# Patient Record
Sex: Female | Born: 1965 | Race: Black or African American | Hispanic: No | Marital: Married | Smoking: Former smoker
Health system: Southern US, Community
[De-identification: ages and names within clinical notes are randomized; demographics above are authoritative.]

## PROBLEM LIST (undated history)

## (undated) DIAGNOSIS — J45909 Unspecified asthma, uncomplicated: Secondary | ICD-10-CM

## (undated) DIAGNOSIS — E119 Type 2 diabetes mellitus without complications: Secondary | ICD-10-CM

## (undated) DIAGNOSIS — G4730 Sleep apnea, unspecified: Secondary | ICD-10-CM

## (undated) DIAGNOSIS — I10 Essential (primary) hypertension: Secondary | ICD-10-CM

## (undated) DIAGNOSIS — I509 Heart failure, unspecified: Secondary | ICD-10-CM

## (undated) DIAGNOSIS — R05 Cough: Secondary | ICD-10-CM

## (undated) DIAGNOSIS — Z9889 Other specified postprocedural states: Secondary | ICD-10-CM

## (undated) DIAGNOSIS — Z720 Tobacco use: Secondary | ICD-10-CM

## (undated) DIAGNOSIS — K219 Gastro-esophageal reflux disease without esophagitis: Secondary | ICD-10-CM

## (undated) DIAGNOSIS — F1410 Cocaine abuse, uncomplicated: Secondary | ICD-10-CM

## (undated) DIAGNOSIS — J189 Pneumonia, unspecified organism: Secondary | ICD-10-CM

## (undated) DIAGNOSIS — R058 Other specified cough: Secondary | ICD-10-CM

## (undated) DIAGNOSIS — Z9989 Dependence on other enabling machines and devices: Secondary | ICD-10-CM

## (undated) DIAGNOSIS — J101 Influenza due to other identified influenza virus with other respiratory manifestations: Secondary | ICD-10-CM

## (undated) DIAGNOSIS — R0600 Dyspnea, unspecified: Secondary | ICD-10-CM

## (undated) DIAGNOSIS — M1990 Unspecified osteoarthritis, unspecified site: Secondary | ICD-10-CM

## (undated) DIAGNOSIS — G7281 Critical illness myopathy: Secondary | ICD-10-CM

## (undated) DIAGNOSIS — G4733 Obstructive sleep apnea (adult) (pediatric): Secondary | ICD-10-CM

## (undated) HISTORY — DX: Essential (primary) hypertension: I10

## (undated) HISTORY — DX: Influenza due to other identified influenza virus with other respiratory manifestations: J10.1

## (undated) HISTORY — PX: LACERATION REPAIR: SHX5168

## (undated) HISTORY — DX: Critical illness myopathy: G72.81

---
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
## 2004-10-24 HISTORY — PX: TUBAL LIGATION: SHX77

## 2011-03-26 ENCOUNTER — Emergency Department (HOSPITAL_BASED_OUTPATIENT_CLINIC_OR_DEPARTMENT_OTHER)
Admission: EM | Admit: 2011-03-26 | Discharge: 2011-03-26 | Disposition: A | Discharge status: Home / Self Care | Payer: Self-pay | Attending: Emergency Medicine | Admitting: Emergency Medicine

## 2011-03-26 DIAGNOSIS — I8390 Asymptomatic varicose veins of unspecified lower extremity: Secondary | ICD-10-CM | POA: Insufficient documentation

## 2011-03-26 DIAGNOSIS — F17200 Nicotine dependence, unspecified, uncomplicated: Secondary | ICD-10-CM | POA: Insufficient documentation

## 2011-12-26 ENCOUNTER — Encounter (HOSPITAL_BASED_OUTPATIENT_CLINIC_OR_DEPARTMENT_OTHER): Payer: Self-pay | Admitting: *Deleted

## 2011-12-26 ENCOUNTER — Emergency Department (INDEPENDENT_AMBULATORY_CARE_PROVIDER_SITE_OTHER): Payer: Self-pay

## 2011-12-26 ENCOUNTER — Emergency Department (HOSPITAL_BASED_OUTPATIENT_CLINIC_OR_DEPARTMENT_OTHER)
Admission: EM | Admit: 2011-12-26 | Discharge: 2011-12-26 | Disposition: A | Discharge status: Home / Self Care | Payer: Self-pay | Attending: Emergency Medicine | Admitting: Emergency Medicine

## 2011-12-26 DIAGNOSIS — M25469 Effusion, unspecified knee: Secondary | ICD-10-CM | POA: Insufficient documentation

## 2011-12-26 DIAGNOSIS — F17200 Nicotine dependence, unspecified, uncomplicated: Secondary | ICD-10-CM | POA: Insufficient documentation

## 2011-12-26 DIAGNOSIS — M7989 Other specified soft tissue disorders: Secondary | ICD-10-CM

## 2011-12-26 DIAGNOSIS — M25569 Pain in unspecified knee: Secondary | ICD-10-CM

## 2011-12-26 DIAGNOSIS — M1710 Unilateral primary osteoarthritis, unspecified knee: Secondary | ICD-10-CM

## 2011-12-26 DIAGNOSIS — M1990 Unspecified osteoarthritis, unspecified site: Secondary | ICD-10-CM | POA: Insufficient documentation

## 2011-12-26 DIAGNOSIS — Z8739 Personal history of other diseases of the musculoskeletal system and connective tissue: Secondary | ICD-10-CM | POA: Insufficient documentation

## 2011-12-26 HISTORY — DX: Unspecified osteoarthritis, unspecified site: M19.90

## 2011-12-26 MED ORDER — ONDANSETRON 8 MG PO TBDP
8.0000 mg | ORAL_TABLET | Freq: Once | ORAL | Status: AC
  Administered 2011-12-26: 8 mg via ORAL
  Filled 2011-12-26: qty 1

## 2011-12-26 MED ORDER — HYDROCODONE-ACETAMINOPHEN 5-325 MG PO TABS
1.0000 | ORAL_TABLET | Freq: Once | ORAL | Status: AC
  Administered 2011-12-26: 1 via ORAL
  Filled 2011-12-26: qty 1

## 2011-12-26 MED ORDER — KETOROLAC TROMETHAMINE 60 MG/2ML IM SOLN
60.0000 mg | Freq: Once | INTRAMUSCULAR | Status: AC
  Administered 2011-12-26: 60 mg via INTRAMUSCULAR
  Filled 2011-12-26: qty 2

## 2011-12-26 MED ORDER — MELOXICAM 7.5 MG PO TABS: 7.5000 mg | ORAL_TABLET | Freq: Every day | ORAL | Status: DC

## 2011-12-26 MED ORDER — OXYCODONE-ACETAMINOPHEN 5-325 MG PO TABS: 1.0000 | ORAL_TABLET | Freq: Four times a day (QID) | ORAL | Status: AC | PRN

## 2011-12-26 NOTE — ED Notes (Signed)
XL Knee sleeve was too tight for patient comfort; patient was placed in ACE wrap.  Per patient request XL knee sleeve given to the patient to use at home later if needed.

## 2011-12-26 NOTE — ED Provider Notes (Signed)
History     CSN: [id number]  Arrival date & time 12/26/11  4098   First MD Initiated Contact with Patient 12/26/11 [phone]      Chief Complaint  Patient presents with  . Knee Pain    (Consider location/radiation/quality/duration/timing/severity/associated sxs/prior treatment) Patient is a 46 y.o. female presenting with knee pain. The history is provided by the patient.  Knee Pain This is a recurrent problem. Episode onset: 4 days ago. The problem occurs constantly (Pain is now an 8/10 sharp in nature radiates up the leg. It is worse in the medial part of the knee. Denies trauma). The problem has been gradually worsening. Associated symptoms comments: States she has had knee pain in the past requiring injections and it always hurts her some discomfort much worse the last 4 days with swelling. The symptoms are aggravated by walking, standing and bending. The symptoms are relieved by ice, relaxation and acetaminophen. She has tried acetaminophen for the symptoms. The treatment provided mild relief.    Past Medical History  Diagnosis Date  . Arthritis     History reviewed. No pertinent past surgical history.  History reviewed. No pertinent family history.  History  Substance Use Topics  . Smoking status: Current Everyday Smoker  . Smokeless tobacco: Never Used  . Alcohol Use: No    OB History    Grav Para Term Preterm Abortions TAB SAB Ect Mult Living                  Review of Systems  All other systems reviewed and are negative.    Allergies  Review of patient's allergies indicates no known allergies.  Home Medications   Current Outpatient Rx  Name Route Sig Dispense Refill  . ONE-DAILY MULTI VITAMINS PO TABS Oral Take 1 tablet by mouth daily.      BP 136/81  Pulse 98  Temp(Src) 97.7 F (36.5 C) (Oral)  Resp 20  SpO2 100%  LMP 12/13/2011  Physical Exam  Constitutional: She is oriented to person, place, and time. She appears well-developed and  well-nourished. No distress.  HENT:  Head: Normocephalic and atraumatic.  Eyes: EOM are normal. Pupils are equal, round, and reactive to light.  Musculoskeletal:       Right knee: She exhibits effusion. She exhibits normal range of motion, no erythema, no LCL laxity and no MCL laxity. tenderness found. Medial joint line tenderness noted.       Legs: Neurological: She is alert and oriented to person, place, and time. She has normal strength. No sensory deficit. Coordination normal.    ED Course  Procedures (including critical care time)  Labs Reviewed - No data to display Dg Knee Complete 4 Views Right  12/26/2011  *RADIOLOGY REPORT*  Clinical Data: Right knee pain and swelling.  Decreased range of motion.  RIGHT KNEE - COMPLETE 4+ VIEW  Comparison: None.  Findings: Mild lateral subluxation of the tibia with respect to the distal femur.  Prominent tricompartment osteophytosis.  The lateral views are somewhat rotated, limiting evaluation for joint effusion. There may be a small joint effusion.  Medial compartment narrowing.  IMPRESSION:  1.  No fracture. 2.  Possible small joint effusion.  Lateral views are suboptimal, as above. 3.  Moderate osteoarthritis.  Original Report Authenticated By: Reyes Ivan, M.D.     1. Osteoarthritis   2. Knee effusion       MDM   Patient with knee pain consistent with arthritis. There is a small effusion exam  medially. Patient in the past has had injections and therapy for her knee and was told at that time she did not require a replacement however she states she has increased weight and her pain is worsened particularly over the last 4 days without injury. There is no sign of septic joint exam or cellulitis. Plain film shows moderate osteoarthritis with small joint effusion which I feel is most likely the cause for her pain. Will refer her to orthopedics.       Gwyneth Sprout, MD 12/26/11 1409

## 2011-12-26 NOTE — Discharge Instructions (Signed)
Degenerative Arthritis You have osteoarthritis. This is the wear and tear arthritis that comes with aging. It is also called degenerative arthritis. This is common in people past middle age. It is caused by stress on the joints. The large weight bearing joints of the lower extremities are most often affected. The knees, hips, back, neck, and hands can become painful, swollen, and stiff. This is the most common type of arthritis. It comes on with age, carrying too much weight, or from an injury. Treatment includes resting the sore joint until the pain and swelling improve. Crutches or a walker may be needed for severe flares. Only take over-the-counter or prescription medicines for pain, discomfort, or fever as directed by your caregiver. Local heat therapy may improve motion. Cortisone shots into the joint are sometimes used to reduce pain and swelling during flares. Osteoarthritis is usually not crippling and progresses slowly. There are things you can do to decrease pain:  Avoid high impact activities.   Exercise regularly.   Low impact exercises such as walking, biking and swimming help to keep the muscles strong and keep normal joint function.   Stretching helps to keep your range of motion.   Lose weight if you are overweight. This reduces joint stress.  In severe cases when you have pain at rest or increasing disability, joint surgery may be helpful. See your caregiver for follow-up treatment as recommended.  SEEK IMMEDIATE MEDICAL CARE IF:   You have severe joint pain.   Marked swelling and redness in your joint develops.   You develop a high fever.  Document Released: 10/10/2005 Document Revised: 09/29/2011 Document Reviewed: 03/12/2007 [facility] Patient Information 2012 [place], [place].Knee Effusion The medical term for having fluid in your knee is effusion. This is often due to an internal derangement of the knee. This means something is wrong inside the knee. Some of the causes of  fluid in the knee may be torn cartilage, a torn ligament, or bleeding into the joint from an injury. Your knee is likely more difficult to bend and move. This is often because there is increased pain and pressure in the joint. The time it takes for recovery from a knee effusion depends on different factors, including:   Type of injury.   Your age.   Physical and medical conditions.   Rehabilitation Strategies.  How long you will be away from your normal activities will depend on what kind of knee problem you have and how much damage is present. Your knee has two types of cartilage. Articular cartilage covers the bone ends and lets your knee bend and move smoothly. Two menisci, thick pads of cartilage that form a rim inside the joint, help absorb shock and stabilize your knee. Ligaments bind the bones together and support your knee joint. Muscles move the joint, help support your knee, and take stress off the joint itself. CAUSES  Often an effusion in the knee is caused by an injury to one of the menisci. This is often a tear in the cartilage. Recovery after a meniscus injury depends on how much meniscus is damaged and whether you have damaged other knee tissue. Small tears may heal on their own with conservative treatment. Conservative means rest, limited weight bearing activity and muscle strengthening exercises. Your recovery may take up to 6 weeks.  TREATMENT  Larger tears may require surgery. Meniscus injuries may be treated during arthroscopy. Arthroscopy is a procedure in which your surgeon uses a small telescope like instrument to look in your  knee. Your caregiver can make a more accurate diagnosis (learning what is wrong) by performing an arthroscopic procedure. If your injury is on the inner margin of the meniscus, your surgeon may trim the meniscus back to a smooth rim. In other cases your surgeon will try to repair a damaged meniscus with stitches (sutures). This may make rehabilitation take  longer, but may provide better long term result by helping your knee keep its shock absorption capabilities. Ligaments which are completely torn usually require surgery for repair. HOME CARE INSTRUCTIONS  Use crutches as instructed.   If a brace is applied, use as directed.   Once you are home, an ice pack applied to your swollen knee may help with discomfort and help decrease swelling.   Keep your knee raised (elevated) when you are not up and around or on crutches.   Only take over-the-counter or prescription medicines for pain, discomfort, or fever as directed by your caregiver.   Your caregivers will help with instructions for rehabilitation of your knee. This often includes strengthening exercises.   You may resume a normal diet and activities as directed.  SEEK MEDICAL CARE IF:   There is increased swelling in your knee.   You notice redness, swelling, or increasing pain in your knee.   An unexplained oral temperature above 102 F (38.9 C) develops.  SEEK IMMEDIATE MEDICAL CARE IF:   You develop a rash.   You have difficulty breathing.   You have any allergic reactions from medications you may have been given.   There is severe pain with any motion of the knee.  MAKE SURE YOU:   Understand these instructions.   Will watch your condition.   Will get help right away if you are not doing well or get worse.  Document Released: 12/31/2003 Document Revised: 09/29/2011 Document Reviewed: 03/05/2008 [facility] Patient Information 2012 [place], [place].

## 2011-12-26 NOTE — ED Notes (Signed)
Pt reports having arthritis in knees but has been having trouble with her right knee for 4 days and it feels different than the arthritis unaware if she has injured it reports sharp shooting pains mainly down outside of knee painful to bend or put weight on

## 2012-05-31 ENCOUNTER — Emergency Department (HOSPITAL_BASED_OUTPATIENT_CLINIC_OR_DEPARTMENT_OTHER)
Admission: EM | Admit: 2012-05-31 | Discharge: 2012-05-31 | Disposition: A | Discharge status: Home / Self Care | Payer: Self-pay | Attending: Emergency Medicine | Admitting: Emergency Medicine

## 2012-05-31 ENCOUNTER — Encounter (HOSPITAL_BASED_OUTPATIENT_CLINIC_OR_DEPARTMENT_OTHER): Payer: Self-pay

## 2012-05-31 ENCOUNTER — Emergency Department (HOSPITAL_BASED_OUTPATIENT_CLINIC_OR_DEPARTMENT_OTHER): Payer: Self-pay

## 2012-05-31 DIAGNOSIS — M129 Arthropathy, unspecified: Secondary | ICD-10-CM | POA: Insufficient documentation

## 2012-05-31 DIAGNOSIS — W010XXA Fall on same level from slipping, tripping and stumbling without subsequent striking against object, initial encounter: Secondary | ICD-10-CM | POA: Insufficient documentation

## 2012-05-31 DIAGNOSIS — S8001XA Contusion of right knee, initial encounter: Secondary | ICD-10-CM

## 2012-05-31 DIAGNOSIS — S8000XA Contusion of unspecified knee, initial encounter: Secondary | ICD-10-CM | POA: Insufficient documentation

## 2012-05-31 DIAGNOSIS — F17200 Nicotine dependence, unspecified, uncomplicated: Secondary | ICD-10-CM | POA: Insufficient documentation

## 2012-05-31 MED ORDER — NAPROXEN 500 MG PO TABS: 500.0000 mg | ORAL_TABLET | Freq: Two times a day (BID) | ORAL | Status: DC

## 2012-05-31 MED ORDER — KETOROLAC TROMETHAMINE 60 MG/2ML IM SOLN
60.0000 mg | Freq: Once | INTRAMUSCULAR | Status: AC
  Administered 2012-05-31: 60 mg via INTRAMUSCULAR
  Filled 2012-05-31: qty 2

## 2012-05-31 MED ORDER — TRAMADOL HCL 50 MG PO TABS: 50.0000 mg | ORAL_TABLET | Freq: Four times a day (QID) | ORAL | Status: AC | PRN

## 2012-05-31 NOTE — ED Notes (Signed)
Pt to room via wheelchair. Pt wants to remain in WC. Pt given ice and ginger ale per pt request.

## 2012-05-31 NOTE — ED Provider Notes (Addendum)
History     CSN: [id number]  Arrival date & time 05/31/12  2100   First MD Initiated Contact with Patient 05/31/12 2139      Chief Complaint  Patient presents with  . Knee Injury    (Consider location/radiation/quality/duration/timing/severity/associated sxs/prior treatment) HPI Comments: The patient presents with her family members several hours after a fall when she tripped and fell onto her right knee. This was acute in onset, persistent pain, associated with mild swelling, worse with ambulation. She denies head injury neck pain, back pain.  The history is provided by the patient.    Past Medical History  Diagnosis Date  . Arthritis     History reviewed. No pertinent past surgical history.  No family history on file.  History  Substance Use Topics  . Smoking status: Current Everyday Smoker  . Smokeless tobacco: Never Used  . Alcohol Use: No    OB History    Grav Para Term Preterm Abortions TAB SAB Ect Mult Living                  Review of Systems  HENT: Negative for neck pain.   Gastrointestinal: Negative for nausea and vomiting.  Musculoskeletal: Positive for joint swelling (Right knee). Negative for back pain.  Neurological: Negative for weakness and numbness.    Allergies  Review of patient's allergies indicates no known allergies.  Home Medications   Current Outpatient Rx  Name Route Sig Dispense Refill  . NAPROXEN 500 MG PO TABS Oral Take 1 tablet (500 mg total) by mouth 2 (two) times daily with a meal. 30 tablet 0  . TRAMADOL HCL 50 MG PO TABS Oral Take 1 tablet (50 mg total) by mouth every 6 (six) hours as needed for pain. 15 tablet 0    BP 149/86  Pulse 112  Temp 97.1 F (36.2 C) (Oral)  Resp 20  Ht 5\' 5"  (1.651 m)  Wt 273 lb (123.832 kg)  BMI 45.43 kg/m2  SpO2 98%  LMP 05/30/2012  Physical Exam  Nursing note and vitals reviewed. Constitutional: She appears well-developed and well-nourished. No distress.  HENT:  Head: Normocephalic  and atraumatic.  Eyes: Conjunctivae are normal. No scleral icterus.  Cardiovascular: Normal rate, regular rhythm and intact distal pulses.   Pulmonary/Chest: Effort normal and breath sounds normal.  Musculoskeletal: She exhibits tenderness ( Tenderness over the right patella, pain with range of motion, mild effusion, normal range of motion of the ankle and hip on the right. No other injured joints ). She exhibits no edema.  Neurological: She is alert.       Normal sensation of the right lower extremity to light touch  Skin: Skin is warm and dry. No rash noted. She is not diaphoretic.    ED Course  Procedures (including critical care time)  Labs Reviewed - No data to display Dg Knee Complete 4 Views Right  05/31/2012  *RADIOLOGY REPORT*  Clinical Data: Anterior right knee pain after fall.  RIGHT KNEE - COMPLETE 4+ VIEW  Comparison: 12/26/2011  Findings: Tricompartmental degenerative changes in the right knee, most prominent in the medial compartment.  There is tricompartmental narrowing and sclerosis with prominent osteophytic change.  There is mild associated lateral subluxation of the tibia with respect to the femur, likely due to degenerative change.  No evidence of acute fracture or subluxation.  Suggestion of a small effusion.  Bone cortex and trabecular architecture appear intact. No focal bone lesion or bone destruction.  Vascular calcifications. Stable appearance  since previous study.  IMPRESSION: Prominent tricompartmental degenerative changes in the right knee with possible small effusion.  No acute fractures demonstrated.  Original Report Authenticated By: Marlon Pel, M.D.     1. Contusion of right knee       MDM  X-rays reviewed, mild effusion, no fracture, tricompartmental degenerative change present, patient informed of these results, intramuscular Toradol, home with Naprosyn and Ultram.        Vida Roller, MD 05/31/12 1610  Vida Roller, MD 05/31/12  [phone]

## 2012-05-31 NOTE — ED Notes (Signed)
MD at bedside. 

## 2012-05-31 NOTE — ED Notes (Signed)
Tripped/fell approx 2 hours PTA-pain to right knee

## 2012-06-29 ENCOUNTER — Emergency Department (HOSPITAL_BASED_OUTPATIENT_CLINIC_OR_DEPARTMENT_OTHER): Payer: No Typology Code available for payment source

## 2012-06-29 ENCOUNTER — Emergency Department (HOSPITAL_BASED_OUTPATIENT_CLINIC_OR_DEPARTMENT_OTHER)
Admission: EM | Admit: 2012-06-29 | Discharge: 2012-06-30 | Disposition: A | Discharge status: Home / Self Care | Payer: No Typology Code available for payment source | Attending: Emergency Medicine | Admitting: Emergency Medicine

## 2012-06-29 ENCOUNTER — Encounter (HOSPITAL_BASED_OUTPATIENT_CLINIC_OR_DEPARTMENT_OTHER): Payer: Self-pay | Admitting: *Deleted

## 2012-06-29 DIAGNOSIS — M129 Arthropathy, unspecified: Secondary | ICD-10-CM | POA: Insufficient documentation

## 2012-06-29 DIAGNOSIS — F17200 Nicotine dependence, unspecified, uncomplicated: Secondary | ICD-10-CM | POA: Insufficient documentation

## 2012-06-29 DIAGNOSIS — M542 Cervicalgia: Secondary | ICD-10-CM | POA: Insufficient documentation

## 2012-06-29 DIAGNOSIS — M549 Dorsalgia, unspecified: Secondary | ICD-10-CM | POA: Insufficient documentation

## 2012-06-29 DIAGNOSIS — M25569 Pain in unspecified knee: Secondary | ICD-10-CM | POA: Insufficient documentation

## 2012-06-29 MED ORDER — KETOROLAC TROMETHAMINE 60 MG/2ML IM SOLN
60.0000 mg | Freq: Once | INTRAMUSCULAR | Status: AC
  Administered 2012-06-29: 60 mg via INTRAMUSCULAR
  Filled 2012-06-29: qty 2

## 2012-06-29 MED ORDER — OXYCODONE-ACETAMINOPHEN 5-325 MG PO TABS
2.0000 | ORAL_TABLET | Freq: Once | ORAL | Status: DC
  Filled 2012-06-29: qty 2

## 2012-06-29 MED ORDER — KETOROLAC TROMETHAMINE 30 MG/ML IJ SOLN: 30.0000 mg | Freq: Once | INTRAMUSCULAR | Status: DC

## 2012-06-29 NOTE — ED Notes (Signed)
Pt c/o fall from standing on tile floor injuring right knee and lower back

## 2012-06-29 NOTE — ED Notes (Signed)
Pt here for right knee pain and low back pain s/p fall. Pt has been seen here before for injury to same knee. Pt denies any head injury. Denies LOC.

## 2012-06-29 NOTE — ED Provider Notes (Signed)
History     CSN: [id number]  Arrival date & time 06/29/12  4098   First MD Initiated Contact with Patient 06/29/12 2239      Chief Complaint  Patient presents with  . Fall    (Consider location/radiation/quality/duration/timing/severity/associated sxs/prior treatment) The history is provided by the patient and medical records.   Kathleen Rodriguez is a 46 y.o. female presents to the emergency department complaining of pain after fall.  The onset of the symptoms was  abrupt starting 4 hours ago.  The patient has associated pain in her right knee, pain in her lower back, pain in her neck.  The symptoms have been  constant, gradually worsened.  Movement and palpation makes the symptoms worse and nothing makes symptoms better.  The patient denies fever, chills, hitting her head, loss of consciousness, nausea, vomiting, diarrhea.  Patient states she slipped on the floor and her flip-flops and caught herself before falling, however she twisted her right knee and has pain in her neck and back. She states his a history of arthritis but this is not the same as her arthritis pain. She has been to the emergency department the same complaints in the past.  She describes the pain as sharp nonradiating.  She denies numbness and tingling of her feet or toes.   Past Medical History  Diagnosis Date  . Arthritis     History reviewed. No pertinent past surgical history.  History reviewed. No pertinent family history.  History  Substance Use Topics  . Smoking status: Current Everyday Smoker  . Smokeless tobacco: Never Used  . Alcohol Use: No    OB History    Grav Para Term Preterm Abortions TAB SAB Ect Mult Living                  Review of Systems  Constitutional: Negative for fever and fatigue.  HENT: Negative for neck pain and neck stiffness.   Respiratory: Negative for chest tightness and shortness of breath.   Cardiovascular: Negative for chest pain.  Gastrointestinal: Negative for nausea,  vomiting, abdominal pain and diarrhea.  Genitourinary: Negative for dysuria, urgency, frequency and hematuria.  Musculoskeletal: Positive for back pain, joint swelling (Right knee) and gait problem (secondary to pain).  Skin: Negative for rash.  Neurological: Negative for weakness, light-headedness, numbness and headaches.    Allergies  Other and Tomato  Home Medications  No current outpatient prescriptions on file.  BP 132/72  Pulse 95  Temp 98.3 F (36.8 C) (Oral)  Resp 16  Ht 5\' 6"  (1.676 m)  Wt 25 lb (11.34 kg)  BMI 4.04 kg/m2  SpO2 100%  LMP 06/24/2012  Physical Exam  Nursing note and vitals reviewed. Constitutional: She appears well-developed and well-nourished. No distress.  HENT:  Head: Normocephalic and atraumatic.  Mouth/Throat: Oropharynx is clear and moist. No oropharyngeal exudate.  Eyes: Conjunctivae and EOM are normal. Pupils are equal, round, and reactive to light. No scleral icterus.  Neck: Normal range of motion. Neck supple.       Full range of motion with pain  Cardiovascular: Normal rate, regular rhythm, normal heart sounds and intact distal pulses.  Exam reveals no gallop and no friction rub.   No murmur heard.      Capillary refill less than 3 seconds  Pulmonary/Chest: Effort normal and breath sounds normal. No respiratory distress. She has no wheezes.  Abdominal: Soft. Bowel sounds are normal. She exhibits no mass. There is no tenderness. There is no rebound and no  guarding.  Musculoskeletal: Normal range of motion. She exhibits no edema.       Decreased range of motion secondary to pain in the L-spine area  Neurological: She is alert.       Speech is clear and goal oriented, follows commands Major Cranial nerves without deficit, no facial droop Normal strength in upper and lower extremities bilaterally including dorsiflexion and plantar flexion, strong and equal grip strength Sensation normal to light and sharp touch Moves extremities without  ataxia, coordination intact Normal gait with pain   Skin: Skin is warm and dry. She is not diaphoretic.  Psychiatric: She has a normal mood and affect.    ED Course  Procedures (including critical care time)  Labs Reviewed - No data to display Dg Cervical Spine Complete  06/29/2012  *RADIOLOGY REPORT*  Clinical Data: Fall.  Neck injury and pain.  CERVICAL SPINE - COMPLETE 4+ VIEW  Comparison: None.  Findings: No evidence of acute fracture, subluxation, or prevertebral soft tissue swelling.  Moderate degenerative disc disease is seen at levels of C4-5 and C5- 6.  Mild cervical kyphosis also noted.  No other significant bone abnormality identified.  IMPRESSION:  1.  No acute findings. 2.  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   Original Report Authenticated By: Danae Orleans, M.D.    Dg Lumbar Spine Complete  06/29/2012  *RADIOLOGY REPORT*  Clinical Data: Fall.  Back injury and pain.  LUMBAR SPINE - COMPLETE 4+ VIEW  Comparison:  None.  Findings:  There is no evidence of lumbar spine fracture. Alignment is normal.  Intervertebral disc spaces are maintained.  IMPRESSION: Negative.   Original Report Authenticated By: Danae Orleans, M.D.    Dg Knee Complete 4 Views Right  06/29/2012  *RADIOLOGY REPORT*  Clinical Data: Fall.  Knee injury and pain.  RIGHT KNEE - COMPLETE 4+ VIEW  Comparison: 05/31/2012  Findings: No evidence of fracture or dislocation.  No evidence of knee joint effusion.  Moderate tricompartmental osteoarthritis shows no significant change in appearance.  No other bone lesions identified.  IMPRESSION:  1.  No acute findings. 2.  Moderate tricompartmental osteoarthritis.   Original Report Authenticated By: Danae Orleans, M.D.      1. Back pain   2. Neck pain   3. Knee pain       MDM  [name] presents with pain after a slip and fall. Neurovascularly intact.  Patient with back pain.  No neurological deficits and normal neuro exam.   Patient can walk but states is painful.  No loss of bowel or bladder control.  No concern for cauda equina.  No fever, night sweats, weight loss, h/o cancer, IVDU.  X-rays without evidence of fracture or dislocation.  Moderate tricompartmental osteoarthritis of the R knee.  RICE protocol and pain medicine indicated and discussed with patient.   1. Medications: Naprosyn, external 2. Treatment: Rest, ice, stretching 3. Follow Up: Primary care physician; emergency department if loss of bowel or bladder function or inability to walk.         Dahlia Client Olyvia Gopal, PA-C 06/30/12 1432

## 2012-06-30 MED ORDER — CYCLOBENZAPRINE HCL 10 MG PO TABS: 10.0000 mg | ORAL_TABLET | Freq: Two times a day (BID) | ORAL | Status: AC | PRN

## 2012-06-30 MED ORDER — NAPROXEN 500 MG PO TABS: 500.0000 mg | ORAL_TABLET | Freq: Two times a day (BID) | ORAL | Status: DC

## 2012-06-30 NOTE — ED Provider Notes (Signed)
Medical screening examination/treatment/procedure(s) were performed by non-physician practitioner and as supervising physician I was immediately available for consultation/collaboration.   Hanley Seamen, MD 06/30/12 [phone]

## 2012-10-26 ENCOUNTER — Encounter (HOSPITAL_BASED_OUTPATIENT_CLINIC_OR_DEPARTMENT_OTHER): Payer: Self-pay | Admitting: *Deleted

## 2012-10-26 ENCOUNTER — Emergency Department (HOSPITAL_BASED_OUTPATIENT_CLINIC_OR_DEPARTMENT_OTHER)
Admission: EM | Admit: 2012-10-26 | Discharge: 2012-10-26 | Disposition: A | Discharge status: Home / Self Care | Payer: Managed Care, Other (non HMO) | Attending: Emergency Medicine | Admitting: Emergency Medicine

## 2012-10-26 DIAGNOSIS — Z8739 Personal history of other diseases of the musculoskeletal system and connective tissue: Secondary | ICD-10-CM | POA: Insufficient documentation

## 2012-10-26 DIAGNOSIS — J45901 Unspecified asthma with (acute) exacerbation: Secondary | ICD-10-CM | POA: Insufficient documentation

## 2012-10-26 DIAGNOSIS — Z87891 Personal history of nicotine dependence: Secondary | ICD-10-CM | POA: Insufficient documentation

## 2012-10-26 DIAGNOSIS — R059 Cough, unspecified: Secondary | ICD-10-CM | POA: Insufficient documentation

## 2012-10-26 DIAGNOSIS — R05 Cough: Secondary | ICD-10-CM | POA: Insufficient documentation

## 2012-10-26 DIAGNOSIS — J069 Acute upper respiratory infection, unspecified: Secondary | ICD-10-CM | POA: Insufficient documentation

## 2012-10-26 DIAGNOSIS — Z79899 Other long term (current) drug therapy: Secondary | ICD-10-CM | POA: Insufficient documentation

## 2012-10-26 HISTORY — DX: Unspecified asthma, uncomplicated: J45.909

## 2012-10-26 MED ORDER — ALBUTEROL SULFATE (5 MG/ML) 0.5% IN NEBU: 2.5000 mg | INHALATION_SOLUTION | Freq: Once | RESPIRATORY_TRACT | Status: DC

## 2012-10-26 MED ORDER — ALBUTEROL SULFATE (5 MG/ML) 0.5% IN NEBU
INHALATION_SOLUTION | RESPIRATORY_TRACT | Status: AC
  Filled 2012-10-26: qty 1

## 2012-10-26 MED ORDER — ACETAMINOPHEN 325 MG PO TABS
ORAL_TABLET | ORAL | Status: AC
  Administered 2012-10-26: 975 mg via ORAL
  Filled 2012-10-26: qty 3

## 2012-10-26 MED ORDER — PREDNISONE 10 MG PO TABS
ORAL_TABLET | ORAL | Status: AC
  Administered 2012-10-26: 60 mg
  Filled 2012-10-26: qty 1

## 2012-10-26 MED ORDER — PREDNISONE 50 MG PO TABS: 60.0000 mg | ORAL_TABLET | Freq: Once | ORAL | Status: DC

## 2012-10-26 MED ORDER — ALBUTEROL SULFATE (5 MG/ML) 0.5% IN NEBU
5.0000 mg | INHALATION_SOLUTION | Freq: Once | RESPIRATORY_TRACT | Status: AC
  Administered 2012-10-26: 5 mg via RESPIRATORY_TRACT

## 2012-10-26 MED ORDER — ALBUTEROL SULFATE HFA 108 (90 BASE) MCG/ACT IN AERS
2.0000 | INHALATION_SPRAY | RESPIRATORY_TRACT | Status: DC | PRN
  Administered 2012-10-26: 2 via RESPIRATORY_TRACT

## 2012-10-26 MED ORDER — AEROCHAMBER PLUS FLO-VU MEDIUM MISC
1.0000 | Freq: Once | Status: DC
  Filled 2012-10-26: qty 1

## 2012-10-26 MED ORDER — ALBUTEROL SULFATE HFA 108 (90 BASE) MCG/ACT IN AERS
INHALATION_SPRAY | RESPIRATORY_TRACT | Status: AC
  Administered 2012-10-26: 2 via RESPIRATORY_TRACT
  Filled 2012-10-26: qty 6.7

## 2012-10-26 MED ORDER — ACETAMINOPHEN 325 MG PO TABS
975.0000 mg | ORAL_TABLET | Freq: Once | ORAL | Status: AC
  Administered 2012-10-26: 975 mg via ORAL

## 2012-10-26 MED ORDER — PREDNISONE 10 MG PO TABS: 40.0000 mg | ORAL_TABLET | Freq: Every day | ORAL | Status: DC

## 2012-10-26 MED ORDER — PREDNISONE 50 MG PO TABS
ORAL_TABLET | ORAL | Status: AC
  Filled 2012-10-26: qty 1

## 2012-10-26 NOTE — ED Provider Notes (Signed)
History     CSN: [id number]  Arrival date & time 10/26/12  1444   First MD Initiated Contact with Patient 10/26/12 1458      Chief Complaint  Patient presents with  . Shortness of Breath    (Consider location/radiation/quality/duration/timing/severity/associated sxs/prior treatment) HPI Complains of shortness of breath typical of asthma onset 12 hours ago also developed nonproductive cough twice 3 hours ago, no fever admits to "my face hurts when I cough" no fever no other complaints treated with ibuprofen and Tylenol, without relief. No other associated symptoms. Patient states she lost her inhaler Past Medical History  Diagnosis Date  . Arthritis   . Asthma     History reviewed. No pertinent past surgical history.  No family history on file.  History  Substance Use Topics  . Smoking status: Former Games developer  . Smokeless tobacco: Never Used  . Alcohol Use: No   quit smoking 3 weeks ago no alcohol no drugs  OB History    Grav Para Term Preterm Abortions TAB SAB Ect Mult Living                  Review of Systems  Constitutional: Negative.   HENT: Negative.        Facial pain  Respiratory: Positive for cough, shortness of breath and wheezing.   Cardiovascular: Negative.   Gastrointestinal: Negative.   Musculoskeletal: Negative.   Skin: Negative.   Neurological: Negative.   Hematological: Negative.   Psychiatric/Behavioral: Negative.   All other systems reviewed and are negative.    Allergies  Other and Tomato  Home Medications   Current Outpatient Rx  Name  Route  Sig  Dispense  Refill  . NAPROXEN 500 MG PO TABS   Oral   Take 1 tablet (500 mg total) by mouth 2 (two) times daily with a meal.   30 tablet   0     BP 129/91  Pulse 116  Temp 98.2 F (36.8 C) (Oral)  Resp 26  Wt 260 lb (117.935 kg)  SpO2 98%  Physical Exam  Nursing note and vitals reviewed. Constitutional: She appears well-developed and well-nourished. No distress.  HENT:  Head:  Normocephalic and atraumatic.  Right Ear: External ear normal.  Left Ear: External ear normal.  Mouth/Throat: Oropharynx is clear and moist. No oropharyngeal exudate.       Bilateral tympanic membranes normal oropharynx normal  Eyes: Conjunctivae normal are normal. Pupils are equal, round, and reactive to light.  Neck: Neck supple. No tracheal deviation present. No thyromegaly present.  Cardiovascular: Normal rate and regular rhythm.   No murmur heard. Pulmonary/Chest: She has wheezes.       Mildly prolonged expiratory phase, occasional cough, expiratory wheezes, speaks in paragraphs  Abdominal: Soft. Bowel sounds are normal. She exhibits no distension. There is no tenderness.       obese  Musculoskeletal: Normal range of motion. She exhibits no edema and no tenderness.  Neurological: She is alert. Coordination normal.  Skin: Skin is warm and dry. No rash noted.  Psychiatric: She has a normal mood and affect.    ED Course  Procedures (including critical care time)  Labs Reviewed - No data to display No results found.   No diagnosis found.  3:25 PM breathing improved of treatment with albuterol nebulizer. Not at baseline Continues to complain of anterior pleuritic chest pain with cough.  On exam speaks in paragraphs mild and expiratory wheezes. Tylenol ordered for pain second nebulized treatment ordered, prednisone ordered  415 p.m. breathing feels normal patient feels improved and ready for home after treatment with a second albuterol nebulizer treatment, Tylenol and prednisone  MDM  Mild tachycardia on discharge felt secondary to adrenergic effect of albuterol Suspect URI as for asthma exacerbation Plan Tylenol for aches. Albuterol inhaler with spacer to go, prescription prednisone AvoidNSAIDs while on prednisone Diagnosis #1 URI #2 asthma exacerbation        Doug Sou, MD 10/26/12 4132

## 2012-10-26 NOTE — ED Notes (Signed)
Woke at 3am with difficulty breathing. Hx of asthma. Lost her inhaler.

## 2012-12-16 ENCOUNTER — Emergency Department (HOSPITAL_BASED_OUTPATIENT_CLINIC_OR_DEPARTMENT_OTHER)
Admission: EM | Admit: 2012-12-16 | Discharge: 2012-12-16 | Disposition: A | Discharge status: Home / Self Care | Payer: Managed Care, Other (non HMO) | Attending: Emergency Medicine | Admitting: Emergency Medicine

## 2012-12-16 ENCOUNTER — Emergency Department (HOSPITAL_BASED_OUTPATIENT_CLINIC_OR_DEPARTMENT_OTHER): Payer: Managed Care, Other (non HMO)

## 2012-12-16 ENCOUNTER — Encounter (HOSPITAL_BASED_OUTPATIENT_CLINIC_OR_DEPARTMENT_OTHER): Payer: Self-pay

## 2012-12-16 DIAGNOSIS — Z79899 Other long term (current) drug therapy: Secondary | ICD-10-CM | POA: Insufficient documentation

## 2012-12-16 DIAGNOSIS — Z8739 Personal history of other diseases of the musculoskeletal system and connective tissue: Secondary | ICD-10-CM | POA: Insufficient documentation

## 2012-12-16 DIAGNOSIS — J45901 Unspecified asthma with (acute) exacerbation: Secondary | ICD-10-CM

## 2012-12-16 MED ORDER — ALBUTEROL SULFATE (5 MG/ML) 0.5% IN NEBU
5.0000 mg | INHALATION_SOLUTION | Freq: Once | RESPIRATORY_TRACT | Status: AC
  Administered 2012-12-16: 5 mg via RESPIRATORY_TRACT
  Filled 2012-12-16: qty 1

## 2012-12-16 MED ORDER — PREDNISONE 10 MG PO TABS: 20.0000 mg | ORAL_TABLET | Freq: Two times a day (BID) | ORAL | Status: DC

## 2012-12-16 MED ORDER — MAGNESIUM SULFATE 50 % IJ SOLN
2.0000 g | Freq: Once | INTRAMUSCULAR | Status: AC
  Administered 2012-12-16: 2 g via INTRAVENOUS
  Filled 2012-12-16: qty 4

## 2012-12-16 MED ORDER — ALBUTEROL SULFATE (5 MG/ML) 0.5% IN NEBU: 5.0000 mg | INHALATION_SOLUTION | Freq: Once | RESPIRATORY_TRACT | Status: DC

## 2012-12-16 MED ORDER — SODIUM CHLORIDE 0.9 % IV SOLN
Freq: Once | INTRAVENOUS | Status: AC
  Administered 2012-12-16: 15:00:00 via INTRAVENOUS

## 2012-12-16 NOTE — ED Notes (Signed)
Per Medic with [facility], pt has been sob since yesterday.  Pt states that she has been using albuterol at home, typically needs prednisone to get over episodes like this.  Has been given 10 albuterol via neb, 0.5mg  atrovent via neb, and 125 solumedrol pta by medic. 20g PIV in L AC noted upon arrival.  Pt arrived with neb in place, pt with expiratory wheezing throughout all fields, states she feels better now than when ems first arrived.

## 2012-12-16 NOTE — ED Provider Notes (Signed)
History     CSN: [id number]  Arrival date & time 12/16/12  1343   First MD Initiated Contact with Patient 12/16/12 1351      Chief Complaint  Patient presents with  . Shortness of Breath    (Consider location/radiation/quality/duration/timing/severity/associated sxs/prior treatment) HPI Comments: Patient with history of RAD.  By ems for evaluation of wheezing, shortness of breath that started yesterday and has worsened.  Not much relief with home treatments.  Was given nebs and solumedrol by ems en route.  She says that she normally gets steroids when she gets this bad.  Patient is a 47 y.o. female presenting with shortness of breath. The history is provided by the patient.  Shortness of Breath Severity:  Moderate Onset quality:  Sudden Duration:  24 hours Timing:  Constant Progression:  Worsening Chronicity:  Recurrent Context: activity and strong odors   Relieved by:  Nothing Worsened by:  Nothing tried Ineffective treatments:  Inhaler Associated symptoms: wheezing   Associated symptoms: no chest pain, no cough and no fever     Past Medical History  Diagnosis Date  . Arthritis   . Asthma     History reviewed. No pertinent past surgical history.  History reviewed. No pertinent family history.  History  Substance Use Topics  . Smoking status: Former Games developer  . Smokeless tobacco: Never Used  . Alcohol Use: No    OB History   Grav Para Term Preterm Abortions TAB SAB Ect Mult Living                  Review of Systems  Constitutional: Negative for fever.  Respiratory: Positive for shortness of breath and wheezing. Negative for cough.   Cardiovascular: Negative for chest pain.  All other systems reviewed and are negative.    Allergies  Other and Tomato  Home Medications   Current Outpatient Rx  Name  Route  Sig  Dispense  Refill  . albuterol (PROVENTIL HFA;VENTOLIN HFA) 108 (90 BASE) MCG/ACT inhaler   Inhalation   Inhale 2 puffs into the lungs every  6 (six) hours as needed for wheezing.         . predniSONE (DELTASONE) 10 MG tablet   Oral   Take 4 tablets (40 mg total) by mouth daily.   16 tablet   0   . albuterol (PROVENTIL) (5 MG/ML) 0.5% nebulizer solution   Nebulization   Take 10 mg by nebulization once.         Marland Kitchen ipratropium (ATROVENT) 0.02 % nebulizer solution   Nebulization   Take 500 mcg by nebulization once.         . methylPREDNISolone sodium succinate (SOLU-MEDROL) 125 mg/2 mL injection   Intravenous   Inject 125 mg into the vein once.         . naproxen (NAPROSYN) 500 MG tablet   Oral   Take 1 tablet (500 mg total) by mouth 2 (two) times daily with a meal.   30 tablet   0     BP 149/80  Pulse 110  Resp 26  SpO2 96%  LMP 11/15/2012  Physical Exam  Nursing note and vitals reviewed. Constitutional: She is oriented to person, place, and time. She appears well-developed and well-nourished. No distress.  HENT:  Head: Normocephalic and atraumatic.  Neck: Normal range of motion. Neck supple.  Cardiovascular: Normal rate and regular rhythm.  Exam reveals no gallop and no friction rub.   No murmur heard. Pulmonary/Chest:  There is mild respiratory  distress.  Breath sounds are equal with bilateral expiratory wheezes present.    Abdominal: Soft. Bowel sounds are normal. She exhibits no distension. There is no tenderness.  Musculoskeletal: Normal range of motion.  Neurological: She is alert and oriented to person, place, and time.  Skin: Skin is warm and dry. She is not diaphoretic.    ED Course  Procedures (including critical care time)  Labs Reviewed - No data to display No results found.   No diagnosis found.    MDM  The patient presents with an exacerbation of her asthma.  She is feeling better, sounding better, and saturating in the upper 90's after nebs, steroids, and mag.  The xray does not show an infiltrate.  Will treat with prednisone, continued home nebs.        Geoffery Lyons, MD 12/16/12 [phone]

## 2013-01-22 DIAGNOSIS — G7281 Critical illness myopathy: Secondary | ICD-10-CM

## 2013-01-22 DIAGNOSIS — J101 Influenza due to other identified influenza virus with other respiratory manifestations: Secondary | ICD-10-CM

## 2013-01-22 HISTORY — DX: Critical illness myopathy: G72.81

## 2013-01-22 HISTORY — DX: Influenza due to other identified influenza virus with other respiratory manifestations: J10.1

## 2013-01-23 ENCOUNTER — Emergency Department (HOSPITAL_BASED_OUTPATIENT_CLINIC_OR_DEPARTMENT_OTHER)
Admission: EM | Admit: 2013-01-23 | Discharge: 2013-01-23 | Disposition: A | Discharge status: Home / Self Care | Payer: 59 | Attending: Emergency Medicine | Admitting: Emergency Medicine

## 2013-01-23 ENCOUNTER — Encounter (HOSPITAL_BASED_OUTPATIENT_CLINIC_OR_DEPARTMENT_OTHER): Payer: Self-pay | Admitting: *Deleted

## 2013-01-23 DIAGNOSIS — Z87891 Personal history of nicotine dependence: Secondary | ICD-10-CM | POA: Insufficient documentation

## 2013-01-23 DIAGNOSIS — K0889 Other specified disorders of teeth and supporting structures: Secondary | ICD-10-CM

## 2013-01-23 DIAGNOSIS — Z8739 Personal history of other diseases of the musculoskeletal system and connective tissue: Secondary | ICD-10-CM | POA: Insufficient documentation

## 2013-01-23 DIAGNOSIS — K089 Disorder of teeth and supporting structures, unspecified: Secondary | ICD-10-CM | POA: Insufficient documentation

## 2013-01-23 DIAGNOSIS — J45909 Unspecified asthma, uncomplicated: Secondary | ICD-10-CM | POA: Insufficient documentation

## 2013-01-23 DIAGNOSIS — Z79899 Other long term (current) drug therapy: Secondary | ICD-10-CM | POA: Insufficient documentation

## 2013-01-23 MED ORDER — CLINDAMYCIN HCL 150 MG PO CAPS: 300.0000 mg | ORAL_CAPSULE | Freq: Four times a day (QID) | ORAL | Status: DC

## 2013-01-23 MED ORDER — BUPIVACAINE-EPINEPHRINE PF 0.5-1:200000 % IJ SOLN
10.0000 mL | Freq: Once | INTRAMUSCULAR | Status: AC
  Administered 2013-01-23: 50 mg
  Filled 2013-01-23: qty 10

## 2013-01-23 MED ORDER — BUPIVACAINE-EPINEPHRINE (PF) 0.5% -1:200000 IJ SOLN
INTRAMUSCULAR | Status: AC
  Filled 2013-01-23: qty 1.8

## 2013-01-23 MED ORDER — HYDROCODONE-ACETAMINOPHEN 5-325 MG PO TABS: 2.0000 | ORAL_TABLET | ORAL | Status: DC | PRN

## 2013-01-23 NOTE — ED Provider Notes (Signed)
Medical screening examination/treatment/procedure(s) were performed by non-physician practitioner and as supervising physician I was immediately available for consultation/collaboration.    Nelia Shi, MD 01/23/13 1426

## 2013-01-23 NOTE — ED Provider Notes (Signed)
History     CSN: [id number]  Arrival date & time 01/23/13  1302   First MD Initiated Contact with Patient 01/23/13 1335      Chief Complaint  Patient presents with  . Dental Pain    (Consider location/radiation/quality/duration/timing/severity/associated sxs/prior treatment) Patient is a 47 y.o. female presenting with tooth pain. The history is provided by the patient.  Dental PainThe primary symptoms include mouth pain and dental injury. The symptoms began more than 1 week ago. The symptoms are worsening. The symptoms occur constantly.  Additional symptoms include: gum swelling and gum tenderness.   Pt complains of a tooth that is loose and hanging.   Pt reports pain when tooth moves.  Unable to see dentist until next Monday Past Medical History  Diagnosis Date  . Arthritis   . Asthma     Past Surgical History  Procedure Laterality Date  . Tubal ligation    . Cesarean section      History reviewed. No pertinent family history.  History  Substance Use Topics  . Smoking status: Former Games developer  . Smokeless tobacco: Never Used  . Alcohol Use: No    OB History   Grav Para Term Preterm Abortions TAB SAB Ect Mult Living                  Review of Systems  All other systems reviewed and are negative.    Allergies  Other and Tomato  Home Medications   Current Outpatient Rx  Name  Route  Sig  Dispense  Refill  . albuterol (PROVENTIL HFA;VENTOLIN HFA) 108 (90 BASE) MCG/ACT inhaler   Inhalation   Inhale 2 puffs into the lungs every 6 (six) hours as needed for wheezing.         Marland Kitchen albuterol (PROVENTIL) (5 MG/ML) 0.5% nebulizer solution   Nebulization   Take 10 mg by nebulization once.         Marland Kitchen ipratropium (ATROVENT) 0.02 % nebulizer solution   Nebulization   Take 500 mcg by nebulization once.         . methylPREDNISolone sodium succinate (SOLU-MEDROL) 125 mg/2 mL injection   Intravenous   Inject 125 mg into the vein once.         . naproxen  (NAPROSYN) 500 MG tablet   Oral   Take 1 tablet (500 mg total) by mouth 2 (two) times daily with a meal.   30 tablet   0   . predniSONE (DELTASONE) 10 MG tablet   Oral   Take 4 tablets (40 mg total) by mouth daily.   16 tablet   0   . predniSONE (DELTASONE) 10 MG tablet   Oral   Take 2 tablets (20 mg total) by mouth 2 (two) times daily.   20 tablet   0     BP 143/80  Pulse 83  Temp(Src) 98.3 F (36.8 C)  Ht 5\' 7"  (1.702 m)  Wt 270 lb (122.471 kg)  BMI 42.28 kg/m2  SpO2 100%  LMP 01/23/2013  Physical Exam  Nursing note and vitals reviewed. Constitutional: She appears well-nourished.  HENT:  Head: Normocephalic.  Eyes: Pupils are equal, round, and reactive to light.  Neck: Thyromegaly present.  Musculoskeletal: Normal range of motion.  Neurological: She is alert.  Skin: Skin is warm.    ED Course  Procedures (including critical care time)  Labs Reviewed - No data to display No results found.   No diagnosis found.    MDM  Dental  block   Tooth pulled with minimal traction and gauze,  Tooth intact,    Pt advised follow up with dentist,  Clindamycin and hydrocodone         Elson Areas, PA-C 01/23/13 1425

## 2013-01-23 NOTE — ED Notes (Signed)
Pt c/o dental pain x 1 week 

## 2013-01-23 NOTE — ED Notes (Signed)
Recent dental abscess, now c/o tooth "on lower front is loose and just hanging" x 2 days.  No fever.  Also c/o pain and unable to eat due to pain.  Has taken Tylenol and IBU without relief.

## 2013-02-05 ENCOUNTER — Emergency Department (HOSPITAL_BASED_OUTPATIENT_CLINIC_OR_DEPARTMENT_OTHER): Payer: Managed Care, Other (non HMO)

## 2013-02-05 ENCOUNTER — Encounter (HOSPITAL_BASED_OUTPATIENT_CLINIC_OR_DEPARTMENT_OTHER): Payer: Self-pay | Admitting: *Deleted

## 2013-02-05 ENCOUNTER — Inpatient Hospital Stay (HOSPITAL_BASED_OUTPATIENT_CLINIC_OR_DEPARTMENT_OTHER)
Admission: EM | Admit: 2013-02-05 | Discharge: 2013-02-21 | DRG: 207 | Disposition: A | Discharge status: Home / Self Care | Payer: Managed Care, Other (non HMO) | Attending: Pulmonary Disease | Admitting: Pulmonary Disease

## 2013-02-05 DIAGNOSIS — D649 Anemia, unspecified: Secondary | ICD-10-CM | POA: Diagnosis present

## 2013-02-05 DIAGNOSIS — E872 Acidosis, unspecified: Secondary | ICD-10-CM | POA: Diagnosis present

## 2013-02-05 DIAGNOSIS — J9600 Acute respiratory failure, unspecified whether with hypoxia or hypercapnia: Principal | ICD-10-CM

## 2013-02-05 DIAGNOSIS — J45902 Unspecified asthma with status asthmaticus: Secondary | ICD-10-CM

## 2013-02-05 DIAGNOSIS — J45901 Unspecified asthma with (acute) exacerbation: Secondary | ICD-10-CM

## 2013-02-05 DIAGNOSIS — Z6841 Body Mass Index (BMI) 40.0 and over, adult: Secondary | ICD-10-CM

## 2013-02-05 DIAGNOSIS — E876 Hypokalemia: Secondary | ICD-10-CM | POA: Diagnosis present

## 2013-02-05 DIAGNOSIS — E870 Hyperosmolality and hypernatremia: Secondary | ICD-10-CM | POA: Diagnosis present

## 2013-02-05 DIAGNOSIS — R5381 Other malaise: Secondary | ICD-10-CM

## 2013-02-05 DIAGNOSIS — M129 Arthropathy, unspecified: Secondary | ICD-10-CM | POA: Diagnosis present

## 2013-02-05 DIAGNOSIS — J101 Influenza due to other identified influenza virus with other respiratory manifestations: Secondary | ICD-10-CM

## 2013-02-05 DIAGNOSIS — E440 Moderate protein-calorie malnutrition: Secondary | ICD-10-CM

## 2013-02-05 DIAGNOSIS — N189 Chronic kidney disease, unspecified: Secondary | ICD-10-CM | POA: Diagnosis present

## 2013-02-05 DIAGNOSIS — R030 Elevated blood-pressure reading, without diagnosis of hypertension: Secondary | ICD-10-CM | POA: Diagnosis present

## 2013-02-05 DIAGNOSIS — Z87891 Personal history of nicotine dependence: Secondary | ICD-10-CM

## 2013-02-05 DIAGNOSIS — IMO0002 Reserved for concepts with insufficient information to code with codable children: Secondary | ICD-10-CM | POA: Diagnosis present

## 2013-02-05 DIAGNOSIS — E46 Unspecified protein-calorie malnutrition: Secondary | ICD-10-CM | POA: Diagnosis present

## 2013-02-05 DIAGNOSIS — M6282 Rhabdomyolysis: Secondary | ICD-10-CM | POA: Diagnosis present

## 2013-02-05 DIAGNOSIS — Z79899 Other long term (current) drug therapy: Secondary | ICD-10-CM

## 2013-02-05 DIAGNOSIS — J45998 Other asthma: Secondary | ICD-10-CM | POA: Diagnosis present

## 2013-02-05 DIAGNOSIS — R748 Abnormal levels of other serum enzymes: Secondary | ICD-10-CM

## 2013-02-05 DIAGNOSIS — N179 Acute kidney failure, unspecified: Secondary | ICD-10-CM

## 2013-02-05 DIAGNOSIS — E8779 Other fluid overload: Secondary | ICD-10-CM | POA: Diagnosis present

## 2013-02-05 DIAGNOSIS — J111 Influenza due to unidentified influenza virus with other respiratory manifestations: Secondary | ICD-10-CM | POA: Diagnosis present

## 2013-02-05 DIAGNOSIS — N9989 Other postprocedural complications and disorders of genitourinary system: Secondary | ICD-10-CM | POA: Diagnosis present

## 2013-02-05 DIAGNOSIS — N170 Acute kidney failure with tubular necrosis: Secondary | ICD-10-CM | POA: Diagnosis present

## 2013-02-05 DIAGNOSIS — T50995A Adverse effect of other drugs, medicaments and biological substances, initial encounter: Secondary | ICD-10-CM | POA: Diagnosis present

## 2013-02-05 LAB — CBC WITH DIFFERENTIAL/PLATELET
Basophils Absolute: 0 10*3/uL (ref 0.0–0.1)
Basophils Relative: 0 % (ref 0–1)
Eosinophils Relative: 0 % (ref 0–5)
HCT: 38.9 % (ref 36.0–46.0)
MCHC: 34.2 g/dL (ref 30.0–36.0)
MCV: 83.5 fL (ref 78.0–100.0)
Monocytes Absolute: 1.4 10*3/uL — ABNORMAL HIGH (ref 0.1–1.0)
RDW: 15.7 % — ABNORMAL HIGH (ref 11.5–15.5)

## 2013-02-05 LAB — BASIC METABOLIC PANEL
Calcium: 8.7 mg/dL (ref 8.4–10.5)
Creatinine, Ser: 0.8 mg/dL (ref 0.50–1.10)
GFR calc Af Amer: >90 mL/min (ref >90)

## 2013-02-05 LAB — D-DIMER, QUANTITATIVE: D-Dimer, Quant: <0.27 ug/mL-FEU (ref 0.00–0.48)

## 2013-02-05 MED ORDER — SODIUM CHLORIDE 0.9 % IV BOLUS (SEPSIS)
1000.0000 mL | Freq: Once | INTRAVENOUS | Status: AC
  Administered 2013-02-05: 1000 mL via INTRAVENOUS

## 2013-02-05 MED ORDER — ALBUTEROL SULFATE (5 MG/ML) 0.5% IN NEBU
5.0000 mg | INHALATION_SOLUTION | Freq: Once | RESPIRATORY_TRACT | Status: AC
  Administered 2013-02-05: 5 mg via RESPIRATORY_TRACT

## 2013-02-05 MED ORDER — METHYLPREDNISOLONE SODIUM SUCC 125 MG IJ SOLR
60.0000 mg | INTRAMUSCULAR | Status: DC
  Administered 2013-02-06 (×6): 60 mg via INTRAVENOUS
  Filled 2013-02-05: qty 0.96
  Filled 2013-02-05: qty 2
  Filled 2013-02-05 (×8): qty 0.96

## 2013-02-05 MED ORDER — ENOXAPARIN SODIUM 40 MG/0.4ML ~~LOC~~ SOLN: 40.0000 mg | SUBCUTANEOUS | Status: DC

## 2013-02-05 MED ORDER — BIOTENE DRY MOUTH MT LIQD
15.0000 mL | Freq: Two times a day (BID) | OROMUCOSAL | Status: DC
  Administered 2013-02-05 – 2013-02-07 (×4): 15 mL via OROMUCOSAL

## 2013-02-05 MED ORDER — ALBUTEROL (5 MG/ML) CONTINUOUS INHALATION SOLN
15.0000 mg/h | INHALATION_SOLUTION | RESPIRATORY_TRACT | Status: AC
  Administered 2013-02-05: 15 mg/h via RESPIRATORY_TRACT
  Filled 2013-02-05: qty 20

## 2013-02-05 MED ORDER — PNEUMOCOCCAL VAC POLYVALENT 25 MCG/0.5ML IJ INJ: 0.5000 mL | INJECTION | INTRAMUSCULAR | Status: DC

## 2013-02-05 MED ORDER — IOHEXOL 350 MG/ML SOLN
100.0000 mL | Freq: Once | INTRAVENOUS | Status: AC | PRN
  Administered 2013-02-05: 100 mL via INTRAVENOUS

## 2013-02-05 MED ORDER — ONDANSETRON HCL 4 MG/2ML IJ SOLN
4.0000 mg | Freq: Once | INTRAMUSCULAR | Status: AC
  Administered 2013-02-05: 4 mg via INTRAVENOUS
  Filled 2013-02-05: qty 2

## 2013-02-05 MED ORDER — ALBUTEROL SULFATE (5 MG/ML) 0.5% IN NEBU
2.5000 mg | INHALATION_SOLUTION | Freq: Four times a day (QID) | RESPIRATORY_TRACT | Status: DC
  Administered 2013-02-06 (×2): 2.5 mg via RESPIRATORY_TRACT
  Filled 2013-02-05 (×2): qty 0.5

## 2013-02-05 MED ORDER — SODIUM CHLORIDE 0.9 % IJ SOLN
3.0000 mL | Freq: Two times a day (BID) | INTRAMUSCULAR | Status: DC
  Administered 2013-02-06 – 2013-02-07 (×2): 3 mL via INTRAVENOUS
  Administered 2013-02-08: 10 mL via INTRAVENOUS
  Administered 2013-02-09 – 2013-02-10 (×3): 3 mL via INTRAVENOUS

## 2013-02-05 MED ORDER — SODIUM CHLORIDE 0.9 % IV SOLN
INTRAVENOUS | Status: DC
  Administered 2013-02-06 – 2013-02-07 (×3): via INTRAVENOUS

## 2013-02-05 MED ORDER — ONDANSETRON HCL 4 MG PO TABS
4.0000 mg | ORAL_TABLET | Freq: Four times a day (QID) | ORAL | Status: DC | PRN
  Administered 2013-02-06: 4 mg via ORAL
  Filled 2013-02-05: qty 1

## 2013-02-05 MED ORDER — HYDROCODONE-ACETAMINOPHEN 5-325 MG PO TABS
2.0000 | ORAL_TABLET | ORAL | Status: DC | PRN
  Administered 2013-02-05: 2 via ORAL
  Administered 2013-02-07: 1 via ORAL
  Filled 2013-02-05: qty 2
  Filled 2013-02-05: qty 1

## 2013-02-05 MED ORDER — ONDANSETRON HCL 4 MG/2ML IJ SOLN
4.0000 mg | Freq: Four times a day (QID) | INTRAMUSCULAR | Status: DC | PRN
  Administered 2013-02-07: 4 mg via INTRAVENOUS
  Filled 2013-02-05: qty 2

## 2013-02-05 MED ORDER — ALBUTEROL SULFATE (5 MG/ML) 0.5% IN NEBU
2.5000 mg | INHALATION_SOLUTION | RESPIRATORY_TRACT | Status: DC | PRN
  Administered 2013-02-06: 2.5 mg via RESPIRATORY_TRACT

## 2013-02-05 MED ORDER — DOCUSATE SODIUM 100 MG PO CAPS
100.0000 mg | ORAL_CAPSULE | Freq: Two times a day (BID) | ORAL | Status: DC
  Administered 2013-02-06 – 2013-02-07 (×3): 100 mg via ORAL
  Filled 2013-02-05 (×5): qty 1

## 2013-02-05 MED ORDER — DEXTROSE 5 % IV SOLN
500.0000 mg | INTRAVENOUS | Status: DC
  Administered 2013-02-06 – 2013-02-07 (×2): 500 mg via INTRAVENOUS
  Filled 2013-02-05 (×3): qty 500

## 2013-02-05 MED ORDER — IPRATROPIUM BROMIDE 0.02 % IN SOLN
0.5000 mg | Freq: Once | RESPIRATORY_TRACT | Status: AC
  Administered 2013-02-05: 0.5 mg via RESPIRATORY_TRACT

## 2013-02-05 MED ORDER — METHYLPREDNISOLONE SODIUM SUCC 125 MG IJ SOLR
125.0000 mg | Freq: Once | INTRAMUSCULAR | Status: AC
  Administered 2013-02-05: 125 mg via INTRAVENOUS
  Filled 2013-02-05: qty 2

## 2013-02-05 MED ORDER — SODIUM CHLORIDE 0.9 % IV SOLN
INTRAVENOUS | Status: AC
  Administered 2013-02-05: via INTRAVENOUS

## 2013-02-05 MED ORDER — IPRATROPIUM BROMIDE 0.02 % IN SOLN
RESPIRATORY_TRACT | Status: AC
  Filled 2013-02-05: qty 2.5

## 2013-02-05 MED ORDER — ALBUTEROL SULFATE (5 MG/ML) 0.5% IN NEBU
INHALATION_SOLUTION | RESPIRATORY_TRACT | Status: AC
  Filled 2013-02-05: qty 1

## 2013-02-05 MED ORDER — CLINDAMYCIN HCL 300 MG PO CAPS
300.0000 mg | ORAL_CAPSULE | Freq: Four times a day (QID) | ORAL | Status: DC
  Administered 2013-02-06 (×2): 300 mg via ORAL
  Filled 2013-02-05 (×6): qty 1

## 2013-02-05 NOTE — ED Notes (Signed)
Resp Assessment done at 17:30

## 2013-02-05 NOTE — ED Notes (Signed)
Pt. Reports feeling bad for approx. 2 days.  Pt able to talk and is receiving nebulizer treatment at present time.  Pt. Reports multiple symptoms.  Pt. Reports cough with nausea and chest and back pain.  Pt. Has wheezing bilat.  No vomiting.  Pt. Has had EKG and is on cardiac monitor with noted ST.

## 2013-02-05 NOTE — ED Provider Notes (Signed)
History     CSN: [id number]  Arrival date & time 02/05/13  1723   First MD Initiated Contact with Patient 02/05/13 1730      Chief Complaint  Patient presents with  . Asthma    (Consider location/radiation/quality/duration/timing/severity/associated sxs/prior treatment) HPI Comments: Patient with a history of asthma presents with a two-day history of worsening cough and chest congestion with associated wheezing and shortness of breath. She states she's been using albuterol inhaler at home without improvement. She currently does not have a nebulizer machine at home. She's not on any other medications per her asthma. She states that she feels congested in her chest and is having cough productive of yellow sputum. Her wheezing and chest tightness is worsened throughout today. She has some left-sided sharp pain that it's radiating to her back and is worse with coughing and inspiration. She denies any leg swelling or calf tenderness. She denies any other risk factors for PTE. She denies a history of heart problems in the past. She denies he fevers or chills. She's had some nausea but no vomiting  Patient is a 47 y.o. female presenting with asthma.  Asthma Associated symptoms include chest pain and shortness of breath. Pertinent negatives include no abdominal pain and no headaches.    Past Medical History  Diagnosis Date  . Arthritis   . Asthma     Past Surgical History  Procedure Laterality Date  . Tubal ligation    . Cesarean section      History reviewed. No pertinent family history.  History  Substance Use Topics  . Smoking status: Former Games developer  . Smokeless tobacco: Never Used  . Alcohol Use: No    OB History   Grav Para Term Preterm Abortions TAB SAB Ect Mult Living                  Review of Systems  Constitutional: Positive for fatigue. Negative for fever, chills and diaphoresis.  HENT: Positive for congestion, rhinorrhea and sneezing.   Eyes: Negative.    Respiratory: Positive for cough, shortness of breath and wheezing. Negative for chest tightness.   Cardiovascular: Positive for chest pain. Negative for leg swelling.  Gastrointestinal: Positive for nausea. Negative for vomiting, abdominal pain, diarrhea and blood in stool.  Genitourinary: Negative for frequency, hematuria, flank pain and difficulty urinating.  Musculoskeletal: Negative for back pain and arthralgias.  Skin: Negative for rash.  Neurological: Negative for dizziness, speech difficulty, weakness, numbness and headaches.    Allergies  Other and Tomato  Home Medications   Current Outpatient Rx  Name  Route  Sig  Dispense  Refill  . albuterol (PROVENTIL HFA;VENTOLIN HFA) 108 (90 BASE) MCG/ACT inhaler   Inhalation   Inhale 2 puffs into the lungs every 6 (six) hours as needed for wheezing.         Marland Kitchen albuterol (PROVENTIL) (5 MG/ML) 0.5% nebulizer solution   Nebulization   Take 10 mg by nebulization once.         . clindamycin (CLEOCIN) 150 MG capsule   Oral   Take 2 capsules (300 mg total) by mouth every 6 (six) hours.   28 capsule   0   . HYDROcodone-acetaminophen (NORCO/VICODIN) 5-325 MG per tablet   Oral   Take 2 tablets by mouth every 4 (four) hours as needed for pain.   10 tablet   0   . ipratropium (ATROVENT) 0.02 % nebulizer solution   Nebulization   Take 500 mcg by nebulization once.         Marland Kitchen  methylPREDNISolone sodium succinate (SOLU-MEDROL) 125 mg/2 mL injection   Intravenous   Inject 125 mg into the vein once.         . naproxen (NAPROSYN) 500 MG tablet   Oral   Take 1 tablet (500 mg total) by mouth 2 (two) times daily with a meal.   30 tablet   0   . predniSONE (DELTASONE) 10 MG tablet   Oral   Take 4 tablets (40 mg total) by mouth daily.   16 tablet   0   . predniSONE (DELTASONE) 10 MG tablet   Oral   Take 2 tablets (20 mg total) by mouth 2 (two) times daily.   20 tablet   0     BP 135/69  Pulse 128  Temp(Src) 98.6 F (37  C) (Oral)  Resp 18  SpO2 94%  LMP 01/23/2013  Physical Exam  Constitutional: She is oriented to person, place, and time. She appears well-developed and well-nourished. She appears distressed.  Patient tachypnea and increased work of breathing. She's talking in short sentences.  HENT:  Head: Normocephalic and atraumatic.  Eyes: Pupils are equal, round, and reactive to light.  Neck: Normal range of motion. Neck supple.  Cardiovascular: Normal rate, regular rhythm and normal heart sounds.   Pulmonary/Chest: She is in respiratory distress. She has wheezes. She has no rales. She exhibits tenderness (Left-sided chest tenderness is reproducible on palpation).  Abdominal: Soft. Bowel sounds are normal. There is no tenderness. There is no rebound and no guarding.  Musculoskeletal: Normal range of motion. She exhibits no edema.  No calf tenderness  Lymphadenopathy:    She has no cervical adenopathy.  Neurological: She is alert and oriented to person, place, and time.  Skin: Skin is warm and dry. No rash noted.  Psychiatric: She has a normal mood and affect.    ED Course  Procedures (including critical care time)  Results for orders placed during the hospital encounter of 02/05/13  CBC WITH DIFFERENTIAL      Result Value Range   WBC 5.7  4.0 - 10.5 K/uL   RBC 4.66  3.87 - 5.11 MIL/uL   Hemoglobin 13.3  12.0 - 15.0 g/dL   HCT 16.1  09.6 - 04.5 %   MCV 83.5  78.0 - 100.0 fL   MCH 28.5  26.0 - 34.0 pg   MCHC 34.2  30.0 - 36.0 g/dL   RDW 40.9 (*) 81.1 - 91.4 %   Platelets 248  150 - 400 K/uL   Neutrophils Relative 66  43 - 77 %   Neutro Abs 3.7  1.7 - 7.7 K/uL   Lymphocytes Relative 9 (*) 12 - 46 %   Lymphs Abs 0.5 (*) 0.7 - 4.0 K/uL   Monocytes Relative 25 (*) 3 - 12 %   Monocytes Absolute 1.4 (*) 0.1 - 1.0 K/uL   Eosinophils Relative 0  0 - 5 %   Eosinophils Absolute 0.0  0.0 - 0.7 K/uL   Basophils Relative 0  0 - 1 %   Basophils Absolute 0.0  0.0 - 0.1 K/uL  BASIC METABOLIC  PANEL      Result Value Range   Sodium 134 (*) 135 - 145 mEq/L   Potassium 3.7  3.5 - 5.1 mEq/L   Chloride 100  96 - 112 mEq/L   CO2 22  19 - 32 mEq/L   Glucose, Bld 103 (*) 70 - 99 mg/dL   BUN 11  6 - 23 mg/dL  Creatinine, Ser 0.80  0.50 - 1.10 mg/dL   Calcium 8.7  8.4 - 16.1 mg/dL   GFR calc non Af Amer 86 (*) >90 mL/min   GFR calc Af Amer >90  >90 mL/min  D-DIMER, QUANTITATIVE      Result Value Range   D-Dimer, Quant <0.27  0.00 - 0.48 ug/mL-FEU  TROPONIN I      Result Value Range   Troponin I <0.30  <0.30 ng/mL   Dg Chest 2 View  02/05/2013  *RADIOLOGY REPORT*  Clinical Data: Asthma, cough, shortness of breath  CHEST - 2 VIEW  Comparison: 12/16/2012  Findings: Cardiomediastinal silhouette is stable.  No acute infiltrate or pleural effusion.  No pulmonary edema.  Stable central mild bronchitic changes.  IMPRESSION: No active disease.  No significant change.   Original Report Authenticated By: Natasha Mead, M.D.    Ct Angio Chest Pe W/cm &/or Wo Cm  02/05/2013  *RADIOLOGY REPORT*  Clinical Data: 47 year old female with chest pain shortness of breath hypoxia.  Left side pain.  CT ANGIOGRAPHY CHEST  Technique:  Multidetector CT imaging of the chest using the standard protocol during bolus administration of intravenous contrast. Multiplanar reconstructed images including MIPs were obtained and reviewed to evaluate the vascular anatomy.  Contrast: OMNIPAQUE IOHEXOL 350 MG/ML SOLN  Comparison: chest radiograph from the same day and earlier.  Findings: Adequate contrast bolus timing in the pulmonary arterial tree.  No focal filling defect identified in the pulmonary arterial tree to suggest the presence of acute pulmonary embolism.  No pleural or pericardial effusion.  Negative visualized upper abdominal viscera.  Negative visualized aorta.  Coarsely calcified left lower pole thyroid nodule measuring 8 mm, significance doubtful.  No mediastinal lymphadenopathy.  Major airways are patent.  The  lungs are clear.  No acute osseous abnormality identified.  IMPRESSION: No evidence of acute pulmonary embolus.  Negative chest CTA.   Original Report Authenticated By: Erskine Speed, M.D.        Date: 02/05/2013  Rate: 119  Rhythm: sinus tachycardia  QRS Axis: normal  Intervals: normal  ST/T Wave abnormalities: normal  Conduction Disutrbances:none  Narrative Interpretation:   Old EKG Reviewed: none available   1. Asthma exacerbation       MDM  Patient was given Solu-Medrol a DuoNeb treatment and continuous nebs for an hour. She's doing better and she feels like her breathing is improved however her oxygen saturations are still deviated 89% on room air. She was placed on a nasal cannula we will continue to observe for a short period time and if she's not improving will go ahead and admit her for observation. Given her left-sided pleuritic pain with associated hypoxia we'll go ahead and do a CT chest rule out PE  I discussed the patient with Dr. Houston Siren who has accepted patient for transfer to [facility]. There is no evidence of pulmonary embolus.  CRITICAL CARE Performed by: Madysen Faircloth   Total critical care time: 60  Critical care time was exclusive of separately billable procedures and treating other patients.  Critical care was necessary to treat or prevent imminent or life-threatening deterioration.  Critical care was time spent personally by me on the following activities: development of treatment plan with patient and/or surrogate as well as nursing, discussions with consultants, evaluation of patient's response to treatment, examination of patient, obtaining history from patient or surrogate, ordering and performing treatments and interventions, ordering and review of laboratory studies, ordering and review of radiographic studies,  pulse oximetry and re-evaluation of patient's condition.      Rolan Bucco, MD 02/05/13 2111

## 2013-02-05 NOTE — ED Notes (Signed)
Asthma onset today states has had cough and chest congestion for 2 days states son has the flu

## 2013-02-05 NOTE — ED Notes (Signed)
Encouraged Pt. To stay in her bed due to shortness of breath that she could fall getting  Up and down.

## 2013-02-05 NOTE — ED Notes (Signed)
MD at bedside. 

## 2013-02-05 NOTE — Progress Notes (Signed)
PENDING ACCEPTANCE TRANFER NOTE:  Call received from:  Dr Fredderick Phenix of [facility].  REASON FOR REQUESTING TRANSFER:  Asthma exacerbation needing continual tx. HPI:   47 yo with asthma presents to [facility] with asthma exacerbation, tx with nebs and IV solumedrol.  Stable and better but still a little hypoxia with sat 88% and having wheezing.     PLAN:  According to telephone report, this patient was accepted for transfer to telemetry,   Under [facility] team: [id number],  I have requested an order be written to call Flow Manager at [phone] upon patient arrival to the floor for final physician assignment who will do the admission and give admitting orders.  SIGNEDHouston Siren, MD [facility]  02/05/2013, 9:09 PM

## 2013-02-05 NOTE — Progress Notes (Signed)
Placed patient on 2 liter nasal cannula to maintain SPO2 > 88%.  Current SPO2 on cannula is 94%

## 2013-02-05 NOTE — ED Notes (Signed)
Patient transported to CT 

## 2013-02-06 LAB — INFLUENZA PANEL BY PCR (TYPE A & B)
H1N1 flu by pcr: NOT DETECTED
Influenza A By PCR: NEGATIVE
Influenza B By PCR: POSITIVE — AB

## 2013-02-06 MED ORDER — ACETAMINOPHEN 325 MG PO TABS
650.0000 mg | ORAL_TABLET | Freq: Four times a day (QID) | ORAL | Status: DC | PRN
  Administered 2013-02-06: 650 mg via ORAL
  Filled 2013-02-06: qty 2

## 2013-02-06 MED ORDER — LEVALBUTEROL HCL 0.63 MG/3ML IN NEBU
0.6300 mg | INHALATION_SOLUTION | RESPIRATORY_TRACT | Status: DC | PRN
  Administered 2013-02-07 (×2): 0.63 mg via RESPIRATORY_TRACT
  Filled 2013-02-06: qty 3

## 2013-02-06 MED ORDER — LEVALBUTEROL HCL 0.63 MG/3ML IN NEBU
0.6300 mg | INHALATION_SOLUTION | Freq: Four times a day (QID) | RESPIRATORY_TRACT | Status: DC
  Administered 2013-02-06 – 2013-02-07 (×2): 0.63 mg via RESPIRATORY_TRACT
  Filled 2013-02-06 (×10): qty 3

## 2013-02-06 MED ORDER — PANTOPRAZOLE SODIUM 40 MG PO TBEC
40.0000 mg | DELAYED_RELEASE_TABLET | Freq: Every day | ORAL | Status: DC
  Administered 2013-02-06 – 2013-02-07 (×2): 40 mg via ORAL
  Filled 2013-02-06 (×3): qty 1

## 2013-02-06 MED ORDER — MAGNESIUM SULFATE 40 MG/ML IJ SOLN
2.0000 g | Freq: Once | INTRAMUSCULAR | Status: AC
  Administered 2013-02-06: 2 g via INTRAVENOUS
  Filled 2013-02-06 (×2): qty 50

## 2013-02-06 MED ORDER — METHYLPREDNISOLONE SODIUM SUCC 125 MG IJ SOLR: 125.0000 mg | Freq: Four times a day (QID) | INTRAMUSCULAR | Status: DC

## 2013-02-06 MED ORDER — METHYLPREDNISOLONE SODIUM SUCC 125 MG IJ SOLR
125.0000 mg | Freq: Four times a day (QID) | INTRAMUSCULAR | Status: DC
  Administered 2013-02-07 (×2): 125 mg via INTRAVENOUS
  Filled 2013-02-06 (×7): qty 2

## 2013-02-06 MED ORDER — ALBUTEROL SULFATE (5 MG/ML) 0.5% IN NEBU
2.5000 mg | INHALATION_SOLUTION | RESPIRATORY_TRACT | Status: DC
  Administered 2013-02-06 (×3): 2.5 mg via RESPIRATORY_TRACT
  Filled 2013-02-06: qty 0.5
  Filled 2013-02-06: qty 1
  Filled 2013-02-06: qty 0.5

## 2013-02-06 MED ORDER — OSELTAMIVIR PHOSPHATE 75 MG PO CAPS
75.0000 mg | ORAL_CAPSULE | Freq: Two times a day (BID) | ORAL | Status: DC
  Administered 2013-02-06 – 2013-02-07 (×4): 75 mg via ORAL
  Filled 2013-02-06 (×8): qty 1

## 2013-02-06 NOTE — Progress Notes (Signed)
   CARE MANAGEMENT NOTE 02/06/2013  Patient:  Kathleen Rodriguez, Kathleen Rodriguez   Account Number:  [id number]  Date Initiated:  02/06/2013  Documentation initiated by:  [facility]  Subjective/Objective Assessment:   shortness of breath,     Action/Plan:   lives with husband   Anticipated DC Date:  02/08/2013   Anticipated DC Plan:  HOME/SELF CARE      DC Planning Services  CM consult  Medication Assistance      Choice offered to / List presented to:             Status of service:  In process, will continue to follow Medicare Important Message given?   (If response is "NO", the following Medicare IM given date fields will be blank) Date Medicare IM given:   Date Additional Medicare IM given:    Discharge Disposition:    Per UR Regulation:    If discussed at Long Length of Stay Meetings, dates discussed:    Comments:  02/06/2013 1045 NCM spoke to pt and states she has insurance with [employer]. Gave permisison to call husband for info. Info sent for benefits check. If pt needs assistance with meds, will use MATCH program and arrange appt with [facility] for follow up post dc.  Kathleen Donning RN CCM Case Mgmt phone [phone]  Raynie Steinhaus, husband # [phone]

## 2013-02-06 NOTE — Progress Notes (Addendum)
Utilization review complete. Trentyn Boisclair RN CCM Case Mgmt phone [phone] 

## 2013-02-06 NOTE — Progress Notes (Signed)
RT removed patient from bipap and placed patient on venturi mask 40%. Patient sat is 97%. Patient tolerating venturi mask well at this time. RT will continue monitor.

## 2013-02-06 NOTE — Progress Notes (Signed)
eLink Physician-Brief Progress Note Patient Name: Kathleen Rodriguez DOB: August 18, 1966 MRN: [id number]  Date of Service  02/06/2013   HPI/Events of Note  Best Practice  eICU Interventions  SCDs ordered while in bed.  May be d/ced when fully ambulatory   Intervention Category Intermediate Interventions: Best-practice therapies (e.g. DVT, beta blocker, etc.)  DETERDING,ELIZABETH 02/06/2013, 11:49 PM

## 2013-02-06 NOTE — Progress Notes (Signed)
Pt became more SOB with increased work of breathing. RT was at bedside & RR & on call NP were called. Both arrived to the floor to assess pt and new orders were placed. Pt was placed on bipap & was transferred to 3301. Husband was notified.

## 2013-02-06 NOTE — Progress Notes (Signed)
Subjective: Kathleen Rodriguez was admitted to [facility] yesterday with asthma exacerbation. This NP was called by RN and rapid response to come to 5500 2/2 pt having increased SOB and WOB. Lungs tight with wheezing. Kathleen Rodriguez said her SOB "comes and goes" and last episode like this one was around 1500 hrs today. She was able to breathe slower and breathing improved. She says this episode started about 30 mins ago when she just became very SOB and tight with some air hunger. Resp therapy at bedside and pt was placed on Bipap and transferred to 3300 with hopes of removing bipap shortly. But, pt still warrants closer watching for the night. Solumedrol 60mg  IV extra dose given and pt's dose increased to 125mg . To have xopenex tx's now as Albuterol made her tachy. She is already feeling better on the Bipap as we arrive in 3300.   Objective: Vital signs in last 24 hours: Temp:  [97.8 F (36.6 C)-98.3 F (36.8 C)] 98.1 F (36.7 C) (04/16 1445) Pulse Rate:  [91-109] 109 (04/16 1445) Resp:  [20] 20 (04/16 1445) BP: (122-136)/(79-82) 123/82 mmHg (04/16 1445) SpO2:  [89 %-96 %] 89 % (04/16 1445) FiO2 (%):  [32 %-36 %] 36 % (04/16 0301) Weight:  [120.6 kg (265 lb 14 oz)] 120.6 kg (265 lb 14 oz) (04/15 2301) Weight change:  Last BM Date: 02/05/13  Intake/Output from previous day: 04/15 0701 - 04/16 0700 In: 806.3 [I.V.:806.3] Out: -  Intake/Output this shift:    Gen: obese AAF in acute resp distress with increased WOB Resp: as above and tight with wheezing throughout.  Card: S1S2, slightly tachy, SR Neuro: alert and oriented. Responds appropriately.   Lab Results:  Recent Labs  02/05/13 1740  WBC 5.7  HGB 13.3  HCT 38.9  PLT 248   BMET  Recent Labs  02/05/13 1740  NA 134*  K 3.7  CL 100  CO2 22  GLUCOSE 103*  BUN 11  CREATININE 0.80  CALCIUM 8.7    Studies/Results: Dg Chest 2 View  02/05/2013  *RADIOLOGY REPORT*  Clinical Data: Asthma, cough, shortness of breath  CHEST - 2 VIEW   Comparison: 12/16/2012  Findings: Cardiomediastinal silhouette is stable.  No acute infiltrate or pleural effusion.  No pulmonary edema.  Stable central mild bronchitic changes.  IMPRESSION: No active disease.  No significant change.   Original Report Authenticated By: Natasha Mead, M.D.    Ct Angio Chest Pe W/cm &/or Wo Cm  02/05/2013  *RADIOLOGY REPORT*  Clinical Data: 47 year old female with chest pain shortness of breath hypoxia.  Left side pain.  CT ANGIOGRAPHY CHEST  Technique:  Multidetector CT imaging of the chest using the standard protocol during bolus administration of intravenous contrast. Multiplanar reconstructed images including MIPs were obtained and reviewed to evaluate the vascular anatomy.  Contrast: OMNIPAQUE IOHEXOL 350 MG/ML SOLN  Comparison: chest radiograph from the same day and earlier.  Findings: Adequate contrast bolus timing in the pulmonary arterial tree.  No focal filling defect identified in the pulmonary arterial tree to suggest the presence of acute pulmonary embolism.  No pleural or pericardial effusion.  Negative visualized upper abdominal viscera.  Negative visualized aorta.  Coarsely calcified left lower pole thyroid nodule measuring 8 mm, significance doubtful.  No mediastinal lymphadenopathy.  Major airways are patent.  The lungs are clear.  No acute osseous abnormality identified.  IMPRESSION: No evidence of acute pulmonary embolus.  Negative chest CTA.   Original Report Authenticated By: Erskine Speed, M.D.  Medications: reviewed  Assessment/Plan: 1. Asthma exacerbation with acute resp distress. Solumedrol increased. Xopenex treatments substituted for Albuterol and increased in frequency. Will wean off Bipap as soon as possible. 2. Flu precautions, cont antibiotics.  Leave in SDU overnight for closer monitoring.  CT chest neg today. Will cont to follow.   LOS: 1 day   Jimmye Norman 02/06/2013, 9:37 PM

## 2013-02-06 NOTE — Progress Notes (Signed)
Addendum  Patient seen and examined, chart and data base reviewed.  I agree with the above assessment and plan.  For full details please see Mrs. Algis Downs PA note.  Severe asthma exacerbation, questionable influenza, patient reported that she had a son with flu at home.   Clint Lipps, MD [facility] Pager: [phone] 02/06/2013, 12:46 PM

## 2013-02-06 NOTE — H&P (Signed)
[facility] History and Physical  Kathleen Rodriguez BJY:[id number] DOB: 1966-10-15    PCP:   No primary provider on file.   Chief Complaint: Asthma exacerbation.  HPI: Kathleen Rodriguez is an 47 y.o. female with history of asthma, arthritis, obesity, presents to meds and the [facility] complaining of shortness of breath. She has significant wheezing at that time and was in mild-to-moderate respiratory distress. She admitted to having brown sputum productive cough as well. She was given nebulizer treatments along with intravenous steroid, and subsequently improved. Unfortunately she still have an wheezing and dyspnea on exertion. She also had a negative CT pulmonary angiogram along with a chest x-ray showed no acute cardiopulmonary pathology. Her serology was also unremarkable as well. Hospitalist was asked to accept patient in transfer for further treatment of asthmatic bronchitis with acute exacerbation.  Rewiew of Systems:  Constitutional: Negative for malaise, fever and chills. No significant weight loss or weight gain Eyes: Negative for eye pain, redness and discharge, diplopia, visual changes, or flashes of light. ENMT: Negative for ear pain, hoarseness, nasal congestion, sinus pressure and sore throat. No headaches; tinnitus, drooling, or problem swallowing. Cardiovascular: Negative for chest pain, palpitations, diaphoresis,and peripheral edema. ; No orthopnea, PND Respiratory: Negative for  hemoptysis,  and stridor. No pleuritic chestpain. Gastrointestinal: Negative for nausea, vomiting, diarrhea, constipation, abdominal pain, melena, blood in stool, hematemesis, jaundice and rectal bleeding.    Genitourinary: Negative for frequency, dysuria, incontinence,flank pain and hematuria; Musculoskeletal: Negative for back pain and neck pain. Negative for swelling and trauma.;  Skin: . Negative for pruritus, rash, abrasions, bruising and skin lesion.; ulcerations Neuro: Negative for headache,  lightheadedness and neck stiffness. Negative for weakness, altered level of consciousness , altered mental status, extremity weakness, burning feet, involuntary movement, seizure and syncope.  Psych: negative for anxiety, depression, insomnia, tearfulness, panic attacks, hallucinations, paranoia, suicidal or homicidal ideation    Past Medical History  Diagnosis Date  . Arthritis   . Asthma     Past Surgical History  Procedure Laterality Date  . Tubal ligation    . Cesarean section      Medications:  HOME MEDS: Prior to Admission medications   Medication Sig Start Date End Date Taking? Authorizing Provider  albuterol (PROVENTIL HFA;VENTOLIN HFA) 108 (90 BASE) MCG/ACT inhaler Inhale 2 puffs into the lungs every 6 (six) hours as needed for wheezing.    Historical Provider, MD  albuterol (PROVENTIL) (5 MG/ML) 0.5% nebulizer solution Take 10 mg by nebulization once.    Historical Provider, MD  clindamycin (CLEOCIN) 150 MG capsule Take 2 capsules (300 mg total) by mouth every 6 (six) hours. 01/23/13   Elson Areas, PA-C  HYDROcodone-acetaminophen (NORCO/VICODIN) 5-325 MG per tablet Take 2 tablets by mouth every 4 (four) hours as needed for pain. 01/23/13   Elson Areas, PA-C  ipratropium (ATROVENT) 0.02 % nebulizer solution Take 500 mcg by nebulization once.    Historical Provider, MD  methylPREDNISolone sodium succinate (SOLU-MEDROL) 125 mg/2 mL injection Inject 125 mg into the vein once.    Historical Provider, MD  naproxen (NAPROSYN) 500 MG tablet Take 1 tablet (500 mg total) by mouth 2 (two) times daily with a meal. 06/30/12 06/30/13  Hannah Muthersbaugh, PA-C  predniSONE (DELTASONE) 10 MG tablet Take 4 tablets (40 mg total) by mouth daily. 10/27/12   Doug Sou, MD  predniSONE (DELTASONE) 10 MG tablet Take 2 tablets (20 mg total) by mouth 2 (two) times daily. 12/16/12   Geoffery Lyons, MD  Allergies:  Allergies  Allergen Reactions  . Other Hives and Itching    Ketchup  . Tomato  Hives and Itching    Social History:   reports that she has quit smoking. She has never used smokeless tobacco. She reports that she does not drink alcohol or use illicit drugs.  Family History: History reviewed. No pertinent family history.   Physical Exam: Filed Vitals:   02/05/13 2301 02/05/13 2324 02/05/13 2329 02/06/13 0301  BP:   136/79   Pulse:   105   Temp:   98.3 F (36.8 C)   TempSrc:   Oral   Resp:   20   Height: 5\' 6"  (1.676 m)     Weight: 120.6 kg (265 lb 14 oz)     SpO2:  92% 91% 93%   Blood pressure 136/79, pulse 105, temperature 98.3 F (36.8 C), temperature source Oral, resp. rate 20, height 5\' 6"  (1.676 m), weight 120.6 kg (265 lb 14 oz), last menstrual period 01/23/2013, SpO2 93.00%.  GEN:  Pleasant  patient lying in the stretcher in no acute distress; cooperative with exam. Able to complete sentences PSYCH:  alert and oriented x4; does not appear anxious or depressed; affect is appropriate. HEENT: Mucous membranes pink and anicteric; PERRLA; EOM intact; no cervical lymphadenopathy nor thyromegaly or carotid bruit; no JVD; There were no stridor. Neck is very supple. Breasts:: Not examined CHEST WALL: No tenderness CHEST: Normal respiration, she has bilateral inspiratory and expiratory wheezes HEART: Regular rate and rhythm.  There are no murmur, rub, or gallops.   BACK: No kyphosis or scoliosis; no CVA tenderness ABDOMEN: soft and non-tender; no masses, no organomegaly, normal abdominal bowel sounds; no pannus; no intertriginous candida. There is no rebound and no distention. Rectal Exam: Not done EXTREMITIES: No bone or joint deformity; age-appropriate arthropathy of the hands and knees; no edema; no ulcerations.  There is no calf tenderness. Genitalia: not examined PULSES: 2+ and symmetric SKIN: Normal hydration no rash or ulceration CNS: Cranial nerves 2-12 grossly intact no focal lateralizing neurologic deficit.  Speech is fluent; uvula elevated with  phonation, facial symmetry and tongue midline. DTR are normal bilaterally, cerebella exam is intact, barbinski is negative and strengths are equaled bilaterally.  No sensory loss.   Labs on Admission:  Basic Metabolic Panel:  Recent Labs Lab 02/05/13 1740  NA 134*  K 3.7  CL 100  CO2 22  GLUCOSE 103*  BUN 11  CREATININE 0.80  CALCIUM 8.7   Liver Function Tests: No results found for this basename: AST, ALT, ALKPHOS, BILITOT, PROT, ALBUMIN,  in the last 168 hours No results found for this basename: LIPASE, AMYLASE,  in the last 168 hours No results found for this basename: AMMONIA,  in the last 168 hours CBC:  Recent Labs Lab 02/05/13 1740  WBC 5.7  NEUTROABS 3.7  HGB 13.3  HCT 38.9  MCV 83.5  PLT 248   Cardiac Enzymes:  Recent Labs Lab 02/05/13 1740  TROPONINI <0.30    CBG: No results found for this basename: GLUCAP,  in the last 168 hours   Radiological Exams on Admission: Dg Chest 2 View  02/05/2013  *RADIOLOGY REPORT*  Clinical Data: Asthma, cough, shortness of breath  CHEST - 2 VIEW  Comparison: 12/16/2012  Findings: Cardiomediastinal silhouette is stable.  No acute infiltrate or pleural effusion.  No pulmonary edema.  Stable central mild bronchitic changes.  IMPRESSION: No active disease.  No significant change.   Original Report Authenticated By:  Natasha Mead, M.D.    Ct Angio Chest Pe W/cm &/or Wo Cm  02/05/2013  *RADIOLOGY REPORT*  Clinical Data: 47 year old female with chest pain shortness of breath hypoxia.  Left side pain.  CT ANGIOGRAPHY CHEST  Technique:  Multidetector CT imaging of the chest using the standard protocol during bolus administration of intravenous contrast. Multiplanar reconstructed images including MIPs were obtained and reviewed to evaluate the vascular anatomy.  Contrast: OMNIPAQUE IOHEXOL 350 MG/ML SOLN  Comparison: chest radiograph from the same day and earlier.  Findings: Adequate contrast bolus timing in the pulmonary arterial  tree.  No focal filling defect identified in the pulmonary arterial tree to suggest the presence of acute pulmonary embolism.  No pleural or pericardial effusion.  Negative visualized upper abdominal viscera.  Negative visualized aorta.  Coarsely calcified left lower pole thyroid nodule measuring 8 mm, significance doubtful.  No mediastinal lymphadenopathy.  Major airways are patent.  The lungs are clear.  No acute osseous abnormality identified.  IMPRESSION: No evidence of acute pulmonary embolus.  Negative chest CTA.   Original Report Authenticated By: Erskine Speed, M.D.     Assessment/Plan Present on Admission:  . Severe obesity (BMI >= 40) Asthma exacerbation  Acute bronchitis.  PLAN:  She has done significantly better. We'll admit her for acute bronchitis with asthma exacerbation. I will continue her intravenous Solu-Medrol, frequent nebulizer treatments, supplemental O2, and will start on IV Zithromax. I congratulated her for quitting tobacco use. She is stable, full code, and will be admitted to [facility]. Thank you for allowing me to participate in the care of this nice patient  Other plans as per orders.  Code Status: Full code   Meliton Samad, MD. [facility] Pager [phone] 7pm to 7am.  02/06/2013, 4:54 AM

## 2013-02-06 NOTE — Progress Notes (Signed)
Pt. Extremely dypsneic after getting up to use the bathroom. BBS very diminshed. Back to albuterol nebs given and now bilateral wheezes can be heard. Will assess treatment to q4

## 2013-02-06 NOTE — Plan of Care (Signed)
Problem: Phase I Progression Outcomes Goal: Dyspnea controlled at rest Outcome: Not Met (add Reason) Pt continues sob with exertion but better after sitting and relaxed for a few minutes

## 2013-02-06 NOTE — Progress Notes (Signed)
[facility] PROGRESS NOTE  Olena Willy ZOX:[id number] DOB: 05-29-1966 DOA: 02/05/2013 PCP: No primary provider on file.  Assessment/Plan:  Severe Asthma exacerbation Patient with frequent asthma exacerbations Etiology uncertain:  ? Influenza due to sick contacts at home vs acute bronchitis  Check influenza PCR IV Steroids, Q4 Nebs, IV magnesium, PPI.  On Azithromycin.  Will d/c cleocin. (afebrile) Patient will require PCP and Pulmonary follow up after discharge Will discharge on increase maintenance regimen.  Severe obesity Contributing to decreased pulmonary status. ?obstructive sleep apnea Nutrition consultation  Tobacco abuse Reports she has quit smoking   DVT Prophylaxis:  lovenox  Code Status: full Family Communication:  Disposition Plan: to home when able.    Consultants:  [facility] pulmonary.  Will consult if no improvement in 24 hours.  Will make outpatient follow up with [facility].  Procedures:    Antibiotics:  Cleocin and Azithromycin at admission.  Narrowed to Azithromycin today.  HPI/Subjective: This is my 3rd episode this year.  Only on albuterol at home.  No pulmonology follow up currently  Objective: Filed Vitals:   02/05/13 2329 02/06/13 0301 02/06/13 0500 02/06/13 0920  BP: 136/79  122/79   Pulse: 105  91   Temp: 98.3 F (36.8 C)  97.8 F (36.6 C)   TempSrc: Oral  Oral   Resp: 20  20   Height:      Weight:      SpO2: 91% 93% 91% 92%    Intake/Output Summary (Last 24 hours) at 02/06/13 1035 Last data filed at 02/06/13 0842  Gross per 24 hour  Intake 1046.25 ml  Output    150 ml  Net 896.25 ml   Filed Weights   02/05/13 2301  Weight: 120.6 kg (265 lb 14 oz)    Exam:   General:  Wd, Wn, AA female, A&O, Sitting up working to breathe, taking short breaths.  Cardiovascular: tachycardic, no murmurs, rubs or gallops, no lower extremity edema  Respiratory: Significantly decreased breath sounds with minimal wheeze  (Tight).  Increased work of breathing on 3.5 liters n/c  Abdomen: obese, Soft, non-tender, non-distended, + bowel sounds, no masses  Musculoskeletal: Able to move all 4 extremities, 5/5 strength in each  Data Reviewed: Basic Metabolic Panel:  Recent Labs Lab 02/05/13 1740  NA 134*  K 3.7  CL 100  CO2 22  GLUCOSE 103*  BUN 11  CREATININE 0.80  CALCIUM 8.7   CBC:  Recent Labs Lab 02/05/13 1740  WBC 5.7  NEUTROABS 3.7  HGB 13.3  HCT 38.9  MCV 83.5  PLT 248   Cardiac Enzymes:  Recent Labs Lab 02/05/13 1740  TROPONINI <0.30     Studies: Dg Chest 2 View  02/05/2013  *RADIOLOGY REPORT*  Clinical Data: Asthma, cough, shortness of breath  CHEST - 2 VIEW  Comparison: 12/16/2012  Findings: Cardiomediastinal silhouette is stable.  No acute infiltrate or pleural effusion.  No pulmonary edema.  Stable central mild bronchitic changes.  IMPRESSION: No active disease.  No significant change.   Original Report Authenticated By: Natasha Mead, M.D.    Ct Angio Chest Pe W/cm &/or Wo Cm  02/05/2013  *RADIOLOGY REPORT*  Clinical Data: 47 year old female with chest pain shortness of breath hypoxia.  Left side pain.  CT ANGIOGRAPHY CHEST  Technique:  Multidetector CT imaging of the chest using the standard protocol during bolus administration of intravenous contrast. Multiplanar reconstructed images including MIPs were obtained and reviewed to evaluate the vascular anatomy.  Contrast: OMNIPAQUE IOHEXOL  350 MG/ML SOLN  Comparison: chest radiograph from the same day and earlier.  Findings: Adequate contrast bolus timing in the pulmonary arterial tree.  No focal filling defect identified in the pulmonary arterial tree to suggest the presence of acute pulmonary embolism.  No pleural or pericardial effusion.  Negative visualized upper abdominal viscera.  Negative visualized aorta.  Coarsely calcified left lower pole thyroid nodule measuring 8 mm, significance doubtful.  No mediastinal  lymphadenopathy.  Major airways are patent.  The lungs are clear.  No acute osseous abnormality identified.  IMPRESSION: No evidence of acute pulmonary embolus.  Negative chest CTA.   Original Report Authenticated By: Erskine Speed, M.D.     Scheduled Meds: . sodium chloride   Intravenous STAT  . albuterol  2.5 mg Nebulization Q4H  . antiseptic oral rinse  15 mL Mouth Rinse BID  . azithromycin  500 mg Intravenous Q24H  . clindamycin  300 mg Oral Q6H  . docusate sodium  100 mg Oral BID  . methylPREDNISolone (SOLU-MEDROL) injection  60 mg Intravenous Q4H  . oseltamivir  75 mg Oral BID  . sodium chloride  3 mL Intravenous Q12H   Continuous Infusions: . sodium chloride 100 mL/hr at 02/06/13 0755    Active Problems:   Asthma exacerbation   Severe obesity (BMI >= 40)    Conley Canal [facility] Pager [phone]. If 7PM-7AM, please contact night-coverage at [email or web address], password [facility] 02/06/2013, 10:35 AM  LOS: 1 day

## 2013-02-06 NOTE — Progress Notes (Addendum)
02/06/2013 1700 Call to husband to let him know his insurance expired on 01/21/2013. They will provided [employer] with necessary paperwork and he going to follow up with insurance on 02/08/2013.  Isidoro Donning RN CCM Case Mgmt phone [phone]

## 2013-02-06 NOTE — Plan of Care (Signed)
Problem: Undesirable Food Choices (NB-1.7) Goal: Nutrition education Formal process to instruct or train a patient/client in a skill or to impart knowledge to help patients/clients voluntarily manage or modify food choices and eating behavior to maintain or improve health. Outcome: Completed/Met Date Met:  02/06/13 Nutrition Education Note:  RD consulted for nutrition education regarding weight loss.  Body mass index is 42.93 kg/(m^2). Pt meets criteria for Obesity class 3, extreme based on current BMI.  RD provided "Weight Loss Tips" handout from the Academy of [employer] Dietetics. Emphasized the importance of serving sizes and provided examples of correct portions of common foods. Discussed importance of controlled and consistent intake throughout the day. Provided examples of ways to balance meals/snacks and encouraged intake of high-fiber, whole grain complex carbohydrates. Emphasized the importance of hydration with calorie-free beverages and limiting sugar-sweetened beverages. Encouraged pt to discuss physical activity options with physician. Teach back method used.  Expect poor compliance.  Current diet order is carb mod medium, patient is consuming approximately 100% of meals at this time. Labs and medications reviewed. No further nutrition interventions warranted at this time. RD contact information provided. If additional nutrition issues arise, please re-consult RD.  Clarene Duke RD, LDN Pager [phone] After Hours pager [phone]

## 2013-02-06 NOTE — Plan of Care (Signed)
Problem: Phase II Progression Outcomes Goal: O2 sats > equal to 90% on RA or at baseline Outcome: Completed/Met Date Met:  02/06/13 Decreased 02 to 2lpm

## 2013-02-07 ENCOUNTER — Encounter (HOSPITAL_COMMUNITY): Payer: Self-pay | Admitting: Anesthesiology

## 2013-02-07 ENCOUNTER — Inpatient Hospital Stay (HOSPITAL_COMMUNITY): Payer: Managed Care, Other (non HMO) | Admitting: Anesthesiology

## 2013-02-07 ENCOUNTER — Inpatient Hospital Stay (HOSPITAL_COMMUNITY): Payer: Managed Care, Other (non HMO)

## 2013-02-07 DIAGNOSIS — J101 Influenza due to other identified influenza virus with other respiratory manifestations: Secondary | ICD-10-CM | POA: Diagnosis present

## 2013-02-07 DIAGNOSIS — J9600 Acute respiratory failure, unspecified whether with hypoxia or hypercapnia: Principal | ICD-10-CM | POA: Diagnosis present

## 2013-02-07 DIAGNOSIS — J45902 Unspecified asthma with status asthmaticus: Secondary | ICD-10-CM

## 2013-02-07 DIAGNOSIS — J111 Influenza due to unidentified influenza virus with other respiratory manifestations: Secondary | ICD-10-CM

## 2013-02-07 LAB — CBC WITH DIFFERENTIAL/PLATELET
Basophils Relative: 0 % (ref 0–1)
Eosinophils Absolute: 0 10*3/uL (ref 0.0–0.7)
Eosinophils Relative: 0 % (ref 0–5)
HCT: 39.8 % (ref 36.0–46.0)
Hemoglobin: 13.1 g/dL (ref 12.0–15.0)
MCV: 85.8 fL (ref 78.0–100.0)
Neutrophils Relative %: 86 % — ABNORMAL HIGH (ref 43–77)
Platelets: 221 10*3/uL (ref 150–400)
RBC: 4.64 MIL/uL (ref 3.87–5.11)
WBC: 14.5 10*3/uL — ABNORMAL HIGH (ref 4.0–10.5)

## 2013-02-07 LAB — POCT I-STAT 3, ART BLOOD GAS (G3+)
Bicarbonate: 27 mEq/L — ABNORMAL HIGH (ref 20.0–24.0)
Bicarbonate: 27.5 mEq/L — ABNORMAL HIGH (ref 20.0–24.0)
O2 Saturation: 100 %
O2 Saturation: 99 %
Patient temperature: 98.7
TCO2: 29 mmol/L (ref 0–100)
TCO2: 30 mmol/L (ref 0–100)
pCO2 arterial: 99.7 mmHg (ref 35.0–45.0)
pCO2 arterial: 76.6 mmHg (ref 35.0–45.0)
pCO2 arterial: 81.3 mmHg (ref 35.0–45.0)
pCO2 arterial: 66.8 mmHg (ref 35.0–45.0)
pH, Arterial: 7.087 — CL (ref 7.350–7.450)
pH, Arterial: 7.156 — CL (ref 7.350–7.450)
pH, Arterial: 7.137 — CL (ref 7.350–7.450)
pH, Arterial: 7.175 — CL (ref 7.350–7.450)
pO2, Arterial: 143 mmHg — ABNORMAL HIGH (ref 80.0–100.0)
pO2, Arterial: 146 mmHg — ABNORMAL HIGH (ref 80.0–100.0)

## 2013-02-07 LAB — GLUCOSE, CAPILLARY
Glucose-Capillary: 138 mg/dL — ABNORMAL HIGH (ref 70–99)
Glucose-Capillary: 146 mg/dL — ABNORMAL HIGH (ref 70–99)

## 2013-02-07 LAB — URINE MICROSCOPIC-ADD ON

## 2013-02-07 LAB — COMPREHENSIVE METABOLIC PANEL
ALT: 27 U/L (ref 0–35)
Albumin: 3.3 g/dL — ABNORMAL LOW (ref 3.5–5.2)
Alkaline Phosphatase: 49 U/L (ref 39–117)
BUN: 14 mg/dL (ref 6–23)
Calcium: 8.4 mg/dL (ref 8.4–10.5)
Potassium: 4.8 mEq/L (ref 3.5–5.1)
Sodium: 139 mEq/L (ref 135–145)
Total Protein: 7.5 g/dL (ref 6.0–8.3)

## 2013-02-07 LAB — URINALYSIS, ROUTINE W REFLEX MICROSCOPIC
Ketones, ur: 15 mg/dL — AB
Nitrite: NEGATIVE
Protein, ur: >300 mg/dL — AB
Urobilinogen, UA: 0.2 mg/dL (ref 0.0–1.0)

## 2013-02-07 MED ORDER — SUCCINYLCHOLINE CHLORIDE 20 MG/ML IJ SOLN
INTRAMUSCULAR | Status: DC | PRN
  Administered 2013-02-07: 100 mg via INTRAVENOUS

## 2013-02-07 MED ORDER — ALBUTEROL (5 MG/ML) CONTINUOUS INHALATION SOLN
10.0000 mg/h | INHALATION_SOLUTION | RESPIRATORY_TRACT | Status: DC
  Administered 2013-02-07: 10 mg/h via RESPIRATORY_TRACT
  Filled 2013-02-07 (×2): qty 20

## 2013-02-07 MED ORDER — FENTANYL CITRATE 0.05 MG/ML IJ SOLN
INTRAMUSCULAR | Status: AC
  Administered 2013-02-07: 100 ug via INTRAVENOUS
  Filled 2013-02-07: qty 2

## 2013-02-07 MED ORDER — ALBUTEROL (5 MG/ML) CONTINUOUS INHALATION SOLN
2.5000 mg/h | INHALATION_SOLUTION | RESPIRATORY_TRACT | Status: DC
  Filled 2013-02-07: qty 20

## 2013-02-07 MED ORDER — POTASSIUM CHLORIDE IN NACL 20-0.45 MEQ/L-% IV SOLN
INTRAVENOUS | Status: DC
  Administered 2013-02-07 – 2013-02-08 (×2): via INTRAVENOUS
  Filled 2013-02-07 (×4): qty 1000

## 2013-02-07 MED ORDER — METHYLPREDNISOLONE SODIUM SUCC 125 MG IJ SOLR
80.0000 mg | Freq: Four times a day (QID) | INTRAMUSCULAR | Status: DC
  Administered 2013-02-07 – 2013-02-08 (×4): 80 mg via INTRAVENOUS
  Filled 2013-02-07: qty 2
  Filled 2013-02-07: qty 1.28
  Filled 2013-02-07: qty 2
  Filled 2013-02-07 (×5): qty 1.28
  Filled 2013-02-07: qty 2
  Filled 2013-02-07 (×2): qty 1.28

## 2013-02-07 MED ORDER — INSULIN ASPART 100 UNIT/ML ~~LOC~~ SOLN
0.0000 [IU] | SUBCUTANEOUS | Status: DC
  Administered 2013-02-08 (×4): 2 [IU] via SUBCUTANEOUS
  Administered 2013-02-08: 3 [IU] via SUBCUTANEOUS
  Administered 2013-02-09: 2 [IU] via SUBCUTANEOUS
  Administered 2013-02-09 (×2): 3 [IU] via SUBCUTANEOUS
  Administered 2013-02-09 – 2013-02-14 (×16): 2 [IU] via SUBCUTANEOUS
  Administered 2013-02-14: 13:00:00 via SUBCUTANEOUS
  Administered 2013-02-15: 2 [IU] via SUBCUTANEOUS
  Administered 2013-02-15: 3 [IU] via SUBCUTANEOUS
  Administered 2013-02-15 – 2013-02-17 (×5): 2 [IU] via SUBCUTANEOUS

## 2013-02-07 MED ORDER — LEVALBUTEROL HCL 0.63 MG/3ML IN NEBU: 0.6300 mg | INHALATION_SOLUTION | RESPIRATORY_TRACT | Status: DC

## 2013-02-07 MED ORDER — MIDAZOLAM HCL 2 MG/2ML IJ SOLN: 2.0000 mg | INTRAMUSCULAR | Status: DC | PRN

## 2013-02-07 MED ORDER — CHLORHEXIDINE GLUCONATE 0.12 % MT SOLN
15.0000 mL | Freq: Two times a day (BID) | OROMUCOSAL | Status: DC
  Administered 2013-02-07 – 2013-02-18 (×22): 15 mL via OROMUCOSAL
  Filled 2013-02-07 (×18): qty 15

## 2013-02-07 MED ORDER — ENOXAPARIN SODIUM 40 MG/0.4ML ~~LOC~~ SOLN
40.0000 mg | SUBCUTANEOUS | Status: DC
  Administered 2013-02-07: 40 mg via SUBCUTANEOUS
  Filled 2013-02-07 (×2): qty 0.4

## 2013-02-07 MED ORDER — MAGNESIUM SULFATE 40 MG/ML IJ SOLN
2.0000 g | Freq: Once | INTRAMUSCULAR | Status: AC
  Administered 2013-02-07: 2 g via INTRAVENOUS
  Filled 2013-02-07: qty 50

## 2013-02-07 MED ORDER — METHYLPREDNISOLONE SODIUM SUCC 125 MG IJ SOLR
125.0000 mg | INTRAMUSCULAR | Status: AC
  Administered 2013-02-07: 125 mg via INTRAVENOUS
  Filled 2013-02-07: qty 2

## 2013-02-07 MED ORDER — FENTANYL BOLUS VIA INFUSION
25.0000 ug | Freq: Four times a day (QID) | INTRAVENOUS | Status: DC | PRN
  Administered 2013-02-07: 75 ug via INTRAVENOUS
  Filled 2013-02-07: qty 100

## 2013-02-07 MED ORDER — MIDAZOLAM BOLUS VIA INFUSION
1.0000 mg | INTRAVENOUS | Status: DC | PRN
  Administered 2013-02-07: 2 mg via INTRAVENOUS
  Filled 2013-02-07: qty 2

## 2013-02-07 MED ORDER — ALBUMIN HUMAN 5 % IV SOLN
INTRAVENOUS | Status: AC
  Filled 2013-02-07: qty 250

## 2013-02-07 MED ORDER — PROPOFOL 10 MG/ML IV EMUL
5.0000 ug/kg/min | INTRAVENOUS | Status: DC
  Administered 2013-02-07: 15 ug/kg/min via INTRAVENOUS
  Administered 2013-02-07: 50 ug/kg/min via INTRAVENOUS
  Administered 2013-02-07: 60 ug/kg/min via INTRAVENOUS
  Administered 2013-02-08: 50 ug/kg/min via INTRAVENOUS
  Administered 2013-02-08 (×2): 70 ug/kg/min via INTRAVENOUS
  Administered 2013-02-08: 60 ug/kg/min via INTRAVENOUS
  Administered 2013-02-08 (×3): 50 ug/kg/min via INTRAVENOUS
  Administered 2013-02-08: 60 ug/kg/min via INTRAVENOUS
  Administered 2013-02-08 – 2013-02-09 (×2): 50 ug/kg/min via INTRAVENOUS
  Administered 2013-02-09: 59.978 ug/kg/min via INTRAVENOUS
  Administered 2013-02-09: 60 ug/kg/min via INTRAVENOUS
  Administered 2013-02-09 (×2): 50 ug/kg/min via INTRAVENOUS
  Administered 2013-02-09: 60 ug/kg/min via INTRAVENOUS
  Administered 2013-02-09 – 2013-02-10 (×2): 50 ug/kg/min via INTRAVENOUS
  Administered 2013-02-10 (×2): 60 ug/kg/min via INTRAVENOUS
  Administered 2013-02-10: 50 ug/kg/min via INTRAVENOUS
  Administered 2013-02-10: 59.978 ug/kg/min via INTRAVENOUS
  Filled 2013-02-07 (×29): qty 100

## 2013-02-07 MED ORDER — MIDAZOLAM HCL 2 MG/2ML IJ SOLN
INTRAMUSCULAR | Status: AC
  Administered 2013-02-07: 2 mg via INTRAVENOUS
  Filled 2013-02-07: qty 4

## 2013-02-07 MED ORDER — PROPOFOL 10 MG/ML IV BOLUS
INTRAVENOUS | Status: DC | PRN
  Administered 2013-02-07: 170 mg via INTRAVENOUS

## 2013-02-07 MED ORDER — ALBUTEROL (5 MG/ML) CONTINUOUS INHALATION SOLN
25.0000 mg | INHALATION_SOLUTION | RESPIRATORY_TRACT | Status: AC
  Administered 2013-02-07: 25 mg via RESPIRATORY_TRACT
  Filled 2013-02-07: qty 20

## 2013-02-07 MED ORDER — MIDAZOLAM HCL 2 MG/2ML IJ SOLN: 2.0000 mg | Freq: Once | INTRAMUSCULAR | Status: DC

## 2013-02-07 MED ORDER — ALBUTEROL SULFATE (5 MG/ML) 0.5% IN NEBU
2.5000 mg | INHALATION_SOLUTION | Freq: Once | RESPIRATORY_TRACT | Status: AC
  Administered 2013-02-07: 2.5 mg via RESPIRATORY_TRACT

## 2013-02-07 MED ORDER — IPRATROPIUM BROMIDE 0.02 % IN SOLN
0.5000 mg | RESPIRATORY_TRACT | Status: DC
  Administered 2013-02-07: 0.5 mg via RESPIRATORY_TRACT
  Filled 2013-02-07: qty 2.5

## 2013-02-07 MED ORDER — LORAZEPAM 2 MG/ML IJ SOLN
INTRAMUSCULAR | Status: AC
  Filled 2013-02-07: qty 1

## 2013-02-07 MED ORDER — VECURONIUM BROMIDE 10 MG IV SOLR
10.0000 mg | Freq: Once | INTRAVENOUS | Status: AC
  Filled 2013-02-07: qty 10

## 2013-02-07 MED ORDER — VECURONIUM BROMIDE 10 MG IV SOLR
INTRAVENOUS | Status: AC
  Administered 2013-02-07: 10 mg via INTRAVENOUS
  Filled 2013-02-07: qty 10

## 2013-02-07 MED ORDER — BIOTENE DRY MOUTH MT LIQD
15.0000 mL | Freq: Four times a day (QID) | OROMUCOSAL | Status: DC
  Administered 2013-02-08 – 2013-02-18 (×41): 15 mL via OROMUCOSAL

## 2013-02-07 MED ORDER — FENTANYL CITRATE 0.05 MG/ML IJ SOLN
50.0000 ug | INTRAMUSCULAR | Status: DC | PRN
  Administered 2013-02-07 (×2): 100 ug via INTRAVENOUS
  Filled 2013-02-07 (×2): qty 2

## 2013-02-07 MED ORDER — SODIUM CHLORIDE 0.9 % IV SOLN
25.0000 ug/h | INTRAVENOUS | Status: DC
  Administered 2013-02-07: 100 ug/h via INTRAVENOUS
  Filled 2013-02-07: qty 50

## 2013-02-07 MED ORDER — ALBUTEROL SULFATE (5 MG/ML) 0.5% IN NEBU
10.0000 mg | INHALATION_SOLUTION | RESPIRATORY_TRACT | Status: DC
  Filled 2013-02-07: qty 2

## 2013-02-07 MED ORDER — FAMOTIDINE IN NACL 20-0.9 MG/50ML-% IV SOLN
20.0000 mg | INTRAVENOUS | Status: DC
  Administered 2013-02-07 – 2013-02-10 (×4): 20 mg via INTRAVENOUS
  Filled 2013-02-07 (×6): qty 50

## 2013-02-07 MED ORDER — SODIUM CHLORIDE 0.9 % IJ SOLN
INTRAMUSCULAR | Status: AC
  Filled 2013-02-07: qty 30

## 2013-02-07 MED ORDER — ALBUTEROL (5 MG/ML) CONTINUOUS INHALATION SOLN
20.0000 mg/h | INHALATION_SOLUTION | RESPIRATORY_TRACT | Status: AC
  Administered 2013-02-07: 20 mg/h via RESPIRATORY_TRACT
  Filled 2013-02-07: qty 20

## 2013-02-07 MED ORDER — MIDAZOLAM HCL 5 MG/ML IJ SOLN
1.0000 mg/h | INTRAMUSCULAR | Status: DC
  Administered 2013-02-07: 2 mg/h via INTRAVENOUS
  Filled 2013-02-07: qty 10

## 2013-02-07 NOTE — Anesthesia Preprocedure Evaluation (Signed)
Anesthesia Evaluation Anesthesia Physical Anesthesia Plan  ASA: IV and emergent  Anesthesia Plan:    Post-op Pain Management:    Induction:   Airway Management Planned:   Additional Equipment:   Intra-op Plan:   Post-operative Plan:   Informed Consent:   Plan Discussed with:   Anesthesia Plan Comments:         Anesthesia Quick Evaluation  

## 2013-02-07 NOTE — Progress Notes (Signed)
Called into patients room with complaints of increased SOB at 1320, tried to speak to patient about slowing down her breathing.She was unable to do so, increased patient to a NRB mask from a veni mask, sats were 94%. BP 162/82, patient continued to have a increase in anxiety, called and spoke with Dr. Arthor Captain, his PA arrived at bedside, patient continued to have increase in SOB, patient stated she wanted a tube to help her breathe, PA paged PCCM and anesthesia, 1 mg of IV ativan was given to help with anxiety, ordered per PA. Anesthesia intubated at bedside, report called to Thayer Ohm, RN on 2100, patient transported to 2100 with RN, RRT, and resp. Attempted to call pt husband, both numbers from chart were not in use, was able to get ahold of patients sister [name], updated her on patients condition. Patients sister stated she would come up shortly to get patients purse, that she didn't want me to send it to security.

## 2013-02-07 NOTE — Progress Notes (Signed)
Remains intubated and asynchronous with the vent.   Vecuronium ordered for synchrony; might need a drip.

## 2013-02-07 NOTE — Progress Notes (Signed)
[facility] PROGRESS NOTE  Kathleen Rodriguez JXB:[id number] DOB: June 10, 1966 DOA: 02/05/2013 PCP: No primary provider on file.  Assessment/Plan:  Severe Asthma exacerbation Patient with frequent asthma exacerbations This episode set off by H1N1B influenza. Placed on BiPap and transferred to step down overnight Currently on second 4 hour continuous neb.  Still working to breath IV Steroids, PPI.  D/C'd antibiotics.  On Tamiflu Patient will require PCP and Pulmonary follow up after discharge Will discharge on increase maintenance regimen. (Frequent ER visits)  Influenza H1N1B positive.  Did not have a flu vaccine this year. On Tamiflu  Severe obesity Contributing to decreased pulmonary status. ?obstructive sleep apnea Nutrition consultation  Tobacco abuse Reports she has quit smoking   DVT Prophylaxis:  lovenox  Code Status: full Family Communication:  Disposition Plan: remain in step down today.    Consultants:  [facility] s[facility] pulmonary.  Will make outpatient follow up with [facility].  Procedures:    Antibiotics:  Cleocin and Azithromycin at admission. Cleocin d/c'd 4/16.  Azith d/c'd 4/17  HPI/Subjective: No complaints.    Objective: Filed Vitals:   02/07/13 0248 02/07/13 0335 02/07/13 0421 02/07/13 0637  BP:  92/74 92/74   Pulse:  101 101 113  Temp:   98.3 F (36.8 C)   TempSrc:   Oral   Resp:  12 17 13   Height:      Weight:      SpO2: 98% 96% 99% 99%    Intake/Output Summary (Last 24 hours) at 02/07/13 0744 Last data filed at 02/07/13 0400  Gross per 24 hour  Intake   1680 ml  Output    525 ml  Net   1155 ml   Filed Weights   02/05/13 2301  Weight: 120.6 kg (265 lb 14 oz)    Exam:   General:  Wd, Wn, obese female, A&O, on continuous neb, working to breathe, but speaking in sentences.  Cardiovascular: tachycardic, no murmurs, rubs or gallops, no lower extremity edema  Respiratory: still with decreased breath sounds with minimal  wheeze (Tight).  Short breaths, Increased work of breathing   Abdomen: obese, Soft, non-tender, non-distended, + bowel sounds, no masses  Musculoskeletal: Able to move all 4 extremities, 5/5 strength in each  Data Reviewed: Basic Metabolic Panel:  Recent Labs Lab 02/05/13 1740  NA 134*  K 3.7  CL 100  CO2 22  GLUCOSE 103*  BUN 11  CREATININE 0.80  CALCIUM 8.7   CBC:  Recent Labs Lab 02/05/13 1740  WBC 5.7  NEUTROABS 3.7  HGB 13.3  HCT 38.9  MCV 83.5  PLT 248   Cardiac Enzymes:  Recent Labs Lab 02/05/13 1740  TROPONINI <0.30     Studies: Dg Chest 2 View  02/05/2013  *RADIOLOGY REPORT*  Clinical Data: Asthma, cough, shortness of breath  CHEST - 2 VIEW  Comparison: 12/16/2012  Findings: Cardiomediastinal silhouette is stable.  No acute infiltrate or pleural effusion.  No pulmonary edema.  Stable central mild bronchitic changes.  IMPRESSION: No active disease.  No significant change.   Original Report Authenticated By: Natasha Mead, M.D.    Ct Angio Chest Pe W/cm &/or Wo Cm  02/05/2013  *RADIOLOGY REPORT*  Clinical Data: 47 year old female with chest pain shortness of breath hypoxia.  Left side pain.  CT ANGIOGRAPHY CHEST  Technique:  Multidetector CT imaging of the chest using the standard protocol during bolus administration of intravenous contrast. Multiplanar reconstructed images including MIPs were obtained and reviewed to evaluate the vascular anatomy.  Contrast: OMNIPAQUE IOHEXOL 350 MG/ML SOLN  Comparison: chest radiograph from the same day and earlier.  Findings: Adequate contrast bolus timing in the pulmonary arterial tree.  No focal filling defect identified in the pulmonary arterial tree to suggest the presence of acute pulmonary embolism.  No pleural or pericardial effusion.  Negative visualized upper abdominal viscera.  Negative visualized aorta.  Coarsely calcified left lower pole thyroid nodule measuring 8 mm, significance doubtful.  No mediastinal  lymphadenopathy.  Major airways are patent.  The lungs are clear.  No acute osseous abnormality identified.  IMPRESSION: No evidence of acute pulmonary embolus.  Negative chest CTA.   Original Report Authenticated By: Erskine Speed, M.D.     Scheduled Meds: . antiseptic oral rinse  15 mL Mouth Rinse BID  . docusate sodium  100 mg Oral BID  . levalbuterol  0.63 mg Nebulization Q6H  . methylPREDNISolone (SOLU-MEDROL) injection  125 mg Intravenous Q6H  . oseltamivir  75 mg Oral BID  . pantoprazole  40 mg Oral Daily  . sodium chloride  3 mL Intravenous Q12H   Continuous Infusions: . sodium chloride 100 mL/hr at 02/07/13 0031    Active Problems:   Asthma exacerbation   Severe obesity (BMI >= 40)    Conley Canal [facility] Pager [phone]. If 7PM-7AM, please contact night-coverage at [email or web address], password [facility] 02/07/2013, 7:44 AM  LOS: 2 days

## 2013-02-07 NOTE — Progress Notes (Signed)
Patient is still tolerating continuous areosol treatment well at this time. Wheezes are mild, sat 99%, HR 115, RR 21. RT will continue to monitor.

## 2013-02-07 NOTE — Progress Notes (Signed)
RT placed patient on bipap per work of breathing. Patient is tolerating bipap well at this time. RT will continue to monitor.

## 2013-02-07 NOTE — Progress Notes (Signed)
Patient was place on continuous four hour albuterol treatment per patient tight wheezing breath sounds. Patient is tolerating treatment well at 0400 patient sat is 99%, HR 106, RR 20. Patient says she is feeling better but wheeze is still present.

## 2013-02-07 NOTE — Progress Notes (Signed)
Called by step down RN for assistance.  Found patient in respiratory distress, extremely anxious "I'm going to die", "I need that tube down my throat".  Patient feels something in her throat.   Pulse 140 BP 154/101 Oxygen sat 96% on non-rebreather mask Ativan 1 mg given. Critical care paged for transfer to 2100 ICU Anesthesia paged.  They intubated.  Patient fighting the tube.  Still very tachycardic.  Algis Downs, PA-C [facility] Pager: [phone]

## 2013-02-07 NOTE — Progress Notes (Signed)
Addendum  Patient seen and examined, chart and data base reviewed.  I agree with the above assessment and plan.  For full details please see Mrs. Algis Downs PA note.  Patient has a stepdown for respiratory distress last night.  Patient continues to have labored breathing and anxiety.  She has relatively clear lungs but very stridorous sound, I am worried about laryngeal edema.   Clint Lipps, MD [facility] Pager: [phone] 02/07/2013, 2:10 PM

## 2013-02-07 NOTE — Progress Notes (Signed)
Noted, very labored breathing, likely she will not be able to sustain this for long time. Critical care called, patient will be intubated and mechanically ventilated.  Clint Lipps Pager: [phone] 02/07/2013, 2:13 PM

## 2013-02-07 NOTE — Progress Notes (Signed)
Called at 1341 to assist with patient needing intubation.  On arrival patient alert - skin warm and diaphoretic - speaking broken sentences - severe resp distress with accessory muscle use - on NRB mask  - RR 48 - PA Algis Downs present = patient begging to have breathing tube - can't breathe any more per PA -  ST 141 BP 178/87  O2 sat 95%.  Anesthesia called PTA = they arrived - patient intubated per CRNA with anesthesiologist present - tol well.  BP 116/65 ST 121  - transported to 2104 - during transport patient waking and asynchronous with ventilator - 2 mg Versed IV and 100 mcg Fentanyl IV given per order Algis Downs PA.  Patient resting,.  Handoff to Dana, RN on 2100.

## 2013-02-07 NOTE — Progress Notes (Addendum)
Pt weaned from BIPAP shortly after arrival to SDU. Placed on 40% venturi mask. Solumedrol increased. RT requested continuous neb which was ordered. Will cont to follow.  KJKG, NP Update: NP to beside again. RT requested 4 hour continuous neb. Pt says she feels better, then moves around-gets up to bedside commode and SOB starts again. Will place Foley 2/2 resp distress. Bedrest for now. Cont to monitor.  KJKG, NP

## 2013-02-07 NOTE — Consult Note (Signed)
PULMONARY  / CRITICAL CARE MEDICINE  Name: Kathleen Rodriguez MRN: [id number] DOB: [birth year]    ADMISSION DATE:  02/05/2013 CONSULTATION DATE:  02/07/2013  REFERRING MD :  [facility] PRIMARY SERVICE: [facility]  CHIEF COMPLAINT:  SOB  BRIEF PATIENT DESCRIPTION: 47 yo BF with hx of asthma, arthritis, and obesity admitted on 4/15 with SOB in significant respiratory distress. Patient had a negative CT pulmonary angiogram on 4/15. 4/16 patient came back positive with Influenza B and started on Tamiflu on 4/17. Patient intubated 4/17 in acute respiratory distress.  SIGNIFICANT EVENTS / STUDIES:  4/15-Chest CT negative 4/16-Influenza B positive 4/17-Started on Tamiflu  LINES / TUBES: 4/17- ETT >>  CULTURES: 4/17 trach aspirate >> 4/17-BC >> 4/17 UC >>  ANTIBIOTICS: 4/15-Azithromycin>>>4/17 4/15 Cleocin>>>4/16 Oseltamivir 4/17 >>   HISTORY OF PRESENT ILLNESS:  47 yo BF with hx of asthma, arthritis, and obesity admitted on 4/15 with SOB in significant respiratory distress. Patient admitted to having a brown sputum productive cough. Patient had a negative CT pulmonary angiogram and CXR on 4/15. Patient came back positive with Influenza B and started on Tamiflu on 4/17. Patient intubated 4/17 in acute respiratory distress.  PAST MEDICAL HISTORY :  Past Medical History  Diagnosis Date  . Arthritis   . Asthma    Past Surgical History  Procedure Laterality Date  . Tubal ligation    . Cesarean section     Prior to Admission medications   Medication Sig Start Date End Date Taking? Authorizing Provider  albuterol (PROVENTIL HFA;VENTOLIN HFA) 108 (90 BASE) MCG/ACT inhaler Inhale 2 puffs into the lungs every 6 (six) hours as needed for wheezing.   Yes Historical Provider, MD  predniSONE (DELTASONE) 10 MG tablet Take 10 mg by mouth daily. Patient took for three days.   Yes Historical Provider, MD   Allergies  Allergen Reactions  . Other Hives and Itching    Ketchup  . Tomato Hives and Itching     FAMILY HISTORY:  History reviewed. No pertinent family history. SOCIAL HISTORY:  reports that she has quit smoking. She has never used smokeless tobacco. She reports that she does not drink alcohol or use illicit drugs.  REVIEW OF SYSTEMS:  Unable to obtain as patient is ventilated and sedated.   SUBJECTIVE:   VITAL SIGNS: Temp:  [96.6 F (35.9 C)-98.7 F (37.1 C)] 96.6 F (35.9 C) (04/17 1140) Pulse Rate:  [90-124] 124 (04/17 0807) Resp:  [12-24] 24 (04/17 0807) BP: (92-164)/(63-101) 154/101 mmHg (04/17 1140) SpO2:  [88 %-100 %] 88 % (04/17 0807) FiO2 (%):  [40 %-50 %] 40 % (04/17 0212) HEMODYNAMICS:   VENTILATOR SETTINGS: Vent Mode:  [-] BIPAP FiO2 (%):  [40 %-50 %] 40 % INTAKE / OUTPUT: Intake/Output     04/16 0701 - 04/17 0700 04/17 0701 - 04/18 0700   P.O. 480 480   I.V. (mL/kg) 700 (5.8)    IV Piggyback 500    Total Intake(mL/kg) 1680 (13.9) 480 (4)   Urine (mL/kg/hr) 525 (0.2) 500 (0.6)   Total Output 525 500   Net +1155 -20        Urine Occurrence 3 x    Stool Occurrence  1 x     PHYSICAL EXAMINATION: General:  Obese Black female intubated and sedated Neuro:  Sedated on vent HEENT: PERRL. ETT. No JVD. Cardiovascular:  Tachycardic, Regular rhythm. NO R/G/M Lungs:  Diffuse prolonged expiratory wheezing Abdomen:  Normal bowel sounds.  Musculoskeletal:  No obvious deformities.  Skin:  Warm to the  touch. No rashes  LABS:  Recent Labs Lab 02/05/13 1740  HGB 13.3  WBC 5.7  PLT 248  NA 134*  K 3.7  CL 100  CO2 22  GLUCOSE 103*  BUN 11  CREATININE 0.80  CALCIUM 8.7  TROPONINI <0.30   No results found for this basename: GLUCAP,  in the last 168 hours  CXR: NACPD  ASSESSMENT / PLAN:  PULMONARY A: Acute Respiratory Failure Status asthmaticus Ventilator dyssynchrony P:   Vent settings established Daily WUA/SBT if indicated Systemic steroids Nebulized BDs   CARDIOVASCULAR A: Sinus tachycardia  P:  Monitor Treat underlying causes  - pain, discomfort, etc   RENAL A:  No acute findings P:   CMP Pending   GASTROINTESTINAL A:  No acute findings P:  H2 blocker for stress ulcers NPO; possibly start TF 4/18   HEMATOLOGIC A:  No acute findings P:  CBC pending DVT prophylaxis with Lovenox   INFECTIOUS A:  Influenza B P:   Cont Tamiflu for 5 days Waiting on BC, sputum culture, and UC Monitor fever curve   ENDOCRINE A:  No Acute Findings  P:  SSI on systemic steroids   NEUROLOGIC A:  Sedated P:   Cont sedation with fentanyl and propofyl to RASS -3 to -4   TODAY'S SUMMARY:    I have personally obtained a history, examined the patient, evaluated laboratory and imaging results, formulated the assessment and plan and placed orders. CRITICAL CARE: The patient is critically ill with multiple organ systems failure and requires high complexity decision making for assessment and support, frequent evaluation and titration of therapies, application of advanced monitoring technologies and extensive interpretation of multiple databases. Critical Care Time devoted to patient care services described in this note is 35 minutes.   Merwyn Katos, MD Pulmonary and Critical Care Medicine [facility] Pager: [phone]  02/07/2013, 2:19 PM

## 2013-02-08 ENCOUNTER — Inpatient Hospital Stay (HOSPITAL_COMMUNITY): Payer: Managed Care, Other (non HMO)

## 2013-02-08 DIAGNOSIS — J45901 Unspecified asthma with (acute) exacerbation: Secondary | ICD-10-CM

## 2013-02-08 LAB — POCT I-STAT 3, ART BLOOD GAS (G3+)
Acid-base deficit: 3 mmol/L — ABNORMAL HIGH (ref 0.0–2.0)
Acid-base deficit: 5 mmol/L — ABNORMAL HIGH (ref 0.0–2.0)
Acid-base deficit: 6 mmol/L — ABNORMAL HIGH (ref 0.0–2.0)
Acid-base deficit: 4 mmol/L — ABNORMAL HIGH (ref 0.0–2.0)
Bicarbonate: 26.6 mEq/L — ABNORMAL HIGH (ref 20.0–24.0)
Patient temperature: 98
Patient temperature: 98
TCO2: 29 mmol/L (ref 0–100)
pH, Arterial: 7.195 — CL (ref 7.350–7.450)
pH, Arterial: 7.108 — CL (ref 7.350–7.450)
pO2, Arterial: 170 mmHg — ABNORMAL HIGH (ref 80.0–100.0)
pO2, Arterial: 84 mmHg (ref 80.0–100.0)

## 2013-02-08 LAB — GLUCOSE, CAPILLARY
Glucose-Capillary: 126 mg/dL — ABNORMAL HIGH (ref 70–99)
Glucose-Capillary: 134 mg/dL — ABNORMAL HIGH (ref 70–99)
Glucose-Capillary: 134 mg/dL — ABNORMAL HIGH (ref 70–99)

## 2013-02-08 LAB — BASIC METABOLIC PANEL
BUN: 21 mg/dL (ref 6–23)
BUN: 26 mg/dL — ABNORMAL HIGH (ref 6–23)
BUN: 30 mg/dL — ABNORMAL HIGH (ref 6–23)
Calcium: 7.8 mg/dL — ABNORMAL LOW (ref 8.4–10.5)
Calcium: 7.6 mg/dL — ABNORMAL LOW (ref 8.4–10.5)
Chloride: 102 mEq/L (ref 96–112)
Creatinine, Ser: 2.77 mg/dL — ABNORMAL HIGH (ref 0.50–1.10)
GFR calc Af Amer: 41 mL/min — ABNORMAL LOW (ref >90)
GFR calc Af Amer: 25 mL/min — ABNORMAL LOW (ref >90)
GFR calc Af Amer: 22 mL/min — ABNORMAL LOW (ref >90)
GFR calc non Af Amer: 35 mL/min — ABNORMAL LOW (ref >90)
GFR calc non Af Amer: 21 mL/min — ABNORMAL LOW (ref >90)
GFR calc non Af Amer: 19 mL/min — ABNORMAL LOW (ref >90)
Glucose, Bld: 163 mg/dL — ABNORMAL HIGH (ref 70–99)
Glucose, Bld: 111 mg/dL — ABNORMAL HIGH (ref 70–99)
Potassium: 3.8 mEq/L (ref 3.5–5.1)
Sodium: 138 mEq/L (ref 135–145)

## 2013-02-08 LAB — CBC
HCT: 37.9 % (ref 36.0–46.0)
Hemoglobin: 12.4 g/dL (ref 12.0–15.0)
MCHC: 32.7 g/dL (ref 30.0–36.0)
RBC: 4.35 MIL/uL (ref 3.87–5.11)

## 2013-02-08 LAB — URINE CULTURE

## 2013-02-08 LAB — HIV ANTIBODY (ROUTINE TESTING W REFLEX): HIV: NONREACTIVE

## 2013-02-08 MED ORDER — SODIUM CHLORIDE 0.45 % IV SOLN
INTRAVENOUS | Status: DC
  Administered 2013-02-08 – 2013-02-09 (×4): via INTRAVENOUS

## 2013-02-08 MED ORDER — SODIUM CHLORIDE 0.9 % IV SOLN
3.0000 ug/kg/min | INTRAVENOUS | Status: DC
  Administered 2013-02-08: 2 ug/kg/min via INTRAVENOUS
  Administered 2013-02-08: 3 ug/kg/min via INTRAVENOUS
  Administered 2013-02-09: 1.5 ug/kg/min via INTRAVENOUS
  Administered 2013-02-10: 1.505 ug/kg/min via INTRAVENOUS
  Filled 2013-02-08 (×7): qty 20

## 2013-02-08 MED ORDER — IPRATROPIUM BROMIDE HFA 17 MCG/ACT IN AERS
4.0000 | INHALATION_SPRAY | RESPIRATORY_TRACT | Status: DC
  Administered 2013-02-08: 4 via RESPIRATORY_TRACT
  Filled 2013-02-08: qty 12.9

## 2013-02-08 MED ORDER — DEXTROSE 5 % IV SOLN
1.0000 g | INTRAVENOUS | Status: DC
  Administered 2013-02-08 – 2013-02-09 (×2): 1 g via INTRAVENOUS
  Filled 2013-02-08 (×3): qty 10

## 2013-02-08 MED ORDER — METHYLPREDNISOLONE SODIUM SUCC 125 MG IJ SOLR
125.0000 mg | Freq: Four times a day (QID) | INTRAMUSCULAR | Status: DC
  Administered 2013-02-08 – 2013-02-09 (×4): 125 mg via INTRAVENOUS
  Filled 2013-02-08 (×8): qty 2

## 2013-02-08 MED ORDER — VANCOMYCIN HCL 10 G IV SOLR
2500.0000 mg | Freq: Once | INTRAVENOUS | Status: AC
  Administered 2013-02-08: 2500 mg via INTRAVENOUS
  Filled 2013-02-08: qty 2500

## 2013-02-08 MED ORDER — ALBUTEROL SULFATE (5 MG/ML) 0.5% IN NEBU: 2.5000 mg | INHALATION_SOLUTION | RESPIRATORY_TRACT | Status: DC

## 2013-02-08 MED ORDER — LEVOFLOXACIN IN D5W 750 MG/150ML IV SOLN
750.0000 mg | INTRAVENOUS | Status: DC
  Administered 2013-02-08 – 2013-02-12 (×3): 750 mg via INTRAVENOUS
  Filled 2013-02-08 (×3): qty 150

## 2013-02-08 MED ORDER — ARTIFICIAL TEARS OP OINT
1.0000 "application " | TOPICAL_OINTMENT | Freq: Three times a day (TID) | OPHTHALMIC | Status: DC
  Administered 2013-02-08 – 2013-02-11 (×10): 1 via OPHTHALMIC
  Filled 2013-02-08: qty 3.5

## 2013-02-08 MED ORDER — OSELTAMIVIR PHOSPHATE 6 MG/ML PO SUSR
75.0000 mg | Freq: Two times a day (BID) | ORAL | Status: DC
  Administered 2013-02-08 (×2): 75 mg via ORAL
  Filled 2013-02-08 (×4): qty 12.5

## 2013-02-08 MED ORDER — ALBUTEROL SULFATE HFA 108 (90 BASE) MCG/ACT IN AERS
8.0000 | INHALATION_SPRAY | RESPIRATORY_TRACT | Status: DC
  Administered 2013-02-08: 8 via RESPIRATORY_TRACT
  Filled 2013-02-08: qty 6.7

## 2013-02-08 MED ORDER — CISATRACURIUM BOLUS VIA INFUSION
2.5000 mg | Freq: Once | INTRAVENOUS | Status: AC
  Administered 2013-02-08: 2.5 mg via INTRAVENOUS
  Filled 2013-02-08: qty 3

## 2013-02-08 MED ORDER — PANTOPRAZOLE SODIUM 40 MG PO PACK
40.0000 mg | PACK | Freq: Every day | ORAL | Status: DC
  Administered 2013-02-08 – 2013-02-17 (×10): 40 mg
  Filled 2013-02-08 (×10): qty 20

## 2013-02-08 MED ORDER — OSMOLITE 1.2 CAL PO LIQD
1000.0000 mL | ORAL | Status: DC
  Filled 2013-02-08 (×3): qty 1000

## 2013-02-08 MED ORDER — ALBUTEROL (5 MG/ML) CONTINUOUS INHALATION SOLN
10.0000 mg/h | INHALATION_SOLUTION | RESPIRATORY_TRACT | Status: DC
  Administered 2013-02-08: 10 mg/h via RESPIRATORY_TRACT
  Filled 2013-02-08: qty 20

## 2013-02-08 MED ORDER — PRO-STAT SUGAR FREE PO LIQD
30.0000 mL | Freq: Four times a day (QID) | ORAL | Status: DC
  Administered 2013-02-08 – 2013-02-12 (×17): 30 mL
  Filled 2013-02-08 (×19): qty 30

## 2013-02-08 MED ORDER — HEPARIN SODIUM (PORCINE) 5000 UNIT/ML IJ SOLN
5000.0000 [IU] | Freq: Three times a day (TID) | INTRAMUSCULAR | Status: DC
  Administered 2013-02-08 – 2013-02-21 (×37): 5000 [IU] via SUBCUTANEOUS
  Filled 2013-02-08 (×43): qty 1

## 2013-02-08 MED ORDER — OSMOLITE 1.2 CAL PO LIQD
1000.0000 mL | ORAL | Status: DC
  Administered 2013-02-08 – 2013-02-12 (×2): 1000 mL
  Filled 2013-02-08 (×4): qty 1000

## 2013-02-08 MED ORDER — SODIUM CHLORIDE 0.9 % IV BOLUS (SEPSIS)
1000.0000 mL | Freq: Once | INTRAVENOUS | Status: AC
  Administered 2013-02-08: 1000 mL via INTRAVENOUS

## 2013-02-08 MED ORDER — SODIUM CHLORIDE 0.9 % IV SOLN
25.0000 ug/h | INTRAVENOUS | Status: DC
  Administered 2013-02-08: 100 ug/h via INTRAVENOUS
  Administered 2013-02-08 – 2013-02-09 (×2): 200 ug/h via INTRAVENOUS
  Administered 2013-02-10 – 2013-02-11 (×5): 300 ug/h via INTRAVENOUS
  Administered 2013-02-11 – 2013-02-13 (×5): 350 ug/h via INTRAVENOUS
  Filled 2013-02-08 (×15): qty 50

## 2013-02-08 MED ORDER — ALBUTEROL SULFATE (5 MG/ML) 0.5% IN NEBU
2.5000 mg | INHALATION_SOLUTION | RESPIRATORY_TRACT | Status: DC
  Administered 2013-02-08 – 2013-02-10 (×22): 2.5 mg via RESPIRATORY_TRACT
  Filled 2013-02-08 (×21): qty 0.5

## 2013-02-08 MED ORDER — ALBUTEROL SULFATE HFA 108 (90 BASE) MCG/ACT IN AERS: 6.0000 | INHALATION_SPRAY | RESPIRATORY_TRACT | Status: DC | PRN

## 2013-02-08 NOTE — Procedures (Signed)
Central Venous Catheter Insertion Procedure Note Kathleen Rodriguez [id number] November 22, 1965  Procedure: Insertion of Central Venous Catheter Indications: Assessment of intravascular volume, Drug and/or fluid administration and Frequent blood sampling  Procedure Details Consent: Risks of procedure as well as the alternatives and risks of each were explained to the (patient/caregiver).  Consent for procedure obtained. Time Out: Verified patient identification, verified procedure, site/side was marked, verified correct patient position, special equipment/implants available, medications/allergies/relevent history reviewed, required imaging and test results available.  Performed  Maximum sterile technique was used including antiseptics, cap, gloves, gown, hand hygiene, mask and sheet. Skin prep: Chlorhexidine; local anesthetic administered A antimicrobial bonded/coated triple lumen catheter was placed in the right internal jugular vein using the Seldinger technique.  Ultrasound was used to verify the patency of the vein and for real time needle guidance.  Evaluation Blood flow good Complications: No apparent complications Patient did tolerate procedure well. Chest X-ray ordered to verify placement.  CXR: pending.  Signed:  Dow Adolph, MD PGY-1 Internal Medicine Teaching Service Pager: [phone] 02/08/2013, 11:37 AM   ' US guidance Tolerated well perfromed with resident  Kathleen Rodriguez. Kathleen Alias, MD, FACP Pgr: [phone] [facility]

## 2013-02-08 NOTE — Progress Notes (Signed)
ANTIBIOTIC CONSULT NOTE - INITIAL  Pharmacy Consult for Vancomycin, Levofloxicin, Tamiflu Indication: pneumonia, Flu positive  Allergies  Allergen Reactions  . Other Hives and Itching    Ketchup  . Tomato Hives and Itching    Patient Measurements: Height: 5\' 4"  (162.6 cm) Weight: 275 lb 12.7 oz (125.1 kg) IBW/kg (Calculated) : 54.7 Adjusted Body Weight: 75kkg  Vital Signs: Temp: 98.3 F (36.8 C) (04/18 1217) Temp src: Oral (04/18 1217) BP: 123/53 mmHg (04/18 1115) Pulse Rate: 106 (04/18 1130) Intake/Output from previous day: 04/17 0701 - 04/18 0700 In: 3968.7 [P.O.:480; I.V.:3388.7; IV Piggyback:100] Out: 950 [Urine:950] Intake/Output from this shift: Total I/O In: 891.1 [I.V.:691.1; IV Piggyback:200] Out: 10 [Urine:10]  Labs:  Recent Labs  02/05/13 1740 02/07/13 1527 02/08/13 0415  WBC 5.7 14.5* 13.6*  HGB 13.3 13.1 12.4  PLT 248 221 231  CREATININE 0.80 0.92 1.68*   Estimated Creatinine Clearance: 54.2 ml/min (by C-G formula based on Cr of 1.68). No results found for this basename: VANCOTROUGH, VANCOPEAK, VANCORANDOM, GENTTROUGH, GENTPEAK, GENTRANDOM, TOBRATROUGH, TOBRAPEAK, TOBRARND, AMIKACINPEAK, AMIKACINTROU, AMIKACIN,  in the last 72 hours   Microbiology: Recent Results (from the past 720 hour(s))  URINE CULTURE     Status: None   Collection Time    02/07/13  2:21 PM      Result Value Range Status   Specimen Description URINE, CATHETERIZED   Final   Special Requests NONE   Final   Culture  Setup Time 02/07/2013 15:27   Final   Colony Count NO GROWTH   Final   Culture NO GROWTH   Final   Report Status 02/08/2013 FINAL   Final  CULTURE, BLOOD (ROUTINE X 2)     Status: None   Collection Time    02/07/13  3:16 PM      Result Value Range Status   Specimen Description BLOOD HAND LEFT   Final   Special Requests BOTTLES DRAWN AEROBIC AND ANAEROBIC 10CC   Final   Culture  Setup Time 02/07/2013 22:14   Final   Culture     Final   Value:        BLOOD  CULTURE RECEIVED NO GROWTH TO DATE CULTURE WILL BE HELD FOR 5 DAYS BEFORE ISSUING A FINAL NEGATIVE REPORT   Report Status PENDING   Incomplete  CULTURE, BLOOD (ROUTINE X 2)     Status: None   Collection Time    02/07/13  3:26 PM      Result Value Range Status   Specimen Description BLOOD HAND RIGHT   Final   Special Requests BOTTLES DRAWN AEROBIC AND ANAEROBIC 10CC   Final   Culture  Setup Time 02/07/2013 22:14   Final   Culture     Final   Value:        BLOOD CULTURE RECEIVED NO GROWTH TO DATE CULTURE WILL BE HELD FOR 5 DAYS BEFORE ISSUING A FINAL NEGATIVE REPORT   Report Status PENDING   Incomplete    Medical History: Past Medical History  Diagnosis Date  . Arthritis   . Asthma     Medications:  Prescriptions prior to admission  Medication Sig Dispense Refill  . albuterol (PROVENTIL HFA;VENTOLIN HFA) 108 (90 BASE) MCG/ACT inhaler Inhale 2 puffs into the lungs every 6 (six) hours as needed for wheezing.      . predniSONE (DELTASONE) 10 MG tablet Take 10 mg by mouth daily. Patient took for three days.       Assessment: 47 yo F with asthma  and SOB, intubated 4/17 and noted to be influenza positive.  Pharmacy consulted to dose vanc, levofloxacin and adjust tamiflu  Events: minimal UOP presently  ID: Flu+, CAP Antibiotics: 4/17 Tamiflu 4/18 Levofloxacin 4/18 Vancomycin  Goal of Therapy:  Vancomycin trough level 15-20 mcg/ml & renal adjustment of antibiotics  Plan:  1. Levofloxacin 750 mg IV q48h 2. Vancomycin 2500 mg IV x1 then anticipate 1750 mg IV q48h, evaluate renal function 4/19 before ordering 3. Anticipate tamiflu dose reduction will be needed to q24h but given body size will continue q12h for now 4. Follow up SCr, UOP, cultures, clinical course and adjust as clinically indicated.  Thank you for allowing pharmacy to be a part of this patients care team.  Lovenia Kim Pharm.D., BCPS Clinical Pharmacist 02/08/2013 1:01 PM Pager: [phone] Phone: [phone]

## 2013-02-08 NOTE — Consult Note (Addendum)
PULMONARY  / CRITICAL CARE MEDICINE  Name: Kathleen Rodriguez MRN: [id number] DOB: Sep 30, 1966    ADMISSION DATE:  02/05/2013 CONSULTATION DATE:  02/07/2013  REFERRING MD :  [facility] PRIMARY SERVICE: [facility]  CHIEF COMPLAINT:  SOB  BRIEF PATIENT DESCRIPTION: 47 yo BF with hx of asthma, arthritis, and obesity admitted on 4/15 with SOB in significant respiratory distress. Patient had a negative CT pulmonary angiogram on 4/15. 4/16 patient came back positive with Influenza B and started on Tamiflu on 4/17. Patient intubated 4/17 in acute respiratory distress.  SIGNIFICANT EVENTS / STUDIES:  4/15-Chest CT negative 4/16-Influenza B positive 4/17-Started on Tamiflu 4/18- autopeep, bronchospasm  LINES / TUBES: 4/17- ETT >>>  CULTURES: 4/17 trach aspirate >> 4/17-BC >> 4/17 UC >>  ANTIBIOTICS: 4/15-Azithromycin>>>4/17 4/15 Cleocin>>>4/16 Oseltamivir 4/17 >>   SUBJECTIVE: autopeep, tight  VITAL SIGNS: Temp:  [96.6 F (35.9 C)-98 F (36.7 C)] 98 F (36.7 C) (04/18 0416) Pulse Rate:  [50-128] 106 (04/18 0600) Resp:  [11-24] 14 (04/18 0600) BP: (88-164)/(32-115) 116/50 mmHg (04/18 0600) SpO2:  [88 %-100 %] 96 % (04/18 0600) FiO2 (%):  [40 %-100 %] 40 % (04/18 0650) Weight:  [124.7 kg (274 lb 14.6 oz)-125.1 kg (275 lb 12.7 oz)] 125.1 kg (275 lb 12.7 oz) (04/18 0500) HEMODYNAMICS:   VENTILATOR SETTINGS: Vent Mode:  [-] PRVC FiO2 (%):  [40 %-100 %] 40 % Set Rate:  [14 bmp-16 bmp] 14 bmp Vt Set:  [440 mL-550 mL] 550 mL PEEP:  [5 cmH20] 5 cmH20 Plateau Pressure:  [26 cmH20-36 cmH20] 36 cmH20 INTAKE / OUTPUT: Intake/Output     04/17 0701 - 04/18 0700 04/18 0701 - 04/19 0700   P.O. 480    I.V. (mL/kg) 3015.8 (24.1)    IV Piggyback 100    Total Intake(mL/kg) 3595.8 (28.7)    Urine (mL/kg/hr) 950 (0.3)    Total Output 950     Net +2645.8          Stool Occurrence 1 x      PHYSICAL EXAMINATION: General:  Obese Black female intubated and sedated Neuro:  Sedated on vent HEENT:  PERRL. ETT. No JVD. Cardiovascular:  s1 s 2 Tachycardic mild , Regular rhythm Lungs:  Diffuse prolonged expiratory wheezing, poor air entry Abdomen:  Normal bowel sounds.  Musculoskeletal:  No obvious deformities.  Skin:  Warm to the touch. No rashes  LABS:  Recent Labs Lab 02/05/13 1740  02/07/13 1527  02/07/13 2044 02/08/13 0104 02/08/13 0415 02/08/13 0631  HGB 13.3  --  13.1  --   --   --  12.4  --   WBC 5.7  --  14.5*  --   --   --  13.6*  --   PLT 248  --  221  --   --   --  231  --   NA 134*  --  139  --   --   --  138  --   K 3.7  --  4.8  --   --   --  3.8  --   CL 100  --  106  --   --   --  102  --   CO2 22  --  24  --   --   --  25  --   GLUCOSE 103*  --  132*  --   --   --  163*  --   BUN 11  --  14  --   --   --  21  --   CREATININE 0.80  --  0.92  --   --   --  1.68*  --   CALCIUM 8.7  --  8.4  --   --   --  8.4  --   AST  --   --  28  --   --   --   --   --   ALT  --   --  27  --   --   --   --   --   ALKPHOS  --   --  49  --   --   --   --   --   BILITOT  --   --  0.1*  --   --   --   --   --   PROT  --   --  7.5  --   --   --   --   --   ALBUMIN  --   --  3.3*  --   --   --   --   --   TROPONINI <0.30  --   --   --   --   --   --   --   PHART  --   < >  --   < > 7.175* 7.195*  --  7.108*  PCO2ART  --   < >  --   < > 66.8* 68.4*  --  85.5*  PO2ART  --   < >  --   < > 146.0* 170.0*  --  87.0  < > = values in this interval not displayed.  Recent Labs Lab 02/07/13 1532 02/07/13 1934 02/07/13 2350 02/08/13 0415  GLUCAP 138* 146* 173* 149*    CXR: hazy LLL, ett wnl, no air in mediastinum  ASSESSMENT / PLAN:  PULMONARY A: Acute Respiratory Failure Status asthmaticus Ventilator dyssynchrony Permissive hypercapnia P:   Vent settings established Systemic steroids, continue and nicrease x 24 hrs Nebulized BDs, continuous started for 1 hr- 2 hr Follow pcx rin am  allow ph noted, would prefer to 7.2 Rate 14, remain, TV 550 may in fact need slight  reduction if plat rise further, reluctant with current ph autopeep off neb actuallys minimal now, no role peep treatment paralsysis start, avoid long term with steroids Assess lactic acid  CARDIOVASCULAR A: Sinus tachycardia  P:  Monitor Maintain deep rass Consider cvp  RENAL A: Acute renal failure P:   Renal US cvp assessment Volume Dc k in bag  GASTROINTESTINAL A:  No acute findings P:  H2 blocker for stress ulcers NPO; possibly start now lft again in am   HEMATOLOGIC A:  DVt prevention P:  CBC pending DVT prophylaxis with Lovenox-dc with double crt, add sub q hep  INFECTIOUS A:  Influenza B, jhazzy LL? SUp infection? P:   Cont Tamiflu for 5 days- 10 days, dose down with renals Waiting on BC, sputum culture, and UC Monitor fever curve Add ABX, vanc, levofloxacin, ceftraixone Assess hiv  ENDOCRINE A:  No Acute Findings  P:  SSI on systemic steroids  NEUROLOGIC A:  Sedated, severe vent dyschrony, acidosis P:   Cont sedation with fentanyl and propofyl to RASS -3 to -4 Add paralysis  TODAY'S SUMMARY:    I have personally obtained a history, examined the patient, evaluated laboratory and imaging results, formulated the assessment and plan and placed orders. CRITICAL CARE: The patient is critically ill with multiple organ systems failure and  requires high complexity decision making for assessment and support, frequent evaluation and titration of therapies, application of advanced monitoring technologies and extensive interpretation of multiple databases. Critical Care Time devoted to patient care services described in this note is 45 minutes.   Mcarthur Rossetti. Tyson Alias, MD, FACP Pgr: [phone] [facility]

## 2013-02-08 NOTE — Procedures (Signed)
Arterial Catheter Insertion Procedure Note Kirah Stice [id number] 1966/03/02  Procedure: Insertion of Arterial Catheter  Indications: Blood pressure monitoring and Frequent blood sampling  Procedure Details Consent: Risks of procedure as well as the alternatives and risks of each were explained to the (patient/caregiver).  Consent for procedure obtained. Time Out: Verified patient identification, verified procedure, site/side was marked, verified correct patient position, special equipment/implants available, medications/allergies/relevent history reviewed, required imaging and test results available.  Performed  Maximum sterile technique was used including antiseptics. Skin prep: Chlorhexidine; local anesthetic administered 20 gauge catheter was inserted into left radial artery using the Seldinger technique.  Evaluation Blood flow good; BP tracing good. Complications: No apparent complications.   Newt Lukes 02/08/2013

## 2013-02-08 NOTE — Progress Notes (Signed)
INITIAL NUTRITION ASSESSMENT  DOCUMENTATION CODES Per approved criteria  -Morbid Obesity   INTERVENTION: 1. Initiate Osmolite 1.2 @ 10 ml/hr via tube. 30 ml Prostat qid.  At goal rate, tube feeding regimen will provide 688 kcal, 73 grams of protein, and 182 ml of H2O.   Enteral nutrition to provide 60-70% of estimated calorie needs (22-25 kcals/kg ideal body weight) and 100% of estimated protein needs, based on ASPEN guidelines for permissive underfeeding in critically ill obese individuals.  Last bm: none recorded  NUTRITION DIAGNOSIS: Inadequate oral intake related to respiratory failure as evidenced by NPO status.   Goal: Pt to meet >/= 90% of their estimated nutrition needs  Monitor:  Weight, initiation of tube feeding, vent settings  Reason for Assessment: Consult  47 y.o. female  Admitting Dx: Acute respiratory failure  ASSESSMENT: Pt in acute respiratory failure and is currently intubated. Spoke with nurse regarding current status. Reported that propofol will remain in place and that tube feeding will be initiated. Ordered rate of Osmolite 1.2 in adult enteral protocol was 40 ml/hr. This, with current propofol regimen, would provide 2108 kcal which is above her estimated need of 1300-1600 kcal based on the ASPEN guidelines for permissive underfeeding in critically ill obese individuals. Rate was ordered by dietitian to be reduced to 10 ml per hour with 30 ml prostat qid to better meet needs.  Height: Ht Readings from Last 1 Encounters:  02/07/13 5\' 4"  (1.626 m)    Weight: Wt Readings from Last 1 Encounters:  02/08/13 275 lb 12.7 oz (125.1 kg)    Ideal Body Weight: 63.8 kg  % Ideal Body Weight: 196%  Wt Readings from Last 10 Encounters:  02/08/13 275 lb 12.7 oz (125.1 kg)  01/23/13 270 lb (122.471 kg)  10/26/12 260 lb (117.935 kg)  06/29/12 25 lb (11.34 kg)  05/31/12 273 lb (123.832 kg)    Usual Body Weight: unknown  % Usual Body Weight: unknown  BMI:   Body mass index is 47.32 kg/(m^2).  Estimated Nutritional Needs: Kcal: 2277 Protein: 160 g Fluid: >2.2 L  Skin: WNL  Diet Order: NPO  EDUCATION NEEDS: -Education not appropriate at this time   Intake/Output Summary (Last 24 hours) at 02/08/13 1317 Last data filed at 02/08/13 1100  Gross per 24 hour  Intake 4329.83 ml  Output    460 ml  Net 3869.83 ml    Last BM: none recorded   Labs:   Recent Labs Lab 02/05/13 1740 02/07/13 1527 02/08/13 0415  NA 134* 139 138  K 3.7 4.8 3.8  CL 100 106 102  CO2 22 24 25   BUN 11 14 21   CREATININE 0.80 0.92 1.68*  CALCIUM 8.7 8.4 8.4  GLUCOSE 103* 132* 163*    CBG (last 3)   Recent Labs  02/08/13 0415 02/08/13 0748 02/08/13 1217  GLUCAP 149* 126* 134*    Scheduled Meds: . albuterol  2.5 mg Nebulization Q2H  . antiseptic oral rinse  15 mL Mouth Rinse QID  . artificial tears  1 application Both Eyes Q8H  . cefTRIAXone (ROCEPHIN)  IV  1 g Intravenous Q24H  . chlorhexidine  15 mL Mouth Rinse BID  . cisatracurium  2.5 mg Intravenous Once  . famotidine (PEPCID) IV  20 mg Intravenous Q24H  . feeding supplement  30 mL Per Tube QID  . heparin subcutaneous  5,000 Units Subcutaneous Q8H  . insulin aspart  0-15 Units Subcutaneous Q4H  . levofloxacin (LEVAQUIN) IV  750 mg Intravenous Q48H  .  methylPREDNISolone (SOLU-MEDROL) injection  125 mg Intravenous Q6H  . oseltamivir  75 mg Oral BID  . pantoprazole sodium  40 mg Per Tube Daily  . sodium chloride  3 mL Intravenous Q12H  . vancomycin  2,500 mg Intravenous Once    Continuous Infusions: . sodium chloride 150 mL/hr at 02/08/13 0855  . cisatracurium (NIMBEX) infusion    . feeding supplement (OSMOLITE 1.2 CAL)    . fentaNYL infusion INTRAVENOUS 200 mcg/hr (02/08/13 0400)  . propofol 50 mcg/kg/min (02/08/13 1103)    Past Medical History  Diagnosis Date  . Arthritis   . Asthma     Past Surgical History  Procedure Laterality Date  . Tubal ligation    . Cesarean  section      Ebbie Latus RD, LDN

## 2013-02-09 ENCOUNTER — Inpatient Hospital Stay (HOSPITAL_COMMUNITY): Payer: Managed Care, Other (non HMO)

## 2013-02-09 LAB — POCT I-STAT 3, ART BLOOD GAS (G3+)
Acid-base deficit: 8 mmol/L — ABNORMAL HIGH (ref 0.0–2.0)
Acid-base deficit: 9 mmol/L — ABNORMAL HIGH (ref 0.0–2.0)
Bicarbonate: 21 mEq/L (ref 20.0–24.0)
Bicarbonate: 20.6 mEq/L (ref 20.0–24.0)
O2 Saturation: 93 %
O2 Saturation: 95 %
Patient temperature: 95
Patient temperature: 98.6
TCO2: 23 mmol/L (ref 0–100)
pCO2 arterial: 68 mmHg (ref 35.0–45.0)
pCO2 arterial: 67.7 mmHg (ref 35.0–45.0)
pCO2 arterial: 59 mmHg (ref 35.0–45.0)
pH, Arterial: 7.119 — CL (ref 7.350–7.450)
pH, Arterial: 7.1 — CL (ref 7.350–7.450)
pO2, Arterial: 93 mmHg (ref 80.0–100.0)
pO2, Arterial: 86 mmHg (ref 80.0–100.0)

## 2013-02-09 LAB — BASIC METABOLIC PANEL
CO2: 21 mEq/L (ref 19–32)
CO2: 20 mEq/L (ref 19–32)
Chloride: 102 mEq/L (ref 96–112)
Creatinine, Ser: 3.36 mg/dL — ABNORMAL HIGH (ref 0.50–1.10)
Glucose, Bld: 133 mg/dL — ABNORMAL HIGH (ref 70–99)
Potassium: 4.7 mEq/L (ref 3.5–5.1)
Sodium: 132 mEq/L — ABNORMAL LOW (ref 135–145)
Sodium: 133 mEq/L — ABNORMAL LOW (ref 135–145)

## 2013-02-09 LAB — GLUCOSE, CAPILLARY
Glucose-Capillary: 110 mg/dL — ABNORMAL HIGH (ref 70–99)
Glucose-Capillary: 129 mg/dL — ABNORMAL HIGH (ref 70–99)
Glucose-Capillary: 140 mg/dL — ABNORMAL HIGH (ref 70–99)

## 2013-02-09 LAB — PROTIME-INR: INR: 1.14 (ref 0.00–1.49)

## 2013-02-09 LAB — CBC WITH DIFFERENTIAL/PLATELET
Eosinophils Relative: 0 % (ref 0–5)
Hemoglobin: 10.6 g/dL — ABNORMAL LOW (ref 12.0–15.0)
Lymphocytes Relative: 7 % — ABNORMAL LOW (ref 12–46)
Lymphs Abs: 0.6 10*3/uL — ABNORMAL LOW (ref 0.7–4.0)
MCV: 86.9 fL (ref 78.0–100.0)
Monocytes Relative: 8 % (ref 3–12)
Platelets: 178 10*3/uL (ref 150–400)
RBC: 3.82 MIL/uL — ABNORMAL LOW (ref 3.87–5.11)
WBC: 9.2 10*3/uL (ref 4.0–10.5)

## 2013-02-09 LAB — COMPREHENSIVE METABOLIC PANEL
Albumin: 2.7 g/dL — ABNORMAL LOW (ref 3.5–5.2)
BUN: 33 mg/dL — ABNORMAL HIGH (ref 6–23)
Calcium: 7.5 mg/dL — ABNORMAL LOW (ref 8.4–10.5)
Chloride: 102 mEq/L (ref 96–112)
Creatinine, Ser: 2.96 mg/dL — ABNORMAL HIGH (ref 0.50–1.10)
Total Bilirubin: 0.1 mg/dL — ABNORMAL LOW (ref 0.3–1.2)
Total Protein: 6 g/dL (ref 6.0–8.3)

## 2013-02-09 MED ORDER — METHYLPREDNISOLONE SODIUM SUCC 125 MG IJ SOLR
80.0000 mg | Freq: Four times a day (QID) | INTRAMUSCULAR | Status: DC
  Administered 2013-02-09: 80 mg via INTRAVENOUS
  Administered 2013-02-09: 17:00:00 via INTRAVENOUS
  Administered 2013-02-10 (×2): 80 mg via INTRAVENOUS
  Filled 2013-02-09 (×8): qty 1.28

## 2013-02-09 MED ORDER — OSELTAMIVIR PHOSPHATE 6 MG/ML PO SUSR
75.0000 mg | Freq: Two times a day (BID) | ORAL | Status: DC
  Administered 2013-02-09: 75 mg
  Filled 2013-02-09 (×3): qty 12.5

## 2013-02-09 MED ORDER — OSELTAMIVIR PHOSPHATE 6 MG/ML PO SUSR
75.0000 mg | Freq: Every day | ORAL | Status: DC
  Administered 2013-02-09: 75 mg
  Filled 2013-02-09: qty 12.5

## 2013-02-09 MED ORDER — VANCOMYCIN HCL 10 G IV SOLR
1750.0000 mg | INTRAVENOUS | Status: DC
  Administered 2013-02-09: 1750 mg via INTRAVENOUS
  Filled 2013-02-09 (×2): qty 1750

## 2013-02-09 NOTE — Progress Notes (Signed)
eLink Physician-Brief Progress Note Patient Name: Kathleen Rodriguez DOB: [birth year] MRN: [id number]  Date of Service  02/09/2013   HPI/Events of Note   Respiratory acidosis   eICU Interventions  Increased respiratory rate to 22, with no change in peak pressures, continued complete emptying with return of flow and volume curves to baseline.    Intervention Category Major Interventions: Respiratory failure - evaluation and management  Annie Roseboom J 02/09/2013, 5:07 PM

## 2013-02-09 NOTE — Consult Note (Signed)
PULMONARY  / CRITICAL CARE MEDICINE  Name: Kathleen Rodriguez MRN: [id number] DOB: 1966-03-30    ADMISSION DATE:  02/05/2013 CONSULTATION DATE:  02/07/2013  REFERRING MD :  [facility] PRIMARY SERVICE: L[facility]M  CHIEF COMPLAINT:  SOB  BRIEF PATIENT DESCRIPTION: 47 yo BF with hx of asthma, arthritis, and obesity admitted on 4/15 with SOB in significant respiratory distress. Patient had a negative CT pulmonary angiogram on 4/15. 4/16 patient came back positive with Influenza B and started on Tamiflu on 4/17. Patient intubated 4/17 in acute respiratory distress.  SIGNIFICANT EVENTS / STUDIES:  4/15-Chest CT negative 4/16-Influenza B positive 4/17-Started on Tamiflu 4/18- autopeep, bronchospasm, paralyzed 4/18 - Renal US- no hydro 4/19- urine output improved after volume, maintaining permissive hypercapnia  LINES / TUBES: 4/17- ETT >>> 4.18 rt ij >>>  CULTURES: 4/17 trach aspirate >> 4/17-BC >> 4/17 UC >>  ANTIBIOTICS: 4/15-Azithromycin>>>4/17 4/15 Cleocin>>>4/16 Oseltamivir 4/17 >>   SUBJECTIVE: autopeep no, ph at goal, urine increased after volume   VITAL SIGNS: Temp:  [94 F (34.4 C)-98.9 F (37.2 C)] 94 F (34.4 C) (04/19 0400) Pulse Rate:  [78-108] 84 (04/19 0725) Resp:  [13-18] 18 (04/19 0725) BP: (103-134)/(47-70) 116/55 mmHg (04/19 0725) SpO2:  [96 %-100 %] 99 % (04/19 0725) Arterial Line BP: (105-149)/(50-76) 117/57 mmHg (04/19 0600) FiO2 (%):  [40 %] 40 % (04/19 0725) Weight:  [130.2 kg (287 lb 0.6 oz)] 130.2 kg (287 lb 0.6 oz) (04/19 0400) HEMODYNAMICS: CVP:  [16 mmHg-17 mmHg] 16 mmHg VENTILATOR SETTINGS: Vent Mode:  [-] PRVC FiO2 (%):  [40 %] 40 % Set Rate:  [14 bmp-18 bmp] 18 bmp Vt Set:  [550 mL] 550 mL PEEP:  [5 cmH20] 5 cmH20 Plateau Pressure:  [26 cmH20-35 cmH20] 26 cmH20 INTAKE / OUTPUT: Intake/Output     04/18 0701 - 04/19 0700 04/19 0701 - 04/20 0700   P.O.     I.V. (mL/kg) 5146.6 (39.5)    NG/GT 430    IV Piggyback 1200    Total Intake(mL/kg) 6776.6  (52)    Urine (mL/kg/hr) 735 (0.2)    Total Output 735     Net +6041.6            PHYSICAL EXAMINATION: General:  Obese Black female intubated and sedated, paralyzed Neuro:  Sedated rass -5, paralyzed HEENT: increased JVD. Cardiovascular:  s1 s 2 Tachycardic resolved  No r/g, no creptius chest Lungs:  Diffuse prolonged expiratory wheezing improved, distant Abdomen:  Normal bowel sounds, no r/g Musculoskeletal:  No obvious deformities.  Skin:  Warm to the touch. No rashes  LABS:  Recent Labs Lab 02/05/13 1740  02/07/13 1527  02/08/13 0415 02/08/13 0631 02/08/13 0842 02/08/13 1130 02/08/13 1351 02/08/13 1500 02/08/13 2218 02/09/13 0431 02/09/13 0435  HGB 13.3  --  13.1  --  12.4  --   --   --   --   --   --   --  10.6*  WBC 5.7  --  14.5*  --  13.6*  --   --   --   --   --   --   --  9.2  PLT 248  --  221  --  231  --   --   --   --   --   --   --  178  NA 134*  --  139  --  138  --   --   --   --  136 135  --  134*  K 3.7  --  4.8  --  3.8  --   --   --   --  3.9 3.9  --  3.8  CL 100  --  106  --  102  --   --   --   --  103 103  --  102  CO2 22  --  24  --  25  --   --   --   --  23 22  --  21  GLUCOSE 103*  --  132*  --  163*  --   --   --   --  111* 145*  --  173*  BUN 11  --  14  --  21  --   --   --   --  26* 30*  --  33*  CREATININE 0.80  --  0.92  --  1.68*  --   --   --   --  2.55* 2.77*  --  2.96*  CALCIUM 8.7  --  8.4  --  8.4  --   --   --   --  7.8* 7.6*  --  7.5*  AST  --   --  28  --   --   --   --   --   --   --   --   --  28  ALT  --   --  27  --   --   --   --   --   --   --   --   --  25  ALKPHOS  --   --  49  --   --   --   --   --   --   --   --   --  35*  BILITOT  --   --  0.1*  --   --   --   --   --   --   --   --   --  0.1*  PROT  --   --  7.5  --   --   --   --   --   --   --   --   --  6.0  ALBUMIN  --   --  3.3*  --   --   --   --   --   --   --   --   --  2.7*  LATICACIDVEN  --   --   --   --   --   --  1.4  --   --   --   --   --   --    TROPONINI <0.30  --   --   --   --   --   --   --   --   --   --   --   --   PHART  --   < >  --   < >  --  7.108*  --  7.097* 7.192*  --   --  7.182*  --   PCO2ART  --   < >  --   < >  --  85.5*  --  85.0* 66.3*  --   --  56.8*  --   PO2ART  --   < >  --   < >  --  87.0  --  84.0 130.0*  --   --  95.0  --   < > =  values in this interval not displayed.  Recent Labs Lab 02/08/13 1217 02/08/13 1508 02/08/13 1931 02/08/13 2349 02/09/13 0423  GLUCAP 134* 97 134* 131* 163*    CXR: no defined infiltrates, ett wnl, line wnl, no air med  ASSESSMENT / PLAN:  PULMONARY A: Acute Respiratory Failure Status asthmaticus Ventilator dyssynchrony Permissive hypercapnia Increased edema 4/19 P:   Ph noted, plat 29-30, will need Tv reduction,abg to follow, no autopeep noted Systemic steroids, continue Nebulized BDs, no continuous needed Follow pcx rin am  slow volume  CARDIOVASCULAR A: Sinus tachycardia, cvp 15 P:  Slow volume, some edema as well on pcxr Aline required  RENAL A: Acute renal failure P:   [place] reviewed cvp assessment 15, slow volume Chem i am   GASTROINTESTINAL A:  Tolerated TF P:  H2 blocker TF lft wnl  HEMATOLOGIC A:  DVt prevention P:  Renal failure, no lov sub q hep Cbc in am  coags now  INFECTIOUS A:  Influenza B,SUp infection? P:   Cont Tamiflu for 5 days- 10 days, dose adjust renals to qday Monitor fever curve Remain vanc, levofloxacin, ceftraixone Assess hiv- neg  ENDOCRINE A:  No Acute Findings  P:  SSI on systemic steroids  NEUROLOGIC A:  Sedated, severe vent dyschrony, acidosis P:   Cont sedation with fentanyl and propofyl to RASS -4 -5 Maintain paralysis  TODAY'S SUMMARY: maintain paralytic, slight reduction TV needed as plat up, continue aggressive BDer,s abg to follow, renal crt plat   I have personally obtained a history, examined the patient, evaluated laboratory and imaging results, formulated the assessment and plan and  placed orders. CRITICAL CARE: The patient is critically ill with multiple organ systems failure and requires high complexity decision making for assessment and support, frequent evaluation and titration of therapies, application of advanced monitoring technologies and extensive interpretation of multiple databases. Critical Care Time devoted to patient care services described in this note is 40 minutes.   Mcarthur Rossetti. Tyson Alias, MD, FACP Pgr: [phone] [facility]

## 2013-02-09 NOTE — Progress Notes (Signed)
ANTIBIOTIC CONSULT NOTE - FOLLOW UP  Pharmacy Consult for Vancomycin, Levofloxacin, Tamiflu Indication: rule out pneumonia, influenza positive  Allergies  Allergen Reactions  . Other Hives and Itching    Ketchup  . Tomato Hives and Itching    Patient Measurements: Height: 5\' 4"  (162.6 cm) Weight: 287 lb 0.6 oz (130.2 kg) IBW/kg (Calculated) : 54.7  Vital Signs: Temp: 97.8 Rodriguez (36.6 C) (04/19 0756) Temp src: Oral (04/19 0756) BP: 137/57 mmHg (04/19 1000) Pulse Rate: 104 (04/19 1000) Intake/Output from previous day: 04/18 0701 - 04/19 0700 In: 7044.1 [I.V.:5384.1; NG/GT:460; IV Piggyback:1200] Out: 735 [Urine:735] Intake/Output from this shift: Total I/O In: 510 [I.V.:400; NG/GT:60; IV Piggyback:50] Out: 220 [Urine:220]  Labs:  Recent Labs  02/07/13 1527 02/08/13 0415 02/08/13 1500 02/08/13 2218 02/09/13 0435  WBC 14.5* 13.6*  --   --  9.2  HGB 13.1 12.4  --   --  10.6*  PLT 221 231  --   --  178  CREATININE 0.92 1.Kathleen* 2.55* 2.77* 2.96*   Estimated Creatinine Clearance: 31.5 ml/min (by C-G formula based on Cr of 2.96). No results found for this basename: VANCOTROUGH, Leodis Binet, VANCORANDOM, GENTTROUGH, GENTPEAK, GENTRANDOM, TOBRATROUGH, TOBRAPEAK, TOBRARND, AMIKACINPEAK, AMIKACINTROU, AMIKACIN,  in the last 72 hours   Microbiology: Recent Results (from the past 720 hour(s))  URINE CULTURE     Status: None   Collection Time    02/07/13  2:21 PM      Result Value Range Status   Specimen Description URINE, CATHETERIZED   Final   Special Requests NONE   Final   Culture  Setup Time 02/07/2013 15:27   Final   Colony Count NO GROWTH   Final   Culture NO GROWTH   Final   Report Status 02/08/2013 FINAL   Final  CULTURE, BLOOD (ROUTINE X 2)     Status: None   Collection Time    02/07/13  3:16 PM      Result Value Range Status   Specimen Description BLOOD HAND LEFT   Final   Special Requests BOTTLES DRAWN AEROBIC AND ANAEROBIC 10CC   Final   Culture  Setup Time  02/07/2013 22:14   Final   Culture     Final   Value:        BLOOD CULTURE RECEIVED NO GROWTH TO DATE CULTURE WILL BE HELD FOR 5 DAYS BEFORE ISSUING A FINAL NEGATIVE REPORT   Report Status PENDING   Incomplete  CULTURE, BLOOD (ROUTINE X 2)     Status: None   Collection Time    02/07/13  3:26 PM      Result Value Range Status   Specimen Description BLOOD HAND RIGHT   Final   Special Requests BOTTLES DRAWN AEROBIC AND ANAEROBIC 10CC   Final   Culture  Setup Time 02/07/2013 22:14   Final   Culture     Final   Value:        BLOOD CULTURE RECEIVED NO GROWTH TO DATE CULTURE WILL BE HELD FOR 5 DAYS BEFORE ISSUING A FINAL NEGATIVE REPORT   Report Status PENDING   Incomplete    Anti-infectives   Start     Dose/Rate Route Frequency Ordered Stop   02/09/13 1000  oseltamivir (TAMIFLU) 6 MG/ML suspension 75 mg     75 mg Per Tube Daily 02/09/13 0742     02/08/13 1000  oseltamivir (TAMIFLU) 6 MG/ML suspension 75 mg  Status:  Discontinued     75 mg Oral 2 times daily 02/08/13  1610 02/09/13 0742   02/08/13 0900  vancomycin (VANCOCIN) 2,500 mg in sodium chloride 0.9 % 500 mL IVPB     2,500 mg 250 mL/hr over 120 Minutes Intravenous  Once 02/08/13 0829 02/08/13 1507   02/08/13 0845  cefTRIAXone (ROCEPHIN) 1 g in dextrose 5 % 50 mL IVPB     1 g 100 mL/hr over 30 Minutes Intravenous Every 24 hours 02/08/13 0813     02/08/13 0830  levofloxacin (LEVAQUIN) IVPB 750 mg     750 mg 100 mL/hr over 90 Minutes Intravenous Every 48 hours 02/08/13 0829     02/06/13 1100  oseltamivir (TAMIFLU) capsule 75 mg  Status:  Discontinued     75 mg Oral 2 times daily 02/06/13 1033 02/08/13 0904   02/06/13 0000  clindamycin (CLEOCIN) capsule 300 mg  Status:  Discontinued     300 mg Oral 4 times per day 02/05/13 2323 02/06/13 1057   02/06/13 0000  azithromycin (ZITHROMAX) 500 mg in dextrose 5 % 250 mL IVPB  Status:  Discontinued     500 mg 250 mL/hr over 60 Minutes Intravenous Every 24 hours 02/05/13 2323 02/07/13 0545      Kathleen Rodriguez with asthma and SOB, intubated 4/17 and noted to be influenza positive. Pharmacy consulted to dose vanc, levofloxacin and adjust tamiflu  Events: UOP improved,   ID: Flu+, empiric CAP superinfection Antibiotics: 4/17 Tamiflu 4/18 Levofloxacin 4/18 Vancomycin 4/17 Ceftriaxone  Goal of Therapy:  Vancomycin trough level 15-20 mcg/ml & renal adjustment of antibiotics  Plan:  1. Levofloxacin 750 mg IV q48h 2. Vancomycin 1750 mg IV q24h, evaluate renal function closely 3. Tamiflu dose reduction at CrCl < 30 ml/min,  given body size CrCl just above threshold with improving UOP will continue q12h for now 4. Follow up SCr, UOP, cultures, clinical course and adjust as clinically indicated.  Gastrointestinal / Nutrition:  TF, famotidine  Neurology/MSK: Sedtated on vent: propofol, fent,cisaricurium, RASS -5 Plan: 1) Follow up Triglycerides with AML  Nephrology/Urology/Electrolytes: UOP improving, SCr remains elevated, CrCl 31 ml/min, follow for dose adjustments  Pulmonary:  Asthma: MP 80 IV q6h,  40% Fio2  PTA Medication Issues: Home Meds Not Ordered: Prednisone (on MP)  Best Practices: DVT Prophylaxis: SQ Heparin, IV Pepcid, Eye ointment, CHG   Thank you for allowing pharmacy to be a part of this patients care team.  Lovenia Kim Pharm.D., BCPS Clinical Pharmacist 02/09/2013 11:20 AM Pager: [phone] Phone: [phone]

## 2013-02-10 ENCOUNTER — Inpatient Hospital Stay (HOSPITAL_COMMUNITY): Payer: Managed Care, Other (non HMO)

## 2013-02-10 LAB — POCT I-STAT 3, ART BLOOD GAS (G3+)
Acid-base deficit: 9 mmol/L — ABNORMAL HIGH (ref 0.0–2.0)
Acid-base deficit: 7 mmol/L — ABNORMAL HIGH (ref 0.0–2.0)
Bicarbonate: 22.5 mEq/L (ref 20.0–24.0)
Bicarbonate: 23.5 mEq/L (ref 20.0–24.0)
O2 Saturation: 91 %
O2 Saturation: 93 %
Patient temperature: 98.7
Patient temperature: 98.2
Patient temperature: 98.8
pCO2 arterial: 58.7 mmHg (ref 35.0–45.0)
pH, Arterial: 7.202 — ABNORMAL LOW (ref 7.350–7.450)
pH, Arterial: 7.206 — ABNORMAL LOW (ref 7.350–7.450)
pO2, Arterial: 226 mmHg — ABNORMAL HIGH (ref 80.0–100.0)

## 2013-02-10 LAB — COMPREHENSIVE METABOLIC PANEL
AST: 29 U/L (ref 0–37)
Albumin: 2.7 g/dL — ABNORMAL LOW (ref 3.5–5.2)
BUN: 50 mg/dL — ABNORMAL HIGH (ref 6–23)
Calcium: 7.5 mg/dL — ABNORMAL LOW (ref 8.4–10.5)
Chloride: 102 mEq/L (ref 96–112)
Creatinine, Ser: 4.01 mg/dL — ABNORMAL HIGH (ref 0.50–1.10)
Total Protein: 5.9 g/dL — ABNORMAL LOW (ref 6.0–8.3)

## 2013-02-10 LAB — CBC WITH DIFFERENTIAL/PLATELET
Eosinophils Absolute: 0 10*3/uL (ref 0.0–0.7)
Eosinophils Relative: 0 % (ref 0–5)
Hemoglobin: 10.4 g/dL — ABNORMAL LOW (ref 12.0–15.0)
Lymphocytes Relative: 7 % — ABNORMAL LOW (ref 12–46)
Lymphs Abs: 0.7 10*3/uL (ref 0.7–4.0)
MCH: 28 pg (ref 26.0–34.0)
MCV: 85.5 fL (ref 78.0–100.0)
Monocytes Relative: 13 % — ABNORMAL HIGH (ref 3–12)
RBC: 3.72 MIL/uL — ABNORMAL LOW (ref 3.87–5.11)
WBC: 9.8 10*3/uL (ref 4.0–10.5)

## 2013-02-10 LAB — BASIC METABOLIC PANEL
Calcium: 7 mg/dL — ABNORMAL LOW (ref 8.4–10.5)
Chloride: 95 mEq/L — ABNORMAL LOW (ref 96–112)
GFR calc Af Amer: 14 mL/min — ABNORMAL LOW (ref >90)
GFR calc Af Amer: 13 mL/min — ABNORMAL LOW (ref >90)
GFR calc non Af Amer: 12 mL/min — ABNORMAL LOW (ref >90)
GFR calc non Af Amer: 11 mL/min — ABNORMAL LOW (ref >90)
Potassium: 5.1 mEq/L (ref 3.5–5.1)
Potassium: 5.2 mEq/L — ABNORMAL HIGH (ref 3.5–5.1)
Sodium: 132 mEq/L — ABNORMAL LOW (ref 135–145)
Sodium: 129 mEq/L — ABNORMAL LOW (ref 135–145)

## 2013-02-10 LAB — TRIGLYCERIDES: Triglycerides: 158 mg/dL — ABNORMAL HIGH (ref <150)

## 2013-02-10 LAB — CK TOTAL AND CKMB (NOT AT ARMC): Relative Index: 2.1 (ref 0.0–2.5)

## 2013-02-10 LAB — GLUCOSE, CAPILLARY
Glucose-Capillary: 120 mg/dL — ABNORMAL HIGH (ref 70–99)
Glucose-Capillary: 150 mg/dL — ABNORMAL HIGH (ref 70–99)

## 2013-02-10 MED ORDER — METHYLPREDNISOLONE SODIUM SUCC 125 MG IJ SOLR
60.0000 mg | Freq: Four times a day (QID) | INTRAMUSCULAR | Status: DC
  Administered 2013-02-10 – 2013-02-12 (×8): 60 mg via INTRAVENOUS
  Filled 2013-02-10 (×12): qty 0.96

## 2013-02-10 MED ORDER — FUROSEMIDE 10 MG/ML IJ SOLN
40.0000 mg | Freq: Two times a day (BID) | INTRAMUSCULAR | Status: DC
  Administered 2013-02-10 – 2013-02-11 (×3): 40 mg via INTRAVENOUS
  Filled 2013-02-10 (×3): qty 4

## 2013-02-10 MED ORDER — SODIUM BICARBONATE 8.4 % IV SOLN
INTRAVENOUS | Status: DC
  Administered 2013-02-10 – 2013-02-11 (×4): via INTRAVENOUS
  Filled 2013-02-10 (×6): qty 150

## 2013-02-10 MED ORDER — OSELTAMIVIR PHOSPHATE 6 MG/ML PO SUSR
75.0000 mg | Freq: Every day | ORAL | Status: DC
  Administered 2013-02-10 – 2013-02-12 (×3): 75 mg
  Filled 2013-02-10 (×4): qty 12.5

## 2013-02-10 MED ORDER — SODIUM CHLORIDE 0.9 % IV SOLN
1.0000 mg/h | INTRAVENOUS | Status: DC
  Administered 2013-02-10: 3 mg/h via INTRAVENOUS
  Administered 2013-02-11 – 2013-02-13 (×4): 4 mg/h via INTRAVENOUS
  Filled 2013-02-10 (×7): qty 10

## 2013-02-10 MED ORDER — SODIUM CHLORIDE 0.9 % IV BOLUS (SEPSIS)
1000.0000 mL | Freq: Once | INTRAVENOUS | Status: AC
  Administered 2013-02-10: 1000 mL via INTRAVENOUS

## 2013-02-10 MED ORDER — OSELTAMIVIR PHOSPHATE 6 MG/ML PO SUSR: 75.0000 mg | Freq: Every day | ORAL | Status: DC

## 2013-02-10 MED ORDER — ALBUTEROL SULFATE (5 MG/ML) 0.5% IN NEBU
2.5000 mg | INHALATION_SOLUTION | RESPIRATORY_TRACT | Status: DC
  Administered 2013-02-10 – 2013-02-11 (×6): 2.5 mg via RESPIRATORY_TRACT
  Filled 2013-02-10 (×7): qty 0.5

## 2013-02-10 NOTE — Progress Notes (Signed)
PULMONARY  / CRITICAL CARE MEDICINE  Name: Kathleen Rodriguez MRN: [id number] DOB: 05-22-1966    ADMISSION DATE:  02/05/2013 CONSULTATION DATE:  02/07/2013  REFERRING MD :  [facility] PRIMARY SERVICE: [facility]  CHIEF COMPLAINT:  SOB  BRIEF PATIENT DESCRIPTION: 47 yo BF with hx of asthma, arthritis, and obesity admitted on 4/15 with SOB in significant respiratory distress. Patient had a negative CT pulmonary angiogram on 4/15. 4/16 patient came back positive with Influenza B and started on Tamiflu on 4/17. Patient intubated 4/17 in acute respiratory distress.  SIGNIFICANT EVENTS / STUDIES:  4/15-Chest CT negative 4/16-Influenza B positive 4/17-Started on Tamiflu 4/18- autopeep, bronchospasm, paralyzed 4/18 - Renal US- no hydro 4/19- urine output improved after volume, maintaining permissive hypercapnia  LINES / TUBES: 4/17- ETT >>> 4.18 rt ij >>>  CULTURES: 4/17 trach aspirate >> 4/17-BC >> 4/17 UC >> 4/16 Influenza B pos H1N1 , Influenza A - neg  ANTIBIOTICS: 4/15-Azithromycin>>>4/17 4/15 Cleocin>>>4/16 Oseltamivir 4/17 >>  4/18 ceftriaxone>>> 4/18 levofloxacin>>> 4/18 vanc>>>  SUBJECTIVE: rate elevated  VITAL SIGNS: Temp:  [97.8 F (36.6 C)-98.7 F (37.1 C)] 98.4 F (36.9 C) (04/20 0403) Pulse Rate:  [84-109] 97 (04/20 0600) Resp:  [18-30] 30 (04/20 0600) BP: (106-147)/(43-66) 129/47 mmHg (04/20 0600) SpO2:  [92 %-100 %] 100 % (04/20 0619) Arterial Line BP: (102-133)/(50-67) 119/61 mmHg (04/20 0600) FiO2 (%):  [30 %-40 %] 30 % (04/20 0619) Weight:  [137.2 kg (302 lb 7.5 oz)] 137.2 kg (302 lb 7.5 oz) (04/20 0500) HEMODYNAMICS:   VENTILATOR SETTINGS: Vent Mode:  [-] PRVC FiO2 (%):  [30 %-40 %] 30 % Set Rate:  [18 bmp-28 bmp] 28 bmp Vt Set:  [500 mL] 500 mL PEEP:  [5 cmH20] 5 cmH20 Plateau Pressure:  [25 cmH20-29 cmH20] 29 cmH20 INTAKE / OUTPUT: Intake/Output     04/19 0701 - 04/20 0700 04/20 0701 - 04/21 0700   I.V. (mL/kg) 3078.9 (22.4)    NG/GT 690    IV  Piggyback 600    Total Intake(mL/kg) 4368.9 (31.8)    Urine (mL/kg/hr) 985 (0.3)    Total Output 985     Net +3383.9            PHYSICAL EXAMINATION: General:  Obese Black female intubated and sedated, paralyzed Neuro:  Sedated rass -5, paralyzed HEENT: increased JVD Cardiovascular:  s1 s 2 RRR no m, no crepitus Lungs:  Distant diffuse Abdomen:  Normal bowel sounds, no r/g Musculoskeletal:  No obvious deformities.  Skin:  Warm to the touch. No rashes  LABS:  Recent Labs Lab 02/05/13 1740  02/07/13 1527  02/08/13 0415 02/08/13 0631 02/08/13 0842  02/09/13 0431 02/09/13 0435 02/09/13 0800  02/09/13 1725 02/09/13 1804 02/09/13 2059 02/09/13 2232 02/10/13 0251 02/10/13 0450  HGB 13.3  --  13.1  --  12.4  --   --   --   --  10.6*  --   --   --   --   --   --   --  10.4*  WBC 5.7  --  14.5*  --  13.6*  --   --   --   --  9.2  --   --   --   --   --   --   --  9.8  PLT 248  --  221  --  231  --   --   --   --  178  --   --   --   --   --   --   --  179  NA 134*  --  139  --  138  --   --   < >  --  134*  --   --  132*  --   --  133*  --  134*  K 3.7  --  4.8  --  3.8  --   --   < >  --  3.8  --   --  4.5  --   --  4.7  --  4.8  CL 100  --  106  --  102  --   --   < >  --  102  --   --  102  --   --  102  --  102  CO2 22  --  24  --  25  --   --   < >  --  21  --   --  21  --   --  20  --  20  GLUCOSE 103*  --  132*  --  163*  --   --   < >  --  173*  --   --  143*  --   --  133*  --  125*  BUN 11  --  14  --  21  --   --   < >  --  33*  --   --  37*  --   --  45*  --  50*  CREATININE 0.80  --  0.92  --  1.68*  --   --   < >  --  2.96*  --   --  3.36*  --   --  3.76*  --  4.01*  CALCIUM 8.7  --  8.4  --  8.4  --   --   < >  --  7.5*  --   --  7.2*  --   --  7.4*  --  7.5*  AST  --   --  28  --   --   --   --   --   --  28  --   --   --   --   --   --   --  29  ALT  --   --  27  --   --   --   --   --   --  25  --   --   --   --   --   --   --  25  ALKPHOS  --   --  49  --    --   --   --   --   --  35*  --   --   --   --   --   --   --  34*  BILITOT  --   --  0.1*  --   --   --   --   --   --  0.1*  --   --   --   --   --   --   --  0.1*  PROT  --   --  7.5  --   --   --   --   --   --  6.0  --   --   --   --   --   --   --  5.9*  ALBUMIN  --   --  3.3*  --   --   --   --   --   --  2.7*  --   --   --   --   --   --   --  2.7*  APTT  --   --   --   --   --   --   --   --   --   --  33  --   --   --   --   --   --   --   INR  --   --   --   --   --   --   --   --   --   --  1.14  --   --   --   --   --   --   --   LATICACIDVEN  --   --   --   --   --   --  1.4  --   --   --   --   --   --   --   --   --   --   --   TROPONINI <0.30  --   --   --   --   --   --   --   --   --   --   --   --   --   --   --   --   --   PHART  --   < >  --   < >  --  7.108*  --   < > 7.182*  --   --   < >  --  7.100* 7.151*  --  7.202*  --   PCO2ART  --   < >  --   < >  --  85.5*  --   < > 56.8*  --   --   < >  --  67.7* 59.0*  --  51.9*  --   PO2ART  --   < >  --   < >  --  87.0  --   < > 95.0  --   --   < >  --  105.0* 86.0  --  84.0  --   < > = values in this interval not displayed.  Recent Labs Lab 02/09/13 1141 02/09/13 1507 02/09/13 1923 02/09/13 2341 02/10/13 0347  GLUCAP 110* 129* 140* 130* 120*    CXR: cant eval LLL, no defined infiltrates, air left heart border?  ASSESSMENT / PLAN:  PULMONARY A: Acute Respiratory Failure Status asthmaticus / Influenza B pos Ventilator dyssynchrony Permissive hypercapnia Increased edema 4/19 CT chest 4/15 neg PE P:   Not coming back to baseline on flow  / time, would rather have ph 7.1-7.2 then risk autopeep / traunma Re reduce rate 22, maintain TV 500 for now, plat acceptable Start bicarb drip with similar ARDS management and ARF / risk hyperk as well abg to follow Solumedral, slight reduction BDErs from q2h to q4h Lasix today  CARDIOVASCULAR A: Overload component P:  kvo other than bicarb Lasix to neg 500 cc  balance Aline required  RENAL A: Acute renal failure P:   Add lasix Bicarb , see resp Chem in am  And PM  GASTROINTESTINAL A:  Tolerated TF P:  H2 blocker TF lft wnl  HEMATOLOGIC A:  DVt prevention P:  sub q hep  INFECTIOUS A:  Influenza B,SUper infection? P:   Cont Tamiflu for 5 days- 10 days, dose adjust per pharm down with crt  rise Dc vanc, ceftriaxone Maintain levo  ENDOCRINE A:  No Acute Findings  P:  SSI on systemic steroids, reduce  NEUROLOGIC A:  Sedated, severe vent dyschrony, acidosis P:   Cont sedation with fentanyl and propofyl to RASS -4 -5 Maintain paralysis, reduce steroids with risk combo myopathy  TODAY'S SUMMARY: maintain paralytic, autopeep, reduce rate, lasix, bicarb   I have personally obtained a history, examined the patient, evaluated laboratory and imaging results, formulated the assessment and plan and placed orders. CRITICAL CARE: The patient is critically ill with multiple organ systems failure and requires high complexity decision making for assessment and support, frequent evaluation and titration of therapies, application of advanced monitoring technologies and extensive interpretation of multiple databases. Critical Care Time devoted to patient care services described in this note is 40 minutes.   Mcarthur Rossetti. Tyson Alias, MD, FACP Pgr: [phone] [facility]

## 2013-02-10 NOTE — Progress Notes (Signed)
eLink Physician-Brief Progress Note Patient Name: Tressie Ragin DOB: [birth year] MRN: [id number]  Date of Service  02/10/2013   HPI/Events of Note   CK 728 with CK-MB 15.1. Patient on propofol since 02/07/13 at  Highest dose of . Also, acidotic. Also, renal failure since 02/08/13  Having mild rhabdo cVP N/A    eICU Interventions  D/w resident Isla Pence  - dc diprivan - cycle troponin  - recheck ck 02/11/13 - start sedation protocol with fentanyl/versed; use BIS monitor goal < 50 - 1L fluid liter bolus and recheck CVP    Intervention Category Major Interventions: Acid-Base disturbance - evaluation and management Intermediate Interventions: Medication change / dose adjustment  Tameyah Koch 02/10/2013, 9:44 PM

## 2013-02-10 NOTE — Progress Notes (Signed)
ICU resident brief note:   Event of note:  CK elevated to 728 in setting of propofol infusion   Plan: Discontinued propofol,            Started Versed drip           Continue Fentanyl drip    Discussed with Dr. Marchelle Gearing

## 2013-02-10 NOTE — Progress Notes (Signed)
ANTIBIOTIC CONSULT NOTE - FOLLOW UP  Pharmacy Consult for Levofloxacin, Tamiflu Indication: rule out pneumonia, influenza positive  Allergies  Allergen Reactions  . Other Hives and Itching    Ketchup  . Tomato Hives and Itching    Patient Measurements: Height: 5\' 4"  (162.6 cm) Weight: 302 lb 7.5 oz (137.2 kg) IBW/kg (Calculated) : 54.7  Vital Signs: Temp: 98.2 F (36.8 C) (04/20 0742) Temp src: Oral (04/20 0742) BP: 139/64 mmHg (04/20 0729) Pulse Rate: 94 (04/20 0729) Intake/Output from previous day: 04/19 0701 - 04/20 0700 In: 4368.9 [I.V.:3078.9; NG/GT:690; IV Piggyback:600] Out: 985 [Urine:985] Intake/Output from this shift:    Labs:  Recent Labs  02/08/13 0415  02/09/13 0435 02/09/13 1725 02/09/13 2232 02/10/13 0450  WBC 13.6*  --  9.2  --   --  9.8  HGB 12.4  --  10.6*  --   --  10.4*  PLT 231  --  178  --   --  179  CREATININE 1.68*  < > 2.96* 3.36* 3.76* 4.01*  < > = values in this interval not displayed. Estimated Creatinine Clearance: 24 ml/min (by C-G formula based on Cr of 4.01). No results found for this basename: VANCOTROUGH, Leodis Binet, VANCORANDOM, GENTTROUGH, GENTPEAK, GENTRANDOM, TOBRATROUGH, TOBRAPEAK, TOBRARND, AMIKACINPEAK, AMIKACINTROU, AMIKACIN,  in the last 72 hours   Microbiology: Recent Results (from the past 720 hour(s))  URINE CULTURE     Status: None   Collection Time    02/07/13  2:21 PM      Result Value Range Status   Specimen Description URINE, CATHETERIZED   Final   Special Requests NONE   Final   Culture  Setup Time 02/07/2013 15:27   Final   Colony Count NO GROWTH   Final   Culture NO GROWTH   Final   Report Status 02/08/2013 FINAL   Final  CULTURE, BLOOD (ROUTINE X 2)     Status: None   Collection Time    02/07/13  3:16 PM      Result Value Range Status   Specimen Description BLOOD HAND LEFT   Final   Special Requests BOTTLES DRAWN AEROBIC AND ANAEROBIC 10CC   Final   Culture  Setup Time 02/07/2013 22:14   Final    Culture     Final   Value:        BLOOD CULTURE RECEIVED NO GROWTH TO DATE CULTURE WILL BE HELD FOR 5 DAYS BEFORE ISSUING A FINAL NEGATIVE REPORT   Report Status PENDING   Incomplete  CULTURE, BLOOD (ROUTINE X 2)     Status: None   Collection Time    02/07/13  3:26 PM      Result Value Range Status   Specimen Description BLOOD HAND RIGHT   Final   Special Requests BOTTLES DRAWN AEROBIC AND ANAEROBIC 10CC   Final   Culture  Setup Time 02/07/2013 22:14   Final   Culture     Final   Value:        BLOOD CULTURE RECEIVED NO GROWTH TO DATE CULTURE WILL BE HELD FOR 5 DAYS BEFORE ISSUING A FINAL NEGATIVE REPORT   Report Status PENDING   Incomplete    Anti-infectives   Start     Dose/Rate Route Frequency Ordered Stop   02/11/13 1000  oseltamivir (TAMIFLU) 6 MG/ML suspension 75 mg  Status:  Discontinued     75 mg Per Tube Daily 02/10/13 1012 02/10/13 1015   02/10/13 1030  oseltamivir (TAMIFLU) 6 MG/ML suspension 75 mg  75 mg Per Tube Daily 02/10/13 1015     02/09/13 2200  oseltamivir (TAMIFLU) 6 MG/ML suspension 75 mg  Status:  Discontinued     75 mg Per Tube 2 times daily 02/09/13 1127 02/10/13 1012   02/09/13 1300  vancomycin (VANCOCIN) 1,750 mg in sodium chloride 0.9 % 500 mL IVPB  Status:  Discontinued     1,750 mg 250 mL/hr over 120 Minutes Intravenous Every 24 hours 02/09/13 1122 02/10/13 0733   02/09/13 1000  oseltamivir (TAMIFLU) 6 MG/ML suspension 75 mg  Status:  Discontinued     75 mg Per Tube Daily 02/09/13 0742 02/09/13 1127   02/08/13 1000  oseltamivir (TAMIFLU) 6 MG/ML suspension 75 mg  Status:  Discontinued     75 mg Oral 2 times daily 02/08/13 0905 02/09/13 0742   02/08/13 0900  vancomycin (VANCOCIN) 2,500 mg in sodium chloride 0.9 % 500 mL IVPB     2,500 mg 250 mL/hr over 120 Minutes Intravenous  Once 02/08/13 0829 02/08/13 1507   02/08/13 0845  cefTRIAXone (ROCEPHIN) 1 g in dextrose 5 % 50 mL IVPB  Status:  Discontinued     1 g 100 mL/hr over 30 Minutes Intravenous Every  24 hours 02/08/13 0813 02/10/13 0733   02/08/13 0830  levofloxacin (LEVAQUIN) IVPB 750 mg     750 mg 100 mL/hr over 90 Minutes Intravenous Every 48 hours 02/08/13 0829     02/06/13 1100  oseltamivir (TAMIFLU) capsule 75 mg  Status:  Discontinued     75 mg Oral 2 times daily 02/06/13 1033 02/08/13 0904   02/06/13 0000  clindamycin (CLEOCIN) capsule 300 mg  Status:  Discontinued     300 mg Oral 4 times per day 02/05/13 2323 02/06/13 1057   02/06/13 0000  azithromycin (ZITHROMAX) 500 mg in dextrose 5 % 250 mL IVPB  Status:  Discontinued     500 mg 250 mL/hr over 60 Minutes Intravenous Every 24 hours 02/05/13 2323 02/07/13 0545     47 yo F with asthma and SOB, intubated 4/17 and noted to be influenza positive. Pharmacy consulted to dose vanc, levofloxacin and adjust tamiflu  Events: UOP improved,   Anticoagulation:  VTE Px, SQ heparin  ID: Flu+, empiric CAP superinfection, afeb WBC 9.8 Antibiotics: 4/17 Tamiflu 4/18 Levofloxacin 4/18 Vancomycin >> 4/20 4/17 Ceftriaxone >> 4/20  Goal of Therapy:  Vancomycin trough level 15-20 mcg/ml & renal adjustment of antibiotics  Plan:  1. Levofloxacin 750 mg IV q48h 2. Decrease Tamiflu to q24h as SCr trend increases 4. Follow up SCr, UOP, cultures, clinical course and adjust as clinically indicated.  Gastrointestinal / Nutrition:  TF, protonix  Neurology/MSK: Sedtated on vent: propofol, fent,cisaricurium, RASS -5, Triglycerides 158  Nephrology/Urology/Electrolytes:  SCr remains elevated, CrCl 24 ml/min, 4636/985 (0.3 ml/kg/hr), starting bicarb ggt, giving lasix,   Pulmonary:  Asthma: MP 60 IV q6h (taperd today due to prolonged paralysis),  40% Fio2,  PTA Medication Issues: Home Meds Not Ordered: Prednisone (on MP)  Best Practices: DVT Prophylaxis: SQ Heparin, protonix, Eye ointment, CHG   Thank you for allowing pharmacy to be a part of this patients care team.  Lovenia Kim Pharm.D., BCPS Clinical Pharmacist 02/10/2013 10:45  AM Pager: [phone] Phone: [phone]

## 2013-02-10 NOTE — Progress Notes (Signed)
eLink Physician-Brief Progress Note Patient Name: Kathleen Rodriguez DOB: Feb 17, 1966 MRN: [id number]  Date of Service  02/10/2013   HPI/Events of Note   Recent Labs Lab 02/09/13 1804 02/09/13 2059 02/10/13 0251 02/10/13 0907 02/10/13 1746  PHART 7.100* 7.151* 7.202* 7.169* 7.170*  PCO2ART 67.7* 59.0* 51.9* 53.7* 61.7*  PO2ART 105.0* 86.0 84.0 76.0* 84.0  HCO3 21.0 20.6 20.4 19.6* 22.5  TCO2 23 22 22 21 24   O2SAT 95.0 93.0 93.0 91.0 93.0    Recent Labs Lab 02/09/13 0435 02/09/13 1725 02/09/13 2232 02/10/13 0450 02/10/13 1500  NA 134* 132* 133* 134* 132*  K 3.8 4.5 4.7 4.8 5.1  CL 102 102 102 102 100  CO2 21 21 20 20 20   GLUCOSE 173* 143* 133* 125* 132*  BUN 33* 37* 45* 50* 60*  CREATININE 2.96* 3.36* 3.76* 4.01* 4.08*  CALCIUM 7.5* 7.2* 7.4* 7.5* 7.0*   I/O last 3 completed shifts: In: 7770.8 [I.V.:6110.8; NG/GT:1060; IV Piggyback:600] Out: 1700 [Urine:1700]    eICU Interventions   patient is having persistent respiratory acidosis this acute setting of acute exacerbation asthma on the ventilator and acute renal failure that is steadily worsening. Her pH is some better with the bicarbonate infusion. She is making some urine   Plan  - Increase bicarbonate infusion rate from from 75 mL per hour to 125 mL per hour   - Check total CK in the setting of propofol infusion    Intervention Category Major Interventions: Acid-Base disturbance - evaluation and management  Charidy Cappelletti 02/10/2013, 5:49 PM

## 2013-02-11 ENCOUNTER — Inpatient Hospital Stay (HOSPITAL_COMMUNITY): Payer: Managed Care, Other (non HMO)

## 2013-02-11 LAB — POCT I-STAT 3, ART BLOOD GAS (G3+)
Acid-Base Excess: 1 mmol/L (ref 0.0–2.0)
Acid-base deficit: 1 mmol/L (ref 0.0–2.0)
O2 Saturation: 93 %
O2 Saturation: 99 %
Patient temperature: 97.3
Patient temperature: 98
Patient temperature: 98.7
TCO2: 32 mmol/L (ref 0–100)
TCO2: 22 mmol/L (ref 0–100)
pCO2 arterial: 71.1 mmHg (ref 35.0–45.0)
pCO2 arterial: 65.9 mmHg (ref 35.0–45.0)
pO2, Arterial: 66 mmHg — ABNORMAL LOW (ref 80.0–100.0)

## 2013-02-11 LAB — CBC WITH DIFFERENTIAL/PLATELET
Basophils Relative: 0 % (ref 0–1)
Eosinophils Absolute: 0 10*3/uL (ref 0.0–0.7)
Hemoglobin: 10.8 g/dL — ABNORMAL LOW (ref 12.0–15.0)
MCH: 28 pg (ref 26.0–34.0)
MCHC: 33.4 g/dL (ref 30.0–36.0)
Monocytes Absolute: 1.3 10*3/uL — ABNORMAL HIGH (ref 0.1–1.0)
Neutrophils Relative %: 82 % — ABNORMAL HIGH (ref 43–77)
Platelets: 145 10*3/uL — ABNORMAL LOW (ref 150–400)

## 2013-02-11 LAB — BASIC METABOLIC PANEL
BUN: 67 mg/dL — ABNORMAL HIGH (ref 6–23)
BUN: 73 mg/dL — ABNORMAL HIGH (ref 6–23)
BUN: 82 mg/dL — ABNORMAL HIGH (ref 6–23)
CO2: 30 mEq/L (ref 19–32)
Calcium: 7.1 mg/dL — ABNORMAL LOW (ref 8.4–10.5)
Chloride: 98 mEq/L (ref 96–112)
Creatinine, Ser: 4.16 mg/dL — ABNORMAL HIGH (ref 0.50–1.10)
GFR calc non Af Amer: 12 mL/min — ABNORMAL LOW (ref >90)
Glucose, Bld: 133 mg/dL — ABNORMAL HIGH (ref 70–99)
Glucose, Bld: 124 mg/dL — ABNORMAL HIGH (ref 70–99)
Glucose, Bld: 115 mg/dL — ABNORMAL HIGH (ref 70–99)
Potassium: 5.2 mEq/L — ABNORMAL HIGH (ref 3.5–5.1)
Potassium: 4.9 mEq/L (ref 3.5–5.1)
Sodium: 138 mEq/L (ref 135–145)

## 2013-02-11 LAB — LIPASE, BLOOD: Lipase: 24 U/L (ref 11–59)

## 2013-02-11 LAB — URINALYSIS, ROUTINE W REFLEX MICROSCOPIC
Bilirubin Urine: NEGATIVE
Glucose, UA: NEGATIVE mg/dL
Ketones, ur: NEGATIVE mg/dL
Specific Gravity, Urine: 1.014 (ref 1.005–1.030)
pH: 5 (ref 5.0–8.0)

## 2013-02-11 LAB — GLUCOSE, CAPILLARY
Glucose-Capillary: 126 mg/dL — ABNORMAL HIGH (ref 70–99)
Glucose-Capillary: 114 mg/dL — ABNORMAL HIGH (ref 70–99)
Glucose-Capillary: 122 mg/dL — ABNORMAL HIGH (ref 70–99)
Glucose-Capillary: 102 mg/dL — ABNORMAL HIGH (ref 70–99)

## 2013-02-11 LAB — C4 COMPLEMENT: Complement C4, Body Fluid: 47 mg/dL — ABNORMAL HIGH (ref 10–40)

## 2013-02-11 LAB — URINE MICROSCOPIC-ADD ON

## 2013-02-11 LAB — TROPONIN I: Troponin I: <0.3 ng/mL (ref <0.30)

## 2013-02-11 LAB — CLOSTRIDIUM DIFFICILE BY PCR: Toxigenic C. Difficile by PCR: NEGATIVE

## 2013-02-11 MED ORDER — LEVALBUTEROL HCL 1.25 MG/0.5ML IN NEBU
1.2500 mg | INHALATION_SOLUTION | RESPIRATORY_TRACT | Status: DC
  Administered 2013-02-11 – 2013-02-16 (×30): 1.25 mg via RESPIRATORY_TRACT
  Filled 2013-02-11 (×36): qty 0.5

## 2013-02-11 MED ORDER — FUROSEMIDE 10 MG/ML IJ SOLN: 60.0000 mg | Freq: Two times a day (BID) | INTRAMUSCULAR | Status: DC

## 2013-02-11 MED ORDER — MAGNESIUM SULFATE IN D5W 10-5 MG/ML-% IV SOLN
1.0000 g | Freq: Once | INTRAVENOUS | Status: AC
  Administered 2013-02-11: 1 g via INTRAVENOUS
  Filled 2013-02-11: qty 100

## 2013-02-11 MED ORDER — IPRATROPIUM BROMIDE 0.02 % IN SOLN
0.5000 mg | RESPIRATORY_TRACT | Status: DC
  Administered 2013-02-11: 0.5 mg via RESPIRATORY_TRACT
  Filled 2013-02-11: qty 2.5

## 2013-02-11 MED ORDER — VECURONIUM BROMIDE 10 MG IV SOLR: 7.0000 mg | INTRAVENOUS | Status: DC | PRN

## 2013-02-11 MED ORDER — IPRATROPIUM BROMIDE 0.02 % IN SOLN
0.5000 mg | Freq: Four times a day (QID) | RESPIRATORY_TRACT | Status: DC
  Administered 2013-02-11 – 2013-02-16 (×19): 0.5 mg via RESPIRATORY_TRACT
  Filled 2013-02-11 (×19): qty 2.5

## 2013-02-11 NOTE — Progress Notes (Signed)
eLink Physician-Brief Progress Note Patient Name: Kathleen Rodriguez DOB: 1966/08/24 MRN: [id number]  Date of Service  02/11/2013   HPI/Events of Note   Severe vent dyschrony   eICU Interventions  Abg, vec   Intervention Category Major Interventions: Hypoxemia - evaluation and management  Nelda Bucks. 02/11/2013, 8:07 PM

## 2013-02-11 NOTE — Progress Notes (Addendum)
PULMONARY  / CRITICAL CARE MEDICINE  Name: Kathleen Rodriguez MRN: [id number] DOB: 08-Oct-1966    ADMISSION DATE:  02/05/2013 CONSULTATION DATE:  02/07/2013  REFERRING MD :  [facility] PRIMARY SERVICE: [facility]  CHIEF COMPLAINT:  SOB  BRIEF PATIENT DESCRIPTION: 47 yo BF with hx of asthma, arthritis, and obesity admitted on 4/15 with SOB in significant respiratory distress. Patient had a negative CT pulmonary angiogram on 4/15. 4/16 patient came back positive with Influenza B and started on Tamiflu on 4/17. Patient intubated 4/17 in acute respiratory distress.  SIGNIFICANT EVENTS / STUDIES:  4/15-Chest CT negative 4/16-Influenza B positive 4/17-Tamiflu 4/18- autopeep, bronchospasm 4/18 - Renal US- no hydro 4/19- urine output improved after volume, maintaining permissive hypercapnia  LINES / TUBES: 4/17- ETT >>> 4/18 rt ij >>>  CULTURES: 4/17 trach aspirate >> 4/17-BC >> pending 4/17 UC >> Negative 4/16 Influenza B pos H1N1 , Influenza A - neg  ANTIBIOTICS: 4/15-Azithromycin>>>4/17 4/15 Cleocin>>>4/16 Oseltamivir 4/17 >> stop date 4/23 4/18 ceftriaxone>>> 4/20 4/18 levofloxacin>>> 4/18 vanc>>>4/20  SUBJECTIVE: rate elevated.  Patient remains critically ill.  VITAL SIGNS: Temp:  [97 F (36.1 C)-98.8 F (37.1 C)] 97 F (36.1 C) (04/21 0842) Pulse Rate:  [80-102] 80 (04/21 0800) Resp:  [17-22] 22 (04/21 0800) BP: (108-176)/(46-93) 153/73 mmHg (04/21 0800) SpO2:  [95 %-100 %] 97 % (04/21 0800) Arterial Line BP: (112-188)/(52-92) 169/83 mmHg (04/21 0800) FiO2 (%):  [30 %] 30 % (04/21 0945) Weight:  [307 lb 1.6 oz (139.3 kg)] 307 lb 1.6 oz (139.3 kg) (04/21 0431) HEMODYNAMICS: CVP:  [21 mmHg] 21 mmHg VENTILATOR SETTINGS: Vent Mode:  [-] PRVC FiO2 (%):  [30 %] 30 % Set Rate:  [14 bmp-22 bmp] 14 bmp Vt Set:  [500 mL-550 mL] 550 mL PEEP:  [5 cmH20] 5 cmH20 Plateau Pressure:  [23 cmH20-30 cmH20] 25 cmH20 INTAKE / OUTPUT: Intake/Output     04/20 0701 - 04/21 0700 04/21 0701 -  04/22 0700   I.V. (mL/kg) 4099.6 (29.4)    NG/GT 700    IV Piggyback 200    Total Intake(mL/kg) 4999.6 (35.9)    Urine (mL/kg/hr) 2635 (0.8)    Total Output 2635     Net +2364.6          Stool Occurrence 3 x      PHYSICAL EXAMINATION: General:  Obese Black female intubated and sedated, paralyzed, Husband at bedside. Neuro:  Sedated rass -5, paralyzed HEENT: increased JVD Cardiovascular:  s1 s 2 RRR no m, no crepitus Lungs:  Distant diffuse Abdomen:  Normal bowel sounds, no r/g Musculoskeletal:  No obvious deformities.  Skin:  Warm to the touch. No rashes  LABS:  Recent Labs Lab 02/05/13 1740  02/07/13 1527  02/08/13 0631 02/08/13 0842  02/09/13 0431 02/09/13 0435 02/09/13 0800  02/10/13 0450  02/10/13 1500 02/10/13 1746 02/10/13 1935 02/10/13 2146 02/11/13 0400 02/11/13 0456  HGB 13.3  --  13.1  < >  --   --   --   --  10.6*  --   --  10.4*  --   --   --   --   --  10.8*  --   WBC 5.7  --  14.5*  < >  --   --   --   --  9.2  --   --  9.8  --   --   --   --   --  10.5  --   PLT 248  --  221  < >  --   --   --   --  178  --   --  179  --   --   --   --   --  145*  --   NA 134*  --  139  < >  --   --   < >  --  134*  --   < > 134*  --  132*  --   --  129* 136  --   K 3.7  --  4.8  < >  --   --   < >  --  3.8  --   < > 4.8  --  5.1  --   --  5.2* 4.9  --   CL 100  --  106  < >  --   --   < >  --  102  --   < > 102  --  100  --   --  95* 98  --   CO2 22  --  24  < >  --   --   < >  --  21  --   < > 20  --  20  --   --  23 26  --   GLUCOSE 103*  --  132*  < >  --   --   < >  --  173*  --   < > 125*  --  132*  --   --  150* 133*  --   BUN 11  --  14  < >  --   --   < >  --  33*  --   < > 50*  --  60*  --   --  66* 67*  --   CREATININE 0.80  --  0.92  < >  --   --   < >  --  2.96*  --   < > 4.01*  --  4.08*  --   --  4.29* 4.22*  --   CALCIUM 8.7  --  8.4  < >  --   --   < >  --  7.5*  --   < > 7.5*  --  7.0*  --   --  7.2* 7.1*  --   AST  --   --  28  --   --   --   --    --  28  --   --  29  --   --   --   --   --   --   --   ALT  --   --  27  --   --   --   --   --  25  --   --  25  --   --   --   --   --   --   --   ALKPHOS  --   --  49  --   --   --   --   --  35*  --   --  34*  --   --   --   --   --   --   --   BILITOT  --   --  0.1*  --   --   --   --   --  0.1*  --   --  0.1*  --   --   --   --   --   --   --  PROT  --   --  7.5  --   --   --   --   --  6.0  --   --  5.9*  --   --   --   --   --   --   --   ALBUMIN  --   --  3.3*  --   --   --   --   --  2.7*  --   --  2.7*  --   --   --   --   --   --   --   APTT  --   --   --   --   --   --   --   --   --  33  --   --   --   --   --   --   --   --   --   INR  --   --   --   --   --   --   --   --   --  1.14  --   --   --   --   --   --   --   --   --   LATICACIDVEN  --   --   --   --   --  1.4  --   --   --   --   --   --   --   --   --   --   --   --   --   TROPONINI <0.30  --   --   --   --   --   --   --   --   --   --   --   --   --   --   --  <0.30 <0.30  --   PHART  --   < >  --   < > 7.108*  --   < > 7.182*  --   --   < >  --   < >  --  7.170* 7.206*  --   --  7.245*  PCO2ART  --   < >  --   < > 85.5*  --   < > 56.8*  --   --   < >  --   < >  --  61.7* 58.7*  --   --  62.2*  PO2ART  --   < >  --   < > 87.0  --   < > 95.0  --   --   < >  --   < >  --  84.0 226.0*  --   --  81.0  < > = values in this interval not displayed.  Recent Labs Lab 02/10/13 1506 02/10/13 1926 02/10/13 2349 02/11/13 0343 02/11/13 0801  GLUCAP 117* 150* 131* 126* 130*    CXR: cant eval LLL, no defined infiltrates, air left heart border?  ASSESSMENT / PLAN: Principal Problem:   Acute respiratory failure Active Problems:   Asthma exacerbation   Severe obesity (BMI >= 40)   Influenza B   Status asthmaticus   PULMONARY A: Acute Respiratory Failure Status asthmaticus / Influenza B pos Ventilator dyssynchrony Permissive hypercapnia Increased edema 4/19 CT chest 4/15 neg PE P:   Not coming back to baseline  on flow  / time, would rather have ph 7.1-7.2 then risk autopeep /  trauma Reduce rate to 14, and PEEP of 5, TV 550  Continue bicarb drip  Solumedral, slight reduction BDErs  q4h Magnesium 1g  Lasix 60 mg IV q12hr   CARDIOVASCULAR A: Overload component P:  kvo other than bicarb Cont Lasix to neg 500 cc balance, increase dose A-line required  RENAL A: Acute renal failure. Creatinine 4.22  P:   Continue with lasix Bicarb , see resp Renal consult and thanks  GASTROINTESTINAL A:  Tolerated TF P:  H2 blocker TF, liver function  HEMATOLOGIC A:  DVt prevention P:  sub q hep  INFECTIOUS A:  Influenza B,SUper infection? P:   Cont Tamiflu for 7 days, dose adjust per pharm for renal clearance  4/20 >>Off  Vanc and ceftriaxone after  Maintain Levaquin   ENDOCRINE A:  No Acute Findings  P:  SSI on systemic steroids, reduce  NEUROLOGIC A:  Sedated, severe vent dyschrony, acidosis P:   Cont sedation with fentanyl and versed to RASS -3 goal Dc Nimbex  TODAY'S SUMMARY: 47 yo AAF with status asthmaticus d/t influenza B, ARF . Plan 4/21: dc Nimbex, autopeep, reduce rate, lasix, bicarb, renal consult   Signed:  Dow Adolph, MD PGY-1 Internal Medicine Teaching Service Pager: [phone] 02/11/2013, 12:04 PM   CC 40 min   Dorcas Carrow Beeper  [phone]  Cell  [phone]  If no response or cell goes to voicemail, call beeper [phone]

## 2013-02-11 NOTE — Progress Notes (Signed)
Clinical Social Worker received phone call from Licensed conveyancer indicating pt's spouse requesting to speak with CSW.  When CSW arrived to unit, no family at bedside.  CSW attempted to phone spouse at both numbers listed.  CSW left voicemail message on cell phone; house phone remained busy after several attempts.  CSW to continue to follow and assist as needed.    Angelia Mould, MSW, [place] [phone]

## 2013-02-11 NOTE — Consult Note (Signed)
Reason for Consult:AKI  Referring Physician: Dr. Asa Lente is an 47 y.o. female.  HPI: 47 yr old female with hx asthma and arthritis presented to [facility] with worsening cough, SOB and wheezing on 1/15. Initally responded to tx and then got worse.  Had contrasted CT.  Initial Cr .82.  Transferred here and was initially managed on floor, and SD, but worsened on 4/18 and entub. Cr has steadily risen since on admit and today is 4.02.  No known hx renal disease, HTN, or DM.  Was on Naproxen prior to admit.  Intial UA on 4/17 >300mg % proteinuria and TNTC RBC.  No hemotysis and no difficulty with O2 but has CO2 retention.  Has document Influ on admit. And is on Tamiflu.  And AB. Review of systems not obtained due to patient factors.  Patient entub and paralyzed  Past Medical History  Diagnosis Date  . Arthritis   . Asthma     Past Surgical History  Procedure Laterality Date  . Tubal ligation    . Cesarean section      History reviewed. No pertinent family history.  Social History:  reports that she has quit smoking. She has never used smokeless tobacco. She reports that she does not drink alcohol or use illicit drugs.  Allergies:  Allergies  Allergen Reactions  . Other Hives and Itching    Ketchup  . Tomato Hives and Itching    Medications:  I have reviewed the patient's current medications. Prior to Admission:  Prescriptions prior to admission  Medication Sig Dispense Refill  . albuterol (PROVENTIL HFA;VENTOLIN HFA) 108 (90 BASE) MCG/ACT inhaler Inhale 2 puffs into the lungs every 6 (six) hours as needed for wheezing.      . predniSONE (DELTASONE) 10 MG tablet Take 10 mg by mouth daily. Patient took for three days.        Results for orders placed during the hospital encounter of 02/05/13 (from the past 48 hour(s))  GLUCOSE, CAPILLARY     Status: Abnormal   Collection Time    02/09/13  3:07 PM      Result Value Range   Glucose-Capillary 129 (*) 70 - 99 mg/dL  BASIC  METABOLIC PANEL     Status: Abnormal   Collection Time    02/09/13  5:25 PM      Result Value Range   Sodium 132 (*) 135 - 145 mEq/L   Potassium 4.5  3.5 - 5.1 mEq/L   Chloride 102  96 - 112 mEq/L   CO2 21  19 - 32 mEq/L   Glucose, Bld 143 (*) 70 - 99 mg/dL   BUN 37 (*) 6 - 23 mg/dL   Creatinine, Ser 0.45 (*) 0.50 - 1.10 mg/dL   Calcium 7.2 (*) 8.4 - 10.5 mg/dL   GFR calc non Af Amer 15 (*) >90 mL/min   GFR calc Af Amer 18 (*) >90 mL/min   Comment:            The eGFR has been calculated     using the CKD EPI equation.     This calculation has not been     validated in all clinical     situations.     eGFR's persistently     <90 mL/min signify     possible Chronic Kidney Disease.  POCT I-STAT 3, BLOOD GAS (G3+)     Status: Abnormal   Collection Time    02/09/13  6:04 PM  Result Value Range   pH, Arterial 7.100 (*) 7.350 - 7.450   pCO2 arterial 67.7 (*) 35.0 - 45.0 mmHg   pO2, Arterial 105.0 (*) 80.0 - 100.0 mmHg   Bicarbonate 21.0  20.0 - 24.0 mEq/L   TCO2 23  0 - 100 mmol/L   O2 Saturation 95.0     Acid-base deficit 9.0 (*) 0.0 - 2.0 mmol/L   Patient temperature 98.6 F     Collection site ARTERIAL LINE     Drawn by Operator     Sample type ARTERIAL    GLUCOSE, CAPILLARY     Status: Abnormal   Collection Time    02/09/13  7:23 PM      Result Value Range   Glucose-Capillary 140 (*) 70 - 99 mg/dL   Comment 1 Notify RN    POCT I-STAT 3, BLOOD GAS (G3+)     Status: Abnormal   Collection Time    02/09/13  8:59 PM      Result Value Range   pH, Arterial 7.151 (*) 7.350 - 7.450   pCO2 arterial 59.0 (*) 35.0 - 45.0 mmHg   pO2, Arterial 86.0  80.0 - 100.0 mmHg   Bicarbonate 20.6  20.0 - 24.0 mEq/L   TCO2 22  0 - 100 mmol/L   O2 Saturation 93.0     Acid-base deficit 9.0 (*) 0.0 - 2.0 mmol/L   Patient temperature 98.4 F     Collection site ARTERIAL LINE     Drawn by RT     Sample type ARTERIAL     Comment NOTIFIED PHYSICIAN    BASIC METABOLIC PANEL     Status:  Abnormal   Collection Time    02/09/13 10:32 PM      Result Value Range   Sodium 133 (*) 135 - 145 mEq/L   Potassium 4.7  3.5 - 5.1 mEq/L   Chloride 102  96 - 112 mEq/L   CO2 20  19 - 32 mEq/L   Glucose, Bld 133 (*) 70 - 99 mg/dL   BUN 45 (*) 6 - 23 mg/dL   Creatinine, Ser 1.61 (*) 0.50 - 1.10 mg/dL   Calcium 7.4 (*) 8.4 - 10.5 mg/dL   GFR calc non Af Amer 13 (*) >90 mL/min   GFR calc Af Amer 15 (*) >90 mL/min   Comment:            The eGFR has been calculated     using the CKD EPI equation.     This calculation has not been     validated in all clinical     situations.     eGFR's persistently     <90 mL/min signify     possible Chronic Kidney Disease.  GLUCOSE, CAPILLARY     Status: Abnormal   Collection Time    02/09/13 11:41 PM      Result Value Range   Glucose-Capillary 130 (*) 70 - 99 mg/dL   Comment 1 Notify RN    POCT I-STAT 3, BLOOD GAS (G3+)     Status: Abnormal   Collection Time    02/10/13 12:03 AM      Result Value Range   pH, Arterial 7.173 (*) 7.350 - 7.450   pCO2 arterial 56.4 (*) 35.0 - 45.0 mmHg   pO2, Arterial 186.0 (*) 80.0 - 100.0 mmHg   Bicarbonate 20.7  20.0 - 24.0 mEq/L   TCO2 22  0 - 100 mmol/L   O2 Saturation 99.0  Acid-base deficit 8.0 (*) 0.0 - 2.0 mmol/L   Patient temperature 98.7 F     Collection site ARTERIAL LINE     Drawn by RT     Sample type ARTERIAL     Comment NOTIFIED PHYSICIAN    POCT I-STAT 3, BLOOD GAS (G3+)     Status: Abnormal   Collection Time    02/10/13  2:51 AM      Result Value Range   pH, Arterial 7.202 (*) 7.350 - 7.450   pCO2 arterial 51.9 (*) 35.0 - 45.0 mmHg   pO2, Arterial 84.0  80.0 - 100.0 mmHg   Bicarbonate 20.4  20.0 - 24.0 mEq/L   TCO2 22  0 - 100 mmol/L   O2 Saturation 93.0     Acid-base deficit 8.0 (*) 0.0 - 2.0 mmol/L   Patient temperature 98.7 F     Collection site ARTERIAL LINE     Drawn by RT     Sample type ARTERIAL    GLUCOSE, CAPILLARY     Status: Abnormal   Collection Time    02/10/13   3:47 AM      Result Value Range   Glucose-Capillary 120 (*) 70 - 99 mg/dL  COMPREHENSIVE METABOLIC PANEL     Status: Abnormal   Collection Time    02/10/13  4:50 AM      Result Value Range   Sodium 134 (*) 135 - 145 mEq/L   Potassium 4.8  3.5 - 5.1 mEq/L   Chloride 102  96 - 112 mEq/L   CO2 20  19 - 32 mEq/L   Glucose, Bld 125 (*) 70 - 99 mg/dL   BUN 50 (*) 6 - 23 mg/dL   Creatinine, Ser 1.61 (*) 0.50 - 1.10 mg/dL   Calcium 7.5 (*) 8.4 - 10.5 mg/dL   Total Protein 5.9 (*) 6.0 - 8.3 g/dL   Albumin 2.7 (*) 3.5 - 5.2 g/dL   AST 29  0 - 37 U/L   ALT 25  0 - 35 U/L   Alkaline Phosphatase 34 (*) 39 - 117 U/L   Total Bilirubin 0.1 (*) 0.3 - 1.2 mg/dL   GFR calc non Af Amer 12 (*) >90 mL/min   GFR calc Af Amer 14 (*) >90 mL/min   Comment:            The eGFR has been calculated     using the CKD EPI equation.     This calculation has not been     validated in all clinical     situations.     eGFR's persistently     <90 mL/min signify     possible Chronic Kidney Disease.  CBC WITH DIFFERENTIAL     Status: Abnormal   Collection Time    02/10/13  4:50 AM      Result Value Range   WBC 9.8  4.0 - 10.5 K/uL   RBC 3.72 (*) 3.87 - 5.11 MIL/uL   Hemoglobin 10.4 (*) 12.0 - 15.0 g/dL   HCT 09.6 (*) 04.5 - 40.9 %   MCV 85.5  78.0 - 100.0 fL   MCH 28.0  26.0 - 34.0 pg   MCHC 32.7  30.0 - 36.0 g/dL   RDW 81.1 (*) 91.4 - 78.2 %   Platelets 179  150 - 400 K/uL   Neutrophils Relative 80 (*) 43 - 77 %   Neutro Abs 7.8 (*) 1.7 - 7.7 K/uL   Lymphocytes Relative 7 (*) 12 - 46 %  Lymphs Abs 0.7  0.7 - 4.0 K/uL   Monocytes Relative 13 (*) 3 - 12 %   Monocytes Absolute 1.3 (*) 0.1 - 1.0 K/uL   Eosinophils Relative 0  0 - 5 %   Eosinophils Absolute 0.0  0.0 - 0.7 K/uL   Basophils Relative 0  0 - 1 %   Basophils Absolute 0.0  0.0 - 0.1 K/uL  TRIGLYCERIDES     Status: Abnormal   Collection Time    02/10/13  4:50 AM      Result Value Range   Triglycerides 158 (*) <150 mg/dL  GLUCOSE,  CAPILLARY     Status: Abnormal   Collection Time    02/10/13  7:10 AM      Result Value Range   Glucose-Capillary 125 (*) 70 - 99 mg/dL  POCT I-STAT 3, BLOOD GAS (G3+)     Status: Abnormal   Collection Time    02/10/13  9:07 AM      Result Value Range   pH, Arterial 7.169 (*) 7.350 - 7.450   pCO2 arterial 53.7 (*) 35.0 - 45.0 mmHg   pO2, Arterial 76.0 (*) 80.0 - 100.0 mmHg   Bicarbonate 19.6 (*) 20.0 - 24.0 mEq/L   TCO2 21  0 - 100 mmol/L   O2 Saturation 91.0     Acid-base deficit 9.0 (*) 0.0 - 2.0 mmol/L   Patient temperature 98.2 F     Collection site RADIAL, ALLEN'S TEST ACCEPTABLE     Sample type ARTERIAL    GLUCOSE, CAPILLARY     Status: Abnormal   Collection Time    02/10/13 11:18 AM      Result Value Range   Glucose-Capillary 128 (*) 70 - 99 mg/dL  BASIC METABOLIC PANEL     Status: Abnormal   Collection Time    02/10/13  3:00 PM      Result Value Range   Sodium 132 (*) 135 - 145 mEq/L   Potassium 5.1  3.5 - 5.1 mEq/L   Chloride 100  96 - 112 mEq/L   CO2 20  19 - 32 mEq/L   Glucose, Bld 132 (*) 70 - 99 mg/dL   BUN 60 (*) 6 - 23 mg/dL   Creatinine, Ser 4.09 (*) 0.50 - 1.10 mg/dL   Calcium 7.0 (*) 8.4 - 10.5 mg/dL   GFR calc non Af Amer 12 (*) >90 mL/min   GFR calc Af Amer 14 (*) >90 mL/min   Comment:            The eGFR has been calculated     using the CKD EPI equation.     This calculation has not been     validated in all clinical     situations.     eGFR's persistently     <90 mL/min signify     possible Chronic Kidney Disease.  CK TOTAL AND CKMB     Status: Abnormal   Collection Time    02/10/13  3:00 PM      Result Value Range   Total CK 728 (*) 7 - 177 U/L   CK, MB 15.1 (*) 0.3 - 4.0 ng/mL   Comment: CRITICAL RESULT CALLED TO, READ BACK BY AND VERIFIED WITH:     CITE,T RN 02/10/13 1825 WOOTEN,K   Relative Index 2.1  0.0 - 2.5  GLUCOSE, CAPILLARY     Status: Abnormal   Collection Time    02/10/13  3:06 PM      Result Value  Range    Glucose-Capillary 117 (*) 70 - 99 mg/dL  POCT I-STAT 3, BLOOD GAS (G3+)     Status: Abnormal   Collection Time    02/10/13  5:46 PM      Result Value Range   pH, Arterial 7.170 (*) 7.350 - 7.450   pCO2 arterial 61.7 (*) 35.0 - 45.0 mmHg   pO2, Arterial 84.0  80.0 - 100.0 mmHg   Bicarbonate 22.5  20.0 - 24.0 mEq/L   TCO2 24  0 - 100 mmol/L   O2 Saturation 93.0     Acid-base deficit 7.0 (*) 0.0 - 2.0 mmol/L   Patient temperature 98.8 F     Collection site RADIAL, ALLEN'S TEST ACCEPTABLE     Drawn by Operator     Sample type ARTERIAL     Comment NOTIFIED PHYSICIAN    GLUCOSE, CAPILLARY     Status: Abnormal   Collection Time    02/10/13  7:26 PM      Result Value Range   Glucose-Capillary 150 (*) 70 - 99 mg/dL   Comment 1 Notify RN    POCT I-STAT 3, BLOOD GAS (G3+)     Status: Abnormal   Collection Time    02/10/13  7:35 PM      Result Value Range   pH, Arterial 7.206 (*) 7.350 - 7.450   pCO2 arterial 58.7 (*) 35.0 - 45.0 mmHg   pO2, Arterial 226.0 (*) 80.0 - 100.0 mmHg   Bicarbonate 23.5  20.0 - 24.0 mEq/L   TCO2 25  0 - 100 mmol/L   O2 Saturation 100.0     Acid-base deficit 5.0 (*) 0.0 - 2.0 mmol/L   Patient temperature 97.5 F     Collection site ARTERIAL LINE     Drawn by RT     Sample type ARTERIAL     Comment NOTIFIED PHYSICIAN    BASIC METABOLIC PANEL     Status: Abnormal   Collection Time    02/10/13  9:46 PM      Result Value Range   Sodium 129 (*) 135 - 145 mEq/L   Potassium 5.2 (*) 3.5 - 5.1 mEq/L   Chloride 95 (*) 96 - 112 mEq/L   CO2 23  19 - 32 mEq/L   Glucose, Bld 150 (*) 70 - 99 mg/dL   BUN 66 (*) 6 - 23 mg/dL   Creatinine, Ser 1.61 (*) 0.50 - 1.10 mg/dL   Calcium 7.2 (*) 8.4 - 10.5 mg/dL   GFR calc non Af Amer 11 (*) >90 mL/min   GFR calc Af Amer 13 (*) >90 mL/min   Comment:            The eGFR has been calculated     using the CKD EPI equation.     This calculation has not been     validated in all clinical     situations.     eGFR's  persistently     <90 mL/min signify     possible Chronic Kidney Disease.  TROPONIN I     Status: None   Collection Time    02/10/13  9:46 PM      Result Value Range   Troponin I <0.30  <0.30 ng/mL   Comment:            Due to the release kinetics of cTnI,     a negative result within the first hours     of the onset of symptoms does not rule  out     myocardial infarction with certainty.     If myocardial infarction is still suspected,     repeat the test at appropriate intervals.  GLUCOSE, CAPILLARY     Status: Abnormal   Collection Time    02/10/13 11:49 PM      Result Value Range   Glucose-Capillary 131 (*) 70 - 99 mg/dL   Comment 1 Notify RN    CLOSTRIDIUM DIFFICILE BY PCR     Status: None   Collection Time    02/11/13  3:00 AM      Result Value Range   C difficile by pcr NEGATIVE  NEGATIVE  GLUCOSE, CAPILLARY     Status: Abnormal   Collection Time    02/11/13  3:43 AM      Result Value Range   Glucose-Capillary 126 (*) 70 - 99 mg/dL   Comment 1 Notify RN    BASIC METABOLIC PANEL     Status: Abnormal   Collection Time    02/11/13  4:00 AM      Result Value Range   Sodium 136  135 - 145 mEq/L   Potassium 4.9  3.5 - 5.1 mEq/L   Chloride 98  96 - 112 mEq/L   CO2 26  19 - 32 mEq/L   Glucose, Bld 133 (*) 70 - 99 mg/dL   BUN 67 (*) 6 - 23 mg/dL   Creatinine, Ser 1.61 (*) 0.50 - 1.10 mg/dL   Calcium 7.1 (*) 8.4 - 10.5 mg/dL   GFR calc non Af Amer 12 (*) >90 mL/min   GFR calc Af Amer 13 (*) >90 mL/min   Comment:            The eGFR has been calculated     using the CKD EPI equation.     This calculation has not been     validated in all clinical     situations.     eGFR's persistently     <90 mL/min signify     possible Chronic Kidney Disease.  CBC WITH DIFFERENTIAL     Status: Abnormal   Collection Time    02/11/13  4:00 AM      Result Value Range   WBC 10.5  4.0 - 10.5 K/uL   RBC 3.86 (*) 3.87 - 5.11 MIL/uL   Hemoglobin 10.8 (*) 12.0 - 15.0 g/dL   HCT 09.6  (*) 04.5 - 46.0 %   MCV 83.7  78.0 - 100.0 fL   MCH 28.0  26.0 - 34.0 pg   MCHC 33.4  30.0 - 36.0 g/dL   RDW 40.9 (*) 81.1 - 91.4 %   Platelets 145 (*) 150 - 400 K/uL   Neutrophils Relative 82 (*) 43 - 77 %   Lymphocytes Relative 6 (*) 12 - 46 %   Monocytes Relative 12  3 - 12 %   Eosinophils Relative 0  0 - 5 %   Basophils Relative 0  0 - 1 %   Neutro Abs 8.6 (*) 1.7 - 7.7 K/uL   Lymphs Abs 0.6 (*) 0.7 - 4.0 K/uL   Monocytes Absolute 1.3 (*) 0.1 - 1.0 K/uL   Eosinophils Absolute 0.0  0.0 - 0.7 K/uL   Basophils Absolute 0.0  0.0 - 0.1 K/uL   RBC Morphology POLYCHROMASIA PRESENT     WBC Morphology MILD LEFT SHIFT (1-5% METAS, OCC MYELO, OCC BANDS)    TROPONIN I     Status: None   Collection Time  02/11/13  4:00 AM      Result Value Range   Troponin I <0.30  <0.30 ng/mL   Comment:            Due to the release kinetics of cTnI,     a negative result within the first hours     of the onset of symptoms does not rule out     myocardial infarction with certainty.     If myocardial infarction is still suspected,     repeat the test at appropriate intervals.  CK TOTAL AND CKMB     Status: Abnormal   Collection Time    02/11/13  4:00 AM      Result Value Range   Total CK 730 (*) 7 - 177 U/L   CK, MB 13.7 (*) 0.3 - 4.0 ng/mL   Comment: CRITICAL VALUE NOTED.  VALUE IS CONSISTENT WITH PREVIOUSLY REPORTED AND CALLED VALUE.   Relative Index 1.9  0.0 - 2.5  LIPASE, BLOOD     Status: None   Collection Time    02/11/13  4:00 AM      Result Value Range   Lipase 24  11 - 59 U/L  POCT I-STAT 3, BLOOD GAS (G3+)     Status: Abnormal   Collection Time    02/11/13  4:56 AM      Result Value Range   pH, Arterial 7.245 (*) 7.350 - 7.450   pCO2 arterial 62.2 (*) 35.0 - 45.0 mmHg   pO2, Arterial 81.0  80.0 - 100.0 mmHg   Bicarbonate 27.2 (*) 20.0 - 24.0 mEq/L   TCO2 29  0 - 100 mmol/L   O2 Saturation 94.0     Acid-base deficit 1.0  0.0 - 2.0 mmol/L   Patient temperature 97.3 F      Collection site ARTERIAL LINE     Drawn by RT     Sample type ARTERIAL     Comment NOTIFIED PHYSICIAN    GLUCOSE, CAPILLARY     Status: Abnormal   Collection Time    02/11/13  8:01 AM      Result Value Range   Glucose-Capillary 130 (*) 70 - 99 mg/dL  POCT I-STAT 3, BLOOD GAS (G3+)     Status: Abnormal   Collection Time    02/11/13 10:55 AM      Result Value Range   pH, Arterial 7.235 (*) 7.350 - 7.450   pCO2 arterial 71.1 (*) 35.0 - 45.0 mmHg   pO2, Arterial 81.0  80.0 - 100.0 mmHg   Bicarbonate 30.3 (*) 20.0 - 24.0 mEq/L   TCO2 32  0 - 100 mmol/L   O2 Saturation 93.0     Acid-Base Excess 1.0  0.0 - 2.0 mmol/L   Patient temperature 98.0 F     Collection site RADIAL, ALLEN'S TEST ACCEPTABLE     Sample type ARTERIAL    GLUCOSE, CAPILLARY     Status: Abnormal   Collection Time    02/11/13 12:23 PM      Result Value Range   Glucose-Capillary 148 (*) 70 - 99 mg/dL    Dg Chest Port 1 View  02/11/2013  *RADIOLOGY REPORT*  Clinical Data: Shortness of breath  PORTABLE CHEST - 1 VIEW  Comparison: 02/10/2013  Findings: The cardiac shadow is stable.  The lungs are clear bilaterally.  An endotracheal tube, nasogastric catheter and central venous line are again seen and stable.  No focal infiltrate is seen.  No effusion is noted.  No bony abnormality is seen.  IMPRESSION: Tubes and lines as described.  No acute abnormality is noted.   Original Report Authenticated By: Alcide Clever, M.D.    Dg Chest Port 1 View  02/10/2013  *RADIOLOGY REPORT*  Clinical Data: Assess for mediastinal air.  PORTABLE CHEST - 1 VIEW  Comparison: 02/10/2013.  Findings: The support apparatus is stable.  The cardiac silhouette, mediastinal and hilar contours are unchanged.  The lungs are clear. No pleural effusion.  IMPRESSION:  1.  Stable support apparatus. 2.  No significant cardiopulmonary findings.   Original Report Authenticated By: Rudie Meyer, M.D.    Dg Chest Port 1 View  02/10/2013  *RADIOLOGY REPORT*  Clinical  Data: Evaluate endotracheal tube  PORTABLE CHEST - 1 VIEW  Comparison: Prior chest x-ray 02/09/2013  Findings: The endotracheal tube is in good position 5.4 cm above the carina.  The right IJ central venous catheter is unchanged with the tip in the distal SVC.  A nasogastric tube is incompletely imaged, the tip lies below the diaphragm presumably within the stomach.  No significant interval change in the appearance of the chest with pulmonary vascular congestion and mild interstitial edema.  Cardiomegaly is also unchanged.  No pneumothorax or new pulmonary opacity.  IMPRESSION:  1.  No significant interval change in the appearance of mild pulmonary edema and cardiomegaly.  2.  Stable and satisfactory support apparatus as above.   Original Report Authenticated By: Malachy Moan, M.D.     @ROS @ Blood pressure 187/89, pulse 84, temperature 97 F (36.1 C), temperature source Oral, resp. rate 14, height 5\' 4"  (1.626 m), weight 139.3 kg (307 lb 1.6 oz), last menstrual period 01/23/2013, SpO2 95.00%. @PHYSEXAMBYAGE2 @ Physical Examination: General appearance - sedated on vent, nonresponsive Mental status - uncooperative, as above Eyes - fundi ok Mouth - mucous membranes moist, pharynx normal without lesions and large tongue Neck - adenopathy noted PCL Lymphatics - posterior cervical nodes Chest - wheezing noted bilat, decreased air entry noted bilat Heart - normal rate, regular rhythm, normal S1, S2, no murmurs, rubs, clicks or gallops, S1 and S2 normal, systolic murmur Gr2/6 at apex Abdomen - soft, nontender, nondistended, no masses or organomegaly Obese, pos bs,liver down 6 cm Neurological - paralyzed Musculoskeletal - no joint tenderness, deformity or swelling Extremities - pedal edema 1+ + Skin - normal coloration and turgor, no rashes, no suspicious skin lesions noted  Assessment/Plan: 1 AKI secondary to contrast, ?proteinuria as risk.  R/o postinfectious, pulm/renal but doubt with no  hemoptysis, no prob with O2 and CXR.  Nonoliguric and only resp acidosis. Vol xs and HTN so limit vol. 2 Asthma per CCM 3 Hypertension: limit vol 4. Obesity 5. Arthrtis P limit IVF, HC03, allow to diurese. Recheck urine with proteinuria, check serologies.  Destin Kittler L 02/11/2013, 1:01 PM

## 2013-02-12 ENCOUNTER — Inpatient Hospital Stay (HOSPITAL_COMMUNITY): Payer: Managed Care, Other (non HMO)

## 2013-02-12 LAB — CBC
HCT: 32.9 % — ABNORMAL LOW (ref 36.0–46.0)
MCH: 27.7 pg (ref 26.0–34.0)
MCHC: 33.1 g/dL (ref 30.0–36.0)
MCV: 83.7 fL (ref 78.0–100.0)
RDW: 16.4 % — ABNORMAL HIGH (ref 11.5–15.5)

## 2013-02-12 LAB — GLOMERULAR BASEMENT MEMBRANE ANTIBODIES: GBM Ab: <1 AU/mL (ref <20)

## 2013-02-12 LAB — GLUCOSE, CAPILLARY
Glucose-Capillary: 107 mg/dL — ABNORMAL HIGH (ref 70–99)
Glucose-Capillary: 109 mg/dL — ABNORMAL HIGH (ref 70–99)
Glucose-Capillary: 119 mg/dL — ABNORMAL HIGH (ref 70–99)

## 2013-02-12 LAB — POCT I-STAT 3, ART BLOOD GAS (G3+)
Acid-Base Excess: 4 mmol/L — ABNORMAL HIGH (ref 0.0–2.0)
Bicarbonate: 33.1 mEq/L — ABNORMAL HIGH (ref 20.0–24.0)
Patient temperature: 98.6

## 2013-02-12 LAB — COMPREHENSIVE METABOLIC PANEL
Albumin: 2.9 g/dL — ABNORMAL LOW (ref 3.5–5.2)
BUN: 90 mg/dL — ABNORMAL HIGH (ref 6–23)
Calcium: 7.4 mg/dL — ABNORMAL LOW (ref 8.4–10.5)
Creatinine, Ser: 4.1 mg/dL — ABNORMAL HIGH (ref 0.50–1.10)
GFR calc Af Amer: 14 mL/min — ABNORMAL LOW (ref >90)
Glucose, Bld: 122 mg/dL — ABNORMAL HIGH (ref 70–99)
Total Protein: 6.7 g/dL (ref 6.0–8.3)

## 2013-02-12 LAB — URINE CULTURE

## 2013-02-12 LAB — ANA: Anti Nuclear Antibody(ANA): NEGATIVE

## 2013-02-12 LAB — PHOSPHORUS: Phosphorus: 9.1 mg/dL — ABNORMAL HIGH (ref 2.3–4.6)

## 2013-02-12 MED ORDER — NEPRO/CARBSTEADY PO LIQD
1000.0000 mL | ORAL | Status: DC
  Administered 2013-02-12 – 2013-02-14 (×3): 1000 mL
  Filled 2013-02-12 (×6): qty 1000

## 2013-02-12 MED ORDER — FUROSEMIDE 10 MG/ML IJ SOLN
40.0000 mg | Freq: Four times a day (QID) | INTRAMUSCULAR | Status: AC
  Administered 2013-02-12 (×2): 40 mg via INTRAVENOUS
  Filled 2013-02-12 (×2): qty 4

## 2013-02-12 MED ORDER — METHYLPREDNISOLONE SODIUM SUCC 40 MG IJ SOLR
40.0000 mg | Freq: Two times a day (BID) | INTRAMUSCULAR | Status: DC
  Administered 2013-02-12 – 2013-02-13 (×2): 40 mg via INTRAVENOUS
  Filled 2013-02-12 (×4): qty 1

## 2013-02-12 MED ORDER — PRO-STAT SUGAR FREE PO LIQD
30.0000 mL | Freq: Every day | ORAL | Status: DC
  Administered 2013-02-12 – 2013-02-17 (×24): 30 mL
  Filled 2013-02-12 (×28): qty 30

## 2013-02-12 NOTE — Progress Notes (Signed)
Event of note:   ABG reviewed. PH 7.237, CO2 77.9, pO2 99, bicarb 33  Pt is asthmatic, on permissive hypercapnia   Discussed with RT and with Dr. Oretha Milch, MD PGY1

## 2013-02-12 NOTE — Progress Notes (Addendum)
PULMONARY  / CRITICAL CARE MEDICINE  Name: Kathleen Rodriguez MRN: [id number] DOB: 22-Mar-1966    ADMISSION DATE:  02/05/2013 CONSULTATION DATE:  02/07/2013  REFERRING MD :  [facility] PRIMARY SERVICE: [facility]  CHIEF COMPLAINT:  SOB  BRIEF PATIENT DESCRIPTION: 47 yo BF with hx of asthma, arthritis, and obesity admitted on 4/15 with SOB in significant respiratory distress. Patient had a negative CT pulmonary angiogram on 4/15. 4/16 patient came back positive with Influenza B and started on Tamiflu on 4/17. Patient intubated 4/17 in acute respiratory distress.  SIGNIFICANT EVENTS / STUDIES:  4/15-Chest CT negative 4/16-Influenza B positive 4/17-Tamiflu 4/18- autopeep, bronchospasm 4/18 - Renal US- no hydro 4/19- urine output improved after volume, maintaining permissive hypercapnia  LINES / TUBES: 4/17- ETT >>> 4/18 rt ij >>>  CULTURES: 4/17 trach aspirate >> 4/17-BC >> pending 4/17 UC >> Negative 4/16 Influenza B pos H1N1 , Influenza A - neg  ANTIBIOTICS: 4/15-Azithromycin>>>4/17 4/15 Cleocin>>>4/16 Oseltamivir 4/17 >> stop date 4/23 4/18 ceftriaxone>>> 4/20 4/18 levofloxacin>>> 4/18 vanc>>>4/20  SUBJECTIVE:  Patient remains critically ill. Not able to lighten sedation  VITAL SIGNS: Temp:  [97 F (36.1 C)-99.2 F (37.3 C)] 98.7 F (37.1 C) (04/22 0342) Pulse Rate:  [77-111] 97 (04/22 0751) Resp:  [7-22] 15 (04/22 0751) BP: (127-187)/(65-103) 147/78 mmHg (04/22 0751) SpO2:  [91 %-100 %] 97 % (04/22 0700) Arterial Line BP: (149-205)/(76-97) 149/77 mmHg (04/22 0700) FiO2 (%):  [30 %-40 %] 40 % (04/22 0700) Weight:  [306 lb 10.6 oz (139.1 kg)] 306 lb 10.6 oz (139.1 kg) (04/22 0455) HEMODYNAMICS: CVP:  [25 mmHg] 25 mmHg VENTILATOR SETTINGS: Vent Mode:  [-] PRVC FiO2 (%):  [30 %-40 %] 40 % Set Rate:  [14 bmp] 14 bmp Vt Set:  [550 mL] 550 mL PEEP:  [5 cmH20] 5 cmH20 Plateau Pressure:  [18 cmH20-26 cmH20] 18 cmH20 Note less airtrapping 4/22 Vee 0-4  INTAKE /  OUTPUT: Intake/Output     04/21 0701 - 04/22 0700 04/22 0701 - 04/23 0700   I.V. (mL/kg) 1983.6 (14.3)    NG/GT 950    IV Piggyback 100    Total Intake(mL/kg) 3033.6 (21.8)    Urine (mL/kg/hr) 3035 (0.9)    Total Output 3035     Net -1.4            PHYSICAL EXAMINATION: General:  Obese Black female intubated and sedated, paralyzed. Neuro:  Sedated rass -3,  HEENT: increased JVD Cardiovascular:  s1 s 2 RRR no m, no crepitus Lungs:  Distant diffuse Abdomen:  Normal bowel sounds, no r/g Musculoskeletal:  No obvious deformities.  Skin:  Warm to the touch. No rashes  LABS:  Recent Labs Lab 02/08/13 0631 02/08/13 0842  02/09/13 0431 02/09/13 0435 02/09/13 0800  02/10/13 0450  02/10/13 2146 02/11/13 0400  02/11/13 1055 02/11/13 1346 02/11/13 1500 02/11/13 1958 02/11/13 2300 02/12/13 0500 02/12/13 0508  HGB  --   --   --   --  10.6*  --   --  10.4*  --   --  10.8*  --   --   --   --   --   --  10.9*  --   WBC  --   --   --   --  9.2  --   --  9.8  --   --  10.5  --   --   --   --   --   --  14.3*  --   PLT  --   --   --   --  178  --   --  179  --   --  145*  --   --   --   --   --   --  164  --   NA  --   --   < >  --  134*  --   < > 134*  < > 129* 136  --   --   --  138  --  138 139  --   K  --   --   < >  --  3.8  --   < > 4.8  < > 5.2* 4.9  --   --   --  5.2*  --  4.9 5.3*  --   CL  --   --   < >  --  102  --   < > 102  < > 95* 98  --   --   --  97  --  98 98  --   CO2  --   --   < >  --  21  --   < > 20  < > 23 26  --   --   --  30  --  30 32  --   GLUCOSE  --   --   < >  --  173*  --   < > 125*  < > 150* 133*  --   --   --  124*  --  115* 122*  --   BUN  --   --   < >  --  33*  --   < > 50*  < > 66* 67*  --   --   --  73*  --  82* 90*  --   CREATININE  --   --   < >  --  2.96*  --   < > 4.01*  < > 4.29* 4.22*  --   --   --  4.06*  --  4.16* 4.10*  --   CALCIUM  --   --   < >  --  7.5*  --   < > 7.5*  < > 7.2* 7.1*  --   --   --  7.4*  --  7.4* 7.4*  --   PHOS  --    --   --   --   --   --   --   --   --   --   --   --   --   --   --   --   --  9.1*  --   AST  --   --   --   --  28  --   --  29  --   --   --   --   --   --   --   --   --  72*  --   ALT  --   --   --   --  25  --   --  25  --   --   --   --   --   --   --   --   --  63*  --   ALKPHOS  --   --   --   --  35*  --   --  34*  --   --   --   --   --   --   --   --   --  38*  --   BILITOT  --   --   --   --  0.1*  --   --  0.1*  --   --   --   --   --   --   --   --   --  0.2*  --   PROT  --   --   --   --  6.0  --   --  5.9*  --   --   --   --   --   --   --   --   --  6.7  --   ALBUMIN  --   --   --   --  2.7*  --   --  2.7*  --   --   --   --   --   --   --   --   --  2.9*  --   APTT  --   --   --   --   --  33  --   --   --   --   --   --   --   --   --   --   --   --   --   INR  --   --   --   --   --  1.14  --   --   --   --   --   --   --   --   --   --   --   --   --   LATICACIDVEN  --  1.4  --   --   --   --   --   --   --   --   --   --   --   --   --   --   --   --   --   TROPONINI  --   --   --   --   --   --   --   --   --  <0.30 <0.30  --   --  <0.30  --   --   --   --   --   PHART 7.108*  --   < > 7.182*  --   --   < >  --   < >  --   --   < > 7.235*  --   --  7.222*  --   --  7.237*  PCO2ART 85.5*  --   < > 56.8*  --   --   < >  --   < >  --   --   < > 71.1*  --   --  65.9*  --   --  77.9*  PO2ART 87.0  --   < > 95.0  --   --   < >  --   < >  --   --   < > 81.0  --   --  66.0*  --   --  99.0  < > = values in this interval not displayed.  Recent Labs Lab 02/11/13 1223 02/11/13 1510 02/11/13 1922 02/11/13 2306 02/12/13 0341  GLUCAP 148* 114* 122* 102* 110*    CXR: cant eval LLL, no defined infiltrates, air left heart border?  ASSESSMENT / PLAN: Principal Problem:   Acute respiratory failure Active Problems:   Asthma exacerbation   Severe obesity (  BMI >= 40)   Influenza B   Status asthmaticus   PULMONARY A: Acute Respiratory Failure Status asthmaticus / Influenza B  pos Ventilator dyssynchrony Permissive hypercapnia Increased edema 4/19 CT chest 4/15 neg PE Discontinued bicarb 4/21 P:   ABGs looking better today with PH 7.237 Solumedral,reduce dose BDErs  q4h Adjust back up rate to 12   CARDIOVASCULAR A: Overload component P:  kvo Limit IV fluid    RENAL A: Acute renal failure. Creatinine stable at around 4.10 Volume excess, renal says ARF d/t contrast induced ATN P:   Give more lasix Renal following  Ordered  ANA, C3 /C4, Mpo/pr-3, GBM antibodies  GASTROINTESTINAL A:  Tolerated TF P:  H2 blocker TF, liver function  HEMATOLOGIC A:  DVt prevention P:  sub q hep  INFECTIOUS A:  Influenza B,SUper infection? P:   Cont Tamiflu for 7 days, dose adjust per pharm for renal clearance  4/20 >>Off  Vanc and ceftriaxone after  Maintain Levaquin   ENDOCRINE A:  No Acute Findings  P:  SSI on systemic steroids  NEUROLOGIC A:  Sedated, severe vent dyschrony, acidosis P:   Cont sedation with fentanyl and versed to RASS -3 goal   TODAY'S SUMMARY: 47 yo AAF with status asthmaticus d/t influenza B, ARF . Plan to reduce sedation, adjust vent further    Signed:  Jessee Avers, MD PGY-1 Internal Medicine Teaching Service Pager: [phone]   [facility] [id number]  Mariel [place]  [phone]  Cell  [phone]  If no response or cell goes to voicemail, call beeper [phone]  02/12/2013, 7:54 AM

## 2013-02-12 NOTE — Progress Notes (Signed)
Subjective: Interval History: none.  Objective: Vital signs in last 24 hours: Temp:  [97 F (36.1 C)-99.2 F (37.3 C)] 98.7 F (37.1 C) (04/22 0342) Pulse Rate:  [77-111] 98 (04/22 0700) Resp:  [7-22] 8 (04/22 0700) BP: (127-187)/(65-103) 151/81 mmHg (04/22 0600) SpO2:  [91 %-100 %] 97 % (04/22 0700) Arterial Line BP: (149-205)/(76-97) 149/77 mmHg (04/22 0700) FiO2 (%):  [30 %-40 %] 40 % (04/22 0700) Weight:  [139.1 kg (306 lb 10.6 oz)] 139.1 kg (306 lb 10.6 oz) (04/22 0455) Weight change: -0.2 kg (-7.1 oz)  Intake/Output from previous day: 04/21 0701 - 04/22 0700 In: 3033.6 [I.V.:1983.6; NG/GT:950; IV Piggyback:100] Out: 3035 [Urine:3035] Intake/Output this shift:    General appearance: nonresponsive Resp: diminished breath sounds bilaterally and rhonchi bilaterally Cardio: S1, S2 normal GI: obese,liver down 5 cm Extremities: edema 2+  Lab Results:  Recent Labs  02/11/13 0400 02/12/13 0500  WBC 10.5 14.3*  HGB 10.8* 10.9*  HCT 32.3* 32.9*  PLT 145* 164   BMET:  Recent Labs  02/11/13 2300 02/12/13 0500  NA 138 139  K 4.9 5.3*  CL 98 98  CO2 30 32  GLUCOSE 115* 122*  BUN 82* 90*  CREATININE 4.16* 4.10*  CALCIUM 7.4* 7.4*   No results found for this basename: PTH,  in the last 72 hours Iron Studies: No results found for this basename: IRON, TIBC, TRANSFERRIN, FERRITIN,  in the last 72 hours  Studies/Results: Dg Chest Port 1 View  02/12/2013  *RADIOLOGY REPORT*  Clinical Data: Ventilator  PORTABLE CHEST - 1 VIEW  Comparison: Chest radiograph 02/11/2013  Findings: Endotracheal tube, NG tube, and left central venous line in good position.  Normal cardiac silhouette.  Mild interstitial edema pattern.  No overt pulmonary edema.  No pleural fluid.  No pneumothorax.  IMPRESSION:  1.  Support apparatus in good position. 2.  Mild interstitial edema.   Original Report Authenticated By: Genevive Bi, M.D.    Dg Chest Port 1 View  02/11/2013  *RADIOLOGY REPORT*   Clinical Data: Shortness of breath  PORTABLE CHEST - 1 VIEW  Comparison: 02/10/2013  Findings: The cardiac shadow is stable.  The lungs are clear bilaterally.  An endotracheal tube, nasogastric catheter and central venous line are again seen and stable.  No focal infiltrate is seen.  No effusion is noted.  No bony abnormality is seen.  IMPRESSION: Tubes and lines as described.  No acute abnormality is noted.   Original Report Authenticated By: Alcide Clever, M.D.    Dg Chest Port 1 View  02/10/2013  *RADIOLOGY REPORT*  Clinical Data: Assess for mediastinal air.  PORTABLE CHEST - 1 VIEW  Comparison: 02/10/2013.  Findings: The support apparatus is stable.  The cardiac silhouette, mediastinal and hilar contours are unchanged.  The lungs are clear. No pleural effusion.  IMPRESSION:  1.  Stable support apparatus. 2.  No significant cardiopulmonary findings.   Original Report Authenticated By: Rudie Meyer, M.D.     I have reviewed the patient's current medications.  Assessment/Plan: 1 AKI vol xs, Cr plateau.  Most likely contrast.  Nonoliguric.  Alk so stop HC03.  Serologies P .  Still hematuria,most likely foley but follow and check serologies 2 Asthma per CCM 3 Obesity 4 Nutrition Tf 5  P Stop bicarb, f/u serolgies, limit fluid.    LOS: 7 days   Mark Hassey L 02/12/2013,7:45 AM

## 2013-02-12 NOTE — Progress Notes (Addendum)
Clinical Social Worker received correct phone numbers from [employer].  These numbers did not correlate to EPIC numbers.  CSW contact pt's spouse and confirmed correct numbers.  CSW updated contact information.  MIDAS documentation currently down, full assessment to follow.  CSW spoke with pt's spouse.  CSW introduced self, explained role, and provided support.  CSW provided active listening.  Spouse shared that he currently has no needs identified but shared that he and his wife have had difficulty with mold in their apartment.  Spouse reports he has reported this to his apartment complex.  CSW provided contact information to [employer] and encouraged spouse to contact [facility] for additional assistance with resolving the mold issue.  Spouse stated understanding. CSW updated RN with spouse's report of mold in the apartment.    Angelia Mould, MSW, [place] [phone]

## 2013-02-12 NOTE — Progress Notes (Signed)
NUTRITION FOLLOW UP  Intervention:    Given fluid overload and elevated phosphorus and potassium, will change TF formula to Nepro at 20 ml/h with Prostat 30 ml 5 times daily to provide 1364 kcals (25 kcals/kg ideal weight), 114 gm protein (95% minimum estimated needs), and 349 ml free water daily.  Nutrition Dx:   Inadequate oral intake related to respiratory failure as evidenced by NPO status. Ongoing.  Goal:   Enteral nutrition to provide 60-70% of estimated calorie needs (22-25 kcals/kg ideal body weight) and 100% of estimated protein needs, based on ASPEN guidelines for permissive underfeeding in critically ill obese individuals.  Monitor:   TF tolerance/adequacy, weight trend, labs, vent status.  Assessment:   Patient remains intubated. No longer receiving propofol for sedation. Tolerating TF well. Noted hyperphosphatemia and hyperkalemia. Nephrology following for AKI with volume excess. Plans to limit fluid.  Patient is currently intubated on ventilator support.  MV: 6.4 Temp:Temp (24hrs), Avg:98.4 F (36.9 C), Min:97.6 F (36.4 C), Max:99.2 F (37.3 C)   Height: Ht Readings from Last 1 Encounters:  02/07/13 5\' 4"  (1.626 m)    Weight Status:   Wt Readings from Last 1 Encounters:  02/12/13 306 lb 10.6 oz (139.1 kg)  02/08/13  275 lb 12.7 oz (125.1 kg)   Weight up with positive fluid status and 2+ edema in extremities.  Re-estimated needs:  Kcal: 2015  Protein: 120-130 gm Fluid: per MD   Skin: no problems noted  Diet Order: NPO  TF Order: Osmolite 1.2 at 40 ml/h via OG tube with Prostat 30 ml QID providing 1552 kcals, 113 gm protein, 787 ml free water daily.    Intake/Output Summary (Last 24 hours) at 02/12/13 1416 Last data filed at 02/12/13 1200  Gross per 24 hour  Intake   2718 ml  Output   2135 ml  Net    583 ml    Last BM: 4/21   Labs:   Recent Labs Lab 02/11/13 1500 02/11/13 2300 02/12/13 0500  NA 138 138 139  K 5.2* 4.9 5.3*  CL 97 98  98  CO2 30 30 32  BUN 73* 82* 90*  CREATININE 4.06* 4.16* 4.10*  CALCIUM 7.4* 7.4* 7.4*  PHOS  --   --  9.1*  GLUCOSE 124* 115* 122*    CBG (last 3)   Recent Labs  02/12/13 0341 02/12/13 0800 02/12/13 1138  GLUCAP 110* 120* 112*    Scheduled Meds: . antiseptic oral rinse  15 mL Mouth Rinse QID  . chlorhexidine  15 mL Mouth Rinse BID  . feeding supplement  30 mL Per Tube QID  . furosemide  40 mg Intravenous Q6H  . heparin subcutaneous  5,000 Units Subcutaneous Q8H  . insulin aspart  0-15 Units Subcutaneous Q4H  . ipratropium  0.5 mg Nebulization QID  . levalbuterol  1.25 mg Nebulization Q4H  . levofloxacin (LEVAQUIN) IV  750 mg Intravenous Q48H  . methylPREDNISolone (SOLU-MEDROL) injection  40 mg Intravenous Q12H  . oseltamivir  75 mg Per Tube Daily  . pantoprazole sodium  40 mg Per Tube Daily    Continuous Infusions: . feeding supplement (OSMOLITE 1.2 CAL) 1,000 mL (02/12/13 0023)  . fentaNYL infusion INTRAVENOUS 350 mcg/hr (02/12/13 0600)  . midazolam (VERSED) infusion 4 mg/hr (02/12/13 0150)    Joaquin Courts, RD, LDN, CNSC Pager [phone] After Hours Pager [phone]

## 2013-02-12 NOTE — Progress Notes (Signed)
CRITICAL VALUE ALERT  Critical value received:  Co2 on abg 77.9  Date of notification:  4/22  Time of notification:  0508  Critical value read back:yes(on istat)  Nurse who received alert:  Crist Fat RN  MD notified (1st page):  Dr. Kennerly([facility] resident)  Time of first page:  0515  MD notified (2nd page):NA  Time of second page:NA  Responding MD:  Garald Braver  Time MD responded:  [phone]

## 2013-02-12 NOTE — Progress Notes (Signed)
Clinical Social Worker left voicemail message with pt's spouse on number listed for mobile.  Home phone number remains busy.    Angelia Mould, MSW, [place] [phone]

## 2013-02-12 NOTE — Progress Notes (Signed)
Clinical Social work [employer] tried to contact pt's spouse through cell phone and home phone. CSW intern left message on spouses cell phone. The number for the house phone was busy. CSW to follow up as needed.  Morton Stall BSW intern

## 2013-02-12 NOTE — Progress Notes (Signed)
CRITICAL VALUE ALERT  Critical value received:  co2 on abg 65.9  Date of notification:  4/21  Time of notification:  1958  Critical value read back:yes(on istat)  Nurse who received alert:  Crist Fat RN  MD notified (1st page):  Dr. Tyson Alias in [place]  Time of first page:  2000  MD notified (2nd page):NA  Time of second page:NA  Responding MD:  feinstein  Time MD responded:  2000

## 2013-02-13 ENCOUNTER — Inpatient Hospital Stay (HOSPITAL_COMMUNITY): Payer: Managed Care, Other (non HMO)

## 2013-02-13 DIAGNOSIS — E46 Unspecified protein-calorie malnutrition: Secondary | ICD-10-CM | POA: Diagnosis present

## 2013-02-13 DIAGNOSIS — E440 Moderate protein-calorie malnutrition: Secondary | ICD-10-CM

## 2013-02-13 DIAGNOSIS — R748 Abnormal levels of other serum enzymes: Secondary | ICD-10-CM | POA: Diagnosis not present

## 2013-02-13 LAB — CBC
Hemoglobin: 9.8 g/dL — ABNORMAL LOW (ref 12.0–15.0)
MCH: 27.3 pg (ref 26.0–34.0)
RBC: 3.59 MIL/uL — ABNORMAL LOW (ref 3.87–5.11)
WBC: 9 10*3/uL (ref 4.0–10.5)

## 2013-02-13 LAB — MAGNESIUM: Magnesium: 3.3 mg/dL — ABNORMAL HIGH (ref 1.5–2.5)

## 2013-02-13 LAB — CULTURE, BLOOD (ROUTINE X 2): Culture: NO GROWTH

## 2013-02-13 LAB — POCT I-STAT 3, ART BLOOD GAS (G3+)
Acid-Base Excess: 7 mmol/L — ABNORMAL HIGH (ref 0.0–2.0)
Bicarbonate: 36.4 mEq/L — ABNORMAL HIGH (ref 20.0–24.0)
pH, Arterial: 7.29 — ABNORMAL LOW (ref 7.350–7.450)
pO2, Arterial: 134 mmHg — ABNORMAL HIGH (ref 80.0–100.0)

## 2013-02-13 LAB — RENAL FUNCTION PANEL
Albumin: 2.7 g/dL — ABNORMAL LOW (ref 3.5–5.2)
Chloride: 103 mEq/L (ref 96–112)
GFR calc Af Amer: 14 mL/min — ABNORMAL LOW (ref >90)
GFR calc non Af Amer: 12 mL/min — ABNORMAL LOW (ref >90)
Potassium: 4.9 mEq/L (ref 3.5–5.1)
Sodium: 143 mEq/L (ref 135–145)

## 2013-02-13 LAB — GLUCOSE, CAPILLARY
Glucose-Capillary: 110 mg/dL — ABNORMAL HIGH (ref 70–99)
Glucose-Capillary: 130 mg/dL — ABNORMAL HIGH (ref 70–99)

## 2013-02-13 LAB — IRON AND TIBC: Iron: 153 ug/dL — ABNORMAL HIGH (ref 42–135)

## 2013-02-13 MED ORDER — PREDNISONE 5 MG/ML PO CONC
40.0000 mg | Freq: Every day | ORAL | Status: DC
  Administered 2013-02-13 – 2013-02-16 (×4): 40 mg
  Filled 2013-02-13 (×5): qty 8

## 2013-02-13 MED ORDER — FENTANYL 50 MCG/HR TD PT72
75.0000 ug | MEDICATED_PATCH | TRANSDERMAL | Status: DC
  Administered 2013-02-13: 75 ug via TRANSDERMAL
  Filled 2013-02-13: qty 1

## 2013-02-13 MED ORDER — DEXTROSE 5 % IV SOLN
160.0000 mg | Freq: Four times a day (QID) | INTRAVENOUS | Status: AC
  Administered 2013-02-13 (×2): 160 mg via INTRAVENOUS
  Filled 2013-02-13 (×2): qty 16

## 2013-02-13 MED ORDER — CLONAZEPAM 0.1 MG/ML ORAL SUSPENSION
1.0000 mg | Freq: Three times a day (TID) | ORAL | Status: DC
  Filled 2013-02-13 (×3): qty 10

## 2013-02-13 MED ORDER — FENTANYL CITRATE 0.05 MG/ML IJ SOLN
25.0000 ug | INTRAMUSCULAR | Status: DC | PRN
  Administered 2013-02-15: 100 ug via INTRAVENOUS
  Filled 2013-02-13: qty 4
  Filled 2013-02-13: qty 2

## 2013-02-13 MED ORDER — HYDRALAZINE HCL 20 MG/ML IJ SOLN
20.0000 mg | Freq: Four times a day (QID) | INTRAMUSCULAR | Status: DC
  Administered 2013-02-13 – 2013-02-14 (×4): 20 mg via INTRAVENOUS
  Filled 2013-02-13 (×7): qty 1

## 2013-02-13 MED ORDER — CLONIDINE ORAL SUSPENSION 10 MCG/ML: 0.2000 mg | Freq: Three times a day (TID) | ORAL | Status: DC

## 2013-02-13 MED ORDER — CLONIDINE HCL 0.2 MG PO TABS
0.2000 mg | ORAL_TABLET | Freq: Three times a day (TID) | ORAL | Status: DC
  Administered 2013-02-13 (×2): 0.2 mg via ORAL
  Filled 2013-02-13 (×3): qty 1

## 2013-02-13 MED ORDER — HYDRALAZINE HCL 20 MG/ML IJ SOLN
5.0000 mg | INTRAMUSCULAR | Status: DC | PRN
  Filled 2013-02-13: qty 1

## 2013-02-13 MED ORDER — CLONAZEPAM 1 MG PO TABS
1.0000 mg | ORAL_TABLET | Freq: Three times a day (TID) | ORAL | Status: DC
  Administered 2013-02-13 – 2013-02-15 (×7): 1 mg
  Filled 2013-02-13 (×7): qty 1

## 2013-02-13 MED ORDER — CLONIDINE HCL 0.3 MG PO TABS
0.3000 mg | ORAL_TABLET | Freq: Three times a day (TID) | ORAL | Status: DC
  Administered 2013-02-13 – 2013-02-15 (×7): 0.3 mg via ORAL
  Filled 2013-02-13 (×11): qty 1

## 2013-02-13 MED ORDER — FENTANYL CITRATE 0.05 MG/ML IJ SOLN
200.0000 ug | Freq: Once | INTRAMUSCULAR | Status: AC
  Administered 2013-02-13: 200 ug via INTRAVENOUS
  Filled 2013-02-13: qty 4

## 2013-02-13 MED ORDER — HYDRALAZINE HCL 20 MG/ML IJ SOLN
INTRAMUSCULAR | Status: AC
  Administered 2013-02-13: 20 mg
  Filled 2013-02-13: qty 1

## 2013-02-13 NOTE — Progress Notes (Addendum)
PULMONARY  / CRITICAL CARE MEDICINE  Name: Kathleen Rodriguez MRN: [id number] DOB: Mar 22, 1966    ADMISSION DATE:  02/05/2013 CONSULTATION DATE:  02/07/2013  REFERRING MD :  [facility] PRIMARY SERVICE: [facility]  CHIEF COMPLAINT:  SOB  BRIEF PATIENT DESCRIPTION: 47 yo BF with hx of asthma, arthritis, and obesity admitted on 4/15 with SOB in significant respiratory distress. Patient had a negative CT pulmonary angiogram on 4/15. 4/16 patient came back positive with Influenza B and started on Tamiflu on 4/17. Patient intubated 4/17 in acute respiratory distress.  SIGNIFICANT EVENTS / STUDIES:  4/15-Chest CT negative 4/16-Influenza B positive 4/18 - Renal US- no hydro  LINES / TUBES: 4/17- ETT >>> 4/18 rt ij >>>  CULTURES: 4/17 trach aspirate >>NEG 4/17-BC >> No growth  4/17 UC >> Negative 4/16 Influenza B pos H1N1 , Influenza A - neg  ANTIBIOTICS: 4/15-Azithromycin>>>4/17 4/15 Cleocin>>>4/16 Tamiflu 4/17 >>  4/23 4/18 ceftriaxone>>> 4/20 4/18 levofloxacin>>>4/23 4/18 vanc>>>4/20  SUBJECTIVE:  Patient remains critically ill. No overnight events.  VITAL SIGNS: Temp:  [97.8 F (36.6 C)-98.6 F (37 C)] 97.8 F (36.6 C) (04/23 0400) Pulse Rate:  [86-105] 93 (04/23 0600) Resp:  [9-18] 11 (04/23 0600) BP: (136-156)/(62-89) 145/76 mmHg (04/23 0600) SpO2:  [97 %-100 %] 98 % (04/23 0600) Arterial Line BP: (141-162)/(68-83) 158/80 mmHg (04/23 0600) FiO2 (%):  [40 %] 40 % (04/23 0326) Weight:  [306 lb 10.6 oz (139.1 kg)] 306 lb 10.6 oz (139.1 kg) (04/23 0400) HEMODYNAMICS: CVP:  [23 mmHg-89 mmHg] 23 mmHg VENTILATOR SETTINGS: Vent Mode:  [-] PRVC FiO2 (%):  [40 %] 40 % Set Rate:  [12 bmp] 12 bmp Vt Set:  [550 mL] 550 mL PEEP:  [5 cmH20] 5 cmH20 Pressure Support:  [5 cmH20] 5 cmH20 Plateau Pressure:  [6 cmH20-24 cmH20] 6 cmH20 Note less airtrapping 4/23 Vee 0-4  INTAKE / OUTPUT: Intake/Output     04/22 0701 - 04/23 0700 04/23 0701 - 04/24 0700   I.V. (mL/kg) 1386 (10)    NG/GT  1915    IV Piggyback 150    Total Intake(mL/kg) 3451 (24.8)    Urine (mL/kg/hr) 2990 (0.9)    Total Output 2990     Net +461          Stool Occurrence 1 x      PHYSICAL EXAMINATION: General:  Obese Black female intubated and sedated, paralyzed. Neuro:  Sedated rass -3,  HEENT: increased JVD Cardiovascular:  s1 s 2 RRR no m, no crepitus, ++edema Lungs:  Distant diffuse Abdomen:  Abdomen feels tense but not distended. Normal bowel sounds, no r/g Musculoskeletal:  No obvious deformities.  Skin:  Warm to the touch. No rashes  LABS:  Recent Labs Lab 02/08/13 0631 02/08/13 0842  02/09/13 0431 02/09/13 0435 02/09/13 0800  02/10/13 0450  02/10/13 2146 02/11/13 0400  02/11/13 1055 02/11/13 1346  02/11/13 1958 02/11/13 2300 02/12/13 0500 02/12/13 0508 02/13/13 0425  HGB  --   --   --   --  10.6*  --   --  10.4*  --   --  10.8*  --   --   --   --   --   --  10.9*  --  9.8*  WBC  --   --   --   --  9.2  --   --  9.8  --   --  10.5  --   --   --   --   --   --  14.3*  --  9.0  PLT  --   --   --   --  178  --   --  179  --   --  145*  --   --   --   --   --   --  164  --  153  NA  --   --   < >  --  134*  --   < > 134*  < > 129* 136  --   --   --   < >  --  138 139  --  143  K  --   --   < >  --  3.8  --   < > 4.8  < > 5.2* 4.9  --   --   --   < >  --  4.9 5.3*  --  4.9  CL  --   --   < >  --  102  --   < > 102  < > 95* 98  --   --   --   < >  --  98 98  --  103  CO2  --   --   < >  --  21  --   < > 20  < > 23 26  --   --   --   < >  --  30 32  --  33*  GLUCOSE  --   --   < >  --  173*  --   < > 125*  < > 150* 133*  --   --   --   < >  --  115* 122*  --  121*  BUN  --   --   < >  --  33*  --   < > 50*  < > 66* 67*  --   --   --   < >  --  82* 90*  --  105*  CREATININE  --   --   < >  --  2.96*  --   < > 4.01*  < > 4.29* 4.22*  --   --   --   < >  --  4.16* 4.10*  --  4.09*  CALCIUM  --   --   < >  --  7.5*  --   < > 7.5*  < > 7.2* 7.1*  --   --   --   < >  --  7.4* 7.4*  --  7.9*   MG  --   --   --   --   --   --   --   --   --   --   --   --   --   --   --   --   --   --   --  3.3*  PHOS  --   --   --   --   --   --   --   --   --   --   --   --   --   --   --   --   --  9.1*  --  8.6*  AST  --   --   --   --  28  --   --  29  --   --   --   --   --   --   --   --   --  72*  --   --   ALT  --   --   --   --  25  --   --  25  --   --   --   --   --   --   --   --   --  63*  --   --   ALKPHOS  --   --   --   --  35*  --   --  34*  --   --   --   --   --   --   --   --   --  38*  --   --   BILITOT  --   --   --   --  0.1*  --   --  0.1*  --   --   --   --   --   --   --   --   --  0.2*  --   --   PROT  --   --   --   --  6.0  --   --  5.9*  --   --   --   --   --   --   --   --   --  6.7  --   --   ALBUMIN  --   --   --   --  2.7*  --   --  2.7*  --   --   --   --   --   --   --   --   --  2.9*  --  2.7*  APTT  --   --   --   --   --  33  --   --   --   --   --   --   --   --   --   --   --   --   --   --   INR  --   --   --   --   --  1.14  --   --   --   --   --   --   --   --   --   --   --   --   --   --   LATICACIDVEN  --  1.4  --   --   --   --   --   --   --   --   --   --   --   --   --   --   --   --   --   --   TROPONINI  --   --   --   --   --   --   --   --   --  <0.30 <0.30  --   --  <0.30  --   --   --   --   --   --   PHART 7.108*  --   < > 7.182*  --   --   < >  --   < >  --   --   < > 7.235*  --   --  7.222*  --   --  7.237*  --   PCO2ART 85.5*  --   < > 56.8*  --   --   < >  --   < >  --   --   < >  71.1*  --   --  65.9*  --   --  77.9*  --   PO2ART 87.0  --   < > 95.0  --   --   < >  --   < >  --   --   < > 81.0  --   --  66.0*  --   --  99.0  --   < > = values in this interval not displayed.  Recent Labs Lab 02/12/13 1534 02/12/13 2002 02/12/13 2327 02/13/13 0402 02/13/13 0711  GLUCAP 107* 109* 119* 117* 110*    CXR: mild interstitial edema    ASSESSMENT / PLAN: Principal Problem:   Acute respiratory failure Active Problems:   Asthma  exacerbation   Severe obesity (BMI >= 40)   Influenza B   Status asthmaticus   PULMONARY A: Acute Respiratory Failure Status asthmaticus / Influenza B pos Ventilator dyssynchrony, improved  Permissive hypercapnia Increased edema 4/19 CT chest 4/15 neg PE Discontinued bicarb 4/21 P:   Taper steroidsBDErs  q4h Rate at 12   CARDIOVASCULAR A: Overload component. Appear clinically euvolemic. Hemodynamically stable. HTNive P:  kvo IV lasix 160 mg  q12hr x 2 as per renal Start clonidene 0.2 tid and hydralazine IV   RENAL A: Acute renal failure. Creatinine stable at around 4.0. Other electrolytes stable.  Volume excess, renal says ARF d/t contrast induced ATN P:   IV lasix 160 mg  q12hr x 2 as per renal Renal following and appreciated , may need CRRT/HD ANA, C3 /C4, Mpo/pr-3, GBM antibodies >>> negative  GASTROINTESTINAL A:  Tolerated TF, Mild elevation in liver enzymes. Moderate protein malnutrition. P:  H2 blocker Cont TF  HEMATOLOGIC A:  DVt prevention P:  sub q hep  INFECTIOUS A:  Influenza B,SUper infection? P:   Tamiflu - stop date 4/23 4/20 >>Off  Vanc and ceftriaxone after  D/c levaquin 4/23  ENDOCRINE A:  No Acute Findings  P:  SSI on systemic steroids  NEUROLOGIC A:  Sedated, improved vent synchrony P:   Try to modify RASS to 0 to -1 with use of fent patch and klonopin per tube and get off drips.   TODAY'S SUMMARY: 47 yo AAF with status asthmaticus d/t influenza B, ARF . Plan to reduce sedation, adjust vent further.  Try to rx HTN , cont diuresis, may need CRRT or HD  CC 54min  Signed:  Jessee Avers, MD PGY-1 Internal Medicine Teaching Service Pager: [phone]  Mariel Sleet Beeper  [phone]  Cell  [phone]  If no response or cell goes to voicemail, call beeper [phone]

## 2013-02-13 NOTE — Progress Notes (Signed)
Subjective: Interval History: none.  Objective: Vital signs in last 24 hours: Temp:  [97.8 F (36.6 C)-98.6 F (37 C)] 97.8 F (36.6 C) (04/23 0400) Pulse Rate:  [86-105] 93 (04/23 0600) Resp:  [9-18] 11 (04/23 0600) BP: (136-156)/(62-89) 145/76 mmHg (04/23 0600) SpO2:  [97 %-100 %] 98 % (04/23 0600) Arterial Line BP: (141-162)/(68-83) 158/80 mmHg (04/23 0600) FiO2 (%):  [40 %] 40 % (04/23 0326) Weight:  [139.1 kg (306 lb 10.6 oz)] 139.1 kg (306 lb 10.6 oz) (04/23 0400) Weight change: 0 kg (0 lb)  Intake/Output from previous day: 04/22 0701 - 04/23 0700 In: 3451 [I.V.:1386; NG/GT:1915; IV Piggyback:150] Out: 2990 [Urine:2990] Intake/Output this shift:    General appearance: sedated on vent Resp: diminished breath sounds bilaterally and rhonchi bilaterally Cardio: S1, S2 normal and systolic murmur: holosystolic 2/6, blowing at lower left sternal border GI: obese, pos bs, liver down 5 cm Extremities: edema 3+  Lab Results:  Recent Labs  02/12/13 0500 02/13/13 0425  WBC 14.3* 9.0  HGB 10.9* 9.8*  HCT 32.9* 30.2*  PLT 164 153   BMET:  Recent Labs  02/12/13 0500 02/13/13 0425  NA 139 143  K 5.3* 4.9  CL 98 103  CO2 32 33*  GLUCOSE 122* 121*  BUN 90* 105*  CREATININE 4.10* 4.09*  CALCIUM 7.4* 7.9*   No results found for this basename: PTH,  in the last 72 hours Iron Studies: No results found for this basename: IRON, TIBC, TRANSFERRIN, FERRITIN,  in the last 72 hours  Studies/Results: Dg Chest Port 1 View  02/12/2013  *RADIOLOGY REPORT*  Clinical Data: Ventilator  PORTABLE CHEST - 1 VIEW  Comparison: Chest radiograph 02/11/2013  Findings: Endotracheal tube, NG tube, and left central venous line in good position.  Normal cardiac silhouette.  Mild interstitial edema pattern.  No overt pulmonary edema.  No pleural fluid.  No pneumothorax.  IMPRESSION:  1.  Support apparatus in good position. 2.  Mild interstitial edema.   Original Report Authenticated By: Genevive Bi, M.D.     I have reviewed the patient's current medications.  Assessment/Plan: 1 AKI contrast.  Vol xs, K& acid/base ok.  Will give diruretics, caution with HCO3.  ^^CVP 2 Asthma with resp failure per CCM 3 Obesity 4 Nutrition TF 5 Anemia  P Lasix, follow bicarb     LOS: 8 days   Josian Lanese L 02/13/2013,7:31 AM

## 2013-02-13 NOTE — Progress Notes (Signed)
ANTIBIOTIC CONSULT NOTE - FOLLOW UP  Pharmacy Consult for Levofloxacin, Tamiflu Indication: rule out pneumonia, influenza positive  Allergies  Allergen Reactions  . Other Hives and Itching    Ketchup  . Tomato Hives and Itching    Patient Measurements: Height: 5\' 4"  (162.6 cm) Weight: 306 lb 10.6 oz (139.1 kg) IBW/kg (Calculated) : 54.7  Vital Signs: Temp: 97.7 F (36.5 C) (04/23 0800) Temp src: Oral (04/23 0800) BP: 139/73 mmHg (04/23 0818) Pulse Rate: 103 (04/23 0818) Intake/Output from previous day: 04/22 0701 - 04/23 0700 In: 3451 [I.V.:1386; NG/GT:1915; IV Piggyback:150] Out: 2990 [Urine:2990] Intake/Output from this shift:    Labs:  Recent Labs  02/11/13 0400  02/11/13 2300 02/12/13 0500 02/13/13 0425  WBC 10.5  --   --  14.3* 9.0  HGB 10.8*  --   --  10.9* 9.8*  PLT 145*  --   --  164 153  CREATININE 4.22*  < > 4.16* 4.10* 4.09*  < > = values in this interval not displayed. Estimated Creatinine Clearance: 23.8 ml/min (by C-G formula based on Cr of 4.09). No results found for this basename: Rolm Gala, VANCORANDOM, GENTTROUGH, GENTPEAK, GENTRANDOM, TOBRATROUGH, TOBRAPEAK, TOBRARND, AMIKACINPEAK, AMIKACINTROU, AMIKACIN,  in the last 72 hours   Assessment 47 yo F with asthma and SOB, intubated 4/17 and noted to be influenza positive. Pharmacy consulted to dose vanc, levofloxacin and adjust tamiflu (influenza+).WBC= 9.0, afeb and SCr= 4.09 with CrCl ~25.  4/17 Tamiflu 4/18 Levofloxacin 4/18 Vancomycin >> 4/20 4/17 Ceftriaxone >> 4/20  4/21 urine- neg 4/21 c. Diff.- neg 4/17 blood x2- neg  Plan -Noted plans to stop Tamiflu today -No levaquin changed needed -Consider defining a stop date for levaquin  Thank you, Harland German, Pharm D 02/13/2013 8:31 AM

## 2013-02-14 ENCOUNTER — Inpatient Hospital Stay (HOSPITAL_COMMUNITY): Payer: Managed Care, Other (non HMO)

## 2013-02-14 DIAGNOSIS — I517 Cardiomegaly: Secondary | ICD-10-CM

## 2013-02-14 LAB — COMPREHENSIVE METABOLIC PANEL
ALT: 58 U/L — ABNORMAL HIGH (ref 0–35)
AST: 52 U/L — ABNORMAL HIGH (ref 0–37)
Calcium: 8.7 mg/dL (ref 8.4–10.5)
Sodium: 148 mEq/L — ABNORMAL HIGH (ref 135–145)
Total Protein: 6.2 g/dL (ref 6.0–8.3)

## 2013-02-14 LAB — PHOSPHORUS: Phosphorus: 5.2 mg/dL — ABNORMAL HIGH (ref 2.3–4.6)

## 2013-02-14 LAB — CBC
MCHC: 33.6 g/dL (ref 30.0–36.0)
Platelets: 146 10*3/uL — ABNORMAL LOW (ref 150–400)
RDW: 15.6 % — ABNORMAL HIGH (ref 11.5–15.5)

## 2013-02-14 LAB — GLUCOSE, CAPILLARY
Glucose-Capillary: 120 mg/dL — ABNORMAL HIGH (ref 70–99)
Glucose-Capillary: 104 mg/dL — ABNORMAL HIGH (ref 70–99)
Glucose-Capillary: 123 mg/dL — ABNORMAL HIGH (ref 70–99)

## 2013-02-14 MED ORDER — MIDAZOLAM HCL 2 MG/2ML IJ SOLN
2.0000 mg | Freq: Once | INTRAMUSCULAR | Status: AC
  Administered 2013-02-14: 4 mg via INTRAVENOUS
  Filled 2013-02-14: qty 4

## 2013-02-14 MED ORDER — BISOPROLOL FUMARATE 5 MG PO TABS: 5.0000 mg | ORAL_TABLET | Freq: Every day | ORAL | Status: DC

## 2013-02-14 MED ORDER — POTASSIUM CHLORIDE 20 MEQ/15ML (10%) PO LIQD
ORAL | Status: AC
  Filled 2013-02-14: qty 30

## 2013-02-14 MED ORDER — FREE WATER
200.0000 mL | Freq: Four times a day (QID) | Status: DC
  Administered 2013-02-14 – 2013-02-15 (×4): 200 mL

## 2013-02-14 MED ORDER — DEXTROSE 5 % IV SOLN
160.0000 mg | Freq: Four times a day (QID) | INTRAVENOUS | Status: AC
  Administered 2013-02-14 (×2): 160 mg via INTRAVENOUS
  Filled 2013-02-14 (×2): qty 16

## 2013-02-14 MED ORDER — POTASSIUM CHLORIDE 20 MEQ/15ML (10%) PO LIQD
40.0000 meq | Freq: Every day | ORAL | Status: AC
  Administered 2013-02-14: 40 meq
  Filled 2013-02-14: qty 30

## 2013-02-14 MED ORDER — SODIUM CHLORIDE 0.9 % IV SOLN
INTRAVENOUS | Status: DC
  Administered 2013-02-14: 1000 mL via INTRAVENOUS
  Administered 2013-02-15: 18:00:00 via INTRAVENOUS

## 2013-02-14 MED ORDER — DILTIAZEM 12 MG/ML ORAL SUSPENSION
60.0000 mg | Freq: Four times a day (QID) | ORAL | Status: DC
  Administered 2013-02-14 – 2013-02-17 (×13): 60 mg
  Filled 2013-02-14 (×16): qty 6

## 2013-02-14 NOTE — Progress Notes (Signed)
Echocardiogram 2D Echocardiogram has been performed.  Kathleen Rodriguez 02/14/2013, 3:33 PM

## 2013-02-14 NOTE — Progress Notes (Signed)
Elink consulted about ventilator autopeep issue, with peep baseline as high as 15.  Patient placed on CPAP/PS.  HME also changed.  Peep returned to 5cm.  Will monitor for reoccurrences.

## 2013-02-14 NOTE — Progress Notes (Signed)
Subjective: Interval History: none.  Objective: Vital signs in last 24 hours: Temp:  [97.6 F (36.4 C)-99.5 F (37.5 C)] 99.5 F (37.5 C) (04/24 0000) Pulse Rate:  [85-146] 129 (04/24 0747) Resp:  [12-35] 19 (04/24 0747) BP: (123-199)/(64-139) 178/91 mmHg (04/24 0747) SpO2:  [97 %-100 %] 100 % (04/24 0747) Arterial Line BP: (131-196)/(65-102) 160/79 mmHg (04/24 0600) FiO2 (%):  [40 %] 40 % (04/24 0747) Weight:  [133.7 kg (294 lb 12.1 oz)] 133.7 kg (294 lb 12.1 oz) (04/24 0436) Weight change: -5.4 kg (-11 lb 14.5 oz)  Intake/Output from previous day: 04/23 0701 - 04/24 0700 In: 1714 [I.V.:192; NG/GT:1390; IV Piggyback:132] Out: 6370 [Urine:5870; Stool:500] Intake/Output this shift:    General appearance: on vent, sedated Resp: diminished breath sounds bilaterally Cardio: S1, S2 normal and systolic murmur: holosystolic 2/6, blowing at lower left sternal border GI: abnormal findings:  obese and live down 6 cm Extremities: edema 3+  Lab Results:  Recent Labs  02/13/13 0425 02/14/13 0420  WBC 9.0 10.6*  HGB 9.8* 10.1*  HCT 30.2* 30.1*  PLT 153 146*   BMET:  Recent Labs  02/13/13 0425 02/14/13 0420  NA 143 148*  K 4.9 3.5  CL 103 103  CO2 33* 38*  GLUCOSE 121* 121*  BUN 105* 116*  CREATININE 4.09* 3.69*  CALCIUM 7.9* 8.7   No results found for this basename: PTH,  in the last 72 hours Iron Studies:  Recent Labs  02/13/13 0755  IRON 153*  TIBC 295    Studies/Results: Dg Chest Port 1 View  02/13/2013  *RADIOLOGY REPORT*  Clinical Data: Short of breath.  Ventilated patient.  PORTABLE CHEST - 1 VIEW  Comparison: 02/12/2013.  Findings: The patient is rotated to the left.  Endotracheal tube, enteric tube and right IJ central line appears similar to prior. Monitoring leads remain present.  Lung volumes are low with basilar atelectasis.  Pulmonary vascular congestion and mild interstitial edema appears similar.  IMPRESSION:  1.  Stable support apparatus. 2.   Increasing basilar atelectasis with persistent interstitial pulmonary edema.   Original Report Authenticated By: Andreas Newport, M.D.     I have reviewed the patient's current medications.  Assessment/Plan: 1 AKI diuresing, vol better, will cont to diurese today, but caution with bicarb.  Give Kcl. Cr mildly better 2 Anemia Fe ok, follow 3 Obesity 4 Htn on meds and will come down with lower vol 5 Asthma/VDRF per CCM 6 ^ LFTs P Lasix, kcl, control bp,     LOS: 9 days   Kathleen Rodriguez L 02/14/2013,7:49 AM

## 2013-02-14 NOTE — Progress Notes (Deleted)
CRITICAL VALUE ALERT  Critical value received:  Hgb 6.9  Date of notification:  02/14/2013  Time of notification:  0650  Critical value read back:yes  Nurse who received alert:  Marta Antu  MD notified (1st page):  Dr HO  Time of first page:  0650  MD notified (2nd page):  Time of second page:  Responding MD:  Dr Anselm Jungling  Time MD responded:  [phone]

## 2013-02-14 NOTE — Progress Notes (Addendum)
PULMONARY  / CRITICAL CARE MEDICINE  Name: Kathleen Rodriguez MRN: [id number] DOB: 1966-03-27    ADMISSION DATE:  02/05/2013 CONSULTATION DATE:  02/07/2013  REFERRING MD :  [facility] PRIMARY SERVICE: [facility]  CHIEF COMPLAINT:  SOB  BRIEF PATIENT DESCRIPTION: 47 yo BF with hx of asthma, arthritis, and obesity admitted on 4/15 with SOB in significant respiratory distress. Patient had a negative CT pulmonary angiogram on 4/15. 4/16 patient came back positive with Influenza B and started on Tamiflu on 4/17. Patient intubated 4/17 in acute respiratory distress.  SIGNIFICANT EVENTS / STUDIES:  4/15-Chest CT negative 4/16-Influenza B positive 4/18 - Renal US- no hydro  LINES / TUBES: 4/17- ETT >>> 4/18 rt ij >>>  CULTURES: 4/17 trach aspirate >>NEG 4/17-BC >> No growth  4/17 UC >> Negative 4/16 Influenza B pos H1N1 , Influenza A - neg  ANTIBIOTICS: 4/15-Azithromycin>>>4/17 4/15 Cleocin>>>4/16 Tamiflu 4/17 >>  4/23 4/18 ceftriaxone>>> 4/20 4/18 levofloxacin>>>4/23 4/18 vanc>>>4/20  SUBJECTIVE:  Patient remains critically ill. No overnight events.  VITAL SIGNS: Temp:  [97.6 F (36.4 C)-99.5 F (37.5 C)] 99.5 F (37.5 C) (04/24 0000) Pulse Rate:  [87-146] 129 (04/24 0747) Resp:  [12-20] 19 (04/24 0747) BP: (123-199)/(64-139) 178/91 mmHg (04/24 0747) SpO2:  [97 %-100 %] 100 % (04/24 0747) Arterial Line BP: (131-196)/(65-102) 180/91 mmHg (04/24 0747) FiO2 (%):  [40 %] 40 % (04/24 0747) Weight:  [294 lb 12.1 oz (133.7 kg)] 294 lb 12.1 oz (133.7 kg) (04/24 0436) HEMODYNAMICS: CVP:  [16 mmHg-79 mmHg] 16 mmHg VENTILATOR SETTINGS: Vent Mode:  [-] PRVC FiO2 (%):  [40 %] 40 % Set Rate:  [12 bmp] 12 bmp Vt Set:  [550 mL] 550 mL PEEP:  [5 cmH20] 5 cmH20 Pressure Support:  [14 cmH20] 14 cmH20 Plateau Pressure:  [19 cmH20-22 cmH20] 22 cmH20 Note less airtrapping 4/23 Vee 0-4  INTAKE / OUTPUT: Intake/Output     04/23 0701 - 04/24 0700 04/24 0701 - 04/25 0700   I.V. (mL/kg) 192 (1.4) 10  (0.1)   NG/GT 1430 110   IV Piggyback 132    Total Intake(mL/kg) 1754 (13.1) 120 (0.9)   Urine (mL/kg/hr) 5870 (1.8) 200 (0.5)   Stool 500 (0.2)    Total Output 6370 200   Net -4616 -80          PHYSICAL EXAMINATION: General:  Obese Black female intubated and sedated, paralyzed. Neuro:  Sedated rass -3,  HEENT: increased JVD Cardiovascular:  s1 s 2 RRR no m, no crepitus, ++edema Lungs:  Distant diffuse Abdomen:  Abdomen feels tense but not distended. Normal bowel sounds, no r/g Musculoskeletal:  No obvious deformities. A line still in and appears swollen. Skin:  Warm to the touch. No rashes  LABS:  Recent Labs Lab 02/08/13 0631 02/08/13 0842  02/09/13 0431  02/09/13 0800  02/10/13 0450  02/10/13 2146 02/11/13 0400  02/11/13 1346  02/11/13 1958  02/12/13 0500 02/12/13 0508 02/13/13 0425 02/13/13 0823 02/14/13 0420  HGB  --   --   --   --   < >  --   --  10.4*  --   --  10.8*  --   --   --   --   --  10.9*  --  9.8*  --  10.1*  WBC  --   --   --   --   < >  --   --  9.8  --   --  10.5  --   --   --   --   --  14.3*  --  9.0  --  10.6*  PLT  --   --   --   --   < >  --   --  179  --   --  145*  --   --   --   --   --  164  --  153  --  146*  NA  --   --   < >  --   < >  --   < > 134*  < > 129* 136  --   --   < >  --   < > 139  --  143  --  148*  K  --   --   < >  --   < >  --   < > 4.8  < > 5.2* 4.9  --   --   < >  --   < > 5.3*  --  4.9  --  3.5  CL  --   --   < >  --   < >  --   < > 102  < > 95* 98  --   --   < >  --   < > 98  --  103  --  103  CO2  --   --   < >  --   < >  --   < > 20  < > 23 26  --   --   < >  --   < > 32  --  33*  --  38*  GLUCOSE  --   --   < >  --   < >  --   < > 125*  < > 150* 133*  --   --   < >  --   < > 122*  --  121*  --  121*  BUN  --   --   < >  --   < >  --   < > 50*  < > 66* 67*  --   --   < >  --   < > 90*  --  105*  --  116*  CREATININE  --   --   < >  --   < >  --   < > 4.01*  < > 4.29* 4.22*  --   --   < >  --   < > 4.10*  --  4.09*   --  3.69*  CALCIUM  --   --   < >  --   < >  --   < > 7.5*  < > 7.2* 7.1*  --   --   < >  --   < > 7.4*  --  7.9*  --  8.7  MG  --   --   --   --   --   --   --   --   --   --   --   --   --   --   --   --   --   --  3.3*  --   --   PHOS  --   --   --   --   --   --   --   --   --   --   --   --   --   --   --   --  9.1*  --  8.6*  --  5.2*  AST  --   --   --   --   < >  --   --  29  --   --   --   --   --   --   --   --  72*  --   --   --  52*  ALT  --   --   --   --   < >  --   --  25  --   --   --   --   --   --   --   --  63*  --   --   --  58*  ALKPHOS  --   --   --   --   < >  --   --  34*  --   --   --   --   --   --   --   --  38*  --   --   --  32*  BILITOT  --   --   --   --   < >  --   --  0.1*  --   --   --   --   --   --   --   --  0.2*  --   --   --  0.2*  PROT  --   --   --   --   < >  --   --  5.9*  --   --   --   --   --   --   --   --  6.7  --   --   --  6.2  ALBUMIN  --   --   --   --   < >  --   --  2.7*  --   --   --   --   --   --   --   --  2.9*  --  2.7*  --  2.7*  APTT  --   --   --   --   --  33  --   --   --   --   --   --   --   --   --   --   --   --   --   --   --   INR  --   --   --   --   --  1.14  --   --   --   --   --   --   --   --   --   --   --   --   --   --   --   LATICACIDVEN  --  1.4  --   --   --   --   --   --   --   --   --   --   --   --   --   --   --   --   --   --   --   TROPONINI  --   --   --   --   --   --   --   --   --  <0.30 <0.30  --  <0.30  --   --   --   --   --   --   --   --  PHART 7.108*  --   < > 7.182*  --   --   < >  --   < >  --   --   < >  --   --  7.222*  --   --  7.237*  --  7.290*  --   PCO2ART 85.5*  --   < > 56.8*  --   --   < >  --   < >  --   --   < >  --   --  65.9*  --   --  77.9*  --  75.2*  --   PO2ART 87.0  --   < > 95.0  --   --   < >  --   < >  --   --   < >  --   --  66.0*  --   --  99.0  --  134.0*  --   < > = values in this interval not displayed.  Recent Labs Lab 02/13/13 1507 02/13/13 2049 02/14/13 0017  02/14/13 0432 02/14/13 0754  GLUCAP 130* 130* 118* 120* 104*    CXR: 4/24  mild interstitial edema vs atypical. Repeat pcxr stable.   ASSESSMENT / PLAN: Principal Problem:   Acute respiratory failure Active Problems:   Asthma exacerbation   Severe obesity (BMI >= 40)   Influenza B   Status asthmaticus   Moderate protein-energy malnutrition   Elevated liver enzymes   PULMONARY A: Acute Respiratory Failure Status asthmaticus / Influenza B pos Ventilator dyssynchrony, improved  Permissive hypercapnia Increased edema 4/19 CT chest 4/15 neg PE Discontinued bicarb 4/21 P:   Taper steroidsBDErs  q4h Rate at 44 Consulted ENT for a trach - anticipate difficult due morbid obesity   CARDIOVASCULAR A: Overload component. Hemodynamically stable. Net/in + 17L. Hypertension  P:  kvo IV lasix 160 mg  q12hr x 2 as per renal Po Clonidine increased  0.3 mg tid Hydralazine IV 5-25 mg q4hr  Start Cardizem po 60 mg q6hr   Echocardiogram for EF   RENAL A: Acute renal failure. Creatinine stable at around 4.0>>3.69. Other electrolytes stable.  Volume excess, renal says ARF d/t contrast induced ATN. Mild hypernatremia. P:   IV lasix 160 mg  q12hr x 2 as per renal Renal following and appreciated , may need CRRT/HD ANA, C3 /C4, Mpo/pr-3, GBM antibodies >>> negative Start free water per tube 200 ml q6hrs. Will continue to aim for negative balance  GASTROINTESTINAL A:  Tolerated TF, Mild elevation in liver enzymes. Moderate protein malnutrition. Diarrhea C.diff - negative. P:  H2 blocker Cont TF  HEMATOLOGIC A:  DVt prevention P:  sub q hep  INFECTIOUS A:  Influenza B,SUper infection?  Off antibiotics 4/23.  Off Tamiflu 4/23 P:   Monitor for fever and leukocytosis Trend CBC  ENDOCRINE A:  No Acute Findings  P:  SSI on systemic steroids  NEUROLOGIC A:  Sedated, improved vent synchrony. Off IV sedation drips 4/23.  P:   Try to modify RASS to 0 to -1 with use of fent  patch and klonopin per tube and get off drips.   TODAY'S SUMMARY: 47 yo AAF with status asthmaticus d/t influenza B, ARF . Plan to reduce sedation, adjust vent further.  Try to rx HTN , cont diuresis, may need CRRT or HD. Consulted ENT for Trach.  CC 73min    Signed:  Jessee Avers, MD PGY-1 Internal Medicine Teaching Service Pager: [phone]  Asencion Noble  Beeper  [phone]  Cell  [phone]  If no response or cell goes to voicemail, call beeper [phone]  02/14/2013 10:11 AM

## 2013-02-15 ENCOUNTER — Inpatient Hospital Stay (HOSPITAL_COMMUNITY): Payer: Managed Care, Other (non HMO)

## 2013-02-15 LAB — CBC
HCT: 32.2 % — ABNORMAL LOW (ref 36.0–46.0)
MCV: 83.6 fL (ref 78.0–100.0)
Platelets: 153 10*3/uL (ref 150–400)
RBC: 3.85 MIL/uL — ABNORMAL LOW (ref 3.87–5.11)
WBC: 10.2 10*3/uL (ref 4.0–10.5)

## 2013-02-15 LAB — GLUCOSE, CAPILLARY
Glucose-Capillary: 108 mg/dL — ABNORMAL HIGH (ref 70–99)
Glucose-Capillary: 117 mg/dL — ABNORMAL HIGH (ref 70–99)
Glucose-Capillary: 148 mg/dL — ABNORMAL HIGH (ref 70–99)

## 2013-02-15 LAB — RENAL FUNCTION PANEL
CO2: 43 mEq/L (ref 19–32)
Chloride: 105 mEq/L (ref 96–112)
Creatinine, Ser: 3.33 mg/dL — ABNORMAL HIGH (ref 0.50–1.10)
GFR calc non Af Amer: 15 mL/min — ABNORMAL LOW (ref >90)

## 2013-02-15 MED ORDER — POTASSIUM CHLORIDE 20 MEQ/15ML (10%) PO LIQD
ORAL | Status: AC
  Administered 2013-02-15: 40 meq via ORAL
  Filled 2013-02-15: qty 30

## 2013-02-15 MED ORDER — NEPRO/CARBSTEADY PO LIQD
1000.0000 mL | ORAL | Status: DC
  Filled 2013-02-15 (×2): qty 1000

## 2013-02-15 MED ORDER — FREE WATER
250.0000 mL | Status: DC
  Administered 2013-02-15 – 2013-02-16 (×6): 250 mL

## 2013-02-15 MED ORDER — FENTANYL CITRATE 0.05 MG/ML IJ SOLN: 100.0000 ug | INTRAMUSCULAR | Status: DC | PRN

## 2013-02-15 MED ORDER — CLONAZEPAM 1 MG PO TABS: 1.0000 mg | ORAL_TABLET | Freq: Three times a day (TID) | ORAL | Status: DC | PRN

## 2013-02-15 MED ORDER — NEPRO/CARBSTEADY PO LIQD
1000.0000 mL | ORAL | Status: DC
  Administered 2013-02-15 – 2013-02-16 (×2): 1000 mL
  Filled 2013-02-15 (×4): qty 1000

## 2013-02-15 MED ORDER — POTASSIUM CHLORIDE 20 MEQ/15ML (10%) PO LIQD
40.0000 meq | ORAL | Status: AC
  Administered 2013-02-15 (×2): 40 meq via ORAL
  Filled 2013-02-15 (×2): qty 30

## 2013-02-15 NOTE — Progress Notes (Addendum)
PULMONARY  / CRITICAL CARE MEDICINE  Name: Kathleen Rodriguez MRN: [id number] DOB: December 22, 1965    ADMISSION DATE:  02/05/2013 CONSULTATION DATE:  02/07/2013  REFERRING MD :  [facility] PRIMARY SERVICE: [facility]  CHIEF COMPLAINT:  SOB  BRIEF PATIENT DESCRIPTION: 47 yo BF with hx of asthma, arthritis, and obesity admitted on 4/15 with SOB in significant respiratory distress. Patient had a negative CT pulmonary angiogram on 4/15. 4/16 patient came back positive with Influenza B and started on Tamiflu on 4/17. Patient intubated 4/17 in acute respiratory distress.  SIGNIFICANT EVENTS / STUDIES:  4/15-Chest CT negative 4/16-Influenza B positive 4/18 - Renal US- no hydro 4/24 - Echo EF 65-70%, mild LVH, grade 1 DD  LINES / TUBES: 4/17- ETT >>> 4/18 rt ij >>> 4/18 A-line >>4/24  CULTURES: 4/17 trach aspirate >>NEG 4/17-BC >> No growth  4/17 UC >> Negative 4/16 Influenza B pos H1N1 , Influenza A - neg  ANTIBIOTICS: 4/15-Azithromycin>>>4/17 4/15 Cleocin>>>4/16 Tamiflu 4/17 >>  4/23 4/18 ceftriaxone>>> 4/20 4/18 levofloxacin>>>4/23 4/18 vanc>>>4/20  SUBJECTIVE:  Patient remains critically ill. No overnight events. She is able to open her eyes but doses not obey commands.   VITAL SIGNS: Temp:  [99.5 F (37.5 C)] 99.5 F (37.5 C) (04/24 1600) Pulse Rate:  [78-136] 97 (04/25 0743) Resp:  [11-22] 16 (04/25 0743) BP: (109-181)/(53-106) 135/64 mmHg (04/25 0743) SpO2:  [98 %-100 %] 99 % (04/25 0600) Arterial Line BP: (168)/(82) 168/82 mmHg (04/24 0900) FiO2 (%):  [40 %] 40 % (04/25 0743) HEMODYNAMICS: CVP:  [16 mmHg] 16 mmHg VENTILATOR SETTINGS: Vent Mode:  [-] CPAP;PSV FiO2 (%):  [40 %] 40 % Set Rate:  [12 bmp] 12 bmp Vt Set:  [500 mL-550 mL] 500 mL PEEP:  [5 cmH20] 5 cmH20 Pressure Support:  [5 cmH20-14 cmH20] 5 cmH20 Plateau Pressure:  [15 cmH20-21 cmH20] 15 cmH20 Note less airtrapping 4/23 Vee 0-4  INTAKE / OUTPUT: Intake/Output     04/24 0701 - 04/25 0700 04/25 0701 - 04/26 0700    I.V. (mL/kg) 230 (1.7)    NG/GT 1660    IV Piggyback     Total Intake(mL/kg) 1890 (14.1)    Urine (mL/kg/hr) 4825 (1.5)    Stool 150 (0)    Total Output 4975     Net -3085            PHYSICAL EXAMINATION: General:  Obese Black female intubated and lightly sedated, paralyzed. Neuro:  Sedated rass -1,  HEENT: increased JVD Cardiovascular:  s1 s 2 RRR no m, no crepitus, ++edema Lungs:  Distant diffuse Abdomen:  Abdomen feels tense but not distended. Normal bowel sounds, no r/g Musculoskeletal:  No obvious deformities.  Skin:  Warm to the touch. No rashes  LABS:  Recent Labs Lab 02/08/13 0842  02/09/13 0431  02/09/13 0800  02/10/13 0450  02/10/13 2146 02/11/13 0400  02/11/13 1346  02/11/13 1958  02/12/13 0500 02/12/13 0508 02/13/13 0425 02/13/13 0823 02/14/13 0420 02/15/13 0500  HGB  --   --   --   < >  --   --  10.4*  --   --  10.8*  --   --   --   --   --  10.9*  --  9.8*  --  10.1* 10.7*  WBC  --   --   --   < >  --   --  9.8  --   --  10.5  --   --   --   --   --  14.3*  --  9.0  --  10.6* 10.2  PLT  --   --   --   < >  --   --  179  --   --  145*  --   --   --   --   --  164  --  153  --  146* 153  NA  --   < >  --   < >  --   < > 134*  < > 129* 136  --   --   < >  --   < > 139  --  143  --  148* 153*  K  --   < >  --   < >  --   < > 4.8  < > 5.2* 4.9  --   --   < >  --   < > 5.3*  --  4.9  --  3.5 3.3*  CL  --   < >  --   < >  --   < > 102  < > 95* 98  --   --   < >  --   < > 98  --  103  --  103 105  CO2  --   < >  --   < >  --   < > 20  < > 23 26  --   --   < >  --   < > 32  --  33*  --  38* 43*  GLUCOSE  --   < >  --   < >  --   < > 125*  < > 150* 133*  --   --   < >  --   < > 122*  --  121*  --  121* 116*  BUN  --   < >  --   < >  --   < > 50*  < > 66* 67*  --   --   < >  --   < > 90*  --  105*  --  116* 120*  CREATININE  --   < >  --   < >  --   < > 4.01*  < > 4.29* 4.22*  --   --   < >  --   < > 4.10*  --  4.09*  --  3.69* 3.33*  CALCIUM  --   < >  --   < >   --   < > 7.5*  < > 7.2* 7.1*  --   --   < >  --   < > 7.4*  --  7.9*  --  8.7 9.5  MG  --   --   --   --   --   --   --   --   --   --   --   --   --   --   --   --   --  3.3*  --   --   --   PHOS  --   --   --   --   --   --   --   --   --   --   --   --   --   --   < > 9.1*  --  8.6*  --  5.2* 4.5  AST  --   --   --   < >  --   --  29  --   --   --   --   --   --   --   --  72*  --   --   --  52*  --   ALT  --   --   --   < >  --   --  25  --   --   --   --   --   --   --   --  63*  --   --   --  58*  --   ALKPHOS  --   --   --   < >  --   --  34*  --   --   --   --   --   --   --   --  38*  --   --   --  32*  --   BILITOT  --   --   --   < >  --   --  0.1*  --   --   --   --   --   --   --   --  0.2*  --   --   --  0.2*  --   PROT  --   --   --   < >  --   --  5.9*  --   --   --   --   --   --   --   --  6.7  --   --   --  6.2  --   ALBUMIN  --   --   --   < >  --   --  2.7*  --   --   --   --   --   --   --   --  2.9*  --  2.7*  --  2.7* 2.7*  APTT  --   --   --   --  33  --   --   --   --   --   --   --   --   --   --   --   --   --   --   --   --   INR  --   --   --   --  1.14  --   --   --   --   --   --   --   --   --   --   --   --   --   --   --   --   LATICACIDVEN 1.4  --   --   --   --   --   --   --   --   --   --   --   --   --   --   --   --   --   --   --   --   TROPONINI  --   --   --   --   --   --   --   --  <0.30 <0.30  --  <0.30  --   --   --   --   --   --   --   --   --   PHART  --   < > 7.182*  --   --   < >  --   < >  --   --   < >  --   --  7.222*  --   --  7.237*  --  7.290*  --   --   PCO2ART  --   < > 56.8*  --   --   < >  --   < >  --   --   < >  --   --  65.9*  --   --  77.9*  --  75.2*  --   --   PO2ART  --   < > 95.0  --   --   < >  --   < >  --   --   < >  --   --  66.0*  --   --  99.0  --  134.0*  --   --   < > = values in this interval not displayed.  Recent Labs Lab 02/14/13 1256 02/14/13 1558 02/14/13 2012 02/15/13 0031 02/15/13 0350  GLUCAP 175* 130*  123* 117* 108*    CXR: 4/25  Improved  mild interstitial edema , ETT out too far, advanced 2CM    ASSESSMENT / PLAN: Principal Problem:   Acute respiratory failure Active Problems:   Asthma exacerbation   Severe obesity (BMI >= 40)   Influenza B   Status asthmaticus   Moderate protein-energy malnutrition   Elevated liver enzymes   PULMONARY A: Acute Respiratory Failure Status asthmaticus / Influenza B pos Ventilator dyssynchrony, improved  Permissive hypercapnia Increased edema 4/19 CT chest 4/15 neg PE Discontinued bicarb 4/21 P:   Taper steroids BDErs  q4h PSV wean as tol Trach for Monday as per ENT   CARDIOVASCULAR A: Overload component. Hemodynamically stable. Peak Net/in + 17L. Hypertension getting controlled.  2D Echo 4/24 EF 65-70%, no WMA, mild LVH. Grade 1D Dysfunction.  P:  kvo Hold lasix as per renal Scheduled  per tube Clonidine increased  0.3 mg tid Scheduled per tube Cardizem po 60 mg q6hr   Prn Hydralazine IV 5-25 mg q4hr for SBP >175 - she has not needed.   RENAL A: Acute renal failure. Creatinine stable at around 3.69 >>3.33.  Volume excess, renal says ARF d/t contrast induced ATN. ANA, C3 /C4, Mpo/pr-3, GBM antibodies >>> negative Hypernatremia 148>>153. Hypokalemia K 3.5>>3.3  UOP 4.79L (still +10L) P:   Hold IV lasix 160 mg  q12hr Renal following and appreciated , may need CRRT/HD On free water per tube 200 ml q6hrs.>>increase to 250q6h Will continue to aim for negative balance Replete potassium  GASTROINTESTINAL A:  Tolerated TF, Mild elevation in liver enzymes. Moderate protein malnutrition. Diarrhea C.diff - negative. P:  H2 blocker Cont TF  HEMATOLOGIC A:  DVt prevention P:  sub q hep  INFECTIOUS A:  Influenza B,SUper infection?  Off antibiotics 4/23.  Off Tamiflu 4/23 P:   Monitor for fever and leukocytosis Trend CBC  ENDOCRINE A:  No Acute Findings  P:  SSI on systemic steroids  NEUROLOGIC A:  Sedated, improved  vent synchrony. Off IV sedation drips 4/23.  P:   Try to modify RASS to 0 to -1 with  klonopin per tube and get off drips. D/c fent patch   TODAY'S SUMMARY: 47 yo AAF with status asthmaticus d/t influenza B, ARF . HTN.  , cont diuresis, Renal function better, diuresed well.  Consulted ENT for Trach for 4/28.  CC    Signed:  Dow Adolph, MD PGY-1 Internal Medicine Teaching Service Pager: [phone] 02/15/2013 8:29 AM   Dorcas Carrow Beeper  [phone]  Cell  [phone]  If no response or cell goes to voicemail, call beeper [phone]

## 2013-02-15 NOTE — Consult Note (Signed)
Reason for Consult:respiratory failure Referring Physician: CCM  Kathleen Rodriguez is an 47 y.o. female.  HPI: 47 year old female admitted 4/15 with respiratory failure due to asthma exacerbation related to influenza and was ultimately intubated 4/17.  She has also had acute renal failure.  She has been unable to wean from the ventilator and tracheostomy is requested.   Past Medical History  Diagnosis Date  . Arthritis   . Asthma     Past Surgical History  Procedure Laterality Date  . Tubal ligation    . Cesarean section      History reviewed. No pertinent family history.  Social History:  reports that she has quit smoking. She has never used smokeless tobacco. She reports that she does not drink alcohol or use illicit drugs.  Allergies:  Allergies  Allergen Reactions  . Other Hives and Itching    Ketchup  . Tomato Hives and Itching    Medications: I have reviewed the patient's current medications.  Results for orders placed during the hospital encounter of 02/05/13 (from the past 48 hour(s))  GLUCOSE, CAPILLARY     Status: Abnormal   Collection Time    02/13/13  3:07 PM      Result Value Range   Glucose-Capillary 130 (*) 70 - 99 mg/dL   Comment 1 Notify RN    GLUCOSE, CAPILLARY     Status: Abnormal   Collection Time    02/13/13  8:49 PM      Result Value Range   Glucose-Capillary 130 (*) 70 - 99 mg/dL  GLUCOSE, CAPILLARY     Status: Abnormal   Collection Time    02/14/13 12:17 AM      Result Value Range   Glucose-Capillary 118 (*) 70 - 99 mg/dL  CBC     Status: Abnormal   Collection Time    02/14/13  4:20 AM      Result Value Range   WBC 10.6 (*) 4.0 - 10.5 K/uL   RBC 3.67 (*) 3.87 - 5.11 MIL/uL   Hemoglobin 10.1 (*) 12.0 - 15.0 g/dL   HCT 40.9 (*) 81.1 - 91.4 %   MCV 82.0  78.0 - 100.0 fL   MCH 27.5  26.0 - 34.0 pg   MCHC 33.6  30.0 - 36.0 g/dL   RDW 78.2 (*) 95.6 - 21.3 %   Platelets 146 (*) 150 - 400 K/uL  PHOSPHORUS     Status: Abnormal   Collection Time     02/14/13  4:20 AM      Result Value Range   Phosphorus 5.2 (*) 2.3 - 4.6 mg/dL  COMPREHENSIVE METABOLIC PANEL     Status: Abnormal   Collection Time    02/14/13  4:20 AM      Result Value Range   Sodium 148 (*) 135 - 145 mEq/L   Potassium 3.5  3.5 - 5.1 mEq/L   Comment: DELTA CHECK NOTED   Chloride 103  96 - 112 mEq/L   CO2 38 (*) 19 - 32 mEq/L   Glucose, Bld 121 (*) 70 - 99 mg/dL   BUN 086 (*) 6 - 23 mg/dL   Creatinine, Ser 5.78 (*) 0.50 - 1.10 mg/dL   Calcium 8.7  8.4 - 46.9 mg/dL   Total Protein 6.2  6.0 - 8.3 g/dL   Albumin 2.7 (*) 3.5 - 5.2 g/dL   AST 52 (*) 0 - 37 U/L   ALT 58 (*) 0 - 35 U/L   Alkaline Phosphatase 32 (*)  39 - 117 U/L   Total Bilirubin 0.2 (*) 0.3 - 1.2 mg/dL   GFR calc non Af Amer 14 (*) >90 mL/min   GFR calc Af Amer 16 (*) >90 mL/min   Comment:            The eGFR has been calculated     using the CKD EPI equation.     This calculation has not been     validated in all clinical     situations.     eGFR's persistently     <90 mL/min signify     possible Chronic Kidney Disease.  GLUCOSE, CAPILLARY     Status: Abnormal   Collection Time    02/14/13  4:32 AM      Result Value Range   Glucose-Capillary 120 (*) 70 - 99 mg/dL  GLUCOSE, CAPILLARY     Status: Abnormal   Collection Time    02/14/13  7:54 AM      Result Value Range   Glucose-Capillary 104 (*) 70 - 99 mg/dL  GLUCOSE, CAPILLARY     Status: Abnormal   Collection Time    02/14/13 12:56 PM      Result Value Range   Glucose-Capillary 175 (*) 70 - 99 mg/dL  GLUCOSE, CAPILLARY     Status: Abnormal   Collection Time    02/14/13  3:58 PM      Result Value Range   Glucose-Capillary 130 (*) 70 - 99 mg/dL  GLUCOSE, CAPILLARY     Status: Abnormal   Collection Time    02/14/13  8:12 PM      Result Value Range   Glucose-Capillary 123 (*) 70 - 99 mg/dL  GLUCOSE, CAPILLARY     Status: Abnormal   Collection Time    02/15/13 12:31 AM      Result Value Range   Glucose-Capillary 117 (*) 70 -  99 mg/dL  GLUCOSE, CAPILLARY     Status: Abnormal   Collection Time    02/15/13  3:50 AM      Result Value Range   Glucose-Capillary 108 (*) 70 - 99 mg/dL  CBC     Status: Abnormal   Collection Time    02/15/13  5:00 AM      Result Value Range   WBC 10.2  4.0 - 10.5 K/uL   RBC 3.85 (*) 3.87 - 5.11 MIL/uL   Hemoglobin 10.7 (*) 12.0 - 15.0 g/dL   HCT 16.1 (*) 09.6 - 04.5 %   MCV 83.6  78.0 - 100.0 fL   MCH 27.8  26.0 - 34.0 pg   MCHC 33.2  30.0 - 36.0 g/dL   RDW 40.9 (*) 81.1 - 91.4 %   Platelets 153  150 - 400 K/uL  RENAL FUNCTION PANEL     Status: Abnormal   Collection Time    02/15/13  5:00 AM      Result Value Range   Sodium 153 (*) 135 - 145 mEq/L   Potassium 3.3 (*) 3.5 - 5.1 mEq/L   Chloride 105  96 - 112 mEq/L   CO2 43 (*) 19 - 32 mEq/L   Comment: CRITICAL RESULT CALLED TO, READ BACK BY AND VERIFIED WITH:     K MERCER,RN [id number] WILDERK   Glucose, Bld 116 (*) 70 - 99 mg/dL   BUN 213 (*) 6 - 23 mg/dL   Creatinine, Ser 0.86 (*) 0.50 - 1.10 mg/dL   Calcium 9.5  8.4 - 57.8 mg/dL   Phosphorus  4.5  2.3 - 4.6 mg/dL   Albumin 2.7 (*) 3.5 - 5.2 g/dL   GFR calc non Af Amer 15 (*) >90 mL/min   GFR calc Af Amer 18 (*) >90 mL/min   Comment:            The eGFR has been calculated     using the CKD EPI equation.     This calculation has not been     validated in all clinical     situations.     eGFR's persistently     <90 mL/min signify     possible Chronic Kidney Disease.  GLUCOSE, CAPILLARY     Status: Abnormal   Collection Time    02/15/13  8:14 AM      Result Value Range   Glucose-Capillary 114 (*) 70 - 99 mg/dL   Comment 1 Notify RN    GLUCOSE, CAPILLARY     Status: Abnormal   Collection Time    02/15/13 11:42 AM      Result Value Range   Glucose-Capillary 148 (*) 70 - 99 mg/dL    Dg Chest Port 1 View  02/15/2013  *RADIOLOGY REPORT*  Clinical Data: Intubated.  PORTABLE CHEST - 1 VIEW  Comparison: 02/14/2013  Findings: NG tube has pulled back and the tip is  now in the upper thoracic esophagus.  Endotracheal tube and right central line are unchanged.  Borderline heart size.  No confluent airspace opacities or effusions.  Mild pulmonary venous congestion changes, stable.  IMPRESSION: NG tube has been retracted and the tip is in the upper thoracic esophagus.  Borderline heart size, mild vascular congestion.   Original Report Authenticated By: Charlett Nose, M.D.    Dg Chest Port 1 View  02/14/2013  *RADIOLOGY REPORT*  Clinical Data: Evaluate endotracheal tube positioning  PORTABLE CHEST - 1 VIEW  Comparison: 02/13/2013; 02/12/2013; 02/11/2013  Findings:  Grossly unchanged cardiac silhouette and mediastinal contours. Stable positioning of support apparatus.  No pneumothorax. Pulmonary vasculature remains indistinct.  Improved aeration of the bilateral lung bases.  No definite pleural effusion.  Unchanged bones.  IMPRESSION: 1.  Stable positioning of support apparatus.  No pneumothorax. 2.  Persistent findings most suggestive of pulmonary venous congestion/mild pulmonary edema, though note, atypical infection may have a similar appearance   Original Report Authenticated By: Tacey Ruiz, MD     Review of Systems  Unable to perform ROS: intubated   Blood pressure 126/61, pulse 75, temperature 97.5 F (36.4 C), temperature source Oral, resp. rate 15, height 5\' 4"  (1.626 m), weight 133.7 kg (294 lb 12.1 oz), last menstrual period 01/23/2013, SpO2 98.00%. Physical Exam  Constitutional: She appears well-developed and well-nourished. No distress.  Obese.  Intubated, sedated.  HENT:  Head: Normocephalic and atraumatic.  Right Ear: External ear normal.  Left Ear: External ear normal.  Nose: Nose normal.  Mouth/Throat: Oropharynx is clear and moist.  Eyes: Conjunctivae are normal. Pupils are equal, round, and reactive to light.  Neck: Normal range of motion. Neck supple.  Cardiovascular: Normal rate.   Respiratory: Effort normal.  Mechanically ventilated.  GI:   Did not examine.  Genitourinary:  Did not examine.  Neurological:  Sedated. Intubated.  Skin: Skin is warm.  Psychiatric:  Intubated, sedated.    Assessment/Plan: Respiratory failure Scheduled for Monday for tracheostomy in OR.  Kathleen Rodriguez 02/15/2013, 12:54 PM

## 2013-02-15 NOTE — Progress Notes (Signed)
Clinical Social Worker received phone call from [employer] indicating pt's spouse had call unit requesting to speak with this CSW regarding an "emergency".  CSW phoned spouse; spouse inquiring about assistance with day care.  CSW provided resource information to spouse to assist with this.  CSW to continue to follow and assist as needed.    Angelia Mould, MSW, [place] [phone]

## 2013-02-15 NOTE — Progress Notes (Signed)
Subjective: Interval History: none.  Objective: Vital signs in last 24 hours: Temp:  [99.5 F (37.5 C)] 99.5 F (37.5 C) (04/24 1600) Pulse Rate:  [78-136] 97 (04/25 0743) Resp:  [11-22] 16 (04/25 0743) BP: (109-181)/(53-106) 135/64 mmHg (04/25 0743) SpO2:  [98 %-100 %] 99 % (04/25 0600) Arterial Line BP: (168-172)/(82-87) 168/82 mmHg (04/24 0900) FiO2 (%):  [40 %] 40 % (04/25 0743) Weight change:   Intake/Output from previous day: 04/24 0701 - 04/25 0700 In: 1890 [I.V.:230; NG/GT:1660] Out: 4975 [Urine:4825; Stool:150] Intake/Output this shift:    General appearance: moderately obese and entub but awake Resp: diminished breath sounds bilaterally and rhonchi bilaterally Cardio: regular rate and rhythm GI: obese, pos bs, soft Extremities: edema 2+  Lab Results:  Recent Labs  02/14/13 0420 02/15/13 0500  WBC 10.6* 10.2  HGB 10.1* 10.7*  HCT 30.1* 32.2*  PLT 146* 153   BMET:  Recent Labs  02/14/13 0420 02/15/13 0500  NA 148* 153*  K 3.5 3.3*  CL 103 105  CO2 38* 43*  GLUCOSE 121* 116*  BUN 116* 120*  CREATININE 3.69* 3.33*  CALCIUM 8.7 9.5   No results found for this basename: PTH,  in the last 72 hours Iron Studies:  Recent Labs  02/13/13 0755  IRON 153*  TIBC 295    Studies/Results: Dg Chest Port 1 View  02/14/2013  *RADIOLOGY REPORT*  Clinical Data: Evaluate endotracheal tube positioning  PORTABLE CHEST - 1 VIEW  Comparison: 02/13/2013; 02/12/2013; 02/11/2013  Findings:  Grossly unchanged cardiac silhouette and mediastinal contours. Stable positioning of support apparatus.  No pneumothorax. Pulmonary vasculature remains indistinct.  Improved aeration of the bilateral lung bases.  No definite pleural effusion.  Unchanged bones.  IMPRESSION: 1.  Stable positioning of support apparatus.  No pneumothorax. 2.  Persistent findings most suggestive of pulmonary venous congestion/mild pulmonary edema, though note, atypical infection may have a similar  appearance   Original Report Authenticated By: Tacey Ruiz, MD     I have reviewed the patient's current medications.  Assessment/Plan: 1 AKI improving Cr.  Diuresing. ^ Sna with diruesis, getting free water.  K, low and alkalemic.  Give KCl.Contrast induce ATN 2 Asthma per CCM, weaning 3 Obesity 4 Anemia 5 Nutrition on TF P KCl, free water, hold Lasix    LOS: 10 days   Aideliz Garmany L 02/15/2013,7:52 AM

## 2013-02-15 NOTE — Progress Notes (Addendum)
Clinical Social Worker met with pt's spouse at bedside.  CSW provided emotional support.  CSW reviewed potential need for Vent SNF.  CSW reviewed Vent SNF options.  Spouse stated understanding.  CSW reviewed [place] Application; spouse states pt does not have any bank accounts.  CSW to initiate Vent SNF search.  Spouse states [employer] is scheduled to come to his apartment next week to provide assistance.     Angelia Mould, MSW, [place] [phone]

## 2013-02-16 ENCOUNTER — Inpatient Hospital Stay (HOSPITAL_COMMUNITY): Payer: Managed Care, Other (non HMO)

## 2013-02-16 DIAGNOSIS — N179 Acute kidney failure, unspecified: Secondary | ICD-10-CM | POA: Diagnosis not present

## 2013-02-16 LAB — GLUCOSE, CAPILLARY: Glucose-Capillary: 107 mg/dL — ABNORMAL HIGH (ref 70–99)

## 2013-02-16 LAB — RENAL FUNCTION PANEL
BUN: 109 mg/dL — ABNORMAL HIGH (ref 6–23)
Calcium: 9.5 mg/dL (ref 8.4–10.5)
Glucose, Bld: 107 mg/dL — ABNORMAL HIGH (ref 70–99)
Phosphorus: 3.4 mg/dL (ref 2.3–4.6)

## 2013-02-16 LAB — CBC
HCT: 31.8 % — ABNORMAL LOW (ref 36.0–46.0)
Hemoglobin: 10.4 g/dL — ABNORMAL LOW (ref 12.0–15.0)
MCH: 27.7 pg (ref 26.0–34.0)
MCHC: 32.7 g/dL (ref 30.0–36.0)
RDW: 15.9 % — ABNORMAL HIGH (ref 11.5–15.5)

## 2013-02-16 MED ORDER — DEXTROSE 5 % IV SOLN
INTRAVENOUS | Status: DC
  Administered 2013-02-16 – 2013-02-19 (×4): via INTRAVENOUS

## 2013-02-16 MED ORDER — LEVALBUTEROL HCL 1.25 MG/0.5ML IN NEBU
0.6300 mg | INHALATION_SOLUTION | Freq: Four times a day (QID) | RESPIRATORY_TRACT | Status: DC
  Filled 2013-02-16 (×4): qty 0.26

## 2013-02-16 MED ORDER — PREDNISONE 5 MG/ML PO CONC
30.0000 mg | Freq: Every day | ORAL | Status: DC
  Filled 2013-02-16 (×2): qty 6

## 2013-02-16 MED ORDER — FREE WATER
300.0000 mL | Status: DC
  Administered 2013-02-16 – 2013-02-17 (×7): 300 mL

## 2013-02-16 MED ORDER — IPRATROPIUM BROMIDE 0.02 % IN SOLN
0.5000 mg | Freq: Four times a day (QID) | RESPIRATORY_TRACT | Status: DC
  Administered 2013-02-16 – 2013-02-20 (×13): 0.5 mg via RESPIRATORY_TRACT
  Filled 2013-02-16 (×15): qty 2.5

## 2013-02-16 MED ORDER — CLONIDINE HCL 0.1 MG PO TABS
0.1000 mg | ORAL_TABLET | Freq: Three times a day (TID) | ORAL | Status: DC
  Administered 2013-02-16 – 2013-02-18 (×7): 0.1 mg via ORAL
  Filled 2013-02-16 (×9): qty 1

## 2013-02-16 MED ORDER — LEVALBUTEROL HCL 1.25 MG/0.5ML IN NEBU
0.6300 mg | INHALATION_SOLUTION | Freq: Four times a day (QID) | RESPIRATORY_TRACT | Status: DC
  Administered 2013-02-16 – 2013-02-17 (×4): 0.63 mg via RESPIRATORY_TRACT
  Filled 2013-02-16 (×9): qty 0.26

## 2013-02-16 NOTE — Progress Notes (Signed)
Subjective: Interval History: none.  Objective: Vital signs in last 24 hours: Temp:  [97.5 F (36.4 C)-98.4 F (36.9 C)] 98.4 F (36.9 C) (04/26 0734) Pulse Rate:  [66-108] 78 (04/26 0600) Resp:  [10-26] 11 (04/26 0600) BP: (117-143)/(58-75) 127/67 mmHg (04/26 0600) SpO2:  [97 %-100 %] 100 % (04/26 0600) FiO2 (%):  [40 %] 40 % (04/26 0502) Weight change:   Intake/Output from previous day: 04/25 0701 - 04/26 0700 In: 3120 [I.V.:230; NG/GT:2890] Out: 3625 [Urine:3175; Stool:450] Intake/Output this shift:    General appearance: sedated, sleepy but arousable Resp: diminished breath sounds bilaterally and rhonchi bilaterally Cardio: regular rate and rhythm and systolic murmur: holosystolic 2/6, blowing at lower left sternal border GI: obese, pos bs,,liver down 5 cm Extremities: edema 2+ edema  Lab Results:  Recent Labs  02/15/13 0500 02/16/13 0500  WBC 10.2 9.0  HGB 10.7* 10.4*  HCT 32.2* 31.8*  PLT 153 128*   BMET:  Recent Labs  02/15/13 0500 02/16/13 0500  NA 153* 155*  K 3.3* 4.1  CL 105 109  CO2 43* 43*  GLUCOSE 116* 107*  BUN 120* 109*  CREATININE 3.33* 2.58*  CALCIUM 9.5 9.5   No results found for this basename: PTH,  in the last 72 hours Iron Studies:  Recent Labs  02/13/13 0755  IRON 153*  TIBC 295    Studies/Results: Dg Chest Port 1 View  02/16/2013  *RADIOLOGY REPORT*  Clinical Data: Ventilator patient  PORTABLE CHEST - 1 VIEW  Comparison: 02/15/2013; 02/14/2013  Findings: Grossly unchanged cardiac silhouette and mediastinal contours.  Stable positioning of support apparatus.  No pneumothorax.  Mild cephalization of flow without frank evidence of edema.  Grossly unchanged perihilar heterogeneous opacities favored to represent atelectasis.  No new focal airspace opacity.  No pleural effusion or pneumothorax.  Unchanged bones.  IMPRESSION: 1.  Stable positioning of support apparatus.  No pneumothorax. 2.  Grossly unchanged findings of mild pulmonary  venous congestion versus atypical infection.   Original Report Authenticated By: Tacey Ruiz, MD    Dg Chest Port 1 View  02/15/2013  *RADIOLOGY REPORT*  Clinical Data: Intubated.  PORTABLE CHEST - 1 VIEW  Comparison: 02/14/2013  Findings: NG tube has pulled back and the tip is now in the upper thoracic esophagus.  Endotracheal tube and right central line are unchanged.  Borderline heart size.  No confluent airspace opacities or effusions.  Mild pulmonary venous congestion changes, stable.  IMPRESSION: NG tube has been retracted and the tip is in the upper thoracic esophagus.  Borderline heart size, mild vascular congestion.   Original Report Authenticated By: Charlett Nose, M.D.     I have reviewed the patient's current medications.  Assessment/Plan: 1 AKI vol xs, but ^ SNa, will use D5.  Losing free water with D and use of Lasix recently.  Getting per tube also.  GFR improving. 2 Obesity 3 Asthma/VDRF per CCM 4 Nutrition TF 5 ^ LFTs F/u  On Mon   P D5, still try for neg balance    LOS: 11 days   Shamanda Len L 02/16/2013,7:35 AM

## 2013-02-16 NOTE — Progress Notes (Addendum)
PULMONARY  / CRITICAL CARE MEDICINE  Name: Kathleen Rodriguez MRN: [id number] DOB: 1966-05-30    ADMISSION DATE:  02/05/2013 CONSULTATION DATE:  02/07/2013  REFERRING MD :  [facility] PRIMARY SERVICE: [facility]  CHIEF COMPLAINT:  SOB  BRIEF PATIENT DESCRIPTION: 47 yo BF with hx of asthma, arthritis, and obesity admitted on 4/15 with SOB in significant respiratory distress. Patient had a negative CT pulmonary angiogram on 4/15. 4/16 patient came back positive with Influenza B and started on Tamiflu on 4/17. Patient intubated 4/17 in acute respiratory distress.  SIGNIFICANT EVENTS / STUDIES:  4/15-Chest CT negative 4/16-Influenza B positive 4/18 - Renal US- no hydro 4/24 - Echo EF 65-70%, mild LVH, grade 1 DD  LINES / TUBES: 4/17- ETT >>> 4/18 rt ij >>> 4/18 A-line >>4/24  CULTURES: 4/17 trach aspirate >>NEG 4/17-BC >> No growth  4/17 UC >> Negative 4/16 Influenza B pos H1N1 , Influenza A - neg  ANTIBIOTICS: 4/15-Azithromycin>>>4/17 4/15 Cleocin>>>4/16 Tamiflu 4/17 >>  4/23 4/18 ceftriaxone>>> 4/20 4/18 levofloxacin>>>4/23 4/18 vanc>>>4/20  SUBJECTIVE:  More alert, weaning well  VITAL SIGNS: Temp:  [97.7 F (36.5 C)-98.4 F (36.9 C)] 98.4 F (36.9 C) (04/26 0734) Pulse Rate:  [66-108] 88 (04/26 0800) Resp:  [10-26] 14 (04/26 0800) BP: (117-143)/(58-75) 134/66 mmHg (04/26 0800) SpO2:  [97 %-100 %] 97 % (04/26 0800) FiO2 (%):  [40 %] 40 % (04/26 0800) HEMODYNAMICS: CVP:  [12 mmHg-15 mmHg] 12 mmHg VENTILATOR SETTINGS: Vent Mode:  [-] PSV FiO2 (%):  [40 %] 40 % Set Rate:  [12 bmp] 12 bmp Vt Set:  [500 mL] 500 mL PEEP:  [5 cmH20] 5 cmH20 Pressure Support:  [5 cmH20] 5 cmH20 Plateau Pressure:  [14 cmH20-25 cmH20] 21 cmH20  INTAKE / OUTPUT: Intake/Output     04/25 0701 - 04/26 0700 04/26 0701 - 04/27 0700   I.V. (mL/kg) 230 (1.7)    NG/GT 2890    Total Intake(mL/kg) 3120 (23.3)    Urine (mL/kg/hr) 3175 (1)    Stool 450 (0.1)    Total Output 3625     Net -505            PHYSICAL EXAMINATION: General:  Obese Black female intubated and lightly sedated,  Neuro:  Sedated rass -1,  HEENT: increased JVD Cardiovascular:  s1 s 2 RRR no m, no crepitus, 1+edema Lungs:  Distant diffuse Abdomen:  Abdomen feels tense but not distended. Normal bowel sounds, no r/g Musculoskeletal:  No obvious deformities.  Skin:  Warm to the touch. No rashes  LABS:  Recent Labs Lab 02/10/13 0450  02/10/13 2146 02/11/13 0400  02/11/13 1346  02/11/13 1958  02/12/13 0500 02/12/13 0508 02/13/13 0425 02/13/13 0823 02/14/13 0420 02/15/13 0500 02/16/13 0500  HGB 10.4*  --   --  10.8*  --   --   --   --   --  10.9*  --  9.8*  --  10.1* 10.7* 10.4*  WBC 9.8  --   --  10.5  --   --   --   --   --  14.3*  --  9.0  --  10.6* 10.2 9.0  PLT 179  --   --  145*  --   --   --   --   --  164  --  153  --  146* 153 128*  NA 134*  < > 129* 136  --   --   < >  --   < > 139  --  143  --  148* 153* 155*  K 4.8  < > 5.2* 4.9  --   --   < >  --   < > 5.3*  --  4.9  --  3.5 3.3* 4.1  CL 102  < > 95* 98  --   --   < >  --   < > 98  --  103  --  103 105 109  CO2 20  < > 23 26  --   --   < >  --   < > 32  --  33*  --  38* 43* 43*  GLUCOSE 125*  < > 150* 133*  --   --   < >  --   < > 122*  --  121*  --  121* 116* 107*  BUN 50*  < > 66* 67*  --   --   < >  --   < > 90*  --  105*  --  116* 120* 109*  CREATININE 4.01*  < > 4.29* 4.22*  --   --   < >  --   < > 4.10*  --  4.09*  --  3.69* 3.33* 2.58*  CALCIUM 7.5*  < > 7.2* 7.1*  --   --   < >  --   < > 7.4*  --  7.9*  --  8.7 9.5 9.5  MG  --   --   --   --   --   --   --   --   --   --   --  3.3*  --   --   --   --   PHOS  --   --   --   --   --   --   --   --   < > 9.1*  --  8.6*  --  5.2* 4.5 3.4  AST 29  --   --   --   --   --   --   --   --  72*  --   --   --  52*  --   --   ALT 25  --   --   --   --   --   --   --   --  63*  --   --   --  58*  --   --   ALKPHOS 34*  --   --   --   --   --   --   --   --  38*  --   --   --  32*  --   --   BILITOT  0.1*  --   --   --   --   --   --   --   --  0.2*  --   --   --  0.2*  --   --   PROT 5.9*  --   --   --   --   --   --   --   --  6.7  --   --   --  6.2  --   --   ALBUMIN 2.7*  --   --   --   --   --   --   --   --  2.9*  --  2.7*  --  2.7* 2.7* 2.6*  TROPONINI  --   --  <  0.30 <0.30  --  <0.30  --   --   --   --   --   --   --   --   --   --   PHART  --   < >  --   --   < >  --   --  7.222*  --   --  7.237*  --  7.290*  --   --   --   PCO2ART  --   < >  --   --   < >  --   --  65.9*  --   --  77.9*  --  75.2*  --   --   --   PO2ART  --   < >  --   --   < >  --   --  66.0*  --   --  99.0  --  134.0*  --   --   --   < > = values in this interval not displayed.  Recent Labs Lab 02/15/13 1551 02/15/13 1940 02/15/13 2357 02/16/13 0406 02/16/13 0710  GLUCAP 159* 128* 110* 107* 107*    CXR: 4/26  Improved  mild interstitial edema ,   ASSESSMENT / PLAN: Principal Problem:   Acute respiratory failure Active Problems:   Asthma exacerbation   Severe obesity (BMI >= 40)   Influenza B   Status asthmaticus   Moderate protein-energy malnutrition   Elevated liver enzymes   Acute renal failure   PULMONARY A: Acute Respiratory Failure Status asthmaticus / Influenza B pos Ventilator dyssynchrony, resolved   P:   Taper steroids BDErs  q6h Trach for Monday as per ENT   CARDIOVASCULAR A: Overload component. Hemodynamically stable.  Hypertension getting controlled.  2D Echo 4/24 EF 65-70%, no WMA, mild LVH. Grade 1D Dysfunction.  P:  kvo Hold lasix as per renal Scheduled  per tube Clonidine increased  0.3 mg tid Scheduled per tube Cardizem po 60 mg q6hr     RENAL A: Acute renal failure. Creatinine stable at around 3.69 >>3.33.  Volume excess, renal says ARF d/t contrast induced ATN. ANA, C3 /C4, Mpo/pr-3, GBM antibodies >>> negative Hypernatremia 148>>153>155. Hypokalemia K 3.5>>3.3  P:   Increase free water Hold lasix K supp  GASTROINTESTINAL A:  Tolerated TF, Mild  elevation in liver enzymes. Moderate protein malnutrition. Diarrhea C.diff - negative. P:  H2 blocker Cont TF  HEMATOLOGIC A:  DVt prevention P:  sub q hep  INFECTIOUS A:  Influenza B,SUper infection?  Off antibiotics 4/23.  Off Tamiflu 4/23 P:   Monitor for fever and leukocytosis Trend CBC OFF ABX  ENDOCRINE A:  No Acute Findings  P:  SSI on systemic steroids  NEUROLOGIC A:  Sedated, improved vent synchrony. Off IV sedation drips 4/23.  P:    off drips.    TODAY'S SUMMARY: 47 yo AAF with status asthmaticus d/t influenza B, ARF . HTN.  , cont diuresis, Renal function better, diuresed well.  Consulted ENT for Trach for 4/28.  CC 2min      02/16/2013 8:51 AM   Mariel Sleet Beeper  [phone]  Cell  [phone]  If no response or cell goes to voicemail, call beeper [phone]

## 2013-02-17 ENCOUNTER — Inpatient Hospital Stay (HOSPITAL_COMMUNITY): Payer: Managed Care, Other (non HMO)

## 2013-02-17 DIAGNOSIS — N179 Acute kidney failure, unspecified: Secondary | ICD-10-CM

## 2013-02-17 LAB — RENAL FUNCTION PANEL
Albumin: 2.6 g/dL — ABNORMAL LOW (ref 3.5–5.2)
BUN: 89 mg/dL — ABNORMAL HIGH (ref 6–23)
Chloride: 111 mEq/L (ref 96–112)
Creatinine, Ser: 2.07 mg/dL — ABNORMAL HIGH (ref 0.50–1.10)

## 2013-02-17 LAB — GLUCOSE, CAPILLARY
Glucose-Capillary: 98 mg/dL (ref 70–99)
Glucose-Capillary: 105 mg/dL — ABNORMAL HIGH (ref 70–99)
Glucose-Capillary: 124 mg/dL — ABNORMAL HIGH (ref 70–99)
Glucose-Capillary: 106 mg/dL — ABNORMAL HIGH (ref 70–99)

## 2013-02-17 LAB — CBC
HCT: 31 % — ABNORMAL LOW (ref 36.0–46.0)
MCH: 27.9 pg (ref 26.0–34.0)
MCHC: 32.9 g/dL (ref 30.0–36.0)
MCV: 84.7 fL (ref 78.0–100.0)
RDW: 16.1 % — ABNORMAL HIGH (ref 11.5–15.5)
WBC: 9.7 10*3/uL (ref 4.0–10.5)

## 2013-02-17 LAB — BASIC METABOLIC PANEL
BUN: 80 mg/dL — ABNORMAL HIGH (ref 6–23)
CO2: 41 mEq/L (ref 19–32)
Calcium: 9.3 mg/dL (ref 8.4–10.5)
Creatinine, Ser: 1.95 mg/dL — ABNORMAL HIGH (ref 0.50–1.10)
GFR calc non Af Amer: 29 mL/min — ABNORMAL LOW (ref >90)
Glucose, Bld: 120 mg/dL — ABNORMAL HIGH (ref 70–99)
Sodium: 154 mEq/L — ABNORMAL HIGH (ref 135–145)

## 2013-02-17 MED ORDER — DILTIAZEM HCL 60 MG PO TABS
60.0000 mg | ORAL_TABLET | Freq: Four times a day (QID) | ORAL | Status: DC
  Administered 2013-02-17: 60 mg via ORAL
  Filled 2013-02-17 (×5): qty 1

## 2013-02-17 MED ORDER — PANTOPRAZOLE SODIUM 40 MG PO PACK
40.0000 mg | PACK | Freq: Every day | ORAL | Status: DC
  Filled 2013-02-17: qty 20

## 2013-02-17 MED ORDER — POTASSIUM CHLORIDE CRYS ER 20 MEQ PO TBCR
40.0000 meq | EXTENDED_RELEASE_TABLET | Freq: Two times a day (BID) | ORAL | Status: AC
  Administered 2013-02-17: 40 meq via ORAL
  Filled 2013-02-17: qty 2

## 2013-02-17 MED ORDER — PREDNISONE 5 MG/ML PO CONC
10.0000 mg | Freq: Every day | ORAL | Status: DC
  Filled 2013-02-17 (×2): qty 2

## 2013-02-17 MED ORDER — DILTIAZEM 12 MG/ML ORAL SUSPENSION
60.0000 mg | Freq: Four times a day (QID) | ORAL | Status: DC
  Administered 2013-02-17: 60 mg via ORAL
  Filled 2013-02-17 (×3): qty 6

## 2013-02-17 MED ORDER — FENTANYL CITRATE 0.05 MG/ML IJ SOLN: 50.0000 ug | INTRAMUSCULAR | Status: DC | PRN

## 2013-02-17 MED ORDER — PREDNISONE 5 MG/ML PO CONC
10.0000 mg | Freq: Every day | ORAL | Status: DC
  Filled 2013-02-17: qty 2

## 2013-02-17 MED ORDER — FAMOTIDINE 40 MG/5ML PO SUSR
40.0000 mg | Freq: Every day | ORAL | Status: DC
  Filled 2013-02-17: qty 5

## 2013-02-17 MED ORDER — CLONAZEPAM 0.5 MG PO TABS
0.5000 mg | ORAL_TABLET | Freq: Two times a day (BID) | ORAL | Status: DC
  Administered 2013-02-17: 0.5 mg via ORAL
  Filled 2013-02-17: qty 1

## 2013-02-17 MED ORDER — POTASSIUM CHLORIDE 20 MEQ/15ML (10%) PO LIQD
40.0000 meq | Freq: Two times a day (BID) | ORAL | Status: DC
  Administered 2013-02-17: 40 meq via ORAL
  Filled 2013-02-17 (×2): qty 30

## 2013-02-17 MED ORDER — CLONAZEPAM 1 MG PO TABS
1.0000 mg | ORAL_TABLET | Freq: Two times a day (BID) | ORAL | Status: DC
  Administered 2013-02-17: 1 mg
  Filled 2013-02-17: qty 1

## 2013-02-17 MED ORDER — LEVALBUTEROL HCL 0.63 MG/3ML IN NEBU
0.6300 mg | INHALATION_SOLUTION | Freq: Four times a day (QID) | RESPIRATORY_TRACT | Status: DC
  Administered 2013-02-17 – 2013-02-20 (×9): 0.63 mg via RESPIRATORY_TRACT
  Filled 2013-02-17 (×15): qty 3

## 2013-02-17 NOTE — Procedures (Signed)
Extubation Procedure Note  Patient Details:   Name: Kathleen Rodriguez DOB: Mar 13, 1966 MRN: [id number]   Airway Documentation:  Patient weaned successfully from vent. Cuff leak was good. Patient extubated and placed on [place] with O2 running at 4lpm..   Evaluation  O2 sats: stable throughout Complications: No apparent complications Patient did tolerate procedure well. Bilateral Breath Sounds: Clear;Diminished Suctioning: Airway Yes  Clearance Coots 02/17/2013, 12:49 PM

## 2013-02-17 NOTE — Progress Notes (Signed)
Subjective: Interval History: none.  Objective: Vital signs in last 24 hours: Temp:  [98.2 F (36.8 C)-98.4 F (36.9 C)] 98.2 F (36.8 C) (04/27 0753) Pulse Rate:  [69-94] 70 (04/27 0500) Resp:  [12-24] 13 (04/27 0500) BP: (108-140)/(58-82) 108/59 mmHg (04/27 0500) SpO2:  [95 %-100 %] 99 % (04/27 0500) FiO2 (%):  [40 %] 40 % (04/27 0350) Weight:  [128.8 kg (283 lb 15.2 oz)] 128.8 kg (283 lb 15.2 oz) (04/27 0500) Weight change:   Intake/Output from previous day: 04/26 0701 - 04/27 0700 In: 3941.7 [I.V.:756.7; NG/GT:3185] Out: 3160 [Urine:2960; Stool:200] Intake/Output this shift:    General appearance: cooperative, moderately obese and slowed mentation Resp: diminished breath sounds bilaterally and rhonchi bilaterally Cardio: S1, S2 normal and systolic murmur: holosystolic 2/6, blowing at lower left sternal border GI: obese,pos bs, soft, liver down 5 cm Extremities: edema 2+  Lab Results:  Recent Labs  02/16/13 0500 02/17/13 0500  WBC 9.0 9.7  HGB 10.4* 10.2*  HCT 31.8* 31.0*  PLT 128* 131*   BMET:  Recent Labs  02/16/13 0500 02/17/13 0500  NA 155* 156*  K 4.1 3.4*  CL 109 111  CO2 43* 42*  GLUCOSE 107* 112*  BUN 109* 89*  CREATININE 2.58* 2.07*  CALCIUM 9.5 9.8   No results found for this basename: PTH,  in the last 72 hours Iron Studies: No results found for this basename: IRON, TIBC, TRANSFERRIN, FERRITIN,  in the last 72 hours  Studies/Results: Dg Chest Port 1 View  02/16/2013  *RADIOLOGY REPORT*  Clinical Data: Ventilator patient  PORTABLE CHEST - 1 VIEW  Comparison: 02/15/2013; 02/14/2013  Findings: Grossly unchanged cardiac silhouette and mediastinal contours.  Stable positioning of support apparatus.  No pneumothorax.  Mild cephalization of flow without frank evidence of edema.  Grossly unchanged perihilar heterogeneous opacities favored to represent atelectasis.  No new focal airspace opacity.  No pleural effusion or pneumothorax.  Unchanged bones.   IMPRESSION: 1.  Stable positioning of support apparatus.  No pneumothorax. 2.  Grossly unchanged findings of mild pulmonary venous congestion versus atypical infection.   Original Report Authenticated By: Tacey Ruiz, MD     I have reviewed the patient's current medications.  Assessment/Plan: 1 AKI improving, should cont to improve.  Vol xs but with ^ SNa,hold of diuretics. 2 ^ SNa cont free water 3 anemia stabel 4 Obesity 5 VDRF per CCM 6 HTN better, lower meds. P Free water , lower bp meds.    LOS: 12 days   Jaleia Hanke L 02/17/2013,8:07 AM

## 2013-02-17 NOTE — Progress Notes (Signed)
CO2-41 called into [facility]

## 2013-02-17 NOTE — Progress Notes (Signed)
PULMONARY  / CRITICAL CARE MEDICINE  Name: Nysa Sarin MRN: [id number] DOB: 31-Jul-1966    ADMISSION DATE:  02/05/2013 CONSULTATION DATE:  02/07/2013  REFERRING MD :  [facility] PRIMARY SERVICE: LCCM  CHIEF COMPLAINT:  SOB  BRIEF PATIENT DESCRIPTION: 47 yo BF with hx of asthma, arthritis, and obesity admitted on 4/15 with SOB in significant respiratory distress. Patient had a negative CT pulmonary angiogram on 4/15. 4/16 patient came back positive with Influenza B and started on Tamiflu on 4/17. Patient intubated 4/17 in acute respiratory distress.  SIGNIFICANT EVENTS / STUDIES:  4/15-Chest CT negative 4/16-Influenza B positive 4/18 - Renal US- no hydro 4/24 - Echo EF 65-70%, mild LVH, grade 1 DD  LINES / TUBES: 4/17- ETT >>>4/27 4/18 rt ij >>> 4/18 A-line >>4/24  CULTURES: 4/17 trach aspirate >>NEG 4/17-BC >> No growth  4/17 UC >> Negative 4/16 Influenza B pos H1N1 , Influenza A - neg  ANTIBIOTICS: 4/15-Azithromycin>>>4/17 4/15 Cleocin>>>4/16 Tamiflu 4/17 >>  4/23 4/18 ceftriaxone>>> 4/20 4/18 levofloxacin>>>4/23 4/18 vanc>>>4/20  SUBJECTIVE:  More alert, weaning well. Follows commands. She complaining of epigastric pain.   VITAL SIGNS: Temp:  [98.2 F (36.8 C)-98.4 F (36.9 C)] 98.2 F (36.8 C) (04/27 0753) Pulse Rate:  [69-94] 71 (04/27 0800) Resp:  [12-24] 14 (04/27 0800) BP: (108-140)/(58-82) 123/64 mmHg (04/27 0800) SpO2:  [95 %-100 %] 98 % (04/27 0800) FiO2 (%):  [40 %] 40 % (04/27 0350) Weight:  [283 lb 15.2 oz (128.8 kg)] 283 lb 15.2 oz (128.8 kg) (04/27 0500) HEMODYNAMICS: CVP:  [13 mmHg-14 mmHg] 14 mmHg VENTILATOR SETTINGS: Vent Mode:  [-] PRVC FiO2 (%):  [40 %] 40 % Set Rate:  [12 bmp] 12 bmp Vt Set:  [500 mL] 500 mL PEEP:  [5 cmH20] 5 cmH20 Pressure Support:  [8 cmH20] 8 cmH20 Plateau Pressure:  [17 cmH20-19 cmH20] 17 cmH20  INTAKE / OUTPUT: Intake/Output     04/26 0701 - 04/27 0700 04/27 0701 - 04/28 0700   I.V. (mL/kg) 836.7 (6.5) 80 (0.6)    NG/GT 3205 40   Total Intake(mL/kg) 4041.7 (31.4) 120 (0.9)   Urine (mL/kg/hr) 2960 (1) 325 (1.1)   Stool 200 (0.1)    Total Output 3160 325   Net +881.7 -205          PHYSICAL EXAMINATION: General:  Obese Black female intubated, fully alert  Neuro:  Sedated rass = 0,  HEENT: increased JVD Cardiovascular:  s1 s 2 RRR no m, no crepitus, 1+edema Lungs:  Distant diffuse Abdomen:  Epigastric tenderness. No palpable masses. Normal bowel sounds, no r/g Musculoskeletal:  No obvious deformities.  Skin:  Warm to the touch. No rashes  LABS:  Recent Labs Lab 02/10/13 2146  02/11/13 0400  02/11/13 1346  02/11/13 1958  02/12/13 0500 02/12/13 0508 02/13/13 0425 02/13/13 0823 02/14/13 0420 02/15/13 0500 02/16/13 0500 02/17/13 0500  HGB  --   < > 10.8*  --   --   --   --   --  10.9*  --  9.8*  --  10.1* 10.7* 10.4* 10.2*  WBC  --   < > 10.5  --   --   --   --   --  14.3*  --  9.0  --  10.6* 10.2 9.0 9.7  PLT  --   < > 145*  --   --   --   --   --  164  --  153  --  146* 153 128* 131*  NA  129*  --  136  --   --   < >  --   < > 139  --  143  --  148* 153* 155* 156*  K 5.2*  --  4.9  --   --   < >  --   < > 5.3*  --  4.9  --  3.5 3.3* 4.1 3.4*  CL 95*  --  98  --   --   < >  --   < > 98  --  103  --  103 105 109 111  CO2 23  --  26  --   --   < >  --   < > 32  --  33*  --  38* 43* 43* 42*  GLUCOSE 150*  --  133*  --   --   < >  --   < > 122*  --  121*  --  121* 116* 107* 112*  BUN 66*  --  67*  --   --   < >  --   < > 90*  --  105*  --  116* 120* 109* 89*  CREATININE 4.29*  --  4.22*  --   --   < >  --   < > 4.10*  --  4.09*  --  3.69* 3.33* 2.58* 2.07*  CALCIUM 7.2*  --  7.1*  --   --   < >  --   < > 7.4*  --  7.9*  --  8.7 9.5 9.5 9.8  MG  --   --   --   --   --   --   --   --   --   --  3.3*  --   --   --   --   --   PHOS  --   --   --   --   --   --   --   < > 9.1*  --  8.6*  --  5.2* 4.5 3.4 3.2  AST  --   --   --   --   --   --   --   --  72*  --   --   --  52*  --   --   --    ALT  --   --   --   --   --   --   --   --  63*  --   --   --  58*  --   --   --   ALKPHOS  --   --   --   --   --   --   --   --  38*  --   --   --  32*  --   --   --   BILITOT  --   --   --   --   --   --   --   --  0.2*  --   --   --  0.2*  --   --   --   PROT  --   --   --   --   --   --   --   --  6.7  --   --   --  6.2  --   --   --   ALBUMIN  --   --   --   --   --   --   --   < >  2.9*  --  2.7*  --  2.7* 2.7* 2.6* 2.6*  TROPONINI <0.30  --  <0.30  --  <0.30  --   --   --   --   --   --   --   --   --   --   --   PHART  --   --   --   < >  --   --  7.222*  --   --  7.237*  --  7.290*  --   --   --   --   PCO2ART  --   --   --   < >  --   --  65.9*  --   --  77.9*  --  75.2*  --   --   --   --   PO2ART  --   --   --   < >  --   --  66.0*  --   --  99.0  --  134.0*  --   --   --   --   < > = values in this interval not displayed.  Recent Labs Lab 02/16/13 1535 02/16/13 1925 02/16/13 2336 02/17/13 0336 02/17/13 0721  GLUCAP 138* 123* 109* 98 105*    CXR: 4/27  Improved  mild interstitial edema ,   ASSESSMENT / PLAN: Principal Problem:   Acute respiratory failure Active Problems:   Asthma exacerbation   Severe obesity (BMI >= 40)   Influenza B   Status asthmaticus   Moderate protein-energy malnutrition   Elevated liver enzymes   Acute renal failure   PULMONARY A: Acute Respiratory Failure Status asthmaticus / Influenza B pos Ventilator dyssynchrony, resolved   P:   Taper steroids - may be related to the epigastric pain  BDErs  q6h Extubation trial 4/27, if flies then cancel trach  CARDIOVASCULAR A: Overload component. Hemodynamically stable.  Hypertension getting controlled.  2D Echo 4/24 EF 65-70%, no WMA, mild LVH. Grade 1D Dysfunction.  P:  kvo Hold lasix as per renal Scheduled  per tube Clonidine increased  0.3 mg tid Scheduled per tube Cardizem po 60 mg q6hr     RENAL A: Acute renal failure. Creatinine stable at around 3.69 >>3.33.  Volume excess,  renal says ARF d/t contrast induced ATN. ANA, C3 /C4, Mpo/pr-3, GBM antibodies >>> negative Hypernatremia 155>>156 Hypokalemia K 3.3>> 4.1>>3.4 P:   Increase D5W to @75  cc/hr, bmet q12hrs Replete K per tube  Hold lasix  GASTROINTESTINAL A:  Tolerated TF, Mild elevation in liver enzymes. Moderate protein malnutrition. Diarrhea C.diff - negative. Epigastric pain P:  On PPI Cont TF Taper steroids and wean off over the next few days  Add Pepcid   HEMATOLOGIC A:  DVt prevention P:  sub q hep  INFECTIOUS A:  Influenza B,Super infection?  Off antibiotics 4/23.  Off Tamiflu 4/23 P:   Monitor for fever and leukocytosis Trend CBC OFF ABX  ENDOCRINE A:  No Acute Findings  P:  SSI on systemic steroids  NEUROLOGIC A:  Sedated, improved vent synchrony. Off IV sedation drips 4/23.  P:   Off drips On Prn clonazepam reduce dose Prn fentanyl  Prn   TODAY'S SUMMARY: 47 yo AAF with status asthmaticus d/t influenza B, ARF . HTN.  cont diuresis, Renal function better, diuresed well.  Pt markedly better 4/27, plan extubation trial before committing to [facility].    Signed:  Jessee Avers, MD PGY-1 Internal Medicine Teaching Service Pager:  [phone] 02/17/2013, 9:16 AM   Mariel Sleet Beeper  [phone]  Cell  [phone]  If no response or cell goes to voicemail, call beeper [phone]

## 2013-02-18 ENCOUNTER — Inpatient Hospital Stay (HOSPITAL_COMMUNITY): Payer: Managed Care, Other (non HMO)

## 2013-02-18 ENCOUNTER — Encounter (HOSPITAL_COMMUNITY)
Admission: EM | Disposition: A | Discharge status: Home / Self Care | Payer: Self-pay | Source: Home / Self Care | Attending: Pulmonary Disease

## 2013-02-18 DIAGNOSIS — R5381 Other malaise: Secondary | ICD-10-CM

## 2013-02-18 DIAGNOSIS — J9600 Acute respiratory failure, unspecified whether with hypoxia or hypercapnia: Secondary | ICD-10-CM

## 2013-02-18 LAB — CBC
MCH: 27.8 pg (ref 26.0–34.0)
MCHC: 32.9 g/dL (ref 30.0–36.0)
Platelets: 122 10*3/uL — ABNORMAL LOW (ref 150–400)
RBC: 3.71 MIL/uL — ABNORMAL LOW (ref 3.87–5.11)

## 2013-02-18 LAB — URINE MICROSCOPIC-ADD ON

## 2013-02-18 LAB — URINALYSIS, ROUTINE W REFLEX MICROSCOPIC
Glucose, UA: NEGATIVE mg/dL
Specific Gravity, Urine: 1.013 (ref 1.005–1.030)
Urobilinogen, UA: 0.2 mg/dL (ref 0.0–1.0)
pH: 8 (ref 5.0–8.0)

## 2013-02-18 LAB — BASIC METABOLIC PANEL
Calcium: 8.9 mg/dL (ref 8.4–10.5)
Creatinine, Ser: 1.82 mg/dL — ABNORMAL HIGH (ref 0.50–1.10)
GFR calc Af Amer: 37 mL/min — ABNORMAL LOW (ref >90)

## 2013-02-18 LAB — PHOSPHORUS: Phosphorus: 4 mg/dL (ref 2.3–4.6)

## 2013-02-18 SURGERY — CREATION, TRACHEOSTOMY
Anesthesia: General

## 2013-02-18 MED ORDER — PREDNISONE 10 MG PO TABS
10.0000 mg | ORAL_TABLET | Freq: Every day | ORAL | Status: DC
  Administered 2013-02-18 – 2013-02-20 (×3): 10 mg via ORAL
  Filled 2013-02-18 (×4): qty 1

## 2013-02-18 MED ORDER — BOOST / RESOURCE BREEZE PO LIQD
1.0000 | Freq: Three times a day (TID) | ORAL | Status: DC
  Administered 2013-02-18 – 2013-02-21 (×5): 1 via ORAL

## 2013-02-18 MED ORDER — FAMOTIDINE 40 MG PO TABS
40.0000 mg | ORAL_TABLET | Freq: Every day | ORAL | Status: DC
  Administered 2013-02-18: 40 mg via ORAL
  Filled 2013-02-18: qty 1

## 2013-02-18 MED ORDER — POTASSIUM CHLORIDE CRYS ER 20 MEQ PO TBCR
40.0000 meq | EXTENDED_RELEASE_TABLET | Freq: Every day | ORAL | Status: AC
  Administered 2013-02-18: 40 meq via ORAL
  Filled 2013-02-18: qty 2

## 2013-02-18 MED ORDER — PANTOPRAZOLE SODIUM 40 MG PO TBEC
40.0000 mg | DELAYED_RELEASE_TABLET | Freq: Every day | ORAL | Status: DC
  Administered 2013-02-18 – 2013-02-21 (×4): 40 mg via ORAL
  Filled 2013-02-18 (×4): qty 1

## 2013-02-18 MED ORDER — BUDESONIDE 0.5 MG/2ML IN SUSP
0.5000 mg | Freq: Two times a day (BID) | RESPIRATORY_TRACT | Status: DC
  Administered 2013-02-18 – 2013-02-20 (×4): 0.5 mg via RESPIRATORY_TRACT
  Filled 2013-02-18 (×7): qty 2

## 2013-02-18 MED ORDER — DILTIAZEM HCL ER COATED BEADS 240 MG PO CP24
240.0000 mg | ORAL_CAPSULE | Freq: Every day | ORAL | Status: DC
  Administered 2013-02-18 – 2013-02-21 (×4): 240 mg via ORAL
  Filled 2013-02-18 (×4): qty 1

## 2013-02-18 MED ORDER — BIOTENE DRY MOUTH MT LIQD
15.0000 mL | Freq: Two times a day (BID) | OROMUCOSAL | Status: DC
  Administered 2013-02-18 – 2013-02-21 (×6): 15 mL via OROMUCOSAL

## 2013-02-18 NOTE — Progress Notes (Signed)
NUTRITION FOLLOW UP  Intervention:   Resource Breeze po TID, each supplement provides 250 kcal and 9 grams of protein.  Nutrition Dx:   Inadequate oral intake now related to poor appetite as evidenced by 50% meal completion. Ongoing.  New Goal:   Intake to meet >90% of estimated nutrition needs, unmet.  Monitor:   PO intake, weight trend, labs.  Assessment:   Patient was extubated 4/27. Diet advanced to regular. Phosphorus is now WNL, potassium is slightly low. AKI improving; UOP increased. Per RN, patient is eating okay, consuming ~50% of meals.   Height: Ht Readings from Last 1 Encounters:  02/07/13 5\' 4"  (1.626 m)    Weight Status:   Wt Readings from Last 1 Encounters:  02/18/13 285 lb 7.9 oz (129.5 kg)  02/12/13  306 lb 10.6 oz (139.1 kg)  02/08/13  275 lb 12.7 oz (125.1 kg)   Weight is trending back down.  Body mass index is 48.98 kg/(m^2). Class 3, extreme obesity  Re-estimated needs:  Kcal: 1950-2150 kcals  Protein: 120-130 gm Fluid: per MD   Skin: no problems noted  Diet Order: General    Intake/Output Summary (Last 24 hours) at 02/18/13 1545 Last data filed at 02/18/13 1300  Gross per 24 hour  Intake 2021.67 ml  Output   2910 ml  Net -888.33 ml    Last BM: 4/28   Labs:   Recent Labs Lab 02/12/13 0500 02/13/13 0425  02/16/13 0500 02/17/13 0500 02/17/13 1756 02/18/13 0400  NA 139 143  < > 155* 156* 154* 150*  K 5.3* 4.9  < > 4.1 3.4* 3.2* 3.7  CL 98 103  < > 109 111 110 107  CO2 32 33*  < > 43* 42* 41* 38*  BUN 90* 105*  < > 109* 89* 80* 67*  CREATININE 4.10* 4.09*  < > 2.58* 2.07* 1.95* 1.82*  CALCIUM 7.4* 7.9*  < > 9.5 9.8 9.3 8.9  MG  --  3.3*  --   --  2.3  --   --   PHOS 9.1* 8.6*  < > 3.4 3.2  --  4.0  GLUCOSE 122* 121*  < > 107* 112* 120* 116*  < > = values in this interval not displayed.  CBG (last 3)   Recent Labs  02/18/13 0346 02/18/13 0747 02/18/13 1219  GLUCAP 109* 96 106*    Scheduled Meds: . antiseptic oral  rinse  15 mL Mouth Rinse BID  . budesonide (PULMICORT) nebulizer solution  0.5 mg Nebulization BID  . diltiazem  240 mg Oral Daily  . heparin subcutaneous  5,000 Units Subcutaneous Q8H  . ipratropium  0.5 mg Nebulization Q6H  . levalbuterol  0.63 mg Nebulization Q6H  . pantoprazole  40 mg Oral Daily  . predniSONE  10 mg Oral Q breakfast    Continuous Infusions: . dextrose 50 mL/hr at 02/18/13 0840    Joaquin Courts, RD, LDN, CNSC Pager [phone] After Hours Pager [phone]

## 2013-02-18 NOTE — Progress Notes (Signed)
PULMONARY  / CRITICAL CARE MEDICINE  Name: Kathleen Rodriguez MRN: [id number] DOB: May 03, 1966    ADMISSION DATE:  02/05/2013 CONSULTATION DATE:  02/07/2013  REFERRING MD :  [facility] PRIMARY SERVICE: LCCM  CHIEF COMPLAINT:  SOB  BRIEF PATIENT DESCRIPTION: 47 yo BF with hx of asthma, arthritis, and obesity admitted on 4/15 with SOB in significant respiratory distress. Patient had a negative CT pulmonary angiogram on 4/15. 4/16 patient came back positive with Influenza B and started on Tamiflu on 4/17. Patient intubated 4/17 in acute respiratory distress.  SIGNIFICANT EVENTS / STUDIES:  4/15-Chest CT negative 4/16-Influenza B positive 4/18 - Renal US- no hydro 4/24 - Echo EF 65-70%, mild LVH, grade 1 DD  LINES / TUBES: 4/17- ETT >>>4/27 4/18 rt ij >>>4/28 4/18 A-line >>4/24  CULTURES: 4/17 trach aspirate >>NEG 4/17-BC >> No growth  4/17 UC >> Negative 4/16 Influenza B pos H1N1 , Influenza A - neg  ANTIBIOTICS: 4/15-Azithromycin>>>4/17 4/15 Cleocin>>>4/16 Tamiflu 4/17 >>  4/23 4/18 ceftriaxone>>> 4/20 4/18 levofloxacin>>>4/23 4/18 vanc>>>4/20  SUBJECTIVE:  Extubated. Feels better. No complaints. Asks about when she will go home.   VITAL SIGNS: Temp:  [98 F (36.7 C)-98.2 F (36.8 C)] 98.2 F (36.8 C) (04/28 0748) Pulse Rate:  [74-95] 91 (04/28 0739) Resp:  [15-24] 20 (04/28 0739) BP: (117-146)/(55-84) 127/65 mmHg (04/28 0739) SpO2:  [96 %-100 %] 96 % (04/28 0739) FiO2 (%):  [40 %] 40 % (04/27 1207) Weight:  [285 lb 7.9 oz (129.5 kg)] 285 lb 7.9 oz (129.5 kg) (04/28 0500) HEMODYNAMICS: CVP:  [0 mmHg] 0 mmHg VENTILATOR SETTINGS: Vent Mode:  [-] PSV FiO2 (%):  [40 %] 40 % PEEP:  [5 cmH20] 5 cmH20 Pressure Support:  [5 cmH20] 5 cmH20  INTAKE / OUTPUT: Intake/Output     04/27 0701 - 04/28 0700 04/28 0701 - 04/29 0700   P.O. 480    I.V. (mL/kg) 1730 (13.4)    NG/GT 120    Total Intake(mL/kg) 2330 (18)    Urine (mL/kg/hr) 3145 (1)    Stool 150 (0)    Total Output  3295     Net -965            PHYSICAL EXAMINATION: General:  Obese Black female, fully alert and interactive. Seated in bed. Neuro:  Sedated rass = 0,  HEENT: increased JVD Cardiovascular:  s1 s 2 RRR no m, no crepitus, 1+edema Lungs:  Distant diffuse Abdomen:   No epigastric tenderness. No palpable masses. Normal bowel sounds, no r/g Musculoskeletal:  No obvious deformities.  Skin:  Warm to the touch. No rashes  LABS:  Recent Labs Lab 02/11/13 1346  02/11/13 1958  02/12/13 0500 02/12/13 0508 02/13/13 0425 02/13/13 0823 02/14/13 0420 02/15/13 0500 02/16/13 0500 02/17/13 0500 02/17/13 1756 02/18/13 0400  HGB  --   --   --   < > 10.9*  --  9.8*  --  10.1* 10.7* 10.4* 10.2*  --  10.3*  WBC  --   --   --   < > 14.3*  --  9.0  --  10.6* 10.2 9.0 9.7  --  8.2  PLT  --   --   --   < > 164  --  153  --  146* 153 128* 131*  --  122*  NA  --   < >  --   < > 139  --  143  --  148* 153* 155* 156* 154* 150*  K  --   < >  --   < >  5.3*  --  4.9  --  3.5 3.3* 4.1 3.4* 3.2* 3.7  CL  --   < >  --   < > 98  --  103  --  103 105 109 111 110 107  CO2  --   < >  --   < > 32  --  33*  --  38* 43* 43* 42* 41* 38*  GLUCOSE  --   < >  --   < > 122*  --  121*  --  121* 116* 107* 112* 120* 116*  BUN  --   < >  --   < > 90*  --  105*  --  116* 120* 109* 89* 80* 67*  CREATININE  --   < >  --   < > 4.10*  --  4.09*  --  3.69* 3.33* 2.58* 2.07* 1.95* 1.82*  CALCIUM  --   < >  --   < > 7.4*  --  7.9*  --  8.7 9.5 9.5 9.8 9.3 8.9  MG  --   --   --   --   --   --  3.3*  --   --   --   --  2.3  --   --   PHOS  --   --   --   < > 9.1*  --  8.6*  --  5.2* 4.5 3.4 3.2  --  4.0  AST  --   --   --   --  72*  --   --   --  52*  --   --   --   --   --   ALT  --   --   --   --  63*  --   --   --  58*  --   --   --   --   --   ALKPHOS  --   --   --   --  38*  --   --   --  32*  --   --   --   --   --   BILITOT  --   --   --   --  0.2*  --   --   --  0.2*  --   --   --   --   --   PROT  --   --   --   --  6.7  --    --   --  6.2  --   --   --   --   --   ALBUMIN  --   --   --   < > 2.9*  --  2.7*  --  2.7* 2.7* 2.6* 2.6*  --   --   TROPONINI <0.30  --   --   --   --   --   --   --   --   --   --   --   --   --   PHART  --   --  7.222*  --   --  7.237*  --  7.290*  --   --   --   --   --   --   PCO2ART  --   --  65.9*  --   --  77.9*  --  75.2*  --   --   --   --   --   --  PO2ART  --   --  66.0*  --   --  99.0  --  134.0*  --   --   --   --   --   --   < > = values in this interval not displayed.  Recent Labs Lab 02/17/13 1102 02/17/13 1514 02/17/13 1918 02/17/13 2344 02/18/13 0346  GLUCAP 124* 109* 106* 108* 109*    CXR: 4/27  Improved  mild interstitial edema ,   ASSESSMENT / PLAN: Principal Problem:   Acute respiratory failure Active Problems:   Asthma exacerbation   Severe obesity (BMI >= 40)   Influenza B   Status asthmaticus   Moderate protein-energy malnutrition   Elevated liver enzymes   Acute renal failure   PULMONARY A: Acute Respiratory Failure Status asthmaticus / Influenza B pos Ventilator dyssynchrony, resolved  Extubated 4/27  P:   Taper steroids - may be related to the epigastric pain  BDErs  q6h Hold trach   CARDIOVASCULAR A: Overload component. Hemodynamically stable.  Hypertension getting controlled.  2D Echo 4/24 EF 65-70%, no WMA, mild LVH. Grade 1D Dysfunction. Holding lasix.  P:  kvo still +18L >>>+9.6L net  Scheduled  per tube Clonidine increased  0.3 mg tid Scheduled per tube Cardizem po 60 mg q6hr    RENAL A: Acute renal failure. Creatinine improving 1.95 >>1.82 Volume excess, renal says ARF d/t contrast induced ATN. ANA, C3 /C4, Mpo/pr-3, GBM antibodies >>> negative Hypernatremia 156>>154>>150 Hypokalemia 3.4>>3.7 P:   Continue with reduced D5W to @50  cc/hr, BMET tomorrow  Replete K  Holding lasix > diuresing well on her own D/c foley  GASTROINTESTINAL A:  Tolerated TF, Mild elevation in liver enzymes. Moderate protein malnutrition.  Diarrhea C.diff - negative. Epigastric pain P:  On PPI Cont TF Taper steroids and wean off over the next few days  D/c Pepcid  D/c rectal tube   HEMATOLOGIC A:  DVt prevention P:  sub q hep  INFECTIOUS A:  Influenza B,Super infection?  Off antibiotics 4/23.  Off Tamiflu 4/23 P:   Monitor for fever and leukocytosis Trend CBC OFF ABX  ENDOCRINE A:  No Acute Findings  P:  SSI on systemic steroids Change to tid ac qhs cbg monitoring   NEUROLOGIC A:  Sedated, improved vent synchrony. Off IV sedation drips 4/23.  P:   Off drips On Prn clonazepam reduce dose Off sedation Prn fentanyl  Transfer to Step Down Consult PT Out of bed to chair  Consult CIR    TODAY'S SUMMARY: 47 yo AAF with status asthmaticus d/t influenza B, ARF. HTN.  Continues to diuerize without lasix, renal function better.  Extubated 4/27. Hold trach. Transfer to Step down. Consult inpatient rehab.   Signed:  Jessee Avers, MD PGY-1 Internal Medicine Teaching Service Pager: [phone] 02/18/2013, 8:07 AM    I have interviewed and examined the patient and reviewed the database. I have formulated the assessment and plan as reflected in the note above with amendments made by me.   Transfer to SDU To remian on PCCM service Push PT CIR consult pending D/C Foley, rectal tube  Merton Border, MD;  PCCM service; [place] [phone]

## 2013-02-18 NOTE — Progress Notes (Signed)
Right Peripheral 22 gauge NSL started per order . Site prepped with chlorhexidine, good flash, patient tolerated procedure well. Documentation in patient record completed.

## 2013-02-18 NOTE — Progress Notes (Signed)
UR Completed.  Geofrey Silliman Jane [phone] 02/18/2013  

## 2013-02-18 NOTE — Evaluation (Signed)
Occupational Therapy Evaluation Patient Details Name: Kathleen Rodriguez MRN: [id number] DOB: 05-09-1966 Today's Date: 02/18/2013 Time: 5784-6962 OT Time Calculation (min): 29 min  OT Assessment / Plan / Recommendation Clinical Impression   This 47 y.o. Female with h/o asthma, arthritis and obesity admitted 4/15 with SOB and resp distress.  Pt positive for influenza B and started on Tamiflu.  Pt intubated 02/07/13 and extubated 02/17/13.  Pt. Presents to OT with generalized weakness and deconditioning.  She will benefit from continued OT to maximize safety and independence with BADLs to facilitate transfer to next venue of care.  Recommend CIR    OT Assessment  Patient needs continued OT Services    Follow Up Recommendations  CIR;Supervision/Assistance - 24 hour    Barriers to Discharge Other (comment) (uncertain) Pt states she will have 24 hour assist, but unable to state who will provide this  Equipment Recommendations  None recommended by OT    Recommendations for Other Services Rehab consult  Frequency  Min 2X/week    Precautions / Restrictions Precautions Precautions: Fall Precaution Comments: deconditioned VDRF x 10 days Restrictions Weight Bearing Restrictions: No   Pertinent Vitals/Pain Pt trialed on RA, but sats 87-89%; Sats on 2L EOB 87-88%.  O2 bumped to 3L and sats improved to 93%.  Pt with c/o dizziness EOB with BP 115/59    ADL  Eating/Feeding: +1 Total assistance Where Assessed - Eating/Feeding: Bed level Grooming: +1 Total assistance Where Assessed - Grooming: Supine, head of bed up Upper Body Bathing: +1 Total assistance Where Assessed - Upper Body Bathing: Supine, head of bed up;Supine, head of bed flat;Rolling right and/or left Lower Body Bathing: +2 Total assistance Lower Body Bathing: Patient Percentage: 0% Where Assessed - Lower Body Bathing: Supine, head of bed up;Supine, head of bed flat;Rolling right and/or left Upper Body Dressing: +1 Total  assistance Where Assessed - Upper Body Dressing: Supine, head of bed up Lower Body Dressing: +2 Total assistance Lower Body Dressing: Patient Percentage: 0% Where Assessed - Lower Body Dressing: Supine, head of bed up;Supine, head of bed flat;Rolling right and/or left Toilet Transfer: +1 Total assistance (unable`) Toileting - Clothing Manipulation and Hygiene: +2 Total assistance Toileting - Clothing Manipulation and Hygiene: Patient Percentage: 0% Where Assessed - Toileting Clothing Manipulation and Hygiene: Supine, head of bed flat;Rolling right and/or left Transfers/Ambulation Related to ADLs: Unable.  Pt only able to move to EOB ADL Comments: Pt unable to perform ADLs due to bil. UE weakness    OT Diagnosis: Generalized weakness  OT Problem List: Decreased strength;Decreased range of motion;Decreased activity tolerance;Impaired balance (sitting and/or standing);Decreased cognition;Decreased knowledge of use of DME or AE;Cardiopulmonary status limiting activity;Obesity;Impaired UE functional use OT Treatment Interventions: Self-care/ADL training;Therapeutic exercise;DME and/or AE instruction;Therapeutic activities;Patient/family education;Balance training   OT Goals Acute Rehab OT Goals OT Goal Formulation: With patient Time For Goal Achievement: 03/04/13 Potential to Achieve Goals: Good ADL Goals Pt Will Perform Grooming: with min assist;Sitting, chair;Supported ADL Goal: Grooming - Progress: Goal set today Pt Will Perform Upper Body Bathing: with mod assist;Sitting, chair ADL Goal: Upper Body Bathing - Progress: Goal set today Pt Will Transfer to Toilet: with 2+ total assist;3-in-1;Other (comment) (pt ~50%) ADL Goal: Toilet Transfer - Progress: Goal set today Additional ADL Goal #1: Pt will demonstrates bil. UE strength at least 3+/5 throughout as needed for ADLs ADL Goal: Additional Goal #1 - Progress: Goal set today Additional ADL Goal #2: Pt will participate in 25 mins  therapeutic activities with 2 rest breaks ADL Goal:  Additional Goal #2 - Progress: Goal set today  Visit Information  Last OT Received On: 02/18/13 Assistance Needed: +2    Subjective Data  Subjective: "I feel dizzy" Patient Stated Goal:  To go home   Prior Functioning     Home Living Lives With: Family Available Help at Discharge: Family Type of Home: Apartment Home Access: Level entry Home Layout: One level Bathroom Shower/Tub: Engineer, manufacturing systems: Standard Home Adaptive Equipment: Straight cane Prior Function Level of Independence: Independent with assistive device(s) (used straight cane) Driving: No Communication Communication: No difficulties (speaks softly) Dominant Hand: Right         Vision/Perception Vision - History Baseline Vision: No visual deficits Patient Visual Report: No change from baseline   Cognition  Cognition Arousal/Alertness: Awake/alert Behavior During Therapy: Flat affect Overall Cognitive Status: Impaired/Different from baseline Area of Impairment:  (Pt slow to process information)    Extremity/Trunk Assessment Right Upper Extremity Assessment RUE ROM/Strength/Tone: Deficits RUE ROM/Strength/Tone Deficits: 2/5 RUE Coordination: Deficits RUE Coordination Deficits: due to weakness Left Upper Extremity Assessment LUE ROM/Strength/Tone: Deficits LUE ROM/Strength/Tone Deficits: 2/5 LUE Coordination: Deficits LUE Coordination Deficits: due to weakness Trunk Assessment Trunk Assessment: Other exceptions Trunk Exceptions: Leans to Lt., likely due to uneven bed; however, pt unable to self correct      Mobility Bed Mobility Bed Mobility: Supine to Sit;Sitting - Scoot to Edge of Bed;Sit to Supine Supine to Sit: 1: +2 Total assist;HOB elevated Supine to Sit: Patient Percentage: 30% Sitting - Scoot to Edge of Bed: 1: +2 Total assist Sitting - Scoot to Edge of Bed: Patient Percentage: 0% Sit to Supine: 1: +2 Total assist Sit  to Supine: Patient Percentage: 0% Details for Bed Mobility Assistance: Pt requires assist for all aspects.  Was able to assist with lifting shoulders with HOB elevated     Exercise     Balance Balance Balance Assessed: Yes Static Sitting Balance Static Sitting - Balance Support: Bilateral upper extremity supported;Feet supported Static Sitting - Level of Assistance: 3: Mod assist Static Sitting - Comment/# of Minutes: leaning to Left.  Unable to self correct   End of Session OT - End of Session Activity Tolerance: Patient limited by fatigue Patient left: in bed;with call bell/phone within reach Nurse Communication: Need for lift equipment;Mobility status  GO     Jacole Capley M 02/18/2013, 11:13 AM

## 2013-02-18 NOTE — Evaluation (Signed)
Clinical/Bedside Swallow Evaluation Patient Details  Name: Kathleen Rodriguez MRN: [id number] Date of Birth: October 23, 1966  Today's Date: 02/18/2013 Time: 4098-1191 SLP Time Calculation (min): 11 min  Past Medical History:  Past Medical History  Diagnosis Date  . Arthritis   . Asthma    Past Surgical History:  Past Surgical History  Procedure Laterality Date  . Tubal ligation    . Cesarean section     HPI:  47 yo BF with hx of asthma, arthritis, and obesity admitted on 4/15 with SOB in significant respiratory distress. Patient had a negative CT pulmonary angiogram on 4/15. 4/16 patient came back positive with Influenza B and started on Tamiflu on 4/17. Patient intubated 4/17 in acute respiratory distress.   Assessment / Plan / Recommendation Clinical Impression  Pt demonstrated evidence of a mild, likely acute reversible dysphagia resulting from 10 day intubation. Pt with dleayed cough in 25% of trials, exclusively with large consecutive straw sips. With min verbal cues to take single small sips, pt appeared functional. Reviewed rationale and strategy with pt, family and RN. Given overall tolerance of diet up to this point, no further skilled treatment required. SLP will sign off. Please reorder if concerns arise.     Aspiration Risk  Mild    Diet Recommendation Regular;Thin liquid   Liquid Administration via: Cup;Straw Medication Administration: Whole meds with liquid Supervision: Patient able to self feed;Intermittent supervision to cue for compensatory strategies Compensations: Slow rate;Small sips/bites Postural Changes and/or Swallow Maneuvers: Seated upright 90 degrees    Other  Recommendations Oral Care Recommendations: Oral care BID   Follow Up Recommendations  None    Frequency and Duration        Pertinent Vitals/Pain NA    SLP Swallow Goals     Swallow Study Prior Functional Status       General HPI: 47 yo BF with hx of asthma, arthritis, and obesity admitted  on 4/15 with SOB in significant respiratory distress. Patient had a negative CT pulmonary angiogram on 4/15. 4/16 patient came back positive with Influenza B and started on Tamiflu on 4/17. Patient intubated 4/17 in acute respiratory distress. Diet Prior to this Study: Regular;Thin liquids Temperature Spikes Noted: No Respiratory Status: Supplemental O2 delivered via (comment) History of Recent Intubation: Yes Length of Intubations (days): 10 days Date extubated: 02/17/13 Behavior/Cognition: Alert;Cooperative;Pleasant mood Oral Cavity - Dentition: Missing dentition Self-Feeding Abilities: Able to feed self Patient Positioning: Upright in bed Baseline Vocal Quality: Hoarse;Low vocal intensity Volitional Cough: Strong Volitional Swallow: Able to elicit    Oral/Motor/Sensory Function Overall Oral Motor/Sensory Function: Appears within functional limits for tasks assessed   Ice Chips     Thin Liquid Thin Liquid: Impaired Presentation: Straw;Self Fed Pharyngeal  Phase Impairments: Cough - Delayed    Nectar Thick Nectar Thick Liquid: Not tested   Honey Thick Honey Thick Liquid: Not tested   Puree Puree: Not tested   Solid   GO    Solid: Within functional limits      [facility], [place] CCC-SLP [phone]  Claudine Mouton 02/18/2013,9:12 AM

## 2013-02-18 NOTE — Consult Note (Signed)
Physical Medicine and Rehabilitation Consult  Reason for Consult: Deconditioning Referring Physician:  Dr. Sung Amabile.   HPI: Kathleen Rodriguez is a 47 y.o. female with hx of asthma, arthritis, and obesity admitted on 4/15 with SOB in significant respiratory distress due to status asthmatics due to H. Flu.  Patient had a negative CT pulmonary angiogram. Patient intubated 4/17 due to acute respiratory distress. Treated with tamiflu and  IV steroids for asthma exacerbation. Renal consulted for input on acute renal failure and felt AKI multifactorial due to contrast nephropathy, NSAIDS and ischemic ATN.  Renal ultrasound without hydronephrosis. She tolerated extubation yesterday and therapy evaluations done today. Swallow evaluation done and patient started on regular diet. She is diffusely weak and deconditioned. PT, OT, MD recommending CIR.   Review of Systems  Eyes: Negative for blurred vision and double vision.  Respiratory: Positive for cough and shortness of breath.   Gastrointestinal: Negative for nausea, vomiting and abdominal pain.  Genitourinary: Negative for dysuria.  Musculoskeletal: Negative for myalgias, back pain and joint pain.  Neurological: Positive for weakness. Negative for headaches.    Past Medical History  Diagnosis Date  . Arthritis   . Asthma    Past Surgical History  Procedure Laterality Date  . Tubal ligation    . Cesarean section     History reviewed. No pertinent family history.  Social History:  Married. Husband works/has two children 60 and 52 years old.  Smokes 1/2 PPD--plans on quitting. She has never used smokeless tobacco. She reports that she does not drink alcohol or use illicit drugs.   Allergies  Allergen Reactions  . Other Hives and Itching    Ketchup  . Tomato Hives and Itching   Medications Prior to Admission  Medication Sig Dispense Refill  . albuterol (PROVENTIL HFA;VENTOLIN HFA) 108 (90 BASE) MCG/ACT inhaler Inhale 2 puffs into the lungs every  6 (six) hours as needed for wheezing.      . predniSONE (DELTASONE) 10 MG tablet Take 10 mg by mouth daily. Patient took for three days.        Home: Home Living Lives With: Family Available Help at Discharge: Family Type of Home: Apartment Home Access: Level entry Home Layout: One level Bathroom Shower/Tub: Engineer, manufacturing systems: Standard Home Adaptive Equipment: Straight cane  Functional History: Prior Function Driving: No Functional Status:  Mobility: Bed Mobility Bed Mobility: Supine to Sit;Sitting - Scoot to [place] of Bed;Sit to Supine Supine to Sit: 1: +2 Total assist;HOB elevated Supine to Sit: Patient Percentage: 30% Sitting - Scoot to Edge of Bed: 1: +2 Total assist Sitting - Scoot to Edge of Bed: Patient Percentage: 0% Sit to Supine: 1: +2 Total assist Sit to Supine: Patient Percentage: 0%        ADL: ADL Eating/Feeding: +1 Total assistance Where Assessed - Eating/Feeding: Bed level Grooming: +1 Total assistance Where Assessed - Grooming: Supine, head of bed up Upper Body Bathing: +1 Total assistance Where Assessed - Upper Body Bathing: Supine, head of bed up;Supine, head of bed flat;Rolling right and/or left Lower Body Bathing: +2 Total assistance Where Assessed - Lower Body Bathing: Supine, head of bed up;Supine, head of bed flat;Rolling right and/or left Upper Body Dressing: +1 Total assistance Where Assessed - Upper Body Dressing: Supine, head of bed up Lower Body Dressing: +2 Total assistance Where Assessed - Lower Body Dressing: Supine, head of bed up;Supine, head of bed flat;Rolling right and/or left Toilet Transfer: +1 Total assistance (unable`) Transfers/Ambulation Related to ADLs: Unable.  Pt  only able to move to EOB ADL Comments: Pt unable to perform ADLs due to bil. UE weakness  Cognition: Cognition Overall Cognitive Status: Impaired/Different from baseline Arousal/Alertness: Awake/alert Orientation Level: Oriented to person;Oriented  to place;Oriented to time;Oriented to situation Cognition Arousal/Alertness: Awake/alert Behavior During Therapy: Flat affect Overall Cognitive Status: Impaired/Different from baseline Area of Impairment: Problem solving  Blood pressure 129/77, pulse 95, temperature 97.8 F (36.6 C), temperature source Oral, resp. rate 20, height 5\' 4"  (1.626 m), weight 129.5 kg (285 lb 7.9 oz), last menstrual period 01/23/2013, SpO2 90.00%. Physical Exam  Nursing note and vitals reviewed. Constitutional: She is oriented to person, place, and time. She appears well-developed and well-nourished.  Morbidly obese female  HENT:  Head: Normocephalic and atraumatic.  Right Ear: External ear normal.  Left Ear: External ear normal.  Eyes: Conjunctivae and EOM are normal. Pupils are equal, round, and reactive to light.  Neck: Normal range of motion.  Cardiovascular: Normal rate and regular rhythm.   Pulmonary/Chest: Effort normal. She has rhonchi.  Audible wheezes--upper airway.   Abdominal: Bowel sounds are normal. She exhibits distension. There is no tenderness.  Neurological: She is alert and oriented to person, place, and time.  Delayed responses. Follows basic commands without difficulty. BUE-proximal> distal weakness. UE 1+ to 2- at deltoids and 2 -3 distally LE 2-HF to 3/5 at ankles. No sensory findings. Voice week.  Skin: Skin is warm and dry.  Psychiatric:  Flat affect    Results for orders placed during the hospital encounter of 02/05/13 (from the past 24 hour(s))  GLUCOSE, CAPILLARY     Status: Abnormal   Collection Time    02/17/13  3:14 PM      Result Value Range   Glucose-Capillary 109 (*) 70 - 99 mg/dL  BASIC METABOLIC PANEL     Status: Abnormal   Collection Time    02/17/13  5:56 PM      Result Value Range   Sodium 154 (*) 135 - 145 mEq/L   Potassium 3.2 (*) 3.5 - 5.1 mEq/L   Chloride 110  96 - 112 mEq/L   CO2 41 (*) 19 - 32 mEq/L   Glucose, Bld 120 (*) 70 - 99 mg/dL   BUN 80 (*) 6  - 23 mg/dL   Creatinine, Ser 1.61 (*) 0.50 - 1.10 mg/dL   Calcium 9.3  8.4 - 09.6 mg/dL   GFR calc non Af Amer 29 (*) >90 mL/min   GFR calc Af Amer 34 (*) >90 mL/min  GLUCOSE, CAPILLARY     Status: Abnormal   Collection Time    02/17/13  7:18 PM      Result Value Range   Glucose-Capillary 106 (*) 70 - 99 mg/dL  GLUCOSE, CAPILLARY     Status: Abnormal   Collection Time    02/17/13 11:44 PM      Result Value Range   Glucose-Capillary 108 (*) 70 - 99 mg/dL  GLUCOSE, CAPILLARY     Status: Abnormal   Collection Time    02/18/13  3:46 AM      Result Value Range   Glucose-Capillary 109 (*) 70 - 99 mg/dL  CBC     Status: Abnormal   Collection Time    02/18/13  4:00 AM      Result Value Range   WBC 8.2  4.0 - 10.5 K/uL   RBC 3.71 (*) 3.87 - 5.11 MIL/uL   Hemoglobin 10.3 (*) 12.0 - 15.0 g/dL   HCT 04.5 (*) 40.9 -  46.0 %   MCV 84.4  78.0 - 100.0 fL   MCH 27.8  26.0 - 34.0 pg   MCHC 32.9  30.0 - 36.0 g/dL   RDW 40.9 (*) 81.1 - 91.4 %   Platelets 122 (*) 150 - 400 K/uL  PHOSPHORUS     Status: None   Collection Time    02/18/13  4:00 AM      Result Value Range   Phosphorus 4.0  2.3 - 4.6 mg/dL  BASIC METABOLIC PANEL     Status: Abnormal   Collection Time    02/18/13  4:00 AM      Result Value Range   Sodium 150 (*) 135 - 145 mEq/L   Potassium 3.7  3.5 - 5.1 mEq/L   Chloride 107  96 - 112 mEq/L   CO2 38 (*) 19 - 32 mEq/L   Glucose, Bld 116 (*) 70 - 99 mg/dL   BUN 67 (*) 6 - 23 mg/dL   Creatinine, Ser 7.82 (*) 0.50 - 1.10 mg/dL   Calcium 8.9  8.4 - 95.6 mg/dL   GFR calc non Af Amer 32 (*) >90 mL/min   GFR calc Af Amer 37 (*) >90 mL/min  GLUCOSE, CAPILLARY     Status: None   Collection Time    02/18/13  7:47 AM      Result Value Range   Glucose-Capillary 96  70 - 99 mg/dL  GLUCOSE, CAPILLARY     Status: Abnormal   Collection Time    02/18/13 12:19 PM      Result Value Range   Glucose-Capillary 106 (*) 70 - 99 mg/dL   Dg Chest Port 1 View  02/17/2013  *RADIOLOGY REPORT*   Clinical Data: Ventilator dependent respiratory failure and acute renal failure.  PORTABLE CHEST - 1 VIEW  Comparison: 02/16/2013 and prior chest radiographs  Findings: The cardiomediastinal silhouette is unremarkable. An endotracheal tube with tip 5 cm above the carina, right IJ central venous catheter with tip overlying the mid SVC, and NG tube entering the stomach with tip off the field of view again identified. Mild interstitial prominence is unchanged. There is no evidence of pleural effusion or pneumothorax. Little interval change identified from the prior study.  IMPRESSION: Unchanged chest radiograph.   Original Report Authenticated By: Harmon Pier, M.D.     Assessment/Plan: Diagnosis: severe deconditioning, critical illness/steroid myopathy 1. Does the need for close, 24 hr/day medical supervision in concert with the patient's rehab needs make it unreasonable for this patient to be served in a less intensive setting? Yes 2. Co-Morbidities requiring supervision/potential complications: morbid obesity, asthma 3. Due to bladder management, bowel management, safety, skin/wound care, disease management, medication administration, pain management and patient education, does the patient require 24 hr/day rehab nursing? Yes 4. Does the patient require coordinated care of a physician, rehab nurse, PT (1-2 hrs/day, 5 days/week) and OT (1-2 hrs/day, 5 days/week) to address physical and functional deficits in the context of the above medical diagnosis(es)? Yes Addressing deficits in the following areas: balance, endurance, locomotion, strength, transferring, bowel/bladder control, bathing, dressing, feeding, grooming, toileting and psychosocial support 5. Can the patient actively participate in an intensive therapy program of at least 3 hrs of therapy per day at least 5 days per week? Yes 6. The potential for patient to make measurable gains while on inpatient rehab is good 7. Anticipated functional outcomes  upon discharge from inpatient rehab are supervision to minimal assist with PT, supervision to minimal assist with OT, n/a  with SLP. 8. Estimated rehab length of stay to reach the above functional goals is: 2-3 weeks 9. Does the patient have adequate social supports to accommodate these discharge functional goals? Potentially 10. Anticipated D/C setting: Home 11. Anticipated post D/C treatments: HH therapy 12. Overall Rehab/Functional Prognosis: excellent  RECOMMENDATIONS: This patient's condition is appropriate for continued rehabilitative care in the following setting: CIR Patient has agreed to participate in recommended program. Yes Note that insurance prior authorization may be required for reimbursement for recommended care.  Comment:Need to follow up on social supports. Will need assistance upon discharge.  Ranelle Oyster, MD, [place] [facility]     02/18/2013

## 2013-02-18 NOTE — Care Management Note (Signed)
    Page 1 of 2   02/18/2013     2:15:48 PM   CARE MANAGEMENT NOTE 02/18/2013  Patient:  ALAYNA, MABE   Account Number:  [id number]  Date Initiated:  02/06/2013  Documentation initiated by:  [facility]  Subjective/Objective Assessment:   shortness of breath,     Action/Plan:   lives with husband   Anticipated DC Date:  02/08/2013   Anticipated DC Plan:  HOME/SELF CARE      DC Planning Services  CM consult  Medication Assistance      Choice offered to / List presented to:             Status of service:  In process, will continue to follow Medicare Important Message given?   (If response is "NO", the following Medicare IM given date fields will be blank) Date Medicare IM given:   Date Additional Medicare IM given:    Discharge Disposition:    Per UR Regulation:  Reviewed for med. necessity/level of care/duration of stay  If discussed at Long Length of Stay Meetings, dates discussed:    Comments:  Contact:  Sybrina Laning, husband # [phone]  02-18-13 9:55am Avie Arenas, RNBSN [phone] Now extubated - renals improving. -- CIR???  PT saw - recommended CIR - orders written for inhouse rehab.  Talked with patient who is concerned about discharge medications. Prior to coming in had nebullizer, but states she got these through the ED.  has not had to go to pharmacy to pick up. CM will continue to follow for discharge medicaion needs, at this time not clear what she will need and hopefully she will go to CIR prior to discharge.  02-08-13 10:30 am Johny Shears - [phone] Deteriorated last pm - requiring intubation.  02/06/2013 1700 Call to husband to let him know his insurance expired on 01/21/2013. They are provided [employer] with necessary paperwork and he going with them 02/08/2013. Isidoro Donning RN CCM Case Mgmt phone [phone]  02/06/2013 1045 NCM spoke to pt and states she has insurance with [employer]. Gave permisison to call husband for info. Info sent for benefits  check. If pt needs assistance with meds, will use MATCH program and arrange appt with [facility] for follow up post dc.  Isidoro Donning RN CCM Case Mgmt phone [phone]  Mychelle Kendra, husband # [phone]

## 2013-02-18 NOTE — Progress Notes (Signed)
Rehab Admissions Coordinator Note:  Patient was screened by Meryl Dare for appropriateness for an Inpatient Acute Rehab Consult.  Consult has been ordered. Meryl Dare 02/18/2013, 1:07 PM  I can be reached at [phone].

## 2013-02-18 NOTE — Evaluation (Signed)
Physical Therapy Evaluation Patient Details Name: Kathleen Rodriguez MRN: [id number] DOB: 06/05/1966 Today's Date: 02/18/2013 Time: 1030-1055 PT Time Calculation (min): 25 min  PT Assessment / Plan / Recommendation Clinical Impression  Pt adm with flu and VDRF.  Needs skilled PT to maximize I and safety so pt can eventually return home.  Pt with global weakness and poor activity tolerance currently.  Expect pt will make steady progress.  Feel pt can benefit from CIR stay prior to return home.    PT Assessment  Patient needs continued PT services    Follow Up Recommendations  CIR    Does the patient have the potential to tolerate intense rehabilitation      Barriers to Discharge        Equipment Recommendations  Other (comment) (TBA)    Recommendations for Other Services Rehab consult   Frequency Min 3X/week    Precautions / Restrictions Precautions Precautions: Fall Precaution Comments: deconditioned VDRF x 10 days Restrictions Weight Bearing Restrictions: No   Pertinent Vitals/Pain See flow sheet      Mobility  Bed Mobility Bed Mobility: Supine to Sit;Sitting - Scoot to Edge of Bed;Sit to Supine Supine to Sit: 1: +2 Total assist;HOB elevated Supine to Sit: Patient Percentage: 30% Sitting - Scoot to Edge of Bed: 1: +2 Total assist Sitting - Scoot to Edge of Bed: Patient Percentage: 0% Sit to Supine: 1: +2 Total assist Sit to Supine: Patient Percentage: 0% Details for Bed Mobility Assistance: Pt requires assist for all aspects.  Was able to assist with lifting shoulders with [facility] elevated    Exercises     PT Diagnosis: Difficulty walking;Generalized weakness  PT Problem List: Decreased strength;Decreased activity tolerance;Decreased balance;Decreased mobility;Decreased cognition;Decreased knowledge of use of DME;Obesity PT Treatment Interventions: DME instruction;Gait training;Patient/family education;Functional mobility training;Therapeutic activities;Therapeutic  exercise;Balance training   PT Goals Acute Rehab PT Goals PT Goal Formulation: With patient Time For Goal Achievement: 02/25/13 Potential to Achieve Goals: Good Pt will Roll Supine to Right Side: with mod assist PT Goal: Rolling Supine to Right Side - Progress: Goal set today Pt will Roll Supine to Left Side: with mod assist PT Goal: Rolling Supine to Left Side - Progress: Goal set today Pt will go Supine/Side to Sit: with mod assist PT Goal: Supine/Side to Sit - Progress: Goal set today Pt will Sit at Edge of Bed: with supervision;3-5 min PT Goal: Sit at [place] Of Bed - Progress: Goal set today Pt will go Sit to Supine/Side: with mod assist PT Goal: Sit to Supine/Side - Progress: Goal set today Pt will go Sit to Stand: with +2 total assist (pt = 60%) PT Goal: Sit to Stand - Progress: Goal set today Pt will go Stand to Sit: with +2 total assist (pt = 60%) PT Goal: Stand to Sit - Progress: Goal set today Pt will Ambulate: 1 - 15 feet;with +2 total assist;with least restrictive assistive device (Pt=60%) PT Goal: Ambulate - Progress: Goal set today  Visit Information  Last PT Received On: 02/18/13 Assistance Needed: +2    Subjective Data  Subjective: Pt stated she felt a little dizzy sitting EOB. Patient Stated Goal: Go home   Prior Functioning  Home Living Lives With: Family Available Help at Discharge: Family Type of Home: Apartment Home Access: Level entry Home Layout: One level Bathroom Shower/Tub: Engineer, manufacturing systems: Standard Home Adaptive Equipment: Straight cane Prior Function Level of Independence: Independent with assistive device(s) (used straight cane) Driving: No Communication Communication: No difficulties (speaks softly)  Dominant Hand: Right    Cognition  Cognition Arousal/Alertness: Awake/alert Behavior During Therapy: Flat affect Overall Cognitive Status: Impaired/Different from baseline Area of Impairment: Problem solving     Extremity/Trunk Assessment Right Upper Extremity Assessment RUE ROM/Strength/Tone: Deficits RUE ROM/Strength/Tone Deficits: 2/5 RUE Coordination: Deficits RUE Coordination Deficits: due to weakness Left Upper Extremity Assessment LUE ROM/Strength/Tone: Deficits LUE ROM/Strength/Tone Deficits: 2/5 LUE Coordination: Deficits LUE Coordination Deficits: due to weakness Right Lower Extremity Assessment RLE ROM/Strength/Tone: Deficits RLE ROM/Strength/Tone Deficits: grossly 2/5 Left Lower Extremity Assessment LLE ROM/Strength/Tone: Deficits LLE ROM/Strength/Tone Deficits: grossly 2/5 Trunk Assessment Trunk Assessment: Other exceptions Trunk Exceptions: Leans to Lt., likely due to uneven bed; however, pt unable to self correct    Balance Balance Balance Assessed: Yes Static Sitting Balance Static Sitting - Balance Support: Bilateral upper extremity supported;Feet supported Static Sitting - Level of Assistance: 3: Mod assist Static Sitting - Comment/# of Minutes: leaning to Left.  Unable to self correct  End of Session PT - End of Session Equipment Utilized During Treatment: Oxygen Activity Tolerance: Patient limited by fatigue Patient left: in bed;with call bell/phone within reach Nurse Communication: Mobility status;Need for lift equipment  GP     Abhimanyu Cruces 02/18/2013, 12:10 PM  [facility] PT [phone]

## 2013-02-18 NOTE — Progress Notes (Signed)
Patient ID: Kathleen Rodriguez, female   DOB: Jun 25, 1966, 47 y.o.   MRN: [id number] S:pt now extubated and doing well. O:BP 127/65  Pulse 91  Temp(Src) 98.2 F (36.8 C) (Oral)  Resp 20  Ht 5\' 4"  (1.626 m)  Wt 129.5 kg (285 lb 7.9 oz)  BMI 48.98 kg/m2  SpO2 96%  LMP 01/23/2013  Intake/Output Summary (Last 24 hours) at 02/18/13 0842 Last data filed at 02/18/13 0700  Gross per 24 hour  Intake   2270 ml  Output   2970 ml  Net   -700 ml   Intake/Output: I/O last 3 completed shifts: In: 3830 [P.O.:480; I.V.:2130; NG/GT:1220] Out: 4905 [Urine:4755; Stool:150]  Intake/Output this shift:    Weight change: 0.7 kg (1 lb 8.7 oz) Gen:WD WN obese AAF in NAD CVS:no rub Resp:decreased BS at bases VHQ:IONGE, nontender XBM:WUXLKGM edema   Recent Labs Lab 02/12/13 0500 02/13/13 0425 02/14/13 0420 02/15/13 0500 02/16/13 0500 02/17/13 0500 02/17/13 1756 02/18/13 0400  NA 139 143 148* 153* 155* 156* 154* 150*  K 5.3* 4.9 3.5 3.3* 4.1 3.4* 3.2* 3.7  CL 98 103 103 105 109 111 110 107  CO2 32 33* 38* 43* 43* 42* 41* 38*  GLUCOSE 122* 121* 121* 116* 107* 112* 120* 116*  BUN 90* 105* 116* 120* 109* 89* 80* 67*  CREATININE 4.10* 4.09* 3.69* 3.33* 2.58* 2.07* 1.95* 1.82*  ALBUMIN 2.9* 2.7* 2.7* 2.7* 2.6* 2.6*  --   --   CALCIUM 7.4* 7.9* 8.7 9.5 9.5 9.8 9.3 8.9  PHOS 9.1* 8.6* 5.2* 4.5 3.4 3.2  --  4.0  AST 72*  --  52*  --   --   --   --   --   ALT 63*  --  58*  --   --   --   --   --    Liver Function Tests:  Recent Labs Lab 02/12/13 0500  02/14/13 0420 02/15/13 0500 02/16/13 0500 02/17/13 0500  AST 72*  --  52*  --   --   --   ALT 63*  --  58*  --   --   --   ALKPHOS 38*  --  32*  --   --   --   BILITOT 0.2*  --  0.2*  --   --   --   PROT 6.7  --  6.2  --   --   --   ALBUMIN 2.9*  < > 2.7* 2.7* 2.6* 2.6*  < > = values in this interval not displayed. No results found for this basename: LIPASE, AMYLASE,  in the last 168 hours No results found for this basename: AMMONIA,  in the  last 168 hours CBC:  Recent Labs Lab 02/14/13 0420 02/15/13 0500 02/16/13 0500 02/17/13 0500 02/18/13 0400  WBC 10.6* 10.2 9.0 9.7 8.2  HGB 10.1* 10.7* 10.4* 10.2* 10.3*  HCT 30.1* 32.2* 31.8* 31.0* 31.3*  MCV 82.0 83.6 84.6 84.7 84.4  PLT 146* 153 128* 131* 122*   Cardiac Enzymes:  Recent Labs Lab 02/11/13 1346  TROPONINI <0.30   CBG:  Recent Labs Lab 02/17/13 1514 02/17/13 1918 02/17/13 2344 02/18/13 0346 02/18/13 0747  GLUCAP 109* 106* 108* 109* 96    Iron Studies: No results found for this basename: IRON, TIBC, TRANSFERRIN, FERRITIN,  in the last 72 hours Studies/Results: Dg Chest Port 1 View  02/17/2013  *RADIOLOGY REPORT*  Clinical Data: Ventilator dependent respiratory failure and acute renal failure.  PORTABLE CHEST - 1  VIEW  Comparison: 02/16/2013 and prior chest radiographs  Findings: The cardiomediastinal silhouette is unremarkable. An endotracheal tube with tip 5 cm above the carina, right IJ central venous catheter with tip overlying the mid SVC, and NG tube entering the stomach with tip off the field of view again identified. Mild interstitial prominence is unchanged. There is no evidence of pleural effusion or pneumothorax. Little interval change identified from the prior study.  IMPRESSION: Unchanged chest radiograph.   Original Report Authenticated By: Harmon Pier, M.D.    . antiseptic oral rinse  15 mL Mouth Rinse QID  . chlorhexidine  15 mL Mouth Rinse BID  . cloNIDine  0.1 mg Oral TID  . diltiazem  240 mg Oral Daily  . famotidine  40 mg Oral Daily  . heparin subcutaneous  5,000 Units Subcutaneous Q8H  . insulin aspart  0-15 Units Subcutaneous Q4H  . ipratropium  0.5 mg Nebulization Q6H  . levalbuterol  0.63 mg Nebulization Q6H  . pantoprazole  40 mg Oral Daily  . potassium chloride  40 mEq Oral Daily  . predniSONE  10 mg Oral Q breakfast    BMET    Component Value Date/Time   NA 150* 02/18/2013 0400   K 3.7 02/18/2013 0400   CL 107 02/18/2013  0400   CO2 38* 02/18/2013 0400   GLUCOSE 116* 02/18/2013 0400   BUN 67* 02/18/2013 0400   CREATININE 1.82* 02/18/2013 0400   CALCIUM 8.9 02/18/2013 0400   GFRNONAA 32* 02/18/2013 0400   GFRAA 37* 02/18/2013 0400   CBC    Component Value Date/Time   WBC 8.2 02/18/2013 0400   RBC 3.71* 02/18/2013 0400   HGB 10.3* 02/18/2013 0400   HCT 31.3* 02/18/2013 0400   PLT 122* 02/18/2013 0400   MCV 84.4 02/18/2013 0400   MCH 27.8 02/18/2013 0400   MCHC 32.9 02/18/2013 0400   RDW 16.2* 02/18/2013 0400   LYMPHSABS 0.6* 02/11/2013 0400   MONOABS 1.3* 02/11/2013 0400   EOSABS 0.0 02/11/2013 0400   BASOSABS 0.0 02/11/2013 0400     Assessment/Plan:  1. AKI- non-oliguric, now improving.  Multifactorial- CIN, NSAIDs, ischemic ATN.  Marked increase in UOP likely in diuretic phase of recovery 2. Hypernatremia- improving with free water.  Now that she is extubated, she is able to drink on her own and should cont to improve 3. Status asthmaticus- per pulm CCM 4. DM- per primary service 5. Dispo- will sign off as we have nothing further to add.  Primary team can cont with current management. Please call with any questions/concerns.   Jazsmin Couse A

## 2013-02-19 LAB — GLUCOSE, CAPILLARY
Glucose-Capillary: 84 mg/dL (ref 70–99)
Glucose-Capillary: 97 mg/dL (ref 70–99)
Glucose-Capillary: 135 mg/dL — ABNORMAL HIGH (ref 70–99)

## 2013-02-19 LAB — BASIC METABOLIC PANEL
CO2: 33 mEq/L — ABNORMAL HIGH (ref 19–32)
Calcium: 9 mg/dL (ref 8.4–10.5)
Creatinine, Ser: 1.73 mg/dL — ABNORMAL HIGH (ref 0.50–1.10)

## 2013-02-19 MED ORDER — ACETAMINOPHEN 325 MG PO TABS: 325.0000 mg | ORAL_TABLET | Freq: Three times a day (TID) | ORAL | Status: DC | PRN

## 2013-02-19 MED ORDER — ACETAMINOPHEN 80 MG PO CHEW: 320.0000 mg | CHEWABLE_TABLET | Freq: Three times a day (TID) | ORAL | Status: DC | PRN

## 2013-02-19 MED ORDER — ACETAMINOPHEN 325 MG PO TABS
650.0000 mg | ORAL_TABLET | Freq: Four times a day (QID) | ORAL | Status: DC | PRN
  Administered 2013-02-19: 650 mg via ORAL
  Filled 2013-02-19: qty 2

## 2013-02-19 NOTE — Discharge Summary (Signed)
Physician Discharge Summary     Patient ID: Kathleen Rodriguez MRN: [id number] DOB/AGE: June 13, 1966 47 y.o.  Admit date: 02/05/2013 Discharge date: 02/19/2013  Admission Diagnoses:    Diagnosis  . Asthma exacerbation  . Severe obesity (BMI >= 40)  . Acute respiratory failure  . Influenza B  . Status asthmaticus  . Moderate protein-energy malnutrition  . Elevated liver enzymes  . Acute renal failure  . Physical deconditioning and morbid obesity    Discharge Diagnoses:    Principal Problem:   Acute respiratory failure Active Problems:   Asthma exacerbation   Severe obesity (BMI >= 40)   Influenza B   Status asthmaticus   Moderate protein-energy malnutrition   Elevated liver enzymes   Acute renal failure   Physical deconditioning   Morbid obesity   Significant Hospital tests/ studies/ interventions and procedures   SIGNIFICANT EVENTS / STUDIES:  4/15-Chest CT negative  4/16-Influenza B positive  4/18 - Renal US- no hydro  4/24 - Echo EF 65-70%, mild LVH, grade 1 DD   LINES / TUBES:  4/17- ETT >>>4/27  4/18 rt ij >>>4/28  4/18 A-line >>4/24   CULTURES:  4/17 trach aspirate >>NEG  4/17-BC >> No growth  4/17 UC >> Negative  4/16 Influenza B pos  H1N1 , Influenza A - neg 4/15-Azithromycin>>>4/17  4/15 Cleocin>>>4/16  Tamiflu 4/17 >> 4/23  4/18 ceftriaxone>>> 4/20  4/18 levofloxacin>>>4/23  4/18 vanc>>>4/20     Brief patient description: 47 yo BF with hx of asthma, arthritis, and obesity admitted on 4/15 with SOB in significant respiratory distress. Patient had a negative CT pulmonary angiogram on 4/15. 4/16 patient came back positive with Influenza B and started on Tamiflu on 4/17. Patient intubated 4/17 in acute respiratory distress.    Hospital Course:   PULMONARY: Admitted in acute Respiratory Failure secondary to status asthmaticus and  Influenza B positive. She was intubated for 10 days. Extubated 4/27. Post extubation, she remained stable and she  will be transferred to inpatient rehab. The prednisolone can be tapered down over the next few days. She will continue with the bronchodilators.  CARDIOVASCULAR  She was found to have a component of fluid overload with +17L net in/out. She had elevated blood pressure. 2D Echo 4/24 EF 65-70%, no WMA, mild LVH. Grade 1D Dysfunction. She was briefly treated with lasix with good response. She is now down to 7.7L net and off lasix. She will continue with scheduled po Clonidine 0.3 mg tid and Cardizem po 60 mg q6hr. Her BP is well controlled currently.   RENAL  She developed Acute renal failure with peaking to 4.29. Possible etiology include ATN from contrast exposure. Renal was consulted. Her creatinine has markedly improved to 1.73 by the time of discharge from the ICU. Volume excess is improving too from +17L to 7.7L. Renal output is good.foley catheter removed.  She had Hypernatremia 156>>154>>150>>146 and hypokalemia 3.4>>3.7>>3.3. She will continue to need potassium repletion and will be on a IV D5W to @50  cc/hr. Close monitoring of BMET is needed.   GASTROINTESTINAL  She had Mild elevation in liver enzymes. Moderate protein malnutrition. Diarrhea with C.diff - negative and epigastric pain. She is now oral diet but she continues to have some diarrhea which is likely causing potassium loss.  She will continue with protonix.    HEMATOLOGIC  No active issues. She is on heparin for DVt prevention   INFECTIOUS  Influenza B pcr positive on admission to ICU. She was treated with a 7 days  course of Tamiflu. Super infection with bacterial less likely. Regardless she was treated with antibiotics for 5 days. She remained afebrile and no leukocytosis. Continue to monitor.   NEUROLOGIC: No active issus currently. Off sedation and fully alert at discharge from ICU.  Discharge Exam: BP 145/84  Pulse 93  Temp(Src) 97.7 F (36.5 C) (Oral)  Resp 23  Ht 5\' 4"  (1.626 m)  Wt 285 lb 7.9 oz (129.5 kg)  BMI 48.98  kg/m2  SpO2 96%  LMP 01/23/2013  PHYSICAL EXAMINATION:  General: Obese Black female, fully alert and interactive. Seated in bed.  Neuro: Sedated rass = 0,  HEENT: increased JVD  Cardiovascular: s1 s 2 RRR no m, no crepitus, 1+edema  Lungs: Distant breath sounds.  Abdomen: No epigastric tenderness. No palpable masses. Normal bowel sounds, no r/g  Musculoskeletal: No obvious deformities.  Skin: Warm to the touch. No rashes   Labs at discharge Lab Results  Component Value Date   CREATININE 1.73* 02/19/2013   BUN 44* 02/19/2013   NA 146* 02/19/2013   K 3.3* 02/19/2013   CL 105 02/19/2013   CO2 33* 02/19/2013   Lab Results  Component Value Date   WBC 8.2 02/18/2013   HGB 10.3* 02/18/2013   HCT 31.3* 02/18/2013   MCV 84.4 02/18/2013   PLT 122* 02/18/2013   Lab Results  Component Value Date   ALT 58* 02/14/2013   AST 52* 02/14/2013   ALKPHOS 32* 02/14/2013   BILITOT 0.2* 02/14/2013   Lab Results  Component Value Date   INR 1.14 02/09/2013    Disposition: Comprehensive Inpatient rehab    Medication List    ASK your doctor about these medications       albuterol 108 (90 BASE) MCG/ACT inhaler  Commonly known as:  PROVENTIL HFA;VENTOLIN HFA  Inhale 2 puffs into the lungs every 6 (six) hours as needed for wheezing.     predniSONE 10 MG tablet  Commonly known as:  DELTASONE  Take 10 mg by mouth daily. Patient took for three days.        Discharged Condition: good  Physician Statement:   The Patient was personally examined, the discharge assessment and plan has been personally reviewed and I agree with ACNP Babcock's assessment and plan. > 30 minutes of time have been dedicated to discharge assessment, planning and discharge instructions.    Signed:  Dow Adolph, MD PGY-1 Internal Medicine Teaching Service Pager: [phone] 02/19/2013, 12:29 PM   She is ready to go to IP rehab when bed available. Transfer to med-surg awaiting Rehab   Billy Fischer, MD ; [facility] [phone].  After 5:30 PM or weekends, call [phone]

## 2013-02-19 NOTE — Progress Notes (Signed)
Physical Therapy Treatment Patient Details Name: Kathleen Rodriguez MRN: [id number] DOB: 09-Jul-1966 Today's Date: 02/19/2013 Time: 7846-9629 PT Time Calculation (min): 21 min  PT Assessment / Plan / Recommendation Comments on Treatment Session  pt rpesents with VDRF and PNA.  pt very weak and deconditioned, but demos improvement from session yesterday.      Follow Up Recommendations  CIR     Does the patient have the potential to tolerate intense rehabilitation     Barriers to Discharge        Equipment Recommendations   (TBD)    Recommendations for Other Services Rehab consult  Frequency Min 3X/week   Plan Discharge plan remains appropriate;Frequency remains appropriate    Precautions / Restrictions Precautions Precautions: Fall Precaution Comments: deconditioned VDRF x 10 days Restrictions Weight Bearing Restrictions: No   Pertinent Vitals/Pain Does not indicate pain.      Mobility  Bed Mobility Bed Mobility: Supine to Sit;Sitting - Scoot to Edge of Bed Supine to Sit: 1: +2 Total assist Supine to Sit: Patient Percentage: 50% Sitting - Scoot to Edge of Bed: 1: +2 Total assist Sitting - Scoot to Edge of Bed: Patient Percentage: 20% Details for Bed Mobility Assistance: pt requires cues for safe technique and encouragement.   Transfers Transfers: Sit to Stand;Stand to Sit;Stand Pivot Transfers Sit to Stand: 1: +2 Total assist;With upper extremity assist;From bed Sit to Stand: Patient Percentage: 40% Stand to Sit: 1: +2 Total assist;With upper extremity assist;To bed;To chair/3-in-1 Stand to Sit: Patient Percentage: 20% Stand Pivot Transfers: 1: +2 Total assist Stand Pivot Transfers: Patient Percentage: 40% Details for Transfer Assistance: Utilized pad under pt's hips for lifting A and directing movement of SPT.  cues for encouragement and safe technique.  pt stood once for ~10sec and then needed to sit 2/2 LE fatigue.  On 2nd stand completed SPT.    Ambulation/Gait Ambulation/Gait Assistance: Not tested (comment) Stairs: No Wheelchair Mobility Wheelchair Mobility: No    Exercises     PT Diagnosis:    PT Problem List:   PT Treatment Interventions:     PT Goals Acute Rehab PT Goals Time For Goal Achievement: 02/25/13 Potential to Achieve Goals: Good PT Goal: Supine/Side to Sit - Progress: Progressing toward goal PT Goal: Sit at Edge Of Bed - Progress: Progressing toward goal PT Goal: Sit to Stand - Progress: Progressing toward goal PT Goal: Stand to Sit - Progress: Progressing toward goal  Visit Information  Last PT Received On: 02/19/13 Assistance Needed: +2    Subjective Data  Subjective: "Let's do this."   Cognition  Cognition Arousal/Alertness: Awake/alert Behavior During Therapy: WFL for tasks assessed/performed Overall Cognitive Status: Impaired/Different from baseline Area of Impairment: Problem solving Problem Solving: Slow processing    Balance  Balance Balance Assessed: No  End of Session PT - End of Session Equipment Utilized During Treatment: Oxygen;Gait belt Activity Tolerance: Patient limited by fatigue Patient left: in chair;with call bell/phone within reach;with family/visitor present Nurse Communication: Mobility status;Need for lift equipment   GP     Sunny Schlein, [place] [phone] 02/19/2013, 12:12 PM

## 2013-02-19 NOTE — Progress Notes (Signed)
Pt having frequent urinary urgency however is having minimal output. Pt bladder scanned. >900cc of urine bladder. Foley was placed 800cc of urine immediately returned. Will continue to monitor pts urine output and evaluate pts need for foley.

## 2013-02-19 NOTE — Progress Notes (Signed)
Clinical Social Worker staffed case with [employer].  CSW met with pt at bedside.  CSW introduced self, explained role, and provided support.  CSW reviewed disposition options.  Pt agreeable to SNF as back up.  CSW to continue to follow and assist as needed.    Angelia Mould, MSW, [facility] [phone]

## 2013-02-19 NOTE — Progress Notes (Addendum)
Pt possible future CIR admit-no bed available today. Discussed w/ pt's CM, Sarah.  Lutricia Horsfall, CIR Admissions will follow.  [phone]

## 2013-02-19 NOTE — Progress Notes (Signed)
Report given to [place], on 6700 unit. Patient alert and oriented x3. MAE with moderate weakness noted. Tolerating care well. Will continue to monitor

## 2013-02-19 NOTE — Progress Notes (Addendum)
PULMONARY  / CRITICAL CARE MEDICINE  Name: Kathleen Rodriguez MRN: [id number] DOB: 12-09-1965    ADMISSION DATE:  02/05/2013 CONSULTATION DATE:  02/07/2013  REFERRING MD :  [facility] PRIMARY SERVICE: LCCM  CHIEF COMPLAINT:  SOB  BRIEF PATIENT DESCRIPTION: 47 yo BF with hx of asthma, arthritis, and obesity admitted on 4/15 with SOB in significant respiratory distress. Patient had a negative CT pulmonary angiogram on 4/15. 4/16 patient came back positive with Influenza B and started on Tamiflu on 4/17. Patient intubated 4/17 in acute respiratory distress.  SIGNIFICANT EVENTS / STUDIES:  4/15-Chest CT negative 4/16-Influenza B positive 4/18 - Renal US- no hydro 4/24 - Echo EF 65-70%, mild LVH, grade 1 DD  LINES / TUBES: 4/17- ETT >>>4/27 4/18 rt ij >>>4/28 4/18 A-line >>4/24  CULTURES: 4/17 trach aspirate >>NEG 4/17-BC >> No growth  4/17 UC >> Negative 4/16 Influenza B pos H1N1 , Influenza A - neg  ANTIBIOTICS: 4/15-Azithromycin>>>4/17 4/15 Cleocin>>>4/16 Tamiflu 4/17 >>  4/23 4/18 ceftriaxone>>> 4/20 4/18 levofloxacin>>>4/23 4/18 vanc>>>4/20  SUBJECTIVE:  Reports pain in her tummy.  Urine retention with 900cc on bladder scan.  Recatheterized    VITAL SIGNS: Temp:  [97.8 F (36.6 C)-98.5 F (36.9 C)] 98.3 F (36.8 C) (04/29 0803) Pulse Rate:  [82-96] 96 (04/29 0900) Resp:  [18-24] 24 (04/29 0900) BP: (110-151)/(60-89) 132/60 mmHg (04/29 0900) SpO2:  [89 %-99 %] 93 % (04/29 0900) HEMODYNAMICS:   VENTILATOR SETTINGS:    INTAKE / OUTPUT: Intake/Output     04/28 0701 - 04/29 0700 04/29 0701 - 04/30 0700   P.O.     I.V. (mL/kg) 1091.7 (8.4) 100 (0.8)   NG/GT     Total Intake(mL/kg) 1091.7 (8.4) 100 (0.8)   Urine (mL/kg/hr) 2940 (0.9) 165 (0.5)   Stool     Total Output 2940 165   Net -1848.3 -65        Urine Occurrence 4 x      PHYSICAL EXAMINATION: General:  Obese Black female, fully alert and interactive. Seated in bed. Neuro:  Sedated rass = 0,  HEENT:  increased JVD Cardiovascular:  s1 s 2 RRR no m, no crepitus, 1+edema Lungs:  Distant breath sounds.  Abdomen:   No epigastric tenderness. No palpable masses. Normal bowel sounds, no r/g Musculoskeletal:  No obvious deformities.  Skin:  Warm to the touch. No rashes  LABS:  Recent Labs Lab 02/13/13 0425 02/13/13 0823 02/14/13 0420 02/15/13 0500 02/16/13 0500 02/17/13 0500 02/17/13 1756 02/18/13 0400 02/19/13 0805  HGB 9.8*  --  10.1* 10.7* 10.4* 10.2*  --  10.3*  --   WBC 9.0  --  10.6* 10.2 9.0 9.7  --  8.2  --   PLT 153  --  146* 153 128* 131*  --  122*  --   NA 143  --  148* 153* 155* 156* 154* 150* 146*  K 4.9  --  3.5 3.3* 4.1 3.4* 3.2* 3.7 3.3*  CL 103  --  103 105 109 111 110 107 105  CO2 33*  --  38* 43* 43* 42* 41* 38* 33*  GLUCOSE 121*  --  121* 116* 107* 112* 120* 116* 94  BUN 105*  --  116* 120* 109* 89* 80* 67* 44*  CREATININE 4.09*  --  3.69* 3.33* 2.58* 2.07* 1.95* 1.82* 1.73*  CALCIUM 7.9*  --  8.7 9.5 9.5 9.8 9.3 8.9 9.0  MG 3.3*  --   --   --   --  2.3  --   --   --  PHOS 8.6*  --  5.2* 4.5 3.4 3.2  --  4.0  --   AST  --   --  52*  --   --   --   --   --   --   ALT  --   --  58*  --   --   --   --   --   --   ALKPHOS  --   --  32*  --   --   --   --   --   --   BILITOT  --   --  0.2*  --   --   --   --   --   --   PROT  --   --  6.2  --   --   --   --   --   --   ALBUMIN 2.7*  --  2.7* 2.7* 2.6* 2.6*  --   --   --   PHART  --  7.290*  --   --   --   --   --   --   --   PCO2ART  --  75.2*  --   --   --   --   --   --   --   PO2ART  --  134.0*  --   --   --   --   --   --   --     Recent Labs Lab 02/18/13 0747 02/18/13 1219 02/18/13 1917 02/19/13 0346 02/19/13 0801  GLUCAP 96 106* 130* 84 97    CXR: 4/27  Improved mild interstitial edema.   ASSESSMENT / PLAN: Principal Problem:   Acute respiratory failure Active Problems:   Asthma exacerbation   Severe obesity (BMI >= 40)   Influenza B   Status asthmaticus   Moderate protein-energy  malnutrition   Elevated liver enzymes   Acute renal failure   Physical deconditioning   PULMONARY A: Acute Respiratory Failure Status asthmaticus / Influenza B pos Ventilator dyssynchrony, resolved  Extubated 4/27  P:   Taper steroids - may be related to the epigastric pain  BDErs  q6h Hold trach   CARDIOVASCULAR A: Overload component. Hemodynamically stable.  Hypertension getting controlled.  2D Echo 4/24 EF 65-70%, no WMA, mild LVH. Grade 1D Dysfunction. Holding lasix.  P:  kvo still +18L >>>+7.7L net  Scheduled  per tube Clonidine increased  0.3 mg tid Scheduled per tube Cardizem po 60 mg q6hr    RENAL A: Acute renal failure. Creatinine improving 1.95 >>1.82>>1.73 Volume excess, renal says ARF d/t contrast induced ATN. ANA, C3 /C4, Mpo/pr-3, GBM antibodies >>> negative Hypernatremia 156>>154>>150 Hypokalemia 3.4>>3.7>>3.3 P:   D/c D5W to @50  cc/hr, BMET tomorrow  Replete K  Holding lasix > diuresing well on her own D/c foley  GASTROINTESTINAL A:  Tolerated TF, Mild elevation in liver enzymes. Moderate protein malnutrition. Diarrhea C.diff - negative. Epigastric pain P:  On PPI Cont TF Taper steroids and wean off over the next few days    HEMATOLOGIC A:  DVt prevention P:  sub q hep D/c scd's  INFECTIOUS A:  Influenza B,Super infection?  Off antibiotics 4/23.  Off Tamiflu 4/23 P:   Monitor for fever and leukocytosis Trend CBC OFF ABX  ENDOCRINE A:  No Acute Findings  P:  SSI on systemic steroids Change to tid ac qhs cbg monitoring   NEUROLOGIC A:  Sedated, improved vent synchrony. Off IV sedation drips 4/23.  P:   Off drips On Prn clonazepam reduce dose Off sedation Prn fentanyl  Transfer to med-surg  Continue with PT Out of bed to chair and provide bedside commode Appreciate  CIR help   TODAY'S SUMMARY: 47 yo AAF with status asthmaticus d/t influenza B, ARF. HTN.  Continues to diuerize without lasix, renal function better.  Extubated  4/27.   Transfer to med-surg bed.  To remain on PCCM service.  Continue with PT CIR consulted and okay with inpatient rehab soon  D/C Foley and try bedside commode. May do in and out catheterization   Signed:  Dow Adolph, MD PGY-1 Internal Medicine Teaching Service Pager: [phone] 02/19/2013, 9:40 AM   I have interviewed and examined the patient and reviewed the database. I have formulated the assessment and plan as reflected in the note above with amendments made by me.  Billy Fischer, MD;  PCCM service; Mobile [phone]

## 2013-02-20 LAB — BASIC METABOLIC PANEL
BUN: 29 mg/dL — ABNORMAL HIGH (ref 6–23)
Chloride: 108 mEq/L (ref 96–112)
GFR calc Af Amer: 40 mL/min — ABNORMAL LOW (ref >90)
GFR calc non Af Amer: 35 mL/min — ABNORMAL LOW (ref >90)
Potassium: 2.9 mEq/L — ABNORMAL LOW (ref 3.5–5.1)

## 2013-02-20 LAB — GLUCOSE, CAPILLARY: Glucose-Capillary: 91 mg/dL (ref 70–99)

## 2013-02-20 MED ORDER — FLUTICASONE PROPIONATE 50 MCG/ACT NA SUSP
2.0000 | Freq: Every day | NASAL | Status: DC
  Administered 2013-02-20 – 2013-02-21 (×2): 2 via NASAL
  Filled 2013-02-20: qty 16

## 2013-02-20 MED ORDER — SODIUM CHLORIDE 0.9 % IV SOLN: INTRAVENOUS | Status: DC | PRN

## 2013-02-20 MED ORDER — POTASSIUM CHLORIDE CRYS ER 20 MEQ PO TBCR
40.0000 meq | EXTENDED_RELEASE_TABLET | Freq: Four times a day (QID) | ORAL | Status: AC
  Administered 2013-02-20 (×2): 40 meq via ORAL
  Filled 2013-02-20 (×2): qty 2

## 2013-02-20 MED ORDER — BUDESONIDE-FORMOTEROL FUMARATE 160-4.5 MCG/ACT IN AERO
2.0000 | INHALATION_SPRAY | Freq: Two times a day (BID) | RESPIRATORY_TRACT | Status: DC
  Administered 2013-02-20 – 2013-02-21 (×2): 2 via RESPIRATORY_TRACT
  Filled 2013-02-20: qty 6

## 2013-02-20 MED ORDER — ALUM & MAG HYDROXIDE-SIMETH 200-200-20 MG/5ML PO SUSP
15.0000 mL | Freq: Four times a day (QID) | ORAL | Status: DC | PRN
  Administered 2013-02-20: 15 mL via ORAL
  Filled 2013-02-20 (×2): qty 30

## 2013-02-20 MED ORDER — SALINE SPRAY 0.65 % NA SOLN
1.0000 | Freq: Two times a day (BID) | NASAL | Status: DC
  Administered 2013-02-20 – 2013-02-21 (×3): 1 via NASAL
  Filled 2013-02-20: qty 44

## 2013-02-20 MED ORDER — PREDNISONE 5 MG PO TABS
5.0000 mg | ORAL_TABLET | Freq: Every day | ORAL | Status: DC
  Administered 2013-02-21: 5 mg via ORAL
  Filled 2013-02-20 (×2): qty 1

## 2013-02-20 MED ORDER — ALBUTEROL SULFATE (5 MG/ML) 0.5% IN NEBU: 2.5000 mg | INHALATION_SOLUTION | RESPIRATORY_TRACT | Status: DC | PRN

## 2013-02-20 NOTE — Progress Notes (Signed)
Continue to follow patient to assess for appropriateness for inpatient rehab. Patient will need to demonstrate ability to tolerate intensive therapy before we can consider for CIR. CIR bed not available today. Continue to recommend working on SNF as a backup plan for pt's rehabilitation. For questions, call [phone].

## 2013-02-20 NOTE — Progress Notes (Signed)
I have reviewed and agree with this note.  Cathy Marygrace Sandoval, OTR/L [phone] 02/20/2013   

## 2013-02-20 NOTE — Progress Notes (Signed)
Physical Therapy Treatment Patient Details Name: Kathleen Rodriguez MRN: [id number] DOB: 20-Mar-1966 Today's Date: 02/20/2013 Time: 8657-8469 PT Time Calculation (min): 26 min  PT Assessment / Plan / Recommendation Comments on Treatment Session  Pt much improved from last PT session.      Follow Up Recommendations  CIR     Does the patient have the potential to tolerate intense rehabilitation     Barriers to Discharge        Equipment Recommendations  Other (comment)    Recommendations for Other Services Rehab consult  Frequency Min 3X/week   Plan Discharge plan remains appropriate;Frequency remains appropriate    Precautions / Restrictions Precautions Precautions: Fall Precaution Comments: deconditioned VDRF x 10 days Restrictions Weight Bearing Restrictions: No   Pertinent Vitals/Pain No pain.      Mobility  Bed Mobility Bed Mobility: Sit to Supine;Supine to Sit;Rolling Left;Rolling Right Rolling Right: 4: Min assist Rolling Left: 4: Min assist;With rail Left Sidelying to Sit: Not tested (comment) Supine to Sit: 5: Supervision;HOB elevated Sitting - Scoot to Edge of Bed: 5: Supervision Sit to Supine: 5: Supervision Details for Bed Mobility Assistance: Pt able to manage bilateral LE to transition supine<--> sit.  VCs for rolling technique.   Transfers Transfers: Sit to Stand;Stand to Sit Sit to Stand: 4: Min guard;From bed;With upper extremity assist Sit to Stand: Patient Percentage: 50% Stand to Sit: 4: Min guard Stand to Sit: Patient Percentage: 50% Stand Pivot Transfers: Not tested (comment) Details for Transfer Assistance: VCs for hand placement and safe technique Ambulation/Gait Ambulation/Gait Assistance: 4: Min guard Ambulation Distance (Feet): 100 Feet Assistive device: Rolling walker Ambulation/Gait Assistance Details: Cues for proper distancing from  RW, VCs to increase bilateral step length Gait Pattern: Decreased stride length Stairs: No Wheelchair  Mobility Wheelchair Mobility: No    Exercises General Exercises - Upper Extremity Shoulder Flexion: AROM;10 reps;Seated;Both Shoulder Extension: AROM;10 reps;Both;Seated Shoulder ABduction: AROM;10 reps;Seated;Both Shoulder ADduction: AROM;Both;10 reps;Seated Shoulder Horizontal ABduction: AROM;10 reps;Seated;Both Shoulder Horizontal ADduction: AROM;10 reps;Seated;Both Elbow Flexion: AROM;10 reps;Seated;Both Elbow Extension: AROM;10 reps;Both;Seated Wrist Flexion: AROM;10 reps;Seated;Both Wrist Extension: AROM;10 reps;Seated;Both Other Exercises Other Exercises: Supination/Pronation 10 reps Bil UE   PT Diagnosis:    PT Problem List:   PT Treatment Interventions:     PT Goals Acute Rehab PT Goals PT Goal Formulation: With patient Time For Goal Achievement: 02/25/13 Potential to Achieve Goals: Good Pt will Roll Supine to Right Side: with mod assist PT Goal: Rolling Supine to Right Side - Progress: Met Pt will Roll Supine to Left Side: with mod assist PT Goal: Rolling Supine to Left Side - Progress: Met Pt will go Supine/Side to Sit: with mod assist PT Goal: Supine/Side to Sit - Progress: Progressing toward goal Pt will Sit at Edge of Bed: with supervision;3-5 min PT Goal: Sit at [place] Of Bed - Progress: Progressing toward goal Pt will go Sit to Supine/Side: with modified independence PT Goal: Sit to Supine/Side - Progress: Updated due to goal met Pt will go Sit to Stand: with modified independence PT Goal: Sit to Stand - Progress: Updated due to goal met Pt will go Stand to Sit: with modified independence PT Goal: Stand to Sit - Progress: Updated due to goals met Pt will Ambulate: >150 feet;with modified independence;with least restrictive assistive device PT Goal: Ambulate - Progress: Updated due to goal met  Visit Information  Last PT Received On: 02/20/13 Assistance Needed: +2    Subjective Data  Subjective: I am ready to walk Patient Stated Goal:  Go home    Cognition  Cognition Arousal/Alertness: Awake/alert Behavior During Therapy: WFL for tasks assessed/performed Overall Cognitive Status: Within Functional Limits for tasks assessed    Balance  Balance Balance Assessed: No  End of Session PT - End of Session Equipment Utilized During Treatment: Gait belt Activity Tolerance: Patient tolerated treatment well Patient left: in bed;with call bell/phone within reach;with family/visitor present Nurse Communication: Mobility status;Need for lift equipment   GP     Kathleen Rodriguez 02/20/2013, 5:08 PM Joni Colegrove L. Ariz Terrones DPT [phone]

## 2013-02-20 NOTE — Progress Notes (Signed)
K+ 2.9 this am. MD notified.

## 2013-02-20 NOTE — Progress Notes (Signed)
PULMONARY  / CRITICAL CARE MEDICINE  Name: Kathleen Rodriguez MRN: [id number] DOB: 01/09/1966    ADMISSION DATE:  02/05/2013 CONSULTATION DATE:  02/07/2013  REFERRING MD :  [facility] PRIMARY SERVICE: [facility]  CHIEF COMPLAINT:  SOB  BRIEF PATIENT DESCRIPTION:  47 yo female former smoker admitted with progressive dyspnea leading to respiratory failure from influenza and acute asthma exacerbation.  STUDIES:  4/15-Chest CT negative 16-Feb-2023 - Renal US- no hydro 4/24 - Echo EF 65-70%, mild LVH, grade 1 DD  LINES / TUBES: 4/17- ETT >>>4/27 02/16/2023 rt ij >>>4/28 February 16, 2023 A-line >>4/24  CULTURES: 4/17 trach aspirate >>NEG 4/17-BC >> No growth  4/17 UC >> Negative 4/16 Influenza B pos H1N1 , Influenza A - neg  ANTIBIOTICS: 4/15-Azithromycin>>>4/17 4/15 Cleocin>>>4/16 Tamiflu 4/17 >>  4/23 02-16-23 ceftriaxone>>> 4/20 02/16/2023 levofloxacin>>>4/23 2023-02-16 vanc>>>4/20  SUBJECTIVE:  C/o sinus congestion, and upset stomach.  Breathing better.  VITAL SIGNS: Temp:  [97.7 F (36.5 C)-99.3 F (37.4 C)] 99.3 F (37.4 C) (04/30 0548) Pulse Rate:  [81-103] 103 (04/30 0548) Resp:  [18-23] 18 (04/30 0548) BP: (134-172)/(76-95) 172/76 mmHg (04/30 0548) SpO2:  [90 %-96 %] 90 % (04/30 0548) Weight:  [330 lb 9.6 oz (149.959 kg)] 330 lb 9.6 oz (149.959 kg) (04/29 2115) 2 liters [place] INTAKE / OUTPUT: Intake/Output     04/29 0701 - 04/30 0700 04/30 0701 - 05/01 0700   P.O. 900    I.V. (mL/kg) 550 (3.7)    Total Intake(mL/kg) 1450 (9.7)    Urine (mL/kg/hr) 1820 (0.5)    Total Output 1820     Net -370          Urine Occurrence 1 x      PHYSICAL EXAMINATION: General: No distress Neuro:  Alert, follows commands HEENT:  No sinus tenderness Cardiovascular: regular Lungs: Decreased BS, no wheeze Abdomen: Soft, non tender Musculoskeletal: no edema Skin: No rashes  LABS:  Recent Labs Lab 02/14/13 0420 02/15/13 0500 02/16/13 0500 02/17/13 0500  02/18/13 0400 02/19/13 0805 02/20/13 0725  HGB 10.1* 10.7*  10.4* 10.2*  --  10.3*  --   --   WBC 10.6* 10.2 9.0 9.7  --  8.2  --   --   PLT 146* 153 128* 131*  --  122*  --   --   NA 148* 153* 155* 156*  < > 150* 146* 147*  K 3.5 3.3* 4.1 3.4*  < > 3.7 3.3* 2.9*  CL 103 105 109 111  < > 107 105 108  CO2 38* 43* 43* 42*  < > 38* 33* 31  GLUCOSE 121* 116* 107* 112*  < > 116* 94 92  BUN 116* 120* 109* 89*  < > 67* 44* 29*  CREATININE 3.69* 3.33* 2.58* 2.07*  < > 1.82* 1.73* 1.70*  CALCIUM 8.7 9.5 9.5 9.8  < > 8.9 9.0 8.7  MG  --   --   --  2.3  --   --   --   --   PHOS 5.2* 4.5 3.4 3.2  --  4.0  --   --   AST 52*  --   --   --   --   --   --   --   ALT 58*  --   --   --   --   --   --   --   ALKPHOS 32*  --   --   --   --   --   --   --  BILITOT 0.2*  --   --   --   --   --   --   --   PROT 6.2  --   --   --   --   --   --   --   ALBUMIN 2.7* 2.7* 2.6* 2.6*  --   --   --   --   < > = values in this interval not displayed.  Recent Labs Lab 02/18/13 1917 02/19/13 0346 02/19/13 0801 02/19/13 1138 02/20/13 0801  GLUCAP 130* 84 97 135* 91    Imaging: No results found.   ASSESSMENT / PLAN:  Acute respiratory failure with acute asthma exacerbation 2nd to Influenza B. P: -Wean off prednisone as tolerated -change to inhalers >> start symbicort 4/30 -prn albuterol -will need to assess for home oxygen -will need outpt pulmonary follow up  Allergic rhinitis. P: -add saline nasal spray and flonase 4/30   Elevated blood pressure >> no prior hx of HTN. P: -continue cardizem  Deconditioning related to critical illness with use of steroids/paralytics while on ventilator. P: -PT recommending CIR  Epigastric discomfort >> likely from prednisone P: -continue protonix -maalox prn  CKD. Hypokalemia. P: -f/u and replace electrolytes as needed  -monitor renal fx, urine outpt -goal even fluid balance  DVT prevention. P: -continue SQ heparin  Resolved problems >> acute renal failure, steroid induced hyperglycemia  [name],  MD [facility]monary/Critical Care 02/20/2013, 9:18 AM Pager:  [phone] After 3pm call: [phone]

## 2013-02-20 NOTE — Progress Notes (Signed)
Occupational Therapy Treatment Patient Details Name: Kathleen Rodriguez MRN: [id number] DOB: [birth year] Today's Date: 02/20/2013 Time: 3086-5784 OT Time Calculation (min): 33 min  OT Assessment / Plan / Recommendation Comments on Treatment Session Pt is progressing and was able to toelerate more this session.  Pt was able to complete a sit>stand 3x from bed and recliner.  Pt completed 10 reps of Bil UE exercises.  Pt needed 1 rest break and then proceeded to complete the rest of the exercises    Follow Up Recommendations  CIR;Supervision/Assistance - 24 hour       Equipment Recommendations  None recommended by OT    Recommendations for Other Services Rehab consult  Frequency Min 2X/week   Plan Discharge plan remains appropriate    Precautions / Restrictions Precautions Precautions: Fall Restrictions Weight Bearing Restrictions: No       ADL  Grooming: Performed;Other (comment);Maximal assistance (took down hair and combed it) Where Assessed - Grooming: Unsupported sitting Toilet Transfer: Simulated;+2 Total assistance Toilet Transfer: Patient Percentage: 50% Toilet Transfer Method: Sit to Barista:  (bed>recliner) Equipment Used: Gait belt Transfers/Ambulation Related to ADLs: Pt was able to transfer from the EOB>recliner total A +2 (pt=50%) ADL Comments: Completed hair care during session of taking down braids and combing hair.  Pt was unable to take down hair.  Pt was able to comb front of hair but needed A combing the back of her hair. Pt needed S for Bil UE exercises all joints while seated in recliner     OT Goals ADL Goals ADL Goal: Grooming - Progress: Progressing toward goals ADL Goal: Toilet Transfer - Progress: Progressing toward goals ADL Goal: Additional Goal #1 - Progress: Progressing toward goals ADL Goal: Additional Goal #2 - Progress: Met  Visit Information  Last OT Received On: 02/20/13 Assistance Needed: +2    Subjective Data   Subjective: When I sit on the EOB for a long time I begin to feel dizzy(when asked about getting on the EOB)      Cognition  Cognition Arousal/Alertness: Awake/alert Behavior During Therapy: WFL for tasks assessed/performed Overall Cognitive Status: Within Functional Limits for tasks assessed    Mobility  Bed Mobility Bed Mobility: Left Sidelying to Sit;Rolling Left;Sitting - Scoot to Edge of Bed Rolling Left: 4: Min assist;With rail Left Sidelying to Sit: 4: Min assist;HOB flat;With rails Sitting - Scoot to Edge of Bed: 4: Min guard Details for Bed Mobility Assistance: Pt required cues for sequencing bed mobility steps Transfers Transfers: Sit to Stand;Stand to Sit Sit to Stand: 1: +2 Total assist;With upper extremity assist;From bed;From chair/3-in-1 Sit to Stand: Patient Percentage: 50% Stand to Sit: 1: +2 Total assist;With upper extremity assist;To chair/3-in-1;With armrests Stand to Sit: Patient Percentage: 50% Details for Transfer Assistance: Pt raised from bed by holding onto upperarm of OTR/L and OTAS and pushing up from armrest on recliner.  Pt was able to stand for 10sec 3rd attempt.Pt complanied of her leg feeling like metal    Exercises  General Exercises - Upper Extremity Shoulder Flexion: AROM;10 reps;Seated;Both Shoulder Extension: AROM;10 reps;Both;Seated Shoulder ABduction: AROM;10 reps;Seated;Both Shoulder ADduction: AROM;Both;10 reps;Seated Shoulder Horizontal ABduction: AROM;10 reps;Seated;Both Shoulder Horizontal ADduction: AROM;10 reps;Seated;Both Elbow Flexion: AROM;10 reps;Seated;Both Elbow Extension: AROM;10 reps;Both;Seated Wrist Flexion: AROM;10 reps;Seated;Both Wrist Extension: AROM;10 reps;Seated;Both Other Exercises Other Exercises: Supination/Pronation 10 reps Bil UE   Balance Balance Balance Assessed: No   End of Session OT - End of Session Equipment Utilized During Treatment: Gait belt Activity Tolerance: Patient tolerated treatment  well Patient left: in chair;with call bell/phone within reach Nurse Communication: Mobility status (NT: +2 stand turn)       Kathleen Rodriguez, Kathleen Rodriguez 02/20/2013, 4:34 PM

## 2013-02-21 LAB — BASIC METABOLIC PANEL
BUN: 19 mg/dL (ref 6–23)
Chloride: 109 mEq/L (ref 96–112)
Creatinine, Ser: 1.65 mg/dL — ABNORMAL HIGH (ref 0.50–1.10)
GFR calc Af Amer: 42 mL/min — ABNORMAL LOW (ref >90)
GFR calc non Af Amer: 36 mL/min — ABNORMAL LOW (ref >90)
Potassium: 3.1 mEq/L — ABNORMAL LOW (ref 3.5–5.1)

## 2013-02-21 LAB — MAGNESIUM: Magnesium: 1.7 mg/dL (ref 1.5–2.5)

## 2013-02-21 LAB — CBC
Hemoglobin: 10.9 g/dL — ABNORMAL LOW (ref 12.0–15.0)
MCH: 27.4 pg (ref 26.0–34.0)
MCHC: 33.5 g/dL (ref 30.0–36.0)
Platelets: 139 10*3/uL — ABNORMAL LOW (ref 150–400)

## 2013-02-21 MED ORDER — PREDNISONE 5 MG PO TABS: ORAL_TABLET | ORAL | Status: DC

## 2013-02-21 MED ORDER — BUDESONIDE-FORMOTEROL FUMARATE 160-4.5 MCG/ACT IN AERO: 2.0000 | INHALATION_SPRAY | Freq: Two times a day (BID) | RESPIRATORY_TRACT | Status: DC

## 2013-02-21 MED ORDER — FLUTICASONE PROPIONATE 50 MCG/ACT NA SUSP: 2.0000 | Freq: Every day | NASAL | Status: DC

## 2013-02-21 MED ORDER — DILTIAZEM HCL ER COATED BEADS 240 MG PO CP24: 240.0000 mg | ORAL_CAPSULE | Freq: Every day | ORAL | Status: DC

## 2013-02-21 NOTE — Progress Notes (Signed)
02/21/2013 12:06 PM This is a late entry note from this morning.  I was called from [id number] by the NT to come to the room.  When I entered the room, the patient was sitting on her bottom on the floor facing her recliner and next to her bed.  The NT stated that he was walking in the hallway when the patient called out to him.  When he entered the room he found her exactly as previously stated.  The patient stated that the wanted to practice standing up from the chair and didn't realize she was so weak and just slid down onto the ground and landed on her bottom.  Pt denies hitting her head or having any pain whatsoever.  The patient stated that she did not feel as if she could stand with assistance to get back into bed, so we had to use the [place] lift to get her out of the floor and into the bed, which she seemed to tolerate well.  The post fall algorithm was initiated.  A safety huddle was conducted at the bedside.  Pt had red socks on at the time of the fall but no yellow arm band.  A yellow arm band was applied.  Pt's fall risk at the time of the fall was last documented as a 7, fall risk is now elevated from moderate to high.  The falls prevention safety plan was reviewed with the patient, as well as her fall risk and necessary interventions, to which she verbalized understanding and signed the form. I reiterated to the patient the importance of calling before trying to get OOB to which she verbalized understanding.  Bed alarm turned on.  The patient's RN notified Dr. Craige Cotta of the patient's fall shortly therafter, to which no orders were received.  Will continue to monitor the patient. Eunice Blase

## 2013-02-21 NOTE — Progress Notes (Signed)
Met with patient at bedside to discuss plans for her rehabilitation. Pt stated that she would like to go home with [facility]. Noted pt ambulated 100' with min guard yesterday. Pt has insurance coverage starting today but it is not likely insurance would approve CIR at her present level of mobility. Have discussed above with pt's CSW. Will sign off now. Call for questions: [phone]

## 2013-02-21 NOTE — Discharge Summary (Signed)
Physician Discharge Summary  Patient ID: Kathleen Rodriguez MRN: [id number] DOB/AGE: 48-18-1967 48 y.o.  Admit date: 02/05/2013 Discharge date: 02/21/2013    Discharge Diagnoses:  Principal Problem:   Acute respiratory failure Active Problems:   Asthma exacerbation   Severe obesity (BMI >= 40)   Influenza B   Status asthmaticus   Moderate protein-energy malnutrition   Elevated liver enzymes   Acute renal failure   Physical deconditioning   Morbid obesity    Brief Summary: Kathleen Rodriguez is a 47 y.o. y/o female with a PMH of asthma, CKD, obesity admitted 4/16 with acute respiratory failure r/t flu and acute asthma exacerbation.  Required intubation 4/17- 4/27.  Course c/b acute renal failure, steroid induced hyperglycemia, HTN and deconditioning.    STUDIES:  4/15-Chest CT negative  4/18 - Renal US- no hydro  4/24 - Echo EF 65-70%, mild LVH, grade 1 DD   LINES / TUBES:  4/17- ETT >>>4/27  4/18 rt ij >>>4/28  4/18 A-line >>4/24   CULTURES:  4/17 trach aspirate >>NEG  4/17-BC >> No growth  4/17 UC >> Negative  4/16 Influenza B>>> POS H1N1 , Influenza A - neg   ANTIBIOTICS:  4/15-Azithromycin>>>4/17  4/15 Cleocin>>>4/16  Tamiflu 4/17 >> 4/23  4/18 ceftriaxone>>> 4/20  4/18 levofloxacin>>>4/23  4/18 vanc>>>4/20                                                                    Hospital Summary by Discharge Diagnosis  Acute respiratory failure with acute asthma exacerbation 2nd to Influenza B. Intubated 4/17-4/27.  Treated with high dose steroids, abx, tamiflu, BD's.  Did require high levels of sedation and paralytics while on vent to maintain oxygenation and vent synchrony. Had prolonged vent wean but did not require trach. Now doing well from resp standpoint, on RA.  Wean prednisone to off slowly.  Pt started on symbicort.  No need for home O2.  Will cont to f/u as outpt.   Elevated blood pressure >> no prior hx of HTN. Tolerating cardizem PO with improved BP.  2D echo as  above. Was significantly volume overloaded at one time this admit but improved with diuresis. Will f/u as outpt.  She will need a PCP but is concerned about finding one right now r/t switching to her husband's insurance.   Deconditioning related to critical illness with use of steroids/paralytics while on ventilator. Required significant amounts of sedation/ paralytics for vent synchrony, now significant deconditioning.  Previously independent. Followed closely by PT/OT.  They have recommended CIR but no beds are available.  Pt cont to improve daily with PT and wants to go home.  Will arrange max home support with home health PT/nursing.   Acute on chronic renal failure - likely contrast induced with underlying CKD.  Followed closely by renal.  Now likely near baseline Scr.   Influenza B  Abx as above   Filed Vitals:   02/21/13 0935 02/21/13 1005 02/21/13 1105 02/21/13 1205  BP: 140/85 156/80 147/91 154/84  Pulse: 97 92 104 87  Temp: 98.1 F (36.7 C) 98.2 F (36.8 C) 98 F (36.7 C) 98.2 F (36.8 C)  TempSrc: Oral Oral Oral Oral  Resp: 18 18 18 18   Height:      Weight:  SpO2: 95% 97% 97% 93%     Discharge Labs  BMET  Recent Labs Lab 02/15/13 0500 02/16/13 0500 02/17/13 0500 02/17/13 1756 02/18/13 0400 02/19/13 0805 02/20/13 0725 02/21/13 0850  NA 153* 155* 156* 154* 150* 146* 147* 146*  K 3.3* 4.1 3.4* 3.2* 3.7 3.3* 2.9* 3.1*  CL 105 109 111 110 107 105 108 109  CO2 43* 43* 42* 41* 38* 33* 31 29  GLUCOSE 116* 107* 112* 120* 116* 94 92 134*  BUN 120* 109* 89* 80* 67* 44* 29* 19  CREATININE 3.33* 2.58* 2.07* 1.95* 1.82* 1.73* 1.70* 1.65*  CALCIUM 9.5 9.5 9.8 9.3 8.9 9.0 8.7 8.8  MG  --   --  2.3  --   --   --   --  1.7  PHOS 4.5 3.4 3.2  --  4.0  --   --   --      CBC   Recent Labs Lab 02/17/13 0500 02/18/13 0400 02/21/13 0850  HGB 10.2* 10.3* 10.9*  HCT 31.0* 31.3* 32.5*  WBC 9.7 8.2 9.1  PLT 131* 122* 139*   Anti-Coagulation No results found  for this basename: INR,  in the last 168 hours        Follow-up Information   Follow up with SOOD,VINEET, MD On 03/20/2013. (8:45am )    Contact information:   [address] [place] [address] [phone]          Medication List    TAKE these medications       albuterol 108 (90 BASE) MCG/ACT inhaler  Commonly known as:  PROVENTIL HFA;VENTOLIN HFA  Inhale 2 puffs into the lungs every 6 (six) hours as needed for wheezing.     budesonide-formoterol 160-4.5 MCG/ACT inhaler  Commonly known as:  SYMBICORT  Inhale 2 puffs into the lungs 2 (two) times daily.     diltiazem 240 MG 24 hr capsule  Commonly known as:  CARDIZEM CD  Take 1 capsule (240 mg total) by mouth daily.     fluticasone 50 MCG/ACT nasal spray  Commonly known as:  FLONASE  Place 2 sprays into the nose daily.     predniSONE 5 MG tablet  Commonly known as:  DELTASONE  5mg  po daily x 5 days then stop          Disposition: Home with home health  Discharged Condition: Kathleen Rodriguez has met maximum benefit of inpatient care and is medically stable and cleared for discharge.  Patient is pending follow up as above.      Time spent on disposition:  Greater than 35 minutes.   SignedDanford Bad, NP 02/21/2013  12:53 PM Pager: [phone] or [phone]  *Care during the described time interval was provided by me and/or other providers on the critical care team. I have reviewed this patient's available data, including medical history, events of note, physical examination and test results as part of my evaluation.   Levy Pupa, MD, PhD 02/21/2013, 1:14 PM [facility] [phone] or if no answer [phone]

## 2013-02-21 NOTE — Progress Notes (Signed)
In to speak with pt. About home health services.  Pt. Is interested in having home health services.  Contacted [facility] for [facility] RN, HH PT, and HH aide.  Noted DME orders, TC to [place], with [facility], to give referral for Rolling walker, and 3-N-1.  Pt. To dc home today with spouse.  Tera Mater, RN, BSN NCM [phone]

## 2013-02-25 ENCOUNTER — Telehealth: Payer: Self-pay | Admitting: Pulmonary Disease

## 2013-02-25 DIAGNOSIS — J45901 Unspecified asthma with (acute) exacerbation: Secondary | ICD-10-CM

## 2013-02-25 MED ORDER — ALBUTEROL SULFATE (2.5 MG/3ML) 0.083% IN NEBU: 2.5000 mg | INHALATION_SOLUTION | Freq: Four times a day (QID) | RESPIRATORY_TRACT | Status: DC | PRN

## 2013-02-25 NOTE — Telephone Encounter (Signed)
Spoke to pt. States that she already received her PNA vaccine while in the hospital. She is aware that we do not have any flu shots available. An order will be placed with [facility] for a nebulizer machine and albuterol as well.

## 2013-02-25 NOTE — Telephone Encounter (Signed)
Pt called back again. She says she needs a nebulizor "immediately" due to her asthma exacerbation- needs order faxed to [facility] "asap" and needs to know about the flu shot as well. Pt thought she would have heard from nurse by now- she is very anxious. [phone]. Kathleen Rodriguez

## 2013-02-25 NOTE — Telephone Encounter (Signed)
Pt can come in to have pneumonia and flu shot (if available) prior to HFU visit.  Please send order to [facility] to arrange for home nebulizer.  Please also order albuterol 2.5 mg nebulized q6h prn, dispense 120 vials with 2 refills.

## 2013-02-25 NOTE — Telephone Encounter (Signed)
I spoke with pt and advised her I see no documentation in her chart for flu/PMA vaccine. Also she is requesting an order/RX for a neb machine. I see no rx's for neb medications on her list. Pt states she does not want to wait until 03/20/13 to have this discussed with VS at her HFU. Please advise VS thanks

## 2013-02-26 ENCOUNTER — Telehealth: Payer: Self-pay | Admitting: Pulmonary Disease

## 2013-02-26 NOTE — Telephone Encounter (Signed)
Pt has not been seen here in our office. VS saw the pt in the hospital. Has a pending OV on 03/20/13 at 9am with VS.  VS - please advise if you would like to give this pt a temporary handicap placard before her appointment. Thanks.

## 2013-02-26 NOTE — Telephone Encounter (Signed)
Pt called back again re: same. Kathleen W Perdue  °

## 2013-02-26 NOTE — Telephone Encounter (Signed)
Form filled out and will call pt tomorrow

## 2013-02-26 NOTE — Telephone Encounter (Signed)
Called and spoke with patient and advised her as to the status of her nebulizer. Advised patient that I had contacted our liaison and she now has the order and will process this ASAP. Patient is aware that if she hasn't heard from [facility] to contact us back. Rhonda J Cobb

## 2013-02-26 NOTE — Telephone Encounter (Signed)
Order was placed under the "other order" tab. Apparently this process is not flowing to our liaison. I have called Melissa with [facility] and also sent her a staff message to please try to arrange this ASAP as order was placed on 02/25/13. Rhonda J Cobb

## 2013-02-26 NOTE — Telephone Encounter (Signed)
Okay to give temporary handicap placard.  Please emphasize to her that this will likely only be for short-term, and will need to re-assess at next visit.

## 2013-02-27 ENCOUNTER — Telehealth: Payer: Self-pay | Admitting: Pulmonary Disease

## 2013-02-27 MED ORDER — ALBUTEROL SULFATE (2.5 MG/3ML) 0.083% IN NEBU: 2.5000 mg | INHALATION_SOLUTION | Freq: Four times a day (QID) | RESPIRATORY_TRACT | Status: DC | PRN

## 2013-02-27 NOTE — Telephone Encounter (Signed)
Pt aware form placed upfront for pick up. Nothing further was needed

## 2013-02-27 NOTE — Telephone Encounter (Signed)
I spoke with pt and is aware have sent RX to pharmacy/ nothing further was needed

## 2013-03-04 ENCOUNTER — Telehealth: Payer: Self-pay | Admitting: Pulmonary Disease

## 2013-03-14 ENCOUNTER — Telehealth: Payer: Self-pay | Admitting: Pulmonary Disease

## 2013-03-14 NOTE — Telephone Encounter (Signed)
Okay to send order. 

## 2013-03-14 NOTE — Telephone Encounter (Signed)
Called and spoke with susan from [facility]  And she is aware that OK per VS for the order for the social worker.  [name] is aware and nothing further is needed.

## 2013-03-14 NOTE — Telephone Encounter (Signed)
[name] with [facility] is requesting an order for a social worker to help the pt with her disability paperwork. Pt has a HFU set on 03-20-13. Please advise. Carron Curie, CMA

## 2013-03-20 ENCOUNTER — Encounter: Payer: Self-pay | Admitting: Pulmonary Disease

## 2013-03-20 ENCOUNTER — Ambulatory Visit (INDEPENDENT_AMBULATORY_CARE_PROVIDER_SITE_OTHER): Payer: 59 | Admitting: Pulmonary Disease

## 2013-03-20 VITALS — BP 152/90 | HR 84 | Temp 98.1°F | Ht 65.5 in | Wt 257.0 lb

## 2013-03-20 DIAGNOSIS — R0683 Snoring: Secondary | ICD-10-CM

## 2013-03-20 DIAGNOSIS — R0609 Other forms of dyspnea: Secondary | ICD-10-CM

## 2013-03-20 DIAGNOSIS — G4733 Obstructive sleep apnea (adult) (pediatric): Secondary | ICD-10-CM | POA: Insufficient documentation

## 2013-03-20 DIAGNOSIS — R0989 Other specified symptoms and signs involving the circulatory and respiratory systems: Secondary | ICD-10-CM

## 2013-03-20 DIAGNOSIS — R5381 Other malaise: Secondary | ICD-10-CM

## 2013-03-20 MED ORDER — BUDESONIDE-FORMOTEROL FUMARATE 160-4.5 MCG/ACT IN AERO: 2.0000 | INHALATION_SPRAY | Freq: Two times a day (BID) | RESPIRATORY_TRACT | Status: DC

## 2013-03-20 MED ORDER — ALBUTEROL SULFATE HFA 108 (90 BASE) MCG/ACT IN AERS: 2.0000 | INHALATION_SPRAY | Freq: Four times a day (QID) | RESPIRATORY_TRACT | Status: DC | PRN

## 2013-03-20 NOTE — Assessment & Plan Note (Signed)
Reviewed how her weight is contributing to her health problems.

## 2013-03-20 NOTE — Patient Instructions (Signed)
Symbicort 2 puff twice per day, and rinse mouth after using Proair two puffs as needed for cough, wheeze, or chest congestion Will schedule breathing test (PFT) Will schedule sleep study Will get lab results from primary care office Follow up in 8 weeks

## 2013-03-20 NOTE — Assessment & Plan Note (Signed)
She reports snoring, sleep disruption, and daytime sleepiness.  She has history of hypertension.  She was noted to have hypercapnia during recent hospital admission.  I am concerned she could have sleep disordered breathing.  To further assess will arrange for in lab sleep study.

## 2013-03-20 NOTE — Assessment & Plan Note (Signed)
Reviewed the proper use of her inhalers.  Will arrange for pulmonary function testing to further assess.

## 2013-03-20 NOTE — Progress Notes (Signed)
Chief Complaint  Patient presents with  . Hospitalization Follow-up    Breathing is unchanged. Reports DOE, dry cough. Denies chest pain, chest tightness or wheezing.    History of Present Illness: Kathleen Rodriguez is a 47 y.o. female former smoker with asthma, snoring, and recent admission for influenza pneumonia.  She still feels weak, and gets winded with activity.  She still gets chest tightness and wheeze.  She is using albuterol and symbicort once daily in the morning.  She did not know she could use these more often.  She denies fever, chills, chest pain, or skin rashes.  She was told by her PCP that some of her blood tests could go along with diagnosis of lupus.  She is scheduled for follow up with PCP.  She remains on cardizem for her blood pressure, and is due for cardiology follow up.  She has trouble with her sleep.  She snores, and stops breathing while asleep.  She is tired all day, and can fall asleep easily when sitting quiet.   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  Kathleen Rodriguez  has past surgical history that includes Tubal ligation and Cesarean section.  Prior to Admission medications   Medication Sig Start Date End Date Taking? Authorizing Provider  albuterol (PROVENTIL) (2.5 MG/3ML) 0.083% nebulizer solution Take 3 mLs (2.5 mg total) by nebulization every 6 (six) hours as needed for wheezing. DX 493.92 02/27/13  Yes Chesley Mires, MD  budesonide-formoterol (SYMBICORT) 160-4.5 MCG/ACT inhaler Inhale 2 puffs into the lungs 2 (two) times daily. 02/21/13  Yes Marijean Heath, NP  diltiazem (CARDIZEM CD) 240 MG 24 hr capsule Take 1 capsule (240 mg total) by mouth daily. 02/21/13  Yes Marijean Heath, NP  fluticasone (FLONASE) 50 MCG/ACT nasal spray Place 2 sprays into the nose daily. 02/21/13  Yes Marijean Heath, NP  albuterol (PROVENTIL HFA;VENTOLIN HFA) 108 (90 BASE) MCG/ACT inhaler Inhale 2 puffs into the lungs every 6 (six) hours as needed for wheezing.    Historical Provider, MD    Allergies  Allergen Reactions  . Other Hives and Itching    Ketchup  . Tomato Hives and Itching     Physical Exam:  General - No distress ENT - No sinus tenderness, MP 3, no oral exudate, no LAN Cardiac - s1s2 regular, no murmur Chest - No wheeze/rales/dullness Back - No focal tenderness Abd - Soft, non-tender Ext - No edema Neuro - Normal strength Skin - No rashes Psych - normal mood, and behavior   Assessment/Plan:  Chesley Mires, MD [place] [facility]/Critical [facility]/Sleep Pager:  [phone]

## 2013-03-21 ENCOUNTER — Telehealth: Payer: Self-pay | Admitting: Pulmonary Disease

## 2013-03-21 NOTE — Telephone Encounter (Signed)
Unfortunately no samples available at this time  Spoke with pt and notified her of this and she states nothing further needed Will pick up proair from her pharm tomorrow

## 2013-03-22 ENCOUNTER — Telehealth: Payer: Self-pay | Admitting: Pulmonary Disease

## 2013-03-22 ENCOUNTER — Emergency Department (HOSPITAL_BASED_OUTPATIENT_CLINIC_OR_DEPARTMENT_OTHER)
Admission: EM | Admit: 2013-03-22 | Discharge: 2013-03-22 | Disposition: A | Discharge status: Home / Self Care | Payer: Managed Care, Other (non HMO) | Attending: Emergency Medicine | Admitting: Emergency Medicine

## 2013-03-22 ENCOUNTER — Encounter (HOSPITAL_BASED_OUTPATIENT_CLINIC_OR_DEPARTMENT_OTHER): Payer: Self-pay | Admitting: *Deleted

## 2013-03-22 ENCOUNTER — Emergency Department (HOSPITAL_BASED_OUTPATIENT_CLINIC_OR_DEPARTMENT_OTHER): Payer: Managed Care, Other (non HMO)

## 2013-03-22 DIAGNOSIS — R0683 Snoring: Secondary | ICD-10-CM

## 2013-03-22 DIAGNOSIS — Z8669 Personal history of other diseases of the nervous system and sense organs: Secondary | ICD-10-CM | POA: Insufficient documentation

## 2013-03-22 DIAGNOSIS — I10 Essential (primary) hypertension: Secondary | ICD-10-CM | POA: Insufficient documentation

## 2013-03-22 DIAGNOSIS — IMO0002 Reserved for concepts with insufficient information to code with codable children: Secondary | ICD-10-CM | POA: Insufficient documentation

## 2013-03-22 DIAGNOSIS — Z79899 Other long term (current) drug therapy: Secondary | ICD-10-CM | POA: Insufficient documentation

## 2013-03-22 DIAGNOSIS — Z8739 Personal history of other diseases of the musculoskeletal system and connective tissue: Secondary | ICD-10-CM | POA: Insufficient documentation

## 2013-03-22 DIAGNOSIS — J4541 Moderate persistent asthma with (acute) exacerbation: Secondary | ICD-10-CM

## 2013-03-22 DIAGNOSIS — J9600 Acute respiratory failure, unspecified whether with hypoxia or hypercapnia: Secondary | ICD-10-CM | POA: Insufficient documentation

## 2013-03-22 DIAGNOSIS — R51 Headache: Secondary | ICD-10-CM | POA: Insufficient documentation

## 2013-03-22 DIAGNOSIS — F411 Generalized anxiety disorder: Secondary | ICD-10-CM | POA: Insufficient documentation

## 2013-03-22 DIAGNOSIS — R0789 Other chest pain: Secondary | ICD-10-CM | POA: Insufficient documentation

## 2013-03-22 DIAGNOSIS — J45901 Unspecified asthma with (acute) exacerbation: Secondary | ICD-10-CM | POA: Insufficient documentation

## 2013-03-22 DIAGNOSIS — Z87891 Personal history of nicotine dependence: Secondary | ICD-10-CM | POA: Insufficient documentation

## 2013-03-22 LAB — CBC WITH DIFFERENTIAL/PLATELET
Basophils Absolute: 0 10*3/uL (ref 0.0–0.1)
Basophils Relative: 1 % (ref 0–1)
Eosinophils Absolute: 0.3 10*3/uL (ref 0.0–0.7)
Eosinophils Relative: 4 % (ref 0–5)
HCT: 37.6 % (ref 36.0–46.0)
MCHC: 34.3 g/dL (ref 30.0–36.0)
MCV: 82.8 fL (ref 78.0–100.0)
Monocytes Absolute: 0.6 10*3/uL (ref 0.1–1.0)
Neutro Abs: 3.5 10*3/uL (ref 1.7–7.7)
RDW: 15.8 % — ABNORMAL HIGH (ref 11.5–15.5)

## 2013-03-22 LAB — COMPREHENSIVE METABOLIC PANEL
AST: 17 U/L (ref 0–37)
Albumin: 3.3 g/dL — ABNORMAL LOW (ref 3.5–5.2)
Calcium: 9.2 mg/dL (ref 8.4–10.5)
Creatinine, Ser: 0.9 mg/dL (ref 0.50–1.10)

## 2013-03-22 LAB — PRO B NATRIURETIC PEPTIDE: Pro B Natriuretic peptide (BNP): 32.5 pg/mL (ref 0–125)

## 2013-03-22 MED ORDER — MORPHINE SULFATE 4 MG/ML IJ SOLN
4.0000 mg | Freq: Once | INTRAMUSCULAR | Status: AC
  Administered 2013-03-22: 4 mg via INTRAVENOUS
  Filled 2013-03-22: qty 1

## 2013-03-22 MED ORDER — IPRATROPIUM BROMIDE 0.02 % IN SOLN
0.5000 mg | RESPIRATORY_TRACT | Status: DC
  Administered 2013-03-22 (×2): 0.5 mg via RESPIRATORY_TRACT
  Filled 2013-03-22 (×2): qty 2.5

## 2013-03-22 MED ORDER — ALBUTEROL SULFATE (5 MG/ML) 0.5% IN NEBU: 2.5000 mg | INHALATION_SOLUTION | RESPIRATORY_TRACT | Status: DC

## 2013-03-22 MED ORDER — METHYLPREDNISOLONE SODIUM SUCC 125 MG IJ SOLR
125.0000 mg | Freq: Once | INTRAMUSCULAR | Status: AC
  Administered 2013-03-22: 125 mg via INTRAVENOUS
  Filled 2013-03-22: qty 2

## 2013-03-22 MED ORDER — ALBUTEROL SULFATE (5 MG/ML) 0.5% IN NEBU
2.5000 mg | INHALATION_SOLUTION | RESPIRATORY_TRACT | Status: DC
  Administered 2013-03-22 (×2): 2.5 mg via RESPIRATORY_TRACT
  Filled 2013-03-22 (×2): qty 0.5

## 2013-03-22 MED ORDER — ONDANSETRON HCL 4 MG/2ML IJ SOLN
INTRAMUSCULAR | Status: AC
  Administered 2013-03-22: 18:00:00
  Filled 2013-03-22: qty 2

## 2013-03-22 MED ORDER — LORAZEPAM 1 MG PO TABS: 1.0000 mg | ORAL_TABLET | Freq: Three times a day (TID) | ORAL | Status: DC | PRN

## 2013-03-22 MED ORDER — IPRATROPIUM BROMIDE 0.02 % IN SOLN: 0.5000 mg | RESPIRATORY_TRACT | Status: DC

## 2013-03-22 MED ORDER — SODIUM CHLORIDE 0.9 % IV SOLN
Freq: Once | INTRAVENOUS | Status: AC
  Administered 2013-03-22: 18:00:00 via INTRAVENOUS

## 2013-03-22 MED ORDER — PREDNISONE 10 MG PO TABS: 20.0000 mg | ORAL_TABLET | Freq: Two times a day (BID) | ORAL | Status: DC

## 2013-03-22 NOTE — ED Provider Notes (Signed)
History     CSN: [id number]  Arrival date & time 03/22/13  1654   First MD Initiated Contact with Patient 03/22/13 1715      Chief Complaint  Patient presents with  . Shortness of Breath  . Chest Pain    (Consider location/radiation/quality/duration/timing/severity/associated sxs/prior treatment) HPI Comments: Patient with history of asthma, with recent admission for exacerbation that led to respiratory failure requiring intubation.  She was hospitalized for three weeks at [facility] and was discharged 3 1/2 weeks ago.  She started this morning with wheezing, difficulty breathing.  She has a headache, feels tight in the chest.  Has given self two nebs at home but these have not helped.  She denies fevers or chills.  No productive cough.    Patient is a 47 y.o. female presenting with shortness of breath. The history is provided by the patient.  Shortness of Breath Severity:  Moderate Onset quality:  Sudden Duration:  8 hours Timing:  Constant Progression:  Worsening Chronicity:  Recurrent Context: activity and pollens   Relieved by:  Nothing Worsened by:  Nothing tried Ineffective treatments:  None tried   Past Medical History  Diagnosis Date  . Arthritis   . Asthma   . Hypertension   . Influenza B April 2014    Complicated by multi-organ failure  . Critical illness myopathy April 2014    Past Surgical History  Procedure Laterality Date  . Tubal ligation    . Cesarean section      History reviewed. No pertinent family history.  History  Substance Use Topics  . Smoking status: Former Smoker -- 0.25 packs/day for 20 years    Types: Cigarettes    Quit date: 01/31/2013  . Smokeless tobacco: Never Used  . Alcohol Use: No    OB History   Grav Para Term Preterm Abortions TAB SAB Ect Mult Living                  Review of Systems  Respiratory: Positive for shortness of breath.   All other systems reviewed and are negative.    Allergies  Other and  Tomato  Home Medications   Current Outpatient Rx  Name  Route  Sig  Dispense  Refill  . lisinopril (PRINIVIL,ZESTRIL) 10 MG tablet   Oral   Take 10 mg by mouth daily.         Marland Kitchen albuterol (PROAIR HFA) 108 (90 BASE) MCG/ACT inhaler   Inhalation   Inhale 2 puffs into the lungs every 6 (six) hours as needed for wheezing.   1 Inhaler   3   . albuterol (PROVENTIL) (2.5 MG/3ML) 0.083% nebulizer solution   Nebulization   Take 3 mLs (2.5 mg total) by nebulization every 6 (six) hours as needed for wheezing. DX 493.92   120 mL   2   . budesonide-formoterol (SYMBICORT) 160-4.5 MCG/ACT inhaler   Inhalation   Inhale 2 puffs into the lungs 2 (two) times daily.   1 Inhaler   0   . diltiazem (CARDIZEM CD) 240 MG 24 hr capsule   Oral   Take 1 capsule (240 mg total) by mouth daily.   30 capsule   0   . fluticasone (FLONASE) 50 MCG/ACT nasal spray   Nasal   Place 2 sprays into the nose daily.   16 g   0     BP 148/91  Pulse 91  Temp(Src) 98.9 F (37.2 C) (Oral)  Resp 20  SpO2 96%  LMP  03/21/2013  Physical Exam  Nursing note and vitals reviewed. Constitutional: She is oriented to person, place, and time. She appears well-developed and well-nourished.  She appears quite anxious and is hyperventilating.  HENT:  Head: Normocephalic and atraumatic.  Mouth/Throat: Oropharynx is clear and moist.  Neck: Normal range of motion. Neck supple.  Cardiovascular: Normal rate and regular rhythm.   No murmur heard. Pulmonary/Chest: She has no rales.  There are bilateral wheezes present.  She is quite anxious and is hyperventilating, with sats of 96%.  Abdominal: Soft. Bowel sounds are normal. She exhibits no distension. There is no tenderness.  Musculoskeletal: Normal range of motion. She exhibits no edema.  Lymphadenopathy:    She has no cervical adenopathy.  Neurological: She is alert and oriented to person, place, and time.  Skin: Skin is warm and dry. She is not diaphoretic.     ED Course  Procedures (including critical care time)  Labs Reviewed  CBC WITH DIFFERENTIAL  COMPREHENSIVE METABOLIC PANEL  TROPONIN I  PRO B NATRIURETIC PEPTIDE   No results found.   No diagnosis found.   Date: 03/22/2013  Rate: 92  Rhythm: normal sinus rhythm  QRS Axis: normal  Intervals: normal  ST/T Wave abnormalities: normal  Conduction Disutrbances:none  Narrative Interpretation:   Old EKG Reviewed: unchanged    MDM  The patient presents here with shortness of breath and a history of asthma requiring intubation.  She was wheezing upon presentation but has made marked improvement with nebs and steroids in the ED.  I have cautiously observed her in the ED and she is adamant about wanting to go home.  She is not hypoxic, is talking on the phone without difficulty, and is in no distress.  I am concerned about her history and discussed admission with her.  She does not want to stay and assures me she will return if she worsens.  She will be prescribed prednisone and has been instructed to continue her nebs every four hours at home.        Geoffery Lyons, MD 03/22/13 [phone]

## 2013-03-22 NOTE — ED Notes (Signed)
Pt to triage in w/c, reports sob and chest pain x this am.

## 2013-03-22 NOTE — Telephone Encounter (Signed)
[id number]  

## 2013-03-25 NOTE — Telephone Encounter (Signed)
ATC patient, no answer [id number] 

## 2013-03-26 MED ORDER — ALBUTEROL SULFATE HFA 108 (90 BASE) MCG/ACT IN AERS: 2.0000 | INHALATION_SPRAY | Freq: Four times a day (QID) | RESPIRATORY_TRACT | Status: DC | PRN

## 2013-03-26 NOTE — Telephone Encounter (Signed)
Called, spoke with pt.  Pt reports this msg has already been taken care of.  States on May 30 she went to the [facility] ED because her "asthma was acting up."  She was given prednisone.  Pt states her breathing is about the same.  States she has SOB when she is up walking around and some chest tightness.  Feels the chest tightness has improved some. Wheezing has now resolved.  Reports she did have a cough with pale yellow mucus which has also resolved per pt.  States she is using albuterol neb tid with relief.  Is out of albuterol hfa and cannot afford this right now -- requesting sample.  I have placed 1 sample at the front for pt.  She is aware.  I offered OV with either VS or TP to f/u on being in the ED.  Pt states she believes she is fine for now.  She would like to hold off on scheduling at this time.  Advised to call office back if her symptoms do not improve or worsen or seek emergency care if needed.  She verbalized understanding and voiced no further questions or concerns at this time.  Will route to VS as FYI.  ** Next pending OV with VS is scheduled on May 15, 2013.

## 2013-03-28 ENCOUNTER — Telehealth: Payer: Self-pay | Admitting: Pulmonary Disease

## 2013-03-28 NOTE — Telephone Encounter (Signed)
Per VS, defer to PCP Spoke with Arline Asp at [facility] and notified her of this She verbalized understanding Nothing further needed

## 2013-04-03 ENCOUNTER — Telehealth: Payer: Self-pay | Admitting: Pulmonary Disease

## 2013-04-03 NOTE — Telephone Encounter (Signed)
I spoke with pt and she was just calling to confirm her sleep study appt. She needed nothing further

## 2013-04-15 ENCOUNTER — Telehealth: Payer: Self-pay | Admitting: Pulmonary Disease

## 2013-04-15 NOTE — Telephone Encounter (Signed)
Received a refill request from [facility] for Lorazepam 1mg . This was last filled on 03/23/2013.  Lorazepam 1mg  1 TID PRN #[id number]  Please advise. Pt is asking for a refill. Thanks.

## 2013-04-16 NOTE — Telephone Encounter (Signed)
Needs to be addressed by her PCP.

## 2013-04-16 NOTE — Telephone Encounter (Signed)
Pharmacy is aware that they need to contact her PCP for refills on this medication.

## 2013-04-19 ENCOUNTER — Ambulatory Visit (HOSPITAL_BASED_OUTPATIENT_CLINIC_OR_DEPARTMENT_OTHER): Payer: Managed Care, Other (non HMO) | Attending: Pulmonary Disease

## 2013-04-19 VITALS — Ht 66.0 in | Wt 265.0 lb

## 2013-04-19 DIAGNOSIS — R0683 Snoring: Secondary | ICD-10-CM

## 2013-04-19 DIAGNOSIS — G4733 Obstructive sleep apnea (adult) (pediatric): Secondary | ICD-10-CM | POA: Insufficient documentation

## 2013-04-30 ENCOUNTER — Telehealth: Payer: Self-pay | Admitting: Pulmonary Disease

## 2013-04-30 DIAGNOSIS — R0683 Snoring: Secondary | ICD-10-CM

## 2013-04-30 MED ORDER — BUDESONIDE-FORMOTEROL FUMARATE 160-4.5 MCG/ACT IN AERO: 2.0000 | INHALATION_SPRAY | Freq: Two times a day (BID) | RESPIRATORY_TRACT | Status: DC

## 2013-04-30 NOTE — Telephone Encounter (Signed)
I spoke with pt and was giving 2 samples of symbicort. We did not have any albuterol. She did not need RX sent. Nothing further needed

## 2013-05-01 ENCOUNTER — Telehealth: Payer: Self-pay | Admitting: Pulmonary Disease

## 2013-05-01 DIAGNOSIS — G4733 Obstructive sleep apnea (adult) (pediatric): Secondary | ICD-10-CM

## 2013-05-01 NOTE — Procedures (Signed)
NAME:  Kathleen Rodriguez, Kathleen Rodriguez NO.:  [id number]  MEDICAL RECORD NO.:  [id number]          PATIENT TYPE:  OUT  LOCATION:  SLEEP CENTER                 FACILITY:  [facility]  PHYSICIAN:  [name], MD        DATE OF BIRTH:  [birth year]  DATE OF STUDY:  04/19/2013                           NOCTURNAL POLYSOMNOGRAM  REFERRING PHYSICIAN:  [name], MD  INDICATION FOR STUDY:  Ms. Silbernagel is a 47 year old female who has a history of snoring, sleep disruption, and daytime sleepiness.  She is referred to the sleep lab for further evaluation of hypersomnia with obstructive sleep apnea.  Height is 5 feet 6 inches, weight is 255 pounds.  BMI is 23, neck size is 15 inches.  EPWORTH SLEEPINESS SCORE:  15.  MEDICATIONS:  Medications reviewed in her chart.  SLEEP ARCHITECTURE:  Total recording time was 401 minutes.  Total sleep time was 197 minutes.  Sleep efficiency was 49%.  Sleep latency was 81 minutes. REM latency was 114 minutes.  The study was notable for a lack of slow-wave sleep and reduction in the percentage of REM sleep.  She slept in both the supine and nonsupine positions.  RESPIRATORY DATA:  The average respiratory rate was 16.  Loud snoring was noted by the technician.  The overall apnea/hypopnea index was 16.4. There was 1 central apneic event.  The remainder of the events were obstructive in nature.  OXYGEN DATA:  The baseline oxygenation was 92%.  The oxygen saturation nadir was 84%.  The patient spent a total of 1.2 minutes with an oxygen saturation below 88%.  The study was conducted without the use of supplemental oxygen.  CARDIAC DATA:  The average heart rate was 66 and the rhythm strip showed normal sinus rhythm.  MOVEMENT-PARASOMNIA:  The patient had 2 restroom trips and the periodic limb movement index was 0.  IMPRESSIONS-RECOMMENDATIONS:  This study shows evidence for moderate obstructive sleep apnea with an apnea/hypopnea index of 16.4 and  oxygen saturation nadir of 84%.  In addition to weight reduction, additional therapeutic options could include CPAP therapy, oral appliance, or surgical interventions.     [name], MD Diplomat, American Board of Sleep Medicine    VS/MEDQ  D:  05/01/2013 09:42:31  T:  05/01/2013 10:40:29  Job:  [id number]

## 2013-05-01 NOTE — Telephone Encounter (Signed)
Pt is aware of sleep study results. Confirmed her appointment on 05/15/13. Nothing further was needed.

## 2013-05-01 NOTE — Telephone Encounter (Signed)
PSG 04/19/13 >> AHI 16.4, SpO2 low 84%.  Will have my nurse inform pt that sleep study shows she has sleep apnea.  Will discuss in more detail at her ROV on 05/15/13.

## 2013-05-10 ENCOUNTER — Telehealth: Payer: Self-pay | Admitting: Pulmonary Disease

## 2013-05-10 NOTE — Telephone Encounter (Signed)
Pt is aware that we do not have any samples at this time. Offered to send a rx for this but she declined because she doesn't have the money for it. Nothing further was needed.

## 2013-05-15 ENCOUNTER — Ambulatory Visit (INDEPENDENT_AMBULATORY_CARE_PROVIDER_SITE_OTHER): Payer: Managed Care, Other (non HMO) | Admitting: Pulmonary Disease

## 2013-05-15 ENCOUNTER — Encounter: Payer: Self-pay | Admitting: Pulmonary Disease

## 2013-05-15 ENCOUNTER — Telehealth: Payer: Self-pay | Admitting: Pulmonary Disease

## 2013-05-15 VITALS — BP 118/70 | HR 90 | Temp 98.0°F | Ht 65.0 in | Wt 265.0 lb

## 2013-05-15 DIAGNOSIS — R0683 Snoring: Secondary | ICD-10-CM

## 2013-05-15 DIAGNOSIS — J45909 Unspecified asthma, uncomplicated: Secondary | ICD-10-CM

## 2013-05-15 DIAGNOSIS — R5381 Other malaise: Secondary | ICD-10-CM

## 2013-05-15 DIAGNOSIS — J4540 Moderate persistent asthma, uncomplicated: Secondary | ICD-10-CM

## 2013-05-15 DIAGNOSIS — R0609 Other forms of dyspnea: Secondary | ICD-10-CM

## 2013-05-15 DIAGNOSIS — G4733 Obstructive sleep apnea (adult) (pediatric): Secondary | ICD-10-CM

## 2013-05-15 LAB — PULMONARY FUNCTION TEST

## 2013-05-15 MED ORDER — ALBUTEROL SULFATE HFA 108 (90 BASE) MCG/ACT IN AERS: 2.0000 | INHALATION_SPRAY | Freq: Four times a day (QID) | RESPIRATORY_TRACT | Status: DC | PRN

## 2013-05-15 NOTE — Assessment & Plan Note (Signed)
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  Explained that this can take several months before determining full extent of recovery.

## 2013-05-15 NOTE — Patient Instructions (Signed)
Will arrange for CPAP set up  Follow up in 2 months after CPAP set up 

## 2013-05-15 NOTE — Progress Notes (Signed)
PFT done today. 

## 2013-05-15 NOTE — Assessment & Plan Note (Signed)
Stable on current inhaler regimen. 

## 2013-05-15 NOTE — Progress Notes (Signed)
Chief Complaint  Patient presents with  . Follow-up    PFT done today    History of Present Illness: Kathleen Rodriguez is a 47 y.o. female former smoker with asthma, OSA, and recent admission for influenza pneumonia.  She is here to review her sleep study >> showed moderate OSA, and PFT >> borderline obstruction.  She was in ER since last visit for her asthma.  Her symptoms have been doing better.  She is not having cough, or sputum.  She gets occasional wheeze, and will use albuterol.  She uses symbicort bid.  She is no longer on prednisone.  She denies sinus congestion.  She still gets fatigued easily.    TESTS: 02/05/13 CT chest >> no PE, no acute findings 02/11/13 Labs >> ANA negative, Anti-GBM Ab < 1, ANCA negative 02/14/13 Echo >> EF 65-70%, mild LVH, grade 1 diastolic dysfx PSG 0/34/91 >> AHI 16.4, SpO2 low 84%. 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  Kathleen Rodriguez  has past surgical history that includes Tubal ligation and Cesarean section.  Prior to Admission medications   Medication Sig Start Date End Date Taking? Authorizing Provider  albuterol (PROVENTIL) (2.5 MG/3ML) 0.083% nebulizer solution Take 3 mLs (2.5 mg total) by nebulization every 6 (six) hours as needed for wheezing. DX 493.92 02/27/13  Yes Chesley Mires, MD  budesonide-formoterol (SYMBICORT) 160-4.5 MCG/ACT inhaler Inhale 2 puffs into the lungs 2 (two) times daily. 02/21/13  Yes Marijean Heath, NP  diltiazem (CARDIZEM CD) 240 MG 24 hr capsule Take 1 capsule (240 mg total) by mouth daily. 02/21/13  Yes Marijean Heath, NP  fluticasone (FLONASE) 50 MCG/ACT nasal spray Place 2 sprays into the nose daily. 02/21/13  Yes Marijean Heath, NP  albuterol (PROVENTIL HFA;VENTOLIN HFA) 108 (90 BASE) MCG/ACT inhaler Inhale 2 puffs into the lungs  every 6 (six) hours as needed for wheezing.    Historical Provider, MD    Allergies  Allergen Reactions  . Other Hives and Itching    Ketchup  . Tomato Hives and Itching     Physical Exam:  General - No distress ENT - No sinus tenderness, MP 3, no oral exudate, no LAN Cardiac - s1s2 regular, no murmur Chest - No wheeze/rales/dullness Back - No focal tenderness Abd - Soft, non-tender Ext - No edema Neuro - Normal strength Skin - No rashes Psych - normal mood, and behavior   Assessment/Plan:  Chesley Mires, MD [place] [facility]/Critical Care/Sleep Pager:  [phone]

## 2013-05-15 NOTE — Assessment & Plan Note (Signed)
She has moderate OSA.  I have reviewed the recent sleep study results with the patient.  We discussed how sleep apnea can affect various health problems including risks for hypertension, cardiovascular disease, and diabetes.  We also discussed how sleep disruption can increase risks for accident, such as while driving.  Weight loss as a means of improving sleep apnea was also reviewed.  Additional treatment options discussed were CPAP therapy, oral appliance, and surgical intervention.  Will arrange for auto CPAP set up.

## 2013-05-15 NOTE — Telephone Encounter (Signed)
Spoke to pt. She states that she needs refill on albuterol inhaler and she is needing a spacer. Advised her that we do not have any samples of albuterol at this time. A spacer will be left up front for her to pick up.

## 2013-05-21 ENCOUNTER — Emergency Department (HOSPITAL_COMMUNITY): Payer: Managed Care, Other (non HMO)

## 2013-05-21 ENCOUNTER — Emergency Department (HOSPITAL_COMMUNITY)
Admission: EM | Admit: 2013-05-21 | Discharge: 2013-05-21 | Disposition: A | Discharge status: Home / Self Care | Payer: Managed Care, Other (non HMO) | Attending: Emergency Medicine | Admitting: Emergency Medicine

## 2013-05-21 ENCOUNTER — Encounter (HOSPITAL_COMMUNITY): Payer: Self-pay

## 2013-05-21 DIAGNOSIS — M8538 Osteitis condensans, other site: Secondary | ICD-10-CM

## 2013-05-21 DIAGNOSIS — IMO0002 Reserved for concepts with insufficient information to code with codable children: Secondary | ICD-10-CM | POA: Insufficient documentation

## 2013-05-21 DIAGNOSIS — I10 Essential (primary) hypertension: Secondary | ICD-10-CM | POA: Insufficient documentation

## 2013-05-21 DIAGNOSIS — Z8669 Personal history of other diseases of the nervous system and sense organs: Secondary | ICD-10-CM | POA: Insufficient documentation

## 2013-05-21 DIAGNOSIS — Z8709 Personal history of other diseases of the respiratory system: Secondary | ICD-10-CM | POA: Insufficient documentation

## 2013-05-21 DIAGNOSIS — Z79899 Other long term (current) drug therapy: Secondary | ICD-10-CM | POA: Insufficient documentation

## 2013-05-21 DIAGNOSIS — J45909 Unspecified asthma, uncomplicated: Secondary | ICD-10-CM | POA: Insufficient documentation

## 2013-05-21 DIAGNOSIS — M545 Low back pain: Secondary | ICD-10-CM

## 2013-05-21 DIAGNOSIS — Z8739 Personal history of other diseases of the musculoskeletal system and connective tissue: Secondary | ICD-10-CM | POA: Insufficient documentation

## 2013-05-21 DIAGNOSIS — M8530 Osteitis condensans, unspecified site: Secondary | ICD-10-CM | POA: Insufficient documentation

## 2013-05-21 DIAGNOSIS — Z87891 Personal history of nicotine dependence: Secondary | ICD-10-CM | POA: Insufficient documentation

## 2013-05-21 MED ORDER — DIAZEPAM 5 MG PO TABS
5.0000 mg | ORAL_TABLET | Freq: Once | ORAL | Status: AC
  Administered 2013-05-21: 5 mg via ORAL
  Filled 2013-05-21: qty 1

## 2013-05-21 MED ORDER — HYDROCODONE-ACETAMINOPHEN 5-325 MG PO TABS: 1.0000 | ORAL_TABLET | ORAL | Status: DC | PRN

## 2013-05-21 NOTE — ED Provider Notes (Signed)
CSN: [id number]     Arrival date & time 05/21/13  0830 History     First MD Initiated Contact with Patient 05/21/13 [phone]     Chief Complaint  Patient presents with  . Back Pain   (Consider location/radiation/quality/duration/timing/severity/associated sxs/prior Treatment) HPI Comments: Patient reports low back pain x 2 days, states she woke up with pain in her bilateral lower back.  Pain is sharp, occasionally radiates into bilateral posterior legs to level of the knee.  Worse with movement and palpation.  Has taken tylenol without improvement.  Did have diarrhea the day before the pain began, but this has completely resolved. Denies fevers, chills, body aches, loss of control of bowel or bladder, weakness or numbness of her legs, abdominal pain, N/V/D, urinary symptoms, abnormal vaginal bleeding or discharge.  LMP a few days ago, normal and on time.  Denies heavy lifting, falls, or any known injury. No hx back pain.    Patient is a 47 y.o. female presenting with back pain. The history is provided by the patient.  Back Pain Associated symptoms: no abdominal pain, no chest pain, no dysuria, no fever, no numbness and no weakness     Past Medical History  Diagnosis Date  . Arthritis   . Asthma   . Hypertension   . Influenza B April 2014    Complicated by multi-organ failure  . 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 OSA (obstructive sleep apnea) 03/20/2013   Past Surgical History  Procedure Laterality Date  . Tubal ligation    . Cesarean section     No family history on file. History  Substance Use Topics  . Smoking status: Former Smoker -- 0.25 packs/day for 20 years    Types: Cigarettes    Quit date: 01/31/2013  . Smokeless tobacco: Never Used  . Alcohol Use: No   OB History   Grav Para Term Preterm Abortions TAB SAB Ect Mult Living                 Review of Systems  Constitutional: Negative for fever.  HENT: Negative for neck pain.   Respiratory: Negative for cough  and shortness of breath.   Cardiovascular: Negative for chest pain.  Gastrointestinal: Negative for nausea, vomiting, abdominal pain and diarrhea.  Genitourinary: Negative for dysuria, urgency, frequency, vaginal bleeding and vaginal discharge.  Musculoskeletal: Positive for back pain.  Neurological: Negative for weakness and numbness.    Allergies  Other and Tomato  Home Medications   Current Outpatient Rx  Name  Route  Sig  Dispense  Refill  . albuterol (PROAIR HFA) 108 (90 BASE) MCG/ACT inhaler   Inhalation   Inhale 2 puffs into the lungs every 6 (six) hours as needed for wheezing.   1 Inhaler   5   . albuterol (PROVENTIL) (2.5 MG/3ML) 0.083% nebulizer solution   Nebulization   Take 3 mLs (2.5 mg total) by nebulization every 6 (six) hours as needed for wheezing. DX 493.92   120 mL   2   . budesonide-formoterol (SYMBICORT) 160-4.5 MCG/ACT inhaler   Inhalation   Inhale 2 puffs into the lungs 2 (two) times daily.   2 Inhaler   0   . diltiazem (CARDIZEM CD) 240 MG 24 hr capsule   Oral   Take 1 capsule (240 mg total) by mouth daily.   30 capsule   0   . ferrous fumarate (HEMOCYTE - 106 MG FE) 325 (106 FE) MG TABS   Oral   Take  1 tablet by mouth 2 (two) times daily.         . fluticasone (FLONASE) 50 MCG/ACT nasal spray   Nasal   Place 2 sprays into the nose daily.   16 g   0   . lisinopril (PRINIVIL,ZESTRIL) 10 MG tablet   Oral   Take 10 mg by mouth daily.         . Vitamin D, Ergocalciferol, (DRISDOL) 50000 UNITS CAPS   Oral   Take 50,000 Units by mouth every 7 (seven) days.          BP 131/78  Pulse 87  Temp(Src) 98 F (36.7 C) (Oral)  Resp 20  SpO2 100%  LMP 05/17/2013 Physical Exam  Nursing note and vitals reviewed. Constitutional: She appears well-developed and well-nourished. No distress.  HENT:  Head: Normocephalic and atraumatic.  Neck: Neck supple.  Pulmonary/Chest: Effort normal.  Abdominal: Soft. She exhibits no distension. There  is no tenderness. There is no rebound and no guarding.  obese  Musculoskeletal: Normal range of motion. She exhibits no edema.       Back:  Spine without crepitus or stepoffs.  Gait is normal.  Lower extremities:  Strength 5/5, sensation intact, distal pulses intact.     Neurological: She is alert.  Skin: She is not diaphoretic.    ED Course   Procedures (including critical care time)  Labs Reviewed - No data to display Dg Lumbar Spine Complete  05/21/2013   *RADIOLOGY REPORT*  Clinical Data: Pain  LUMBAR SPINE - COMPLETE 4+ VIEW  Comparison: None.  Findings: Frontal, lateral, spot lumbosacral lateral, and bilateral oblique views were obtained.  There are five non-rib bearing lumbar type U bodies.  There is no fracture or spondylolisthesis.  Disc spaces appear intact.  There is no appreciable facet arthropathy. There is mild sclerosis along the inferior aspects of both sacroiliac joints.  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  There is no appreciable lumbar arthropathy.  No fracture or spondylolisthesis.   Original Report Authenticated By: Bretta Bang, M.D.   Discussed xray results, treatment, and follow up with Dr Denton Lank.    1. Osteitis condensans ilii   2. Low back pain     MDM  Pt with low back pain with occasional radiation.  Neurovascularly intact. No red flags for back pain.  As pt is nearly 49 and has never had back pain previously, have ordered lumbar film that shows osteitis condensans ilii.  Pt given valium in ED, norco for home.  PCP follow up.  Discussed all results with patient.  Pt given return precautions.  Pt verbalizes understanding and agrees with plan.      Trixie Dredge, PA-C 05/21/13 1303

## 2013-05-21 NOTE — ED Notes (Signed)
Pt c/o lower back pain x2, no injury noted, no radiation noted, pt ambulatory

## 2013-05-23 NOTE — ED Provider Notes (Signed)
Medical screening examination/treatment/procedure(s) were performed by non-physician practitioner and as supervising physician I was immediately available for consultation/collaboration.   Suzi Roots, MD 05/23/13 [phone]

## 2013-05-28 ENCOUNTER — Telehealth: Payer: Self-pay | Admitting: Pulmonary Disease

## 2013-05-28 NOTE — Telephone Encounter (Signed)
I spoke with the pt spouse and he states the pt is not doing what she needs to do and she is going to end up back in the hospital. He states she is still smoking, not following her diet and not taking good care of herself. I advised I am not sure what we can do but make an appt for the pt. He states he is not sure this will do any good. I advised him to contact the pt pcp to see if maybe they could set-up some education or home health for the pt. He states he will do this. Carron Curie, CMA

## 2013-05-29 ENCOUNTER — Telehealth: Payer: Self-pay | Admitting: Pulmonary Disease

## 2013-05-29 NOTE — Telephone Encounter (Signed)
Noted  

## 2013-05-29 NOTE — Telephone Encounter (Signed)
Spoke with patient via home # Patient states she was going to have to pay co pay and at this time she does not have the money for this Will forward to Dr. Craige Cotta so that he is aware and will also copy msg to staff msg for [facility]

## 2013-06-10 ENCOUNTER — Telehealth: Payer: Self-pay | Admitting: Pulmonary Disease

## 2013-06-10 NOTE — Telephone Encounter (Signed)
LMTCB x1 for pt.  

## 2013-06-11 NOTE — Telephone Encounter (Signed)
lmtcb x2 

## 2013-06-12 NOTE — Telephone Encounter (Signed)
Called, spoke with pt - Pt states she wanted to know if order had been sent to [facility] to start cpap yet, but since leaving msg, she has spoken with [facility] and is scheduled to get set up with cpap on Monday.  Pt states nothing further is needed at this time.

## 2013-07-01 ENCOUNTER — Telehealth: Payer: Self-pay | Admitting: Pulmonary Disease

## 2013-07-01 NOTE — Telephone Encounter (Signed)
I spoke with pt. She stated disability was requesting her sleep study results and her breathing test results. This is the only thing they did ont receive. I pulled fax cover form her chart where they did request records and faxed this information over to them. Nothing further needed

## 2013-07-03 ENCOUNTER — Telehealth: Payer: Self-pay | Admitting: Pulmonary Disease

## 2013-07-03 MED ORDER — AEROCHAMBER Z-STAT PLUS MISC: Status: DC

## 2013-07-03 MED ORDER — ALBUTEROL SULFATE HFA 108 (90 BASE) MCG/ACT IN AERS: 2.0000 | INHALATION_SPRAY | Freq: Four times a day (QID) | RESPIRATORY_TRACT | Status: DC | PRN

## 2013-07-03 NOTE — Telephone Encounter (Signed)
Pt was giving aerochamber. Nothing further needed

## 2013-07-03 NOTE — Telephone Encounter (Signed)
I spoke with pt and is aware 1 sample of ventolin left upfront for pick up. Nothing further needed

## 2013-07-16 ENCOUNTER — Ambulatory Visit (INDEPENDENT_AMBULATORY_CARE_PROVIDER_SITE_OTHER): Payer: Managed Care, Other (non HMO) | Admitting: Pulmonary Disease

## 2013-07-16 ENCOUNTER — Encounter: Payer: Self-pay | Admitting: Pulmonary Disease

## 2013-07-16 VITALS — BP 112/80 | HR 78 | Temp 97.8°F | Ht 66.0 in | Wt 269.0 lb

## 2013-07-16 DIAGNOSIS — J4540 Moderate persistent asthma, uncomplicated: Secondary | ICD-10-CM

## 2013-07-16 DIAGNOSIS — R5381 Other malaise: Secondary | ICD-10-CM

## 2013-07-16 DIAGNOSIS — J45909 Unspecified asthma, uncomplicated: Secondary | ICD-10-CM

## 2013-07-16 DIAGNOSIS — R0683 Snoring: Secondary | ICD-10-CM

## 2013-07-16 DIAGNOSIS — R0609 Other forms of dyspnea: Secondary | ICD-10-CM

## 2013-07-16 DIAGNOSIS — G4733 Obstructive sleep apnea (adult) (pediatric): Secondary | ICD-10-CM

## 2013-07-16 DIAGNOSIS — J45901 Unspecified asthma with (acute) exacerbation: Secondary | ICD-10-CM

## 2013-07-16 MED ORDER — PREDNISONE 10 MG PO TABS: ORAL_TABLET | ORAL | Status: DC

## 2013-07-16 MED ORDER — MOMETASONE FUROATE 50 MCG/ACT NA SUSP: 2.0000 | Freq: Every day | NASAL | Status: DC

## 2013-07-16 MED ORDER — BUDESONIDE-FORMOTEROL FUMARATE 160-4.5 MCG/ACT IN AERO: 2.0000 | INHALATION_SPRAY | Freq: Two times a day (BID) | RESPIRATORY_TRACT | Status: DC

## 2013-07-16 MED ORDER — ALBUTEROL SULFATE (2.5 MG/3ML) 0.083% IN NEBU: 2.5000 mg | INHALATION_SOLUTION | Freq: Four times a day (QID) | RESPIRATORY_TRACT | Status: DC | PRN

## 2013-07-16 NOTE — Patient Instructions (Signed)
Prednisone 10 mg pill >> 4 pills daily for 2 days, 3 pills daily for 2 days, 2 pills daily for 2 days, 1 pill daily for 2 days Call if not feeling better Follow up in 3 months

## 2013-07-16 NOTE — Assessment & Plan Note (Signed)
She hopefully will get her CPAP machine soon.

## 2013-07-16 NOTE — Assessment & Plan Note (Signed)
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  Refilled her handicap parking form.

## 2013-07-16 NOTE — Assessment & Plan Note (Signed)
Will give course of prednisone.  I don't think she needs antibiotics or chest xray at present.

## 2013-07-16 NOTE — Assessment & Plan Note (Signed)
Advised her to use symbicort two puffs twice per day.  Refilled her albuterol nebulizer medication.

## 2013-07-16 NOTE — Progress Notes (Signed)
Chief Complaint  Patient presents with  . Sleep Apnea    Currently using CPAP, using it on occasion. Denies problems with the machine, mask or pressure.    History of Present Illness: Kathleen Rodriguez is a 47 y.o. female former smoker with asthma, OSA, and recent admission for influenza pneumonia.  She has not received her CPAP machine yet.  She needed to save money for co-pay before getting machine.  She should get this in next week or two.  Her son had a cold, and she thinks she got this.  She has cough with clear sputum for the past several days.  She is getting wheeze and scratchy throat.  She does not have sinus congestion, or fever.  She has been using symbicort daily and and albuterol several times per day >> these don't help as much as before.  TESTS: 02/05/13 CT chest >> no PE, no acute findings 02/11/13 Labs >> ANA negative, Anti-GBM Ab < 1, ANCA negative 02/14/13 Echo >> EF 65-70%, mild LVH, grade 1 diastolic dysfx PSG 12/30/63 >> AHI 16.4, SpO2 low 84%. 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  Kathleen Rodriguez  has past surgical history that includes Tubal ligation and Cesarean section.  Prior to Admission medications   Medication Sig Start Date End Date Taking? Authorizing Provider  albuterol (PROVENTIL) (2.5 MG/3ML) 0.083% nebulizer solution Take 3 mLs (2.5 mg total) by nebulization every 6 (six) hours as needed for wheezing. DX 493.92 02/27/13  Yes Chesley Mires, MD  budesonide-formoterol (SYMBICORT) 160-4.5 MCG/ACT inhaler Inhale 2 puffs into the lungs 2 (two) times daily. 02/21/13  Yes Marijean Heath, NP  diltiazem (CARDIZEM CD) 240 MG 24 hr capsule Take 1 capsule (240 mg total) by mouth daily. 02/21/13  Yes Marijean Heath, NP  fluticasone (FLONASE) 50  MCG/ACT nasal spray Place 2 sprays into the nose daily. 02/21/13  Yes Marijean Heath, NP  albuterol (PROVENTIL HFA;VENTOLIN HFA) 108 (90 BASE) MCG/ACT inhaler Inhale 2 puffs into the lungs every 6 (six) hours as needed for wheezing.    Historical Provider, MD    Allergies  Allergen Reactions  . Other Hives and Itching    Ketchup  . Tomato Hives and Itching     Physical Exam:  General - No distress ENT - No sinus tenderness, MP 3, no oral exudate, no LAN Cardiac - s1s2 regular, no murmur Chest - b/l expiratory wheezing Back - No focal tenderness Abd - Soft, non-tender Ext - No edema Neuro - Normal strength Skin - No rashes Psych - normal mood, and behavior   Assessment/Plan:  Chesley Mires, MD [facility]/Critical [facility]/Sleep Pager:  [phone]

## 2013-07-19 ENCOUNTER — Telehealth: Payer: Self-pay | Admitting: Pulmonary Disease

## 2013-07-19 NOTE — Telephone Encounter (Signed)
Called, spoke with pt.  There is no documentation that pt received this year's flu vaccine in her epic chart.  Pt is requesting to come in for this next week.  We have scheduled her for a flu vaccination on Friday, Oct 3 at 9 am.  She verbalized understanding and voiced no further questions or concerns at this time.

## 2013-07-26 ENCOUNTER — Ambulatory Visit: Payer: Managed Care, Other (non HMO)

## 2013-08-02 ENCOUNTER — Ambulatory Visit: Payer: Managed Care, Other (non HMO)

## 2013-08-12 ENCOUNTER — Telehealth: Payer: Self-pay | Admitting: Pulmonary Disease

## 2013-08-12 NOTE — Telephone Encounter (Signed)
I called and spoke with pt. She reports she can not remember when she last had a tetnus vaccine. She reports that she is aware she has never had a TDAP (since this is what we offer). She stated she needs this in order to start a new jon. She is wanting to know if she can get the TDAP here. Please advise VS thanks

## 2013-08-12 NOTE — Telephone Encounter (Signed)
I spoke with pt. She stated she will call back to schedule this. Nothing further needed

## 2013-08-12 NOTE — Telephone Encounter (Signed)
Okay to give shot

## 2013-08-13 ENCOUNTER — Encounter: Payer: Self-pay | Admitting: *Deleted

## 2013-08-13 ENCOUNTER — Ambulatory Visit (INDEPENDENT_AMBULATORY_CARE_PROVIDER_SITE_OTHER): Payer: Managed Care, Other (non HMO)

## 2013-08-13 DIAGNOSIS — Z23 Encounter for immunization: Secondary | ICD-10-CM

## 2013-09-03 ENCOUNTER — Telehealth: Payer: Self-pay | Admitting: Pulmonary Disease

## 2013-09-03 ENCOUNTER — Ambulatory Visit: Payer: Managed Care, Other (non HMO) | Admitting: Internal Medicine

## 2013-09-03 MED ORDER — PREDNISONE (PAK) 5 MG PO TABS: ORAL_TABLET | ORAL | Status: DC

## 2013-09-03 NOTE — Telephone Encounter (Signed)
I spoke with pt. She c/o increase SOB (pt sounded very SOB on the phone). She reports she will try to have someone to bring her in for OV today at 3:15 with MW. Nothing further needed

## 2013-09-03 NOTE — Telephone Encounter (Signed)
Spoke with patient; aware of Rx sent to pharmacy on file. Pt aware that she needs OV soon with VS per last OV. Pt stated she would call back to schedule as she was on another phone call at the time I called her.

## 2013-09-03 NOTE — Telephone Encounter (Signed)
Call Documentation    Tommie Sams, CMA at 09/03/2013 8:56 AM    Status: Signed        I spoke with pt. She c/o increase SOB (pt sounded very SOB on the phone). She reports she will try to have someone to bring her in for OV today at 3:15 with MW. Nothing further needed   Pt was to see MW today; left without being seen; VS patient seen last 07-16-13 and to schedule 3 month ROV (not scheduled as of today). Pt is requesting to have prednisone Rx called to pharmacy to help with SOB. SN as doc of day please advise. (VS is considered off on doc schedule). Thanks.

## 2013-09-03 NOTE — Telephone Encounter (Signed)
Per SN---  Prednisone dosepak  5 mg  6 day pack to take as directed.  Schedule a follow up with Dr. Craige Cotta.

## 2013-09-05 ENCOUNTER — Telehealth: Payer: Self-pay | Admitting: Pulmonary Disease

## 2013-09-05 NOTE — Telephone Encounter (Signed)
Called, spoke with pt.  Pt reports she was calling earlier to schedule a f/u with Dr. Craige Cotta.  Pt reports she has already scheduled this with the ladies at the front.  I did confirm pt has a pending OV with VS on Oct 28, 2013.  Pt reports nothing further needed at this time.

## 2013-09-21 ENCOUNTER — Emergency Department (HOSPITAL_BASED_OUTPATIENT_CLINIC_OR_DEPARTMENT_OTHER)
Admission: EM | Admit: 2013-09-21 | Discharge: 2013-09-21 | Disposition: A | Discharge status: Home / Self Care | Payer: Managed Care, Other (non HMO) | Attending: Emergency Medicine | Admitting: Emergency Medicine

## 2013-09-21 ENCOUNTER — Encounter (HOSPITAL_BASED_OUTPATIENT_CLINIC_OR_DEPARTMENT_OTHER): Payer: Self-pay | Admitting: Emergency Medicine

## 2013-09-21 DIAGNOSIS — Z79899 Other long term (current) drug therapy: Secondary | ICD-10-CM | POA: Insufficient documentation

## 2013-09-21 DIAGNOSIS — J45901 Unspecified asthma with (acute) exacerbation: Secondary | ICD-10-CM | POA: Insufficient documentation

## 2013-09-21 DIAGNOSIS — J45909 Unspecified asthma, uncomplicated: Secondary | ICD-10-CM

## 2013-09-21 DIAGNOSIS — I10 Essential (primary) hypertension: Secondary | ICD-10-CM | POA: Insufficient documentation

## 2013-09-21 DIAGNOSIS — Z8669 Personal history of other diseases of the nervous system and sense organs: Secondary | ICD-10-CM | POA: Insufficient documentation

## 2013-09-21 DIAGNOSIS — Z87891 Personal history of nicotine dependence: Secondary | ICD-10-CM | POA: Insufficient documentation

## 2013-09-21 DIAGNOSIS — IMO0002 Reserved for concepts with insufficient information to code with codable children: Secondary | ICD-10-CM | POA: Insufficient documentation

## 2013-09-21 DIAGNOSIS — M129 Arthropathy, unspecified: Secondary | ICD-10-CM | POA: Insufficient documentation

## 2013-09-21 MED ORDER — ALBUTEROL (5 MG/ML) CONTINUOUS INHALATION SOLN
INHALATION_SOLUTION | RESPIRATORY_TRACT | Status: AC
  Filled 2013-09-21: qty 20

## 2013-09-21 MED ORDER — ALBUTEROL SULFATE (5 MG/ML) 0.5% IN NEBU
10.0000 mg | INHALATION_SOLUTION | Freq: Once | RESPIRATORY_TRACT | Status: AC
  Administered 2013-09-21: 10 mg via RESPIRATORY_TRACT

## 2013-09-21 MED ORDER — IPRATROPIUM BROMIDE 0.02 % IN SOLN
0.5000 mg | Freq: Once | RESPIRATORY_TRACT | Status: AC
  Administered 2013-09-21: 0.5 mg via RESPIRATORY_TRACT
  Filled 2013-09-21: qty 2.5

## 2013-09-21 MED ORDER — ALBUTEROL SULFATE (5 MG/ML) 0.5% IN NEBU: 5.0000 mg | INHALATION_SOLUTION | Freq: Once | RESPIRATORY_TRACT | Status: DC

## 2013-09-21 MED ORDER — PREDNISONE 20 MG PO TABS: 60.0000 mg | ORAL_TABLET | Freq: Every day | ORAL | Status: DC

## 2013-09-21 MED ORDER — PREDNISONE 50 MG PO TABS
60.0000 mg | ORAL_TABLET | Freq: Once | ORAL | Status: AC
  Administered 2013-09-21: 60 mg via ORAL
  Filled 2013-09-21 (×2): qty 1

## 2013-09-21 NOTE — ED Provider Notes (Signed)
CSN: [id number]     Arrival date & time 09/21/13  4098 History   First MD Initiated Contact with Patient 09/21/13 0753     Chief Complaint  Patient presents with  . Shortness of Breath    HPI Patient presents with cough and shortness of breath for approximately one week.  Has history of asthma/COPD.  Recently tapered off prednisone.  Has been using her albuterol in Symbicort at home with no relief.  Patient currently actively wheezing. Past Medical History  Diagnosis Date  . Arthritis   . Asthma   . Hypertension   . Influenza B April 2014    Complicated by multi-organ failure  . 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 OSA (obstructive sleep apnea) 03/20/2013   Past Surgical History  Procedure Laterality Date  . Tubal ligation    . Cesarean section     No family history on file. History  Substance Use Topics  . Smoking status: Former Smoker -- 0.25 packs/day for 20 years    Types: Cigarettes    Quit date: 01/31/2013  . Smokeless tobacco: Never Used  . Alcohol Use: No   OB History   Grav Para Term Preterm Abortions TAB SAB Ect Mult Living                 Review of Systems All other systems reviewed and are ne Allergies  Other and Tomato  Home Medications   Current Outpatient Rx  Name  Route  Sig  Dispense  Refill  . albuterol (PROVENTIL) (2.5 MG/3ML) 0.083% nebulizer solution   Nebulization   Take 3 mLs (2.5 mg total) by nebulization every 6 (six) hours as needed for wheezing. DX 493.92   120 mL   2   . albuterol (VENTOLIN HFA) 108 (90 BASE) MCG/ACT inhaler   Inhalation   Inhale 2 puffs into the lungs every 6 (six) hours as needed for wheezing.   1 Inhaler   0   . budesonide-formoterol (SYMBICORT) 160-4.5 MCG/ACT inhaler   Inhalation   Inhale 2 puffs into the lungs 2 (two) times daily.   2 Inhaler   0   . diltiazem (CARDIZEM CD) 240 MG 24 hr capsule   Oral   Take 1 capsule (240 mg total) by mouth daily.   30 capsule   0   . ferrous fumarate  (HEMOCYTE - 106 MG FE) 325 (106 FE) MG TABS   Oral   Take 1 tablet by mouth 2 (two) times daily.         . fluticasone (FLONASE) 50 MCG/ACT nasal spray   Nasal   Place 2 sprays into the nose daily.   16 g   0   . lisinopril (PRINIVIL,ZESTRIL) 10 MG tablet   Oral   Take 10 mg by mouth daily.         . mometasone (NASONEX) 50 MCG/ACT nasal spray   Nasal   Place 2 sprays into the nose daily.   17 g   11   . predniSONE (DELTASONE) 20 MG tablet   Oral   Take 3 tablets (60 mg total) by mouth daily with breakfast.   15 tablet   0   . Spacer/Aero-Holding Chambers (AEROCHAMBER Z-STAT PLUS) inhaler      Use as instructed   1 each   0   . Vitamin D, Ergocalciferol, (DRISDOL) 50000 UNITS CAPS   Oral   Take 50,000 Units by mouth every 7 (seven) days.  BP 138/64  Pulse 85  Temp(Src) 98.4 F (36.9 C) (Oral)  Resp 20  Ht 5\' 6"  (1.676 m)  Wt 267 lb (121.11 kg)  BMI 43.12 kg/m2  SpO2 98%  LMP 08/26/2013 Physical Exam  Nursing note and vitals reviewed. Constitutional: She is oriented to person, place, and time. She appears well-developed and well-nourished. No distress.  HENT:  Head: Normocephalic and atraumatic.  Eyes: Pupils are equal, round, and reactive to light.  Neck: Normal range of motion.  Cardiovascular: Normal rate and intact distal pulses.   Pulmonary/Chest: Accessory muscle usage present. Tachypnea noted. She is in respiratory distress. She has wheezes.  Abdominal: Normal appearance. She exhibits no distension.  Musculoskeletal: Normal range of motion.  Neurological: She is alert and oriented to person, place, and time. No cranial nerve deficit.  Skin: Skin is warm and dry. No rash noted.  Psychiatric: She has a normal mood and affect. Her behavior is normal.    ED Course  Procedures (including critical care time)  Medications  ipratropium (ATROVENT) nebulizer solution 0.5 mg (0.5 mg Nebulization Given 09/21/13 0800)  predniSONE (DELTASONE)  tablet 60 mg (60 mg Oral Given 09/21/13 0758)  albuterol (PROVENTIL) (5 MG/ML) 0.5% nebulizer solution 10 mg (10 mg Nebulization Given 09/21/13 0800)    Labs Review Labs Reviewed - No data to display Imaging Review No results found.    MDM   1. Acute asthma      After treatment in the ED the patient feels back to baseline and wants to go home.  Nelia Shi, MD 09/21/13 [phone]

## 2013-09-21 NOTE — ED Notes (Signed)
Sob cough for over one week.  Was on prednisone, but symptoms returned three days ago.  Pt actively wheezing, audible congestion, using accessory muscles.

## 2013-09-23 ENCOUNTER — Encounter: Payer: Self-pay | Admitting: Pulmonary Disease

## 2013-09-23 ENCOUNTER — Telehealth: Payer: Self-pay | Admitting: Pulmonary Disease

## 2013-09-23 ENCOUNTER — Ambulatory Visit (INDEPENDENT_AMBULATORY_CARE_PROVIDER_SITE_OTHER): Payer: Managed Care, Other (non HMO) | Admitting: Pulmonary Disease

## 2013-09-23 VITALS — BP 124/88 | HR 94 | Ht 65.0 in | Wt 268.0 lb

## 2013-09-23 DIAGNOSIS — J45909 Unspecified asthma, uncomplicated: Secondary | ICD-10-CM

## 2013-09-23 DIAGNOSIS — R058 Other specified cough: Secondary | ICD-10-CM

## 2013-09-23 DIAGNOSIS — J45998 Other asthma: Secondary | ICD-10-CM

## 2013-09-23 DIAGNOSIS — R05 Cough: Secondary | ICD-10-CM

## 2013-09-23 DIAGNOSIS — G4733 Obstructive sleep apnea (adult) (pediatric): Secondary | ICD-10-CM

## 2013-09-23 DIAGNOSIS — I10 Essential (primary) hypertension: Secondary | ICD-10-CM

## 2013-09-23 MED ORDER — LOSARTAN POTASSIUM 100 MG PO TABS: 100.0000 mg | ORAL_TABLET | Freq: Every day | ORAL | Status: DC

## 2013-09-23 NOTE — Telephone Encounter (Signed)
I called and spoke with pt. She was seen in the ED yesterday and was told to f/u with VS this week. Pt is needing to be seen for asthma. Pt was offered appt for today but was not able to make it due to not having transportation. I offered pt an appt with different provider this week but states she wants to see VS bc that is her doctor. Pt is requesting to be worked in. Please advise Dr. Craige Cotta thanks

## 2013-09-23 NOTE — Telephone Encounter (Signed)
I spoke with the pt and offered appt today at 4pm, VS had an opening. The pt states she needs to contact her tide to see if they can bring her today at 4. I have scheduled her in that slot and advised her to call back and ask for me to let me know if this appt will work for her. Will await call back. Carron Curie, CMA

## 2013-09-23 NOTE — Progress Notes (Signed)
Chief Complaint  Patient presents with  . Asthma Follow Up    Breathing has gotten worse. Had 2 astham attacks since being seen last. Reports SOB, wheezing, coughing.    History of Present Illness: Kathleen Rodriguez is a 47 y.o. female former smoker with asthma, OSA, and admission for influenza pneumonia in April 2014.  She was in ER again 2 days ago for breathing trouble.  She was treated with continuous nebulizer and given prednisone.  She still has cough, sore throat, and feels short of breath.  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  She does not feel like symbicort is working as well as before.  She is worried that she might need to apply for disability.  She has lots of stress at work and home, and feels like stress makes her breathing worse.  She is not getting much cough, sputum, or wheeze >> when she does she feels this comes from her throat and upper chest.  She has not received CPAP machine yet >> she will get paycheck this month, and then get CPAP.  TESTS: 02/05/13 CT chest >> no PE, no acute findings 02/11/13 Labs >> ANA negative, Anti-GBM Ab < 1, ANCA negative 02/14/13 Echo >> EF 65-70%, mild LVH, grade 1 diastolic dysfx PSG 3/78/58 >> AHI 16.4, SpO2 low 84%. 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  Kathleen Rodriguez  has past surgical history that includes Tubal ligation and Cesarean section.  Prior to Admission medications   Medication Sig Start Date End Date Taking? Authorizing Provider  albuterol (PROVENTIL) (2.5 MG/3ML) 0.083% nebulizer solution Take 3 mLs (2.5 mg total) by nebulization every 6 (six) hours as needed for wheezing. DX 493.92 02/27/13  Yes Chesley Mires, MD  budesonide-formoterol  (SYMBICORT) 160-4.5 MCG/ACT inhaler Inhale 2 puffs into the lungs 2 (two) times daily. 02/21/13  Yes Marijean Heath, NP  diltiazem (CARDIZEM CD) 240 MG 24 hr capsule Take 1 capsule (240 mg total) by mouth daily. 02/21/13  Yes Marijean Heath, NP  fluticasone (FLONASE) 50 MCG/ACT nasal spray Place 2 sprays into the nose daily. 02/21/13  Yes Marijean Heath, NP  albuterol (PROVENTIL HFA;VENTOLIN HFA) 108 (90 BASE) MCG/ACT inhaler Inhale 2 puffs into the lungs every 6 (six) hours as needed for wheezing.    Historical Provider, MD    Allergies  Allergen Reactions  . Other Hives and Itching    Ketchup  . Tomato Hives and Itching     Physical Exam:  General - No distress ENT - No sinus tenderness, MP 3, no oral exudate, no LAN Cardiac - s1s2 regular, no murmur Chest - no wheeze/rales Back - No focal tenderness Abd - Soft, non-tender Ext - No edema Neuro - Normal strength Skin - No rashes Psych - normal mood, and behavior   Assessment/Plan:  Chesley Mires, MD [place]/Critical [facility]/Sleep Pager:  [phone]

## 2013-09-23 NOTE — Telephone Encounter (Signed)
Patient calling back.  She was able to find transportation and will be here today at 4:00.

## 2013-09-23 NOTE — Telephone Encounter (Signed)
Okay to double book visit with me. 

## 2013-09-23 NOTE — Patient Instructions (Signed)
Stop lisinopril Start losartan 100 mg daily Follow up in 2 weeks with Tammy Parrett with [facility]

## 2013-09-24 DIAGNOSIS — R058 Other specified cough: Secondary | ICD-10-CM | POA: Insufficient documentation

## 2013-09-24 DIAGNOSIS — R05 Cough: Secondary | ICD-10-CM | POA: Insufficient documentation

## 2013-09-24 DIAGNOSIS — I10 Essential (primary) hypertension: Secondary | ICD-10-CM | POA: Insufficient documentation

## 2013-09-24 NOTE — Assessment & Plan Note (Signed)
She reports she will be able to get her CPAP machine later this month.

## 2013-09-24 NOTE — Assessment & Plan Note (Addendum)
She may have upper airway instability related to post nasal drip, ACE inhibitor use and untreated sleep apnea.  She is to continue flonase.  Will change from lisinopril to losartan.

## 2013-09-24 NOTE — Assessment & Plan Note (Signed)
Explained how ACE inhibitors can cause upper airway irritation, and this can mimic some of the symptoms of asthma.  Will change her from lisinopril to losartan >> have given her script for 30 day and 90 day supply >> she will check with her pharmacy to see which amount is cheaper to purchase.  Will have her follow up with nurse practitioner in 2 weeks.  Explained to her that it can take several weeks off ACE inhibitor before determining whether this will be beneficial.

## 2013-09-24 NOTE — Assessment & Plan Note (Signed)
Difficult to determine how much of her current symptoms are related to asthma >> she has not respond as would be expected to asthma regimen.  Advised her to complete course of prednisone from ER and continue symbicort and prn albuterol for now.

## 2013-10-07 ENCOUNTER — Ambulatory Visit: Payer: Managed Care, Other (non HMO) | Admitting: Adult Health

## 2013-10-14 ENCOUNTER — Ambulatory Visit: Payer: Managed Care, Other (non HMO) | Admitting: Adult Health

## 2013-10-28 ENCOUNTER — Ambulatory Visit: Payer: Managed Care, Other (non HMO) | Admitting: Pulmonary Disease

## 2013-11-03 ENCOUNTER — Encounter (HOSPITAL_BASED_OUTPATIENT_CLINIC_OR_DEPARTMENT_OTHER): Payer: Self-pay | Admitting: Emergency Medicine

## 2013-11-03 ENCOUNTER — Ambulatory Visit (HOSPITAL_COMMUNITY): Payer: Managed Care, Other (non HMO)

## 2013-11-03 ENCOUNTER — Emergency Department (HOSPITAL_BASED_OUTPATIENT_CLINIC_OR_DEPARTMENT_OTHER): Payer: Managed Care, Other (non HMO)

## 2013-11-03 ENCOUNTER — Ambulatory Visit (HOSPITAL_COMMUNITY)
Admit: 2013-11-03 | Discharge: 2013-11-03 | Disposition: A | Discharge status: Home / Self Care | Payer: Managed Care, Other (non HMO) | Attending: Emergency Medicine | Admitting: Emergency Medicine

## 2013-11-03 ENCOUNTER — Emergency Department (HOSPITAL_BASED_OUTPATIENT_CLINIC_OR_DEPARTMENT_OTHER)
Admission: EM | Admit: 2013-11-03 | Discharge: 2013-11-03 | Disposition: A | Discharge status: Home / Self Care | Payer: Managed Care, Other (non HMO) | Attending: Emergency Medicine | Admitting: Emergency Medicine

## 2013-11-03 DIAGNOSIS — Z79899 Other long term (current) drug therapy: Secondary | ICD-10-CM | POA: Insufficient documentation

## 2013-11-03 DIAGNOSIS — M25562 Pain in left knee: Secondary | ICD-10-CM

## 2013-11-03 DIAGNOSIS — M79609 Pain in unspecified limb: Secondary | ICD-10-CM | POA: Insufficient documentation

## 2013-11-03 DIAGNOSIS — Z87891 Personal history of nicotine dependence: Secondary | ICD-10-CM | POA: Insufficient documentation

## 2013-11-03 DIAGNOSIS — M79605 Pain in left leg: Secondary | ICD-10-CM

## 2013-11-03 DIAGNOSIS — M7989 Other specified soft tissue disorders: Secondary | ICD-10-CM

## 2013-11-03 DIAGNOSIS — J45901 Unspecified asthma with (acute) exacerbation: Secondary | ICD-10-CM | POA: Insufficient documentation

## 2013-11-03 DIAGNOSIS — M129 Arthropathy, unspecified: Secondary | ICD-10-CM | POA: Insufficient documentation

## 2013-11-03 DIAGNOSIS — Z8669 Personal history of other diseases of the nervous system and sense organs: Secondary | ICD-10-CM | POA: Insufficient documentation

## 2013-11-03 DIAGNOSIS — I10 Essential (primary) hypertension: Secondary | ICD-10-CM | POA: Insufficient documentation

## 2013-11-03 DIAGNOSIS — IMO0002 Reserved for concepts with insufficient information to code with codable children: Secondary | ICD-10-CM | POA: Insufficient documentation

## 2013-11-03 LAB — BASIC METABOLIC PANEL
BUN: 11 mg/dL (ref 6–23)
CALCIUM: 8.7 mg/dL (ref 8.4–10.5)
CO2: 27 mEq/L (ref 19–32)
CREATININE: 0.9 mg/dL (ref 0.50–1.10)
Chloride: 105 mEq/L (ref 96–112)
GFR, EST AFRICAN AMERICAN: 86 mL/min — AB (ref >90)
GFR, EST NON AFRICAN AMERICAN: 74 mL/min — AB (ref >90)
Glucose, Bld: 79 mg/dL (ref 70–99)
Potassium: 4 mEq/L (ref 3.7–5.3)
Sodium: 141 mEq/L (ref 137–147)

## 2013-11-03 LAB — CBC WITH DIFFERENTIAL/PLATELET
BASOS PCT: 1 % (ref 0–1)
Basophils Absolute: 0.1 10*3/uL (ref 0.0–0.1)
EOS ABS: 0.3 10*3/uL (ref 0.0–0.7)
EOS PCT: 5 % (ref 0–5)
HCT: 37.8 % (ref 36.0–46.0)
HEMOGLOBIN: 12.4 g/dL (ref 12.0–15.0)
Lymphocytes Relative: 51 % — ABNORMAL HIGH (ref 12–46)
Lymphs Abs: 2.9 10*3/uL (ref 0.7–4.0)
MCH: 28.2 pg (ref 26.0–34.0)
MCHC: 32.8 g/dL (ref 30.0–36.0)
MCV: 86.1 fL (ref 78.0–100.0)
MONO ABS: 0.6 10*3/uL (ref 0.1–1.0)
MONOS PCT: 10 % (ref 3–12)
Neutro Abs: 2 10*3/uL (ref 1.7–7.7)
Neutrophils Relative %: 34 % — ABNORMAL LOW (ref 43–77)
Platelets: 293 10*3/uL (ref 150–400)
RBC: 4.39 MIL/uL (ref 3.87–5.11)
RDW: 14.9 % (ref 11.5–15.5)
WBC: 5.8 10*3/uL (ref 4.0–10.5)

## 2013-11-03 LAB — D-DIMER, QUANTITATIVE: D-Dimer, Quant: 0.33 ug/mL-FEU (ref 0.00–0.48)

## 2013-11-03 MED ORDER — HYDROCODONE-ACETAMINOPHEN 5-325 MG PO TABS: 1.0000 | ORAL_TABLET | Freq: Four times a day (QID) | ORAL | Status: DC | PRN

## 2013-11-03 MED ORDER — PREDNISONE 20 MG PO TABS: ORAL_TABLET | ORAL | Status: DC

## 2013-11-03 MED ORDER — PREDNISONE 50 MG PO TABS
60.0000 mg | ORAL_TABLET | Freq: Once | ORAL | Status: AC
  Administered 2013-11-03: 12:00:00 60 mg via ORAL
  Filled 2013-11-03 (×2): qty 1

## 2013-11-03 MED ORDER — ALBUTEROL SULFATE (2.5 MG/3ML) 0.083% IN NEBU
2.5000 mg | INHALATION_SOLUTION | Freq: Once | RESPIRATORY_TRACT | Status: AC
  Administered 2013-11-03: 2.5 mg via RESPIRATORY_TRACT
  Filled 2013-11-03: qty 3

## 2013-11-03 MED ORDER — ENOXAPARIN SODIUM 120 MG/0.8ML ~~LOC~~ SOLN
1.0000 mg/kg | Freq: Once | SUBCUTANEOUS | Status: AC
  Administered 2013-11-03: 120 mg via SUBCUTANEOUS
  Filled 2013-11-03: qty 0.8

## 2013-11-03 MED ORDER — HYDROCODONE-ACETAMINOPHEN 5-325 MG PO TABS
1.0000 | ORAL_TABLET | Freq: Once | ORAL | Status: AC
Start: 2013-11-03 — End: 2013-11-03
  Administered 2013-11-03: 1 via ORAL
  Filled 2013-11-03: qty 1

## 2013-11-03 NOTE — Progress Notes (Signed)
VASCULAR LAB PRELIMINARY  PRELIMINARY  PRELIMINARY  PRELIMINARY  Left lower extremity venous Doppler completed.    Preliminary report:  There is no DVT or SVT noted in the left lower extremity.  Orien Mayhall, RVT 11/03/2013, 2:24 PM

## 2013-11-03 NOTE — ED Notes (Signed)
Patient c/o L lower leg pain for the last three days, has been taking tylenol and elevating leg but no relief. Also c/o chest congestion and states her inhaler is not as effective in opening her airway, productive cough

## 2013-11-03 NOTE — ED Provider Notes (Signed)
CSN: [id number]     Arrival date & time 11/03/13  0907 History   First MD Initiated Contact with Patient 11/03/13 0932     Chief Complaint  Patient presents with  . Ankle Pain   (Consider location/radiation/quality/duration/timing/severity/associated sxs/prior Treatment) Patient is a 48 y.o. female presenting with ankle pain and shortness of breath. The history is provided by the patient. No language interpreter was used.  Ankle Pain Location:  Ankle, foot and leg Injury: no   Leg location:  L lower leg Ankle location:  L ankle Pain details:    Quality:  Aching   Radiates to: up leg.   Severity:  Moderate   Onset quality:  Gradual   Duration:  2 weeks   Timing:  Constant   Progression:  Worsening Chronicity:  New Dislocation: no   Foreign body present:  No foreign bodies Tetanus status:  Unknown Prior injury to area:  No Relieved by:  Rest and elevation Worsened by:  Bearing weight Ineffective treatments:  None tried Associated symptoms: swelling   Associated symptoms: no back pain, no decreased ROM, no fatigue, no fever, no muscle weakness, no neck pain, no numbness, no stiffness and no tingling   Risk factors: obesity   Risk factors: no frequent fractures   Shortness of Breath Severity:  Moderate Onset quality:  Gradual Duration:  3 days Timing:  Intermittent Progression:  Waxing and waning Chronicity:  Recurrent Relieved by:  Rest Worsened by:  Exertion Ineffective treatments:  None tried Associated symptoms: cough and sputum production   Associated symptoms: no abdominal pain, no chest pain, no diaphoresis, no fever, no headaches, no neck pain, no sore throat and no vomiting   Cough:    Cough characteristics:  Productive   Severity:  Moderate   Duration:  3 days   Timing:  Intermittent   Progression:  Waxing and waning   Chronicity:  New Risk factors: obesity   Risk factors: no family hx of DVT, no hx of cancer, no hx of PE/DVT, no oral contraceptive use, no  prolonged immobilization and no tobacco use     Past Medical History  Diagnosis Date  . Arthritis   . Asthma   . Hypertension   . Influenza B April 2014    Complicated by multi-organ failure  . 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 OSA (obstructive sleep apnea) 03/20/2013   Past Surgical History  Procedure Laterality Date  . Tubal ligation    . Cesarean section     No family history on file. History  Substance Use Topics  . Smoking status: Former Smoker -- 0.25 packs/day for 20 years    Types: Cigarettes    Quit date: 01/31/2013  . Smokeless tobacco: Never Used  . Alcohol Use: No   OB History   Grav Para Term Preterm Abortions TAB SAB Ect Mult Living                 Review of Systems  Constitutional: Negative for fever, chills, diaphoresis, activity change, appetite change and fatigue.  HENT: Negative for congestion, facial swelling, rhinorrhea and sore throat.   Eyes: Negative for photophobia and discharge.  Respiratory: Positive for cough, sputum production and shortness of breath. Negative for chest tightness.   Cardiovascular: Negative for chest pain, palpitations and leg swelling.  Gastrointestinal: Negative for nausea, vomiting, abdominal pain and diarrhea.  Endocrine: Negative for polydipsia and polyuria.  Genitourinary: Negative for dysuria, frequency, difficulty urinating and pelvic pain.  Musculoskeletal: Negative for arthralgias,  back pain, neck pain, neck stiffness and stiffness.  Skin: Negative for color change and wound.  Allergic/Immunologic: Negative for immunocompromised state.  Neurological: Negative for facial asymmetry, weakness, numbness and headaches.  Hematological: Does not bruise/bleed easily.  Psychiatric/Behavioral: Negative for confusion and agitation.    Allergies  Other and Tomato  Home Medications   Current Outpatient Rx  Name  Route  Sig  Dispense  Refill  . albuterol (PROVENTIL) (2.5 MG/3ML) 0.083% nebulizer solution    Nebulization   Take 3 mLs (2.5 mg total) by nebulization every 6 (six) hours as needed for wheezing. DX 493.92   120 mL   2   . albuterol (VENTOLIN HFA) 108 (90 BASE) MCG/ACT inhaler   Inhalation   Inhale 2 puffs into the lungs every 6 (six) hours as needed for wheezing.   1 Inhaler   0   . budesonide-formoterol (SYMBICORT) 160-4.5 MCG/ACT inhaler   Inhalation   Inhale 2 puffs into the lungs 2 (two) times daily.   2 Inhaler   0   . diltiazem (CARDIZEM CD) 240 MG 24 hr capsule   Oral   Take 1 capsule (240 mg total) by mouth daily.   30 capsule   0   . ferrous fumarate (HEMOCYTE - 106 MG FE) 325 (106 FE) MG TABS   Oral   Take 1 tablet by mouth 2 (two) times daily. Takes every other other day         . fluticasone (FLONASE) 50 MCG/ACT nasal spray   Nasal   Place 2 sprays into the nose daily.   16 g   0   . HYDROcodone-acetaminophen (NORCO) 5-325 MG per tablet   Oral   Take 1 tablet by mouth every 6 (six) hours as needed.   10 tablet   0   . losartan (COZAAR) 100 MG tablet   Oral   Take 1 tablet (100 mg total) by mouth daily.   30 tablet   11   . losartan (COZAAR) 100 MG tablet   Oral   Take 1 tablet (100 mg total) by mouth daily.   90 tablet   3   . mometasone (NASONEX) 50 MCG/ACT nasal spray   Nasal   Place 2 sprays into the nose daily.   17 g   11   . predniSONE (DELTASONE) 20 MG tablet   Oral   Take 3 tablets (60 mg total) by mouth daily with breakfast.   15 tablet   0   . predniSONE (DELTASONE) 20 MG tablet      3 tabs po day one, then 2 po daily x 4 days   11 tablet   0   . Spacer/Aero-Holding Chambers (AEROCHAMBER Z-STAT PLUS) inhaler      Use as instructed   1 each   0   . Vitamin D, Ergocalciferol, (DRISDOL) 50000 UNITS CAPS   Oral   Take 50,000 Units by mouth every 7 (seven) days.          BP 135/80  Pulse 76  Temp(Src) 97.4 F (36.3 C) (Oral)  Resp 20  Ht 5\' 6"  (1.676 m)  Wt 267 lb (121.11 kg)  BMI 43.12 kg/m2  SpO2  99%  LMP 10/31/2013 Physical Exam  Constitutional: She is oriented to person, place, and time. She appears well-developed and well-nourished. No distress.  HENT:  Head: Normocephalic and atraumatic.  Mouth/Throat: No oropharyngeal exudate.  Eyes: Pupils are equal, round, and reactive to light.  Neck: Normal range of  motion. Neck supple.  Cardiovascular: Normal rate, regular rhythm and normal heart sounds.  Exam reveals no gallop and no friction rub.   No murmur heard. Pulmonary/Chest: Effort normal and breath sounds normal. No respiratory distress. She has no wheezes. She has no rales.  Abdominal: Soft. Bowel sounds are normal. She exhibits no distension and no mass. There is no tenderness. There is no rebound and no guarding.  Musculoskeletal: Normal range of motion. She exhibits edema (BLLE pitting edema, L>R.  TTP over L ankle and TA muscle). She exhibits no tenderness.  Neurological: She is alert and oriented to person, place, and time.  Skin: Skin is warm and dry.  Psychiatric: She has a normal mood and affect.    ED Course  Procedures (including critical care time) Labs Review Labs Reviewed  CBC WITH DIFFERENTIAL - Abnormal; Notable for the following:    Neutrophils Relative % 34 (*)    Lymphocytes Relative 51 (*)    All other components within normal limits  BASIC METABOLIC PANEL - Abnormal; Notable for the following:    GFR calc non Af Amer 74 (*)    GFR calc Af Amer 86 (*)    All other components within normal limits  D-DIMER, QUANTITATIVE   Imaging Review Dg Chest 2 View  11/03/2013   CLINICAL DATA:  Cough, congestion, productive cough  EXAM: CHEST  2 VIEW  COMPARISON:  03/22/2013  FINDINGS: Cardiomediastinal silhouette is stable. No acute infiltrate or pleural effusion. No pulmonary edema. Central mild bronchitic changes. Bony thorax is unremarkable.  IMPRESSION: No acute infiltrate or pulmonary edema. Central mild bronchitic changes.   Electronically Signed   By: Natasha MeadLiviu   Pop M.D.   On: 11/03/2013 10:28   Dg Ankle Complete Left  11/03/2013   CLINICAL DATA:  Ankle pain  EXAM: LEFT ANKLE COMPLETE - 3+ VIEW  COMPARISON:  None.  FINDINGS: Three views of left ankle submitted. No acute fracture or subluxation. Mild soft tissue swelling adjacent to medial malleolus. Ankle mortise is preserved. Tiny plantar spur of calcaneus.  IMPRESSION: No acute fracture or subluxation. Mild soft tissue swelling adjacent to medial malleolus.   Electronically Signed   By: Natasha MeadLiviu  Pop M.D.   On: 11/03/2013 10:27    EKG Interpretation   None       MDM   1. Asthma exacerbation   2. Left leg pain    Pt is a 48 y.o. female with Pmhx as above who presents with about 2 weeks of LLE pain, worse for past 2 days, with swelling of L ankle & lower leg w/o clear hx of injury.  She also reports 2-3 days of increased asthma symptoms, with SOB, wheezing, productive cough.  NO fever, +chills.  No n/v, d/a.  No CP, no hemoptysis.  NO hx of PE/DVT, no tob or estrogen use, no immobilization. On PE, VSS, pt in NAD. She has scattered wheezing throughout.  +BLLE pitting edema, L>R with +ttp over lower leg. XR ankle w/o bony injury. CXR w/ mild bronchitis changes. CBC, BMP, d-dimer unremarkable.  I doubt PE given nml HR, O2 sat, nml d-dimer, but cannot be sure pt does not have DVT w/o US. [facility] lab.          Shanna CiscoMegan E Docherty, MD 11/04/13 [phone]

## 2013-11-03 NOTE — Discharge Instructions (Signed)
Asthma, Adult Asthma is a condition of the lungs in which the airways tighten and narrow. Asthma can make it hard to breathe. Asthma cannot be cured, but medicine and lifestyle changes can help control it. Asthma may be started (triggered) by:  Animal skin flakes (dander).  Dust.  Cockroaches.  Pollen.  Mold.  Smoke.  Cleaning products.  Hair sprays or aerosol sprays.  Paint fumes or strong smells.  Cold air, weather changes, and winds.  Crying or laughing hard.  Stress.  Certain medicines or drugs.  Foods, such as dried fruit, potato chips, and sparkling grape juice.  Infections or conditions (colds, flu).  Exercise.  Certain medical conditions or diseases.  Exercise or tiring activities. HOME CARE   Take medicine as told by your doctor.  Use a peak flow meter as told by your doctor. A peak flow meter is a tool that measures how well the lungs are working.  Record and keep track of the peak flow meter's readings.  Understand and use the asthma action plan. An asthma action plan is a written plan for taking care of your asthma and treating your attacks.  To help prevent asthma attacks:  Do not smoke. Stay away from secondhand smoke.  Change your heating and air conditioning filter often.  Limit your use of fireplaces and wood stoves.  Get rid of pests (such as roaches and mice) and their droppings.  Throw away plants if you see mold on them.  Clean your floors. Dust regularly. Use cleaning products that do not smell.  Have someone vacuum when you are not home. Use a vacuum cleaner with a HEPA filter if possible.  Replace carpet with wood, tile, or vinyl flooring. Carpet can trap animal skin flakes and dust.  Use allergy-proof pillows, mattress covers, and box spring covers.  Wash bed sheets and blankets every week in hot water and dry them in a dryer.  Use blankets that are made of polyester or cotton.  Clean bathrooms and kitchens with bleach.  If possible, have someone repaint the walls in these rooms with mold-resistant paint. Keep out of the rooms that are being cleaned and painted.  Wash hands often. GET HELP IF:  You have make a whistling sound when breaking (wheeze), have shortness of breath, or have a cough even if taking medicine to prevent attacks.  The colored mucus you cough up (sputum) is thicker than usual.  The colored mucus you cough up changes from clear or white to yellow, green, gray, or bloody.  You have problems from the medicine you are taking such as:  A rash.  Itching.  Swelling.  Trouble breathing.  You need reliever medicines more than 2 3 times a week.  Your peak flow measurement is still at 50 79% of your personal best after following the action plan for 1 hour. GET HELP RIGHT AWAY IF:   You seem to be worse and are not responding to medicine during an asthma attack.  You are short of breath even at rest.  You get short of breath when doing very little activity.  You have trouble eating, drinking, or talking.  You have chest pain.  You have a fast heartbeat.  Your lips or fingernails start to turn blue.  You are lightheaded, dizzy, or faint.  Your peak flow is less than 50% of your personal best.  You have a fever or lasting symptoms for more than 2 3 days.  You have a fever and your symptoms suddenly  get worse. MAKE SURE YOU:   Understand these instructions.  Will watch your condition.  Will get help right away if you are not doing well or get worse. Document Released: 03/28/2008 Document Revised: 07/31/2013 Document Reviewed: 05/09/2013 [facility] Patient Information 2014 [place], [place]. Deep Vein Thrombosis A deep vein thrombosis (DVT) is a blood clot that develops in the deep, larger veins of the leg, arm, or pelvis. These are more dangerous than clots that might form in veins near the surface of the body. A DVT can lead to complications if the clot breaks off and  travels in the bloodstream to the lungs.  A DVT can damage the valves in your leg veins, so that instead of flowing upward, the blood pools in the lower leg. This is called post-thrombotic syndrome, and it can result in pain, swelling, discoloration, and sores on the leg. CAUSES Usually, several things contribute to blood clots forming. Contributing factors include:  The flow of blood slows down.  The inside of the vein is damaged in some way.  You have a condition that makes blood clot more easily. RISK FACTORS Some people are more likely than others to develop blood clots. Risk factors include:   Older age, especially over 49 years of age.  Having a family history of blood clots or if you have already had a blot clot.  Having major or lengthy surgery. This is especially true for surgery on the hip, knee, or belly (abdomen). Hip surgery is particularly high risk.  Breaking a hip or leg.  Sitting or lying still for a long time. This includes long-distance travel, paralysis, or recovery from an illness or surgery.  Having cancer or cancer treatment.  Having a long, thin tube (catheter) placed inside a vein during a medical procedure.  Being overweight (obese).  Pregnancy and childbirth.  Hormone changes make the blood clot more easily during pregnancy.  The fetus puts pressure on the veins of the pelvis.  There is a risk of injury to veins during delivery or a caesarean. The risk is highest just after childbirth.  Medicines with the female hormone estrogen. This includes birth control pills and hormone replacement therapy.  Smoking.  Other circulation or heart problems.  SIGNS AND SYMPTOMS When a clot forms, it can either partially or totally block the blood flow in that vein. Symptoms of a DVT can include:  Swelling of the leg or arm, especially if one side is much worse.  Warmth and redness of the leg or arm, especially if one side is much worse.  Pain in an arm or  leg. If the clot is in the leg, symptoms may be more noticeable or worse when standing or walking. The symptoms of a DVT that has traveled to the lungs (pulmonary embolism, PE) usually start suddenly and include:  Shortness of breath.  Coughing.  Coughing up blood or blood-tinged phlegm.  Chest pain. The chest pain is often worse with deep breaths.  Rapid heartbeat. Anyone with these symptoms should get emergency medical treatment right away. Call your local emergency services (911 in the [place] if you have these symptoms. DIAGNOSIS If a DVT is suspected, your health care provider will take a full medical history and perform a physical exam. Tests that also may be required include:  Blood tests, including studies of the clotting properties of the blood.  Ultrasonography to see if you have clots in your legs or lungs.  X-rays to show the flow of blood when dye is  injected into the veins (venography).  Studies of your lungs if you have any chest symptoms. PREVENTION  Exercise the legs regularly. Take a brisk 30-minute walk every day.  Maintain a weight that is appropriate for your height.  Avoid sitting or lying in bed for long periods of time without moving your legs.  Women, particularly those over the age of 35 years, should consider the risks and benefits of taking estrogen medicines, including birth control pills.  Do not smoke, especially if you take estrogen medicines.  Long-distance travel can increase your risk of DVT. You should exercise your legs by walking or pumping the muscles every hour.  In-hospital prevention:  Many of the risk factors above relate to situations that exist with hospitalization, either for illness, injury, or elective surgery.  Your health care provider will assess you for the need for venous thromboembolism prophylaxis when you are admitted to the hospital. If you are having surgery, your surgeon will assess you the day of or day after  surgery.  Prevention may include medical and nonmedical measures. TREATMENT Once identified, a DVT can be treated. It can also be prevented in some circumstances. Once you have had a DVT, you may be at increased risk for a DVT in the future. The most common treatment for DVT is blood thinning (anticoagulant) medicine, which reduces the blood's tendency to clot. Anticoagulants can stop new blood clots from forming and stop old ones from growing. They cannot dissolve existing clots. Your body does this by itself over time. Anticoagulants can be given by mouth, by IV access, or by injection. Your health care provider will determine the best program for you. Other medicines or treatments that may be used are:  Heparin or related medicines (low molecular weight heparin) are usually the first treatment for a blood clot. They act quickly. However, they cannot be taken orally.  Heparin can cause a fall in a component of blood that stops bleeding and forms blood clots (platelets). You will be monitored with blood tests to be sure this does not occur.  Warfarin is an anticoagulant that can be swallowed. It takes a few days to start working, so usually heparin or related medicines are used in combination. Once warfarin is working, heparin is usually stopped.  Less commonly, clot dissolving drugs (thrombolytics) are used to dissolve a DVT. They carry a high risk of bleeding, so they are used mainly in severe cases, where your life or a limb is threatened.  Very rarely, a blood clot in the leg needs to be removed surgically.  If you are unable to take anticoagulants, your health care provider may arrange for you to have a filter placed in a main vein in your abdomen. This filter prevents clots from traveling to your lungs. HOME CARE INSTRUCTIONS  Take all medicines prescribed by your health care provider. Only take over-the-counter or prescription medicines for pain, fever, or discomfort as directed by your  health care provider.  Warfarin. Most people will continue taking warfarin after hospital discharge. Your health care provider will advise you on the length of treatment (usually 3 6 months, sometimes lifelong).  Too much and too little warfarin are both dangerous. Too much warfarin increases the risk of bleeding. Too little warfarin continues to allow the risk for blood clots. While taking warfarin, you will need to have regular blood tests to measure your blood clotting time. These blood tests usually include both the prothrombin time (PT) and international normalized ratio (INR) tests. The  PT and INR results allow your health care provider to adjust your dose of warfarin. The dose can change for many reasons. It is critically important that you take warfarin exactly as prescribed, and that you have your PT and INR levels drawn exactly as directed.  Many foods, especially foods high in vitamin K, can interfere with warfarin and affect the PT and INR results. Foods high in vitamin K include spinach, kale, broccoli, cabbage, collard and turnip greens, brussel sprouts, peas, cauliflower, seaweed, and parsley as well as beef and pork liver, green tea, and soybean oil. You should eat a consistent amount of foods high in vitamin K. Avoid major changes in your diet, or notify your health care provider before changing your diet. Arrange a visit with a dietitian to answer your questions.  Many medicines can interfere with warfarin and affect the PT and INR results. You must tell your health care provider about any and all medicines you take. This includes all vitamins and supplements. Be especially cautious with aspirin and anti-inflammatory medicines. Ask your health care provider before taking these. Do not take or discontinue any prescribed or over-the-counter medicine except on the advice of your health care provider or pharmacist.  Warfarin can have side effects, primarily excessive bruising or bleeding. You  will need to hold pressure over cuts for longer than usual. Your health care provider or pharmacist will discuss other potential side effects.  Alcohol can change the body's ability to handle warfarin. It is best to avoid alcoholic drinks or consume only very small amounts while taking warfarin. Notify your health care provider if you change your alcohol intake.  Notify your dentist or other health care providers before procedures.  Activity. Ask your health care provider how soon you can go back to normal activities. It is important to stay active to prevent blood clots. If you are on anticoagulant medicine, avoid contact sports.  Exercise. It is very important to exercise. This is especially important while traveling, sitting, or standing for long periods of time. Exercise your legs by walking or by pumping the muscles frequently. Take frequent walks.  Compression stockings. These are tight elastic stockings that apply pressure to the lower legs. This pressure can help keep the blood in the legs from clotting. You may need to wear compression stockings at home to help prevent a DVT.  Do not smoke. If you smoke, quit. Ask your health care provider for help with quitting smoking.  Learn as much as you can about DVT. Knowing more about the condition should help you keep it from coming back.  Wear a medical alert bracelet or carry a medical alert card. SEEK MEDICAL CARE IF:  You notice a rapid heartbeat.  You feel weaker or more tired than usual.  You feel faint.  You notice increased bruising.  You feel your symptoms are not getting better in the time expected.  You believe you are having side effects of medicine. SEEK IMMEDIATE MEDICAL CARE IF:  You have chest pain.  You have trouble breathing.  You have new or increased swelling or pain in one leg.  You cough up blood.  You notice blood in vomit, in a bowel movement, or in urine. MAKE SURE YOU:  Understand these  instructions.  Will watch your condition.  Will get help right away if you are not doing well or get worse. Document Released: 10/10/2005 Document Revised: 07/31/2013 Document Reviewed: 06/17/2013 [facility] Patient Information 2014 [place], [place].

## 2013-11-04 ENCOUNTER — Other Ambulatory Visit (HOSPITAL_COMMUNITY): Payer: Self-pay | Admitting: Emergency Medicine

## 2013-11-04 DIAGNOSIS — M7989 Other specified soft tissue disorders: Secondary | ICD-10-CM

## 2013-11-04 DIAGNOSIS — M25562 Pain in left knee: Secondary | ICD-10-CM

## 2013-12-20 ENCOUNTER — Telehealth: Payer: Self-pay | Admitting: Pulmonary Disease

## 2013-12-20 ENCOUNTER — Ambulatory Visit: Payer: Managed Care, Other (non HMO) | Admitting: Adult Health

## 2013-12-20 NOTE — Telephone Encounter (Signed)
Spoke with the pt  She is requesting pred taper Having increased SOB  I advised will require ov Pt overdue for rov here and has already been to ED once since then with asthma flare Pt was scheduled to see TP at 4:30 pm today

## 2013-12-23 ENCOUNTER — Ambulatory Visit: Payer: Managed Care, Other (non HMO) | Admitting: Adult Health

## 2013-12-24 ENCOUNTER — Other Ambulatory Visit: Payer: Self-pay | Admitting: Pulmonary Disease

## 2013-12-24 MED ORDER — LOSARTAN POTASSIUM 100 MG PO TABS: 100.0000 mg | ORAL_TABLET | Freq: Every day | ORAL | Status: DC

## 2013-12-26 ENCOUNTER — Other Ambulatory Visit: Payer: Self-pay | Admitting: *Deleted

## 2014-01-06 ENCOUNTER — Telehealth: Payer: Self-pay | Admitting: Pulmonary Disease

## 2014-01-06 MED ORDER — LOSARTAN POTASSIUM 100 MG PO TABS: 100.0000 mg | ORAL_TABLET | Freq: Every day | ORAL | Status: DC

## 2014-01-06 NOTE — Telephone Encounter (Signed)
Pt is needing a 30 day supply of Losartan sent to her pharmacy. On 12/24/13 I sent in a 90 day supply due to fax from [facility] stating that the pt was wanting 90 days. Per the pt, this is going to cost to much money. 30 day supply has been sent in. Nothing further is needed.

## 2014-03-02 ENCOUNTER — Emergency Department (HOSPITAL_BASED_OUTPATIENT_CLINIC_OR_DEPARTMENT_OTHER): Payer: Managed Care, Other (non HMO)

## 2014-03-02 ENCOUNTER — Emergency Department (HOSPITAL_BASED_OUTPATIENT_CLINIC_OR_DEPARTMENT_OTHER)
Admission: EM | Admit: 2014-03-02 | Discharge: 2014-03-02 | Disposition: A | Discharge status: Home / Self Care | Payer: Managed Care, Other (non HMO) | Attending: Emergency Medicine | Admitting: Emergency Medicine

## 2014-03-02 ENCOUNTER — Encounter (HOSPITAL_BASED_OUTPATIENT_CLINIC_OR_DEPARTMENT_OTHER): Payer: Self-pay | Admitting: Emergency Medicine

## 2014-03-02 DIAGNOSIS — J45901 Unspecified asthma with (acute) exacerbation: Secondary | ICD-10-CM | POA: Insufficient documentation

## 2014-03-02 DIAGNOSIS — Z8669 Personal history of other diseases of the nervous system and sense organs: Secondary | ICD-10-CM | POA: Insufficient documentation

## 2014-03-02 DIAGNOSIS — I10 Essential (primary) hypertension: Secondary | ICD-10-CM | POA: Insufficient documentation

## 2014-03-02 DIAGNOSIS — R091 Pleurisy: Secondary | ICD-10-CM | POA: Insufficient documentation

## 2014-03-02 DIAGNOSIS — Z87891 Personal history of nicotine dependence: Secondary | ICD-10-CM | POA: Insufficient documentation

## 2014-03-02 DIAGNOSIS — Z79899 Other long term (current) drug therapy: Secondary | ICD-10-CM | POA: Insufficient documentation

## 2014-03-02 DIAGNOSIS — IMO0002 Reserved for concepts with insufficient information to code with codable children: Secondary | ICD-10-CM | POA: Insufficient documentation

## 2014-03-02 DIAGNOSIS — Z8739 Personal history of other diseases of the musculoskeletal system and connective tissue: Secondary | ICD-10-CM | POA: Insufficient documentation

## 2014-03-02 LAB — BASIC METABOLIC PANEL
BUN: 15 mg/dL (ref 6–23)
CHLORIDE: 107 meq/L (ref 96–112)
CO2: 24 mEq/L (ref 19–32)
Calcium: 9.1 mg/dL (ref 8.4–10.5)
Creatinine, Ser: 1.1 mg/dL (ref 0.50–1.10)
GFR calc Af Amer: 68 mL/min — ABNORMAL LOW (ref >90)
GFR calc non Af Amer: 58 mL/min — ABNORMAL LOW (ref >90)
Glucose, Bld: 89 mg/dL (ref 70–99)
Potassium: 3.7 mEq/L (ref 3.7–5.3)
Sodium: 144 mEq/L (ref 137–147)

## 2014-03-02 LAB — TROPONIN I

## 2014-03-02 LAB — CBC WITH DIFFERENTIAL/PLATELET
BASOS ABS: 0 10*3/uL (ref 0.0–0.1)
Basophils Relative: 1 % (ref 0–1)
Eosinophils Absolute: 0.4 10*3/uL (ref 0.0–0.7)
Eosinophils Relative: 7 % — ABNORMAL HIGH (ref 0–5)
HCT: 39.6 % (ref 36.0–46.0)
Hemoglobin: 13.4 g/dL (ref 12.0–15.0)
LYMPHS ABS: 2.3 10*3/uL (ref 0.7–4.0)
Lymphocytes Relative: 39 % (ref 12–46)
MCH: 28.9 pg (ref 26.0–34.0)
MCHC: 33.8 g/dL (ref 30.0–36.0)
MCV: 85.5 fL (ref 78.0–100.0)
MONO ABS: 0.8 10*3/uL (ref 0.1–1.0)
MONOS PCT: 13 % — AB (ref 3–12)
NEUTROS ABS: 2.4 10*3/uL (ref 1.7–7.7)
Neutrophils Relative %: 40 % — ABNORMAL LOW (ref 43–77)
Platelets: 296 10*3/uL (ref 150–400)
RBC: 4.63 MIL/uL (ref 3.87–5.11)
RDW: 16.3 % — ABNORMAL HIGH (ref 11.5–15.5)
WBC: 6 10*3/uL (ref 4.0–10.5)

## 2014-03-02 LAB — PRO B NATRIURETIC PEPTIDE: Pro B Natriuretic peptide (BNP): 32.5 pg/mL (ref 0–125)

## 2014-03-02 MED ORDER — METHYLPREDNISOLONE SODIUM SUCC 125 MG IJ SOLR
125.0000 mg | Freq: Once | INTRAMUSCULAR | Status: AC
  Administered 2014-03-02: 125 mg via INTRAVENOUS
  Filled 2014-03-02: qty 2

## 2014-03-02 MED ORDER — ALBUTEROL SULFATE (2.5 MG/3ML) 0.083% IN NEBU
5.0000 mg | INHALATION_SOLUTION | Freq: Once | RESPIRATORY_TRACT | Status: AC
  Administered 2014-03-02: 5 mg via RESPIRATORY_TRACT

## 2014-03-02 MED ORDER — PREDNISONE 20 MG PO TABS: 60.0000 mg | ORAL_TABLET | Freq: Every day | ORAL | Status: DC

## 2014-03-02 MED ORDER — ALBUTEROL SULFATE (2.5 MG/3ML) 0.083% IN NEBU
INHALATION_SOLUTION | RESPIRATORY_TRACT | Status: AC
  Filled 2014-03-02: qty 6

## 2014-03-02 MED ORDER — ALBUTEROL (5 MG/ML) CONTINUOUS INHALATION SOLN
10.0000 mg/h | INHALATION_SOLUTION | RESPIRATORY_TRACT | Status: DC
  Administered 2014-03-02: 10 mg/h via RESPIRATORY_TRACT
  Filled 2014-03-02: qty 20

## 2014-03-02 NOTE — ED Notes (Signed)
Sob since last night, using accessory muscles.  Wheezing throughout.  Pt having difficulty catching breath.

## 2014-03-02 NOTE — ED Notes (Addendum)
Pt reports breathing has improved since arrival and receiving resp tx but remains dyspneic and audible wheezing noted

## 2014-03-02 NOTE — ED Provider Notes (Signed)
CSN: [id number]     Arrival date & time 03/02/14  1703 History  This chart was scribed for Adorian Gwynne B. Bernette MayersSheldon, MD by Nicholos Johnsenise Iheanachor, ED scribe. This patient was seen in room MH02/MH02 and the patient's care was started at 5:23 PM.    Chief Complaint  Patient presents with  . Shortness of Breath  . Pleurisy   The history is provided by the patient. No language interpreter was used.   HPI Comments: Kathleen Rodriguez is a 48 y.o. female who presents to the Emergency Department complaining of SOB w/ associated wheezing; onset last night. States SOB went away once she laid down. SOB worse when standing and moving. Reports chest pain that began a few hours ago. Says most likely a result of persistent coughing that also began a few hours ago. Reports a similar incident occurring in 2014 in which pt suffered respiratory failure requiring hospitalization and intubation. Pt was discharged 3.5 weeks later.  Past Medical History  Diagnosis Date  . Arthritis   . Asthma   . Hypertension   . Influenza B April 2014    Complicated by multi-organ failure  . 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 OSA (obstructive sleep apnea) 03/20/2013   Past Surgical History  Procedure Laterality Date  . Tubal ligation    . Cesarean section     No family history on file. History  Substance Use Topics  . Smoking status: Former Smoker -- 0.25 packs/day for 20 years    Types: Cigarettes    Quit date: 01/31/2013  . Smokeless tobacco: Never Used  . Alcohol Use: No   OB History   Grav Para Term Preterm Abortions TAB SAB Ect Mult Living                 Review of Systems  Respiratory: Positive for cough, shortness of breath and wheezing.    A complete 10 system review of systems was obtained and all systems are negative except as noted in the HPI and PMH.  Allergies  Other and Tomato  Home Medications   Prior to Admission medications   Medication Sig Start Date End Date Taking? Authorizing Provider   albuterol (PROVENTIL) (2.5 MG/3ML) 0.083% nebulizer solution Take 3 mLs (2.5 mg total) by nebulization every 6 (six) hours as needed for wheezing. DX 493.92 07/16/13   Coralyn HellingVineet Sood, MD  albuterol (VENTOLIN HFA) 108 (90 BASE) MCG/ACT inhaler Inhale 2 puffs into the lungs every 6 (six) hours as needed for wheezing. 07/03/13   Coralyn HellingVineet Sood, MD  budesonide-formoterol (SYMBICORT) 160-4.5 MCG/ACT inhaler Inhale 2 puffs into the lungs 2 (two) times daily. 07/16/13   Coralyn HellingVineet Sood, MD  diltiazem (CARDIZEM CD) 240 MG 24 hr capsule Take 1 capsule (240 mg total) by mouth daily. 02/21/13   Bernadene PersonKathryn A Whiteheart, NP  ferrous fumarate (HEMOCYTE - 106 MG FE) 325 (106 FE) MG TABS Take 1 tablet by mouth 2 (two) times daily. Takes every other other day    Historical Provider, MD  fluticasone (FLONASE) 50 MCG/ACT nasal spray Place 2 sprays into the nose daily. 02/21/13   Bernadene PersonKathryn A Whiteheart, NP  HYDROcodone-acetaminophen (NORCO) 5-325 MG per tablet Take 1 tablet by mouth every 6 (six) hours as needed. 11/03/13   Shanna CiscoMegan E Docherty, MD  losartan (COZAAR) 100 MG tablet Take 1 tablet (100 mg total) by mouth daily. 01/06/14   Coralyn HellingVineet Sood, MD  mometasone (NASONEX) 50 MCG/ACT nasal spray Place 2 sprays into the nose daily. 07/16/13   Coralyn HellingVineet Sood,  MD  predniSONE (DELTASONE) 20 MG tablet Take 3 tablets (60 mg total) by mouth daily with breakfast. 09/21/13   Nelia Shiobert L Beaton, MD  predniSONE (DELTASONE) 20 MG tablet 3 tabs po day one, then 2 po daily x 4 days 11/04/13   Shanna CiscoMegan E Docherty, MD  Spacer/Aero-Holding Chambers (AEROCHAMBER Z-STAT PLUS) inhaler Use as instructed 07/03/13   Coralyn HellingVineet Sood, MD  Vitamin D, Ergocalciferol, (DRISDOL) 50000 UNITS CAPS Take 50,000 Units by mouth every 7 (seven) days.    Historical Provider, MD   Triage Vitals: BP 146/88  Pulse 102  Temp(Src) 98.5 F (36.9 C) (Oral)  Resp 32  SpO2 98%  LMP 02/07/2014 Physical Exam  Nursing note and vitals reviewed. Constitutional: She is oriented to person, place, and time.  She appears well-developed and well-nourished.  HENT:  Head: Normocephalic and atraumatic.  Eyes: EOM are normal. Pupils are equal, round, and reactive to light.  Neck: Normal range of motion. Neck supple.  Cardiovascular: Normal rate, normal heart sounds and intact distal pulses.   Pulmonary/Chest: Effort normal. She has wheezes.  Decreased air movement.  Abdominal: Bowel sounds are normal. She exhibits no distension. There is no tenderness.  Musculoskeletal: Normal range of motion. She exhibits no edema and no tenderness.  Neurological: She is alert and oriented to person, place, and time. She has normal strength. No cranial nerve deficit or sensory deficit.  Skin: Skin is warm and dry. No rash noted.  Psychiatric: She has a normal mood and affect.   ED Course  Procedures (including critical care time) DIAGNOSTIC STUDIES: Oxygen Saturation is 98% on room air, normal by my interpretation.    COORDINATION OF CARE: At 5:27 PM: Discussed treatment plan with patient which includes a breathing treatment and steroids. Patient agrees.   Labs Review Results for orders placed during the hospital encounter of 03/02/14  CBC WITH DIFFERENTIAL      Result Value Ref Range   WBC 6.0  4.0 - 10.5 K/uL   RBC 4.63  3.87 - 5.11 MIL/uL   Hemoglobin 13.4  12.0 - 15.0 g/dL   HCT 16.139.6  09.636.0 - 04.546.0 %   MCV 85.5  78.0 - 100.0 fL   MCH 28.9  26.0 - 34.0 pg   MCHC 33.8  30.0 - 36.0 g/dL   RDW 40.916.3 (*) 81.111.5 - 91.415.5 %   Platelets 296  150 - 400 K/uL   Neutrophils Relative % 40 (*) 43 - 77 %   Neutro Abs 2.4  1.7 - 7.7 K/uL   Lymphocytes Relative 39  12 - 46 %   Lymphs Abs 2.3  0.7 - 4.0 K/uL   Monocytes Relative 13 (*) 3 - 12 %   Monocytes Absolute 0.8  0.1 - 1.0 K/uL   Eosinophils Relative 7 (*) 0 - 5 %   Eosinophils Absolute 0.4  0.0 - 0.7 K/uL   Basophils Relative 1  0 - 1 %   Basophils Absolute 0.0  0.0 - 0.1 K/uL  BASIC METABOLIC PANEL      Result Value Ref Range   Sodium 144  137 - 147 mEq/L    Potassium 3.7  3.7 - 5.3 mEq/L   Chloride 107  96 - 112 mEq/L   CO2 24  19 - 32 mEq/L   Glucose, Bld 89  70 - 99 mg/dL   BUN 15  6 - 23 mg/dL   Creatinine, Ser 7.821.10  0.50 - 1.10 mg/dL   Calcium 9.1  8.4 - 95.610.5 mg/dL  GFR calc non Af Amer 58 (*) >90 mL/min   GFR calc Af Amer 68 (*) >90 mL/min  TROPONIN I      Result Value Ref Range   Troponin I <0.30  <0.30 ng/mL  PRO B NATRIURETIC PEPTIDE      Result Value Ref Range   Pro B Natriuretic peptide (BNP) 32.5  0 - 125 pg/mL   Imaging Review Dg Chest 2 View  03/02/2014   CLINICAL DATA:  Shortness of breath. Wheezing. Current history of asthma and hypertension.  EXAM: CHEST  2 VIEW  COMPARISON:  DG CHEST 2 VIEW dated 11/03/2013; DG CHEST 2 VIEW dated 03/22/2013; DG CHEST 1V PORT dated 02/17/2013; DG CHEST 1V PORT dated 02/16/2013  FINDINGS: Cardiomediastinal silhouette unremarkable and unchanged. Mild central peribronchial thickening, similar to priors. Lungs otherwise clear. No localized airspace consolidation. No pleural effusions. No pneumothorax. Normal pulmonary vascularity. Dystrophic calcification in the coracoclavicular ligament on the right, with degenerative changes in the right [facility] joint and likely remote AC separation, unchanged.  IMPRESSION: Stable mild changes of chronic asthma and/or bronchitis. No acute cardiopulmonary disease.   Electronically Signed   By: Hulan Saas M.D.   On: 03/02/2014 18:59     EKG Interpretation   Date/Time:  Sunday Mar 02 2014 17:24:30 EDT Ventricular Rate:  92 PR Interval:  118 QRS Duration: 82 QT Interval:  362 QTC Calculation: 447 R Axis:   68 Text Interpretation:  Normal sinus rhythm Normal ECG No significant change  since last tracing Confirmed by Mareena Cavan  MD, Leonette Most [phone]) on 03/02/2014  5:30:52 PM      MDM   Final diagnoses:  Asthma exacerbation   Labs and imaging reviewed and unremarkable. Pt feeling much better, wheezing improved. Sitting up in no distress and asking to go  home. Will give steroid burst. Advised Pulm followup for recheck and return for worsening.   I personally performed the services described in this documentation, which was scribed in my presence. The recorded information has been reviewed and is accurate.        Tilia Faso B. Bernette Mayers, MD 03/02/14 [id number]

## 2014-03-02 NOTE — Discharge Instructions (Signed)
Asthma, Adult Asthma is a recurring condition in which the airways tighten and narrow. Asthma can make it difficult to breathe. It can cause coughing, wheezing, and shortness of breath. Asthma episodes (also called asthma attacks) range from minor to life-threatening. Asthma cannot be cured, but medicines and lifestyle changes can help control it. CAUSES Asthma is believed to be caused by inherited (genetic) and environmental factors, but its exact cause is unknown. Asthma may be triggered by allergens, lung infections, or irritants in the air. Asthma triggers are different for each person. Common triggers include:   Animal dander.  Dust mites.  Cockroaches.  Pollen from trees or grass.  Mold.  Smoke.  Air pollutants such as dust, household cleaners, hair sprays, aerosol sprays, paint fumes, strong chemicals, or strong odors.  Cold air, weather changes, and winds (which increase molds and pollens in the air).  Strong emotional expressions such as crying or laughing hard.  Stress.  Certain medicines (such as aspirin) or types of drugs (such as beta-blockers).  Sulfites in foods and drinks. Foods and drinks that may contain sulfites include dried fruit, potato chips, and sparkling grape juice.  Infections or inflammatory conditions such as the flu, a cold, or an inflammation of the nasal membranes (rhinitis).  Gastroesophageal reflux disease (GERD).  Exercise or strenuous activity. SYMPTOMS Symptoms may occur immediately after asthma is triggered or many hours later. Symptoms include:  Wheezing.  Excessive nighttime or early morning coughing.  Frequent or severe coughing with a common cold.  Chest tightness.  Shortness of breath. DIAGNOSIS  The diagnosis of asthma is made by a review of your medical history and a physical exam. Tests may also be performed. These may include:  Lung function studies. These tests show how much air you breath in and out.  Allergy  tests.  Imaging tests such as X-rays. TREATMENT  Asthma cannot be cured, but it can usually be controlled. Treatment involves identifying and avoiding your asthma triggers. It also involves medicines. There are 2 classes of medicine used for asthma treatment:   Controller medicines. These prevent asthma symptoms from occurring. They are usually taken every day.  Reliever or rescue medicines. These quickly relieve asthma symptoms. They are used as needed and provide short-term relief. Your health care provider will help you create an asthma action plan. An asthma action plan is a written plan for managing and treating your asthma attacks. It includes a list of your asthma triggers and how they may be avoided. It also includes information on when medicines should be taken and when their dosage should be changed. An action plan may also involve the use of a device called a peak flow meter. A peak flow meter measures how well the lungs are working. It helps you monitor your condition. HOME CARE INSTRUCTIONS   Take medicine as directed by your health care provider. Speak with your health care provider if you have questions about how or when to take the medicines.  Use a peak flow meter as directed by your health care provider. Record and keep track of readings.  Understand and use the action plan to help minimize or stop an asthma attack without needing to seek medical care.  Control your home environment in the following ways to help prevent asthma attacks:  Do not smoke. Avoid being exposed to secondhand smoke.  Change your heating and air conditioning filter regularly.  Limit your use of fireplaces and wood stoves.  Get rid of pests (such as roaches and   mice) and their droppings.  Throw away plants if you see mold on them.  Clean your floors and dust regularly. Use unscented cleaning products.  Try to have someone else vacuum for you regularly. Stay out of rooms while they are being  vacuumed and for a short while afterward. If you vacuum, use a dust mask from a hardware store, a double-layered or microfilter vacuum cleaner bag, or a vacuum cleaner with a HEPA filter.  Replace carpet with wood, tile, or vinyl flooring. Carpet can trap dander and dust.  Use allergy-proof pillows, mattress covers, and box spring covers.  Wash bed sheets and blankets every week in hot water and dry them in a dryer.  Use blankets that are made of polyester or cotton.  Clean bathrooms and kitchens with bleach. If possible, have someone repaint the walls in these rooms with mold-resistant paint. Keep out of the rooms that are being cleaned and painted.  Wash hands frequently. SEEK MEDICAL CARE IF:   You have wheezing, shortness of breath, or a cough even if taking medicine to prevent attacks.  The colored mucus you cough up (sputum) is thicker than usual.  Your sputum changes from clear or white to yellow, green, gray, or bloody.  You have any problems that may be related to the medicines you are taking (such as a rash, itching, swelling, or trouble breathing).  You are using a reliever medicine more than 2 3 times per week.  Your peak flow is still at 50 79% of you personal best after following your action plan for 1 hour. SEEK IMMEDIATE MEDICAL CARE IF:   You seem to be getting worse and are unresponsive to treatment during an asthma attack.  You are short of breath even at rest.  You get short of breath when doing very little physical activity.  You have difficulty eating, drinking, or talking due to asthma symptoms.  You develop chest pain.  You develop a fast heartbeat.  You have a bluish color to your lips or fingernails.  You are lightheaded, dizzy, or faint.  Your peak flow is less than 50% of your personal best.  You have a fever or persistent symptoms for more than 2 3 days.  You have a fever and symptoms suddenly get worse. MAKE SURE YOU:   Understand these  instructions.  Will watch your condition.  Will get help right away if you are not doing well or get worse. Document Released: 10/10/2005 Document Revised: 06/12/2013 Document Reviewed: 05/09/2013 [employer] Patient Information 2014 [employer].  

## 2014-03-22 ENCOUNTER — Encounter (HOSPITAL_BASED_OUTPATIENT_CLINIC_OR_DEPARTMENT_OTHER): Payer: Self-pay | Admitting: Emergency Medicine

## 2014-03-22 ENCOUNTER — Emergency Department (HOSPITAL_BASED_OUTPATIENT_CLINIC_OR_DEPARTMENT_OTHER)
Admission: EM | Admit: 2014-03-22 | Discharge: 2014-03-22 | Disposition: A | Discharge status: Home / Self Care | Payer: Managed Care, Other (non HMO) | Attending: Emergency Medicine | Admitting: Emergency Medicine

## 2014-03-22 DIAGNOSIS — R3915 Urgency of urination: Secondary | ICD-10-CM | POA: Insufficient documentation

## 2014-03-22 DIAGNOSIS — Z87891 Personal history of nicotine dependence: Secondary | ICD-10-CM | POA: Insufficient documentation

## 2014-03-22 DIAGNOSIS — Z79899 Other long term (current) drug therapy: Secondary | ICD-10-CM | POA: Insufficient documentation

## 2014-03-22 DIAGNOSIS — M129 Arthropathy, unspecified: Secondary | ICD-10-CM | POA: Insufficient documentation

## 2014-03-22 DIAGNOSIS — Z3202 Encounter for pregnancy test, result negative: Secondary | ICD-10-CM | POA: Insufficient documentation

## 2014-03-22 DIAGNOSIS — R350 Frequency of micturition: Secondary | ICD-10-CM | POA: Insufficient documentation

## 2014-03-22 DIAGNOSIS — J45909 Unspecified asthma, uncomplicated: Secondary | ICD-10-CM | POA: Insufficient documentation

## 2014-03-22 DIAGNOSIS — I10 Essential (primary) hypertension: Secondary | ICD-10-CM | POA: Insufficient documentation

## 2014-03-22 DIAGNOSIS — IMO0002 Reserved for concepts with insufficient information to code with codable children: Secondary | ICD-10-CM | POA: Insufficient documentation

## 2014-03-22 DIAGNOSIS — Z8669 Personal history of other diseases of the nervous system and sense organs: Secondary | ICD-10-CM | POA: Insufficient documentation

## 2014-03-22 DIAGNOSIS — Z792 Long term (current) use of antibiotics: Secondary | ICD-10-CM | POA: Insufficient documentation

## 2014-03-22 LAB — URINALYSIS, ROUTINE W REFLEX MICROSCOPIC
Glucose, UA: NEGATIVE mg/dL
Hgb urine dipstick: NEGATIVE
Ketones, ur: 15 mg/dL — AB
Leukocytes, UA: NEGATIVE
Nitrite: NEGATIVE
Protein, ur: NEGATIVE mg/dL
SPECIFIC GRAVITY, URINE: 1.035 — AB (ref 1.005–1.030)
UROBILINOGEN UA: 1 mg/dL (ref 0.0–1.0)
pH: 5.5 (ref 5.0–8.0)

## 2014-03-22 LAB — WET PREP, GENITAL
Clue Cells Wet Prep HPF POC: NONE SEEN
Trich, Wet Prep: NONE SEEN
Yeast Wet Prep HPF POC: NONE SEEN

## 2014-03-22 LAB — PREGNANCY, URINE: PREG TEST UR: NEGATIVE

## 2014-03-22 NOTE — ED Notes (Signed)
Patient c/o increased urination frequency, constant urge to urinate for the past four days,

## 2014-03-22 NOTE — Discharge Instructions (Signed)
Urinary Frequency °The number of times a normal person urinates depends upon how much liquid they take in and how much liquid they are losing. If the temperature is hot and there is high humidity then the person will sweat more and usually breathe a little more frequently. These factors decrease the amount of frequency of urination that would be considered normal. °The amount you drink is easily determined, but the amount of fluid lost is sometimes more difficult to calculate.  °Fluid is lost in two ways: °· Sensible fluid loss is usually measured by the amount of urine that you get rid of. Losses of fluid can also occur with diarrhea. °· Insensible fluid loss is more difficult to measure. It is caused by evaporation. Insensible loss of fluid occurs through breathing and sweating. It usually ranges from a little less than a quart to a little more than a quart of fluid a day. °In normal temperatures and activity levels the average person may urinate 4 to 7 times in a 24-hour period. Needing to urinate more often than that could indicate a problem. If one urinates 4 to 7 times in 24 hours and has large volumes each time, that could indicate a different problem from one who urinates 4 to 7 times a day and has small volumes. The time of urinating is also an important. Most urinating should be done during the waking hours. Getting up at night to urinate frequently can indicate some problems. °CAUSES  °The bladder is the organ in your lower abdomen that holds urine. Like a balloon, it swells some as it fills up. Your nerves sense this and tell you it is time to head for the bathroom. There are a number of reasons that you might feel the need to urinate more often than usual. They include: °· Urinary tract infection. This is usually associated with other signs such as burning when you urinate. °· In men, problems with the prostate (a walnut-size gland that is located near the tube that carries urine out of your body).  There are two reasons why the prostate can cause an increased frequency of urination: °· An enlarged prostate that does not let the bladder empty well. If the bladder only half empties when you urinate then it only has half the capacity to fill before you have to urinate again. °· The nerves in the bladder become more hypersensitive with an increased size of the prostate even if the bladder empties completely. °· Pregnancy. °· Obesity. Excess weight is more likely to cause a problem for women more than for men. °· Bladder stones or other bladder problems. °· Caffeine. °· Alcohol. °· Medications. For example, drugs that help the body get rid of extra fluid (diuretics) increase urine production. Some other medicines must be taken with lots of fluids. °· Muscle or nerve weakness. This might be the result of a spinal cord injury, a stroke, multiple sclerosis or Parkinson's disease. °· Long-standing diabetes can decrease the sensation of the bladder. This loss of sensation makes it harder to sense the bladder needs to be emptied. Over a period of years the bladder is stretched out by constant overfilling. This weakens the bladder muscles so that the bladder does not empty well and has less capacity to fill with new urine. °· Interstitial cystitis (also called painful bladder syndrome). This condition develops because the tissues that line the insider of the bladder are inflamed (inflammation is the body's way of reacting to injury or infection). It causes pain   and frequent urination. It occurs in women more often than in men. °DIAGNOSIS  °· To decide what might be causing your urinary frequency, your healthcare provider will probably: °· Ask about symptoms you have noticed. °· Ask about your overall health. This will include questions about any medications you are taking. °· Do a physical examination. °· Order some tests. These might include: °· A blood test to check for diabetes or other health issues that could be  contributing to the problem. °· Urine testing. This could measure the flow of urine and the pressure on the bladder. °· A test of your neurological system (the brain, spinal cord and nerves). This is the system that senses the need to urinate. °· A bladder test to check whether it is emptying completely when you urinate. °· Cytoscopy. This test uses a thin tube with a tiny camera on it. It offers a look inside your urethra and bladder to see if there are problems. °· Imaging tests. You might be given a contrast dye and then asked to urinate. X-rays are taken to see how your bladder is working. °TREATMENT  °It is important for you to be evaluated to determine if the amount or frequency that you have is unusual or abnormal. If it is found to be abnormal the cause should be determined and this can usually be found out easily. Depending upon the cause treatment could include medication, stimulation of the nerves, or surgery. °There are not too many things that you can do as an individual to change your urinary frequency. It is important that you balance the amount of fluid intake needed to compensate for your activity and the temperature. Medical problems will be diagnosed and taken care of by your physician. There is no particular bladder training such as Kegel's exercises that you can do to help urinary frequency. This is an exercise this is usually done for people who have leaking of urine when they laugh cough or sneeze. °HOME CARE INSTRUCTIONS  °· Take any medications your healthcare provider prescribed or suggested. Follow the directions carefully. °· Practice any lifestyle changes that are recommended. These might include: °· Drinking less fluid or drinking at different times of the day. If you need to urinate often during the night, for example, you may need to stop drinking fluids early in the evening. °· Cutting down on caffeine or alcohol. They both can make you need to urinate more often than normal.  Caffeine is found in coffee, tea and sodas. °· Losing weight, if that is recommended. °· Keep a journal or a log. You might be asked to record how much you drink and when and when you feel the need to urinate. This will also help evaluate how well the treatment provided by your physician is working. °SEEK MEDICAL CARE IF:  °· Your need to urinate often gets worse. °· You feel increased pain or irritation when you urinate. °· You notice blood in your urine. °· You have questions about any medications that your healthcare provider recommended. °· You notice blood, pus or swelling at the site of any test or treatment procedure. °· You develop a fever of more than 100.5° F (38.1° C). °SEEK IMMEDIATE MEDICAL CARE IF:  °You develop a fever of more than 102.0° F (38.9° C). °Document Released: 08/06/2009 Document Revised: 01/02/2012 Document Reviewed: 08/06/2009 °[employer]® Patient Information ©2014 [employer]. ° °

## 2014-03-22 NOTE — ED Provider Notes (Signed)
CSN: [id number]     Arrival date & time 03/22/14  0801 History   First MD Initiated Contact with Patient 03/22/14 [phone]     Chief Complaint  Patient presents with  . Urinary Frequency     (Consider location/radiation/quality/duration/timing/severity/associated sxs/prior Treatment) Patient is a 48 y.o. female presenting with frequency. The history is provided by the patient.  Urinary Frequency This is a new problem. Episode onset: 4 days. The problem occurs constantly. The problem has been gradually worsening. Associated symptoms comments: Suprapubic pressure.  Urinary frequency/urgency.  Vaginal odor without discharge.  No fever, vomiting or back pain. Nothing aggravates the symptoms. Nothing relieves the symptoms. She has tried nothing for the symptoms. The treatment provided no relief.    Past Medical History  Diagnosis Date  . Arthritis   . Asthma   . Hypertension   . Influenza B April 2014    Complicated by multi-organ failure  . 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 OSA (obstructive sleep apnea) 03/20/2013   Past Surgical History  Procedure Laterality Date  . Tubal ligation    . Cesarean section     No family history on file. History  Substance Use Topics  . Smoking status: Former Smoker -- 0.25 packs/day for 20 years    Types: Cigarettes    Quit date: 01/31/2013  . Smokeless tobacco: Never Used  . Alcohol Use: No   OB History   Grav Para Term Preterm Abortions TAB SAB Ect Mult Living                 Review of Systems  Genitourinary: Positive for frequency.  All other systems reviewed and are negative.     Allergies  Other and Tomato  Home Medications   Prior to Admission medications   Medication Sig Start Date End Date Taking? Authorizing Provider  albuterol (PROVENTIL) (2.5 MG/3ML) 0.083% nebulizer solution Take 3 mLs (2.5 mg total) by nebulization every 6 (six) hours as needed for wheezing. DX 493.92 07/16/13   Coralyn HellingVineet Sood, MD  albuterol (VENTOLIN  HFA) 108 (90 BASE) MCG/ACT inhaler Inhale 2 puffs into the lungs every 6 (six) hours as needed for wheezing. 07/03/13   Coralyn HellingVineet Sood, MD  budesonide-formoterol (SYMBICORT) 160-4.5 MCG/ACT inhaler Inhale 2 puffs into the lungs 2 (two) times daily. 07/16/13   Coralyn HellingVineet Sood, MD  diltiazem (CARDIZEM CD) 240 MG 24 hr capsule Take 1 capsule (240 mg total) by mouth daily. 02/21/13   Bernadene PersonKathryn A Whiteheart, NP  ferrous fumarate (HEMOCYTE - 106 MG FE) 325 (106 FE) MG TABS Take 1 tablet by mouth 2 (two) times daily. Takes every other other day    Historical Provider, MD  fluticasone (FLONASE) 50 MCG/ACT nasal spray Place 2 sprays into the nose daily. 02/21/13   Bernadene PersonKathryn A Whiteheart, NP  HYDROcodone-acetaminophen (NORCO) 5-325 MG per tablet Take 1 tablet by mouth every 6 (six) hours as needed. 11/03/13   Shanna CiscoMegan E Docherty, MD  losartan (COZAAR) 100 MG tablet Take 1 tablet (100 mg total) by mouth daily. 01/06/14   Coralyn HellingVineet Sood, MD  mometasone (NASONEX) 50 MCG/ACT nasal spray Place 2 sprays into the nose daily. 07/16/13   Coralyn HellingVineet Sood, MD  predniSONE (DELTASONE) 20 MG tablet Take 3 tablets (60 mg total) by mouth daily with breakfast. 09/21/13   Nelia Shiobert L Beaton, MD  predniSONE (DELTASONE) 20 MG tablet Take 3 tablets (60 mg total) by mouth daily. 03/02/14   Charles B. Bernette MayersSheldon, MD  Spacer/Aero-Holding Chambers (AEROCHAMBER Z-STAT PLUS) inhaler Use as  instructed 07/03/13   [name], MD  Vitamin D, Ergocalciferol, (DRISDOL) 50000 UNITS CAPS Take 50,000 Units by mouth every 7 (seven) days.    Historical Provider, MD   BP 131/84  Pulse 106  Temp(Src) 97.7 F (36.5 C) (Oral)  Resp 20  Ht 5\' 6"  (1.676 m)  Wt 265 lb (120.203 kg)  BMI 42.79 kg/m2  SpO2 97%  LMP 02/07/2014 Physical Exam  Nursing note and vitals reviewed. Constitutional: She is oriented to person, place, and time. She appears well-developed and well-nourished. No distress.  HENT:  Head: Normocephalic and atraumatic.  Eyes: EOM are normal. Pupils are equal, round,  and reactive to light.  Cardiovascular: Normal rate.   Pulmonary/Chest: Effort normal.  Genitourinary: Vagina normal and uterus normal. Cervix exhibits no motion tenderness, no discharge and no friability. Right adnexum displays no mass, no tenderness and no fullness. Left adnexum displays no mass, no tenderness and no fullness. No vaginal discharge found.  Neurological: She is alert and oriented to person, place, and time.  Skin: Skin is warm and dry. No rash noted. No erythema.  Psychiatric: She has a normal mood and affect. Her behavior is normal.    ED Course  Procedures (including critical care time) Labs Review Labs Reviewed  WET PREP, GENITAL - Abnormal; Notable for the following:    WBC, Wet Prep HPF POC FEW (*)    All other components within normal limits  URINALYSIS, ROUTINE W REFLEX MICROSCOPIC - Abnormal; Notable for the following:    Color, Urine AMBER (*)    APPearance CLOUDY (*)    Specific Gravity, Urine 1.035 (*)    Bilirubin Urine SMALL (*)    Ketones, ur 15 (*)    All other components within normal limits  GC/CHLAMYDIA PROBE AMP  URINE CULTURE  PREGNANCY, URINE    Imaging Review No results found.   EKG Interpretation None      MDM   Final diagnoses:  Urinary frequency    Patient here with urinary frequency and urgency for the last 4 days. She denies any dysuria or vaginal discharge but feels that the last few days she has had a smell in her vaginal area. She complains of some mild suprapubic pressure but no significant pain. She otherwise is well-appearing and afebrile. Urine without significant findings so pelvic will be performed for further evaluation. Pelvic without concerning findings.  Wet prep and Gc/chlamydia pending.  Pt is low risk for STD with only 1 sexual partner her husband.    9:07 AM Wet prep without concerning findings.  Findings discussed with pt and will have her see her GYN next week if symptoms worsen.    Gwyneth Sprout,  MD 03/22/14 [phone]

## 2014-03-23 LAB — URINE CULTURE

## 2014-03-24 LAB — GC/CHLAMYDIA PROBE AMP
CT PROBE, AMP APTIMA: NEGATIVE
GC Probe RNA: NEGATIVE

## 2014-04-01 ENCOUNTER — Telehealth: Payer: Self-pay | Admitting: Pulmonary Disease

## 2014-04-01 ENCOUNTER — Encounter: Payer: Self-pay | Admitting: Emergency Medicine

## 2014-04-01 ENCOUNTER — Ambulatory Visit (INDEPENDENT_AMBULATORY_CARE_PROVIDER_SITE_OTHER): Payer: Managed Care, Other (non HMO) | Admitting: Emergency Medicine

## 2014-04-01 VITALS — BP 140/78 | HR 104 | Ht 65.5 in | Wt 270.0 lb

## 2014-04-01 DIAGNOSIS — R058 Other specified cough: Secondary | ICD-10-CM

## 2014-04-01 DIAGNOSIS — R079 Chest pain, unspecified: Secondary | ICD-10-CM

## 2014-04-01 DIAGNOSIS — R05 Cough: Secondary | ICD-10-CM

## 2014-04-01 DIAGNOSIS — R059 Cough, unspecified: Secondary | ICD-10-CM

## 2014-04-01 DIAGNOSIS — G4733 Obstructive sleep apnea (adult) (pediatric): Secondary | ICD-10-CM

## 2014-04-01 MED ORDER — OXYCODONE HCL 5 MG PO CAPS: 5.0000 mg | ORAL_CAPSULE | ORAL | Status: DC | PRN

## 2014-04-01 NOTE — Assessment & Plan Note (Signed)
CPAP every night. °

## 2014-04-01 NOTE — Assessment & Plan Note (Addendum)
Suspect this is drug seeking behavior. I will give her a small number of oxycodone, explained to her that the pred will likely relieve her chest discomfort. We will see if she asks for more narcotics.

## 2014-04-01 NOTE — Progress Notes (Signed)
Chief Complaint  Patient presents with  . Acute Visit    VS pt--c/o:  increased sob, wheezing and cough with thick mucus 2-3 weeks.  Finished pred taper with no relief    History of Present Illness: Kathleen Rodriguez is a 48 y.o. female former smoker with asthma, OSA, and admission for influenza pneumonia in April 2014.  She was in ER again 2 days ago for breathing trouble.  She was treated with continuous nebulizer and given prednisone.  She still has cough, sore throat, and feels short of breath.  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  She does not feel like symbicort is working as well as before.  She is worried that she might need to apply for disability.  She has lots of stress at work and home, and feels like stress makes her breathing worse.  She is not getting much cough, sputum, or wheeze >> when she does she feels this comes from her throat and upper chest.  She has not received CPAP machine yet >> she will get paycheck this month, and then get CPAP.  Acute OV 04/01/14 -- acute visit for pt followed by Dr Halford Chessman for OSA, asthma. She went to the ED 5/10 and 5/30 for cough and dyspnea, was treated with pred 60 x 5 days.  She says that she may have been briefly better but has continued to cough and have wheeze + SOB. She is coughing white-yellow phlegm. She is taking symbicort unreliably, bothers her throat. She is taking nasonex. She uses albuterol nebs prn, hasn't needed every day. She is off lisinopril. She has gone back to cigarettes "off and on".  She never mentioned CP to me, but at the end of the visit she asked me for hydrocodone.   TESTS: 02/05/13 CT chest >> no PE, no acute findings 02/11/13 Labs >> ANA negative, Anti-GBM Ab < 1, ANCA negative 02/14/13 Echo >> EF 65-70%, mild LVH, grade 1 diastolic dysfx PSG 9/62/95 >> AHI 16.4, SpO2 low 84%. PFT 05/15/13 >> FEV1 1.90 (76%), FEV1% 72, TLC 4.23 (81%), DLCO 89%, no  BD  Kathleen Rodriguez  has a past medical history of Arthritis; Asthma; Hypertension; Influenza B (April 2014); Critical illness myopathy (April 2014); and OSA (obstructive sleep apnea) (03/20/2013).  Kathleen Rodriguez  has past surgical history that includes Tubal ligation and Cesarean section.  Prior to Admission medications   Medication Sig Start Date End Date Taking? Authorizing Provider  albuterol (PROVENTIL) (2.5 MG/3ML) 0.083% nebulizer solution Take 3 mLs (2.5 mg total) by nebulization every 6 (six) hours as needed for wheezing. DX 493.92 02/27/13  Yes Chesley Mires, MD  budesonide-formoterol (SYMBICORT) 160-4.5 MCG/ACT inhaler Inhale 2 puffs into the lungs 2 (two) times daily. 02/21/13  Yes Marijean Heath, NP  diltiazem (CARDIZEM CD) 240 MG 24 hr capsule Take 1 capsule (240 mg total) by mouth daily. 02/21/13  Yes Marijean Heath, NP  fluticasone (FLONASE) 50 MCG/ACT nasal spray Place 2 sprays into the nose daily. 02/21/13  Yes Marijean Heath, NP  albuterol (PROVENTIL HFA;VENTOLIN HFA) 108 (90 BASE) MCG/ACT inhaler Inhale 2 puffs into the lungs every 6 (six) hours as needed for wheezing.    Historical Provider, MD    Allergies  Allergen Reactions  . Other Hives and Itching    Ketchup  . Tomato Hives and Itching     Physical Exam: Filed Vitals:   04/01/14 1112  BP: 140/78  Pulse: 104  Height: 5' 5.5" (1.664 m)  Weight: 270 lb (122.471 kg)  SpO2: 95%   Gen: Obese, in no distress,  normal affect  ENT: No lesions,  mouth clear,  oropharynx clear, no postnasal drip  Neck: No JVD, no TMG, no carotid bruits  Lungs: No use of accessory muscles, low pitched exp wheezes  Cardiovascular: RRR, heart sounds normal, no murmur or gallops, no peripheral edema  Musculoskeletal: No deformities, no cyanosis or clubbing  Neuro: alert, non focal, a bit tangential  Skin: Warm, no lesions or rashes    Assessment/Plan: OSA (obstructive sleep apnea) CPAP every night  Chest  pain Suspect this is drug seeking behavior. I will give her a small number of oxycodone, explained to her that the pred will likely relieve her chest discomfort. We will see if she asks for more narcotics.    Upper airway cough syndrome Sustained irritation, only responds temporarily to pred. Need tighter control of her GERD, allergies, OSA. I explained all of this to her.  - pred taper - allergy regimen, restart symbicort reliably     Baltazar Apo, MD, PhD 04/01/2014, 12:10 PM [facility] [phone] or if no answer [phone]

## 2014-04-01 NOTE — Assessment & Plan Note (Signed)
Sustained irritation, only responds temporarily to pred. Need tighter control of her GERD, allergies, OSA. I explained all of this to her.  - pred taper - allergy regimen, restart symbicort reliably

## 2014-04-01 NOTE — Telephone Encounter (Signed)
Called spoke w/ pt. She c/o asthma flare. Pt is scheduled to come in and see RB this Am at 11 AM. Nothing further needed

## 2014-04-01 NOTE — Patient Instructions (Addendum)
Take prednisone as directed Take hydrocodone as needed for your chest pain.  You need to stop smoking Restart you symbicort twice a day. Rinse your mouth and throat out after using Use your albuterol as needed for shortness of breath Go back to wearing your CPAP every night Follow with Dr Craige Cotta next available.   Caroline Sauger

## 2014-04-02 ENCOUNTER — Telehealth: Payer: Self-pay | Admitting: Emergency Medicine

## 2014-04-02 MED ORDER — PREDNISONE 10 MG PO TABS: ORAL_TABLET | ORAL | Status: DC

## 2014-04-02 MED ORDER — ALBUTEROL SULFATE (2.5 MG/3ML) 0.083% IN NEBU: 2.5000 mg | INHALATION_SOLUTION | Freq: Four times a day (QID) | RESPIRATORY_TRACT | Status: DC | PRN

## 2014-04-02 NOTE — Telephone Encounter (Signed)
Go ahead and refill albuterol pred taper  > Take 40mg  daily for 3 days, then 30mg  daily for 3 days, then 20mg  daily for 3 days, then 10mg  daily for 3 days, then stop Im not changing the pain med. Leave it as is.

## 2014-04-02 NOTE — Telephone Encounter (Signed)
Per OV 04/01/14; Patient Instructions      Take prednisone as directed Take hydrocodone as needed for your chest pain.   You need to stop smoking Restart you symbicort twice a day. Rinse your mouth and throat out after using Use your albuterol as needed for shortness of breath Go back to wearing your CPAP every night Follow with Dr Craige Cotta next available.   Called spoke with pt. The prednisone was never called in. Also she needs refill on albuterol nebulizer. She was giving RX for Oxy IR 5 mg. She reports this is not what she wanted. She wanted hydrocodone. Please advise RB thanks  Allergies  Allergen Reactions  . Other Hives and Itching    Ketchup  . Tomato Hives and Itching

## 2014-04-02 NOTE — Telephone Encounter (Signed)
Spoke with pt.  Informed her of below recs per RB and apologized for pred rx not being sent before now.   She is aware the prednisone taper along with the albuterol neb have been send to [facility] [address] and is aware RB would like to leave pain med as is. She verbalized understanding of instructions and voiced no further questions or concerns at this time.

## 2014-04-06 ENCOUNTER — Emergency Department (HOSPITAL_COMMUNITY): Payer: Managed Care, Other (non HMO)

## 2014-04-06 ENCOUNTER — Encounter (HOSPITAL_COMMUNITY): Payer: Self-pay | Admitting: Emergency Medicine

## 2014-04-06 ENCOUNTER — Inpatient Hospital Stay (HOSPITAL_COMMUNITY)
Admission: EM | Admit: 2014-04-06 | Discharge: 2014-04-09 | DRG: 190 | Disposition: A | Discharge status: Home / Self Care | Payer: Managed Care, Other (non HMO) | Attending: Internal Medicine | Admitting: Internal Medicine

## 2014-04-06 DIAGNOSIS — Z91199 Patient's noncompliance with other medical treatment and regimen due to unspecified reason: Secondary | ICD-10-CM

## 2014-04-06 DIAGNOSIS — R609 Edema, unspecified: Secondary | ICD-10-CM | POA: Diagnosis present

## 2014-04-06 DIAGNOSIS — J189 Pneumonia, unspecified organism: Secondary | ICD-10-CM | POA: Diagnosis present

## 2014-04-06 DIAGNOSIS — K219 Gastro-esophageal reflux disease without esophagitis: Secondary | ICD-10-CM | POA: Diagnosis present

## 2014-04-06 DIAGNOSIS — J45902 Unspecified asthma with status asthmaticus: Secondary | ICD-10-CM | POA: Diagnosis present

## 2014-04-06 DIAGNOSIS — B3731 Acute candidiasis of vulva and vagina: Secondary | ICD-10-CM | POA: Diagnosis present

## 2014-04-06 DIAGNOSIS — Z6841 Body Mass Index (BMI) 40.0 and over, adult: Secondary | ICD-10-CM

## 2014-04-06 DIAGNOSIS — J45901 Unspecified asthma with (acute) exacerbation: Secondary | ICD-10-CM

## 2014-04-06 DIAGNOSIS — B373 Candidiasis of vulva and vagina: Secondary | ICD-10-CM | POA: Diagnosis present

## 2014-04-06 DIAGNOSIS — M129 Arthropathy, unspecified: Secondary | ICD-10-CM | POA: Diagnosis present

## 2014-04-06 DIAGNOSIS — Z8249 Family history of ischemic heart disease and other diseases of the circulatory system: Secondary | ICD-10-CM

## 2014-04-06 DIAGNOSIS — A419 Sepsis, unspecified organism: Secondary | ICD-10-CM

## 2014-04-06 DIAGNOSIS — J9600 Acute respiratory failure, unspecified whether with hypoxia or hypercapnia: Secondary | ICD-10-CM | POA: Diagnosis present

## 2014-04-06 DIAGNOSIS — J441 Chronic obstructive pulmonary disease with (acute) exacerbation: Principal | ICD-10-CM | POA: Diagnosis present

## 2014-04-06 DIAGNOSIS — I517 Cardiomegaly: Secondary | ICD-10-CM

## 2014-04-06 DIAGNOSIS — G4733 Obstructive sleep apnea (adult) (pediatric): Secondary | ICD-10-CM | POA: Diagnosis present

## 2014-04-06 DIAGNOSIS — Z87891 Personal history of nicotine dependence: Secondary | ICD-10-CM

## 2014-04-06 DIAGNOSIS — I10 Essential (primary) hypertension: Secondary | ICD-10-CM | POA: Diagnosis present

## 2014-04-06 DIAGNOSIS — Z9119 Patient's noncompliance with other medical treatment and regimen: Secondary | ICD-10-CM

## 2014-04-06 DIAGNOSIS — R079 Chest pain, unspecified: Secondary | ICD-10-CM

## 2014-04-06 LAB — MRSA PCR SCREENING: MRSA by PCR: NEGATIVE

## 2014-04-06 LAB — DIFFERENTIAL
BASOS ABS: 0 10*3/uL (ref 0.0–0.1)
Basophils Relative: 0 % (ref 0–1)
Eosinophils Absolute: 0.1 10*3/uL (ref 0.0–0.7)
Eosinophils Relative: 1 % (ref 0–5)
LYMPHS ABS: 4.2 10*3/uL — AB (ref 0.7–4.0)
Lymphocytes Relative: 26 % (ref 12–46)
Monocytes Absolute: 1.6 10*3/uL — ABNORMAL HIGH (ref 0.1–1.0)
Monocytes Relative: 10 % (ref 3–12)
NEUTROS ABS: 10.2 10*3/uL — AB (ref 1.7–7.7)
Neutrophils Relative %: 63 % (ref 43–77)

## 2014-04-06 LAB — COMPREHENSIVE METABOLIC PANEL
ALBUMIN: 3.3 g/dL — AB (ref 3.5–5.2)
ALK PHOS: 63 U/L (ref 39–117)
ALT: 11 U/L (ref 0–35)
AST: 16 U/L (ref 0–37)
BUN: 17 mg/dL (ref 6–23)
CHLORIDE: 102 meq/L (ref 96–112)
CO2: 21 mEq/L (ref 19–32)
Calcium: 8.7 mg/dL (ref 8.4–10.5)
Creatinine, Ser: 0.85 mg/dL (ref 0.50–1.10)
GFR calc Af Amer: >90 mL/min (ref >90)
GFR calc non Af Amer: 80 mL/min — ABNORMAL LOW (ref >90)
Glucose, Bld: 110 mg/dL — ABNORMAL HIGH (ref 70–99)
Potassium: 3.8 mEq/L (ref 3.7–5.3)
Sodium: 139 mEq/L (ref 137–147)
Total Bilirubin: <0.2 mg/dL — ABNORMAL LOW (ref 0.3–1.2)
Total Protein: 7.2 g/dL (ref 6.0–8.3)

## 2014-04-06 LAB — BLOOD GAS, VENOUS
ACID-BASE DEFICIT: 0.5 mmol/L (ref 0.0–2.0)
BICARBONATE: 23.5 meq/L (ref 20.0–24.0)
FIO2: 0.28 %
O2 SAT: 83.7 %
PO2 VEN: 48 mmHg — AB (ref 30.0–45.0)
Patient temperature: 98.6
TCO2: 21.2 mmol/L (ref 0–100)
pCO2, Ven: 38.4 mmHg — ABNORMAL LOW (ref 45.0–50.0)
pH, Ven: 7.404 — ABNORMAL HIGH (ref 7.250–7.300)

## 2014-04-06 LAB — BASIC METABOLIC PANEL
BUN: 17 mg/dL (ref 6–23)
CALCIUM: 8.5 mg/dL (ref 8.4–10.5)
CO2: 23 meq/L (ref 19–32)
Chloride: 105 mEq/L (ref 96–112)
Creatinine, Ser: 0.8 mg/dL (ref 0.50–1.10)
GFR calc Af Amer: >90 mL/min (ref >90)
GFR calc non Af Amer: 86 mL/min — ABNORMAL LOW (ref >90)
GLUCOSE: 116 mg/dL — AB (ref 70–99)
Potassium: 3.7 mEq/L (ref 3.7–5.3)
Sodium: 140 mEq/L (ref 137–147)

## 2014-04-06 LAB — CBC
HCT: 37.6 % (ref 36.0–46.0)
Hemoglobin: 12.2 g/dL (ref 12.0–15.0)
MCH: 27.5 pg (ref 26.0–34.0)
MCHC: 32.4 g/dL (ref 30.0–36.0)
MCV: 84.9 fL (ref 78.0–100.0)
Platelets: 331 10*3/uL (ref 150–400)
RBC: 4.43 MIL/uL (ref 3.87–5.11)
RDW: 17.1 % — ABNORMAL HIGH (ref 11.5–15.5)
WBC: 15 10*3/uL — ABNORMAL HIGH (ref 4.0–10.5)

## 2014-04-06 LAB — EXPECTORATED SPUTUM ASSESSMENT W GRAM STAIN, RFLX TO RESP C: Special Requests: NORMAL

## 2014-04-06 LAB — PROCALCITONIN

## 2014-04-06 LAB — EXPECTORATED SPUTUM ASSESSMENT W REFEX TO RESP CULTURE

## 2014-04-06 LAB — STREP PNEUMONIAE URINARY ANTIGEN: STREP PNEUMO URINARY ANTIGEN: NEGATIVE

## 2014-04-06 LAB — I-STAT TROPONIN, ED: Troponin i, poc: 0 ng/mL (ref 0.00–0.08)

## 2014-04-06 MED ORDER — SODIUM CHLORIDE 0.9 % IV BOLUS (SEPSIS)
1000.0000 mL | Freq: Once | INTRAVENOUS | Status: AC
  Administered 2014-04-06: 1000 mL via INTRAVENOUS

## 2014-04-06 MED ORDER — IPRATROPIUM BROMIDE 0.02 % IN SOLN
0.5000 mg | Freq: Once | RESPIRATORY_TRACT | Status: AC
  Administered 2014-04-06: 0.5 mg via RESPIRATORY_TRACT
  Filled 2014-04-06: qty 2.5

## 2014-04-06 MED ORDER — SODIUM CHLORIDE 0.9 % IV SOLN: 250.0000 mL | INTRAVENOUS | Status: DC | PRN

## 2014-04-06 MED ORDER — DEXTROSE 5 % IV SOLN
500.0000 mg | Freq: Once | INTRAVENOUS | Status: DC
  Filled 2014-04-06: qty 500

## 2014-04-06 MED ORDER — DEXTROSE 5 % IV SOLN
1.0000 g | Freq: Once | INTRAVENOUS | Status: AC
  Administered 2014-04-06: 1 g via INTRAVENOUS
  Filled 2014-04-06: qty 10

## 2014-04-06 MED ORDER — MAGNESIUM SULFATE 50 % IJ SOLN
2.0000 g | Freq: Once | INTRAMUSCULAR | Status: DC
  Filled 2014-04-06: qty 4

## 2014-04-06 MED ORDER — DILTIAZEM HCL ER COATED BEADS 240 MG PO CP24
240.0000 mg | ORAL_CAPSULE | Freq: Every day | ORAL | Status: DC
  Administered 2014-04-06 – 2014-04-09 (×4): 240 mg via ORAL
  Filled 2014-04-06 (×4): qty 2
  Filled 2014-04-06: qty 1

## 2014-04-06 MED ORDER — ENOXAPARIN SODIUM 40 MG/0.4ML ~~LOC~~ SOLN
40.0000 mg | SUBCUTANEOUS | Status: DC
  Administered 2014-04-06: 40 mg via SUBCUTANEOUS
  Filled 2014-04-06: qty 0.4

## 2014-04-06 MED ORDER — MAGNESIUM SULFATE 40 MG/ML IJ SOLN
2.0000 g | Freq: Once | INTRAMUSCULAR | Status: AC
  Administered 2014-04-06: 2 g via INTRAVENOUS
  Filled 2014-04-06: qty 50

## 2014-04-06 MED ORDER — VANCOMYCIN HCL 10 G IV SOLR
2500.0000 mg | Freq: Once | INTRAVENOUS | Status: AC
  Administered 2014-04-06: 2500 mg via INTRAVENOUS
  Filled 2014-04-06: qty 2500

## 2014-04-06 MED ORDER — ENOXAPARIN SODIUM 60 MG/0.6ML ~~LOC~~ SOLN
60.0000 mg | SUBCUTANEOUS | Status: DC
  Administered 2014-04-07 – 2014-04-09 (×3): 60 mg via SUBCUTANEOUS
  Filled 2014-04-06 (×3): qty 0.6

## 2014-04-06 MED ORDER — METHYLPREDNISOLONE SODIUM SUCC 40 MG IJ SOLR
40.0000 mg | Freq: Two times a day (BID) | INTRAMUSCULAR | Status: DC
  Administered 2014-04-06 – 2014-04-07 (×3): 40 mg via INTRAVENOUS
  Filled 2014-04-06 (×3): qty 1

## 2014-04-06 MED ORDER — ALBUTEROL SULFATE (2.5 MG/3ML) 0.083% IN NEBU
5.0000 mg | INHALATION_SOLUTION | Freq: Once | RESPIRATORY_TRACT | Status: AC
  Administered 2014-04-06: 5 mg via RESPIRATORY_TRACT
  Filled 2014-04-06: qty 6

## 2014-04-06 MED ORDER — ALBUTEROL SULFATE (2.5 MG/3ML) 0.083% IN NEBU
5.0000 mg | INHALATION_SOLUTION | RESPIRATORY_TRACT | Status: DC | PRN
  Administered 2014-04-06: 5 mg via RESPIRATORY_TRACT
  Filled 2014-04-06: qty 6

## 2014-04-06 MED ORDER — PANTOPRAZOLE SODIUM 40 MG IV SOLR
40.0000 mg | Freq: Every day | INTRAVENOUS | Status: DC
  Administered 2014-04-06: 40 mg via INTRAVENOUS
  Filled 2014-04-06: qty 40

## 2014-04-06 MED ORDER — PREDNISONE 20 MG PO TABS
40.0000 mg | ORAL_TABLET | Freq: Every day | ORAL | Status: DC
  Administered 2014-04-06: 40 mg via ORAL
  Filled 2014-04-06: qty 2

## 2014-04-06 MED ORDER — DEXTROSE 5 % IV SOLN
500.0000 mg | INTRAVENOUS | Status: DC
  Administered 2014-04-06 – 2014-04-08 (×3): 500 mg via INTRAVENOUS
  Filled 2014-04-06 (×3): qty 500

## 2014-04-06 MED ORDER — LISINOPRIL 10 MG PO TABS: 10.0000 mg | ORAL_TABLET | Freq: Every day | ORAL | Status: DC

## 2014-04-06 MED ORDER — FUROSEMIDE 10 MG/ML IJ SOLN
40.0000 mg | Freq: Two times a day (BID) | INTRAMUSCULAR | Status: AC
  Administered 2014-04-06 – 2014-04-07 (×4): 40 mg via INTRAVENOUS
  Filled 2014-04-06 (×4): qty 4

## 2014-04-06 MED ORDER — BUDESONIDE-FORMOTEROL FUMARATE 160-4.5 MCG/ACT IN AERO
2.0000 | INHALATION_SPRAY | Freq: Two times a day (BID) | RESPIRATORY_TRACT | Status: DC
  Administered 2014-04-06: 2 via RESPIRATORY_TRACT
  Filled 2014-04-06 (×2): qty 6

## 2014-04-06 MED ORDER — IPRATROPIUM BROMIDE 0.02 % IN SOLN
0.5000 mg | Freq: Four times a day (QID) | RESPIRATORY_TRACT | Status: DC
  Administered 2014-04-06 (×4): 0.5 mg via RESPIRATORY_TRACT
  Filled 2014-04-06 (×5): qty 2.5

## 2014-04-06 MED ORDER — MAGNESIUM SULFATE 50 % IJ SOLN: 2.0000 g | Freq: Once | INTRAMUSCULAR | Status: DC

## 2014-04-06 MED ORDER — GUAIFENESIN ER 600 MG PO TB12
600.0000 mg | ORAL_TABLET | Freq: Two times a day (BID) | ORAL | Status: DC
  Administered 2014-04-06 – 2014-04-09 (×7): 600 mg via ORAL
  Filled 2014-04-06 (×9): qty 1

## 2014-04-06 MED ORDER — SODIUM CHLORIDE 0.9 % IJ SOLN
3.0000 mL | Freq: Two times a day (BID) | INTRAMUSCULAR | Status: DC
  Administered 2014-04-06 – 2014-04-07 (×2): 3 mL via INTRAVENOUS

## 2014-04-06 MED ORDER — FLUTICASONE PROPIONATE 50 MCG/ACT NA SUSP
2.0000 | Freq: Every day | NASAL | Status: DC
  Administered 2014-04-06 – 2014-04-09 (×4): 2 via NASAL
  Filled 2014-04-06: qty 16

## 2014-04-06 MED ORDER — DEXTROSE 5 % IV SOLN
1.0000 g | INTRAVENOUS | Status: DC
  Administered 2014-04-07 – 2014-04-08 (×2): 1 g via INTRAVENOUS
  Filled 2014-04-06 (×2): qty 10

## 2014-04-06 MED ORDER — VANCOMYCIN HCL 10 G IV SOLR
1250.0000 mg | Freq: Two times a day (BID) | INTRAVENOUS | Status: DC
  Administered 2014-04-06 – 2014-04-07 (×2): 1250 mg via INTRAVENOUS
  Filled 2014-04-06 (×2): qty 1250

## 2014-04-06 MED ORDER — DEXTROMETHORPHAN POLISTIREX 30 MG/5ML PO LQCR
30.0000 mg | Freq: Two times a day (BID) | ORAL | Status: DC
  Administered 2014-04-06 – 2014-04-09 (×7): 30 mg via ORAL
  Filled 2014-04-06 (×8): qty 5

## 2014-04-06 MED ORDER — SODIUM CHLORIDE 0.9 % IJ SOLN: 3.0000 mL | INTRAMUSCULAR | Status: DC | PRN

## 2014-04-06 MED ORDER — ALBUTEROL SULFATE (2.5 MG/3ML) 0.083% IN NEBU
2.5000 mg | INHALATION_SOLUTION | RESPIRATORY_TRACT | Status: DC | PRN
  Administered 2014-04-06 (×3): 2.5 mg via RESPIRATORY_TRACT
  Filled 2014-04-06 (×3): qty 3

## 2014-04-06 NOTE — ED Notes (Signed)
Neb tx completed, BC x 2 drawn, antbx and fluid started.

## 2014-04-06 NOTE — Progress Notes (Signed)
ANTIBIOTIC CONSULT NOTE - INITIAL  Pharmacy Consult for Vancomycin Indication: CAP (intubated in April due to PNA)  Allergies  Allergen Reactions  . Other Hives and Itching    Ketchup  . Tomato Hives and Itching    Patient Measurements: Height: 5\' 5"  (165.1 cm) Weight: 270 lb 8.1 oz (122.7 kg) IBW/kg (Calculated) : 57   Vital Signs: Temp: 98.1 F (36.7 C) (06/14 0530) Temp src: Oral (06/14 0530) BP: 167/73 mmHg (06/14 0600) Pulse Rate: 107 (06/14 0600) Intake/Output from previous day:   Intake/Output from this shift:    Labs:  Recent Labs  04/06/14 0157  WBC 15.0*  HGB 12.2  PLT 331  CREATININE 0.80   Estimated Creatinine Clearance: 113.1 ml/min (by C-G formula based on Cr of 0.8). No results found for this basename: VANCOTROUGH, Leodis BinetVANCOPEAK, VANCORANDOM, GENTTROUGH, GENTPEAK, GENTRANDOM, TOBRATROUGH, TOBRAPEAK, TOBRARND, AMIKACINPEAK, AMIKACINTROU, AMIKACIN,  in the last 72 hours   Microbiology: Recent Results (from the past 720 hour(s))  URINE CULTURE     Status: None   Collection Time    03/22/14  8:05 AM      Result Value Ref Range Status   Specimen Description URINE, CLEAN CATCH   Final   Special Requests NONE   Final   Culture  Setup Time     Final   Value: 03/22/2014 23:03     Performed at [facility]   Colony Count     Final   Value: 30,000 COLONIES/ML     Performed at [facility]   Culture     Final   Value: Multiple bacterial morphotypes present, none predominant. Suggest appropriate recollection if clinically indicated.     Performed at [facility]   Report Status 03/23/2014 FINAL   Final  WET PREP, GENITAL     Status: Abnormal   Collection Time    03/22/14  8:49 AM      Result Value Ref Range Status   Yeast Wet Prep HPF POC NONE SEEN  NONE SEEN Final   Trich, Wet Prep NONE SEEN  NONE SEEN Final   Clue Cells Wet Prep HPF POC NONE SEEN  NONE SEEN Final   WBC, Wet Prep HPF POC FEW (*) NONE SEEN Final  GC/CHLAMYDIA  PROBE AMP     Status: None   Collection Time    03/22/14  8:49 AM      Result Value Ref Range Status   CT Probe RNA NEGATIVE  NEGATIVE Final   GC Probe RNA NEGATIVE  NEGATIVE Final   Comment: (NOTE)                                                                                               **Normal Reference Range: Negative**          Assay performed using the Gen-Probe APTIMA COMBO2 (R) Assay.     Acceptable specimen types for this assay include APTIMA Swabs (Unisex,     endocervical, urethral, or vaginal), first void urine, and ThinPrep     liquid based cytology samples.     Performed at [facility]  Medical History: Past Medical History  Diagnosis Date  . Arthritis   . Asthma   . Hypertension   . Influenza B April 2014    Complicated by multi-organ failure  . 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 OSA (obstructive sleep apnea) 03/20/2013    Medications:  Scheduled:  . azithromycin (ZITHROMAX) 500 MG IVPB  500 mg Intravenous Once  . azithromycin  500 mg Intravenous Q24H  . budesonide-formoterol  2 puff Inhalation BID  . [START ON 04/07/2014] cefTRIAXone (ROCEPHIN)  IV  1 g Intravenous Q24H  . diltiazem  240 mg Oral Daily  . enoxaparin (LOVENOX) injection  40 mg Subcutaneous Q24H  . fluticasone  2 spray Each Nare Daily  . guaiFENesin  600 mg Oral BID  . ipratropium  0.5 mg Nebulization QID  . lisinopril  10 mg Oral Daily  . pantoprazole (PROTONIX) IV  40 mg Intravenous QHS  . predniSONE  40 mg Oral Q breakfast  . sodium chloride  3 mL Intravenous Q12H  . vancomycin  2,500 mg Intravenous Once   Infusions:   Assessment: 48 yo with hx of arthritis, asthma, influenza B, critical illness myopathy and OSA c/o SOB.  MD ordering Vancomycin in addition to Roc/Zmax for CAP.  Pt was intubated in April with PNA.   Goal of Therapy:  Vancomycin trough level 15-20 mcg/ml  Plan:   Vancomycin 2500mg  x1 then 1250mg  IV q12h  (CrCl~98)  F/u SCr/levels/cultures as  needeed  Lorenza EvangelistGreen, Orlie Cundari R 04/06/2014,6:19 AM

## 2014-04-06 NOTE — Progress Notes (Signed)
  Echocardiogram 2D Echocardiogram has been performed.  Estelle GrumblesMyers, Brooke Payes J 04/06/2014, 9:42 AM

## 2014-04-06 NOTE — ED Provider Notes (Signed)
CSN: [id number]     Arrival date & time 04/06/14  0117 History   First MD Initiated Contact with Patient 04/06/14 [phone]     Chief Complaint  Patient presents with  . Asthma     (Consider location/radiation/quality/duration/timing/severity/associated sxs/prior Treatment) HPI This patient is a 48 yo woman with a history of COPD/asthma who presents with 1 week of wheezing and productive cough with increasingly severe SOB. NO fever. She has been using her Albuterol MDI and nebs without adequate relief. She feels SOB with ambulation but, sometimes at rest, as well. Her po intake has been wnl.   Of note the patient was last hospitalized in April 2014 at which time she required intubation for respiratory failure.   Past Medical History  Diagnosis Date  . Arthritis   . Asthma   . Hypertension   . Influenza B April 2014    Complicated by multi-organ failure  . 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 OSA (obstructive sleep apnea) 03/20/2013   Past Surgical History  Procedure Laterality Date  . Tubal ligation    . Cesarean section     No family history on file. History  Substance Use Topics  . Smoking status: Former Smoker -- 0.25 packs/day for 20 years    Types: Cigarettes    Quit date: 01/31/2013  . Smokeless tobacco: Never Used  . Alcohol Use: No   OB History   Grav Para Term Preterm Abortions TAB SAB Ect Mult Living                 Review of Systems Ten point review of symptoms performed and is negative with the exception of symptoms noted above.     Allergies  Other and Tomato  Home Medications   Prior to Admission medications   Medication Sig Start Date End Date Taking? Authorizing Provider  albuterol (PROVENTIL) (2.5 MG/3ML) 0.083% nebulizer solution Take 3 mLs (2.5 mg total) by nebulization every 6 (six) hours as needed for wheezing. DX 493.90 04/02/14   Leslye Peerobert S Byrum, MD  albuterol (VENTOLIN HFA) 108 (90 BASE) MCG/ACT inhaler Inhale 2 puffs into the lungs every 6  (six) hours as needed for wheezing. 07/03/13   Coralyn HellingVineet Sood, MD  budesonide-formoterol (SYMBICORT) 160-4.5 MCG/ACT inhaler Inhale 2 puffs into the lungs 2 (two) times daily. 07/16/13   Coralyn HellingVineet Sood, MD  diltiazem (CARDIZEM CD) 240 MG 24 hr capsule Take 1 capsule (240 mg total) by mouth daily. 02/21/13   Bernadene PersonKathryn A Whiteheart, NP  ferrous fumarate (HEMOCYTE - 106 MG FE) 325 (106 FE) MG TABS Take 1 tablet by mouth 2 (two) times daily. Takes every other other day    Historical Provider, MD  fluticasone (FLONASE) 50 MCG/ACT nasal spray Place 2 sprays into the nose daily. 02/21/13   Bernadene PersonKathryn A Whiteheart, NP  lisinopril (PRINIVIL,ZESTRIL) 10 MG tablet Take 10 mg by mouth daily.    Historical Provider, MD  mometasone (NASONEX) 50 MCG/ACT nasal spray Place 2 sprays into the nose daily. 07/16/13   Coralyn HellingVineet Sood, MD  oxycodone (OXY-IR) 5 MG capsule Take 1 capsule (5 mg total) by mouth every 4 (four) hours as needed. 04/01/14   Leslye Peerobert S Byrum, MD  predniSONE (DELTASONE) 10 MG tablet Take 40mg  daily for 3 days, then 30mg  daily for 3 days, then 20mg  daily for 3 days, then 10mg  daily for 3 days, then stop 04/02/14   Leslye Peerobert S Byrum, MD  Spacer/Aero-Holding Chambers (AEROCHAMBER Z-STAT PLUS) inhaler Use as instructed 07/03/13   Vineet  Sood, MD  Vitamin D, Ergocalciferol, (DRISDOL) 50000 UNITS CAPS Take 50,000 Units by mouth every 7 (seven) days.    Historical Provider, MD   BP 153/74  Pulse 110  Temp(Src) 98.2 F (36.8 C) (Oral)  Resp 17  SpO2 93%  LMP 03/06/2014 Physical Exam Gen: well developed and well nourished appearing Head: NCAT Eyes: PERL, EOMI Nose: no epistaixis or rhinorrhea Mouth/throat: mucosa is moist and pink Neck: supple, no stridor Lungs: RR 28 to 32/min, BS diminished throughout, scattered high pitched wheezing.  CV: rapid and regular, pulse 108 bpm, no murmur, extremities appear well perfused.  Abd: obese, soft, notender, nondistended Back: no ttp, no cva ttp Skin: warm and dry Ext: normal to  inspection, no dependent edema Neuro: CN ii-xii grossly intact, no focal deficits Psyche; normal affect,  calm and cooperative.   ED Course  Procedures (including critical care time) Labs Review  Results for orders placed during the hospital encounter of 04/06/14 (from the past 24 hour(s))  BLOOD GAS, VENOUS     Status: Abnormal   Collection Time    04/06/14  1:52 AM      Result Value Ref Range   FIO2 0.28     Delivery systems NASAL CANNULA     pH, Ven 7.404 (*) 7.250 - 7.300   pCO2, Ven 38.4 (*) 45.0 - 50.0 mmHg   pO2, Ven 48.0 (*) 30.0 - 45.0 mmHg   Bicarbonate 23.5  20.0 - 24.0 mEq/L   TCO2 21.2  0 - 100 mmol/L   Acid-base deficit 0.5  0.0 - 2.0 mmol/L   O2 Saturation 83.7     Patient temperature 98.6     Collection site VEIN     Drawn by COLLECTED BY LABORATORY     Sample type VENOUS    CBC     Status: Abnormal   Collection Time    04/06/14  1:57 AM      Result Value Ref Range   WBC 15.0 (*) 4.0 - 10.5 K/uL   RBC 4.43  3.87 - 5.11 MIL/uL   Hemoglobin 12.2  12.0 - 15.0 g/dL   HCT 47.8  29.5 - 62.1 %   MCV 84.9  78.0 - 100.0 fL   MCH 27.5  26.0 - 34.0 pg   MCHC 32.4  30.0 - 36.0 g/dL   RDW 30.8 (*) 65.7 - 84.6 %   Platelets 331  150 - 400 K/uL  BASIC METABOLIC PANEL     Status: Abnormal   Collection Time    04/06/14  1:57 AM      Result Value Ref Range   Sodium 140  137 - 147 mEq/L   Potassium 3.7  3.7 - 5.3 mEq/L   Chloride 105  96 - 112 mEq/L   CO2 23  19 - 32 mEq/L   Glucose, Bld 116 (*) 70 - 99 mg/dL   BUN 17  6 - 23 mg/dL   Creatinine, Ser 9.62  0.50 - 1.10 mg/dL   Calcium 8.5  8.4 - 95.2 mg/dL   GFR calc non Af Amer 86 (*) >90 mL/min   GFR calc Af Amer >90  >90 mL/min  I-STAT TROPOININ, ED     Status: None   Collection Time    04/06/14  2:05 AM      Result Value Ref Range   Troponin i, poc 0.00  0.00 - 0.08 ng/mL   Comment 3             Imaging  Review Dg Chest Port 1 View  04/06/2014   CLINICAL DATA:  Chest pain, shortness of breath and cough.  History of smoking and asthma.  EXAM: PORTABLE CHEST - 1 VIEW  COMPARISON:  Chest radiograph performed 03/02/2014  FINDINGS: The lungs are well-aerated. Left basilar airspace opacity raises concern for mild pneumonia. There is no evidence of focal pleural effusion or pneumothorax.  The cardiomediastinal silhouette is within normal limits. No acute osseous abnormalities are seen.  IMPRESSION: Left basilar airspace opacity raises concern for mild pneumonia.   Electronically Signed   By: Roanna RaiderJeffery  Chang M.D.   On: 04/06/2014 02:01   EKG: sinus tach, no acute ischemic changes, normal intervals, normal axis, normal qrs complex  CRITICAL CARE Performed by: Brandt LoosenManly, Shyteria Lewis   Total critical care time: 2159m  Critical care time was exclusive of separately billable procedures and treating other patients.  Critical care was necessary to treat or prevent imminent or life-threatening deterioration.  Critical care was time spent personally by me on the following activities: development of treatment plan with patient and/or surrogate as well as nursing, discussions with consultants, evaluation of patient's response to treatment, examination of patient, obtaining history from patient or surrogate, ordering and performing treatments and interventions, ordering and review of laboratory studies, ordering and review of radiographic studies, pulse oximetry and re-evaluation of patient's condition.    MDM   DDX: status asthmaticus, bronchitis, pneumonia, ACS, pulmonary edema.   CXR shows left basilar infiltrate. We are treating empirically with cef/azithro for CAP. Improvement without resolution after first round of Albuterol 5mg  SVN.We will repeat. The patient has had prednisone. She will need to be admitted.     Brandt LoosenJulie Briget Shaheed, MD 04/06/14 [phone]

## 2014-04-06 NOTE — Consult Note (Signed)
Name: Kathleen AbideLouise Somarriba MRN: [id number] DOB: 06/19/1966    ADMISSION DATE:  04/06/2014 CONSULTATION DATE:  04/06/14  REFERRING MD :  [facility] PRIMARY SERVICE:  [facility]  CHIEF COMPLAINT:  SOB, h/o Asthma  BRIEF PATIENT DESCRIPTION: 48 yr old h/o Asthma, noncompliant OSA, admitted SOB  SIGNIFICANT EVENTS / STUDIES:  6/14- admitted for sob  LINES / TUBES:  CULTURES: 6/14 sputum>>> 6/14 BC>>>  ANTIBIOTICS: 6/14 azithro>>> 6/14 ceftriaxone>>> 6/14 vanc>>>  HISTORY OF PRESENT ILLNESS: Kathleen Rodriguez is a 48 y.o. female has a past medical history of Arthritis; Asthma; Hypertension; Influenza B (April 2014); Critical illness myopathy (April 2014); and OSA (obstructive sleep apnea) (03/20/2013).  Presented with Cough and wheezing for 5 days. She had sore throat and wheezing and was started on prednisone. She continued to have worsening wheezing and presneted to ER she was found to have left basilal infiltrate and persistent wheezing. Reports in April she had to intubated due to PNA, flu pos. Has mild distress in SDU. Called to asst in care.  PAST MEDICAL HISTORY :  Past Medical History  Diagnosis Date  . Arthritis   . Asthma   . Hypertension   . Influenza B April 2014    Complicated by multi-organ failure  . 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 OSA (obstructive sleep apnea) 03/20/2013   Past Surgical History  Procedure Laterality Date  . Tubal ligation    . Cesarean section     Prior to Admission medications   Medication Sig Start Date End Date Taking? Authorizing Provider  albuterol (PROVENTIL) (2.5 MG/3ML) 0.083% nebulizer solution Take 3 mLs (2.5 mg total) by nebulization every 6 (six) hours as needed for wheezing. DX 493.90 04/02/14  Yes Leslye Peerobert S Byrum, MD  albuterol (VENTOLIN HFA) 108 (90 BASE) MCG/ACT inhaler Inhale 2 puffs into the lungs every 6 (six) hours as needed for wheezing. 07/03/13  Yes Coralyn HellingVineet Sood, MD  budesonide-formoterol (SYMBICORT) 160-4.5 MCG/ACT inhaler Inhale 2  puffs into the lungs 2 (two) times daily. 07/16/13  Yes Coralyn HellingVineet Sood, MD  diltiazem (CARDIZEM CD) 240 MG 24 hr capsule Take 1 capsule (240 mg total) by mouth daily. 02/21/13  Yes Bernadene PersonKathryn A Whiteheart, NP  ferrous fumarate (HEMOCYTE - 106 MG FE) 325 (106 FE) MG TABS Take 1 tablet by mouth every other day.    Yes Historical Provider, MD  fluticasone (FLONASE) 50 MCG/ACT nasal spray Place 2 sprays into the nose daily. 02/21/13  Yes Bernadene PersonKathryn A Whiteheart, NP  lisinopril (PRINIVIL,ZESTRIL) 10 MG tablet Take 10 mg by mouth daily.   Yes Historical Provider, MD  predniSONE (DELTASONE) 10 MG tablet Take 40mg  daily for 3 days, then 30mg  daily for 3 days, then 20mg  daily for 3 days, then 10mg  daily for 3 days, then stop 04/02/14  Yes Leslye Peerobert S Byrum, MD  Vitamin D, Ergocalciferol, (DRISDOL) 50000 UNITS CAPS Take 50,000 Units by mouth every 7 (seven) days.   Yes Historical Provider, MD   Allergies  Allergen Reactions  . Other Hives and Itching    Ketchup  . Tomato Hives and Itching    FAMILY HISTORY:  Family History  Problem Relation Age of Onset  . Hypertension Mother   . HIV/AIDS Father    SOCIAL HISTORY:  reports that she quit smoking about 14 months ago. Her smoking use included Cigarettes. She has a 5 pack-year smoking history. She has never used smokeless tobacco. She reports that she does not drink alcohol or use illicit drugs.  REVIEW OF SYSTEMS:  ROS reviewed 12 point systems No abdo pain, no hemopytsis, no burning uination, no n/v, no headaches  SUBJECTIVE:   VITAL SIGNS: Temp:  [98 F (36.7 C)-98.2 F (36.8 C)] 98.1 F (36.7 C) (06/14 0800) Pulse Rate:  [101-117] 107 (06/14 0600) Resp:  [17-20] 18 (06/14 0600) BP: (122-167)/(63-77) 167/73 mmHg (06/14 0600) SpO2:  [92 %-99 %] 98 % (06/14 0600) Weight:  [122.7 kg (270 lb 8.1 oz)] 122.7 kg (270 lb 8.1 oz) (06/14 0530)  PHYSICAL EXAMINATION: General:  Mild distress to none Neuro:  Nonfocal, alert, o  x4 HEENT:  obese Neck:  Slight  coarseness to expiration Cardiovascular:  s1 s2 RRR Lungs:  Coarse, ronchi, wheezing mild expiration Abdomen:  Soft, Bs wnl, obese Musculoskeletal:  Edema 1 plus Skin:  No rash   Recent Labs Lab 04/06/14 0157  NA 140  K 3.7  CL 105  CO2 23  BUN 17  CREATININE 0.80  GLUCOSE 116*    Recent Labs Lab 04/06/14 0157  HGB 12.2  HCT 37.6  WBC 15.0*  PLT 331   Dg Chest Port 1 View  04/06/2014   CLINICAL DATA:  Chest pain, shortness of breath and cough. History of smoking and asthma.  EXAM: PORTABLE CHEST - 1 VIEW  COMPARISON:  Chest radiograph performed 03/02/2014  FINDINGS: The lungs are well-aerated. Left basilar airspace opacity raises concern for mild pneumonia. There is no evidence of focal pleural effusion or pneumothorax.  The cardiomediastinal silhouette is within normal limits. No acute osseous abnormalities are seen.  IMPRESSION: Left basilar airspace opacity raises concern for mild pneumonia.   Electronically Signed   By: Roanna RaiderJeffery  Chang M.D.   On: 04/06/2014 02:01    ASSESSMENT / PLAN:  Astma Exacerbation R.o PNA- LLL R/o failure component (r/o new diastolic dz, r/o mild pa HTN) OSA noncompliance  -lasix to neg balance, chem in am , kvo -dc ACEI, mild coarseness to neck sounds -echo for pa pressures, daistolic dz? -will need cpap while here and to home -continued cap coverage but follow pct and pcxr, may be able to limit abx course, NOT impressed with LLL as of now -dc pred, add IV solumedrol -BDers -assess sputum -tele, pulse ox  Mcarthur Rossettianiel J. Tyson AliasFeinstein, MD, FACP Pgr: [phone] [facility]  Pulmonary and Critical Care Medicine [facility] Pager: [phone]  04/06/2014, 8:28 AM

## 2014-04-06 NOTE — ED Notes (Addendum)
Pt transported via EMS from home c/o asthma exacerbation. Increasing SHOB x 1 week seen by PCP Monday, given prednisone, oxycodone. Pt did Albuterol tx PT EMS arrival, Duoneb and albuterol and Solumedrol 125mg  IV  given by EMS. Per EMS pt desats with movement into 80's. A & O.

## 2014-04-06 NOTE — Progress Notes (Signed)
48 y/o female admitted early this AM with cough with yellow sputum and wheezing. Intubated in April for PNA. Appreciate Pulm eval.   A/p   1. Acute resp failure- due to PNA vs acute broncitis and exacerbation of Asthma- Pulm ruling out diastolic CHF and diuresing her- Prednisone increased to Solumedrol 2. Non-complainat OSA- CPAP ordered  3. HTN- cont home meds 4. Morbid obesity.

## 2014-04-06 NOTE — ED Notes (Signed)
Pt c/o generalized CP with cough x 1 week, pt was seen by PCP with no relief. Pt states she was intubated 1 year ago with PNA and multi organ failure. Pt appears very tired at this time

## 2014-04-06 NOTE — H&P (Signed)
PCP: Christena FlakeVaradarajan, [facility]  Chief Complaint:  Shortness of breath  HPI: Kathleen Rodriguez is a 48 y.o. female   has a past medical history of Arthritis; Asthma; Hypertension; Influenza B (April 2014); Critical illness myopathy (April 2014); and OSA (obstructive sleep apnea) (03/20/2013).   Presented with  Cough and wheezing for 5 days. She went to Pulmonology with complins of sore throat and wheezing and was started on prednisone taper. She continued to have worsening wheezing and presneted to ER she was found to have left basilal infiltrate and persistent wheezing. Reports in April she had to intubated due to PNA.   Hospitalist was called for admission for CAP and asthma exacerbation  Review of Systems:    Pertinent positives include:   chills, ,Sore throat, chest pain form coughing, shortness of breath at rest.   dyspnea on exertion, productive cough,  Constitutional:  No weight loss, night sweats, Fevers,fatigue, weight loss  HEENT:  No headaches, Difficulty swallowing,Tooth/dental problems  No sneezing, itching, ear ache, nasal congestion, post nasal drip,  Cardio-vascular:  No  Orthopnea, PND, anasarca, dizziness, palpitations.no Bilateral lower extremity swelling  GI:  No heartburn, indigestion, abdominal pain, nausea, vomiting, diarrhea, change in bowel habits, loss of appetite, melena, blood in stool, hematemesis Resp:  no No excess mucus, no  No non-productive cough, No coughing up of blood.No change in color of mucus.No wheezing. Skin:  no rash or lesions. No jaundice GU:  no dysuria, change in color of urine, no urgency or frequency. No straining to urinate.  No flank pain.  Musculoskeletal:  No joint pain or no joint swelling. No decreased range of motion. No back pain.  Psych:  No change in mood or affect. No depression or anxiety. No memory loss.  Neuro: no localizing neurological complaints, no tingling, no  weakness, no double vision, no gait abnormality, no slurred speech, no confusion  Otherwise ROS are negative except for above, 10 systems were reviewed  Past Medical History: Past Medical History  Diagnosis Date  . Arthritis   . Asthma   . Hypertension   . Influenza B April 2014    Complicated by multi-organ failure  . 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 OSA (obstructive sleep apnea) 03/20/2013   Past Surgical History  Procedure Laterality Date  . Tubal ligation    . Cesarean section       Medications: Prior to Admission medications   Medication Sig Start Date End Date Taking? Authorizing Provider  albuterol (PROVENTIL) (2.5 MG/3ML) 0.083% nebulizer solution Take 3 mLs (2.5 mg total) by nebulization every 6 (six) hours as needed for wheezing. DX 493.90 04/02/14  Yes Leslye Peerobert S Byrum, MD  albuterol (VENTOLIN HFA) 108 (90 BASE) MCG/ACT inhaler Inhale 2 puffs into the lungs every 6 (six) hours as needed for wheezing. 07/03/13  Yes Coralyn HellingVineet Sood, MD  budesonide-formoterol (SYMBICORT) 160-4.5 MCG/ACT inhaler Inhale 2 puffs into the lungs 2 (two) times daily. 07/16/13  Yes Coralyn HellingVineet Sood, MD  diltiazem (CARDIZEM CD) 240 MG 24 hr capsule Take 1 capsule (240 mg total) by mouth daily. 02/21/13  Yes Bernadene PersonKathryn A Whiteheart, NP  ferrous fumarate (HEMOCYTE - 106 MG FE) 325 (106 FE) MG TABS Take 1 tablet by mouth every other day.    Yes Historical Provider, MD  fluticasone (FLONASE) 50 MCG/ACT nasal spray Place 2 sprays into the nose daily. 02/21/13  Yes Bernadene PersonKathryn A Whiteheart, NP  lisinopril (PRINIVIL,ZESTRIL) 10 MG tablet Take 10  mg by mouth daily.   Yes Historical Provider, MD  predniSONE (DELTASONE) 10 MG tablet Take 40mg  daily for 3 days, then 30mg  daily for 3 days, then 20mg  daily for 3 days, then 10mg  daily for 3 days, then stop 04/02/14  Yes Leslye Peer, MD  Vitamin D, Ergocalciferol, (DRISDOL) 50000 UNITS CAPS Take 50,000 Units by mouth every 7 (seven) days.   Yes Historical Provider, MD     Allergies:   Allergies  Allergen Reactions  . Other Hives and Itching    Ketchup  . Tomato Hives and Itching    Social History:  Ambulatory   independently   Lives at home   With family     reports that she quit smoking about 14 months ago. Her smoking use included Cigarettes. She has a 5 pack-year smoking history. She has never used smokeless tobacco. She reports that she does not drink alcohol or use illicit drugs.    Family History: family history includes HIV/AIDS in her father; Hypertension in her mother.    Physical Exam: Patient Vitals for the past 24 hrs:  BP Temp Temp src Pulse Resp SpO2  04/06/14 0317 122/63 mmHg - - - - -  04/06/14 0316 - 98 F (36.7 C) Oral 117 18 92 %  04/06/14 0309 - - - - - 97 %  04/06/14 0257 - - - - - 93 %  04/06/14 0203 - - - - - 98 %  04/06/14 0128 153/74 mmHg 98.2 F (36.8 C) Oral 110 17 99 %  04/06/14 0118 - - - - - 94 %    1. General:  in No Acute distress 2. Psychological: Alert and   Oriented 3. Head/ENT:   Moist  Mucous Membranes                          Head Non traumatic, neck supple                          Normal Dentition 4. SKIN: normal   Skin turgor,  Skin clean Dry and intact no rash 5. Heart: Regular rate and rhythm no Murmur, Rub or gallop 6. Lungs: extensive  wheezes some crackles  diminished air movement 7. Abdomen: Soft, non-tender, Non distended 8. Lower extremities: no clubbing, cyanosis, or edema 9. Neurologically Grossly intact, moving all 4 extremities equally 10. MSK: Normal range of motion  body mass index is unknown because there is no weight on file.   Labs on Admission:   Recent Labs  04/06/14 0157  NA 140  K 3.7  CL 105  CO2 23  GLUCOSE 116*  BUN 17  CREATININE 0.80  CALCIUM 8.5   No results found for this basename: AST, ALT, ALKPHOS, BILITOT, PROT, ALBUMIN,  in the last 72 hours No results found for this basename: LIPASE, AMYLASE,  in the last 72 hours  Recent Labs   04/06/14 0157  WBC 15.0*  HGB 12.2  HCT 37.6  MCV 84.9  PLT 331   No results found for this basename: CKTOTAL, CKMB, CKMBINDEX, TROPONINI,  in the last 72 hours No results found for this basename: TSH, T4TOTAL, FREET3, T3FREE, THYROIDAB,  in the last 72 hours No results found for this basename: VITAMINB12, FOLATE, FERRITIN, TIBC, IRON, RETICCTPCT,  in the last 72 hours No results found for this basename: HGBA1C    The CrCl is unknown because both a height and weight (  above a minimum accepted value) are required for this calculation. ABG    Component Value Date/Time   PHART 7.290* 02/13/2013 0823   HCO3 23.5 04/06/2014 0152   TCO2 21.2 04/06/2014 0152   ACIDBASEDEF 0.5 04/06/2014 0152   O2SAT 83.7 04/06/2014 0152     Lab Results  Component Value Date   DDIMER 0.33 11/03/2013     Other results:  I have pearsonaly reviewed this: ECG REPORT  Rate: 102  Rhythm: ST ST&T Change: no ischemia   BNP (last 3 results)  Recent Labs  03/02/14 1726  PROBNP 32.5    There were no vitals filed for this visit.   Cultures:    Component Value Date/Time   SDES URINE, CLEAN CATCH 03/22/2014 0805   SPECREQUEST NONE 03/22/2014 0805   CULT  Value: Multiple bacterial morphotypes present, none predominant. Suggest appropriate recollection if clinically indicated. [facility] 03/22/2014 0805   REPTSTATUS 03/23/2014 FINAL 03/22/2014 0805   Radiological Exams on Admission: Dg Chest Port 1 View  04/06/2014   CLINICAL DATA:  Chest pain, shortness of breath and cough. History of smoking and asthma.  EXAM: PORTABLE CHEST - 1 VIEW  COMPARISON:  Chest radiograph performed 03/02/2014  FINDINGS: The lungs are well-aerated. Left basilar airspace opacity raises concern for mild pneumonia. There is no evidence of focal pleural effusion or pneumothorax.  The cardiomediastinal silhouette is within normal limits. No acute osseous abnormalities are seen.  IMPRESSION: Left basilar airspace  opacity raises concern for mild pneumonia.   Electronically Signed   By: Roanna RaiderJeffery  Chang M.D.   On: 04/06/2014 02:01    Chart has been reviewed  Assessment/Plan  48 yo F with hx of asthma requiring intubation in the recent past presents with asthma exacerbation and CAP   Present on Admission:  . Asthma exacerbation - admit to step down, prednisone 40 mg a day, Mucinex, albuterol and and Atrovent nebulizers, PCCM consult  . CAP (community acquired pneumonia) - ceftriaxone, azithromycin and vancomycin; oxygen therapy admit to step down obtain blood cultures  . Hypertension continue home medications  . Chest pain musculoskeletal in origin secondary to cough   Prophylaxis:  Lovenox, Protonix  CODE STATUS:  FULL CODE    Other plan as per orders.  I have spent a total of 65 min on this admission extra time was taken to discuss care with pulmonology  Jocelyn Nold 04/06/2014, 3:52 AM  [facility]  Pager [phone]   If 7AM-7PM, please contact the day team taking care of the patient  [email or web address]  Password [email or web address]

## 2014-04-06 NOTE — ED Notes (Signed)
Bed: [id number] Expected date:  Expected time:  Means of arrival:  Comments: EMS 70F asthma

## 2014-04-07 ENCOUNTER — Inpatient Hospital Stay (HOSPITAL_COMMUNITY): Payer: Managed Care, Other (non HMO)

## 2014-04-07 DIAGNOSIS — Z9119 Patient's noncompliance with other medical treatment and regimen: Secondary | ICD-10-CM

## 2014-04-07 DIAGNOSIS — Z91199 Patient's noncompliance with other medical treatment and regimen due to unspecified reason: Secondary | ICD-10-CM

## 2014-04-07 DIAGNOSIS — G4733 Obstructive sleep apnea (adult) (pediatric): Secondary | ICD-10-CM

## 2014-04-07 LAB — COMPREHENSIVE METABOLIC PANEL
ALBUMIN: 3 g/dL — AB (ref 3.5–5.2)
ALK PHOS: 62 U/L (ref 39–117)
ALT: 12 U/L (ref 0–35)
AST: 9 U/L (ref 0–37)
BUN: 18 mg/dL (ref 6–23)
CO2: 24 mEq/L (ref 19–32)
Calcium: 9.2 mg/dL (ref 8.4–10.5)
Chloride: 103 mEq/L (ref 96–112)
Creatinine, Ser: 1.05 mg/dL (ref 0.50–1.10)
GFR calc Af Amer: 72 mL/min — ABNORMAL LOW (ref >90)
GFR calc non Af Amer: 62 mL/min — ABNORMAL LOW (ref >90)
Glucose, Bld: 139 mg/dL — ABNORMAL HIGH (ref 70–99)
Potassium: 4.4 mEq/L (ref 3.7–5.3)
Sodium: 140 mEq/L (ref 137–147)
TOTAL PROTEIN: 7.2 g/dL (ref 6.0–8.3)

## 2014-04-07 LAB — LEGIONELLA ANTIGEN, URINE: Legionella Antigen, Urine: NEGATIVE

## 2014-04-07 LAB — PROCALCITONIN: Procalcitonin: <0.1 ng/mL

## 2014-04-07 LAB — PRO B NATRIURETIC PEPTIDE: Pro B Natriuretic peptide (BNP): 103.4 pg/mL (ref 0–125)

## 2014-04-07 MED ORDER — PREDNISONE 20 MG PO TABS
40.0000 mg | ORAL_TABLET | Freq: Every day | ORAL | Status: DC
  Administered 2014-04-08: 40 mg via ORAL
  Filled 2014-04-07 (×2): qty 2

## 2014-04-07 MED ORDER — VITAMINS A & D EX OINT
TOPICAL_OINTMENT | CUTANEOUS | Status: AC
  Filled 2014-04-07: qty 5

## 2014-04-07 MED ORDER — ACETAMINOPHEN 325 MG PO TABS
650.0000 mg | ORAL_TABLET | Freq: Four times a day (QID) | ORAL | Status: DC | PRN
  Administered 2014-04-07 – 2014-04-08 (×4): 650 mg via ORAL
  Filled 2014-04-07 (×4): qty 2

## 2014-04-07 MED ORDER — ARFORMOTEROL TARTRATE 15 MCG/2ML IN NEBU
15.0000 ug | INHALATION_SOLUTION | Freq: Two times a day (BID) | RESPIRATORY_TRACT | Status: DC
  Administered 2014-04-07 – 2014-04-08 (×4): 15 ug via RESPIRATORY_TRACT
  Filled 2014-04-07 (×8): qty 2

## 2014-04-07 MED ORDER — METOPROLOL TARTRATE 12.5 MG HALF TABLET
12.5000 mg | ORAL_TABLET | Freq: Two times a day (BID) | ORAL | Status: DC
  Administered 2014-04-07 – 2014-04-09 (×4): 12.5 mg via ORAL
  Filled 2014-04-07 (×5): qty 1

## 2014-04-07 MED ORDER — PANTOPRAZOLE SODIUM 40 MG PO TBEC
40.0000 mg | DELAYED_RELEASE_TABLET | Freq: Two times a day (BID) | ORAL | Status: DC
  Administered 2014-04-07 – 2014-04-09 (×4): 40 mg via ORAL
  Filled 2014-04-07 (×5): qty 1

## 2014-04-07 MED ORDER — BUDESONIDE 0.5 MG/2ML IN SUSP
0.5000 mg | Freq: Two times a day (BID) | RESPIRATORY_TRACT | Status: DC
Start: 2014-04-07 — End: 2014-04-09
  Administered 2014-04-07 – 2014-04-08 (×3): 0.5 mg via RESPIRATORY_TRACT
  Filled 2014-04-07 (×7): qty 2

## 2014-04-07 NOTE — Progress Notes (Signed)
Pt placed on Auto CPAP with 2 LPM O2 bleed in via nasal mask.  Pt tolerating well at this time, RT to monitor and assess as needed.  

## 2014-04-07 NOTE — Progress Notes (Signed)
Name: Kathleen Rodriguez MRN: [id number] DOB: 04/13/1966    ADMISSION DATE:  04/06/2014 CONSULTATION DATE:  04/06/14  REFERRING MD :  [facility] PRIMARY SERVICE:  [facility]  CHIEF COMPLAINT:  SOB, h/o Asthma  BRIEF PATIENT DESCRIPTION: 48 yr old h/o Asthma, noncompliant OSA, admitted SOB 6/14  SIGNIFICANT EVENTS / STUDIES:  6/14- admitted for sob  Diagnostics ECHO 6/14: EF 65%, no wall motion abnormality. Dilated IVC. Tech difficult study   LINES / TUBES:  CULTURES: 6/14 sputum>>> 6/14 BC>>> 6/14 resp viral panel>>>  ANTIBIOTICS: 6/14 azithro>>> 6/14 ceftriaxone>>> 6/14 vanc>>>    SUBJECTIVE:   VITAL SIGNS: Temp:  [97.8 F (36.6 C)-98.3 F (36.8 C)] 97.8 F (36.6 C) (06/15 0400) Pulse Rate:  [89-112] 93 (06/15 0820) Resp:  [11-26] 21 (06/15 0820) BP: (126-162)/(65-79) 136/70 mmHg (06/15 0820) SpO2:  [94 %-100 %] 94 % (06/15 0820) Weight:  [121.5 kg (267 lb 13.7 oz)] 121.5 kg (267 lb 13.7 oz) (06/15 0400)  PHYSICAL EXAMINATION: General:  Mild distress to none Neuro:  Nonfocal, alert, o  x4 HEENT:  obese Neck:  Slight coarseness to expiration Cardiovascular:  s1 s2 RRR Lungs:  Coarse, ronchi, wheezing mild expiration Abdomen:  Soft, Bs wnl, obese Musculoskeletal:  Edema 1 plus Skin:  No rash   Recent Labs Lab 04/06/14 0157 04/06/14 0200 04/07/14 0325  NA 140 139 140  K 3.7 3.8 4.4  CL 105 102 103  CO2 23 21 24   BUN 17 17 18   CREATININE 0.80 0.85 1.05  GLUCOSE 116* 110* 139*    Recent Labs Lab 04/06/14 0157  HGB 12.2  HCT 37.6  WBC 15.0*  PLT 331   Dg Chest Port 1 View  04/07/2014   CLINICAL DATA:  Infiltrate .  EXAM: PORTABLE CHEST - 1 VIEW  COMPARISON:  04/06/2014 .  FINDINGS: Mediastinum and hilar structures are normal. Heart size and pulmonary vascularity stable. Minimal basilar atelectasis. No significant pulmonary infiltrate noted on today's exam. No pleural effusion or pneumothorax. No acute osseus abnormality.  IMPRESSION: Minimal basilar  atelectasis. No significant infiltrate noted on today's exam.   Electronically Signed   By: Maisie Fushomas  Register   On: 04/07/2014 07:48   Dg Chest Port 1 View  04/06/2014   CLINICAL DATA:  Chest pain, shortness of breath and cough. History of smoking and asthma.  EXAM: PORTABLE CHEST - 1 VIEW  COMPARISON:  Chest radiograph performed 03/02/2014  FINDINGS: The lungs are well-aerated. Left basilar airspace opacity raises concern for mild pneumonia. There is no evidence of focal pleural effusion or pneumothorax.  The cardiomediastinal silhouette is within normal limits. No acute osseous abnormalities are seen.  IMPRESSION: Left basilar airspace opacity raises concern for mild pneumonia.   Electronically Signed   By: Roanna RaiderJeffery  Chang M.D.   On: 04/06/2014 02:01  no infiltrate. CXR essentially clear   ASSESSMENT / PLAN:  Astma Exacerbation Prob element of VCD/ upper airway cough syndrome (think this and HTN/transient edema are bigger issues than asthmatic exacerbation) OSA  H/o Gd I Diastolic dysfxn  GERD Medical non-compliance   Ace I stopped, not taking antihypertensives at all.  ECHO w/ EF 65%, no wall motion abnormalities  PCT neg   Plan  -lasix to neg balance, chem in am, kvo  -steroid taper    - focus on upper airway: sinus regimen and reflux  -try brovana and budesonide for now w/ plan to go back to symbicort.   -pulse ox  - rx htn   - f/u resp viral  panel   -d/c vanc  -could narrow to 5d course resp quinolone   -Needs CPAP at HS and at home   04/07/2014, 8:51 AM

## 2014-04-07 NOTE — Progress Notes (Signed)
Name: Kathleen Rodriguez MRN: [id number] DOB: [birth year]    ADMISSION DATE:  04/06/2014 CONSULTATION DATE:  04/06/14  REFERRING MD :  [facility] PRIMARY SERVICE:  [facility]  CHIEF COMPLAINT:  SOB, h/o Asthma  BRIEF PATIENT DESCRIPTION: 48 yr old h/o Asthma, noncompliant OSA, admitted SOB 6/14  SIGNIFICANT EVENTS / STUDIES:  6/14- admitted for sob  Diagnostics ECHO 6/14: EF 65%, no wall motion abnormality. Dilated IVC. Tech difficult study   LINES / TUBES:  CULTURES: 6/14 sputum>>> 6/14 BC>>> 6/14 resp viral panel>>>  ANTIBIOTICS: 6/14 azithro>>> 6/14 ceftriaxone>>> 6/14 vanc>>>    SUBJECTIVE:   VITAL SIGNS: Temp:  [97.6 F (36.4 C)-98.3 F (36.8 C)] 97.6 F (36.4 C) (06/15 0830) Pulse Rate:  [89-102] 93 (06/15 0820) Resp:  [11-26] 21 (06/15 0820) BP: (126-162)/(66-79) 136/70 mmHg (06/15 0820) SpO2:  [94 %-100 %] 94 % (06/15 0820) Weight:  [121.5 kg (267 lb 13.7 oz)] 121.5 kg (267 lb 13.7 oz) (06/15 0400)  PHYSICAL EXAMINATION: General:  Mild distress to none Neuro:  Nonfocal, alert, o  x4 HEENT:  obese Neck:  Slight coarseness to expiration (seems transmitted from throat - staff MD note) Cardiovascular:  s1 s2 RRR Lungs:  Coarse, ronchi, wheezing mild expiration Abdomen:  Soft, Bs wnl, obese Musculoskeletal:  Edema 1 plus Skin:  No rash   PULMONARY  Recent Labs Lab 04/06/14 0152  HCO3 23.5  TCO2 21.2  O2SAT 83.7    CBC  Recent Labs Lab 04/06/14 0157  HGB 12.2  HCT 37.6  WBC 15.0*  PLT 331    COAGULATION No results found for this basename: INR,  in the last 168 hours  CARDIAC  No results found for this basename: TROPONINI,  in the last 168 hours No results found for this basename: PROBNP,  in the last 168 hours   CHEMISTRY  Recent Labs Lab 04/06/14 0157 04/06/14 0200 04/07/14 0325  NA 140 139 140  K 3.7 3.8 4.4  CL 105 102 103  CO2 23 21 24   GLUCOSE 116* 110* 139*  BUN 17 17 18   CREATININE 0.80 0.85 1.05  CALCIUM 8.5 8.7 9.2    Estimated Creatinine Clearance: 85.6 ml/min (by C-G formula based on Cr of 1.05).   LIVER  Recent Labs Lab 04/06/14 0200 04/07/14 0325  AST 16 9  ALT 11 12  ALKPHOS 63 62  BILITOT <0.2* <0.2*  PROT 7.2 7.2  ALBUMIN 3.3* 3.0*     INFECTIOUS  Recent Labs Lab 04/06/14 0600 04/07/14 0325  PROCALCITON <0.10 <0.10     ENDOCRINE CBG (last 3)  No results found for this basename: GLUCAP,  in the last 72 hours       IMAGING x48h  Dg Chest Port 1 View  04/07/2014   CLINICAL DATA:  Infiltrate .  EXAM: PORTABLE CHEST - 1 VIEW  COMPARISON:  04/06/2014 .  FINDINGS: Mediastinum and hilar structures are normal. Heart size and pulmonary vascularity stable. Minimal basilar atelectasis. No significant pulmonary infiltrate noted on today's exam. No pleural effusion or pneumothorax. No acute osseus abnormality.  IMPRESSION: Minimal basilar atelectasis. No significant infiltrate noted on today's exam.   Electronically Signed   By: Maisie Fushomas  Register   On: 04/07/2014 07:48   Dg Chest Port 1 View  04/06/2014   CLINICAL DATA:  Chest pain, shortness of breath and cough. History of smoking and asthma.  EXAM: PORTABLE CHEST - 1 VIEW  COMPARISON:  Chest radiograph performed 03/02/2014  FINDINGS: The lungs are well-aerated. Left basilar airspace opacity  raises concern for mild pneumonia. There is no evidence of focal pleural effusion or pneumothorax.  The cardiomediastinal silhouette is within normal limits. No acute osseous abnormalities are seen.  IMPRESSION: Left basilar airspace opacity raises concern for mild pneumonia.   Electronically Signed   By: Roanna RaiderJeffery  Chang M.D.   On: 04/06/2014 02:01      ASSESSMENT / PLAN:  Astma Exacerbation Prob element of VCD/ upper airway cough syndrome (think this and HTN/transient edema are bigger issues than asthmatic exacerbation) OSA  H/o Gd I Diastolic dysfxn  GERD Medical non-compliance   Ace I stopped, not taking antihypertensives at all.  ECHO  w/ EF 65%, no wall motion abnormalities  PCT neg   Plan  -lasix to neg balance, chem in am, kvo  -steroid taper    - focus on upper airway: sinus regimen and reflux  -try brovana and budesonide for now w/ plan to go back to symbicort.   -pulse ox  - rx htn   - f/u resp viral panel   -d/c vanc  -could narrow to 5d course resp quinolone   -Needs CPAP at HS and at home   Anders SimmondsPete Babcock NP 04/07/2014, 11:44 AM    STAFF MD NOTe  - agree with NPassessment. Will get spirometry with flow volume loop while she is in house to assess her VCD   Dr. Kalman ShanMurali Cherity Blickenstaff, M.D., [facility] Pulmonary and Critical Care Medicine Staff Physician [facility] [place] [facility] Pager: [phone], If no answer or between  15:00h - 7:00h: call [phone]  04/07/2014 11:47 AM

## 2014-04-07 NOTE — Progress Notes (Signed)
RT Note:  Placed patient on CPAP via nasal mask, 10.0 cm H20 with 2 lpm O2 bleed in.  Patient states that is too much pressure. Decreased pressure to 8.0 cm H20. Patient states, too much pressure.  CPAP set to auto titrate settings (5.0 cm min/20.0 max). Patient removed mask and states she is not going to be able to tolerate wearing the mask due to her nostrils are raw. Patient is currently on room air but wears 2 lpm at times.  RT will add H20 for humidity.  RN aware.

## 2014-04-07 NOTE — Progress Notes (Addendum)
Progress Note  Kathleen Rodriguez ZOX:[id number] DOB: 1966/09/02 DOA: 04/06/2014 PCP: Lorenda IshiharaVaradarajan, Rupashree  Admit HPI / Brief Narrative: Kathleen Rodriguez is a 48 y.o. 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  Presented with Cough and wheezing for 5 days. She went to Pulmonology with complins of sore throat and wheezing and was started on prednisone taper. She continued to have worsening wheezing and presneted to ER she was found to have left basilal infiltrate and persistent wheezing. Reports in April she was intubated due to PNA.     HPI/Subjective: 6/15 A./O. x4, admits she is noncompliant with her medication and CPAP (never obtained a machine). Currently negative SOB, negative chest pain.\   Assessment/Plan: Asthma exacerbation  - admit to step down, prednisone 40 mg a day,  -Mucinex DM BID -, albuterol and and Atrovent nebulizers,  -PCCM consulted  -Respiratory virus panel pending -Continue incentive spirometry -  CAP (community acquired pneumonia) - ceftriaxone,+ azithromycin -DC vancomycin -oxygen therapy to maintain SpO2 >92 percent  -blood cultures pending   Hypertension continue home medications  -Continue diltiazem 240 mg daily  -Start metoprolol 12.5 mg  BID -Mild CHF?; Obtain proBNP -Lasix 40 mg BID  GERD -Patient with possible silent GERD causing/exacerbating VCD start Protonix 40 mg BID  OSA -Counseled patient in detail the sequela of continuing to not use CPAP to include increased risk of MI, CVA, death -CPAP per respiratory  Chest pain - musculoskeletal in origin secondary to cough    Code Status: FULL Family Communication: no family present at time of exam Disposition Plan: Resolution of SOB   Consultants: Dr. Rory Percyaniel Feinstein (PCCM.)    Procedure/Significant Events: 6/14 echocardiogram -  Left ventricle: moderate LVH.  -LVEF= 65%- 70%.  - Left atrium:  mildly dilated. - Right ventricle:  normal.  - Impressions: The study is technically very limited.     Culture 6/14 blood right forearm/left antecubital NTD 6/14 sputum pending  Antibiotics: Ceftriaxone 6/14>> Azithromycin 6/14>>   DVT prophylaxis: Lovenox   Devices NA   LINES / TUBES:   6/14 20ga right hand   Continuous Infusions:   Objective: VITAL SIGNS: Temp: 97.8 F (36.6 C) (06/15 0400) Temp src: Oral (06/15 0400) BP: 126/79 mmHg (06/15 0330) Pulse Rate: 89 (06/15 0330) SPO2; 96% on room air FIO2:   Intake/Output Summary (Last 24 hours) at 04/07/14 0802 Last data filed at 04/07/14 0533  Gross per 24 hour  Intake   1230 ml  Output   4650 ml  Net  -3420 ml     Exam: General: A./O. x4, NAD, No acute respiratory distress Lungs: Clear to auscultation bilaterally without wheezes or crackles Cardiovascular: Regular rate and rhythm without murmur gallop or rub normal S1 and S2 Abdomen: Nontender, nondistended, soft, bowel sounds positive, no rebound, no ascites, no appreciable mass Extremities: No significant cyanosis, clubbing, bilateral lower extremities 2+ edema mid calf  Data Reviewed: Basic Metabolic Panel:  Recent Labs Lab 04/06/14 0157 04/06/14 0200 04/07/14 0325  NA 140 139 140  K 3.7 3.8 4.4  CL 105 102 103  CO2 23 21 24   GLUCOSE 116* 110* 139*  BUN 17 17 18   CREATININE 0.80 0.85 1.05  CALCIUM 8.5 8.7 9.2   Liver Function Tests:  Recent Labs Lab 04/06/14 0200 04/07/14 0325  AST 16 9  ALT 11 12  ALKPHOS 63 62  BILITOT <0.2* <0.2*  PROT  7.2 7.2  ALBUMIN 3.3* 3.0*   No results found for this basename: LIPASE, AMYLASE,  in the last 168 hours No results found for this basename: AMMONIA,  in the last 168 hours CBC:  Recent Labs Lab 04/06/14 0157  WBC 15.0*  NEUTROABS 10.2*  HGB 12.2  HCT 37.6  MCV 84.9  PLT 331   Cardiac Enzymes: No results found for  this basename: CKTOTAL, CKMB, CKMBINDEX, TROPONINI,  in the last 168 hours BNP (last 3 results)  Recent Labs  03/02/14 1726  PROBNP 32.5   CBG: No results found for this basename: GLUCAP,  in the last 168 hours  Recent Results (from the past 240 hour(s))  CULTURE, BLOOD (ROUTINE X 2)     Status: None   Collection Time    04/06/14  2:47 AM      Result Value Ref Range Status   Specimen Description BLOOD RIGHT FOREARM   Final   Special Requests BOTTLES DRAWN AEROBIC AND ANAEROBIC 5CC   Final   Culture  Setup Time     Final   Value: 04/06/2014 14:08     [facility]   Culture     Final   Value:        BLOOD CULTURE RECEIVED NO GROWTH TO DATE CULTURE WILL BE HELD FOR 5 DAYS BEFORE ISSUING A FINAL NEGATIVE REPORT     [facility]formed at [facility]   Report Status PENDING   Incomplete  CULTURE, BLOOD (ROUTINE X 2)     Status: None   Collection Time    04/06/14  2:53 AM      Result Value Ref Range Status   Specimen Description BLOOD LEFT ANTECUBITAL   Final   Special Requests BOTTLES DRAWN AEROBIC AND ANAEROBIC 5CC   Final   Culture  Setup Time     Final   Value: 04/06/2014 14:08     [facility]formed at [facility]   Culture     Final   Value:        BLOOD CULTURE RECEIVED NO GROWTH TO DATE CULTURE WILL BE HELD FOR 5 DAYS BEFORE ISSUING A FINAL NEGATIVE REPORT     [facility]formed at [facility]   Report Status PENDING   Incomplete  MRSA PCR SCREENING     Status: None   Collection Time    04/06/14  5:23 AM      Result Value Ref Range Status   MRSA by PCR NEGATIVE  NEGATIVE Final   Comment:            The GeneXpert MRSA Assay (FDA     approved for NASAL specimens     only), is one component of a     comprehensive MRSA colonization     surveillance program. It is not     intended to diagnose MRSA     infection nor to guide or     monitor treatment for     MRSA infections.  CULTURE, EXPECTORATED SPUTUM-ASSESSMENT     Status: None   Collection  Time    04/06/14  6:00 AM      Result Value Ref Range Status   Specimen Description SPUTUM   Final   Special Requests Normal   Final   Sputum evaluation     Final   Value: THIS SPECIMEN IS ACCEPTABLE. RESPIRATORY CULTURE REPORT TO FOLLOW.   Report Status 04/06/2014 FINAL   Final  CULTURE, RESPIRATORY (NON-EXPECTORATED)     Status:  None   Collection Time    04/06/14  6:00 AM      Result Value Ref Range Status   Specimen Description SPUTUM   Final   Special Requests NONE   Final   Gram Stain     Final   Value: ABUNDANT WBC PRESENT, PREDOMINANTLY PMN     RARE SQUAMOUS EPITHELIAL CELLS PRESENT     RARE GRAM POSITIVE COCCI IN PAIRS     RARE GRAM POSITIVE RODS     Performed at [facility]   Culture PENDING   Incomplete   Report Status PENDING   Incomplete     Studies:  Recent x-ray studies have been reviewed in detail by the Attending Physician  Scheduled Meds:  Scheduled Meds: . azithromycin (ZITHROMAX) 500 MG IVPB  500 mg Intravenous Once  . azithromycin  500 mg Intravenous Q24H  . budesonide-formoterol  2 puff Inhalation BID  . cefTRIAXone (ROCEPHIN)  IV  1 g Intravenous Q24H  . dextromethorphan  30 mg Oral BID  . diltiazem  240 mg Oral Daily  . enoxaparin (LOVENOX) injection  60 mg Subcutaneous Q24H  . fluticasone  2 spray Each Nare Daily  . furosemide  40 mg Intravenous BID  . guaiFENesin  600 mg Oral BID  . ipratropium  0.5 mg Nebulization QID  . methylPREDNISolone (SOLU-MEDROL) injection  40 mg Intravenous Q12H  . pantoprazole (PROTONIX) IV  40 mg Intravenous QHS  . sodium chloride  3 mL Intravenous Q12H  . vancomycin  1,250 mg Intravenous Q12H  . vitamin A & D        Time spent on care of this patient: 40 mins   Drema Dallas , MD   [facility] Office  [phone] Pager - [phone]  On-Call/Text Page:      [email or web address]      password [email or web address]  If 7PM-7AM, please contact night-coverage [email or web address] Password [email or web address] 04/07/2014, 8:02 AM    LOS: 1 day

## 2014-04-07 NOTE — Progress Notes (Signed)
Pt wants CPAP between 2200 and 2300.  RT to monitor and assess as needed.

## 2014-04-08 ENCOUNTER — Encounter (HOSPITAL_COMMUNITY): Payer: Managed Care, Other (non HMO)

## 2014-04-08 ENCOUNTER — Inpatient Hospital Stay (HOSPITAL_COMMUNITY): Admit: 2014-04-08 | Payer: Managed Care, Other (non HMO)

## 2014-04-08 DIAGNOSIS — Z9119 Patient's noncompliance with other medical treatment and regimen: Secondary | ICD-10-CM

## 2014-04-08 DIAGNOSIS — K219 Gastro-esophageal reflux disease without esophagitis: Secondary | ICD-10-CM | POA: Diagnosis present

## 2014-04-08 DIAGNOSIS — Z91199 Patient's noncompliance with other medical treatment and regimen due to unspecified reason: Secondary | ICD-10-CM

## 2014-04-08 LAB — CBC WITH DIFFERENTIAL/PLATELET
Basophils Absolute: 0.2 K/uL — ABNORMAL HIGH (ref 0.0–0.1)
Basophils Relative: 1 % (ref 0–1)
Eosinophils Absolute: 0 K/uL (ref 0.0–0.7)
Eosinophils Relative: 0 % (ref 0–5)
HCT: 40 % (ref 36.0–46.0)
Hemoglobin: 13.3 g/dL (ref 12.0–15.0)
Lymphocytes Relative: 24 % (ref 12–46)
Lymphs Abs: 4.4 K/uL — ABNORMAL HIGH (ref 0.7–4.0)
MCH: 27.8 pg (ref 26.0–34.0)
MCHC: 33.3 g/dL (ref 30.0–36.0)
MCV: 83.5 fL (ref 78.0–100.0)
Monocytes Absolute: 1.7 K/uL — ABNORMAL HIGH (ref 0.1–1.0)
Monocytes Relative: 9 % (ref 3–12)
Neutro Abs: 12.2 K/uL — ABNORMAL HIGH (ref 1.7–7.7)
Neutrophils Relative %: 66 % (ref 43–77)
Platelets: 354 K/uL (ref 150–400)
RBC: 4.79 MIL/uL (ref 3.87–5.11)
RDW: 16.9 % — ABNORMAL HIGH (ref 11.5–15.5)
WBC: 18.5 K/uL — ABNORMAL HIGH (ref 4.0–10.5)

## 2014-04-08 LAB — COMPREHENSIVE METABOLIC PANEL
ALBUMIN: 3 g/dL — AB (ref 3.5–5.2)
ALK PHOS: 63 U/L (ref 39–117)
ALT: 15 U/L (ref 0–35)
AST: 12 U/L (ref 0–37)
BUN: 23 mg/dL (ref 6–23)
CHLORIDE: 97 meq/L (ref 96–112)
CO2: 31 mEq/L (ref 19–32)
Calcium: 8.9 mg/dL (ref 8.4–10.5)
Creatinine, Ser: 0.93 mg/dL (ref 0.50–1.10)
GFR calc Af Amer: 83 mL/min — ABNORMAL LOW (ref >90)
GFR calc non Af Amer: 72 mL/min — ABNORMAL LOW (ref >90)
Glucose, Bld: 117 mg/dL — ABNORMAL HIGH (ref 70–99)
POTASSIUM: 3.9 meq/L (ref 3.7–5.3)
SODIUM: 138 meq/L (ref 137–147)
Total Protein: 7.2 g/dL (ref 6.0–8.3)

## 2014-04-08 LAB — CULTURE, RESPIRATORY W GRAM STAIN: Culture: NORMAL

## 2014-04-08 LAB — PHOSPHORUS: Phosphorus: 4.7 mg/dL — ABNORMAL HIGH (ref 2.3–4.6)

## 2014-04-08 LAB — CULTURE, RESPIRATORY

## 2014-04-08 LAB — PROCALCITONIN: Procalcitonin: <0.1 ng/mL

## 2014-04-08 LAB — MAGNESIUM
Magnesium: 2.1 mg/dL (ref 1.5–2.5)
Magnesium: 2.1 mg/dL (ref 1.5–2.5)

## 2014-04-08 MED ORDER — LEVOFLOXACIN 750 MG PO TABS
750.0000 mg | ORAL_TABLET | Freq: Every day | ORAL | Status: DC
  Administered 2014-04-08 – 2014-04-09 (×2): 750 mg via ORAL
  Filled 2014-04-08 (×2): qty 1

## 2014-04-08 NOTE — Progress Notes (Signed)
Staff note   - this patient was seen by me yesterday. See my separate note  Dr. Kalman ShanMurali Trinka Keshishyan, M.D., [facility]C.P Pulmonary and Critical Care Medicine Staff Physician [place] [place] [facility] Critical [facility] Pager: [phone], If no answer or between  15:00h - 7:00h: call [phone]  04/08/2014 12:06 PM

## 2014-04-08 NOTE — Progress Notes (Signed)
   Name: Kathleen AbideLouise Rodriguez MRN: [id number] DOB: [birth year]    ADMISSION DATE:  04/06/2014 CONSULTATION DATE:  04/06/14  REFERRING MD :  [facility] PRIMARY SERVICE:  [facility]  CHIEF COMPLAINT:  SOB, h/o Asthma  BRIEF PATIENT DESCRIPTION: 48 yr old h/o Asthma, noncompliant OSA, admitted SOB 6/14  SIGNIFICANT EVENTS / STUDIES:  6/14- admitted for sob  Diagnostics ECHO 6/14: EF 65%, no wall motion abnormality. Dilated IVC. Tech difficult study   LINES / TUBES:  CULTURES: 6/14 sputum>>> 6/14 BC>>> 6/14 resp viral panel>>>  ANTIBIOTICS: 6/14 azithro>>>6/15 6/14 ceftriaxone>>>6/15 6/14 vanc>>>6/15 6/15 levaquin >>>    SUBJECTIVE:  Feels better VITAL SIGNS: Temp:  [97.5 F (36.4 C)-98.2 F (36.8 C)] 97.9 F (36.6 C) (06/16 0400) Pulse Rate:  [75-116] 82 (06/16 0800) Resp:  [17-33] 22 (06/16 0800) BP: (116-154)/(58-79) 118/73 mmHg (06/16 0611) SpO2:  [93 %-99 %] 97 % (06/16 0848) Weight:  [121.7 kg (268 lb 4.8 oz)] 121.7 kg (268 lb 4.8 oz) (06/16 0400) Room air  PHYSICAL EXAMINATION: General:  Mild distress to none Neuro:  Nonfocal, alert, o  x4 HEENT:  obese Neck:  Slight coarseness to expiration Cardiovascular:  s1 s2 RRR Lungs:  Clear, upper airway wheeze  Abdomen:  Soft, Bs wnl, obese Musculoskeletal:  Edema 1 plus Skin:  No rash   Recent Labs Lab 04/06/14 0200 04/07/14 0325 04/08/14 0747  NA 139 140 138  K 3.8 4.4 3.9  CL 102 103 97  CO2 21 24 31   BUN 17 18 23   CREATININE 0.85 1.05 0.93  GLUCOSE 110* 139* 117*    Recent Labs Lab 04/06/14 0157 04/08/14 0747  HGB 12.2 13.3  HCT 37.6 40.0  WBC 15.0* 18.5*  PLT 331 354   Dg Chest Port 1 View  04/07/2014   CLINICAL DATA:  Infiltrate .  EXAM: PORTABLE CHEST - 1 VIEW  COMPARISON:  04/06/2014 .  FINDINGS: Mediastinum and hilar structures are normal. Heart size and pulmonary vascularity stable. Minimal basilar atelectasis. No significant pulmonary infiltrate noted on today's exam. No pleural effusion or  pneumothorax. No acute osseus abnormality.  IMPRESSION: Minimal basilar atelectasis. No significant infiltrate noted on today's exam.   Electronically Signed   By: Maisie Fushomas  Register   On: 04/07/2014 07:48  no infiltrate. CXR essentially clear   ASSESSMENT / PLAN:  Astma Exacerbation Prob element of VCD/ upper airway cough syndrome (think this and HTN/transient edema are bigger issues than asthmatic exacerbation) OSA  H/o Gd I Diastolic dysfxn  GERD Medical non-compliance   Ace I stopped, not taking antihypertensives at all.  ECHO w/ EF 65%, no wall motion abnormalities  PCT neg  Feels better   Plan  -steroid taper    - focus on upper airway: sinus regimen and reflux  - home on brovana and budesonide. Seems to do better with this. She feels it irritates her less   -pulse ox  - rx htn (home on current rx)  - f/u resp viral panel  -could narrow to 5d course resp quinolone   -Needs CPAP at HS and at home  - f/u spiro   - we will see Monday (appointment made) June 22 at 2pm   04/08/2014, 9:15 AM

## 2014-04-08 NOTE — Evaluation (Signed)
Physical Therapy Evaluation Patient Details Name: Kathleen Rodriguez MRN: [id number] DOB: 02/08/1966 Today's Date: 04/08/2014   History of Present Illness  [age] yoo female admitted with asthma exacerbation. Hx of arthritis, HTN, asthma  Clinical Impression  On eval, pt was supervision level assist for mobility-able to ambulate ~275 feet. Slightly unsteady but pt attributes this to knee arthritis-occurs at baseline per pt. O2 sats 92% on RA during ambulation. Recommend daily mobility with nursing supervision. Will sign off.     Follow Up Recommendations No PT follow up    Equipment Recommendations  None recommended by PT    Recommendations for Other Services       Precautions / Restrictions Precautions Precautions: None Restrictions Weight Bearing Restrictions: No      Mobility  Bed Mobility Overal bed mobility: Modified Independent                Transfers Overall transfer level: Modified independent                  Ambulation/Gait Ambulation/Gait assistance: Supervision Ambulation Distance (Feet): 275 Feet Assistive device: None (intermittently held onto hallway railing) Gait Pattern/deviations: Step-through pattern     General Gait Details: slow gait speed. slightly unsteady-pt attributes this to knee arthritis bilaterally  Stairs            Wheelchair Mobility    Modified Rankin (Stroke Patients Only)       Balance                                             Pertinent Vitals/Pain 95% RA at rest 92% on RA during ambulation    Home Living Family/patient expects to be discharged to:: Private residence Living Arrangements: Spouse/significant other;Children Available Help at Discharge: Family   Home Access: Level entry     Home Layout: One level Home Equipment: None      Prior Function                 Hand Dominance   Dominant Hand: Right    Extremity/Trunk Assessment   Upper Extremity Assessment:  Overall WFL for tasks assessed           Lower Extremity Assessment: Generalized weakness      Cervical / Trunk Assessment: Normal  Communication   Communication: No difficulties  Cognition Arousal/Alertness: Awake/alert Behavior During Therapy: WFL for tasks assessed/performed Overall Cognitive Status: Within Functional Limits for tasks assessed                      General Comments      Exercises        Assessment/Plan    PT Assessment Patent does not need any further PT services  PT Diagnosis     PT Problem List    PT Treatment Interventions     PT Goals (Current goals can be found in the Care Plan section) Acute Rehab PT Goals Patient Stated Goal: home. return to work. PT Goal Formulation: No goals set, d/c therapy    Frequency     Barriers to discharge        Co-evaluation               End of Session   Activity Tolerance: Patient tolerated treatment well Patient left: in bed;with call bell/phone within reach  Time: [id number] PT Time Calculation (min): 8 min   Charges:   PT Evaluation $Initial PT Evaluation Tier I: 1 Procedure PT Treatments $Gait Training: 8-22 mins   PT G Codes:          Rebeca AlertJannie Porter, MPT Pager: [phone]

## 2014-04-08 NOTE — Progress Notes (Signed)
ANTIBIOTIC CONSULT NOTE - INITIAL  Pharmacy Consult for Levaquin Indication: CAP  Allergies  Allergen Reactions  . Other Hives and Itching    Ketchup  . Tomato Hives and Itching    Patient Measurements: Height: 5\' 5"  (165.1 cm) Weight: 268 lb 4.8 oz (121.7 kg) IBW/kg (Calculated) : 57  Vital Signs: Temp: 97.9 F (36.6 C) (06/16 0400) Temp src: Oral (06/16 0400) BP: 118/73 mmHg (06/16 0611) Pulse Rate: 82 (06/16 0800) Intake/Output from previous day: 06/15 0701 - 06/16 0700 In: 790 [P.O.:240; IV Piggyback:550] Out: 1900 [Urine:1900]  Labs:  Recent Labs  04/06/14 0157 04/06/14 0200 04/07/14 0325 04/08/14 0747  WBC 15.0*  --   --   --   HGB 12.2  --   --   --   PLT 331  --   --   --   CREATININE 0.80 0.85 1.05 0.93   Estimated Creatinine Clearance: 96.8 ml/min (by C-G formula based on Cr of 0.93).  Anti-infectives: 6/14 >>zmax >> 6/16 6/15 >>rocephin >> 6/16 6/14>>vancomycin>> 6/15 6/16 >> LVQ >>   Assessment: 47 yoF admitted 6/14 with cough and wheezing for 5 days. She went to Pulmonology with  c/o sore throat and wheezing and was started on prednisone taper. She continued to have worsening wheezing and presented to ER she was found to have left basilal infiltrate and persistent wheezing. Reports in April she had to be intubated due to PNA.  She was initially on vanc / ceftriaxone / azithromycin, but has now been narrowed.  Pharmacy consulted to dose Levaquin, Ok to use PO per MD.  6/16: Day #3 antibiotics total  Tmax: afebrile  WBCs: 15 (6/14)  Renal: SCr 1.05 (6/15), CrCl ~ 85 ml/min  Cultures negative to date.  Goal of Therapy:  Appropriate abx dosing, eradication of infection.   Plan:   Levaquin 750mg  PO q24h  Follow up renal fxn and culture results.   Lynann Beaverhristine Norvel Wenker PharmD, BCPS Pager [phone] 04/08/2014 8:58 AM

## 2014-04-08 NOTE — Progress Notes (Signed)
Progress Note  Kathleen Rodriguez ZOX:[id number] DOB: July 11, 1966 DOA: 04/06/2014 PCP: Lorenda Ishihara  Admit HPI / Brief Narrative: Kathleen Rodriguez is a 48 y.o. 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  Presented with Cough and wheezing for 5 days. She went to Pulmonology with complins of sore throat and wheezing and was started on prednisone taper. She continued to have worsening wheezing and presneted to ER she was found to have left basilal infiltrate and persistent wheezing. Reports in April she was intubated due to PNA. 6/15 A./O. x4, admits she is noncompliant with her medication and CPAP (never obtained a machine). Currently negative SOB, negative chest pain.    HPI/Subjective: 6/16 A./O. x4, states she did well overnight on her CPAP. Requested to know if we could provide her with medication upon discharge, explained that we could provide prescriptions for medications. Currently negative SOB, negative chest pain.\   Assessment/Plan: Asthma exacerbation - Will DC prednisone 40 mg a day (patient will have 3 days of steroids); not fully convinced this is a asthma exacerbation.  -Continue Mucinex DM BID -Continue albuterol, Arformoterol, and budesonide nebulizer   -6/16 appears PCCM will obtain spirometry (laryngoscopy?) Part of patient being discharged   -Respiratory virus panel pending -Continue incentive spirometry -PT/OT consulted  CAP (community acquired pneumonia) - DC ceftriaxone,+ azithromycin, start levofloxacin per PCCM recommendation -oxygen therapy to maintain SpO2 >92 percent  -blood cultures pending   Hypertension continue home medications  -Continue diltiazem 240 mg daily  -Continue metoprolol 12.5 mg  BID -proBNP= 103.4 -Continue Lasix 40 mg BID -Admission weight bed = 122.7 kg , 6/16 bed weight= 121.7  kg  GERD -Patient with possible silent GERD causing/exacerbating VCD continue Protonix 40 mg BID -6/16 possible laryngoscopy by PCCM. Today?  OSA -Counseled patient in detail the sequela of continuing to not use CPAP to include increased risk of MI, CVA, death -CPAP per respiratory  Chest pain - musculoskeletal in origin secondary to cough    Code Status: FULL Family Communication: no family present at time of exam Disposition Plan: Resolution of SOB   Consultants: Dr. Rory Percy (PCCM.)    Procedure/Significant Events: 6/14 echocardiogram - Left ventricle: moderate LVH.  -LVEF= 65%- 70%.  - Left atrium:  mildly dilated. - Right ventricle:  normal.  - Impressions: The study is technically very limited.     Culture 6/14 blood right forearm/left antecubital NTD 6/14 sputum pending 6/14 MRSA by PCR negative   Antibiotics: Ceftriaxone 6/14>> stopped 6/16 Azithromycin 6/14>> stopped 6/16 Levofloxacin 6/16   DVT prophylaxis: Lovenox   Devices NA   LINES / TUBES:   6/14 20ga right hand   Continuous Infusions:   Objective: VITAL SIGNS: Temp: 97.9 F (36.6 C) (06/16 0400) Temp src: Oral (06/16 0400) BP: 118/73 mmHg (06/16 0611) Pulse Rate: 77 (06/16 0611) SPO2; 96% on room air FIO2:   Intake/Output Summary (Last 24 hours) at 04/08/14 0819 Last data filed at 04/08/14 0604  Gross per 24 hour  Intake    790 ml  Output   1900 ml  Net  -1110 ml     Exam: General: A./O. x4, NAD, No acute respiratory distress Lungs: Clear to auscultation bilaterally without wheezes or crackles Cardiovascular: Regular rate and rhythm without murmur gallop or rub normal S1 and S2 Abdomen: Nontender, nondistended, soft, bowel sounds positive, no rebound, no ascites, no appreciable mass Extremities: No significant  cyanosis, clubbing, bilateral lower extremities 2+ edema mid calf  Data Reviewed: Basic Metabolic Panel:  Recent Labs Lab 04/06/14 0157  04/06/14 0200 04/07/14 0325 04/08/14 0325  NA 140 139 140  --   K 3.7 3.8 4.4  --   CL 105 102 103  --   CO2 23 21 24   --   GLUCOSE 116* 110* 139*  --   BUN 17 17 18   --   CREATININE 0.80 0.85 1.05  --   CALCIUM 8.5 8.7 9.2  --   MG  --   --   --  2.1  PHOS  --   --   --  4.7*   Liver Function Tests:  Recent Labs Lab 04/06/14 0200 04/07/14 0325  AST 16 9  ALT 11 12  ALKPHOS 63 62  BILITOT <0.2* <0.2*  PROT 7.2 7.2  ALBUMIN 3.3* 3.0*   No results found for this basename: LIPASE, AMYLASE,  in the last 168 hours No results found for this basename: AMMONIA,  in the last 168 hours CBC:  Recent Labs Lab 04/06/14 0157  WBC 15.0*  NEUTROABS 10.2*  HGB 12.2  HCT 37.6  MCV 84.9  PLT 331   Cardiac Enzymes: No results found for this basename: CKTOTAL, CKMB, CKMBINDEX, TROPONINI,  in the last 168 hours BNP (last 3 results)  Recent Labs  03/02/14 1726 04/07/14 0325  PROBNP 32.5 103.4   CBG: No results found for this basename: GLUCAP,  in the last 168 hours  Recent Results (from the past 240 hour(s))  CULTURE, BLOOD (ROUTINE X 2)     Status: None   Collection Time    04/06/14  2:47 AM      Result Value Ref Range Status   Specimen Description BLOOD RIGHT FOREARM   Final   Special Requests BOTTLES DRAWN AEROBIC AND ANAEROBIC 5CC   Final   Culture  Setup Time     Final   Value: 04/06/2014 14:08     Performed at [facility]   Culture     Final   Value:        BLOOD CULTURE RECEIVED NO GROWTH TO DATE CULTURE WILL BE HELD FOR 5 DAYS BEFORE ISSUING A FINAL NEGATIVE REPORT     Performed at [facility]   Report Status PENDING   Incomplete  CULTURE, BLOOD (ROUTINE X 2)     Status: None   Collection Time    04/06/14  2:53 AM      Result Value Ref Range Status   Specimen Description BLOOD LEFT ANTECUBITAL   Final   Special Requests BOTTLES DRAWN AEROBIC AND ANAEROBIC 5CC   Final   Culture  Setup Time     Final   Value: 04/06/2014 14:08     Performed  at [facility]   Culture     Final   Value:        BLOOD CULTURE RECEIVED NO GROWTH TO DATE CULTURE WILL BE HELD FOR 5 DAYS BEFORE ISSUING A FINAL NEGATIVE REPORT     Performed at [facility]   Report Status PENDING   Incomplete  MRSA PCR SCREENING     Status: None   Collection Time    04/06/14  5:23 AM      Result Value Ref Range Status   MRSA by PCR NEGATIVE  NEGATIVE Final   Comment:            The GeneXpert MRSA Assay (FDA  approved for NASAL specimens     only), is one component of a     comprehensive MRSA colonization     surveillance program. It is not     intended to diagnose MRSA     infection nor to guide or     monitor treatment for     MRSA infections.  CULTURE, EXPECTORATED SPUTUM-ASSESSMENT     Status: None   Collection Time    04/06/14  6:00 AM      Result Value Ref Range Status   Specimen Description SPUTUM   Final   Special Requests Normal   Final   Sputum evaluation     Final   Value: THIS SPECIMEN IS ACCEPTABLE. RESPIRATORY CULTURE REPORT TO FOLLOW.   Report Status 04/06/2014 FINAL   Final  CULTURE, RESPIRATORY (NON-EXPECTORATED)     Status: None   Collection Time    04/06/14  6:00 AM      Result Value Ref Range Status   Specimen Description SPUTUM   Final   Special Requests NONE   Final   Gram Stain     Final   Value: ABUNDANT WBC PRESENT, PREDOMINANTLY PMN     RARE SQUAMOUS EPITHELIAL CELLS PRESENT     RARE GRAM POSITIVE COCCI IN PAIRS     RARE GRAM POSITIVE RODS     Performed at [facility]   Culture     Final   Value: Culture reincubated for better growth     Performed at [facility]   Report Status PENDING   Incomplete     Studies:  Recent x-ray studies have been reviewed in detail by the Attending Physician  Scheduled Meds:  Scheduled Meds: . arformoterol  15 mcg Nebulization BID  . azithromycin  500 mg Intravenous Q24H  . budesonide (PULMICORT) nebulizer solution  0.5 mg Nebulization BID  .  cefTRIAXone (ROCEPHIN)  IV  1 g Intravenous Q24H  . dextromethorphan  30 mg Oral BID  . diltiazem  240 mg Oral Daily  . enoxaparin (LOVENOX) injection  60 mg Subcutaneous Q24H  . fluticasone  2 spray Each Nare Daily  . guaiFENesin  600 mg Oral BID  . metoprolol tartrate  12.5 mg Oral BID  . pantoprazole  40 mg Oral BID AC  . predniSONE  40 mg Oral Q breakfast    Time spent on care of this patient: 40 mins   Drema DallasWOODS, CURTIS, J , MD   [facility] Office  [phone] Pager - [phone]  On-Call/Text Page:      [email or web address]      password [email or web address]  If 7PM-7AM, please contact night-coverage [email or web address] Password [email or web address] 04/08/2014, 8:19 AM   LOS: 2 days

## 2014-04-08 NOTE — Progress Notes (Signed)
Staff note  - she is better. Likely VCD. PCCM will sign off   Dr. Kalman ShanMurali Ramaswamy, M.D., [facility]P Pulmonary and Critical Care Medicine Staff Physician [facility] Pager: [phone], If no answer or between  15:00h - 7:00h: call [phone]  04/08/2014 12:07 PM

## 2014-04-08 NOTE — Progress Notes (Signed)
Pt O2 sats on RA while sitting in the room without activity is 98% on RA. Pt O2 sats while ambulating is 92% on RA.

## 2014-04-09 ENCOUNTER — Encounter (HOSPITAL_COMMUNITY): Payer: Managed Care, Other (non HMO)

## 2014-04-09 DIAGNOSIS — B3731 Acute candidiasis of vulva and vagina: Secondary | ICD-10-CM | POA: Diagnosis not present

## 2014-04-09 DIAGNOSIS — B373 Candidiasis of vulva and vagina: Secondary | ICD-10-CM | POA: Diagnosis not present

## 2014-04-09 LAB — RESPIRATORY VIRUS PANEL
Adenovirus: NOT DETECTED
INFLUENZA A H3: NOT DETECTED
INFLUENZA A: NOT DETECTED
INFLUENZA B 1: NOT DETECTED
Influenza A H1: NOT DETECTED
METAPNEUMOVIRUS: NOT DETECTED
PARAINFLUENZA 1 A: NOT DETECTED
PARAINFLUENZA 3 A: NOT DETECTED
Parainfluenza 2: NOT DETECTED
RESPIRATORY SYNCYTIAL VIRUS A: NOT DETECTED
Respiratory Syncytial Virus B: NOT DETECTED
Rhinovirus: NOT DETECTED

## 2014-04-09 LAB — PHOSPHORUS: Phosphorus: 4.9 mg/dL — ABNORMAL HIGH (ref 2.3–4.6)

## 2014-04-09 LAB — MAGNESIUM: Magnesium: 2.1 mg/dL (ref 1.5–2.5)

## 2014-04-09 MED ORDER — LEVOFLOXACIN 750 MG PO TABS: 750.0000 mg | ORAL_TABLET | Freq: Every day | ORAL | Status: AC

## 2014-04-09 MED ORDER — DEXTROMETHORPHAN POLISTIREX 30 MG/5ML PO LQCR: 30.0000 mg | Freq: Two times a day (BID) | ORAL | Status: DC

## 2014-04-09 MED ORDER — ALBUTEROL SULFATE HFA 108 (90 BASE) MCG/ACT IN AERS
2.0000 | INHALATION_SPRAY | RESPIRATORY_TRACT | Status: DC
  Filled 2014-04-09: qty 6.7

## 2014-04-09 MED ORDER — CLOTRIMAZOLE 1 % VA CREA: 1.0000 | TOPICAL_CREAM | Freq: Every day | VAGINAL | Status: DC

## 2014-04-09 MED ORDER — OMEPRAZOLE 40 MG PO CPDR: 40.0000 mg | DELAYED_RELEASE_CAPSULE | Freq: Every day | ORAL | Status: DC

## 2014-04-09 MED ORDER — PREDNISONE 10 MG PO TABS: ORAL_TABLET | ORAL | Status: DC

## 2014-04-09 NOTE — Progress Notes (Signed)
Discharge to home, husband at bedside, d/c instructions and follow up appointments done and  given to the patient, verbalized understanding.Albuterol  and Symbacort Inhaler was given to the patient  Able  to use albuterol 2 puff sprior to discharge. PIV removed no s/s of infiltration or swelling noted,

## 2014-04-09 NOTE — Discharge Instructions (Signed)

## 2014-04-09 NOTE — Discharge Summary (Signed)
Physician Discharge Summary  Atlee AbideLouise Elrod ZOX:[id number] DOB: [birth year] DOA: 04/06/2014  PCP: Lorenda IshiharaVaradarajan, Rupashree  Admit date: 04/06/2014 Discharge date: 04/09/2014  Time spent: 40 minutes  Recommendations for Outpatient Follow-up:  1. D/c home with outpt PCP and pulmonary follow up 2. Patient willneed samples of rescue inhaler and steroid inhaler during pulmonary folow up as she doesn't have enough co pay to purchase inhalers. 3. Please follow Respiratory viral panel upon outpt follow up  Discharge Diagnoses:   Principle problem: asthma exacerbation and upper airway cough syndrome  Active Problems:   OSA (obstructive sleep apnea)   Hypertension   Chest pain   Asthma exacerbation   CAP (community acquired pneumonia)   Medically noncompliant   GERD (gastroesophageal reflux disease)  Obesity Vaginal candidiasis  Discharge Condition: fair  Diet recommendation: cardiac  Filed Weights   04/06/14 0530 04/07/14 0400 04/08/14 0400  Weight: 122.7 kg (270 lb 8.1 oz) 121.5 kg (267 lb 13.7 oz) 121.7 kg (268 lb 4.8 oz)    History of present illness:  Please refer to admission H&P for details, but in brief, 48 year old obese female with history of asthma, upper airway cough syndrome, hypertension, arthritis sleep apnea noncompliant with CPAP, noncompliance to medications and was admitted 2 months back for pneumonia requiring intubation presented with cough and wheezing for 5 days. She visited pulmonary clinic and was started on prednisone taper however given worsening symptoms she presented to the ED pediatrist found to have a left basilar infiltrate and persistent wheezing and was admitted to medical floor. Pulmonary was consulted.  Hospital Course:  Asthma exacerbation Patient placed on prednisone, albuterol nebs, and antitussive. She was seen by pulmonary and started on rule out nebulizer and Pulmicort nebulizer. Symptoms have started to improve and there is a likely component of  upper airway cough syndrome as well. Respiratory viral panel was ordered which needs to be followed up as outpatient She will be discharged on prednisone taper over next 12 days. She was recommended to continue home inhalers however reports that her insurance will not cover. She will continue albuterol nebulizer as needed. She has an appointment with pulmonary the on 6/22. I have discussed with pulmonary consult over the phone and she will be provided with sample rescue inhalers during outpatient visit.  Community-acquired pneumonia On oral Levaquin. We plan to treat for 5 days course. (2 more days) Blood culture on admission was negative. Sputum culture showed mixed flora. Urine  For strep pneumonia and legionella were negative.   GERD We'll discharge her on PPI  Hypertension Continue Cardizem. Discontinue ACE inhibitor given persistent cough  Obstructive sleep apnea Patient counseled on compliance with CPAP. She plans to call her home health agency who ordered her CPAP.  Vaginal candidiasis We'll discharge her on Lotrimin vaginal cream once daily for 7 days  Obesity Counseled on monitoring her diet and exercise to lose weight.  Patient is clinically stable for discharge with outpatient PCP and pulmonary followup.  Procedures:  None  Consultations:  Pulmonary  Discharge Exam: Filed Vitals:   04/09/14 0418  BP: 102/64  Pulse: 78  Temp: 97.4 F (36.3 C)  Resp: 18    General: Middle aged obese female in no acute distress HEENT: No pallor, moist oral mucosa Chest: Clear to auscultation bilaterally, bladder sounds CVS: Normal S1-S2, no murmurs rub or gallop Abdomen: Soft, nontender, nondistended, bowel sounds present Extremities: Warm, no edema CNS: AAO x3   Discharge Instructions You were cared for by a hospitalist during your hospital stay.  If you have any questions about your discharge medications or the care you received while you were in the hospital after you are  discharged, you can call the unit and asked to speak with the hospitalist on call if the hospitalist that took care of you is not available. Once you are discharged, your primary care physician will handle any further medical issues. Please note that NO REFILLS for any discharge medications will be authorized once you are discharged, as it is imperative that you return to your primary care physician (or establish a relationship with a primary care physician if you do not have one) for your aftercare needs so that they can reassess your need for medications and monitor your lab values.     Medication List    STOP taking these medications       lisinopril 10 MG tablet  Commonly known as:  PRINIVIL,ZESTRIL      TAKE these medications       albuterol 108 (90 BASE) MCG/ACT inhaler  Commonly known as:  VENTOLIN HFA  Inhale 2 puffs into the lungs every 6 (six) hours as needed for wheezing.     albuterol (2.5 MG/3ML) 0.083% nebulizer solution  Commonly known as:  PROVENTIL  Take 3 mLs (2.5 mg total) by nebulization every 6 (six) hours as needed for wheezing. DX 493.90     budesonide-formoterol 160-4.5 MCG/ACT inhaler  Commonly known as:  SYMBICORT  Inhale 2 puffs into the lungs 2 (two) times daily.     dextromethorphan 30 MG/5ML liquid  Commonly known as:  DELSYM  Take 5 mLs (30 mg total) by mouth 2 (two) times daily.     diltiazem 240 MG 24 hr capsule  Commonly known as:  CARDIZEM CD  Take 1 capsule (240 mg total) by mouth daily.     ferrous fumarate 325 (106 FE) MG Tabs tablet  Commonly known as:  HEMOCYTE - 106 mg FE  Take 1 tablet by mouth every other day.     fluticasone 50 MCG/ACT nasal spray  Commonly known as:  FLONASE  Place 2 sprays into the nose daily.     levofloxacin 750 MG tablet  Commonly known as:  LEVAQUIN  Take 1 tablet (750 mg total) by mouth daily. For 2 days     omeprazole 40 MG capsule  Commonly known as:  PRILOSEC  Take 1 capsule (40 mg total) by mouth  daily.     predniSONE 10 MG tablet  Commonly known as:  DELTASONE  Take 40mg  daily for 3 days, then 30mg  daily for 3 days, then 20mg  daily for 3 days, then 10mg  daily for 3 days, then stop     Vitamin D (Ergocalciferol) 50000 UNITS Caps capsule  Commonly known as:  DRISDOL  Take 50,000 Units by mouth every 7 (seven) days.        Lotrimin 1% vaginal cream, apply daily for 7 days    Allergies  Allergen Reactions  . Other Hives and Itching    Ketchup  . Tomato Hives and Itching       Follow-up Information   Follow up with PARRETT,TAMMY, NP On 04/14/2014. (2 pm )    Specialty:  Nurse Practitioner   Contact information:   [address] [place] [address] [phone]       Follow up with Chales Salmon, Rupashree. Schedule an appointment as soon as possible for a visit in 1 week.   Specialty:  Internal Medicine   Contact information:  [address] [place] [address] [phone]        The results of significant diagnostics from this hospitalization (including imaging, microbiology, ancillary and laboratory) are listed below for reference.    Significant Diagnostic Studies: Dg Chest Port 1 View  04/07/2014   CLINICAL DATA:  Infiltrate .  EXAM: PORTABLE CHEST - 1 VIEW  COMPARISON:  04/06/2014 .  FINDINGS: Mediastinum and hilar structures are normal. Heart size and pulmonary vascularity stable. Minimal basilar atelectasis. No significant pulmonary infiltrate noted on today's exam. No pleural effusion or pneumothorax. No acute osseus abnormality.  IMPRESSION: Minimal basilar atelectasis. No significant infiltrate noted on today's exam.   Electronically Signed   By: Maisie Fushomas  Register   On: 04/07/2014 07:48   Dg Chest Port 1 View  04/06/2014   CLINICAL DATA:  Chest pain, shortness of breath and cough. History of smoking and asthma.  EXAM: PORTABLE CHEST - 1 VIEW  COMPARISON:  Chest radiograph performed 03/02/2014  FINDINGS: The lungs are well-aerated. Left basilar  airspace opacity raises concern for mild pneumonia. There is no evidence of focal pleural effusion or pneumothorax.  The cardiomediastinal silhouette is within normal limits. No acute osseous abnormalities are seen.  IMPRESSION: Left basilar airspace opacity raises concern for mild pneumonia.   Electronically Signed   By: Roanna RaiderJeffery  Chang M.D.   On: 04/06/2014 02:01    Microbiology: Recent Results (from the past 240 hour(s))  CULTURE, BLOOD (ROUTINE X 2)     Status: None   Collection Time    04/06/14  2:47 AM      Result Value Ref Range Status   Specimen Description BLOOD RIGHT FOREARM   Final   Special Requests BOTTLES DRAWN AEROBIC AND ANAEROBIC 5CC   Final   Culture  Setup Time     Final   Value: 04/06/2014 14:08     Performed at [facility]   Culture     Final   Value:        BLOOD CULTURE RECEIVED NO GROWTH TO DATE CULTURE WILL BE HELD FOR 5 DAYS BEFORE ISSUING A FINAL NEGATIVE REPORT     Performed at [facility]   Report Status PENDING   Incomplete  CULTURE, BLOOD (ROUTINE X 2)     Status: None   Collection Time    04/06/14  2:53 AM      Result Value Ref Range Status   Specimen Description BLOOD LEFT ANTECUBITAL   Final   Special Requests BOTTLES DRAWN AEROBIC AND ANAEROBIC 5CC   Final   Culture  Setup Time     Final   Value: 04/06/2014 14:08     Performed at [facility]   Culture     Final   Value:        BLOOD CULTURE RECEIVED NO GROWTH TO DATE CULTURE WILL BE HELD FOR 5 DAYS BEFORE ISSUING A FINAL NEGATIVE REPORT     Performed at [facility]   Report Status PENDING   Incomplete  MRSA PCR SCREENING     Status: None   Collection Time    04/06/14  5:23 AM      Result Value Ref Range Status   MRSA by PCR NEGATIVE  NEGATIVE Final   Comment:            The GeneXpert MRSA Assay (FDA     approved for NASAL specimens     only), is one component of a     comprehensive MRSA  colonization     surveillance program. It is not     intended to  diagnose MRSA     infection nor to guide or     monitor treatment for     MRSA infections.  CULTURE, EXPECTORATED SPUTUM-ASSESSMENT     Status: None   Collection Time    04/06/14  6:00 AM      Result Value Ref Range Status   Specimen Description SPUTUM   Final   Special Requests Normal   Final   Sputum evaluation     Final   Value: THIS SPECIMEN IS ACCEPTABLE. RESPIRATORY CULTURE REPORT TO FOLLOW.   Report Status 04/06/2014 FINAL   Final  CULTURE, RESPIRATORY (NON-EXPECTORATED)     Status: None   Collection Time    04/06/14  6:00 AM      Result Value Ref Range Status   Specimen Description SPUTUM   Final   Special Requests NONE   Final   Gram Stain     Final   Value: ABUNDANT WBC PRESENT, PREDOMINANTLY PMN     RARE SQUAMOUS EPITHELIAL CELLS PRESENT     RARE GRAM POSITIVE COCCI IN PAIRS     RARE GRAM POSITIVE RODS     Performed at [facility]   Culture     Final   Value: NORMAL OROPHARYNGEAL FLORA     Performed at [facility]   Report Status 04/08/2014 FINAL   Final     Labs: Basic Metabolic Panel:  Recent Labs Lab 04/06/14 0157 04/06/14 0200 04/07/14 0325 04/08/14 0325 04/08/14 0747 04/09/14 0339  NA 140 139 140  --  138  --   K 3.7 3.8 4.4  --  3.9  --   CL 105 102 103  --  97  --   CO2 23 21 24   --  31  --   GLUCOSE 116* 110* 139*  --  117*  --   BUN 17 17 18   --  23  --   CREATININE 0.80 0.85 1.05  --  0.93  --   CALCIUM 8.5 8.7 9.2  --  8.9  --   MG  --   --   --  2.1 2.1 2.1  PHOS  --   --   --  4.7*  --  4.9*   Liver Function Tests:  Recent Labs Lab 04/06/14 0200 04/07/14 0325 04/08/14 0747  AST 16 9 12   ALT 11 12 15   ALKPHOS 63 62 63  BILITOT <0.2* <0.2* <0.2*  PROT 7.2 7.2 7.2  ALBUMIN 3.3* 3.0* 3.0*   No results found for this basename: LIPASE, AMYLASE,  in the last 168 hours No results found for this basename: AMMONIA,  in the last 168 hours CBC:  Recent Labs Lab 04/06/14 0157 04/08/14 0747  WBC 15.0* 18.5*   NEUTROABS 10.2* 12.2*  HGB 12.2 13.3  HCT 37.6 40.0  MCV 84.9 83.5  PLT 331 354   Cardiac Enzymes: No results found for this basename: CKTOTAL, CKMB, CKMBINDEX, TROPONINI,  in the last 168 hours BNP: BNP (last 3 results)  Recent Labs  03/02/14 1726 04/07/14 0325  PROBNP 32.5 103.4   CBG: No results found for this basename: GLUCAP,  in the last 168 hours     Signed:  DHUNGEL, NISHANT  [facility] 04/09/2014, 10:40 AM

## 2014-04-10 ENCOUNTER — Telehealth: Payer: Self-pay | Admitting: Pulmonary Disease

## 2014-04-10 NOTE — Telephone Encounter (Signed)
Spoke with pt and advised that we do not have samples of Prilosec but it can be purchased otc now.  Pt verbalized understanding.

## 2014-04-12 LAB — CULTURE, BLOOD (ROUTINE X 2)
CULTURE: NO GROWTH
Culture: NO GROWTH

## 2014-04-14 ENCOUNTER — Encounter: Payer: Self-pay | Admitting: Adult Health

## 2014-04-14 ENCOUNTER — Ambulatory Visit (INDEPENDENT_AMBULATORY_CARE_PROVIDER_SITE_OTHER): Payer: Managed Care, Other (non HMO) | Admitting: Adult Health

## 2014-04-14 ENCOUNTER — Ambulatory Visit (INDEPENDENT_AMBULATORY_CARE_PROVIDER_SITE_OTHER)
Admission: RE | Admit: 2014-04-14 | Discharge: 2014-04-14 | Disposition: A | Discharge status: Home / Self Care | Payer: Managed Care, Other (non HMO) | Source: Ambulatory Visit | Attending: Adult Health | Admitting: Adult Health

## 2014-04-14 VITALS — BP 128/76 | HR 103 | Temp 98.0°F | Ht 66.0 in | Wt 274.8 lb

## 2014-04-14 DIAGNOSIS — J189 Pneumonia, unspecified organism: Secondary | ICD-10-CM

## 2014-04-14 DIAGNOSIS — J45901 Unspecified asthma with (acute) exacerbation: Secondary | ICD-10-CM

## 2014-04-14 DIAGNOSIS — G4733 Obstructive sleep apnea (adult) (pediatric): Secondary | ICD-10-CM

## 2014-04-14 MED ORDER — AEROCHAMBER MV MISC: Status: DC

## 2014-04-14 NOTE — Patient Instructions (Addendum)
Finish Prednisone taper as directed.  Continue on Symbicort 2 puff twice per day, and rinse mouth after using Bring your formulary to next visit.  Proair 2 puffs  as needed for cough, wheeze, or chest congestion Will call DME to check on CPAP status.  Follow up Dr. Craige CottaSood  In 4 weeks and As needed   Please contact office for sooner follow up if symptoms do not improve or worsen or seek emergency care  Order sent to DME for shower chair.

## 2014-04-14 NOTE — Progress Notes (Signed)
   Subjective:    Patient ID: Kathleen Rodriguez, female    DOB: [birth year], 48 y.o.   MRN: [id number]  HPI 48 yo former smoker with asthma, OSA, and admission for influenza pneumonia in April 2014.  04/14/2014 [facility] follow up  Pt was admitted 6/14 -6/17 asthma exacerbation complicated by CAP tx w/ aggressive abx , steroids and nebs .  Discharged on Levaquin and steroid taper.  Finished Levaquin . Currently on prednisone taper.  Has insurance , does not cover Symbicort . We discussed her bringing in her formulary. She was given samples  To last her until return ov . She is to bring formulary back to office.  Resp panel was neg. ACE was stopped d/t cough.  Awaiting DME for CPAP status . Wants order to DME for shower chair.  Since discharge breathing is imrpoved w/ less cough/wheeznig but still weak.     TESTS: 02/05/13 CT chest >> no PE, no acute findings 02/11/13 Labs >> ANA negative, Anti-GBM Ab < 1, ANCA negative 02/14/13 Echo >> EF 65-70%, mild LVH, grade 1 diastolic dysfx PSG 2/44/01 >> AHI 16.4, SpO2 low 84%. PFT 05/15/13 >> FEV1 1.90 (76%), FEV1% 72, TLC 4.23 (81%), DLCO 89%, no BD    Review of Systems Constitutional:   No  weight loss, night sweats,  Fevers, chills,  +fatigue, or  lassitude.  HEENT:   No headaches,  Difficulty swallowing,  Tooth/dental problems, or  Sore throat,                No sneezing, itching, ear ache, nasal congestion, post nasal drip,   CV:  No chest pain,  Orthopnea, PND, swelling in lower extremities, anasarca, dizziness, palpitations, syncope.   GI  No heartburn, indigestion, abdominal pain, nausea, vomiting, diarrhea, change in bowel habits, loss of appetite, bloody stools.   Resp:    No chest wall deformity  Skin: no rash or lesions.  GU: no dysuria, change in color of urine, no urgency or frequency.  No flank pain, no hematuria   MS:  No joint pain or swelling.  No decreased range of motion.  No back pain.  Psych:  No change in mood  or affect. No depression or anxiety.  No memory loss.         Objective:   Physical Exam GEN: A/Ox3; pleasant , NAD, elderly , obese   HEENT:  [place]/AT,  EACs-clear, TMs-wnl, NOSE-clear, THROAT-clear, no lesions, no postnasal drip or exudate noted.   NECK:  Supple w/ fair ROM; no JVD; normal carotid impulses w/o bruits; no thyromegaly or nodules palpated; no lymphadenopathy.  RESP  Diminished BS in bases , .no accessory muscle use, no dullness to percussion  CARD:  RRR, no m/r/g  , tr peripheral edema, pulses intact, no cyanosis or clubbing.  GI:   Soft & nt; nml bowel sounds; no organomegaly or masses detected.  Musco: Warm bil, no deformities or joint swelling noted.   Neuro: alert, no focal deficits noted.    Skin: Warm, no lesions or rashes   CXR 04/14/14 >No active cardiopulmonary disease.       Assessment & Plan:

## 2014-04-15 ENCOUNTER — Telehealth: Payer: Self-pay | Admitting: Pulmonary Disease

## 2014-04-15 ENCOUNTER — Encounter: Payer: Self-pay | Admitting: *Deleted

## 2014-04-15 NOTE — Telephone Encounter (Signed)
Letter done and up front for pick up  Spoke with the pt and notified her of this  Nothing further needed

## 2014-04-15 NOTE — Telephone Encounter (Signed)
Spoke with the pt  She is asking status of work note  I advised that TP is seeing pts today, and we will call her back once she has a chance to address this  Pt verbalized understanding

## 2014-04-15 NOTE — Telephone Encounter (Signed)
Spoke with the pt  She is requesting work note excusing her until Monday 6/27  Tammy, please advise thanks!

## 2014-04-15 NOTE — Telephone Encounter (Signed)
Pt is calling back again about her work note

## 2014-04-15 NOTE — Telephone Encounter (Signed)
Per TP: that is fine.  Thanks.

## 2014-04-23 NOTE — Assessment & Plan Note (Addendum)
Resolving flare   Plan  Finish Prednisone taper as directed.  Continue on Symbicort 2 puff twice per day, and rinse mouth after using -samples given  Bring your formulary to next visit.  Proair 2 puffs  as needed for cough, wheeze, or chest congestion Follow up Dr. Craige CottaSood  In 4 weeks and As needed   Please contact office for sooner follow up if symptoms do not improve or worsen or seek emergency care

## 2014-04-23 NOTE — Assessment & Plan Note (Signed)
Cleared on cxr   Plan No further abx indicated

## 2014-04-24 ENCOUNTER — Telehealth: Payer: Self-pay | Admitting: *Deleted

## 2014-04-24 NOTE — Telephone Encounter (Signed)
Order was sent at the 6.22.15 ov w/ TP [facility] to have [facility] check on the status of pt's CPAP Called [facility] and spoke with Erica > pt noshowed for her appt w/ them on 7.1.15 to have her CPAP machine set up.  Pt rescheduled this to 7.3.15.  This was for documentation only; will sign off.

## 2014-04-24 NOTE — Telephone Encounter (Signed)
Message copied by Lowell BoutonJONES, JESSICA E on Thu Apr 24, 2014  2:45 PM ------      Message from: Julio SicksPARRETT, TAMMY S      Created: Wed Apr 23, 2014 11:51 AM       Did we hear if this lady got her CPAP or not ?             TP ------

## 2014-05-01 ENCOUNTER — Encounter: Payer: Self-pay | Admitting: Pulmonary Disease

## 2014-05-01 ENCOUNTER — Ambulatory Visit (INDEPENDENT_AMBULATORY_CARE_PROVIDER_SITE_OTHER): Payer: Managed Care, Other (non HMO) | Admitting: Pulmonary Disease

## 2014-05-01 VITALS — BP 140/88 | HR 97 | Ht 66.0 in | Wt 275.0 lb

## 2014-05-01 DIAGNOSIS — B373 Candidiasis of vulva and vagina: Secondary | ICD-10-CM

## 2014-05-01 DIAGNOSIS — R059 Cough, unspecified: Secondary | ICD-10-CM

## 2014-05-01 DIAGNOSIS — R058 Other specified cough: Secondary | ICD-10-CM

## 2014-05-01 DIAGNOSIS — R053 Chronic cough: Secondary | ICD-10-CM | POA: Insufficient documentation

## 2014-05-01 DIAGNOSIS — B3731 Acute candidiasis of vulva and vagina: Secondary | ICD-10-CM

## 2014-05-01 DIAGNOSIS — G4733 Obstructive sleep apnea (adult) (pediatric): Secondary | ICD-10-CM

## 2014-05-01 DIAGNOSIS — K219 Gastro-esophageal reflux disease without esophagitis: Secondary | ICD-10-CM

## 2014-05-01 DIAGNOSIS — J189 Pneumonia, unspecified organism: Secondary | ICD-10-CM

## 2014-05-01 DIAGNOSIS — R05 Cough: Secondary | ICD-10-CM | POA: Insufficient documentation

## 2014-05-01 DIAGNOSIS — J45901 Unspecified asthma with (acute) exacerbation: Secondary | ICD-10-CM

## 2014-05-01 MED ORDER — MONTELUKAST SODIUM 10 MG PO TABS: 10.0000 mg | ORAL_TABLET | Freq: Every day | ORAL | Status: DC

## 2014-05-01 MED ORDER — FLUCONAZOLE 100 MG PO TABS: 100.0000 mg | ORAL_TABLET | Freq: Every day | ORAL | Status: DC

## 2014-05-01 NOTE — Patient Instructions (Signed)
Fluconazole 100 mg daily for 7 days Finish course of prednisone Montelukast (singulair) 10 mg pill nightly  Stop symbicort Albuterol up to four times per day as needed for cough, wheeze, or chest congestion Check with your human resource department about how to apply for short term/long term disability Continue flonase two sprays each nostril daily Try using over the counter antihistamine( allegra, claritin, or zyrtec) daily for allergies Follow up in 2 to 3 weeks with Dr. Craige CottaSood or Tammy Parrett

## 2014-05-01 NOTE — Progress Notes (Signed)
Chief Complaint  Patient presents with  . Follow-up    Pt states that she returned to work x 1 week ago. Pt states that she is using CPAP nightly. C/o some depression d/t breathing issues. States no change in SOB. Pt currently taking Prednisone taper--30mg  this AM. Requests abx or diflucan to counteract yeast/fungal infection from  prednisone.    History of Present Illness: Kathleen Rodriguez is a 48 y.o. female former smoker with chronic cough, upper airway cough syndrome, possible asthma, OSA, and admission for influenza pneumonia in April 2014.  She has noticed more trouble with her breathing over the past several weeks.  She has been to ER several times.  She was also hospitalized.  There was concern she had pneumonia versus atelectasis on CXR.  She was seen by pulmonary in hospital and main concern was for VCD.  She has finished Abx, and is on tapering course of prednisone.  She does not feel like prednisone or inhaler make much difference.  She gets hoarse when she uses symbicort.  She is using albuterol several times per day >> this does not much much either.  She has developed a yeast infection from using prednisone.  She has been feeling more depressed because of issues with her breathing.  She is getting sinus congestion with post-nasal drip.  She denies smoking cigarettes.  She is not having much trouble with reflux.  She gets wheeze, but this happens mostly in her throat and upper chest.  She is using flonase and nasal irrigation.  She has started using CPAP, and this helps.  She is no longer snoring.  She has nasal pillows >> this causes nose irritation.  She is going to switch to nasal mask.  TESTS: 02/05/13 CT chest >> no PE, no acute findings 02/11/13 Labs >> ANA negative, Anti-GBM Ab < 1, ANCA negative PSG 04/19/13 >> AHI 16.4, SpO2 low 84%. 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  Kathleen Rodriguez  has past surgical history that includes Tubal ligation and Cesarean section.  Prior to Admission medications   Medication Sig Start Date End Date Taking? Authorizing Provider  albuterol (PROVENTIL) (2.5 MG/3ML) 0.083% nebulizer solution Take 3 mLs (2.5 mg total) by nebulization every 6 (six) hours as needed for wheezing. DX 493.92 02/27/13  Yes Chesley Mires, MD  budesonide-formoterol (SYMBICORT) 160-4.5 MCG/ACT inhaler Inhale 2 puffs into the lungs 2 (two) times daily. 02/21/13  Yes Marijean Heath, NP  diltiazem (CARDIZEM CD) 240 MG 24 hr capsule Take 1 capsule (240 mg total) by mouth daily. 02/21/13  Yes Marijean Heath, NP  fluticasone (FLONASE) 50 MCG/ACT nasal spray Place 2 sprays into the nose daily. 02/21/13  Yes Marijean Heath, NP  albuterol (PROVENTIL HFA;VENTOLIN HFA) 108 (90 BASE) MCG/ACT inhaler Inhale 2 puffs into the lungs every 6 (six) hours as needed for wheezing.    Historical Provider, MD    Allergies  Allergen Reactions  . Other Hives and Itching    Ketchup  . Tomato Hives and Itching     Physical Exam:  General - No distress ENT - No sinus tenderness, MP 3, no oral exudate, no LAN Cardiac - s1s2 regular, no murmur Chest - no wheeze/rales Back - No  focal tenderness Abd - Soft, non-tender Ext - No edema Neuro - Normal strength Skin - No rashes Psych - normal mood, and behavior  Dg Chest 2 View  04/14/2014   CLINICAL DATA:  Followup pneumonia.  EXAM: CHEST  2 VIEW  COMPARISON:  None.  FINDINGS: The heart size and mediastinal contours are within normal limits. Both lungs are clear. The visualized skeletal structures are unremarkable.  IMPRESSION: No active cardiopulmonary disease.   Electronically Signed   By: Lajean Manes M.D.   On: 04/14/2014 14:10   Assessment/Plan:  Chesley Mires, MD [facility] Pager:  [phone]

## 2014-05-01 NOTE — Assessment & Plan Note (Signed)
She has not respond to typical asthma therapy.  I am concerned her cough is likely related to alternative cause.  I will try to augment her sinus regimen first.  If this is unsuccessful, then she will need further evaluation with CT sinus/ENT evaluation and possibly further GI evaluation for reflux.  We had length discussion about whether she would qualify for disability.  Will give her excuse from work until she is re-evaluated in few weeks.  Advised her to d/w her HR department about the process for applying for disability.

## 2014-05-01 NOTE — Assessment & Plan Note (Signed)
Related to prednisone use.  Will give her course of fluconazole.

## 2014-05-01 NOTE — Assessment & Plan Note (Signed)
Difficulty to determine whether her persistent symptoms are related to asthma.  Her lack of response to appropriate asthma therapy speaks against this being the cause.  She is to continue her current course of prednisone to completion, and then will re-assess of this.  I am also concerned that symbicort might be contributing to upper airway irritation.  Will have her stop this.  Will have her use montelukast and prn albuterol for now.

## 2014-05-01 NOTE — Assessment & Plan Note (Signed)
Will have her continue nasal irrigation and flonase.  Will have her use montelukast and OTC anti-histamine.  If she is not improved, then will need further ENT evaluation.

## 2014-05-01 NOTE — Assessment & Plan Note (Signed)
Resolved on most recent chest xray.  She does not need additional antibiotics at this time.

## 2014-05-01 NOTE — Assessment & Plan Note (Signed)
If her cough symptoms persist, then she may need additional GI evaluation for reflux.

## 2014-05-01 NOTE — Assessment & Plan Note (Signed)
Since I last saw her she has been started on CPAP.  She reports compliance with CPAP and benefit from using CPAP.  Her main issue is with mask fit >> she will check with her DME about getting a new mask.

## 2014-05-15 ENCOUNTER — Inpatient Hospital Stay (HOSPITAL_COMMUNITY)
Admission: EM | Admit: 2014-05-15 | Discharge: 2014-05-17 | DRG: 202 | Disposition: A | Discharge status: Home / Self Care | Payer: Managed Care, Other (non HMO) | Attending: Internal Medicine | Admitting: Internal Medicine

## 2014-05-15 ENCOUNTER — Telehealth: Payer: Self-pay | Admitting: Pulmonary Disease

## 2014-05-15 ENCOUNTER — Emergency Department (HOSPITAL_COMMUNITY): Payer: Managed Care, Other (non HMO)

## 2014-05-15 ENCOUNTER — Encounter (HOSPITAL_COMMUNITY): Payer: Self-pay | Admitting: Emergency Medicine

## 2014-05-15 DIAGNOSIS — Z6841 Body Mass Index (BMI) 40.0 and over, adult: Secondary | ICD-10-CM

## 2014-05-15 DIAGNOSIS — E872 Acidosis, unspecified: Secondary | ICD-10-CM | POA: Diagnosis present

## 2014-05-15 DIAGNOSIS — B373 Candidiasis of vulva and vagina: Secondary | ICD-10-CM | POA: Diagnosis present

## 2014-05-15 DIAGNOSIS — M129 Arthropathy, unspecified: Secondary | ICD-10-CM | POA: Diagnosis present

## 2014-05-15 DIAGNOSIS — I10 Essential (primary) hypertension: Secondary | ICD-10-CM | POA: Diagnosis present

## 2014-05-15 DIAGNOSIS — B3731 Acute candidiasis of vulva and vagina: Secondary | ICD-10-CM | POA: Diagnosis present

## 2014-05-15 DIAGNOSIS — R7309 Other abnormal glucose: Secondary | ICD-10-CM | POA: Diagnosis present

## 2014-05-15 DIAGNOSIS — Z8249 Family history of ischemic heart disease and other diseases of the circulatory system: Secondary | ICD-10-CM

## 2014-05-15 DIAGNOSIS — R000 Tachycardia, unspecified: Secondary | ICD-10-CM | POA: Diagnosis present

## 2014-05-15 DIAGNOSIS — J45901 Unspecified asthma with (acute) exacerbation: Principal | ICD-10-CM | POA: Diagnosis present

## 2014-05-15 DIAGNOSIS — Z87891 Personal history of nicotine dependence: Secondary | ICD-10-CM

## 2014-05-15 DIAGNOSIS — G4733 Obstructive sleep apnea (adult) (pediatric): Secondary | ICD-10-CM | POA: Diagnosis present

## 2014-05-15 DIAGNOSIS — R0781 Pleurodynia: Secondary | ICD-10-CM

## 2014-05-15 LAB — CBC WITH DIFFERENTIAL/PLATELET
Basophils Absolute: 0.1 10*3/uL (ref 0.0–0.1)
Basophils Relative: 1 % (ref 0–1)
Eosinophils Absolute: 0.6 10*3/uL (ref 0.0–0.7)
Eosinophils Relative: 7 % — ABNORMAL HIGH (ref 0–5)
HEMATOCRIT: 42.1 % (ref 36.0–46.0)
HEMOGLOBIN: 13.5 g/dL (ref 12.0–15.0)
LYMPHS PCT: 47 % — AB (ref 12–46)
Lymphs Abs: 4.2 10*3/uL — ABNORMAL HIGH (ref 0.7–4.0)
MCH: 27.6 pg (ref 26.0–34.0)
MCHC: 32.1 g/dL (ref 30.0–36.0)
MCV: 86.1 fL (ref 78.0–100.0)
MONO ABS: 0.7 10*3/uL (ref 0.1–1.0)
MONOS PCT: 8 % (ref 3–12)
NEUTROS ABS: 3.3 10*3/uL (ref 1.7–7.7)
Neutrophils Relative %: 37 % — ABNORMAL LOW (ref 43–77)
Platelets: 326 10*3/uL (ref 150–400)
RBC: 4.89 MIL/uL (ref 3.87–5.11)
RDW: 17 % — ABNORMAL HIGH (ref 11.5–15.5)
WBC: 8.8 10*3/uL (ref 4.0–10.5)

## 2014-05-15 LAB — COMPREHENSIVE METABOLIC PANEL
ALT: 15 U/L (ref 0–35)
AST: 16 U/L (ref 0–37)
Albumin: 3.6 g/dL (ref 3.5–5.2)
Alkaline Phosphatase: 71 U/L (ref 39–117)
Anion gap: 11 (ref 5–15)
BUN: 18 mg/dL (ref 6–23)
CO2: 25 meq/L (ref 19–32)
Calcium: 9.3 mg/dL (ref 8.4–10.5)
Chloride: 103 mEq/L (ref 96–112)
Creatinine, Ser: 0.94 mg/dL (ref 0.50–1.10)
GFR calc Af Amer: 82 mL/min — ABNORMAL LOW (ref >90)
GFR, EST NON AFRICAN AMERICAN: 71 mL/min — AB (ref >90)
Glucose, Bld: 96 mg/dL (ref 70–99)
Potassium: 4.3 mEq/L (ref 3.7–5.3)
Sodium: 139 mEq/L (ref 137–147)
Total Protein: 7.6 g/dL (ref 6.0–8.3)

## 2014-05-15 LAB — TROPONIN I

## 2014-05-15 MED ORDER — MAGNESIUM SULFATE 50 % IJ SOLN: 2.0000 g | Freq: Once | INTRAMUSCULAR | Status: DC

## 2014-05-15 MED ORDER — ALBUTEROL (5 MG/ML) CONTINUOUS INHALATION SOLN
10.0000 mg/h | INHALATION_SOLUTION | RESPIRATORY_TRACT | Status: DC
  Administered 2014-05-15: 10 mg/h via RESPIRATORY_TRACT
  Filled 2014-05-15: qty 20

## 2014-05-15 MED ORDER — METHYLPREDNISOLONE SODIUM SUCC 125 MG IJ SOLR
125.0000 mg | Freq: Once | INTRAMUSCULAR | Status: AC
  Administered 2014-05-15: 125 mg via INTRAVENOUS
  Filled 2014-05-15: qty 2

## 2014-05-15 MED ORDER — IPRATROPIUM-ALBUTEROL 0.5-2.5 (3) MG/3ML IN SOLN
3.0000 mL | Freq: Once | RESPIRATORY_TRACT | Status: AC
  Administered 2014-05-15: 3 mL via RESPIRATORY_TRACT
  Filled 2014-05-15: qty 3

## 2014-05-15 MED ORDER — MAGNESIUM SULFATE 40 MG/ML IJ SOLN
2.0000 g | Freq: Once | INTRAMUSCULAR | Status: AC
  Administered 2014-05-15: 2 g via INTRAVENOUS
  Filled 2014-05-15: qty 50

## 2014-05-15 NOTE — Telephone Encounter (Signed)
Last OV 05-01-14. I spoke with the pt and she sounded very SOB over the phone. She would have to stop every 2-3 words to take a breath. She states x 5 days she has been having an increase in productive cough with clear phlegm, increased SOB, chest tightness and wheezing. I could hear wheezing over the phone. The pt states she has some left over prednisone so she took 4 tablets on 7-20 and then 3 tablets on 7-21, and 7-22. She states that this did not help. The pt states at rest her SOB is better, but with any movement she gets SOB. The pt states she cannot go to the ER because she has her 2 young sons at home with her and she cannot leave them. Her husband is at work and can not come home until 4:30. She states when he comes home she will go to the ER if needed. I advised I will send a message to VS to see if he has any recs at this time other then go to the ER. Carron CurieJennifer Castillo, CMA Allergies  Allergen Reactions  . Other Hives and Itching    Ketchup  . Tomato Hives and Itching

## 2014-05-15 NOTE — Telephone Encounter (Signed)
Called spoke with pt. Aware of VS recs. She will go to the ED right now. Nothing further needed

## 2014-05-15 NOTE — ED Provider Notes (Signed)
CSN: [id number]     Arrival date & time 05/15/14  2218 History   First MD Initiated Contact with Patient 05/15/14 2258     Chief Complaint  Patient presents with  . Shortness of Breath  . Chest Pain     (Consider location/radiation/quality/duration/timing/severity/associated sxs/prior Treatment) HPI Patient presents with 4 days of increased shortness of breath, wheezing, chest tightness and cough productive of clear sputum. Denies any fever chills. Patient has history of asthma for which she is required multiple admissions including intubation and ICU admissions. She used her home nebulizer with little relief. Denies any lower sugary swelling or pain. Past Medical History  Diagnosis Date  . Arthritis   . Asthma   . Hypertension   . Influenza B April 2014    Complicated by multi-organ failure  . 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 OSA (obstructive sleep apnea) 03/20/2013   Past Surgical History  Procedure Laterality Date  . Tubal ligation    . Cesarean section     Family History  Problem Relation Age of Onset  . Hypertension Mother   . HIV/AIDS Father    History  Substance Use Topics  . Smoking status: Former Smoker -- 0.25 packs/day for 20 years    Types: Cigarettes    Quit date: 01/31/2013  . Smokeless tobacco: Never Used  . Alcohol Use: No   OB History   Grav Para Term Preterm Abortions TAB SAB Ect Mult Living                 Review of Systems  Constitutional: Negative for fever and chills.  Respiratory: Positive for cough, chest tightness, shortness of breath and wheezing.   Cardiovascular: Negative for chest pain, palpitations and leg swelling.  Gastrointestinal: Negative for nausea, vomiting, abdominal pain, diarrhea and constipation.  Musculoskeletal: Negative for back pain, myalgias, neck pain and neck stiffness.  Skin: Negative for rash and wound.  Neurological: Negative for dizziness, weakness, light-headedness, numbness and headaches.  All other  systems reviewed and are negative.     Allergies  Other and Tomato  Home Medications   Prior to Admission medications   Medication Sig Start Date End Date Taking? Authorizing Provider  albuterol (PROVENTIL) (2.5 MG/3ML) 0.083% nebulizer solution Take 3 mLs (2.5 mg total) by nebulization every 6 (six) hours as needed for wheezing. DX 493.90 04/02/14   Leslye Peer, MD  albuterol (VENTOLIN HFA) 108 (90 BASE) MCG/ACT inhaler Inhale 2 puffs into the lungs every 6 (six) hours as needed for wheezing. 07/03/13   [name], MD  clotrimazole (GYNE-LOTRIMIN) 1 % vaginal cream Place 1 Applicatorful vaginally at bedtime. 04/09/14   Nishant Dhungel, MD  dextromethorphan (DELSYM) 30 MG/5ML liquid Take 5 mLs (30 mg total) by mouth 2 (two) times daily. 04/09/14   Nishant Dhungel, MD  diltiazem (CARDIZEM CD) 240 MG 24 hr capsule Take 1 capsule (240 mg total) by mouth daily. 02/21/13   Bernadene Person, NP  ferrous fumarate (HEMOCYTE - 106 MG FE) 325 (106 FE) MG TABS Take 1 tablet by mouth every other day.     Historical Provider, MD  fluconazole (DIFLUCAN) 100 MG tablet Take 1 tablet (100 mg total) by mouth daily. 05/01/14   [name], MD  fluticasone (FLONASE) 50 MCG/ACT nasal spray Place 2 sprays into the nose daily. 02/21/13   Bernadene Person, NP  montelukast (SINGULAIR) 10 MG tablet Take 1 tablet (10 mg total) by mouth at bedtime. 05/01/14   [name], MD  predniSONE (DELTASONE) 10 MG tablet Take 40mg  daily for 3 days, then 30mg  daily for 3 days, then 20mg  daily for 3 days, then 10mg  daily for 3 days, then stop 04/09/14   Eddie NorthNishant Dhungel, MD  Spacer/Aero-Holding Chambers (AEROCHAMBER MV) inhaler Use as instructed 04/14/14   Tammy S Parrett, NP  Vitamin D, Ergocalciferol, (DRISDOL) 50000 UNITS CAPS Take 50,000 Units by mouth every 7 (seven) days.    Historical Provider, MD   BP 122/96  Pulse 111  Temp(Src) 98.2 F (36.8 C) (Oral)  Resp 22  SpO2 95%  LMP 05/03/2014 Physical Exam  Nursing note  and vitals reviewed. Constitutional: She is oriented to person, place, and time. She appears well-developed and well-nourished. She appears distressed (mild respiratory distress).  HENT:  Head: Normocephalic and atraumatic.  Mouth/Throat: Oropharynx is clear and moist.  Eyes: EOM are normal. Pupils are equal, round, and reactive to light.  Neck: Normal range of motion. Neck supple.  Cardiovascular: Normal rate and regular rhythm.   Pulmonary/Chest: No respiratory distress. She has wheezes (inspiratory and expiratory wheezing in all lung fields). She has no rales. She exhibits no tenderness.  Speaking in full sentences  Abdominal: Soft. Bowel sounds are normal. She exhibits no distension and no mass. There is no tenderness. There is no rebound and no guarding.  Musculoskeletal: Normal range of motion. She exhibits no edema and no tenderness.  No calf swelling or tenderness.  Neurological: She is alert and oriented to person, place, and time.  Moves all extremities without deficit. Sensation is grossly intact.  Skin: Skin is warm and dry. No rash noted. No erythema.  Psychiatric: She has a normal mood and affect. Her behavior is normal.    ED Course  Procedures (including critical care time) Labs Review Labs Reviewed  CBC WITH DIFFERENTIAL  COMPREHENSIVE METABOLIC PANEL  TROPONIN I    Imaging Review No results found.   EKG Interpretation   Date/Time:  Thursday May 15 2014 22:33:48 EDT Ventricular Rate:  92 PR Interval:  120 QRS Duration: 87 QT Interval:  348 QTC Calculation: 430 R Axis:   80 Text Interpretation:  Sinus rhythm Minimal ST depression, inferior leads  Confirmed by Narya Beavin  MD, Muadh Creasy ([id number]) on 05/16/2014 12:05:32 AM      MDM   Final diagnoses:  None   Persistent expiratory wheezing though inspiratory wheezing is improved. Patient appears to be more comfortable.   Patient continues to have chest tightness and asked for a wheezing despite 2 continuous  nebulized treatments. Discuss with hospitalist and we'll admit for asthma exacerbation and chest pain rule out. I believe the chest pain is likely respiratory related. It is worse with deep inspiration and coughing.  Loren Raceravid Michail Boyte, MD 05/16/14 [phone]

## 2014-05-15 NOTE — ED Notes (Signed)
Pt a+ox4, reports SOB worsening over the last few days, unrelieved by home meds.  Pt very SOB on initial contact.  Speaking 1-2 word sentences, pursed lip breathing, tripod position, audible wheezing noted.  Pt also reports midsternal CP, 10/10.  Nonradiating, reproducible with cough and deep inspiration.  Pt reports cough prod clear sputum.  Denies fevers/chills.  Denies other complaints.  Skin PWD.  MAEI.

## 2014-05-15 NOTE — Telephone Encounter (Signed)
She should go to the ER for evaluation if she is having this much difficulty.

## 2014-05-16 ENCOUNTER — Encounter (HOSPITAL_COMMUNITY): Payer: Self-pay | Admitting: *Deleted

## 2014-05-16 DIAGNOSIS — R0781 Pleurodynia: Secondary | ICD-10-CM

## 2014-05-16 DIAGNOSIS — J45901 Unspecified asthma with (acute) exacerbation: Principal | ICD-10-CM

## 2014-05-16 LAB — CBC
HCT: 37.9 % (ref 36.0–46.0)
Hemoglobin: 12 g/dL (ref 12.0–15.0)
MCH: 27.1 pg (ref 26.0–34.0)
MCHC: 31.7 g/dL (ref 30.0–36.0)
MCV: 85.7 fL (ref 78.0–100.0)
Platelets: 284 10*3/uL (ref 150–400)
RBC: 4.42 MIL/uL (ref 3.87–5.11)
RDW: 17 % — AB (ref 11.5–15.5)
WBC: 11.4 10*3/uL — AB (ref 4.0–10.5)

## 2014-05-16 LAB — GLUCOSE, CAPILLARY
Glucose-Capillary: 116 mg/dL — ABNORMAL HIGH (ref 70–99)
Glucose-Capillary: 165 mg/dL — ABNORMAL HIGH (ref 70–99)
Glucose-Capillary: 145 mg/dL — ABNORMAL HIGH (ref 70–99)

## 2014-05-16 LAB — TROPONIN I
Troponin I: <0.3 ng/mL (ref <0.30)
Troponin I: <0.3 ng/mL (ref <0.30)

## 2014-05-16 LAB — COMPREHENSIVE METABOLIC PANEL
ALBUMIN: 3.2 g/dL — AB (ref 3.5–5.2)
ALK PHOS: 59 U/L (ref 39–117)
ALT: 13 U/L (ref 0–35)
AST: 9 U/L (ref 0–37)
Anion gap: 19 — ABNORMAL HIGH (ref 5–15)
BUN: 17 mg/dL (ref 6–23)
CHLORIDE: 100 meq/L (ref 96–112)
CO2: 18 mEq/L — ABNORMAL LOW (ref 19–32)
Calcium: 8.7 mg/dL (ref 8.4–10.5)
Creatinine, Ser: 0.79 mg/dL (ref 0.50–1.10)
GFR calc Af Amer: >90 mL/min (ref >90)
GFR calc non Af Amer: >90 mL/min (ref >90)
Glucose, Bld: 265 mg/dL — ABNORMAL HIGH (ref 70–99)
POTASSIUM: 3 meq/L — AB (ref 3.7–5.3)
SODIUM: 137 meq/L (ref 137–147)
Total Protein: 7 g/dL (ref 6.0–8.3)

## 2014-05-16 LAB — D-DIMER, QUANTITATIVE (NOT AT ARMC)

## 2014-05-16 MED ORDER — MORPHINE SULFATE 2 MG/ML IJ SOLN: 1.0000 mg | INTRAMUSCULAR | Status: DC | PRN

## 2014-05-16 MED ORDER — DILTIAZEM HCL ER COATED BEADS 240 MG PO CP24
240.0000 mg | ORAL_CAPSULE | Freq: Every day | ORAL | Status: DC
  Administered 2014-05-16 – 2014-05-17 (×2): 240 mg via ORAL
  Filled 2014-05-16 (×2): qty 1

## 2014-05-16 MED ORDER — SODIUM CHLORIDE 0.9 % IV SOLN
INTRAVENOUS | Status: DC
  Administered 2014-05-16 (×3): via INTRAVENOUS

## 2014-05-16 MED ORDER — ALBUTEROL (5 MG/ML) CONTINUOUS INHALATION SOLN
10.0000 mg/h | INHALATION_SOLUTION | RESPIRATORY_TRACT | Status: DC
Start: 2014-05-16 — End: 2014-05-16
  Administered 2014-05-16: 10 mg/h via RESPIRATORY_TRACT

## 2014-05-16 MED ORDER — IPRATROPIUM-ALBUTEROL 0.5-2.5 (3) MG/3ML IN SOLN
3.0000 mL | Freq: Four times a day (QID) | RESPIRATORY_TRACT | Status: DC
  Administered 2014-05-17 (×2): 3 mL via RESPIRATORY_TRACT
  Filled 2014-05-16 (×2): qty 3

## 2014-05-16 MED ORDER — METHYLPREDNISOLONE SODIUM SUCC 125 MG IJ SOLR
60.0000 mg | Freq: Four times a day (QID) | INTRAMUSCULAR | Status: DC
  Administered 2014-05-16 – 2014-05-17 (×5): 60 mg via INTRAVENOUS
  Filled 2014-05-16 (×9): qty 0.96

## 2014-05-16 MED ORDER — ENOXAPARIN SODIUM 40 MG/0.4ML ~~LOC~~ SOLN
40.0000 mg | SUBCUTANEOUS | Status: DC
  Administered 2014-05-17: 40 mg via SUBCUTANEOUS
  Filled 2014-05-16 (×2): qty 0.4

## 2014-05-16 MED ORDER — ALBUTEROL SULFATE (2.5 MG/3ML) 0.083% IN NEBU
2.5000 mg | INHALATION_SOLUTION | RESPIRATORY_TRACT | Status: DC | PRN
  Administered 2014-05-16: 2.5 mg via RESPIRATORY_TRACT

## 2014-05-16 MED ORDER — ONDANSETRON HCL 4 MG PO TABS: 4.0000 mg | ORAL_TABLET | Freq: Four times a day (QID) | ORAL | Status: DC | PRN

## 2014-05-16 MED ORDER — FLUCONAZOLE 200 MG PO TABS
200.0000 mg | ORAL_TABLET | Freq: Once | ORAL | Status: AC
  Administered 2014-05-16: 200 mg via ORAL
  Filled 2014-05-16: qty 1

## 2014-05-16 MED ORDER — VITAMIN D (ERGOCALCIFEROL) 1.25 MG (50000 UNIT) PO CAPS
50000.0000 [IU] | ORAL_CAPSULE | ORAL | Status: DC
  Administered 2014-05-16: 50000 [IU] via ORAL
  Filled 2014-05-16: qty 1

## 2014-05-16 MED ORDER — BISACODYL 10 MG RE SUPP: 10.0000 mg | Freq: Every day | RECTAL | Status: DC | PRN

## 2014-05-16 MED ORDER — ACETAMINOPHEN 650 MG RE SUPP: 650.0000 mg | Freq: Four times a day (QID) | RECTAL | Status: DC | PRN

## 2014-05-16 MED ORDER — ACETAMINOPHEN 325 MG PO TABS
650.0000 mg | ORAL_TABLET | Freq: Four times a day (QID) | ORAL | Status: DC | PRN
  Administered 2014-05-16: 650 mg via ORAL
  Filled 2014-05-16: qty 2

## 2014-05-16 MED ORDER — IPRATROPIUM-ALBUTEROL 0.5-2.5 (3) MG/3ML IN SOLN
3.0000 mL | Freq: Four times a day (QID) | RESPIRATORY_TRACT | Status: DC
  Administered 2014-05-16: 3 mL via RESPIRATORY_TRACT
  Filled 2014-05-16: qty 3

## 2014-05-16 MED ORDER — ONDANSETRON HCL 4 MG/2ML IJ SOLN: 4.0000 mg | Freq: Four times a day (QID) | INTRAMUSCULAR | Status: DC | PRN

## 2014-05-16 MED ORDER — DEXTROSE 5 % IV SOLN
500.0000 mg | Freq: Every day | INTRAVENOUS | Status: DC
  Administered 2014-05-16 – 2014-05-17 (×2): 500 mg via INTRAVENOUS
  Filled 2014-05-16 (×2): qty 500

## 2014-05-16 MED ORDER — FLUTICASONE PROPIONATE 50 MCG/ACT NA SUSP
2.0000 | Freq: Every day | NASAL | Status: DC
  Administered 2014-05-16 – 2014-05-17 (×2): 2 via NASAL
  Filled 2014-05-16: qty 16

## 2014-05-16 MED ORDER — INSULIN ASPART 100 UNIT/ML ~~LOC~~ SOLN
0.0000 [IU] | Freq: Three times a day (TID) | SUBCUTANEOUS | Status: DC
  Administered 2014-05-16: 2 [IU] via SUBCUTANEOUS
  Administered 2014-05-17: 1 [IU] via SUBCUTANEOUS

## 2014-05-16 MED ORDER — PANTOPRAZOLE SODIUM 40 MG PO TBEC
40.0000 mg | DELAYED_RELEASE_TABLET | Freq: Two times a day (BID) | ORAL | Status: DC
  Administered 2014-05-16 – 2014-05-17 (×3): 40 mg via ORAL
  Filled 2014-05-16 (×4): qty 1

## 2014-05-16 MED ORDER — FERROUS FUMARATE 325 (106 FE) MG PO TABS
1.0000 | ORAL_TABLET | ORAL | Status: DC
  Administered 2014-05-16: 106 mg via ORAL
  Filled 2014-05-16: qty 1

## 2014-05-16 MED ORDER — IPRATROPIUM-ALBUTEROL 0.5-2.5 (3) MG/3ML IN SOLN
3.0000 mL | RESPIRATORY_TRACT | Status: DC
  Administered 2014-05-16 (×3): 3 mL via RESPIRATORY_TRACT
  Filled 2014-05-16 (×3): qty 3

## 2014-05-16 MED ORDER — IPRATROPIUM-ALBUTEROL 0.5-2.5 (3) MG/3ML IN SOLN: 3.0000 mL | RESPIRATORY_TRACT | Status: DC | PRN

## 2014-05-16 MED ORDER — ASPIRIN EC 81 MG PO TBEC
81.0000 mg | DELAYED_RELEASE_TABLET | Freq: Every day | ORAL | Status: DC
  Administered 2014-05-16 – 2014-05-17 (×2): 81 mg via ORAL
  Filled 2014-05-16 (×2): qty 1

## 2014-05-16 MED ORDER — SODIUM CHLORIDE 0.9 % IJ SOLN
3.0000 mL | Freq: Two times a day (BID) | INTRAMUSCULAR | Status: DC
  Administered 2014-05-16 (×2): 3 mL via INTRAVENOUS

## 2014-05-16 NOTE — H&P (Signed)
Kathleen Rodriguez is an 48 y.o. female.   Chief Complaint: Shortness of breath, wheezing, and chest pain. HPI: Pt is a 48 yr old woman who states that she has had increasing shortness of breath and wheezing which has been worsening over the past 4 days. She has had a cough which is non productive.  The chest pain is more of a tightness located in a band across the center of the patient's chest.  It is 3-4/10 in severity now that her breathing is a little better.  It is not associated with nausea or diaphoresis.   Past Medical History  Diagnosis Date  . Arthritis   . Asthma   . Hypertension   . Influenza B April 1610    Complicated by multi-organ failure  . 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 OSA (obstructive sleep apnea) 03/20/2013    Past Surgical History  Procedure Laterality Date  . Tubal ligation    . Cesarean section      Family History  Problem Relation Age of Onset  . Hypertension Mother   . HIV/AIDS Father    Social History:  reports that she quit smoking about 15 months ago. Her smoking use included Cigarettes. She has a 5 pack-year smoking history. She has never used smokeless tobacco. She reports that she does not drink alcohol or use illicit drugs.  Allergies:  Allergies  Allergen Reactions  . Other Hives and Itching    Ketchup  . Tomato Hives and Itching    Medications Prior to Admission  Medication Sig Dispense Refill  . albuterol (PROVENTIL) (2.5 MG/3ML) 0.083% nebulizer solution Take 3 mLs (2.5 mg total) by nebulization every 6 (six) hours as needed for wheezing. DX 493.90  120 mL  0  . albuterol (VENTOLIN HFA) 108 (90 BASE) MCG/ACT inhaler Inhale 2 puffs into the lungs every 6 (six) hours as needed for wheezing.  1 Inhaler  0  . diltiazem (CARDIZEM CD) 240 MG 24 hr capsule Take 1 capsule (240 mg total) by mouth daily.  30 capsule  0  . ferrous fumarate (HEMOCYTE - 106 MG FE) 325 (106 FE) MG TABS Take 1 tablet by mouth every other day.       . fluticasone  (FLONASE) 50 MCG/ACT nasal spray Place 2 sprays into the nose daily.  16 g  0  . predniSONE (DELTASONE) 10 MG tablet Take 65m daily for 3 days, then 339mdaily for 3 days, then 2025maily for 3 days, then 4m59mily for 3 days, then stop  30 tablet  0  . Vitamin D, Ergocalciferol, (DRISDOL) 50000 UNITS CAPS Take 50,000 Units by mouth every 7 (seven) days. On thursdays.      . SpMarland Kitchencer/Aero-Holding Chambers (AEROCHAMBER MV) inhaler Use as instructed  1 each  0    Results for orders placed during the hospital encounter of 05/15/14 (from the past 48 hour(s))  CBC WITH DIFFERENTIAL     Status: Abnormal   Collection Time    05/15/14 11:06 PM      Result Value Ref Range   WBC 8.8  4.0 - 10.5 K/uL   RBC 4.89  3.87 - 5.11 MIL/uL   Hemoglobin 13.5  12.0 - 15.0 g/dL   HCT 42.1  36.0 - 46.0 %   MCV 86.1  78.0 - 100.0 fL   MCH 27.6  26.0 - 34.0 pg   MCHC 32.1  30.0 - 36.0 g/dL   RDW 17.0 (*) 11.5 - 15.5 %   Platelets  326  150 - 400 K/uL   Neutrophils Relative % 37 (*) 43 - 77 %   Neutro Abs 3.3  1.7 - 7.7 K/uL   Lymphocytes Relative 47 (*) 12 - 46 %   Lymphs Abs 4.2 (*) 0.7 - 4.0 K/uL   Monocytes Relative 8  3 - 12 %   Monocytes Absolute 0.7  0.1 - 1.0 K/uL   Eosinophils Relative 7 (*) 0 - 5 %   Eosinophils Absolute 0.6  0.0 - 0.7 K/uL   Basophils Relative 1  0 - 1 %   Basophils Absolute 0.1  0.0 - 0.1 K/uL  COMPREHENSIVE METABOLIC PANEL     Status: Abnormal   Collection Time    05/15/14 11:06 PM      Result Value Ref Range   Sodium 139  137 - 147 mEq/L   Potassium 4.3  3.7 - 5.3 mEq/L   Chloride 103  96 - 112 mEq/L   CO2 25  19 - 32 mEq/L   Glucose, Bld 96  70 - 99 mg/dL   BUN 18  6 - 23 mg/dL   Creatinine, Ser 0.94  0.50 - 1.10 mg/dL   Calcium 9.3  8.4 - 10.5 mg/dL   Total Protein 7.6  6.0 - 8.3 g/dL   Albumin 3.6  3.5 - 5.2 g/dL   AST 16  0 - 37 U/L   Comment: SLIGHT HEMOLYSIS     HEMOLYSIS AT THIS LEVEL MAY AFFECT RESULT   ALT 15  0 - 35 U/L   Alkaline Phosphatase 71  39 - 117  U/L   Total Bilirubin <0.2 (*) 0.3 - 1.2 mg/dL   GFR calc non Af Amer 71 (*) >90 mL/min   GFR calc Af Amer 82 (*) >90 mL/min   Comment: (NOTE)     The eGFR has been calculated using the CKD EPI equation.     This calculation has not been validated in all clinical situations.     eGFR's persistently <90 mL/min signify possible Chronic Kidney     Disease.   Anion gap 11  5 - 15  TROPONIN I     Status: None   Collection Time    05/15/14 11:06 PM      Result Value Ref Range   Troponin I <0.30  <0.30 ng/mL   Comment:            Due to the release kinetics of cTnI,     a negative result within the first hours     of the onset of symptoms does not rule out     myocardial infarction with certainty.     If myocardial infarction is still suspected,     repeat the test at appropriate intervals.  CBC     Status: Abnormal   Collection Time    05/16/14  4:45 AM      Result Value Ref Range   WBC 11.4 (*) 4.0 - 10.5 K/uL   RBC 4.42  3.87 - 5.11 MIL/uL   Hemoglobin 12.0  12.0 - 15.0 g/dL   HCT 37.9  36.0 - 46.0 %   MCV 85.7  78.0 - 100.0 fL   MCH 27.1  26.0 - 34.0 pg   MCHC 31.7  30.0 - 36.0 g/dL   RDW 17.0 (*) 11.5 - 15.5 %   Platelets 284  150 - 400 K/uL   Dg Chest Port 1 View  05/15/2014   CLINICAL DATA:  Shortness of breath.  EXAM:  PORTABLE CHEST - 1 VIEW  COMPARISON:  Chest radiograph performed 04/14/2014  FINDINGS: The lungs are well-aerated. Pulmonary vascularity is at the upper limits of normal. There is no evidence of focal opacification, pleural effusion or pneumothorax.  The cardiomediastinal silhouette is within normal limits. No acute osseous abnormalities are seen.  IMPRESSION: No acute cardiopulmonary process seen.   Electronically Signed   By: Garald Balding M.D.   On: 05/15/2014 23:50    Review of Systems  Constitutional: Positive for malaise/fatigue. Negative for fever, chills and diaphoresis.  HENT: Negative for congestion and sore throat.   Eyes: Negative for blurred  vision, double vision and redness.  Respiratory: Positive for cough, shortness of breath and wheezing. Negative for hemoptysis and sputum production.        Severe wheezing and shortness of breath that has been worsening over the last 4 days.   Cardiovascular: Positive for chest pain. Negative for palpitations, orthopnea, leg swelling and PND.       Chest pain is a tightness that is worse with deep respiration and cough. It is located in a band across her chest . It is 4/10 in severity.   Gastrointestinal: Negative for nausea, vomiting, abdominal pain, diarrhea, constipation, blood in stool and melena.  Genitourinary: Negative for dysuria, frequency and flank pain.  Musculoskeletal: Negative for back pain, joint pain and myalgias.  Skin: Negative for itching and rash.  Neurological: Positive for weakness. Negative for dizziness, speech change, seizures, loss of consciousness and headaches.  Endo/Heme/Allergies: Negative for environmental allergies. Does not bruise/bleed easily.  Psychiatric/Behavioral: Negative for depression and substance abuse. The patient is not nervous/anxious.     Blood pressure 102/71, pulse 122, temperature 97.6 F (36.4 C), temperature source Oral, resp. rate 20, height '5\' 6"'  (1.676 m), weight 123.832 kg (273 lb), last menstrual period 05/03/2014, SpO2 95.00%. Physical Exam  Constitutional: She is oriented to person, place, and time. She appears well-developed. She appears distressed.  Pt is morbidly obese woman who is tachypneic, using accessory muscles, and audibly wheezing as she sits in a tripod posiition.  HENT:  Head: Normocephalic and atraumatic.  Nose: Nose normal.  Mouth/Throat: Oropharynx is clear and moist. No oropharyngeal exudate.  Eyes: Conjunctivae and EOM are normal. Pupils are equal, round, and reactive to light. No scleral icterus.  Neck: Normal range of motion. No JVD present. No tracheal deviation present. No thyromegaly present.  Cardiovascular:  Normal rate, regular rhythm, normal heart sounds and intact distal pulses.  Exam reveals no gallop and no friction rub.   No murmur heard. Heart sounds are difficult to auscultate over the patient's wheezing.  Respiratory: No stridor. She is in respiratory distress. She has wheezes. She has no rales. She exhibits no tenderness.  Pt has increased work of breathing with tachypnea and conversational dyspnea.   GI: She exhibits no distension. There is no tenderness. There is no rebound and no guarding.  Musculoskeletal: She exhibits no edema and no tenderness.  Lymphadenopathy:    She has no cervical adenopathy.  Neurological: She is alert and oriented to person, place, and time. She has normal reflexes. She displays normal reflexes. No cranial nerve deficit. She exhibits normal muscle tone.  Skin: Skin is warm and dry. She is not diaphoretic. No erythema. No pallor.  Psychiatric: She has a normal mood and affect. Her behavior is normal. Judgment and thought content normal.     Assessment/Plan 1. Asthma exacerbation - IV steroids, IV antibiotics, nebulizer treatments 2. Chest pain - Pain  is most likely chest tightness associated with the patient's asthma exacerbation. However, the patient does have some important risk factors for cardiac chest pain including hypertension and family history.  She will be admitted to a telemetry bed and ruled out for MI by EKG and enzyme criteria. 3. Tachycardia - likely a result of the beta agonist use.  Will monitor. 4. Morbid Obesity. Complicates care of asthma.   Jakye Mullens 05/16/2014, 5:19 AM

## 2014-05-16 NOTE — Evaluation (Signed)
Speech Language Pathology Evaluation Patient Details Name: Kathleen AbideLouise Rodriguez MRN: [id number] DOB: 04-01-1966 Today's Date: 05/16/2014 Time: [id number] SLP Time Calculation (min): 45 min  Problem List:  Patient Active Problem List   Diagnosis Date Noted  . Pleuritic chest pain 05/16/2014  . Morbid obesity 05/16/2014  . Chronic cough 05/01/2014  . Upper airway cough syndrome 05/01/2014  . Vaginal candidiasis 04/09/2014  . GERD (gastroesophageal reflux disease) 04/08/2014  . Medically noncompliant 04/07/2014  . Asthma exacerbation 04/06/2014  . CAP (community acquired pneumonia) 04/06/2014  . Hypertension 09/24/2013  . OSA (obstructive sleep apnea) 03/20/2013  . Uncontrolled persistent asthma 02/05/2013   Past Medical History:  Past Medical History  Diagnosis Date  . Arthritis   . Asthma   . Hypertension   . Influenza B April 2014    Complicated by multi-organ failure  . 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 OSA (obstructive sleep apnea) 03/20/2013   Past Surgical History:  Past Surgical History  Procedure Laterality Date  . Tubal ligation    . Cesarean section     HPI:  48 year old female admitted 05/15/14 due to SOB, wheezing, chest pain and cough. CXR negative for acute cardiopulmonary disease. PMH significant for asthma and OSA. Evaluation ordered to assess voice and possible contribution to asthma symptoms.   Assessment / Plan / Recommendation Clinical Impression  SLP consulted to evaluate vocal function in setting of asthma exacerbation. Pt [place] a 48 [place] Pt reported that she began having asthma attacks in 2006, and was diagnosed with severe asthma in 2008. Since that time, her breathing has continued to worsen. Pt's voice quality appears normal at this time, and her s/z ratio is 1.01 (greater than 1.4 indicates possibility of vocal fold pathology).  However, pt does report several symptoms consistent with VCD (difficulty  breathing, coughing, throat tightness, voice changes).  Given shared symptoms of asthma, VCD, GERD, and LPR, it is recommended that pt have laryngoscopy, PFT, and/or formal allergy testing to determine primary cause of breathing difficulty and identify appropriate course of treatment. If VCD is determined to be a factor in pt's difficulties, outpatient speech therapy is highly recommended to assist with breathing and relaxation techniques, as well as education re: proper vocal fold use vs abuse.    SLP Assessment  All further Speech Lanaguage Pathology  needs can be addressed in the next venue of care    Follow Up Recommendations  Outpatient SLP (pending results of further testing)    Frequency and Duration        Pertinent Vitals/Pain VSS, no pain indicated   SLP Goals   n/a  SLP Evaluation Prior Functioning  Cognitive/Linguistic Baseline: Within functional limits Education: n/a   Cognition  Overall Cognitive Status: Within Functional Limits for tasks assessed    Comprehension  Auditory Comprehension Overall Auditory Comprehension: Appears within functional limits for tasks assessed    Expression Expression Primary Mode of Expression: Verbal Verbal Expression Overall Verbal Expression: Appears within functional limits for tasks assessed   Oral / Motor Oral Motor/Sensory Function Overall Oral Motor/Sensory Function: Appears within functional limits for tasks assessed Motor Speech Overall Motor Speech: Appears within functional limits for tasks assessed Respiration: Impaired (frequent SOB) Level of Impairment: Conversation Phonation: Normal (Occasional voice quality changes per pt) Resonance: Within functional limits Articulation: Within functional limitis Intelligibility: Intelligible Motor Planning: Witnin functional limits Motor Speech Errors: Not applicable   GO    Andrea Ferrer B. Kadan Millstein, MSP,  CCC-SLP [phone]  Kathleen Rodriguez 05/16/2014, 1:33 PM

## 2014-05-16 NOTE — ED Notes (Signed)
Pt asking for break before second continuous neb treatment. RN will wait 10 minutes before calling respiratory.

## 2014-05-16 NOTE — Progress Notes (Signed)
[facility] PROGRESS NOTE  Kathleen AbideLouise Rodriguez ZOX:[id number] DOB: 1966-09-08 DOA: 05/15/2014 PCP: Lorenda IshiharaVaradarajan, Rupashree  Assessment/Plan: 1-Asthma exacerbation; continue with albuterol, solumedrol, antibiotics. Will add protonix. Check D dimer.   2-Chest tightness; probably related to asthma exacerbation. Troponin times 2 negative. Check d dimer.   3-Tachycardia; probably related to nebulizer treatments.   4-metabolic acidosis; IV fluids.   5-Hyperglycemia; in setting steroids; SSI.  6-Vaginal  candidiasis; fluconazole.   Code Status: full code.  Family Communication: care discussed with patient.  Disposition Plan: remain inpatient.    Consultants:  none  Procedures:  none  Antibiotics:  Azithromycin.   HPI/Subjective: Feels better, still with chest tightness.  Still with SOB on exertion.   Objective: Filed Vitals:   05/16/14 0427  BP: 102/71  Pulse: 122  Temp: 97.6 F (36.4 C)  Resp: 20    Intake/Output Summary (Last 24 hours) at 05/16/14 0827 Last data filed at 05/16/14 0800  Gross per 24 hour  Intake    590 ml  Output      0 ml  Net    590 ml   Filed Weights   05/16/14 0300  Weight: 123.832 kg (273 lb)    Exam:   General:  No distress, speaking full sentences.   Cardiovascular: S 1, S 2 RRR  Respiratory: bilateral wheezing.   Abdomen: Bs present, NT  Musculoskeletal: no edema.    Data Reviewed: Basic Metabolic Panel:  Recent Labs Lab 05/15/14 2306 05/16/14 0445  NA 139 137  K 4.3 3.0*  CL 103 100  CO2 25 18*  GLUCOSE 96 265*  BUN 18 17  CREATININE 0.94 0.79  CALCIUM 9.3 8.7   Liver Function Tests:  Recent Labs Lab 05/15/14 2306 05/16/14 0445  AST 16 9  ALT 15 13  ALKPHOS 71 59  BILITOT <0.2* <0.2*  PROT 7.6 7.0  ALBUMIN 3.6 3.2*   No results found for this basename: LIPASE, AMYLASE,  in the last 168 hours No results found for this basename: AMMONIA,  in the last 168 hours CBC:  Recent Labs Lab  05/15/14 2306 05/16/14 0445  WBC 8.8 11.4*  NEUTROABS 3.3  --   HGB 13.5 12.0  HCT 42.1 37.9  MCV 86.1 85.7  PLT 326 284   Cardiac Enzymes:  Recent Labs Lab 05/15/14 2306 05/16/14 0445  TROPONINI <0.30 <0.30   BNP (last 3 results)  Recent Labs  03/02/14 1726 04/07/14 0325  PROBNP 32.5 103.4   CBG: No results found for this basename: GLUCAP,  in the last 168 hours  No results found for this or any previous visit (from the past 240 hour(s)).   Studies: Dg Chest Port 1 View  05/15/2014   CLINICAL DATA:  Shortness of breath.  EXAM: PORTABLE CHEST - 1 VIEW  COMPARISON:  Chest radiograph performed 04/14/2014  FINDINGS: The lungs are well-aerated. Pulmonary vascularity is at the upper limits of normal. There is no evidence of focal opacification, pleural effusion or pneumothorax.  The cardiomediastinal silhouette is within normal limits. No acute osseous abnormalities are seen.  IMPRESSION: No acute cardiopulmonary process seen.   Electronically Signed   By: Roanna RaiderJeffery  Chang M.D.   On: 05/15/2014 23:50    Scheduled Meds: . aspirin EC  81 mg Oral Daily  . azithromycin  500 mg Intravenous Q0600  . diltiazem  240 mg Oral Daily  . enoxaparin (LOVENOX) injection  40 mg Subcutaneous Q24H  . ferrous fumarate  1 tablet Oral QODAY  . fluticasone  2  spray Each Nare Daily  . ipratropium-albuterol  3 mL Nebulization Q6H  . methylPREDNISolone (SOLU-MEDROL) injection  60 mg Intravenous Q6H  . sodium chloride  3 mL Intravenous Q12H  . Vitamin D (Ergocalciferol)  50,000 Units Oral Q7 days   Continuous Infusions: . sodium chloride 75 mL/hr at 05/16/14 0356    Active Problems:   Hypertension   Asthma exacerbation   Pleuritic chest pain   Morbid obesity    Time spent: 25 minutes.     Hartley Barefoot A  [facility] Pager [phone]. If 7PM-7AM, please contact night-coverage at [email or web address], password [facility] 05/16/2014, 8:27 AM  LOS: 1 day

## 2014-05-16 NOTE — ED Notes (Signed)
Respiratory called and on the way.

## 2014-05-16 NOTE — Progress Notes (Signed)
Speech Language Pathology Treatment:  (Voice)  Patient Details Name: Kathleen Rodriguez MRN: [id number] DOB: [birth year] Today's Date: 05/16/2014 Time: [id number] SLP Time Calculation (min): 35 min  Assessment / Plan / Recommendation Clinical Impression  Results and recommendations of SLP evaluation were discussed at length and in depth with pt. Pt presents with symptoms that could be associated with asthma, LPR, VCD, or a combination of all three.  Pt was informed that her s/z ratio is 1.01, which does not indicate a strong likelihood of vocal fold pathology (1.4 would be more indicative). Pt was provided with written information re: vocal fold dysfunction (VCD), laryngopharyngeal reflux (LPR), and Paradoxical vocal fold motion (PVFM) vs asthma. Pt exhibits several symptoms consistent with all three of these issues, and requires additional testing to determine appropriate course of treatment going forward.  The following tests were discussed with pt: allergy testing, pulmonary function testing, and laryngoscopy, and pt was informed that MD was encouraged to proceed with further assessment to determine if speech therapy may be beneficial. If VCD is determined to be a contributing factor in pt's breathing issues, outpatient therapy was recommended to pt and MD to assist with breathing techniques and identifying possible vocally abusive behaviors. [facility]   HPI HPI: 48 year old female admitted 05/15/14 due to SOB, wheezing, chest pain and cough. CXR negative for acute cardiopulmonary disease. PMH significant for asthma and OSA. Evaluation ordered to assess voice and possible contribution to asthma symptoms.   Pertinent Vitals VSS  SLP Plan  Discharge SLP treatment due to (comment) (awaiting results of additional testing.)    Recommendations  Outpatient Speech Therapy if test results indicate appropriateness              General  recommendations:  (Referral to allergist, immunologist, pulmonologist, and/or ENT) Oral Care Recommendations: Oral care BID Follow up Recommendations: Outpatient SLP (pending workup results) Plan: Discharge SLP treatment due to (comment) (awaiting results of additional testing.)    GO   Ervine Witucki B. OppBueche, [facility] Center[facility]P, CCC-SLP [phone]  Leigh AuroraBueche, Nafisa [place] Brown 05/16/2014, 1:38 PM

## 2014-05-17 LAB — BASIC METABOLIC PANEL
Anion gap: 12 (ref 5–15)
BUN: 12 mg/dL (ref 6–23)
CALCIUM: 8.9 mg/dL (ref 8.4–10.5)
CHLORIDE: 105 meq/L (ref 96–112)
CO2: 21 meq/L (ref 19–32)
CREATININE: 0.72 mg/dL (ref 0.50–1.10)
GFR calc Af Amer: >90 mL/min (ref >90)
GFR calc non Af Amer: >90 mL/min (ref >90)
Glucose, Bld: 138 mg/dL — ABNORMAL HIGH (ref 70–99)
Potassium: 4.3 mEq/L (ref 3.7–5.3)
Sodium: 138 mEq/L (ref 137–147)

## 2014-05-17 LAB — GLUCOSE, CAPILLARY: GLUCOSE-CAPILLARY: 142 mg/dL — AB (ref 70–99)

## 2014-05-17 MED ORDER — PREDNISONE (PAK) 10 MG PO TABS: ORAL_TABLET | Freq: Every day | ORAL | Status: DC

## 2014-05-17 MED ORDER — PANTOPRAZOLE SODIUM 40 MG PO TBEC: 40.0000 mg | DELAYED_RELEASE_TABLET | Freq: Two times a day (BID) | ORAL | Status: DC

## 2014-05-17 MED ORDER — NYSTATIN 100000 UNIT/GM EX POWD: CUTANEOUS | Status: DC

## 2014-05-17 MED ORDER — ALBUTEROL SULFATE (2.5 MG/3ML) 0.083% IN NEBU: 2.5000 mg | INHALATION_SOLUTION | Freq: Four times a day (QID) | RESPIRATORY_TRACT | Status: DC | PRN

## 2014-05-17 MED ORDER — ALBUTEROL SULFATE HFA 108 (90 BASE) MCG/ACT IN AERS
2.0000 | INHALATION_SPRAY | Freq: Four times a day (QID) | RESPIRATORY_TRACT | Status: DC
Start: 2014-05-17 — End: 2014-05-17
  Filled 2014-05-17: qty 6.7

## 2014-05-17 MED ORDER — ALBUTEROL SULFATE HFA 108 (90 BASE) MCG/ACT IN AERS: 2.0000 | INHALATION_SPRAY | Freq: Four times a day (QID) | RESPIRATORY_TRACT | Status: DC | PRN

## 2014-05-17 MED ORDER — CVS BLOOD GLUCOSE METER W/DEVICE KIT: PACK | Status: DC

## 2014-05-17 MED ORDER — ALBUTEROL SULFATE (2.5 MG/3ML) 0.083% IN NEBU: 2.5000 mg | INHALATION_SOLUTION | Freq: Four times a day (QID) | RESPIRATORY_TRACT | Status: DC

## 2014-05-17 MED ORDER — FLUCONAZOLE 100 MG PO TABS: 100.0000 mg | ORAL_TABLET | Freq: Every day | ORAL | Status: DC

## 2014-05-17 MED ORDER — AZITHROMYCIN 500 MG PO TABS: 500.0000 mg | ORAL_TABLET | Freq: Every day | ORAL | Status: DC

## 2014-05-17 MED ORDER — NYSTATIN 100000 UNIT/GM EX POWD
Freq: Two times a day (BID) | CUTANEOUS | Status: DC
  Filled 2014-05-17: qty 15

## 2014-05-17 NOTE — Discharge Summary (Signed)
Physician Discharge Summary  Kathleen Rodriguez NID:[id number] DOB: 04-17-1966 DOA: 05/15/2014  PCP: Leeroy Cha  Admit date: 05/15/2014 Discharge date: 05/17/2014  Time spent: 35 minutes  Recommendations for Outpatient Follow-up:  1. Needs referral to ENT for evaluation of vocal cord dysfunction.  2. Follow up with Dr Halford Chessman for further evaluation of asthma.   Discharge Diagnoses:    Asthma exacerbation   Pleuritic chest pain   Morbid obesity   Hypertension  Discharge Condition: stable.   Diet recommendation: Carb modified while on prednisone.   Filed Weights   05/16/14 0300  Weight: 123.832 kg (273 lb)    History of present illness:  Pt is a 48 yr old woman who states that she has had increasing shortness of breath and wheezing which has been worsening over the past 4 days. She has had a cough which is non productive. The chest pain is more of a tightness located in a band across the center of the patient's chest. It is 3-4/10 in severity now that her breathing is a little better. It is not associated with nausea or diaphoresis.    Hospital Course:  1-Asthma exacerbation; Patient was treated with albuterol, solumedrol, antibiotics. She was also started on protonix.  D dimer was negative. Speech evaluation recommend referral to ENT for evaluation of vocal cord dysfunction. Patient dyspnea has improved. She will be discharge on prednisone, albuterol and protonix.   2-Chest tightness; probably related to asthma exacerbation. Troponin times 2 negative. d dimer negative. Resolved.  3-Tachycardia; probably related to nebulizer treatments. Resolved. D dimer negative 4-metabolic acidosis;Resolved with IV fluids.  5-Hyperglycemia; in setting steroids; SSI.  6-Vaginal candidiasis; fluconazole.    Procedures:  none  Consultations:  none  Discharge Exam: Filed Vitals:   05/17/14 0500  BP: 132/59  Pulse: 89  Temp: 98.2 F (36.8 C)  Resp: 18    General: no distress,  speaking in full sentences.  Cardiovascular: S 1, S 2 RRR Respiratory: CTA  Discharge Instructions You were cared for by a hospitalist during your hospital stay. If you have any questions about your discharge medications or the care you received while you were in the hospital after you are discharged, you can call the unit and asked to speak with the hospitalist on call if the hospitalist that took care of you is not available. Once you are discharged, your primary care physician will handle any further medical issues. Please note that NO REFILLS for any discharge medications will be authorized once you are discharged, as it is imperative that you return to your primary care physician (or establish a relationship with a primary care physician if you do not have one) for your aftercare needs so that they can reassess your need for medications and monitor your lab values.  Discharge Instructions   Diet Carb Modified    Complete by:  As directed      Increase activity slowly    Complete by:  As directed             Medication List    STOP taking these medications       predniSONE 10 MG tablet  Commonly known as:  DELTASONE  Replaced by:  predniSONE 10 MG tablet      TAKE these medications       AEROCHAMBER MV inhaler  Use as instructed     albuterol (2.5 MG/3ML) 0.083% nebulizer solution  Commonly known as:  PROVENTIL  Take 3 mLs (2.5 mg total) by nebulization every 6 (  six) hours as needed for wheezing. DX 493.90     albuterol 108 (90 BASE) MCG/ACT inhaler  Commonly known as:  VENTOLIN HFA  Inhale 2 puffs into the lungs every 6 (six) hours as needed for wheezing.     azithromycin 500 MG tablet  Commonly known as:  ZITHROMAX  Take 1 tablet (500 mg total) by mouth daily.     [employer] BLOOD GLUCOSE METER W/DEVICE Kit  Check blood sugar BID.     diltiazem 240 MG 24 hr capsule  Commonly known as:  CARDIZEM CD  Take 1 capsule (240 mg total) by mouth daily.     ferrous fumarate 325  (106 FE) MG Tabs tablet  Commonly known as:  HEMOCYTE - 106 mg FE  Take 1 tablet by mouth every other day.     fluconazole 100 MG tablet  Commonly known as:  DIFLUCAN  Take 1 tablet (100 mg total) by mouth daily.     fluticasone 50 MCG/ACT nasal spray  Commonly known as:  FLONASE  Place 2 sprays into the nose daily.     nystatin 100000 UNIT/GM Powd  Apply BID skin fold breast area,..     pantoprazole 40 MG tablet  Commonly known as:  PROTONIX  Take 1 tablet (40 mg total) by mouth 2 (two) times daily.     predniSONE 10 MG tablet  Commonly known as:  STERAPRED UNI-PAK  Take by mouth daily. Take 6 tablets for 3 days then 4 tablet for 3 days the 2 tablets for 2 days then stop.     Vitamin D (Ergocalciferol) 50000 UNITS Caps capsule  Commonly known as:  DRISDOL  Take 50,000 Units by mouth every 7 (seven) days. On thursdays.       Allergies  Allergen Reactions  . Other Hives and Itching    Ketchup  . Tomato Hives and Itching       Follow-up Information   Follow up with Varadarajan, Rupashree In 1 week.   Specialty:  Internal Medicine   Contact information:   [address] [place] [address] [phone]       Follow up with SOOD,VINEET, MD In 1 week.   Specialty:  Pulmonary Disease   Contact information:   [address] [address] [phone]        The results of significant diagnostics from this hospitalization (including imaging, microbiology, ancillary and laboratory) are listed below for reference.    Significant Diagnostic Studies: Dg Chest Port 1 View  05/15/2014   CLINICAL DATA:  Shortness of breath.  EXAM: PORTABLE CHEST - 1 VIEW  COMPARISON:  Chest radiograph performed 04/14/2014  FINDINGS: The lungs are well-aerated. Pulmonary vascularity is at the upper limits of normal. There is no evidence of focal opacification, pleural effusion or pneumothorax.  The cardiomediastinal silhouette is within normal limits. No acute osseous  abnormalities are seen.  IMPRESSION: No acute cardiopulmonary process seen.   Electronically Signed   By: Garald Balding M.D.   On: 05/15/2014 23:50    Microbiology: No results found for this or any previous visit (from the past 240 hour(s)).   Labs: Basic Metabolic Panel:  Recent Labs Lab 05/15/14 2306 05/16/14 0445 05/17/14 0745  NA 139 137 138  K 4.3 3.0* 4.3  CL 103 100 105  CO2 25 18* 21  GLUCOSE 96 265* 138*  BUN _0 CREATININE 0.94 0.79 0.72  CALCIUM 9.3 8.7 8.9   Liver Function Tests:  Recent  Labs Lab 05/15/14 2306 05/16/14 0445  AST 16 9  ALT 15 13  ALKPHOS 71 59  BILITOT <0.2* <0.2*  PROT 7.6 7.0  ALBUMIN 3.6 3.2*   No results found for this basename: LIPASE, AMYLASE,  in the last 168 hours No results found for this basename: AMMONIA,  in the last 168 hours CBC:  Recent Labs Lab 05/15/14 2306 05/16/14 0445  WBC 8.8 11.4*  NEUTROABS 3.3  --   HGB 13.5 12.0  HCT 42.1 37.9  MCV 86.1 85.7  PLT 326 284   Cardiac Enzymes:  Recent Labs Lab 05/15/14 2306 05/16/14 0445 05/16/14 1011 05/16/14 1532  TROPONINI <0.30 <0.30 <0.30 <0.30   BNP: BNP (last 3 results)  Recent Labs  03/02/14 1726 04/07/14 0325  PROBNP 32.5 103.4   CBG:  Recent Labs Lab 05/16/14 1202 05/16/14 1634 05/16/14 2047 05/17/14 0721  GLUCAP 116* 165* 145* 142*       Signed:  Xian Apostol A  [facility] 05/17/2014, 9:35 AM

## 2014-05-22 ENCOUNTER — Encounter: Payer: Self-pay | Admitting: Adult Health

## 2014-05-22 ENCOUNTER — Ambulatory Visit (INDEPENDENT_AMBULATORY_CARE_PROVIDER_SITE_OTHER): Payer: Managed Care, Other (non HMO) | Admitting: Adult Health

## 2014-05-22 VITALS — BP 128/70 | HR 108 | Temp 97.4°F | Ht 66.0 in | Wt 273.2 lb

## 2014-05-22 DIAGNOSIS — J383 Other diseases of vocal cords: Secondary | ICD-10-CM

## 2014-05-22 DIAGNOSIS — G4733 Obstructive sleep apnea (adult) (pediatric): Secondary | ICD-10-CM

## 2014-05-22 DIAGNOSIS — J45901 Unspecified asthma with (acute) exacerbation: Secondary | ICD-10-CM

## 2014-05-22 MED ORDER — OMEPRAZOLE 20 MG PO CPDR: 20.0000 mg | DELAYED_RELEASE_CAPSULE | Freq: Every day | ORAL | Status: DC

## 2014-05-22 NOTE — Patient Instructions (Addendum)
Finish prednisone taper as directed. May use omeprazole 20 mg daily before meal ( in place of protonix) Begin Pepcid 20 mg at bedtime Begin Zyrtec 10 mg in a.m. May use chlorpheniramine 4 mg 2 at bedtime. Reflux diet Avoid all Mint  products Delsym 2 teaspoons twice daily as needed. For cough We are referring you to ENT for evaluation of chronic hoarseness, and possible vocal cord dysfunction Continue on CPAP  Followup with Dr. Craige CottaSood in 6 weeks and As needed

## 2014-05-22 NOTE — Assessment & Plan Note (Signed)
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  Suspect post nasal drainage, GERD as triggers for cough and wheezing  Will try to control triggers and refer to ENT for evaluation   Plan  Finish prednisone taper as directed. May use omeprazole 20 mg daily before meal ( in place of protonix) Begin Pepcid 20 mg at bedtime Begin Zyrtec 10 mg in a.m. May use chlorpheniramine 4 mg 2 at bedtime. Reflux diet Avoid all Mint  products Delsym 2 teaspoons twice daily as needed. For cough We are referring you to ENT for evaluation of chronic hoarseness, and possible vocal cord dysfunction Continue on CPAP  Followup with Dr. Craige CottaSood in 6 weeks and As needed

## 2014-05-22 NOTE — Progress Notes (Signed)
Reviewed and agree with assessment/plan. 

## 2014-05-22 NOTE — Progress Notes (Signed)
   Subjective:    Patient ID: Kathleen Rodriguez, female    DOB: 1966/09/01, 48 y.o.   MRN: [id number]  HPI 48 yo former smoker with chronic cough, upper airway cough syndrome, possible asthma, OSA, and admission for influenza pneumonia in April 2014.  05/22/2014 [facility] follow up  Patient presents for a post hospital followup. Admitted last week for an asthma exacerbation, pleuritic chest pain complicated by possible vocal cord dysfunction. Cardiac enzymes and d-dimer were negative. Patient was treated with IV antibiotics, and steroids, and nebulized bronchodilators. Patient was seen by speech therapy and was recommended to be referred to ENT for evaluation of possible vocal cord dysfunction. Felt d/t voice changes, hoarseness, throat clearing.  Was started on Protonix but can not afford. Not taking anything for drainage.  Chest x-ray shows no acute process. Patient was discharged on pred taper .  Since discharge. Does not feel significantly better. Continues to have intermittent shortness, of breath with activity, hoarseness, drainage.  Patient denies any hemoptysis, orthopnea, PND, or leg swelling. Says she is doing well on CPAP,wearing everynight, does feel better and not snoring .    TESTS: 02/05/13 CT chest >> no PE, no acute findings 02/11/13 Labs >> ANA negative, Anti-GBM Ab < 1, ANCA negative PSG 04/19/13 >> AHI 16.4, SpO2 low 84%. PFT 05/15/13 >> FEV1 1.90 (76%), FEV1% 72, TLC 4.23 (81%), DLCO 89%, no BD Echo 04/06/14 >> mod LVH, EF 65 to 70%    Review of Systems Constitutional:   No  weight loss, night sweats,  Fevers, chills,  +fatigue, or  lassitude.  HEENT:   No headaches,  Difficulty swallowing,  Tooth/dental problems, or  Sore throat,                No sneezing, itching, ear ache, ++ nasal congestion, post nasal drip,   CV:  No chest pain,  Orthopnea, PND, swelling in lower extremities, anasarca, dizziness, palpitations, syncope.   GI  No heartburn, indigestion,  abdominal pain, nausea, vomiting, diarrhea, change in bowel habits, loss of appetite, bloody stools.   Resp:  No coughing up of blood.  No change in color of mucus.  No wheezing.  No chest wall deformity  Skin: no rash or lesions.  GU: no dysuria, change in color of urine, no urgency or frequency.  No flank pain, no hematuria   MS:  No joint pain or swelling.  No decreased range of motion.  +back pain.  Psych:  No change in mood or affect. No depression or anxiety.  No memory loss.         Objective:   Physical Exam GEN: A/Ox3; pleasant , NAD, obese   HEENT:  [place]/AT,  EACs-clear, TMs-wnl, NOSE-clear drainage  THROAT-clear, no lesions, no postnasal drip or exudate noted.   NECK:  Supple w/ fair ROM; no JVD; normal carotid impulses w/o bruits; no thyromegaly or nodules palpated; no lymphadenopathy.  RESP  Clear  P & A; w/o, wheezes/ rales/ or rhonchi.no accessory muscle use, no dullness to percussion  CARD:  RRR, no m/r/g  , no peripheral edema, pulses intact, no cyanosis or clubbing.  GI:   Soft & nt; nml bowel sounds; no organomegaly or masses detected.  Musco: Warm bil, no deformities or joint swelling noted.   Neuro: alert, no focal deficits noted.    Skin: Warm, no lesions or rashes         Assessment & Plan:

## 2014-05-22 NOTE — Assessment & Plan Note (Signed)
Cont on CPAP  Work on weight loss

## 2014-06-03 ENCOUNTER — Encounter (HOSPITAL_BASED_OUTPATIENT_CLINIC_OR_DEPARTMENT_OTHER): Payer: Self-pay | Admitting: Emergency Medicine

## 2014-06-03 ENCOUNTER — Emergency Department (HOSPITAL_BASED_OUTPATIENT_CLINIC_OR_DEPARTMENT_OTHER)
Admission: EM | Admit: 2014-06-03 | Discharge: 2014-06-03 | Disposition: A | Discharge status: Home / Self Care | Payer: Managed Care, Other (non HMO) | Attending: Emergency Medicine | Admitting: Emergency Medicine

## 2014-06-03 DIAGNOSIS — I10 Essential (primary) hypertension: Secondary | ICD-10-CM | POA: Insufficient documentation

## 2014-06-03 DIAGNOSIS — Z79899 Other long term (current) drug therapy: Secondary | ICD-10-CM | POA: Diagnosis not present

## 2014-06-03 DIAGNOSIS — K089 Disorder of teeth and supporting structures, unspecified: Secondary | ICD-10-CM | POA: Insufficient documentation

## 2014-06-03 DIAGNOSIS — IMO0002 Reserved for concepts with insufficient information to code with codable children: Secondary | ICD-10-CM | POA: Insufficient documentation

## 2014-06-03 DIAGNOSIS — Z8739 Personal history of other diseases of the musculoskeletal system and connective tissue: Secondary | ICD-10-CM | POA: Diagnosis not present

## 2014-06-03 DIAGNOSIS — Z87891 Personal history of nicotine dependence: Secondary | ICD-10-CM | POA: Diagnosis not present

## 2014-06-03 DIAGNOSIS — Z8669 Personal history of other diseases of the nervous system and sense organs: Secondary | ICD-10-CM | POA: Diagnosis not present

## 2014-06-03 DIAGNOSIS — K0889 Other specified disorders of teeth and supporting structures: Secondary | ICD-10-CM

## 2014-06-03 DIAGNOSIS — Z8619 Personal history of other infectious and parasitic diseases: Secondary | ICD-10-CM | POA: Diagnosis not present

## 2014-06-03 DIAGNOSIS — J45909 Unspecified asthma, uncomplicated: Secondary | ICD-10-CM | POA: Diagnosis not present

## 2014-06-03 MED ORDER — IBUPROFEN 400 MG PO TABS
600.0000 mg | ORAL_TABLET | Freq: Once | ORAL | Status: AC
  Administered 2014-06-03: 600 mg via ORAL
  Filled 2014-06-03 (×2): qty 1

## 2014-06-03 MED ORDER — IBUPROFEN 600 MG PO TABS: 600.0000 mg | ORAL_TABLET | Freq: Four times a day (QID) | ORAL | Status: DC | PRN

## 2014-06-03 MED ORDER — HYDROCODONE-ACETAMINOPHEN 5-325 MG PO TABS
1.0000 | ORAL_TABLET | Freq: Once | ORAL | Status: AC
  Administered 2014-06-03: 1 via ORAL
  Filled 2014-06-03: qty 1

## 2014-06-03 MED ORDER — PENICILLIN V POTASSIUM 250 MG PO TABS: 250.0000 mg | ORAL_TABLET | Freq: Four times a day (QID) | ORAL | Status: AC

## 2014-06-03 MED ORDER — HYDROCODONE-ACETAMINOPHEN 5-325 MG PO TABS: 1.0000 | ORAL_TABLET | ORAL | Status: DC | PRN

## 2014-06-03 NOTE — ED Notes (Signed)
Pt reports ongoing dental pain in L upper back molar.

## 2014-06-03 NOTE — ED Provider Notes (Signed)
CSN: [id number]     Arrival date & time 06/03/14  9794 History   First MD Initiated Contact with Patient 06/03/14 0326     Chief Complaint  Patient presents with  . Dental Pain     (Consider location/radiation/quality/duration/timing/severity/associated sxs/prior Treatment) HPI Patient presents with several days of left upper molar pain. She states she's had mild swelling of the left face. She denies any fevers or chills. Past Medical History  Diagnosis Date  . Arthritis   . Asthma   . Hypertension   . Influenza B April 8016    Complicated by multi-organ failure  . 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 OSA (obstructive sleep apnea) 03/20/2013   Past Surgical History  Procedure Laterality Date  . Tubal ligation    . Cesarean section     Family History  Problem Relation Age of Onset  . Hypertension Mother   . HIV/AIDS Father    History  Substance Use Topics  . Smoking status: Former Smoker -- 0.25 packs/day for 20 years    Types: Cigarettes    Quit date: 01/31/2013  . Smokeless tobacco: Never Used  . Alcohol Use: No   OB History   Grav Para Term Preterm Abortions TAB SAB Ect Mult Living                 Review of Systems  Constitutional: Negative for fever and chills.  HENT: Positive for dental problem and facial swelling.   Gastrointestinal: Negative for nausea and vomiting.  Skin: Negative for rash.  All other systems reviewed and are negative.     Allergies  Other and Tomato  Home Medications   Prior to Admission medications   Medication Sig Start Date End Date Taking? Authorizing Provider  albuterol (PROVENTIL) (2.5 MG/3ML) 0.083% nebulizer solution Take 3 mLs (2.5 mg total) by nebulization every 6 (six) hours as needed for wheezing. DX 493.90 05/17/14   Belkys A Regalado, MD  albuterol (VENTOLIN HFA) 108 (90 BASE) MCG/ACT inhaler Inhale 2 puffs into the lungs every 6 (six) hours as needed for wheezing. 05/17/14   Belkys A Regalado, MD  Blood Glucose  Monitoring Suppl ([employer] BLOOD GLUCOSE METER) W/DEVICE KIT Check blood sugar BID. 05/17/14   Belkys A Regalado, MD  diltiazem (CARDIZEM CD) 240 MG 24 hr capsule Take 1 capsule (240 mg total) by mouth daily. 02/21/13   Marijean Heath, NP  ferrous fumarate (HEMOCYTE - 106 MG FE) 325 (106 FE) MG TABS Take 1 tablet by mouth every other day.     Historical Provider, MD  fluticasone (FLONASE) 50 MCG/ACT nasal spray Place 2 sprays into the nose daily. 02/21/13   Marijean Heath, NP  HYDROcodone-acetaminophen (NORCO) 5-325 MG per tablet Take 1 tablet by mouth every 4 (four) hours as needed. 06/03/14   Julianne Rice, MD  ibuprofen (ADVIL,MOTRIN) 600 MG tablet Take 1 tablet (600 mg total) by mouth every 6 (six) hours as needed. 06/03/14   Julianne Rice, MD  nystatin (MYCOSTATIN/NYSTOP) 100000 UNIT/GM POWD Apply BID skin fold breast area,.. 05/17/14   Belkys A Regalado, MD  omeprazole (PRILOSEC) 20 MG capsule Take 1 capsule (20 mg total) by mouth daily. 05/22/14   Tammy S Parrett, NP  penicillin v potassium (VEETID) 250 MG tablet Take 1 tablet (250 mg total) by mouth 4 (four) times daily. 06/03/14 06/10/14  Julianne Rice, MD  predniSONE (STERAPRED UNI-PAK) 10 MG tablet Take by mouth daily. Take 6 tablets for 3 days then 4 tablet for 3  days the 2 tablets for 2 days then stop. 05/17/14   Elmarie Shiley, MD  Spacer/Aero-Holding Chambers (AEROCHAMBER MV) inhaler Use as instructed 04/14/14   Tammy S Parrett, NP  Vitamin D, Ergocalciferol, (DRISDOL) 50000 UNITS CAPS Take 50,000 Units by mouth every 7 (seven) days. On thursdays.    Historical Provider, MD   BP 155/90  Pulse 84  Temp(Src) 98.2 F (36.8 C) (Oral)  Resp 21  Ht '5\' 6"'  (1.676 m)  Wt 273 lb (123.832 kg)  BMI 44.08 kg/m2  SpO2 98%  LMP 05/03/2014 Physical Exam  Nursing note and vitals reviewed. Constitutional: She is oriented to person, place, and time. She appears well-developed and well-nourished. No distress.  HENT:  Head: Normocephalic and  atraumatic.  Mouth/Throat: Oropharynx is clear and moist. No oropharyngeal exudate.  Patient with left upper second molar tenderness to percussion. Obvious tooth decay. Mild erythema around the gingival base. There are no fluctuant masses.  Eyes: Pupils are equal, round, and reactive to light.  Neck: Normal range of motion. Neck supple.  Cardiovascular: Normal rate and regular rhythm.   Pulmonary/Chest: Effort normal.  Abdominal: Soft. Bowel sounds are normal.  Musculoskeletal: Normal range of motion. She exhibits no edema and no tenderness.  Neurological: She is alert and oriented to person, place, and time.  Skin: Skin is warm and dry. No rash noted. No erythema.  Psychiatric: She has a normal mood and affect. Her behavior is normal.    ED Course  Procedures (including critical care time) Labs Review Labs Reviewed - No data to display  Imaging Review No results found.   EKG Interpretation None      MDM   Final diagnoses:  Pain, dental    Advised to followup with a dentist. Return precautions given.    Julianne Rice, MD 06/03/14 [phone]

## 2014-06-03 NOTE — Discharge Instructions (Signed)

## 2014-07-01 ENCOUNTER — Encounter (HOSPITAL_COMMUNITY): Payer: Self-pay | Admitting: Emergency Medicine

## 2014-07-01 ENCOUNTER — Emergency Department (HOSPITAL_COMMUNITY)
Admission: EM | Admit: 2014-07-01 | Discharge: 2014-07-02 | Disposition: A | Discharge status: Home / Self Care | Payer: Managed Care, Other (non HMO) | Attending: Emergency Medicine | Admitting: Emergency Medicine

## 2014-07-01 ENCOUNTER — Emergency Department (HOSPITAL_COMMUNITY): Payer: Managed Care, Other (non HMO)

## 2014-07-01 DIAGNOSIS — R079 Chest pain, unspecified: Secondary | ICD-10-CM | POA: Insufficient documentation

## 2014-07-01 DIAGNOSIS — IMO0002 Reserved for concepts with insufficient information to code with codable children: Secondary | ICD-10-CM | POA: Diagnosis not present

## 2014-07-01 DIAGNOSIS — Z87891 Personal history of nicotine dependence: Secondary | ICD-10-CM | POA: Diagnosis not present

## 2014-07-01 DIAGNOSIS — R609 Edema, unspecified: Secondary | ICD-10-CM | POA: Insufficient documentation

## 2014-07-01 DIAGNOSIS — Z79899 Other long term (current) drug therapy: Secondary | ICD-10-CM | POA: Insufficient documentation

## 2014-07-01 DIAGNOSIS — Z8669 Personal history of other diseases of the nervous system and sense organs: Secondary | ICD-10-CM | POA: Insufficient documentation

## 2014-07-01 DIAGNOSIS — Z8739 Personal history of other diseases of the musculoskeletal system and connective tissue: Secondary | ICD-10-CM | POA: Diagnosis not present

## 2014-07-01 DIAGNOSIS — J45901 Unspecified asthma with (acute) exacerbation: Secondary | ICD-10-CM | POA: Insufficient documentation

## 2014-07-01 DIAGNOSIS — R6883 Chills (without fever): Secondary | ICD-10-CM | POA: Diagnosis not present

## 2014-07-01 DIAGNOSIS — I10 Essential (primary) hypertension: Secondary | ICD-10-CM | POA: Insufficient documentation

## 2014-07-01 DIAGNOSIS — H1130 Conjunctival hemorrhage, unspecified eye: Secondary | ICD-10-CM | POA: Insufficient documentation

## 2014-07-01 DIAGNOSIS — R0602 Shortness of breath: Secondary | ICD-10-CM | POA: Insufficient documentation

## 2014-07-01 DIAGNOSIS — H1132 Conjunctival hemorrhage, left eye: Secondary | ICD-10-CM

## 2014-07-01 LAB — COMPREHENSIVE METABOLIC PANEL
ALT: 16 U/L (ref 0–35)
AST: 33 U/L (ref 0–37)
Albumin: 3.1 g/dL — ABNORMAL LOW (ref 3.5–5.2)
Alkaline Phosphatase: 54 U/L (ref 39–117)
Anion gap: 13 (ref 5–15)
BUN: 15 mg/dL (ref 6–23)
CALCIUM: 8.8 mg/dL (ref 8.4–10.5)
CHLORIDE: 108 meq/L (ref 96–112)
CO2: 19 mEq/L (ref 19–32)
CREATININE: 0.7 mg/dL (ref 0.50–1.10)
GFR calc Af Amer: >90 mL/min (ref >90)
GFR calc non Af Amer: >90 mL/min (ref >90)
GLUCOSE: 166 mg/dL — AB (ref 70–99)
Potassium: 5.1 mEq/L (ref 3.7–5.3)
Sodium: 140 mEq/L (ref 137–147)
Total Protein: 7.2 g/dL (ref 6.0–8.3)

## 2014-07-01 LAB — PRO B NATRIURETIC PEPTIDE: Pro B Natriuretic peptide (BNP): 97.2 pg/mL (ref 0–125)

## 2014-07-01 LAB — CBC
HCT: 38.9 % (ref 36.0–46.0)
HEMOGLOBIN: 12.8 g/dL (ref 12.0–15.0)
MCH: 27.8 pg (ref 26.0–34.0)
MCHC: 32.9 g/dL (ref 30.0–36.0)
MCV: 84.6 fL (ref 78.0–100.0)
PLATELETS: 155 10*3/uL (ref 150–400)
RBC: 4.6 MIL/uL (ref 3.87–5.11)
RDW: 16.5 % — ABNORMAL HIGH (ref 11.5–15.5)
WBC: 7.3 10*3/uL (ref 4.0–10.5)

## 2014-07-01 LAB — TROPONIN I

## 2014-07-01 MED ORDER — FLUCONAZOLE 150 MG PO TABS: 150.0000 mg | ORAL_TABLET | Freq: Every day | ORAL | Status: AC

## 2014-07-01 MED ORDER — IPRATROPIUM-ALBUTEROL 0.5-2.5 (3) MG/3ML IN SOLN
3.0000 mL | Freq: Once | RESPIRATORY_TRACT | Status: AC
  Administered 2014-07-01: 3 mL via RESPIRATORY_TRACT
  Filled 2014-07-01: qty 3

## 2014-07-01 MED ORDER — SULFACETAMIDE SODIUM 10 % OP SOLN: 1.0000 [drp] | OPHTHALMIC | Status: DC

## 2014-07-01 MED ORDER — ALBUTEROL SULFATE (2.5 MG/3ML) 0.083% IN NEBU
5.0000 mg | INHALATION_SOLUTION | Freq: Once | RESPIRATORY_TRACT | Status: AC
  Administered 2014-07-01: 5 mg via RESPIRATORY_TRACT
  Filled 2014-07-01: qty 6

## 2014-07-01 MED ORDER — PREDNISONE 20 MG PO TABS
30.0000 mg | ORAL_TABLET | Freq: Once | ORAL | Status: AC
Start: 2014-07-01 — End: 2014-07-01
  Administered 2014-07-01: 30 mg via ORAL
  Filled 2014-07-01: qty 2

## 2014-07-01 MED ORDER — SODIUM CHLORIDE 0.9 % IV BOLUS (SEPSIS): 1000.0000 mL | Freq: Once | INTRAVENOUS | Status: DC

## 2014-07-01 MED ORDER — PREDNISONE 20 MG PO TABS: 60.0000 mg | ORAL_TABLET | Freq: Every day | ORAL | Status: DC

## 2014-07-01 NOTE — ED Notes (Signed)
Patient transported to X-ray 

## 2014-07-01 NOTE — Discharge Instructions (Signed)

## 2014-07-01 NOTE — ED Notes (Signed)
Pt refusing IV/fluids and prednisone, reports already took prednisone today.  Pt ambulatory with steady gait to void in BR.

## 2014-07-01 NOTE — ED Provider Notes (Signed)
CSN: [id number]     Arrival date & time 07/01/14  1657 History   First MD Initiated Contact with Patient 07/01/14 1751     Chief Complaint  Patient presents with  . Shortness of Breath     (Consider location/radiation/quality/duration/timing/severity/associated sxs/prior Treatment) Patient is a 48 y.o. female presenting with shortness of breath. The history is provided by the patient.  Shortness of Breath Severity:  Moderate Associated symptoms: chest pain and wheezing   Associated symptoms: no abdominal pain, no headaches, no rash and no vomiting    patient's had shortness of breath over the last 4 days. She's had a cough with minimal whitish sputum. History of severe asthma. Has required intubation the past. He has had "fluid on her heart" in the past. States her kids have had a virus. She states she's had steroids at home. Continues to be short of breath. She has some swelling in her legs also. She's had chills without frank fever.  Past Medical History  Diagnosis Date  . Arthritis   . Asthma   . Hypertension   . Influenza B April 2014    Complicated by multi-organ failure  . 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 OSA (obstructive sleep apnea) 03/20/2013   Past Surgical History  Procedure Laterality Date  . Tubal ligation    . Cesarean section     Family History  Problem Relation Age of Onset  . Hypertension Mother   . HIV/AIDS Father    History  Substance Use Topics  . Smoking status: Former Smoker -- 0.25 packs/day for 20 years    Types: Cigarettes    Quit date: 01/31/2013  . Smokeless tobacco: Never Used  . Alcohol Use: No   OB History   Grav Para Term Preterm Abortions TAB SAB Ect Mult Living                 Review of Systems  Constitutional: Positive for chills and fatigue. Negative for activity change and appetite change.  Eyes: Negative for pain.  Respiratory: Positive for shortness of breath and wheezing. Negative for chest tightness.   Cardiovascular:  Positive for chest pain and leg swelling.  Gastrointestinal: Negative for nausea, vomiting, abdominal pain and diarrhea.  Genitourinary: Negative for flank pain.  Musculoskeletal: Negative for back pain and neck stiffness.  Skin: Negative for rash.  Neurological: Negative for weakness, numbness and headaches.  Hematological: Does not bruise/bleed easily.  Psychiatric/Behavioral: Negative for behavioral problems.      Allergies  Other and Tomato  Home Medications   Prior to Admission medications   Medication Sig Start Date End Date Taking? Authorizing Provider  albuterol (PROVENTIL) (2.5 MG/3ML) 0.083% nebulizer solution Take 3 mLs (2.5 mg total) by nebulization every 6 (six) hours as needed for wheezing. DX 493.90 05/17/14  Yes Belkys A Regalado, MD  albuterol (VENTOLIN HFA) 108 (90 BASE) MCG/ACT inhaler Inhale 2 puffs into the lungs every 6 (six) hours as needed for wheezing. 05/17/14  Yes Belkys A Regalado, MD  ascorbic acid (VITAMIN C) 500 MG/5ML syrup Take 1,000 mg by mouth daily.   Yes Historical Provider, MD  diltiazem (CARDIZEM CD) 240 MG 24 hr capsule Take 1 capsule (240 mg total) by mouth daily. 02/21/13  Yes Bernadene Person, NP  ferrous fumarate (HEMOCYTE - 106 MG FE) 325 (106 FE) MG TABS Take 1 tablet by mouth every other day.    Yes Historical Provider, MD  fluticasone (FLONASE) 50 MCG/ACT nasal spray Place 2 sprays into the  nose daily. 02/21/13  Yes Bernadene Person, NP  OVER THE COUNTER MEDICATION Day quill and nyquil and therflu   Yes Historical Provider, MD  predniSONE (STERAPRED UNI-PAK) 10 MG tablet Take by mouth daily. Take 6 tablets for 3 days then 4 tablet for 3 days the 2 tablets for 2 days then stop. 05/17/14  Yes Belkys A Regalado, MD  Vitamin D, Ergocalciferol, (DRISDOL) 50000 UNITS CAPS Take 50,000 Units by mouth every 7 (seven) days. On thursdays.   Yes Historical Provider, MD  fluconazole (DIFLUCAN) 100 MG tablet Take 100 mg by mouth daily.  06/20/14    Historical Provider, MD  fluconazole (DIFLUCAN) 150 MG tablet Take 1 tablet (150 mg total) by mouth daily. 07/01/14 07/08/14  Juliet Rude. Mercedes Valeriano, MD  HYDROcodone-acetaminophen (NORCO) 5-325 MG per tablet Take 1 tablet by mouth every 4 (four) hours as needed. 06/03/14   Loren Racer, MD  predniSONE (DELTASONE) 20 MG tablet Take 3 tablets (60 mg total) by mouth daily. 07/01/14   Juliet Rude. Jordyn Hofacker, MD  sulfacetamide (BLEPH-10) 10 % ophthalmic solution Place 1 drop into the left eye every 4 (four) hours. 07/01/14   Juliet Rude. Alyce Inscore, MD   BP 143/73  Pulse 87  Temp(Src) 98.2 F (36.8 C) (Oral)  Resp 20  SpO2 96%  LMP 06/11/2014 Physical Exam  Nursing note and vitals reviewed. Constitutional: She is oriented to person, place, and time. She appears well-developed and well-nourished.  HENT:  Head: Normocephalic and atraumatic.  Eyes: EOM are normal. Pupils are equal, round, and reactive to light.   Possible subconjunctival hemorrhage versus conjunctivitis of left eye.  Neck: Normal range of motion. Neck supple.  Cardiovascular: Normal rate, regular rhythm and normal heart sounds.   No murmur heard. Pulmonary/Chest: No respiratory distress. She has wheezes. She has no rales.  Mild tachypnea. Diffuse wheezes with prolonged expirations.   Abdominal: Soft. Bowel sounds are normal. She exhibits no distension. There is no tenderness. There is no rebound and no guarding.  Musculoskeletal: Normal range of motion. She exhibits edema.  Neurological: She is alert and oriented to person, place, and time. No cranial nerve deficit.  Skin: Skin is warm and dry.  Psychiatric: She has a normal mood and affect. Her speech is normal.    ED Course  Procedures (including critical care time) Labs Review Labs Reviewed  CBC - Abnormal; Notable for the following:    RDW 16.5 (*)    All other components within normal limits  COMPREHENSIVE METABOLIC PANEL - Abnormal; Notable for the following:    Glucose, Bld  166 (*)    Albumin 3.1 (*)    Total Bilirubin <0.2 (*)    All other components within normal limits  PRO B NATRIURETIC PEPTIDE  TROPONIN I    Imaging Review Dg Chest 2 View  07/01/2014   CLINICAL DATA:  48 year old female with shortness of breath, sore throat, fatigue. Initial encounter.  EXAM: CHEST  2 VIEW  COMPARISON:  05/15/2014 and earlier.  FINDINGS: Stable, normal lung volumes. Normal cardiac size and mediastinal contours. Visualized tracheal air column is within normal limits. No pneumothorax, pulmonary edema, pleural effusion, or confluent pulmonary opacity. No acute osseous abnormality identified. Chronic deformity distal right clavicle.  IMPRESSION: No acute cardiopulmonary abnormality.   Electronically Signed   By: Augusto Gamble M.D.   On: 07/01/2014 18:42     EKG Interpretation   Date/Time:  Tuesday July 01 2014 17:31:18 EDT Ventricular Rate:  102 PR Interval:  123 QRS Duration:  87 QT Interval:  330 QTC Calculation: 430 R Axis:   68 Text Interpretation:  Sinus tachycardia Left atrial enlargement Minimal ST  depression, inferior leads No significant change since last tracing  Confirmed by Yaslyn Cumby  MD, Harrold Donath [phone]) on 07/01/2014 6:00:05 PM      MDM   Final diagnoses:  Asthma exacerbation  Subconjunctival bleed, left    Patient with shortness of breath. History of asthma. Likely exacerbated by viral syndrome. Patient feels better after treatment. We'll give steroids. Also has some itching and redness of the eye. Will give antibiotic drops to treat possible conjunctivitis    Juliet Rude. Rubin Payor, MD 07/02/14 0002

## 2014-07-01 NOTE — ED Notes (Addendum)
Pt A+Ox4, reports c/o SOB x3 days, worsening.  Pt reports hx of asthma and "fluid around my heart".  Pt reports needing to be intubated x1.5 years ago for similar episode.  Pt c/o central chest pressure, nonradiating.  SR/ST on monitor.  No ectopy noted at this time.  Speaking full sentences, rr even/un-lab, tachypneic.  Sats 99-100%RA.  Skin PWD.  MAEI.

## 2014-07-01 NOTE — ED Notes (Signed)
Pt wants to know test results from MD and she wants  To eat and a bottle of hand sanitizer because she thinks she has pink eye,  Pt is alert and oriented in NAD

## 2014-07-03 ENCOUNTER — Ambulatory Visit: Payer: Managed Care, Other (non HMO) | Admitting: Pulmonary Disease

## 2014-07-08 ENCOUNTER — Telehealth: Payer: Self-pay | Admitting: Pulmonary Disease

## 2014-07-08 NOTE — Telephone Encounter (Signed)
07-10-14 on injection schedule at 3pm; pt is aware.

## 2014-07-10 ENCOUNTER — Ambulatory Visit: Payer: Managed Care, Other (non HMO)

## 2014-07-11 ENCOUNTER — Ambulatory Visit: Payer: Managed Care, Other (non HMO)

## 2014-08-07 ENCOUNTER — Ambulatory Visit: Payer: Managed Care, Other (non HMO) | Admitting: Pulmonary Disease

## 2014-09-23 ENCOUNTER — Emergency Department (HOSPITAL_COMMUNITY): Payer: Managed Care, Other (non HMO)

## 2014-09-23 ENCOUNTER — Emergency Department (HOSPITAL_COMMUNITY)
Admission: EM | Admit: 2014-09-23 | Discharge: 2014-09-23 | Disposition: A | Discharge status: Home / Self Care | Payer: Managed Care, Other (non HMO) | Attending: Emergency Medicine | Admitting: Emergency Medicine

## 2014-09-23 ENCOUNTER — Encounter (HOSPITAL_COMMUNITY): Payer: Self-pay

## 2014-09-23 DIAGNOSIS — Z7951 Long term (current) use of inhaled steroids: Secondary | ICD-10-CM | POA: Insufficient documentation

## 2014-09-23 DIAGNOSIS — R062 Wheezing: Secondary | ICD-10-CM

## 2014-09-23 DIAGNOSIS — Z87891 Personal history of nicotine dependence: Secondary | ICD-10-CM | POA: Insufficient documentation

## 2014-09-23 DIAGNOSIS — I10 Essential (primary) hypertension: Secondary | ICD-10-CM | POA: Insufficient documentation

## 2014-09-23 DIAGNOSIS — J45901 Unspecified asthma with (acute) exacerbation: Secondary | ICD-10-CM | POA: Insufficient documentation

## 2014-09-23 DIAGNOSIS — M1990 Unspecified osteoarthritis, unspecified site: Secondary | ICD-10-CM | POA: Insufficient documentation

## 2014-09-23 DIAGNOSIS — J40 Bronchitis, not specified as acute or chronic: Secondary | ICD-10-CM

## 2014-09-23 DIAGNOSIS — Z8669 Personal history of other diseases of the nervous system and sense organs: Secondary | ICD-10-CM | POA: Insufficient documentation

## 2014-09-23 DIAGNOSIS — Z79899 Other long term (current) drug therapy: Secondary | ICD-10-CM | POA: Insufficient documentation

## 2014-09-23 MED ORDER — IPRATROPIUM BROMIDE 0.02 % IN SOLN
0.5000 mg | Freq: Once | RESPIRATORY_TRACT | Status: AC
  Administered 2014-09-23: 0.5 mg via RESPIRATORY_TRACT
  Filled 2014-09-23: qty 2.5

## 2014-09-23 MED ORDER — AZITHROMYCIN 250 MG PO TABS: 250.0000 mg | ORAL_TABLET | Freq: Every day | ORAL | Status: DC

## 2014-09-23 MED ORDER — PREDNISONE 20 MG PO TABS
60.0000 mg | ORAL_TABLET | Freq: Once | ORAL | Status: AC
  Administered 2014-09-23: 60 mg via ORAL
  Filled 2014-09-23: qty 3

## 2014-09-23 MED ORDER — FLUCONAZOLE 150 MG PO TABS: 150.0000 mg | ORAL_TABLET | Freq: Once | ORAL | Status: DC

## 2014-09-23 MED ORDER — HYDROCODONE-ACETAMINOPHEN 5-325 MG PO TABS: 1.0000 | ORAL_TABLET | ORAL | Status: DC | PRN

## 2014-09-23 MED ORDER — PREDNISONE (PAK) 10 MG PO TABS: ORAL_TABLET | Freq: Every day | ORAL | Status: DC

## 2014-09-23 MED ORDER — PREDNISONE 20 MG PO TABS: 60.0000 mg | ORAL_TABLET | Freq: Every day | ORAL | Status: DC

## 2014-09-23 NOTE — ED Notes (Signed)
Patient presents today with a chief complaint of productive cough with yellowish sputum and wheezing x 2 weeks without relief from albuterol inhaler and nebulizer. Denies pain.

## 2014-09-23 NOTE — ED Notes (Signed)
Bed: WA03 Expected date:  Expected time:  Means of arrival:  Comments: EMS- asthma attack

## 2014-09-23 NOTE — Discharge Instructions (Signed)
Cough, Adult  A cough is a reflex. It helps you clear your throat and airways. A cough can help heal your body. A cough can last 2 or 3 weeks (acute) or may last more than 8 weeks (chronic). Some common causes of a cough can include an infection, allergy, or a cold. HOME CARE 1. Only take medicine as told by your doctor. 2. If given, take your medicines (antibiotics) as told. Finish them even if you start to feel better. 3. Use a cold steam vaporizer or humidifier in your home. This can help loosen thick spit (secretions). 4. Sleep so you are almost sitting up (semi-upright). Use pillows to do this. This helps reduce coughing. 5. Rest as needed. 6. Stop smoking if you smoke. GET HELP RIGHT AWAY IF:  You have yellowish-white fluid (pus) in your thick spit.  Your cough gets worse.  Your medicine does not reduce coughing, and you are losing sleep.  You cough up blood.  You have trouble breathing.  Your pain gets worse and medicine does not help.  You have a fever. MAKE SURE YOU:   Understand these instructions.  Will watch your condition.  Will get help right away if you are not doing well or get worse. Document Released: 06/23/2011 Document Revised: 02/24/2014 Document Reviewed: 06/23/2011 [facility] Patient Information 2015 [place], [place]. This information is not intended to replace advice given to you by your health care provider. Make sure you discuss any questions you have with your health care provider.  How to Use an Inhaler Using your inhaler correctly is very important. Good technique will make sure that the medicine reaches your lungs.  HOW TO USE AN INHALER: 7. Take the cap off the inhaler. 8. If this is the first time using your inhaler, you need to prime it. Shake the inhaler for 5 seconds. Release four puffs into the air, away from your face. Ask your doctor for help if you have questions. 9. Shake the inhaler for 5 seconds. 10. Turn the inhaler so the bottle is  above the mouthpiece. 11. Put your pointer finger on top of the bottle. Your thumb holds the bottom of the inhaler. 12. Open your mouth. 13. Either hold the inhaler away from your mouth (the width of 2 fingers) or place your lips tightly around the mouthpiece. Ask your doctor which way to use your inhaler. 14. Breathe out as much air as possible. 15. Breathe in and push down on the bottle 1 time to release the medicine. You will feel the medicine go in your mouth and throat. 16. Continue to take a deep breath in very slowly. Try to fill your lungs. 17. After you have breathed in completely, hold your breath for 10 seconds. This will help the medicine to settle in your lungs. If you cannot hold your breath for 10 seconds, hold it for as long as you can before you breathe out. 18. Breathe out slowly, through pursed lips. Whistling is an example of pursed lips. 19. If your doctor has told you to take more than 1 puff, wait at least 15-30 seconds between puffs. This will help you get the best results from your medicine. Do not use the inhaler more than your doctor tells you to. 20. Put the cap back on the inhaler. 21. Follow the directions from your doctor or from the inhaler package about cleaning the inhaler. If you use more than one inhaler, ask your doctor which inhalers to use and what order to use  them in. Ask your doctor to help you figure out when you will need to refill your inhaler.  If you use a steroid inhaler, always rinse your mouth with water after your last puff, gargle and spit out the water. Do not swallow the water. GET HELP IF:  The inhaler medicine only partially helps to stop wheezing or shortness of breath.  You are having trouble using your inhaler.  You have some increase in thick spit (phlegm). GET HELP RIGHT AWAY IF:  The inhaler medicine does not help your wheezing or shortness of breath or you have tightness in your chest.  You have dizziness, headaches, or fast  heart rate.  You have chills, fever, or night sweats.  You have a large increase of thick spit, or your thick spit is bloody. MAKE SURE YOU:   Understand these instructions.  Will watch your condition.  Will get help right away if you are not doing well or get worse. Document Released: 07/19/2008 Document Revised: 07/31/2013 Document Reviewed: 05/09/2013 [facility] Patient Information 2015 [place], [place]. This information is not intended to replace advice given to you by your health care provider. Make sure you discuss any questions you have with your health care provider.

## 2014-09-23 NOTE — ED Provider Notes (Signed)
CSN: [id number]     Arrival date & time 09/23/14  1123 History   First MD Initiated Contact with Patient 09/23/14 1159     Chief Complaint  Patient presents with  . Shortness of Breath      HPI Patient presents today with a chief complaint of productive cough with yellowish sputum and wheezing x 2 weeks without relief from albuterol inhaler and nebulizer. Denies pain. Past Medical History  Diagnosis Date  . Arthritis   . Asthma   . Hypertension   . Influenza B April 2014    Complicated by multi-organ failure  . 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 OSA (obstructive sleep apnea) 03/20/2013   Past Surgical History  Procedure Laterality Date  . Tubal ligation    . Cesarean section     Family History  Problem Relation Age of Onset  . Hypertension Mother   . HIV/AIDS Father    History  Substance Use Topics  . Smoking status: Former Smoker -- 0.25 packs/day for 20 years    Types: Cigarettes    Quit date: 01/31/2013  . Smokeless tobacco: Never Used  . Alcohol Use: No   OB History    No data available     Review of Systems  All other systems reviewed and are negative  Allergies  Other and Tomato  Home Medications   Prior to Admission medications   Medication Sig Start Date End Date Taking? Authorizing Provider  acetaminophen (TYLENOL) 500 MG tablet Take 1,000 mg by mouth every 6 (six) hours as needed for moderate pain or headache (arthiritis).   Yes Historical Provider, MD  albuterol (PROVENTIL) (2.5 MG/3ML) 0.083% nebulizer solution Take 3 mLs (2.5 mg total) by nebulization every 6 (six) hours as needed for wheezing. DX 493.90 05/17/14  Yes Belkys A Regalado, MD  albuterol (VENTOLIN HFA) 108 (90 BASE) MCG/ACT inhaler Inhale 2 puffs into the lungs every 6 (six) hours as needed for wheezing. 05/17/14  Yes Belkys A Regalado, MD  diltiazem (CARDIZEM CD) 240 MG 24 hr capsule Take 1 capsule (240 mg total) by mouth daily. 02/21/13  Yes Bernadene PersonKathryn A Whiteheart, NP  ferrous  fumarate (HEMOCYTE - 106 MG FE) 325 (106 FE) MG TABS Take 1 tablet by mouth 2 (two) times a week.    Yes Historical Provider, MD  fluticasone (FLONASE) 50 MCG/ACT nasal spray Place 2 sprays into the nose daily. 02/21/13  Yes Bernadene PersonKathryn A Whiteheart, NP  Ibuprofen (ADVIL) 200 MG CAPS Take 600 mg by mouth once.   Yes Historical Provider, MD  OVER THE COUNTER MEDICATION Take 5 mLs by mouth daily as needed (asthma attacks). Day quill and nyquil and therflu   Yes Historical Provider, MD  Vitamin D, Ergocalciferol, (DRISDOL) 50000 UNITS CAPS Take 50,000 Units by mouth every 7 (seven) days. On thursdays.   Yes Historical Provider, MD  azithromycin (ZITHROMAX) 250 MG tablet Take 1 tablet (250 mg total) by mouth daily. Take first 2 tablets together, then 1 every day until finished. 09/23/14   Nelia Shiobert L Bunnie Rehberg, MD  HYDROcodone-acetaminophen (NORCO) 5-325 MG per tablet Take 1 tablet by mouth every 4 (four) hours as needed. Patient not taking: Reported on 09/23/2014 06/03/14   Loren Raceravid Yelverton, MD  predniSONE (DELTASONE) 20 MG tablet Take 3 tablets (60 mg total) by mouth daily. 09/23/14   Nelia Shiobert L Cyleigh Massaro, MD  predniSONE (STERAPRED UNI-PAK) 10 MG tablet Take by mouth daily. Take 6 tablets for 3 days then 4 tablet for 3 days the 2 tablets  for 2 days then stop. 09/23/14   Nelia Shiobert L Makiah Clauson, MD  sulfacetamide (BLEPH-10) 10 % ophthalmic solution Place 1 drop into the left eye every 4 (four) hours. Patient not taking: Reported on 09/23/2014 07/01/14   Juliet RudeNathan R. Pickering, MD   BP 142/78 mmHg  Pulse 99  Temp(Src) 97.3 F (36.3 C) (Oral)  Resp 20  SpO2 95%  LMP 07/24/2014 Physical Exam  Constitutional: She is oriented to person, place, and time. She appears well-developed and well-nourished. No distress.  HENT:  Head: Normocephalic and atraumatic.  Eyes: Pupils are equal, round, and reactive to light.  Neck: Normal range of motion.  Cardiovascular: Normal rate and intact distal pulses.   Pulmonary/Chest: No respiratory  distress. She has wheezes (Mild expiratory wheezes).  Abdominal: Normal appearance. She exhibits no distension.  Musculoskeletal: Normal range of motion.  Neurological: She is alert and oriented to person, place, and time. No cranial nerve deficit.  Skin: Skin is warm and dry. No rash noted.  Psychiatric: She has a normal mood and affect. Her behavior is normal.  Nursing note and vitals reviewed.   ED Course  Procedures (including critical care time) Labs Review Labs Reviewed - No data to display  Imaging Review Dg Chest 2 View  09/23/2014   CLINICAL DATA:  Productive cough, wheezing for 2 weeks, asthma  EXAM: CHEST  2 VIEW  COMPARISON:  07/01/2014  FINDINGS: Cardiomediastinal silhouette is stable. No acute infiltrate or pleural effusion. No pulmonary edema. Central mild bronchitic changes. Bony thorax is unremarkable.  IMPRESSION: No acute infiltrate or pulmonary edema. Central mild bronchitic changes.   Electronically Signed   By: Natasha MeadLiviu  Pop M.D.   On: 09/23/2014 13:17    After treatment in the ED the patient feels back to baseline and wants to go home.  MDM   Final diagnoses:  Wheezing  Bronchitis        Nelia Shiobert L Rachele Lamaster, MD 09/23/14 1416

## 2014-09-26 ENCOUNTER — Telehealth: Payer: Self-pay | Admitting: Pulmonary Disease

## 2014-09-26 NOTE — Telephone Encounter (Signed)
Called spoke with patient who is requesting handicap placard.  Advised pt that she is overdue for appt and would like for her to come in to discuss - pt refuses to see any provider other than [facility] (first opening 1.20.16 >> appt scheduled for this date at 4.30pm).  Pt is aware that [facility] is not back in the office until 12.11.15.  Dr Craige CottaSood please advise if pt may have handicap placard.  Thank you.  Per 7.30.15 ov w/ TP: Patient Instructions       Finish prednisone taper as directed. May use omeprazole 20 mg daily before meal ( in place of protonix) Begin Pepcid 20 mg at bedtime Begin Zyrtec 10 mg in a.m. May use chlorpheniramine 4 mg 2 at bedtime. Reflux diet Avoid all Mint  products Delsym 2 teaspoons twice daily as needed. For cough We are referring you to ENT for evaluation of chronic hoarseness, and possible vocal cord dysfunction Continue on CPAP   Followup with Dr. Craige CottaSood in 6 weeks and As needed

## 2014-09-26 NOTE — Telephone Encounter (Signed)
LMTCB

## 2014-10-02 NOTE — Telephone Encounter (Signed)
LM for pt that handicap sticker will be signed by Dr Craige CottaSood and left at front for pick up tomorrow afternoon.

## 2014-10-02 NOTE — Telephone Encounter (Signed)
Okay to have handicap placard for now.

## 2014-10-24 DIAGNOSIS — J189 Pneumonia, unspecified organism: Secondary | ICD-10-CM

## 2014-10-24 DIAGNOSIS — I509 Heart failure, unspecified: Secondary | ICD-10-CM

## 2014-10-24 HISTORY — DX: Heart failure, unspecified: I50.9

## 2014-10-24 HISTORY — DX: Pneumonia, unspecified organism: J18.9

## 2014-11-12 ENCOUNTER — Ambulatory Visit: Payer: Self-pay | Admitting: Pulmonary Disease

## 2014-12-10 ENCOUNTER — Emergency Department (HOSPITAL_BASED_OUTPATIENT_CLINIC_OR_DEPARTMENT_OTHER)
Admission: EM | Admit: 2014-12-10 | Discharge: 2014-12-10 | Disposition: A | Discharge status: Home / Self Care | Payer: Self-pay | Attending: Emergency Medicine | Admitting: Emergency Medicine

## 2014-12-10 ENCOUNTER — Encounter (HOSPITAL_BASED_OUTPATIENT_CLINIC_OR_DEPARTMENT_OTHER): Payer: Self-pay | Admitting: Emergency Medicine

## 2014-12-10 ENCOUNTER — Emergency Department (HOSPITAL_BASED_OUTPATIENT_CLINIC_OR_DEPARTMENT_OTHER): Payer: Self-pay

## 2014-12-10 DIAGNOSIS — I10 Essential (primary) hypertension: Secondary | ICD-10-CM | POA: Insufficient documentation

## 2014-12-10 DIAGNOSIS — Z7951 Long term (current) use of inhaled steroids: Secondary | ICD-10-CM | POA: Insufficient documentation

## 2014-12-10 DIAGNOSIS — Z792 Long term (current) use of antibiotics: Secondary | ICD-10-CM | POA: Insufficient documentation

## 2014-12-10 DIAGNOSIS — R0981 Nasal congestion: Secondary | ICD-10-CM

## 2014-12-10 DIAGNOSIS — Z79899 Other long term (current) drug therapy: Secondary | ICD-10-CM | POA: Insufficient documentation

## 2014-12-10 DIAGNOSIS — G4733 Obstructive sleep apnea (adult) (pediatric): Secondary | ICD-10-CM | POA: Insufficient documentation

## 2014-12-10 DIAGNOSIS — J441 Chronic obstructive pulmonary disease with (acute) exacerbation: Secondary | ICD-10-CM | POA: Insufficient documentation

## 2014-12-10 DIAGNOSIS — Z87891 Personal history of nicotine dependence: Secondary | ICD-10-CM | POA: Insufficient documentation

## 2014-12-10 DIAGNOSIS — Z8619 Personal history of other infectious and parasitic diseases: Secondary | ICD-10-CM | POA: Insufficient documentation

## 2014-12-10 DIAGNOSIS — Z7952 Long term (current) use of systemic steroids: Secondary | ICD-10-CM | POA: Insufficient documentation

## 2014-12-10 DIAGNOSIS — J449 Chronic obstructive pulmonary disease, unspecified: Secondary | ICD-10-CM

## 2014-12-10 DIAGNOSIS — M1990 Unspecified osteoarthritis, unspecified site: Secondary | ICD-10-CM | POA: Insufficient documentation

## 2014-12-10 MED ORDER — ALBUTEROL SULFATE (2.5 MG/3ML) 0.083% IN NEBU
5.0000 mg | INHALATION_SOLUTION | Freq: Once | RESPIRATORY_TRACT | Status: AC
  Administered 2014-12-10: 5 mg via RESPIRATORY_TRACT
  Filled 2014-12-10: qty 6

## 2014-12-10 MED ORDER — FLUTICASONE PROPIONATE 50 MCG/ACT NA SUSP: 2.0000 | Freq: Every day | NASAL | Status: DC

## 2014-12-10 MED ORDER — PREDNISONE 20 MG PO TABS
ORAL_TABLET | ORAL | Status: DC
Start: 2014-12-10 — End: 2014-12-28

## 2014-12-10 MED ORDER — PREDNISONE 20 MG PO TABS
ORAL_TABLET | ORAL | Status: AC
  Filled 2014-12-10: qty 3

## 2014-12-10 MED ORDER — PREDNISONE 10 MG PO TABS
60.0000 mg | ORAL_TABLET | Freq: Once | ORAL | Status: AC
  Administered 2014-12-10: 60 mg via ORAL

## 2014-12-10 NOTE — ED Provider Notes (Signed)
CSN: [id number]     Arrival date & time 12/10/14  0011 History   First MD Initiated Contact with Patient 12/10/14 0236     Chief Complaint  Patient presents with  . Shortness of Breath     (Consider location/radiation/quality/duration/timing/severity/associated sxs/prior Treatment) Patient is a 49 y.o. female presenting with wheezing.  Wheezing Severity:  Moderate Severity compared to prior episodes:  Similar Onset quality:  Gradual Timing:  Constant Progression:  Unchanged Chronicity:  Recurrent Context: not animal exposure   Context comment:  [place] Peers with nasal congestion Relieved by:  Nothing Worsened by:  Nothing tried Ineffective treatments:  None tried Associated symptoms: no chest pain, no chest tightness, no cough, no fever, no rash, no sore throat, no sputum production and no swollen glands   Risk factors: not exposed to toxic fumes     Past Medical History  Diagnosis Date  . Arthritis   . Asthma   . Hypertension   . Influenza B Malayasia Mirkin 2014    Complicated by multi-organ failure  . Critical illness myopathy Tawnie Ehresman 2014  . OSA (obstructive sleep apnea) 03/20/2013   Past Surgical History  Procedure Laterality Date  . Tubal ligation    . Cesarean section     Family History  Problem Relation Age of Onset  . Hypertension Mother   . HIV/AIDS Father    History  Substance Use Topics  . Smoking status: Former Smoker -- 0.25 packs/day for 20 years    Types: Cigarettes    Quit date: 01/31/2013  . Smokeless tobacco: Never Used  . Alcohol Use: No   OB History    No data available     Review of Systems  Constitutional: Negative for fever.  HENT: Positive for congestion. Negative for drooling and sore throat.   Respiratory: Positive for wheezing. Negative for cough, sputum production and chest tightness.   Cardiovascular: Negative for chest pain, palpitations and leg swelling.  Skin: Negative for rash.  All other systems reviewed and are negative.     Allergies   Other and Tomato  Home Medications   Prior to Admission medications   Medication Sig Start Date End Date Taking? Authorizing Provider  acetaminophen (TYLENOL) 500 MG tablet Take 1,000 mg by mouth every 6 (six) hours as needed for moderate pain or headache (arthiritis).    Historical Provider, MD  albuterol (PROVENTIL) (2.5 MG/3ML) 0.083% nebulizer solution Take 3 mLs (2.5 mg total) by nebulization every 6 (six) hours as needed for wheezing. DX 493.90 05/17/14   Belkys A Regalado, MD  albuterol (VENTOLIN HFA) 108 (90 BASE) MCG/ACT inhaler Inhale 2 puffs into the lungs every 6 (six) hours as needed for wheezing. 05/17/14   Belkys A Regalado, MD  azithromycin (ZITHROMAX) 250 MG tablet Take 1 tablet (250 mg total) by mouth daily. Take first 2 tablets together, then 1 every day until finished. 09/23/14   Nelia Shi, MD  diltiazem (CARDIZEM CD) 240 MG 24 hr capsule Take 1 capsule (240 mg total) by mouth daily. 02/21/13   Bernadene Person, NP  ferrous fumarate (HEMOCYTE - 106 MG FE) 325 (106 FE) MG TABS Take 1 tablet by mouth 2 (two) times a week.     Historical Provider, MD  fluconazole (DIFLUCAN) 150 MG tablet Take 1 tablet (150 mg total) by mouth once. 09/23/14   Nelia Shi, MD  fluticasone (FLONASE) 50 MCG/ACT nasal spray Place 2 sprays into the nose daily. 02/21/13   Bernadene Person, NP  HYDROcodone-acetaminophen (NORCO/VICODIN) [phone]  MG per tablet Take 1 tablet by mouth every 4 (four) hours as needed for moderate pain or severe pain. 09/23/14   Nelia Shiobert L Beaton, MD  Ibuprofen (ADVIL) 200 MG CAPS Take 600 mg by mouth once.    Historical Provider, MD  OVER THE COUNTER MEDICATION Take 5 mLs by mouth daily as needed (asthma attacks). Day quill and nyquil and therflu    Historical Provider, MD  predniSONE (DELTASONE) 20 MG tablet Take 3 tablets (60 mg total) by mouth daily. 09/23/14   Nelia Shiobert L Beaton, MD  predniSONE (STERAPRED UNI-PAK) 10 MG tablet Take by mouth daily. Take 6 tablets for 3 days  then 4 tablet for 3 days the 2 tablets for 2 days then stop. 09/23/14   Nelia Shiobert L Beaton, MD  sulfacetamide (BLEPH-10) 10 % ophthalmic solution Place 1 drop into the left eye every 4 (four) hours. Patient not taking: Reported on 09/23/2014 07/01/14   Juliet RudeNathan R. Pickering, MD  Vitamin D, Ergocalciferol, (DRISDOL) 50000 UNITS CAPS Take 50,000 Units by mouth every 7 (seven) days. On thursdays.    Historical Provider, MD   BP 124/79 mmHg  Pulse 80  Temp(Src) 98.7 F (37.1 C) (Oral)  Resp 22  Wt 260 lb (117.935 kg)  SpO2 100%  LMP 11/23/2014 Physical Exam  Constitutional: She is oriented to person, place, and time. She appears well-developed and well-nourished. No distress.  HENT:  Head: Normocephalic and atraumatic.  Mouth/Throat: Oropharynx is clear and moist.  Eyes: Conjunctivae are normal. Pupils are equal, round, and reactive to light.  Neck: Normal range of motion. Neck supple.  Cardiovascular: Normal rate and intact distal pulses.   Pulmonary/Chest: Effort normal and breath sounds normal. No stridor. No respiratory distress. She has no wheezes. She has no rales. She exhibits no tenderness.  Abdominal: Soft. Bowel sounds are normal. There is no tenderness.  Musculoskeletal: Normal range of motion.  Neurological: She is alert and oriented to person, place, and time.  Skin: Skin is warm and dry.  Psychiatric: She has a normal mood and affect.    ED Course  Procedures (including critical care time) Labs Review Labs Reviewed - No data to display  Imaging Review Dg Chest 2 View  12/10/2014   CLINICAL DATA:  Addition evaluation for shortness of breath. History COPD, asthma, hypertension.  EXAM: CHEST  2 VIEW  COMPARISON:  Prior radiograph from 10/04/2014  FINDINGS: The cardiac and mediastinal silhouettes are stable in size and contour, and remain within normal limits.  The lungs are normally inflated. Diffuse bronchitic changes again seen. No airspace consolidation, pleural effusion, or  pulmonary edema is identified. There is no pneumothorax.  No acute osseous abnormality identified.  IMPRESSION: 1. No active cardiopulmonary disease. 2. Chronic bronchitic changes, similar to prior.   Electronically Signed   By: Rise MuBenjamin  McClintock M.D.   On: 12/10/2014 01:19     EKG Interpretation None      MDM   Final diagnoses:  COPD (chronic obstructive pulmonary disease)    Will prescribe flonase for nasal congestion and steroid taper as she states she has plenty of nebs at home.  Strict return precautions given.  She is following up this week with her pulmonologist    Dasan Hardman K Adah Stoneberg-Rasch, MD 12/10/14 [phone]

## 2014-12-10 NOTE — ED Notes (Signed)
Patient states that she is a severe asthmatic and she is feeling short of breath. The patient reports that she has been SOB since 6 pm this evening. Reports that she has tried some OTC meds and nothing has helped

## 2014-12-28 ENCOUNTER — Emergency Department (HOSPITAL_BASED_OUTPATIENT_CLINIC_OR_DEPARTMENT_OTHER): Payer: Self-pay

## 2014-12-28 ENCOUNTER — Inpatient Hospital Stay (HOSPITAL_BASED_OUTPATIENT_CLINIC_OR_DEPARTMENT_OTHER)
Admission: EM | Admit: 2014-12-28 | Discharge: 2014-12-29 | DRG: 191 | Disposition: A | Discharge status: Home / Self Care | Payer: Self-pay | Attending: Internal Medicine | Admitting: Internal Medicine

## 2014-12-28 ENCOUNTER — Encounter (HOSPITAL_BASED_OUTPATIENT_CLINIC_OR_DEPARTMENT_OTHER): Payer: Self-pay | Admitting: Emergency Medicine

## 2014-12-28 ENCOUNTER — Inpatient Hospital Stay (HOSPITAL_COMMUNITY): Payer: Self-pay

## 2014-12-28 DIAGNOSIS — E662 Morbid (severe) obesity with alveolar hypoventilation: Secondary | ICD-10-CM | POA: Diagnosis present

## 2014-12-28 DIAGNOSIS — Z6841 Body Mass Index (BMI) 40.0 and over, adult: Secondary | ICD-10-CM

## 2014-12-28 DIAGNOSIS — I10 Essential (primary) hypertension: Secondary | ICD-10-CM | POA: Diagnosis present

## 2014-12-28 DIAGNOSIS — J441 Chronic obstructive pulmonary disease with (acute) exacerbation: Principal | ICD-10-CM | POA: Diagnosis present

## 2014-12-28 DIAGNOSIS — N938 Other specified abnormal uterine and vaginal bleeding: Secondary | ICD-10-CM | POA: Diagnosis present

## 2014-12-28 DIAGNOSIS — G4733 Obstructive sleep apnea (adult) (pediatric): Secondary | ICD-10-CM

## 2014-12-28 DIAGNOSIS — F17210 Nicotine dependence, cigarettes, uncomplicated: Secondary | ICD-10-CM | POA: Diagnosis present

## 2014-12-28 DIAGNOSIS — Z7952 Long term (current) use of systemic steroids: Secondary | ICD-10-CM

## 2014-12-28 DIAGNOSIS — K219 Gastro-esophageal reflux disease without esophagitis: Secondary | ICD-10-CM | POA: Diagnosis present

## 2014-12-28 DIAGNOSIS — Z91199 Patient's noncompliance with other medical treatment and regimen due to unspecified reason: Secondary | ICD-10-CM

## 2014-12-28 DIAGNOSIS — J45901 Unspecified asthma with (acute) exacerbation: Secondary | ICD-10-CM | POA: Diagnosis present

## 2014-12-28 DIAGNOSIS — Z9119 Patient's noncompliance with other medical treatment and regimen: Secondary | ICD-10-CM | POA: Diagnosis present

## 2014-12-28 DIAGNOSIS — J40 Bronchitis, not specified as acute or chronic: Secondary | ICD-10-CM | POA: Diagnosis present

## 2014-12-28 DIAGNOSIS — D649 Anemia, unspecified: Secondary | ICD-10-CM | POA: Diagnosis present

## 2014-12-28 LAB — CBC
HCT: 32.4 % — ABNORMAL LOW (ref 36.0–46.0)
Hemoglobin: 10.3 g/dL — ABNORMAL LOW (ref 12.0–15.0)
MCH: 27.7 pg (ref 26.0–34.0)
MCHC: 31.8 g/dL (ref 30.0–36.0)
MCV: 87.1 fL (ref 78.0–100.0)
PLATELETS: 286 10*3/uL (ref 150–400)
RBC: 3.72 MIL/uL — ABNORMAL LOW (ref 3.87–5.11)
RDW: 16.1 % — ABNORMAL HIGH (ref 11.5–15.5)
WBC: 12.6 10*3/uL — ABNORMAL HIGH (ref 4.0–10.5)

## 2014-12-28 LAB — I-STAT ARTERIAL BLOOD GAS, ED
BICARBONATE: 23.6 meq/L (ref 20.0–24.0)
O2 SAT: 92 %
PO2 ART: 63 mmHg — AB (ref 80.0–100.0)
TCO2: 25 mmol/L (ref 0–100)
pCO2 arterial: 35 mmHg (ref 35.0–45.0)
pH, Arterial: 7.439 (ref 7.350–7.450)

## 2014-12-28 LAB — COMPREHENSIVE METABOLIC PANEL
ALT: 20 U/L (ref 0–35)
ANION GAP: 1 — AB (ref 5–15)
AST: 16 U/L (ref 0–37)
Albumin: 3.4 g/dL — ABNORMAL LOW (ref 3.5–5.2)
Alkaline Phosphatase: 56 U/L (ref 39–117)
BUN: 13 mg/dL (ref 6–23)
CALCIUM: 8 mg/dL — AB (ref 8.4–10.5)
CHLORIDE: 109 mmol/L (ref 96–112)
CO2: 26 mmol/L (ref 19–32)
CREATININE: 0.79 mg/dL (ref 0.50–1.10)
Glucose, Bld: 93 mg/dL (ref 70–99)
POTASSIUM: 3.8 mmol/L (ref 3.5–5.1)
SODIUM: 136 mmol/L (ref 135–145)
Total Bilirubin: 0.3 mg/dL (ref 0.3–1.2)
Total Protein: 7 g/dL (ref 6.0–8.3)

## 2014-12-28 LAB — GLUCOSE, CAPILLARY: Glucose-Capillary: 167 mg/dL — ABNORMAL HIGH (ref 70–99)

## 2014-12-28 LAB — CBC WITH DIFFERENTIAL/PLATELET
Basophils Absolute: 0 10*3/uL (ref 0.0–0.1)
Basophils Relative: 0 % (ref 0–1)
EOS ABS: 0.3 10*3/uL (ref 0.0–0.7)
Eosinophils Relative: 2 % (ref 0–5)
HCT: 35.7 % — ABNORMAL LOW (ref 36.0–46.0)
HEMOGLOBIN: 11.5 g/dL — AB (ref 12.0–15.0)
LYMPHS PCT: 20 % (ref 12–46)
Lymphs Abs: 2.9 10*3/uL (ref 0.7–4.0)
MCH: 27.6 pg (ref 26.0–34.0)
MCHC: 32.2 g/dL (ref 30.0–36.0)
MCV: 85.8 fL (ref 78.0–100.0)
Monocytes Absolute: 1.3 10*3/uL — ABNORMAL HIGH (ref 0.1–1.0)
Monocytes Relative: 9 % (ref 3–12)
Neutro Abs: 10.4 10*3/uL — ABNORMAL HIGH (ref 1.7–7.7)
Neutrophils Relative %: 69 % (ref 43–77)
PLATELETS: 316 10*3/uL (ref 150–400)
RBC: 4.16 MIL/uL (ref 3.87–5.11)
RDW: 16 % — AB (ref 11.5–15.5)
WBC: 14.9 10*3/uL — ABNORMAL HIGH (ref 4.0–10.5)

## 2014-12-28 LAB — CREATININE, SERUM
CREATININE: 0.78 mg/dL (ref 0.50–1.10)
GFR calc Af Amer: >90 mL/min (ref >90)

## 2014-12-28 LAB — MAGNESIUM: MAGNESIUM: 1.6 mg/dL (ref 1.5–2.5)

## 2014-12-28 MED ORDER — LORATADINE 10 MG PO TABS
10.0000 mg | ORAL_TABLET | Freq: Once | ORAL | Status: AC
  Administered 2014-12-28: 10 mg via ORAL
  Filled 2014-12-28: qty 1

## 2014-12-28 MED ORDER — HEPARIN SODIUM (PORCINE) 5000 UNIT/ML IJ SOLN
5000.0000 [IU] | Freq: Three times a day (TID) | INTRAMUSCULAR | Status: DC
  Administered 2014-12-28: 5000 [IU] via SUBCUTANEOUS
  Filled 2014-12-28 (×2): qty 1

## 2014-12-28 MED ORDER — FERROUS FUMARATE 325 (106 FE) MG PO TABS
1.0000 | ORAL_TABLET | ORAL | Status: DC
  Administered 2014-12-29: 106 mg via ORAL
  Filled 2014-12-28: qty 1

## 2014-12-28 MED ORDER — ACETAMINOPHEN 650 MG RE SUPP: 650.0000 mg | Freq: Four times a day (QID) | RECTAL | Status: DC | PRN

## 2014-12-28 MED ORDER — ACETAMINOPHEN 500 MG PO TABS
1000.0000 mg | ORAL_TABLET | Freq: Once | ORAL | Status: AC
  Administered 2014-12-28: 1000 mg via ORAL
  Filled 2014-12-28: qty 2

## 2014-12-28 MED ORDER — SODIUM CHLORIDE 0.9 % IJ SOLN: 3.0000 mL | Freq: Two times a day (BID) | INTRAMUSCULAR | Status: DC

## 2014-12-28 MED ORDER — HYDROCODONE-ACETAMINOPHEN 5-325 MG PO TABS: 1.0000 | ORAL_TABLET | ORAL | Status: DC | PRN

## 2014-12-28 MED ORDER — CEFTRIAXONE SODIUM 1 G IJ SOLR
INTRAMUSCULAR | Status: AC
Start: 2014-12-28 — End: 2014-12-28
  Filled 2014-12-28: qty 10

## 2014-12-28 MED ORDER — ACETAMINOPHEN 500 MG PO TABS
1000.0000 mg | ORAL_TABLET | Freq: Four times a day (QID) | ORAL | Status: DC | PRN
  Filled 2014-12-28: qty 2

## 2014-12-28 MED ORDER — DILTIAZEM HCL ER COATED BEADS 240 MG PO CP24
240.0000 mg | ORAL_CAPSULE | Freq: Every day | ORAL | Status: DC
  Administered 2014-12-28 – 2014-12-29 (×2): 240 mg via ORAL
  Filled 2014-12-28 (×2): qty 1

## 2014-12-28 MED ORDER — MAGNESIUM SULFATE 2 GM/50ML IV SOLN
2.0000 g | Freq: Once | INTRAVENOUS | Status: AC
  Administered 2014-12-28: 2 g via INTRAVENOUS
  Filled 2014-12-28: qty 50

## 2014-12-28 MED ORDER — FLUTICASONE PROPIONATE 50 MCG/ACT NA SUSP
2.0000 | Freq: Every day | NASAL | Status: DC
  Administered 2014-12-28 – 2014-12-29 (×2): 2 via NASAL
  Filled 2014-12-28: qty 16

## 2014-12-28 MED ORDER — DEXTROSE 5 % IV SOLN
500.0000 mg | Freq: Once | INTRAVENOUS | Status: AC
  Administered 2014-12-28: 500 mg via INTRAVENOUS
  Filled 2014-12-28: qty 500

## 2014-12-28 MED ORDER — IPRATROPIUM-ALBUTEROL 0.5-2.5 (3) MG/3ML IN SOLN
3.0000 mL | RESPIRATORY_TRACT | Status: DC
Start: 2014-12-28 — End: 2014-12-29
  Administered 2014-12-28 (×3): 3 mL via RESPIRATORY_TRACT
  Filled 2014-12-28 (×3): qty 3

## 2014-12-28 MED ORDER — ONDANSETRON HCL 4 MG/2ML IJ SOLN: 4.0000 mg | Freq: Four times a day (QID) | INTRAMUSCULAR | Status: DC | PRN

## 2014-12-28 MED ORDER — ALBUTEROL (5 MG/ML) CONTINUOUS INHALATION SOLN
15.0000 mg/h | INHALATION_SOLUTION | RESPIRATORY_TRACT | Status: DC
  Administered 2014-12-28: 15 mg/h via RESPIRATORY_TRACT
  Filled 2014-12-28: qty 20

## 2014-12-28 MED ORDER — ACETAMINOPHEN 325 MG PO TABS
650.0000 mg | ORAL_TABLET | Freq: Four times a day (QID) | ORAL | Status: DC | PRN
  Administered 2014-12-29: 500 mg via ORAL

## 2014-12-28 MED ORDER — VITAMIN D (ERGOCALCIFEROL) 1.25 MG (50000 UNIT) PO CAPS
50000.0000 [IU] | ORAL_CAPSULE | ORAL | Status: DC
  Administered 2014-12-29: 50000 [IU] via ORAL
  Filled 2014-12-28: qty 1

## 2014-12-28 MED ORDER — POLYETHYLENE GLYCOL 3350 17 G PO PACK: 17.0000 g | PACK | Freq: Every day | ORAL | Status: DC | PRN

## 2014-12-28 MED ORDER — DEXTROSE 5 % IV SOLN
1.0000 g | Freq: Once | INTRAVENOUS | Status: AC
  Administered 2014-12-28: 1 g via INTRAVENOUS

## 2014-12-28 MED ORDER — LEVOFLOXACIN IN D5W 500 MG/100ML IV SOLN
500.0000 mg | INTRAVENOUS | Status: DC
  Administered 2014-12-28: 500 mg via INTRAVENOUS
  Filled 2014-12-28 (×2): qty 100

## 2014-12-28 MED ORDER — FAMOTIDINE IN NACL 20-0.9 MG/50ML-% IV SOLN
20.0000 mg | Freq: Once | INTRAVENOUS | Status: AC
  Administered 2014-12-28: 20 mg via INTRAVENOUS
  Filled 2014-12-28: qty 50

## 2014-12-28 MED ORDER — METHYLPREDNISOLONE SODIUM SUCC 125 MG IJ SOLR
60.0000 mg | Freq: Four times a day (QID) | INTRAMUSCULAR | Status: DC
  Administered 2014-12-28 – 2014-12-29 (×4): 60 mg via INTRAVENOUS
  Filled 2014-12-28 (×4): qty 2

## 2014-12-28 MED ORDER — BENZONATATE 100 MG PO CAPS
200.0000 mg | ORAL_CAPSULE | Freq: Three times a day (TID) | ORAL | Status: DC
  Administered 2014-12-28 – 2014-12-29 (×3): 200 mg via ORAL
  Filled 2014-12-28 (×3): qty 2

## 2014-12-28 MED ORDER — ALUM & MAG HYDROXIDE-SIMETH 200-200-20 MG/5ML PO SUSP: 30.0000 mL | Freq: Four times a day (QID) | ORAL | Status: DC | PRN

## 2014-12-28 MED ORDER — SODIUM CHLORIDE 0.9 % IV SOLN
INTRAVENOUS | Status: DC
  Administered 2014-12-28: 1000 mL via INTRAVENOUS
  Administered 2014-12-29: 02:00:00 via INTRAVENOUS

## 2014-12-28 MED ORDER — BUDESONIDE 0.25 MG/2ML IN SUSP
0.2500 mg | Freq: Two times a day (BID) | RESPIRATORY_TRACT | Status: DC
  Administered 2014-12-28 – 2014-12-29 (×2): 0.25 mg via RESPIRATORY_TRACT
  Filled 2014-12-28 (×4): qty 2

## 2014-12-28 MED ORDER — ZOLPIDEM TARTRATE 5 MG PO TABS: 5.0000 mg | ORAL_TABLET | Freq: Every evening | ORAL | Status: DC | PRN

## 2014-12-28 MED ORDER — IPRATROPIUM-ALBUTEROL 0.5-2.5 (3) MG/3ML IN SOLN
3.0000 mL | Freq: Once | RESPIRATORY_TRACT | Status: AC
  Administered 2014-12-28: 3 mL via RESPIRATORY_TRACT
  Filled 2014-12-28: qty 3

## 2014-12-28 MED ORDER — HYDROCODONE-HOMATROPINE 5-1.5 MG/5ML PO SYRP: 5.0000 mL | ORAL_SOLUTION | ORAL | Status: DC | PRN

## 2014-12-28 MED ORDER — METHYLPREDNISOLONE SODIUM SUCC 125 MG IJ SOLR
125.0000 mg | Freq: Once | INTRAMUSCULAR | Status: AC
  Administered 2014-12-28: 125 mg via INTRAVENOUS
  Filled 2014-12-28: qty 2

## 2014-12-28 MED ORDER — ALBUTEROL SULFATE (2.5 MG/3ML) 0.083% IN NEBU
2.5000 mg | INHALATION_SOLUTION | Freq: Once | RESPIRATORY_TRACT | Status: AC
  Administered 2014-12-28: 2.5 mg via RESPIRATORY_TRACT
  Filled 2014-12-28: qty 3

## 2014-12-28 MED ORDER — DOCUSATE SODIUM 100 MG PO CAPS
100.0000 mg | ORAL_CAPSULE | Freq: Two times a day (BID) | ORAL | Status: DC
  Administered 2014-12-28 – 2014-12-29 (×2): 100 mg via ORAL
  Filled 2014-12-28 (×3): qty 1

## 2014-12-28 MED ORDER — ONDANSETRON HCL 4 MG PO TABS: 4.0000 mg | ORAL_TABLET | Freq: Four times a day (QID) | ORAL | Status: DC | PRN

## 2014-12-28 NOTE — H&P (Signed)
[facility] History and Physical                                                                                    Kathleen Rodriguez, is a 49 y.o. female  MRN: [id number]   DOB - 1966/01/15  Admit Date - 12/28/2014  Outpatient Primary MD for the patient is Lorenda Ishihara  With History of -  Past Medical History  Diagnosis Date  . Arthritis   . Asthma   . Hypertension   . Influenza B April 2014    Complicated by multi-organ failure  . 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 OSA (obstructive sleep apnea) 03/20/2013      Past Surgical History  Procedure Laterality Date  . Tubal ligation    . Cesarean section      in for   Chief Complaint  Patient presents with  . Shortness of Breath     HPI Kathleen Rodriguez  is a 49 y.o. female, history of asthma, hypertension, obstructive sleep apnea, and obesity. Patient presented to [facility] with complaints of sore throat with chest tightness and neck clamminess that began about 2 days ago. She reports that the chest tightness occurred with deep breathing and coughing. The same time she developed a productive cough of thick yellow sputum. She thought she had flu-type symptoms so begin TheraFlu, Robitussin, hot tea and even took some of her husband's leftover prednisone without any improvement in her symptoms. Prior to coming to the ER she noticed she had to sit up on the side of the bed or in a chair to breathe better. She reports that her pulmonologist Dr. Craige Cotta had recently discontinued her Symbicort because this was triggering asthma episodes and also discontinued one of her blood pressure medicines (? ACE I). She also states she had run out of her albuterol MDI. She reports her appetite has been good and she's been drinking well. In addition to respiratory symptoms she reports that she thinks she is going through menopause and she has waxing and waning menstrual cycles. She states she is having her menses right now and  typically has very heavy bleeding. It is noted she has underlying anemia.  By the time she presented to the ER she was having significant difficulty breathing and was unable to speak in full sentences. He was given IV steroids, as well as a continuous nebulizer treatment she continued to have significant wheezing. She was given empiric Rocephin and Zithromax to cover for tracheobronchitis. Her chest x-ray was negative for infiltrate or other evidence of pneumonia. She was afebrile at presentation but was quite tachycardic with a pulse of 125, she also had tachypnea with respiratory rate of 28. She was normotensive. She was maintaining O2 saturations around 97% on room air. She was subsequently transferred to [facility] for admission.  Review of Systems   In addition to the HPI above,  Subjective fevers with myalgias No Headache, changes with Vision or hearing, new weakness, tingling, numbness in any extremity, No problems swallowing food or Liquids, indigestion/reflux No Abdominal pain, N/V; no melena or hematochezia, no dark tarry stools, Bowel movements are regular,  No dysuria, hematuria or flank pain No new skin rashes, lesions, masses or bruises, No new joints pains-aches No recent weight gain or loss No polyuria, polydypsia or polyphagia,  *A full 10 point Review of Systems was done, except as stated above, all other Review of Systems were negative.  Social History History  Substance Use Topics  . Smoking status: Former Smoker -- 0.25 packs/day for 20 years    Types: Cigarettes    Quit date: 01/31/2013  . Smokeless tobacco: Never Used  . Alcohol Use: No    Family History Family History  Problem Relation Age of Onset  . Hypertension Mother   . HIV/AIDS Father     Prior to Admission medications   Medication Sig Start Date End Date Taking? Authorizing Provider  acetaminophen (TYLENOL) 500 MG tablet Take 1,000 mg by mouth every 6 (six) hours as needed for moderate pain  or headache (arthiritis).    Historical Provider, MD  albuterol (PROVENTIL) (2.5 MG/3ML) 0.083% nebulizer solution Take 3 mLs (2.5 mg total) by nebulization every 6 (six) hours as needed for wheezing. DX 493.90 05/17/14   Belkys A Regalado, MD  albuterol (VENTOLIN HFA) 108 (90 BASE) MCG/ACT inhaler Inhale 2 puffs into the lungs every 6 (six) hours as needed for wheezing. 05/17/14   Belkys A Regalado, MD  azithromycin (ZITHROMAX) 250 MG tablet Take 1 tablet (250 mg total) by mouth daily. Take first 2 tablets together, then 1 every day until finished. 09/23/14   Nelia Shi, MD  diltiazem (CARDIZEM CD) 240 MG 24 hr capsule Take 1 capsule (240 mg total) by mouth daily. 02/21/13   Bernadene Person, NP  ferrous fumarate (HEMOCYTE - 106 MG FE) 325 (106 FE) MG TABS Take 1 tablet by mouth 2 (two) times a week.     Historical Provider, MD  fluconazole (DIFLUCAN) 150 MG tablet Take 1 tablet (150 mg total) by mouth once. 09/23/14   Nelia Shi, MD  fluticasone (FLONASE) 50 MCG/ACT nasal spray Place 2 sprays into the nose daily. 02/21/13   Bernadene Person, NP  fluticasone (FLONASE) 50 MCG/ACT nasal spray Place 2 sprays into both nostrils daily. 12/10/14   April Smitty Cords, MD  HYDROcodone-acetaminophen (NORCO/VICODIN) 5-325 MG per tablet Take 1 tablet by mouth every 4 (four) hours as needed for moderate pain or severe pain. 09/23/14   Nelia Shi, MD  Ibuprofen (ADVIL) 200 MG CAPS Take 600 mg by mouth once.    Historical Provider, MD  OVER THE COUNTER MEDICATION Take 5 mLs by mouth daily as needed (asthma attacks). Day quill and nyquil and therflu    Historical Provider, MD  predniSONE (DELTASONE) 20 MG tablet Take 3 tablets (60 mg total) by mouth daily. 09/23/14   Nelia Shi, MD  predniSONE (DELTASONE) 20 MG tablet 3 tabs po day one, then 2 po daily x 4 days 12/10/14   April K Palumbo-Rasch, MD  predniSONE (STERAPRED UNI-PAK) 10 MG tablet Take by mouth daily. Take 6 tablets for 3 days then 4  tablet for 3 days the 2 tablets for 2 days then stop. 09/23/14   Nelia Shi, MD  sulfacetamide (BLEPH-10) 10 % ophthalmic solution Place 1 drop into the left eye every 4 (four) hours. Patient not taking: Reported on 09/23/2014 07/01/14   Juliet Rude. Pickering, MD  Vitamin D, Ergocalciferol, (DRISDOL) 50000 UNITS CAPS Take 50,000 Units by mouth every 7 (seven) days. On thursdays.    Historical Provider, MD    Allergies  Allergen Reactions  . Other Hives and Itching    Ketchup  . Tomato Hives and Itching    Physical Exam  Vitals  Blood pressure 141/78, pulse 112, temperature 98.1 F (36.7 C), temperature source Oral, resp. rate 24, height  (1.676 m), weight 279 lb 11.2 oz (126.871 kg), last menstrual period 11/23/2014, SpO2 97 %.   General:  In no acute distress, appears healthy and well nourished  Psych:  Normal affect, Denies Suicidal or Homicidal ideations, Awake Alert, Oriented X 3. Speech and thought patterns are clear and appropriate, no apparent short term memory deficits  Neuro:   No focal neurological deficits, CN II through XII intact, Strength 5/5 all 4 extremities, Sensation intact all 4 extremities.  ENT:  Ears and Eyes appear Normal, Conjunctivae clear, PER. Moist oral mucosa without erythema or exudates.  Neck:  Supple, No lymphadenopathy appreciated  Respiratory:  Symmetrical chest wall movement, Fair air movement bilaterally, scattered bilateral wheezes. Paroxysmal coughing with respiratory effort, Room Air, able to speak in complete sentences  Cardiac:  RRR, No Murmurs, no LE edema noted, no JVD, No carotid bruits, peripheral pulses palpable at 2+  Abdomen:  Positive bowel sounds, Soft, Non tender, Non distended,  No masses appreciated, no obvious hepatosplenomegaly  Skin:  No Cyanosis, Normal Skin Turgor, No Skin Rash or Bruise.  Extremities: Symmetrical without obvious trauma or injury,  no effusions.  Data Review  CBC  Recent Labs Lab  12/28/14 0441  WBC 14.9*  HGB 11.5*  HCT 35.7*  PLT 316  MCV 85.8  MCH 27.6  MCHC 32.2  RDW 16.0*  LYMPHSABS 2.9  MONOABS 1.3*  EOSABS 0.3  BASOSABS 0.0    Chemistries   Recent Labs Lab 12/28/14 0441  NA 136  K 3.8  CL 109  CO2 26  GLUCOSE 93  BUN 13  CREATININE 0.79  CALCIUM 8.0*  MG 1.6  AST 16  ALT 20  ALKPHOS 56  BILITOT 0.3    estimated creatinine clearance is 115.9 mL/min (by C-G formula based on Cr of 0.79).  No results for input(s): TSH, T4TOTAL, T3FREE, THYROIDAB in the last 72 hours.  Invalid input(s): FREET3  Coagulation profile No results for input(s): INR, PROTIME in the last 168 hours.  No results for input(s): DDIMER in the last 72 hours.  Cardiac Enzymes No results for input(s): CKMB, TROPONINI, MYOGLOBIN in the last 168 hours.  Invalid input(s): CK  Invalid input(s): POCBNP  Urinalysis    Component Value Date/Time   COLORURINE AMBER* 03/22/2014 0805   APPEARANCEUR CLOUDY* 03/22/2014 0805   LABSPEC 1.035* 03/22/2014 0805   PHURINE 5.5 03/22/2014 0805   GLUCOSEU NEGATIVE 03/22/2014 0805   HGBUR NEGATIVE 03/22/2014 0805   BILIRUBINUR SMALL* 03/22/2014 0805   KETONESUR 15* 03/22/2014 0805   PROTEINUR NEGATIVE 03/22/2014 0805   UROBILINOGEN 1.0 03/22/2014 0805   NITRITE NEGATIVE 03/22/2014 0805   LEUKOCYTESUR NEGATIVE 03/22/2014 0805    Imaging results:   Dg Chest 2 View  12/10/2014   CLINICAL DATA:  Addition evaluation for shortness of breath. History COPD, asthma, hypertension.  EXAM: CHEST  2 VIEW  COMPARISON:  Prior radiograph from 10/04/2014  FINDINGS: The cardiac and mediastinal silhouettes are stable in size and contour, and remain within normal limits.  The lungs are normally inflated. Diffuse bronchitic changes again seen. No airspace consolidation, pleural effusion, or pulmonary edema is identified. There is no pneumothorax.  No acute osseous abnormality identified.  IMPRESSION: 1. No active cardiopulmonary disease. 2.  Chronic bronchitic changes, similar to prior.   Electronically Signed   By: Rise MuBenjamin  McClintock M.D.   On: 12/10/2014 01:19   Dg Chest Portable 1 View  12/28/2014   CLINICAL DATA:  Shortness of breath and wheezing for 2 days  EXAM: PORTABLE CHEST - 1 VIEW  COMPARISON:  12/10/2014  FINDINGS: Normal heart size and mediastinal contours. There is stable visible depression of the left mainstem bronchus. No acute infiltrate or edema. No effusion or pneumothorax. No acute osseous findings.  IMPRESSION: Negative portable chest.   Electronically Signed   By: Marnee SpringJonathon  Watts M.D.   On: 12/28/2014 05:10     Assessment & Plan  Active Problems:   Asthma exacerbation/COPD /Tracheobronchitis -Admit to telemetry -Continue empiric antibiotics but since has underlying obstructive airway disease changed to Levaquin -Continue IV steroids -Continue bronchodilator with DuoNeb -Add budesonide nebulizer -Tessalon Perles and Hycodan when necessary -Patient reports has waxing and waning smoking habits and his just restarted "a few puffs" several weeks ago -Currently not utilizing oxygen but will order in case she develops hypoxemia -IV fluid for insensible volume loss -ck sputum culture    OSA  -Does not utilize CPAP    Hypertension -Patient reports pulmonologist recently discontinued one of her antihypertensive medications which I suspect was an ACE inhibitor -Patient states she quit taking the alternative agent prescribed -Current blood pressure stable so monitor off meds    Dysfunctional uterine bleeding/anemia -Continue preadmission iron -Possibly related to onset of menopause but as precaution we'll check transvaginal ultrasound to rule out fibroids or other potential source for DUB -ck anemia panel    Medically noncompliant -See above regarding antihypertensive therapy and continued smoking    GERD  -Suspects contributes to some of patient's recurrent asthma symptoms    Morbid obesity. -Suspect  has obesity hypoventilation syndrome as well     DVT Prophylaxis: Subcutaneous heparin  Family Communication:  No family at bedside   Code Status:  Full code  Condition:  Stable  Time spent in minutes : 60   Anyae Griffith L. ANP on 12/28/2014 at 3:34 PM  Between 7am to 7pm - Pager - [phone]  After 7pm go to [email or web address] - password [email or web address]  And look for the night coverage person covering me after hours  [facility]

## 2014-12-28 NOTE — ED Notes (Signed)
Pt reports has had symptoms for the last 2 days was taking aerosol Tx and prednisone pills at home without relief arrived to ED with audible wheezing and very SOB

## 2014-12-28 NOTE — ED Notes (Signed)
Patient c/o left hand IV burning, area above site puffy and painful. Zithromax was going through the IV site. Removed and heat pack applied to painful area. EDP made aware.

## 2014-12-28 NOTE — ED Provider Notes (Addendum)
CSN: [id number]     Arrival date & time 12/28/14  0420 History   First MD Initiated Contact with Patient 12/28/14 [phone]     Chief Complaint  Patient presents with  . Shortness of Breath     (Consider location/radiation/quality/duration/timing/severity/associated sxs/prior Treatment) Patient is a 49 y.o. female presenting with shortness of breath. The history is provided by medical records and the patient. The history is limited by the condition of the patient.  Shortness of Breath Severity:  Severe Onset quality:  Gradual Timing:  Constant Progression:  Worsening Chronicity:  Recurrent Context: smoke exposure   Relieved by:  Nothing Worsened by:  Nothing tried Ineffective treatments:  None tried Associated symptoms: wheezing   Wheezing:    Severity:  Severe   Onset quality:  Gradual Risk factors: tobacco use     Past Medical History  Diagnosis Date  . Arthritis   . Asthma   . Hypertension   . 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 Critical illness myopathy Kathleen Rodriguez 2014  . OSA (obstructive sleep apnea) 03/20/2013   Past Surgical History  Procedure Laterality Date  . Tubal ligation    . Cesarean section     Family History  Problem Relation Age of Onset  . Hypertension Mother   . HIV/AIDS Father    History  Substance Use Topics  . Smoking status: Former Smoker -- 0.25 packs/day for 20 years    Types: Cigarettes    Quit date: 01/31/2013  . Smokeless tobacco: Never Used  . Alcohol Use: No   OB History    No data available     Review of Systems  Unable to perform ROS Respiratory: Positive for shortness of breath and wheezing.       Allergies  Other and Tomato  Home Medications   Prior to Admission medications   Medication Sig Start Date End Date Taking? Authorizing Provider  acetaminophen (TYLENOL) 500 MG tablet Take 1,000 mg by mouth every 6 (six) hours as needed for moderate pain or headache (arthiritis).    Historical  Provider, MD  albuterol (PROVENTIL) (2.5 MG/3ML) 0.083% nebulizer solution Take 3 mLs (2.5 mg total) by nebulization every 6 (six) hours as needed for wheezing. DX 493.90 05/17/14   Kathleen A Regalado, MD  albuterol (VENTOLIN HFA) 108 (90 BASE) MCG/ACT inhaler Inhale 2 puffs into the lungs every 6 (six) hours as needed for wheezing. 05/17/14   Kathleen A Regalado, MD  azithromycin (ZITHROMAX) 250 MG tablet Take 1 tablet (250 mg total) by mouth daily. Take first 2 tablets together, then 1 every day until finished. 09/23/14   Kathleen Shi, MD  diltiazem (CARDIZEM CD) 240 MG 24 hr capsule Take 1 capsule (240 mg total) by mouth daily. 02/21/13   Kathleen Person, NP  ferrous fumarate (HEMOCYTE - 106 MG FE) 325 (106 FE) MG TABS Take 1 tablet by mouth 2 (two) times a week.     Historical Provider, MD  fluconazole (DIFLUCAN) 150 MG tablet Take 1 tablet (150 mg total) by mouth once. 09/23/14   Kathleen Shi, MD  fluticasone (FLONASE) 50 MCG/ACT nasal spray Place 2 sprays into the nose daily. 02/21/13   Kathleen Person, NP  fluticasone (FLONASE) 50 MCG/ACT nasal spray Place 2 sprays into both nostrils daily. 12/10/14   Kathleen Kessner Smitty Cords, MD  HYDROcodone-acetaminophen (NORCO/VICODIN) 5-325 MG per tablet Take 1 tablet by mouth every 4 (four) hours as needed for moderate pain or severe pain.  09/23/14   Kathleen Shiobert L Beaton, MD  Ibuprofen (ADVIL) 200 MG CAPS Take 600 mg by mouth once.    Historical Provider, MD  OVER THE COUNTER MEDICATION Take 5 mLs by mouth daily as needed (asthma attacks). Day quill and nyquil and therflu    Historical Provider, MD  predniSONE (DELTASONE) 20 MG tablet Take 3 tablets (60 mg total) by mouth daily. 09/23/14   Kathleen Shiobert L Beaton, MD  predniSONE (DELTASONE) 20 MG tablet 3 tabs po day one, then 2 po daily x 4 days 12/10/14   Kathleen Manny K Ousmane Seeman-Rasch, MD  predniSONE (STERAPRED UNI-PAK) 10 MG tablet Take by mouth daily. Take 6 tablets for 3 days then 4 tablet for 3 days the 2 tablets for 2 days  then stop. 09/23/14   Kathleen Shiobert L Beaton, MD  sulfacetamide (BLEPH-10) 10 % ophthalmic solution Place 1 drop into the left eye every 4 (four) hours. Patient not taking: Reported on 09/23/2014 07/01/14   Kathleen RudeNathan R. Pickering, MD  Vitamin D, Ergocalciferol, (DRISDOL) 50000 UNITS CAPS Take 50,000 Units by mouth every 7 (seven) days. On thursdays.    Historical Provider, MD   SpO2 97%  LMP 11/23/2014 Physical Exam  Constitutional: She is oriented to Rodriguez, place, and time. She appears well-developed and well-nourished. She appears distressed.  HENT:  Head: Normocephalic and atraumatic.  Right Ear: External ear normal.  Left Ear: External ear normal.  Eyes: Conjunctivae are normal. Pupils are equal, round, and reactive to light.  Neck: Normal range of motion. Neck supple.  Cardiovascular: Regular rhythm.  Tachycardia present.   Pulmonary/Chest: Accessory muscle usage present. No stridor. Tachypnea noted. She has decreased breath sounds. She has no rales.  Abdominal: Soft. Bowel sounds are normal. There is no tenderness. There is no rebound and no guarding.  Musculoskeletal: Normal range of motion.  Neurological: She is alert and oriented to Rodriguez, place, and time.  Skin: Skin is warm and dry.  Psychiatric: She has a normal mood and affect.    ED Course  Procedures (including critical care time) Labs Review Labs Reviewed  CBC WITH DIFFERENTIAL/PLATELET - Abnormal; Notable for the following:    WBC 14.9 (*)    Hemoglobin 11.5 (*)    HCT 35.7 (*)    RDW 16.0 (*)    Neutro Abs 10.4 (*)    Monocytes Absolute 1.3 (*)    All other components within normal limits  COMPREHENSIVE METABOLIC PANEL - Abnormal; Notable for the following:    Calcium 8.0 (*)    Albumin 3.4 (*)    Anion gap 1 (*)    All other components within normal limits  I-STAT ARTERIAL BLOOD GAS, ED - Abnormal; Notable for the following:    pO2, Arterial 63.0 (*)    All other components within normal limits  MAGNESIUM     Imaging Review Dg Chest Portable 1 View  12/28/2014   CLINICAL DATA:  Shortness of breath and wheezing for 2 days  EXAM: PORTABLE CHEST - 1 VIEW  COMPARISON:  12/10/2014  FINDINGS: Normal heart size and mediastinal contours. There is stable visible depression of the left mainstem bronchus. No acute infiltrate or edema. No effusion or pneumothorax. No acute osseous findings.  IMPRESSION: Negative portable chest.   Electronically Signed   By: Kathleen SpringJonathon  Rodriguez M.D.   On: 12/28/2014 05:10     EKG Interpretation None      MDM   Final diagnoses:  COPD exacerbation    Results for orders placed or performed during the  hospital encounter of 12/28/14  CBC with Differential  Result Value Ref Range   WBC 14.9 (H) 4.0 - 10.5 K/uL   RBC 4.16 3.87 - 5.11 MIL/uL   Hemoglobin 11.5 (L) 12.0 - 15.0 g/dL   HCT 16.1 (L) 09.6 - 04.5 %   MCV 85.8 78.0 - 100.0 fL   MCH 27.6 26.0 - 34.0 pg   MCHC 32.2 30.0 - 36.0 g/dL   RDW 40.9 (H) 81.1 - 91.4 %   Platelets 316 150 - 400 K/uL   Neutrophils Relative % 69 43 - 77 %   Neutro Abs 10.4 (H) 1.7 - 7.7 K/uL   Lymphocytes Relative 20 12 - 46 %   Lymphs Abs 2.9 0.7 - 4.0 K/uL   Monocytes Relative 9 3 - 12 %   Monocytes Absolute 1.3 (H) 0.1 - 1.0 K/uL   Eosinophils Relative 2 0 - 5 %   Eosinophils Absolute 0.3 0.0 - 0.7 K/uL   Basophils Relative 0 0 - 1 %   Basophils Absolute 0.0 0.0 - 0.1 K/uL  Comprehensive metabolic panel  Result Value Ref Range   Sodium 136 135 - 145 mmol/L   Potassium 3.8 3.5 - 5.1 mmol/L   Chloride 109 96 - 112 mmol/L   CO2 26 19 - 32 mmol/L   Glucose, Bld 93 70 - 99 mg/dL   BUN 13 6 - 23 mg/dL   Creatinine, Ser 7.82 0.50 - 1.10 mg/dL   Calcium 8.0 (L) 8.4 - 10.5 mg/dL   Total Protein 7.0 6.0 - 8.3 g/dL   Albumin 3.4 (L) 3.5 - 5.2 g/dL   AST 16 0 - 37 U/L   ALT 20 0 - 35 U/L   Alkaline Phosphatase 56 39 - 117 U/L   Total Bilirubin 0.3 0.3 - 1.2 mg/dL   GFR calc non Af Amer >90 >90 mL/min   GFR calc Af Amer >90 >90  mL/min   Anion gap 1 (L) 5 - 15  Magnesium  Result Value Ref Range   Magnesium 1.6 1.5 - 2.5 mg/dL  I-Stat arterial blood gas, ED  Result Value Ref Range   pH, Arterial 7.439 7.350 - 7.450   pCO2 arterial 35.0 35.0 - 45.0 mmHg   pO2, Arterial 63.0 (L) 80.0 - 100.0 mmHg   Bicarbonate 23.6 20.0 - 24.0 mEq/L   TCO2 25 0 - 100 mmol/L   O2 Saturation 92.0 %   Patient temperature 99.0 F    Collection site IV START    Drawn by RT    Sample type ARTERIAL    Dg Chest 2 View  12/10/2014   CLINICAL DATA:  Addition evaluation for shortness of breath. History COPD, asthma, hypertension.  EXAM: CHEST  2 VIEW  COMPARISON:  Prior radiograph from 10/04/2014  FINDINGS: The cardiac and mediastinal silhouettes are stable in size and contour, and remain within normal limits.  The lungs are normally inflated. Diffuse bronchitic changes again seen. No airspace consolidation, pleural effusion, or pulmonary edema is identified. There is no pneumothorax.  No acute osseous abnormality identified.  IMPRESSION: 1. No active cardiopulmonary disease. 2. Chronic bronchitic changes, similar to prior.   Electronically Signed   By: Kathleen Rodriguez M.D.   On: 12/10/2014 01:19   Dg Chest Portable 1 View  12/28/2014   CLINICAL DATA:  Shortness of breath and wheezing for 2 days  EXAM: PORTABLE CHEST - 1 VIEW  COMPARISON:  12/10/2014  FINDINGS: Normal heart size and mediastinal contours. There is stable visible  depression of the left mainstem bronchus. No acute infiltrate or edema. No effusion or pneumothorax. No acute osseous findings.  IMPRESSION: Negative portable chest.   Electronically Signed   By: Kathleen Spring M.D.   On: 12/28/2014 05:10     MDM Reviewed: previous chart, nursing note and vitals Reviewed previous: labs and x-ray Interpretation: labs and x-ray (elevated WBC count, slightly low calcium NACPD on cxr by me) Total time providing critical care: 75-105 minutes. This excludes time spent performing  separately reportable procedures and services. Consults: critical care and admitting MD (Dr. Darrick Penna gave approval for ICU overflow admit to medicine)   Medications  albuterol (PROVENTIL,VENTOLIN) solution continuous neb (15 mg/hr Nebulization New Bag/Given 12/28/14 0446)  famotidine (PEPCID) IVPB 20 mg (not administered)  cefTRIAXone (ROCEPHIN) 1 g in dextrose 5 % 50 mL IVPB (1 g Intravenous New Bag/Given 12/28/14 0638)  azithromycin (ZITHROMAX) 500 mg in dextrose 5 % 250 mL IVPB (not administered)  albuterol (PROVENTIL) (2.5 MG/3ML) 0.083% nebulizer solution 2.5 mg (2.5 mg Nebulization Given 12/28/14 0444)  ipratropium-albuterol (DUONEB) 0.5-2.5 (3) MG/3ML nebulizer solution 3 mL (3 mLs Nebulization Given 12/28/14 0444)  methylPREDNISolone sodium succinate (SOLU-MEDROL) 125 mg/2 mL injection 125 mg (125 mg Intravenous Given 12/28/14 0613)  loratadine (CLARITIN) tablet 10 mg (10 mg Oral Given 12/28/14 0559)  magnesium sulfate IVPB 2 g 50 mL (0 g Intravenous Stopped 12/28/14 0639)  cefTRIAXone (ROCEPHIN) 1 G injection (  Duplicate 12/28/14 1610)   CRITICAL CARE Performed by: Jasmine Awe Total critical care time: 91 minutes Critical care time was exclusive of separately billable procedures and treating other patients. Critical care was necessary to treat or prevent imminent or life-threatening deterioration. Critical care was time spent personally by me on the following activities: development of treatment plan with patient and/or surrogate as well as nursing, discussions with consultants, evaluation of patient's response to treatment, examination of patient, obtaining history from patient or surrogate, ordering and performing treatments and interventions, ordering and review of laboratory studies, ordering and review of radiographic studies, pulse oximetry and re-evaluation of patient's condition.    AC states no beds at this time  Case d/w Dr. Kendrick Fries of P[facility] who agrees patient is appropriate  for step down   Case d/w Dr. Judd Lien  Jasmine Awe, MD 12/28/14 [phone]

## 2014-12-29 DIAGNOSIS — J40 Bronchitis, not specified as acute or chronic: Secondary | ICD-10-CM

## 2014-12-29 DIAGNOSIS — K219 Gastro-esophageal reflux disease without esophagitis: Secondary | ICD-10-CM

## 2014-12-29 DIAGNOSIS — Z9119 Patient's noncompliance with other medical treatment and regimen: Secondary | ICD-10-CM

## 2014-12-29 DIAGNOSIS — N938 Other specified abnormal uterine and vaginal bleeding: Secondary | ICD-10-CM

## 2014-12-29 LAB — INFLUENZA PANEL BY PCR (TYPE A & B)
H1N1FLUPCR: NOT DETECTED
INFLBPCR: NEGATIVE
Influenza A By PCR: NEGATIVE

## 2014-12-29 LAB — BASIC METABOLIC PANEL
Anion gap: 6 (ref 5–15)
BUN: 10 mg/dL (ref 6–23)
CHLORIDE: 106 mmol/L (ref 96–112)
CO2: 25 mmol/L (ref 19–32)
CREATININE: 0.74 mg/dL (ref 0.50–1.10)
Calcium: 8.4 mg/dL (ref 8.4–10.5)
GFR calc Af Amer: >90 mL/min (ref >90)
GFR calc non Af Amer: >90 mL/min (ref >90)
GLUCOSE: 126 mg/dL — AB (ref 70–99)
Potassium: 3.9 mmol/L (ref 3.5–5.1)
Sodium: 137 mmol/L (ref 135–145)

## 2014-12-29 LAB — VITAMIN B12: Vitamin B-12: 463 pg/mL (ref 211–911)

## 2014-12-29 LAB — CBC
HCT: 33.6 % — ABNORMAL LOW (ref 36.0–46.0)
Hemoglobin: 10.9 g/dL — ABNORMAL LOW (ref 12.0–15.0)
MCH: 27.6 pg (ref 26.0–34.0)
MCHC: 32.4 g/dL (ref 30.0–36.0)
MCV: 85.1 fL (ref 78.0–100.0)
PLATELETS: 318 10*3/uL (ref 150–400)
RBC: 3.95 MIL/uL (ref 3.87–5.11)
RDW: 16 % — ABNORMAL HIGH (ref 11.5–15.5)
WBC: 16 10*3/uL — ABNORMAL HIGH (ref 4.0–10.5)

## 2014-12-29 LAB — RETICULOCYTES
RBC.: 3.95 MIL/uL (ref 3.87–5.11)
RETIC CT PCT: 3.5 % — AB (ref 0.4–3.1)
Retic Count, Absolute: 138.3 10*3/uL (ref 19.0–186.0)

## 2014-12-29 LAB — FERRITIN: Ferritin: 14 ng/mL (ref 10–291)

## 2014-12-29 LAB — EXPECTORATED SPUTUM ASSESSMENT W REFEX TO RESP CULTURE: SPECIAL REQUESTS: NORMAL

## 2014-12-29 LAB — IRON AND TIBC
Iron: 18 ug/dL — ABNORMAL LOW (ref 42–145)
Saturation Ratios: 5 % — ABNORMAL LOW (ref 20–55)
TIBC: 351 ug/dL (ref 250–470)
UIBC: 333 ug/dL (ref 125–400)

## 2014-12-29 LAB — GLUCOSE, CAPILLARY
GLUCOSE-CAPILLARY: 128 mg/dL — AB (ref 70–99)
Glucose-Capillary: 116 mg/dL — ABNORMAL HIGH (ref 70–99)

## 2014-12-29 LAB — FOLATE: FOLATE: 13 ng/mL

## 2014-12-29 MED ORDER — ALBUTEROL SULFATE HFA 108 (90 BASE) MCG/ACT IN AERS: 2.0000 | INHALATION_SPRAY | Freq: Four times a day (QID) | RESPIRATORY_TRACT | Status: DC | PRN

## 2014-12-29 MED ORDER — PREDNISONE (PAK) 10 MG PO TABS: ORAL_TABLET | ORAL | Status: DC

## 2014-12-29 MED ORDER — ALBUTEROL SULFATE HFA 108 (90 BASE) MCG/ACT IN AERS: 1.0000 | INHALATION_SPRAY | Freq: Four times a day (QID) | RESPIRATORY_TRACT | Status: DC | PRN

## 2014-12-29 MED ORDER — ALBUTEROL SULFATE (2.5 MG/3ML) 0.083% IN NEBU: 2.5000 mg | INHALATION_SOLUTION | Freq: Four times a day (QID) | RESPIRATORY_TRACT | Status: DC | PRN

## 2014-12-29 MED ORDER — IPRATROPIUM-ALBUTEROL 0.5-2.5 (3) MG/3ML IN SOLN
3.0000 mL | Freq: Four times a day (QID) | RESPIRATORY_TRACT | Status: DC
  Administered 2014-12-29: 3 mL via RESPIRATORY_TRACT
  Filled 2014-12-29 (×2): qty 3

## 2014-12-29 MED ORDER — VITAMIN D (ERGOCALCIFEROL) 1.25 MG (50000 UNIT) PO CAPS: 50000.0000 [IU] | ORAL_CAPSULE | ORAL | Status: DC

## 2014-12-29 MED ORDER — FLUCONAZOLE 150 MG PO TABS
150.0000 mg | ORAL_TABLET | Freq: Every day | ORAL | Status: DC
  Administered 2014-12-29: 150 mg via ORAL
  Filled 2014-12-29 (×2): qty 1

## 2014-12-29 NOTE — Discharge Summary (Addendum)
Physician Discharge Summary  Kathleen Rodriguez ION:[id number] DOB: 05-05-1966 DOA: 12/28/2014  PCP: Kathleen Rodriguez  Admit date: 12/28/2014 Discharge date: 12/29/2014  Time spent: 40 minutes  Recommendations for Outpatient Follow-up:  1. Follow-up with primary care physician within on week. 2. Prescription for albuterol inhaler given  Discharge Diagnoses:  Active Problems:   OSA (obstructive sleep apnea)   Hypertension   Asthma exacerbation   Medically noncompliant   GERD (gastroesophageal reflux disease)   Morbid obesity   COPD (chronic obstructive pulmonary disease)   Tracheobronchitis   Dysfunctional uterine bleeding   Anemia   Discharge Condition: Stable  Diet recommendation: Heart healthy  Filed Weights   12/28/14 1424  Weight: 126.871 kg (279 lb 11.2 oz)    History of present illness:  Kathleen Rodriguez is a 49 y.o. female, history of asthma, hypertension, obstructive sleep apnea, and obesity. Patient presented to [facility] with complaints of sore throat with chest tightness and neck clamminess that began about 2 days ago. She reports that the chest tightness occurred with deep breathing and coughing. The same time she developed a productive cough of thick yellow sputum. She thought she had flu-type symptoms so begin TheraFlu, Robitussin, hot tea and even took some of her husband's leftover prednisone without any improvement in her symptoms. Prior to coming to the ER she noticed she had to sit up on the side of the bed or in a chair to breathe better. She reports that her pulmonologist Dr. Craige Rodriguez had recently discontinued her Symbicort because this was triggering asthma episodes and also discontinued one of her blood pressure medicines (? ACE I). She also states she had run out of her albuterol MDI. She reports her appetite has been good and she's been drinking well. In addition to respiratory symptoms she reports that she thinks she is going through menopause and  she has waxing and waning menstrual cycles. She states she is having her menses right now and typically has very heavy bleeding. It is noted she has underlying anemia.  By the time she presented to the ER she was having significant difficulty breathing and was unable to speak in full sentences. He was given IV steroids, as well as a continuous nebulizer treatment she continued to have significant wheezing. She was given empiric Rocephin and Zithromax to cover for tracheobronchitis. Her chest x-ray was negative for infiltrate or other evidence of pneumonia. She was afebrile at presentation but was quite tachycardic with a pulse of 125, she also had tachypnea with respiratory rate of 28. She was normotensive. She was maintaining O2 saturations around 97% on room air. She was subsequently transferred to [facility] for admission.   Hospital Course:   Asthma exacerbation/COPD /Tracheobronchitis Patient admitted to telemetry initially, empiric antibiotics Rocephin and azithromycin was given. Started on steroids as well as bronchodilators. When I evaluated her this morning she does not have any wheezing, patient probably have pseudo-wheezing. Patient has stridor/wheezing, from upper airways not from lungs, this is resolved with auscultation while she is talking. Patient was doing very okay and ambulated without any problems. No evidence of fever, chills or leukocytosis so antibiotics discontinued. No x-ray findings, flu is negative. Patient given albuterol inhaler prescription, patient should follow-up with primary care physician. If she has further problems, needs pulmonology/ENT referral for evaluation of VCD.   OSA  -Does not utilize CPAP   Hypertension Blood pressure is 130s to 140s, reported that she was on medication was taken off of that.  Likely she was on ACE inhibitor, blood pressure is reasonable, and no indication to start antihypertensive.   Dysfunctional uterine  bleeding/anemia -Continue preadmission iron -Possibly related to onset of menopause but as precaution we'll check transvaginal ultrasound to rule out fibroids or other potential source for DUB -ck anemia panel   Medically noncompliant -Patient continues to smoke.   GERD  -Suspects contributes to some of patient's recurrent asthma symptoms   Morbid obesity. -Suspect has obesity hypoventilation syndrome as well   Procedures:  None  Consultations:  None  Discharge Exam: Filed Vitals:   12/29/14 1238  BP: 144/86  Pulse:   Temp:   Resp:    General: Alert and awake, oriented x3, not in any acute distress. HEENT: anicteric sclera, pupils reactive to light and accommodation, EOMI CVS: S1-S2 clear, no murmur rubs or gallops Chest: clear to auscultation bilaterally, no wheezing, rales or rhonchi Abdomen: soft nontender, nondistended, normal bowel sounds, no organomegaly Extremities: no cyanosis, clubbing or edema noted bilaterally Neuro: Cranial nerves II-XII intact, no focal neurological deficits  Discharge Instructions   Discharge Instructions    Diet - low sodium heart healthy    Complete by:  As directed      Increase activity slowly    Complete by:  As directed           Current Discharge Medication List    START taking these medications   Details  albuterol (PROVENTIL) (2.5 MG/3ML) 0.083% nebulizer solution Take 3 mLs (2.5 mg total) by nebulization every 6 (six) hours as needed for wheezing or shortness of breath. Qty: 75 mL, Refills: 12    predniSONE (STERAPRED UNI-PAK) 10 MG tablet Take 6-5-4-3-2-1 tablet PO daily till gone Qty: 21 tablet, Refills: 0      CONTINUE these medications which have CHANGED   Details  albuterol (VENTOLIN HFA) 108 (90 BASE) MCG/ACT inhaler Inhale 2 puffs into the lungs every 6 (six) hours as needed for wheezing. Qty: 1 Inhaler, Refills: 2    Vitamin D, Ergocalciferol, (DRISDOL) 50000 UNITS CAPS capsule Take 1 capsule (50,000  Units total) by mouth every 7 (seven) days. On thursdays. Qty: 8 capsule, Refills: 0      CONTINUE these medications which have NOT CHANGED   Details  acetaminophen (TYLENOL) 500 MG tablet Take 1,000 mg by mouth every 6 (six) hours as needed for moderate pain or headache (arthiritis).    dextromethorphan (DELSYM) 30 MG/5ML liquid Take 15 mg by mouth daily as needed for cough.    fluconazole (DIFLUCAN) 150 MG tablet Take 1 tablet (150 mg total) by mouth once. Qty: 1 tablet, Refills: 0    fluticasone (FLONASE) 50 MCG/ACT nasal spray Place 2 sprays into the nose daily. Qty: 16 g, Refills: 0    HYDROcodone-acetaminophen (NORCO/VICODIN) 5-325 MG per tablet Take 1 tablet by mouth every 4 (four) hours as needed for moderate pain or severe pain. Qty: 10 tablet, Refills: 0      STOP taking these medications     diltiazem (CARDIZEM CD) 240 MG 24 hr capsule      ferrous fumarate (HEMOCYTE - 106 MG FE) 325 (106 FE) MG TABS        Allergies  Allergen Reactions  . Other Hives and Itching    Ketchup  . Tomato Hives and Itching  . Wool Alcohol [Lanolin] Itching  . Latex Itching and Rash   Follow-up Information    Follow up with Varadarajan, Rupashree In 1 week.   Specialty:  Internal Medicine  Contact information:   [address] [place] [address] [phone]        The results of significant diagnostics from this hospitalization (including imaging, microbiology, ancillary and laboratory) are listed below for reference.    Significant Diagnostic Studies: Dg Chest 2 View  12/10/2014   CLINICAL DATA:  Addition evaluation for shortness of breath. History COPD, asthma, hypertension.  EXAM: CHEST  2 VIEW  COMPARISON:  Prior radiograph from 10/04/2014  FINDINGS: The cardiac and mediastinal silhouettes are stable in size and contour, and remain within normal limits.  The lungs are normally inflated. Diffuse bronchitic changes again seen. No airspace consolidation, pleural  effusion, or pulmonary edema is identified. There is no pneumothorax.  No acute osseous abnormality identified.  IMPRESSION: 1. No active cardiopulmonary disease. 2. Chronic bronchitic changes, similar to prior.   Electronically Signed   By: Rise Mu M.D.   On: 12/10/2014 01:19   US Transvaginal Non-ob  12/28/2014   CLINICAL DATA:  Dysfunctional uterine bleeding  EXAM: TRANSABDOMINAL AND TRANSVAGINAL ULTRASOUND OF PELVIS  TECHNIQUE: Both transabdominal and transvaginal ultrasound examinations of the pelvis were performed. Transabdominal technique was performed for global imaging of the pelvis including uterus, ovaries, adnexal regions, and pelvic cul-de-sac. It was necessary to proceed with endovaginal exam following the transabdominal exam to visualize the .  COMPARISON:  None  FINDINGS: Uterus  Measurements: 7.6 x 5.1 x 7.3 cm. No fibroids or other mass visualized.  Endometrium  Thickness: 7 mm. Small amount of fluid in the uterine cavity. Normal appearance/no adnexal mass.  Right ovary  Measurements: 2.4 x 1.2 x 1.3 cm. Right ovary difficult to visualize due to adjacent bowel gas. Normal appearance/no adnexal mass.  Left ovary  Measurements: 3.1 x 3.3 x 2.7 cm. 2.6 x 2.0 cm cyst left ovary. Normal appearance/no adnexal mass.  Other findings  .  Negative for free fluid.  IMPRESSION: Negative for uterine or endometrial mass. No adnexal mass. Left ovary cyst .   Electronically Signed   By: Marlan Palau M.D.   On: 12/28/2014 19:08   US Pelvis Complete  12/28/2014   CLINICAL DATA:  Dysfunctional uterine bleeding  EXAM: TRANSABDOMINAL AND TRANSVAGINAL ULTRASOUND OF PELVIS  TECHNIQUE: Both transabdominal and transvaginal ultrasound examinations of the pelvis were performed. Transabdominal technique was performed for global imaging of the pelvis including uterus, ovaries, adnexal regions, and pelvic cul-de-sac. It was necessary to proceed with endovaginal exam following the transabdominal exam to  visualize the .  COMPARISON:  None  FINDINGS: Uterus  Measurements: 7.6 x 5.1 x 7.3 cm. No fibroids or other mass visualized.  Endometrium  Thickness: 7 mm. Small amount of fluid in the uterine cavity. Normal appearance/no adnexal mass.  Right ovary  Measurements: 2.4 x 1.2 x 1.3 cm. Right ovary difficult to visualize due to adjacent bowel gas. Normal appearance/no adnexal mass.  Left ovary  Measurements: 3.1 x 3.3 x 2.7 cm. 2.6 x 2.0 cm cyst left ovary. Normal appearance/no adnexal mass.  Other findings  .  Negative for free fluid.  IMPRESSION: Negative for uterine or endometrial mass. No adnexal mass. Left ovary cyst .   Electronically Signed   By: Marlan Palau M.D.   On: 12/28/2014 19:08   Dg Chest Portable 1 View  12/28/2014   CLINICAL DATA:  Shortness of breath and wheezing for 2 days  EXAM: PORTABLE CHEST - 1 VIEW  COMPARISON:  12/10/2014  FINDINGS: Normal heart size and mediastinal contours. There is stable visible depression of  the left mainstem bronchus. No acute infiltrate or edema. No effusion or pneumothorax. No acute osseous findings.  IMPRESSION: Negative portable chest.   Electronically Signed   By: Marnee Spring M.D.   On: 12/28/2014 05:10    Microbiology: Recent Results (from the past 240 hour(s))  Culture, expectorated sputum-assessment     Status: None   Collection Time: 12/29/14  2:07 AM  Result Value Ref Range Status   Specimen Description SPUTUM  Final   Special Requests Normal  Final   Sputum evaluation   Final    THIS SPECIMEN IS ACCEPTABLE. RESPIRATORY CULTURE REPORT TO FOLLOW.   Report Status 12/29/2014 FINAL  Final     Labs: Basic Metabolic Panel:  Recent Labs Lab 12/28/14 0441 12/28/14 1707 12/29/14 0857  NA 136  --  137  K 3.8  --  3.9  CL 109  --  106  CO2 26  --  25  GLUCOSE 93  --  126*  BUN 13  --  10  CREATININE 0.79 0.78 0.74  CALCIUM 8.0*  --  8.4  MG 1.6  --   --    Liver Function Tests:  Recent Labs Lab 12/28/14 0441  AST 16  ALT 20   ALKPHOS 56  BILITOT 0.3  PROT 7.0  ALBUMIN 3.4*   No results for input(s): LIPASE, AMYLASE in the last 168 hours. No results for input(s): AMMONIA in the last 168 hours. CBC:  Recent Labs Lab 12/28/14 0441 12/28/14 1707 12/29/14 0857  WBC 14.9* 12.6* 16.0*  NEUTROABS 10.4*  --   --   HGB 11.5* 10.3* 10.9*  HCT 35.7* 32.4* 33.6*  MCV 85.8 87.1 85.1  PLT 316 286 318   Cardiac Enzymes: No results for input(s): CKTOTAL, CKMB, CKMBINDEX, TROPONINI in the last 168 hours. BNP: BNP (last 3 results) No results for input(s): BNP in the last 8760 hours.  ProBNP (last 3 results)  Recent Labs  03/02/14 1726 04/07/14 0325 07/01/14 1740  PROBNP 32.5 103.4 97.2    CBG:  Recent Labs Lab 12/28/14 2138 12/29/14 0741 12/29/14 1146  GLUCAP 167* 116* 128*       Signed:  Dreyah Montrose A  [facility] 12/29/2014, 1:00 PM

## 2014-12-29 NOTE — Care Management Note (Signed)
    Page 1 of 1   12/29/2014     2:07:08 PM CARE MANAGEMENT NOTE 12/29/2014  Patient:  Atlee AbideJONES,Rusti   Account Number:  [id number]  Date Initiated:  12/29/2014  Documentation initiated by:  GRAVES-BIGELOW,Markala Sitts  Subjective/Objective Assessment:   Pt admitted for asthma exacerbation. Pt is without insurance and referral received for medication assistance.     Action/Plan:   CM did provide pt with information to the [facility]. Pt can call for Hospital f/u. Albuterol inhaler changed to nebulizer and she will be able to purchase at [facility] for $4.00. No other needs identified at this time.   Anticipated DC Date:  12/29/2014   Anticipated DC Plan:  HOME/SELF CARE      DC Planning Services  CM consult      Choice offered to / List presented to:             Status of service:  Completed, signed off Medicare Important Message given?  NO (If response is "NO", the following Medicare IM given date fields will be blank) Date Medicare IM given:   Medicare IM given by:   Date Additional Medicare IM given:   Additional Medicare IM given by:    Discharge Disposition:  HOME/SELF CARE  Per UR Regulation:  Reviewed for med. necessity/level of care/duration of stay  If discussed at Long Length of Stay Meetings, dates discussed:    Comments:

## 2014-12-30 ENCOUNTER — Telehealth: Payer: Self-pay | Admitting: Pulmonary Disease

## 2014-12-30 NOTE — Telephone Encounter (Signed)
Pt scheduled for OV with TP 01/01/15 at 9am  Pt wanting to discuss meds since being out of hospital.  Discuss getting a handicapped placard Discuss rescue medicine.  Pt having a hard time affording Ventolin.  This is the only inhaler that her insurance will cover but the co-pay is too high.  Will send to Jess as FYI. Nothing further needed.

## 2014-12-31 LAB — CULTURE, RESPIRATORY: CULTURE: NORMAL

## 2014-12-31 LAB — CULTURE, RESPIRATORY W GRAM STAIN

## 2015-01-01 ENCOUNTER — Ambulatory Visit: Payer: Self-pay | Admitting: Adult Health

## 2015-01-01 NOTE — Telephone Encounter (Signed)
Pt noshowed for pt No new appts pending

## 2015-03-16 ENCOUNTER — Telehealth: Payer: Self-pay | Admitting: Pulmonary Disease

## 2015-03-16 NOTE — Telephone Encounter (Signed)
It is not clear to me what is currently going on with her health that would require her to have an extension for her court date.  She would need to have evaluation in office first before a letter can be written.  Also, would need to have specifics about how her eviction notice and court date are related to her health condition.  She could also discuss with her PCP to determine if they can provide a letter.

## 2015-03-16 NOTE — Telephone Encounter (Signed)
Called and spoke to pt. Pt stated she is being evicted from her home and is suppose to appear in court on 5/24. Pt is requesting a letter from [facility] stated she has a health condition that is cause for an extension on her eviction date. Pt will need this letter during her court appearance on 5/24. Pt last seen on 04/2014.  VS please advise.

## 2015-03-16 NOTE — Telephone Encounter (Signed)
Pt returned call. Informed her of the recs per [facility]. Pt verbalized understanding and stated she will call back to schedule an OV with VS. Nothing further needed at this time.

## 2015-03-16 NOTE — Telephone Encounter (Signed)
lmtcb X1 for pt to make aware.  

## 2015-07-24 ENCOUNTER — Emergency Department (HOSPITAL_COMMUNITY): Payer: Self-pay

## 2015-07-24 ENCOUNTER — Telehealth: Payer: Self-pay | Admitting: Pulmonary Disease

## 2015-07-24 ENCOUNTER — Inpatient Hospital Stay (HOSPITAL_COMMUNITY)
Admission: EM | Admit: 2015-07-24 | Discharge: 2015-07-27 | DRG: 189 | Disposition: A | Discharge status: Home / Self Care | Payer: Self-pay | Attending: Family Medicine | Admitting: Family Medicine

## 2015-07-24 ENCOUNTER — Encounter (HOSPITAL_COMMUNITY): Payer: Self-pay | Admitting: Emergency Medicine

## 2015-07-24 DIAGNOSIS — E876 Hypokalemia: Secondary | ICD-10-CM | POA: Diagnosis present

## 2015-07-24 DIAGNOSIS — E662 Morbid (severe) obesity with alveolar hypoventilation: Secondary | ICD-10-CM | POA: Diagnosis present

## 2015-07-24 DIAGNOSIS — K219 Gastro-esophageal reflux disease without esophagitis: Secondary | ICD-10-CM | POA: Diagnosis present

## 2015-07-24 DIAGNOSIS — Z87891 Personal history of nicotine dependence: Secondary | ICD-10-CM

## 2015-07-24 DIAGNOSIS — Z830 Family history of human immunodeficiency virus [HIV] disease: Secondary | ICD-10-CM

## 2015-07-24 DIAGNOSIS — I10 Essential (primary) hypertension: Secondary | ICD-10-CM | POA: Diagnosis present

## 2015-07-24 DIAGNOSIS — R000 Tachycardia, unspecified: Secondary | ICD-10-CM

## 2015-07-24 DIAGNOSIS — Z8249 Family history of ischemic heart disease and other diseases of the circulatory system: Secondary | ICD-10-CM

## 2015-07-24 DIAGNOSIS — Z91018 Allergy to other foods: Secondary | ICD-10-CM

## 2015-07-24 DIAGNOSIS — Z91040 Latex allergy status: Secondary | ICD-10-CM

## 2015-07-24 DIAGNOSIS — E872 Acidosis: Secondary | ICD-10-CM | POA: Diagnosis present

## 2015-07-24 DIAGNOSIS — J9602 Acute respiratory failure with hypercapnia: Principal | ICD-10-CM | POA: Diagnosis present

## 2015-07-24 DIAGNOSIS — J45901 Unspecified asthma with (acute) exacerbation: Secondary | ICD-10-CM

## 2015-07-24 DIAGNOSIS — R739 Hyperglycemia, unspecified: Secondary | ICD-10-CM | POA: Diagnosis present

## 2015-07-24 DIAGNOSIS — T380X5A Adverse effect of glucocorticoids and synthetic analogues, initial encounter: Secondary | ICD-10-CM | POA: Diagnosis present

## 2015-07-24 DIAGNOSIS — I471 Supraventricular tachycardia: Secondary | ICD-10-CM

## 2015-07-24 DIAGNOSIS — M1990 Unspecified osteoarthritis, unspecified site: Secondary | ICD-10-CM | POA: Diagnosis present

## 2015-07-24 DIAGNOSIS — Z6841 Body Mass Index (BMI) 40.0 and over, adult: Secondary | ICD-10-CM

## 2015-07-24 DIAGNOSIS — G4733 Obstructive sleep apnea (adult) (pediatric): Secondary | ICD-10-CM | POA: Diagnosis present

## 2015-07-24 LAB — CBC WITH DIFFERENTIAL/PLATELET
BASOS PCT: 0 %
Basophils Absolute: 0 10*3/uL (ref 0.0–0.1)
Eosinophils Absolute: 0.3 10*3/uL (ref 0.0–0.7)
Eosinophils Relative: 3 %
HEMATOCRIT: 41.1 % (ref 36.0–46.0)
HEMOGLOBIN: 13.1 g/dL (ref 12.0–15.0)
LYMPHS PCT: 39 %
Lymphs Abs: 3.7 10*3/uL (ref 0.7–4.0)
MCH: 27.7 pg (ref 26.0–34.0)
MCHC: 31.9 g/dL (ref 30.0–36.0)
MCV: 86.9 fL (ref 78.0–100.0)
MONOS PCT: 6 %
Monocytes Absolute: 0.5 10*3/uL (ref 0.1–1.0)
NEUTROS ABS: 4.9 10*3/uL (ref 1.7–7.7)
Neutrophils Relative %: 52 %
Platelets: 257 10*3/uL (ref 150–400)
RBC: 4.73 MIL/uL (ref 3.87–5.11)
RDW: 16.3 % — ABNORMAL HIGH (ref 11.5–15.5)
WBC: 9.5 10*3/uL (ref 4.0–10.5)

## 2015-07-24 LAB — BASIC METABOLIC PANEL
ANION GAP: 8 (ref 5–15)
BUN: 9 mg/dL (ref 6–20)
CALCIUM: 8.5 mg/dL — AB (ref 8.9–10.3)
CO2: 24 mmol/L (ref 22–32)
Chloride: 103 mmol/L (ref 101–111)
Creatinine, Ser: 0.88 mg/dL (ref 0.44–1.00)
GFR calc non Af Amer: >60 mL/min (ref >60)
Glucose, Bld: 147 mg/dL — ABNORMAL HIGH (ref 65–99)
POTASSIUM: 3.6 mmol/L (ref 3.5–5.1)
Sodium: 135 mmol/L (ref 135–145)

## 2015-07-24 LAB — I-STAT TROPONIN, ED: Troponin i, poc: 0 ng/mL (ref 0.00–0.08)

## 2015-07-24 LAB — I-STAT VENOUS BLOOD GAS, ED
Acid-base deficit: 2 mmol/L (ref 0.0–2.0)
BICARBONATE: 25.6 meq/L — AB (ref 20.0–24.0)
O2 Saturation: 67 %
PH VEN: 7.289 (ref 7.250–7.300)
PO2 VEN: 40 mmHg (ref 30.0–45.0)
TCO2: 27 mmol/L (ref 0–100)
pCO2, Ven: 53.3 mmHg — ABNORMAL HIGH (ref 45.0–50.0)

## 2015-07-24 MED ORDER — ALBUTEROL SULFATE (2.5 MG/3ML) 0.083% IN NEBU
5.0000 mg | INHALATION_SOLUTION | Freq: Once | RESPIRATORY_TRACT | Status: AC
  Administered 2015-07-24: 5 mg via RESPIRATORY_TRACT
  Filled 2015-07-24: qty 6

## 2015-07-24 MED ORDER — ONDANSETRON HCL 4 MG PO TABS: 4.0000 mg | ORAL_TABLET | Freq: Four times a day (QID) | ORAL | Status: DC | PRN

## 2015-07-24 MED ORDER — ENOXAPARIN SODIUM 40 MG/0.4ML ~~LOC~~ SOLN
40.0000 mg | Freq: Every day | SUBCUTANEOUS | Status: DC
  Administered 2015-07-25 – 2015-07-26 (×2): 40 mg via SUBCUTANEOUS
  Filled 2015-07-24 (×2): qty 0.4

## 2015-07-24 MED ORDER — ONDANSETRON HCL 4 MG/2ML IJ SOLN: 4.0000 mg | Freq: Four times a day (QID) | INTRAMUSCULAR | Status: DC | PRN

## 2015-07-24 MED ORDER — ALUM & MAG HYDROXIDE-SIMETH 200-200-20 MG/5ML PO SUSP: 30.0000 mL | Freq: Four times a day (QID) | ORAL | Status: DC | PRN

## 2015-07-24 MED ORDER — OXYCODONE HCL 5 MG PO TABS
5.0000 mg | ORAL_TABLET | ORAL | Status: DC | PRN
  Administered 2015-07-26: 5 mg via ORAL
  Filled 2015-07-24: qty 1

## 2015-07-24 MED ORDER — HYDROMORPHONE HCL 1 MG/ML IJ SOLN
0.5000 mg | INTRAMUSCULAR | Status: DC | PRN
  Administered 2015-07-26: 1 mg via INTRAVENOUS
  Filled 2015-07-24: qty 1

## 2015-07-24 MED ORDER — PANTOPRAZOLE SODIUM 40 MG IV SOLR
40.0000 mg | INTRAVENOUS | Status: DC
  Administered 2015-07-25: 40 mg via INTRAVENOUS
  Filled 2015-07-24: qty 40

## 2015-07-24 MED ORDER — IPRATROPIUM BROMIDE 0.02 % IN SOLN
0.5000 mg | Freq: Once | RESPIRATORY_TRACT | Status: AC
  Administered 2015-07-24: 0.5 mg via RESPIRATORY_TRACT
  Filled 2015-07-24: qty 2.5

## 2015-07-24 MED ORDER — LIDOCAINE HCL (CARDIAC) 20 MG/ML IV SOLN
INTRAVENOUS | Status: AC
  Filled 2015-07-24: qty 5

## 2015-07-24 MED ORDER — SODIUM CHLORIDE 0.9 % IJ SOLN
3.0000 mL | Freq: Two times a day (BID) | INTRAMUSCULAR | Status: DC
  Administered 2015-07-25 (×2): 3 mL via INTRAVENOUS

## 2015-07-24 MED ORDER — ACETAMINOPHEN 650 MG RE SUPP: 650.0000 mg | Freq: Four times a day (QID) | RECTAL | Status: DC | PRN

## 2015-07-24 MED ORDER — SUCCINYLCHOLINE CHLORIDE 20 MG/ML IJ SOLN
INTRAMUSCULAR | Status: AC
  Filled 2015-07-24: qty 1

## 2015-07-24 MED ORDER — SODIUM CHLORIDE 0.9 % IV BOLUS (SEPSIS)
500.0000 mL | Freq: Once | INTRAVENOUS | Status: AC
  Administered 2015-07-24: 500 mL via INTRAVENOUS

## 2015-07-24 MED ORDER — IPRATROPIUM-ALBUTEROL 0.5-2.5 (3) MG/3ML IN SOLN: 3.0000 mL | Freq: Four times a day (QID) | RESPIRATORY_TRACT | Status: DC

## 2015-07-24 MED ORDER — SODIUM CHLORIDE 0.9 % IV SOLN: 250.0000 mL | INTRAVENOUS | Status: DC | PRN

## 2015-07-24 MED ORDER — ROCURONIUM BROMIDE 50 MG/5ML IV SOLN
INTRAVENOUS | Status: AC
  Filled 2015-07-24: qty 2

## 2015-07-24 MED ORDER — MAGNESIUM SULFATE IN D5W 10-5 MG/ML-% IV SOLN
1.0000 g | Freq: Once | INTRAVENOUS | Status: AC
  Administered 2015-07-24: 1 g via INTRAVENOUS
  Filled 2015-07-24: qty 100

## 2015-07-24 MED ORDER — ETOMIDATE 2 MG/ML IV SOLN
INTRAVENOUS | Status: AC
  Filled 2015-07-24: qty 20

## 2015-07-24 MED ORDER — ALBUTEROL (5 MG/ML) CONTINUOUS INHALATION SOLN
10.0000 mg/h | INHALATION_SOLUTION | RESPIRATORY_TRACT | Status: DC
  Administered 2015-07-24: 10 mg/h via RESPIRATORY_TRACT
  Filled 2015-07-24: qty 20

## 2015-07-24 MED ORDER — SODIUM CHLORIDE 0.9 % IJ SOLN: 3.0000 mL | INTRAMUSCULAR | Status: DC | PRN

## 2015-07-24 MED ORDER — TERBUTALINE SULFATE 1 MG/ML IJ SOLN: 0.2500 mg | Freq: Once | INTRAMUSCULAR | Status: DC

## 2015-07-24 MED ORDER — ACETAMINOPHEN 325 MG PO TABS
650.0000 mg | ORAL_TABLET | Freq: Four times a day (QID) | ORAL | Status: DC | PRN
  Administered 2015-07-25: 650 mg via ORAL
  Filled 2015-07-24: qty 2

## 2015-07-24 NOTE — Consult Note (Signed)
PULMONARY  / CRITICAL CARE MEDICINE CONSULTATION   Name: Kathleen Rodriguez MRN: [id number] DOB: 1966/09/09    ADMISSION DATE:  07/24/2015 CONSULTATION DATE: July 24, 2015  REQUESTING CLINICIAN: Tilden Fossa, MD PRIMARY SERVICE: ED/[facility]   CHIEF COMPLAINT:  Dyspnea  BRIEF PATIENT DESCRIPTION: 49 y/o woman with obesity hypoventilation syndrome as well as asthma who presents with asthma exacerbation and acute respiratory acidosis.  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 She reports that for the past three days she has had worsening chest tightness as well as more vague symptoms suggestive of menopause. She does have CPAP at home which she has not been using the last few days as she had difficulty wearing the mask while dyspneic. She presented to the ED where she was found to have a mild acute respiratory acidosis. PCCM was called for reccomendations for further management.   PAST MEDICAL HISTORY :  Past Medical History  Diagnosis Date  . Arthritis   . Asthma   . Hypertension   . Influenza B April 2014    Complicated by multi-organ failure  . 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 OSA (obstructive sleep apnea) 03/20/2013   Past Surgical History  Procedure Laterality Date  . Tubal ligation    . Cesarean section     Prior to Admission medications   Medication Sig Start Date End Date Taking? Authorizing Provider  albuterol (PROVENTIL) (2.5 MG/3ML) 0.083% nebulizer solution Take 3 mLs (2.5 mg total) by nebulization every 6 (six) hours as needed for wheezing or shortness of breath. 12/29/14  Yes Clydia Llano, MD  albuterol (VENTOLIN HFA) 108 (90 BASE) MCG/ACT inhaler Inhale 2 puffs into  the lungs every 6 (six) hours as needed for wheezing. 12/29/14  Yes Clydia Llano, MD  fluticasone (FLONASE) 50 MCG/ACT nasal spray Place 2 sprays into the nose daily. 02/21/13  Yes Bernadene Person, NP  Vitamin D, Ergocalciferol, (DRISDOL) 50000 UNITS CAPS capsule Take 1 capsule (50,000 Units total) by mouth every 7 (seven) days. On thursdays. 12/29/14  Yes Clydia Llano, MD  fluconazole (DIFLUCAN) 150 MG tablet Take 1 tablet (150 mg total) by mouth once. Patient not taking: Reported on 07/24/2015 09/23/14   Nelva Nay, MD  HYDROcodone-acetaminophen (NORCO/VICODIN) 5-325 MG per tablet Take 1 tablet by mouth every 4 (four) hours as needed for moderate pain or severe pain. Patient not taking: Reported on 07/24/2015 09/23/14   Nelva Nay, MD  predniSONE (STERAPRED UNI-PAK) 10 MG tablet Take 6-5-4-3-2-1 tablet PO daily till gone Patient not taking: Reported on 07/24/2015 12/29/14   Clydia Llano, MD   Allergies  Allergen Reactions  . Other Hives and Itching    Ketchup  . Tomato Hives and Itching  . Wool Alcohol [Lanolin] Itching  . Latex Itching and Rash    FAMILY HISTORY:  Family History  Problem Relation Age of Onset  . Hypertension Mother   . HIV/AIDS Father    SOCIAL HISTORY:  reports that she quit smoking about 2 years ago. Her smoking use included Cigarettes. She has a 5 pack-year smoking history. She has never used smokeless tobacco. She reports that she does not drink alcohol or use illicit drugs.  REVIEW  OF SYSTEMS:  Per HPI  SUBJECTIVE:   VITAL SIGNS: Temp:  [98.4 F (36.9 C)] 98.4 F (36.9 C) (09/30 1838) Pulse Rate:  [94-132] 126 (09/30 2230) Resp:  [15-27] 20 (09/30 2230) BP: (133-159)/(50-84) 149/79 mmHg (09/30 2230) SpO2:  [89 %-100 %] 92 % (09/30 2230) Weight:  [280 lb (127.007 kg)] 280 lb (127.007 kg) (09/30 1838) HEMODYNAMICS:   VENTILATOR SETTINGS:   INTAKE / OUTPUT: Intake/Output    None     PHYSICAL EXAMINATION: General:  Obese woman in NAD Neuro:   Awake, alert HEENT:  MMM Neck: No LAD Cardiovascular:  Tachycardic Lungs:  Diffuse end-expiratory wheezing, normal respiratory effort Abdomen:  Obese Musculoskeletal:  No swollen joints Skin:  No rashes  LABS:  CBC  Recent Labs Lab 07/24/15 1926  WBC 9.5  HGB 13.1  HCT 41.1  PLT 257   Coag's No results for input(s): APTT, INR in the last 168 hours. BMET  Recent Labs Lab 07/24/15 1926  NA 135  K 3.6  CL 103  CO2 24  BUN 9  CREATININE 0.88  GLUCOSE 147*   Electrolytes  Recent Labs Lab 07/24/15 1926  CALCIUM 8.5*   Sepsis Markers No results for input(s): LATICACIDVEN, PROCALCITON, O2SATVEN in the last 168 hours. ABG No results for input(s): PHART, PCO2ART, PO2ART in the last 168 hours. Liver Enzymes No results for input(s): AST, ALT, ALKPHOS, BILITOT, ALBUMIN in the last 168 hours. Cardiac Enzymes No results for input(s): TROPONINI, PROBNP in the last 168 hours. Glucose No results for input(s): GLUCAP in the last 168 hours.  Imaging Dg Chest Port 1 View  07/24/2015   CLINICAL DATA:  Acute onset of shortness of breath. Initial encounter.  EXAM: PORTABLE CHEST 1 VIEW  COMPARISON:  Chest radiograph performed 12/28/2014  FINDINGS: The lungs are well-aerated and clear. There is no evidence of focal opacification, pleural effusion or pneumothorax.  The cardiomediastinal silhouette is within normal limits. No acute osseous abnormalities are seen.  IMPRESSION: No acute cardiopulmonary process seen.   Electronically Signed   By: Roanna Raider M.D.   On: 07/24/2015 19:42    EKG: Sinus tachycardia CXR: Clear lungs  ASSESSMENT / PLAN:  Acute Asthma Exacerbation S/p multiple doses of bronchodialators (albuterol, ipratroprium). Given sinus tachycardia, would avoid additional nonspecific beta agonists such as albuterol or terbutaline, and would recommend xopenex (levalbuterol) q4 hrs. Ipratropium may be given q4 hrs as well, and would give these alternating. - She is  s/p 125 mg of solumedrol by EMS. - Continue steroids tomorrow, 40 mg of oral prednisone is reasonable. - s/p magnesium - Will need re-initiation of ICS/LABA on discharge  Acute on chronic respiratory Acidosis BiPAP now (15/5). Recommend f/u gas in 1-2 hrs. Call PCCM if not improved. Will need BiPAP at night.   TODAY'S SUMMARY: 49 y/o woman with obesity hypoventilation syndrome with acute exacerbation of respiratory acidosis and asthma.  I have personally obtained a history, examined the patient, evaluated laboratory and imaging results, formulated the assessment and plan and placed orders.  CRITICAL CARE: The patient is critically ill with multiple organ systems failure and requires high complexity decision making for assessment and support, frequent evaluation and titration of therapies, application of advanced monitoring technologies and extensive interpretation of multiple databases. Critical Care Time devoted to patient care services described in this note is 40 minutes.   Jamie Kato, MD Pulmonary and Critical Care Medicine [facility] Pager: [phone]   07/24/2015, 10:59 PM

## 2015-07-24 NOTE — ED Notes (Signed)
Per EMS- pt here w hx of asthma. Out of her at home breathing treatments for her neb. Pt has lost her insurance so she has to come to the ED for her treatments. Pt would like assistance getting meds. Pt has had asthma exacerbation for four days, worse today. Given 125 solumedrol IV, 1 neb and 1 duo neb. Pt still wheezing audible. 18G PIV placed to [facility], EMS reports it is positional.

## 2015-07-24 NOTE — ED Notes (Signed)
Phlebotomy at bedside.

## 2015-07-24 NOTE — ED Notes (Signed)
Pt requesting L knee xray d/t pain x 3 days; pt reports hx of arthritis

## 2015-07-24 NOTE — Telephone Encounter (Signed)
Called spoke with pt. Made aware we have no albuterol samples. Nothing further needed

## 2015-07-24 NOTE — ED Notes (Signed)
Pt had an argument with husband on her ED room and requested that her husband don't have any access to see her on the hospital, pt is very anxious, her vitals sign increased due to anxiety.

## 2015-07-24 NOTE — ED Provider Notes (Signed)
CSN: [id number]     Arrival date & time 07/24/15  1824 History   First MD Initiated Contact with Patient 07/24/15 1827     Chief Complaint  Patient presents with  . Asthma     Patient is a 49 y.o. female presenting with asthma. The history is provided by the patient. No language interpreter was used.  Asthma   Kathleen Rodriguez presents for evaluation of shortness of breath. She has a history of asthma and reports 3 days of increased chest tightness and shortness of breath. He denies any fevers but has hot flashes at times. Her cough is nonproductive. Prior to ED arrival she received Solu-Medrol and a DuoNeb by EMS. She denies any lower extremity edema. She has a history of prior ICU admission with intubation a year ago for asthma exacerbation. Sxs are moderate, constant, worsening.   Past Medical History  Diagnosis Date  . Arthritis   . Asthma   . Hypertension   . Influenza B April 2014    Complicated by multi-organ failure  . 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 OSA (obstructive sleep apnea) 03/20/2013   Past Surgical History  Procedure Laterality Date  . Tubal ligation    . Cesarean section     Family History  Problem Relation Age of Onset  . Hypertension Mother   . HIV/AIDS Father    Social History  Substance Use Topics  . Smoking status: Former Smoker -- 0.25 packs/day for 20 years    Types: Cigarettes    Quit date: 01/31/2013  . Smokeless tobacco: Never Used  . Alcohol Use: No   OB History    No data available     Review of Systems  All other systems reviewed and are negative.     Allergies  Other; Tomato; Wool alcohol; and Latex  Home Medications   Prior to Admission medications   Medication Sig Start Date End Date Taking? Authorizing Provider  acetaminophen (TYLENOL) 500 MG tablet Take 1,000 mg by mouth every 6 (six) hours as needed for moderate pain or headache (arthiritis).    Historical Provider, MD  albuterol (PROVENTIL) (2.5 MG/3ML) 0.083% nebulizer  solution Take 3 mLs (2.5 mg total) by nebulization every 6 (six) hours as needed for wheezing or shortness of breath. 12/29/14   Clydia Llano, MD  albuterol (VENTOLIN HFA) 108 (90 BASE) MCG/ACT inhaler Inhale 2 puffs into the lungs every 6 (six) hours as needed for wheezing. 12/29/14   Clydia Llano, MD  dextromethorphan (DELSYM) 30 MG/5ML liquid Take 15 mg by mouth daily as needed for cough.    Historical Provider, MD  fluconazole (DIFLUCAN) 150 MG tablet Take 1 tablet (150 mg total) by mouth once. 09/23/14   Nelva Nay, MD  fluticasone (FLONASE) 50 MCG/ACT nasal spray Place 2 sprays into the nose daily. 02/21/13   Bernadene Person, NP  HYDROcodone-acetaminophen (NORCO/VICODIN) 5-325 MG per tablet Take 1 tablet by mouth every 4 (four) hours as needed for moderate pain or severe pain. 09/23/14   Nelva Nay, MD  predniSONE (STERAPRED UNI-PAK) 10 MG tablet Take 6-5-4-3-2-1 tablet PO daily till gone 12/29/14   Clydia Llano, MD  Vitamin D, Ergocalciferol, (DRISDOL) 50000 UNITS CAPS capsule Take 1 capsule (50,000 Units total) by mouth every 7 (seven) days. On thursdays. 12/29/14   Clydia Llano, MD   BP 153/77 mmHg  Pulse 102  Temp(Src) 98.4 F (36.9 C)  Resp 17  Ht  (1.676 m)  Wt 280 lb (127.007 kg)  BMI  45.21 kg/m2  SpO2 98% Physical Exam  Constitutional: She is oriented to person, place, and time. She appears well-developed and well-nourished.  HENT:  Head: Normocephalic and atraumatic.  Cardiovascular: Normal rate and regular rhythm.   No murmur heard. Pulmonary/Chest: She is in respiratory distress. She has wheezes.  Decreased air movement bilaterally. Tachypnea.  Abdominal: Soft. There is no tenderness. There is no rebound and no guarding.  Musculoskeletal: She exhibits no edema or tenderness.  Neurological: She is alert and oriented to person, place, and time.  Skin: Skin is warm and dry.  Psychiatric: She has a normal mood and affect. Her behavior is normal.  Nursing note and  vitals reviewed.   ED Course  Procedures  CRITICAL CARE Performed by: Tilden Fossa   Total critical care time: 30 minutes  Critical care time was exclusive of separately billable procedures and treating other patients.  Critical care was necessary to treat or prevent imminent or life-threatening deterioration.  Critical care was time spent personally by me on the following activities: development of treatment plan with patient and/or surrogate as well as nursing, discussions with consultants, evaluation of patient's response to treatment, examination of patient, obtaining history from patient or surrogate, ordering and performing treatments and interventions, ordering and review of laboratory studies, ordering and review of radiographic studies, pulse oximetry and re-evaluation of patient's condition.  Labs Review Labs Reviewed  BASIC METABOLIC PANEL - Abnormal; Notable for the following:    Glucose, Bld 147 (*)    Calcium 8.5 (*)    All other components within normal limits  CBC WITH DIFFERENTIAL/PLATELET - Abnormal; Notable for the following:    RDW 16.3 (*)    All other components within normal limits  I-STAT VENOUS BLOOD GAS, ED - Abnormal; Notable for the following:    pCO2, Ven 53.3 (*)    Bicarbonate 25.6 (*)    All other components within normal limits  BASIC METABOLIC PANEL  CBC  I-STAT TROPOININ, ED    Imaging Review Dg Chest Port 1 View  07/24/2015   CLINICAL DATA:  Acute onset of shortness of breath. Initial encounter.  EXAM: PORTABLE CHEST 1 VIEW  COMPARISON:  Chest radiograph performed 12/28/2014  FINDINGS: The lungs are well-aerated and clear. There is no evidence of focal opacification, pleural effusion or pneumothorax.  The cardiomediastinal silhouette is within normal limits. No acute osseous abnormalities are seen.  IMPRESSION: No acute cardiopulmonary process seen.   Electronically Signed   By: Roanna Raider M.D.   On: 07/24/2015 19:42   I have  personally reviewed and evaluated these images and lab results as part of my medical decision-making.   EKG Interpretation   Date/Time:  Friday July 24 2015 18:38:11 EDT Ventricular Rate:  104 PR Interval:  123 QRS Duration: 90 QT Interval:  333 QTC Calculation: 438 R Axis:   74 Text Interpretation:  Sinus tachycardia Probable left atrial enlargement  Borderline repolarization abnormality Confirmed by Lincoln Brigham [phone]) on  07/24/2015 8:54:06 PM      MDM   Final diagnoses:  None    Pt with hx/o asthma here with acute exacerbation.  She had mild improvement after hour long neb with improved air movement bilaterally, diffuse end expiratory wheezing.  Providing magnesium, additional neb.  Plan to admit to medicine, stepdown for further management.   On repeat eval pt with increased tachypnea, SOB, fair air movement bilaterally, plan to start bipap for additional support.      Tilden Fossa, MD 07/25/15 [phone]

## 2015-07-24 NOTE — ED Notes (Signed)
Port xray at bedside.  

## 2015-07-24 NOTE — H&P (Signed)
[facility] Admission History and Physical       Jennell Janosik ZOX:[id number] DOB: 1965-10-26 DOA: 07/24/2015  Referring physician: EDP PCP: No PCP Per Patient  Specialists:   Chief Complaint: SOB and Wheezing  HPI: Kathleen Rodriguez is a 49 y.o. female with history of Asthma, HTN, OSA who presents to the ED with complaints of worsening SOB and Wheezing over the past 3 days.  She has had cough but has been unable to produce Sputum, and she denies any fevers or chills.  She was administered 2 Nebulizer treatments and IV Solumedrol on route to the ED, and in the ED she was administered a continuous Nebulizer treatment and IV Magnesium and had mild improvement.  She was tachycardic, and tachypneic, and VBG results were a ph of 7.23 and a pCO2 of 52, she was then placed on BiPAP and PCCM was Consulted.   She was admitted to a Stepdown Bed.      Review of Systems:  Constitutional: No Weight Loss, No Weight Gain, Night Sweats, Fevers, Chills, Dizziness, Light Headedness, Fatigue, or Generalized Weakness HEENT: No Headaches, Difficulty Swallowing,Tooth/Dental Problems,Sore Throat,  No Sneezing, Rhinitis, Ear Ache, Nasal Congestion, or Post Nasal Drip,  Cardio-vascular:  No Chest pain, Orthopnea, PND, Edema in Lower Extremities, Anasarca, Dizziness, Palpitations  Resp: +Dyspnea, No DOE, No Productive Cough, +Non-Productive Cough, No Hemoptysis, + Wheezing.    GI: No Heartburn, Indigestion, Abdominal Pain, Nausea, Vomiting, Diarrhea, Constipation, Hematemesis, Hematochezia, Melena, Change in Bowel Habits,  Loss of Appetite  GU: No Dysuria, No Change in Color of Urine, No Urgency or Urinary Frequency, No Flank pain.  Musculoskeletal: No Joint Pain or Swelling, No Decreased Range of Motion, No Back Pain.  Neurologic: No Syncope, No Seizures, Muscle Weakness, Paresthesia, Vision Disturbance or Loss, No Diplopia, No Vertigo, No Difficulty Walking,  Skin: No Rash or Lesions. Psych: No Change in Mood  or Affect, No Depression or Anxiety, No Memory loss, No Confusion, or Hallucinations   Past Medical History  Diagnosis Date  . Arthritis   . Asthma   . Hypertension   . Influenza B April 2014    Complicated by multi-organ failure  . 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 OSA (obstructive sleep apnea) 03/20/2013     Past Surgical History  Procedure Laterality Date  . Tubal ligation    . Cesarean section        Prior to Admission medications   Medication Sig Start Date End Date Taking? Authorizing Provider  albuterol (PROVENTIL) (2.5 MG/3ML) 0.083% nebulizer solution Take 3 mLs (2.5 mg total) by nebulization every 6 (six) hours as needed for wheezing or shortness of breath. 12/29/14  Yes Clydia Llano, MD  albuterol (VENTOLIN HFA) 108 (90 BASE) MCG/ACT inhaler Inhale 2 puffs into the lungs every 6 (six) hours as needed for wheezing. 12/29/14  Yes Clydia Llano, MD  fluticasone (FLONASE) 50 MCG/ACT nasal spray Place 2 sprays into the nose daily. 02/21/13  Yes Bernadene Person, NP  Vitamin D, Ergocalciferol, (DRISDOL) 50000 UNITS CAPS capsule Take 1 capsule (50,000 Units total) by mouth every 7 (seven) days. On thursdays. 12/29/14  Yes Clydia Llano, MD  fluconazole (DIFLUCAN) 150 MG tablet Take 1 tablet (150 mg total) by mouth once. Patient not taking: Reported on 07/24/2015 09/23/14   Nelva Nay, MD  HYDROcodone-acetaminophen (NORCO/VICODIN) 5-325 MG per tablet Take 1 tablet by mouth every 4 (four) hours as needed for moderate pain or severe pain. Patient not taking: Reported on 07/24/2015 09/23/14   Molly Maduro  Radford Pax, MD  predniSONE (STERAPRED UNI-PAK) 10 MG tablet Take 6-5-4-3-2-1 tablet PO daily till gone Patient not taking: Reported on 07/24/2015 12/29/14   Clydia Llano, MD     Allergies  Allergen Reactions  . Other Hives and Itching    Ketchup  . Tomato Hives and Itching  . Wool Alcohol [Lanolin] Itching  . Latex Itching and Rash    Social History:  reports that she quit smoking  about 2 years ago. Her smoking use included Cigarettes. She has a 5 pack-year smoking history. She has never used smokeless tobacco. She reports that she does not drink alcohol or use illicit drugs.    Family History  Problem Relation Age of Onset  . Hypertension Mother   . HIV/AIDS Father        Physical Exam:  GEN:  Pleasant Obese 49 y.o. African American female examined and in moderate distress; cooperative with exam Filed Vitals:   07/24/15 2100 07/24/15 2115 07/24/15 2145 07/24/15 2230  BP: 136/61 136/62 133/61 149/79  Pulse: 128 115 132 126  Temp:      Resp: Height:      Weight:      SpO2: 89% 90% 93% 92%   Blood pressure 149/79, pulse 126, temperature 98.4 F (36.9 C), resp. rate 20, height  (1.676 m), weight 127.007 kg (280 lb), SpO2 92 %. PSYCH: She is alert and oriented x4; does not appear anxious does not appear depressed; affect is normal HEENT: Normocephalic and Atraumatic, Mucous membranes pink; PERRLA; EOM intact; Fundi:  Benign;  No scleral icterus, Nares: Patent, Oropharynx: Clear, Fair Dentition,    Neck:  FROM, No Cervical Lymphadenopathy nor Thyromegaly or Carotid Bruit; No JVD; Breasts:: Not examined CHEST WALL: No tenderness CHEST:  Decreased Breath Sounds, Few Scatter Wheezes  HEART: Tachycardic Regular rhythm; no murmurs rubs or gallops BACK: No kyphosis or scoliosis; No CVA tenderness ABDOMEN: Positive Bowel Sounds, Obese, Soft Non-Tender, No Rebound or Guarding; No Masses, No Organomegaly Rectal Exam: Not done EXTREMITIES: No  Cyanosis, Clubbing, or Edema; No Ulcerations. Genitalia: not examined PULSES: 2+ and symmetric SKIN: Normal hydration no rash or ulceration CNS:  Alert and Oriented x 4, No Focal Deficits Vascular: pulses palpable throughout    Labs on Admission:  Basic Metabolic Panel:  Recent Labs Lab 07/24/15 1926  NA 135  K 3.6  CL 103  CO2 24  GLUCOSE 147*  BUN 9  CREATININE 0.88  CALCIUM 8.5*   Liver  Function Tests: No results for input(s): AST, ALT, ALKPHOS, BILITOT, PROT, ALBUMIN in the last 168 hours. No results for input(s): LIPASE, AMYLASE in the last 168 hours. No results for input(s): AMMONIA in the last 168 hours. CBC:  Recent Labs Lab 07/24/15 1926  WBC 9.5  NEUTROABS 4.9  HGB 13.1  HCT 41.1  MCV 86.9  PLT 257   Cardiac Enzymes: No results for input(s): CKTOTAL, CKMB, CKMBINDEX, TROPONINI in the last 168 hours.  BNP (last 3 results) No results for input(s): BNP in the last 8760 hours.  ProBNP (last 3 results) No results for input(s): PROBNP in the last 8760 hours.  CBG: No results for input(s): GLUCAP in the last 168 hours.  Radiological Exams on Admission: Dg Chest Port 1 View  07/24/2015   CLINICAL DATA:  Acute onset of shortness of breath. Initial encounter.  EXAM: PORTABLE CHEST 1 VIEW  COMPARISON:  Chest radiograph performed 12/28/2014  FINDINGS: The lungs are well-aerated and clear. There is no  evidence of focal opacification, pleural effusion or pneumothorax.  The cardiomediastinal silhouette is within normal limits. No acute osseous abnormalities are seen.  IMPRESSION: No acute cardiopulmonary process seen.   Electronically Signed   By: Roanna Raider M.D.   On: 07/24/2015 19:42     EKG: Independently reviewed. Sinus Tachycardia rate = 104, No Acute S-T Changes    Assessment/Plan:      49 y.o. female with  Active Problems:   1.    Asthma exacerbation   DUONebs   IV Steroid Taper   O2       2.    Acute respiratory failure with hypercapnia   BiPAP/O2   Monitor O2 sats     3.    Sinus tachycardia (HCC)-due to #1   Cardiac Monitoring        4.    Essential hypertension   PRN IV Hydralazine   Monitor BPs     5.    DVT Prophylaxis   Lovenox    Code Status:     FULL CODE    Family Communication:   No Family Present    Disposition Plan:    Inpatient Status        Time spent: 68 Minutes      Ron Parker [facility] Pager [phone]   If 7AM -7PM Please Contact the Day Rounding Team MD for [facility]  If 7PM-7AM, Please Contact Night-Floor Coverage  [email or web address] Password [email or web address] 07/24/2015, 10:55 PM     ADDENDUM:   Patient was seen and examined on 07/24/2015

## 2015-07-25 ENCOUNTER — Encounter (HOSPITAL_COMMUNITY): Payer: Self-pay

## 2015-07-25 DIAGNOSIS — J9602 Acute respiratory failure with hypercapnia: Principal | ICD-10-CM

## 2015-07-25 DIAGNOSIS — I10 Essential (primary) hypertension: Secondary | ICD-10-CM | POA: Diagnosis present

## 2015-07-25 DIAGNOSIS — R000 Tachycardia, unspecified: Secondary | ICD-10-CM

## 2015-07-25 DIAGNOSIS — J45901 Unspecified asthma with (acute) exacerbation: Secondary | ICD-10-CM

## 2015-07-25 LAB — BASIC METABOLIC PANEL
Anion gap: 12 (ref 5–15)
BUN: 10 mg/dL (ref 6–20)
CALCIUM: 8.6 mg/dL — AB (ref 8.9–10.3)
CO2: 19 mmol/L — AB (ref 22–32)
CREATININE: 0.93 mg/dL (ref 0.44–1.00)
Chloride: 106 mmol/L (ref 101–111)
GFR calc non Af Amer: >60 mL/min (ref >60)
Glucose, Bld: 201 mg/dL — ABNORMAL HIGH (ref 65–99)
Potassium: 3 mmol/L — ABNORMAL LOW (ref 3.5–5.1)
SODIUM: 137 mmol/L (ref 135–145)

## 2015-07-25 LAB — CBC
HCT: 39.8 % (ref 36.0–46.0)
Hemoglobin: 12.9 g/dL (ref 12.0–15.0)
MCH: 28.1 pg (ref 26.0–34.0)
MCHC: 32.4 g/dL (ref 30.0–36.0)
MCV: 86.7 fL (ref 78.0–100.0)
PLATELETS: 275 10*3/uL (ref 150–400)
RBC: 4.59 MIL/uL (ref 3.87–5.11)
RDW: 16.4 % — AB (ref 11.5–15.5)
WBC: 10.3 10*3/uL (ref 4.0–10.5)

## 2015-07-25 LAB — MRSA PCR SCREENING: MRSA BY PCR: NEGATIVE

## 2015-07-25 MED ORDER — METHYLPREDNISOLONE SODIUM SUCC 40 MG IJ SOLR
40.0000 mg | Freq: Three times a day (TID) | INTRAMUSCULAR | Status: DC
  Administered 2015-07-25 – 2015-07-26 (×4): 40 mg via INTRAVENOUS
  Filled 2015-07-25 (×4): qty 1

## 2015-07-25 MED ORDER — POTASSIUM CHLORIDE CRYS ER 20 MEQ PO TBCR
40.0000 meq | EXTENDED_RELEASE_TABLET | ORAL | Status: AC
  Administered 2015-07-25 (×2): 40 meq via ORAL
  Filled 2015-07-25 (×2): qty 2

## 2015-07-25 MED ORDER — ALBUTEROL SULFATE (2.5 MG/3ML) 0.083% IN NEBU: 2.5000 mg | INHALATION_SOLUTION | RESPIRATORY_TRACT | Status: DC | PRN

## 2015-07-25 MED ORDER — FLUTICASONE PROPIONATE 50 MCG/ACT NA SUSP
2.0000 | Freq: Every day | NASAL | Status: DC
  Administered 2015-07-25 – 2015-07-27 (×3): 2 via NASAL
  Filled 2015-07-25: qty 16

## 2015-07-25 MED ORDER — METHYLPREDNISOLONE SODIUM SUCC 125 MG IJ SOLR: 125.0000 mg | Freq: Once | INTRAMUSCULAR | Status: DC

## 2015-07-25 MED ORDER — METHYLPREDNISOLONE SODIUM SUCC 125 MG IJ SOLR
125.0000 mg | Freq: Once | INTRAMUSCULAR | Status: AC
  Administered 2015-07-25: 125 mg via INTRAVENOUS
  Filled 2015-07-25: qty 2

## 2015-07-25 MED ORDER — SALINE SPRAY 0.65 % NA SOLN
2.0000 | Freq: Two times a day (BID) | NASAL | Status: DC
  Administered 2015-07-25 – 2015-07-27 (×5): 2 via NASAL
  Filled 2015-07-25: qty 44

## 2015-07-25 MED ORDER — ALBUTEROL SULFATE (2.5 MG/3ML) 0.083% IN NEBU
2.5000 mg | INHALATION_SOLUTION | Freq: Four times a day (QID) | RESPIRATORY_TRACT | Status: AC
  Administered 2015-07-25 – 2015-07-26 (×4): 2.5 mg via RESPIRATORY_TRACT
  Filled 2015-07-25 (×4): qty 3

## 2015-07-25 MED ORDER — PANTOPRAZOLE SODIUM 40 MG PO TBEC
40.0000 mg | DELAYED_RELEASE_TABLET | Freq: Every day | ORAL | Status: DC
  Administered 2015-07-25 – 2015-07-26 (×2): 40 mg via ORAL
  Filled 2015-07-25 (×2): qty 1

## 2015-07-25 MED ORDER — BUDESONIDE 0.5 MG/2ML IN SUSP
0.5000 mg | Freq: Two times a day (BID) | RESPIRATORY_TRACT | Status: DC
  Administered 2015-07-25 – 2015-07-26 (×3): 0.5 mg via RESPIRATORY_TRACT
  Filled 2015-07-25 (×4): qty 2

## 2015-07-25 MED ORDER — ARFORMOTEROL TARTRATE 15 MCG/2ML IN NEBU
15.0000 ug | INHALATION_SOLUTION | Freq: Two times a day (BID) | RESPIRATORY_TRACT | Status: DC
  Administered 2015-07-25: 15 ug via RESPIRATORY_TRACT
  Filled 2015-07-25 (×7): qty 2

## 2015-07-25 MED ORDER — IPRATROPIUM-ALBUTEROL 0.5-2.5 (3) MG/3ML IN SOLN
3.0000 mL | RESPIRATORY_TRACT | Status: DC
  Administered 2015-07-25 (×2): 3 mL via RESPIRATORY_TRACT
  Filled 2015-07-25 (×2): qty 3

## 2015-07-25 NOTE — Progress Notes (Signed)
eLink Physician-Brief Progress Note Patient Name: Lyna Laningham DOB: [birth year] MRN: [id number]   Date of Service  07/25/2015  HPI/Events of Note  Hypokalemia  eICU Interventions  Potassium replaced     Intervention Category Intermediate Interventions: Electrolyte abnormality - evaluation and management  Shonette Rhames 07/25/2015, 4:05 AM

## 2015-07-25 NOTE — Progress Notes (Signed)
PCCM PROGRESS NOTE  ADMISSION DATE: 07/24/2015 CONSULT DATE: 07/24/2015 REFERRING PROVIDER: [facility]  CC: Short of breath  SUBJECTIVE: Still feels tight in chest, but breathing better.  C/o dryness in nose.  OBJECTIVE: Temp:  [97.9 F (36.6 C)-98.4 F (36.9 C)] 97.9 F (36.6 C) (10/01 0405) Pulse Rate:  [94-132] 112 (10/01 0600) Resp:  [15-27] 19 (10/01 0600) BP: (111-160)/(50-96) 125/74 mmHg (10/01 0600) SpO2:  [88 %-100 %] 92 % (10/01 0600) FiO2 (%):  [40 %-50 %] 40 % (10/01 0120) Weight:  [264 lb 8.8 oz (120 kg)-280 lb (127.007 kg)] 264 lb 8.8 oz (120 kg) (09/30 2345)   General: pleasant HEENT: clear nasal drainage Cardiac: regular Chest: no wheeze Abd: soft, non tender Ext: no edema Neuro: normal strength Skin: no rashes   CMP Latest Ref Rng 07/25/2015 07/24/2015 12/29/2014  Glucose 65 - 99 mg/dL 161(W) 960(A) 540(J)  BUN 6 - 20 mg/dL Creatinine 0.44 - 1.00 mg/dL 8.11 9.14 7.82  Sodium 135 - 145 mmol/L 137 135 137  Potassium 3.5 - 5.1 mmol/L 3.0(L) 3.6 3.9  Chloride 101 - 111 mmol/L 106 103 106  CO2 22 - 32 mmol/L 19(L) 24 25  Calcium 8.9 - 10.3 mg/dL 9.5(A) 2.1(H) 8.4  Total Protein 6.0 - 8.3 g/dL - - -  Total Bilirubin 0.3 - 1.2 mg/dL - - -  Alkaline Phos 39 - 117 U/L - - -  AST 0 - 37 U/L - - -  ALT 0 - 35 U/L - - -     CBC Latest Ref Rng 07/25/2015 07/24/2015 12/29/2014  WBC 4.0 - 10.5 K/uL 10.3 9.5 16.0(H)  Hemoglobin 12.0 - 15.0 g/dL 08.6 57.8 10.9(L)  Hematocrit 36.0 - 46.0 % 39.8 41.1 33.6(L)  Platelets 150 - 400 K/uL 275 257 318     Dg Chest Port 1 View  07/24/2015   CLINICAL DATA:  Acute onset of shortness of breath. Initial encounter.  EXAM: PORTABLE CHEST 1 VIEW  COMPARISON:  Chest radiograph performed 12/28/2014  FINDINGS: The lungs are well-aerated and clear. There is no evidence of focal opacification, pleural effusion or pneumothorax.  The cardiomediastinal silhouette is within normal limits. No acute osseous abnormalities are seen.   IMPRESSION: No acute cardiopulmonary process seen.   Electronically Signed   By: Roanna Raider M.D.   On: 07/24/2015 19:42    EVENTS: 9/30 Admit, started on BiPAP  DISCUSSION: 49 yo female former smoker presents with progressive dyspnea, chest tightness from asthma exacerbation after cleaning house with bleach solution.  She has hx of OSA, upper airway cough, VCD, GERD.  Previously followed by Dr. Craige Cotta in pulmonary office.  She reports difficulty affording medications.  ASSESSMENT/PLAN:  Acute asthma exacerbation. Plan: - d/c BiPAP - add brovana, pulmicort - scheduled albuterol for 10/01 >> then prn - solumedrol 40 mg q8h  Upper airway cough syndrome. Plan: - nasal saline spray, flonase  GERD. Plan: - protonix  OSA. Plan: - CPAP qhs  Will need social worker to assist with arranging for insurance coverage.  Okay to transfer to non tele medical floor bed >> defer to [facility].  [name], MD [facility] Pulmonary/Critical Care 07/25/2015, 7:50 AM Pager:  [phone] After 3pm call: [phone]

## 2015-07-25 NOTE — Progress Notes (Signed)
[facility] TEAM 1 - Stepdown/ICU TEAM PROGRESS NOTE  Kathleen Rodriguez JXB:[id number] DOB: 1965/12/15 DOA: 07/24/2015 PCP: No PCP Per Patient  Admit HPI / Brief Narrative: 49 y.o. female with history of Asthma, HTN, OSA who presented to the ED with complaints of worsening SOB w/ wheezing over 3 days. She had cough but was unable to produce sputum, and she denied fevers or chills. She was administered 2 Nebulizer treatments and IV Solumedrol on route to the ED, and in the ED she was administered a continuous Nebulizer treatment and IV Magnesium and had mild improvement. She was tachycardic, and tachypneic, and VBG noting pH 7.23 and a pCO2 of 52.  She was placed on BiPAP and PCCM was consulted.   HPI/Subjective: The patient states she is feeling better but is not yet back to her baseline.  She denies chest pain nausea vomiting or abdominal pain.  She is still somewhat short of breath but not nearly as short of breath as she was at the time of presentation.  Assessment/Plan:  Asthma w/ acute exacerbation - acute resp failure w/ hypercapnea  Cont systemic steroids - now off BIPAP - continue scheduled and when necessary nebs  Upper airway cough syndrome Per Pulm recs   Hypokalemia  Replace and follow trend   Hyperglycemia Steroid induced - check A1c to r/o occult DM   GERD Cont PPI   OSA CPAP QHS   HTN BP reasonably controlled presently - follow w/o change today  Obesity - Body mass index is 42.72 kg/(m^2).  Code Status: FULL Family Communication: no family present at time of exam Disposition Plan: transfer to med bed - follow pulm exam - CM to assist as able w/ med acquisition   Consultants: PCCM  Procedures: none  Antibiotics: none  DVT prophylaxis: lovenox   Objective: Blood pressure 140/80, pulse 108, temperature 98.4 F (36.9 C), temperature source Oral, resp. rate 17, height  (1.676 m), weight 120 kg (264 lb 8.8 oz), SpO2 94 %.  Intake/Output Summary  (Last 24 hours) at 07/25/15 1040 Last data filed at 07/25/15 0700  Gross per 24 hour  Intake      0 ml  Output    300 ml  Net   -300 ml   Exam: General: No acute respiratory distress at rest in bed  Lungs: Distant breath sounds throughout - mild expiratory wheeze - no focal crackles Cardiovascular: Regular rate and rhythm without murmur gallop or rub normal S1 and S2 Abdomen: Nontender, nondistended, soft, bowel sounds positive, no rebound, no ascites, no appreciable mass Extremities: No significant cyanosis, clubbing, or edema bilateral lower extremities  Data Reviewed: Basic Metabolic Panel:  Recent Labs Lab 07/24/15 1926 07/25/15 0300  NA 135 137  K 3.6 3.0*  CL 103 106  CO2 24 19*  GLUCOSE 147* 201*  BUN 9 10  CREATININE 0.88 0.93  CALCIUM 8.5* 8.6*    CBC:  Recent Labs Lab 07/24/15 1926 07/25/15 0300  WBC 9.5 10.3  NEUTROABS 4.9  --   HGB 13.1 12.9  HCT 41.1 39.8  MCV 86.9 86.7  PLT 257 275    Liver Function Tests: No results for input(s): AST, ALT, ALKPHOS, BILITOT, PROT, ALBUMIN in the last 168 hours. No results for input(s): LIPASE, AMYLASE in the last 168 hours. No results for input(s): AMMONIA in the last 168 hours.  CBG: No results for input(s): GLUCAP in the last 168 hours.  Recent Results (from the past 240 hour(s))  MRSA PCR Screening  Status: None   Collection Time: 07/25/15  2:35 AM  Result Value Ref Range Status   MRSA by PCR NEGATIVE NEGATIVE Final    Comment:        The GeneXpert MRSA Assay (FDA approved for NASAL specimens only), is one component of a comprehensive MRSA colonization surveillance program. It is not intended to diagnose MRSA infection nor to guide or monitor treatment for MRSA infections.      Studies:   Recent x-ray studies have been reviewed in detail by the Attending Physician  Scheduled Meds:  Scheduled Meds: . albuterol  2.5 mg Nebulization Q6H  . arformoterol  15 mcg Nebulization BID  .  budesonide (PULMICORT) nebulizer solution  0.5 mg Nebulization BID  . enoxaparin (LOVENOX) injection  40 mg Subcutaneous Daily  . fluticasone  2 spray Each Nare Daily  . methylPREDNISolone (SOLU-MEDROL) injection  40 mg Intravenous 3 times per day  . pantoprazole  40 mg Oral Q1200  . sodium chloride  2 spray Each Nare BID  . sodium chloride  3 mL Intravenous Q12H    Time spent on care of this patient: 35 mins   MCCLUNG,JEFFREY T , MD   [facility] Office  [phone] Pager - Text Page per Loretha Stapler as per below:  On-Call/Text Page:      [email or web address]      password [email or web address]  If 7PM-7AM, please contact night-coverage [email or web address] Password [email or web address] 07/25/2015, 10:40 AM   LOS: 1 day

## 2015-07-26 LAB — BASIC METABOLIC PANEL
Anion gap: 10 (ref 5–15)
BUN: 11 mg/dL (ref 6–20)
CALCIUM: 9.1 mg/dL (ref 8.9–10.3)
CO2: 23 mmol/L (ref 22–32)
Chloride: 103 mmol/L (ref 101–111)
Creatinine, Ser: 0.82 mg/dL (ref 0.44–1.00)
GFR calc Af Amer: >60 mL/min (ref >60)
Glucose, Bld: 86 mg/dL (ref 65–99)
POTASSIUM: 4.3 mmol/L (ref 3.5–5.1)
SODIUM: 136 mmol/L (ref 135–145)

## 2015-07-26 LAB — MAGNESIUM: Magnesium: 2 mg/dL (ref 1.7–2.4)

## 2015-07-26 MED ORDER — POTASSIUM CHLORIDE CRYS ER 20 MEQ PO TBCR
40.0000 meq | EXTENDED_RELEASE_TABLET | ORAL | Status: AC
  Administered 2015-07-26 (×2): 40 meq via ORAL
  Filled 2015-07-26 (×2): qty 2

## 2015-07-26 MED ORDER — PREDNISONE 20 MG PO TABS
40.0000 mg | ORAL_TABLET | Freq: Every day | ORAL | Status: DC
  Administered 2015-07-27: 40 mg via ORAL
  Filled 2015-07-26: qty 2

## 2015-07-26 MED ORDER — INFLUENZA VAC SPLIT QUAD 0.5 ML IM SUSY
0.5000 mL | PREFILLED_SYRINGE | INTRAMUSCULAR | Status: AC
  Administered 2015-07-27: 0.5 mL via INTRAMUSCULAR
  Filled 2015-07-26: qty 0.5

## 2015-07-26 NOTE — Progress Notes (Signed)
PCCM PROGRESS NOTE  ADMISSION DATE: 07/24/2015 CONSULT DATE: 07/24/2015 REFERRING PROVIDER: [facility]  CC: Short of breath  SUBJECTIVE: Still feels tight in chest, but breathing better.  CO muscle spasms- about to get potassium supplement  OBJECTIVE: Temp:  [97.4 F (36.3 C)-98.4 F (36.9 C)] 98.1 F (36.7 C) (10/02 0601) Pulse Rate:  [89-122] 89 (10/02 0601) Resp:  [17-22] 20 (10/02 0601) BP: (129-175)/(51-85) 133/64 mmHg (10/02 0601) SpO2:  [91 %-100 %] 94 % (10/02 0601)   General: pleasant HEENT: clear nasal drainage Cardiac: regular, no murmur Chest: unlabored, mild end-exp wheeze L anterior chest Abd: soft, non tender Ext: no edema Neuro: normal strength Skin: no rashes   CMP Latest Ref Rng 07/26/2015 07/25/2015 07/24/2015  Glucose 65 - 99 mg/dL 86 161(W) 960(A)  BUN 6 - 20 mg/dL Creatinine 0.44 - 1.00 mg/dL 5.40 9.81 1.91  Sodium 135 - 145 mmol/L 136 137 135  Potassium 3.5 - 5.1 mmol/L 4.3 3.0(L) 3.6  Chloride 101 - 111 mmol/L 103 106 103  CO2 22 - 32 mmol/L 23 19(L) 24  Calcium 8.9 - 10.3 mg/dL 9.1 4.7(W) 2.9(F)  Total Protein 6.0 - 8.3 g/dL - - -  Total Bilirubin 0.3 - 1.2 mg/dL - - -  Alkaline Phos 39 - 117 U/L - - -  AST 0 - 37 U/L - - -  ALT 0 - 35 U/L - - -     CBC Latest Ref Rng 07/25/2015 07/24/2015 12/29/2014  WBC 4.0 - 10.5 K/uL 10.3 9.5 16.0(H)  Hemoglobin 12.0 - 15.0 g/dL 62.1 30.8 10.9(L)  Hematocrit 36.0 - 46.0 % 39.8 41.1 33.6(L)  Platelets 150 - 400 K/uL 275 257 318     Dg Chest Port 1 View  07/24/2015   CLINICAL DATA:  Acute onset of shortness of breath. Initial encounter.  EXAM: PORTABLE CHEST 1 VIEW  COMPARISON:  Chest radiograph performed 12/28/2014  FINDINGS: The lungs are well-aerated and clear. There is no evidence of focal opacification, pleural effusion or pneumothorax.  The cardiomediastinal silhouette is within normal limits. No acute osseous abnormalities are seen.  IMPRESSION: No acute cardiopulmonary process seen.    Electronically Signed   By: Roanna Raider M.D.   On: 07/24/2015 19:42    EVENTS: 9/30 Admit, started on BiPAP  DISCUSSION: 49 yo female former smoker presents with progressive dyspnea, chest tightness from asthma exacerbation after cleaning house with bleach solution and sitting on porch in rainy weather.  She has hx of OSA, upper airway cough, VCD, GERD.  Previously followed by Dr. Craige Cotta in pulmonary office.  She reports difficulty affording medications.  ASSESSMENT/PLAN:  Acute asthma exacerbation. Plan: - d/cd BiPAP - added brovana, pulmicort - scheduled albuterol for 10/01 >> then prn - solumedrol 40 mg q8h  Upper airway cough syndrome.  Plan: - nasal saline spray, flonase  GERD. Plan: - protonix  OSA. Plan: - CPAP qhs  Will need social worker to assist with arranging for insurance coverage.  CD Maple Hudson, MD [facility] Pulmonary/Critical Care 07/26/2015, 8:17 AM Pager:  [phone] After 3pm call: [phone]

## 2015-07-26 NOTE — Progress Notes (Signed)
Kathleen Rodriguez ZOX:[id number] DOB: [birth year] DOA: 07/24/2015 PCP: No PCP Per Patient   49 y.o. female with history of  Asthma,  HTN,  Dysfunctional uterine bleed smoker vocal chord dysfunction/upper airway syndromes and stridor fromt his Admit 07/25/15  complaints of worsening SOB w/ wheezing over 3 days  . She had cough but was unable to produce sputum, and she denied fevers or chills.  She was administered 2 Nebulizer treatments and IV Solumedrol on route to the ED, and in the ED she was administered a continuous Nebulizer treatment and IV Magnesium and had mild improvement.  She was tachycardic, and tachypneic, and VBG noting pH 7.23 and a pCO2 of 52.   She was placed on BiPAP and PCCM was consulted.   HPI/Subjective:   Assessment/Plan:  Asthma w/ acute exacerbation - acute resp failure w/ hypercapnea  change systemic steroids -->po prednisone 40 daily - now off BIPAP  - continue scheduled and when necessary nebs -clarified with Dr young if affordability is an issue hold Brovana and pulmicort and monotherapy with Albuterol  Upper airway cough syndrome Per Pulm recs   Hypokalemia  Replace and follow trend   Hyperglycemia Steroid induced - check A1c to r/o occult DM   GERD Cont PPI   hypokalemia Replace with potassium Kdur orally 40 meq  OSA CPAP QHS   HTN BP reasonably controlled presently  - follow w/o change today -previously on Cardizem which she doesn't seem to need  Obesity - Body mass index is 42.72 kg/(m^2).  Code Status: FULL Family Communication: no family present at time of exam Disposition Plan: transfer to med bed - follow pulm exam - CM to assist as able w/ med acquisition   Consultants: PCCM  Procedures: none  Antibiotics: none  DVT prophylaxis: lovenox   Objective: Blood pressure 133/64, pulse 89, temperature 98.1 F (36.7 C), temperature source Oral, resp. rate 20, height  (1.676 m), weight 120 kg (264 lb 8.8 oz),  SpO2 94 %.  Intake/Output Summary (Last 24 hours) at 07/26/15 0848 Last data filed at 07/26/15 0500  Gross per 24 hour  Intake    720 ml  Output      0 ml  Net    720 ml   Exam: General: No acute respiratory distress at rest in bed  Lungs: Distant breath sounds throughout - no wheeze Cardiovascular: Regular rate and rhythm without murmur gallop or rub normal S1 and S2 Abdomen: Nontender, nondistended, soft, bowel sounds positive, no rebound, no ascites, no appreciable mass Extremities: No significant cyanosis, clubbing, or edema bilateral lower extremities  Data Reviewed: Basic Metabolic Panel:  Recent Labs Lab 07/24/15 1926 07/25/15 0300 07/26/15 0533  NA 135 137 136  K 3.6 3.0* 4.3  CL 103 106 103  CO2 24 19* 23  GLUCOSE 147* 201* 86  BUN CREATININE 0.88 0.93 0.82  CALCIUM 8.5* 8.6* 9.1  MG  --   --  2.0    CBC:  Recent Labs Lab 07/24/15 1926 07/25/15 0300  WBC 9.5 10.3  NEUTROABS 4.9  --   HGB 13.1 12.9  HCT 41.1 39.8  MCV 86.9 86.7  PLT 257 275    Liver Function Tests: No results for input(s): AST, ALT, ALKPHOS, BILITOT, PROT, ALBUMIN in the last 168 hours. No results for input(s): LIPASE, AMYLASE in the last 168 hours. No results for input(s): AMMONIA in the last 168 hours.  CBG: No results for input(s): GLUCAP in the last 168  hours.  Recent Results (from the past 240 hour(s))  MRSA PCR Screening     Status: None   Collection Time: 07/25/15  2:35 AM  Result Value Ref Range Status   MRSA by PCR NEGATIVE NEGATIVE Final    Comment:        The GeneXpert MRSA Assay (FDA approved for NASAL specimens only), is one component of a comprehensive MRSA colonization surveillance program. It is not intended to diagnose MRSA infection nor to guide or monitor treatment for MRSA infections.      Studies:   Recent x-ray studies have been reviewed in detail by the Attending Physician  Scheduled Meds:  Scheduled Meds: . arformoterol  15 mcg  Nebulization BID  . budesonide (PULMICORT) nebulizer solution  0.5 mg Nebulization BID  . enoxaparin (LOVENOX) injection  40 mg Subcutaneous Daily  . fluticasone  2 spray Each Nare Daily  . [START ON 07/27/2015] Influenza vac split quadrivalent PF  0.5 mL Intramuscular Tomorrow-1000  . methylPREDNISolone (SOLU-MEDROL) injection  40 mg Intravenous 3 times per day  . pantoprazole  40 mg Oral Q1200  . potassium chloride  40 mEq Oral Q4H  . sodium chloride  2 spray Each Nare BID    Time spent on care of this patient: 35 mins   Rhetta Mura , MD   [facility] Office  [phone] Pager - Text Page per Loretha Stapler as per below:  On-Call/Text Page:      [email or web address]      password [email or web address]  If 7PM-7AM, please contact night-coverage [email or web address] Password [email or web address] 07/26/2015, 8:48 AM   LOS: 2 days

## 2015-07-27 ENCOUNTER — Telehealth: Payer: Self-pay | Admitting: Pulmonary Disease

## 2015-07-27 LAB — HEMOGLOBIN A1C
Hgb A1c MFr Bld: 5.8 % — ABNORMAL HIGH (ref 4.8–5.6)
MEAN PLASMA GLUCOSE: 120 mg/dL

## 2015-07-27 MED ORDER — ALBUTEROL SULFATE HFA 108 (90 BASE) MCG/ACT IN AERS: 2.0000 | INHALATION_SPRAY | Freq: Four times a day (QID) | RESPIRATORY_TRACT | Status: DC | PRN

## 2015-07-27 MED ORDER — FLUCONAZOLE 150 MG PO TABS: 150.0000 mg | ORAL_TABLET | Freq: Once | ORAL | Status: DC

## 2015-07-27 MED ORDER — HYDROCODONE-ACETAMINOPHEN 5-325 MG PO TABS: 1.0000 | ORAL_TABLET | ORAL | Status: DC | PRN

## 2015-07-27 MED ORDER — PREDNISONE 20 MG PO TABS: 40.0000 mg | ORAL_TABLET | Freq: Every day | ORAL | Status: DC

## 2015-07-27 MED ORDER — ALBUTEROL SULFATE (2.5 MG/3ML) 0.083% IN NEBU: 2.5000 mg | INHALATION_SOLUTION | RESPIRATORY_TRACT | Status: DC | PRN

## 2015-07-27 NOTE — Care Management Note (Signed)
Case Management Note  Patient Details  Name: Kathleen Rodriguez MRN: [id number] Date of Birth: [birth year]  Subjective/Objective:                    Action/Plan:  Explained MATCH letter , patinet voiced understanding aware pain medication is not covered . Provided patient with [facility] information, patient states she takes her sister there . Patient called [facility] for appointment as NCM was leaving room.  Expected Discharge Date:                  Expected Discharge Plan:  Home/Self Care  In-House Referral:     Discharge planning Services  CM Consult, Indigent [facility] Clinic, [facility], Medication Assistance  Post Acute Care Choice:    Choice offered to:     DME Arranged:    DME Agency:     HH Arranged:    HH Agency:     Status of Service:  Completed, signed off  Medicare Important Message Given:    Date Medicare IM Given:    Medicare IM give by:    Date Additional Medicare IM Given:    Additional Medicare Important Message give by:     If discussed at Long Length of Stay Meetings, dates discussed:    Additional Comments:  Kingsley Plan, RN 07/27/2015, 9:54 AM

## 2015-07-27 NOTE — Discharge Summary (Signed)
Physician Discharge Summary  Kathleen Rodriguez ZOX:[id number] DOB: 09-25-1966 DOA: 07/24/2015  PCP: No PCP Per Patient  Admit date: 07/24/2015 Discharge date: 07/27/2015  Time spent: 15 minutes  Recommendations for Outpatient Follow-up:  comply with nebs and Rx Need OP pulmonary follow up  Follow a1c Needs 3 days of steroid burst then stop  Discharge Diagnoses:  Active Problems:   Asthma exacerbation   Acute respiratory failure with hypercapnia (HCC)   Essential hypertension   Sinus tachycardia (HCC)   Discharge Condition: fair  Diet recommendation: hh low salt  Filed Weights   07/24/15 1838 07/24/15 2345  Weight: 127.007 kg (280 lb) 120 kg (264 lb 8.8 oz)    History of present illness:  49 y.o. female with history of Asthma,  HTN,  Dysfunctional uterine bleed Former smoker vocal chord dysfunction/upper airway syndromes and stridor fromt his Admit 07/25/15 complaints of worsening SOB w/ wheezing over 3 days  . She had cough but was unable to produce sputum, and she denied fevers or chills. She was administered 2 Nebulizer treatments and IV Solumedrol on route to the ED, and in the ED she was administered a continuous Nebulizer treatment and IV Magnesium and had mild improvement.  She was tachycardic, and tachypneic, and VBG noting pH 7.23 and a pCO2 of 52.  She was placed on BiPAP and PCCM was consulted.    She eventually did very well was transitioned off of iv steroids and pulmonary recommended mainly nebs with albuterol and prednisone as affordability of meds remianed an issue  She was stable for d/c home 07/27/15  Discharge Exam: Filed Vitals:   07/27/15 0635  BP: 92/66  Pulse: 87  Temp: 98 F (36.7 C)  Resp: 18    General: alert pleasant ready to go Cardiovascular: s1 s2 no m/r/g Respiratory: clear no wheeze  Discharge Instructions   Discharge Instructions    Diet - low sodium heart healthy    Complete by:  As directed      Discharge  instructions    Complete by:  As directed   Take vicodin only as needed We have Rx some nebulizer solution for you     Increase activity slowly    Complete by:  As directed           Current Discharge Medication List    START taking these medications   Details  predniSONE (DELTASONE) 20 MG tablet Take 2 tablets (40 mg total) by mouth daily before breakfast. Qty: 6 tablet, Refills: 0      CONTINUE these medications which have CHANGED   Details  albuterol (PROVENTIL) (2.5 MG/3ML) 0.083% nebulizer solution Take 3 mLs (2.5 mg total) by nebulization every 2 (two) hours as needed for wheezing or shortness of breath. Qty: 75 mL, Refills: 12    HYDROcodone-acetaminophen (NORCO/VICODIN) 5-325 MG tablet Take 1 tablet by mouth every 4 (four) hours as needed for moderate pain or severe pain. Qty: 10 tablet, Refills: 0      CONTINUE these medications which have NOT CHANGED   Details  fluticasone (FLONASE) 50 MCG/ACT nasal spray Place 2 sprays into the nose daily. Qty: 16 g, Refills: 0    Vitamin D, Ergocalciferol, (DRISDOL) 50000 UNITS CAPS capsule Take 1 capsule (50,000 Units total) by mouth every 7 (seven) days. On thursdays. Qty: 8 capsule, Refills: 0      STOP taking these medications     albuterol (VENTOLIN HFA) 108 (90 BASE) MCG/ACT inhaler      fluconazole (DIFLUCAN) 150 MG  tablet      predniSONE (STERAPRED UNI-PAK) 10 MG tablet        Allergies  Allergen Reactions  . Other Hives and Itching    Ketchup  . Tomato Hives and Itching  . Wool Alcohol [Lanolin] Itching  . Latex Itching and Rash      The results of significant diagnostics from this hospitalization (including imaging, microbiology, ancillary and laboratory) are listed below for reference.    Significant Diagnostic Studies: Dg Chest Port 1 View  07/24/2015   CLINICAL DATA:  Acute onset of shortness of breath. Initial encounter.  EXAM: PORTABLE CHEST 1 VIEW  COMPARISON:  Chest radiograph performed  12/28/2014  FINDINGS: The lungs are well-aerated and clear. There is no evidence of focal opacification, pleural effusion or pneumothorax.  The cardiomediastinal silhouette is within normal limits. No acute osseous abnormalities are seen.  IMPRESSION: No acute cardiopulmonary process seen.   Electronically Signed   By: Roanna Raider M.D.   On: 07/24/2015 19:42    Microbiology: Recent Results (from the past 240 hour(s))  MRSA PCR Screening     Status: None   Collection Time: 07/25/15  2:35 AM  Result Value Ref Range Status   MRSA by PCR NEGATIVE NEGATIVE Final    Comment:        The GeneXpert MRSA Assay (FDA approved for NASAL specimens only), is one component of a comprehensive MRSA colonization surveillance program. It is not intended to diagnose MRSA infection nor to guide or monitor treatment for MRSA infections.      Labs: Basic Metabolic Panel:  Recent Labs Lab 07/24/15 1926 07/25/15 0300 07/26/15 0533  NA 135 137 136  K 3.6 3.0* 4.3  CL 103 106 103  CO2 24 19* 23  GLUCOSE 147* 201* 86  BUN CREATININE 0.88 0.93 0.82  CALCIUM 8.5* 8.6* 9.1  MG  --   --  2.0   Liver Function Tests: No results for input(s): AST, ALT, ALKPHOS, BILITOT, PROT, ALBUMIN in the last 168 hours. No results for input(s): LIPASE, AMYLASE in the last 168 hours. No results for input(s): AMMONIA in the last 168 hours. CBC:  Recent Labs Lab 07/24/15 1926 07/25/15 0300  WBC 9.5 10.3  NEUTROABS 4.9  --   HGB 13.1 12.9  HCT 41.1 39.8  MCV 86.9 86.7  PLT 257 275   Cardiac Enzymes: No results for input(s): CKTOTAL, CKMB, CKMBINDEX, TROPONINI in the last 168 hours. BNP: BNP (last 3 results) No results for input(s): BNP in the last 8760 hours.  ProBNP (last 3 results) No results for input(s): PROBNP in the last 8760 hours.  CBG: No results for input(s): GLUCAP in the last 168 hours.     SignedRhetta Mura  [facility] 07/27/2015, 8:17 AM

## 2015-07-27 NOTE — Telephone Encounter (Signed)
Called and spoke to pt. Pt is requesting an appt with VS. Informed pt VS is booked up and offered appt with TP. Appt made with TP on 10.17.2016. Informed pt she can call back weekly to see if [facility] has any cancellations. Pt verbalized understanding and denied any further questions or concerns at this time.

## 2015-07-27 NOTE — Progress Notes (Signed)
Discussed discharge summary with patient. Reviewed all medications with patient. Patient received Rx. Patient ready for discharge. 

## 2015-08-09 ENCOUNTER — Encounter (HOSPITAL_COMMUNITY): Payer: Self-pay | Admitting: *Deleted

## 2015-08-09 ENCOUNTER — Inpatient Hospital Stay (HOSPITAL_COMMUNITY)
Admission: EM | Admit: 2015-08-09 | Discharge: 2015-08-12 | DRG: 189 | Disposition: A | Discharge status: Home / Self Care | Payer: Self-pay | Attending: Internal Medicine | Admitting: Internal Medicine

## 2015-08-09 ENCOUNTER — Emergency Department (HOSPITAL_COMMUNITY): Payer: Self-pay

## 2015-08-09 DIAGNOSIS — R05 Cough: Secondary | ICD-10-CM | POA: Diagnosis present

## 2015-08-09 DIAGNOSIS — I10 Essential (primary) hypertension: Secondary | ICD-10-CM | POA: Diagnosis present

## 2015-08-09 DIAGNOSIS — Z87891 Personal history of nicotine dependence: Secondary | ICD-10-CM

## 2015-08-09 DIAGNOSIS — R058 Other specified cough: Secondary | ICD-10-CM | POA: Diagnosis present

## 2015-08-09 DIAGNOSIS — Z7952 Long term (current) use of systemic steroids: Secondary | ICD-10-CM

## 2015-08-09 DIAGNOSIS — G4733 Obstructive sleep apnea (adult) (pediatric): Secondary | ICD-10-CM | POA: Diagnosis present

## 2015-08-09 DIAGNOSIS — J45901 Unspecified asthma with (acute) exacerbation: Secondary | ICD-10-CM

## 2015-08-09 DIAGNOSIS — Z9119 Patient's noncompliance with other medical treatment and regimen: Secondary | ICD-10-CM

## 2015-08-09 DIAGNOSIS — K219 Gastro-esophageal reflux disease without esophagitis: Secondary | ICD-10-CM | POA: Diagnosis present

## 2015-08-09 DIAGNOSIS — E876 Hypokalemia: Secondary | ICD-10-CM | POA: Diagnosis present

## 2015-08-09 DIAGNOSIS — D509 Iron deficiency anemia, unspecified: Secondary | ICD-10-CM | POA: Diagnosis present

## 2015-08-09 DIAGNOSIS — J9601 Acute respiratory failure with hypoxia: Principal | ICD-10-CM | POA: Diagnosis present

## 2015-08-09 LAB — CBC WITH DIFFERENTIAL/PLATELET
Basophils Absolute: 0 K/uL (ref 0.0–0.1)
Basophils Relative: 0 %
Eosinophils Absolute: 0.2 K/uL (ref 0.0–0.7)
Eosinophils Relative: 2 %
HCT: 38.7 % (ref 36.0–46.0)
Hemoglobin: 12.4 g/dL (ref 12.0–15.0)
Lymphocytes Relative: 32 %
Lymphs Abs: 2.2 K/uL (ref 0.7–4.0)
MCH: 27.2 pg (ref 26.0–34.0)
MCHC: 32 g/dL (ref 30.0–36.0)
MCV: 84.9 fL (ref 78.0–100.0)
Monocytes Absolute: 0.9 K/uL (ref 0.1–1.0)
Monocytes Relative: 14 %
Neutro Abs: 3.6 K/uL (ref 1.7–7.7)
Neutrophils Relative %: 52 %
Platelets: 279 K/uL (ref 150–400)
RBC: 4.56 MIL/uL (ref 3.87–5.11)
RDW: 16.9 % — ABNORMAL HIGH (ref 11.5–15.5)
WBC: 6.9 K/uL (ref 4.0–10.5)

## 2015-08-09 LAB — I-STAT ARTERIAL BLOOD GAS, ED
Acid-base deficit: 2 mmol/L (ref 0.0–2.0)
Bicarbonate: 23 mEq/L (ref 20.0–24.0)
O2 SAT: 85 %
PCO2 ART: 40.7 mmHg (ref 35.0–45.0)
PH ART: 7.36 (ref 7.350–7.450)
TCO2: 24 mmol/L (ref 0–100)
pO2, Arterial: 52 mmHg — ABNORMAL LOW (ref 80.0–100.0)

## 2015-08-09 LAB — BASIC METABOLIC PANEL WITH GFR
Anion gap: 7 (ref 5–15)
BUN: 9 mg/dL (ref 6–20)
CO2: 23 mmol/L (ref 22–32)
Calcium: 8.2 mg/dL — ABNORMAL LOW (ref 8.9–10.3)
Chloride: 103 mmol/L (ref 101–111)
Creatinine, Ser: 0.76 mg/dL (ref 0.44–1.00)
GFR calc Af Amer: >60 mL/min
GFR calc non Af Amer: >60 mL/min
Glucose, Bld: 111 mg/dL — ABNORMAL HIGH (ref 65–99)
Potassium: 3.8 mmol/L (ref 3.5–5.1)
Sodium: 133 mmol/L — ABNORMAL LOW (ref 135–145)

## 2015-08-09 LAB — MAGNESIUM: Magnesium: 1.9 mg/dL (ref 1.7–2.4)

## 2015-08-09 LAB — PHOSPHORUS: Phosphorus: 4 mg/dL (ref 2.5–4.6)

## 2015-08-09 MED ORDER — MAGNESIUM SULFATE 50 % IJ SOLN: 1.0000 g/h | INTRAMUSCULAR | Status: DC

## 2015-08-09 MED ORDER — MAGNESIUM SULFATE IN D5W 10-5 MG/ML-% IV SOLN
1.0000 g | Freq: Once | INTRAVENOUS | Status: AC
  Administered 2015-08-09: 1 g via INTRAVENOUS
  Filled 2015-08-09: qty 100

## 2015-08-09 MED ORDER — DEXAMETHASONE SODIUM PHOSPHATE 10 MG/ML IJ SOLN
10.0000 mg | Freq: Once | INTRAMUSCULAR | Status: AC
  Administered 2015-08-09: 10 mg via INTRAVENOUS
  Filled 2015-08-09: qty 1

## 2015-08-09 MED ORDER — MAGNESIUM SULFATE 50 % IJ SOLN
1.0000 g/h | INTRAVENOUS | Status: DC
  Filled 2015-08-09: qty 80

## 2015-08-09 MED ORDER — MORPHINE SULFATE (PF) 4 MG/ML IV SOLN
4.0000 mg | Freq: Once | INTRAVENOUS | Status: AC
  Administered 2015-08-09: 4 mg via INTRAVENOUS
  Filled 2015-08-09: qty 1

## 2015-08-09 MED ORDER — LORAZEPAM 2 MG/ML IJ SOLN
1.0000 mg | Freq: Once | INTRAMUSCULAR | Status: AC
  Administered 2015-08-09: 1 mg via INTRAVENOUS
  Filled 2015-08-09: qty 1

## 2015-08-09 MED ORDER — ALBUTEROL (5 MG/ML) CONTINUOUS INHALATION SOLN
10.0000 mg/h | INHALATION_SOLUTION | RESPIRATORY_TRACT | Status: DC
  Administered 2015-08-09: 10 mg/h via RESPIRATORY_TRACT
  Filled 2015-08-09: qty 20

## 2015-08-09 MED ORDER — ALBUTEROL SULFATE (2.5 MG/3ML) 0.083% IN NEBU
5.0000 mg | INHALATION_SOLUTION | Freq: Once | RESPIRATORY_TRACT | Status: AC
  Administered 2015-08-09: 5 mg via RESPIRATORY_TRACT
  Filled 2015-08-09: qty 6

## 2015-08-09 MED ORDER — IPRATROPIUM-ALBUTEROL 0.5-2.5 (3) MG/3ML IN SOLN
3.0000 mL | Freq: Once | RESPIRATORY_TRACT | Status: AC
  Administered 2015-08-09: 3 mL via RESPIRATORY_TRACT
  Filled 2015-08-09: qty 3

## 2015-08-09 MED ORDER — IPRATROPIUM-ALBUTEROL 0.5-2.5 (3) MG/3ML IN SOLN
3.0000 mL | Freq: Once | RESPIRATORY_TRACT | Status: AC
  Administered 2015-08-09: 3 mL via RESPIRATORY_TRACT

## 2015-08-09 MED ORDER — IPRATROPIUM-ALBUTEROL 0.5-2.5 (3) MG/3ML IN SOLN
RESPIRATORY_TRACT | Status: AC
  Filled 2015-08-09: qty 3

## 2015-08-09 NOTE — ED Notes (Signed)
Attempted to get pt dressed, and hooked up to the monitor. Pt stated she wanted a treatment or prednisone before she got undressed. Pt is sitting in bed waiting for doc and nurse.

## 2015-08-09 NOTE — ED Notes (Signed)
Pt reports nausea, nonproductive cough, bodyaches, chills for several days. Audible wheezing noted on arrival.

## 2015-08-09 NOTE — ED Notes (Signed)
Pt placed on 2L oxygen, sats noted to be 89-90%; sats increased to 95% on 2L

## 2015-08-09 NOTE — H&P (Signed)
[facility] History and Physical  Kathleen Rodriguez ZOX:[id number] DOB: 31-Aug-1966 DOA: 08/09/2015  Referring physician: Ms. Lelon MastSamanAtlee Abidetha. PA. PCP: No PCP Per Patient  Specialists: None.  Chief Complaint: Shortness of breath.  HPI: Atlee AbideLouise Rodriguez is a 49 y.o. female with history of asthma and OSA presents to the ER because of shortness of breath. Patient was recently admitted 2 weeks ago for asthma exacerbation. Patient states since discharge she was doing fine for 1 week following which patient started developing wheezing again. Patient states her children was sick from viral illness. Patient had some subjective feeling of fever and chills and nonproductive cough. Over the last 2 days her shortness of breath worsen. In the ER patient was found to be wheezing and hasn't required multiple episodes of treatment with nebulizer. On exam patient is not in distress and is able to complete sentences. On exam patient does have bilateral wheeze. In addition patient also comments of some chest tightness but no pain. Patient also states her shortness of breath increases on exertion. ABG shows PCO2 of 40. Patient will be admitted for asthma exacerbation.   Review of Systems: As presented in the history of presenting illness, rest negative.  Past Medical History  Diagnosis Date  . Arthritis   . Asthma   . Hypertension   . Influenza B April 2014    Complicated by multi-organ failure  . 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 OSA (obstructive sleep apnea) 03/20/2013   Past Surgical History  Procedure Laterality Date  . Tubal ligation    . Cesarean section     Social History:  reports that she quit smoking about 2 years ago. Her smoking use included Cigarettes. She has a 5 pack-year smoking history. She has never used smokeless tobacco. She reports that she does not drink alcohol or use illicit drugs. Where does patient live home. Can patient participate in ADLs? Yes.  Allergies  Allergen Reactions   . Latex Itching and Rash  . Other Hives and Itching    Ketchup  . Tomato Hives and Itching  . Wool Alcohol [Lanolin] Itching    Family History:  Family History  Problem Relation Age of Onset  . Hypertension Mother   . HIV/AIDS Father       Prior to Admission medications   Medication Sig Start Date End Date Taking? Authorizing Provider  acetaminophen (TYLENOL) 500 MG tablet Take 1,000 mg by mouth every 6 (six) hours as needed for moderate pain.   Yes Historical Provider, MD  albuterol (PROVENTIL) (2.5 MG/3ML) 0.083% nebulizer solution Take 3 mLs (2.5 mg total) by nebulization every 2 (two) hours as needed for wheezing or shortness of breath. 07/27/15  Yes Rhetta MuraJai-Gurmukh Samtani, MD  albuterol (VENTOLIN HFA) 108 (90 BASE) MCG/ACT inhaler Inhale 2 puffs into the lungs every 6 (six) hours as needed for wheezing. 07/27/15  Yes Rhetta MuraJai-Gurmukh Samtani, MD  fluticasone (FLONASE) 50 MCG/ACT nasal spray Place 2 sprays into the nose daily. 02/21/13  Yes Bernadene PersonKathryn A Whiteheart, NP  guaiFENesin (ROBITUSSIN) 100 MG/5ML liquid Take 200 mg by mouth 3 (three) times daily as needed for cough or congestion.   Yes Historical Provider, MD  Phenylephrine-Pheniramine-DM [facility](THERAFLU COLD & COUGH) 08-13-19 MG PACK Take 1 Package by mouth daily as needed (for cold).   Yes Historical Provider, MD  fluconazole (DIFLUCAN) 150 MG tablet Take 1 tablet (150 mg total) by mouth once. 07/27/15   Rhetta MuraJai-Gurmukh Samtani, MD  HYDROcodone-acetaminophen (NORCO/VICODIN) 5-325 MG tablet Take 1 tablet by mouth every 4 (four)  hours as needed for moderate pain or severe pain. 07/27/15   Rhetta Mura, MD  predniSONE (DELTASONE) 20 MG tablet Take 2 tablets (40 mg total) by mouth daily before breakfast. 07/27/15   Rhetta Mura, MD  Vitamin D, Ergocalciferol, (DRISDOL) 50000 UNITS CAPS capsule Take 1 capsule (50,000 Units total) by mouth every 7 (seven) days. On thursdays. 12/29/14   Clydia Llano, MD    Physical Exam: Filed Vitals:    08/09/15 2015 08/09/15 2141 08/09/15 2200 08/09/15 2230  BP: 102/51  135/60 143/53  Pulse: 113  109 123  Temp:      TempSrc:      Resp: SpO2: 92% 96% 99% 96%     General:  Obese not in distress.  Eyes: Anicteric no pallor.  ENT: No discharge from the ears eyes nose and mouth.  Neck: No JVD appreciated. No mass felt.  Cardiovascular: S1-S2 heard tachycardic.  Respiratory: Bilateral expiratory wheeze no crepitations.  Abdomen: Soft nontender bowel sounds present.  Skin: No rash.  Musculoskeletal: No edema.  Psychiatric: Appears normal.  Neurologic: Alert awake oriented to time place and person. Moves all extremities.  Labs on Admission:  Basic Metabolic Panel:  Recent Labs Lab 08/09/15 1850  NA 133*  K 3.8  CL 103  CO2 23  GLUCOSE 111*  BUN 9  CREATININE 0.76  CALCIUM 8.2*  MG 1.9  PHOS 4.0   Liver Function Tests: No results for input(s): AST, ALT, ALKPHOS, BILITOT, PROT, ALBUMIN in the last 168 hours. No results for input(s): LIPASE, AMYLASE in the last 168 hours. No results for input(s): AMMONIA in the last 168 hours. CBC:  Recent Labs Lab 08/09/15 1850  WBC 6.9  NEUTROABS 3.6  HGB 12.4  HCT 38.7  MCV 84.9  PLT 279   Cardiac Enzymes: No results for input(s): CKTOTAL, CKMB, CKMBINDEX, TROPONINI in the last 168 hours.  BNP (last 3 results) No results for input(s): BNP in the last 8760 hours.  ProBNP (last 3 results) No results for input(s): PROBNP in the last 8760 hours.  CBG: No results for input(s): GLUCAP in the last 168 hours.  Radiological Exams on Admission: Dg Chest 2 View  08/09/2015  CLINICAL DATA:  Short of breath. Sharp substernal chest pain today. Cough. EXAM: CHEST  2 VIEW COMPARISON:  07/24/2015 FINDINGS: The heart size and mediastinal contours are within normal limits. Both lungs are clear. No pleural effusion or pneumothorax. Bony thorax is intact. IMPRESSION: No active cardiopulmonary disease. Electronically  Signed   By: Amie Portland M.D.   On: 08/09/2015 20:55    EKG: Independently reviewed. Sinus tachycardia.  Assessment/Plan Principal Problem:   Asthma exacerbation Active Problems:   OSA (obstructive sleep apnea)   Upper airway cough syndrome   1. Asthma exacerbation - patient at this time is able to complete sentences without difficulty so we will monitor in telemetry. I have placed patient on IV steroids Pulmicort and nebulizer. Since patient states her children were recently sick and patient has subjective feeling of fever or chills though patient is afebrile we will check influenza PCR. Since patient also because of chest tightness and exertional symptoms we will check BNP and troponin. 2. OSA on sleep apnea at bedtime per respiratory.  I have reviewed patient's old charts on labs. Personally reviewed the patient's chest x-ray and EKG.   DVT Prophylaxis Lovenox.  Code Status: Full code.  Family Communication: Discussed with patient.  Disposition Plan: Admit to inpatient.  Shamon Lobo N. [facility] Pager [phone].  If 7PM-7AM, please contact night-coverage [email or web address] Password [email or web address] 08/09/2015, 11:16 PM

## 2015-08-09 NOTE — ED Notes (Signed)
Admitting MD at bedside.

## 2015-08-09 NOTE — ED Notes (Signed)
Pt ambulated to restroom with steady gait. Pt's oxygen sats decreased to 87%, placed back on oxygen 2L, sats improved

## 2015-08-09 NOTE — ED Notes (Signed)
EKG unusable due to artifact from wheezing, pt unable to tolerate lying flat.  PA made aware, will attempt after breathing treatment.

## 2015-08-09 NOTE — ED Notes (Signed)
Pt tolerating CAT; wheezing more pronounced with treatment

## 2015-08-09 NOTE — Progress Notes (Signed)
Attempted to receive report. RN unavailable at this time.  M.Piel RN

## 2015-08-10 ENCOUNTER — Ambulatory Visit: Payer: Self-pay | Admitting: Adult Health

## 2015-08-10 DIAGNOSIS — J45901 Unspecified asthma with (acute) exacerbation: Secondary | ICD-10-CM

## 2015-08-10 DIAGNOSIS — R062 Wheezing: Secondary | ICD-10-CM

## 2015-08-10 DIAGNOSIS — J387 Other diseases of larynx: Secondary | ICD-10-CM

## 2015-08-10 DIAGNOSIS — R05 Cough: Secondary | ICD-10-CM

## 2015-08-10 DIAGNOSIS — G4733 Obstructive sleep apnea (adult) (pediatric): Secondary | ICD-10-CM

## 2015-08-10 LAB — TROPONIN I

## 2015-08-10 LAB — COMPREHENSIVE METABOLIC PANEL
ALK PHOS: 56 U/L (ref 38–126)
ALT: 27 U/L (ref 14–54)
ANION GAP: 14 (ref 5–15)
AST: 34 U/L (ref 15–41)
Albumin: 3.2 g/dL — ABNORMAL LOW (ref 3.5–5.0)
BUN: 9 mg/dL (ref 6–20)
CALCIUM: 8.3 mg/dL — AB (ref 8.9–10.3)
CO2: 18 mmol/L — AB (ref 22–32)
Chloride: 103 mmol/L (ref 101–111)
Creatinine, Ser: 1.07 mg/dL — ABNORMAL HIGH (ref 0.44–1.00)
GFR calc non Af Amer: 59 mL/min — ABNORMAL LOW (ref >60)
Glucose, Bld: 308 mg/dL — ABNORMAL HIGH (ref 65–99)
Potassium: 3 mmol/L — ABNORMAL LOW (ref 3.5–5.1)
SODIUM: 135 mmol/L (ref 135–145)
TOTAL PROTEIN: 6.8 g/dL (ref 6.5–8.1)
Total Bilirubin: 0.1 mg/dL — ABNORMAL LOW (ref 0.3–1.2)

## 2015-08-10 LAB — BRAIN NATRIURETIC PEPTIDE: B Natriuretic Peptide: 15.4 pg/mL (ref 0.0–100.0)

## 2015-08-10 LAB — CBC WITH DIFFERENTIAL/PLATELET
BASOS ABS: 0 10*3/uL (ref 0.0–0.1)
BASOS PCT: 0 %
Eosinophils Absolute: 0 10*3/uL (ref 0.0–0.7)
Eosinophils Relative: 0 %
HEMATOCRIT: 39.1 % (ref 36.0–46.0)
HEMOGLOBIN: 12.5 g/dL (ref 12.0–15.0)
Lymphocytes Relative: 5 %
Lymphs Abs: 0.5 10*3/uL — ABNORMAL LOW (ref 0.7–4.0)
MCH: 27.3 pg (ref 26.0–34.0)
MCHC: 32 g/dL (ref 30.0–36.0)
MCV: 85.4 fL (ref 78.0–100.0)
MONOS PCT: 0 %
Monocytes Absolute: 0 10*3/uL — ABNORMAL LOW (ref 0.1–1.0)
NEUTROS ABS: 8.9 10*3/uL — AB (ref 1.7–7.7)
NEUTROS PCT: 95 %
Platelets: 288 10*3/uL (ref 150–400)
RBC: 4.58 MIL/uL (ref 3.87–5.11)
RDW: 16.8 % — ABNORMAL HIGH (ref 11.5–15.5)
WBC: 9.4 10*3/uL (ref 4.0–10.5)

## 2015-08-10 LAB — INFLUENZA PANEL BY PCR (TYPE A & B)
H1N1FLUPCR: NOT DETECTED
INFLAPCR: NEGATIVE
INFLBPCR: NEGATIVE

## 2015-08-10 MED ORDER — BUDESONIDE 0.25 MG/2ML IN SUSP
0.2500 mg | Freq: Two times a day (BID) | RESPIRATORY_TRACT | Status: DC
  Administered 2015-08-10 – 2015-08-12 (×5): 0.25 mg via RESPIRATORY_TRACT
  Filled 2015-08-10 (×5): qty 2

## 2015-08-10 MED ORDER — ONDANSETRON HCL 4 MG PO TABS: 4.0000 mg | ORAL_TABLET | Freq: Four times a day (QID) | ORAL | Status: DC | PRN

## 2015-08-10 MED ORDER — SALINE SPRAY 0.65 % NA SOLN
1.0000 | Freq: Four times a day (QID) | NASAL | Status: DC
  Administered 2015-08-10 – 2015-08-12 (×6): 1 via NASAL
  Filled 2015-08-10: qty 44

## 2015-08-10 MED ORDER — PANTOPRAZOLE SODIUM 40 MG PO TBEC
40.0000 mg | DELAYED_RELEASE_TABLET | Freq: Every day | ORAL | Status: DC
  Administered 2015-08-10 – 2015-08-11 (×2): 40 mg via ORAL
  Filled 2015-08-10 (×2): qty 1

## 2015-08-10 MED ORDER — ALBUTEROL SULFATE (2.5 MG/3ML) 0.083% IN NEBU
2.5000 mg | INHALATION_SOLUTION | RESPIRATORY_TRACT | Status: DC | PRN
  Administered 2015-08-10: 2.5 mg via RESPIRATORY_TRACT
  Filled 2015-08-10: qty 3

## 2015-08-10 MED ORDER — LORATADINE 10 MG PO TABS
10.0000 mg | ORAL_TABLET | Freq: Every day | ORAL | Status: DC
  Administered 2015-08-10 – 2015-08-12 (×3): 10 mg via ORAL
  Filled 2015-08-10 (×3): qty 1

## 2015-08-10 MED ORDER — METHYLPREDNISOLONE SODIUM SUCC 40 MG IJ SOLR
40.0000 mg | Freq: Three times a day (TID) | INTRAMUSCULAR | Status: DC
  Administered 2015-08-10 (×3): 40 mg via INTRAVENOUS
  Filled 2015-08-10 (×3): qty 1

## 2015-08-10 MED ORDER — METHYLPREDNISOLONE SODIUM SUCC 40 MG IJ SOLR: 40.0000 mg | Freq: Three times a day (TID) | INTRAMUSCULAR | Status: DC

## 2015-08-10 MED ORDER — OXYMETAZOLINE HCL 0.05 % NA SOLN
2.0000 | Freq: Two times a day (BID) | NASAL | Status: DC
  Administered 2015-08-10 – 2015-08-12 (×4): 2 via NASAL
  Filled 2015-08-10: qty 15

## 2015-08-10 MED ORDER — ENOXAPARIN SODIUM 40 MG/0.4ML ~~LOC~~ SOLN
40.0000 mg | Freq: Every day | SUBCUTANEOUS | Status: DC
  Administered 2015-08-10 – 2015-08-12 (×3): 40 mg via SUBCUTANEOUS
  Filled 2015-08-10 (×3): qty 0.4

## 2015-08-10 MED ORDER — FAMOTIDINE 20 MG PO TABS
40.0000 mg | ORAL_TABLET | Freq: Every day | ORAL | Status: DC
  Administered 2015-08-10 – 2015-08-11 (×2): 40 mg via ORAL
  Filled 2015-08-10 (×2): qty 2

## 2015-08-10 MED ORDER — GUAIFENESIN 100 MG/5ML PO SOLN
200.0000 mg | Freq: Three times a day (TID) | ORAL | Status: DC | PRN
  Administered 2015-08-11: 200 mg via ORAL
  Filled 2015-08-10 (×2): qty 5

## 2015-08-10 MED ORDER — POTASSIUM CHLORIDE 10 MEQ/100ML IV SOLN
10.0000 meq | INTRAVENOUS | Status: AC
  Administered 2015-08-10 (×2): 10 meq via INTRAVENOUS
  Filled 2015-08-10 (×2): qty 100

## 2015-08-10 MED ORDER — SODIUM CHLORIDE 0.9 % IJ SOLN
3.0000 mL | Freq: Two times a day (BID) | INTRAMUSCULAR | Status: DC
  Administered 2015-08-10 – 2015-08-12 (×6): 3 mL via INTRAVENOUS

## 2015-08-10 MED ORDER — METHYLPREDNISOLONE SODIUM SUCC 40 MG IJ SOLR
20.0000 mg | Freq: Two times a day (BID) | INTRAMUSCULAR | Status: DC
  Administered 2015-08-11: 20 mg via INTRAVENOUS
  Filled 2015-08-10: qty 1

## 2015-08-10 MED ORDER — ONDANSETRON HCL 4 MG/2ML IJ SOLN: 4.0000 mg | Freq: Four times a day (QID) | INTRAMUSCULAR | Status: DC | PRN

## 2015-08-10 MED ORDER — ALBUTEROL SULFATE (2.5 MG/3ML) 0.083% IN NEBU
2.5000 mg | INHALATION_SOLUTION | Freq: Three times a day (TID) | RESPIRATORY_TRACT | Status: DC
  Administered 2015-08-10 – 2015-08-11 (×3): 2.5 mg via RESPIRATORY_TRACT
  Filled 2015-08-10 (×3): qty 3

## 2015-08-10 MED ORDER — PSEUDOEPHEDRINE HCL ER 120 MG PO TB12
120.0000 mg | ORAL_TABLET | Freq: Two times a day (BID) | ORAL | Status: DC
  Administered 2015-08-10 – 2015-08-12 (×4): 120 mg via ORAL
  Filled 2015-08-10 (×5): qty 1

## 2015-08-10 MED ORDER — ACETAMINOPHEN 325 MG PO TABS: 650.0000 mg | ORAL_TABLET | Freq: Four times a day (QID) | ORAL | Status: DC | PRN

## 2015-08-10 MED ORDER — ALBUTEROL SULFATE (2.5 MG/3ML) 0.083% IN NEBU
2.5000 mg | INHALATION_SOLUTION | RESPIRATORY_TRACT | Status: DC
  Administered 2015-08-10 (×4): 2.5 mg via RESPIRATORY_TRACT
  Filled 2015-08-10 (×4): qty 3

## 2015-08-10 MED ORDER — ACETAMINOPHEN 650 MG RE SUPP: 650.0000 mg | Freq: Four times a day (QID) | RECTAL | Status: DC | PRN

## 2015-08-10 MED ORDER — FLUTICASONE PROPIONATE 50 MCG/ACT NA SUSP
2.0000 | Freq: Every day | NASAL | Status: DC
  Administered 2015-08-10: 2 via NASAL
  Filled 2015-08-10 (×2): qty 16

## 2015-08-10 MED ORDER — FLUTICASONE PROPIONATE 50 MCG/ACT NA SUSP
2.0000 | Freq: Two times a day (BID) | NASAL | Status: DC
  Administered 2015-08-10 – 2015-08-12 (×4): 2 via NASAL
  Filled 2015-08-10: qty 16

## 2015-08-10 MED ORDER — FLUTICASONE PROPIONATE 50 MCG/ACT NA SUSP: 2.0000 | Freq: Every day | NASAL | Status: DC

## 2015-08-10 NOTE — Progress Notes (Signed)
Received pt report from Brittany,RN-ED. 

## 2015-08-10 NOTE — Progress Notes (Signed)
Pt c/o being SOB; BP 160/93; HR 109; Respiratory paged and pt was given neb breathing treatment; pt states that she is feeling better.

## 2015-08-10 NOTE — Progress Notes (Signed)
Pt states that she wear CPAP at home, but pt refuse NIV tonight. Pt is stable at this time taking her nebs. No complications noted

## 2015-08-10 NOTE — Progress Notes (Signed)
PROGRESS NOTE  Kathleen Rodriguez JWJ:[id number] DOB: 10-Mar-1966 DOA: 08/09/2015 PCP: No PCP Per Patient  Brief History 49 year old female with a history of asthma, OSA presented to the emergency department with 2 day history of worsening shortness of breath and wheezing. The patient was recently discharged from the hospital on 07/27/2015 after an episode of acute respiratory failure secondary to asthma exacerbation. The patient was discharged home with prednisone and albuterol nebulizer. The patient endorsed compliance with her medications. The patient states that her children have been sick with what appears to be viral URIs. She has had subjective fevers and chills. The patient was not discharged on any home oxygen.  The patient denies any hemoptysis, nausea, vomiting, diarrhea. Patient states that she has a scheduled appointment with pulmonology today and requested further evaluation. Assessment/Plan: Acute respiratory failure with hypoxia -Secondary to asthma exacerbation -patient also has underlying obstructive sleep apnea -Continue bronchodilators Asthma exacerbation -The patient is wheezing appears to be mostly upper airway may be due to vocal cord dysfunction -Continue intravenous steroids -Continue bronchodilators -Consulted pulmonary  -Continue budesonide nebulizer -continue flonase Impaired glucose tolerance -Elevated CBGs secondary to steroids -07/26/2015 hemoglobin A1c 5.8 -Start NovoLog sliding scale Elevated blood pressure -Monitor for now without starting any agents  Family Communication:   Pt at beside Disposition Plan:   Home when medically stable       Procedures/Studies: Dg Chest 2 View  08/09/2015  CLINICAL DATA:  Short of breath. Sharp substernal chest pain today. Cough. EXAM: CHEST  2 VIEW COMPARISON:  07/24/2015 FINDINGS: The heart size and mediastinal contours are within normal limits. Both lungs are clear. No pleural effusion or pneumothorax. Bony  thorax is intact. IMPRESSION: No active cardiopulmonary disease. Electronically Signed   By: Amie Portlandavid  Ormond M.D.   On: 08/09/2015 20:55   Dg Chest Port 1 View  07/24/2015  CLINICAL DATA:  Acute onset of shortness of breath. Initial encounter. EXAM: PORTABLE CHEST 1 VIEW COMPARISON:  Chest radiograph performed 12/28/2014 FINDINGS: The lungs are well-aerated and clear. There is no evidence of focal opacification, pleural effusion or pneumothorax. The cardiomediastinal silhouette is within normal limits. No acute osseous abnormalities are seen. IMPRESSION: No acute cardiopulmonary process seen. Electronically Signed   By: Roanna RaiderJeffery  Chang M.D.   On: 07/24/2015 19:42         Subjective: She states her breathing overall is better, but Patient complains of shortness of breath on exertion. Denies any chest pain, nausea, vomiting, diarrhea, abdominal pain, dysuria, hematuria. No rashes.  Objective: Filed Vitals:   08/10/15 0457 08/10/15 0750 08/10/15 0856 08/10/15 1004  BP: 150/73  156/73 147/70  Pulse: 122  111 105  Temp:   98.2 F (36.8 C)   TempSrc:   Oral   Resp: 22  22   Height:      Weight:      SpO2: 93% 94% 95%     Intake/Output Summary (Last 24 hours) at 08/10/15 1158 Last data filed at 08/10/15 0853  Gross per 24 hour  Intake    902 ml  Output    650 ml  Net    252 ml   Weight change:  Exam:   General:  Pt is alert, follows commands appropriately, not in acute distress  HEENT: No icterus, No thrush, No neck mass, [place]/AT  Cardiovascular: RRR, S1/S2, no rubs, no gallops  Respiratory: Bilateral expiratory wheeze  Abdomen: Soft/+BS, non tender, non distended, no guarding  Extremities: trace  LE edema, No lymphangitis, No petechiae, No rashes, no synovitis  Data Reviewed: Basic Metabolic Panel:  Recent Labs Lab 08/09/15 1850 08/10/15 0110  NA 133* 135  K 3.8 3.0*  CL 103 103  CO2 23 18*  GLUCOSE 111* 308*  BUN 9 9  CREATININE 0.76 1.07*  CALCIUM 8.2* 8.3*  MG  1.9  --   PHOS 4.0  --    Liver Function Tests:  Recent Labs Lab 08/10/15 0110  AST 34  ALT 27  ALKPHOS 56  BILITOT 0.1*  PROT 6.8  ALBUMIN 3.2*   No results for input(s): LIPASE, AMYLASE in the last 168 hours. No results for input(s): AMMONIA in the last 168 hours. CBC:  Recent Labs Lab 08/09/15 1850 08/10/15 0110  WBC 6.9 9.4  NEUTROABS 3.6 8.9*  HGB 12.4 12.5  HCT 38.7 39.1  MCV 84.9 85.4  PLT 279 288   Cardiac Enzymes:  Recent Labs Lab 08/10/15 0110  TROPONINI <0.03   BNP: Invalid input(s): POCBNP CBG: No results for input(s): GLUCAP in the last 168 hours.  No results found for this or any previous visit (from the past 240 hour(s)).   Scheduled Meds: . albuterol  2.5 mg Nebulization Q4H  . budesonide (PULMICORT) nebulizer solution  0.25 mg Nebulization BID  . enoxaparin (LOVENOX) injection  40 mg Subcutaneous Daily  . fluticasone  2 spray Each Nare Daily  . methylPREDNISolone (SOLU-MEDROL) injection  40 mg Intravenous 3 times per day  . sodium chloride  3 mL Intravenous Q12H   Continuous Infusions:    Jaedah Lords, DO  [facility] Pager [phone]  If 7PM-7AM, please contact night-coverage [email or web address] Password [email or web address] 08/10/2015, 11:58 AM   LOS: 1 day

## 2015-08-10 NOTE — Consult Note (Signed)
PCCM PROGRESS NOTE  ADMISSION DATE: 08/09/2015 CONSULT DATE: 10/17 REFERRING PROVIDER: Tat  CC: Asthma  HPI This is a 49 year old African-American female followed by Dr. Craige Cotta in the outpatient setting for chronic dyspnea. Workup in the outpatient setting which has included: normal spirometry, normal CT chest, normal autoimmune panel, and normal ejection fraction, this all leading to working diagnosis of vocal cord dysfunction/upper airway cough syndrome as being a larger contributing factor than true asthma. She was just discharged on 10/3 following in hospital admission for acute hypercarbic respiratory failure felt to be secondary to asthmatic exacerbation.this hospital admit was felt to be following an episode where the patient had been cleaning her house with bleach. She was treated with BiPAP, inhaled bronchodilators, and IV steroids. She was discharged to home on 10/3 with steroid taper. At baseline she has exertional dyspnea, and is quite limited by several external factors. She states any strong fumes, or fragrance often triggers dyspnea for her. She states her house is quite dusty, and cleaning often contributes to breathing difficulty as well. She reports she was in her baseline state of health up until 10/14 when she began to notice marked increase in nasal discharge, this was followed subsequently by nonproductive wet cough, sore throat, nasal congestion,  And worsening shortness of breath. She did have generalized associated body aches, some chills, but no recorded fever. She said her family had been sick, however this was more than a week ago prior to presentation of symptoms. She had been treating her symptoms with rescue bronchodilators, as well as over  The counter cough and cold medications, as well as throat lozenges. She presented to the emergency room on 10/16with resting dyspnea, and chest tightness. She was admitted to the hospital service with working diagnosis of asthmatic  exacerbation, pulmonary critical care was asked to evaluate.  Past Medical History  Diagnosis Date  . Arthritis   . Asthma   . Hypertension   . Influenza B April 2014    Complicated by multi-organ failure  . 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 OSA (obstructive sleep apnea) 03/20/2013   Social History   Social History  . Marital Status: Married    Spouse Name: N/A  . Number of Children: N/A  . Years of Education: N/A   Occupational History  . Not on file.   Social History Main Topics  . Smoking status: Former Smoker -- 0.25 packs/day for 20 years    Types: Cigarettes    Quit date: 01/31/2013  . Smokeless tobacco: Never Used  . Alcohol Use: No  . Drug Use: No  . Sexual Activity: Yes    Birth Control/ Protection: None, Surgical   Other Topics Concern  . Not on file   Social History Narrative   Family History  Problem Relation Age of Onset  . Hypertension Mother   . HIV/AIDS Father    Allergies  Allergen Reactions  . Latex Itching and Rash  . Other Hives and Itching    Ketchup  . Tomato Hives and Itching  . Wool Alcohol [Lanolin] Itching     Medication List    ASK your doctor about these medications        acetaminophen 500 MG tablet  Commonly known as:  TYLENOL  Take 1,000 mg by mouth every 6 (six) hours as needed for moderate pain.     albuterol (2.5 MG/3ML) 0.083% nebulizer solution  Commonly known as:  PROVENTIL  Take 3 mLs (2.5 mg total) by nebulization every 2 (  two) hours as needed for wheezing or shortness of breath.     albuterol 108 (90 BASE) MCG/ACT inhaler  Commonly known as:  VENTOLIN HFA  Inhale 2 puffs into the lungs every 6 (six) hours as needed for wheezing.     fluconazole 150 MG tablet  Commonly known as:  DIFLUCAN  Take 1 tablet (150 mg total) by mouth once.     fluticasone 50 MCG/ACT nasal spray  Commonly known as:  FLONASE  Place 2 sprays into the nose daily.     guaiFENesin 100 MG/5ML liquid  Commonly known as:   ROBITUSSIN  Take 200 mg by mouth 3 (three) times daily as needed for cough or congestion.     HYDROcodone-acetaminophen 5-325 MG tablet  Commonly known as:  NORCO/VICODIN  Take 1 tablet by mouth every 4 (four) hours as needed for moderate pain or severe pain.     predniSONE 20 MG tablet  Commonly known as:  DELTASONE  Take 2 tablets (40 mg total) by mouth daily before breakfast.     THERAFLU COLD & COUGH 08-13-19 MG Pack  Generic drug:  Phenylephrine-Pheniramine-DM  Take 1 Package by mouth daily as needed (for cold).     Vitamin D (Ergocalciferol) 50000 UNITS Caps capsule  Commonly known as:  DRISDOL  Take 1 capsule (50,000 Units total) by mouth every 7 (seven) days. On thursdays.      ROS General: Endorses generalized muscle aches, weakness, and chills. No recorded fever. HEENT: Positive for headache, marked nasal congestion, marked postnasal discharge, marked sinus congestion, sore throat. Pulmonary: Resting dyspnea, audible loud upper airway wheeze, cough productive only of clear sputum. Chest tightness has resolved. Cardiac: No chest pain, no palpitations, no lower extremity edema. Abdomen: Positive for occasional reflux. Denies nausea or vomiting. Musculoskeletal: No strength loss, new traumatic injuries. Neuro: Within normal.  SUBJECTIVE: Continues to feel short of breath  OBJECTIVE: Temp:  [97.6 F (36.4 C)-98.5 F (36.9 C)] 98.4 F (36.9 C) (10/17 1300) Pulse Rate:  [88-136] 117 (10/17 1300) Resp:  [15-30] 22 (10/17 1300) BP: (102-158)/(51-89) 141/75 mmHg (10/17 1300) SpO2:  [90 %-100 %] 96 % (10/17 1300) FiO2 (%):  [100 %] 100 % (10/16 1836) Weight:  [123.2 kg (271 lb 9.7 oz)] 123.2 kg (271 lb 9.7 oz) (10/17 0100)   General: 49 year old female currently sitting upright in bed. Speaking full sentences. HEENT: normocephalic atraumatic. No JVD. Mucous membranes are moist. She  Has copious thick postnasal drainage, also has marked upper airway wheeze this transmits  throughout her chest Cardiac: regular rate and rhythm, no murmur rub or gallop Chest: faint expiratory wheeze noted accessory muscle use Abd: soft nontender no organomegaly Ext: no edema 2+ pulses brisk cap refill Neuro: awake alert no focal deficits Skin: warm and dry   CMP Latest Ref Rng 08/10/2015 08/09/2015 07/26/2015  Glucose 65 - 99 mg/dL 161(W308(H) 960(A111(H) 86  BUN 6 - 20 mg/dL 9 9 11   Creatinine 0.44 - 1.00 mg/dL 5.40(J1.07(H) 8.110.76 9.140.82  Sodium 135 - 145 mmol/L 135 133(L) 136  Potassium 3.5 - 5.1 mmol/L 3.0(L) 3.8 4.3  Chloride 101 - 111 mmol/L 103 103 103  CO2 22 - 32 mmol/L 18(L) 23 23  Calcium 8.9 - 10.3 mg/dL 8.3(L) 8.2(L) 9.1  Total Protein 6.5 - 8.1 g/dL 6.8 - -  Total Bilirubin 0.3 - 1.2 mg/dL 7.8(G0.1(L) - -  Alkaline Phos 38 - 126 U/L 56 - -  AST 15 - 41 U/L 34 - -  ALT 14 -  54 U/L 27 - -     CBC Latest Ref Rng 08/10/2015 08/09/2015 07/25/2015  WBC 4.0 - 10.5 K/uL 9.4 6.9 10.3  Hemoglobin 12.0 - 15.0 g/dL 16.1 09.6 04.5  Hematocrit 36.0 - 46.0 % 39.1 38.7 39.8  Platelets 150 - 400 K/uL 288 279 275     Dg Chest 2 View  08/09/2015  CLINICAL DATA:  Short of breath. Sharp substernal chest pain today. Cough. EXAM: CHEST  2 VIEW COMPARISON:  07/24/2015 FINDINGS: The heart size and mediastinal contours are within normal limits. Both lungs are clear. No pleural effusion or pneumothorax. Bony thorax is intact. IMPRESSION: No active cardiopulmonary disease. Electronically Signed   By: Amie Portland M.D.   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 Seemingly triggers appeared to be allergy related and triggered primarily from postnasal discharge.  There could be a small component of of bronchospasm, however the majority of the symptom burden is in upper airway.  Pulmonary asked to assist with care.  ASSESSMENT/PLAN: Acute dyspnea in the setting of decompensated VCD/LPR/ upper airway cough syndrome >clearly nasal d/c is a significant factor. Raises concern about allergin exposure at the home.  Plan Nasal saline f/b afrin f/b nasal steroid X 3 d, then switch to daily nasal steroid Add antihistamine  Add nasal decongestant  Add PPI and HS H2 blocker Reflux precautions Consider having pt stay at sister's house after d/c to see if still possibly something from her home is triggering these events   Possible asthmatic exacerbation  Plan Steroid taper Scheduled BDs  OSA Plan Auto-set CPAP  Simonne Martinet ACNP-BC [facility] Pulmonary/Critical Care Pager # [phone] OR # [phone] if no answer   ATTENDING NOTE  49 yo female well known to me.  She presented with dyspnea, cough, and wheeze.  She reports trouble with aero-allergens and strong smells.  She was having lower airway symptoms, but now feels most of her issues are upper airway.  She has sinus congestion, post-nasal drip, hoarseness, and raspy cough.  She has wheeze from her throat.  She has also been getting reflux symptoms.  She is seen in hospital room, sitting on side of bed.  She is alert, normal strength, no sinus tenderness, raspy voice, wheeze over neck, no wheeze in chest, heart rate regular.  Labs from 10/17 notable for low potassium, elevated creatinine.  CXR from 08/08/16 negative for acute process.  For Upper airway cough syndrome >> flonase, nasal irrigation, sudafed, afrin, claritin.  For possible asthma component >> change to tid albuterol with q2h prn, continue pulmicort, change solumedrol to 20 mg q12h.  For Laryngopharyngeal reflux >> protonix, pepcid.  [name], MD [facility] 08/10/2015, 3:10 PM Pager:  [phone] After 3pm call: [phone]

## 2015-08-10 NOTE — ED Provider Notes (Signed)
CSN: [id number]     Arrival date & time 08/09/15  1805 History   First MD Initiated Contact with Patient 08/09/15 1817     Chief Complaint  Patient presents with  . Cough  . Generalized Body Aches     (Consider location/radiation/quality/duration/timing/severity/associated sxs/prior Treatment) Patient is a 49 y.o. female presenting with cough.  Cough  Zaiyah Sottile is a 49 y.o F with a recent admission for asthma exacerbation and acute respiratory failure with hypercapnia who presents to the ED today c/o worsening cough and increased SOB. Pt very dyspneic, tachypneic and tachycardic upon arrival to ED. Patient has associated symptoms of sore throat, nasal congestion, generalized body aches, chills, chest tightness. Denies fever. Pt states that her symptoms have been worsening over the last 4 days with no relief from home nebulizers or albuterol. Pt is concerned that she may have PNA. Received flu shot prior to discharge from last admission. Denies headache, blurry vision, weakness, paresthesias, vomiting, diarrhea, sick contacts.      Past Medical History  Diagnosis Date  . Arthritis   . Asthma   . Hypertension   . Influenza B April 2014    Complicated by multi-organ failure  . 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 OSA (obstructive sleep apnea) 03/20/2013   Past Surgical History  Procedure Laterality Date  . Tubal ligation    . Cesarean section     Family History  Problem Relation Age of Onset  . Hypertension Mother   . HIV/AIDS Father    Social History  Substance Use Topics  . Smoking status: Former Smoker -- 0.25 packs/day for 20 years    Types: Cigarettes    Quit date: 01/31/2013  . Smokeless tobacco: Never Used  . Alcohol Use: No   OB History    No data available     Review of Systems  Respiratory: Positive for cough.   All other systems reviewed and are negative.     Allergies  Latex; Other; Tomato; and Wool alcohol  Home Medications   Prior to  Admission medications   Medication Sig Start Date End Date Taking? Authorizing Provider  acetaminophen (TYLENOL) 500 MG tablet Take 1,000 mg by mouth every 6 (six) hours as needed for moderate pain.   Yes Historical Provider, MD  albuterol (PROVENTIL) (2.5 MG/3ML) 0.083% nebulizer solution Take 3 mLs (2.5 mg total) by nebulization every 2 (two) hours as needed for wheezing or shortness of breath. 07/27/15  Yes Rhetta Mura, MD  albuterol (VENTOLIN HFA) 108 (90 BASE) MCG/ACT inhaler Inhale 2 puffs into the lungs every 6 (six) hours as needed for wheezing. 07/27/15  Yes Rhetta Mura, MD  fluticasone (FLONASE) 50 MCG/ACT nasal spray Place 2 sprays into the nose daily. 02/21/13  Yes Bernadene Person, NP  guaiFENesin (ROBITUSSIN) 100 MG/5ML liquid Take 200 mg by mouth 3 (three) times daily as needed for cough or congestion.   Yes Historical Provider, MD  Phenylephrine-Pheniramine-DM [facility] COLD & COUGH) 08-13-19 MG PACK Take 1 Package by mouth daily as needed (for cold).   Yes Historical Provider, MD  fluconazole (DIFLUCAN) 150 MG tablet Take 1 tablet (150 mg total) by mouth once. 07/27/15   Rhetta Mura, MD  HYDROcodone-acetaminophen (NORCO/VICODIN) 5-325 MG tablet Take 1 tablet by mouth every 4 (four) hours as needed for moderate pain or severe pain. 07/27/15   Rhetta Mura, MD  predniSONE (DELTASONE) 20 MG tablet Take 2 tablets (40 mg total) by mouth daily before breakfast. 07/27/15   Rhetta Mura,  MD  Vitamin D, Ergocalciferol, (DRISDOL) 50000 UNITS CAPS capsule Take 1 capsule (50,000 Units total) by mouth every 7 (seven) days. On thursdays. 12/29/14   Mutaz Elmahi, MD   BP 172/85 mmHg  Pulse 110  Temp(Src) 98 F (36.7 C) (Oral)  Resp 22  Ht  (1.651 m)  Wt 271 lb 9.7 oz (123.2 kg)  BMI 45.20 kg/m2  SpO2 96% Physical Exam  Constitutional: She is oriented to person, place, and time. She appears well-developed and well-nourished. She appears distressed.  Pt  using accessory muscle use to breath. Very tachypneic and anxious.   HENT:  Head: Normocephalic and atraumatic.  Mouth/Throat: No oropharyngeal exudate.  Eyes: Conjunctivae and EOM are normal. Pupils are equal, round, and reactive to light. Right eye exhibits no discharge. Left eye exhibits no discharge. No scleral icterus.  Neck: Normal range of motion. Neck supple. No JVD present. No tracheal deviation present.  Cardiovascular: Regular rhythm, normal heart sounds and intact distal pulses.  Exam reveals no gallop and no friction rub.   No murmur heard. Tachycardic to 132  Pulmonary/Chest: No stridor. She is in respiratory distress. She has wheezes. She has rales. She exhibits no tenderness.  Abdominal: Soft. Bowel sounds are normal. She exhibits no distension and no mass. There is no tenderness. There is no rebound and no guarding.  Musculoskeletal: Normal range of motion. She exhibits no edema.  Lymphadenopathy:    She has no cervical adenopathy.  Neurological: She is alert and oriented to person, place, and time. No cranial nerve deficit.  Strength 5/5 throughout. No sensory deficits.    Skin: Skin is warm and dry. No rash noted. She is not diaphoretic. No erythema. No pallor.  Psychiatric: She has a normal mood and affect. Her behavior is normal.  Nursing note and vitals reviewed.   ED Course  Procedures (including critical care time) Labs Review Labs Reviewed  BASIC METABOLIC PANEL - Abnormal; Notable for the following:    Sodium 133 (*)    Glucose, Bld 111 (*)    Calcium 8.2 (*)    All other components within normal limits  CBC WITH DIFFERENTIAL/PLATELET - Abnormal; Notable for the following:    RDW 16.9 (*)    All other components within normal limits  COMPREHENSIVE METABOLIC PANEL - Abnormal; Notable for the following:    Potassium 3.0 (*)    CO2 18 (*)    Glucose, Bld 308 (*)    Creatinine, Ser 1.07 (*)    Calcium 8.3 (*)    Albumin 3.2 (*)    Total Bilirubin 0.1 (*)     GFR calc non Af Amer 59 (*)    All other components within normal limits  CBC WITH DIFFERENTIAL/PLATELET - Abnormal; Notable for the following:    RDW 16.8 (*)    Neutro Abs 8.9 (*)    Lymphs Abs 0.5 (*)    Monocytes Absolute 0.0 (*)    All other components within normal limits  I-STAT ARTERIAL BLOOD GAS, ED - Abnormal; Notable for the following:    pO2, Arterial 52.0 (*)    All other components within normal limits  MAGNESIUM  PHOSPHORUS  INFLUENZA PANEL BY PCR (TYPE A & B, H1N1)  BRAIN NATRIURETIC PEPTIDE  TROPONIN I  IRON AND TIBC  FERRITIN    Imaging Review Dg Chest 2 View  08/09/2015  CLINICAL DATA:  Short of breath. Sharp substernal chest pain today. Cough. EXAM: CHEST  2 VIEW COMPARISON:  07/24/2015 FINDINGS: The heart  size and mediastinal contours are within normal limits. Both lungs are clear. No pleural effusion or pneumothorax. Bony thorax is intact. IMPRESSION: No active cardiopulmonary disease. Electronically Signed   By: Amie Portlandavid  Ormond M.D.   On: 08/09/2015 20:55   I have personally reviewed and evaluated these images and lab results as part of my medical decision-making.   EKG Interpretation   Date/Time:  Sunday August 09 2015 19:39:39 EDT Ventricular Rate:  107 PR Interval:  123 QRS Duration: 87 QT Interval:  325 QTC Calculation: 434 R Axis:   98 Text Interpretation:  Sinus tachycardia Left atrial enlargement Borderline  right axis deviation Nonspecific T abnormalities, lateral leads similar to  prior EKG Confirmed by BELFI  MD, MELANIE ([id number]) on 08/09/2015 8:22:24 PM      MDM   Final diagnoses:  Asthma exacerbation    Pt with recent admission for asthma exacerbation and acute respiratory failure with hypercapnia presents to Ed with increased SOB and cough. Pt very tachypneic, tachycardic, dyspneic on exam. Pt using accessory muscle use to breath. Diffuse rales and wheezes appreciated throughout all lung bases. Pt immediately given 3 duonebs and  20mg  IV decadron. Pt with mild symptomatic improvement however, chest still very tight. CXR neg for infection or consolidation. Pt given IV magnesium and continuous nebulize.   PH 7.36 pCO2 40.7  Spoke with hospitalist who will admit pt to step down for further evaluation and observation. Pt still tachycardic however, now able to breathe easier symptomatically. Pt ambulated with a decrease in pulse ox to 87%. Pt placed on O2 2L. Sats improved.       Lester KinsmanSamantha Tripp [place], PA-C 08/10/15 1821  Rolan BuccoMelanie Belfi, MD 08/10/15 [phone]

## 2015-08-10 NOTE — Progress Notes (Signed)
Flu tests resulted negative; Droplet precautions discontinued per protocol.

## 2015-08-10 NOTE — Progress Notes (Signed)
Utilization review completed. Vencil Basnett, RN, BSN. 

## 2015-08-11 ENCOUNTER — Telehealth: Payer: Self-pay | Admitting: Pulmonary Disease

## 2015-08-11 LAB — IRON AND TIBC
IRON: 19 ug/dL — AB (ref 28–170)
Saturation Ratios: 5 % — ABNORMAL LOW (ref 10.4–31.8)
TIBC: 370 ug/dL (ref 250–450)
UIBC: 351 ug/dL

## 2015-08-11 LAB — GLUCOSE, CAPILLARY
Glucose-Capillary: 108 mg/dL — ABNORMAL HIGH (ref 65–99)
Glucose-Capillary: 107 mg/dL — ABNORMAL HIGH (ref 65–99)

## 2015-08-11 LAB — FERRITIN: Ferritin: 15 ng/mL (ref 11–307)

## 2015-08-11 MED ORDER — PREDNISONE 20 MG PO TABS
20.0000 mg | ORAL_TABLET | Freq: Every day | ORAL | Status: DC
  Administered 2015-08-11: 20 mg via ORAL
  Filled 2015-08-11: qty 1

## 2015-08-11 MED ORDER — TRAMADOL HCL 50 MG PO TABS
50.0000 mg | ORAL_TABLET | Freq: Once | ORAL | Status: AC
Start: 2015-08-11 — End: 2015-08-11
  Administered 2015-08-11: 50 mg via ORAL
  Filled 2015-08-11: qty 1

## 2015-08-11 MED ORDER — INSULIN ASPART 100 UNIT/ML ~~LOC~~ SOLN: 0.0000 [IU] | Freq: Three times a day (TID) | SUBCUTANEOUS | Status: DC

## 2015-08-11 MED ORDER — SODIUM CHLORIDE 0.9 % IV SOLN
125.0000 mg | Freq: Once | INTRAVENOUS | Status: AC
  Administered 2015-08-11: 125 mg via INTRAVENOUS
  Filled 2015-08-11: qty 10

## 2015-08-11 MED ORDER — TRAMADOL HCL 50 MG PO TABS
50.0000 mg | ORAL_TABLET | Freq: Four times a day (QID) | ORAL | Status: AC | PRN
  Administered 2015-08-11 – 2015-08-12 (×2): 50 mg via ORAL
  Filled 2015-08-11 (×2): qty 1

## 2015-08-11 MED ORDER — INSULIN ASPART 100 UNIT/ML ~~LOC~~ SOLN: 0.0000 [IU] | Freq: Every day | SUBCUTANEOUS | Status: DC

## 2015-08-11 MED ORDER — FERROUS SULFATE 325 (65 FE) MG PO TABS: 325.0000 mg | ORAL_TABLET | Freq: Two times a day (BID) | ORAL | Status: DC

## 2015-08-11 MED ORDER — ALBUTEROL SULFATE (2.5 MG/3ML) 0.083% IN NEBU
2.5000 mg | INHALATION_SOLUTION | RESPIRATORY_TRACT | Status: DC
  Administered 2015-08-11 – 2015-08-12 (×4): 2.5 mg via RESPIRATORY_TRACT
  Filled 2015-08-11 (×4): qty 3

## 2015-08-11 NOTE — Care Management Note (Signed)
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 She has Medicaid for her children and receives food stamps $425.00 per month. No PCP/ no insurance; patient lost her job / insurance a year ago and has been using the ER for her first line of care. Shanda BumpsJessica with the [facility] called to get established at the Clinic; Lyn HollingsheadKimberly Lanier with [facility] called to see what additional services that patient could qualify for. [facility] ( [facility]) about 2 weeks ago and she is only allowed to use this funding once a year. CM informed patient that at discharge [facility] can [facility].  Expected Discharge Date:   possibly 08/12/2015               Expected Discharge Plan:  Home/Self Care  Discharge planning Services  CM Consult, [facility]  Status of Service:  In process, will continue to follow   Reola MosherChandler, Lynford Espinoza L, RN,MHA,BSN [phone] 08/11/2015, 10:01 AM

## 2015-08-11 NOTE — Progress Notes (Signed)
Pt reported needing something for pain; went to room to assess pt and she is snoring.  Pain meds not given.

## 2015-08-11 NOTE — Progress Notes (Signed)
PCCM PROGRESS NOTE  ADMISSION DATE: 08/09/2015 CONSULT DATE: 08/10/2015 REFERRING PROVIDER: Dr. Carles Collet  CC: Wheezing  SUBJECTIVE: She is getting better drainage from her sinuses, and coughing up phlegm from her throat more easily.  She is not wheezing as much.  She is worried about her blood pressure.  She still gets winded, and thinks she needs home oxygen.  She also thinks she needs handicap parking sticker.  OBJECTIVE: BP 159/94 mmHg  Pulse 102  Temp(Src) 97.6 F (36.4 C) (Oral)  Resp 22  Ht 5\' 5"  (1.651 m)  Wt 267 lb 5.4 oz (121.265 kg)  BMI 44.49 kg/m2  SpO2 98%  I/O last 3 completed shifts: In: 1586 [P.O.:1386; IV Piggyback:200] Out: 2275 [Urine:2275]  General: sitting up in bed HEENT: no sinus tenderness, no stridor Cardiac: regular, nom murmur Chest: better air movement, no wheezing Abd: soft, non tender Ext: no edema Neuro: normal strength Skin: no rashes   CMP Latest Ref Rng 08/10/2015 08/09/2015 07/26/2015  Glucose 65 - 99 mg/dL 308(H) 111(H) 86  BUN 6 - 20 mg/dL 9 9 11   Creatinine 0.44 - 1.00 mg/dL 1.07(H) 0.76 0.82  Sodium 135 - 145 mmol/L 135 133(L) 136  Potassium 3.5 - 5.1 mmol/L 3.0(L) 3.8 4.3  Chloride 101 - 111 mmol/L 103 103 103  CO2 22 - 32 mmol/L 18(L) 23 23  Calcium 8.9 - 10.3 mg/dL 8.3(L) 8.2(L) 9.1  Total Protein 6.5 - 8.1 g/dL 6.8 - -  Total Bilirubin 0.3 - 1.2 mg/dL 0.1(L) - -  Alkaline Phos 38 - 126 U/L 56 - -  AST 15 - 41 U/L 34 - -  ALT 14 - 54 U/L 27 - -     CBC Latest Ref Rng 08/10/2015 08/09/2015 07/25/2015  WBC 4.0 - 10.5 K/uL 9.4 6.9 10.3  Hemoglobin 12.0 - 15.0 g/dL 12.5 12.4 12.9  Hematocrit 36.0 - 46.0 % 39.1 38.7 39.8  Platelets 150 - 400 K/uL 288 279 275     Dg Chest 2 View  08/09/2015  CLINICAL DATA:  Short of breath. Sharp substernal chest pain today. Cough. EXAM: CHEST  2 VIEW COMPARISON:  07/24/2015 FINDINGS: The heart size and mediastinal contours are within normal limits. Both lungs are clear. No pleural effusion  or pneumothorax. Bony thorax is intact. IMPRESSION: No active cardiopulmonary disease. Electronically Signed   By: Lajean Manes M.D.   On: 08/09/2015 20:55    STUDIES: 02/05/13 CT chest >> no PE, no acute findings 02/11/13 Labs >> ANA negative, Anti-GBM Ab < 1, ANCA negative PSG 04/19/13 >> AHI 16.4, SpO2 low 84%. PFT 05/15/13 >> FEV1 1.90 (76%), FEV1% 72, TLC 4.23 (81%), DLCO 89%, no BD Echo 04/06/14 >> mod LVH, EF 65 to 70%  EVENTS: 10/16 Admit 10/18 Transition to prednisone  DISCUSSION: 49 yo female former smoker admitted with recurrent wheezing, dyspnea.  This is likely combination of irritant/allergy induced rhino-sinusitis with post nasal drip and upper airway cough syndrome, laryngopharyngeal reflux, asthma exacerbation, and obesity with deconditioning.  She also has hx of OSA on CPAP, and persistent dyspnea/fatigue after influenza PNA in April 2014.  She has improvement with augmentation in sinus regimen.  ASSESSMENT/PLAN:  Asthma exacerbation. Plan: - change to 20 mg prednisone on 10/18 and wean off as tolerated - continue pulmicort, albuterol  Upper airway cough syndrome with irritant/allergic rhino-sinusitis. Plan: - continue nasal irrigation, flonase, claritin - continue afrin, sudafed for now - might need additional allergy testing as outpt  Laryngopharyngeal reflux. Plan: - continue protonix, pepcid  Obstructive sleep apnea. Plan: - CPAP qhs  Dyspnea, hypoxia. Plan: - assess for home oxygen prior to discharge  SUMMARY: She is improved compared to 10/17.  Will transition to prednisone.  If stable, then likely ready for d/c home 10/19.  D/w Dr. Carles Collet.  Chesley Mires, MD [facility] 08/11/2015, 10:02 AM Pager:  [phone] After 3pm call: [phone]

## 2015-08-11 NOTE — Progress Notes (Addendum)
PROGRESS NOTE  Kathleen Rodriguez ZOX:[id number] DOB: Oct 14, 1966 DOA: 08/09/2015 PCP: No PCP Per Patient  Brief History 49 year old female with a history of asthma, OSA presented to the emergency department with 2 day history of worsening shortness of breath and wheezing. The patient was recently discharged from the hospital on 07/27/2015 after an episode of acute respiratory failure secondary to asthma exacerbation. The patient was discharged home with prednisone and albuterol nebulizer. The patient endorsed compliance with her medications. The patient states that her children have been sick with what appears to be viral URIs. She has had subjective fevers and chills. The patient was not discharged on any home oxygen. The patient denies any hemoptysis, nausea, vomiting, diarrhea. Patient states that she has a scheduled appointment with pulmonology today and requested further evaluation. Assessment/Plan: Acute respiratory failure with hypoxia -Secondary to asthma exacerbation -patient also has underlying obstructive sleep apnea -Continue bronchodilators -now stable on RA Asthma exacerbation -The patient is wheezing appears to be mostly upper airway may be due to vocal cord dysfunction -08/11/2015--switch to po steroids -Continue bronchodilators -Continue budesonide  -Appreciate pulmonary--discussed with Dr. Craige CottaSood  -Continue budesonide nebulizer Upper airway cough syndrome with irritant/allergic rhino-sinusitis. -continue flonase, Sudafed, loratidine Impaired glucose tolerance -Elevated CBGs secondary to steroids -07/26/2015 hemoglobin A1c 5.8 -Start NovoLog sliding scale Elevated blood pressure -Monitor for now without starting any agents Hypokalemia -Replete OSA -hs CPAP Family Communication: Pt at beside Disposition Plan: Home 10/19 if stable      Procedures/Studies: Dg Chest 2 View  08/09/2015  CLINICAL DATA:  Short of breath. Sharp substernal chest pain today.  Cough. EXAM: CHEST  2 VIEW COMPARISON:  07/24/2015 FINDINGS: The heart size and mediastinal contours are within normal limits. Both lungs are clear. No pleural effusion or pneumothorax. Bony thorax is intact. IMPRESSION: No active cardiopulmonary disease. Electronically Signed   By: Amie Portlandavid  Ormond M.D.   On: 08/09/2015 20:55   Dg Chest Port 1 View  07/24/2015  CLINICAL DATA:  Acute onset of shortness of breath. Initial encounter. EXAM: PORTABLE CHEST 1 VIEW COMPARISON:  Chest radiograph performed 12/28/2014 FINDINGS: The lungs are well-aerated and clear. There is no evidence of focal opacification, pleural effusion or pneumothorax. The cardiomediastinal silhouette is within normal limits. No acute osseous abnormalities are seen. IMPRESSION: No acute cardiopulmonary process seen. Electronically Signed   By: Roanna RaiderJeffery  Chang M.D.   On: 07/24/2015 19:42         Subjective: Overall breathing better but still complains of dyspnea on exertion. Denies any fevers, chest, chest pain, nausea, vomiting, diarrhea, abdominal pain. No dysuria or hematuria. No rashes.   Objective: Filed Vitals:   08/11/15 0733 08/11/15 0954 08/11/15 1357 08/11/15 1423  BP:  110/71  110/67  Pulse: 102 111 100 110  Temp:  97.6 F (36.4 C)  97.4 F (36.3 C)  TempSrc:  Oral  Oral  Resp: 22 20 20 18   Height:      Weight:      SpO2: 98% 98% 98% 98%    Intake/Output Summary (Last 24 hours) at 08/11/15 1848 Last data filed at 08/11/15 1750  Gross per 24 hour  Intake    800 ml  Output   1600 ml  Net   -800 ml   Weight change: -1.935 kg (-4 lb 4.3 oz) Exam:   General:  Pt is alert, follows commands appropriately, not in acute distress  HEENT: No icterus, No thrush, No neck mass, [place]/AT  Cardiovascular:  RRR, S1/S2, no rubs, no gallops  Respiratory: mostly upper airway wheeze. Good air movement.   Abdomen: Soft/+BS, non tender, non distended, no guarding  Extremities: trace LE edema, No lymphangitis, No petechiae,  No rashes, no synovitis  Data Reviewed: Basic Metabolic Panel:  Recent Labs Lab 08/09/15 1850 08/10/15 0110  NA 133* 135  K 3.8 3.0*  CL 103 103  CO2 23 18*  GLUCOSE 111* 308*  BUN 9 9  CREATININE 0.76 1.07*  CALCIUM 8.2* 8.3*  MG 1.9  --   PHOS 4.0  --    Liver Function Tests:  Recent Labs Lab 08/10/15 0110  AST 34  ALT 27  ALKPHOS 56  BILITOT 0.1*  PROT 6.8  ALBUMIN 3.2*   No results for input(s): LIPASE, AMYLASE in the last 168 hours. No results for input(s): AMMONIA in the last 168 hours. CBC:  Recent Labs Lab 08/09/15 1850 08/10/15 0110  WBC 6.9 9.4  NEUTROABS 3.6 8.9*  HGB 12.4 12.5  HCT 38.7 39.1  MCV 84.9 85.4  PLT 279 288   Cardiac Enzymes:  Recent Labs Lab 08/10/15 0110  TROPONINI <0.03   BNP: Invalid input(s): POCBNP CBG:  Recent Labs Lab 08/11/15 1807  GLUCAP 108*    No results found for this or any previous visit (from the past 240 hour(s)).   Scheduled Meds: . albuterol  2.5 mg Nebulization Q4H  . budesonide (PULMICORT) nebulizer solution  0.25 mg Nebulization BID  . enoxaparin (LOVENOX) injection  40 mg Subcutaneous Daily  . famotidine  40 mg Oral QHS  . ferric gluconate (FERRLECIT/NULECIT) IV  125 mg Intravenous Once  . [START ON 08/12/2015] ferrous sulfate  325 mg Oral BID WC  . fluticasone  2 spray Each Nare BID   Followed by  . [START ON 08/15/2015] fluticasone  2 spray Each Nare Daily  . insulin aspart  0-15 Units Subcutaneous TID WC  . insulin aspart  0-5 Units Subcutaneous QHS  . loratadine  10 mg Oral Daily   And  . pseudoephedrine  120 mg Oral BID  . oxymetazoline  2 spray Each Nare BID  . pantoprazole  40 mg Oral Q1200  . predniSONE  20 mg Oral Q breakfast  . sodium chloride  1 spray Each Nare QID  . sodium chloride  3 mL Intravenous Q12H   Continuous Infusions:    Rayland Hamed, DO  [facility] Pager [phone]  If 7PM-7AM, please contact night-coverage [email or web address] Password [email or web address] 08/11/2015,  6:48 PM   LOS: 2 days

## 2015-08-11 NOTE — Progress Notes (Signed)
SATURATION QUALIFICATIONS: (This note is used to comply with regulatory documentation for home oxygen)  Patient Saturations on Room Air at Rest = 97%  Patient Saturations on Room Air while Ambulating = 97%  Patient Saturations on 2 Liters of oxygen while Ambulating = 98%  Please briefly explain why patient needs home oxygen:  Pt does not qualify for home oxygen.

## 2015-08-11 NOTE — Progress Notes (Signed)
Pt. C/o sharp, aching, knee pain. Text page sent to on call NP, K. Craige CottaKirby. New orders received. RN will administer as ordered. Jeryn Cerney, Cheryll DessertKaren Cherrell

## 2015-08-11 NOTE — Telephone Encounter (Signed)
Called and spoke to pt. Pt is requesting handicap placard. Pt is currently admitted.  Dr. Craige CottaSood please advise. Thanks.

## 2015-08-11 NOTE — Progress Notes (Signed)
Pt states she will not wear cpap tonight. 02 in use at 3lpm

## 2015-08-11 NOTE — Progress Notes (Signed)
When checking O2 sats with pt, even though pt's sats were 97-98%, pt exhibited huffing and puffing, as if she was short of breath.  Pt commented, "can't you hear me wheezing?"

## 2015-08-12 ENCOUNTER — Telehealth: Payer: Self-pay

## 2015-08-12 DIAGNOSIS — D509 Iron deficiency anemia, unspecified: Secondary | ICD-10-CM

## 2015-08-12 DIAGNOSIS — R0609 Other forms of dyspnea: Secondary | ICD-10-CM

## 2015-08-12 LAB — BASIC METABOLIC PANEL
ANION GAP: 6 (ref 5–15)
BUN: 14 mg/dL (ref 6–20)
CALCIUM: 8.6 mg/dL — AB (ref 8.9–10.3)
CO2: 29 mmol/L (ref 22–32)
Chloride: 103 mmol/L (ref 101–111)
Creatinine, Ser: 0.82 mg/dL (ref 0.44–1.00)
Glucose, Bld: 91 mg/dL (ref 65–99)
POTASSIUM: 4.1 mmol/L (ref 3.5–5.1)
Sodium: 138 mmol/L (ref 135–145)

## 2015-08-12 LAB — MAGNESIUM: MAGNESIUM: 1.9 mg/dL (ref 1.7–2.4)

## 2015-08-12 LAB — GLUCOSE, CAPILLARY: Glucose-Capillary: 90 mg/dL (ref 65–99)

## 2015-08-12 MED ORDER — TRAMADOL HCL 50 MG PO TABS: 50.0000 mg | ORAL_TABLET | Freq: Four times a day (QID) | ORAL | Status: DC | PRN

## 2015-08-12 MED ORDER — OXYMETAZOLINE HCL 0.05 % NA SOLN: 2.0000 | Freq: Two times a day (BID) | NASAL | Status: DC

## 2015-08-12 MED ORDER — BUDESONIDE 0.25 MG/2ML IN SUSP: 0.2500 mg | Freq: Two times a day (BID) | RESPIRATORY_TRACT | Status: DC

## 2015-08-12 MED ORDER — PREDNISONE 20 MG PO TABS
20.0000 mg | ORAL_TABLET | Freq: Every day | ORAL | Status: DC
  Administered 2015-08-12: 20 mg via ORAL
  Filled 2015-08-12: qty 1

## 2015-08-12 MED ORDER — PSEUDOEPHEDRINE HCL ER 120 MG PO TB12: 120.0000 mg | ORAL_TABLET | Freq: Two times a day (BID) | ORAL | Status: DC

## 2015-08-12 MED ORDER — FAMOTIDINE 40 MG PO TABS: 40.0000 mg | ORAL_TABLET | Freq: Every day | ORAL | Status: DC

## 2015-08-12 MED ORDER — PREDNISONE 10 MG PO TABS
20.0000 mg | ORAL_TABLET | Freq: Every day | ORAL | Status: DC
Start: 2015-08-12 — End: 2015-09-02

## 2015-08-12 MED ORDER — FLUCONAZOLE 150 MG PO TABS
150.0000 mg | ORAL_TABLET | Freq: Once | ORAL | Status: AC
  Administered 2015-08-12: 150 mg via ORAL
  Filled 2015-08-12: qty 1

## 2015-08-12 MED ORDER — FERROUS SULFATE 325 (65 FE) MG PO TABS: 325.0000 mg | ORAL_TABLET | Freq: Two times a day (BID) | ORAL | Status: DC

## 2015-08-12 MED ORDER — FERROUS SULFATE 325 (65 FE) MG PO TABS
325.0000 mg | ORAL_TABLET | Freq: Two times a day (BID) | ORAL | Status: DC
  Administered 2015-08-12: 325 mg via ORAL
  Filled 2015-08-12: qty 1

## 2015-08-12 MED ORDER — SALINE SPRAY 0.65 % NA SOLN: 1.0000 | Freq: Four times a day (QID) | NASAL | Status: DC

## 2015-08-12 MED ORDER — LORATADINE 10 MG PO TABS: 10.0000 mg | ORAL_TABLET | Freq: Every day | ORAL | Status: DC

## 2015-08-12 NOTE — Progress Notes (Signed)
Patient being taken out of hospital for discharge via wheelchair.

## 2015-08-12 NOTE — Telephone Encounter (Signed)
Message received from Jiles CrockerBrenda Chandler, RN CM inquiring if the patient is eligible for the [facility]itional [facility] ( TCC). After review of the record if was determined that she does not qualify for the TCC; but  [facility] @ 1000 and the information was placed on the AVS.   Update provided to B. Ave Filterhandler, RN CM

## 2015-08-12 NOTE — Progress Notes (Signed)
Oxygen saturations checked while ambulating. Patient 95% on room air while ambulating. Patient tolerated well.

## 2015-08-12 NOTE — Telephone Encounter (Signed)
Spoke with the pt and advised that the form will be signed when VS returns to the office on 08/13/15 She verbalized understanding  Will forward to JR for f/u

## 2015-08-12 NOTE — Discharge Summary (Signed)
Physician Discharge Summary  Kathleen Rodriguez ZOX:[id number] DOB: [birth year] DOA: 08/09/2015  PCP: No PCP Per Patient  Admit date: 08/09/2015 Discharge date: 08/12/2015  Recommendations for Outpatient Follow-up:  1. Pt will need to follow up with PCP in 2 weeks post discharge   Discharge Diagnoses:   Acute respiratory failure with hypoxia -Secondary to asthma exacerbation -patient also has underlying obstructive sleep apnea -Continue bronchodilators (nebs) st home -now stable on RA -Ambulation pulse oximetry did not reveal any oxygen desaturation Asthma exacerbation -The patient is wheezing appears to be mostly upper airway may be due to vocal cord dysfunction -08/11/2015--switch to po steroids -Continue bronchodilators -Continue budesonide--home with budesonide nebulizer twice a day -Appreciate pulmonary--discussed with Dr. Craige Cotta  -Home with prednisone taper for the next 7 days Upper airway cough syndrome with irritant/allergic rhino-sinusitis. -continue flonase, Sudafed, loratidine -Afrin x 2 more days only Impaired glucose tolerance -Elevated CBGs secondary to steroids -Improved as steroids are weaned -07/26/2015 hemoglobin A1c 5.8 -Start NovoLog sliding scale Iron deficiency anemia -The patient was noncompliant with ferrous sulfate at home -Iron saturation 5%, ferritin 15 -given a dose of nulecit -restart ferrous sulfate 325 mg bid Elevated blood pressure -Monitor for now without starting any agents Hypokalemia -Repleted OSA -hs CPAP Discharge Condition: stable  Disposition: home Follow-up Information    Follow up with PARRETT,TAMMY, NP On 08/24/2015.   Specialty:  Pulmonary Disease   Why:  Lung doctor appointment at 2:15 pm.   Contact information:   [address] [place] [address] [phone]       Diet:heart healthy Wt Readings from Last 3 Encounters:  08/12/15 121.174 kg (267 lb 2.2 oz)  07/24/15 120 kg (264 lb 8.8 oz)  12/28/14 126.871 kg  (279 lb 11.2 oz)    History of present illness:  49 year old female with a history of asthma, OSA presented to the emergency department with 2 day history of worsening shortness of breath and wheezing. The patient was recently discharged from the hospital on 07/27/2015 after an episode of acute respiratory failure secondary to asthma exacerbation. The patient was discharged home with prednisone and albuterol nebulizer. The patient endorsed compliance with her medications. The patient states that her children have been sick with what appears to be viral URIs. She has had subjective fevers and chills. The patient was not discharged on any home oxygen. The patient denies any hemoptysis, nausea, vomiting, diarrhea. The patient was started on intravenous steroids. The patient was seen by pulmonology. Her steroids were transitioned to oral prednisone. Her respiratory status improved. She was weaned to room air. The patient will be sent home with prednisone taper.  Consultants: Pulmonology--Dr. Craige Cotta  Discharge Exam: Filed Vitals:   08/12/15 0739  BP:   Pulse: 92  Temp:   Resp: 20   Filed Vitals:   08/11/15 1933 08/12/15 0704 08/12/15 0739 08/12/15 0934  BP: 125/80 138/82    Pulse: 92 78 92   Temp: 98.2 F (36.8 C) 97.7 F (36.5 C)    TempSrc: Oral Oral    Resp: Height:      Weight:  121.174 kg (267 lb 2.2 oz)    SpO2: 100% 100% 95% 95%   General: A&O x 3, NAD, pleasant, cooperative Cardiovascular: RRR, no rub, no gallop, no S3 Respiratory: Bibasilar rales. No wheeze. Abdomen:soft, nontender, nondistended, positive bowel sounds Extremities: No edema, No lymphangitis, no petechiae  Discharge Instructions      Discharge Instructions    Diet - low sodium heart  healthy    Complete by:  As directed      Increase activity slowly    Complete by:  As directed             Medication List    STOP taking these medications        fluconazole 150 MG tablet  Commonly known  as:  DIFLUCAN     HYDROcodone-acetaminophen 5-325 MG tablet  Commonly known as:  NORCO/VICODIN     THERAFLU COLD & COUGH 08-13-19 MG Pack  Generic drug:  Phenylephrine-Pheniramine-DM      TAKE these medications        acetaminophen 500 MG tablet  Commonly known as:  TYLENOL  Take 1,000 mg by mouth every 6 (six) hours as needed for moderate pain.     albuterol (2.5 MG/3ML) 0.083% nebulizer solution  Commonly known as:  PROVENTIL  Take 3 mLs (2.5 mg total) by nebulization every 2 (two) hours as needed for wheezing or shortness of breath.     albuterol 108 (90 BASE) MCG/ACT inhaler  Commonly known as:  VENTOLIN HFA  Inhale 2 puffs into the lungs every 6 (six) hours as needed for wheezing.     budesonide 0.25 MG/2ML nebulizer solution  Commonly known as:  PULMICORT  Take 2 mLs (0.25 mg total) by nebulization 2 (two) times daily.     famotidine 40 MG tablet  Commonly known as:  PEPCID  Take 1 tablet (40 mg total) by mouth at bedtime.     ferrous sulfate 325 (65 FE) MG tablet  Take 1 tablet (325 mg total) by mouth 2 (two) times daily with a meal.     fluticasone 50 MCG/ACT nasal spray  Commonly known as:  FLONASE  Place 2 sprays into the nose daily.     guaiFENesin 100 MG/5ML liquid  Commonly known as:  ROBITUSSIN  Take 200 mg by mouth 3 (three) times daily as needed for cough or congestion.     loratadine 10 MG tablet  Commonly known as:  CLARITIN  Take 1 tablet (10 mg total) by mouth daily.     oxymetazoline 0.05 % nasal spray  Commonly known as:  AFRIN  Place 2 sprays into both nostrils 2 (two) times daily. X 2 days, then bid prn wheezing and congestion     predniSONE 10 MG tablet  Commonly known as:  DELTASONE  Take 2 tablets (20 mg total) by mouth daily with breakfast. X 4 days, then 1 tablet (10 mg) daily x 3 days     pseudoephedrine 120 MG 12 hr tablet  Commonly known as:  SUDAFED  Take 1 tablet (120 mg total) by mouth 2 (two) times daily.     sodium  chloride 0.65 % Soln nasal spray  Commonly known as:  OCEAN  Place 1 spray into both nostrils 4 (four) times daily.     traMADol 50 MG tablet  Commonly known as:  ULTRAM  Take 1 tablet (50 mg total) by mouth every 6 (six) hours as needed.     Vitamin D (Ergocalciferol) 50000 UNITS Caps capsule  Commonly known as:  DRISDOL  Take 1 capsule (50,000 Units total) by mouth every 7 (seven) days. On thursdays.         The results of significant diagnostics from this hospitalization (including imaging, microbiology, ancillary and laboratory) are listed below for reference.    Significant Diagnostic Studies: Dg Chest 2 View  08/09/2015  CLINICAL DATA:  Short of breath. Sharp substernal  chest pain today. Cough. EXAM: CHEST  2 VIEW COMPARISON:  07/24/2015 FINDINGS: The heart size and mediastinal contours are within normal limits. Both lungs are clear. No pleural effusion or pneumothorax. Bony thorax is intact. IMPRESSION: No active cardiopulmonary disease. Electronically Signed   By: Amie Portland M.D.   On: 08/09/2015 20:55   Dg Chest Port 1 View  07/24/2015  CLINICAL DATA:  Acute onset of shortness of breath. Initial encounter. EXAM: PORTABLE CHEST 1 VIEW COMPARISON:  Chest radiograph performed 12/28/2014 FINDINGS: The lungs are well-aerated and clear. There is no evidence of focal opacification, pleural effusion or pneumothorax. The cardiomediastinal silhouette is within normal limits. No acute osseous abnormalities are seen. IMPRESSION: No acute cardiopulmonary process seen. Electronically Signed   By: Roanna Raider M.D.   On: 07/24/2015 19:42     Microbiology: No results found for this or any previous visit (from the past 240 hour(s)).   Labs: Basic Metabolic Panel:  Recent Labs Lab 08/09/15 1850 08/10/15 0110 08/12/15 0207  NA 133* 135 138  K 3.8 3.0* 4.1  CL 103 103 103  CO2 23 18* 29  GLUCOSE 111* 308* 91  BUN CREATININE 0.76 1.07* 0.82  CALCIUM 8.2* 8.3* 8.6*  MG  1.9  --  1.9  PHOS 4.0  --   --    Liver Function Tests:  Recent Labs Lab 08/10/15 0110  AST 34  ALT 27  ALKPHOS 56  BILITOT 0.1*  PROT 6.8  ALBUMIN 3.2*   No results for input(s): LIPASE, AMYLASE in the last 168 hours. No results for input(s): AMMONIA in the last 168 hours. CBC:  Recent Labs Lab 08/09/15 1850 08/10/15 0110  WBC 6.9 9.4  NEUTROABS 3.6 8.9*  HGB 12.4 12.5  HCT 38.7 39.1  MCV 84.9 85.4  PLT 279 288   Cardiac Enzymes:  Recent Labs Lab 08/10/15 0110  TROPONINI <0.03   BNP: Invalid input(s): POCBNP CBG:  Recent Labs Lab 08/11/15 1807 08/11/15 2128 08/12/15 0601  GLUCAP 108* 107* 90    Time coordinating discharge:  Greater than 30 minutes  Signed:  Kaysen Deal, DO [facility] Pager: [phone] 08/12/2015, 9:52 AM

## 2015-08-12 NOTE — Progress Notes (Signed)
Cardiac monitor discontinued.  

## 2015-08-12 NOTE — Telephone Encounter (Signed)
Please have form available for me to complete when I am in office in PM of 08/13/15.

## 2015-08-12 NOTE — Progress Notes (Signed)
Home discharge instructions and discharge medications discussed with patient. Discussed diet, activity, medications and follow up appts. Prescriptions given to patient. Patient able to use teachback to demonstrate proper knowledge of instructions.

## 2015-08-12 NOTE — Telephone Encounter (Signed)
Pt wants to know if her handicapp placard is ready [phone]

## 2015-08-12 NOTE — Progress Notes (Signed)
PCCM PROGRESS NOTE  ADMISSION DATE: 08/09/2015 CONSULT DATE: 08/10/2015 REFERRING PROVIDER: Dr. Carles Collet  CC: Wheezing  SUBJECTIVE: Breathing better.  Feels ready to go home.  OBJECTIVE: BP 138/82 mmHg  Pulse 92  Temp(Src) 97.7 F (36.5 C) (Oral)  Resp 20  Ht 5\' 5"  (1.651 m)  Wt 267 lb 2.2 oz (121.174 kg)  BMI 44.45 kg/m2  SpO2 95%  I/O last 3 completed shifts: In: 800 [P.O.:800] Out: 1600 [Urine:1600]  General: sitting up in bed HEENT: no sinus tenderness, no stridor Cardiac: regular, nom murmur Chest: better air movement, no wheezing Abd: soft, non tender Ext: no edema Neuro: normal strength Skin: no rashes   CMP Latest Ref Rng 08/12/2015 08/10/2015 08/09/2015  Glucose 65 - 99 mg/dL 91 308(H) 111(H)  BUN 6 - 20 mg/dL 14 9 9   Creatinine 0.44 - 1.00 mg/dL 0.82 1.07(H) 0.76  Sodium 135 - 145 mmol/L 138 135 133(L)  Potassium 3.5 - 5.1 mmol/L 4.1 3.0(L) 3.8  Chloride 101 - 111 mmol/L 103 103 103  CO2 22 - 32 mmol/L 29 18(L) 23  Calcium 8.9 - 10.3 mg/dL 8.6(L) 8.3(L) 8.2(L)  Total Protein 6.5 - 8.1 g/dL - 6.8 -  Total Bilirubin 0.3 - 1.2 mg/dL - 0.1(L) -  Alkaline Phos 38 - 126 U/L - 56 -  AST 15 - 41 U/L - 34 -  ALT 14 - 54 U/L - 27 -     CBC Latest Ref Rng 08/10/2015 08/09/2015 07/25/2015  WBC 4.0 - 10.5 K/uL 9.4 6.9 10.3  Hemoglobin 12.0 - 15.0 g/dL 12.5 12.4 12.9  Hematocrit 36.0 - 46.0 % 39.1 38.7 39.8  Platelets 150 - 400 K/uL 288 279 275     Dg Chest 2 View  08/09/2015  CLINICAL DATA:  Short of breath. Sharp substernal chest pain today. Cough. EXAM: CHEST  2 VIEW COMPARISON:  07/24/2015 FINDINGS: The heart size and mediastinal contours are within normal limits. Both lungs are clear. No pleural effusion or pneumothorax. Bony thorax is intact. IMPRESSION: No active cardiopulmonary disease. Electronically Signed   By: Lajean Manes M.D.   On: 08/09/2015 20:55     STUDIES: 02/05/13 CT chest >> no PE, no acute findings 02/11/13 Labs >> ANA negative, Anti-GBM  Ab < 1, ANCA negative PSG 04/19/13 >> AHI 16.4, SpO2 low 84%. PFT 05/15/13 >> FEV1 1.90 (76%), FEV1% 72, TLC 4.23 (81%), DLCO 89%, no BD Echo 04/06/14 >> mod LVH, EF 65 to 70%  EVENTS: 10/16 Admit 10/18 Transition to prednisone  DISCUSSION: 49 yo female former smoker admitted with recurrent wheezing, dyspnea.  This is likely combination of irritant/allergy induced rhino-sinusitis with post nasal drip and upper airway cough syndrome, laryngopharyngeal reflux, asthma exacerbation, and obesity with deconditioning.  She also has hx of OSA on CPAP, and persistent dyspnea/fatigue after influenza PNA in April 2014.  She has improvement with augmentation in sinus regimen.  ASSESSMENT/PLAN:  Asthma exacerbation. Plan: - change to 20 mg prednisone on 10/18 and wean off as tolerated over next 1 week - continue pulmicort, albuterol by nebulizer to limit upper airway irritation >> will need to ensure she has nebulizer and medications at home prior to discharge  Upper airway cough syndrome with irritant/allergic rhino-sinusitis. Plan: - continue nasal irrigation, flonase, claritin - continue scheduled afrin, sudafed until 10/21 >> then prn  - might need additional allergy testing as outpt  Laryngopharyngeal reflux. Plan: - continue protonix, pepcid until re-assessed as outpt  Obstructive sleep apnea. Plan: - CPAP qhs  Dyspnea. Plan: -  assess for home oxygen prior to discharge >> explained to her role of supplemental oxygen and difference between subjective dyspnea and objective findings of exertional hypoxemia  SUMMARY: Okay for d/c home.  I have scheduled her for pulmonary office follow up visit on 08/24/15 at 2:15 pm with Tammy Parrett.  D/w Dr. Carles Collet.   Chesley Mires, MD [facility] Pulmonary/Critical Care 08/12/2015, 9:32 AM Pager:  [phone] After 3pm call: [phone]

## 2015-08-12 NOTE — Telephone Encounter (Signed)
Handicap placard has been placed in VS look at. Will route message to Judiessia to make her aware.

## 2015-08-13 NOTE — Telephone Encounter (Signed)
Contacted pt, made aware her parking placard was completed. Pt states she will come pick up tomorrow morning. Nothing further needed.

## 2015-08-19 ENCOUNTER — Ambulatory Visit: Payer: Self-pay | Attending: Family Medicine | Admitting: Family Medicine

## 2015-08-19 ENCOUNTER — Encounter: Payer: Self-pay | Admitting: Family Medicine

## 2015-08-19 ENCOUNTER — Ambulatory Visit (HOSPITAL_BASED_OUTPATIENT_CLINIC_OR_DEPARTMENT_OTHER): Payer: Self-pay | Admitting: Clinical

## 2015-08-19 VITALS — BP 120/88 | HR 82 | Temp 97.6°F | Resp 18 | Ht 66.0 in | Wt 270.0 lb

## 2015-08-19 DIAGNOSIS — Z91199 Patient's noncompliance with other medical treatment and regimen due to unspecified reason: Secondary | ICD-10-CM | POA: Insufficient documentation

## 2015-08-19 DIAGNOSIS — G4733 Obstructive sleep apnea (adult) (pediatric): Secondary | ICD-10-CM

## 2015-08-19 DIAGNOSIS — Z9119 Patient's noncompliance with other medical treatment and regimen: Secondary | ICD-10-CM

## 2015-08-19 DIAGNOSIS — M25561 Pain in right knee: Secondary | ICD-10-CM

## 2015-08-19 DIAGNOSIS — J45998 Other asthma: Secondary | ICD-10-CM

## 2015-08-19 DIAGNOSIS — I10 Essential (primary) hypertension: Secondary | ICD-10-CM

## 2015-08-19 DIAGNOSIS — Z659 Problem related to unspecified psychosocial circumstances: Secondary | ICD-10-CM

## 2015-08-19 MED ORDER — MELOXICAM 7.5 MG PO TABS: 7.5000 mg | ORAL_TABLET | Freq: Every day | ORAL | Status: DC

## 2015-08-19 NOTE — Progress Notes (Signed)
CC: Follow-up from hospitalization (08/09/15-08/12/15)  HPI: Kathleen Rodriguez is a 49 y.o. female with a history of asthma, obstructive sleep apnea who was recently hospitalized at [facility] for acute respiratory failure with hypoxia secondary to asthma exacerbation.  She had been discharged 2 weeks prior to most recent presentation after being treated for acute respiratory failure and was discharged with a steroid taper but again presented with cough, shortness of breath and wheezing. She was admitted and placed on intravenous steroids; pulmonary consult was placed. Her condition was thought to be secondary to a combination of irritant/allergy-induced rhinosinusitis with postnasal drip and upper airway cough syndrome, laryngopharyngeal reflux, asthma exacerbation, obesity with deconditioning.  Chest x-ray revealed no acute cardiopulmonary disease. She was placed on antihistamine, nasal decongestant and also commenced on Pulmicort nebulizer. Her condition improved and she was subsequently discharged with a steroid taper and advised to follow-up with pulmonary outpatient and with [facility].     Interim history: She complains of runny nose, postnasal drip and mild sinus pressure. Admits that she is yet to pick up all the medication she was prescribed at discharge 1 week ago due to financial constraints. Her symptoms have worsened ever since [place] from [place] and her asthma is triggered by perfume and allergens. She also complains of right knee pain and states she feels her knee pop when she walks; was told in the past she had arthritis.  Patient has No headache, No chest pain, No abdominal pain - No Nausea, No new weakness tingling or numbness, No Cough - SOB.  Allergies  Allergen Reactions  . Latex Itching and Rash  . Other Hives and Itching    Ketchup  . Tomato Hives and Itching  . Wool Alcohol [Lanolin] Itching   Past Medical History    Diagnosis Date  . Arthritis   . Asthma   . Hypertension   . Influenza B April 2014    Complicated by multi-organ failure  . 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 OSA (obstructive sleep apnea) 03/20/2013   Current Outpatient Prescriptions on File Prior to Visit  Medication Sig Dispense Refill  . acetaminophen (TYLENOL) 500 MG tablet Take 1,000 mg by mouth every 6 (six) hours as needed for moderate pain.    Marland Kitchen albuterol (PROVENTIL) (2.5 MG/3ML) 0.083% nebulizer solution Take 3 mLs (2.5 mg total) by nebulization every 2 (two) hours as needed for wheezing or shortness of breath. 75 mL 12  . albuterol (VENTOLIN HFA) 108 (90 BASE) MCG/ACT inhaler Inhale 2 puffs into the lungs every 6 (six) hours as needed for wheezing. 1 Inhaler 2  . budesonide (PULMICORT) 0.25 MG/2ML nebulizer solution Take 2 mLs (0.25 mg total) by nebulization 2 (two) times daily. 60 mL 2  . famotidine (PEPCID) 40 MG tablet Take 1 tablet (40 mg total) by mouth at bedtime. 15 tablet 0  . ferrous sulfate 325 (65 FE) MG tablet Take 1 tablet (325 mg total) by mouth 2 (two) times daily with a meal. 60 tablet 1  . fluticasone (FLONASE) 50 MCG/ACT nasal spray Place 2 sprays into the nose daily. 16 g 0  . guaiFENesin (ROBITUSSIN) 100 MG/5ML liquid Take 200 mg by mouth 3 (three) times daily as needed for cough or congestion.    Marland Kitchen loratadine (CLARITIN) 10 MG tablet Take 1 tablet (10 mg total) by mouth daily. 30 tablet 0  . oxymetazoline (AFRIN) 0.05 % nasal spray Place 2 sprays into both nostrils 2 (two)  times daily. X 2 days, then bid prn wheezing and congestion 14.7 mL 0  . predniSONE (DELTASONE) 10 MG tablet Take 2 tablets (20 mg total) by mouth daily with breakfast. X 4 days, then 1 tablet (10 mg) daily x 3 days 10 tablet 0  . pseudoephedrine (SUDAFED) 120 MG 12 hr tablet Take 1 tablet (120 mg total) by mouth 2 (two) times daily. 15 tablet 0  . sodium chloride (OCEAN) 0.65 % SOLN nasal spray Place 1 spray into both nostrils 4 (four)  times daily.  0  . traMADol (ULTRAM) 50 MG tablet Take 1 tablet (50 mg total) by mouth every 6 (six) hours as needed. 15 tablet 0  . Vitamin D, Ergocalciferol, (DRISDOL) 50000 UNITS CAPS capsule Take 1 capsule (50,000 Units total) by mouth every 7 (seven) days. On thursdays. 8 capsule 0   No current facility-administered medications on file prior to visit.   Family History  Problem Relation Age of Onset  . Hypertension Mother   . HIV/AIDS Father    Social History   Social History  . Marital Status: Married    Spouse Name: N/A  . Number of Children: N/A  . Years of Education: N/A   Occupational History  . Not on file.   Social History Main Topics  . Smoking status: Former Smoker -- 0.25 packs/day for 20 years    Types: Cigarettes    Quit date: 01/31/2013  . Smokeless tobacco: Never Used  . Alcohol Use: No  . Drug Use: No  . Sexual Activity: Yes    Birth Control/ Protection: None, Surgical   Other Topics Concern  . Not on file   Social History Narrative    Review of Systems: Constitutional: Negative for fever, chills, diaphoresis, activity change, appetite change and fatigue. HENT: See history of present illness  Eyes: Negative for pain, discharge, redness, itching and visual disturbance. Respiratory: Negative for cough, choking, chest tightness, shortness of breath, wheezing and stridor.  Cardiovascular: Negative for chest pain, palpitations and leg swelling. Gastrointestinal: Negative for abdominal distention. Genitourinary: Negative for dysuria, urgency, frequency, hematuria, flank pain, decreased urine volume, difficulty urinating and dyspareunia.  Musculoskeletal: See history of present illness. Neurological: Negative for dizziness, tremors, seizures, syncope, facial asymmetry, speech difficulty, weakness, light-headedness, numbness and headaches.  Hematological: Negative for adenopathy. Does not bruise/bleed easily. Psychiatric/Behavioral: Negative for  hallucinations, behavioral problems, confusion, dysphoric mood, decreased concentration and agitation.    Objective:   Filed Vitals:   08/19/15 0952  BP: 120/88  Pulse: 82  Temp: 97.6 F (36.4 C)  Resp: 18    Physical Exam: Constitutional: Patient appears obese, well-developed and well-nourished. No distress. HENT: Normocephalic, atraumatic, External right and left ear normal. Oropharynx is clear and moist.  Eyes: Conjunctivae and EOM are normal. PERRLA, no scleral icterus. Neck: Normal ROM. Neck supple. No JVD. No tracheal deviation. No thyromegaly. CVS: RRR, S1/S2 +, no murmurs, no gallops, no carotid bruit.  Pulmonary: Effort and breath sounds normal, no stridor, rhonchi, wheezes, rales.  Abdominal: Soft. BS +,  no distension, tenderness, rebound or guarding.  Musculoskeletal: Normal range of motion in right knee, no edema, mild tenderness on range of motion and palpation.  Lymphadenopathy: No lymphadenopathy noted, cervical, inguinal or axillary Neuro: Alert. Normal reflexes, muscle tone coordination. No cranial nerve deficit. Skin: Skin is warm and dry. No rash noted. Not diaphoretic. No erythema. No pallor. Psychiatric: Normal mood and affect. Behavior, judgment, thought content normal.  Lab Results  Component Value Date  WBC 9.4 08/10/2015   HGB 12.5 08/10/2015   HCT 39.1 08/10/2015   MCV 85.4 08/10/2015   PLT 288 08/10/2015   Lab Results  Component Value Date   CREATININE 0.82 08/12/2015   BUN 14 08/12/2015   NA 138 08/12/2015   K 4.1 08/12/2015   CL 103 08/12/2015   CO2 29 08/12/2015    Lab Results  Component Value Date   HGBA1C 5.8* 07/26/2015   Lipid Panel     Component Value Date/Time   TRIG 158* 02/10/2013 0450       Assessment and plan:   Uncontrolled persistent asthma with acute exacerbation: Poor compliance with medication largely contributory to patient's symptoms. Will speak with the pharmacy who will work with the patient to allow her  to pick up her medications; she has also been informed that the pharmacy will always work with her regarding payment options.  Allergic rhinitis vs rhino-sinusitis: Wors[place]. I have advised her to pickup her nasal sprays from the pharmacy as they could explain her rhinorrhea which she is having  Right knee pain: Likely due to underlying early osteoarthritis. Placed on anti-inflammatory.  Hypertension: Blood pressure was previously elevated during hospitalization at has normalized now. Encouraged on lifestyle changes which include exercise, low-sodium and DASH diet.    Jaclyn ShaggyEnobong, Amao, MD. [facility] [phone] 08/19/2015, 10:29 AM

## 2015-08-19 NOTE — Progress Notes (Signed)
ASSESSMENT: Pt currently experiencing psychosocial problems, needs to establish care with PCP; would benefit from community resources and supportive counseling regarding coping with psychosocial circumstances.  Stage of Change: contemplative  PLAN: 1. F/U with behavioral health consultant in as needed 2. Psychiatric Medications: none. 3. Behavioral recommendation(s):   -Fill out [facility] application -Call [facility] to make an appointment -Consider community resources to help with utility bills -Consider community food resources -Continue with financial counseling appointment SUBJECTIVE: Pt. referred by Dr Venetia NightAmao for community resources:  Pt. reports the following symptoms/concerns: Pt says she is stressed over not having insurance, not having finances to pay for medications, unreliable transportation, inadequate food resources, needs help with utilites. Husband lost job "awhile back" along with insurance.  Duration of problem: Greatest concern one week (since leaving hospital) Severity: moderate  OBJECTIVE: Orientation & Cognition: Oriented x3. Thought processes normal and appropriate to situation. Mood: appropriate. Affect: approptiate Appearance: appropriate Risk of harm to self or others: no known risk of harm to self or others Substance use: none Assessments administered: PHQ9: 10/ GAD7: 4  Diagnosis: Problem with psychosocial circumstances CPT Code: Z65.9 -------------------------------------------- Other(s) present in the room: none  Time spent with patient in exam room: 16 minutes

## 2015-08-19 NOTE — Progress Notes (Signed)
Pt's here for HFU for asthma. Pt c/o pain in both knees rated 8/10. Described pain as a burning sensation off and on.  Pt reports doing asthma tx this morning and nasal spray. No other meds taken this morning.

## 2015-08-19 NOTE — Patient Instructions (Signed)
Asthma, Adult Asthma is a recurring condition in which the airways tighten and narrow. Asthma can make it difficult to breathe. It can cause coughing, wheezing, and shortness of breath. Asthma episodes, also called asthma attacks, range from minor to life-threatening. Asthma cannot be cured, but medicines and lifestyle changes can help control it. CAUSES Asthma is believed to be caused by inherited (genetic) and environmental factors, but its exact cause is unknown. Asthma may be triggered by allergens, lung infections, or irritants in the air. Asthma triggers are different for each person. Common triggers include:   Animal dander.  Dust mites.  Cockroaches.  Pollen from trees or grass.  Mold.  Smoke.  Air pollutants such as dust, household cleaners, hair sprays, aerosol sprays, paint fumes, strong chemicals, or strong odors.  Cold air, weather changes, and winds (which increase molds and pollens in the air).  Strong emotional expressions such as crying or laughing hard.  Stress.  Certain medicines (such as aspirin) or types of drugs (such as beta-blockers).  Sulfites in foods and drinks. Foods and drinks that may contain sulfites include dried fruit, potato chips, and sparkling grape juice.  Infections or inflammatory conditions such as the flu, a cold, or an inflammation of the nasal membranes (rhinitis).  Gastroesophageal reflux disease (GERD).  Exercise or strenuous activity. SYMPTOMS Symptoms may occur immediately after asthma is triggered or many hours later. Symptoms include:  Wheezing.  Excessive nighttime or early morning coughing.  Frequent or severe coughing with a common cold.  Chest tightness.  Shortness of breath. DIAGNOSIS  The diagnosis of asthma is made by a review of your medical history and a physical exam. Tests may also be performed. These may include:  Lung function studies. These tests show how much air you breathe in and out.  Allergy  tests.  Imaging tests such as X-rays. TREATMENT  Asthma cannot be cured, but it can usually be controlled. Treatment involves identifying and avoiding your asthma triggers. It also involves medicines. There are 2 classes of medicine used for asthma treatment:   Controller medicines. These prevent asthma symptoms from occurring. They are usually taken every day.  Reliever or rescue medicines. These quickly relieve asthma symptoms. They are used as needed and provide short-term relief. Your health care provider will help you create an asthma action plan. An asthma action plan is a written plan for managing and treating your asthma attacks. It includes a list of your asthma triggers and how they may be avoided. It also includes information on when medicines should be taken and when their dosage should be changed. An action plan may also involve the use of a device called a peak flow meter. A peak flow meter measures how well the lungs are working. It helps you monitor your condition. HOME CARE INSTRUCTIONS   Take medicines only as directed by your health care provider. Speak with your health care provider if you have questions about how or when to take the medicines.  Use a peak flow meter as directed by your health care provider. Record and keep track of readings.  Understand and use the action plan to help minimize or stop an asthma attack without needing to seek medical care.  Control your home environment in the following ways to help prevent asthma attacks:  Do not smoke. Avoid being exposed to secondhand smoke.  Change your heating and air conditioning filter regularly.  Limit your use of fireplaces and wood stoves.  Get rid of pests (such as roaches   and mice) and their droppings.  Throw away plants if you see mold on them.  Clean your floors and dust regularly. Use unscented cleaning products.  Try to have someone else vacuum for you regularly. Stay out of rooms while they are  being vacuumed and for a short while afterward. If you vacuum, use a dust mask from a hardware store, a double-layered or microfilter vacuum cleaner bag, or a vacuum cleaner with a HEPA filter.  Replace carpet with wood, tile, or vinyl flooring. Carpet can trap dander and dust.  Use allergy-proof pillows, mattress covers, and box spring covers.  Wash bed sheets and blankets every week in hot water and dry them in a dryer.  Use blankets that are made of polyester or cotton.  Clean bathrooms and kitchens with bleach. If possible, have someone repaint the walls in these rooms with mold-resistant paint. Keep out of the rooms that are being cleaned and painted.  Wash hands frequently. SEEK MEDICAL CARE IF:   You have wheezing, shortness of breath, or a cough even if taking medicine to prevent attacks.  The colored mucus you cough up (sputum) is thicker than usual.  Your sputum changes from clear or white to yellow, green, gray, or bloody.  You have any problems that may be related to the medicines you are taking (such as a rash, itching, swelling, or trouble breathing).  You are using a reliever medicine more than 2-3 times per week.  Your peak flow is still at 50-79% of your personal best after following your action plan for 1 hour.  You have a fever. SEEK IMMEDIATE MEDICAL CARE IF:   You seem to be getting worse and are unresponsive to treatment during an asthma attack.  You are short of breath even at rest.  You get short of breath when doing very little physical activity.  You have difficulty eating, drinking, or talking due to asthma symptoms.  You develop chest pain.  You develop a fast heartbeat.  You have a bluish color to your lips or fingernails.  You are light-headed, dizzy, or faint.  Your peak flow is less than 50% of your personal best.   This information is not intended to replace advice given to you by your health care provider. Make sure you discuss any  questions you have with your health care provider.   Document Released: 10/10/2005 Document Revised: 07/01/2015 Document Reviewed: 05/09/2013 Elsevier Interactive Patient Education 2016 [employer]  

## 2015-08-20 ENCOUNTER — Ambulatory Visit: Payer: Self-pay

## 2015-08-24 ENCOUNTER — Inpatient Hospital Stay: Payer: Self-pay | Admitting: Adult Health

## 2015-09-02 ENCOUNTER — Encounter: Payer: Self-pay | Admitting: Adult Health

## 2015-09-02 ENCOUNTER — Ambulatory Visit (INDEPENDENT_AMBULATORY_CARE_PROVIDER_SITE_OTHER): Payer: Self-pay | Admitting: Adult Health

## 2015-09-02 VITALS — BP 138/88 | HR 77 | Temp 97.5°F | Ht 66.0 in | Wt 269.0 lb

## 2015-09-02 DIAGNOSIS — J45901 Unspecified asthma with (acute) exacerbation: Secondary | ICD-10-CM

## 2015-09-02 DIAGNOSIS — R05 Cough: Secondary | ICD-10-CM

## 2015-09-02 DIAGNOSIS — G4733 Obstructive sleep apnea (adult) (pediatric): Secondary | ICD-10-CM

## 2015-09-02 DIAGNOSIS — R058 Other specified cough: Secondary | ICD-10-CM

## 2015-09-02 NOTE — Assessment & Plan Note (Signed)
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 For cough Continue on budesonide nebulizer twice daily, rinse after use. Use albuterol nebulizer as rescue only. For wheezing and shortness of breath. Follow-up with community [facility] wellness tomorrow as planned Followup with Dr. Craige CottaSood in 2 months and As needed

## 2015-09-02 NOTE — Progress Notes (Signed)
Subjective:    Patient ID: Kathleen Rodriguez, female    DOB: 09-05-1966, 49 y.o.   MRN: [id number]  HPI 49 yo former smoker with chronic cough, upper airway cough syndrome, possible asthma, OSA,      09/02/2015 [facility] follow up  Patient presents for a post hospital followup. Admitted couple of weeks ago asthma exacerbation vs decompensated VCD /LPR/UACS  Cardiac enzymes and bnp  were negative. Influenza is neg .  Patient was treated with IV antibiotics, and steroids, and nebulized bronchodilators.  Chest x-ray shows no acute process. Patient was discharged on pred taper .  Since discharge. Does not feel significantly better. Continues to have intermittent shortness, of breath with activity, hoarseness, drainage and intermittent wheezing .  She is getting rx thru [facility].  She is now on Budesonide Neb Twice daily  . She is taking albuterol neb but using it with budesonide.  We discussed using albuterol As needed  .  Denies chest tightness/congestion, hemoptysis, fever, and nausea. Patient denies any hemoptysis, orthopnea, PND, or leg swelling. She is on CPAP for OSA , admits does not wear it regularly . We disucssed compliance and wt loss.  Does not want to take pepcid , causes her to feel weird.    TESTS: 02/05/13 CT chest >> no PE, no acute findings 02/11/13 Labs >> ANA negative, Anti-GBM Ab < 1, ANCA negative PSG 04/19/13 >> AHI 16.4, SpO2 low 84%. PFT 05/15/13 >> FEV1 1.90 (76%), FEV1% 72, TLC 4.23 (81%), DLCO 89%, no BD Echo 04/06/14 >> mod LVH, EF 65 to 70%    Review of Systems Constitutional:   No  weight loss, night sweats,  Fevers, chills,  +fatigue, or  lassitude.  HEENT:   No headaches,  Difficulty swallowing,  Tooth/dental problems, or  Sore throat,                No sneezing, itching, ear ache, ++ nasal congestion, post nasal drip,   CV:  No chest pain,  Orthopnea, PND, swelling in lower extremities, anasarca, dizziness, palpitations,  syncope.   GI  No heartburn, indigestion, abdominal pain, nausea, vomiting, diarrhea, change in bowel habits, loss of appetite, bloody stools.   Resp:  No coughing up of blood.  No change in color of mucus.  No wheezing.  No chest wall deformity  Skin: no rash or lesions.  GU: no dysuria, change in color of urine, no urgency or frequency.  No flank pain, no hematuria   MS:  No joint pain or swelling.  No decreased range of motion.  +back pain.  Psych:  No change in mood or affect. No depression or anxiety.  No memory loss.         Objective:   Physical Exam GEN: A/Ox3; pleasant , NAD, obese   HEENT:  [place]/AT,  EACs-clear, TMs-wnl, NOSE-clear drainage  THROAT-clear, no lesions, no postnasal drip or exudate noted. Class 2 MP airway . No stridor   NECK:  Supple w/ fair ROM; no JVD; normal carotid impulses w/o bruits; no thyromegaly or nodules palpated; no lymphadenopathy.  RESP  Clear  P & A; w/o, wheezes/ rales/ or rhonchi.no accessory muscle use, no dullness to percussion  CARD:  RRR, no m/r/g  , no peripheral edema, pulses intact, no cyanosis or clubbing.  GI:   Soft & nt; nml bowel sounds; no organomegaly or masses detected.  Musco: Warm bil, no deformities or joint swelling noted.   Neuro: alert, no focal deficits noted.  Skin: Warm, no lesions or rashes         Assessment & Plan:

## 2015-09-02 NOTE — Assessment & Plan Note (Addendum)
?  VCD/UACS +/- asthma component w/ GERD/AR triggers   Plan  Change Pepcid to Zantac 150 mg at bedtime  Reflux diet Avoid all Mint  products Use Delsym 2 teaspoons twice daily as needed. For cough Continue on budesonide nebulizer twice daily, rinse after use. Use albuterol nebulizer as rescue only. For wheezing and shortness of breath.   Follow-up with [facility] tomorrow as planned Followup with Dr. Craige CottaSood in 2 months and As needed

## 2015-09-02 NOTE — Addendum Note (Signed)
Addended by: Karalee HeightOX, Karin Griffith P on: 09/02/2015 10:45 AM   Modules accepted: Orders, Medications

## 2015-09-02 NOTE — Patient Instructions (Addendum)
Change Pepcid to Zantac 150 mg at bedtime  Reflux diet Avoid all Mint  products Use Delsym 2 teaspoons twice daily as needed. For cough Continue on budesonide nebulizer twice daily, rinse after use. Use albuterol nebulizer as rescue only. For wheezing and shortness of breath. Continue on CPAP at bedtime. Goal is to wear at least 6 hours each night Work on weight loss Follow-up with [facility] tomorrow as planned Followup with Dr. Craige CottaSood in 2 months and As needed

## 2015-09-02 NOTE — Assessment & Plan Note (Signed)
Encouraged on CPAP compliance   Plan  Continue on CPAP at bedtime. Goal is to wear at least 6 hours each night Work on weight loss Follow-up with [facility] tomorrow as planned Followup with Dr. Craige CottaSood in 2 months and As needed

## 2015-09-02 NOTE — Assessment & Plan Note (Signed)
Wt loss  

## 2015-09-03 ENCOUNTER — Ambulatory Visit: Payer: Self-pay

## 2015-09-03 NOTE — Progress Notes (Signed)
Reviewed and agree with assessment/plan. 

## 2015-09-09 ENCOUNTER — Ambulatory Visit: Payer: Self-pay | Admitting: Family Medicine

## 2015-11-04 ENCOUNTER — Encounter (HOSPITAL_COMMUNITY): Payer: Self-pay | Admitting: Emergency Medicine

## 2015-11-04 ENCOUNTER — Emergency Department (HOSPITAL_COMMUNITY)
Admission: EM | Admit: 2015-11-04 | Discharge: 2015-11-04 | Disposition: A | Discharge status: Home / Self Care | Payer: Self-pay | Attending: Emergency Medicine | Admitting: Emergency Medicine

## 2015-11-04 DIAGNOSIS — Z8709 Personal history of other diseases of the respiratory system: Secondary | ICD-10-CM | POA: Insufficient documentation

## 2015-11-04 DIAGNOSIS — I10 Essential (primary) hypertension: Secondary | ICD-10-CM | POA: Insufficient documentation

## 2015-11-04 DIAGNOSIS — A599 Trichomoniasis, unspecified: Secondary | ICD-10-CM

## 2015-11-04 DIAGNOSIS — G8929 Other chronic pain: Secondary | ICD-10-CM | POA: Insufficient documentation

## 2015-11-04 DIAGNOSIS — Z87891 Personal history of nicotine dependence: Secondary | ICD-10-CM | POA: Insufficient documentation

## 2015-11-04 DIAGNOSIS — Z79899 Other long term (current) drug therapy: Secondary | ICD-10-CM | POA: Insufficient documentation

## 2015-11-04 DIAGNOSIS — Z8669 Personal history of other diseases of the nervous system and sense organs: Secondary | ICD-10-CM | POA: Insufficient documentation

## 2015-11-04 DIAGNOSIS — A5901 Trichomonal vulvovaginitis: Secondary | ICD-10-CM | POA: Insufficient documentation

## 2015-11-04 DIAGNOSIS — M1990 Unspecified osteoarthritis, unspecified site: Secondary | ICD-10-CM | POA: Insufficient documentation

## 2015-11-04 DIAGNOSIS — M25569 Pain in unspecified knee: Secondary | ICD-10-CM

## 2015-11-04 DIAGNOSIS — Z91040 Latex allergy status: Secondary | ICD-10-CM | POA: Insufficient documentation

## 2015-11-04 DIAGNOSIS — M25562 Pain in left knee: Secondary | ICD-10-CM | POA: Insufficient documentation

## 2015-11-04 DIAGNOSIS — M25561 Pain in right knee: Secondary | ICD-10-CM | POA: Insufficient documentation

## 2015-11-04 DIAGNOSIS — J45909 Unspecified asthma, uncomplicated: Secondary | ICD-10-CM | POA: Insufficient documentation

## 2015-11-04 LAB — URINE MICROSCOPIC-ADD ON

## 2015-11-04 LAB — URINALYSIS, ROUTINE W REFLEX MICROSCOPIC
BILIRUBIN URINE: NEGATIVE
Glucose, UA: NEGATIVE mg/dL
HGB URINE DIPSTICK: NEGATIVE
Ketones, ur: NEGATIVE mg/dL
NITRITE: NEGATIVE
PROTEIN: NEGATIVE mg/dL
SPECIFIC GRAVITY, URINE: 1.034 — AB (ref 1.005–1.030)
pH: 5 (ref 5.0–8.0)

## 2015-11-04 LAB — WET PREP, GENITAL
CLUE CELLS WET PREP: NONE SEEN
SPERM: NONE SEEN
Yeast Wet Prep HPF POC: NONE SEEN

## 2015-11-04 MED ORDER — METRONIDAZOLE 500 MG PO TABS: 500.0000 mg | ORAL_TABLET | Freq: Two times a day (BID) | ORAL | Status: DC

## 2015-11-04 MED ORDER — KETOROLAC TROMETHAMINE 30 MG/ML IJ SOLN
30.0000 mg | Freq: Once | INTRAMUSCULAR | Status: AC
  Administered 2015-11-04: 30 mg via INTRAMUSCULAR
  Filled 2015-11-04: qty 1

## 2015-11-04 NOTE — Discharge Instructions (Signed)
Please read attached information. If you experience any new or worsening signs or symptoms please return to the emergency room for evaluation. Please follow-up with your primary care provider or specialist as discussed. Please use medication prescribed only as directed and discontinue taking if you have any concerning signs or symptoms.   °

## 2015-11-04 NOTE — ED Provider Notes (Signed)
CSN: [id number]     Arrival date & time 11/04/15  1445 History  By signing my name below, I, Kathleen Rodriguez, attest that this documentation has been prepared under the direction and in the presence of [employer], PA-C. Electronically Signed: Ronney Rodriguez, ED Scribe. 11/04/2015. 8:31 PM.    Chief Complaint  Patient presents with  . Knee Pain  . Vaginal Discharge   The history is provided by the patient. No language interpreter was used.   HPI Comments: Kathleen Rodriguez is a 50 y.o. female with a history of arthritis, asthma, and HTN, who presents to the Emergency Department complaining of suprapubic, non-radiating, pressure-like abdominal pain, vaginal malodor, and vaginal itching that began after having sexual intercourse with her husband several days ago. She states she feels suprapubic abdominal pressure with urination but denies burning with urination. Patient states this is the first time she has had intercourse with her husband in a long period of time. She states she did not use sexual protection. She denies any burning with urination, vomiting, or fever.  Patient also complains of gradual-onset, 7/10, aching bilateral knee pain that began several weeks ago, when the cold weather began. She reports a history of arthritis and states her bilateral knee pain flares up whenever the weather becomes cold. Patient's arthritis is followed by [facility]; she is typically given anti-inflammatory medication for her knee pain, although she cannot recall exactly which.      Past Medical History  Diagnosis Date  . Arthritis   . Asthma   . Hypertension   . Influenza B April 2014    Complicated by multi-organ failure  . 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 OSA (obstructive sleep apnea) 03/20/2013   Past Surgical History  Procedure Laterality Date  . Tubal ligation    . Cesarean section     Family History  Problem Relation Age of Onset  . Hypertension Mother   . HIV/AIDS Father     Social History  Substance Use Topics  . Smoking status: Former Smoker -- 0.25 packs/day for 20 years    Types: Cigarettes    Quit date: 01/31/2013  . Smokeless tobacco: Never Used  . Alcohol Use: No   OB History    No data available     Review of Systems A complete 10 system review of systems was obtained and all systems are negative except as noted in the HPI and PMH.    Allergies  Latex; Other; Tomato; and Wool alcohol  Home Medications   Prior to Admission medications   Medication Sig Start Date End Date Taking? Authorizing Provider  acetaminophen (TYLENOL) 500 MG tablet Take 1,000 mg by mouth every 6 (six) hours as needed for moderate pain.    Historical Provider, MD  albuterol (PROVENTIL) (2.5 MG/3ML) 0.083% nebulizer solution Take 3 mLs (2.5 mg total) by nebulization every 2 (two) hours as needed for wheezing or shortness of breath. 07/27/15   Rhetta Mura, MD  albuterol (VENTOLIN HFA) 108 (90 BASE) MCG/ACT inhaler Inhale 2 puffs into the lungs every 6 (six) hours as needed for wheezing. 07/27/15   Rhetta Mura, MD  budesonide (PULMICORT) 0.25 MG/2ML nebulizer solution Take 2 mLs (0.25 mg total) by nebulization 2 (two) times daily. 08/12/15   Catarina Hartshorn, MD  ferrous sulfate 325 (65 FE) MG tablet Take 1 tablet (325 mg total) by mouth 2 (two) times daily with a meal. 08/12/15   Catarina Hartshorn, MD  fluticasone (FLONASE) 50 MCG/ACT nasal spray  Place 2 sprays into the nose daily. 02/21/13   Bernadene PersonKathryn A Whiteheart, NP  guaiFENesin (ROBITUSSIN) 100 MG/5ML liquid Take 200 mg by mouth 3 (three) times daily as needed for cough or congestion.    Historical Provider, MD  loratadine (CLARITIN) 10 MG tablet Take 1 tablet (10 mg total) by mouth daily. 08/12/15   Catarina Hartshornavid Tat, MD  meloxicam (MOBIC) 7.5 MG tablet Take 1 tablet (7.5 mg total) by mouth daily. 08/19/15   Jaclyn ShaggyEnobong Amao, MD  metroNIDAZOLE (FLAGYL) 500 MG tablet Take 1 tablet (500 mg total) by mouth 2 (two) times daily. 11/04/15    Eyvonne MechanicJeffrey Moraima Burd, PA-C  oxymetazoline (AFRIN) 0.05 % nasal spray Place 2 sprays into both nostrils 2 (two) times daily. X 2 days, then bid prn wheezing and congestion 08/12/15   Catarina Hartshornavid Tat, MD  pseudoephedrine (SUDAFED) 120 MG 12 hr tablet Take 1 tablet (120 mg total) by mouth 2 (two) times daily. Patient not taking: Reported on 09/02/2015 08/12/15   Catarina Hartshornavid Tat, MD  ranitidine (ZANTAC) 150 MG capsule Take 150 mg by mouth at bedtime.     Historical Provider, MD  sodium chloride (OCEAN) 0.65 % SOLN nasal spray Place 1 spray into both nostrils 4 (four) times daily. 08/12/15   Catarina Hartshornavid Tat, MD  traMADol (ULTRAM) 50 MG tablet Take 1 tablet (50 mg total) by mouth every 6 (six) hours as needed. Patient not taking: Reported on 09/02/2015 08/12/15   Catarina Hartshornavid Tat, MD  Vitamin D, Ergocalciferol, (DRISDOL) 50000 UNITS CAPS capsule Take 1 capsule (50,000 Units total) by mouth every 7 (seven) days. On thursdays. Patient not taking: Reported on 09/02/2015 12/29/14   Clydia LlanoMutaz Elmahi, MD   BP 144/70 mmHg  Pulse 88  Temp(Src) 97.8 F (36.6 C) (Oral)  Resp 17  SpO2 100%  LMP 10/04/2015   Physical Exam  Constitutional: She is oriented to person, place, and time. She appears well-developed and well-nourished. No distress.  HENT:  Head: Normocephalic and atraumatic.  Eyes: Conjunctivae and EOM are normal.  Neck: Neck supple. No tracheal deviation present.  Cardiovascular: Normal rate.   Pulmonary/Chest: Effort normal. No respiratory distress.  Abdominal: Soft. Bowel sounds are normal. She exhibits no distension and no mass. There is no tenderness. There is no rebound and no guarding.  Genitourinary: There is no rash, tenderness, lesion or injury on the right labia. There is no rash, tenderness, lesion or injury on the left labia. Cervix exhibits no motion tenderness, no discharge and no friability. Right adnexum displays no mass, no tenderness and no fullness. Left adnexum displays no mass, no tenderness and no fullness. No  erythema, tenderness or bleeding in the vagina. No foreign body around the vagina. No signs of injury around the vagina. Vaginal discharge found.  Small amount of sticky white discharge  Musculoskeletal: Normal range of motion.  Knees equal bilateral no signs of swelling, fever, warmth to touch, significant tenderness to palpation.  Full active range of motion.  Neurological: She is alert and oriented to person, place, and time.  Skin: Skin is warm and dry.  Psychiatric: She has a normal mood and affect. Her behavior is normal.  Nursing note and vitals reviewed.   ED Course  Procedures (including critical care time)  DIAGNOSTIC STUDIES: Oxygen Saturation is 100% on RA, normal by my interpretation.    COORDINATION OF CARE: 8:31 PM - Discussed treatment plan with pt at bedside which includes pelvic exam. Advised ibuprofen and Tylenol for arthritic bilateral knee pain. Pt verbalized understanding and agreed to  plan.   Labs Review Labs Reviewed  WET PREP, GENITAL - Abnormal; Notable for the following:    Trich, Wet Prep PRESENT (*)    WBC, Wet Prep HPF POC MODERATE (*)    All other components within normal limits  URINALYSIS, ROUTINE W REFLEX MICROSCOPIC (NOT AT [facility]) - Abnormal; Notable for the following:    APPearance CLOUDY (*)    Specific Gravity, Urine 1.034 (*)    Leukocytes, UA TRACE (*)    All other components within normal limits  URINE MICROSCOPIC-ADD ON - Abnormal; Notable for the following:    Squamous Epithelial / LPF 6-30 (*)    Bacteria, UA FEW (*)    Crystals CA OXALATE CRYSTALS (*)    All other components within normal limits  GC/CHLAMYDIA PROBE AMP ([facility]) NOT AT [facility]    Imaging Review No results found. I have personally reviewed and evaluated these images and lab results as part of my medical decision-making.   EKG Interpretation None      MDM   Final diagnoses:  Trichomoniasis  Chronic knee pain, unspecified laterality   Labs: UA, wet prep,  and reactive chlamydia- trichomoniasis  Imaging:  Consults:  Therapeutics:  Discharge Meds: Metronidazole  Assessment/Plan: 50 year old female presents today with numerous complaints. Patient has bilateral chronic knee pain, this is unchanged, no signs of infection or trauma. Patient is instructed to use ibuprofen or Tylenol as needed for pain follow-up with primary care for further evaluation and management of chronic pain. Patient also has vaginal odor, trichomoniasis on wet prep. Patient has no abdominal tenderness, afebrile, 3 showing vital signs. No indication for reproductive organ infection. Patient will be discharged home with above medications, encouraged follow-up with her primary care for reevaluation. She is instructed to have all sexual partners treated. Patient verbalized understanding and agreement to today's plan and had no further questions concerns at time of discharge   I personally performed the services described in this documentation, which was scribed in my presence. The recorded information has been reviewed and is accurate.     Eyvonne Mechanic, PA-C 11/04/15 2031  Mancel Bale, MD 11/05/15 [phone]

## 2015-11-04 NOTE — ED Notes (Signed)
Pt states she has bilateral knee pain that's stared with cold weather. Also pt states she has a foul vaginal odor and lower abd burning with urination.

## 2015-11-05 LAB — GC/CHLAMYDIA PROBE AMP (~~LOC~~) NOT AT ARMC
Chlamydia: NEGATIVE
Neisseria Gonorrhea: NEGATIVE

## 2015-11-20 ENCOUNTER — Inpatient Hospital Stay (HOSPITAL_COMMUNITY)
Admission: EM | Admit: 2015-11-20 | Discharge: 2015-11-22 | DRG: 202 | Disposition: A | Discharge status: Home / Self Care | Payer: Self-pay | Attending: Internal Medicine | Admitting: Internal Medicine

## 2015-11-20 ENCOUNTER — Emergency Department (HOSPITAL_COMMUNITY): Payer: Self-pay

## 2015-11-20 ENCOUNTER — Encounter (HOSPITAL_COMMUNITY): Payer: Self-pay | Admitting: *Deleted

## 2015-11-20 DIAGNOSIS — K219 Gastro-esophageal reflux disease without esophagitis: Secondary | ICD-10-CM | POA: Diagnosis present

## 2015-11-20 DIAGNOSIS — J45901 Unspecified asthma with (acute) exacerbation: Principal | ICD-10-CM | POA: Diagnosis present

## 2015-11-20 DIAGNOSIS — Z830 Family history of human immunodeficiency virus [HIV] disease: Secondary | ICD-10-CM

## 2015-11-20 DIAGNOSIS — Z87891 Personal history of nicotine dependence: Secondary | ICD-10-CM

## 2015-11-20 DIAGNOSIS — I10 Essential (primary) hypertension: Secondary | ICD-10-CM | POA: Diagnosis present

## 2015-11-20 DIAGNOSIS — D509 Iron deficiency anemia, unspecified: Secondary | ICD-10-CM | POA: Diagnosis present

## 2015-11-20 DIAGNOSIS — G4733 Obstructive sleep apnea (adult) (pediatric): Secondary | ICD-10-CM | POA: Diagnosis present

## 2015-11-20 DIAGNOSIS — Z6841 Body Mass Index (BMI) 40.0 and over, adult: Secondary | ICD-10-CM

## 2015-11-20 DIAGNOSIS — Z8249 Family history of ischemic heart disease and other diseases of the circulatory system: Secondary | ICD-10-CM

## 2015-11-20 LAB — CBC WITH DIFFERENTIAL/PLATELET
BASOS ABS: 0 10*3/uL (ref 0.0–0.1)
BASOS PCT: 1 %
Eosinophils Absolute: 0.5 10*3/uL (ref 0.0–0.7)
Eosinophils Relative: 6 %
HEMATOCRIT: 39.4 % (ref 36.0–46.0)
HEMOGLOBIN: 12.7 g/dL (ref 12.0–15.0)
Lymphocytes Relative: 41 %
Lymphs Abs: 3.3 10*3/uL (ref 0.7–4.0)
MCH: 27.7 pg (ref 26.0–34.0)
MCHC: 32.2 g/dL (ref 30.0–36.0)
MCV: 86 fL (ref 78.0–100.0)
MONOS PCT: 8 %
Monocytes Absolute: 0.7 10*3/uL (ref 0.1–1.0)
NEUTROS ABS: 3.6 10*3/uL (ref 1.7–7.7)
NEUTROS PCT: 44 %
Platelets: 299 10*3/uL (ref 150–400)
RBC: 4.58 MIL/uL (ref 3.87–5.11)
RDW: 15.1 % (ref 11.5–15.5)
WBC: 8.1 10*3/uL (ref 4.0–10.5)

## 2015-11-20 MED ORDER — ALBUTEROL SULFATE (2.5 MG/3ML) 0.083% IN NEBU
5.0000 mg | INHALATION_SOLUTION | Freq: Once | RESPIRATORY_TRACT | Status: AC
  Administered 2015-11-20: 5 mg via RESPIRATORY_TRACT

## 2015-11-20 MED ORDER — IPRATROPIUM-ALBUTEROL 0.5-2.5 (3) MG/3ML IN SOLN
3.0000 mL | Freq: Once | RESPIRATORY_TRACT | Status: AC
  Administered 2015-11-20: 3 mL via RESPIRATORY_TRACT
  Filled 2015-11-20: qty 3

## 2015-11-20 MED ORDER — ALBUTEROL SULFATE (2.5 MG/3ML) 0.083% IN NEBU
INHALATION_SOLUTION | RESPIRATORY_TRACT | Status: AC
  Filled 2015-11-20: qty 6

## 2015-11-20 MED ORDER — METHYLPREDNISOLONE SODIUM SUCC 125 MG IJ SOLR
125.0000 mg | Freq: Once | INTRAMUSCULAR | Status: AC
  Administered 2015-11-20: 125 mg via INTRAVENOUS
  Filled 2015-11-20: qty 2

## 2015-11-20 NOTE — ED Provider Notes (Signed)
History  By signing my name below, I, Karle Plumber, attest that this documentation has been prepared under the direction and in the presence of Dione Booze, MD. Electronically Signed: Karle Plumber, ED Scribe. 11/21/2015. 1:11 AM  Chief Complaint  Patient presents with  . Asthma   The history is provided by the patient and medical records. No language interpreter was used.    HPI Comments:  Kathleen Rodriguez is a 50 y.o. obese female with PMHx of asthma who presents to the Emergency Department complaining of an asthma exacerbation that began about three days ago. She reports difficulty breathing, chest tightness and nonproductive cough. She reports associated chills. Pt states it feels as if cotton is in her throat. She states she has been admitted to the hospital for past asthma exacerbations. Pt reports using her nebulizer treatments with no significant relief of her symptoms. Walking around and exertion increase her symptoms. She denies alleviating factors. She denies fever, sweats. Pt does not have a PCP but states she sees a pulmonologist, Dr. Craige Cotta. Pt is no longer a smoker.  Past Medical History  Diagnosis Date  . Arthritis   . Asthma   . Hypertension   . Influenza B April 2014    Complicated by multi-organ failure  . 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 OSA (obstructive sleep apnea) 03/20/2013   Past Surgical History  Procedure Laterality Date  . Tubal ligation    . Cesarean section     Family History  Problem Relation Age of Onset  . Hypertension Mother   . HIV/AIDS Father    Social History  Substance Use Topics  . Smoking status: Former Smoker -- 0.25 packs/day for 20 years    Types: Cigarettes    Quit date: 01/31/2013  . Smokeless tobacco: Never Used  . Alcohol Use: No   OB History    No data available     Review of Systems  Respiratory: Positive for cough, shortness of breath and wheezing.   All other systems reviewed and are negative.   Allergies   Latex; Other; Tomato; and Wool alcohol  Home Medications   Prior to Admission medications   Medication Sig Start Date End Date Taking? Authorizing Provider  acetaminophen (TYLENOL) 500 MG tablet Take 1,000 mg by mouth every 6 (six) hours as needed for moderate pain.   Yes Historical Provider, MD  albuterol (PROVENTIL) (2.5 MG/3ML) 0.083% nebulizer solution Take 3 mLs (2.5 mg total) by nebulization every 2 (two) hours as needed for wheezing or shortness of breath. 07/27/15  Yes Rhetta Mura, MD  albuterol (VENTOLIN HFA) 108 (90 BASE) MCG/ACT inhaler Inhale 2 puffs into the lungs every 6 (six) hours as needed for wheezing. 07/27/15  Yes Rhetta Mura, MD  budesonide (PULMICORT) 0.25 MG/2ML nebulizer solution Take 2 mLs (0.25 mg total) by nebulization 2 (two) times daily. 08/12/15  Yes Catarina Hartshorn, MD  ferrous sulfate 325 (65 FE) MG tablet Take 1 tablet (325 mg total) by mouth 2 (two) times daily with a meal. 08/12/15  Yes Catarina Hartshorn, MD  fluticasone (FLONASE) 50 MCG/ACT nasal spray Place 2 sprays into the nose daily. 02/21/13  Yes Bernadene Person, NP  loratadine (CLARITIN) 10 MG tablet Take 1 tablet (10 mg total) by mouth daily. 08/12/15  Yes Catarina Hartshorn, MD  meloxicam (MOBIC) 7.5 MG tablet Take 1 tablet (7.5 mg total) by mouth daily. 08/19/15  Yes [name], MD  oxymetazoline (AFRIN) 0.05 % nasal spray Place 2 sprays into both nostrils 2 (  two) times daily. X 2 days, then bid prn wheezing and congestion 08/12/15  Yes Catarina Hartshorn, MD  metroNIDAZOLE (FLAGYL) 500 MG tablet Take 1 tablet (500 mg total) by mouth 2 (two) times daily. Patient not taking: Reported on 11/21/2015 11/04/15   Eyvonne Mechanic, PA-C  pseudoephedrine (SUDAFED) 120 MG 12 hr tablet Take 1 tablet (120 mg total) by mouth 2 (two) times daily. Patient not taking: Reported on 09/02/2015 08/12/15   Catarina Hartshorn, MD  sodium chloride (OCEAN) 0.65 % SOLN nasal spray Place 1 spray into both nostrils 4 (four) times daily. Patient not  taking: Reported on 11/21/2015 08/12/15   Catarina Hartshorn, MD  traMADol (ULTRAM) 50 MG tablet Take 1 tablet (50 mg total) by mouth every 6 (six) hours as needed. Patient not taking: Reported on 09/02/2015 08/12/15   Catarina Hartshorn, MD  Vitamin D, Ergocalciferol, (DRISDOL) 50000 UNITS CAPS capsule Take 1 capsule (50,000 Units total) by mouth every 7 (seven) days. On thursdays. Patient not taking: Reported on 09/02/2015 12/29/14   Clydia Llano, MD   Triage Vitals: BP 133/96 mmHg  Pulse 98  Temp(Src) 98.4 F (36.9 C)  Resp 24  Ht  (1.676 m)  Wt 287 lb (130.182 kg)  BMI 46.35 kg/m2  SpO2 100%  LMP 10/04/2015 (Approximate) Physical Exam  Constitutional: She is oriented to person, place, and time. She appears well-developed and well-nourished.  HENT:  Head: Normocephalic and atraumatic.  Eyes: EOM are normal. Pupils are equal, round, and reactive to light.  Neck: Normal range of motion. Neck supple. No JVD present.  Cardiovascular: Normal rate, regular rhythm and normal heart sounds.   No murmur heard. Pulmonary/Chest: Effort normal. She has wheezes. She has no rales. She exhibits no tenderness.  Diffuse expiratory wheezes No use of accessory muscles  Abdominal: Soft. Bowel sounds are normal. She exhibits no distension and no mass. There is no tenderness.  Musculoskeletal: Normal range of motion. She exhibits no edema.  Lymphadenopathy:    She has no cervical adenopathy.  Neurological: She is alert and oriented to person, place, and time. No cranial nerve deficit. She exhibits normal muscle tone. Coordination normal.  Skin: Skin is warm and dry. No rash noted.  Psychiatric: She has a normal mood and affect. Her behavior is normal. Judgment and thought content normal.  Nursing note and vitals reviewed.   ED Course  Procedures (including critical care time) DIAGNOSTIC STUDIES: Oxygen Saturation is 100% on RA, normal by my interpretation.   COORDINATION OF CARE: 11:22 PM- Will order IV  steroids, labs and another nebulizer treatment. Pt verbalizes understanding and agrees to plan.  Medications  magnesium sulfate IVPB 2 g 50 mL (2 g Intravenous New Bag/Given 11/21/15 0055)  albuterol (PROVENTIL) (2.5 MG/3ML) 0.083% nebulizer solution 5 mg (5 mg Nebulization Given 11/20/15 2152)  ipratropium-albuterol (DUONEB) 0.5-2.5 (3) MG/3ML nebulizer solution 3 mL (3 mLs Nebulization Given 11/20/15 2340)  methylPREDNISolone sodium succinate (SOLU-MEDROL) 125 mg/2 mL injection 125 mg (125 mg Intravenous Given 11/20/15 2336)  ipratropium-albuterol (DUONEB) 0.5-2.5 (3) MG/3ML nebulizer solution 3 mL (3 mLs Nebulization Given 11/21/15 0055)   Labs Review Results for orders placed or performed during the hospital encounter of 11/20/15  Basic metabolic panel  Result Value Ref Range   Sodium 139 135 - 145 mmol/L   Potassium 3.9 3.5 - 5.1 mmol/L   Chloride 107 101 - 111 mmol/L   CO2 22 22 - 32 mmol/L   Glucose, Bld 134 (H) 65 - 99 mg/dL   BUN  12 6 - 20 mg/dL   Creatinine, Ser 1.61 0.44 - 1.00 mg/dL   Calcium 8.6 (L) 8.9 - 10.3 mg/dL   GFR calc non Af Amer >60 >60 mL/min   GFR calc Af Amer >60 >60 mL/min   Anion gap 10 5 - 15  CBC with Differential  Result Value Ref Range   WBC 8.1 4.0 - 10.5 K/uL   RBC 4.58 3.87 - 5.11 MIL/uL   Hemoglobin 12.7 12.0 - 15.0 g/dL   HCT 09.6 04.5 - 40.9 %   MCV 86.0 78.0 - 100.0 fL   MCH 27.7 26.0 - 34.0 pg   MCHC 32.2 30.0 - 36.0 g/dL   RDW 81.1 91.4 - 78.2 %   Platelets 299 150 - 400 K/uL   Neutrophils Relative % 44 %   Neutro Abs 3.6 1.7 - 7.7 K/uL   Lymphocytes Relative 41 %   Lymphs Abs 3.3 0.7 - 4.0 K/uL   Monocytes Relative 8 %   Monocytes Absolute 0.7 0.1 - 1.0 K/uL   Eosinophils Relative 6 %   Eosinophils Absolute 0.5 0.0 - 0.7 K/uL   Basophils Relative 1 %   Basophils Absolute 0.0 0.0 - 0.1 K/uL    Imaging Review Dg Chest 2 View  11/20/2015  CLINICAL DATA:  Shortness of breath and wheezing with midsternal chest tightness and choking  sensation. EXAM: CHEST  2 VIEW COMPARISON:  08/09/2015 FINDINGS: The heart size and mediastinal contours are within normal limits. Both lungs are clear. The visualized skeletal structures are unremarkable. IMPRESSION: No active cardiopulmonary disease. Electronically Signed   By: Kennith Center M.D.   On: 11/20/2015 22:41   I have personally reviewed and evaluated these images and lab results as part of my medical decision-making.  MDM   Final diagnoses:  Asthma exacerbation    Asthma exacerbation. She is not showing any signs of respiratory distress but has not improved with albuterol treatment. She is given a dose of methylprednisolone and is given 2 additional nebulizer treatments with albuterol and ipratropium without any improvement. She continues to complain that her chest is tight but continues to be in no respiratory distress and is still not using muscles of respiration. On review of past records, she has several hospitalizations but I do not see any report of having been intubated. Case is discussed with Dr. Clyde Lundborg [facility] who agrees to admit the patient.  I personally performed the services described in this documentation, which was scribed in my presence. The recorded information has been reviewed and is accurate.      Dione Booze, MD 11/21/15 0200

## 2015-11-20 NOTE — ED Notes (Signed)
The pt is c/i difficulty breathing for 3 days   She has asthma  Wheezing on arrival to triage.  Her hhns at home  Have not been helping.  She denies having a cold

## 2015-11-21 DIAGNOSIS — I10 Essential (primary) hypertension: Secondary | ICD-10-CM

## 2015-11-21 DIAGNOSIS — J45901 Unspecified asthma with (acute) exacerbation: Principal | ICD-10-CM

## 2015-11-21 DIAGNOSIS — D509 Iron deficiency anemia, unspecified: Secondary | ICD-10-CM

## 2015-11-21 DIAGNOSIS — G4733 Obstructive sleep apnea (adult) (pediatric): Secondary | ICD-10-CM

## 2015-11-21 DIAGNOSIS — K219 Gastro-esophageal reflux disease without esophagitis: Secondary | ICD-10-CM

## 2015-11-21 LAB — GLUCOSE, CAPILLARY: Glucose-Capillary: 136 mg/dL — ABNORMAL HIGH (ref 65–99)

## 2015-11-21 LAB — BASIC METABOLIC PANEL
ANION GAP: 10 (ref 5–15)
BUN: 12 mg/dL (ref 6–20)
CHLORIDE: 107 mmol/L (ref 101–111)
CO2: 22 mmol/L (ref 22–32)
Calcium: 8.6 mg/dL — ABNORMAL LOW (ref 8.9–10.3)
Creatinine, Ser: 0.75 mg/dL (ref 0.44–1.00)
GFR calc Af Amer: >60 mL/min (ref >60)
GLUCOSE: 134 mg/dL — AB (ref 65–99)
POTASSIUM: 3.9 mmol/L (ref 3.5–5.1)
Sodium: 139 mmol/L (ref 135–145)

## 2015-11-21 LAB — RAPID URINE DRUG SCREEN, HOSP PERFORMED
Amphetamines: NOT DETECTED
BENZODIAZEPINES: NOT DETECTED
Barbiturates: NOT DETECTED
COCAINE: NOT DETECTED
OPIATES: NOT DETECTED
Tetrahydrocannabinol: NOT DETECTED

## 2015-11-21 LAB — PROTIME-INR
INR: 1.11 (ref 0.00–1.49)
PROTHROMBIN TIME: 14.5 s (ref 11.6–15.2)

## 2015-11-21 LAB — APTT: aPTT: 33 seconds (ref 24–37)

## 2015-11-21 LAB — INFLUENZA PANEL BY PCR (TYPE A & B)
H1N1FLUPCR: NOT DETECTED
Influenza A By PCR: NEGATIVE
Influenza B By PCR: NEGATIVE

## 2015-11-21 LAB — STREP PNEUMONIAE URINARY ANTIGEN: STREP PNEUMO URINARY ANTIGEN: NEGATIVE

## 2015-11-21 LAB — HIV ANTIBODY (ROUTINE TESTING W REFLEX): HIV SCREEN 4TH GENERATION: NONREACTIVE

## 2015-11-21 MED ORDER — IPRATROPIUM-ALBUTEROL 0.5-2.5 (3) MG/3ML IN SOLN
3.0000 mL | RESPIRATORY_TRACT | Status: DC
  Administered 2015-11-21 – 2015-11-22 (×7): 3 mL via RESPIRATORY_TRACT
  Filled 2015-11-21 (×7): qty 3

## 2015-11-21 MED ORDER — SODIUM CHLORIDE 0.9 % IV SOLN
INTRAVENOUS | Status: DC
  Administered 2015-11-21: 02:00:00 via INTRAVENOUS

## 2015-11-21 MED ORDER — PANTOPRAZOLE SODIUM 40 MG PO TBEC
40.0000 mg | DELAYED_RELEASE_TABLET | Freq: Every day | ORAL | Status: DC
  Administered 2015-11-21 – 2015-11-22 (×2): 40 mg via ORAL
  Filled 2015-11-21 (×2): qty 1

## 2015-11-21 MED ORDER — MAGNESIUM SULFATE 2 GM/50ML IV SOLN
2.0000 g | Freq: Once | INTRAVENOUS | Status: AC
  Administered 2015-11-21: 2 g via INTRAVENOUS
  Filled 2015-11-21: qty 50

## 2015-11-21 MED ORDER — IPRATROPIUM-ALBUTEROL 0.5-2.5 (3) MG/3ML IN SOLN
3.0000 mL | Freq: Once | RESPIRATORY_TRACT | Status: AC
  Administered 2015-11-21: 3 mL via RESPIRATORY_TRACT
  Filled 2015-11-21: qty 3

## 2015-11-21 MED ORDER — DM-GUAIFENESIN ER 30-600 MG PO TB12
1.0000 | ORAL_TABLET | Freq: Two times a day (BID) | ORAL | Status: DC
  Administered 2015-11-21 – 2015-11-22 (×4): 1 via ORAL
  Filled 2015-11-21 (×4): qty 1

## 2015-11-21 MED ORDER — METHYLPREDNISOLONE SODIUM SUCC 125 MG IJ SOLR
60.0000 mg | Freq: Three times a day (TID) | INTRAMUSCULAR | Status: DC
  Administered 2015-11-21: 60 mg via INTRAVENOUS
  Filled 2015-11-21: qty 2

## 2015-11-21 MED ORDER — ENOXAPARIN SODIUM 60 MG/0.6ML ~~LOC~~ SOLN
60.0000 mg | SUBCUTANEOUS | Status: DC
  Filled 2015-11-21: qty 0.6

## 2015-11-21 MED ORDER — ALBUTEROL SULFATE (2.5 MG/3ML) 0.083% IN NEBU: 2.5000 mg | INHALATION_SOLUTION | RESPIRATORY_TRACT | Status: AC | PRN

## 2015-11-21 MED ORDER — FERROUS SULFATE 325 (65 FE) MG PO TABS
325.0000 mg | ORAL_TABLET | Freq: Two times a day (BID) | ORAL | Status: DC
  Administered 2015-11-21 – 2015-11-22 (×3): 325 mg via ORAL
  Filled 2015-11-21 (×3): qty 1

## 2015-11-21 MED ORDER — LORATADINE 10 MG PO TABS
10.0000 mg | ORAL_TABLET | Freq: Every day | ORAL | Status: DC
  Administered 2015-11-21 – 2015-11-22 (×2): 10 mg via ORAL
  Filled 2015-11-21 (×2): qty 1

## 2015-11-21 MED ORDER — FLUCONAZOLE 100 MG PO TABS
100.0000 mg | ORAL_TABLET | Freq: Once | ORAL | Status: AC
  Administered 2015-11-21: 100 mg via ORAL
  Filled 2015-11-21: qty 1

## 2015-11-21 MED ORDER — MELOXICAM 7.5 MG PO TABS
7.5000 mg | ORAL_TABLET | Freq: Every day | ORAL | Status: DC
  Administered 2015-11-21 – 2015-11-22 (×2): 7.5 mg via ORAL
  Filled 2015-11-21 (×2): qty 1

## 2015-11-21 MED ORDER — ACETAMINOPHEN 325 MG PO TABS
650.0000 mg | ORAL_TABLET | Freq: Four times a day (QID) | ORAL | Status: DC | PRN
  Administered 2015-11-21: 650 mg via ORAL
  Filled 2015-11-21: qty 2

## 2015-11-21 MED ORDER — AZITHROMYCIN 500 MG PO TABS: 250.0000 mg | ORAL_TABLET | Freq: Every day | ORAL | Status: DC

## 2015-11-21 MED ORDER — METHYLPREDNISOLONE SODIUM SUCC 125 MG IJ SOLR
60.0000 mg | Freq: Two times a day (BID) | INTRAMUSCULAR | Status: DC
Start: 2015-11-21 — End: 2015-11-22
  Administered 2015-11-21 – 2015-11-22 (×2): 60 mg via INTRAVENOUS
  Filled 2015-11-21 (×2): qty 2

## 2015-11-21 MED ORDER — IPRATROPIUM-ALBUTEROL 0.5-2.5 (3) MG/3ML IN SOLN
3.0000 mL | RESPIRATORY_TRACT | Status: DC
  Administered 2015-11-21: 3 mL via RESPIRATORY_TRACT
  Filled 2015-11-21: qty 3

## 2015-11-21 MED ORDER — AZITHROMYCIN 500 MG PO TABS: 500.0000 mg | ORAL_TABLET | Freq: Every day | ORAL | Status: DC

## 2015-11-21 MED ORDER — CYCLOBENZAPRINE HCL 5 MG PO TABS
7.5000 mg | ORAL_TABLET | Freq: Three times a day (TID) | ORAL | Status: DC | PRN
  Administered 2015-11-21 (×2): 7.5 mg via ORAL
  Filled 2015-11-21 (×3): qty 2

## 2015-11-21 MED ORDER — FLUTICASONE PROPIONATE 50 MCG/ACT NA SUSP
2.0000 | Freq: Every day | NASAL | Status: DC
  Administered 2015-11-21 – 2015-11-22 (×2): 2 via NASAL
  Filled 2015-11-21 (×2): qty 16

## 2015-11-21 NOTE — Progress Notes (Signed)
Patient c/o 10/10 leg pain and cramping primarily in the left leg. MD notified. Will continue to monitor.

## 2015-11-21 NOTE — Progress Notes (Signed)
Kathleen Rodriguez is a 50 y.o. female patient admitted from ED awake, alert - oriented  X 4 - no acute distress noted.  VSS - Blood pressure 144/49, pulse 99, temperature 97.8 F (36.6 C), temperature source Oral, resp. rate 20, height  (1.676 m), weight 116.3 kg (256 lb 6.3 oz), last menstrual period 10/04/2015, SpO2 98 %.    IV in place, occlusive dsg intact without redness.  Orientation to room, and floor completed with information packet given to patient.  Patient declined safety video at this time.  Admission INP armband ID verified with patient, and in place.   SR up x 2, fall assessment complete, with patient able to verbalize understanding of risk associated with falls, and verbalized understanding to call nsg before up out of bed.  Call light within reach, patient able to voice, and demonstrate understanding.  Skin, clean-dry- intact without evidence of bruising, or skin tears.   No evidence of skin break down noted on exam.     Will cont to eval and treat per MD orders.  Otis Dials, RN 11/21/2015 3:48 AM

## 2015-11-21 NOTE — Progress Notes (Signed)
Patient Demographics:    Kathleen Rodriguez, is a 50 y.o. female, DOB - [birth year], ZOX:[id number]  Admit date - 11/20/2015   Admitting Physician Lorretta Harp, MD  Outpatient Primary MD for the patient is No PCP Per Patient  LOS - 0   Chief Complaint  Patient presents with  . Asthma        Subjective:    Kathleen Rodriguez today has, No headache, No chest pain, No abdominal pain - No Nausea, No new weakness tingling or numbness, No Cough - SOB.    Assessment  & Plan :     1. Asthma exacerbation. Much improved with supportive care which includes IV steroids, azithromycin, negative strep pneumonia antigen, negative influenza panel, pending HIV. Continue supportive care and monitor. Wheezing has improved now off oxygen.  2. Morbid obesity. Outpatient follow-up with PCP.    Code Status : Full  Family Communication  :  None  Disposition Plan  : Home tomorrow  Consults  :    Procedures  :    DVT Prophylaxis  :  Lovenox    Lab Results  Component Value Date   PLT 299 11/20/2015    Inpatient Medications  Scheduled Meds: . dextromethorphan-guaiFENesin  1 tablet Oral BID  . enoxaparin (LOVENOX) injection  60 mg Subcutaneous Q24H  . ferrous sulfate  325 mg Oral BID WC  . fluticasone  2 spray Each Nare Daily  . ipratropium-albuterol  3 mL Nebulization Q4H  . loratadine  10 mg Oral Daily  . meloxicam  7.5 mg Oral Daily  . methylPREDNISolone (SOLU-MEDROL) injection  60 mg Intravenous Q12H  . pantoprazole  40 mg Oral Q1200   Continuous Infusions:  PRN Meds:.acetaminophen, albuterol, cyclobenzaprine  Antibiotics  :     Anti-infectives    Start     Dose/Rate Route Frequency Ordered Stop   11/22/15 1000  azithromycin (ZITHROMAX) tablet 250 mg  Status:  Discontinued     250 mg Oral Daily 11/21/15  0214 11/21/15 0935   11/21/15 1000  azithromycin (ZITHROMAX) tablet 500 mg  Status:  Discontinued     500 mg Oral Daily 11/21/15 0214 11/21/15 0935        Objective:   Filed Vitals:   11/21/15 0303 11/21/15 0355 11/21/15 0551 11/21/15 0825  BP: 144/49  120/68   Pulse: 99  99   Temp: 97.8 F (36.6 C)  98.2 F (36.8 C)   TempSrc: Oral  Oral   Resp: 20  18   Height:      Weight:      SpO2: 98% 96% 94% 96%    Wt Readings from Last 3 Encounters:  11/21/15 116.3 kg (256 lb 6.3 oz)  09/02/15 122.018 kg (269 lb)  08/19/15 122.471 kg (270 lb)     Intake/Output Summary (Last 24 hours) at 11/21/15 0936 Last data filed at 11/21/15 0645  Gross per 24 hour  Intake  637.5 ml  Output      0 ml  Net  637.5 ml     Physical Exam  Awake Alert, Oriented X 3, No new F.N deficits, Normal affect [place].AT,PERRAL Supple Neck,No JVD, No cervical lymphadenopathy appriciated.  Symmetrical Chest wall movement, Good air movement bilaterally, mild wheezing RRR,No Gallops,Rubs or new Murmurs,  No Parasternal Heave +ve B.Sounds, Abd Soft, No tenderness, No organomegaly appriciated, No rebound - guarding or rigidity. No Cyanosis, Clubbing or edema, No new Rash or bruise       Data Review:   Micro Results No results found for this or any previous visit (from the past 240 hour(s)).  Radiology Reports Dg Chest 2 View  11/20/2015  CLINICAL DATA:  Shortness of breath and wheezing with midsternal chest tightness and choking sensation. EXAM: CHEST  2 VIEW COMPARISON:  08/09/2015 FINDINGS: The heart size and mediastinal contours are within normal limits. Both lungs are clear. The visualized skeletal structures are unremarkable. IMPRESSION: No active cardiopulmonary disease. Electronically Signed   By: Kennith Center M.D.   On: 11/20/2015 22:41     CBC  Recent Labs Lab 11/20/15 2340  WBC 8.1  HGB 12.7  HCT 39.4  PLT 299  MCV 86.0  MCH 27.7  MCHC 32.2  RDW 15.1  LYMPHSABS 3.3  MONOABS 0.7    EOSABS 0.5  BASOSABS 0.0    Chemistries   Recent Labs Lab 11/20/15 2340  NA 139  K 3.9  CL 107  CO2 22  GLUCOSE 134*  BUN 12  CREATININE 0.75  CALCIUM 8.6*   ------------------------------------------------------------------------------------------------------------------ No results for input(s): CHOL, HDL, LDLCALC, TRIG, CHOLHDL, LDLDIRECT in the last 72 hours.  Lab Results  Component Value Date   HGBA1C 5.8* 07/26/2015   ------------------------------------------------------------------------------------------------------------------ No results for input(s): TSH, T4TOTAL, T3FREE, THYROIDAB in the last 72 hours.  Invalid input(s): FREET3 ------------------------------------------------------------------------------------------------------------------ No results for input(s): VITAMINB12, FOLATE, FERRITIN, TIBC, IRON, RETICCTPCT in the last 72 hours.  Coagulation profile  Recent Labs Lab 11/21/15 0444  INR 1.11    No results for input(s): DDIMER in the last 72 hours.  Cardiac Enzymes No results for input(s): CKMB, TROPONINI, MYOGLOBIN in the last 168 hours.  Invalid input(s): CK ------------------------------------------------------------------------------------------------------------------    Component Value Date/Time   BNP 15.4 08/10/2015 0110    Time Spent in minutes  35   SINGH,PRASHANT K M.D on 11/21/2015 at 9:36 AM  Between 7am to 7pm - Pager - [phone]  After 7pm go to [email or web address] - password [facility]  [facility] -  Office  [phone]

## 2015-11-21 NOTE — H&P (Signed)
[facility] History and Physical  Kathleen Rodriguez WJX:[id number] DOB: 02/02/1966 DOA: 11/20/2015  Referring physician: ED physician PCP: No PCP Per Patient  Specialists:   Chief Complaint: Shortness of breath and dry cough  HPI: Kathleen Rodriguez is a 50 y.o. female with PMH of hypertension, asthma, arthritis, OSA, history of intubation due to pneumonia 2015, who presents with shortness breath and dry cough.  Patient reports that she has been having dry cough and shortness in the past 3 days, which has been progressively getting worse. She also has chest tightness, but no chest pain. No fever and chills. No tenderness over calf areas. She reports that she has intermittent mild left lower abdominal pain recently, but no abdominal pain currently. No diarrhea, symptoms of UTI or unilateral weakness.  In ED, patient was found to have WBC 8.1, temperature normal, mildly tachycardia, electrolytes and renal function okay, negative chest x-ray for infiltration. Patient admitted to inpatient for further eval and treatment.  EKG: Not done in ED, will get one.   Where does patient live?   At home  Can patient participate in ADLs?  Yes    Review of Systems:   General: no fevers, chills, no changes in body weight, has fatigue HEENT: no blurry vision, hearing changes or sore throat Pulm: has dyspnea, coughing, wheezing CV: no chest pain, palpitations Abd: no nausea, vomiting, abdominal pain, diarrhea, constipation GU: no dysuria, burning on urination, increased urinary frequency, hematuria  Ext: no leg edema Neuro: no unilateral weakness, numbness, or tingling, no vision change or hearing loss Skin: no rash MSK: No muscle spasm, no deformity, no limitation of range of movement in spin Heme: No easy bruising.  Travel history: No recent long distant travel.  Allergy:  Allergies  Allergen Reactions  . Latex Itching and Rash  . Other Hives and Itching    Ketchup  . Tomato Hives and Itching  .  Wool Alcohol [Lanolin] Itching    Past Medical History  Diagnosis Date  . Arthritis   . Asthma   . Hypertension   . Influenza B April 2014    Complicated by multi-organ failure  . 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 OSA (obstructive sleep apnea) 03/20/2013    Past Surgical History  Procedure Laterality Date  . Tubal ligation    . Cesarean section      Social History:  reports that she quit smoking about 2 years ago. Her smoking use included Cigarettes. She has a 5 pack-year smoking history. She has never used smokeless tobacco. She reports that she does not drink alcohol or use illicit drugs.  Family History:  Family History  Problem Relation Age of Onset  . Hypertension Mother   . HIV/AIDS Father      Prior to Admission medications   Medication Sig Start Date End Date Taking? Authorizing Provider  acetaminophen (TYLENOL) 500 MG tablet Take 1,000 mg by mouth every 6 (six) hours as needed for moderate pain.   Yes Historical Provider, MD  albuterol (PROVENTIL) (2.5 MG/3ML) 0.083% nebulizer solution Take 3 mLs (2.5 mg total) by nebulization every 2 (two) hours as needed for wheezing or shortness of breath. 07/27/15  Yes Rhetta Mura, MD  albuterol (VENTOLIN HFA) 108 (90 BASE) MCG/ACT inhaler Inhale 2 puffs into the lungs every 6 (six) hours as needed for wheezing. 07/27/15  Yes Rhetta Mura, MD  budesonide (PULMICORT) 0.25 MG/2ML nebulizer solution Take 2 mLs (0.25 mg total) by nebulization 2 (two) times daily. 08/12/15  Yes Onalee Hua Tat,  MD  ferrous sulfate 325 (65 FE) MG tablet Take 1 tablet (325 mg total) by mouth 2 (two) times daily with a meal. 08/12/15  Yes Catarina Hartshorn, MD  fluticasone (FLONASE) 50 MCG/ACT nasal spray Place 2 sprays into the nose daily. 02/21/13  Yes Bernadene Person, NP  loratadine (CLARITIN) 10 MG tablet Take 1 tablet (10 mg total) by mouth daily. 08/12/15  Yes Catarina Hartshorn, MD  meloxicam (MOBIC) 7.5 MG tablet Take 1 tablet (7.5 mg total) by mouth  daily. 08/19/15  Yes [name], MD  oxymetazoline (AFRIN) 0.05 % nasal spray Place 2 sprays into both nostrils 2 (two) times daily. X 2 days, then bid prn wheezing and congestion 08/12/15  Yes Catarina Hartshorn, MD  metroNIDAZOLE (FLAGYL) 500 MG tablet Take 1 tablet (500 mg total) by mouth 2 (two) times daily. Patient not taking: Reported on 11/21/2015 11/04/15   Eyvonne Mechanic, PA-C  pseudoephedrine (SUDAFED) 120 MG 12 hr tablet Take 1 tablet (120 mg total) by mouth 2 (two) times daily. Patient not taking: Reported on 09/02/2015 08/12/15   Catarina Hartshorn, MD  sodium chloride (OCEAN) 0.65 % SOLN nasal spray Place 1 spray into both nostrils 4 (four) times daily. Patient not taking: Reported on 11/21/2015 08/12/15   Catarina Hartshorn, MD  traMADol (ULTRAM) 50 MG tablet Take 1 tablet (50 mg total) by mouth every 6 (six) hours as needed. Patient not taking: Reported on 09/02/2015 08/12/15   Catarina Hartshorn, MD  Vitamin D, Ergocalciferol, (DRISDOL) 50000 UNITS CAPS capsule Take 1 capsule (50,000 Units total) by mouth every 7 (seven) days. On thursdays. Patient not taking: Reported on 09/02/2015 12/29/14   Clydia Llano, MD    Physical Exam: Filed Vitals:   11/21/15 0045 11/21/15 0100 11/21/15 0115 11/21/15 0130  BP: 128/83 115/70 129/68 116/81  Pulse: 103 86 93 96  Temp:      Resp:      Height:      Weight:      SpO2: 95% 99% 94% 96%   General: Not in acute distress HEENT:       Eyes: PERRL, EOMI, no scleral icterus.       ENT: No discharge from the ears and nose, no pharynx injection, no tonsillar enlargement.        Neck: No JVD, no bruit, no mass felt. Heme: No neck lymph node enlargement. Cardiac: S1/S2, RRR, No murmurs, No gallops or rubs. Pulm: Decreased air movement bilaterally, diffused wheezing bilaterally, No rales or rubs. Abd: Soft, nondistended, nontender, no rebound pain, no organomegaly, BS present. Ext: No pitting leg edema bilaterally. 2+DP/PT pulse bilaterally. Musculoskeletal: No joint deformities,  No joint redness or warmth, no limitation of ROM in spin. Skin: No rashes.  Neuro: Alert, oriented X3, cranial nerves II-XII grossly intact, moves all extremities normally. Psych: Patient is not psychotic, no suicidal or hemocidal ideation.  Labs on Admission:  Basic Metabolic Panel:  Recent Labs Lab 11/20/15 2340  NA 139  K 3.9  CL 107  CO2 22  GLUCOSE 134*  BUN 12  CREATININE 0.75  CALCIUM 8.6*   Liver Function Tests: No results for input(s): AST, ALT, ALKPHOS, BILITOT, PROT, ALBUMIN in the last 168 hours. No results for input(s): LIPASE, AMYLASE in the last 168 hours. No results for input(s): AMMONIA in the last 168 hours. CBC:  Recent Labs Lab 11/20/15 2340  WBC 8.1  NEUTROABS 3.6  HGB 12.7  HCT 39.4  MCV 86.0  PLT 299   Cardiac Enzymes: No results  for input(s): CKTOTAL, CKMB, CKMBINDEX, TROPONINI in the last 168 hours.  BNP (last 3 results)  Recent Labs  08/10/15 0110  BNP 15.4    ProBNP (last 3 results) No results for input(s): PROBNP in the last 8760 hours.  CBG: No results for input(s): GLUCAP in the last 168 hours.  Radiological Exams on Admission: Dg Chest 2 View  11/20/2015  CLINICAL DATA:  Shortness of breath and wheezing with midsternal chest tightness and choking sensation. EXAM: CHEST  2 VIEW COMPARISON:  08/09/2015 FINDINGS: The heart size and mediastinal contours are within normal limits. Both lungs are clear. The visualized skeletal structures are unremarkable. IMPRESSION: No active cardiopulmonary disease. Electronically Signed   By: Kennith Center M.D.   On: 11/20/2015 22:41    Assessment/Plan Principal Problem:   Asthma exacerbation Active Problems:   OSA (obstructive sleep apnea)   Hypertension   GERD (gastroesophageal reflux disease)   Morbid obesity (HCC)   Anemia, iron deficiency  Asthma exacerbation: No pneumonia by chest x-ray. No chest pain or signs of DVT, less likely to have pulmonary embolism. Her shortness of breath,  wheezing and dry cough are consistent with asthma exacerbation.  -will admit to tele bed (due to tachycardia) -start Z pak x 5 days - Urine S. pneumococcal antigen -Nebulizers: scheduled Duoneb and prn albuterol -Solu-Medrol 60 mg IV q8h  -Mucinex for cough  -Blood and sputum culture, Flu pcr -Urine drug screen, HIV  OSA: -CPAP  Hypertension: Not taking medications. Blood pressure is normal -Observe closely  Anemia, iron deficiency: Hemoglobin 12.7 on admission -continue iron supplement  GERD: -Protonix  DVT ppx: SQ Lovenox  Code Status: Full code Family Communication: None at bed side.    Disposition Plan: Admit to inpatient   Date of Service 11/21/2015    Lorretta Harp [facility] Pager [phone]  If 7PM-7AM, please contact night-coverage [email or web address] Password [email or web address] 11/21/2015, 2:16 AM

## 2015-11-21 NOTE — Progress Notes (Signed)
Placed patient on CPAP at 7cm of pressure per home regimen per patient. Water chamber filled with sterile water and patient is resting comfortably at this time.

## 2015-11-21 NOTE — Progress Notes (Signed)
Patient continues to complain of bilateral leg pain and cramping. MD was notified and new orders were placed. Will continue to monitor.

## 2015-11-21 NOTE — Progress Notes (Signed)
PT Cancellation Note  Patient Details Name: Nyna Chilton MRN: [id number] DOB: [birth year]   Cancelled Treatment:    Reason Eval/Treat Not Completed: PT screened, no needs identified, will sign off. Pt independent with mobility.   Dorothyann Mourer 11/21/2015, 10:07 AM Skip Mayer PT [phone]

## 2015-11-22 MED ORDER — PREDNISONE 5 MG PO TABS: ORAL_TABLET | ORAL | Status: DC

## 2015-11-22 MED ORDER — FLUCONAZOLE 100 MG PO TABS: 100.0000 mg | ORAL_TABLET | Freq: Once | ORAL | Status: DC

## 2015-11-22 NOTE — Progress Notes (Signed)
Kathleen Rodriguez to be D/C'd Home per MD order.  Discussed with the patient and all questions fully answered.  VSS, Skin clean, dry and intact without evidence of skin break down, no evidence of skin tears noted. IV catheter discontinued intact. Site without signs and symptoms of complications. Dressing and pressure applied.  An After Visit Summary was printed and given to the patient. Patient received prescription.  D/c education completed with patient/family including follow up instructions, medication list, d/c activities limitations if indicated, with other d/c instructions as indicated by MD - patient able to verbalize understanding, all questions fully answered.   Patient instructed to return to ED, call 911, or call MD for any changes in condition.   Patient escorted via WC, and D/C home via private auto.  Pura Spice 11/22/2015 10:22 AM

## 2015-11-22 NOTE — Discharge Summary (Signed)
Kathleen Rodriguez, is a 50 y.o. female  DOB 1965/12/17  MRN [id number].  Admission date:  11/20/2015  Admitting Physician  Lorretta Harp, MD  Discharge Date:  11/22/2015   Primary MD  No PCP Per Patient  Recommendations for primary care physician for things to follow:   Follow-up with her pulmonologist Dr. Craige Cotta   Admission Diagnosis  Asthma exacerbation [J45.901]   Discharge Diagnosis  Asthma exacerbation [J45.901]     Principal Problem:   Asthma exacerbation Active Problems:   OSA (obstructive sleep apnea)   Hypertension   GERD (gastroesophageal reflux disease)   Morbid obesity (HCC)   Anemia, iron deficiency      Past Medical History  Diagnosis Date  . Arthritis   . Asthma   . Hypertension   . Influenza B April 2014    Complicated by multi-organ failure  . 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 OSA (obstructive sleep apnea) 03/20/2013    Past Surgical History  Procedure Laterality Date  . Tubal ligation    . Cesarean section         HPI  from the history and physical done on the day of admission:   Kathleen Rodriguez is a 50 y.o. female with PMH of hypertension, asthma, arthritis, OSA, history of intubation due to pneumonia 2015, who presents with shortness breath and dry cough.  Patient reports that she has been having dry cough and shortness in the past 3 days, which has been progressively getting worse. She also has chest tightness, but no chest pain. No fever and chills. No tenderness over calf areas. She reports that she has intermittent mild left lower abdominal pain recently, but no abdominal pain currently. No diarrhea, symptoms of UTI or unilateral weakness.  In ED, patient was found to have WBC 8.1, temperature normal, mildly tachycardia, electrolytes and renal function okay, negative chest  x-ray for infiltration. Patient admitted to inpatient for further eval and treatment.    Hospital Course:     1. Acute on chronic hypoxic respiratory failure due to asthma exacerbation. Much improved after supportive care with IV steroids, nebulizer treatments and oxygen, now back to baseline, no wheezing on exam, no oxygen demand, ambulated in the hallway with pulse ox staying above 92%. She is symptom free. Will be placed on oral steroid taper, she already has her nebulizer refills at home, she will continue her Claritin unchanged, she will follow with her primary pulmonologist Dr. Craige Cotta in one week. She was negative for influenza & HIV.  2. Morbid obesity with obstructive sleep apnea. Outpatient follow-up with PCP, on CPAP which she will continue at nighttime.  3. Anemia find deficiency which is chronic. Continue iron supplementation. Outpatient age-appropriate workup by PCP.  4. GERD. On PPI.  5. Nonspecific EKG changes, present on previous EKG as well. No chest pain, troponin negative, completely symptom free, ambulated without any discomfort. We'll have her follow one time with outpatient cardiology post discharge.       Discharge Condition: Stable  Follow UP  Follow-up  Information    Follow up with SOOD,VINEET, MD. Schedule an appointment as soon as possible for a visit in 1 week.   Specialty:  Pulmonary Disease   Contact information:   [address] [place] [address] [phone]       Follow up with Nahser, Deloris Ping, MD. Schedule an appointment as soon as possible for a visit in 1 week.   Specialty:  Cardiology   Contact information:   [address] [place] [address] [phone]        Consults obtained - None  Diet and Activity recommendation: See Discharge Instructions below  Discharge Instructions           Discharge Instructions    Diet - low sodium heart healthy    Complete by:  As directed      Discharge instructions     Complete by:  As directed   Follow with Primary MD No PCP Per Patient in 7 days   Get CBC, CMP, 2 view Chest X ray checked  by Primary MD next visit.    Activity: As tolerated with Full fall precautions use walker/cane & assistance as needed   Disposition Home     Diet:   Heart Healthy    For Heart failure patients - Check your Weight same time everyday, if you gain over 2 pounds, or you develop in leg swelling, experience more shortness of breath or chest pain, call your Primary MD immediately. Follow Cardiac Low Salt Diet and 1.5 lit/day fluid restriction.   On your next visit with your primary care physician please Get Medicines reviewed and adjusted.   Please request your Prim.MD to go over all Hospital Tests and Procedure/Radiological results at the follow up, please get all Hospital records sent to your Prim MD by signing hospital release before you go home.   If you experience worsening of your admission symptoms, develop shortness of breath, life threatening emergency, suicidal or homicidal thoughts you must seek medical attention immediately by calling 911 or calling your MD immediately  if symptoms less severe.  You Must read complete instructions/literature along with all the possible adverse reactions/side effects for all the Medicines you take and that have been prescribed to you. Take any new Medicines after you have completely understood and accpet all the possible adverse reactions/side effects.   Do not drive, operating heavy machinery, perform activities at heights, swimming or participation in water activities or provide baby sitting services if your were admitted for syncope or siezures until you have seen by Primary MD or a Neurologist and advised to do so again.  Do not drive when taking Pain medications.    Do not take more than prescribed Pain, Sleep and Anxiety Medications  Special Instructions: If you have smoked or chewed Tobacco  in the last 2 yrs please  stop smoking, stop any regular Alcohol  and or any Recreational drug use.  Wear Seat belts while driving.   Please note  You were cared for by a hospitalist during your hospital stay. If you have any questions about your discharge medications or the care you received while you were in the hospital after you are discharged, you can call the unit and asked to speak with the hospitalist on call if the hospitalist that took care of you is not available. Once you are discharged, your primary care physician will handle any further medical issues. Please note that NO REFILLS for any discharge medications will be authorized  once you are discharged, as it is imperative that you return to your primary care physician (or establish a relationship with a primary care physician if you do not have one) for your aftercare needs so that they can reassess your need for medications and monitor your lab values.     Increase activity slowly    Complete by:  As directed              Discharge Medications       Medication List    STOP taking these medications        meloxicam 7.5 MG tablet  Commonly known as:  MOBIC     metroNIDAZOLE 500 MG tablet  Commonly known as:  FLAGYL      TAKE these medications        acetaminophen 500 MG tablet  Commonly known as:  TYLENOL  Take 1,000 mg by mouth every 6 (six) hours as needed for moderate pain.     albuterol (2.5 MG/3ML) 0.083% nebulizer solution  Commonly known as:  PROVENTIL  Take 3 mLs (2.5 mg total) by nebulization every 2 (two) hours as needed for wheezing or shortness of breath.     albuterol 108 (90 Base) MCG/ACT inhaler  Commonly known as:  VENTOLIN HFA  Inhale 2 puffs into the lungs every 6 (six) hours as needed for wheezing.     budesonide 0.25 MG/2ML nebulizer solution  Commonly known as:  PULMICORT  Take 2 mLs (0.25 mg total) by nebulization 2 (two) times daily.     ferrous sulfate 325 (65 FE) MG tablet  Take 1 tablet (325 mg total) by  mouth 2 (two) times daily with a meal.     fluconazole 100 MG tablet  Commonly known as:  DIFLUCAN  Take 1 tablet (100 mg total) by mouth once.     fluticasone 50 MCG/ACT nasal spray  Commonly known as:  FLONASE  Place 2 sprays into the nose daily.     loratadine 10 MG tablet  Commonly known as:  CLARITIN  Take 1 tablet (10 mg total) by mouth daily.     oxymetazoline 0.05 % nasal spray  Commonly known as:  AFRIN  Place 2 sprays into both nostrils 2 (two) times daily. X 2 days, then bid prn wheezing and congestion     predniSONE 5 MG tablet  Commonly known as:  DELTASONE  Label  & dispense according to the schedule below. 10 Pills PO for 3 days then, 8 Pills PO for 3 days, 6 Pills PO for 3 days, 4 Pills PO for 3 days, 2 Pills PO for 3 days, 1 Pills PO for 3 days, 1/2 Pill  PO for 3 days then STOP. Total 95 pills.     pseudoephedrine 120 MG 12 hr tablet  Commonly known as:  SUDAFED  Take 1 tablet (120 mg total) by mouth 2 (two) times daily.     sodium chloride 0.65 % Soln nasal spray  Commonly known as:  OCEAN  Place 1 spray into both nostrils 4 (four) times daily.     traMADol 50 MG tablet  Commonly known as:  ULTRAM  Take 1 tablet (50 mg total) by mouth every 6 (six) hours as needed.     Vitamin D (Ergocalciferol) 50000 units Caps capsule  Commonly known as:  DRISDOL  Take 1 capsule (50,000 Units total) by mouth every 7 (seven) days. On thursdays.        Major procedures and Radiology Reports -  PLEASE review detailed and final reports for all details, in brief -    Dg Chest 2 View  11/20/2015  CLINICAL DATA:  Shortness of breath and wheezing with midsternal chest tightness and choking sensation. EXAM: CHEST  2 VIEW COMPARISON:  08/09/2015 FINDINGS: The heart size and mediastinal contours are within normal limits. Both lungs are clear. The visualized skeletal structures are unremarkable. IMPRESSION: No active cardiopulmonary disease. Electronically Signed   By: Kennith Center M.D.   On: 11/20/2015 22:41    Micro Results      No results found for this or any previous visit (from the past 240 hour(s)).   Today   Subjective    Kathleen Rodriguez today has no headache,no chest abdominal pain,no new weakness tingling or numbness, feels much better wants to go home today.     Objective   Blood pressure 124/76, pulse 87, temperature 98.3 F (36.8 C), temperature source Oral, resp. rate 14, height  (1.676 m), weight 116.3 kg (256 lb 6.3 oz), last menstrual period 10/04/2015, SpO2 100 %.   Intake/Output Summary (Last 24 hours) at 11/22/15 0855 Last data filed at 11/21/15 1801  Gross per 24 hour  Intake    720 ml  Output      2 ml  Net    718 ml    Exam Awake Alert, Oriented x 3, No new F.N deficits, Normal affect [place].AT,PERRAL Supple Neck,No JVD, No cervical lymphadenopathy appriciated.  Symmetrical Chest wall movement, Good air movement bilaterally, CTAB RRR,No Gallops,Rubs or new Murmurs, No Parasternal Heave +ve B.Sounds, Abd Soft, Non tender, No organomegaly appriciated, No rebound -guarding or rigidity. No Cyanosis, Clubbing or edema, No new Rash or bruise   Data Review   CBC w Diff:  Lab Results  Component Value Date   WBC 8.1 11/20/2015   HGB 12.7 11/20/2015   HCT 39.4 11/20/2015   PLT 299 11/20/2015   LYMPHOPCT 41 11/20/2015   MONOPCT 8 11/20/2015   EOSPCT 6 11/20/2015   BASOPCT 1 11/20/2015    CMP:  Lab Results  Component Value Date   NA 139 11/20/2015   K 3.9 11/20/2015   CL 107 11/20/2015   CO2 22 11/20/2015   BUN 12 11/20/2015   CREATININE 0.75 11/20/2015   PROT 6.8 08/10/2015   ALBUMIN 3.2* 08/10/2015   BILITOT 0.1* 08/10/2015   ALKPHOS 56 08/10/2015   AST 34 08/10/2015   ALT 27 08/10/2015  .   Total Time in preparing paper work, data evaluation and todays exam - 35 minutes  Leroy Sea M.D on 11/22/2015 at 8:55 AM  [facility]   Office  [phone]

## 2015-11-22 NOTE — Discharge Instructions (Signed)
Follow with Primary MD No PCP Per Patient in 7 days  ° °Get CBC, CMP, 2 view Chest X ray checked  by Primary MD next visit.  ° ° °Activity: As tolerated with Full fall precautions use walker/cane & assistance as needed ° ° °Disposition Home   ° ° °Diet: Heart Healthy  ° °For Heart failure patients - Check your Weight same time everyday, if you gain over 2 pounds, or you develop in leg swelling, experience more shortness of breath or chest pain, call your Primary MD immediately. Follow Cardiac Low Salt Diet and 1.5 lit/day fluid restriction. ° ° °On your next visit with your primary care physician please Get Medicines reviewed and adjusted. ° ° °Please request your Prim.MD to go over all Hospital Tests and Procedure/Radiological results at the follow up, please get all Hospital records sent to your Prim MD by signing hospital release before you go home. ° ° °If you experience worsening of your admission symptoms, develop shortness of breath, life threatening emergency, suicidal or homicidal thoughts you must seek medical attention immediately by calling 911 or calling your MD immediately  if symptoms less severe. ° °You Must read complete instructions/literature along with all the possible adverse reactions/side effects for all the Medicines you take and that have been prescribed to you. Take any new Medicines after you have completely understood and accpet all the possible adverse reactions/side effects.  ° °Do not drive, operating heavy machinery, perform activities at heights, swimming or participation in water activities or provide baby sitting services if your were admitted for syncope or siezures until you have seen by Primary MD or a Neurologist and advised to do so again. ° °Do not drive when taking Pain medications.  ° ° °Do not take more than prescribed Pain, Sleep and Anxiety Medications ° °Special Instructions: If you have smoked or chewed Tobacco  in the last 2 yrs please stop smoking, stop any regular  Alcohol  and or any Recreational drug use. ° °Wear Seat belts while driving. ° ° °Please note ° °You were cared for by a hospitalist during your hospital stay. If you have any questions about your discharge medications or the care you received while you were in the hospital after you are discharged, you can call the unit and asked to speak with the hospitalist on call if the hospitalist that took care of you is not available. Once you are discharged, your primary care physician will handle any further medical issues. Please note that NO REFILLS for any discharge medications will be authorized once you are discharged, as it is imperative that you return to your primary care physician (or establish a relationship with a primary care physician if you do not have one) for your aftercare needs so that they can reassess your need for medications and monitor your lab values. ° °

## 2015-11-26 LAB — CULTURE, BLOOD (ROUTINE X 2)
CULTURE: NO GROWTH
Culture: NO GROWTH

## 2015-12-17 ENCOUNTER — Ambulatory Visit: Payer: Self-pay | Admitting: Pulmonary Disease

## 2016-01-27 ENCOUNTER — Observation Stay (HOSPITAL_COMMUNITY)
Admission: EM | Admit: 2016-01-27 | Discharge: 2016-01-28 | Disposition: A | Discharge status: Home / Self Care | Payer: Self-pay | Attending: Internal Medicine | Admitting: Internal Medicine

## 2016-01-27 ENCOUNTER — Encounter (HOSPITAL_COMMUNITY): Payer: Self-pay | Admitting: Emergency Medicine

## 2016-01-27 ENCOUNTER — Emergency Department (HOSPITAL_COMMUNITY): Payer: Self-pay

## 2016-01-27 DIAGNOSIS — J4542 Moderate persistent asthma with status asthmaticus: Principal | ICD-10-CM

## 2016-01-27 DIAGNOSIS — G4733 Obstructive sleep apnea (adult) (pediatric): Secondary | ICD-10-CM | POA: Insufficient documentation

## 2016-01-27 DIAGNOSIS — R0600 Dyspnea, unspecified: Secondary | ICD-10-CM

## 2016-01-27 DIAGNOSIS — R0603 Acute respiratory distress: Secondary | ICD-10-CM | POA: Diagnosis present

## 2016-01-27 DIAGNOSIS — I10 Essential (primary) hypertension: Secondary | ICD-10-CM | POA: Insufficient documentation

## 2016-01-27 DIAGNOSIS — Z6841 Body Mass Index (BMI) 40.0 and over, adult: Secondary | ICD-10-CM | POA: Insufficient documentation

## 2016-01-27 DIAGNOSIS — R000 Tachycardia, unspecified: Secondary | ICD-10-CM | POA: Diagnosis present

## 2016-01-27 DIAGNOSIS — Z87891 Personal history of nicotine dependence: Secondary | ICD-10-CM | POA: Insufficient documentation

## 2016-01-27 DIAGNOSIS — K219 Gastro-esophageal reflux disease without esophagitis: Secondary | ICD-10-CM | POA: Insufficient documentation

## 2016-01-27 DIAGNOSIS — J45998 Other asthma: Secondary | ICD-10-CM | POA: Diagnosis present

## 2016-01-27 DIAGNOSIS — J45902 Unspecified asthma with status asthmaticus: Secondary | ICD-10-CM | POA: Diagnosis present

## 2016-01-27 DIAGNOSIS — J45901 Unspecified asthma with (acute) exacerbation: Secondary | ICD-10-CM | POA: Insufficient documentation

## 2016-01-27 HISTORY — DX: Obstructive sleep apnea (adult) (pediatric): G47.33

## 2016-01-27 HISTORY — DX: Heart failure, unspecified: I50.9

## 2016-01-27 HISTORY — DX: Dependence on other enabling machines and devices: Z99.89

## 2016-01-27 HISTORY — DX: Other specified postprocedural states: Z98.890

## 2016-01-27 HISTORY — DX: Pneumonia, unspecified organism: J18.9

## 2016-01-27 LAB — BASIC METABOLIC PANEL
Anion gap: 11 (ref 5–15)
BUN: 10 mg/dL (ref 6–20)
CALCIUM: 8.7 mg/dL — AB (ref 8.9–10.3)
CO2: 26 mmol/L (ref 22–32)
CREATININE: 0.88 mg/dL (ref 0.44–1.00)
Chloride: 104 mmol/L (ref 101–111)
GFR calc non Af Amer: >60 mL/min (ref >60)
Glucose, Bld: 115 mg/dL — ABNORMAL HIGH (ref 65–99)
Potassium: 4.1 mmol/L (ref 3.5–5.1)
SODIUM: 141 mmol/L (ref 135–145)

## 2016-01-27 LAB — CBC WITH DIFFERENTIAL/PLATELET
BASOS PCT: 0 %
Basophils Absolute: 0 10*3/uL (ref 0.0–0.1)
EOS ABS: 0.4 10*3/uL (ref 0.0–0.7)
Eosinophils Relative: 5 %
HCT: 38.5 % (ref 36.0–46.0)
HEMOGLOBIN: 12.3 g/dL (ref 12.0–15.0)
Lymphocytes Relative: 49 %
Lymphs Abs: 3.8 10*3/uL (ref 0.7–4.0)
MCH: 27.6 pg (ref 26.0–34.0)
MCHC: 31.9 g/dL (ref 30.0–36.0)
MCV: 86.3 fL (ref 78.0–100.0)
MONO ABS: 0.7 10*3/uL (ref 0.1–1.0)
MONOS PCT: 10 %
NEUTROS PCT: 36 %
Neutro Abs: 2.8 10*3/uL (ref 1.7–7.7)
Platelets: 290 10*3/uL (ref 150–400)
RBC: 4.46 MIL/uL (ref 3.87–5.11)
RDW: 16.4 % — AB (ref 11.5–15.5)
WBC: 7.7 10*3/uL (ref 4.0–10.5)

## 2016-01-27 LAB — URINALYSIS, ROUTINE W REFLEX MICROSCOPIC
BILIRUBIN URINE: NEGATIVE
GLUCOSE, UA: NEGATIVE mg/dL
HGB URINE DIPSTICK: NEGATIVE
Ketones, ur: NEGATIVE mg/dL
Leukocytes, UA: NEGATIVE
Nitrite: NEGATIVE
PH: 5 (ref 5.0–8.0)
Protein, ur: NEGATIVE mg/dL
SPECIFIC GRAVITY, URINE: 1.025 (ref 1.005–1.030)

## 2016-01-27 LAB — INFLUENZA PANEL BY PCR (TYPE A & B)
H1N1 flu by pcr: NOT DETECTED
INFLAPCR: NEGATIVE
Influenza B By PCR: NEGATIVE

## 2016-01-27 LAB — I-STAT ARTERIAL BLOOD GAS, ED
ACID-BASE DEFICIT: 1 mmol/L (ref 0.0–2.0)
Bicarbonate: 25.2 mEq/L — ABNORMAL HIGH (ref 20.0–24.0)
O2 Saturation: 99 %
PH ART: 7.328 — AB (ref 7.350–7.450)
TCO2: 27 mmol/L (ref 0–100)
pCO2 arterial: 48 mmHg — ABNORMAL HIGH (ref 35.0–45.0)
pO2, Arterial: 131 mmHg — ABNORMAL HIGH (ref 80.0–100.0)

## 2016-01-27 LAB — TSH: TSH: 0.601 u[IU]/mL (ref 0.350–4.500)

## 2016-01-27 MED ORDER — ONDANSETRON HCL 4 MG/2ML IJ SOLN: 4.0000 mg | Freq: Four times a day (QID) | INTRAMUSCULAR | Status: DC | PRN

## 2016-01-27 MED ORDER — LORATADINE 10 MG PO TABS
10.0000 mg | ORAL_TABLET | Freq: Every day | ORAL | Status: DC
  Administered 2016-01-27 – 2016-01-28 (×2): 10 mg via ORAL
  Filled 2016-01-27 (×2): qty 1

## 2016-01-27 MED ORDER — ALBUTEROL (5 MG/ML) CONTINUOUS INHALATION SOLN
INHALATION_SOLUTION | RESPIRATORY_TRACT | Status: AC
  Administered 2016-01-27: 05:00:00
  Filled 2016-01-27: qty 20

## 2016-01-27 MED ORDER — FAMOTIDINE 20 MG PO TABS
20.0000 mg | ORAL_TABLET | Freq: Every day | ORAL | Status: DC
  Administered 2016-01-27: 20 mg via ORAL
  Filled 2016-01-27: qty 1

## 2016-01-27 MED ORDER — IPRATROPIUM BROMIDE 0.02 % IN SOLN
RESPIRATORY_TRACT | Status: AC
  Administered 2016-01-27: 0.5 mg
  Filled 2016-01-27: qty 2.5

## 2016-01-27 MED ORDER — SODIUM CHLORIDE 0.9 % IV SOLN: 250.0000 mL | INTRAVENOUS | Status: DC | PRN

## 2016-01-27 MED ORDER — TIOTROPIUM BROMIDE MONOHYDRATE 18 MCG IN CAPS
18.0000 ug | ORAL_CAPSULE | Freq: Every day | RESPIRATORY_TRACT | Status: DC
  Administered 2016-01-28: 18 ug via RESPIRATORY_TRACT
  Filled 2016-01-27: qty 5

## 2016-01-27 MED ORDER — FLUTICASONE PROPIONATE 50 MCG/ACT NA SUSP
1.0000 | Freq: Every day | NASAL | Status: DC
  Administered 2016-01-28: 1 via NASAL
  Filled 2016-01-27: qty 16

## 2016-01-27 MED ORDER — AZITHROMYCIN 250 MG PO TABS
500.0000 mg | ORAL_TABLET | Freq: Every day | ORAL | Status: AC
  Administered 2016-01-27: 500 mg via ORAL
  Filled 2016-01-27: qty 2

## 2016-01-27 MED ORDER — IPRATROPIUM-ALBUTEROL 0.5-2.5 (3) MG/3ML IN SOLN
3.0000 mL | RESPIRATORY_TRACT | Status: DC
  Administered 2016-01-27 – 2016-01-28 (×6): 3 mL via RESPIRATORY_TRACT
  Filled 2016-01-27 (×7): qty 3

## 2016-01-27 MED ORDER — POLYETHYLENE GLYCOL 3350 17 G PO PACK: 17.0000 g | PACK | Freq: Every day | ORAL | Status: DC | PRN

## 2016-01-27 MED ORDER — ALBUTEROL (5 MG/ML) CONTINUOUS INHALATION SOLN
20.0000 mg/h | INHALATION_SOLUTION | Freq: Once | RESPIRATORY_TRACT | Status: AC
  Administered 2016-01-27: 20 mg/h via RESPIRATORY_TRACT

## 2016-01-27 MED ORDER — SODIUM CHLORIDE 0.9% FLUSH
3.0000 mL | Freq: Two times a day (BID) | INTRAVENOUS | Status: DC
  Administered 2016-01-27: 3 mL via INTRAVENOUS

## 2016-01-27 MED ORDER — BISACODYL 5 MG PO TBEC: 5.0000 mg | DELAYED_RELEASE_TABLET | Freq: Every day | ORAL | Status: DC | PRN

## 2016-01-27 MED ORDER — METHYLPREDNISOLONE SODIUM SUCC 125 MG IJ SOLR
60.0000 mg | Freq: Four times a day (QID) | INTRAMUSCULAR | Status: DC
  Administered 2016-01-27 – 2016-01-28 (×4): 60 mg via INTRAVENOUS
  Filled 2016-01-27 (×5): qty 2

## 2016-01-27 MED ORDER — ACETAMINOPHEN 325 MG PO TABS
650.0000 mg | ORAL_TABLET | Freq: Four times a day (QID) | ORAL | Status: DC | PRN
Start: 2016-01-27 — End: 2016-01-28

## 2016-01-27 MED ORDER — SODIUM CHLORIDE 0.9% FLUSH: 3.0000 mL | INTRAVENOUS | Status: DC | PRN

## 2016-01-27 MED ORDER — ALBUTEROL (5 MG/ML) CONTINUOUS INHALATION SOLN
20.0000 mg/h | INHALATION_SOLUTION | RESPIRATORY_TRACT | Status: DC
  Administered 2016-01-27: 20 mg/h via RESPIRATORY_TRACT

## 2016-01-27 MED ORDER — SODIUM CHLORIDE 0.9% FLUSH
3.0000 mL | Freq: Two times a day (BID) | INTRAVENOUS | Status: DC
  Administered 2016-01-27 (×2): 3 mL via INTRAVENOUS

## 2016-01-27 MED ORDER — HYDROCODONE-ACETAMINOPHEN 5-325 MG PO TABS
1.0000 | ORAL_TABLET | Freq: Four times a day (QID) | ORAL | Status: DC | PRN
  Administered 2016-01-27 – 2016-01-28 (×2): 1 via ORAL
  Filled 2016-01-27 (×2): qty 1

## 2016-01-27 MED ORDER — ONDANSETRON HCL 4 MG PO TABS: 4.0000 mg | ORAL_TABLET | Freq: Four times a day (QID) | ORAL | Status: DC | PRN

## 2016-01-27 MED ORDER — ENOXAPARIN SODIUM 40 MG/0.4ML ~~LOC~~ SOLN: 40.0000 mg | SUBCUTANEOUS | Status: DC

## 2016-01-27 MED ORDER — ACETAMINOPHEN 650 MG RE SUPP: 650.0000 mg | Freq: Four times a day (QID) | RECTAL | Status: DC | PRN

## 2016-01-27 MED ORDER — ALBUTEROL SULFATE (2.5 MG/3ML) 0.083% IN NEBU: 2.5000 mg | INHALATION_SOLUTION | RESPIRATORY_TRACT | Status: DC | PRN

## 2016-01-27 MED ORDER — GUAIFENESIN ER 600 MG PO TB12
600.0000 mg | ORAL_TABLET | Freq: Two times a day (BID) | ORAL | Status: DC
  Administered 2016-01-27 – 2016-01-28 (×2): 600 mg via ORAL
  Filled 2016-01-27 (×2): qty 1

## 2016-01-27 MED ORDER — AZITHROMYCIN 250 MG PO TABS: 250.0000 mg | ORAL_TABLET | Freq: Every day | ORAL | Status: DC

## 2016-01-27 NOTE — ED Notes (Signed)
hhn given pt givedn a tooth brush and tooth paste

## 2016-01-27 NOTE — ED Notes (Signed)
Pt brought [facility] with asthma exacerbation. Pt is on Albuterol neb, O2 at 6L, breathing is labored, able to speak in short sentences. Pt was given Atrovent, Albuterol, Solumedrol and Epi by EMS and has hx of intubation for sob 1 year ago.

## 2016-01-27 NOTE — ED Notes (Signed)
Dinner Tray (Heart Healthy) ordered @ 1610.

## 2016-01-27 NOTE — ED Provider Notes (Signed)
CSN: [id number]     Arrival date & time 01/27/16  0441 History   First MD Initiated Contact with Patient 01/27/16 0445     Chief Complaint  Patient presents with  . Asthma  . Shortness of Breath      HPI  Patient presents evaluation of difficulty breathing. History of asthma. Is on home medications. States she run out of several of them. History of admission. Was intubated "a year ago" per her report area  She's had a cold and congestion. No chest pain. No peripheral edema. No fevers or chills. Did receive a flu vaccine this year.  Prehospital course, was given a GI 50-year-old 10 mg, site Medrol 125 mg IV, IM subcutaneous epi 10-998.3 mL, and mag sulfate 2 g IV. She has improved per paramedic report, and per her report  Past Medical History  Diagnosis Date  . Arthritis   . Asthma   . Hypertension   . Influenza B April 2014    Complicated by multi-organ failure  . 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 OSA (obstructive sleep apnea) 03/20/2013  . Required emergent intubation     asthma exacerbation in 2016   Past Surgical History  Procedure Laterality Date  . Tubal ligation    . Cesarean section     Family History  Problem Relation Age of Onset  . Hypertension Mother   . HIV/AIDS Father    Social History  Substance Use Topics  . Smoking status: Former Smoker -- 0.25 packs/day for 20 years    Types: Cigarettes    Quit date: 01/31/2013  . Smokeless tobacco: Never Used  . Alcohol Use: No   OB History    No data available     Review of Systems  Constitutional: Negative for fever, chills, diaphoresis, appetite change and fatigue.  HENT: Negative for mouth sores, sore throat and trouble swallowing.   Eyes: Negative for visual disturbance.  Respiratory: Positive for shortness of breath and wheezing. Negative for cough and chest tightness.   Cardiovascular: Negative for chest pain.  Gastrointestinal: Negative for nausea, vomiting, abdominal pain, diarrhea and abdominal  distention.  Endocrine: Negative for polydipsia, polyphagia and polyuria.  Genitourinary: Negative for dysuria, frequency and hematuria.  Musculoskeletal: Negative for gait problem.  Skin: Negative for color change, pallor and rash.  Neurological: Negative for dizziness, syncope, light-headedness and headaches.  Hematological: Does not bruise/bleed easily.  Psychiatric/Behavioral: Negative for behavioral problems and confusion.      Allergies  Latex; Other; Tomato; and Wool alcohol  Home Medications   Prior to Admission medications   Medication Sig Start Date End Date Taking? Authorizing Provider  acetaminophen (TYLENOL) 500 MG tablet Take 1,000 mg by mouth every 6 (six) hours as needed for moderate pain.    Historical Provider, MD  albuterol (PROVENTIL) (2.5 MG/3ML) 0.083% nebulizer solution Take 3 mLs (2.5 mg total) by nebulization every 2 (two) hours as needed for wheezing or shortness of breath. 07/27/15   Rhetta MuraJai-Gurmukh Samtani, MD  albuterol (VENTOLIN HFA) 108 (90 BASE) MCG/ACT inhaler Inhale 2 puffs into the lungs every 6 (six) hours as needed for wheezing. 07/27/15   Rhetta MuraJai-Gurmukh Samtani, MD  budesonide (PULMICORT) 0.25 MG/2ML nebulizer solution Take 2 mLs (0.25 mg total) by nebulization 2 (two) times daily. 08/12/15   Catarina Hartshornavid Tat, MD  ferrous sulfate 325 (65 FE) MG tablet Take 1 tablet (325 mg total) by mouth 2 (two) times daily with a meal. 08/12/15   Catarina Hartshornavid Tat, MD  fluconazole (DIFLUCAN) 100 MG  tablet Take 1 tablet (100 mg total) by mouth once. 11/22/15   Leroy Sea, MD  fluticasone (FLONASE) 50 MCG/ACT nasal spray Place 2 sprays into the nose daily. 02/21/13   Bernadene Person, NP  loratadine (CLARITIN) 10 MG tablet Take 1 tablet (10 mg total) by mouth daily. 08/12/15   Catarina Hartshorn, MD  oxymetazoline (AFRIN) 0.05 % nasal spray Place 2 sprays into both nostrils 2 (two) times daily. X 2 days, then bid prn wheezing and congestion 08/12/15   Catarina Hartshorn, MD  predniSONE (DELTASONE) 5 MG  tablet Label  & dispense according to the schedule below. 10 Pills PO for 3 days then, 8 Pills PO for 3 days, 6 Pills PO for 3 days, 4 Pills PO for 3 days, 2 Pills PO for 3 days, 1 Pills PO for 3 days, 1/2 Pill  PO for 3 days then STOP. Total 95 pills. 11/22/15   Leroy Sea, MD  pseudoephedrine (SUDAFED) 120 MG 12 hr tablet Take 1 tablet (120 mg total) by mouth 2 (two) times daily. Patient not taking: Reported on 09/02/2015 08/12/15   Catarina Hartshorn, MD  sodium chloride (OCEAN) 0.65 % SOLN nasal spray Place 1 spray into both nostrils 4 (four) times daily. Patient not taking: Reported on 11/21/2015 08/12/15   Catarina Hartshorn, MD  traMADol (ULTRAM) 50 MG tablet Take 1 tablet (50 mg total) by mouth every 6 (six) hours as needed. Patient not taking: Reported on 09/02/2015 08/12/15   Catarina Hartshorn, MD  Vitamin D, Ergocalciferol, (DRISDOL) 50000 UNITS CAPS capsule Take 1 capsule (50,000 Units total) by mouth every 7 (seven) days. On thursdays. Patient not taking: Reported on 09/02/2015 12/29/14   Clydia Llano, MD   BP 108/59 mmHg  Pulse 98  Resp 19  SpO2 99%  LMP 12/30/2015 Physical Exam  Constitutional: She is oriented to person, place, and time. She appears well-developed and well-nourished. No distress.  HENT:  Head: Normocephalic.  Eyes: Conjunctivae are normal. Pupils are equal, round, and reactive to light. No scleral icterus.  Neck: Normal range of motion. Neck supple. No thyromegaly present.  Cardiovascular: Normal rate and regular rhythm.  Exam reveals no gallop and no friction rub.   No murmur heard. Pulmonary/Chest: Effort normal and breath sounds normal. No respiratory distress. She has no wheezes. She has no rales.  Abdominal: Soft. Bowel sounds are normal. She exhibits no distension. There is no tenderness. There is no rebound.  Musculoskeletal: Normal range of motion.  Neurological: She is alert and oriented to person, place, and time.  Skin: Skin is warm and dry. No rash noted.  Psychiatric:  She has a normal mood and affect. Her behavior is normal.    ED Course  Procedures (including critical care time) Labs Review Labs Reviewed  CBC WITH DIFFERENTIAL/PLATELET - Abnormal; Notable for the following:    RDW 16.4 (*)    All other components within normal limits  BASIC METABOLIC PANEL - Abnormal; Notable for the following:    Glucose, Bld 115 (*)    Calcium 8.7 (*)    All other components within normal limits  I-STAT ARTERIAL BLOOD GAS, ED - Abnormal; Notable for the following:    pH, Arterial 7.328 (*)    pCO2 arterial 48.0 (*)    pO2, Arterial 131.0 (*)    Bicarbonate 25.2 (*)    All other components within normal limits    Imaging Review Dg Chest Port 1 View  01/27/2016  CLINICAL DATA:  Shortness of breath  for 2 days. EXAM: PORTABLE CHEST 1 VIEW COMPARISON:  11/20/2015 FINDINGS: The cardiomediastinal contours are normal. Mild chronic bronchitic change. Pulmonary vasculature is normal. No consolidation, pleural effusion, or pneumothorax. No acute osseous abnormalities are seen. IMPRESSION: Mild chronic bronchitic change.  No superimposed acute process. Electronically Signed   By: Rubye Oaks M.D.   On: 01/27/2016 06:26   I have personally reviewed and evaluated these images and lab results as part of my medical decision-making.   EKG Interpretation None      MDM   Final diagnoses:  Status asthmaticus, moderate persistent    CRITICAL CARE Performed by: Rolland Porter JOSEPH   Total critical care time: 55 minutes  Critical care time was exclusive of separately billable procedures and treating other patients.  Critical care was necessary to treat or prevent imminent or life-threatening deterioration.  Critical care was time spent personally by me on the following activities: development of treatment plan with patient and/or surrogate as well as nursing, discussions with consultants, evaluation of patient's response to treatment, examination of patient,  obtaining history from patient or surrogate, ordering and performing treatments and interventions, ordering and review of laboratory studies, ordering and review of radiographic studies, pulse oximetry and re-evaluation of patient's condition.  Patient improving clinically, both subjectively and objectively. Continues with prolongation and wheezing. Less dyspnea subjectively. Continuing to be well oxygenated on nebulized albuterol through all oxygen. Discussed with tried hospitalist. Patient will be admitted. ABG does not show retention. Clinically comment per ABG does not show respiratory failure.     Rolland Porter, MD 01/27/16 [phone]

## 2016-01-27 NOTE — ED Notes (Signed)
Pt just returned from the bathroom  Some sob with exertion

## 2016-01-27 NOTE — ED Notes (Signed)
Report called to rn on 3e 

## 2016-01-27 NOTE — H&P (Signed)
[facility] History and Physical  Kathleen Rodriguez ZOX:[id number] DOB: 1966-04-19 DOA: 01/27/2016  Referring physician: carryover PCP: No PCP Per Patient   Chief Complaint: sob  HPI: Kathleen AbideLouise Rodriguez is a 50 y.o. female past medical history of asthma, obstructive sleep apnea, hypertension, morbid obesity presents to emergency Department chief complaint difficulty breathing. Initial evaluation reveals acute respiratory distress likely related to asthma exacerbation.   Information is obtained from the patient. She states that she "developed a chest cold" about a week ago. In addition she relates that she has run out of her home medications which include inhalers and nebulizers. She reports worsening shortness of breath initially with exertion and this morning with rest. Associated symptoms include cough somewhat productive thick yellow sputum, chest pain with coughing, chills but no fever. She denies abdominal pain nausea vomiting diarrhea headache dizziness syncope or near-syncope. She denies dysuria hematuria frequency or urgency. Reports she's been in the hospital "several times" for asthma exacerbation and has been intubated in the past about a year ago. This morning she called EMS due to respiratory distress she was given Solu-Medrol nebulizer mag sulfate.  At the time of admission she is afebrile hemodynamically stable and not hypoxic. He has received continuous nebs.  Review of Systems:  10 point review of systems complete and all systems are negative except as indicated in the history of present illness.   Past Medical History  Diagnosis Date  . Arthritis   . Asthma   . Hypertension   . Influenza B April 2014    Complicated by multi-organ failure  . 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 OSA (obstructive sleep apnea) 03/20/2013  . Required emergent intubation     asthma exacerbation in 2016   Past Surgical History  Procedure Laterality Date  . Tubal ligation    . Cesarean  section     Social History:  reports that she quit smoking about 2 years ago. Her smoking use included Cigarettes. She has a 5 pack-year smoking history. She has never used smokeless tobacco. She reports that she does not drink alcohol or use illicit drugs. She is unemployed she lives at home with her 2 sons she is independent with ADLs Allergies  Allergen Reactions  . Latex Itching and Rash  . Other Hives and Itching    Ketchup  . Tomato Hives and Itching  . Wool Alcohol [Lanolin] Itching    Family History  Problem Relation Age of Onset  . Hypertension Mother   . HIV/AIDS Father      Prior to Admission medications   Medication Sig Start Date End Date Taking? Authorizing Provider  acetaminophen (TYLENOL) 500 MG tablet Take 1,000 mg by mouth every 6 (six) hours as needed for moderate pain.   Yes Historical Provider, MD  albuterol (PROVENTIL) (2.5 MG/3ML) 0.083% nebulizer solution Take 3 mLs (2.5 mg total) by nebulization every 2 (two) hours as needed for wheezing or shortness of breath. 07/27/15  Yes Rhetta MuraJai-Gurmukh Samtani, MD  albuterol (VENTOLIN HFA) 108 (90 BASE) MCG/ACT inhaler Inhale 2 puffs into the lungs every 6 (six) hours as needed for wheezing. 07/27/15  Yes Rhetta MuraJai-Gurmukh Samtani, MD  budesonide (PULMICORT) 0.25 MG/2ML nebulizer solution Take 2 mLs (0.25 mg total) by nebulization 2 (two) times daily. 08/12/15  Yes Catarina Hartshornavid Tat, MD  fluticasone (FLONASE) 50 MCG/ACT nasal spray Place 2 sprays into the nose daily. 02/21/13  Yes Bernadene PersonKathryn A Whiteheart, NP   Physical Exam: Filed Vitals:   01/27/16 0700 01/27/16 0715 01/27/16 0716 01/27/16  0730  BP: 120/70 136/60 136/60 127/70  Pulse: 112 96 98 106  Temp:   98.2 F (36.8 C)   TempSrc:   Oral   Resp: SpO2: 99% 100% 100% 100%    Wt Readings from Last 3 Encounters:  11/21/15 116.3 kg (256 lb 6.3 oz)  09/02/15 122.018 kg (269 lb)  08/19/15 122.471 kg (270 lb)    General:  Appears calm and comfortable Eyes: PERRL, normal  lids, irises & conjunctiva ENT: grossly normal hearing, lips & tongue Neck: no LAD, masses or thyromegaly Cardiovascular: RRR, no m/r/g. No LE edema.  Respiratory: Mild increased work of breathing with conversation. Breath sounds quite diminished throughout worse on the right particularly on inspiration. I do hear end-expiratory wheezing as well as diffuse rhonchi I hear no crackles Abdomen: soft, ntnd positive bowel sounds no guarding Skin: no rash or induration seen on limited exam Musculoskeletal: grossly normal tone BUE/BLE Psychiatric: grossly normal mood and affect, speech fluent and appropriate Neurologic: grossly non-focal. Speech clear facial symmetry           Labs on Admission:  Basic Metabolic Panel:  Recent Labs Lab 01/27/16 0501  NA 141  K 4.1  CL 104  CO2 26  GLUCOSE 115*  BUN 10  CREATININE 0.88  CALCIUM 8.7*   Liver Function Tests: No results for input(s): AST, ALT, ALKPHOS, BILITOT, PROT, ALBUMIN in the last 168 hours. No results for input(s): LIPASE, AMYLASE in the last 168 hours. No results for input(s): AMMONIA in the last 168 hours. CBC:  Recent Labs Lab 01/27/16 0501  WBC 7.7  NEUTROABS 2.8  HGB 12.3  HCT 38.5  MCV 86.3  PLT 290   Cardiac Enzymes: No results for input(s): CKTOTAL, CKMB, CKMBINDEX, TROPONINI in the last 168 hours.  BNP (last 3 results)  Recent Labs  08/10/15 0110  BNP 15.4    ProBNP (last 3 results) No results for input(s): PROBNP in the last 8760 hours.  CBG: No results for input(s): GLUCAP in the last 168 hours.  Radiological Exams on Admission: Dg Chest Port 1 View  01/27/2016  CLINICAL DATA:  Shortness of breath for 2 days. EXAM: PORTABLE CHEST 1 VIEW COMPARISON:  11/20/2015 FINDINGS: The cardiomediastinal contours are normal. Mild chronic bronchitic change. Pulmonary vasculature is normal. No consolidation, pleural effusion, or pneumothorax. No acute osseous abnormalities are seen. IMPRESSION: Mild chronic  bronchitic change.  No superimposed acute process. Electronically Signed   By: Rubye Oaks M.D.   On: 01/27/2016 06:26    EKG:   Assessment/Plan Principal Problem:   Acute respiratory distress (HCC) Active Problems:   Uncontrolled persistent asthma   OSA (obstructive sleep apnea)   Hypertension   Asthma exacerbation   GERD (gastroesophageal reflux disease)   Morbid obesity (HCC)   Asthma with status asthmaticus  #1. Acute respiratory distress related to asthma exacerbation. Chest x-ray with mild chronic bronchitic changes. ABG pH 7.32, PCO2 48 PO2 131. No chest pain. Chart review indicates she was seen by pulmonology November of last year. Due for follow-up. Note also indicates upper airway cough syndrome -Admit to telemetry -Provide scheduled nebulizers -Solu-Medrol 60 mg every 6 hours taper as indicated -Mucinex for cough -Azithromycin  #2. Asthma exacerbation in the setting of upper airway cough syndrome/asthma. Pulmonary progress note indicates GERD/ar are triggers. She reports she has run out of medicines. Home regimen includes budesonide nebulizer twice a day with albuterol as rescue, delsym, Zantac 150 mg at bedtime per pulmonary  note -Nebulizers as noted above -Transition to home regimen per pulmonary note -Pepcid daily at bedtime -flonase -loratidine -If no improvement pulmonary consult while inpatient -Close outpatient follow-up at discharge  #3. Obstructive sleep apnea. Patient on cpap at home. Chart review indicates goes to wear at least 6 hours each night for pulmonology progress note as well as weight loss -cpap per respiratory -nutritional consult for weight loss  #4. Morbid obesity. bmi greater than 30 -Nutritional consult   #5. Hypertension. No antihypertensives noted in chart. Blood pressure currently controlled  - monitor -  Code Status: full DVT Prophylaxis: Family Communication: none present Disposition Plan: home hopefully 24-36 hours  Time  spent: 55 minutes  [facility]

## 2016-01-27 NOTE — Progress Notes (Signed)
Placed patient on continuous nebulizer treatment to deliver 20mg /hr of albuterol over a four hour period.

## 2016-01-27 NOTE — ED Notes (Signed)
Breakfast tray ordered 

## 2016-01-27 NOTE — ED Notes (Signed)
Pt SpO2 86-90% on RA. Pt placed on 4L [place] and SpO2 improved to 93-95%. Pt reports heart "feeling as if it is racing after breathing treatment." Pt resting comfortably in stretcher.

## 2016-01-28 DIAGNOSIS — G4733 Obstructive sleep apnea (adult) (pediatric): Secondary | ICD-10-CM

## 2016-01-28 DIAGNOSIS — J45901 Unspecified asthma with (acute) exacerbation: Secondary | ICD-10-CM

## 2016-01-28 LAB — CBC
HEMATOCRIT: 37.6 % (ref 36.0–46.0)
Hemoglobin: 11.8 g/dL — ABNORMAL LOW (ref 12.0–15.0)
MCH: 27 pg (ref 26.0–34.0)
MCHC: 31.4 g/dL (ref 30.0–36.0)
MCV: 86 fL (ref 78.0–100.0)
PLATELETS: 301 10*3/uL (ref 150–400)
RBC: 4.37 MIL/uL (ref 3.87–5.11)
RDW: 16.9 % — ABNORMAL HIGH (ref 11.5–15.5)
WBC: 23 10*3/uL — ABNORMAL HIGH (ref 4.0–10.5)

## 2016-01-28 MED ORDER — AZITHROMYCIN 500 MG PO TABS
1000.0000 mg | ORAL_TABLET | Freq: Once | ORAL | Status: AC
  Administered 2016-01-28: 1000 mg via ORAL
  Filled 2016-01-28: qty 2

## 2016-01-28 MED ORDER — LIDOCAINE HCL (PF) 1 % IJ SOLN
INTRAMUSCULAR | Status: AC
  Administered 2016-01-28: 10:00:00
  Filled 2016-01-28: qty 5

## 2016-01-28 MED ORDER — METRONIDAZOLE 500 MG PO TABS
500.0000 mg | ORAL_TABLET | Freq: Two times a day (BID) | ORAL | Status: DC
Start: 2016-01-28 — End: 2016-03-09

## 2016-01-28 MED ORDER — GUAIFENESIN ER 600 MG PO TB12: 600.0000 mg | ORAL_TABLET | Freq: Two times a day (BID) | ORAL | Status: DC

## 2016-01-28 MED ORDER — ALBUTEROL SULFATE HFA 108 (90 BASE) MCG/ACT IN AERS: 2.0000 | INHALATION_SPRAY | Freq: Four times a day (QID) | RESPIRATORY_TRACT | Status: DC | PRN

## 2016-01-28 MED ORDER — FAMOTIDINE 20 MG PO TABS: 20.0000 mg | ORAL_TABLET | Freq: Every day | ORAL | Status: DC

## 2016-01-28 MED ORDER — LORATADINE 10 MG PO TABS: 10.0000 mg | ORAL_TABLET | Freq: Every day | ORAL | Status: DC

## 2016-01-28 MED ORDER — METRONIDAZOLE 500 MG PO TABS
500.0000 mg | ORAL_TABLET | Freq: Two times a day (BID) | ORAL | Status: DC
  Administered 2016-01-28: 500 mg via ORAL
  Filled 2016-01-28: qty 1

## 2016-01-28 MED ORDER — ALBUTEROL SULFATE (2.5 MG/3ML) 0.083% IN NEBU: 2.5000 mg | INHALATION_SOLUTION | RESPIRATORY_TRACT | Status: DC | PRN

## 2016-01-28 MED ORDER — CEFTRIAXONE SODIUM 500 MG IJ SOLR
250.0000 mg | Freq: Once | INTRAMUSCULAR | Status: AC
  Administered 2016-01-28: 250 mg via INTRAMUSCULAR
  Filled 2016-01-28: qty 500

## 2016-01-28 MED ORDER — PREDNISONE 10 MG PO TABS: ORAL_TABLET | ORAL | Status: DC

## 2016-01-28 NOTE — Progress Notes (Signed)
Patient requesting discharge-she feels much better and feels that she can manage herself at home-she has nebs at home and is asking for a steroid taper. On exam-moving air well-no rhonchi.  Also note-she claims that her husband was diagnosed with STD (thinks it was trichomoniasis) and she had unprotected intercourse. She is requesting treatment for this. Will check HIV, Urine for GC/Chlamydia. Will treat with 1 dose of Rocephin/Zithrom and 7 day course of Flagyl.  Have asked patient to follow results of HIV, Urine G/C with PCP. She will be discharged home at her own request. See d/c summary for details.

## 2016-01-28 NOTE — Progress Notes (Signed)
Pt has orders to be discharged. Discharge instructions given and pt has no additional questions at this time. Medication regimen reviewed and pt educated. Pt notified that she needs to [facility]. Appointments have been made for patients. Pt verbalized understanding and has no additional questions. Telemetry box removed. IV removed and site in good condition. Pt stable and waiting for transportation.

## 2016-01-28 NOTE — Care Management Note (Signed)
Case Management Note  Patient Details  Name: Kathleen Rodriguez MRN: [id number] Date of Birth: 06/25/1966  Subjective/Objective:      Pt admitted with Acute Respiratory Distress               Action/Plan:  PTA - pt independent for home with husband.  [facility] - pt verified that she gets medicaitons from pharmacy and sometimes she receives samples from Pulmonary Dr Craige CottaSood - pt denied not being able to obtain medications.  Pt also stated she has received [facility]; however she states she hasnt been able to fill it out as of yet , CM encouraged pt to keep appts with clinic,  Follow through with transportation process and to make sure she picks up prescriptions on time to eliminate/reduce exacerbations.  Pt received MATCH within the past year .  No other CM needs determined.     Expected Discharge Date:                  Expected Discharge Plan:  Home/Self Care  In-House Referral:     Discharge planning Services  CM Consult, [facility]  Post Acute Care Choice:    Choice offered to:     DME Arranged:    DME Agency:     HH Arranged:    HH Agency:     Status of Service:  Completed, signed off  Medicare Important Message Given:    Date Medicare IM Given:    Medicare IM give by:    Date Additional Medicare IM Given:    Additional Medicare Important Message give by:     If discussed at Long Length of Stay Meetings, dates discussed:    Additional Comments:  Kathleen Rodriguez, Kathleen Cervi S, RN 01/28/2016, 10:25 AM

## 2016-01-28 NOTE — Discharge Summary (Signed)
PATIENT DETAILS Name: Kathleen Rodriguez Age: 50 y.o. Sex: female Date of Birth: Oct 25, 1965 MRN: [id number]. Admitting Physician: Hillary Bow, DO NWG:[id number], Venetia Night, MD  Admit Date: 01/27/2016 Discharge date: 01/28/2016  Recommendations for Outpatient Follow-up:  1. Complete 1 week of Flagyl for presumed exposure to Trichomoniasis 2. Follow HIV, G/C Urine 3. Ensure follow up with Dr Sood-Pulmonology 4. Please repeat CBC/BMET at next visit  PRIMARY DISCHARGE DIAGNOSIS:  Principal Problem:   Acute respiratory distress (HCC) Active Problems:   Uncontrolled persistent asthma   OSA (obstructive sleep apnea)   Hypertension   Asthma exacerbation   GERD (gastroesophageal reflux disease)   Morbid obesity (HCC)   Sinus tachycardia (HCC)   Asthma with status asthmaticus      PAST MEDICAL HISTORY: Past Medical History  Diagnosis Date  . Asthma   . Influenza B April 2014    Complicated by multi-organ failure  . 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 Required emergent intubation     asthma exacerbation in 2016  . Hypertension     "doctor took me off RX in 2016" (01/27/2016)  . CHF (congestive heart failure) (HCC) 2016    "when I went into a coma"  . Pneumonia 2016  . OSA on CPAP "since " 03/20/2013  . Arthritis     "knees, lower back; legs, ankles" (01/27/2016)    DISCHARGE MEDICATIONS: Current Discharge Medication List    START taking these medications   Details  famotidine (PEPCID) 20 MG tablet Take 1 tablet (20 mg total) by mouth at bedtime. Qty: 30 tablet, Refills: 0    guaiFENesin (MUCINEX) 600 MG 12 hr tablet Take 1 tablet (600 mg total) by mouth 2 (two) times daily. Qty: 15 tablet, Refills: 0    loratadine (CLARITIN) 10 MG tablet Take 1 tablet (10 mg total) by mouth daily. Qty: 15 tablet, Refills: 0    metroNIDAZOLE (FLAGYL) 500 MG tablet Take 1 tablet (500 mg total) by mouth 2 (two) times daily. Qty: 13 tablet, Refills: 0    predniSONE (DELTASONE) 10 MG tablet  Take 4 tablets (40 mg) daily for 2 days, then, Take 3 tablets (30 mg) daily for 2 days, then, Take 2 tablets (20 mg) daily for 2 days, then, Take 1 tablets (10 mg) daily for 1 days, then stop Qty: 19 tablet, Refills: 0      CONTINUE these medications which have CHANGED   Details  albuterol (PROVENTIL) (2.5 MG/3ML) 0.083% nebulizer solution Take 3 mLs (2.5 mg total) by nebulization every 2 (two) hours as needed for wheezing or shortness of breath. Qty: 75 mL, Refills: 0    albuterol (VENTOLIN HFA) 108 (90 Base) MCG/ACT inhaler Inhale 2 puffs into the lungs every 6 (six) hours as needed for wheezing. Qty: 1 Inhaler, Refills: 0      CONTINUE these medications which have NOT CHANGED   Details  acetaminophen (TYLENOL) 500 MG tablet Take 1,000 mg by mouth every 6 (six) hours as needed for moderate pain.    budesonide (PULMICORT) 0.25 MG/2ML nebulizer solution Take 2 mLs (0.25 mg total) by nebulization 2 (two) times daily. Qty: 60 mL, Refills: 2    fluticasone (FLONASE) 50 MCG/ACT nasal spray Place 2 sprays into the nose daily. Qty: 16 g, Refills: 0        ALLERGIES:   Allergies  Allergen Reactions  . Latex Itching and Rash  . Other Hives and Itching    Ketchup  . Tomato Hives and Itching  . Wool Alcohol [  Lanolin] Itching    BRIEF HPI:  See H&P, Labs, Consult and Test reports for all details in brief, patient  is a 50 y.o. female with a Past Medical History of severe asthma, OSA, HTN who presented with asthma exacerbation  CONSULTATIONS:   None  PERTINENT RADIOLOGIC STUDIES: Dg Chest Port 1 View  01/27/2016  CLINICAL DATA:  Shortness of breath for 2 days. EXAM: PORTABLE CHEST 1 VIEW COMPARISON:  11/20/2015 FINDINGS: The cardiomediastinal contours are normal. Mild chronic bronchitic change. Pulmonary vasculature is normal. No consolidation, pleural effusion, or pneumothorax. No acute osseous abnormalities are seen. IMPRESSION: Mild chronic bronchitic change.  No superimposed  acute process. Electronically Signed   By: Rubye Oaks M.D.   On: 01/27/2016 06:26     PERTINENT LAB RESULTS: CBC:  Recent Labs  01/27/16 0501 01/28/16 0509  WBC 7.7 23.0*  HGB 12.3 11.8*  HCT 38.5 37.6  PLT 290 301   CMET CMP     Component Value Date/Time   NA 141 01/27/2016 0501   K 4.1 01/27/2016 0501   CL 104 01/27/2016 0501   CO2 26 01/27/2016 0501   GLUCOSE 115* 01/27/2016 0501   BUN 10 01/27/2016 0501   CREATININE 0.88 01/27/2016 0501   CALCIUM 8.7* 01/27/2016 0501   PROT 6.8 08/10/2015 0110   ALBUMIN 3.2* 08/10/2015 0110   AST 34 08/10/2015 0110   ALT 27 08/10/2015 0110   ALKPHOS 56 08/10/2015 0110   BILITOT 0.1* 08/10/2015 0110   GFRNONAA >60 01/27/2016 0501   GFRAA >60 01/27/2016 0501    GFR Estimated Creatinine Clearance: 100 mL/min (by C-G formula based on Cr of 0.88). No results for input(s): LIPASE, AMYLASE in the last 72 hours. No results for input(s): CKTOTAL, CKMB, CKMBINDEX, TROPONINI in the last 72 hours. Invalid input(s): POCBNP No results for input(s): DDIMER in the last 72 hours. No results for input(s): HGBA1C in the last 72 hours. No results for input(s): CHOL, HDL, LDLCALC, TRIG, CHOLHDL, LDLDIRECT in the last 72 hours.  Recent Labs  01/27/16 1945  TSH 0.601   No results for input(s): VITAMINB12, FOLATE, FERRITIN, TIBC, IRON, RETICCTPCT in the last 72 hours. Coags: No results for input(s): INR in the last 72 hours.  Invalid input(s): PT Microbiology: No results found for this or any previous visit (from the past 240 hour(s)).   BRIEF HOSPITAL COURSE:   Principal Problem:  Asthma exacerbation:presented with Shortness of breath-felt to be 2/2 Asthma exac-started on IV Steroids, Scheduled nebulized bronchodilators and empiric Zithromax. Rapidly improved overnight, this morning during rounds-her lungs are completely clear. Patient reports significant clinical improvement and is requesting discharge. Patient claims she is very  familiar with treating asthma-she has a nebulizer at home and is requesting prednisone taper. She feels that she can safely manage herself at home. Since she is clinically improved, lungs are clear and is now on room air-she is being discharged home at her own request. She will be placed on tapering prednisone, do not feel she requires Abx on discharge.   Active Problems: Possible exposure to Trichomoniasis:she claims that her husband was diagnosed with STD (thinks it was trichomoniasis) and she had unprotected intercourse. She is requesting treatment for this. Will check HIV, Urine for GC/Chlamydia.Will treat with 1 dose of Rocephin/Zithrom and 7 day course of Flagyl. Have asked patient to follow results of HIV, Urine G/C with PCP.  OSA (obstructive sleep apnea):continue CPAP  Morbid obesity:counseled regarding importance of weight loss.  TODAY-DAY OF DISCHARGE:  Subjective:  Kathleen AbideLouise Mcglasson today has no headache,no chest abdominal pain,no new weakness tingling or numbness, feels much better wants to go home today.   Objective:   Blood pressure 134/64, pulse 90, temperature 97.4 F (36.3 C), temperature source Oral, resp. rate 18, height 5\' 5"  (1.651 m), weight 121.428 kg (267 lb 11.2 oz), last menstrual period 12/30/2015, SpO2 96 %.  Intake/Output Summary (Last 24 hours) at 01/28/16 1000 Last data filed at 01/28/16 0917  Gross per 24 hour  Intake    120 ml  Output    425 ml  Net   -305 ml   Filed Weights   01/27/16 1923 01/28/16 0434  Weight: 121.201 kg (267 lb 3.2 oz) 121.428 kg (267 lb 11.2 oz)    Exam Awake Alert, Oriented *3, No new F.N deficits, Normal affect [place].AT,PERRAL Supple Neck,No JVD, No cervical lymphadenopathy appriciated.  Symmetrical Chest wall movement, Good air movement bilaterally, CTAB RRR,No Gallops,Rubs or new Murmurs, No Parasternal Heave +ve B.Sounds, Abd Soft, Non tender, No organomegaly appriciated, No rebound -guarding or rigidity. No Cyanosis,  Clubbing or edema, No new Rash or bruise  DISCHARGE CONDITION: Stable  DISPOSITION: Home  DISCHARGE INSTRUCTIONS:    Activity:  As tolerated with Full fall precautions use walker/cane & assistance as needed  Get Medicines reviewed and adjusted: Please take all your medications with you for your next visit with your Primary MD  Please request your Primary MD to go over all hospital tests and procedure/radiological results at the follow up, please ask your Primary MD to get all Hospital records sent to his/her office.  If you experience worsening of your admission symptoms, develop shortness of breath, life threatening emergency, suicidal or homicidal thoughts you must seek medical attention immediately by calling 911 or calling your MD immediately  if symptoms less severe.  You must read complete instructions/literature along with all the possible adverse reactions/side effects for all the Medicines you take and that have been prescribed to you. Take any new Medicines after you have completely understood and accpet all the possible adverse reactions/side effects.   Do not drive when taking Pain medications.   Do not take more than prescribed Pain, Sleep and Anxiety Medications  Special Instructions: If you have smoked or chewed Tobacco  in the last 2 yrs please stop smoking, stop any regular Alcohol  and or any Recreational drug use.  Wear Seat belts while driving.  Please note  You were cared for by a hospitalist during your hospital stay. Once you are discharged, your primary care physician will handle any further medical issues. Please note that NO REFILLS for any discharge medications will be authorized once you are discharged, as it is imperative that you return to your primary care physician (or establish a relationship with a primary care physician if you do not have one) for your aftercare needs so that they can reassess your need for medications and monitor your lab  values.   Diet recommendation: Regular Diet  Discharge Instructions    Call MD for:  difficulty breathing, headache or visual disturbances    Complete by:  As directed      Diet - low sodium heart healthy    Complete by:  As directed      Increase activity slowly    Complete by:  As directed            Follow-up Information    Follow up with SOOD,VINEET, MD. Schedule an appointment as soon as possible for a visit  in 2 weeks.   Specialty:  Pulmonary Disease   Why:  Hospital follow up   Contact information:   [address] [place] [address] [phone]       Schedule an appointment as soon as possible for a visit with [facility].   Why:  with your MD in 1 week-, Hospital follow up-please follow HIV test, Urine test for Gonoccocus and Chlamydia   Contact information:   [address] [place] [address] [phone]     Total Time spent on discharge equals 45 minutes.  SignedJeoffrey Massed 01/28/2016 10:00 AM

## 2016-01-28 NOTE — Progress Notes (Signed)
Low fall risk and refused bed alarm. Will continue to monitor patient. 

## 2016-01-29 LAB — HIV ANTIBODY (ROUTINE TESTING W REFLEX): HIV SCREEN 4TH GENERATION: NONREACTIVE

## 2016-02-04 ENCOUNTER — Inpatient Hospital Stay: Payer: Self-pay | Admitting: Acute Care

## 2016-02-08 MED FILL — metroNIDAZOLE 500 MG TABS: 500 | 6 days supply | Qty: 13 | Fill #0

## 2016-02-08 MED FILL — ?FAMOTIDINE 20 MG TABLET: 20 | 30 days supply | Qty: 30 | Fill #0

## 2016-02-08 MED FILL — !VENTOLIN HFA INHALER: 108 (90 BAS | 27 days supply | Qty: 18 | Fill #0

## 2016-02-08 MED FILL — ALBUTEROL 0.083% INHAL SOLN: (2.5 MG/3ML | 25 days supply | Qty: 90 | Fill #0

## 2016-02-17 ENCOUNTER — Inpatient Hospital Stay: Payer: Self-pay | Admitting: Family Medicine

## 2016-03-09 ENCOUNTER — Encounter: Payer: Self-pay | Admitting: Family Medicine

## 2016-03-09 ENCOUNTER — Encounter: Payer: Self-pay | Admitting: Adult Health

## 2016-03-09 ENCOUNTER — Ambulatory Visit: Payer: Self-pay | Attending: Family Medicine | Admitting: Family Medicine

## 2016-03-09 ENCOUNTER — Ambulatory Visit (INDEPENDENT_AMBULATORY_CARE_PROVIDER_SITE_OTHER): Payer: Self-pay | Admitting: Adult Health

## 2016-03-09 VITALS — BP 123/86 | HR 95 | Temp 97.7°F | Resp 20 | Ht 66.0 in | Wt 268.8 lb

## 2016-03-09 VITALS — BP 134/86 | HR 90 | Temp 97.5°F | Ht 66.0 in | Wt 269.0 lb

## 2016-03-09 DIAGNOSIS — Z0001 Encounter for general adult medical examination with abnormal findings: Secondary | ICD-10-CM | POA: Insufficient documentation

## 2016-03-09 DIAGNOSIS — G4733 Obstructive sleep apnea (adult) (pediatric): Secondary | ICD-10-CM | POA: Insufficient documentation

## 2016-03-09 DIAGNOSIS — Z79899 Other long term (current) drug therapy: Secondary | ICD-10-CM | POA: Insufficient documentation

## 2016-03-09 DIAGNOSIS — J45909 Unspecified asthma, uncomplicated: Secondary | ICD-10-CM | POA: Insufficient documentation

## 2016-03-09 DIAGNOSIS — J45901 Unspecified asthma with (acute) exacerbation: Secondary | ICD-10-CM

## 2016-03-09 DIAGNOSIS — M1712 Unilateral primary osteoarthritis, left knee: Secondary | ICD-10-CM | POA: Insufficient documentation

## 2016-03-09 DIAGNOSIS — M170 Bilateral primary osteoarthritis of knee: Secondary | ICD-10-CM | POA: Insufficient documentation

## 2016-03-09 DIAGNOSIS — K0889 Other specified disorders of teeth and supporting structures: Secondary | ICD-10-CM | POA: Insufficient documentation

## 2016-03-09 DIAGNOSIS — J45998 Other asthma: Secondary | ICD-10-CM

## 2016-03-09 MED ORDER — ALBUTEROL SULFATE (2.5 MG/3ML) 0.083% IN NEBU: 2.5000 mg | INHALATION_SOLUTION | RESPIRATORY_TRACT | Status: DC | PRN

## 2016-03-09 MED ORDER — ALBUTEROL SULFATE HFA 108 (90 BASE) MCG/ACT IN AERS: 2.0000 | INHALATION_SPRAY | Freq: Four times a day (QID) | RESPIRATORY_TRACT | Status: DC | PRN

## 2016-03-09 MED ORDER — BUDESONIDE 0.25 MG/2ML IN SUSP: 0.2500 mg | Freq: Two times a day (BID) | RESPIRATORY_TRACT | Status: DC

## 2016-03-09 MED ORDER — PREDNISONE 10 MG PO TABS: ORAL_TABLET | ORAL | Status: DC

## 2016-03-09 MED ORDER — TRAMADOL HCL 50 MG PO TABS
50.0000 mg | ORAL_TABLET | Freq: Three times a day (TID) | ORAL | Status: DC | PRN
Start: 2016-03-09 — End: 2016-05-15

## 2016-03-09 MED ORDER — AMOXICILLIN 500 MG PO CAPS: 500.0000 mg | ORAL_CAPSULE | Freq: Three times a day (TID) | ORAL | Status: DC

## 2016-03-09 MED ORDER — NAPROXEN 500 MG PO TABS: 500.0000 mg | ORAL_TABLET | Freq: Two times a day (BID) | ORAL | Status: DC

## 2016-03-09 MED FILL — traMADol HCL 50 MG TABS: 50 | 20 days supply | Qty: 60 | Fill #0

## 2016-03-09 MED FILL — AMOXICILLIN 500 MG CAPSULE: 500 | 10 days supply | Qty: 30 | Fill #0

## 2016-03-09 MED FILL — NAPROXEN 500 MG TABLET: 500 | 30 days supply | Qty: 60 | Fill #0

## 2016-03-09 MED FILL — predniSONE 10 MG TABS: 10 | 8 days supply | Qty: 20 | Fill #0

## 2016-03-09 MED FILL — ALBUTEROL 0.083% INHAL SOLN: (2.5 MG/3ML | 7 days supply | Qty: 90 | Fill #0

## 2016-03-09 MED FILL — VENTOLIN HFA 90 MCG INHALER: 108 (90 BAS | 25 days supply | Qty: 18 | Fill #0

## 2016-03-09 NOTE — Patient Instructions (Signed)

## 2016-03-09 NOTE — Progress Notes (Signed)
   Subjective:    Patient ID: Kathleen Rodriguez, female    DOB: 08/22/1966, 50 y.o.   MRN: [id number]  HPI 50 yo former smoker with chronic cough, upper airway cough syndrome, possible asthma, OSA,     03/09/2016 Acute OV : Cough  Pt presents for an acute office visit.  Complains of wheezing, SOB with activity, chest tightness, prod cough with clear mucus x 3 days.  Denies any chest congestion, sinus pressure/drainage, fever, nausea or vomiting.  She is getting rx thru [facility]. Has follow up ov with [facility] today.  She is now on Budesonide Neb Twice daily. Needs refill.  She is on CPAP for OSA , says she is wearing more regularly . We disucssed compliance and wt loss.  Admitted x 2 last 6 month for 6 months. Tx with prednisone.    TESTS: 02/05/13 CT chest >> no PE, no acute findings 02/11/13 Labs >> ANA negative, Anti-GBM Ab < 1, ANCA negative PSG 04/19/13 >> AHI 16.4, SpO2 low 84%. PFT 05/15/13 >> FEV1 1.90 (76%), FEV1% 72, TLC 4.23 (81%), DLCO 89%, no BD Echo 04/06/14 >> mod LVH, EF 65 to 70%    Review of Systems Constitutional:   No  weight loss, night sweats,  Fevers, chills,  +fatigue, or  lassitude.  HEENT:   No headaches,  Difficulty swallowing,  Tooth/dental problems, or  Sore throat,                No sneezing, itching, ear ache, ++ nasal congestion, post nasal drip,   CV:  No chest pain,  Orthopnea, PND, swelling in lower extremities, anasarca, dizziness, palpitations, syncope.   GI  No heartburn, indigestion, abdominal pain, nausea, vomiting, diarrhea, change in bowel habits, loss of appetite, bloody stools.   Resp:  No coughing up of blood.  No change in color of mucus.  No wheezing.  No chest wall deformity  Skin: no rash or lesions.  GU: no dysuria, change in color of urine, no urgency or frequency.  No flank pain, no hematuria   MS:  No joint pain or swelling.  No decreased range of motion.  +back pain.  Psych:  No change in mood or affect. No  depression or anxiety.  No memory loss.         Objective:   Physical Exam   Filed Vitals:   03/09/16 0938  BP: 134/86  Pulse: 90  Temp: 97.5 F (36.4 C)  TempSrc: Oral  Height: 5\' 6"  (1.676 m)  Weight: 269 lb (122.018 kg)  SpO2: 98%    GEN: A/Ox3; pleasant , NAD, obese   HEENT:  [place]/AT,  EACs-clear, TMs-wnl, NOSE-clear drainage  THROAT-clear, no lesions, no postnasal drip or exudate noted. Class 2 MP airway/.   NECK:  Supple w/ fair ROM; no JVD; normal carotid impulses w/o bruits; no thyromegaly or nodules palpated; no lymphadenopathy.  RESP Few exp wheezing, no accessory muscle use, no dullness to percussion  CARD:  RRR, no m/r/g  , no peripheral edema, pulses intact, no cyanosis or clubbing.  GI:   Soft & nt; nml bowel sounds; no organomegaly or masses detected.  Musco: Warm bil, no deformities or joint swelling noted.   Neuro: alert, no focal deficits noted.    Skin: Warm, no lesions or rashes    Kathleen Heeg NP-C  [facility]  03/09/2016      Assessment & Plan:

## 2016-03-09 NOTE — Addendum Note (Signed)
Addended by: Karalee HeightOX, Cherry Turlington P on: 03/09/2016 10:29 AM   Modules accepted: Orders

## 2016-03-09 NOTE — Progress Notes (Signed)
Patient is here for HFU  Patient complains of bilateral knee pain scaled currently at a 10. Pain is described as arthritis.  Patient has not taken medication today. Patient has eaten.

## 2016-03-09 NOTE — Patient Instructions (Signed)
Prednisone taper over over next week.  Begin Zantac 150mg  At bedtime   Reflux diet Avoid all Mint  products Use Delsym 2 teaspoons twice daily as needed. For cough Continue on budesonide nebulizer twice daily, rinse after use. Use albuterol nebulizer as rescue only. For wheezing and shortness of breath. Continue on CPAP at bedtime. Goal is to wear at least 6 hours each night Work on weight loss Follow-up with [facility] today as planned Followup with Dr. Craige CottaSood in 3 months with PFT and As needed

## 2016-03-09 NOTE — Assessment & Plan Note (Signed)
Continue on CPAP at bedtime. Goal is to wear at least 6 hours each night Work on weight loss Follow-up with [facility] today as planned Followup with Dr. Craige CottaSood in 3 months with PFT and As needed

## 2016-03-09 NOTE — Progress Notes (Signed)
Reviewed and agree with assessment/plan.  Coralyn HellingVineet Ruslan Mccabe, MD [facility] 03/09/2016, 10:47 AM Pager:  [phone]

## 2016-03-09 NOTE — Assessment & Plan Note (Signed)
Recurrent exacerbation with ?VCD component  Check PFT on return  ? Med noncompliance.   Plan  Prednisone taper over over next week.  Begin Zantac 150mg  At bedtime   Reflux diet Avoid all Mint  products Use Delsym 2 teaspoons twice daily as needed. For cough Continue on budesonide nebulizer twice daily, rinse after use. Use albuterol nebulizer as rescue only. For wheezing and shortness of breath.  Followup with Dr. Craige CottaSood in 3 months with PFT and As needed

## 2016-03-10 NOTE — Progress Notes (Signed)
Subjective:  Patient ID: Kathleen Rodriguez, female    DOB: Mar 28, 1966  Age: 50 y.o. MRN: [id number]  CC: Hospitalization Follow-up   HPI Kathleen Rodriguez is a 50 year old female with a history of obesity, asthma seen by [facility] this morning for recurrent asthma exacerbation with questionable vocal cord dysfunction component. She received a nebulizer treatment at her office visit, was placed on a prednisone taper and commenced on Zantac for possible underlying reflux and a pulmonary function test prior to her next visit; she is yet to pick up the prescriptions which were prescribed. Her recent hospitalization was a month ago for acute respiratory failure for which she also received prednisone taper. Compliance has played a major role in her recurrent asthma exacerbation.  Today she complains of cracking two of her teeth and has been unable to see a dentist due to lack of insurance. Complains of pain in her teeth don't like some antibiotics. Also has pain in both knees and received cortisone injections in the past with no improvement in symptoms. Pain is worse with going up and down stairs  Outpatient Prescriptions Prior to Visit  Medication Sig Dispense Refill  . acetaminophen (TYLENOL) 500 MG tablet Take 1,000 mg by mouth every 6 (six) hours as needed for moderate pain.    Marland Kitchen albuterol (PROVENTIL) (2.5 MG/3ML) 0.083% nebulizer solution Take 3 mLs (2.5 mg total) by nebulization every 2 (two) hours as needed for wheezing or shortness of breath. 75 mL 5  . albuterol (VENTOLIN HFA) 108 (90 Base) MCG/ACT inhaler Inhale 2 puffs into the lungs every 6 (six) hours as needed for wheezing. 1 Inhaler 5  . budesonide (PULMICORT) 0.25 MG/2ML nebulizer solution Take 2 mLs (0.25 mg total) by nebulization 2 (two) times daily. 60 mL 5  . famotidine (PEPCID) 20 MG tablet Take 1 tablet (20 mg total) by mouth at bedtime. 30 tablet 0  . fluticasone (FLONASE) 50 MCG/ACT nasal spray Place 2 sprays into the nose  daily. 16 g 0  . guaiFENesin (MUCINEX) 600 MG 12 hr tablet Take 1 tablet (600 mg total) by mouth 2 (two) times daily. 15 tablet 0  . loratadine (CLARITIN) 10 MG tablet Take 1 tablet (10 mg total) by mouth daily. 15 tablet 0  . predniSONE (DELTASONE) 10 MG tablet 4 tabs for 2 days, then 3 tabs for 2 days, 2 tabs for 2 days, then 1 tab for 2 days, then stop 20 tablet 0  . albuterol (PROVENTIL) (2.5 MG/3ML) 0.083% nebulizer solution Take 3 mLs (2.5 mg total) by nebulization every 2 (two) hours as needed for wheezing or shortness of breath. 75 mL 0  . albuterol (VENTOLIN HFA) 108 (90 Base) MCG/ACT inhaler Inhale 2 puffs into the lungs every 6 (six) hours as needed for wheezing. 1 Inhaler 0  . budesonide (PULMICORT) 0.25 MG/2ML nebulizer solution Take 2 mLs (0.25 mg total) by nebulization 2 (two) times daily. 60 mL 2   No facility-administered medications prior to visit.    ROS Review of Systems  Constitutional: Negative for activity change, appetite change and fatigue.  HENT: Positive for dental problem. Negative for congestion, sinus pressure and sore throat.   Eyes: Negative for visual disturbance.  Respiratory: Positive for shortness of breath and wheezing. Negative for cough and chest tightness.   Cardiovascular: Negative for chest pain and palpitations.  Gastrointestinal: Negative for abdominal pain, constipation and abdominal distention.  Endocrine: Negative for polydipsia.  Genitourinary: Negative for dysuria and frequency.  Musculoskeletal:  See history of present illness  Skin: Negative for rash.  Neurological: Negative for tremors, light-headedness and numbness.  Hematological: Does not bruise/bleed easily.  Psychiatric/Behavioral: Negative for behavioral problems and agitation.    Objective:  BP 123/86 mmHg  Pulse 95  Temp(Src) 97.7 F (36.5 C) (Oral)  Resp 20  Ht 5\' 6"  (1.676 m)  Wt 268 lb 12.8 oz (121.927 kg)  BMI 43.41 kg/m2  SpO2 98%  BP/Weight 03/09/2016  03/09/2016 01/28/2016  Systolic BP 134 123 134  Diastolic BP 86 86 64  Wt. (Lbs) 269 268.8 267.7  BMI 43.44 43.41 44.55      Physical Exam  Constitutional: She is oriented to person, place, and time. She appears well-developed and well-nourished.  HENT:  Partially broken right upper premolar and left lower premolar; dental caries  Cardiovascular: Normal rate, normal heart sounds and intact distal pulses.   No murmur heard. Pulmonary/Chest: Effort normal. She has wheezes. She has no rales. She exhibits no tenderness.  Abdominal: Soft. Bowel sounds are normal. She exhibits no distension and no mass. There is no tenderness.  Musculoskeletal: She exhibits tenderness (Tenderness on range of motion of both knees with associated crepitus). She exhibits no edema.  Neurological: She is alert and oriented to person, place, and time.     Assessment & Plan:   1. OSA (obstructive sleep apnea) Controlled on CPAP machine  2. Uncontrolled persistent asthma Patient is obviously wheezing She is yet to pick up her prescription for prednisone and other medications sent in by pulmonary Encouraged compliance with medications and follow-up with Kathleen PollackLe Rodriguez pulmonologist  3. Primary osteoarthritis of both knees Status post cortisone shots in the past which were not helpful Encouraged that weight loss would be beneficial - naproxen (NAPROSYN) 500 MG tablet; Take 1 tablet (500 mg total) by mouth 2 (two) times daily with a meal.  Dispense: 60 tablet; Refill: 1 - traMADol (ULTRAM) 50 MG tablet; Take 1 tablet (50 mg total) by mouth every 8 (eight) hours as needed.  Dispense: 60 tablet; Refill: 1  4. Tooth ache She will need to see a dentist and has been encouraged to work on the Kathleen Rodriguez discount [employer] card to facilitate this referral. - amoxicillin (AMOXIL) 500 MG capsule; Take 1 capsule (500 mg total) by mouth 3 (three) times daily.  Dispense: 30 capsule; Refill: 0   Meds ordered this encounter    Medications  . naproxen (NAPROSYN) 500 MG tablet    Sig: Take 1 tablet (500 mg total) by mouth 2 (two) times daily with a meal.    Dispense:  60 tablet    Refill:  1  . traMADol (ULTRAM) 50 MG tablet    Sig: Take 1 tablet (50 mg total) by mouth every 8 (eight) hours as needed.    Dispense:  60 tablet    Refill:  1  . amoxicillin (AMOXIL) 500 MG capsule    Sig: Take 1 capsule (500 mg total) by mouth 3 (three) times daily.    Dispense:  30 capsule    Refill:  0    Follow-up: Return in about 2 weeks (around 03/23/2016) for complete physical exam.   Jaclyn ShaggyEnobong Amao MD

## 2016-03-26 ENCOUNTER — Encounter (HOSPITAL_COMMUNITY): Payer: Self-pay | Admitting: Emergency Medicine

## 2016-03-26 ENCOUNTER — Emergency Department (HOSPITAL_COMMUNITY): Payer: Self-pay

## 2016-03-26 ENCOUNTER — Emergency Department (HOSPITAL_COMMUNITY)
Admission: EM | Admit: 2016-03-26 | Discharge: 2016-03-26 | Disposition: A | Discharge status: Home / Self Care | Payer: Self-pay | Attending: Emergency Medicine | Admitting: Emergency Medicine

## 2016-03-26 DIAGNOSIS — Z87891 Personal history of nicotine dependence: Secondary | ICD-10-CM | POA: Insufficient documentation

## 2016-03-26 DIAGNOSIS — I110 Hypertensive heart disease with heart failure: Secondary | ICD-10-CM | POA: Insufficient documentation

## 2016-03-26 DIAGNOSIS — I509 Heart failure, unspecified: Secondary | ICD-10-CM | POA: Insufficient documentation

## 2016-03-26 DIAGNOSIS — J45901 Unspecified asthma with (acute) exacerbation: Secondary | ICD-10-CM | POA: Insufficient documentation

## 2016-03-26 DIAGNOSIS — Z91040 Latex allergy status: Secondary | ICD-10-CM | POA: Insufficient documentation

## 2016-03-26 LAB — URINE MICROSCOPIC-ADD ON: WBC UA: NONE SEEN WBC/hpf (ref 0–5)

## 2016-03-26 LAB — RAPID URINE DRUG SCREEN, HOSP PERFORMED
AMPHETAMINES: NOT DETECTED
BENZODIAZEPINES: NOT DETECTED
Barbiturates: NOT DETECTED
Cocaine: POSITIVE — AB
OPIATES: NOT DETECTED
TETRAHYDROCANNABINOL: NOT DETECTED

## 2016-03-26 LAB — COMPREHENSIVE METABOLIC PANEL
ALBUMIN: 4 g/dL (ref 3.5–5.0)
ALK PHOS: 57 U/L (ref 38–126)
ALT: 19 U/L (ref 14–54)
ANION GAP: 11 (ref 5–15)
AST: 23 U/L (ref 15–41)
BILIRUBIN TOTAL: 0.6 mg/dL (ref 0.3–1.2)
BUN: 14 mg/dL (ref 6–20)
CO2: 20 mmol/L — AB (ref 22–32)
Calcium: 8.6 mg/dL — ABNORMAL LOW (ref 8.9–10.3)
Chloride: 104 mmol/L (ref 101–111)
Creatinine, Ser: 0.93 mg/dL (ref 0.44–1.00)
GFR calc Af Amer: >60 mL/min (ref >60)
GFR calc non Af Amer: >60 mL/min (ref >60)
GLUCOSE: 109 mg/dL — AB (ref 65–99)
POTASSIUM: 4.4 mmol/L (ref 3.5–5.1)
SODIUM: 135 mmol/L (ref 135–145)
TOTAL PROTEIN: 7.6 g/dL (ref 6.5–8.1)

## 2016-03-26 LAB — CBC WITH DIFFERENTIAL/PLATELET
BASOS ABS: 0.1 10*3/uL (ref 0.0–0.1)
BASOS PCT: 0 %
EOS ABS: 0.4 10*3/uL (ref 0.0–0.7)
Eosinophils Relative: 3 %
HEMATOCRIT: 42.9 % (ref 36.0–46.0)
HEMOGLOBIN: 14.2 g/dL (ref 12.0–15.0)
Lymphocytes Relative: 15 %
Lymphs Abs: 2.2 10*3/uL (ref 0.7–4.0)
MCH: 28 pg (ref 26.0–34.0)
MCHC: 33.1 g/dL (ref 30.0–36.0)
MCV: 84.6 fL (ref 78.0–100.0)
Monocytes Absolute: 1.8 10*3/uL — ABNORMAL HIGH (ref 0.1–1.0)
Monocytes Relative: 13 %
NEUTROS ABS: 9.8 10*3/uL — AB (ref 1.7–7.7)
NEUTROS PCT: 69 %
Platelets: 316 10*3/uL (ref 150–400)
RBC: 5.07 MIL/uL (ref 3.87–5.11)
RDW: 17 % — ABNORMAL HIGH (ref 11.5–15.5)
WBC: 14.2 10*3/uL — AB (ref 4.0–10.5)

## 2016-03-26 LAB — URINALYSIS, ROUTINE W REFLEX MICROSCOPIC
GLUCOSE, UA: NEGATIVE mg/dL
HGB URINE DIPSTICK: NEGATIVE
KETONES UR: 40 mg/dL — AB
Leukocytes, UA: NEGATIVE
NITRITE: NEGATIVE
Protein, ur: 30 mg/dL — AB
SPECIFIC GRAVITY, URINE: 1.028 (ref 1.005–1.030)
pH: 5 (ref 5.0–8.0)

## 2016-03-26 LAB — I-STAT TROPONIN, ED: Troponin i, poc: 0 ng/mL (ref 0.00–0.08)

## 2016-03-26 LAB — ETHANOL: Alcohol, Ethyl (B): <5 mg/dL (ref <5)

## 2016-03-26 LAB — ACETAMINOPHEN LEVEL

## 2016-03-26 LAB — SALICYLATE LEVEL: Salicylate Lvl: <4 mg/dL (ref 2.8–30.0)

## 2016-03-26 MED ORDER — AZITHROMYCIN 250 MG PO TABS: ORAL_TABLET | ORAL | Status: DC

## 2016-03-26 MED ORDER — METHYLPREDNISOLONE SODIUM SUCC 125 MG IJ SOLR
125.0000 mg | Freq: Once | INTRAMUSCULAR | Status: AC
  Administered 2016-03-26: 125 mg via INTRAVENOUS
  Filled 2016-03-26: qty 2

## 2016-03-26 MED ORDER — ALBUTEROL SULFATE HFA 108 (90 BASE) MCG/ACT IN AERS: 2.0000 | INHALATION_SPRAY | Freq: Four times a day (QID) | RESPIRATORY_TRACT | Status: DC | PRN

## 2016-03-26 MED ORDER — SODIUM CHLORIDE 0.9 % IV BOLUS (SEPSIS)
1000.0000 mL | Freq: Once | INTRAVENOUS | Status: AC
  Administered 2016-03-26: 1000 mL via INTRAVENOUS

## 2016-03-26 MED ORDER — PREDNISONE 20 MG PO TABS: ORAL_TABLET | ORAL | Status: DC

## 2016-03-26 MED ORDER — ALBUTEROL SULFATE (2.5 MG/3ML) 0.083% IN NEBU
5.0000 mg | INHALATION_SOLUTION | Freq: Once | RESPIRATORY_TRACT | Status: AC
  Administered 2016-03-26: 5 mg via RESPIRATORY_TRACT
  Filled 2016-03-26: qty 6

## 2016-03-26 MED ORDER — FLUCONAZOLE 150 MG PO TABS
150.0000 mg | ORAL_TABLET | Freq: Once | ORAL | Status: DC
Start: 2016-03-26 — End: 2016-03-26
  Filled 2016-03-26: qty 1

## 2016-03-26 NOTE — ED Provider Notes (Signed)
CSN: [id number]     Arrival date & time 03/26/16  0342 History   By signing my name below, I, Suzan Slick. Elon Spanner, attest that this documentation has been prepared under the direction and in the presence of Richardean Canal, MD.  Electronically Signed: Suzan Slick. Elon Spanner, ED Scribe. 03/26/2016. 3:51 AM.   Chief Complaint  Patient presents with  . Shortness of Breath   The history is provided by the patient. No language interpreter was used.    HPI Comments: Yzabella Crunk brought in by EMS is a 50 y.o. female with a PMHx of asthma, HTN, and CHF who presents to the Emergency Department complaining of constant, worsening shortness of breath x 5 days. Patient took some of her left over steroids with minimal relief. She also was stressed out recently and drank some alcohol last several days. She called EMS while she was outside complaining of shortness of breath. She was admitted recently for asthma exacerbation. She was given 5 mg of Albuterol given en route to department. No recent fever or chills.   PCP: [name], MD    Past Medical History  Diagnosis Date  . Asthma   . Influenza B April 2014    Complicated by multi-organ failure  . 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 Required emergent intubation     asthma exacerbation in 2016  . Hypertension     "doctor took me off RX in 2016" (01/27/2016)  . CHF (congestive heart failure) (HCC) 2016    "when I went into a coma"  . Pneumonia 2016  . OSA on CPAP "since " 03/20/2013  . Arthritis     "knees, lower back; legs, ankles" (01/27/2016)   Past Surgical History  Procedure Laterality Date  . Cesarean section  2006  . Tubal ligation  2006  . Laceration repair Right ~ 1997    "tried to cut myself"   Family History  Problem Relation Age of Onset  . Hypertension Mother   . HIV/AIDS Father    Social History  Substance Use Topics  . Smoking status: Former Smoker -- 0.00 packs/day for 20 years    Types: Cigarettes    Quit date: 01/31/2013  .  Smokeless tobacco: Never Used  . 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  Respiratory: Positive for shortness of breath.   All other systems reviewed and are negative.     Allergies  Latex; Other; Tomato; and Wool alcohol  Home Medications   Prior to Admission medications   Medication Sig Start Date End Date Taking? Authorizing Provider  acetaminophen (TYLENOL) 500 MG tablet Take 1,000 mg by mouth every 6 (six) hours as needed for moderate pain.    Historical Provider, MD  albuterol (PROVENTIL) (2.5 MG/3ML) 0.083% nebulizer solution Take 3 mLs (2.5 mg total) by nebulization every 2 (two) hours as needed for wheezing or shortness of breath. 03/09/16   Tammy S Parrett, NP  albuterol (VENTOLIN HFA) 108 (90 Base) MCG/ACT inhaler Inhale 2 puffs into the lungs every 6 (six) hours as needed for wheezing. 03/09/16   Tammy S Parrett, NP  amoxicillin (AMOXIL) 500 MG capsule Take 1 capsule (500 mg total) by mouth 3 (three) times daily. 03/09/16   [name], MD  budesonide (PULMICORT) 0.25 MG/2ML nebulizer solution Take 2 mLs (0.25 mg total)  by nebulization 2 (two) times daily. 03/09/16   Tammy S Parrett, NP  famotidine (PEPCID) 20 MG tablet Take 1 tablet (20 mg total) by mouth at bedtime. 01/28/16   Shanker Levora DredgeM Ghimire, MD  fluticasone (FLONASE) 50 MCG/ACT nasal spray Place 2 sprays into the nose daily. 02/21/13   Bernadene PersonKathryn A Whiteheart, NP  guaiFENesin (MUCINEX) 600 MG 12 hr tablet Take 1 tablet (600 mg total) by mouth 2 (two) times daily. 01/28/16   Shanker Levora DredgeM Ghimire, MD  loratadine (CLARITIN) 10 MG tablet Take 1 tablet (10 mg total) by mouth daily. 01/28/16   Shanker Levora DredgeM Ghimire, MD  naproxen (NAPROSYN) 500 MG tablet Take 1 tablet (500 mg total) by mouth 2 (two) times daily with a meal. 03/09/16   Jaclyn ShaggyEnobong Amao, MD  predniSONE (DELTASONE) 10 MG tablet 4 tabs for 2 days, then  3 tabs for 2 days, 2 tabs for 2 days, then 1 tab for 2 days, then stop 03/09/16   Tammy S Parrett, NP  traMADol (ULTRAM) 50 MG tablet Take 1 tablet (50 mg total) by mouth every 8 (eight) hours as needed. 03/09/16   Jaclyn ShaggyEnobong Amao, MD   Triage Vitals: BP 159/63 mmHg  Pulse 114  Temp(Src) 97.9 F (36.6 C) (Oral)  Resp 19  SpO2 100%  LMP 02/24/2016 (Approximate)   Physical Exam  Constitutional: She is oriented to person, place, and time. She appears well-developed and well-nourished. No distress.  HENT:  Head: Normocephalic and atraumatic.  Eyes: EOM are normal.  Neck: Normal range of motion.  Cardiovascular: Normal rate, regular rhythm and normal heart sounds.   Pulmonary/Chest: Effort normal. She has wheezes.  Diffuse wheezing throughout   Abdominal: Soft. She exhibits no distension. There is no tenderness.  Musculoskeletal: Normal range of motion.  Neurological: She is alert and oriented to person, place, and time.  Skin: Skin is warm and dry.  Psychiatric: She has a normal mood and affect. Judgment normal.  Nursing note and vitals reviewed.   ED Course  Procedures (including critical care time)  DIAGNOSTIC STUDIES: Oxygen Saturation is 98% on RA, Normal by my interpretation.    COORDINATION OF CARE: 3:46 AM- Will order blood work and urinalysis. Will give fluids and breathing treatment. Discussed treatment plan with pt at bedside and pt agreed to plan.     Labs Review Labs Reviewed  CBC WITH DIFFERENTIAL/PLATELET - Abnormal; Notable for the following:    WBC 14.2 (*)    RDW 17.0 (*)    Neutro Abs 9.8 (*)    Monocytes Absolute 1.8 (*)    All other components within normal limits  COMPREHENSIVE METABOLIC PANEL - Abnormal; Notable for the following:    CO2 20 (*)    Glucose, Bld 109 (*)    Calcium 8.6 (*)    All other components within normal limits  ACETAMINOPHEN LEVEL - Abnormal; Notable for the following:    Acetaminophen (Tylenol), Serum <10 (*)    All other  components within normal limits  URINE RAPID DRUG SCREEN, HOSP PERFORMED - Abnormal; Notable for the following:    Cocaine POSITIVE (*)    All other components within normal limits  ETHANOL  SALICYLATE LEVEL  URINALYSIS, ROUTINE W REFLEX MICROSCOPIC (NOT AT [facility])  Rosezena SensorI-STAT TROPOININ, ED    Imaging Review Dg Chest 2 View  03/26/2016  CLINICAL DATA:  Initial evaluation for acute constant worsening shortness of breath. History of asthma, hypertension, CHF. EXAM: CHEST  2 VIEW COMPARISON:  Prior radiograph from 01/27/2016. FINDINGS: Mild  cardiomegaly is stable. Mediastinal silhouette within normal limits. Lungs are normally inflated. Mild chronic bronchitic changes are grossly similar, likely related history of asthma. No pulmonary edema or pleural effusion. No focal infiltrates. No pneumothorax. No acute osseus abnormality. Irregularity at the distal right clavicle is stable, may be related to remote trauma. IMPRESSION: 1. No active cardiopulmonary disease. 2. Grossly stable chronic bronchitic changes, suspected to be related to history of asthma. Electronically Signed   By: Rise Mu M.D.   On: 03/26/2016 05:00   I have personally reviewed and evaluated these images and lab results as part of my medical decision-making.   EKG Interpretation   Date/Time:  Saturday March 26 2016 04:04:01 EDT Ventricular Rate:  116 PR Interval:  96 QRS Duration: 101 QT Interval:  332 QTC Calculation: 461 R Axis:   86 Text Interpretation:  Sinus tachycardia Multiple ventricular premature  complexes Aberrant complex Probable inferolateral infarct, age indeterm  Repol abnrm, severe global ischemia (LM/MVD) rate faster than previous  Confirmed by Brogan Martis  MD, Shaneta Cervenka ([id number]) on 03/26/2016 4:08:03 AM      MDM   Final diagnoses:  None   Alyse Kathan is a 50 y.o. female here with wheezing, shortness of breath, cough, possible alcohol intoxication. Will get labs, CXR. Will give steroids, nebs and reassess.  Also will get tox level. She denies suicidal or homicidal ideations or hallucinations and doesn't need psych admission currently.    6:44 AM Tachycardia improved with IVF. Wheezing improved. Never hypoxic. Given solumedrol and 2 nebs. Labs showed WBC 14. CXR clear. She ambulated and O2 94-95%. She does get tachycardic with ambulation but felt better. I doubt PE and she had sinus tachycardia when she was admitted 2 months ago. Will dc home with albuterol, prednisone, zpack.   I personally performed the services described in this documentation, which was scribed in my presence. The recorded information has been reviewed and is accurate.   Richardean Canal, MD 03/26/16 [phone]

## 2016-03-26 NOTE — ED Notes (Signed)
Bed: [id number] Expected date:  Expected time:  Means of arrival:  Comments[facility] 50[facility]

## 2016-03-26 NOTE — ED Notes (Signed)
Brought in by EMS from home with c/o shortness of breath.  Per EMS, pt's friends found pt lying on the grass with increased respirations, apparently etoh intoxicated.  Pt has wheezing and c/o shortness of breath on EMS' arrival---- was given Albuterol 5 mg neb tx en route.  Pt admits to drinking alcohol tonight.

## 2016-03-26 NOTE — ED Notes (Signed)
Pt. Ambulated out into the hall and back to her room on 93-95% room air. Pt. Gait steady on her feet. MD.,Yao made aware.

## 2016-03-26 NOTE — Discharge Instructions (Signed)
Take prednisone as prescribed.   Stay hydrated.   Avoid drinking alcohol.   Use albuterol every 6 hrs as needed for wheezing and cough.   Take zpack as prescribed.   See your doctor.   Return to ER if you have worse trouble breathing, shortness of breath, wheezing, thoughts of harming yourself or others.

## 2016-03-26 NOTE — ED Notes (Signed)
Pt ambulated well in the hall---- pt's O2 sat remained 93-95% on room air while ambulating.

## 2016-03-26 NOTE — ED Notes (Signed)
Pt. Attempted to give urine but unsuccessful. 

## 2016-03-26 NOTE — ED Notes (Signed)
EKG given to EDP,Yao,MD., for review. 

## 2016-03-30 ENCOUNTER — Telehealth: Payer: Self-pay | Admitting: Family Medicine

## 2016-03-30 MED FILL — VENTOLIN HFA 90 MCG INHALER: 108 (90 BAS | 20 days supply | Qty: 18 | Fill #0

## 2016-03-30 MED FILL — predniSONE 20 MG TABS: 20 | 6 days supply | Qty: 12 | Fill #0

## 2016-03-30 MED FILL — AZITHROMYCIN 250 MG TABLET: 250 | 5 days supply | Qty: 6 | Fill #0

## 2016-03-30 NOTE — Telephone Encounter (Signed)
Patient stated that she's on antibiotics and the medicine has caused her a yeast infection and is needing a med for yeast. Please follow up.

## 2016-03-31 MED ORDER — FLUCONAZOLE 150 MG PO TABS
150.0000 mg | ORAL_TABLET | Freq: Once | ORAL | Status: DC
Start: 2016-03-31 — End: 2016-05-15

## 2016-03-31 MED FILL — FLUCONAZOLE 150 MG TABLET: 150 | 1 days supply | Qty: 1 | Fill #0

## 2016-03-31 NOTE — Telephone Encounter (Signed)
Diflucan sent to pharmacy.

## 2016-03-31 NOTE — Telephone Encounter (Signed)
Forwarding request to Dr. Venetia NightAmao

## 2016-04-01 ENCOUNTER — Telehealth: Payer: Self-pay | Admitting: Family Medicine

## 2016-04-01 NOTE — Telephone Encounter (Signed)
Patient would like to speak with nurse. Did not specify reason

## 2016-04-01 NOTE — Telephone Encounter (Signed)
Pt. Called requesting to be prescribed an Antibiotic for her tooth. Pt. Is in pain.  Please f/u

## 2016-04-01 NOTE — Telephone Encounter (Signed)
She received a 10 day course of amoxicillin from me at her visit on 03/09/16. It is too soon to be on another course of antibiotic; she needs to take an anti inflammatory and see a dentist.

## 2016-04-01 NOTE — Telephone Encounter (Signed)
Writer called patient who stated that at this time she has no money to have the tooth pulled, she was hoping for another course of the antibiotic to "hold her over" until she saves enough money.  She has been taking ibuprofen however at this time it is causing stomach issues. Patient states understanding that at this time Dr. Venetia NightAmao doesn't want to give her anymore antibiotics.

## 2016-05-04 ENCOUNTER — Encounter: Payer: Self-pay | Admitting: Family Medicine

## 2016-05-12 ENCOUNTER — Inpatient Hospital Stay (HOSPITAL_COMMUNITY)
Admission: EM | Admit: 2016-05-12 | Discharge: 2016-05-15 | DRG: 202 | Disposition: A | Discharge status: Home / Self Care | Payer: Medicaid Other | Attending: Family Medicine | Admitting: Family Medicine

## 2016-05-12 ENCOUNTER — Encounter (HOSPITAL_COMMUNITY): Payer: Self-pay | Admitting: Emergency Medicine

## 2016-05-12 ENCOUNTER — Emergency Department (HOSPITAL_COMMUNITY): Payer: Medicaid Other

## 2016-05-12 DIAGNOSIS — E669 Obesity, unspecified: Secondary | ICD-10-CM | POA: Diagnosis present

## 2016-05-12 DIAGNOSIS — J9601 Acute respiratory failure with hypoxia: Secondary | ICD-10-CM | POA: Diagnosis present

## 2016-05-12 DIAGNOSIS — G4733 Obstructive sleep apnea (adult) (pediatric): Secondary | ICD-10-CM | POA: Diagnosis present

## 2016-05-12 DIAGNOSIS — Z6841 Body Mass Index (BMI) 40.0 and over, adult: Secondary | ICD-10-CM

## 2016-05-12 DIAGNOSIS — F1410 Cocaine abuse, uncomplicated: Secondary | ICD-10-CM | POA: Diagnosis present

## 2016-05-12 DIAGNOSIS — J4521 Mild intermittent asthma with (acute) exacerbation: Secondary | ICD-10-CM

## 2016-05-12 DIAGNOSIS — K219 Gastro-esophageal reflux disease without esophagitis: Secondary | ICD-10-CM | POA: Diagnosis present

## 2016-05-12 DIAGNOSIS — Z720 Tobacco use: Secondary | ICD-10-CM

## 2016-05-12 DIAGNOSIS — Z8249 Family history of ischemic heart disease and other diseases of the circulatory system: Secondary | ICD-10-CM

## 2016-05-12 DIAGNOSIS — J45901 Unspecified asthma with (acute) exacerbation: Principal | ICD-10-CM | POA: Diagnosis present

## 2016-05-12 DIAGNOSIS — B359 Dermatophytosis, unspecified: Secondary | ICD-10-CM | POA: Diagnosis present

## 2016-05-12 HISTORY — DX: Cocaine abuse, uncomplicated: F14.10

## 2016-05-12 HISTORY — DX: Tobacco use: Z72.0

## 2016-05-12 HISTORY — DX: Gastro-esophageal reflux disease without esophagitis: K21.9

## 2016-05-12 LAB — CBC WITH DIFFERENTIAL/PLATELET
BASOS ABS: 0.1 10*3/uL (ref 0.0–0.1)
BASOS PCT: 1 %
EOS PCT: 6 %
Eosinophils Absolute: 0.5 10*3/uL (ref 0.0–0.7)
HCT: 40.3 % (ref 36.0–46.0)
Hemoglobin: 12.9 g/dL (ref 12.0–15.0)
LYMPHS PCT: 31 %
Lymphs Abs: 2.7 10*3/uL (ref 0.7–4.0)
MCH: 27.4 pg (ref 26.0–34.0)
MCHC: 32 g/dL (ref 30.0–36.0)
MCV: 85.7 fL (ref 78.0–100.0)
MONO ABS: 0.5 10*3/uL (ref 0.1–1.0)
Monocytes Relative: 5 %
Neutro Abs: 5.1 10*3/uL (ref 1.7–7.7)
Neutrophils Relative %: 57 %
PLATELETS: 289 10*3/uL (ref 150–400)
RBC: 4.7 MIL/uL (ref 3.87–5.11)
RDW: 16.4 % — AB (ref 11.5–15.5)
WBC: 8.9 10*3/uL (ref 4.0–10.5)

## 2016-05-12 LAB — I-STAT CHEM 8, ED
BUN: 14 mg/dL (ref 6–20)
CHLORIDE: 106 mmol/L (ref 101–111)
Calcium, Ion: 1.11 mmol/L — ABNORMAL LOW (ref 1.13–1.30)
Creatinine, Ser: 0.8 mg/dL (ref 0.44–1.00)
Glucose, Bld: 111 mg/dL — ABNORMAL HIGH (ref 65–99)
HEMATOCRIT: 43 % (ref 36.0–46.0)
HEMOGLOBIN: 14.6 g/dL (ref 12.0–15.0)
POTASSIUM: 3.6 mmol/L (ref 3.5–5.1)
SODIUM: 140 mmol/L (ref 135–145)
TCO2: 25 mmol/L (ref 0–100)

## 2016-05-12 MED ORDER — NICOTINE 21 MG/24HR TD PT24
21.0000 mg | MEDICATED_PATCH | Freq: Every day | TRANSDERMAL | Status: DC
  Administered 2016-05-13 – 2016-05-15 (×3): 21 mg via TRANSDERMAL
  Filled 2016-05-12 (×4): qty 1

## 2016-05-12 MED ORDER — LEVALBUTEROL HCL 1.25 MG/0.5ML IN NEBU
1.2500 mg | INHALATION_SOLUTION | RESPIRATORY_TRACT | Status: DC
  Administered 2016-05-12 – 2016-05-15 (×15): 1.25 mg via RESPIRATORY_TRACT
  Filled 2016-05-12 (×18): qty 0.5

## 2016-05-12 MED ORDER — LEVALBUTEROL HCL 1.25 MG/0.5ML IN NEBU: 1.2500 mg | INHALATION_SOLUTION | RESPIRATORY_TRACT | Status: DC

## 2016-05-12 MED ORDER — AZITHROMYCIN 250 MG PO TABS
500.0000 mg | ORAL_TABLET | Freq: Every day | ORAL | Status: AC
  Administered 2016-05-13: 500 mg via ORAL
  Filled 2016-05-12 (×2): qty 2

## 2016-05-12 MED ORDER — LEVALBUTEROL HCL 1.25 MG/0.5ML IN NEBU: 1.2500 mg | INHALATION_SOLUTION | Freq: Four times a day (QID) | RESPIRATORY_TRACT | Status: DC

## 2016-05-12 MED ORDER — IPRATROPIUM BROMIDE 0.02 % IN SOLN: 0.5000 mg | RESPIRATORY_TRACT | Status: DC

## 2016-05-12 MED ORDER — ACETAMINOPHEN 325 MG PO TABS
650.0000 mg | ORAL_TABLET | Freq: Four times a day (QID) | ORAL | Status: DC | PRN
  Administered 2016-05-13: 650 mg via ORAL
  Filled 2016-05-12: qty 2

## 2016-05-12 MED ORDER — IPRATROPIUM BROMIDE 0.02 % IN SOLN
0.5000 mg | RESPIRATORY_TRACT | Status: DC
  Administered 2016-05-12 – 2016-05-15 (×15): 0.5 mg via RESPIRATORY_TRACT
  Filled 2016-05-12 (×15): qty 2.5

## 2016-05-12 MED ORDER — ENOXAPARIN SODIUM 40 MG/0.4ML ~~LOC~~ SOLN
40.0000 mg | SUBCUTANEOUS | Status: DC
  Administered 2016-05-13: 40 mg via SUBCUTANEOUS
  Filled 2016-05-12 (×2): qty 0.4

## 2016-05-12 MED ORDER — METHYLPREDNISOLONE SODIUM SUCC 125 MG IJ SOLR
60.0000 mg | Freq: Three times a day (TID) | INTRAMUSCULAR | Status: DC
  Administered 2016-05-13 – 2016-05-15 (×8): 60 mg via INTRAVENOUS
  Filled 2016-05-12 (×8): qty 2

## 2016-05-12 MED ORDER — FLUTICASONE PROPIONATE 50 MCG/ACT NA SUSP
2.0000 | Freq: Every day | NASAL | Status: DC
Start: 2016-05-13 — End: 2016-05-15
  Administered 2016-05-13 – 2016-05-15 (×3): 2 via NASAL
  Filled 2016-05-12: qty 16

## 2016-05-12 MED ORDER — ALBUTEROL (5 MG/ML) CONTINUOUS INHALATION SOLN
10.0000 mg/h | INHALATION_SOLUTION | Freq: Once | RESPIRATORY_TRACT | Status: AC
  Administered 2016-05-12: 10 mg/h via RESPIRATORY_TRACT
  Filled 2016-05-12: qty 20

## 2016-05-12 MED ORDER — FLUCONAZOLE 100 MG PO TABS
150.0000 mg | ORAL_TABLET | Freq: Once | ORAL | Status: AC
  Administered 2016-05-13: 150 mg via ORAL
  Filled 2016-05-12: qty 2

## 2016-05-12 MED ORDER — AZITHROMYCIN 250 MG PO TABS
250.0000 mg | ORAL_TABLET | Freq: Every day | ORAL | Status: DC
  Administered 2016-05-13 – 2016-05-15 (×3): 250 mg via ORAL
  Filled 2016-05-12 (×3): qty 1

## 2016-05-12 MED ORDER — FAMOTIDINE 20 MG PO TABS
20.0000 mg | ORAL_TABLET | Freq: Every day | ORAL | Status: DC
  Administered 2016-05-13 – 2016-05-14 (×3): 20 mg via ORAL
  Filled 2016-05-12 (×3): qty 1

## 2016-05-12 MED ORDER — GUAIFENESIN ER 600 MG PO TB12
600.0000 mg | ORAL_TABLET | Freq: Two times a day (BID) | ORAL | Status: DC
  Administered 2016-05-13 – 2016-05-15 (×6): 600 mg via ORAL
  Filled 2016-05-12 (×6): qty 1

## 2016-05-12 NOTE — ED Provider Notes (Signed)
Pt has history of Asthma.  She has been intubated in the past.  Pt started having an attack around 8 am today.  She has tried breathing treatments without relief.  EMS gave her albuterol, ativan, mag sulfate and 125 mg solumedrol.  Pt continues to wheeze.  She has tightness in her chest. Physical Exam  BP 130/86 mmHg  Pulse 98  Temp(Src) 97.9 F (36.6 C) (Oral)  Resp 15  Ht 5\' 6"  (1.676 m)  Wt 122.471 kg  BMI 43.60 kg/m2  SpO2 90%  LMP 03/28/2016  Physical Exam  Constitutional: She appears distressed.  HENT:  Head: Normocephalic and atraumatic.  Right Ear: External ear normal.  Left Ear: External ear normal.  Eyes: Conjunctivae are normal. Right eye exhibits no discharge. Left eye exhibits no discharge. No scleral icterus.  Neck: Neck supple. No tracheal deviation present.  Cardiovascular: Normal rate.   Pulmonary/Chest: Accessory muscle usage present. No stridor. No respiratory distress. She has wheezes.  Musculoskeletal: She exhibits no edema.  Neurological: She is alert. Cranial nerve deficit: no gross deficits.  Skin: Skin is warm and dry. No rash noted.  Psychiatric: She has a normal mood and affect.  Nursing note and vitals reviewed.   ED Course  Procedures  EKG Interpretation  Date/Time:  Thursday May 12 2016 18:22:37 EDT Ventricular Rate:  99 PR Interval:    QRS Duration: 88 QT Interval:  353 QTC Calculation: 453 R Axis:   78 Text Interpretation:  Sinus rhythm Probable left atrial enlargement Baseline wander in lead(s) II III aVF No significant change since last tracing Confirmed by Terrius Gentile  MD-J, Lynnet Hefley ([id number]) on 05/12/2016 6:27:48 PM       MDM Pt presents with a recurrent asthma attack.  She has a history of severe attacks and in the past has required intubation.  She is alert, does not appear lethargic at this time.  She has received several treatments already. Will start on bipap, check an ABG      Linwood DibblesJon Utah Delauder, MD 05/12/16 [phone]

## 2016-05-12 NOTE — ED Notes (Signed)
Per EMS, patient is from home.  Patient is complaining of asthma attack.  Started around 8:00am today.  She gave herself 5 nebulizer treatments of Albuterol but no relief.  EMS administered 10 mg of Albuterol, 1 mg of Atrivan, 2 g of Mag Sulfate, and 125 mg of Sol-U-Medrol.  With some improvement.  BP:  131/68 P:105 regular R:22  Regular  97% on neb treatment and 93% on room air

## 2016-05-12 NOTE — ED Notes (Signed)
Called floor ok to move pt in 15 mins

## 2016-05-12 NOTE — Progress Notes (Signed)
Pt refusing ABG at this time.  Admitting MD notified, RT to monitor and assess as needed.

## 2016-05-12 NOTE — Progress Notes (Signed)
Pt refused ABG MD aware. Order was D/C.

## 2016-05-12 NOTE — Progress Notes (Signed)
Rt placed pt on BIPAP per MD order in the ED WA24.

## 2016-05-12 NOTE — ED Provider Notes (Signed)
CSN: [id number]     Arrival date & time 05/12/16  1725 History   First MD Initiated Contact with Patient 05/12/16 1744     Chief Complaint  Patient presents with  . Asthma     (Consider location/radiation/quality/duration/timing/severity/associated sxs/prior Treatment) HPI   50 year old female with history of asthma, CHF, obstructive sleep apnea on CPAP presenting with SOB.  Patient reports the past 2 days she has had increased shortness of breath and wheezing. Symptom is persistent, report having chest tightness and pleuritic chest pain from her asthma. She she felt it is related to air and the heat. She has been using her nebulizer 5 times today with minimal improvement. When EMS arrived, patient received 10 mg of albuterol, 1 mg of Ativan, 2 g of mag sulfate, as well as 125 mg of Solu-Medrol. Patient reports some mild improvement but continued to endorse shortness of breath. She denies any recent fever, URI symptoms, abdominal pain, back pain. She does have prior history of emergent intubation secondary to her asthma exacerbation. No recent hospitalization or recent sickness.  Past Medical History  Diagnosis Date  . Asthma   . Influenza B April 2014    Complicated by multi-organ failure  . 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 Required emergent intubation     asthma exacerbation in 2016  . Hypertension     "doctor took me off RX in 2016" (01/27/2016)  . CHF (congestive heart failure) (HCC) 2016    "when I went into a coma"  . Pneumonia 2016  . OSA on CPAP "since " 03/20/2013  . Arthritis     "knees, lower back; legs, ankles" (01/27/2016)   Past Surgical History  Procedure Laterality Date  . Cesarean section  2006  . Tubal ligation  2006  . Laceration repair Right ~ 1997    "tried to cut myself"   Family History  Problem Relation Age of Onset  . Hypertension Mother   . HIV/AIDS Father    Social History  Substance Use Topics  . Smoking status: Former Smoker -- 0.00  packs/day for 20 years    Types: Cigarettes    Quit date: 01/31/2013  . Smokeless tobacco: Never Used  . Alcohol Use: Yes     Comment: 01/26/2006 "clean from drinking since 2007"   OB History    No data available     Review of Systems  All other systems reviewed and are negative.     Allergies  Latex; Other; Tomato; and Wool alcohol  Home Medications   Prior to Admission medications   Medication Sig Start Date End Date Taking? Authorizing Provider  acetaminophen (TYLENOL) 500 MG tablet Take 1,000 mg by mouth every 6 (six) hours as needed for moderate pain.    Historical Provider, MD  albuterol (PROVENTIL) (2.5 MG/3ML) 0.083% nebulizer solution Take 3 mLs (2.5 mg total) by nebulization every 2 (two) hours as needed for wheezing or shortness of breath. 03/09/16   Tammy S Parrett, NP  albuterol (VENTOLIN HFA) 108 (90 Base) MCG/ACT inhaler Inhale 2 puffs into the lungs every 6 (six) hours as needed for wheezing. 03/26/16   Charlynne Panderavid Hsienta Yao, MD  amoxicillin (AMOXIL) 500 MG capsule Take 1 capsule (500 mg total) by mouth 3 (three) times daily. 03/09/16   Jaclyn ShaggyEnobong Amao, MD  azithromycin (ZITHROMAX Z-PAK) 250 MG tablet 2 po day one, then 1 daily x 4 days 03/26/16   Charlynne Panderavid Hsienta Yao, MD  budesonide (PULMICORT) 0.25 MG/2ML nebulizer solution Take 2 mLs (0.25  mg total) by nebulization 2 (two) times daily. 03/09/16   Tammy S Parrett, NP  famotidine (PEPCID) 20 MG tablet Take 1 tablet (20 mg total) by mouth at bedtime. 01/28/16   Shanker Levora Dredge, MD  fluconazole (DIFLUCAN) 150 MG tablet Take 1 tablet (150 mg total) by mouth once. 03/31/16   [name], MD  fluticasone (FLONASE) 50 MCG/ACT nasal spray Place 2 sprays into the nose daily. 02/21/13   Bernadene Person, NP  guaiFENesin (MUCINEX) 600 MG 12 hr tablet Take 1 tablet (600 mg total) by mouth 2 (two) times daily. 01/28/16   Shanker Levora Dredge, MD  loratadine (CLARITIN) 10 MG tablet Take 1 tablet (10 mg total) by mouth daily. 01/28/16   Shanker Levora Dredge, MD  naproxen (NAPROSYN) 500 MG tablet Take 1 tablet (500 mg total) by mouth 2 (two) times daily with a meal. 03/09/16   [name], MD  predniSONE (DELTASONE) 20 MG tablet Take 60 mg daily x 2 days, 40 mg daily x 2 days then 20 mg daily x 2 days 03/26/16   Charlynne Pander, MD  traMADol (ULTRAM) 50 MG tablet Take 1 tablet (50 mg total) by mouth every 8 (eight) hours as needed. 03/09/16   [name], MD   There were no vitals taken for this visit. Physical Exam  Constitutional: She is oriented to person, place, and time. She appears well-developed and well-nourished. No distress.  Obese of the American female sitting upright in moderate respiratory discomfort.  HENT:  Head: Atraumatic.  Mouth/Throat: Oropharynx is clear and moist.  Eyes: Conjunctivae are normal.  Neck: Neck supple. No JVD present. No tracheal deviation present.  Cardiovascular:  Mild tachycardia without murmurs rubs or gallops  Pulmonary/Chest:  Patient is tachypneic, with both inspiratory and expiratory wheezes throughout and decreased breath sounds. Accessory muscle use.  Musculoskeletal: She exhibits no edema.  Neurological: She is alert and oriented to person, place, and time.  Skin: No rash noted.  Psychiatric: She has a normal mood and affect.  Nursing note and vitals reviewed.   ED Course  Procedures (including critical care time) Labs Review Labs Reviewed  CBC WITH DIFFERENTIAL/PLATELET - Abnormal; Notable for the following:    RDW 16.4 (*)    All other components within normal limits  I-STAT CHEM 8, ED - Abnormal; Notable for the following:    Glucose, Bld 111 (*)    Calcium, Ion 1.11 (*)    All other components within normal limits    Imaging Review Dg Chest Portable 1 View  05/12/2016  CLINICAL DATA:  50 y/o F; asthma attack. History of congestive heart failure and hypertension. EXAM: PORTABLE CHEST 1 VIEW COMPARISON:  Chest radiographs dated 03/26/2016. FINDINGS: Stable cardiomediastinal  silhouette given differences in technique. No consolidation, pneumothorax, or pleural effusion. No acute osseous abnormality IMPRESSION: No active disease. Electronically Signed   By: Mitzi Hansen M.D.   On: 05/12/2016 18:59   I have personally reviewed and evaluated these images and lab results as part of my medical decision-making.   EKG Interpretation   Date/Time:  Thursday May 12 2016 18:22:37 EDT Ventricular Rate:  99 PR Interval:    QRS Duration: 88 QT Interval:  353 QTC Calculation: 453 R Axis:   78 Text Interpretation:  Sinus rhythm Probable left atrial enlargement  Baseline wander in lead(s) II III aVF No significant change since last  tracing Confirmed by KNAPP  MD-J, JON ([id number]) on 05/12/2016 6:27:48 PM      MDM  Final diagnoses:  Acute asthma exacerbation, unspecified asthma severity    BP 99/64 mmHg  Pulse 110  Temp(Src) 97.9 F (36.6 C) (Oral)  Resp 23  Ht  (1.676 m)  Wt 122.471 kg  BMI 43.60 kg/m2  SpO2 93%  LMP 03/28/2016   6:13 PM Patient with significant history of COPD and asthma and prior emergent intubation for asthma exacerbation here with persistent shortness of breath and wheezing consistence with an asthma attack. Despite receiving moderate amount of medication as treatment his symptoms persist. She is in moderate respiratory discomfort, will provide additional albuterol but anticipate admission for further management.  Care discussed with Dr. Lynelle Doctor.   8:47 PM Pt shows improvement while on BiPAP.  She request to come off.  Resp tech switched her to [place] 4L.   Pt still report having chest tightness and still having some respiratory distress.  She refused ABG.    9:31 PM Appreciate consultation from [facility] Dr. Clyde Lundborg who agrees to see pt in the ER.  Pt to be admitted to step down under his care, observation status.   Fayrene Helper, PA-C 05/12/16 2132  Linwood Dibbles, MD 05/12/16 2221

## 2016-05-12 NOTE — Progress Notes (Signed)
Pt wanting to come off BIPAP at this time.  RT placed pt on 4 LPM [place], Pt tolerating well at this time.  PA made aware, RT to monitor and assess at this time.

## 2016-05-12 NOTE — H&P (Addendum)
History and Physical    Kathleen Rodriguez ZOX:[id number] DOB: 03-Oct-1966 DOA: 05/12/2016  Referring MD/NP/PA:   PCP: [name], MD   Patient coming from:  The patient is coming from home.  At baseline, pt is independent for most of ADL.  Chief Complaint: Shortness breath, cough and wheezing  HPI: Kathleen Rodriguez is a 50 y.o. female with medical history significant of asthma, GERD, tobacco abuse, cocaine abuse, history of intubation, OSA on CPAP, arthritis, who presents with SOB, cough and wheezing.  Patient reports that she started having shortness, cough and wheezing since this morning, which has been progressively getting worse. She can speak in full sentences now. She does not have sputum production. She has chest tightness and left-sided chest pain, which is induced by coughing per patient. Patient does not have fever or chills. No sore throat. She has some mild runny nose. Patient denies nausea, vomiting, diarrhea, abdominal pain. She denies symptoms of UTI, but reports vaginal itching.  ED Course: pt was found to have O2 Saturday to 90% on room air, tachycardia, tachypnea, WBC 8.9, normal temperature, electrolytes and renal function, negative chest x-ray. Patient is started with BiPAP in ED with some improvement. Patient is placed on stepdown bed for observation.  Review of Systems:   General: no fevers, chills, no changes in body weight, has fatigue HEENT: no blurry vision, hearing changes or sore throat Pulm: has dyspnea, coughing, wheezing CV: has chest pain, no palpitations Abd: no nausea, vomiting, abdominal pain, diarrhea, constipation GU: no dysuria, burning on urination, increased urinary frequency, hematuria. Has vaginal itches. Ext: no leg edema Neuro: no unilateral weakness, numbness, or tingling, no vision change or hearing loss Skin: no rash MSK: No muscle spasm, no deformity, no limitation of range of movement in spin Heme: No easy bruising.  Travel history: No recent  long distant travel.  Allergy:  Allergies  Allergen Reactions  . Latex Itching and Rash  . Other Hives and Itching    Ketchup  . Tomato Hives and Itching  . Wool Alcohol [Lanolin] Itching    Past Medical History  Diagnosis Date  . Asthma   . Influenza B April 2014    Complicated by multi-organ failure  . 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 Required emergent intubation     asthma exacerbation in 2016  . Hypertension     "doctor took me off RX in 2016" (01/27/2016)  . CHF (congestive heart failure) (HCC) 2016    "when I went into a coma"  . Pneumonia 2016  . OSA on CPAP "since " 03/20/2013  . Arthritis     "knees, lower back; legs, ankles" (01/27/2016)  . Tobacco abuse   . GERD (gastroesophageal reflux disease)   . Cocaine abuse     Past Surgical History  Procedure Laterality Date  . Cesarean section  2006  . Tubal ligation  2006  . Laceration repair Right ~ 1997    "tried to cut myself"    Social History:  reports that she quit smoking about 3 years ago. Her smoking use included Cigarettes. She smoked 0.00 packs per day for 20 years. She has never used smokeless tobacco. She reports that she drinks alcohol. She reports that she uses illicit drugs.  Family History:  Family History  Problem Relation Age of Onset  . Hypertension Mother   . HIV/AIDS Father      Prior to Admission medications   Medication Sig Start Date End Date Taking? Authorizing Provider  acetaminophen (TYLENOL)  500 MG tablet Take 1,000 mg by mouth every 6 (six) hours as needed for moderate pain.   Yes Historical Provider, MD  albuterol (PROVENTIL) (2.5 MG/3ML) 0.083% nebulizer solution Take 3 mLs (2.5 mg total) by nebulization every 2 (two) hours as needed for wheezing or shortness of breath. 03/09/16  Yes Tammy S Parrett, NP  albuterol (VENTOLIN HFA) 108 (90 Base) MCG/ACT inhaler Inhale 2 puffs into the lungs every 6 (six) hours as needed for wheezing. 03/26/16  Yes Charlynne Pander, MD  budesonide  (PULMICORT) 0.25 MG/2ML nebulizer solution Take 2 mLs (0.25 mg total) by nebulization 2 (two) times daily. 03/09/16  Yes Tammy S Parrett, NP  fluticasone (FLONASE) 50 MCG/ACT nasal spray Place 2 sprays into the nose daily. 02/21/13  Yes Bernadene Person, NP  amoxicillin (AMOXIL) 500 MG capsule Take 1 capsule (500 mg total) by mouth 3 (three) times daily. Patient not taking: Reported on 05/12/2016 03/09/16   [name], MD  azithromycin (ZITHROMAX Z-PAK) 250 MG tablet 2 po day one, then 1 daily x 4 days Patient not taking: Reported on 05/12/2016 03/26/16   Charlynne Pander, MD  famotidine (PEPCID) 20 MG tablet Take 1 tablet (20 mg total) by mouth at bedtime. Patient not taking: Reported on 05/12/2016 01/28/16   Maretta Bees, MD  fluconazole (DIFLUCAN) 150 MG tablet Take 1 tablet (150 mg total) by mouth once. Patient not taking: Reported on 05/12/2016 03/31/16   [name], MD  guaiFENesin (MUCINEX) 600 MG 12 hr tablet Take 1 tablet (600 mg total) by mouth 2 (two) times daily. Patient not taking: Reported on 05/12/2016 01/28/16   Maretta Bees, MD  loratadine (CLARITIN) 10 MG tablet Take 1 tablet (10 mg total) by mouth daily. Patient not taking: Reported on 05/12/2016 01/28/16   Maretta Bees, MD  naproxen (NAPROSYN) 500 MG tablet Take 1 tablet (500 mg total) by mouth 2 (two) times daily with a meal. Patient not taking: Reported on 05/12/2016 03/09/16   [name], MD  predniSONE (DELTASONE) 20 MG tablet Take 60 mg daily x 2 days, 40 mg daily x 2 days then 20 mg daily x 2 days Patient not taking: Reported on 05/12/2016 03/26/16   Charlynne Pander, MD  traMADol (ULTRAM) 50 MG tablet Take 1 tablet (50 mg total) by mouth every 8 (eight) hours as needed. Patient not taking: Reported on 05/12/2016 03/09/16   [name], MD    Physical Exam: Filed Vitals:   05/12/16 1934 05/12/16 2014 05/12/16 2051 05/12/16 2137  BP:  99/64 99/64 135/62  Pulse: 103 100 110 110  Temp:      TempSrc:      Resp: 28  20 23 20   Height:      Weight:      SpO2: 100% 100% 93% 90%   General: Not in acute distress HEENT:       Eyes: PERRL, EOMI, no scleral icterus.       ENT: No discharge from the ears and nose, no pharynx injection, no tonsillar enlargement.        Neck: No JVD, no bruit, no mass felt. Heme: No neck lymph node enlargement. Cardiac: S1/S2, RRR, No murmurs, No gallops or rubs. Pulm: decreased air movement bilaterally and bilateral wheezing. No rales or rubs. Abd: Soft, nondistended, nontender, no rebound pain, no organomegaly, BS present. GU: No hematuria Ext: No pitting leg edema bilaterally. 2+DP/PT pulse bilaterally. Musculoskeletal: No joint deformities, No joint redness or warmth, no limitation of  ROM in spin. Skin: No rashes.  Neuro: Alert, oriented X3, cranial nerves II-XII grossly intact, moves all extremities normally. Psych: Patient is not psychotic, no suicidal or hemocidal ideation.  Labs on Admission: I have personally reviewed following labs and imaging studies  CBC:  Recent Labs Lab 05/12/16 1825 05/12/16 1836  WBC 8.9  --   NEUTROABS 5.1  --   HGB 12.9 14.6  HCT 40.3 43.0  MCV 85.7  --   PLT 289  --    Basic Metabolic Panel:  Recent Labs Lab 05/12/16 1836  NA 140  K 3.6  CL 106  GLUCOSE 111*  BUN 14  CREATININE 0.80   GFR: Estimated Creatinine Clearance: 112.4 mL/min (by C-G formula based on Cr of 0.8). Liver Function Tests: No results for input(s): AST, ALT, ALKPHOS, BILITOT, PROT, ALBUMIN in the last 168 hours. No results for input(s): LIPASE, AMYLASE in the last 168 hours. No results for input(s): AMMONIA in the last 168 hours. Coagulation Profile: No results for input(s): INR, PROTIME in the last 168 hours. Cardiac Enzymes: No results for input(s): CKTOTAL, CKMB, CKMBINDEX, TROPONINI in the last 168 hours. BNP (last 3 results) No results for input(s): PROBNP in the last 8760 hours. HbA1C: No results for input(s): HGBA1C in the last 72  hours. CBG: No results for input(s): GLUCAP in the last 168 hours. Lipid Profile: No results for input(s): CHOL, HDL, LDLCALC, TRIG, CHOLHDL, LDLDIRECT in the last 72 hours. Thyroid Function Tests: No results for input(s): TSH, T4TOTAL, FREET4, T3FREE, THYROIDAB in the last 72 hours. Anemia Panel: No results for input(s): VITAMINB12, FOLATE, FERRITIN, TIBC, IRON, RETICCTPCT in the last 72 hours. Urine analysis:    Component Value Date/Time   COLORURINE AMBER* 03/26/2016 0616   APPEARANCEUR CLOUDY* 03/26/2016 0616   LABSPEC 1.028 03/26/2016 0616   PHURINE 5.0 03/26/2016 0616   GLUCOSEU NEGATIVE 03/26/2016 0616   HGBUR NEGATIVE 03/26/2016 0616   BILIRUBINUR SMALL* 03/26/2016 0616   KETONESUR 40* 03/26/2016 0616   PROTEINUR 30* 03/26/2016 0616   UROBILINOGEN 1.0 03/22/2014 0805   NITRITE NEGATIVE 03/26/2016 0616   LEUKOCYTESUR NEGATIVE 03/26/2016 0616   Sepsis Labs: (procalcitonin:4,lacticidven:4) )No results found for this or any previous visit (from the past 240 hour(s)).   Radiological Exams on Admission: Dg Chest Portable 1 View  05/12/2016  CLINICAL DATA:  50 y/o F; asthma attack. History of congestive heart failure and hypertension. EXAM: PORTABLE CHEST 1 VIEW COMPARISON:  Chest radiographs dated 03/26/2016. FINDINGS: Stable cardiomediastinal silhouette given differences in technique. No consolidation, pneumothorax, or pleural effusion. No acute osseous abnormality IMPRESSION: No active disease. Electronically Signed   By: Mitzi Hansen M.D.   On: 05/12/2016 18:59     EKG: Independently reviewed. Sinus rhythm, QTC 453, no ischemic change. Assessment/Plan Principal Problem:   Acute asthma exacerbation Active Problems:   OSA (obstructive sleep apnea)   Asthma exacerbation   GERD (gastroesophageal reflux disease)   Tobacco abuse   Cocaine abuse   Acute respiratory failure with hypoxia (HCC)  Acute respiratory failure with hypoxia due to acute asthma  exacerbation: Patient's cough, shortness of breath and wheezing are consistent with acute asthma exacerbation. No infiltration on chest x-ray. She had hx intubation due to asthma exacerbation, is high risk patient for intubation.  -will place on SDU for obs (due to need of BiPAP) -Z pak x 5 days -Nebulizers: scheduled Atrovent and prn Xopenex -Solu-Medrol  IV q8h  -Mucinex for cough  -Urine S. pneumococcal antigen -Follow up blood culture  x2 if developes fevere, sputum culture, respiratory virus panel -get ABG  OSA: -CPAP  GERD: -Pepcid  Tobacco abuse and Cocaine abuse: -did counseling about the importance of quitting substance -Nicotine patch -UDS  Vaginal Itches: had hx of vaginal candidiasis -will give 1 dose of Diflucan now -Check GC/Chlamydia problem - check HIV ab  DVT ppx: SQ Lovenox Code Status: Full code Family Communication: None at bed side.  Disposition Plan:  Anticipate discharge back to previous home environment Consults called:   Admission status:  SDU/obs  Date of Service 05/12/2016    Lorretta HarpIU, Kiearra Oyervides [facility] Pager [phone]  If 7PM-7AM, please contact night-coverage [email or web address] Password [email or web address] 05/12/2016, 10:01 PM

## 2016-05-13 DIAGNOSIS — B359 Dermatophytosis, unspecified: Secondary | ICD-10-CM | POA: Diagnosis present

## 2016-05-13 DIAGNOSIS — J45901 Unspecified asthma with (acute) exacerbation: Secondary | ICD-10-CM | POA: Diagnosis not present

## 2016-05-13 DIAGNOSIS — Z6841 Body Mass Index (BMI) 40.0 and over, adult: Secondary | ICD-10-CM | POA: Diagnosis not present

## 2016-05-13 DIAGNOSIS — R0602 Shortness of breath: Secondary | ICD-10-CM | POA: Diagnosis present

## 2016-05-13 DIAGNOSIS — G4733 Obstructive sleep apnea (adult) (pediatric): Secondary | ICD-10-CM | POA: Diagnosis present

## 2016-05-13 DIAGNOSIS — J9601 Acute respiratory failure with hypoxia: Secondary | ICD-10-CM

## 2016-05-13 DIAGNOSIS — K219 Gastro-esophageal reflux disease without esophagitis: Secondary | ICD-10-CM | POA: Diagnosis present

## 2016-05-13 DIAGNOSIS — E669 Obesity, unspecified: Secondary | ICD-10-CM | POA: Diagnosis present

## 2016-05-13 DIAGNOSIS — Z8249 Family history of ischemic heart disease and other diseases of the circulatory system: Secondary | ICD-10-CM | POA: Diagnosis not present

## 2016-05-13 DIAGNOSIS — F1410 Cocaine abuse, uncomplicated: Secondary | ICD-10-CM | POA: Diagnosis present

## 2016-05-13 LAB — BRAIN NATRIURETIC PEPTIDE: B NATRIURETIC PEPTIDE 5: 19.3 pg/mL (ref 0.0–100.0)

## 2016-05-13 LAB — RESPIRATORY PANEL BY PCR
Adenovirus: NOT DETECTED
Bordetella pertussis: NOT DETECTED
CORONAVIRUS HKU1-RVPPCR: NOT DETECTED
CORONAVIRUS OC43-RVPPCR: NOT DETECTED
Chlamydophila pneumoniae: NOT DETECTED
Coronavirus 229E: NOT DETECTED
Coronavirus NL63: NOT DETECTED
INFLUENZA A H1 2009-RVPPR: NOT DETECTED
INFLUENZA A H3-RVPPCR: NOT DETECTED
INFLUENZA A-RVPPCR: NOT DETECTED
Influenza A H1: NOT DETECTED
Influenza B: NOT DETECTED
MYCOPLASMA PNEUMONIAE-RVPPCR: NOT DETECTED
Metapneumovirus: NOT DETECTED
PARAINFLUENZA VIRUS 1-RVPPCR: NOT DETECTED
PARAINFLUENZA VIRUS 2-RVPPCR: NOT DETECTED
PARAINFLUENZA VIRUS 3-RVPPCR: NOT DETECTED
Parainfluenza Virus 4: NOT DETECTED
RHINOVIRUS / ENTEROVIRUS - RVPPCR: NOT DETECTED
Respiratory Syncytial Virus: NOT DETECTED

## 2016-05-13 LAB — RAPID URINE DRUG SCREEN, HOSP PERFORMED
AMPHETAMINES: NOT DETECTED
Barbiturates: NOT DETECTED
Benzodiazepines: NOT DETECTED
COCAINE: NOT DETECTED
OPIATES: NOT DETECTED
TETRAHYDROCANNABINOL: NOT DETECTED

## 2016-05-13 LAB — HEPATIC FUNCTION PANEL
ALBUMIN: 3.9 g/dL (ref 3.5–5.0)
ALK PHOS: 56 U/L (ref 38–126)
ALT: 13 U/L — AB (ref 14–54)
AST: 20 U/L (ref 15–41)
Bilirubin, Direct: <0.1 mg/dL — ABNORMAL LOW (ref 0.1–0.5)
TOTAL PROTEIN: 7.4 g/dL (ref 6.5–8.1)
Total Bilirubin: 0.5 mg/dL (ref 0.3–1.2)

## 2016-05-13 LAB — URINALYSIS, ROUTINE W REFLEX MICROSCOPIC
BILIRUBIN URINE: NEGATIVE
Hgb urine dipstick: NEGATIVE
KETONES UR: NEGATIVE mg/dL
Leukocytes, UA: NEGATIVE
NITRITE: NEGATIVE
PH: 5 (ref 5.0–8.0)
Protein, ur: NEGATIVE mg/dL
SPECIFIC GRAVITY, URINE: 1.031 — AB (ref 1.005–1.030)

## 2016-05-13 LAB — STREP PNEUMONIAE URINARY ANTIGEN: STREP PNEUMO URINARY ANTIGEN: NEGATIVE

## 2016-05-13 LAB — URINE MICROSCOPIC-ADD ON: RBC / HPF: NONE SEEN RBC/hpf (ref 0–5)

## 2016-05-13 LAB — MAGNESIUM: Magnesium: 2 mg/dL (ref 1.7–2.4)

## 2016-05-13 LAB — MRSA PCR SCREENING: MRSA BY PCR: NEGATIVE

## 2016-05-13 MED ORDER — OXYCODONE HCL 5 MG PO TABS
5.0000 mg | ORAL_TABLET | ORAL | Status: DC | PRN
  Administered 2016-05-13 – 2016-05-14 (×3): 5 mg via ORAL
  Filled 2016-05-13 (×3): qty 1

## 2016-05-13 MED ORDER — ENOXAPARIN SODIUM 60 MG/0.6ML ~~LOC~~ SOLN
60.0000 mg | SUBCUTANEOUS | Status: DC
  Administered 2016-05-13 – 2016-05-14 (×2): 60 mg via SUBCUTANEOUS
  Filled 2016-05-13 (×3): qty 0.6

## 2016-05-13 MED ORDER — MORPHINE SULFATE (PF) 2 MG/ML IV SOLN
2.0000 mg | Freq: Once | INTRAVENOUS | Status: AC
  Administered 2016-05-13: 2 mg via INTRAVENOUS
  Filled 2016-05-13: qty 1

## 2016-05-13 MED ORDER — ACETAMINOPHEN 500 MG PO TABS
1000.0000 mg | ORAL_TABLET | Freq: Once | ORAL | Status: AC
  Administered 2016-05-13: 1000 mg via ORAL
  Filled 2016-05-13: qty 2

## 2016-05-13 NOTE — Progress Notes (Signed)
Pt complaining of severe abdominal and leg cramping. MD notified. Will continue to monitor.

## 2016-05-13 NOTE — Progress Notes (Signed)
PROGRESS NOTE    Kathleen AbideLouise Rodriguez  UXL:[id number] DOB: 1966-05-20 DOA: 05/12/2016 PCP: Jaclyn ShaggyEnobong, Amao, MD    Brief Narrative:  50 y.o. female with medical history significant of asthma, GERD, tobacco abuse, cocaine abuse, history of intubation, OSA on CPAP, arthritis, who presents with SOB, cough and wheezing.  Patient reports that she started having shortness, cough and wheezing since this morning, which has been progressively getting worse. She can speak in full sentences now  Assessment & Plan:   Principal Problem:   Acute asthma exacerbation - Patient is currently on Solu-Medrol 60 mg IV 3 times a day. We'll plan on continuing. Otherwise continue current medical regimen. - Given history and continued symptoms we'll monitor closely in stepdown unit  Active Problems:   OSA (obstructive sleep apnea) - Continue home C Pap settings    GERD (gastroesophageal reflux disease) - pepcid    Tobacco abuse - continue nicotine patch    Cocaine abuse - Will reinforce abstinence on d/c    Acute respiratory failure with hypoxia (HCC) - continue current regimen and supplemental oxygen  DVT prophylaxis: Lovenox Code Status: Full Family Communication: None at bedside Disposition Plan: Pending improvement in condition   Consultants:   None   Procedures:  None   Antimicrobials: Azithromycin   Subjective:   Objective: Filed Vitals:   05/13/16 0800 05/13/16 0801 05/13/16 0900 05/13/16 1000  BP: 158/85  164/82 142/92  Pulse: 88  91 109  Temp: 97.2 F (36.2 C)     TempSrc: Axillary     Resp: 19  19 24   Height:      Weight:      SpO2: 96% 98% 94% 97%   No intake or output data in the 24 hours ending 05/13/16 1123 Filed Weights   05/12/16 1754 05/12/16 2300  Weight: 122.471 kg (270 lb) 120.6 kg (265 lb 14 oz)    Examination:  General exam: Appears calm and comfortable  Respiratory system: diffuse exp wheezes BL, equal chest rise, poor inspiratory  effort Cardiovascular system: S1 & S2 heard, RRR. No JVD, murmurs, rubs, gallops or clicks.  Gastrointestinal system: Abdomen is nondistended, soft and nontender. No organomegaly or masses felt. Normal bowel sounds heard. Central nervous system: Alert and oriented. No focal neurological deficits. Extremities: Symmetric 5 x 5 power. Skin: No rashes, lesions or ulcers Psychiatry: Judgement and insight appear normal. Mood & affect appropriate.     Data Reviewed: I have personally reviewed following labs and imaging studies  CBC:  Recent Labs Lab 05/12/16 1825 05/12/16 1836  WBC 8.9  --   NEUTROABS 5.1  --   HGB 12.9 14.6  HCT 40.3 43.0  MCV 85.7  --   PLT 289  --    Basic Metabolic Panel:  Recent Labs Lab 05/12/16 1836 05/13/16 0146  NA 140  --   K 3.6  --   CL 106  --   GLUCOSE 111*  --   BUN 14  --   CREATININE 0.80  --   MG  --  2.0   GFR: Estimated Creatinine Clearance: 111.3 mL/min (by C-G formula based on Cr of 0.8). Liver Function Tests:  Recent Labs Lab 05/13/16 0146  AST 20  ALT 13*  ALKPHOS 56  BILITOT 0.5  PROT 7.4  ALBUMIN 3.9   No results for input(s): LIPASE, AMYLASE in the last 168 hours. No results for input(s): AMMONIA in the last 168 hours. Coagulation Profile: No results for input(s): INR, PROTIME in the last 168  hours. Cardiac Enzymes: No results for input(s): CKTOTAL, CKMB, CKMBINDEX, TROPONINI in the last 168 hours. BNP (last 3 results) No results for input(s): PROBNP in the last 8760 hours. HbA1C: No results for input(s): HGBA1C in the last 72 hours. CBG: No results for input(s): GLUCAP in the last 168 hours. Lipid Profile: No results for input(s): CHOL, HDL, LDLCALC, TRIG, CHOLHDL, LDLDIRECT in the last 72 hours. Thyroid Function Tests: No results for input(s): TSH, T4TOTAL, FREET4, T3FREE, THYROIDAB in the last 72 hours. Anemia Panel: No results for input(s): VITAMINB12, FOLATE, FERRITIN, TIBC, IRON, RETICCTPCT in the last 72  hours. Sepsis Labs: No results for input(s): PROCALCITON, LATICACIDVEN in the last 168 hours.  Recent Results (from the past 240 hour(s))  MRSA PCR Screening     Status: None   Collection Time: 05/12/16 11:49 PM  Result Value Ref Range Status   MRSA by PCR NEGATIVE NEGATIVE Final    Comment:        The GeneXpert MRSA Assay (FDA approved for NASAL specimens only), is one component of a comprehensive MRSA colonization surveillance program. It is not intended to diagnose MRSA infection nor to guide or monitor treatment for MRSA infections.          Radiology Studies: Dg Chest Portable 1 View  05/12/2016  CLINICAL DATA:  51 y/o F; asthma attack. History of congestive heart failure and hypertension. EXAM: PORTABLE CHEST 1 VIEW COMPARISON:  Chest radiographs dated 03/26/2016. FINDINGS: Stable cardiomediastinal silhouette given differences in technique. No consolidation, pneumothorax, or pleural effusion. No acute osseous abnormality IMPRESSION: No active disease. Electronically Signed   By: Mitzi Hansen M.D.   On: 05/12/2016 18:59        Scheduled Meds: . azithromycin  250 mg Oral Daily  . enoxaparin (LOVENOX) injection  60 mg Subcutaneous Q24H  . famotidine  20 mg Oral QHS  . fluticasone  2 spray Each Nare Daily  . guaiFENesin  600 mg Oral BID  . ipratropium  0.5 mg Nebulization Q4H  . levalbuterol  1.25 mg Nebulization Q4H  . methylPREDNISolone (SOLU-MEDROL) injection  60 mg Intravenous TID  . nicotine  21 mg Transdermal Daily   Continuous Infusions:   Time spent: > 35 minutes  Penny Pia, MD [facility] Pager [phone]  If 7PM-7AM, please contact night-coverage [email or web address] Password [facility] 05/13/2016, 11:23 AM

## 2016-05-13 NOTE — Progress Notes (Signed)
Pt noted for increased work of breathing. Pt feels that BiPAP is more effective than CPAP for tonight.

## 2016-05-14 DIAGNOSIS — J45901 Unspecified asthma with (acute) exacerbation: Principal | ICD-10-CM

## 2016-05-14 DIAGNOSIS — K219 Gastro-esophageal reflux disease without esophagitis: Secondary | ICD-10-CM

## 2016-05-14 LAB — MAGNESIUM: Magnesium: 1.9 mg/dL (ref 1.7–2.4)

## 2016-05-14 LAB — BASIC METABOLIC PANEL
ANION GAP: 7 (ref 5–15)
BUN: 15 mg/dL (ref 6–20)
CALCIUM: 8.8 mg/dL — AB (ref 8.9–10.3)
CHLORIDE: 105 mmol/L (ref 101–111)
CO2: 25 mmol/L (ref 22–32)
Creatinine, Ser: 0.8 mg/dL (ref 0.44–1.00)
GFR calc non Af Amer: >60 mL/min (ref >60)
Glucose, Bld: 121 mg/dL — ABNORMAL HIGH (ref 65–99)
Potassium: 4.2 mmol/L (ref 3.5–5.1)
Sodium: 137 mmol/L (ref 135–145)

## 2016-05-14 LAB — URINE CULTURE

## 2016-05-14 LAB — HIV ANTIBODY (ROUTINE TESTING W REFLEX): HIV Screen 4th Generation wRfx: NONREACTIVE

## 2016-05-14 NOTE — Progress Notes (Signed)
Patient from stepdown ICU. Alert and oriented x 4. Able to ambulate with standyby assist. Reoriented to room and environment. Will continue to monitor.

## 2016-05-14 NOTE — Progress Notes (Signed)
PROGRESS NOTE    Kathleen Rodriguez  ZOX:[id number] DOB: 03-31-1966 DOA: 05/12/2016 PCP: [name], MD    Brief Narrative:  50 y.o. female with medical history significant of asthma, GERD, tobacco abuse, cocaine abuse, history of intubation, OSA on CPAP, arthritis, who presents with SOB, cough and wheezing.  Patient reports that she started having shortness, cough and wheezing since this morning, which has been progressively getting worse. She can speak in full sentences now  Assessment & Plan:   Principal Problem:   Acute asthma exacerbation - Patient is currently on Solu-Medrol 60 mg IV 3 times a day. We'll plan on continuing. Otherwise continue current medical regimen. - Given history and continued symptoms we'll monitor closely in stepdown unit  Active Problems:   OSA (obstructive sleep apnea) - Continue home C Pap settings    GERD (gastroesophageal reflux disease) - pepcid    Tobacco abuse - continue nicotine patch    Cocaine abuse - Will reinforce abstinence on d/c    Acute respiratory failure with hypoxia (HCC) - continue current regimen and supplemental oxygen  Vaginal pruritis - Pt given diflucan - She suspects she may have Trich. Will recommend she f/u with GYN after discharge for further work up should pruritis persist.   DVT prophylaxis: Lovenox Code Status: Full Family Communication: None at bedside Disposition Plan: Pending improvement in condition   Consultants:   None   Procedures:  None   Antimicrobials: Azithromycin   Subjective: Pt states she thinks she may have Trichomonas. Her questions were answered to her satisfaction.   Objective: Filed Vitals:   05/14/16 0357 05/14/16 0400 05/14/16 0747 05/14/16 0940  BP:  141/81  126/68  Pulse:  81  93  Temp:   97.8 F (36.6 C)   TempSrc:   Oral   Resp:  14  19  Height:      Weight:      SpO2: 93% 94% 97% 98%   No intake or output data in the 24 hours ending 05/14/16 1053 Filed Weights    05/12/16 1754 05/12/16 2300  Weight: 122.471 kg (270 lb) 120.6 kg (265 lb 14 oz)    Examination:  General exam: Appears calm and comfortable  Respiratory system: diffuse exp wheezes BL, equal chest rise, poor inspiratory effort Cardiovascular system: S1 & S2 heard, RRR. No JVD, murmurs, rubs, gallops or clicks.  Gastrointestinal system: Abdomen is nondistended, soft and nontender. No organomegaly or masses felt. Normal bowel sounds heard. Central nervous system: Alert and oriented. No focal neurological deficits. Extremities: Symmetric 5 x 5 power. Skin: No rashes, lesions or ulcers Psychiatry: Judgement and insight appear normal. Mood & affect appropriate.   Data Reviewed: I have personally reviewed following labs and imaging studies  CBC:  Recent Labs Lab 05/12/16 1825 05/12/16 1836  WBC 8.9  --   NEUTROABS 5.1  --   HGB 12.9 14.6  HCT 40.3 43.0  MCV 85.7  --   PLT 289  --    Basic Metabolic Panel:  Recent Labs Lab 05/12/16 1836 05/13/16 0146  NA 140  --   K 3.6  --   CL 106  --   GLUCOSE 111*  --   BUN 14  --   CREATININE 0.80  --   MG  --  2.0   GFR: Estimated Creatinine Clearance: 111.3 mL/min (by C-G formula based on Cr of 0.8). Liver Function Tests:  Recent Labs Lab 05/13/16 0146  AST 20  ALT 13*  ALKPHOS 56  BILITOT 0.5  PROT 7.4  ALBUMIN 3.9   No results for input(s): LIPASE, AMYLASE in the last 168 hours. No results for input(s): AMMONIA in the last 168 hours. Coagulation Profile: No results for input(s): INR, PROTIME in the last 168 hours. Cardiac Enzymes: No results for input(s): CKTOTAL, CKMB, CKMBINDEX, TROPONINI in the last 168 hours. BNP (last 3 results) No results for input(s): PROBNP in the last 8760 hours. HbA1C: No results for input(s): HGBA1C in the last 72 hours. CBG: No results for input(s): GLUCAP in the last 168 hours. Lipid Profile: No results for input(s): CHOL, HDL, LDLCALC, TRIG, CHOLHDL, LDLDIRECT in the last 72  hours. Thyroid Function Tests: No results for input(s): TSH, T4TOTAL, FREET4, T3FREE, THYROIDAB in the last 72 hours. Anemia Panel: No results for input(s): VITAMINB12, FOLATE, FERRITIN, TIBC, IRON, RETICCTPCT in the last 72 hours. Sepsis Labs: No results for input(s): PROCALCITON, LATICACIDVEN in the last 168 hours.  Recent Results (from the past 240 hour(s))  MRSA PCR Screening     Status: None   Collection Time: 05/12/16 11:49 PM  Result Value Ref Range Status   MRSA by PCR NEGATIVE NEGATIVE Final    Comment:        The GeneXpert MRSA Assay (FDA approved for NASAL specimens only), is one component of a comprehensive MRSA colonization surveillance program. It is not intended to diagnose MRSA infection nor to guide or monitor treatment for MRSA infections.   Respiratory Panel by PCR     Status: None   Collection Time: 05/13/16 12:00 AM  Result Value Ref Range Status   Adenovirus NOT DETECTED NOT DETECTED Final   Coronavirus 229E NOT DETECTED NOT DETECTED Final   Coronavirus HKU1 NOT DETECTED NOT DETECTED Final   Coronavirus NL63 NOT DETECTED NOT DETECTED Final   Coronavirus OC43 NOT DETECTED NOT DETECTED Final   Metapneumovirus NOT DETECTED NOT DETECTED Final   Rhinovirus / Enterovirus NOT DETECTED NOT DETECTED Final   Influenza A NOT DETECTED NOT DETECTED Final   Influenza A H1 NOT DETECTED NOT DETECTED Final   Influenza A H1 2009 NOT DETECTED NOT DETECTED Final   Influenza A H3 NOT DETECTED NOT DETECTED Final   Influenza B NOT DETECTED NOT DETECTED Final   Parainfluenza Virus 1 NOT DETECTED NOT DETECTED Final   Parainfluenza Virus 2 NOT DETECTED NOT DETECTED Final   Parainfluenza Virus 3 NOT DETECTED NOT DETECTED Final   Parainfluenza Virus 4 NOT DETECTED NOT DETECTED Final   Respiratory Syncytial Virus NOT DETECTED NOT DETECTED Final   Bordetella pertussis NOT DETECTED NOT DETECTED Final   Chlamydophila pneumoniae NOT DETECTED NOT DETECTED Final   Mycoplasma  pneumoniae NOT DETECTED NOT DETECTED Final    Comment: Performed at [facility]  Urine culture     Status: Abnormal   Collection Time: 05/13/16 12:30 AM  Result Value Ref Range Status   Specimen Description URINE, RANDOM  Final   Special Requests NONE  Final   Culture MULTIPLE SPECIES PRESENT, SUGGEST RECOLLECTION (A)  Final   Report Status 05/14/2016 FINAL  Final  Culture, blood (routine x 2) Call MD if unable to obtain prior to antibiotics being given     Status: None (Preliminary result)   Collection Time: 05/13/16  1:39 AM  Result Value Ref Range Status   Specimen Description BLOOD LEFT ANTECUBITAL  Final   Special Requests IN PEDIATRIC BOTTLE  Final   Culture   Final    NO GROWTH < 12 HOURS Performed at  [facility]    Report Status PENDING  Incomplete  Culture, blood (routine x 2) Call MD if unable to obtain prior to antibiotics being given     Status: None (Preliminary result)   Collection Time: 05/13/16  1:46 AM  Result Value Ref Range Status   Specimen Description BLOOD LEFT HAND  Final   Special Requests IN PEDIATRIC BOTTLE  Final   Culture   Final    NO GROWTH < 12 HOURS Performed at [facility]    Report Status PENDING  Incomplete         Radiology Studies: Dg Chest Portable 1 View  05/12/2016  CLINICAL DATA:  50 y/o F; asthma attack. History of congestive heart failure and hypertension. EXAM: PORTABLE CHEST 1 VIEW COMPARISON:  Chest radiographs dated 03/26/2016. FINDINGS: Stable cardiomediastinal silhouette given differences in technique. No consolidation, pneumothorax, or pleural effusion. No acute osseous abnormality IMPRESSION: No active disease. Electronically Signed   By: Mitzi Hansen M.D.   On: 05/12/2016 18:59        Scheduled Meds: . azithromycin  250 mg Oral Daily  . enoxaparin (LOVENOX) injection  60 mg Subcutaneous Q24H  . famotidine  20 mg Oral QHS  . fluticasone  2 spray Each Nare Daily  .  guaiFENesin  600 mg Oral BID  . ipratropium  0.5 mg Nebulization Q4H  . levalbuterol  1.25 mg Nebulization Q4H  . methylPREDNISolone (SOLU-MEDROL) injection  60 mg Intravenous TID  . nicotine  21 mg Transdermal Daily   Continuous Infusions:   Time spent: > 35 minutes  Penny Pia, MD [facility] Pager [phone]  If 7PM-7AM, please contact night-coverage [email or web address] Password [facility] 05/14/2016, 10:53 AM

## 2016-05-14 NOTE — Progress Notes (Signed)
Pt c/o of pain in right upper quadrant.  8/10.  She said "its on and off for a while and feels like muscle spasms".  Informed Dr. Cena Benton and requested bmp.  Erick Blinks, RN

## 2016-05-14 NOTE — Progress Notes (Signed)
Pt has refused CPAP and BiPAP for tonight. Explained that RT is available should pt change her mind. RN aware. RT will continue to monitor.

## 2016-05-15 MED ORDER — PREDNISONE 20 MG PO TABS: ORAL_TABLET | ORAL | 0 refills | Status: DC

## 2016-05-15 MED ORDER — AZITHROMYCIN 250 MG PO TABS: ORAL_TABLET | ORAL | 0 refills | Status: DC

## 2016-05-15 MED ORDER — NYSTATIN 100000 UNIT/GM EX CREA: TOPICAL_CREAM | Freq: Two times a day (BID) | CUTANEOUS | Status: DC

## 2016-05-15 NOTE — Discharge Summary (Signed)
Physician Discharge Summary  Kathleen Rodriguez NTZ:[id number] DOB: 1965/12/11 DOA: 05/12/2016  PCP: [name], MD  Admit date: 05/12/2016 Discharge date: 05/15/2016  Time spent: > 35 minutes  Recommendations for Outpatient Follow-up:  1. Please evaluate patient and decide whether or not to prolong prednisone use. 2.  Also recommend discussing alcohol cessation   Discharge Diagnoses:  Principal Problem:   Acute asthma exacerbation Active Problems:   OSA (obstructive sleep apnea)   Asthma exacerbation   GERD (gastroesophageal reflux disease)   Tobacco abuse   Cocaine abuse   Acute respiratory failure with hypoxia [facility])   Discharge Condition: stable  Diet recommendation: Heart healthy  Filed Weights   05/12/16 1754 05/12/16 2300  Weight: 122.5 kg (270 lb) 120.6 kg (265 lb 14 oz)    History of present illness:  50 y.o. female with medical history significant of asthma, GERD, tobacco abuse, cocaine abuse, history of intubation, OSA on CPAP, arthritis, who presents with SOB, cough and wheezing. Pt diagnosed with asthma exacerbation  Hospital Course:  Asthma exacerbation - Improved with azithromycin, breathing treatments, and solumedrol - d/c on azithromycin and prednisone taper  Tinea corpis - under breasts, Will prescribe nystatin cream  Pt to f/u with pcp for further evaluation and recommendations. For known medical conditions listed above will continue medications listed below  Procedures:  None  Consultations:  None  Discharge Exam: Vitals:   05/14/16 2111 05/15/16 0500  BP: 118/78 132/77  Pulse: 84 70  Resp: 16 16  Temp: 97.9 F (36.6 C) 97.2 F (36.2 C)    General: Patient in no acute distress, alert and awake Cardiovascular: Regular rate and rhythm, no rubs Respiratory: No increased work of breathing, no wheezes  Discharge Instructions   Discharge Instructions    Call MD for:  difficulty breathing, headache or visual disturbances    Complete by:   As directed   Call MD for:  extreme fatigue    Complete by:  As directed   Call MD for:  temperature >100.4    Complete by:  As directed   Diet - low sodium heart healthy    Complete by:  As directed   Discharge instructions    Complete by:  As directed   Please avoid any tobacco use. Also follow-up with your primary care physician within the next 1 week.   Increase activity slowly    Complete by:  As directed     Current Discharge Medication List    CONTINUE these medications which have CHANGED   Details  azithromycin (ZITHROMAX) 250 MG tablet Take one tab by mouth orally daily for the next Qty: 4 each, Refills: 0    predniSONE (DELTASONE) 20 MG tablet Take 60 mg daily x 2 days, 40 mg daily x 2 days then 20 mg daily x 2 days Qty: 15 tablet, Refills: 0      CONTINUE these medications which have NOT CHANGED   Details  acetaminophen (TYLENOL) 500 MG tablet Take 1,000 mg by mouth every 6 (six) hours as needed for moderate pain.    albuterol (PROVENTIL) (2.5 MG/3ML) 0.083% nebulizer solution Take 3 mLs (2.5 mg total) by nebulization every 2 (two) hours as needed for wheezing or shortness of breath. Qty: 75 mL, Refills: 5    albuterol (VENTOLIN HFA) 108 (90 Base) MCG/ACT inhaler Inhale 2 puffs into the lungs every 6 (six) hours as needed for wheezing. Qty: 1 Inhaler, Refills: 5    budesonide (PULMICORT) 0.25 MG/2ML nebulizer solution Take 2 mLs (  0.25 mg total) by nebulization 2 (two) times daily. Qty: 60 mL, Refills: 5    fluticasone (FLONASE) 50 MCG/ACT nasal spray Place 2 sprays into the nose daily. Qty: 16 g, Refills: 0    famotidine (PEPCID) 20 MG tablet Take 1 tablet (20 mg total) by mouth at bedtime. Qty: 30 tablet, Refills: 0    guaiFENesin (MUCINEX) 600 MG 12 hr tablet Take 1 tablet (600 mg total) by mouth 2 (two) times daily. Qty: 15 tablet, Refills: 0    naproxen (NAPROSYN) 500 MG tablet Take 1 tablet (500 mg total) by mouth 2 (two) times daily with a meal. Qty: 60  tablet, Refills: 1   Associated Diagnoses: Primary osteoarthritis of both knees      STOP taking these medications     amoxicillin (AMOXIL) 500 MG capsule      fluconazole (DIFLUCAN) 150 MG tablet      loratadine (CLARITIN) 10 MG tablet      traMADol (ULTRAM) 50 MG tablet        Allergies  Allergen Reactions  . Latex Itching and Rash  . Other Hives and Itching    Ketchup  . Tomato Hives and Itching  . Wool Alcohol [Lanolin] Itching      The results of significant diagnostics from this hospitalization (including imaging, microbiology, ancillary and laboratory) are listed below for reference.    Significant Diagnostic Studies: Dg Chest Portable 1 View  Result Date: 05/12/2016 CLINICAL DATA:  50 y/o F; asthma attack. History of congestive heart failure and hypertension. EXAM: PORTABLE CHEST 1 VIEW COMPARISON:  Chest radiographs dated 03/26/2016. FINDINGS: Stable cardiomediastinal silhouette given differences in technique. No consolidation, pneumothorax, or pleural effusion. No acute osseous abnormality IMPRESSION: No active disease. Electronically Signed   By: Mitzi Hansen M.D.   On: 05/12/2016 18:59    Microbiology: Recent Results (from the past 240 hour(s))  MRSA PCR Screening     Status: None   Collection Time: 05/12/16 11:49 PM  Result Value Ref Range Status   MRSA by PCR NEGATIVE NEGATIVE Final    Comment:        The GeneXpert MRSA Assay (FDA approved for NASAL specimens only), is one component of a comprehensive MRSA colonization surveillance program. It is not intended to diagnose MRSA infection nor to guide or monitor treatment for MRSA infections.   Respiratory Panel by PCR     Status: None   Collection Time: 05/13/16 12:00 AM  Result Value Ref Range Status   Adenovirus NOT DETECTED NOT DETECTED Final   Coronavirus 229E NOT DETECTED NOT DETECTED Final   Coronavirus HKU1 NOT DETECTED NOT DETECTED Final   Coronavirus NL63 NOT DETECTED NOT  DETECTED Final   Coronavirus OC43 NOT DETECTED NOT DETECTED Final   Metapneumovirus NOT DETECTED NOT DETECTED Final   Rhinovirus / Enterovirus NOT DETECTED NOT DETECTED Final   Influenza A NOT DETECTED NOT DETECTED Final   Influenza A H1 NOT DETECTED NOT DETECTED Final   Influenza A H1 2009 NOT DETECTED NOT DETECTED Final   Influenza A H3 NOT DETECTED NOT DETECTED Final   Influenza B NOT DETECTED NOT DETECTED Final   Parainfluenza Virus 1 NOT DETECTED NOT DETECTED Final   Parainfluenza Virus 2 NOT DETECTED NOT DETECTED Final   Parainfluenza Virus 3 NOT DETECTED NOT DETECTED Final   Parainfluenza Virus 4 NOT DETECTED NOT DETECTED Final   Respiratory Syncytial Virus NOT DETECTED NOT DETECTED Final   Bordetella pertussis NOT DETECTED NOT DETECTED Final   Chlamydophila  pneumoniae NOT DETECTED NOT DETECTED Final   Mycoplasma pneumoniae NOT DETECTED NOT DETECTED Final    Comment: Performed at [facility]  Urine culture     Status: Abnormal   Collection Time: 05/13/16 12:30 AM  Result Value Ref Range Status   Specimen Description URINE, RANDOM  Final   Special Requests NONE  Final   Culture MULTIPLE SPECIES PRESENT, SUGGEST RECOLLECTION (A)  Final   Report Status 05/14/2016 FINAL  Final  Culture, blood (routine x 2) Call MD if unable to obtain prior to antibiotics being given     Status: None (Preliminary result)   Collection Time: 05/13/16  1:39 AM  Result Value Ref Range Status   Specimen Description BLOOD LEFT ANTECUBITAL  Final   Special Requests IN PEDIATRIC BOTTLE  Final   Culture   Final    NO GROWTH 1 DAY Performed at [facility]    Report Status PENDING  Incomplete  Culture, blood (routine x 2) Call MD if unable to obtain prior to antibiotics being given     Status: None (Preliminary result)   Collection Time: 05/13/16  1:46 AM  Result Value Ref Range Status   Specimen Description BLOOD LEFT HAND  Final   Special Requests IN PEDIATRIC BOTTLE  Final    Culture   Final    NO GROWTH 1 DAY Performed at [facility]    Report Status PENDING  Incomplete     Labs: Basic Metabolic Panel:  Recent Labs Lab 05/12/16 1836 05/13/16 0146 05/14/16 1048  NA 140  --  137  K 3.6  --  4.2  CL 106  --  105  CO2  --   --  25  GLUCOSE 111*  --  121*  BUN 14  --  15  CREATININE 0.80  --  0.80  CALCIUM  --   --  8.8*  MG  --  2.0 1.9   Liver Function Tests:  Recent Labs Lab 05/13/16 0146  AST 20  ALT 13*  ALKPHOS 56  BILITOT 0.5  PROT 7.4  ALBUMIN 3.9   No results for input(s): LIPASE, AMYLASE in the last 168 hours. No results for input(s): AMMONIA in the last 168 hours. CBC:  Recent Labs Lab 05/12/16 1825 05/12/16 1836  WBC 8.9  --   NEUTROABS 5.1  --   HGB 12.9 14.6  HCT 40.3 43.0  MCV 85.7  --   PLT 289  --    Cardiac Enzymes: No results for input(s): CKTOTAL, CKMB, CKMBINDEX, TROPONINI in the last 168 hours. BNP: BNP (last 3 results)  Recent Labs  08/10/15 0110 05/13/16 0146  BNP 15.4 19.3    ProBNP (last 3 results) No results for input(s): PROBNP in the last 8760 hours.  CBG: No results for input(s): GLUCAP in the last 168 hours.   Signed:  Penny Pia MD.  [facility] 05/15/2016, 9:29 AM

## 2016-05-15 NOTE — Progress Notes (Addendum)
Discharge instructions  explained to pt and script given to her. States Dr. Didn't want her to leave,but that she was going home and lay on the couch. Family coming to pick her up and take her home. Discharged via wheelchair. IV discontinued Lt arm

## 2016-05-18 LAB — CULTURE, BLOOD (ROUTINE X 2)
CULTURE: NO GROWTH
Culture: NO GROWTH

## 2016-05-23 ENCOUNTER — Telehealth: Payer: Self-pay | Admitting: Family Medicine

## 2016-05-23 NOTE — Telephone Encounter (Signed)
Patient would like to speak with the nurse. °Please follow up. ° °

## 2016-05-24 NOTE — Telephone Encounter (Signed)
Patient called to speak with the nurse.

## 2016-05-25 NOTE — Telephone Encounter (Signed)
Clld pt - pt adsvd she is still in a lot of pain; cramping and LBP. Pt states she feels she has a UTI; was not treated at hospital; requesting to be seen asap.  Due to the results of the urine culture advising/suggesting recollection will schedule pt to be seen by PCP Thursday at 9:30am. Reviewed with PCP for approval before scheduling.  Clld pt back and advsd of appointment as well.

## 2016-05-26 ENCOUNTER — Ambulatory Visit: Payer: Medicaid Other | Attending: Family Medicine | Admitting: Family Medicine

## 2016-05-26 ENCOUNTER — Encounter: Payer: Self-pay | Admitting: Family Medicine

## 2016-05-26 VITALS — BP 130/84 | HR 106 | Temp 98.4°F | Ht 66.0 in | Wt 270.4 lb

## 2016-05-26 DIAGNOSIS — Z780 Asymptomatic menopausal state: Secondary | ICD-10-CM | POA: Diagnosis not present

## 2016-05-26 DIAGNOSIS — L0292 Furuncle, unspecified: Secondary | ICD-10-CM | POA: Insufficient documentation

## 2016-05-26 DIAGNOSIS — Z9889 Other specified postprocedural states: Secondary | ICD-10-CM | POA: Diagnosis not present

## 2016-05-26 DIAGNOSIS — Z9851 Tubal ligation status: Secondary | ICD-10-CM | POA: Insufficient documentation

## 2016-05-26 DIAGNOSIS — L298 Other pruritus: Secondary | ICD-10-CM

## 2016-05-26 DIAGNOSIS — Z87448 Personal history of other diseases of urinary system: Secondary | ICD-10-CM

## 2016-05-26 DIAGNOSIS — N898 Other specified noninflammatory disorders of vagina: Secondary | ICD-10-CM | POA: Diagnosis not present

## 2016-05-26 DIAGNOSIS — N951 Menopausal and female climacteric states: Secondary | ICD-10-CM

## 2016-05-26 DIAGNOSIS — G4733 Obstructive sleep apnea (adult) (pediatric): Secondary | ICD-10-CM

## 2016-05-26 DIAGNOSIS — J438 Other emphysema: Secondary | ICD-10-CM

## 2016-05-26 DIAGNOSIS — Z87898 Personal history of other specified conditions: Secondary | ICD-10-CM

## 2016-05-26 DIAGNOSIS — Z79899 Other long term (current) drug therapy: Secondary | ICD-10-CM | POA: Insufficient documentation

## 2016-05-26 LAB — POCT URINALYSIS DIPSTICK
BILIRUBIN UA: NEGATIVE
GLUCOSE UA: NEGATIVE
KETONES UA: NEGATIVE
Leukocytes, UA: NEGATIVE
NITRITE UA: NEGATIVE
Protein, UA: 30
RBC UA: NEGATIVE
Spec Grav, UA: >=1.03
Urobilinogen, UA: 0.2
pH, UA: 6

## 2016-05-26 MED ORDER — CLONIDINE HCL 0.1 MG PO TABS: 0.1000 mg | ORAL_TABLET | Freq: Every day | ORAL | 3 refills | Status: DC

## 2016-05-26 MED ORDER — METRONIDAZOLE 0.75 % VA GEL: 1.0000 | Freq: Every day | VAGINAL | 0 refills | Status: DC

## 2016-05-26 MED ORDER — FLUCONAZOLE 150 MG PO TABS: 150.0000 mg | ORAL_TABLET | Freq: Once | ORAL | 0 refills | Status: AC

## 2016-05-26 MED FILL — VANDAZOLE VAGINAL 0.75% GEL: 0.75 | 30 days supply | Qty: 70 | Fill #0

## 2016-05-26 MED FILL — AZITHROMYCIN 250 MG TABLET: 250 | 4 days supply | Qty: 4 | Fill #0

## 2016-05-26 MED FILL — ALBUTEROL 0.083% INHAL SOLN: (2.5 MG/3ML | 7 days supply | Qty: 90 | Fill #1

## 2016-05-26 MED FILL — predniSONE 20 MG TABS: 20 | 6 days supply | Qty: 15 | Fill #0

## 2016-05-26 MED FILL — FLUCONAZOLE 150 MG TABLET: 150 | 1 days supply | Qty: 1 | Fill #0

## 2016-05-26 MED FILL — cloNIDine HCL 0.1 MG TABS: 0.1 | 30 days supply | Qty: 30 | Fill #0

## 2016-05-26 NOTE — Patient Instructions (Signed)
Menopause Menopause is the normal time of life when menstrual periods stop completely. Menopause is complete when you have missed 12 consecutive menstrual periods. It usually occurs between the ages of 48 years and 55 years. Very rarely does a woman develop menopause before the age of 40 years. At menopause, your ovaries stop producing the female hormones estrogen and progesterone. This can cause undesirable symptoms and also affect your health. Sometimes the symptoms may occur 4-5 years before the menopause begins. There is no relationship between menopause and:  Oral contraceptives.  Number of children you had.  Race.  The age your menstrual periods started (menarche). Heavy smokers and very thin women may develop menopause earlier in life. CAUSES  The ovaries stop producing the female hormones estrogen and progesterone.  Other causes include:  Surgery to remove both ovaries.  The ovaries stop functioning for no known reason.  Tumors of the pituitary gland in the brain.  Medical disease that affects the ovaries and hormone production.  Radiation treatment to the abdomen or pelvis.  Chemotherapy that affects the ovaries. SYMPTOMS   Hot flashes.  Night sweats.  Decrease in sex drive.  Vaginal dryness and thinning of the vagina causing painful intercourse.  Dryness of the skin and developing wrinkles.  Headaches.  Tiredness.  Irritability.  Memory problems.  Weight gain.  Bladder infections.  Hair growth of the face and chest.  Infertility. More serious symptoms include:  Loss of bone (osteoporosis) causing breaks (fractures).  Depression.  Hardening and narrowing of the arteries (atherosclerosis) causing heart attacks and strokes. DIAGNOSIS   When the menstrual periods have stopped for 12 straight months.  Physical exam.  Hormone studies of the blood. TREATMENT  There are many treatment choices and nearly as many questions about them. The  decisions to treat or not to treat menopausal changes is an individual choice made with your health care provider. Your health care provider can discuss the treatments with you. Together, you can decide which treatment will work best for you. Your treatment choices may include:   Hormone therapy (estrogen and progesterone).  Non-hormonal medicines.  Treating the individual symptoms with medicine (for example antidepressants for depression).  Herbal medicines that may help specific symptoms.  Counseling by a psychiatrist or psychologist.  Group therapy.  Lifestyle changes including:  Eating healthy.  Regular exercise.  Limiting caffeine and alcohol.  Stress management and meditation.  No treatment. HOME CARE INSTRUCTIONS   Take the medicine your health care provider gives you as directed.  Get plenty of sleep and rest.  Exercise regularly.  Eat a diet that contains calcium (good for the bones) and soy products (acts like estrogen hormone).  Avoid alcoholic beverages.  Do not smoke.  If you have hot flashes, dress in layers.  Take supplements, calcium, and vitamin D to strengthen bones.  You can use over-the-counter lubricants or moisturizers for vaginal dryness.  Group therapy is sometimes very helpful.  Acupuncture may be helpful in some cases. SEEK MEDICAL CARE IF:   You are not sure you are in menopause.  You are having menopausal symptoms and need advice and treatment.  You are still having menstrual periods after age 55 years.  You have pain with intercourse.  Menopause is complete (no menstrual period for 12 months) and you develop vaginal bleeding.  You need a referral to a specialist (gynecologist, psychiatrist, or psychologist) for treatment. SEEK IMMEDIATE MEDICAL CARE IF:   You have severe depression.  You have excessive vaginal bleeding.    You fell and think you have a broken bone.  You have pain when you urinate.  You develop leg or  chest pain.  You have a fast pounding heart beat (palpitations).  You have severe headaches.  You develop vision problems.  You feel a lump in your breast.  You have abdominal pain or severe indigestion.   This information is not intended to replace advice given to you by your health care provider. Make sure you discuss any questions you have with your health care provider.   Document Released: 12/31/2003 Document Revised: 06/12/2013 Document Reviewed: 05/09/2013 Elsevier Interactive Patient Education 2016 [employer]  

## 2016-05-26 NOTE — Progress Notes (Signed)
Subjective:    Patient ID: Kathleen Rodriguez, female    DOB: [birth year], 50 y.o.   MRN: [id number]  HPI 50 year old female with a history of COPD/asthma, obstructive sleep apnea, obesity with recent hospitalization for asthma exacerbation at [facility] from 05/12/16 through 05/15/16 and was discharged on prednisone and azithromycin which she is yet to pick up due to financial constraints.  She complains her asthma symptoms worsen whenever she goes outside but while she lived up [place] in [place] she never had asthma symptoms. Currently using her Pulmicort nebulizers and Proventil inhalers.Followed by [facility] and has an upcoming appointment at the end of the month.  Complains of dark colored urine, vaginal itching; of note her husband was treated for trichomoniasis. Also complains of menopausal symptoms-hot flashes and would like to have something for it.  Past Medical History:  Diagnosis Date  . Arthritis    "knees, lower back; legs, ankles" (01/27/2016)  . Asthma   . CHF (congestive heart failure) (HCC) 2016   "when I went into a coma"  . Cocaine abuse   . 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 GERD (gastroesophageal reflux disease)   . Hypertension    "doctor took me off RX in 2016" (01/27/2016)  . Influenza B April 2014   Complicated by multi-organ failure  . OSA on CPAP "since " 03/20/2013  . Pneumonia 2016  . Required emergent intubation    asthma exacerbation in 2016  . Tobacco abuse     Past Surgical History:  Procedure Laterality Date  . CESAREAN SECTION  2006  . LACERATION REPAIR Right ~ 1997   "tried to cut myself"  . TUBAL LIGATION  2006    Allergies  Allergen Reactions  . Latex Itching and Rash  . Other Hives and Itching    Ketchup  . Tomato Hives and Itching  . Wool Alcohol [Lanolin] Itching    Current Outpatient Prescriptions on File Prior to Visit  Medication Sig Dispense Refill  . acetaminophen (TYLENOL) 500 MG tablet Take 1,000 mg  by mouth every 6 (six) hours as needed for moderate pain.    Marland Kitchen albuterol (PROVENTIL) (2.5 MG/3ML) 0.083% nebulizer solution Take 3 mLs (2.5 mg total) by nebulization every 2 (two) hours as needed for wheezing or shortness of breath. 75 mL 5  . albuterol (VENTOLIN HFA) 108 (90 Base) MCG/ACT inhaler Inhale 2 puffs into the lungs every 6 (six) hours as needed for wheezing. 1 Inhaler 5  . fluticasone (FLONASE) 50 MCG/ACT nasal spray Place 2 sprays into the nose daily. 16 g 0  . guaiFENesin (MUCINEX) 600 MG 12 hr tablet Take 1 tablet (600 mg total) by mouth 2 (two) times daily. 15 tablet 0  . azithromycin (ZITHROMAX) 250 MG tablet Take one tab by mouth orally daily for the next (Patient not taking: Reported on 05/26/2016) 4 each 0  . budesonide (PULMICORT) 0.25 MG/2ML nebulizer solution Take 2 mLs (0.25 mg total) by nebulization 2 (two) times daily. (Patient not taking: Reported on 05/26/2016) 60 mL 5  . famotidine (PEPCID) 20 MG tablet Take 1 tablet (20 mg total) by mouth at bedtime. (Patient not taking: Reported on 05/12/2016) 30 tablet 0  . naproxen (NAPROSYN) 500 MG tablet Take 1 tablet (500 mg total) by mouth 2 (two) times daily with a meal. (Patient not taking: Reported on 05/12/2016) 60 tablet 1  . predniSONE (DELTASONE) 20 MG tablet Take 60 mg daily x 2 days, 40 mg daily x 2 days then  20 mg daily x 2 days (Patient not taking: Reported on 05/26/2016) 15 tablet 0   Current Facility-Administered Medications on File Prior to Visit  Medication Dose Route Frequency Provider Last Rate Last Dose  . nystatin cream (MYCOSTATIN)   Topical BID Penny Pia, MD         Review of Systems  Constitutional: Negative for activity change, appetite change and fatigue.  HENT: Negative for congestion, sinus pressure and sore throat.   Eyes: Negative for visual disturbance.  Respiratory: Negative for cough, chest tightness, shortness of breath and wheezing.   Cardiovascular: Negative for chest pain and palpitations.    Gastrointestinal: Negative for abdominal distention, abdominal pain and constipation.  Endocrine: Negative for polydipsia.  Genitourinary:       See hpi  Musculoskeletal: Negative for arthralgias and back pain.  Skin: Positive for rash.  Neurological: Negative for tremors, light-headedness and numbness.  Hematological: Does not bruise/bleed easily.  Psychiatric/Behavioral: Negative for agitation and behavioral problems.       Objective: Vitals:   05/26/16 0940  BP: 130/84  Pulse: (!) 106  Temp: 98.4 F (36.9 C)  TempSrc: Oral  SpO2: 95%  Weight: 270 lb 6.4 oz (122.7 kg)  Height: 5\' 6"  (1.676 m)      Physical Exam  Constitutional: She is oriented to person, place, and time. She appears well-developed and well-nourished.  Cardiovascular: Normal heart sounds and intact distal pulses.  Tachycardia present.   No murmur heard. Pulmonary/Chest: Effort normal. She has no wheezes. She has no rales. She exhibits no tenderness.  reduced inspiration and expiration bilaterally  Abdominal: Soft. Bowel sounds are normal. She exhibits no distension and no mass. There is no tenderness.  Musculoskeletal: Normal range of motion.  Neurological: She is alert and oriented to person, place, and time.  Skin:  Boil beneath right breast with no discharge          Assessment & Plan:  1. OSA (obstructive sleep apnea) Controlled on CPAP machine  2. Uncontrolled persistent asthma Not fully optimized She is yet to pick up her prescription for prednisone and Azithromycin due to financial constraints. Advised to walk over to the pharmacy as they will be willing to work with her to help obtain medications Encouraged compliance with medications and follow-up with [facility]  3. Urinary symptoms UA negative for UTI Advised increase fluid intake, cranberry juice We'll send of urine for GC/chlamydia/Trichomonas given history of husband with Trichomonas  4. Vaginal itching Treat  presumptively for candidiasis Placed on MetroGel as well presumptively.  5. Menopausal symptoms Trial of clonidine  6. Boil/furuncle Advised to apply warm compress and pick up antibiotics sent to pharmacy previously.  This note has been created with Education officer, environmental. Any transcriptional errors are unintentional.

## 2016-05-26 NOTE — Progress Notes (Signed)
Gave urine sample

## 2016-05-27 LAB — URINE CYTOLOGY ANCILLARY ONLY
CHLAMYDIA, DNA PROBE: NEGATIVE
Neisseria Gonorrhea: NEGATIVE
Trichomonas: NEGATIVE

## 2016-05-30 LAB — URINE CYTOLOGY ANCILLARY ONLY
BACTERIAL VAGINITIS: NEGATIVE
CANDIDA VAGINITIS: NEGATIVE

## 2016-06-02 ENCOUNTER — Other Ambulatory Visit: Payer: Self-pay | Admitting: Family Medicine

## 2016-06-10 ENCOUNTER — Telehealth: Payer: Self-pay

## 2016-06-10 NOTE — Progress Notes (Signed)
Call to patient at [phone], return call request message left with gentlemen.

## 2016-06-10 NOTE — Telephone Encounter (Signed)
-----   Message from [name] Amao, MD sent at 05/31/2016  3:48 PM EDT ----- Urine results and negative for Candida, bacterial vaginosis, Trichomonas  

## 2016-06-10 NOTE — Telephone Encounter (Signed)
Writer called patient on the only number provided.  Unable to LVM , the phone rings then disconnects.

## 2016-06-13 ENCOUNTER — Ambulatory Visit: Payer: Self-pay | Admitting: Pulmonary Disease

## 2016-06-13 ENCOUNTER — Telehealth: Payer: Self-pay

## 2016-06-13 NOTE — Telephone Encounter (Signed)
Patient called per Dr. Venetia NightAmao with negative urine results.  Patient stated understanding.

## 2016-06-13 NOTE — Telephone Encounter (Signed)
-----   Message from Jaclyn ShaggyEnobong Amao, MD sent at 05/31/2016  3:48 PM EDT ----- Urine results and negative for Candida, bacterial vaginosis, Trichomonas

## 2016-06-23 ENCOUNTER — Encounter (HOSPITAL_COMMUNITY): Payer: Self-pay | Admitting: Emergency Medicine

## 2016-06-23 ENCOUNTER — Inpatient Hospital Stay (HOSPITAL_COMMUNITY)
Admission: EM | Admit: 2016-06-23 | Discharge: 2016-06-26 | DRG: 202 | Disposition: A | Discharge status: Home / Self Care | Payer: Medicaid Other | Attending: Internal Medicine | Admitting: Internal Medicine

## 2016-06-23 DIAGNOSIS — Z6841 Body Mass Index (BMI) 40.0 and over, adult: Secondary | ICD-10-CM | POA: Diagnosis not present

## 2016-06-23 DIAGNOSIS — E669 Obesity, unspecified: Secondary | ICD-10-CM | POA: Diagnosis present

## 2016-06-23 DIAGNOSIS — D72828 Other elevated white blood cell count: Secondary | ICD-10-CM | POA: Diagnosis not present

## 2016-06-23 DIAGNOSIS — Z830 Family history of human immunodeficiency virus [HIV] disease: Secondary | ICD-10-CM | POA: Diagnosis not present

## 2016-06-23 DIAGNOSIS — G4733 Obstructive sleep apnea (adult) (pediatric): Secondary | ICD-10-CM | POA: Diagnosis not present

## 2016-06-23 DIAGNOSIS — K219 Gastro-esophageal reflux disease without esophagitis: Secondary | ICD-10-CM | POA: Diagnosis present

## 2016-06-23 DIAGNOSIS — J9601 Acute respiratory failure with hypoxia: Secondary | ICD-10-CM | POA: Diagnosis present

## 2016-06-23 DIAGNOSIS — I10 Essential (primary) hypertension: Secondary | ICD-10-CM | POA: Diagnosis present

## 2016-06-23 DIAGNOSIS — R0603 Acute respiratory distress: Secondary | ICD-10-CM

## 2016-06-23 DIAGNOSIS — J45901 Unspecified asthma with (acute) exacerbation: Secondary | ICD-10-CM | POA: Diagnosis present

## 2016-06-23 DIAGNOSIS — Z8701 Personal history of pneumonia (recurrent): Secondary | ICD-10-CM

## 2016-06-23 DIAGNOSIS — Z87891 Personal history of nicotine dependence: Secondary | ICD-10-CM | POA: Diagnosis not present

## 2016-06-23 DIAGNOSIS — Z8249 Family history of ischemic heart disease and other diseases of the circulatory system: Secondary | ICD-10-CM

## 2016-06-23 DIAGNOSIS — J9600 Acute respiratory failure, unspecified whether with hypoxia or hypercapnia: Secondary | ICD-10-CM | POA: Diagnosis present

## 2016-06-23 DIAGNOSIS — R0600 Dyspnea, unspecified: Secondary | ICD-10-CM | POA: Diagnosis present

## 2016-06-23 DIAGNOSIS — Z7952 Long term (current) use of systemic steroids: Secondary | ICD-10-CM | POA: Diagnosis not present

## 2016-06-23 DIAGNOSIS — T380X5A Adverse effect of glucocorticoids and synthetic analogues, initial encounter: Secondary | ICD-10-CM | POA: Diagnosis not present

## 2016-06-23 DIAGNOSIS — R079 Chest pain, unspecified: Secondary | ICD-10-CM | POA: Diagnosis present

## 2016-06-23 DIAGNOSIS — D72829 Elevated white blood cell count, unspecified: Secondary | ICD-10-CM | POA: Diagnosis present

## 2016-06-23 MED ORDER — FAMOTIDINE IN NACL 20-0.9 MG/50ML-% IV SOLN
20.0000 mg | Freq: Once | INTRAVENOUS | Status: AC
  Administered 2016-06-24: 20 mg via INTRAVENOUS
  Filled 2016-06-23: qty 50

## 2016-06-23 MED ORDER — MAGNESIUM SULFATE 2 GM/50ML IV SOLN
2.0000 g | Freq: Once | INTRAVENOUS | Status: AC
  Administered 2016-06-24: 2 g via INTRAVENOUS
  Filled 2016-06-23: qty 50

## 2016-06-23 MED ORDER — LORATADINE 10 MG PO TABS: 10.0000 mg | ORAL_TABLET | Freq: Once | ORAL | Status: DC

## 2016-06-23 NOTE — ED Triage Notes (Signed)
Pt arrives via EMS on CPAP, c/o respiratory distress onset this evening. Inhaler, nebs not effective. Hx of intubation for similar episodes of respiratory distress. Inspiratory/expiratory wheezing.  BP 165/100, 18G L AC. 125MG  solumedrol IV, 10MG  albuterol and 0.5MG  atrovent on board per EMS.

## 2016-06-23 NOTE — ED Provider Notes (Signed)
[facility]-EMERGENCY DEPT Provider Note   CSN: [id number] Arrival date & time: 06/23/16  2348  By signing my name below, I, Kathleen Rodriguez, attest that this documentation has been prepared under the direction and in the presence of Kathleen Moga, MD. Electronically Signed: Soijett Rodriguez, ED Scribe. 06/23/16. 12:00 AM.    History   Chief Complaint Chief Complaint  Patient presents with  . Respiratory Distress     LEVEL 5 CAVEAT: DUE TO CONDITION   HPI Kathleen Rodriguez is a 50 y.o. female with a medical hx of asthma, CHF, cocaine abuse, HTN, OSA on CPAP, who presents to the Emergency Department brought in by EMS, complaining of respiratory distress onset PTA. History provided by EMS personnel who state that the pt is on chronic prednisone and that she tried taking her albuterol inhaler prior to calling EMS with no improvement of her symptoms. EMS states that they placed the pt on CPAP initially with no improvement of her symptoms. EMS reports that they also gave the pt solu-medrol, 10 mg albuterol, and 0.5 mg atrovent and that the pt maintained her symptoms, but she didn't improve. Pt was recently admitted and intubated to ICU for similar symptoms. EMS denies the pt having any other symptoms.    The history is provided by the patient and the EMS personnel. The history is limited by the condition of the patient. No language interpreter was used.  Shortness of Breath  This is a recurrent problem. The problem occurs continuously.The problem has not changed since onset.Associated symptoms include wheezing. Pertinent negatives include no fever, no vomiting and no leg swelling. It is unknown what precipitated the problem. She has tried beta-agonist inhalers and oral steroids for the symptoms. The treatment provided no relief. She has had prior hospitalizations. She has had prior ED visits. She has had prior ICU admissions. Associated medical issues include asthma.    Past Medical History:  Diagnosis Date    . Arthritis    "knees, lower back; legs, ankles" (01/27/2016)  . Asthma   . CHF (congestive heart failure) (HCC) 2016   "when I went into a coma"  . Cocaine abuse   . Critical illness myopathy Kathleen Rodriguez 2014  . GERD (gastroesophageal reflux disease)   . Hypertension    "doctor took me off RX in 2016" (01/27/2016)  . Influenza B Kathleen Rodriguez 2014   Complicated by multi-organ failure  . OSA on CPAP "since " 03/20/2013  . Pneumonia 2016  . Required emergent intubation    asthma exacerbation in 2016  . Tobacco abuse     Patient Active Problem List   Diagnosis Date Noted  . Acute asthma exacerbation 05/12/2016  . Acute respiratory failure with hypoxia (HCC) 05/12/2016  . Tobacco abuse   . Cocaine abuse   . Osteoarthritis of both knees 03/09/2016  . Asthma with status asthmaticus 01/27/2016  . Acute respiratory distress (HCC) 01/27/2016  . Non compliance with medical treatment 08/19/2015  . Anemia, iron deficiency 08/12/2015  . Essential hypertension 07/25/2015  . Sinus tachycardia (HCC) 07/25/2015  . Acute respiratory failure with hypercapnia (HCC) 07/24/2015  . COPD (chronic obstructive pulmonary disease) (HCC) 12/28/2014  . Tracheobronchitis 12/28/2014  . Dysfunctional uterine bleeding 12/28/2014  . Anemia 12/28/2014  . Pleuritic chest pain 05/16/2014  . Morbid obesity (HCC) 05/16/2014  . Chronic cough 05/01/2014  . Upper airway cough syndrome 05/01/2014  . Vaginal candidiasis 04/09/2014  . GERD (gastroesophageal reflux disease) 04/08/2014  . Medically noncompliant 04/07/2014  . Asthma exacerbation 04/06/2014  .  CAP (community acquired pneumonia) 04/06/2014  . Hypertension 09/24/2013  . OSA (obstructive sleep apnea) 03/20/2013  . Uncontrolled persistent asthma 02/05/2013    Past Surgical History:  Procedure Laterality Date  . CESAREAN SECTION  2006  . LACERATION REPAIR Right ~ 1997   "tried to cut myself"  . TUBAL LIGATION  2006    OB History    No data available        Home Medications    Prior to Admission medications   Medication Sig Start Date End Date Taking? Authorizing Provider  acetaminophen (TYLENOL) 500 MG tablet Take 1,000 mg by mouth every 6 (six) hours as needed for moderate pain.    Historical Provider, MD  albuterol (PROVENTIL) (2.5 MG/3ML) 0.083% nebulizer solution Take 3 mLs (2.5 mg total) by nebulization every 2 (two) hours as needed for wheezing or shortness of breath. 03/09/16   Kathleen S Parrett, NP  albuterol (VENTOLIN HFA) 108 (90 Base) MCG/ACT inhaler Inhale 2 puffs into the lungs every 6 (six) hours as needed for wheezing. 03/26/16   Kathleen Pander, MD  azithromycin (ZITHROMAX) 250 MG tablet Take one tab by mouth orally daily for the next Patient not taking: Reported on 05/26/2016 05/15/16   Kathleen Pia, MD  budesonide (PULMICORT) 0.25 MG/2ML nebulizer solution Take 2 mLs (0.25 mg total) by nebulization 2 (two) times daily. Patient not taking: Reported on 05/26/2016 03/09/16   Kathleen Bouquet Parrett, NP  cloNIDine (CATAPRES) 0.1 MG tablet Take 1 tablet (0.1 mg total) by mouth at bedtime. For hot flashes 05/26/16   Kathleen Shaggy, MD  famotidine (PEPCID) 20 MG tablet Take 1 tablet (20 mg total) by mouth at bedtime. Patient not taking: Reported on 05/12/2016 01/28/16   Kathleen Bees, MD  fluticasone [facility]) 50 MCG/ACT nasal spray Place 2 sprays into the nose daily. 02/21/13   Kathleen Person, NP  guaiFENesin (MUCINEX) 600 MG 12 hr tablet Take 1 tablet (600 mg total) by mouth 2 (two) times daily. 01/28/16   Kathleen Levora Dredge, MD  metroNIDAZOLE (METROGEL VAGINAL) 0.75 % vaginal gel Place 1 Applicatorful vaginally at bedtime. 05/26/16   Kathleen Shaggy, MD  naproxen (NAPROSYN) 500 MG tablet Take 1 tablet (500 mg total) by mouth 2 (two) times daily with a meal. Patient not taking: Reported on 05/12/2016 03/09/16   Kathleen Shaggy, MD  predniSONE (DELTASONE) 20 MG tablet Take 60 mg daily x 2 days, 40 mg daily x 2 days then 20 mg daily x 2 days Patient not  taking: Reported on 05/26/2016 05/15/16   Kathleen Pia, MD    Family History Family History  Problem Relation Age of Onset  . Hypertension Mother   . HIV/AIDS Father     Social History Social History  Substance Use Topics  . Smoking status: Current Some Day Smoker    Packs/day: 0.25    Years: 20.00    Types: Cigarettes    Last attempt to quit: 01/31/2013  . Smokeless tobacco: Never Used  . Alcohol use No     Comment: 01/26/2006 "clean from drinking since 2007"     Allergies   Latex; Other; Tomato; and Wool alcohol [lanolin]   Review of Systems Review of Systems  Unable to perform ROS: Acuity of condition  Constitutional: Negative for fever.  Respiratory: Positive for shortness of breath and wheezing.   Cardiovascular: Negative for palpitations and leg swelling.  Gastrointestinal: Negative for vomiting.     Physical Exam Updated Vital Signs BP 122/91 (BP Location: Right Arm)  Pulse 90   Temp 98.4 F (36.9 C) (Axillary)   Resp (!) 32   SpO2 100%   Physical Exam  Constitutional: She is oriented to Rodriguez, place, and time. She appears well-developed and well-nourished. No distress.  HENT:  Head: Normocephalic and atraumatic.  Mouth/Throat: Oropharynx is clear and moist. No oropharyngeal exudate.  Eyes: Conjunctivae and EOM are normal. Pupils are equal, round, and reactive to light.  Neck: Trachea normal. Neck supple. No JVD present. Carotid bruit is not present. No tracheal deviation present.  Trachea midline. No stridor. No bruits.   Cardiovascular: Normal rate, regular rhythm and normal heart sounds.  Exam reveals no gallop and no friction rub.   No murmur heard. Intact dorsalis pedis pulse.   Pulmonary/Chest: No stridor. She is in respiratory distress. She has wheezes. She has no rales.  Inspiratory and expiratory wheezes.   Abdominal: Soft. Bowel sounds are normal. She exhibits no distension and no mass. There is no tenderness. There is no rebound and no  guarding.  Musculoskeletal: Normal range of motion. She exhibits no edema.  Soft compartments. No palpable cords.  Neurological: She is alert and oriented to Rodriguez, place, and time. She has normal reflexes.  DTR's intact.   Skin: Skin is warm and dry. Capillary refill takes less than 2 seconds. She is not diaphoretic.  Psychiatric: She has a normal mood and affect. Her behavior is normal.  Nursing note and vitals reviewed.    ED Treatments / Results  DIAGNOSTIC STUDIES: Oxygen Saturation is 100% on RA, nl by my interpretation.    COORDINATION OF CARE: 11:58 PM Discussed treatment plan with pt at bedside which includes labs, EKG, and CXR and pt agreed to plan.  1:47 AM- Consult with hospitalist, Dr. Katrinka BlazingSmith who recommends inpatient stepdown. Vitals:   06/24/16 0100 06/24/16 0115  BP: 141/86 154/72  Pulse: 94 99  Resp: 16 16  Temp:      Labs (all labs ordered are listed, but only abnormal results are displayed) Labs Reviewed  CBC WITH DIFFERENTIAL/PLATELET - Abnormal; Notable for the following:       Result Value   WBC 11.0 (*)    RDW 16.3 (*)    Monocytes Absolute 1.2 (*)    All other components within normal limits  I-STAT CHEM 8, ED - Abnormal; Notable for the following:    Glucose, Bld 119 (*)    Calcium, Ion 1.07 (*)    All other components within normal limits  I-STAT ARTERIAL BLOOD GAS, ED - Abnormal; Notable for the following:    pO2, Arterial 243.0 (*)    All other components within normal limits  BRAIN NATRIURETIC PEPTIDE  BLOOD GAS, ARTERIAL  I-STAT TROPOININ, ED   Results for orders placed or performed during the hospital encounter of 06/23/16  CBC with Differential/Platelet  Result Value Ref Range   WBC 11.0 (H) 4.0 - 10.5 K/uL   RBC 4.59 3.87 - 5.11 MIL/uL   Hemoglobin 12.6 12.0 - 15.0 g/dL   HCT 16.139.7 09.636.0 - 04.546.0 %   MCV 86.5 78.0 - 100.0 fL   MCH 27.5 26.0 - 34.0 pg   MCHC 31.7 30.0 - 36.0 g/dL   RDW 40.916.3 (H) 81.111.5 - 91.415.5 %   Platelets 279 150 -  400 K/uL   Neutrophils Relative % 52 %   Neutro Abs 5.8 1.7 - 7.7 K/uL   Lymphocytes Relative 36 %   Lymphs Abs 4.0 0.7 - 4.0 K/uL   Monocytes Relative 11 %  Monocytes Absolute 1.2 (H) 0.1 - 1.0 K/uL   Eosinophils Relative 1 %   Eosinophils Absolute 0.1 0.0 - 0.7 K/uL   Basophils Relative 0 %   Basophils Absolute 0.0 0.0 - 0.1 K/uL  Brain natriuretic peptide  Result Value Ref Range   B Natriuretic Peptide 24.0 0.0 - 100.0 pg/mL  I-Stat Chem 8, ED  Result Value Ref Range   Sodium 142 135 - 145 mmol/L   Potassium 3.6 3.5 - 5.1 mmol/L   Chloride 107 101 - 111 mmol/L   BUN 17 6 - 20 mg/dL   Creatinine, Ser 4.09 0.44 - 1.00 mg/dL   Glucose, Bld 811 (H) 65 - 99 mg/dL   Calcium, Ion 9.14 (L) 1.15 - 1.40 mmol/L   TCO2 25 0 - 100 mmol/L   Hemoglobin 14.3 12.0 - 15.0 g/dL   HCT 78.2 95.6 - 21.3 %  I-stat troponin, ED  Result Value Ref Range   Troponin i, poc 0.00 0.00 - 0.08 ng/mL   Comment 3          I-Stat arterial blood gas, ED  Result Value Ref Range   pH, Arterial 7.377 7.350 - 7.450   pCO2 arterial 42.0 32.0 - 48.0 mmHg   pO2, Arterial 243.0 (H) 83.0 - 108.0 mmHg   Bicarbonate 24.6 20.0 - 28.0 mmol/L   TCO2 26 0 - 100 mmol/L   O2 Saturation 100.0 %   Acid-base deficit 1.0 0.0 - 2.0 mmol/L   Patient temperature 37.0 C    Sample type ARTERIAL    Dg Chest Portable 1 View  Result Date: 06/24/2016 CLINICAL DATA:  Dyspnea.  Recent ICU admission. EXAM: PORTABLE CHEST 1 VIEW COMPARISON:  05/12/2016 FINDINGS: A single AP portable view of the chest demonstrates no focal airspace consolidation or alveolar edema. The lungs are grossly clear. There is no large effusion or pneumothorax. Cardiac and mediastinal contours appear unremarkable. IMPRESSION: No active disease. Electronically Signed   By: Ellery Plunk M.D.   On: 06/24/2016 00:15   EKG  EKG Interpretation  Date/Time:  Thursday June 23 2016 23:55:23 EDT Ventricular Rate:  85 PR Interval:    QRS Duration: 92 QT  Interval:  375 QTC Calculation: 446 R Axis:   79 Text Interpretation:  Sinus rhythm Short PR interval Confirmed by [facility]  MD, Shaiden Aldous ([id number]) on 06/24/2016 12:01:30 AM       Radiology Dg Chest Portable 1 View  Result Date: 06/24/2016 CLINICAL DATA:  Dyspnea.  Recent ICU admission. EXAM: PORTABLE CHEST 1 VIEW COMPARISON:  05/12/2016 FINDINGS: A single AP portable view of the chest demonstrates no focal airspace consolidation or alveolar edema. The lungs are grossly clear. There is no large effusion or pneumothorax. Cardiac and mediastinal contours appear unremarkable. IMPRESSION: No active disease. Electronically Signed   By: Ellery Plunk M.D.   On: 06/24/2016 00:15    Procedures Procedures (including critical care time)  Medications Ordered in ED Medications  magnesium sulfate IVPB 2 g 50 mL (not administered)  loratadine (CLARITIN) tablet 10 mg (not administered)  famotidine (PEPCID) IVPB 20 mg premix (not administered)     Initial Impression / Assessment and Plan / ED Course  I have reviewed the triage vital signs and the nursing notes.  Pertinent labs & imaging results that were available during my care of the patient were reviewed by me and considered in my medical decision making (see chart for details).  Clinical Course   BIPAP initiated by me MDM Reviewed: previous chart, nursing  note and vitals Reviewed previous: labs Interpretation: labs, ECG and x-ray (NACPD by me on CXR, no elevated troponin) Total time providing critical care: 75-105 minutes. This excludes time spent performing separately reportable procedures and services. Consults: admitting MD  CRITICAL CARE Performed by: Jasmine Awe Total critical care time: 90 minutes Critical care time was exclusive of separately billable procedures and treating other patients. Critical care was necessary to treat or prevent imminent or life-threatening deterioration. Critical care was time spent  personally by me on the following activities: development of treatment plan with patient and/or surrogate as well as nursing, discussions with consultants, evaluation of patient's response to treatment, examination of patient, obtaining history from patient or surrogate, ordering and performing treatments and interventions, ordering and review of laboratory studies, ordering and review of radiographic studies, pulse oximetry and re-evaluation of patient's condition.  Final Clinical Impressions(s) / ED Diagnoses   Final diagnoses:  None    New Prescriptions New Prescriptions   No medications on file  I personally performed the services described in this documentation, which was scribed in my presence. The recorded information has been reviewed and is accurate.      Cy Blamer, MD 06/24/16 [phone]

## 2016-06-24 ENCOUNTER — Ambulatory Visit: Payer: Self-pay | Admitting: Adult Health

## 2016-06-24 ENCOUNTER — Encounter (HOSPITAL_COMMUNITY): Payer: Self-pay | Admitting: Emergency Medicine

## 2016-06-24 ENCOUNTER — Emergency Department (HOSPITAL_COMMUNITY): Payer: Medicaid Other

## 2016-06-24 DIAGNOSIS — J45902 Unspecified asthma with status asthmaticus: Secondary | ICD-10-CM | POA: Insufficient documentation

## 2016-06-24 DIAGNOSIS — Z830 Family history of human immunodeficiency virus [HIV] disease: Secondary | ICD-10-CM | POA: Diagnosis not present

## 2016-06-24 DIAGNOSIS — R0600 Dyspnea, unspecified: Secondary | ICD-10-CM | POA: Diagnosis present

## 2016-06-24 DIAGNOSIS — Z87891 Personal history of nicotine dependence: Secondary | ICD-10-CM | POA: Diagnosis not present

## 2016-06-24 DIAGNOSIS — Z6841 Body Mass Index (BMI) 40.0 and over, adult: Secondary | ICD-10-CM | POA: Diagnosis not present

## 2016-06-24 DIAGNOSIS — I10 Essential (primary) hypertension: Secondary | ICD-10-CM | POA: Diagnosis present

## 2016-06-24 DIAGNOSIS — T380X5A Adverse effect of glucocorticoids and synthetic analogues, initial encounter: Secondary | ICD-10-CM | POA: Diagnosis present

## 2016-06-24 DIAGNOSIS — E669 Obesity, unspecified: Secondary | ICD-10-CM | POA: Diagnosis present

## 2016-06-24 DIAGNOSIS — R079 Chest pain, unspecified: Secondary | ICD-10-CM

## 2016-06-24 DIAGNOSIS — D72829 Elevated white blood cell count, unspecified: Secondary | ICD-10-CM | POA: Diagnosis present

## 2016-06-24 DIAGNOSIS — Z8249 Family history of ischemic heart disease and other diseases of the circulatory system: Secondary | ICD-10-CM | POA: Diagnosis not present

## 2016-06-24 DIAGNOSIS — K219 Gastro-esophageal reflux disease without esophagitis: Secondary | ICD-10-CM

## 2016-06-24 DIAGNOSIS — Z7952 Long term (current) use of systemic steroids: Secondary | ICD-10-CM | POA: Diagnosis not present

## 2016-06-24 DIAGNOSIS — Z8701 Personal history of pneumonia (recurrent): Secondary | ICD-10-CM | POA: Diagnosis not present

## 2016-06-24 DIAGNOSIS — J9600 Acute respiratory failure, unspecified whether with hypoxia or hypercapnia: Secondary | ICD-10-CM

## 2016-06-24 DIAGNOSIS — D72828 Other elevated white blood cell count: Secondary | ICD-10-CM | POA: Diagnosis present

## 2016-06-24 DIAGNOSIS — G4733 Obstructive sleep apnea (adult) (pediatric): Secondary | ICD-10-CM | POA: Diagnosis present

## 2016-06-24 DIAGNOSIS — J45901 Unspecified asthma with (acute) exacerbation: Principal | ICD-10-CM

## 2016-06-24 DIAGNOSIS — J9601 Acute respiratory failure with hypoxia: Secondary | ICD-10-CM | POA: Diagnosis present

## 2016-06-24 LAB — CBC WITH DIFFERENTIAL/PLATELET
Basophils Absolute: 0 10*3/uL (ref 0.0–0.1)
Basophils Relative: 0 %
Eosinophils Absolute: 0.1 10*3/uL (ref 0.0–0.7)
Eosinophils Relative: 1 %
HCT: 39.7 % (ref 36.0–46.0)
Hemoglobin: 12.6 g/dL (ref 12.0–15.0)
Lymphocytes Relative: 36 %
Lymphs Abs: 4 10*3/uL (ref 0.7–4.0)
MCH: 27.5 pg (ref 26.0–34.0)
MCHC: 31.7 g/dL (ref 30.0–36.0)
MCV: 86.5 fL (ref 78.0–100.0)
Monocytes Absolute: 1.2 10*3/uL — ABNORMAL HIGH (ref 0.1–1.0)
Monocytes Relative: 11 %
Neutro Abs: 5.8 10*3/uL (ref 1.7–7.7)
Neutrophils Relative %: 52 %
Platelets: 279 10*3/uL (ref 150–400)
RBC: 4.59 MIL/uL (ref 3.87–5.11)
RDW: 16.3 % — ABNORMAL HIGH (ref 11.5–15.5)
WBC: 11 10*3/uL — ABNORMAL HIGH (ref 4.0–10.5)

## 2016-06-24 LAB — RAPID URINE DRUG SCREEN, HOSP PERFORMED
Amphetamines: NOT DETECTED
Barbiturates: NOT DETECTED
Benzodiazepines: NOT DETECTED
Cocaine: NOT DETECTED
Opiates: NOT DETECTED
Tetrahydrocannabinol: NOT DETECTED

## 2016-06-24 LAB — URINALYSIS, ROUTINE W REFLEX MICROSCOPIC
Bilirubin Urine: NEGATIVE
Glucose, UA: NEGATIVE mg/dL
Hgb urine dipstick: NEGATIVE
Ketones, ur: NEGATIVE mg/dL
Leukocytes, UA: NEGATIVE
Nitrite: NEGATIVE
Protein, ur: NEGATIVE mg/dL
Specific Gravity, Urine: 1.039 — ABNORMAL HIGH (ref 1.005–1.030)
pH: 5 (ref 5.0–8.0)

## 2016-06-24 LAB — I-STAT CHEM 8, ED
BUN: 17 mg/dL (ref 6–20)
CREATININE: 0.8 mg/dL (ref 0.44–1.00)
Calcium, Ion: 1.07 mmol/L — ABNORMAL LOW (ref 1.15–1.40)
Chloride: 107 mmol/L (ref 101–111)
Glucose, Bld: 119 mg/dL — ABNORMAL HIGH (ref 65–99)
HEMATOCRIT: 42 % (ref 36.0–46.0)
HEMOGLOBIN: 14.3 g/dL (ref 12.0–15.0)
POTASSIUM: 3.6 mmol/L (ref 3.5–5.1)
SODIUM: 142 mmol/L (ref 135–145)
TCO2: 25 mmol/L (ref 0–100)

## 2016-06-24 LAB — I-STAT ARTERIAL BLOOD GAS, ED
ACID-BASE DEFICIT: 1 mmol/L (ref 0.0–2.0)
Bicarbonate: 24.6 mmol/L (ref 20.0–28.0)
O2 Saturation: 100 %
PH ART: 7.377 (ref 7.350–7.450)
TCO2: 26 mmol/L (ref 0–100)
pCO2 arterial: 42 mmHg (ref 32.0–48.0)
pO2, Arterial: 243 mmHg — ABNORMAL HIGH (ref 83.0–108.0)

## 2016-06-24 LAB — BRAIN NATRIURETIC PEPTIDE: B NATRIURETIC PEPTIDE 5: 24 pg/mL (ref 0.0–100.0)

## 2016-06-24 LAB — I-STAT TROPONIN, ED: Troponin i, poc: 0 ng/mL (ref 0.00–0.08)

## 2016-06-24 MED ORDER — OXYMETAZOLINE HCL 0.05 % NA SOLN
1.0000 | Freq: Two times a day (BID) | NASAL | Status: DC
  Administered 2016-06-24 – 2016-06-26 (×4): 1 via NASAL
  Filled 2016-06-24 (×2): qty 15

## 2016-06-24 MED ORDER — BUDESONIDE 0.25 MG/2ML IN SUSP
0.2500 mg | Freq: Two times a day (BID) | RESPIRATORY_TRACT | Status: DC
  Administered 2016-06-24 – 2016-06-26 (×5): 0.25 mg via RESPIRATORY_TRACT
  Filled 2016-06-24 (×5): qty 2

## 2016-06-24 MED ORDER — PANTOPRAZOLE SODIUM 40 MG PO TBEC
40.0000 mg | DELAYED_RELEASE_TABLET | Freq: Every day | ORAL | Status: DC
  Administered 2016-06-25 – 2016-06-26 (×2): 40 mg via ORAL
  Filled 2016-06-24 (×3): qty 1

## 2016-06-24 MED ORDER — FLUTICASONE PROPIONATE 50 MCG/ACT NA SUSP
2.0000 | Freq: Every day | NASAL | Status: DC
  Administered 2016-06-25 – 2016-06-26 (×2): 2 via NASAL
  Filled 2016-06-24 (×2): qty 16

## 2016-06-24 MED ORDER — GUAIFENESIN ER 600 MG PO TB12
600.0000 mg | ORAL_TABLET | Freq: Two times a day (BID) | ORAL | Status: DC
  Administered 2016-06-24 – 2016-06-26 (×5): 600 mg via ORAL
  Filled 2016-06-24 (×6): qty 1

## 2016-06-24 MED ORDER — DOXYCYCLINE HYCLATE 100 MG PO TABS
100.0000 mg | ORAL_TABLET | Freq: Two times a day (BID) | ORAL | Status: DC
  Administered 2016-06-24 – 2016-06-26 (×5): 100 mg via ORAL
  Filled 2016-06-24 (×6): qty 1

## 2016-06-24 MED ORDER — IPRATROPIUM BROMIDE 0.02 % IN SOLN: 0.5000 mg | Freq: Four times a day (QID) | RESPIRATORY_TRACT | Status: DC

## 2016-06-24 MED ORDER — PREDNISONE 20 MG PO TABS: 40.0000 mg | ORAL_TABLET | Freq: Every day | ORAL | Status: DC

## 2016-06-24 MED ORDER — ACETAMINOPHEN 325 MG PO TABS
650.0000 mg | ORAL_TABLET | Freq: Four times a day (QID) | ORAL | Status: DC | PRN
  Administered 2016-06-24 – 2016-06-25 (×3): 650 mg via ORAL
  Filled 2016-06-24 (×3): qty 2

## 2016-06-24 MED ORDER — ENOXAPARIN SODIUM 40 MG/0.4ML ~~LOC~~ SOLN
40.0000 mg | Freq: Every day | SUBCUTANEOUS | Status: DC
  Administered 2016-06-24 – 2016-06-26 (×3): 40 mg via SUBCUTANEOUS
  Filled 2016-06-24 (×3): qty 0.4

## 2016-06-24 MED ORDER — ALBUTEROL SULFATE (2.5 MG/3ML) 0.083% IN NEBU: 2.5000 mg | INHALATION_SOLUTION | Freq: Four times a day (QID) | RESPIRATORY_TRACT | Status: DC

## 2016-06-24 MED ORDER — ALBUTEROL SULFATE (2.5 MG/3ML) 0.083% IN NEBU
2.5000 mg | INHALATION_SOLUTION | RESPIRATORY_TRACT | Status: DC | PRN
  Administered 2016-06-24: 2.5 mg via RESPIRATORY_TRACT
  Filled 2016-06-24: qty 3

## 2016-06-24 MED ORDER — ALBUTEROL (5 MG/ML) CONTINUOUS INHALATION SOLN
INHALATION_SOLUTION | RESPIRATORY_TRACT | Status: AC
  Filled 2016-06-24: qty 20

## 2016-06-24 MED ORDER — ACETAMINOPHEN 500 MG PO TABS: 1000.0000 mg | ORAL_TABLET | Freq: Four times a day (QID) | ORAL | Status: DC | PRN

## 2016-06-24 MED ORDER — CLONIDINE HCL 0.1 MG PO TABS: 0.1000 mg | ORAL_TABLET | Freq: Every evening | ORAL | Status: DC | PRN

## 2016-06-24 MED ORDER — IPRATROPIUM-ALBUTEROL 0.5-2.5 (3) MG/3ML IN SOLN
3.0000 mL | Freq: Four times a day (QID) | RESPIRATORY_TRACT | Status: DC
  Administered 2016-06-24 – 2016-06-26 (×9): 3 mL via RESPIRATORY_TRACT
  Filled 2016-06-24 (×9): qty 3

## 2016-06-24 MED ORDER — METHYLPREDNISOLONE SODIUM SUCC 125 MG IJ SOLR
60.0000 mg | Freq: Three times a day (TID) | INTRAMUSCULAR | Status: DC
  Administered 2016-06-24 – 2016-06-25 (×4): 60 mg via INTRAVENOUS
  Filled 2016-06-24 (×4): qty 2

## 2016-06-24 MED ORDER — IPRATROPIUM-ALBUTEROL 0.5-2.5 (3) MG/3ML IN SOLN
3.0000 mL | Freq: Four times a day (QID) | RESPIRATORY_TRACT | Status: DC
  Administered 2016-06-24: 3 mL via RESPIRATORY_TRACT

## 2016-06-24 MED ORDER — ALBUTEROL (5 MG/ML) CONTINUOUS INHALATION SOLN
10.0000 mg/h | INHALATION_SOLUTION | RESPIRATORY_TRACT | Status: DC
Start: 2016-06-24 — End: 2016-06-24
  Administered 2016-06-24: 10 mg/h via RESPIRATORY_TRACT

## 2016-06-24 MED ORDER — IPRATROPIUM-ALBUTEROL 0.5-2.5 (3) MG/3ML IN SOLN
3.0000 mL | Freq: Four times a day (QID) | RESPIRATORY_TRACT | Status: DC
  Filled 2016-06-24: qty 3

## 2016-06-24 MED ORDER — ACETAMINOPHEN 650 MG RE SUPP
650.0000 mg | Freq: Four times a day (QID) | RECTAL | Status: DC | PRN
Start: 2016-06-24 — End: 2016-06-26

## 2016-06-24 MED ORDER — ONDANSETRON HCL 4 MG PO TABS
4.0000 mg | ORAL_TABLET | Freq: Four times a day (QID) | ORAL | Status: DC | PRN
  Administered 2016-06-24: 4 mg via ORAL
  Filled 2016-06-24: qty 1

## 2016-06-24 MED ORDER — IPRATROPIUM BROMIDE 0.02 % IN SOLN
RESPIRATORY_TRACT | Status: AC
  Filled 2016-06-24: qty 5

## 2016-06-24 MED ORDER — SODIUM CHLORIDE 0.9 % IV SOLN
1.0000 g | Freq: Once | INTRAVENOUS | Status: AC
  Administered 2016-06-24: 1 g via INTRAVENOUS
  Filled 2016-06-24: qty 10

## 2016-06-24 MED ORDER — LORATADINE 10 MG PO TABS
10.0000 mg | ORAL_TABLET | Freq: Every day | ORAL | Status: DC
  Administered 2016-06-24 – 2016-06-26 (×3): 10 mg via ORAL
  Filled 2016-06-24 (×4): qty 1

## 2016-06-24 MED ORDER — ONDANSETRON HCL 4 MG/2ML IJ SOLN: 4.0000 mg | Freq: Four times a day (QID) | INTRAMUSCULAR | Status: DC | PRN

## 2016-06-24 MED ORDER — IPRATROPIUM BROMIDE 0.02 % IN SOLN
1.0000 mg | Freq: Once | RESPIRATORY_TRACT | Status: AC
  Administered 2016-06-24: 1 mg via RESPIRATORY_TRACT

## 2016-06-24 NOTE — Care Management Note (Signed)
Case Management Note  Patient Details  Name: Kathleen Rodriguez MRN: [id number] Date of Birth: 1966/04/23  Subjective/Objective:  Patient lives with her son, pta indep.  [facility], she states she will need bus pass at transportation.  Patient states she has most of her meds at home. If started on any new med she may have to have asst with that because [facility] is closed over the weekend.  NCM will cont to follow for dc needs.                   Action/Plan:   Expected Discharge Date:  06/25/16               Expected Discharge Plan:  Home/Self Care  In-House Referral:     Discharge planning Services  CM Consult, Follow-up appt scheduled, [facility], Medication Assistance  Post Acute Care Choice:    Choice offered to:     DME Arranged:    DME Agency:     HH Arranged:    HH Agency:     Status of Service:  In process, will continue to follow  If discussed at Long Length of Stay Meetings, dates discussed:    Additional Comments:  Leone Havenaylor, Meleah Demeyer Clinton, RN 06/24/2016, 4:20 PM

## 2016-06-24 NOTE — ED Notes (Signed)
Admitting MD at bedside.

## 2016-06-24 NOTE — Progress Notes (Signed)
PROGRESS NOTE        PATIENT DETAILS Name: Kathleen Rodriguez Age: 50 y.o. Sex: female Date of Birth: 26-Jan-1966 Admit Date: 06/23/2016 Admitting Physician Clydie Braunondell A Smith, MD UJW:[id number]:[name], Venetia NightAmao, MD  Brief Narrative: Patient is a 50 y.o. female with history of asthma Amma OSA on CPAP, ongoing tobacco abuse presented with acute hypoxemic respiratory failure requiring BiPAP on admission secondary to asthma exacerbation. Admitted and started on steroids, bronchodilators with significant improvement, liberated off BiPAP this morning and transition to nasal cannula. See below for further details.  Subjective: Feels better-although still feels "tight".  Assessment/Plan: Active Problems: Acute hypoxemic respiratory failure: Secondary to asthma exacerbation, required BiPAP on admission. She has been liberated off BiPAP, and is now on nasal cannula. Continue treatment for asthma exacerbation.  Asthma exacerbation: Although improved, still wheezing-but moving air. BiPAP at bedside. Continue Solu-Medrol, bronchodilators and empiric doxycycline. She complains of significant nasal congestion and postnasal drip-start scheduled Claritin, Afrin for a total of 6 doses-continue Flonase. Follow closely.  GERD: Continue PPI  Mild hypocalcemia: Recheck electrolytes in a.m. Given IV calcium gluconate on admission.  Prior history of cocaine/tobacco abuse: Denies any current ongoing polysubstance use. Urine drug screen on admission negative, however on 03/26/16-urine drug screen positive for cocaine.  DVT Prophylaxis: Prophylactic Lovenox  Code Status: Full code   Family Communication: None at bedside  Disposition Plan: Remain inpatient-home likely over the weekend  Antimicrobial agents: Doxycycline 9/1>>  Procedures: None  CONSULTS:  None  Time spent: 25 minutes-Greater than 50% of this time was spent in counseling, explanation of diagnosis, planning of further  management, and coordination of care.  MEDICATIONS: Anti-infectives    Start     Dose/Rate Route Frequency Ordered Stop   06/24/16 1000  doxycycline (VIBRA-TABS) tablet 100 mg     100 mg Oral Every 12 hours 06/24/16 0755        Scheduled Meds: . albuterol      . budesonide  0.25 mg Nebulization BID  . doxycycline  100 mg Oral Q12H  . enoxaparin (LOVENOX) injection  40 mg Subcutaneous Daily  . fluticasone  2 spray Each Nare Daily  . guaiFENesin  600 mg Oral BID  . ipratropium      . ipratropium-albuterol  3 mL Nebulization QID  . loratadine  10 mg Oral Daily  . methylPREDNISolone (SOLU-MEDROL) injection  60 mg Intravenous Q8H  . oxymetazoline  1 spray Each Nare BID  . pantoprazole  40 mg Oral Q1200   Continuous Infusions:  PRN Meds:.acetaminophen **OR** acetaminophen, albuterol, cloNIDine, ondansetron **OR** ondansetron (ZOFRAN) IV   PHYSICAL EXAM: Vital signs: Vitals:   06/24/16 0334 06/24/16 0723 06/24/16 0812 06/24/16 0814  BP: (!) 162/78 138/76    Pulse: 96 (!) 102    Resp: (!) 26     Temp: 98 F (36.7 C) 97.8 F (36.6 C)    TempSrc: Oral Axillary    SpO2: 99% 100% 98% 99%  Weight: 124.9 kg (275 lb 5.7 oz)     Height: 5\' 6"  (1.676 m)      Filed Weights   06/24/16 0334  Weight: 124.9 kg (275 lb 5.7 oz)   Body mass index is 44.44 kg/m.   Gen Exam: Awake and alert with clear speech. Not in any distress  Neck: Supple, No JVD.   Chest: Moving air-but has coarse rhonchi all over CVS:  S1 S2 Regular, no murmurs.  Abdomen: soft, BS +, non tender, non distended.  Extremities: no edema, lower extremities warm to touch. Neurologic: Non Focal.   Skin: No Rash or lesions  Wounds: N/A.    I have personally reviewed following labs and imaging studies  LABORATORY DATA: CBC:  Recent Labs Lab 06/23/16 2359 06/24/16 0012  WBC 11.0*  --   NEUTROABS 5.8  --   HGB 12.6 14.3  HCT 39.7 42.0  MCV 86.5  --   PLT 279  --     Basic Metabolic Panel:  Recent  Labs Lab 06/24/16 0012  NA 142  K 3.6  CL 107  GLUCOSE 119*  BUN 17  CREATININE 0.80    GFR: Estimated Creatinine Clearance: 113.6 mL/min (by C-G formula based on SCr of 0.8 mg/dL).  Liver Function Tests: No results for input(s): AST, ALT, ALKPHOS, BILITOT, PROT, ALBUMIN in the last 168 hours. No results for input(s): LIPASE, AMYLASE in the last 168 hours. No results for input(s): AMMONIA in the last 168 hours.  Coagulation Profile: No results for input(s): INR, PROTIME in the last 168 hours.  Cardiac Enzymes: No results for input(s): CKTOTAL, CKMB, CKMBINDEX, TROPONINI in the last 168 hours.  BNP (last 3 results) No results for input(s): PROBNP in the last 8760 hours.  HbA1C: No results for input(s): HGBA1C in the last 72 hours.  CBG: No results for input(s): GLUCAP in the last 168 hours.  Lipid Profile: No results for input(s): CHOL, HDL, LDLCALC, TRIG, CHOLHDL, LDLDIRECT in the last 72 hours.  Thyroid Function Tests: No results for input(s): TSH, T4TOTAL, FREET4, T3FREE, THYROIDAB in the last 72 hours.  Anemia Panel: No results for input(s): VITAMINB12, FOLATE, FERRITIN, TIBC, IRON, RETICCTPCT in the last 72 hours.  Urine analysis:    Component Value Date/Time   COLORURINE YELLOW 06/24/2016 0457   APPEARANCEUR CLOUDY (A) 06/24/2016 0457   LABSPEC 1.039 (H) 06/24/2016 0457   PHURINE 5.0 06/24/2016 0457   GLUCOSEU NEGATIVE 06/24/2016 0457   HGBUR NEGATIVE 06/24/2016 0457   BILIRUBINUR NEGATIVE 06/24/2016 0457   BILIRUBINUR neg 05/26/2016 1008   KETONESUR NEGATIVE 06/24/2016 0457   PROTEINUR NEGATIVE 06/24/2016 0457   UROBILINOGEN 0.2 05/26/2016 1008   UROBILINOGEN 1.0 03/22/2014 0805   NITRITE NEGATIVE 06/24/2016 0457   LEUKOCYTESUR NEGATIVE 06/24/2016 0457    Sepsis Labs: Lactic Acid, Venous    Component Value Date/Time   LATICACIDVEN 1.4 02/08/2013 0842    MICROBIOLOGY: No results found for this or any previous visit (from the past 240  hour(s)).  RADIOLOGY STUDIES/RESULTS: Dg Chest Portable 1 View  Result Date: 06/24/2016 CLINICAL DATA:  Dyspnea.  Recent ICU admission. EXAM: PORTABLE CHEST 1 VIEW COMPARISON:  05/12/2016 FINDINGS: A single AP portable view of the chest demonstrates no focal airspace consolidation or alveolar edema. The lungs are grossly clear. There is no large effusion or pneumothorax. Cardiac and mediastinal contours appear unremarkable. IMPRESSION: No active disease. Electronically Signed   By: Ellery Plunk M.D.   On: 06/24/2016 00:15     LOS: 1 day   Jeoffrey Massed, MD  [facility] Pager:[phone]  If 7PM-7AM, please contact night-coverage [email or web address] Password [email or web address] 06/24/2016, 10:48 AM

## 2016-06-24 NOTE — Progress Notes (Signed)
RT NOTE:  Pt transported to 3S without event. Report given to Brittney, RT.

## 2016-06-24 NOTE — H&P (Addendum)
History and Physical    Kathleen Rodriguez ZOX:[id number] DOB: 1966-02-01 DOA: 06/23/2016  Referring MD/NP/PA: Dr. Nicanor Alcon PCP: [name], MD  Patient coming from: home   Chief Complaint: Shortness of breath  HPI: Kathleen Rodriguez is a 50 y.o. female with medical history significant of poorly controlled asthma, history of intubation, OSA on CPAP, and tobacco abuse; who presents with complaints of shortness of breath. Symptoms started approximately 2 days ago with complaints of tightness in her chest. She tried using home inhalers without relief of symptoms. Symptoms progressively worsening patient reported associated symptoms of productive cough with clear sputum, wheezing, lightheadedness, headache, lower abdominal pain, and constipation. Denies any loss of consciousness, hemoptysis, diarrhea, nausea, or dysuria. She notes that her son and nephew had recently been sick earlier in the week with a cold before the onset of her symptoms. Patient had just been recently hospitalized for similar symptoms from size 7/20- 7/23 with acute asthma exacerbation. Patient previously had been evaluated by Dr. Craige Cotta, and it appears that last been seen in pulmonology office on 03/09/2016. She was actually supposed to see Dr. Craige Cotta in the office yesterday, however was unable to make the appointment secondary to her symptoms. She denies any recent tobacco abuse or illicit drug use.  ED Course: Upon admission into the emergency department patient was evaluated and seen to have increased work of breathing having being given Solu-Medrol, albuterol, and Atrovent nebulized treatments without relief of symptoms.  She was placed on BiPAP. On subsequent reevaluation patient was noted to be somewhat improved. Lab work  revealed WBC 11, ionized calcium of 1.07, troponin 0, BNP 24, and all other lab work was relatively within normal limits. EKG revealed non ischemic changes.  Review of Systems: As per HPI otherwise 10 point review of  systems negative.   Past Medical History:  Diagnosis Date  . Arthritis    "knees, lower back; legs, ankles" (01/27/2016)  . Asthma   . CHF (congestive heart failure) (HCC) 2016   "when I went into a coma"  . Cocaine abuse   . 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 GERD (gastroesophageal reflux disease)   . Hypertension    "doctor took me off RX in 2016" (01/27/2016)  . Influenza B April 2014   Complicated by multi-organ failure  . OSA on CPAP "since " 03/20/2013  . Pneumonia 2016  . Required emergent intubation    asthma exacerbation in 2016  . Tobacco abuse     Past Surgical History:  Procedure Laterality Date  . CESAREAN SECTION  2006  . LACERATION REPAIR Right ~ 1997   "tried to cut myself"  . TUBAL LIGATION  2006     reports that she has not picked up a cigarette in over a few months..  She has a 5.00 pack-year smoking history. She has never used smokeless tobacco. She reports that she does not drink alcohol or use drugs.  Allergies  Allergen Reactions  . Latex Itching and Rash  . Other Hives and Itching    Ketchup  . Tomato Hives and Itching  . Wool Alcohol [Lanolin] Itching    Family History  Problem Relation Age of Onset  . Hypertension Mother   . HIV/AIDS Father     Prior to Admission medications   Medication Sig Start Date End Date Taking? Authorizing Provider  acetaminophen (TYLENOL) 500 MG tablet Take 1,000 mg by mouth every 6 (six) hours as needed for moderate pain.    Historical Provider, MD  albuterol (  PROVENTIL) (2.5 MG/3ML) 0.083% nebulizer solution Take 3 mLs (2.5 mg total) by nebulization every 2 (two) hours as needed for wheezing or shortness of breath. 03/09/16   Tammy S Parrett, NP  albuterol (VENTOLIN HFA) 108 (90 Base) MCG/ACT inhaler Inhale 2 puffs into the lungs every 6 (six) hours as needed for wheezing. 03/26/16   Charlynne Panderavid Hsienta Yao, MD  azithromycin (ZITHROMAX) 250 MG tablet Take one tab by mouth orally daily for the next Patient not taking:  Reported on 05/26/2016 05/15/16   Penny Piarlando Vega, MD  budesonide (PULMICORT) 0.25 MG/2ML nebulizer solution Take 2 mLs (0.25 mg total) by nebulization 2 (two) times daily. Patient not taking: Reported on 05/26/2016 03/09/16   Virgel Bouquetammy S Parrett, NP  cloNIDine (CATAPRES) 0.1 MG tablet Take 1 tablet (0.1 mg total) by mouth at bedtime. For hot flashes 05/26/16   Jaclyn ShaggyEnobong Amao, MD  famotidine (PEPCID) 20 MG tablet Take 1 tablet (20 mg total) by mouth at bedtime. Patient not taking: Reported on 05/12/2016 01/28/16   Maretta BeesShanker M Ghimire, MD  fluticasone [facility](FLONASE) 50 MCG/ACT nasal spray Place 2 sprays into the nose daily. 02/21/13   Bernadene PersonKathryn A Whiteheart, NP  guaiFENesin (MUCINEX) 600 MG 12 hr tablet Take 1 tablet (600 mg total) by mouth 2 (two) times daily. 01/28/16   Shanker Levora DredgeM Ghimire, MD  metroNIDAZOLE (METROGEL VAGINAL) 0.75 % vaginal gel Place 1 Applicatorful vaginally at bedtime. 05/26/16   Jaclyn ShaggyEnobong Amao, MD  naproxen (NAPROSYN) 500 MG tablet Take 1 tablet (500 mg total) by mouth 2 (two) times daily with a meal. Patient not taking: Reported on 05/12/2016 03/09/16   Jaclyn ShaggyEnobong Amao, MD  predniSONE (DELTASONE) 20 MG tablet Take 60 mg daily x 2 days, 40 mg daily x 2 days then 20 mg daily x 2 days Patient not taking: Reported on 05/26/2016 05/15/16   Penny Piarlando Vega, MD    Physical Exam:  Constitutional: Obese female in NAD, currently on BiPAP Vitals:   06/24/16 0030 06/24/16 0036 06/24/16 0100 06/24/16 0115  BP: 143/96 143/91 141/86 154/72  Pulse: 85  94 99  Resp: 17 19 16 16   Temp:      TempSrc:      SpO2: 100% 100% 99% 100%   Eyes: PERRL, lids and conjunctivae normal ENMT: Mucous membranes are moist. Posterior pharynx clear of any exudate or lesions.Normal dentition.  Neck: normal, supple, no masses, no thyromegaly Respiratory:  decreased air movement bilaterally and bilateral wheezing. On BiPAP currently with normal respiratory rate at this time no accessory muscle usage noted. Cardiovascular: Regular rate and rhythm, no  murmurs / rubs / gallops. No extremity edema. 2+ pedal pulses. No carotid bruits.  Abdomen: no tenderness, no masses palpated. No hepatosplenomegaly. Bowel sounds positive.  Musculoskeletal: no clubbing / cyanosis. No joint deformity upper and lower extremities. Good ROM, no contractures. Normal muscle tone.  Skin: no rashes, lesions, ulcers. No induration Neurologic: CN 2-12 grossly intact. Sensation intact, DTR normal. Strength 5/5 in all 4.  Psychiatric: Normal judgment and insight. Alert and oriented x 3. Normal mood.     Labs on Admission: I have personally reviewed following labs and imaging studies  CBC:  Recent Labs Lab 06/23/16 2359 06/24/16 0012  WBC 11.0*  --   NEUTROABS 5.8  --   HGB 12.6 14.3  HCT 39.7 42.0  MCV 86.5  --   PLT 279  --    Basic Metabolic Panel:  Recent Labs Lab 06/24/16 0012  NA 142  K 3.6  CL 107  GLUCOSE  119*  BUN 17  CREATININE 0.80   GFR: CrCl cannot be calculated (Unknown ideal weight.). Liver Function Tests: No results for input(s): AST, ALT, ALKPHOS, BILITOT, PROT, ALBUMIN in the last 168 hours. No results for input(s): LIPASE, AMYLASE in the last 168 hours. No results for input(s): AMMONIA in the last 168 hours. Coagulation Profile: No results for input(s): INR, PROTIME in the last 168 hours. Cardiac Enzymes: No results for input(s): CKTOTAL, CKMB, CKMBINDEX, TROPONINI in the last 168 hours. BNP (last 3 results) No results for input(s): PROBNP in the last 8760 hours. HbA1C: No results for input(s): HGBA1C in the last 72 hours. CBG: No results for input(s): GLUCAP in the last 168 hours. Lipid Profile: No results for input(s): CHOL, HDL, LDLCALC, TRIG, CHOLHDL, LDLDIRECT in the last 72 hours. Thyroid Function Tests: No results for input(s): TSH, T4TOTAL, FREET4, T3FREE, THYROIDAB in the last 72 hours. Anemia Panel: No results for input(s): VITAMINB12, FOLATE, FERRITIN, TIBC, IRON, RETICCTPCT in the last 72 hours. Urine  analysis:    Component Value Date/Time   COLORURINE YELLOW 05/13/2016 0030   APPEARANCEUR CLEAR 05/13/2016 0030   LABSPEC 1.031 (H) 05/13/2016 0030   PHURINE 5.0 05/13/2016 0030   GLUCOSEU >1000 (A) 05/13/2016 0030   HGBUR NEGATIVE 05/13/2016 0030   BILIRUBINUR neg 05/26/2016 1008   KETONESUR NEGATIVE 05/13/2016 0030   PROTEINUR 30 05/26/2016 1008   PROTEINUR NEGATIVE 05/13/2016 0030   UROBILINOGEN 0.2 05/26/2016 1008   UROBILINOGEN 1.0 03/22/2014 0805   NITRITE neg 05/26/2016 1008   NITRITE NEGATIVE 05/13/2016 0030   LEUKOCYTESUR Negative 05/26/2016 1008   Sepsis Labs: No results found for this or any previous visit (from the past 240 hour(s)).   Radiological Exams on Admission: Dg Chest Portable 1 View  Result Date: 06/24/2016 CLINICAL DATA:  Dyspnea.  Recent ICU admission. EXAM: PORTABLE CHEST 1 VIEW COMPARISON:  05/12/2016 FINDINGS: A single AP portable view of the chest demonstrates no focal airspace consolidation or alveolar edema. The lungs are grossly clear. There is no large effusion or pneumothorax. Cardiac and mediastinal contours appear unremarkable. IMPRESSION: No active disease. Electronically Signed   By: Ellery Plunk M.D.   On: 06/24/2016 00:15    EKG: Independently reviewed. Sinus rhythm with short PR interval  Assessment/Plan Acute respiratory failure with acute asthma exacerbation: Patient presents with productive cough with clear sputum, shortness of breath, and wheezing for last 2 days. Initially in significant respiratory distress placed to BiPAP, given breathing treatments, Solu-Medrol, and magnesium sulfate with improvement of symptoms - Admit to stepdown - Continue BiPAP for now, and weaned as tolerated - Solu-Medrol 40 mg po q daily - DuoNeb's scheduled 6 hours - Budesonide neb scheduled twice a day  - Mucinex  Leukocytosis: WBC 11 noted on admission. Patient was noted to still be taking steroid taper. - f/u urinalysis - Continue to monitor  repeat CBC in a.m.  Chest pain/pressure: Acute. Suspect related to patient's acute asthma exacerbation. Initial EKG and troponin reassuring. - Will repeat troponin  Lower abdominal pain: Patient reports history of intermittent constipation as bowel movement yesterday. - Checking urinalysis  - A want to add on stool softener   Hypocalcemia: Ionized calcium 1.07 on admission. - Calcium gluconate 1 g IV 1 dose now - Continue to monitor and replace as needed  History of Tobacco and cocaine abuse: Patient denies any recent tobacco abuse or cocaine use. - check UDS  GERD - Continue Pepcid    DVT ppx: SQ Lovenox Code Status: Full code  Family Communication: None at bed side.  Disposition Plan:  Anticipate discharge back to previous home environment Consults called:  none Admission status:  SDU/obs  Clydie Braun MD [facility] Pager [phone]  If 7PM-7AM, please contact night-coverage [email or web address] Password [email or web address]  06/24/2016, 2:02 AM

## 2016-06-25 DIAGNOSIS — J9601 Acute respiratory failure with hypoxia: Secondary | ICD-10-CM

## 2016-06-25 LAB — BASIC METABOLIC PANEL
Anion gap: 7 (ref 5–15)
BUN: 13 mg/dL (ref 6–20)
CO2: 27 mmol/L (ref 22–32)
Calcium: 9 mg/dL (ref 8.9–10.3)
Chloride: 106 mmol/L (ref 101–111)
Creatinine, Ser: 0.81 mg/dL (ref 0.44–1.00)
Glucose, Bld: 142 mg/dL — ABNORMAL HIGH (ref 65–99)
POTASSIUM: 4.3 mmol/L (ref 3.5–5.1)
SODIUM: 140 mmol/L (ref 135–145)

## 2016-06-25 LAB — CBC
HCT: 39.8 % (ref 36.0–46.0)
Hemoglobin: 12.4 g/dL (ref 12.0–15.0)
MCH: 27.6 pg (ref 26.0–34.0)
MCHC: 31.2 g/dL (ref 30.0–36.0)
MCV: 88.4 fL (ref 78.0–100.0)
PLATELETS: 290 10*3/uL (ref 150–400)
RBC: 4.5 MIL/uL (ref 3.87–5.11)
RDW: 17 % — ABNORMAL HIGH (ref 11.5–15.5)
WBC: 16.8 10*3/uL — AB (ref 4.0–10.5)

## 2016-06-25 MED ORDER — TRAMADOL HCL 50 MG PO TABS
50.0000 mg | ORAL_TABLET | Freq: Four times a day (QID) | ORAL | Status: DC | PRN
  Administered 2016-06-25: 50 mg via ORAL
  Filled 2016-06-25: qty 1

## 2016-06-25 MED ORDER — METHYLPREDNISOLONE SODIUM SUCC 40 MG IJ SOLR
40.0000 mg | Freq: Three times a day (TID) | INTRAMUSCULAR | Status: DC
  Administered 2016-06-25 – 2016-06-26 (×3): 40 mg via INTRAVENOUS
  Filled 2016-06-25 (×3): qty 1

## 2016-06-25 NOTE — Progress Notes (Signed)
Pt arrived from 3S via wheelchair. A & O x4. Independent, Low fall risk. Telemetry box 25. Pt oriented to room with only needs expressed is tooth pain on right side of mouth. MD paged about getting some pain meds. Will continue to follow as needed.

## 2016-06-25 NOTE — Progress Notes (Signed)
PROGRESS NOTE        PATIENT DETAILS Name: Kathleen AbideLouise Rodriguez Age: 50 y.o. Sex: female Date of Birth: 04/18/1966 Admit Date: 06/23/2016 Admitting Physician Clydie Braunondell A Smith, MD ZOX:[id number]:[name], Venetia NightAmao, MD  Brief Narrative: Patient is a 50 y.o. female with history of asthma Amma OSA on CPAP, ongoing tobacco abuse presented with acute hypoxemic respiratory failure requiring BiPAP on admission secondary to asthma exacerbation. Admitted and started on steroids, bronchodilators with significant improvement, liberated off BiPAP and has been transitioned to nasal cannula.  See below for further details.  Subjective: Feels much better, no longer feels tight. She seems much more comfortable than yesterday.  Assessment/Plan: Active Problems: Acute hypoxemic respiratory failure: Secondary to asthma exacerbation, required BiPAP on admission. She has been liberated off BiPAP, and is now on nasal cannula. Continue treatment for asthma exacerbation.  Asthma exacerbation: Much improved, significantly less wheezing compared to yesterday. Decrease Solu-Medrol him a continue bronchodilators and empiric doxycycline. We will continue anti-sinus tx with Claritin/Flonase/Afrin-as she still has some amount of nasal congestion and postnasal drip . Will transfer out of stepdown unit today. Suspect that if clinical improvement continues, she should be ready to go home tomorrow  Leukocytosis: Secondary to steroids, no signs of infection. On empiric doxycycline  GERD: Continue PPI  Prior history of cocaine/tobacco abuse: Denies any current ongoing polysubstance use. Urine drug screen on admission negative, however on 03/26/16-urine drug screen positive for cocaine.  DVT Prophylaxis: Prophylactic Lovenox  Code Status: Full code   Family Communication: None at bedside  Disposition Plan: Remain inpatient-transfer to med surg unit-home like on 9/3 if clinical improvement continues  Antimicrobial  agents: Doxycycline 9/1>>  Procedures: None  CONSULTS:  None  Time spent: 25 minutes-Greater than 50% of this time was spent in counseling, explanation of diagnosis, planning of further management, and coordination of care.  MEDICATIONS: Anti-infectives    Start     Dose/Rate Route Frequency Ordered Stop   06/24/16 1000  doxycycline (VIBRA-TABS) tablet 100 mg     100 mg Oral Every 12 hours 06/24/16 0755        Scheduled Meds: . budesonide  0.25 mg Nebulization BID  . doxycycline  100 mg Oral Q12H  . enoxaparin (LOVENOX) injection  40 mg Subcutaneous Daily  . fluticasone  2 spray Each Nare Daily  . guaiFENesin  600 mg Oral BID  . ipratropium-albuterol  3 mL Nebulization QID  . loratadine  10 mg Oral Daily  . methylPREDNISolone (SOLU-MEDROL) injection  60 mg Intravenous Q8H  . oxymetazoline  1 spray Each Nare BID  . pantoprazole  40 mg Oral Q1200   Continuous Infusions:  PRN Meds:.acetaminophen **OR** acetaminophen, albuterol, cloNIDine, ondansetron **OR** ondansetron (ZOFRAN) IV   PHYSICAL EXAM: Vital signs: Vitals:   06/25/16 0011 06/25/16 0747 06/25/16 0750 06/25/16 0753  BP: (!) 158/86 104/71 104/71   Pulse: 81 74 76   Resp: 14 19 18    Temp: 97.6 F (36.4 C) 98.9 F (37.2 C)    TempSrc: Axillary Oral    SpO2: 100% 97% 95% 95%  Weight:      Height:       Filed Weights   06/24/16 0334  Weight: 124.9 kg (275 lb 5.7 oz)   Body mass index is 44.44 kg/m.   Gen Exam: Awake and alert with clear speech. Not in any distress  Neck: Supple,  No JVD.   Chest: MovingAir well- Still with a few scattered rhonchi-but markedly improved compared to yesterday CVS: S1 S2 Regular, no murmurs.  Abdomen: soft, BS +, non tender, non distended.  Extremities: no edema, lower extremities warm to touch. Neurologic: Non Focal.   Skin: No Rash or lesions  Wounds: N/A.    I have personally reviewed following labs and imaging studies  LABORATORY DATA: CBC:  Recent Labs Lab  06/23/16 2359 06/24/16 0012 06/25/16 0433  WBC 11.0*  --  16.8*  NEUTROABS 5.8  --   --   HGB 12.6 14.3 12.4  HCT 39.7 42.0 39.8  MCV 86.5  --  88.4  PLT 279  --  290    Basic Metabolic Panel:  Recent Labs Lab 06/24/16 0012 06/25/16 0433  NA 142 140  K 3.6 4.3  CL 107 106  CO2  --  27  GLUCOSE 119* 142*  BUN 17 13  CREATININE 0.80 0.81  CALCIUM  --  9.0    GFR: Estimated Creatinine Clearance: 112.2 mL/min (by C-G formula based on SCr of 0.81 mg/dL).  Liver Function Tests: No results for input(s): AST, ALT, ALKPHOS, BILITOT, PROT, ALBUMIN in the last 168 hours. No results for input(s): LIPASE, AMYLASE in the last 168 hours. No results for input(s): AMMONIA in the last 168 hours.  Coagulation Profile: No results for input(s): INR, PROTIME in the last 168 hours.  Cardiac Enzymes: No results for input(s): CKTOTAL, CKMB, CKMBINDEX, TROPONINI in the last 168 hours.  BNP (last 3 results) No results for input(s): PROBNP in the last 8760 hours.  HbA1C: No results for input(s): HGBA1C in the last 72 hours.  CBG: No results for input(s): GLUCAP in the last 168 hours.  Lipid Profile: No results for input(s): CHOL, HDL, LDLCALC, TRIG, CHOLHDL, LDLDIRECT in the last 72 hours.  Thyroid Function Tests: No results for input(s): TSH, T4TOTAL, FREET4, T3FREE, THYROIDAB in the last 72 hours.  Anemia Panel: No results for input(s): VITAMINB12, FOLATE, FERRITIN, TIBC, IRON, RETICCTPCT in the last 72 hours.  Urine analysis:    Component Value Date/Time   COLORURINE YELLOW 06/24/2016 0457   APPEARANCEUR CLOUDY (A) 06/24/2016 0457   LABSPEC 1.039 (H) 06/24/2016 0457   PHURINE 5.0 06/24/2016 0457   GLUCOSEU NEGATIVE 06/24/2016 0457   HGBUR NEGATIVE 06/24/2016 0457   BILIRUBINUR NEGATIVE 06/24/2016 0457   BILIRUBINUR neg 05/26/2016 1008   KETONESUR NEGATIVE 06/24/2016 0457   PROTEINUR NEGATIVE 06/24/2016 0457   UROBILINOGEN 0.2 05/26/2016 1008   UROBILINOGEN 1.0  03/22/2014 0805   NITRITE NEGATIVE 06/24/2016 0457   LEUKOCYTESUR NEGATIVE 06/24/2016 0457    Sepsis Labs: Lactic Acid, Venous    Component Value Date/Time   LATICACIDVEN 1.4 02/08/2013 0842    MICROBIOLOGY: No results found for this or any previous visit (from the past 240 hour(s)).  RADIOLOGY STUDIES/RESULTS: Dg Chest Portable 1 View  Result Date: 06/24/2016 CLINICAL DATA:  Dyspnea.  Recent ICU admission. EXAM: PORTABLE CHEST 1 VIEW COMPARISON:  05/12/2016 FINDINGS: A single AP portable view of the chest demonstrates no focal airspace consolidation or alveolar edema. The lungs are grossly clear. There is no large effusion or pneumothorax. Cardiac and mediastinal contours appear unremarkable. IMPRESSION: No active disease. Electronically Signed   By: Ellery Plunk M.D.   On: 06/24/2016 00:15     LOS: 1 day   Jeoffrey Massed, MD  [facility] Pager:[phone]  If 7PM-7AM, please contact night-coverage [email or web address] Password [email or web address] 06/25/2016, 10:05 AM

## 2016-06-25 NOTE — Progress Notes (Signed)
Report called to 5W. All questions answered. Pt to transferred with no monitor.

## 2016-06-26 MED ORDER — DOXYCYCLINE HYCLATE 100 MG PO TABS: 100.0000 mg | ORAL_TABLET | Freq: Two times a day (BID) | ORAL | 0 refills | Status: DC

## 2016-06-26 MED ORDER — MONTELUKAST SODIUM 10 MG PO TABS: 10.0000 mg | ORAL_TABLET | Freq: Every day | ORAL | 0 refills | Status: DC

## 2016-06-26 MED ORDER — FLUCONAZOLE 100 MG PO TABS
100.0000 mg | ORAL_TABLET | Freq: Once | ORAL | Status: AC
  Administered 2016-06-26: 100 mg via ORAL
  Filled 2016-06-26: qty 1

## 2016-06-26 MED ORDER — PANTOPRAZOLE SODIUM 40 MG PO TBEC: 40.0000 mg | DELAYED_RELEASE_TABLET | Freq: Every day | ORAL | 0 refills | Status: DC

## 2016-06-26 MED ORDER — FLUCONAZOLE 100 MG PO TABS
150.0000 mg | ORAL_TABLET | Freq: Every day | ORAL | Status: DC
  Filled 2016-06-26: qty 2

## 2016-06-26 MED ORDER — MONTELUKAST SODIUM 10 MG PO TABS
10.0000 mg | ORAL_TABLET | Freq: Every day | ORAL | Status: DC
  Filled 2016-06-26: qty 1

## 2016-06-26 MED ORDER — PREDNISONE 5 MG PO TABS: ORAL_TABLET | ORAL | 0 refills | Status: DC

## 2016-06-26 NOTE — Discharge Summary (Signed)
Kathleen Rodriguez ZOX:[id number] DOB: 10/30/1965 DOA: 06/23/2016  PCP: [name], MD  Admit date: 06/23/2016  Discharge date: 06/26/2016  Admitted From: home   Disposition:  Home   Recommendations for Outpatient Follow-up:   Follow up with PCP in 1-2 weeks  PCP Please obtain BMP/CBC, 2 view CXR in 1week,  (see Discharge instructions)   PCP Please follow up on the following pending results: None   Home Health: None   Equipment/Devices: None  Consultations: None Discharge Condition: Stable   CODE STATUS: Full   Diet Recommendation: Heart Healthy   Chief Complaint  Patient presents with  . Respiratory Distress     Brief history of present illness from the day of admission and additional interim summary    Patient is a 50 y.o. female with history of asthma Amma OSA on CPAP, ongoing tobacco abuse presented with acute hypoxemic respiratory failure requiring BiPAP on admission secondary to asthma exacerbation. Admitted and started on steroids, bronchodilators with significant improvement, liberated off BiPAP and has been transitioned to nasal cannula.  See below for further details  Hospital issues addressed    Acute hypoxic respiratory failure due to acute on chronic asthma exacerbation. Much improved after IV steroids, empiric doxycycline, breathing treatments, this morning minimal wheezing, no distress noted, patient feels close to baseline eager to go home, will be placed on oral steroid taper, add Singulair, continue home regimen of breathing treatments, doxycycline for 4 more days, follow with PCP and primary pulmonologist within a week. She has a pending appointment with her primary pulmonologist and has been requested to see him within a week.  GERD. Placed on PPI.  History of smoking and cocaine abuse.  Counseled to quit all, last cocaine use was on 03/26/2016 per her drug screen.  Obesity with obstructive sleep apnea. Follow with PCP for weight loss, continue BiPAP at night.  Reactionary leukocytosis. Due to steroids. Chest x-ray unremarkable, afebrile, repeat CBC in 7-10 days.  Discharge diagnosis     Active Problems:   Chest pain   Asthma exacerbation   GERD (gastroesophageal reflux disease)   Acute respiratory failure (HCC)    Discharge instructions    Discharge Instructions    Diet - low sodium heart healthy    Complete by:  As directed   Discharge instructions    Complete by:  As directed   Follow with Primary MD [name], MD and your Lung doctor in 7 days   Get CBC, CMP, 2 view Chest X ray checked  by Primary MD or SNF MD in 5-7 days ( we routinely change or add medications that can affect your baseline labs and fluid status, therefore we recommend that you get the mentioned basic workup next visit with your PCP, your PCP may decide not to get them or add new tests based on their clinical decision)   Activity: As tolerated with Full fall precautions use walker/cane & assistance as needed   Disposition Home     Diet:   Heart Healthy    For  Heart failure patients - Check your Weight same time everyday, if you gain over 2 pounds, or you develop in leg swelling, experience more shortness of breath or chest pain, call your Primary MD immediately. Follow Cardiac Low Salt Diet and 1.5 lit/day fluid restriction.   On your next visit with your primary care physician please Get Medicines reviewed and adjusted.   Please request your Prim.MD to go over all Hospital Tests and Procedure/Radiological results at the follow up, please get all Hospital records sent to your Prim MD by signing hospital release before you go home.   If you experience worsening of your admission symptoms, develop shortness of breath, life threatening emergency, suicidal or homicidal thoughts you  must seek medical attention immediately by calling 911 or calling your MD immediately  if symptoms less severe.  You Must read complete instructions/literature along with all the possible adverse reactions/side effects for all the Medicines you take and that have been prescribed to you. Take any new Medicines after you have completely understood and accpet all the possible adverse reactions/side effects.   Do not drive, operate heavy machinery, perform activities at heights, swimming or participation in water activities or provide baby sitting services if your were admitted for syncope or siezures until you have seen by Primary MD or a Neurologist and advised to do so again.  Do not drive when taking Pain medications.    Do not take more than prescribed Pain, Sleep and Anxiety Medications  Special Instructions: If you have smoked or chewed Tobacco  in the last 2 yrs please stop smoking, stop any regular Alcohol  and or any Recreational drug use.  Wear Seat belts while driving.   Please note  You were cared for by a hospitalist during your hospital stay. If you have any questions about your discharge medications or the care you received while you were in the hospital after you are discharged, you can call the unit and asked to speak with the hospitalist on call if the hospitalist that took care of you is not available. Once you are discharged, your primary care physician will handle any further medical issues. Please note that NO REFILLS for any discharge medications will be authorized once you are discharged, as it is imperative that you return to your primary care physician (or establish a relationship with a primary care physician if you do not have one) for your aftercare needs so that they can reassess your need for medications and monitor your lab values.   Increase activity slowly    Complete by:  As directed      Discharge Medications     Medication List    TAKE these medications     acetaminophen 500 MG tablet Commonly known as:  TYLENOL Take 1,000 mg by mouth every 6 (six) hours as needed for moderate pain.   albuterol (2.5 MG/3ML) 0.083% nebulizer solution Commonly known as:  PROVENTIL Take 3 mLs (2.5 mg total) by nebulization every 2 (two) hours as needed for wheezing or shortness of breath.   albuterol 108 (90 Base) MCG/ACT inhaler Commonly known as:  VENTOLIN HFA Inhale 2 puffs into the lungs every 6 (six) hours as needed for wheezing.   budesonide 0.25 MG/2ML nebulizer solution Commonly known as:  PULMICORT Take 2 mLs (0.25 mg total) by nebulization 2 (two) times daily. What changed:  when to take this  reasons to take this   cloNIDine 0.1 MG tablet Commonly known as:  CATAPRES Take 1 tablet (0.1  mg total) by mouth at bedtime. For hot flashes   doxycycline 100 MG tablet Commonly known as:  VIBRA-TABS Take 1 tablet (100 mg total) by mouth every 12 (twelve) hours.   fluticasone 50 MCG/ACT nasal spray Commonly known as:  FLONASE Place 2 sprays into the nose daily. What changed:  when to take this  reasons to take this   montelukast 10 MG tablet Commonly known as:  SINGULAIR Take 1 tablet (10 mg total) by mouth at bedtime.   pantoprazole 40 MG tablet Commonly known as:  PROTONIX Take 1 tablet (40 mg total) by mouth daily at 12 noon.   predniSONE 5 MG tablet Commonly known as:  DELTASONE Label  & dispense according to the schedule below. 10 Pills PO for 3 days then, 8 Pills PO for 3 days, 6 Pills PO for 3 days, 4 Pills PO for 3 days, 2 Pills PO for 3 days, 1 Pills PO for 3 days, 1/2 Pill  PO for 3 days then STOP. Total 95 pills.       Follow-up Information    [facility] Follow up on 06/28/2016.   Why:  3 pm for hospital follow up Contact information: [address] [address] [phone]          Major procedures and Radiology Reports - PLEASE review detailed and  final reports thoroughly  -        Dg Chest Portable 1 View  Result Date: 06/24/2016 CLINICAL DATA:  Dyspnea.  Recent ICU admission. EXAM: PORTABLE CHEST 1 VIEW COMPARISON:  05/12/2016 FINDINGS: A single AP portable view of the chest demonstrates no focal airspace consolidation or alveolar edema. The lungs are grossly clear. There is no large effusion or pneumothorax. Cardiac and mediastinal contours appear unremarkable. IMPRESSION: No active disease. Electronically Signed   By: Ellery Plunk M.D.   On: 06/24/2016 00:15    Micro Results     No results found for this or any previous visit (from the past 240 hour(s)).  Today   Subjective    Kathleen Rodriguez today has no headache,no chest abdominal pain,no new weakness tingling or numbness, feels much better wants to go home today    Objective   Blood pressure (!) 143/82, pulse 81, temperature 98 F (36.7 C), temperature source Oral, resp. rate 18, height 5\' 6"  (1.676 m), weight 124.9 kg (275 lb 5.7 oz), SpO2 96 %.   Intake/Output Summary (Last 24 hours) at 06/26/16 0841 Last data filed at 06/26/16 0753  Gross per 24 hour  Intake                0 ml  Output              450 ml  Net             -450 ml    Exam Awake Alert, Oriented x 3, No new F.N deficits, Normal affect [place].AT,PERRAL Supple Neck,No JVD, No cervical lymphadenopathy appriciated.  Symmetrical Chest wall movement, Good air movement bilaterally, minimal wheezing RRR,No Gallops,Rubs or new Murmurs, No Parasternal Heave +ve B.Sounds, Abd Soft, Non tender, No organomegaly appriciated, No rebound -guarding or rigidity. No Cyanosis, Clubbing or edema, No new Rash or bruise   Data Review   CBC w Diff: Lab Results  Component Value Date   WBC 16.8 (H) 06/25/2016   HGB 12.4 06/25/2016   HCT 39.8 06/25/2016   PLT 290 06/25/2016   LYMPHOPCT 36 06/23/2016  MONOPCT 11 06/23/2016   EOSPCT 1 06/23/2016   BASOPCT 0 06/23/2016    CMP: Lab Results  Component Value  Date   NA 140 06/25/2016   K 4.3 06/25/2016   CL 106 06/25/2016   CO2 27 06/25/2016   BUN 13 06/25/2016   CREATININE 0.81 06/25/2016   PROT 7.4 05/13/2016   ALBUMIN 3.9 05/13/2016   BILITOT 0.5 05/13/2016   ALKPHOS 56 05/13/2016   AST 20 05/13/2016   ALT 13 (L) 05/13/2016  .   Total Time in preparing paper work, data evaluation and todays exam - 35 minutes  Leroy Sea M.D on 06/26/2016 at 8:41 AM  [facility]   Office  [phone]

## 2016-06-26 NOTE — Progress Notes (Signed)
Nsg Discharge Note  Admit Date:  06/23/2016 Discharge date: 06/26/2016   Kathleen Rodriguez to be D/C'd Home per MD order.  AVS completed.  Copy for chart, and copy for patient signed, and dated. Patient/caregiver able to verbalize understanding.  Discharge Medication:   Medication List    TAKE these medications   acetaminophen 500 MG tablet Commonly known as:  TYLENOL Take 1,000 mg by mouth every 6 (six) hours as needed for moderate pain.   albuterol (2.5 MG/3ML) 0.083% nebulizer solution Commonly known as:  PROVENTIL Take 3 mLs (2.5 mg total) by nebulization every 2 (two) hours as needed for wheezing or shortness of breath.   albuterol 108 (90 Base) MCG/ACT inhaler Commonly known as:  VENTOLIN HFA Inhale 2 puffs into the lungs every 6 (six) hours as needed for wheezing.   budesonide 0.25 MG/2ML nebulizer solution Commonly known as:  PULMICORT Take 2 mLs (0.25 mg total) by nebulization 2 (two) times daily. What changed:  when to take this  reasons to take this   cloNIDine 0.1 MG tablet Commonly known as:  CATAPRES Take 1 tablet (0.1 mg total) by mouth at bedtime. For hot flashes   doxycycline 100 MG tablet Commonly known as:  VIBRA-TABS Take 1 tablet (100 mg total) by mouth every 12 (twelve) hours.   fluticasone 50 MCG/ACT nasal spray Commonly known as:  FLONASE Place 2 sprays into the nose daily. What changed:  when to take this  reasons to take this   montelukast 10 MG tablet Commonly known as:  SINGULAIR Take 1 tablet (10 mg total) by mouth at bedtime.   pantoprazole 40 MG tablet Commonly known as:  PROTONIX Take 1 tablet (40 mg total) by mouth daily at 12 noon.   predniSONE 5 MG tablet Commonly known as:  DELTASONE Label  & dispense according to the schedule below. 10 Pills PO for 3 days then, 8 Pills PO for 3 days, 6 Pills PO for 3 days, 4 Pills PO for 3 days, 2 Pills PO for 3 days, 1 Pills PO for 3 days, 1/2 Pill  PO for 3 days then STOP. Total 95 pills.        Discharge Assessment: Vitals:   06/25/16 2220 06/26/16 0602  BP:  (!) 143/82  Pulse:  81  Resp: 18 18  Temp:  98 F (36.7 C)   Skin clean, dry and intact without evidence of skin break down, no evidence of skin tears noted. IV catheter discontinued intact. Site without signs and symptoms of complications - no redness or edema noted at insertion site, patient denies c/o pain - only slight tenderness at site.  Dressing with slight pressure applied.  D/c Instructions-Education: Discharge instructions given to patient/family with verbalized understanding. D/c education completed with patient/family including follow up instructions, medication list, d/c activities limitations if indicated, with other d/c instructions as indicated by MD - patient able to verbalize understanding, all questions fully answered. Patient instructed to return to ED, call 911, or call MD for any changes in condition.  Patient escorted via WC, and D/C home via private auto.  Camillo FlamingVicki L Candace Ramus, RN 06/26/2016 12:53 PM

## 2016-06-26 NOTE — Discharge Instructions (Signed)
Follow with Primary MD Jaclyn ShaggyEnobong, Amao, MD and your Lung doctor in 7 days   Get CBC, CMP, 2 view Chest X ray checked  by Primary MD or SNF MD in 5-7 days ( we routinely change or add medications that can affect your baseline labs and fluid status, therefore we recommend that you get the mentioned basic workup next visit with your PCP, your PCP may decide not to get them or add new tests based on their clinical decision)   Activity: As tolerated with Full fall precautions use walker/cane & assistance as needed   Disposition Home     Diet:   Heart Healthy    For Heart failure patients - Check your Weight same time everyday, if you gain over 2 pounds, or you develop in leg swelling, experience more shortness of breath or chest pain, call your Primary MD immediately. Follow Cardiac Low Salt Diet and 1.5 lit/day fluid restriction.   On your next visit with your primary care physician please Get Medicines reviewed and adjusted.   Please request your Prim.MD to go over all Hospital Tests and Procedure/Radiological results at the follow up, please get all Hospital records sent to your Prim MD by signing hospital release before you go home.   If you experience worsening of your admission symptoms, develop shortness of breath, life threatening emergency, suicidal or homicidal thoughts you must seek medical attention immediately by calling 911 or calling your MD immediately  if symptoms less severe.  You Must read complete instructions/literature along with all the possible adverse reactions/side effects for all the Medicines you take and that have been prescribed to you. Take any new Medicines after you have completely understood and accpet all the possible adverse reactions/side effects.   Do not drive, operate heavy machinery, perform activities at heights, swimming or participation in water activities or provide baby sitting services if your were admitted for syncope or siezures until you have seen  by Primary MD or a Neurologist and advised to do so again.  Do not drive when taking Pain medications.    Do not take more than prescribed Pain, Sleep and Anxiety Medications  Special Instructions: If you have smoked or chewed Tobacco  in the last 2 yrs please stop smoking, stop any regular Alcohol  and or any Recreational drug use.  Wear Seat belts while driving.   Please note  You were cared for by a hospitalist during your hospital stay. If you have any questions about your discharge medications or the care you received while you were in the hospital after you are discharged, you can call the unit and asked to speak with the hospitalist on call if the hospitalist that took care of you is not available. Once you are discharged, your primary care physician will handle any further medical issues. Please note that NO REFILLS for any discharge medications will be authorized once you are discharged, as it is imperative that you return to your primary care physician (or establish a relationship with a primary care physician if you do not have one) for your aftercare needs so that they can reassess your need for medications and monitor your lab values.

## 2016-06-28 ENCOUNTER — Ambulatory Visit: Payer: Medicaid Other | Admitting: Family Medicine

## 2016-06-28 MED FILL — ?PANTOPRAZOLE SOD DR 40MG: 40 MG | 30 days supply | Qty: 30 | Fill #0

## 2016-06-28 MED FILL — MONTELUKAST SOD 10 MG TAB: 10 | 30 days supply | Qty: 30 | Fill #0

## 2016-06-28 MED FILL — DOXYCYCLINE 100 MG TABLET: 100 | 4 days supply | Qty: 8 | Fill #0

## 2016-06-29 ENCOUNTER — Encounter: Payer: Self-pay | Admitting: Pulmonary Disease

## 2016-06-29 ENCOUNTER — Ambulatory Visit (INDEPENDENT_AMBULATORY_CARE_PROVIDER_SITE_OTHER): Payer: Medicaid Other | Admitting: Pulmonary Disease

## 2016-06-29 VITALS — BP 136/82 | HR 94 | Ht 66.0 in | Wt 271.8 lb

## 2016-06-29 DIAGNOSIS — G4733 Obstructive sleep apnea (adult) (pediatric): Secondary | ICD-10-CM

## 2016-06-29 DIAGNOSIS — J45901 Unspecified asthma with (acute) exacerbation: Secondary | ICD-10-CM

## 2016-06-29 DIAGNOSIS — R05 Cough: Secondary | ICD-10-CM

## 2016-06-29 DIAGNOSIS — R058 Other specified cough: Secondary | ICD-10-CM

## 2016-06-29 MED FILL — predniSONE 5 MG TABS: 5 | 21 days supply | Qty: 95 | Fill #0

## 2016-06-29 NOTE — Progress Notes (Signed)
Reviewed and agree with assessment/plan.  Coralyn HellingVineet Jennilyn Esteve, MD [facility] 06/29/2016, 10:34 AM Pager:  [phone]

## 2016-06-29 NOTE — Progress Notes (Signed)
[facility]   Chief Complaint  Patient presents with  . Hospitalization Follow-up    chest is still very tight, wheezing, coughing up clear mucus, crackling noises in chest.       Current Outpatient Prescriptions on File Prior to Visit  Medication Sig  . acetaminophen (TYLENOL) 500 MG tablet Take 1,000 mg by mouth every 6 (six) hours as needed for moderate pain.  Marland Kitchen albuterol (PROVENTIL) (2.5 MG/3ML) 0.083% nebulizer solution Take 3 mLs (2.5 mg total) by nebulization every 2 (two) hours as needed for wheezing or shortness of breath.  Marland Kitchen albuterol (VENTOLIN HFA) 108 (90 Base) MCG/ACT inhaler Inhale 2 puffs into the lungs every 6 (six) hours as needed for wheezing.  . budesonide (PULMICORT) 0.25 MG/2ML nebulizer solution Take 2 mLs (0.25 mg total) by nebulization 2 (two) times daily. (Patient taking differently: Take 0.25 mg by nebulization 2 (two) times daily as needed (shortness of breath). )  . cloNIDine (CATAPRES) 0.1 MG tablet Take 1 tablet (0.1 mg total) by mouth at bedtime. For hot flashes  . fluticasone (FLONASE) 50 MCG/ACT nasal spray Place 2 sprays into the nose daily. (Patient taking differently: Place 2 sprays into the nose daily as needed for allergies. )  . montelukast (SINGULAIR) 10 MG tablet Take 1 tablet (10 mg total) by mouth at bedtime.  . pantoprazole (PROTONIX) 40 MG tablet Take 1 tablet (40 mg total) by mouth daily at 12 noon.  Marland Kitchen doxycycline (VIBRA-TABS) 100 MG tablet Take 1 tablet (100 mg total) by mouth every 12 (twelve) hours. (Patient not taking: Reported on 06/29/2016)  . predniSONE (DELTASONE) 5 MG tablet Label  & dispense according to the schedule below. 10 Pills PO for 3 days then, 8 Pills PO for 3 days, 6 Pills PO for 3 days, 4 Pills PO for 3 days, 2 Pills PO for 3 days, 1 Pills PO for 3 days, 1/2 Pill  PO for 3 days then STOP. Total 95 pills. (Patient not taking: Reported on 06/29/2016)   Current Facility-Administered Medications on File Prior to Visit   Medication  . 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   Past Surgical hx, Allergies, Family hx, Social hx all reviewed.  Vital Signs BP 136/82 (BP Location: Left Arm, Cuff Size: Large)   Pulse 94   Ht 5\' 6"  (1.676 m)   Wt 271 lb 12.8 oz (123.3 kg)   SpO2 94%   BMI 43.87 kg/m   History of Present Illness Kathleen Rodriguez is a 50 y.o. female, former smoker (quit ~5-6 months ago, smoked age [age], smoked 4 cigs per day)  with a history of polysubstance abuse, HTN, GERD, morbid obesity, anemia, HTN, asthma, chronic cough, upper airway cough syndrome, OSA on CPAP and recent admission from 8/31 - 9/3 for acute hypoxic respiratory failure in the setting of asthma exacerbation (followed by Dr. Craige Cotta for OSA/Ashma) who presented to the pulmonary office 9/6 for hospital follow up.     The patient reports she did not pick Her prescriptions post discharge. She states she had left over prednisone and has taken 1 of those daily since discharge (20 mg tablet).  She also reports she was given 2 tablets of antibiotics  at discharge and she took 1 of those daily (doxycycline Rx for BID).  Patient reports she continues to have a cough with cloudy sputum production, shortness of breath with exertion, one episode of sweating on 9/5, and decreased energy. She denies fevers, chills, chest pain, pain with inspiration.  Uses albuterol / atrovent at home, Albuterol MDI PRN, pulmicort BID  / singulair   Physical Exam  General - well developed adult F in no acute distress ENT - No sinus tenderness, no oral exudate, no LAN Cardiac - s1s2 regular, no  murmur Chest - even/non-labored, lungs bilaterally clear. No wheeze/rales.  Moist cough.  Back - No focal tenderness Abd - Soft, non-tender Ext - No edema Neuro - Normal strength Skin - No rashes Psych - normal mood, and behavior   Assessment/Plan  1.  Acute asthma exacerbation - with recent hospitalization from 8/31-9/3. She was prescribed doxycycline and prednisone at discharge. However, the patient did not pick up prescriptions.  Plan: Patient encouraged to pick up prescriptions as prescribed Discussed completion of antibiotics and prednisone taper.  If no improvement patient instructed to call the office for follow-up Continue pulmicort (?0.25 dosing), albuterol Continue singulair  Pt to call if new or worsening symptoms  2.  Upper airway cough syndrome / ? VCD  Plan: PFTs pending November 1 with M.D. follow-up  3.  Obstructive sleep apnea on CPAP  Plan: Encouraged compliance with CPAP as prescribed   4.  Morbid Obesity   Plan: Pt concerned with prednisone and weight gain.  Encouraged her to finish taper and choose healthy foods   Patient Instructions  1.  Please obtain your antibiotic and prednisone prescription and take as prescribed 2.  Follow-up November 1st at 10 AM for PFTs with follow-up with Dr. Craige CottaSood after PFT 3.  Continue to use pulmonary medications as prescribed 4.  Call for new or worsening symptoms 5. Continue to wear your CPAP mask as prescribed     Canary BrimBrandi Khian Remo, NP-C [facility] Office  [phone] 06/29/2016, 10:26 AM

## 2016-06-29 NOTE — Patient Instructions (Signed)
1.  Please obtain your antibiotic and prednisone prescription and take as prescribed 2.  Follow-up November 1st at 10 AM for PFTs with follow-up with Dr. Craige CottaSood after PFT 3.  Continue to use pulmonary medications as prescribed 4.  Call for new or worsening symptoms 5. Continue to wear your CPAP mask as prescribed

## 2016-07-12 ENCOUNTER — Ambulatory Visit: Payer: Self-pay

## 2016-07-21 ENCOUNTER — Telehealth: Payer: Self-pay | Admitting: Family Medicine

## 2016-07-21 MED ORDER — MELOXICAM 7.5 MG PO TABS: 7.5000 mg | ORAL_TABLET | Freq: Every day | ORAL | 0 refills | Status: DC

## 2016-07-21 MED FILL — MELOXICAM 7.5 MG TABLET: 7.5 | 30 days supply | Qty: 30 | Fill #0

## 2016-07-21 NOTE — Telephone Encounter (Signed)
Prescription for meloxicam sent to pharmacy on file

## 2016-07-21 NOTE — Telephone Encounter (Signed)
Patient stated that her knee "is full of arthritis" according to a recent xray she had.  Writer informed her that Dr. Venetia NightAmao sent a prescription to the pharmacy for meloxicam.    Patient stated that regardless she was going to go to the ED because she wanted another xray done on her knee to see if it has worsened.  Dr. Venetia NightAmao aware.

## 2016-07-21 NOTE — Progress Notes (Signed)
Patient stopped in clinic today to pick up her meloxicam and asked to speak to Clinical research associatewriter.  She states that she is in so much pain with her left knee she can "hardly walk".  Patient would like an ortho referral because she has been told that she needs knee surgery.  Patient also states that she was recently diagnosed with sickle cell and wanted MD to know.

## 2016-07-21 NOTE — Telephone Encounter (Signed)
Patient called the office to speak with PCP regarding the pain she is experiencing on her knee. Tylenol is not helping with pain. She understands that she needs to see a pain specialist but she cannot wait until appt 10/4 to get a referral. Please follow up.

## 2016-07-22 NOTE — Progress Notes (Signed)
She will need to come in for an office visit to discuss her knee pain.

## 2016-07-25 NOTE — Progress Notes (Signed)
Appointment made

## 2016-07-27 ENCOUNTER — Encounter: Payer: Self-pay | Admitting: Family Medicine

## 2016-07-27 ENCOUNTER — Ambulatory Visit: Payer: Self-pay | Attending: Family Medicine | Admitting: Family Medicine

## 2016-07-27 VITALS — BP 145/77 | HR 89 | Temp 97.5°F | Ht 66.0 in | Wt 272.4 lb

## 2016-07-27 DIAGNOSIS — I509 Heart failure, unspecified: Secondary | ICD-10-CM | POA: Insufficient documentation

## 2016-07-27 DIAGNOSIS — F1410 Cocaine abuse, uncomplicated: Secondary | ICD-10-CM | POA: Insufficient documentation

## 2016-07-27 DIAGNOSIS — K219 Gastro-esophageal reflux disease without esophagitis: Secondary | ICD-10-CM | POA: Insufficient documentation

## 2016-07-27 DIAGNOSIS — M25561 Pain in right knee: Secondary | ICD-10-CM

## 2016-07-27 DIAGNOSIS — I10 Essential (primary) hypertension: Secondary | ICD-10-CM

## 2016-07-27 DIAGNOSIS — M19071 Primary osteoarthritis, right ankle and foot: Secondary | ICD-10-CM | POA: Insufficient documentation

## 2016-07-27 DIAGNOSIS — E669 Obesity, unspecified: Secondary | ICD-10-CM | POA: Insufficient documentation

## 2016-07-27 DIAGNOSIS — Z79899 Other long term (current) drug therapy: Secondary | ICD-10-CM

## 2016-07-27 DIAGNOSIS — G4733 Obstructive sleep apnea (adult) (pediatric): Secondary | ICD-10-CM

## 2016-07-27 DIAGNOSIS — D571 Sickle-cell disease without crisis: Secondary | ICD-10-CM | POA: Insufficient documentation

## 2016-07-27 DIAGNOSIS — J45909 Unspecified asthma, uncomplicated: Secondary | ICD-10-CM | POA: Insufficient documentation

## 2016-07-27 DIAGNOSIS — M170 Bilateral primary osteoarthritis of knee: Secondary | ICD-10-CM | POA: Insufficient documentation

## 2016-07-27 DIAGNOSIS — G8929 Other chronic pain: Secondary | ICD-10-CM

## 2016-07-27 DIAGNOSIS — M25562 Pain in left knee: Secondary | ICD-10-CM

## 2016-07-27 DIAGNOSIS — M47816 Spondylosis without myelopathy or radiculopathy, lumbar region: Secondary | ICD-10-CM | POA: Insufficient documentation

## 2016-07-27 DIAGNOSIS — F17210 Nicotine dependence, cigarettes, uncomplicated: Secondary | ICD-10-CM | POA: Insufficient documentation

## 2016-07-27 DIAGNOSIS — M19072 Primary osteoarthritis, left ankle and foot: Secondary | ICD-10-CM | POA: Insufficient documentation

## 2016-07-27 LAB — POCT GLYCOSYLATED HEMOGLOBIN (HGB A1C): Hemoglobin A1C: 5.4

## 2016-07-27 MED ORDER — MELOXICAM 7.5 MG PO TABS: 7.5000 mg | ORAL_TABLET | Freq: Every day | ORAL | 1 refills | Status: DC

## 2016-07-27 MED ORDER — ALBUTEROL SULFATE HFA 108 (90 BASE) MCG/ACT IN AERS: 2.0000 | INHALATION_SPRAY | Freq: Four times a day (QID) | RESPIRATORY_TRACT | 5 refills | Status: DC | PRN

## 2016-07-27 MED ORDER — ACETAMINOPHEN-CODEINE #3 300-30 MG PO TABS: 1.0000 | ORAL_TABLET | Freq: Two times a day (BID) | ORAL | 0 refills | Status: DC | PRN

## 2016-07-27 MED ORDER — PANTOPRAZOLE SODIUM 40 MG PO TBEC: 40.0000 mg | DELAYED_RELEASE_TABLET | Freq: Every day | ORAL | 5 refills | Status: DC

## 2016-07-27 MED ORDER — MONTELUKAST SODIUM 10 MG PO TABS: 10.0000 mg | ORAL_TABLET | Freq: Every day | ORAL | 5 refills | Status: DC

## 2016-07-27 MED FILL — PANTOPRAZOLE SOD DR 40 MG T: 40 | 30 days supply | Qty: 30 | Fill #0

## 2016-07-27 MED FILL — VENTOLIN HFA 90 MCG INHALER: 108 (90 BAS | 25 days supply | Qty: 18 | Fill #0

## 2016-07-27 MED FILL — MONTELUKAST SOD 10 MG TAB: 10 | 30 days supply | Qty: 30 | Fill #0

## 2016-07-27 MED FILL — ACETAMINOPHEN/COD #3 TABLET: 300-30 | 30 days supply | Qty: 60 | Fill #0

## 2016-07-27 NOTE — Patient Instructions (Signed)

## 2016-07-27 NOTE — Progress Notes (Signed)
Medication refills

## 2016-07-28 ENCOUNTER — Telehealth: Payer: Self-pay | Admitting: Family Medicine

## 2016-07-28 NOTE — Telephone Encounter (Signed)
Writer called patient back to explain per Dr. Venetia NightAmao that she does not need diet pills because her A1C was normal.  Patient became very angry and stated that she is "very overweight" and this is why she is having the problem with her knees.  She feels that MD won't give her a referral to pain management and now won't give her diet pills to help her with weight loss.  Patient states that she will be going to another doctor.

## 2016-07-28 NOTE — Telephone Encounter (Signed)
Pt. Called requesting to speak with her nurse.  Pt. States she has some questions to ask. Please f/u

## 2016-07-28 NOTE — Progress Notes (Signed)
Subjective:  Patient ID: Kathleen Rodriguez, female    DOB: 02/21/1966  Age: 50 y.o. MRN: [id number]  CC: Knee Pain (pain and swelling left > right); Asthma; Sickle Cell Anemia; and Chest Pain   HPI Kathleen AbideLouise Emmick is a 50 year old female with a history of asthma, obesity, obstructive sleep apnea with hospitalization for asthma exacerbation from 06/23/16 through 06/26/16 where she received azithromycin and prednisone which she has completed at this time and reports improvement in symptoms. Asthma is managed by pulmonary.  She informs me that she was diagnosed with sickle cell disease during hospitalization but review of her chart reveals no evidence of sickle cell disease.  Today she complains of pain in both knees worse in the left knee; previously received cortisone shots from orthopedics however she has lost her insurance and is not able to see orthopedics. And is worse with going up stairs and she endorses swelling of the knee. I had prescribed meloxicam previously for her when she called complaining of the above symptoms however she states this does not help much.  Wondering if she can get "diet pill"however she endorses excessive snacking and no description regards to eating. She is not actively involved in any form of exercise. Past Medical History:  Diagnosis Date  . Arthritis    "knees, lower back; legs, ankles" (01/27/2016)  . Asthma   . CHF (congestive heart failure) (HCC) 2016   "when I went into a coma"  . Cocaine abuse   . 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 GERD (gastroesophageal reflux disease)   . Hypertension    "doctor took me off RX in 2016" (01/27/2016)  . Influenza B April 2014   Complicated by multi-organ failure  . OSA on CPAP "since " 03/20/2013  . Pneumonia 2016  . Required emergent intubation    asthma exacerbation in 2016  . Tobacco abuse     Past Surgical History:  Procedure Laterality Date  . CESAREAN SECTION  2006  . LACERATION REPAIR Right ~ 1997   "tried to cut myself"  . TUBAL LIGATION  2006    Allergies  Allergen Reactions  . Latex Itching and Rash  . Other Hives and Itching    Ketchup  . Tomato Hives and Itching  . Wool Alcohol [Lanolin] Itching     Outpatient Medications Prior to Visit  Medication Sig Dispense Refill  . acetaminophen (TYLENOL) 500 MG tablet Take 1,000 mg by mouth every 6 (six) hours as needed for moderate pain.    Marland Kitchen. albuterol (PROVENTIL) (2.5 MG/3ML) 0.083% nebulizer solution Take 3 mLs (2.5 mg total) by nebulization every 2 (two) hours as needed for wheezing or shortness of breath. 75 mL 5  . budesonide (PULMICORT) 0.25 MG/2ML nebulizer solution Take 2 mLs (0.25 mg total) by nebulization 2 (two) times daily. (Patient taking differently: Take 0.25 mg by nebulization 2 (two) times daily as needed (shortness of breath). ) 60 mL 5  . cloNIDine (CATAPRES) 0.1 MG tablet Take 1 tablet (0.1 mg total) by mouth at bedtime. For hot flashes 30 tablet 3  . fluticasone (FLONASE) 50 MCG/ACT nasal spray Place 2 sprays into the nose daily. (Patient taking differently: Place 2 sprays into the nose daily as needed for allergies. ) 16 g 0  . albuterol (VENTOLIN HFA) 108 (90 Base) MCG/ACT inhaler Inhale 2 puffs into the lungs every 6 (six) hours as needed for wheezing. 1 Inhaler 5  . montelukast (SINGULAIR) 10 MG tablet Take 1 tablet (10 mg total) by mouth  at bedtime. 30 tablet 0  . pantoprazole (PROTONIX) 40 MG tablet Take 1 tablet (40 mg total) by mouth daily at 12 noon. 30 tablet 0  . doxycycline (VIBRA-TABS) 100 MG tablet Take 1 tablet (100 mg total) by mouth every 12 (twelve) hours. (Patient not taking: Reported on 07/27/2016) 8 tablet 0  . meloxicam (MOBIC) 7.5 MG tablet Take 1 tablet (7.5 mg total) by mouth daily. (Patient not taking: Reported on 07/27/2016) 30 tablet 0  . predniSONE (DELTASONE) 5 MG tablet Label  & dispense according to the schedule below. 10 Pills PO for 3 days then, 8 Pills PO for 3 days, 6 Pills PO for 3  days, 4 Pills PO for 3 days, 2 Pills PO for 3 days, 1 Pills PO for 3 days, 1/2 Pill  PO for 3 days then STOP. Total 95 pills. (Patient not taking: Reported on 07/27/2016) 95 tablet 0   Facility-Administered Medications Prior to Visit  Medication Dose Route Frequency Provider Last Rate Last Dose  . nystatin cream (MYCOSTATIN)   Topical BID Penny Pia, MD        ROS Review of Systems  Constitutional: Negative for activity change, appetite change and fatigue.  HENT: Negative for congestion, sinus pressure and sore throat.   Eyes: Negative for visual disturbance.  Respiratory: Negative for cough, chest tightness, shortness of breath and wheezing.   Cardiovascular: Negative for chest pain and palpitations.  Gastrointestinal: Negative for abdominal distention, abdominal pain and constipation.  Endocrine: Negative for polydipsia.  Genitourinary: Negative for dysuria and frequency.  Musculoskeletal:       See history of present illness  Skin: Negative for rash.  Neurological: Negative for tremors, light-headedness and numbness.  Hematological: Does not bruise/bleed easily.  Psychiatric/Behavioral: Negative for agitation and behavioral problems.    Objective:  BP (!) 145/77 (BP Location: Right Arm, Patient Position: Sitting, Cuff Size: Large)   Pulse 89   Temp 97.5 F (36.4 C) (Oral)   Ht 5\' 6"  (1.676 m)   Wt 272 lb 6.4 oz (123.6 kg)   SpO2 96%   BMI 43.97 kg/m   BP/Weight 07/27/2016 06/29/2016 06/26/2016  Systolic BP 145 136 143  Diastolic BP 77 82 82  Wt. (Lbs) 272.4 271.8 -  BMI 43.97 43.87 -      Physical Exam  Constitutional: She is oriented to person, place, and time. She appears well-developed and well-nourished.  Cardiovascular: Normal rate, normal heart sounds and intact distal pulses.   No murmur heard. Pulmonary/Chest: Effort normal. She has no wheezes. She has no rales. She exhibits no tenderness.  Breath sounds mildly decreased  Abdominal: Soft. Bowel sounds are  normal. She exhibits no distension and no mass. There is no tenderness.  Musculoskeletal:  Crepitus on both knees bilaterally with associated tenderness on range of motion  Neurological: She is alert and oriented to person, place, and time.     Assessment & Plan:   1. High risk medication use Will need to screen for diabetes mellitus given frequent steroid use. A1c was 5.4 - albuterol (VENTOLIN HFA) 108 (90 Base) MCG/ACT inhaler; Inhale 2 puffs into the lungs every 6 (six) hours as needed for wheezing.  Dispense: 1 Inhaler; Refill: 5 - pantoprazole (PROTONIX) 40 MG tablet; Take 1 tablet (40 mg total) by mouth daily at 12 noon.  Dispense: 30 tablet; Refill: 5 - montelukast (SINGULAIR) 10 MG tablet; Take 1 tablet (10 mg total) by mouth at bedtime.  Dispense: 30 tablet; Refill: 5 - meloxicam (MOBIC) 7.5  MG tablet; Take 1 tablet (7.5 mg total) by mouth daily.  Dispense: 30 tablet; Refill: 1 - acetaminophen-codeine (TYLENOL #3) 300-30 MG tablet; Take 1 tablet by mouth every 12 (twelve) hours as needed for moderate pain.  Dispense: 60 tablet; Refill: 0 - HgB A1c  2. Essential hypertension Controlled - albuterol (VENTOLIN HFA) 108 (90 Base) MCG/ACT inhaler; Inhale 2 puffs into the lungs every 6 (six) hours as needed for wheezing.  Dispense: 1 Inhaler; Refill: 5 - pantoprazole (PROTONIX) 40 MG tablet; Take 1 tablet (40 mg total) by mouth daily at 12 noon.  Dispense: 30 tablet; Refill: 5 - montelukast (SINGULAIR) 10 MG tablet; Take 1 tablet (10 mg total) by mouth at bedtime.  Dispense: 30 tablet; Refill: 5 - meloxicam (MOBIC) 7.5 MG tablet; Take 1 tablet (7.5 mg total) by mouth daily.  Dispense: 30 tablet; Refill: 1 - acetaminophen-codeine (TYLENOL #3) 300-30 MG tablet; Take 1 tablet by mouth every 12 (twelve) hours as needed for moderate pain.  Dispense: 60 tablet; Refill: 0 - HgB A1c  3. Asthma and OSA (obstructive sleep apnea) Completed course of Z-Pak and prednisone Keep appointment with  pulmonology. - albuterol (VENTOLIN HFA) 108 (90 Base) MCG/ACT inhaler; Inhale 2 puffs into the lungs every 6 (six) hours as needed for wheezing.  Dispense: 1 Inhaler; Refill: 5 - pantoprazole (PROTONIX) 40 MG tablet; Take 1 tablet (40 mg total) by mouth daily at 12 noon.  Dispense: 30 tablet; Refill: 5 - montelukast (SINGULAIR) 10 MG tablet; Take 1 tablet (10 mg total) by mouth at bedtime.  Dispense: 30 tablet; Refill: 5 - meloxicam (MOBIC) 7.5 MG tablet; Take 1 tablet (7.5 mg total) by mouth daily.  Dispense: 30 tablet; Refill: 1 - acetaminophen-codeine (TYLENOL #3) 300-30 MG tablet; Take 1 tablet by mouth every 12 (twelve) hours as needed for moderate pain.  Dispense: 60 tablet; Refill: 0 - HgB A1c  4. Chronic pain of both knees Received steroid shots in the past. Continue meloxicam Tylenol 3 added to regimen Once she has been approved for the [facility] discount I will place a referral to orthopedics - DG Knee Complete 4 Views Left; Future - DG Knee Complete 4 Views Right; Future  Discussed increasing physical activity and calorie restriction; knee pain poses restriction with regards to exercise.  Meds ordered this encounter  Medications  . albuterol (VENTOLIN HFA) 108 (90 Base) MCG/ACT inhaler    Sig: Inhale 2 puffs into the lungs every 6 (six) hours as needed for wheezing.    Dispense:  1 Inhaler    Refill:  5    Order Specific Question:   Lot Number?    Answer:   [id number]    Order Specific Question:   Expiration Date?    Answer:   07/24/2014    Order Specific Question:   Quantity    Answer:   1  . pantoprazole (PROTONIX) 40 MG tablet    Sig: Take 1 tablet (40 mg total) by mouth daily at 12 noon.    Dispense:  30 tablet    Refill:  5  . montelukast (SINGULAIR) 10 MG tablet    Sig: Take 1 tablet (10 mg total) by mouth at bedtime.    Dispense:  30 tablet    Refill:  5  . meloxicam (MOBIC) 7.5 MG tablet    Sig: Take 1 tablet (7.5 mg total) by mouth daily.    Dispense:  30  tablet    Refill:  1  . acetaminophen-codeine (TYLENOL #3) 300-30  MG tablet    Sig: Take 1 tablet by mouth every 12 (twelve) hours as needed for moderate pain.    Dispense:  60 tablet    Refill:  0    Follow-up: Return in about 3 months (around 10/27/2016) for Follow-up for asthma.   [name] MD

## 2016-07-29 ENCOUNTER — Ambulatory Visit (HOSPITAL_COMMUNITY)
Admission: RE | Admit: 2016-07-29 | Discharge: 2016-07-29 | Disposition: A | Discharge status: Home / Self Care | Payer: Medicaid Other | Source: Ambulatory Visit | Attending: Family Medicine | Admitting: Family Medicine

## 2016-07-29 ENCOUNTER — Telehealth: Payer: Self-pay | Admitting: Family Medicine

## 2016-07-29 DIAGNOSIS — M25561 Pain in right knee: Principal | ICD-10-CM

## 2016-07-29 DIAGNOSIS — M25462 Effusion, left knee: Secondary | ICD-10-CM | POA: Insufficient documentation

## 2016-07-29 DIAGNOSIS — M25562 Pain in left knee: Principal | ICD-10-CM

## 2016-07-29 DIAGNOSIS — M170 Bilateral primary osteoarthritis of knee: Secondary | ICD-10-CM | POA: Insufficient documentation

## 2016-07-29 DIAGNOSIS — G8929 Other chronic pain: Secondary | ICD-10-CM

## 2016-07-29 DIAGNOSIS — M25461 Effusion, right knee: Secondary | ICD-10-CM | POA: Insufficient documentation

## 2016-07-29 NOTE — Telephone Encounter (Signed)
Pt calling to see if PCP has received results from X-ray of pts knees   Pt wanting to inform PCP that Tylenol #3 Rx is not working and making her nauseas. Pt states she did eat before taking the medication but still felt nauseas   Pt is also requesting a referral to the pain management clinic

## 2016-08-01 ENCOUNTER — Other Ambulatory Visit: Payer: Self-pay | Admitting: Family Medicine

## 2016-08-01 ENCOUNTER — Telehealth: Payer: Self-pay

## 2016-08-01 DIAGNOSIS — G8929 Other chronic pain: Secondary | ICD-10-CM

## 2016-08-01 DIAGNOSIS — M25562 Pain in left knee: Principal | ICD-10-CM

## 2016-08-01 DIAGNOSIS — M170 Bilateral primary osteoarthritis of knee: Secondary | ICD-10-CM

## 2016-08-01 DIAGNOSIS — M25561 Pain in right knee: Principal | ICD-10-CM

## 2016-08-01 NOTE — Telephone Encounter (Signed)
Writer called patient per Dr. Armen PickupFunches regarding patient's knee xray.  [facility] - Dr. Armen PickupFunches placed a referral to ortho today. Patient states that her ankles and feet especially the left side are very swollen and painful.  Patient is requesting a different pain medication stating that tylenol #3 is not working to relieve the pain.  Patient is hoping that MD would prescribe medication without being seen.

## 2016-08-01 NOTE — Telephone Encounter (Signed)
Writer called patient with xray results today and routed a msg to Dr. Venetia NightAmao.

## 2016-08-01 NOTE — Telephone Encounter (Signed)
-----   Message from Kathleen PhiJosalyn Funches, MD sent at 08/01/2016  9:13 AM EDT ----- Severe osteoarthritis in both knees Dr. Bella KennedyAmoa has already planned to refer patient back to ortho once she has [facility] card

## 2016-08-02 ENCOUNTER — Telehealth: Payer: Self-pay | Admitting: Family Medicine

## 2016-08-02 MED ORDER — PREDNISONE 20 MG PO TABS: 20.0000 mg | ORAL_TABLET | Freq: Every day | ORAL | 0 refills | Status: DC

## 2016-08-02 MED FILL — predniSONE 20 MG TABS: 20 | 5 days supply | Qty: 5 | Fill #0

## 2016-08-02 NOTE — Addendum Note (Signed)
Addended by: Jaclyn ShaggyAMAO, Erielle Gawronski on: 08/02/2016 04:43 PM   Modules accepted: Orders

## 2016-08-02 NOTE — Telephone Encounter (Signed)
Prescription for prednisone sent to pharmacy

## 2016-08-02 NOTE — Telephone Encounter (Signed)
Pt. States she was on the phone with nurse yesterday and call got disconnected.  Please follow up.

## 2016-08-02 NOTE — Telephone Encounter (Signed)
Pls refer to my notes about her knee pain and specialist referral. You could provide her the names to Ortho and Pain management so she can call them; I'm sure they will be willing to see her if she is agreeable to their terms of payment if she does not want to apply for the [employer] discount. As for metformin, it is not indicated in her case because she does not have Diabetes or pre Diabetes.

## 2016-08-03 ENCOUNTER — Telehealth: Payer: Self-pay | Admitting: Family Medicine

## 2016-08-03 NOTE — Telephone Encounter (Signed)
Patient notified that prednisone is at the pharmacy and ready for pick up.  Patient is also requesting a temporary medication for pain and states that the tylenol #3 is not helping with the pain.  She has an appt on Monday with ortho for her knees.

## 2016-08-03 NOTE — Telephone Encounter (Signed)
[facility] and already has an appt for her knees on Monday.

## 2016-08-03 NOTE — Telephone Encounter (Signed)
Patient would like to know what time she should come in to have leg looks at on Friday 13th

## 2016-08-04 NOTE — Telephone Encounter (Signed)
We write Tylenol #3 or tramadol here in the clinic.  If she would rather have Tramadol I could write that for her.

## 2016-08-04 NOTE — Telephone Encounter (Signed)
Writer spoke with patient who states that tramadol makes her way too energetic and she can't calm down when she takes it.  She is requesting that you place a pain clinic referral.

## 2016-08-05 ENCOUNTER — Ambulatory Visit: Payer: Medicaid Other | Admitting: Family Medicine

## 2016-08-05 ENCOUNTER — Ambulatory Visit: Payer: Medicaid Other | Attending: Family Medicine

## 2016-08-05 NOTE — Addendum Note (Signed)
Addended by: Jaclyn ShaggyAMAO, Sharee Sturdy on: 08/05/2016 01:54 PM   Modules accepted: Orders

## 2016-08-05 NOTE — Telephone Encounter (Signed)
Referral placed.

## 2016-08-08 ENCOUNTER — Encounter: Payer: Self-pay | Admitting: Sports Medicine

## 2016-08-08 ENCOUNTER — Ambulatory Visit (INDEPENDENT_AMBULATORY_CARE_PROVIDER_SITE_OTHER): Payer: Self-pay | Admitting: Sports Medicine

## 2016-08-08 ENCOUNTER — Telehealth: Payer: Self-pay | Admitting: Family Medicine

## 2016-08-08 VITALS — BP 132/78 | HR 84 | Ht 66.0 in | Wt 272.0 lb

## 2016-08-08 DIAGNOSIS — M170 Bilateral primary osteoarthritis of knee: Secondary | ICD-10-CM

## 2016-08-08 NOTE — Patient Instructions (Signed)
[facility] Dr Roda ShuttersXu Kathleen Ghee([place]) Thursday October 26th at 10:15am [address], [place], [place] [address] Phone: [phone]

## 2016-08-08 NOTE — Progress Notes (Signed)
   Subjective:    Patient ID: Kathleen Rodriguez, female    DOB: 09/12/1966, 50 y.o.   MRN: [id number]  HPI chief complaint: Bilateral knee pain  50 year old female comes in today complaining of long-standing bilateral knee pain. Recent standing x-rays show end-stage bone-on-bone medial compartmental DJD. She has had multiple treatments in the past including medications and injections. None of these have been helpful. Currently on tramadol which is not helping.  Past medical history reviewed Medications reviewed Allergies reviewed    Review of Systems     Objective:   Physical Exam  Well-developed, obese. No acute distress  Examination of each knee shows limited range of motion. 1+ boggy synovitis. 1+ patellofemoral crepitus. Diffuse tenderness to palpation. Varus deformity with standing. Neurovascularly intact distally.  X-rays are as above      Assessment & Plan:   Bilateral knee pain secondary to end-stage medial compartmental DJD Obesity  Patient is referred to Dr. Roda ShuttersXu to discuss merits of total knee arthroplasty. Her current BMI is 43 so she may need to lose some weight before he considers surgery. Her other medical comorbidities may also preclude her from surgery. If Dr.[name] decides against surgery, her only real option at that point would be pain management. Follow-up with me as needed.

## 2016-08-08 NOTE — Telephone Encounter (Signed)
Pt came in stating that she has an appointment for surgery on both of her knees and is requesting a referral to the pain management clinic   Pt states the medications naproxen and tramadol were discussed in previous OV and pt states she would like to try these medications   Pt requesting a call back from nurse at [phone]

## 2016-08-09 NOTE — Telephone Encounter (Signed)
Please review the thread from 08/01/16 with the patient. Thank you.

## 2016-08-09 NOTE — Telephone Encounter (Signed)
Writer called patient regarding pain management clinic and informed her that the referral was placed 08/05/16.

## 2016-08-15 ENCOUNTER — Telehealth: Payer: Self-pay | Admitting: Family Medicine

## 2016-08-15 DIAGNOSIS — K009 Disorder of tooth development, unspecified: Secondary | ICD-10-CM

## 2016-08-15 DIAGNOSIS — H527 Unspecified disorder of refraction: Secondary | ICD-10-CM

## 2016-08-15 NOTE — Telephone Encounter (Signed)
Patient called the office asking to speak with nurse regarding a referral for eye doctor and dentist. Please follow up.  Thank you

## 2016-08-16 ENCOUNTER — Ambulatory Visit: Payer: Medicaid Other | Admitting: Family Medicine

## 2016-08-16 NOTE — Telephone Encounter (Signed)
Writer called patient to let her know the referrals have been placed.  Patient is now requesting fluconazole for a "yeast infection from the prednisone".

## 2016-08-16 NOTE — Telephone Encounter (Signed)
Done

## 2016-08-17 MED ORDER — FLUCONAZOLE 150 MG PO TABS: 150.0000 mg | ORAL_TABLET | Freq: Once | ORAL | 0 refills | Status: DC

## 2016-08-17 MED FILL — FLUCONAZOLE 150 MG TABLET: 150 | 1 days supply | Qty: 1 | Fill #0

## 2016-08-17 NOTE — Telephone Encounter (Signed)
Done

## 2016-08-17 NOTE — Addendum Note (Signed)
Addended by: Jaclyn ShaggyAMAO, Zymir Napoli on: 08/17/2016 01:59 PM   Modules accepted: Orders

## 2016-08-18 ENCOUNTER — Other Ambulatory Visit: Payer: Self-pay | Admitting: Orthopaedic Surgery

## 2016-08-18 ENCOUNTER — Encounter (INDEPENDENT_AMBULATORY_CARE_PROVIDER_SITE_OTHER): Payer: Self-pay | Admitting: Orthopaedic Surgery

## 2016-08-18 ENCOUNTER — Ambulatory Visit (INDEPENDENT_AMBULATORY_CARE_PROVIDER_SITE_OTHER): Payer: Self-pay | Admitting: Orthopaedic Surgery

## 2016-08-18 VITALS — Ht 66.0 in | Wt 262.0 lb

## 2016-08-18 DIAGNOSIS — M1712 Unilateral primary osteoarthritis, left knee: Secondary | ICD-10-CM

## 2016-08-18 DIAGNOSIS — M1711 Unilateral primary osteoarthritis, right knee: Secondary | ICD-10-CM

## 2016-08-18 MED ORDER — HYDROCODONE-ACETAMINOPHEN 5-325 MG PO TABS: 1.0000 | ORAL_TABLET | Freq: Two times a day (BID) | ORAL | 0 refills | Status: DC | PRN

## 2016-08-18 NOTE — Progress Notes (Signed)
Patient ID: Kathleen AbideLouise Dauria, female   DOB: 05/20/1966, 50 y.o.   MRN: [id number]   Chief Complaint  Patient presents with  . Right Knee - Pain  . Left Knee - Pain    Kathleen Rodriguez is a 50 y.o. female.   HPI 50 yo female with chronic bilateral knee pain worse on left presents today for evaluation.  Referred to us by Dr. Margaretha Sheffieldraper.  She's severely limited by her knees due to chronic grinding, throbbing, popping, burning, tingling pain in both knees.  Pain is worse with ambulation.  Takes tylenol, tramadol without relief.  Injections and PT have not helped.  Pain does not radiate.    Review of Systems All systems reviewed and negative except HPI  Past Medical History:  Diagnosis Date  . Arthritis    "knees, lower back; legs, ankles" (01/27/2016)  . Asthma   . CHF (congestive heart failure) (HCC) 2016   "when I went into a coma"  . Cocaine abuse   . 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 GERD (gastroesophageal reflux disease)   . Hypertension    "doctor took me off RX in 2016" (01/27/2016)  . Influenza B April 2014   Complicated by multi-organ failure  . OSA on CPAP "since " 03/20/2013  . Pneumonia 2016  . Required emergent intubation    asthma exacerbation in 2016  . Tobacco abuse     Past Surgical History:  Procedure Laterality Date  . CESAREAN SECTION  2006  . LACERATION REPAIR Right ~ 1997   "tried to cut myself"  . TUBAL LIGATION  2006    Allergies  Allergen Reactions  . Latex Itching and Rash  . Other Hives and Itching    Ketchup  . Tomato Hives and Itching  . Wool Alcohol [Lanolin] Itching    Current Outpatient Prescriptions  Medication Sig Dispense Refill  . acetaminophen (TYLENOL) 500 MG tablet Take 1,000 mg by mouth every 6 (six) hours as needed for moderate pain.    Marland Kitchen. acetaminophen-codeine (TYLENOL #3) 300-30 MG tablet Take 1 tablet by mouth every 12 (twelve) hours as needed for moderate pain. 60 tablet 0  . albuterol (PROVENTIL) (2.5 MG/3ML) 0.083%  nebulizer solution Take 3 mLs (2.5 mg total) by nebulization every 2 (two) hours as needed for wheezing or shortness of breath. 75 mL 5  . albuterol (VENTOLIN HFA) 108 (90 Base) MCG/ACT inhaler Inhale 2 puffs into the lungs every 6 (six) hours as needed for wheezing. 1 Inhaler 5  . budesonide (PULMICORT) 0.25 MG/2ML nebulizer solution Take 2 mLs (0.25 mg total) by nebulization 2 (two) times daily. (Patient taking differently: Take 0.25 mg by nebulization 2 (two) times daily as needed (shortness of breath). ) 60 mL 5  . fluticasone (FLONASE) 50 MCG/ACT nasal spray Place 2 sprays into the nose daily. (Patient taking differently: Place 2 sprays into the nose daily as needed for allergies. ) 16 g 0  . cloNIDine (CATAPRES) 0.1 MG tablet Take 1 tablet (0.1 mg total) by mouth at bedtime. For hot flashes (Patient not taking: Reported on 08/18/2016) 30 tablet 3  . HYDROcodone-acetaminophen (NORCO) 5-325 MG tablet Take 1 tablet by mouth 2 (two) times daily as needed. 20 tablet 0  . meloxicam (MOBIC) 7.5 MG tablet Take 1 tablet (7.5 mg total) by mouth daily. (Patient not taking: Reported on 08/18/2016) 30 tablet 1  . montelukast (SINGULAIR) 10 MG tablet Take 1 tablet (10 mg total) by mouth at bedtime. (Patient not taking: Reported  on 08/18/2016) 30 tablet 5  . pantoprazole (PROTONIX) 40 MG tablet Take 1 tablet (40 mg total) by mouth daily at 12 noon. (Patient not taking: Reported on 08/18/2016) 30 tablet 5  . predniSONE (DELTASONE) 20 MG tablet Take 1 tablet (20 mg total) by mouth daily with breakfast. (Patient not taking: Reported on 08/18/2016) 5 tablet 0   Current Facility-Administered Medications  Medication Dose Route Frequency Provider Last Rate Last Dose  . nystatin cream (MYCOSTATIN)   Topical BID Penny Pia, MD         Physical Exam Height 5\' 6"  (1.676 m), weight 262 lb (118.8 kg).  BMI 43 Physical Exam   The patient is well developed well nourished and well groomed.   Orientation to person  place and time is normal   Mood is pleasant.  Ambulatory status is remarkable for a limp involving the involved extremity         Bilateral Knee examination:  Inspection: Tenderness is noted over the medial joint line   ROM: Is limited by pain with a maximum flexion arc of 105  Stability: Collateral ligaments are stable, the Lachman test and anterior and posterior drawer tests are normal  Motor exam: Grade 5 motor strength in the quadriceps musculature  Skin: Warm dry and intact over the right leg                       Neuro: normal sensation   Vascular: 2+ DP pulse with normal color and no edema.   Currently the bilateral lower extremity and knee examination revealed no tenderness or swelling, full range of motion without contracture subluxation atrophy or tremor. Normal muscle tone no instability and the neurovascular status of the limb is normal.    Impression and Plan:  IMAGING: I have independently read and interpreted the xrays as follows: advanced bilateral knee DJD with varus deformities   Diagnosis and treatment:  Advanced DJD bilateral knee Osteoarthritis of the knee  - norco #20 prescribed - discussed TKA and increased BMI but she doesn't have any of the other associated risk factors - body habitus is quite conducive to surgery but she still does understand that based on pure BMI she is statistically at higher risk for complications - she has tried extensive conservative treatment (PT, injections, weight loss) and failed to alleviate the pain - we will plan on getting her scheduled for left TKA at her convenience   N. Glee Arvin, MD [facility] 9:26 PM

## 2016-08-18 NOTE — Progress Notes (Deleted)
Office Visit Note   Patient: Kathleen Rodriguez           Date of Birth: 22-Nov-1965           MRN: [id number] Visit Date: 08/18/2016              Requested by: [name], MD [address] [place], [place] [address] PCP: [name], MD   Assessment & Plan: Visit Diagnoses: No diagnosis found.  Plan: ***  Follow-Up Instructions: No Follow-up on file.   Orders:  No orders of the defined types were placed in this encounter.  No orders of the defined types were placed in this encounter.     Procedures: No procedures performed   Clinical Data: No additional findings.   Subjective: Chief Complaint  Patient presents with  . Right Knee - Pain  . Left Knee - Pain    HPI  Review of Systems   Objective: Vital Signs: Ht 5\' 6"  (1.676 m)   Wt 262 lb (118.8 kg)   BMI 42.29 kg/m   Physical Exam  Ortho Exam  Specialty Comments:  No specialty comments available.  Imaging: No results found.   PMFS History: Patient Active Problem List   Diagnosis Date Noted  . Knee pain, 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  . Status asthmaticus 06/24/2016  . Acute respiratory failure (HCC) 06/24/2016  . Acute asthma exacerbation 05/12/2016  . Acute respiratory failure with hypoxia (HCC) 05/12/2016  . Tobacco abuse   . Cocaine abuse   . Osteoarthritis of both knees 03/09/2016  . Asthma with status asthmaticus 01/27/2016  . Acute respiratory distress 01/27/2016  . Non compliance with medical treatment 08/19/2015  . Anemia, iron deficiency 08/12/2015  . Essential hypertension 07/25/2015  . Sinus tachycardia (HCC) 07/25/2015  . Acute respiratory failure with hypercapnia (HCC) 07/24/2015  . COPD (chronic obstructive pulmonary disease) (HCC) 12/28/2014  . Tracheobronchitis 12/28/2014  . Dysfunctional uterine bleeding 12/28/2014  . Anemia 12/28/2014  . Pleuritic chest pain 05/16/2014  . Morbid obesity (HCC) 05/16/2014  . Chronic cough 05/01/2014  . Upper airway cough syndrome  05/01/2014  . Vaginal candidiasis 04/09/2014  . GERD (gastroesophageal reflux disease) 04/08/2014  . Medically noncompliant 04/07/2014  . Asthma exacerbation 04/06/2014  . CAP (community acquired pneumonia) 04/06/2014  . Chest pain 04/01/2014  . Hypertension 09/24/2013  . OSA (obstructive sleep apnea) 03/20/2013  . Uncontrolled persistent asthma 02/05/2013   Past Medical History:  Diagnosis Date  . Arthritis    "knees, lower back; legs, ankles" (01/27/2016)  . Asthma   . CHF (congestive heart failure) (HCC) 2016   "when I went into a coma"  . Cocaine abuse   . 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 GERD (gastroesophageal reflux disease)   . Hypertension    "doctor took me off RX in 2016" (01/27/2016)  . Influenza B April 2014   Complicated by multi-organ failure  . OSA on CPAP "since " 03/20/2013  . Pneumonia 2016  . Required emergent intubation    asthma exacerbation in 2016  . Tobacco abuse     Family History  Problem Relation Age of Onset  . Hypertension Mother   . HIV/AIDS Father     Past Surgical History:  Procedure Laterality Date  . CESAREAN SECTION  2006  . LACERATION REPAIR Right ~ 1997   "tried to cut myself"  . TUBAL LIGATION  2006   Social History   Occupational History  . Not on file.   Social History Main Topics  .  Smoking status: Former Smoker    Packs/day: 0.25    Years: 20.00    Types: Cigarettes    Quit date: 01/31/2013  . Smokeless tobacco: Never Used  . Alcohol use No     Comment: 01/26/2006 "clean from drinking since 2007"  . Drug use: No     Comment: 01/27/2016 "clean from smoking crack since 2007"  . Sexual activity: Yes    Birth control/ protection: None, Surgical

## 2016-08-24 ENCOUNTER — Ambulatory Visit: Payer: Self-pay | Admitting: Pulmonary Disease

## 2016-08-25 ENCOUNTER — Encounter (HOSPITAL_COMMUNITY): Payer: Self-pay

## 2016-08-25 ENCOUNTER — Encounter (HOSPITAL_COMMUNITY)
Admission: RE | Admit: 2016-08-25 | Discharge: 2016-08-25 | Disposition: A | Discharge status: Home / Self Care | Payer: Medicaid Other | Source: Ambulatory Visit | Attending: Orthopaedic Surgery | Admitting: Orthopaedic Surgery

## 2016-08-25 DIAGNOSIS — Z01812 Encounter for preprocedural laboratory examination: Secondary | ICD-10-CM | POA: Insufficient documentation

## 2016-08-25 HISTORY — DX: Dyspnea, unspecified: R06.00

## 2016-08-25 LAB — COMPREHENSIVE METABOLIC PANEL
ALT: 11 U/L — AB (ref 14–54)
AST: 13 U/L — AB (ref 15–41)
Albumin: 3.4 g/dL — ABNORMAL LOW (ref 3.5–5.0)
Alkaline Phosphatase: 51 U/L (ref 38–126)
Anion gap: 6 (ref 5–15)
BUN: 11 mg/dL (ref 6–20)
CHLORIDE: 108 mmol/L (ref 101–111)
CO2: 25 mmol/L (ref 22–32)
CREATININE: 0.77 mg/dL (ref 0.44–1.00)
Calcium: 9.1 mg/dL (ref 8.9–10.3)
GFR calc Af Amer: >60 mL/min (ref >60)
Glucose, Bld: 84 mg/dL (ref 65–99)
Potassium: 4 mmol/L (ref 3.5–5.1)
Sodium: 139 mmol/L (ref 135–145)
Total Bilirubin: 0.6 mg/dL (ref 0.3–1.2)
Total Protein: 6.9 g/dL (ref 6.5–8.1)

## 2016-08-25 LAB — SURGICAL PCR SCREEN
MRSA, PCR: NEGATIVE
Staphylococcus aureus: NEGATIVE

## 2016-08-25 LAB — CBC WITH DIFFERENTIAL/PLATELET
BASOS ABS: 0 10*3/uL (ref 0.0–0.1)
Basophils Relative: 1 %
EOS PCT: 4 %
Eosinophils Absolute: 0.3 10*3/uL (ref 0.0–0.7)
HCT: 40.3 % (ref 36.0–46.0)
Hemoglobin: 13.2 g/dL (ref 12.0–15.0)
LYMPHS PCT: 48 %
Lymphs Abs: 3 10*3/uL (ref 0.7–4.0)
MCH: 28.2 pg (ref 26.0–34.0)
MCHC: 32.8 g/dL (ref 30.0–36.0)
MCV: 86.1 fL (ref 78.0–100.0)
Monocytes Absolute: 0.6 10*3/uL (ref 0.1–1.0)
Monocytes Relative: 10 %
NEUTROS ABS: 2.3 10*3/uL (ref 1.7–7.7)
NEUTROS PCT: 37 %
PLATELETS: 286 10*3/uL (ref 150–400)
RBC: 4.68 MIL/uL (ref 3.87–5.11)
RDW: 16.4 % — ABNORMAL HIGH (ref 11.5–15.5)
WBC: 6.3 10*3/uL (ref 4.0–10.5)

## 2016-08-25 LAB — PROTIME-INR
INR: 1.02
PROTHROMBIN TIME: 13.4 s (ref 11.4–15.2)

## 2016-08-25 LAB — URINALYSIS, ROUTINE W REFLEX MICROSCOPIC
Bilirubin Urine: NEGATIVE
Glucose, UA: NEGATIVE mg/dL
Hgb urine dipstick: NEGATIVE
KETONES UR: NEGATIVE mg/dL
LEUKOCYTES UA: NEGATIVE
NITRITE: NEGATIVE
PROTEIN: NEGATIVE mg/dL
Specific Gravity, Urine: 1.037 — ABNORMAL HIGH (ref 1.005–1.030)
pH: 6 (ref 5.0–8.0)

## 2016-08-25 LAB — HCG, SERUM, QUALITATIVE: PREG SERUM: NEGATIVE

## 2016-08-25 LAB — TYPE AND SCREEN
ABO/RH(D): A POS
Antibody Screen: NEGATIVE

## 2016-08-25 LAB — C-REACTIVE PROTEIN

## 2016-08-25 LAB — ABO/RH: ABO/RH(D): A POS

## 2016-08-25 LAB — SEDIMENTATION RATE: SED RATE: 27 mm/h — AB (ref 0–22)

## 2016-08-25 LAB — APTT: APTT: 36 s (ref 24–36)

## 2016-08-25 NOTE — Pre-Procedure Instructions (Signed)
Kathleen Rodriguez  08/25/2016      [id number] [place] - [address] [facility] [id number] [place] [address] Phone: [phone] Fax: [phone]  [facility] [place]  [place] [address] Phone: [phone] Fax: [phone]  [facility] - [place], [place] - [address] [place] [id number] Phone: [phone] Fax: [phone]    Your procedure is scheduled on 09/01/2016  [facility] 10:15 A.M.  Call this number if you have problems the morning of surgery:  [phone]   Remember:  Do not eat food or drink liquids after midnight.  On Wednesday  NIGHT  Take these medicines the morning of surgery with A SIP OF WATER : use inhaler if needed, use nebulizer if needed, take pain medicine if needed   Do not wear jewelry, make-up or nail polish.   Do not wear lotions, powders, or perfumes, or deoderant.   Do not shave 48 hours prior to surgery.     Do not bring valuables to the hospital.   [facility] is not responsible for any belongings or valuables.  Contacts, dentures or bridgework may not be worn into surgery.  Leave your suitcase in the car.  After surgery it may be brought to your room.  For patients admitted to the hospital, discharge time will be determined by your treatment team.  Patients discharged the day of surgery will not be allowed to drive home.   Name and phone number of your driver:   With spouse   Special instructions:  Special Instructions: [place] - Preparing for Surgery  Before surgery, you can play an important role.  Because skin is not sterile, your skin needs to be as free of germs as possible.  You can reduce the number of germs on you skin by washing with CHG (chlorahexidine gluconate) soap before surgery.  CHG is an antiseptic cleaner  which kills germs and bonds with the skin to continue killing germs even after washing.  Please DO NOT use if you have an allergy to CHG or antibacterial soaps.  If your skin becomes reddened/irritated stop using the CHG and inform your nurse when you arrive at Short Stay.  Do not shave (including legs and underarms) for at least 48 hours prior to the first CHG shower.  You may shave your face.  Please follow these instructions carefully:   1.  Shower with CHG Soap the night before surgery and the  morning of Surgery.  2.  If you choose to wash your hair, wash your hair first as usual with your  normal shampoo.  3.  After you shampoo, rinse your hair and body thoroughly to remove the  Shampoo.  4.  Use CHG as you would any other liquid soap.  You can apply chg directly to the skin and wash gently with scrungie or a clean washcloth.  5.  Apply the CHG Soap to your body ONLY FROM THE NECK DOWN.    Do not use on open wounds or open sores.  Avoid contact with your eyes, ears, mouth and genitals (private parts).  Wash genitals (private parts)   with your normal soap.  6.  Wash thoroughly, paying special attention to the area where your surgery will be performed.  7.  Thoroughly rinse your body with warm water  from the neck down.  8.  DO NOT shower/wash with your normal soap after using and rinsing off   the CHG Soap.  9.  Pat yourself dry with a clean towel.            10.  Wear clean pajamas.            11.  Place clean sheets on your bed the night of your first shower and do not sleep with pets.  Day of Surgery  Do not apply any lotions/deodorants the morning of surgery.  Please wear clean clothes to the hospital/surgery center.  Please read over the following fact sheets that you were given. Pain Booklet, Coughing and Deep Breathing, Total Joint Packet, MRSA Information and Surgical Site Infection Prevention

## 2016-08-25 NOTE — Progress Notes (Signed)
Pt. Hosp. For resp. Distress 2 months ago.  She was told to have f/u with PCP upon d/c from hosp. & have 2 V. CXR.  This was noted after pt. Left PAT appt. Also pt. Admitted that she is not using the singulair or the prednisone. During visit, pt. Denies any distress with breathing or in her chest in any way. She did not cough at all while in the PAT visit until she remarked that she felt warm & then she did some coughing & it was noted that it was quite "wet" sounding. She says she uses the CPAP q night, denies that she uses O2.  The chart will be given to the Anesth. Dept. For their review.

## 2016-08-25 NOTE — Progress Notes (Signed)
Pt. Reports that she is still smoking cigarettes, approx. 5 per day.

## 2016-08-26 NOTE — Progress Notes (Signed)
Anesthesia Chart Review:  Pt is a 50 year old female scheduled for L total knee arthroplasty on 09/01/2016 with Gershon MusselNaiping Xu, MD.   - PCP is Jaclyn ShaggyEnobong Amao, MD, last office visit 07/27/16.  - Pulmonologist is Coralyn HellingVineet Sood, MD, last office visit 06/29/16 with Canary BrimBrandi Ollis, NP.   PMH includes:  CHF, HTN, asthma, hx cocaine abuse, OSA, GERD. Former smoker. BMI 43.5  - Hospitalized 8/31-06/26/16 for acute asthma exacerbation  Medications include: albuterol, pulmicort  Preoperative labs reviewed.    1 view CXR 06/24/16: No active disease.  EKG 06/23/16: sinus rhythm  Echo 04/06/14:  - Left ventricle: The cavity size was normal. Wall thickness was increased in a pattern of moderate LVH. Systolic function was vigorous. The estimated ejection fraction was in the range of 65% to 70%. Wall motion was normal; there were no regional wall motion abnormalities. - Left atrium: The atrium was mildly dilated. - Right ventricle: The cavity size was normal. Systolic function was normal. - Impressions: The study is technically very limited. There is tachycardia with vigorous LV function. There is no TR recorded. So RV pressure can not be estimated. However, the RV is of normal size with good RV motion. The IVC does appear dilated.  If no changes, I anticipate pt can proceed with surgery as scheduled.   Rica Mastngela Vikash Nest, FNP-BC [facility]/Anesthesiology Phone: [phone] 08/26/2016 2:16 PM

## 2016-08-31 MED ORDER — TRANEXAMIC ACID 1000 MG/10ML IV SOLN
1000.0000 mg | INTRAVENOUS | Status: AC
  Administered 2016-09-01: 1000 mg via INTRAVENOUS
  Filled 2016-08-31: qty 10

## 2016-08-31 MED ORDER — DEXTROSE 5 % IV SOLN
3.0000 g | INTRAVENOUS | Status: AC
  Administered 2016-09-01: 3 g via INTRAVENOUS
  Filled 2016-08-31: qty 3000

## 2016-09-01 ENCOUNTER — Inpatient Hospital Stay (HOSPITAL_COMMUNITY)
Admission: RE | Admit: 2016-09-01 | Discharge: 2016-09-03 | DRG: 470 | Disposition: A | Discharge status: Home Health | Payer: Self-pay | Source: Ambulatory Visit | Attending: Orthopaedic Surgery | Admitting: Orthopaedic Surgery

## 2016-09-01 ENCOUNTER — Inpatient Hospital Stay (HOSPITAL_COMMUNITY): Payer: Self-pay | Admitting: Emergency Medicine

## 2016-09-01 ENCOUNTER — Inpatient Hospital Stay (HOSPITAL_COMMUNITY): Payer: Self-pay

## 2016-09-01 ENCOUNTER — Encounter (HOSPITAL_COMMUNITY): Payer: Self-pay | Admitting: General Practice

## 2016-09-01 ENCOUNTER — Encounter (HOSPITAL_COMMUNITY)
Admission: RE | Disposition: A | Discharge status: Home Health | Payer: Self-pay | Source: Ambulatory Visit | Attending: Orthopaedic Surgery

## 2016-09-01 ENCOUNTER — Inpatient Hospital Stay (HOSPITAL_COMMUNITY): Payer: Self-pay | Admitting: Anesthesiology

## 2016-09-01 DIAGNOSIS — I10 Essential (primary) hypertension: Secondary | ICD-10-CM | POA: Diagnosis present

## 2016-09-01 DIAGNOSIS — Z7951 Long term (current) use of inhaled steroids: Secondary | ICD-10-CM

## 2016-09-01 DIAGNOSIS — Z96652 Presence of left artificial knee joint: Secondary | ICD-10-CM

## 2016-09-01 DIAGNOSIS — R262 Difficulty in walking, not elsewhere classified: Secondary | ICD-10-CM

## 2016-09-01 DIAGNOSIS — M1712 Unilateral primary osteoarthritis, left knee: Principal | ICD-10-CM | POA: Diagnosis present

## 2016-09-01 DIAGNOSIS — E669 Obesity, unspecified: Secondary | ICD-10-CM | POA: Diagnosis present

## 2016-09-01 DIAGNOSIS — J45909 Unspecified asthma, uncomplicated: Secondary | ICD-10-CM | POA: Diagnosis present

## 2016-09-01 DIAGNOSIS — Z87891 Personal history of nicotine dependence: Secondary | ICD-10-CM

## 2016-09-01 DIAGNOSIS — M25662 Stiffness of left knee, not elsewhere classified: Secondary | ICD-10-CM

## 2016-09-01 DIAGNOSIS — Z6841 Body Mass Index (BMI) 40.0 and over, adult: Secondary | ICD-10-CM

## 2016-09-01 DIAGNOSIS — Z96659 Presence of unspecified artificial knee joint: Secondary | ICD-10-CM

## 2016-09-01 HISTORY — PX: TOTAL KNEE ARTHROPLASTY: SHX125

## 2016-09-01 SURGERY — ARTHROPLASTY, KNEE, TOTAL
Anesthesia: Spinal | Site: Knee | Laterality: Left

## 2016-09-01 MED ORDER — METOCLOPRAMIDE HCL 5 MG PO TABS
5.0000 mg | ORAL_TABLET | Freq: Three times a day (TID) | ORAL | Status: DC | PRN
  Filled 2016-09-01: qty 2

## 2016-09-01 MED ORDER — ROPIVACAINE HCL 7.5 MG/ML IJ SOLN
INTRAMUSCULAR | Status: DC | PRN
  Administered 2016-09-01: 20 mL via PERINEURAL

## 2016-09-01 MED ORDER — POVIDONE-IODINE 10 % EX SOLN
CUTANEOUS | Status: DC | PRN
  Administered 2016-09-01: 1 via TOPICAL

## 2016-09-01 MED ORDER — PROMETHAZINE HCL 25 MG/ML IJ SOLN: 6.2500 mg | INTRAMUSCULAR | Status: DC | PRN

## 2016-09-01 MED ORDER — OXYCODONE HCL ER 10 MG PO T12A
10.0000 mg | EXTENDED_RELEASE_TABLET | Freq: Two times a day (BID) | ORAL | Status: DC
Start: 2016-09-01 — End: 2016-09-03
  Administered 2016-09-01 – 2016-09-03 (×4): 10 mg via ORAL
  Filled 2016-09-01 (×4): qty 1

## 2016-09-01 MED ORDER — ACETAMINOPHEN 650 MG RE SUPP: 650.0000 mg | Freq: Four times a day (QID) | RECTAL | Status: DC | PRN

## 2016-09-01 MED ORDER — LACTATED RINGERS IV SOLN: INTRAVENOUS | Status: DC

## 2016-09-01 MED ORDER — PHENYLEPHRINE HCL 10 MG/ML IJ SOLN
INTRAMUSCULAR | Status: DC | PRN
  Administered 2016-09-01: 200 ug via INTRAVENOUS
  Administered 2016-09-01: 120 ug via INTRAVENOUS
  Administered 2016-09-01: 80 ug via INTRAVENOUS

## 2016-09-01 MED ORDER — PHENYLEPHRINE 40 MCG/ML (10ML) SYRINGE FOR IV PUSH (FOR BLOOD PRESSURE SUPPORT)
PREFILLED_SYRINGE | INTRAVENOUS | Status: AC
Start: 2016-09-01 — End: 2016-09-01
  Filled 2016-09-01: qty 10

## 2016-09-01 MED ORDER — PHENYLEPHRINE HCL 10 MG/ML IJ SOLN
INTRAMUSCULAR | Status: DC | PRN
  Administered 2016-09-01: 50 ug/min via INTRAVENOUS

## 2016-09-01 MED ORDER — ALBUTEROL SULFATE HFA 108 (90 BASE) MCG/ACT IN AERS: 2.0000 | INHALATION_SPRAY | Freq: Four times a day (QID) | RESPIRATORY_TRACT | Status: DC | PRN

## 2016-09-01 MED ORDER — MENTHOL 3 MG MT LOZG
1.0000 | LOZENGE | OROMUCOSAL | Status: DC | PRN
  Filled 2016-09-01: qty 9

## 2016-09-01 MED ORDER — ACETAMINOPHEN 500 MG PO TABS
1000.0000 mg | ORAL_TABLET | Freq: Four times a day (QID) | ORAL | Status: AC
  Administered 2016-09-01 – 2016-09-02 (×4): 1000 mg via ORAL
  Filled 2016-09-01 (×5): qty 2

## 2016-09-01 MED ORDER — LACTATED RINGERS IV SOLN
INTRAVENOUS | Status: DC
  Administered 2016-09-01 (×2): via INTRAVENOUS

## 2016-09-01 MED ORDER — FENTANYL CITRATE (PF) 100 MCG/2ML IJ SOLN
INTRAMUSCULAR | Status: AC
  Filled 2016-09-01: qty 2

## 2016-09-01 MED ORDER — SULFAMETHOXAZOLE-TRIMETHOPRIM 800-160 MG PO TABS: 1.0000 | ORAL_TABLET | Freq: Two times a day (BID) | ORAL | 0 refills | Status: DC

## 2016-09-01 MED ORDER — SODIUM CHLORIDE 0.9 % IV SOLN
1000.0000 mg | Freq: Once | INTRAVENOUS | Status: AC
  Administered 2016-09-02: 1000 mg via INTRAVENOUS
  Filled 2016-09-01: qty 10

## 2016-09-01 MED ORDER — BUDESONIDE 0.25 MG/2ML IN SUSP: 0.2500 mg | Freq: Two times a day (BID) | RESPIRATORY_TRACT | Status: DC | PRN

## 2016-09-01 MED ORDER — CHLORHEXIDINE GLUCONATE 4 % EX LIQD: 60.0000 mL | Freq: Once | CUTANEOUS | Status: DC

## 2016-09-01 MED ORDER — MEPERIDINE HCL 25 MG/ML IJ SOLN
INTRAMUSCULAR | Status: AC
  Filled 2016-09-01: qty 1

## 2016-09-01 MED ORDER — DIPHENHYDRAMINE HCL 12.5 MG/5ML PO ELIX: 25.0000 mg | ORAL_SOLUTION | ORAL | Status: DC | PRN

## 2016-09-01 MED ORDER — SORBITOL 70 % SOLN: 30.0000 mL | Freq: Every day | Status: DC | PRN

## 2016-09-01 MED ORDER — METHOCARBAMOL 750 MG PO TABS: 750.0000 mg | ORAL_TABLET | Freq: Two times a day (BID) | ORAL | 0 refills | Status: DC | PRN

## 2016-09-01 MED ORDER — MORPHINE SULFATE (PF) 2 MG/ML IV SOLN
1.0000 mg | INTRAVENOUS | Status: DC | PRN
  Administered 2016-09-01 – 2016-09-03 (×4): 1 mg via INTRAVENOUS
  Filled 2016-09-01 (×4): qty 1

## 2016-09-01 MED ORDER — METHOCARBAMOL 1000 MG/10ML IJ SOLN
500.0000 mg | Freq: Four times a day (QID) | INTRAVENOUS | Status: DC | PRN
  Filled 2016-09-01: qty 5

## 2016-09-01 MED ORDER — 0.9 % SODIUM CHLORIDE (POUR BTL) OPTIME
TOPICAL | Status: DC | PRN
Start: 2016-09-01 — End: 2016-09-01
  Administered 2016-09-01: 1000 mL

## 2016-09-01 MED ORDER — SODIUM CHLORIDE 0.9 % IV SOLN
INTRAVENOUS | Status: DC | PRN
  Administered 2016-09-01: 60 mL

## 2016-09-01 MED ORDER — MIDAZOLAM HCL 2 MG/2ML IJ SOLN
INTRAMUSCULAR | Status: AC
  Filled 2016-09-01: qty 2

## 2016-09-01 MED ORDER — KETOROLAC TROMETHAMINE 30 MG/ML IJ SOLN
30.0000 mg | Freq: Four times a day (QID) | INTRAMUSCULAR | Status: DC | PRN
  Administered 2016-09-02: 30 mg via INTRAVENOUS
  Filled 2016-09-01 (×2): qty 1

## 2016-09-01 MED ORDER — OXYCODONE HCL 5 MG PO TABS
5.0000 mg | ORAL_TABLET | ORAL | Status: DC | PRN
  Administered 2016-09-01: 15 mg via ORAL
  Administered 2016-09-02: 10 mg via ORAL
  Administered 2016-09-02 – 2016-09-03 (×4): 15 mg via ORAL
  Filled 2016-09-01 (×4): qty 3
  Filled 2016-09-01: qty 2
  Filled 2016-09-01: qty 3

## 2016-09-01 MED ORDER — ROCURONIUM BROMIDE 10 MG/ML (PF) SYRINGE
PREFILLED_SYRINGE | INTRAVENOUS | Status: AC
  Filled 2016-09-01: qty 10

## 2016-09-01 MED ORDER — BUPIVACAINE IN DEXTROSE 0.75-8.25 % IT SOLN
INTRATHECAL | Status: DC | PRN
Start: 2016-09-01 — End: 2016-09-01
  Administered 2016-09-01: 2 mL via INTRATHECAL

## 2016-09-01 MED ORDER — CEFAZOLIN SODIUM 10 G IJ SOLR
3.0000 g | Freq: Four times a day (QID) | INTRAMUSCULAR | Status: AC
  Administered 2016-09-01 – 2016-09-02 (×2): 3 g via INTRAVENOUS
  Filled 2016-09-01 (×2): qty 3000

## 2016-09-01 MED ORDER — MEPERIDINE HCL 25 MG/ML IJ SOLN
6.2500 mg | INTRAMUSCULAR | Status: DC | PRN
  Administered 2016-09-01: 12.5 mg via INTRAVENOUS

## 2016-09-01 MED ORDER — OXYCODONE HCL ER 10 MG PO T12A: 10.0000 mg | EXTENDED_RELEASE_TABLET | Freq: Two times a day (BID) | ORAL | 0 refills | Status: DC

## 2016-09-01 MED ORDER — SODIUM CHLORIDE 0.9 % IR SOLN
Status: DC | PRN
  Administered 2016-09-01: 3000 mL

## 2016-09-01 MED ORDER — TRANEXAMIC ACID 1000 MG/10ML IV SOLN
INTRAVENOUS | Status: DC | PRN
  Administered 2016-09-01: 2000 mg via TOPICAL

## 2016-09-01 MED ORDER — SENNOSIDES-DOCUSATE SODIUM 8.6-50 MG PO TABS: 1.0000 | ORAL_TABLET | Freq: Every evening | ORAL | 1 refills | Status: DC | PRN

## 2016-09-01 MED ORDER — ONDANSETRON HCL 4 MG PO TABS: 4.0000 mg | ORAL_TABLET | Freq: Three times a day (TID) | ORAL | 0 refills | Status: DC | PRN

## 2016-09-01 MED ORDER — MAGNESIUM CITRATE PO SOLN: 1.0000 | Freq: Once | ORAL | Status: DC | PRN

## 2016-09-01 MED ORDER — FENTANYL CITRATE (PF) 100 MCG/2ML IJ SOLN
50.0000 ug | Freq: Once | INTRAMUSCULAR | Status: AC
  Administered 2016-09-01: 50 ug via INTRAVENOUS

## 2016-09-01 MED ORDER — TRANEXAMIC ACID 1000 MG/10ML IV SOLN
2000.0000 mg | Freq: Once | INTRAVENOUS | Status: DC
  Filled 2016-09-01: qty 20

## 2016-09-01 MED ORDER — SULFAMETHOXAZOLE-TRIMETHOPRIM 800-160 MG PO TABS
1.0000 | ORAL_TABLET | Freq: Two times a day (BID) | ORAL | Status: DC
  Administered 2016-09-01 – 2016-09-03 (×4): 1 via ORAL
  Filled 2016-09-01 (×4): qty 1

## 2016-09-01 MED ORDER — PROPOFOL 500 MG/50ML IV EMUL
INTRAVENOUS | Status: DC | PRN
  Administered 2016-09-01: 50 ug/kg/min via INTRAVENOUS

## 2016-09-01 MED ORDER — SODIUM CHLORIDE 0.9 % IV SOLN: INTRAVENOUS | Status: DC

## 2016-09-01 MED ORDER — PROPOFOL 10 MG/ML IV BOLUS
INTRAVENOUS | Status: AC
  Filled 2016-09-01: qty 20

## 2016-09-01 MED ORDER — BUPIVACAINE LIPOSOME 1.3 % IJ SUSP
20.0000 mL | INTRAMUSCULAR | Status: DC
  Filled 2016-09-01: qty 20

## 2016-09-01 MED ORDER — ONDANSETRON HCL 4 MG/2ML IJ SOLN
INTRAMUSCULAR | Status: AC
  Filled 2016-09-01: qty 2

## 2016-09-01 MED ORDER — LIDOCAINE HCL (CARDIAC) 20 MG/ML IV SOLN
INTRAVENOUS | Status: DC | PRN
  Administered 2016-09-01: 20 mg via INTRATRACHEAL

## 2016-09-01 MED ORDER — DEXAMETHASONE SODIUM PHOSPHATE 10 MG/ML IJ SOLN
10.0000 mg | Freq: Once | INTRAMUSCULAR | Status: AC
  Administered 2016-09-02: 10 mg via INTRAVENOUS
  Filled 2016-09-01: qty 1

## 2016-09-01 MED ORDER — LIDOCAINE 2% (20 MG/ML) 5 ML SYRINGE
INTRAMUSCULAR | Status: AC
  Filled 2016-09-01: qty 5

## 2016-09-01 MED ORDER — ASPIRIN EC 325 MG PO TBEC: 325.0000 mg | DELAYED_RELEASE_TABLET | Freq: Two times a day (BID) | ORAL | 0 refills | Status: DC

## 2016-09-01 MED ORDER — PHENOL 1.4 % MT LIQD: 1.0000 | OROMUCOSAL | Status: DC | PRN

## 2016-09-01 MED ORDER — OXYCODONE HCL 5 MG PO TABS
ORAL_TABLET | ORAL | Status: AC
  Administered 2016-09-01: 15 mg via ORAL
  Filled 2016-09-01: qty 3

## 2016-09-01 MED ORDER — OXYCODONE HCL 5 MG PO TABS: 5.0000 mg | ORAL_TABLET | ORAL | 0 refills | Status: DC | PRN

## 2016-09-01 MED ORDER — PHENYLEPHRINE HCL 10 MG/ML IJ SOLN
INTRAMUSCULAR | Status: AC
  Filled 2016-09-01: qty 1

## 2016-09-01 MED ORDER — ACETAMINOPHEN 325 MG PO TABS
650.0000 mg | ORAL_TABLET | Freq: Four times a day (QID) | ORAL | Status: DC | PRN
Start: 2016-09-01 — End: 2016-09-03

## 2016-09-01 MED ORDER — ONDANSETRON HCL 4 MG/2ML IJ SOLN
4.0000 mg | Freq: Four times a day (QID) | INTRAMUSCULAR | Status: DC | PRN
  Filled 2016-09-01: qty 2

## 2016-09-01 MED ORDER — METHOCARBAMOL 500 MG PO TABS
500.0000 mg | ORAL_TABLET | Freq: Four times a day (QID) | ORAL | Status: DC | PRN
  Administered 2016-09-01 – 2016-09-03 (×5): 500 mg via ORAL
  Filled 2016-09-01 (×6): qty 1

## 2016-09-01 MED ORDER — ALUM & MAG HYDROXIDE-SIMETH 200-200-20 MG/5ML PO SUSP: 30.0000 mL | ORAL | Status: DC | PRN

## 2016-09-01 MED ORDER — ALBUTEROL SULFATE (2.5 MG/3ML) 0.083% IN NEBU: 2.5000 mg | INHALATION_SOLUTION | RESPIRATORY_TRACT | Status: DC | PRN

## 2016-09-01 MED ORDER — FLUTICASONE PROPIONATE 50 MCG/ACT NA SUSP
2.0000 | Freq: Every day | NASAL | Status: DC
  Administered 2016-09-02 – 2016-09-03 (×2): 2 via NASAL
  Filled 2016-09-01: qty 16

## 2016-09-01 MED ORDER — HYDROMORPHONE HCL 2 MG/ML IJ SOLN
INTRAMUSCULAR | Status: AC
  Filled 2016-09-01: qty 1

## 2016-09-01 MED ORDER — ASPIRIN EC 325 MG PO TBEC
325.0000 mg | DELAYED_RELEASE_TABLET | Freq: Two times a day (BID) | ORAL | Status: DC
  Administered 2016-09-01 – 2016-09-03 (×4): 325 mg via ORAL
  Filled 2016-09-01 (×4): qty 1

## 2016-09-01 MED ORDER — HYDROMORPHONE HCL 1 MG/ML IJ SOLN
0.2500 mg | INTRAMUSCULAR | Status: DC | PRN
  Administered 2016-09-01 (×4): 0.5 mg via INTRAVENOUS

## 2016-09-01 MED ORDER — ONDANSETRON HCL 4 MG PO TABS: 4.0000 mg | ORAL_TABLET | Freq: Four times a day (QID) | ORAL | Status: DC | PRN

## 2016-09-01 MED ORDER — METOCLOPRAMIDE HCL 5 MG/ML IJ SOLN
5.0000 mg | Freq: Three times a day (TID) | INTRAMUSCULAR | Status: DC | PRN
  Administered 2016-09-01: 10 mg via INTRAVENOUS
  Filled 2016-09-01 (×2): qty 2

## 2016-09-01 MED ORDER — ONDANSETRON HCL 4 MG/2ML IJ SOLN
INTRAMUSCULAR | Status: DC | PRN
Start: 2016-09-01 — End: 2016-09-01
  Administered 2016-09-01: 4 mg via INTRAVENOUS

## 2016-09-01 MED ORDER — POLYETHYLENE GLYCOL 3350 17 G PO PACK: 17.0000 g | PACK | Freq: Every day | ORAL | Status: DC | PRN

## 2016-09-01 MED ORDER — MIDAZOLAM HCL 2 MG/2ML IJ SOLN
2.0000 mg | Freq: Once | INTRAMUSCULAR | Status: AC
  Administered 2016-09-01: 2 mg via INTRAVENOUS

## 2016-09-01 MED ORDER — NYSTATIN 100000 UNIT/GM EX CREA
TOPICAL_CREAM | Freq: Two times a day (BID) | CUTANEOUS | Status: DC
  Administered 2016-09-01 – 2016-09-03 (×3): via TOPICAL
  Filled 2016-09-01: qty 15

## 2016-09-01 SURGICAL SUPPLY — 66 items
ALCOHOL ISOPROPYL (RUBBING) (MISCELLANEOUS) ×3 IMPLANT
BAG DECANTER FOR FLEXI CONT (MISCELLANEOUS) ×3 IMPLANT
BANDAGE ACE 6X5 VEL STRL LF (GAUZE/BANDAGES/DRESSINGS) ×6 IMPLANT
BANDAGE ESMARK 6X9 LF (GAUZE/BANDAGES/DRESSINGS) ×1 IMPLANT
BENZOIN TINCTURE PRP APPL 2/3 (GAUZE/BANDAGES/DRESSINGS) ×3 IMPLANT
BLADE SAW SGTL 13.0X1.19X90.0M (BLADE) ×3 IMPLANT
BNDG ESMARK 6X9 LF (GAUZE/BANDAGES/DRESSINGS) ×3
BONE CEMENT PALACOS R-G (Orthopedic Implant) ×6 IMPLANT
BOWL SMART MIX CTS (DISPOSABLE) ×3 IMPLANT
CAPT KNEE TOTAL 3 ×3 IMPLANT
CEMENT BONE PALACOS R-G (Orthopedic Implant) ×2 IMPLANT
CLOSURE STERI-STRIP 1/2X4 (GAUZE/BANDAGES/DRESSINGS) ×1
CLSR STERI-STRIP ANTIMIC 1/2X4 (GAUZE/BANDAGES/DRESSINGS) ×2 IMPLANT
COVER SURGICAL LIGHT HANDLE (MISCELLANEOUS) ×3 IMPLANT
CUFF TOURNIQUET SINGLE 34IN LL (TOURNIQUET CUFF) ×3 IMPLANT
CUFF TOURNIQUET SINGLE 44IN (TOURNIQUET CUFF) IMPLANT
DRAPE HALF SHEET 40X57 (DRAPES) ×3 IMPLANT
DRAPE INCISE IOBAN 66X45 STRL (DRAPES) IMPLANT
DRAPE ORTHO SPLIT 77X108 STRL (DRAPES) ×4
DRAPE PROXIMA HALF (DRAPES) ×3 IMPLANT
DRAPE SURG 17X11 SM STRL (DRAPES) ×6 IMPLANT
DRAPE SURG ORHT 6 SPLT 77X108 (DRAPES) ×2 IMPLANT
DRESSING AQUACEL AG SP 3.5X10 (GAUZE/BANDAGES/DRESSINGS) ×1 IMPLANT
DRSG AQUACEL AG ADV 3.5X14 (GAUZE/BANDAGES/DRESSINGS) ×3 IMPLANT
DRSG AQUACEL AG SP 3.5X10 (GAUZE/BANDAGES/DRESSINGS) ×3
DURAPREP 26ML APPLICATOR (WOUND CARE) ×9 IMPLANT
ELECT CAUTERY BLADE 6.4 (BLADE) ×3 IMPLANT
ELECT REM PT RETURN 9FT ADLT (ELECTROSURGICAL) ×3
ELECTRODE REM PT RTRN 9FT ADLT (ELECTROSURGICAL) ×1 IMPLANT
GLOVE SKINSENSE NS SZ7.5 (GLOVE) ×2
GLOVE SKINSENSE STRL SZ7.5 (GLOVE) ×1 IMPLANT
GLOVE SURG SYN 7.5  E (GLOVE) ×8
GLOVE SURG SYN 7.5 E (GLOVE) ×4 IMPLANT
GOWN STRL REIN XL XLG (GOWN DISPOSABLE) ×3 IMPLANT
GOWN STRL REUS W/ TWL LRG LVL3 (GOWN DISPOSABLE) ×1 IMPLANT
GOWN STRL REUS W/TWL LRG LVL3 (GOWN DISPOSABLE) ×2
HANDPIECE INTERPULSE COAX TIP (DISPOSABLE) ×2
HOOD PEEL AWAY FLYTE STAYCOOL (MISCELLANEOUS) ×6 IMPLANT
KIT BASIN OR (CUSTOM PROCEDURE TRAY) ×3 IMPLANT
KIT ROOM TURNOVER OR (KITS) ×3 IMPLANT
MANIFOLD NEPTUNE II (INSTRUMENTS) ×3 IMPLANT
NEEDLE SPNL 18GX3.5 QUINCKE PK (NEEDLE) ×3 IMPLANT
NS IRRIG 1000ML POUR BTL (IV SOLUTION) ×3 IMPLANT
PACK TOTAL JOINT (CUSTOM PROCEDURE TRAY) ×3 IMPLANT
PAD ARMBOARD 7.5X6 YLW CONV (MISCELLANEOUS) ×6 IMPLANT
PEN SKIN MARKING BROAD (MISCELLANEOUS) ×3 IMPLANT
SAW OSC TIP CART 19.5X105X1.3 (SAW) ×3 IMPLANT
SEALER BIPOLAR AQUA 6.0 (INSTRUMENTS) ×3 IMPLANT
SET HNDPC FAN SPRY TIP SCT (DISPOSABLE) ×1 IMPLANT
SPONGE GAUZE 4X4 12PLY STER LF (GAUZE/BANDAGES/DRESSINGS) ×3 IMPLANT
STAPLER VISISTAT 35W (STAPLE) IMPLANT
SUCTION FRAZIER HANDLE 10FR (MISCELLANEOUS) ×2
SUCTION TUBE FRAZIER 10FR DISP (MISCELLANEOUS) ×1 IMPLANT
SUT ETHILON 2 0 FS 18 (SUTURE) IMPLANT
SUT MNCRL AB 4-0 PS2 18 (SUTURE) IMPLANT
SUT VIC AB 0 CT1 27 (SUTURE) ×4
SUT VIC AB 0 CT1 27XBRD ANBCTR (SUTURE) ×2 IMPLANT
SUT VIC AB 1 CTX 27 (SUTURE) ×9 IMPLANT
SUT VIC AB 2-0 CT1 27 (SUTURE) ×6
SUT VIC AB 2-0 CT1 TAPERPNT 27 (SUTURE) ×3 IMPLANT
SYR 20CC LL (SYRINGE) ×3 IMPLANT
SYR 50ML LL SCALE MARK (SYRINGE) ×3 IMPLANT
TOWEL OR 17X24 6PK STRL BLUE (TOWEL DISPOSABLE) ×3 IMPLANT
TOWEL OR 17X26 10 PK STRL BLUE (TOWEL DISPOSABLE) ×3 IMPLANT
TRAY CATH 16FR W/PLASTIC CATH (SET/KITS/TRAYS/PACK) IMPLANT
WRAP KNEE MAXI GEL POST OP (GAUZE/BANDAGES/DRESSINGS) ×3 IMPLANT

## 2016-09-01 NOTE — Anesthesia Procedure Notes (Signed)
Spinal  Patient location during procedure: OR Start time: 09/01/2016 12:31 PM End time: 09/01/2016 12:33 PM Staffing Anesthesiologist: Suella Broad D Performed: anesthesiologist  Preanesthetic Checklist Completed: patient identified, site marked, surgical consent, pre-op evaluation, timeout performed, IV checked, risks and benefits discussed and monitors and equipment checked Spinal Block Patient position: sitting Prep: Betadine Patient monitoring: heart rate, continuous pulse ox, blood pressure and cardiac monitor Approach: midline Location: L4-5 Injection technique: single-shot Needle Needle type: Introducer and Pencan  Needle gauge: 24 G Needle length: 9 cm Additional Notes Negative paresthesia. Negative blood return. Positive free-flowing CSF. Expiration date of kit checked and confirmed. Patient tolerated procedure well, without complications.

## 2016-09-01 NOTE — Discharge Instructions (Signed)

## 2016-09-01 NOTE — Progress Notes (Signed)
PT Cancellation Note  Patient Details Name: Kathleen Rodriguez MRN: [id number] DOB: 1966-02-24   Cancelled Treatment:    Reason Eval/Treat Not Completed: Other (comment).  Orders received. Op note states start PT/OT POD #1.  PT will check back tomorrow AM.    Thanks,    Rollene Rotundaebecca B. Editha Bridgeforth, PT, DPT [phone]   09/01/2016, 5:22 PM

## 2016-09-01 NOTE — H&P (Signed)
PREOPERATIVE H&P  Chief Complaint: left knee degenerative joint disease  HPI: Kathleen Rodriguez is a 50 y.o. female who presents for surgical treatment of left knee degenerative joint disease.  She denies any changes in medical history.  Past Medical History:  Diagnosis Date  . Arthritis    "knees, lower back; legs, ankles" (01/27/2016)  . Asthma    followed by Dr. Craige CottaSood  . CHF (congestive heart failure) (HCC) 2016   "when I went into a coma"  . Cocaine abuse   . 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 Dyspnea   . GERD (gastroesophageal reflux disease)   . Hypertension    "doctor took me off RX in 2016" (01/27/2016)  . Influenza B April 2014   Complicated by multi-organ failure  . OSA on CPAP "since " 03/20/2013  . Pneumonia 2016  . Required emergent intubation    asthma exacerbation in 2016  . Tobacco abuse    Past Surgical History:  Procedure Laterality Date  . BREAST SURGERY Right 1980   as a teenager , cyst was benign  . CESAREAN SECTION  2006  . LACERATION REPAIR Right ~ 1997   "tried to cut myself"  . TUBAL LIGATION  2006   Social History   Social History  . Marital status: Married    Spouse name: N/A  . Number of children: N/A  . Years of education: N/A   Social History Main Topics  . Smoking status: Former Smoker    Packs/day: 0.25    Years: 20.00    Types: Cigarettes    Quit date: 01/31/2013  . Smokeless tobacco: Never Used  . Alcohol use No     Comment: 01/26/2006 "clean from drinking since 2007"  . Drug use: No     Comment: 01/27/2016 "clean from smoking crack since 2007"  . Sexual activity: Yes    Birth control/ protection: None, Surgical   Other Topics Concern  . None   Social History Narrative  . None   Family History  Problem Relation Age of Onset  . Hypertension Mother   . HIV/AIDS Father    Allergies  Allergen Reactions  . Latex Itching and Rash  . Tomato Hives and Itching    ALSO REACTS TO KETCHUP  . Wool Alcohol [Lanolin] Itching     Prior to Admission medications   Medication Sig Start Date End Date Taking? Authorizing Provider  acetaminophen (TYLENOL) 500 MG tablet Take 1,000 mg by mouth every 6 (six) hours as needed for moderate pain.   Yes Historical Provider, MD  albuterol (PROVENTIL) (2.5 MG/3ML) 0.083% nebulizer solution Take 3 mLs (2.5 mg total) by nebulization every 2 (two) hours as needed for wheezing or shortness of breath. 03/09/16  Yes Tammy S Parrett, NP  albuterol (VENTOLIN HFA) 108 (90 Base) MCG/ACT inhaler Inhale 2 puffs into the lungs every 6 (six) hours as needed for wheezing. 07/27/16  Yes Jaclyn ShaggyEnobong Amao, MD  budesonide (PULMICORT) 0.25 MG/2ML nebulizer solution Take 2 mLs (0.25 mg total) by nebulization 2 (two) times daily. Patient taking differently: Take 0.25 mg by nebulization 2 (two) times daily as needed (shortness of breath).  03/09/16  Yes Tammy S Parrett, NP  fluticasone (FLONASE) 50 MCG/ACT nasal spray Place 2 sprays into the nose daily. Patient taking differently: Place 2 sprays into the nose daily as needed for allergies.  02/21/13  Yes Bernadene PersonKathryn A Whiteheart, NP  HYDROcodone-acetaminophen (NORCO) 5-325 MG tablet Take 1 tablet by mouth 2 (two) times daily  as needed. 08/18/16  Yes Charolett Yarrow Donnelly StagerM Vira Chaplin, MD  acetaminophen-codeine (TYLENOL #3) 300-30 MG tablet Take 1 tablet by mouth every 12 (twelve) hours as needed for moderate pain. Patient not taking: Reported on 08/22/2016 07/27/16   Jaclyn ShaggyEnobong Amao, MD     Positive ROS: All other systems have been reviewed and were otherwise negative with the exception of those mentioned in the HPI and as above.  Physical Exam: General: Alert, no acute distress Cardiovascular: No pedal edema Respiratory: No cyanosis, no use of accessory musculature GI: abdomen soft Skin: No lesions in the area of chief complaint Neurologic: Sensation intact distally Psychiatric: Patient is competent for consent with normal mood and affect Lymphatic: no lymphedema  MUSCULOSKELETAL: exam  stable  Assessment: left knee degenerative joint disease  Plan: Plan for Procedure(s): LEFT TOTAL KNEE ARTHROPLASTY  The risks benefits and alternatives were discussed with the patient including but not limited to the risks of nonoperative treatment, versus surgical intervention including infection, bleeding, nerve injury,  blood clots, cardiopulmonary complications, morbidity, mortality, among others, and they were willing to proceed.   Glee ArvinMichael Irine Heminger, MD   09/01/2016 10:33 AM

## 2016-09-01 NOTE — Anesthesia Procedure Notes (Signed)
Procedure Name: MAC Date/Time: 09/01/2016 12:34 PM Performed by: De NurseENNIE, Willim Turnage E Pre-anesthesia Checklist: Patient identified, Emergency Drugs available, Suction available, Patient being monitored and Timeout performed Patient Re-evaluated:Patient Re-evaluated prior to inductionOxygen Delivery Method: Simple face mask

## 2016-09-01 NOTE — Progress Notes (Signed)
Orthopedic Tech Progress Note Patient Details:  Kathleen Rodriguez Pulse April 19, 1966 [name]  Patient ID: Kathleen AgeLouise Rodriguez Rodriguez, female   DOB: April 19, 1966, 50 y.o.   MRN: [id number]   Saul FordyceJennifer C Lamichael Youkhana 09/01/2016, 3:45 PMOrtho is out of bone foam at this time.

## 2016-09-01 NOTE — Anesthesia Procedure Notes (Signed)
Anesthesia Regional Block:  Adductor canal block  Pre-Anesthetic Checklist: ,, timeout performed, Correct Patient, Correct Site, Correct Laterality, Correct Procedure, Correct Position, site marked, Risks and benefits discussed,  Surgical consent,  Pre-op evaluation,  At surgeon's request and post-op pain management  Laterality: Left  Prep: chloraprep       Needles:  Injection technique: Single-shot  Needle Type: Echogenic Needle     Needle Length: 9cm 9 cm Needle Gauge: 21 and 21 G    Additional Needles:  Procedures: ultrasound guided (picture in chart) Adductor canal block Narrative:  Start time: 09/01/2016 11:54 AM End time: 09/01/2016 11:57 AM Injection made incrementally with aspirations every 5 mL.  Performed by: Personally  Anesthesiologist: Shona SimpsonHOLLIS, Laelani Vasko D  Additional Notes: Tolerated well.

## 2016-09-01 NOTE — Transfer of Care (Signed)
Immediate Anesthesia Transfer of Care Note  Patient: Kathleen Rodriguez  Procedure(s) Performed: Procedure(s): LEFT TOTAL KNEE ARTHROPLASTY (Left)  Patient Location: PACU  Anesthesia Type:MAC and Spinal  Level of Consciousness: awake, alert  and oriented  Airway & Oxygen Therapy: Patient Spontanous Breathing  Post-op Assessment: Report given to RN  Post vital signs: Reviewed and stable  Last Vitals:  Vitals:   09/01/16 1023 09/01/16 1527  BP: 139/76   Pulse: 74   Resp: 18   Temp: 36.6 C 36.4 C    Last Pain:  Vitals:   09/01/16 1023  TempSrc: Oral  PainSc:       Patients Stated Pain Goal: 2 (09/01/16 1022)  Complications: No apparent anesthesia complications

## 2016-09-01 NOTE — Op Note (Signed)
Total Knee Arthroplasty Procedure Note Kathleen AgeLouise H Frese [id number] 09/01/2016   Preoperative diagnosis: Left knee osteoarthritis  Postoperative diagnosis:same  Operative procedure: Left total knee arthroplasty. CPT [phone]  Surgeon: N. Glee ArvinMichael Xu, MD  Assistants: Patrick Jupiterarla Bethune, RNFA  Anesthesia: Spinal, regional  Tourniquet time: less than 90 minutes  Implants used: Smith and Nephew  Femur: Legion PS 5 Tibia: Genesis II 5 Patella: 29 mm, 7.5 Polyethylene: 11 mm  Indication: Kathleen Rodriguez is a 50 y.o. year old female with a history of knee pain. Having failed conservative management, the patient elected to proceed with a total knee arthroplasty.  We have reviewed the risk and benefits of the surgery and they elected to proceed after voicing understanding.  Procedure:  After informed consent was obtained and understanding of the risk were voiced including but not limited to bleeding, infection, damage to surrounding structures including nerves and vessels, blood clots, leg length inequality and the failure to achieve desired results, the operative extremity was marked with verbal confirmation of the patient in the holding area.   The patient was then brought to the operating room and transported to the operating room table in the supine position.  A tourniquet was applied to the operative extremity around the upper thigh. The operative limb was then prepped and draped in the usual sterile fashion and preoperative antibiotics were administered.  A time out was performed prior to the start of surgery confirming the correct extremity, preoperative antibiotic administration, as well as team members, implants and instruments available for the case. Correct surgical site was also confirmed with preoperative radiographs. The limb was then elevated for exsanguination and the tourniquet was inflated. A midline incision was made and a standard medial parapatellar approach was performed.  The  patella was prepared and sized to a 29 mm.  A cover was placed on the patella for protection from retractors.  We then turned our attention to the femur. Posterior cruciate ligament was sacrificed. Start site was drilled in the femur and the intramedullary distal femoral cutting guide was placed, set at 5 degrees valgus, taking 9 mm of distal resection. The distal cut was made. Osteophytes were then removed. Next, the proximal tibial cutting guide was placed with appropriate slope, varus/valgus alignment and depth of resection. The proximal tibial cut was made. Gap blocks were then used to assess the extension gap and alignment, and appropriate soft tissue releases were performed. Attention was turned back to the femur, which was sized using the sizing guide to a size 5. Appropriate rotation of the femoral component was determined using epicondylar axis, Whiteside's line, and assessing the flexion gap under ligament tension. The appropriate size 4-in-1 cutting block was placed and cuts were made. Posterior femoral osteophytes and uncapped bone were then removed with the curved osteotome. The tibia was sized for a size 5 component. The femoral box-cutting guide was placed and prepared for a PS femoral component. Trial components were placed, and stability was checked in full extension, mid-flexion, and deep flexion. Proper tibial rotation was determined and marked.  The patella tracked well without a lateral release. Trial components were then removed and tibial preparation performed. A posterior capsular injection comprising of 20 cc of 1.3% exparel and 40 cc of normal saline was performed for postoperative pain control. The bony surfaces were irrigated with a pulse lavage and then dried. Bone cement was vacuum mixed on the back table, and the final components sized above were cemented into place. After cement had finished  curing, excess cement was removed. The stability of the construct was re-evaluated  throughout a range of motion and found to be acceptable. The trial liner was removed, the knee was copiously irrigated, and the knee was re-evaluated for any excess bone debris. The real polyethylene liner, 11 mm thick, was inserted and checked to ensure the locking mechanism had engaged appropriately. The tourniquet was deflated and hemostasis was achieved. The wound was irrigated with dilute betadine in normal saline, and then again with normal saline. A drain was placed. Capsular closure was performed with a #1 vicryl, subcutaneous fat closed with a 0 vicryl suture, then subcutaneous tissue closed with interrupted 2.0 vicryl suture. The skin was then closed with a 3.0 monocryl. A sterile dressing was applied.   The patient was awakened in the operating room and taken to recovery in stable condition. All sponge, needle, and instrument counts were correct at the end of the case.  Position: supine  Complications: none.  Time Out: performed   Drains/Packing: none  Estimated blood loss: minimal  Returned to Recovery Room: in good condition.   Antibiotics: yes   Mechanical VTE (DVT) Prophylaxis: sequential compression devices, TED thigh-high  Chemical VTE (DVT) Prophylaxis: aspirin  Fluid Replacement  Crystalloid: see anesthesia record Blood: none  FFP: none   Specimens Removed: 1 to pathology   Sponge and Instrument Count Correct? yes   PACU: portable radiograph - knee AP and Lateral   Admission: inpatient status, start PT & OT POD#1  Plan/RTC: Return in 2 weeks for wound check.   Weight Bearing/Load Lower Extremity: full   N. Glee ArvinMichael Xu, MD [facility] [phone] 2:43 PM

## 2016-09-01 NOTE — Anesthesia Preprocedure Evaluation (Addendum)
Anesthesia Evaluation  Patient identified by MRN, date of birth, ID band Patient awake    Reviewed: Allergy & Precautions, NPO status , Patient's Chart, lab work & pertinent test results  Airway Mallampati: I  TM Distance: >3 FB Neck ROM: Full    Dental  (+) Teeth Intact, Dental Advisory Given   Pulmonary asthma , sleep apnea , COPD,  COPD inhaler, former smoker,    breath sounds clear to auscultation       Cardiovascular hypertension, +CHF   Rhythm:Regular Rate:Normal     Neuro/Psych  Neuromuscular disease    GI/Hepatic Neg liver ROS, GERD  ,  Endo/Other  negative endocrine ROS  Renal/GU negative Renal ROS  negative genitourinary   Musculoskeletal  (+) Arthritis , Osteoarthritis,    Abdominal   Peds negative pediatric ROS (+)  Hematology negative hematology ROS (+)   Anesthesia Other Findings   Reproductive/Obstetrics negative OB ROS                           Lab Results  Component Value Date   WBC 6.3 08/25/2016   HGB 13.2 08/25/2016   HCT 40.3 08/25/2016   MCV 86.1 08/25/2016   PLT 286 08/25/2016   Lab Results  Component Value Date   CREATININE 0.77 08/25/2016   BUN 11 08/25/2016   NA 139 08/25/2016   K 4.0 08/25/2016   CL 108 08/25/2016   CO2 25 08/25/2016   Lab Results  Component Value Date   INR 1.02 08/25/2016   INR 1.11 11/21/2015   INR 1.14 02/09/2013   06/2016 EKG: normal sinus rhythm.  Anesthesia Physical Anesthesia Plan  ASA: III  Anesthesia Plan: Spinal   Post-op Pain Management:  Regional for Post-op pain   Induction: Intravenous  Airway Management Planned: Natural Airway  Additional Equipment:   Intra-op Plan:   Post-operative Plan:   Informed Consent: I have reviewed the patients History and Physical, chart, labs and discussed the procedure including the risks, benefits and alternatives for the proposed anesthesia with the patient or  authorized representative who has indicated his/her understanding and acceptance.     Plan Discussed with: CRNA  Anesthesia Plan Comments:        Anesthesia Quick Evaluation

## 2016-09-02 ENCOUNTER — Encounter (HOSPITAL_COMMUNITY): Payer: Self-pay | Admitting: Orthopaedic Surgery

## 2016-09-02 LAB — CBC
HCT: 37.2 % (ref 36.0–46.0)
HEMOGLOBIN: 11.9 g/dL — AB (ref 12.0–15.0)
MCH: 27.5 pg (ref 26.0–34.0)
MCHC: 32 g/dL (ref 30.0–36.0)
MCV: 85.9 fL (ref 78.0–100.0)
PLATELETS: 276 10*3/uL (ref 150–400)
RBC: 4.33 MIL/uL (ref 3.87–5.11)
RDW: 15.6 % — ABNORMAL HIGH (ref 11.5–15.5)
WBC: 10.1 10*3/uL (ref 4.0–10.5)

## 2016-09-02 LAB — BASIC METABOLIC PANEL
ANION GAP: 7 (ref 5–15)
BUN: 11 mg/dL (ref 6–20)
CHLORIDE: 105 mmol/L (ref 101–111)
CO2: 25 mmol/L (ref 22–32)
Calcium: 8.5 mg/dL — ABNORMAL LOW (ref 8.9–10.3)
Creatinine, Ser: 0.82 mg/dL (ref 0.44–1.00)
GFR calc Af Amer: >60 mL/min (ref >60)
Glucose, Bld: 109 mg/dL — ABNORMAL HIGH (ref 65–99)
POTASSIUM: 4.7 mmol/L (ref 3.5–5.1)
SODIUM: 137 mmol/L (ref 135–145)

## 2016-09-02 NOTE — Evaluation (Signed)
Occupational Therapy Evaluation Patient Details Name: Kathleen Rodriguez MRN: [id number] DOB: Jan 07, 1966 Today's Date: 09/02/2016    History of Present Illness Pt is a 50 yo female admitted on 09/01/16 for left TKA. PMH significant for CHF, OA, HTN, and Cocaine abuse.    Clinical Impression   PTA, pt was independent with ADL and IADL. Pt currently requires min assist for LB ADL and functional mobility. Pt lives with husband and two sons who will all be gone during the day from approximately 7-3:30 but will be able to provide assist outside of these times. Educated pt on safe completion of ADL post-operatively, safe use of DME, use of ice for pain management, use of AE to improve independence with ADL. Pt would benefit from continued OT services to improve independence with ADL and functional mobility in order to ensure safe D/C home with intermitted assistance from family. OT will continue to follow acutely with focus on AE education and tub transfers.     Follow Up Recommendations  No OT follow up;Supervision - Intermittent    Equipment Recommendations  3 in 1 bedside comode       Precautions / Restrictions Precautions Precautions: Knee Precaution Booklet Issued: No Precaution Comments: Reviewed knee precautions during ADL Restrictions Weight Bearing Restrictions: Yes LLE Weight Bearing: Weight bearing as tolerated      Mobility Bed Mobility Overal bed mobility: Needs Assistance Bed Mobility: Supine to Sit     Supine to sit: Min guard     General bed mobility comments: Able to move LLE to EOB with assist from RLE and HOB elevated with use of rails.  Transfers Overall transfer level: Needs assistance Equipment used: Rolling walker (2 wheeled) Transfers: Sit to/from Stand Sit to Stand: Min assist         General transfer comment: Min A for safety from EOB to RW    Balance Overall balance assessment: Needs assistance Sitting-balance support: Feet supported;No upper  extremity supported Sitting balance-Leahy Scale: Fair Sitting balance - Comments: No back support   Standing balance support: During functional activity;Single extremity supported Standing balance-Leahy Scale: Poor Standing balance comment: Able to remove single UE from RW for support during toileting hygeine                            ADL Overall ADL's : Needs assistance/impaired Eating/Feeding: Set up;Sitting   Grooming: Set up;Sitting   Upper Body Bathing: Set up;Sitting   Lower Body Bathing: Minimal assistance;Sit to/from stand   Upper Body Dressing : Set up;Sitting   Lower Body Dressing: Minimal assistance;Sit to/from stand;Min guard Lower Body Dressing Details (indicate cue type and reason): Required min assist with initial ambulation but progressed to min guard Toilet Transfer: Minimal assistance;RW;BSC   Toileting- Clothing Manipulation and Hygiene: Min guard;Sit to/from stand       Functional mobility during ADLs: Min guard;Minimal assistance;Rolling walker (Initially required min assist but progressed to min guard) General ADL Comments: Educated pt on ADL post-operatively, use of AE for LB ADL, dressing techniuqes, use of ice for pain management.     Vision Vision Assessment?: No apparent visual deficits          Pertinent Vitals/Pain Pain Assessment: 0-10 Pain Score: 7  Pain Location: L knee Pain Descriptors / Indicators: Aching;Operative site guarding;Grimacing;Sore Pain Intervention(s): Monitored during session;Repositioned;Ice applied     Hand Dominance Right   Extremity/Trunk Assessment Upper Extremity Assessment Upper Extremity Assessment: Overall WFL for tasks assessed  Lower Extremity Assessment Lower Extremity Assessment: LLE deficits/detail LLE Deficits / Details: Decreased strength and ROM as expected post-operatively.       Communication Communication Communication: No difficulties   Cognition Arousal/Alertness:  Awake/alert Behavior During Therapy: WFL for tasks assessed/performed Overall Cognitive Status: Within Functional Limits for tasks assessed                        Exercises Exercises: Total Joint          Home Living Family/patient expects to be discharged to:: Private residence Living Arrangements: Spouse/significant other;Children Available Help at Discharge: Family;Home health Type of Home: House Home Access: Stairs to enter Secretary/administratorntrance Stairs-Number of Steps: 2 Entrance Stairs-Rails: Right Home Layout: One level     Bathroom Shower/Tub: Tub/shower unit Shower/tub characteristics: Engineer, building servicesCurtain Bathroom Toilet: Standard     Home Equipment: Environmental consultantWalker - standard;Cane - single point          Prior Functioning/Environment Level of Independence: Independent        Comments: completely independent before surgery        OT Problem List: Decreased strength;Decreased range of motion;Decreased activity tolerance;Impaired balance (sitting and/or standing);Decreased knowledge of use of DME or AE;Decreased knowledge of precautions;Pain   OT Treatment/Interventions: Self-care/ADL training;Therapeutic exercise;Therapeutic activities;Patient/family education;Balance training    OT Goals(Current goals can be found in the care plan section) Acute Rehab OT Goals Patient Stated Goal: to get home and have less pain OT Goal Formulation: With patient Time For Goal Achievement: 09/16/16 Potential to Achieve Goals: Good ADL Goals Pt Will Perform Lower Body Bathing: with modified independence;with adaptive equipment;sit to/from stand Pt Will Perform Lower Body Dressing: with modified independence;with adaptive equipment;sit to/from stand Pt Will Transfer to Toilet: with modified independence;bedside commode;ambulating Pt Will Perform Toileting - Clothing Manipulation and hygiene: with modified independence;sit to/from stand Pt Will Perform Tub/Shower Transfer: with  supervision;ambulating;shower seat;3 in 1;rolling walker  OT Frequency: Min 2X/week    End of Session Equipment Utilized During Treatment: Gait belt;Rolling walker  Activity Tolerance: Patient tolerated treatment well Patient left: in chair;with call bell/phone within reach   Time: 1217-1245 OT Time Calculation (min): 28 min Charges:  OT General Charges $OT Visit: 1 Procedure OT Evaluation $OT Eval Moderate Complexity: 1 Procedure OT Treatments $Self Care/Home Management : 8-22 mins  Doristine SectionCharity A Bailei Rodriguez, OTR/L [phone] 09/02/2016, 12:57 PM

## 2016-09-02 NOTE — Progress Notes (Signed)
Physical Therapy Treatment Patient Details Name: Kathleen Rodriguez AgeLouise H Bunkley MRN: [id number] DOB: [birth year] Today's Date: 09/02/2016    History of Present Illness Pt is a 50 yo female admitted on 09/01/16 for left TKA. PMH significant for CHF, OA, HTN, and Cocaine abuse.     PT Comments    Pt presents with decreased c/o pain this session. Performed gait x 100' with multiple standing rest breaks required due to dyspnea from asthma. Pt performed supine LE exercises and reviewed HEP verbally and gave hand out for pt to continue. Pt will benefit from stair negotiation training as she has steps to enter her home prior to dc.    Follow Up Recommendations  Home health PT     Equipment Recommendations  Rolling walker with 5" wheels;3in1 (PT)    Recommendations for Other Services       Precautions / Restrictions Precautions Precautions: Knee Precaution Booklet Issued: No Precaution Comments: Reviewed no pillow under knee Restrictions Weight Bearing Restrictions: Yes LLE Weight Bearing: Weight bearing as tolerated    Mobility  Bed Mobility Overal bed mobility: Needs Assistance Bed Mobility: Supine to Sit     Supine to sit: Min guard     General bed mobility comments: Min guard for safety, able to move LLE into bed and use railings to resposition  Transfers Overall transfer level: Needs assistance Equipment used: Rolling walker (2 wheeled) Transfers: Sit to/from Stand Sit to Stand: Min guard         General transfer comment: min guard for safety   Ambulation/Gait Ambulation/Gait assistance: Min guard Ambulation Distance (Feet): 100 Feet Assistive device: Rolling walker (2 wheeled) Gait Pattern/deviations: Step-through pattern;Decreased step length - right;Decreased stance time - left;Antalgic Gait velocity: decreased Gait velocity interpretation: Below normal speed for age/gender General Gait Details: min guard for safety, moderate antalgia noted and pt had to rest multiple  times due to SOB related to asthma   Stairs            Wheelchair Mobility    Modified Rankin (Stroke Patients Only)       Balance Overall balance assessment: Needs assistance Sitting-balance support: Feet supported;No upper extremity supported Sitting balance-Leahy Scale: Good Sitting balance - Comments: No back support   Standing balance support: Bilateral upper extremity supported Standing balance-Leahy Scale: Poor Standing balance comment: relies on RW for stability                    Cognition Arousal/Alertness: Awake/alert Behavior During Therapy: WFL for tasks assessed/performed Overall Cognitive Status: Within Functional Limits for tasks assessed                      Exercises Total Joint Exercises Ankle Circles/Pumps: AROM;Both;20 reps;Supine Quad Sets: AROM;Left;Supine;10 reps Short Arc Quad: AROM;10 reps;Left;Supine Heel Slides: AROM;Left;10 reps;Supine Hip ABduction/ADduction: AROM;Left;10 reps;Supine Goniometric ROM: 2-50    General Comments        Pertinent Vitals/Pain Pain Assessment: 0-10 Pain Score: 3  Pain Location: left knee Pain Descriptors / Indicators: Aching;Sore;Throbbing Pain Intervention(s): Monitored during session;Premedicated before session;Repositioned    Home Living Family/patient expects to be discharged to:: Private residence Living Arrangements: Spouse/significant other;Children Available Help at Discharge: Family;Home health Type of Home: House Home Access: Stairs to enter Entrance Stairs-Rails: Right Home Layout: One level Home Equipment: Walker - standard;Cane - single point      Prior Function Level of Independence: Independent      Comments: completely independent before surgery   PT Goals (current goals  can now be found in the care plan section) Acute Rehab PT Goals Patient Stated Goal: to get home and have less pain PT Goal Formulation: With patient Time For Goal Achievement:  09/09/16 Potential to Achieve Goals: Good Progress towards PT goals: Progressing toward goals    Frequency    7X/week      PT Plan Current plan remains appropriate    Co-evaluation             End of Session Equipment Utilized During Treatment: Gait belt Activity Tolerance: Patient tolerated treatment well;Patient limited by fatigue Patient left: in chair;with call bell/phone within reach     Time: 1325-1354 PT Time Calculation (min) (ACUTE ONLY): 29 min  Charges:  $Gait Training: 8-22 mins $Therapeutic Exercise: 8-22 mins                    G Codes:      Colin BroachSabra M. Lylian Sanagustin PT, DPT  [phone]  09/02/2016, 2:00 PM

## 2016-09-02 NOTE — Evaluation (Signed)
Physical Therapy Evaluation Patient Details Name: Kathleen Rodriguez MRN: [id number] DOB: 1966-09-22 Today's Date: 09/02/2016   History of Present Illness  Pt is a 50 yo female admitted on 09/01/16 for left TKA. PMH significant for CHF, OA, HTN, and Cocaine abuse.   Clinical Impression  Pt is POD 1 and moving well with therapy. Pt is limited by pain in LLE with gait activities. Initiated ankle pumps and quad sets this session and will progress HEP next session. Performed gait x 50' with WBAT and improved sequencing noted with min cues from therapy. Pt was independent prior to admission and would benefit from continued acute therapy services to address the below deficits in order to assist with a smooth transition home.    Follow Up Recommendations Home health PT    Equipment Recommendations  Rolling walker with 5" wheels;3in1 (PT)    Recommendations for Other Services       Precautions / Restrictions Precautions Precautions: Knee Precaution Comments: no pilllow behind knee Restrictions Weight Bearing Restrictions: Yes LLE Weight Bearing: Weight bearing as tolerated      Mobility  Bed Mobility Overal bed mobility: Needs Assistance Bed Mobility: Supine to Sit     Supine to sit: Min assist     General bed mobility comments: Min a for moving LLE to EOB with HOB elevated and use of railing  Transfers Overall transfer level: Needs assistance Equipment used: Rolling walker (2 wheeled) Transfers: Sit to/from Stand Sit to Stand: Min assist         General transfer comment: Min A for safety from EOB to RW  Ambulation/Gait Ambulation/Gait assistance: Min assist Ambulation Distance (Feet): 50 Feet Assistive device: Rolling walker (2 wheeled) Gait Pattern/deviations: Step-to pattern;Decreased step length - right;Decreased stance time - left;Antalgic Gait velocity: decreased Gait velocity interpretation: Below normal speed for age/gender General Gait Details: Min A for safety  and to maneuver IV pole, moderate antalgia noted with cues for proper heel to toe sequencing  Stairs            Wheelchair Mobility    Modified Rankin (Stroke Patients Only)       Balance Overall balance assessment: Needs assistance Sitting-balance support: Feet supported Sitting balance-Leahy Scale: Fair Sitting balance - Comments: No back support   Standing balance support: Bilateral upper extremity supported Standing balance-Leahy Scale: Poor Standing balance comment: Uses RW for stability                             Pertinent Vitals/Pain Pain Assessment: 0-10 Pain Score: 7  Pain Location: left knee Pain Descriptors / Indicators: Burning;Throbbing Pain Intervention(s): Monitored during session;Repositioned    Home Living Family/patient expects to be discharged to:: Private residence Living Arrangements: Spouse/significant other;Children Available Help at Discharge: Family;Home health Type of Home: House Home Access: Stairs to enter Entrance Stairs-Rails: Right Entrance Stairs-Number of Steps: 2 Home Layout: One level Home Equipment: Walker - standard;Cane - single point      Prior Function Level of Independence: Independent         Comments: completely independent before surgery     Hand Dominance   Dominant Hand: Right    Extremity/Trunk Assessment   Upper Extremity Assessment: Defer to OT evaluation           Lower Extremity Assessment: LLE deficits/detail   LLE Deficits / Details: pt with normal post op pain and weakness. At least 3/5 ankle, 2/5 knee and hip per gross functional  assessment     Communication   Communication: No difficulties  Cognition Arousal/Alertness: Awake/alert Behavior During Therapy: WFL for tasks assessed/performed Overall Cognitive Status: Within Functional Limits for tasks assessed                      General Comments      Exercises Total Joint Exercises Ankle Circles/Pumps:  AROM;Both;20 reps;Supine Quad Sets: AROM;Left;5 reps;Supine   Assessment/Plan    PT Assessment Patient needs continued PT services  PT Problem List Decreased strength;Decreased range of motion;Decreased activity tolerance;Decreased balance;Decreased mobility;Decreased knowledge of use of DME;Decreased knowledge of precautions;Pain;Obesity          PT Treatment Interventions DME instruction;Gait training;Functional mobility training;Stair training;Therapeutic activities;Therapeutic exercise;Balance training;Patient/family education    PT Goals (Current goals can be found in the Care Plan section)  Acute Rehab PT Goals Patient Stated Goal: to get home and have less pain PT Goal Formulation: With patient Time For Goal Achievement: 09/09/16 Potential to Achieve Goals: Good    Frequency 7X/week   Barriers to discharge        Co-evaluation               End of Session Equipment Utilized During Treatment: Gait belt Activity Tolerance: Patient tolerated treatment well;Patient limited by fatigue Patient left: in chair;with call bell/phone within reach Nurse Communication: Mobility status         Time: [id number] PT Time Calculation (min) (ACUTE ONLY): 26 min   Charges:   PT Evaluation $PT Eval Low Complexity: 1 Procedure PT Treatments $Gait Training: 8-22 mins   PT G Codes:        Colin BroachSabra M. Juli Odom PT, DPT  [phone]  09/02/2016, 10:33 AM

## 2016-09-02 NOTE — Care Management Note (Signed)
Case Management Note  Patient Details  Name: Kathleen Rodriguez MRN: [id number] Date of Birth: 07/22/1966  Subjective/Objective:   50 yr old female s/p left total knee arthroplasty.                 Action/Plan: Case manager spoke with patient concerning discharge plan. Patient was preoperatively setup with [facility], no changes. Patient states family will assist at discharge. CM has ordered rolling walker and 3in1.    Expected Discharge Date:    09/02/16              Expected Discharge Plan:  Home w Home Health Services  In-House Referral:     Discharge planning Services  CM Consult  Post Acute Care Choice:  Durable Medical Equipment, Home Health Choice offered to:  Patient  DME Arranged:  3-N-1, Walker rolling DME Agency:  [facility]  HH Arranged:  [facility]  Status of Service:  Completed, signed off  If discussed at [employer] Length of Stay Meetings, dates discussed:    Additional Comments:  Durenda GuthrieBrady, Maurina Fawaz Naomi, RN 09/02/2016, 10:09 AM

## 2016-09-02 NOTE — Progress Notes (Signed)
   Subjective:  Patient reports pain as moderate.  Severe o/n.  Objective:   VITALS:   Vitals:   09/01/16 1715 09/01/16 2132 09/02/16 0211 09/02/16 0448  BP: 117/70 133/85 116/78 131/73  Pulse: 78 (!) 102 (!) 101 (!) 112  Resp: 17 16 16 18   Temp: 98.7 F (37.1 C) 98.4 F (36.9 C) 98.2 F (36.8 C) 98 F (36.7 C)  TempSrc: Oral Oral Oral Oral  SpO2: 98% 99% 97% 95%  Weight:      Height:        Neurologically intact Neurovascular intact Sensation intact distally Intact pulses distally Dorsiflexion/Plantar flexion intact Incision: dressing C/D/I and no drainage No cellulitis present Compartment soft   Lab Results  Component Value Date   WBC 10.1 09/02/2016   HGB 11.9 (L) 09/02/2016   HCT 37.2 09/02/2016   MCV 85.9 09/02/2016   PLT 276 09/02/2016     Assessment/Plan:  1 Day Post-Op   - Expected postop acute blood loss anemia - will monitor for symptoms - Up with PT/OT - DVT ppx - SCDs, ambulation, aspirin - WBAT operative extremity - Pain control - HVAC removed - Discharge planning - likely home sat  Glee ArvinMichael Xu 09/02/2016, 7:05 AM [phone]

## 2016-09-03 LAB — CBC
HCT: 34.5 % — ABNORMAL LOW (ref 36.0–46.0)
Hemoglobin: 11.2 g/dL — ABNORMAL LOW (ref 12.0–15.0)
MCH: 27.4 pg (ref 26.0–34.0)
MCHC: 32.5 g/dL (ref 30.0–36.0)
MCV: 84.4 fL (ref 78.0–100.0)
Platelets: 237 10*3/uL (ref 150–400)
RBC: 4.09 MIL/uL (ref 3.87–5.11)
RDW: 15.6 % — AB (ref 11.5–15.5)
WBC: 11 10*3/uL — AB (ref 4.0–10.5)

## 2016-09-03 MED ORDER — ASPIRIN 325 MG PO TBEC: 325.0000 mg | DELAYED_RELEASE_TABLET | Freq: Two times a day (BID) | ORAL | 0 refills | Status: DC

## 2016-09-03 MED ORDER — ONDANSETRON HCL 4 MG PO TABS: 4.0000 mg | ORAL_TABLET | Freq: Four times a day (QID) | ORAL | 0 refills | Status: DC | PRN

## 2016-09-03 NOTE — Progress Notes (Signed)
Met with pt at bedside to discuss new prescriptions. She stated that she goes to the [facility] and she has an orange card for prescriptions. Discussed with pt filling the prescriptions at [facility] and provided pt with coupons for medications.

## 2016-09-03 NOTE — Progress Notes (Addendum)
Physical Therapy Treatment Patient Details Name: Kathleen Rodriguez MRN: [id number] DOB: 1966-06-24 Today's Date: 09/03/2016    History of Present Illness Pt is a 50 yo female admitted on 09/01/16 for left TKA. PMH significant for CHF, OA, HTN, and Cocaine abuse.     PT Comments    Pt presented sitting OOB in recliner when PT entered room. Pt was awake and willing to participate in gait and stair training this session. Pt had difficulty with stair management, requiring mod A at pelvis and max VC'ing for safety secondary to pt's heightened anxiety. Pt stated multiple times "I'll just get my husband to carry me up the stairs at home". However, pt's husband was not present during session to observe stair training. PT and pt's nurse expressed concern for pt's safety and ability to still d/c home at this time as she is only able to ambulate short distances and was very unsteady during stair training. Pt adamant about returning home today.   L knee flexion = 60 degrees; L knee extension = lacking 15 degrees to neutral; measured in sitting  Pt would continue to benefit from skilled physical therapy services at this time while admitted and after d/c to address her limitations in order to improve her overall safety and independence with functional mobility.   Follow Up Recommendations  Home health PT;Supervision for mobility/OOB     Equipment Recommendations  Rolling walker with 5" wheels;3in1 (PT)    Recommendations for Other Services       Precautions / Restrictions Precautions Precautions: Knee Precaution Comments: reviewed positioning and ADLs Restrictions Weight Bearing Restrictions: Yes LLE Weight Bearing: Weight bearing as tolerated    Mobility  Bed Mobility Overal bed mobility: Needs Assistance Bed Mobility: Supine to Sit     Supine to sit: Min assist     General bed mobility comments: pt required min A to return L LE into bed  Transfers Overall transfer level: Needs  assistance Equipment used: Rolling walker (2 wheeled) Transfers: Sit to/from Stand Sit to Stand: Min guard         General transfer comment: min guard for safety   Ambulation/Gait Ambulation/Gait assistance: Min guard Ambulation Distance (Feet): 20 Feet (20' x3 with standing rest breaks in between) Assistive device: Rolling walker (2 wheeled) Gait Pattern/deviations: Step-to pattern;Decreased step length - right;Decreased stance time - left;Decreased weight shift to left;Antalgic Gait velocity: decreased Gait velocity interpretation: Below normal speed for age/gender General Gait Details: min guard for safety; pt required frequent standing rest breaks secondary to pain and increased work of breathing   Stairs Stairs: Yes Stairs assistance: Mod assist Stair Management: One rail Right;Step to pattern;Forwards Number of Stairs: 1 General stair comments: pt was very anxious during stair training and became teary-eyed. she was able to ascend one step with bilateral UEs on R handrail and mod A at pelvis. Pt had difficulty lifting her L LE up and clearing her toes to get her L foot onto the step. When descending, pt lead with L LE and PT blocked L knee. Pt very unstable and anxious during stair training.  Wheelchair Mobility    Modified Rankin (Stroke Patients Only)       Balance Overall balance assessment: Needs assistance Sitting-balance support: Feet supported;No upper extremity supported Sitting balance-Leahy Scale: Good     Standing balance support: During functional activity;Bilateral upper extremity supported Standing balance-Leahy Scale: Poor Standing balance comment: pt reliant on bilateral UEs on RW  Cognition Arousal/Alertness: Awake/alert Behavior During Therapy: WFL for tasks assessed/performed;Anxious Overall Cognitive Status: Within Functional Limits for tasks assessed                      Exercises      General Comments         Pertinent Vitals/Pain Pain Assessment: Faces Pain Score: 8  Faces Pain Scale: Hurts a little bit Pain Location: L knee Pain Descriptors / Indicators: Sore Pain Intervention(s): Monitored during session;Repositioned    Home Living                      Prior Function            PT Goals (current goals can now be found in the care plan section) Acute Rehab PT Goals Patient Stated Goal: to return home today PT Goal Formulation: With patient Time For Goal Achievement: 09/09/16 Potential to Achieve Goals: Good Progress towards PT goals: Progressing toward goals    Frequency    7X/week      PT Plan Current plan remains appropriate    Co-evaluation             End of Session Equipment Utilized During Treatment: Gait belt Activity Tolerance: Patient limited by pain Patient left: in bed;with call bell/phone within reach     Time: 1229-1254 PT Time Calculation (min) (ACUTE ONLY): 25 min  Charges:  $Gait Training: 23-37 mins                    G CodesAlessandra Rodriguez:      Kathleen Rodriguez M Kathleen Rodriguez 09/03/2016, 1:40 PM Kathleen ChalkJennifer Isaiahs Rodriguez, PT, DPT [phone]

## 2016-09-03 NOTE — Progress Notes (Signed)
   Subjective: 2 Days Post-Op Procedure(s) (LRB): LEFT TOTAL KNEE ARTHROPLASTY (Left) Patient reports pain as mild and moderate.    Objective: Vital signs in last 24 hours: Temp:  [98.1 F (36.7 C)-98.5 F (36.9 C)] 98.5 F (36.9 C) (11/11 0440) Pulse Rate:  [104-112] 108 (11/11 0440) Resp:  [16] 16 (11/10 1500) BP: (117-147)/(69-82) 117/82 (11/11 0440) SpO2:  [94 %-98 %] 94 % (11/11 0440)  Intake/Output from previous day: No intake/output data recorded. Intake/Output this shift: Total I/O In: 240 [P.O.:240] Out: -    Recent Labs  09/02/16 0226 09/03/16 0554  HGB 11.9* 11.2*    Recent Labs  09/02/16 0226 09/03/16 0554  WBC 10.1 11.0*  RBC 4.33 4.09  HCT 37.2 34.5*  PLT 276 237    Recent Labs  09/02/16 0226  NA 137  K 4.7  CL 105  CO2 25  BUN 11  CREATININE 0.82  GLUCOSE 109*  CALCIUM 8.5*   No results for input(s): LABPT, INR in the last 72 hours.  Neurologically intact  Assessment/Plan: 2 Days Post-Op Procedure(s) (LRB): LEFT TOTAL KNEE ARTHROPLASTY (Left) Discharge home with home health  Eldred MangesMark C Stephonie Wilcoxen 09/03/2016, 9:24 AM

## 2016-09-03 NOTE — Progress Notes (Signed)
Physical Therapy Treatment Patient Details Name: Kathleen Rodriguez MRN: [id number] DOB: 14-Dec-1965 Today's Date: 09/03/2016    History of Present Illness Pt is a 50 yo female admitted on 09/01/16 for left TKA. PMH significant for CHF, OA, HTN, and Cocaine abuse.     PT Comments    Pt presented supine in bed with HOB elevated, awake and initially refusing to participate in therapy secondary to reports of 10/10 pain in L knee. With encouragement, pt willing to participate in therapeutic exercises. Pt would continue to benefit from skilled physical therapy services at this time while admitted and after d/c to address her limitations in order to improve her overall safety and independence with functional mobility. PT planning to stair train at next session.   Follow Up Recommendations  Home health PT     Equipment Recommendations  Rolling walker with 5" wheels;3in1 (PT)    Recommendations for Other Services       Precautions / Restrictions Precautions Precautions: Knee Restrictions Weight Bearing Restrictions: Yes LLE Weight Bearing: Weight bearing as tolerated    Mobility  Bed Mobility               General bed mobility comments: pt only willing to participate in bed level therapeutic exercises secondary to reports of 10/10 pain  Transfers                    Ambulation/Gait                 Stairs            Wheelchair Mobility    Modified Rankin (Stroke Patients Only)       Balance                                    Cognition Arousal/Alertness: Awake/alert Behavior During Therapy: WFL for tasks assessed/performed Overall Cognitive Status: Within Functional Limits for tasks assessed                      Exercises Total Joint Exercises Ankle Circles/Pumps: AROM;Left;10 reps;Supine Quad Sets: AROM;Strengthening;Left;15 reps;Supine Short Arc Quad: AROM;Strengthening;Left;15 reps;Supine Heel Slides:  AROM;AAROM;Left;15 reps;Supine Hip ABduction/ADduction: AROM;AAROM;Left;15 reps;Supine Straight Leg Raises: AAROM;Left;15 reps;Supine    General Comments        Pertinent Vitals/Pain Pain Assessment: 0-10 Pain Score: 10-Worst pain ever Pain Location: L knee Pain Descriptors / Indicators: Grimacing;Guarding;Moaning;Sore Pain Intervention(s): Monitored during session;Repositioned;Ice applied    Home Living                      Prior Function            PT Goals (current goals can now be found in the care plan section) Acute Rehab PT Goals Patient Stated Goal: to return home today PT Goal Formulation: With patient Time For Goal Achievement: 09/09/16 Potential to Achieve Goals: Good Progress towards PT goals: Progressing toward goals    Frequency    7X/week      PT Plan Current plan remains appropriate    Co-evaluation             End of Session   Activity Tolerance: Patient limited by pain Patient left: in bed;with call bell/phone within reach     Time: 0852-0912 PT Time Calculation (min) (ACUTE ONLY): 20 min  Charges:  $Therapeutic Exercise: 8-22 mins  G CodesAlessandra Bevels:      Apoorva Bugay M Bassam Dresch 09/03/2016, 11:06 AM Deborah ChalkJennifer Denim Start, PT, DPT [phone]

## 2016-09-03 NOTE — Progress Notes (Signed)
Occupational Therapy Treatment Patient Details Name: Kathleen Rodriguez MRN: [id number] DOB: 12/13/1965 Today's Date: 09/03/2016    History of present illness Pt is a 50 yo female admitted on 09/01/16 for left TKA. PMH significant for CHF, OA, HTN, and Cocaine abuse.    OT comments  Pt progressing towards acute OT goals. Focus of session was toilet transfer and review of tub transfer and LB AE education. D/c plan remains appropriate.   Follow Up Recommendations  No OT follow up;Supervision - Intermittent    Equipment Recommendations  3 in 1 bedside comode    Recommendations for Other Services      Precautions / Restrictions Precautions Precautions: Knee Precaution Comments: reviewed positioning and ADLs Restrictions Weight Bearing Restrictions: Yes LLE Weight Bearing: Weight bearing as tolerated       Mobility Bed Mobility Overal bed mobility: Needs Assistance Bed Mobility: Supine to Sit     Supine to sit: Min assist;HOB elevated     General bed mobility comments: Discussed body mechanics for family assisting with bed mobility at home.   Transfers Overall transfer level: Needs assistance Equipment used: Rolling walker (2 wheeled) Transfers: Sit to/from Stand Sit to Stand: Min guard         General transfer comment: min guard for safety     Balance Overall balance assessment: Needs assistance         Standing balance support: Bilateral upper extremity supported;During functional activity Standing balance-Leahy Scale: Poor Standing balance comment: relies on RW for stability                   ADL Overall ADL's : Needs assistance/impaired                         Toilet Transfer: Min guard;Ambulation;RW Toilet Transfer Details (indicate cue type and reason): cues for positioning in front of BSC in bathroom and for hand placement, technique with rw Toileting- Clothing Manipulation and Hygiene: Min guard;Sit to/from stand     Tub/Shower  Transfer Details (indicate cue type and reason): provided handout and gave demontration and verbal review of technique. Advised someone be with her during shower transfer Functional mobility during ADLs: Min guard;Rolling walker General ADL Comments: Reviewed ADL education including use of AE. Pt completed in-room functional mobility and toilet transfer.       Vision                     Perception     Praxis      Cognition   Behavior During Therapy: [facility] HospitalWFL for tasks assessed/performed Overall Cognitive Status: Within Functional Limits for tasks assessed                       Extremity/Trunk Assessment               Exercises Total Joint Exercises Ankle Circles/Pumps: AROM;Left;10 reps;Supine Quad Sets: AROM;Strengthening;Left;15 reps;Supine Short Arc Quad: AROM;Strengthening;Left;15 reps;Supine Heel Slides: AROM;AAROM;Left;15 reps;Supine Hip ABduction/ADduction: AROM;AAROM;Left;15 reps;Supine Straight Leg Raises: AAROM;Left;15 reps;Supine   Shoulder Instructions       General Comments      Pertinent Vitals/ Pain       Pain Assessment: 0-10 Pain Score: 8  Pain Location: L knee Pain Descriptors / Indicators: Aching;Sore Pain Intervention(s): Limited activity within patient's tolerance;Monitored during session;Repositioned  Home Living  Prior Functioning/Environment              Frequency  Min 2X/week        Progress Toward Goals  OT Goals(current goals can now be found in the care plan section)  Progress towards OT goals: Progressing toward goals  Acute Rehab OT Goals Patient Stated Goal: to return home today OT Goal Formulation: With patient Time For Goal Achievement: 09/16/16 Potential to Achieve Goals: Good ADL Goals Pt Will Perform Lower Body Bathing: with modified independence;with adaptive equipment;sit to/from stand Pt Will Perform Lower Body Dressing: with modified  independence;with adaptive equipment;sit to/from stand Pt Will Transfer to Toilet: with modified independence;bedside commode;ambulating Pt Will Perform Toileting - Clothing Manipulation and hygiene: with modified independence;sit to/from stand Pt Will Perform Tub/Shower Transfer: with supervision;ambulating;shower seat;3 in 1;rolling walker  Plan Discharge plan remains appropriate    Co-evaluation                 End of Session Equipment Utilized During Treatment: Rolling walker   Activity Tolerance Patient tolerated treatment well   Patient Left in chair;with call bell/phone within reach   Nurse Communication Other (comment) (Pt talked with nurse during session.)        Time: [id number] OT Time Calculation (min): 29 min  Charges: OT General Charges $OT Visit: 1 Procedure OT Treatments $Self Care/Home Management : 23-37 mins  Kathleen Rodriguez, Kathleen Rodriguez 09/03/2016, 11:39 AM

## 2016-09-05 ENCOUNTER — Telehealth (INDEPENDENT_AMBULATORY_CARE_PROVIDER_SITE_OTHER): Payer: Self-pay | Admitting: Orthopaedic Surgery

## 2016-09-05 NOTE — Telephone Encounter (Signed)
See message below °

## 2016-09-05 NOTE — Addendum Note (Signed)
Addendum  created 09/05/16 0715 by Shelton SilvasKevin D Cerenity Goshorn, MD   Anesthesia Attestations filed

## 2016-09-05 NOTE — Telephone Encounter (Signed)
Erin (PT) with [facility] called needing verbal orders for 3 times a wk for 2 wks. She advised patient has medicaid and medicaid may or may not give approval for (PT) Home Health.   Also patient was  prescribed sulsamephoxazol-trinethoprim but did not get it filled because didn't have enough money to get Rx filled. Patient said she is going to get her husband or friend to go to a free clinic to get Rx filled hopefully today.  Patient did not have Aspirin in her home. Patient's husband brought the aspirin to her to take before Erin left.   The number to contact her is [phone]

## 2016-09-05 NOTE — Telephone Encounter (Signed)
Approve.  She needs to take the antibiotic and aspirin asap.

## 2016-09-05 NOTE — Anesthesia Postprocedure Evaluation (Signed)
Anesthesia Post Note  Patient: Kathleen Rodriguez  Procedure(s) Performed: Procedure(s) (LRB): LEFT TOTAL KNEE ARTHROPLASTY (Left)  Patient location during evaluation: PACU Anesthesia Type: Spinal Level of consciousness: oriented and awake and alert Pain management: pain level controlled Vital Signs Assessment: post-procedure vital signs reviewed and stable Respiratory status: spontaneous breathing, respiratory function stable and patient connected to nasal cannula oxygen Cardiovascular status: blood pressure returned to baseline and stable Postop Assessment: no headache and no backache Anesthetic complications: no    Last Vitals:  Vitals:   09/03/16 1319 09/03/16 1411  BP: (!) 117/59 139/63  Pulse: (!) 112 82  Resp:  16  Temp: 36.7 C 36.5 C    Last Pain:  Vitals:   09/03/16 0909  TempSrc:   PainSc: 4                  Shelton SilvasKevin D Arelie Kuzel

## 2016-09-06 MED FILL — SULFAMETHOXAZOLE/TMP DS TAB: 800-160 | 7 days supply | Qty: 14 | Fill #0

## 2016-09-06 NOTE — Discharge Summary (Signed)
Physician Discharge Summary      Patient ID: Kathleen Rodriguez MRN: [id number] DOB/AGE: April 06, 1966 50 y.o.  Admit date: 09/01/2016 Discharge date: 09/06/2016  Admission Diagnoses:  <principal problem not specified>  Discharge Diagnoses:  Active Problems:   Osteoarthritis of left knee   Total knee replacement status   Past Medical History:  Diagnosis Date  . Arthritis    "knees, lower back; legs, ankles" (01/27/2016)  . Asthma    followed by Dr. Craige CottaSood  . CHF (congestive heart failure) (HCC) 2016   "when I went into a coma"  . Cocaine abuse   . 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 Dyspnea   . GERD (gastroesophageal reflux disease)   . Hypertension    "doctor took me off RX in 2016" (01/27/2016)  . Influenza B April 2014   Complicated by multi-organ failure  . OSA on CPAP "since " 03/20/2013  . Pneumonia 2016  . Required emergent intubation    asthma exacerbation in 2016  . Tobacco abuse     Surgeries: Procedure(s): LEFT TOTAL KNEE ARTHROPLASTY on 09/01/2016   Consultants (if any):   Discharged Condition: Improved  Hospital Course: Kathleen AgeLouise H Rodriguez is an 50 y.o. female who was admitted 09/01/2016 with a diagnosis of <principal problem not specified> and went to the operating room on 09/01/2016 and underwent the above named procedures.    She was given perioperative antibiotics:  Anti-infectives    Start     Dose/Rate Route Frequency Ordered Stop   09/01/16 2200  sulfamethoxazole-trimethoprim (BACTRIM DS,SEPTRA DS) 800-160 MG per tablet 1 tablet  Status:  Discontinued     1 tablet Oral Every 12 hours 09/01/16 1709 09/03/16 1801   09/01/16 1800  ceFAZolin (ANCEF) 3 g in dextrose 5 % 50 mL IVPB     3 g 130 mL/hr over 30 Minutes Intravenous Every 6 hours 09/01/16 1709 09/02/16 0131   09/01/16 1200  ceFAZolin (ANCEF) 3 g in dextrose 5 % 50 mL IVPB     3 g 130 mL/hr over 30 Minutes Intravenous To [facility] 08/31/16 0948 09/01/16 1236   09/01/16 0000   sulfamethoxazole-trimethoprim (BACTRIM DS,SEPTRA DS) 800-160 MG tablet     1 tablet Oral 2 times daily 09/01/16 1450      .  She was given sequential compression devices, early ambulation, and aspirin for DVT prophylaxis.  She benefited maximally from the hospital stay and there were no complications.    Recent vital signs:  Vitals:   09/03/16 1319 09/03/16 1411  BP: (!) 117/59 139/63  Pulse: (!) 112 82  Resp:  16  Temp: 98.1 F (36.7 C) 97.7 F (36.5 C)    Recent laboratory studies:  Lab Results  Component Value Date   HGB 11.2 (L) 09/03/2016   HGB 11.9 (L) 09/02/2016   HGB 13.2 08/25/2016   Lab Results  Component Value Date   WBC 11.0 (H) 09/03/2016   PLT 237 09/03/2016   Lab Results  Component Value Date   INR 1.02 08/25/2016   Lab Results  Component Value Date   NA 137 09/02/2016   K 4.7 09/02/2016   CL 105 09/02/2016   CO2 25 09/02/2016   BUN 11 09/02/2016   CREATININE 0.82 09/02/2016   GLUCOSE 109 (H) 09/02/2016    Discharge Medications:     Medication List    STOP taking these medications   acetaminophen 500 MG tablet Commonly known as:  TYLENOL   acetaminophen-codeine 300-30 MG tablet Commonly known as:  TYLENOL #3   HYDROcodone-acetaminophen 5-325 MG tablet Commonly known as:  NORCO     TAKE these medications   albuterol 108 (90 Base) MCG/ACT inhaler Commonly known as:  VENTOLIN HFA Inhale 2 puffs into the lungs every 6 (six) hours as needed for wheezing. What changed:  Another medication with the same name was removed. Continue taking this medication, and follow the directions you see here.   aspirin EC 325 MG tablet Take 1 tablet (325 mg total) by mouth 2 (two) times daily.   budesonide 0.25 MG/2ML nebulizer solution Commonly known as:  PULMICORT Take 2 mLs (0.25 mg total) by nebulization 2 (two) times daily. What changed:  when to take this  reasons to take this   fluticasone 50 MCG/ACT nasal spray Commonly known as:   FLONASE Place 2 sprays into the nose daily. What changed:  when to take this  reasons to take this   methocarbamol 750 MG tablet Commonly known as:  ROBAXIN Take 1 tablet (750 mg total) by mouth 2 (two) times daily as needed for muscle spasms.   ondansetron 4 MG tablet Commonly known as:  ZOFRAN Take 1-2 tablets (4-8 mg total) by mouth every 8 (eight) hours as needed for nausea or vomiting.   ondansetron 4 MG tablet Commonly known as:  ZOFRAN Take 1 tablet (4 mg total) by mouth every 6 (six) hours as needed for nausea.   oxyCODONE 5 MG immediate release tablet Commonly known as:  Oxy IR/ROXICODONE Take 1-3 tablets (5-15 mg total) by mouth every 4 (four) hours as needed.   oxyCODONE 10 mg 12 hr tablet Commonly known as:  OXYCONTIN Take 1 tablet (10 mg total) by mouth every 12 (twelve) hours.   senna-docusate 8.6-50 MG tablet Commonly known as:  SENOKOT S Take 1 tablet by mouth at bedtime as needed.   sulfamethoxazole-trimethoprim 800-160 MG tablet Commonly known as:  BACTRIM DS,SEPTRA DS Take 1 tablet by mouth 2 (two) times daily.       Diagnostic Studies: Dg Knee Left Port  Result Date: 09/01/2016 CLINICAL DATA:  Status post left total knee arthroplasty EXAM: PORTABLE LEFT KNEE - 1-2 VIEW COMPARISON:  07/29/2016 left knee radiographs FINDINGS: Status post left total knee arthroplasty, with well-positioned left distal femoral, left proximal tibial and left posterior patellar prostheses, with no evidence of hardware fracture or loosening. No left knee dislocation. No osseous fracture. No suspicious focal osseous lesion. Surgical drain terminates over the lateral left knee joint. Expected soft tissue gas within and surrounding the left knee joint. IMPRESSION: Satisfactory immediate postoperative appearance status post left total knee arthroplasty. Electronically Signed   By: Delbert PhenixJason A Poff M.D.   On: 09/01/2016 17:21    Disposition: 01-Home or Self Care    Follow-up  Information    Glee ArvinMichael Lukah Goswami, MD Follow up in 2 week(s).   Specialty:  Orthopedic Surgery Why:  For wound re-check, For suture removal Contact information: [address] [place] [address] [phone]        [facility] Follow up.   Specialty:  Home Health Services Why:  Someone from [facility] will contact you to arrange start date and time for therapy. Contact information: [address] [place] [address] [place] [phone]            Signed: Glee ArvinMichael Fawaz Borquez 09/06/2016, 8:50 PM

## 2016-09-06 NOTE — Telephone Encounter (Signed)
Called Erin back to advise on message. She has medicaid and has to get approval from them first per [place]rin.

## 2016-09-08 ENCOUNTER — Telehealth: Payer: Self-pay | Admitting: Family Medicine

## 2016-09-08 NOTE — Telephone Encounter (Signed)
Patient called and lvm on my machine stating that she was already approved for the OC and is wanting to inquire to see if she was approved for Home Care Therapy. Please follow up with pt. Thank you.

## 2016-09-13 ENCOUNTER — Ambulatory Visit (INDEPENDENT_AMBULATORY_CARE_PROVIDER_SITE_OTHER): Payer: Self-pay | Admitting: Orthopaedic Surgery

## 2016-09-13 ENCOUNTER — Encounter (INDEPENDENT_AMBULATORY_CARE_PROVIDER_SITE_OTHER): Payer: Self-pay | Admitting: Orthopaedic Surgery

## 2016-09-13 ENCOUNTER — Telehealth (INDEPENDENT_AMBULATORY_CARE_PROVIDER_SITE_OTHER): Payer: Self-pay | Admitting: Orthopaedic Surgery

## 2016-09-13 ENCOUNTER — Ambulatory Visit (INDEPENDENT_AMBULATORY_CARE_PROVIDER_SITE_OTHER): Payer: Self-pay

## 2016-09-13 DIAGNOSIS — Z96652 Presence of left artificial knee joint: Secondary | ICD-10-CM

## 2016-09-13 DIAGNOSIS — M1712 Unilateral primary osteoarthritis, left knee: Secondary | ICD-10-CM

## 2016-09-13 MED ORDER — METHOCARBAMOL 500 MG PO TABS: 500.0000 mg | ORAL_TABLET | Freq: Four times a day (QID) | ORAL | 2 refills | Status: DC | PRN

## 2016-09-13 MED ORDER — OXYCODONE-ACETAMINOPHEN 5-325 MG PO TABS: 1.0000 | ORAL_TABLET | Freq: Three times a day (TID) | ORAL | 0 refills | Status: DC | PRN

## 2016-09-13 NOTE — Progress Notes (Signed)
Office Visit Note   Patient: Kathleen Rodriguez AgeLouise H Ladnier           Date of Birth: 1965-12-05           MRN: [id number] Visit Date: 09/13/2016              Requested by: Jaclyn ShaggyEnobong Amao, MD [address] [place], [place] [id number] PCP: Jaclyn ShaggyEnobong, Amao, MD   Assessment & Plan: Visit Diagnoses:  1. Primary osteoarthritis of left knee   2. Status post total left knee replacement     Plan: 2 week TKA follow up plan  Patient presents for 2 week follow up after total knee replacement.  The incision is clean, dry, and intact and healing very well. There is no drainage, erythema, or signs of infection. Motion is progressing nicely.  We have encouraged continued use of TED hose as well as aspirin for DVT prophylaxis, and to continue with physical therapy exercises to work on strength and endurance. Patient is progressing well. Reminders were given about signs to be aware of including redness, drainage, increased pain, fevers, calf pain, shortness of breath, or any concern should generate a phone call or a return to see us immediately. Will plan to follow up at 6 weeks post op for next evaluation with radiographs at that time including 2 views of left knee.   Follow-Up Instructions: Return in about 4 weeks (around 10/11/2016) for recheck left TKA.   Orders:  Orders Placed This Encounter  Procedures  . XR Knee 1-2 Views Left   Meds ordered this encounter  Medications  . oxyCODONE-acetaminophen (PERCOCET) 5-325 MG tablet    Sig: Take 1-2 tablets by mouth every 8 (eight) hours as needed for severe pain.    Dispense:  60 tablet    Refill:  0  . methocarbamol (ROBAXIN) 500 MG tablet    Sig: Take 1 tablet (500 mg total) by mouth every 6 (six) hours as needed for muscle spasms.    Dispense:  30 tablet    Refill:  2      Procedures: No procedures performed   Clinical Data: No additional findings.   Subjective: Chief Complaint  Patient presents with  . Right Knee - Pain    2 week postop  visit for left TKA.  Doing well.  Progressing with PT.  No real complaints    Review of Systems   Objective: Vital Signs: LMP 08/31/2016 (Exact Date)   Physical Exam  Left Knee Exam   Comments:  Well healed incision.  Postop swelling. Calf nontender.  Flexion to 75 degrees      Specialty Comments:  No specialty comments available.  Imaging: Xr Knee 1-2 Views Left  Result Date: 09/13/2016 Stable left TKA.    PMFS History: Patient Active Problem List   Diagnosis Date Noted  . Osteoarthritis of left knee 09/01/2016  . 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 Knee pain, 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  . Status asthmaticus 06/24/2016  . Acute respiratory failure (HCC) 06/24/2016  . Acute asthma exacerbation 05/12/2016  . Acute respiratory failure with hypoxia (HCC) 05/12/2016  . Tobacco abuse   . Primary osteoarthritis of left knee 03/09/2016  . Asthma with status asthmaticus 01/27/2016  . Acute respiratory distress 01/27/2016  . Non compliance with medical treatment 08/19/2015  . Anemia, iron deficiency 08/12/2015  . Essential hypertension 07/25/2015  . Sinus tachycardia (HCC) 07/25/2015  . Acute respiratory failure with hypercapnia (HCC) 07/24/2015  . COPD (chronic obstructive pulmonary disease) (HCC) 12/28/2014  .  Tracheobronchitis 12/28/2014  . Dysfunctional uterine bleeding 12/28/2014  . Anemia 12/28/2014  . Pleuritic chest pain 05/16/2014  . Morbid obesity (HCC) 05/16/2014  . Chronic cough 05/01/2014  . Upper airway cough syndrome 05/01/2014  . Vaginal candidiasis 04/09/2014  . GERD (gastroesophageal reflux disease) 04/08/2014  . Medically noncompliant 04/07/2014  . Asthma exacerbation 04/06/2014  . CAP (community acquired pneumonia) 04/06/2014  . Chest pain 04/01/2014  . Hypertension 09/24/2013  . OSA (obstructive sleep apnea) 03/20/2013  . Uncontrolled persistent asthma 02/05/2013   Past Medical History:  Diagnosis Date  . Arthritis    "knees, lower  back; legs, ankles" (01/27/2016)  . Asthma    followed by Dr. Craige CottaSood  . CHF (congestive heart failure) (HCC) 2016   "when I went into a coma"  . Cocaine abuse   . 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 Dyspnea   . GERD (gastroesophageal reflux disease)   . Hypertension    "doctor took me off RX in 2016" (01/27/2016)  . Influenza B April 2014   Complicated by multi-organ failure  . OSA on CPAP "since " 03/20/2013  . Pneumonia 2016  . Required emergent intubation    asthma exacerbation in 2016  . Tobacco abuse     Family History  Problem Relation Age of Onset  . Hypertension Mother   . HIV/AIDS Father     Past Surgical History:  Procedure Laterality Date  . BREAST SURGERY Right 1980   as a teenager , cyst was benign  . CESAREAN SECTION  2006  . LACERATION REPAIR Right ~ 1997   "tried to cut myself"  . TOTAL KNEE ARTHROPLASTY Left 09/01/2016   Procedure: LEFT TOTAL KNEE ARTHROPLASTY;  Surgeon: Tarry KosNaiping M Jaelan Rasheed, MD;  Location: [facility] OR;  Service: Orthopedics;  Laterality: Left;  . TUBAL LIGATION  2006   Social History   Occupational History  . Not on file.   Social History Main Topics  . Smoking status: Former Smoker    Packs/day: 0.25    Years: 20.00    Types: Cigarettes    Quit date: 01/31/2013  . Smokeless tobacco: Never Used  . Alcohol use No     Comment: 01/26/2006 "clean from drinking since 2007"  . Drug use: No     Comment: 01/27/2016 "clean from smoking crack since 2007"  . Sexual activity: Yes    Birth control/ protection: None, Surgical

## 2016-09-13 NOTE — Telephone Encounter (Signed)
Patient took Rx this morning. Debbie advised pt over the phone

## 2016-09-20 ENCOUNTER — Telehealth (INDEPENDENT_AMBULATORY_CARE_PROVIDER_SITE_OTHER): Payer: Self-pay | Admitting: Radiology

## 2016-09-20 NOTE — Telephone Encounter (Signed)
Spoke to patient and recommended RICE.  If not better by tomorrow, needs to go to ER for DVT study.  Denies any cp or sob tonight.

## 2016-09-20 NOTE — Telephone Encounter (Signed)
Patient called and said she is having pain/numbness/hardness/heat on lateral knee, s/p TKA.  She says pain is excrutiating, she wonders if she has a blood clot.  She does have pain that radiates into calf to foot and up into thigh as well.  I offered 2pm workin today for eval and she does not have transportation, she Tamber Burtch have to wait til Friday for transportation.  I told her I would have you advise and then I can call her back.  Pls advise?  thanks

## 2016-09-23 ENCOUNTER — Telehealth: Payer: Self-pay

## 2016-09-23 NOTE — Telephone Encounter (Signed)
Writer returned patient's call who states that she has LVM for financial counselor and has not heard back from her.  Patient would like to have home care and would like to know if she is financially covered to request this service.  Message forwarded to Diane.

## 2016-09-28 ENCOUNTER — Telehealth (INDEPENDENT_AMBULATORY_CARE_PROVIDER_SITE_OTHER): Payer: Self-pay | Admitting: Orthopaedic Surgery

## 2016-09-28 MED ORDER — OXYCODONE-ACETAMINOPHEN 5-325 MG PO TABS: ORAL_TABLET | ORAL | 0 refills | Status: DC

## 2016-09-28 NOTE — Telephone Encounter (Signed)
Pending signature, will be ready for pickup Thursday morning 09/29/16

## 2016-09-28 NOTE — Telephone Encounter (Signed)
Refill #30. 1-2 tabs po bid prn

## 2016-09-28 NOTE — Telephone Encounter (Signed)
Patient called needing Rx refilled (oxycodone) The number to contact her is [phone]

## 2016-09-28 NOTE — Telephone Encounter (Signed)
Please advise. Thanks.  

## 2016-09-29 ENCOUNTER — Other Ambulatory Visit (INDEPENDENT_AMBULATORY_CARE_PROVIDER_SITE_OTHER): Payer: Self-pay | Admitting: Orthopaedic Surgery

## 2016-10-11 ENCOUNTER — Ambulatory Visit (INDEPENDENT_AMBULATORY_CARE_PROVIDER_SITE_OTHER): Payer: Self-pay | Admitting: Orthopaedic Surgery

## 2016-10-14 ENCOUNTER — Ambulatory Visit (INDEPENDENT_AMBULATORY_CARE_PROVIDER_SITE_OTHER): Payer: Self-pay | Admitting: Orthopaedic Surgery

## 2016-10-14 ENCOUNTER — Ambulatory Visit (INDEPENDENT_AMBULATORY_CARE_PROVIDER_SITE_OTHER): Payer: Self-pay

## 2016-10-14 ENCOUNTER — Encounter (INDEPENDENT_AMBULATORY_CARE_PROVIDER_SITE_OTHER): Payer: Self-pay | Admitting: Orthopaedic Surgery

## 2016-10-14 DIAGNOSIS — M1712 Unilateral primary osteoarthritis, left knee: Secondary | ICD-10-CM

## 2016-10-14 MED ORDER — SULFAMETHOXAZOLE-TRIMETHOPRIM 800-160 MG PO TABS: 1.0000 | ORAL_TABLET | Freq: Two times a day (BID) | ORAL | 0 refills | Status: DC

## 2016-10-14 MED ORDER — HYDROCODONE-ACETAMINOPHEN 5-325 MG PO TABS: 1.0000 | ORAL_TABLET | Freq: Two times a day (BID) | ORAL | 0 refills | Status: DC | PRN

## 2016-10-15 NOTE — Progress Notes (Signed)
Patient is 6 weeks status post left total knee replacement. She still has some throbbing pain in feels heavy in the knee region. She is overall satisfied with her progress and her knee replacement. She did have a small area where a scab unroofed at the distal extent of the incision and per patient there was some pus but this is stopped after she is placed Neosporin on it. Her range of motion is progressing. Home health physical therapy has ended. Physical exam shows a well-healed scar. I do not appreciate any signs of infection. I am going to have her continue with Neosporin and put her on a week of Bactrim for likely a suture abscess. Her x-rays are stable today with good alignment. We'll set her up with outpatient physical therapy at [facility]. I will see her back in 6 weeks for recheck. X-rays not needed unless she is having issues

## 2016-10-20 ENCOUNTER — Other Ambulatory Visit (INDEPENDENT_AMBULATORY_CARE_PROVIDER_SITE_OTHER): Payer: Self-pay | Admitting: Orthopaedic Surgery

## 2016-10-20 NOTE — Telephone Encounter (Signed)
Please advise 

## 2016-10-20 NOTE — Telephone Encounter (Signed)
Patient called needing Rx refilled (vicodin) The number to contact her is [phone]

## 2016-10-20 NOTE — Telephone Encounter (Signed)
Refill #20. 1 daily prn

## 2016-10-21 MED ORDER — HYDROCODONE-ACETAMINOPHEN 5-325 MG PO TABS: 1.0000 | ORAL_TABLET | Freq: Every day | ORAL | 0 refills | Status: DC

## 2016-10-21 NOTE — Addendum Note (Signed)
Addended by: Rogers SeedsYEATTS, Broly Hatfield M on: 10/21/2016 11:57 AM   Modules accepted: Orders

## 2016-10-21 NOTE — Telephone Encounter (Signed)
I entered #20 and called patient. She states the 5/325 is not strong enough, that she is hurting. And that she needs atleast 60 because she does not come back to see you for 6 weeks. The numbness on the outside of her knee is killing her. She also wants you to know she has not been contacted by the other physical therapist you wanted her to see.   I did advise Dr. Roda ShuttersXu is in clinic this afternoon and may not get to answer this message prior to 5:00pm. Patient does know that we are closed on Monday for the holiday.

## 2016-10-21 NOTE — Telephone Encounter (Signed)
I called patient and advised. 

## 2016-10-21 NOTE — Telephone Encounter (Signed)
6 weeks postop.  Needs to take po nsaids and tylenol

## 2016-11-01 ENCOUNTER — Telehealth: Payer: Self-pay | Admitting: Family Medicine

## 2016-11-01 ENCOUNTER — Telehealth: Payer: Self-pay | Admitting: Pulmonary Disease

## 2016-11-01 NOTE — Telephone Encounter (Signed)
Patient is requesting a prescription for her knee replacement, she needs some sort of pain medication...  She will also like to receive a call from the nurse to discuss a letter she needs from her PCP in regards to her health to submit to her disability attorney.

## 2016-11-01 NOTE — Telephone Encounter (Signed)
If she had a knee replacement she should be receiving pain medications from her orthopedic.

## 2016-11-01 NOTE — Telephone Encounter (Signed)
Spoke with pt, requesting a letter from our office documenting her current health for her disability forms.  I advised pt that VS has not seen her since 2015 and that she has not been seen in our practice since 06/2016, and that she cancelled her rov with VS in 08/2016.  Pt states she cx'ed appt's d/t knee sx, and is rescheduled for 11/2016.  Pt requests I see if VS can write this letter for her.  VS please advise.  Thanks.

## 2016-11-03 ENCOUNTER — Telehealth: Payer: Self-pay

## 2016-11-03 DIAGNOSIS — N920 Excessive and frequent menstruation with regular cycle: Secondary | ICD-10-CM

## 2016-11-03 NOTE — Telephone Encounter (Signed)
Writer called patient back regarding request for pain meds.  Patient stated understanding that MD will not prescribe meds for surgery.  Surgeon refuses to give patient any more pain meds.  Patient is also requesting a referral to GYN for heavy periods and cramping, she "would like a hysterectomy".

## 2016-11-04 ENCOUNTER — Ambulatory Visit: Payer: Medicaid Other | Admitting: Physical Therapy

## 2016-11-04 NOTE — Telephone Encounter (Signed)
Done

## 2016-11-04 NOTE — Telephone Encounter (Signed)
Attempted to contact pt. Received a message that my call could not be completed as dialed x2. Will try back. 

## 2016-11-04 NOTE — Addendum Note (Signed)
Addended by: Jaclyn ShaggyAMAO, Quinley Nesler on: 11/04/2016 12:11 PM   Modules accepted: Orders

## 2016-11-04 NOTE — Telephone Encounter (Signed)
VS please advise. Thanks! 

## 2016-11-04 NOTE — Telephone Encounter (Signed)
Can you have her check if there is a form that I need to complete, or just write a letter detailing her health status.

## 2016-11-12 ENCOUNTER — Encounter (HOSPITAL_COMMUNITY): Payer: Self-pay | Admitting: Emergency Medicine

## 2016-11-12 ENCOUNTER — Emergency Department (HOSPITAL_COMMUNITY)
Admission: EM | Admit: 2016-11-12 | Discharge: 2016-11-12 | Disposition: A | Discharge status: Home / Self Care | Payer: Medicaid Other | Attending: Emergency Medicine | Admitting: Emergency Medicine

## 2016-11-12 ENCOUNTER — Emergency Department (HOSPITAL_COMMUNITY): Payer: Medicaid Other

## 2016-11-12 DIAGNOSIS — S86812A Strain of other muscle(s) and tendon(s) at lower leg level, left leg, initial encounter: Secondary | ICD-10-CM | POA: Insufficient documentation

## 2016-11-12 DIAGNOSIS — Y939 Activity, unspecified: Secondary | ICD-10-CM | POA: Insufficient documentation

## 2016-11-12 DIAGNOSIS — Y929 Unspecified place or not applicable: Secondary | ICD-10-CM | POA: Insufficient documentation

## 2016-11-12 DIAGNOSIS — J45909 Unspecified asthma, uncomplicated: Secondary | ICD-10-CM | POA: Insufficient documentation

## 2016-11-12 DIAGNOSIS — X58XXXA Exposure to other specified factors, initial encounter: Secondary | ICD-10-CM | POA: Insufficient documentation

## 2016-11-12 DIAGNOSIS — Z7982 Long term (current) use of aspirin: Secondary | ICD-10-CM | POA: Insufficient documentation

## 2016-11-12 DIAGNOSIS — Z87891 Personal history of nicotine dependence: Secondary | ICD-10-CM | POA: Insufficient documentation

## 2016-11-12 DIAGNOSIS — I509 Heart failure, unspecified: Secondary | ICD-10-CM | POA: Insufficient documentation

## 2016-11-12 DIAGNOSIS — Z79899 Other long term (current) drug therapy: Secondary | ICD-10-CM | POA: Insufficient documentation

## 2016-11-12 DIAGNOSIS — S86912A Strain of unspecified muscle(s) and tendon(s) at lower leg level, left leg, initial encounter: Secondary | ICD-10-CM

## 2016-11-12 DIAGNOSIS — Z96652 Presence of left artificial knee joint: Secondary | ICD-10-CM | POA: Insufficient documentation

## 2016-11-12 DIAGNOSIS — I110 Hypertensive heart disease with heart failure: Secondary | ICD-10-CM | POA: Insufficient documentation

## 2016-11-12 DIAGNOSIS — Y999 Unspecified external cause status: Secondary | ICD-10-CM | POA: Insufficient documentation

## 2016-11-12 DIAGNOSIS — Z91040 Latex allergy status: Secondary | ICD-10-CM | POA: Insufficient documentation

## 2016-11-12 MED ORDER — HYDROCODONE-ACETAMINOPHEN 5-325 MG PO TABS
2.0000 | ORAL_TABLET | Freq: Once | ORAL | Status: AC
  Administered 2016-11-12: 2 via ORAL
  Filled 2016-11-12: qty 2

## 2016-11-12 MED ORDER — IBUPROFEN 400 MG PO TABS
600.0000 mg | ORAL_TABLET | Freq: Once | ORAL | Status: AC
  Administered 2016-11-12: 600 mg via ORAL
  Filled 2016-11-12: qty 1

## 2016-11-12 MED ORDER — HYDROCODONE-ACETAMINOPHEN 5-325 MG PO TABS: 2.0000 | ORAL_TABLET | ORAL | 0 refills | Status: DC | PRN

## 2016-11-12 MED ORDER — NAPROXEN 500 MG PO TABS: 500.0000 mg | ORAL_TABLET | Freq: Two times a day (BID) | ORAL | 0 refills | Status: DC | PRN

## 2016-11-12 NOTE — ED Provider Notes (Signed)
[facility]-EM[facility]ENCY DEPT Provider Note   CSN: [id number] Arrival date & time: 11/12/16  1315     History   Chief Complaint Chief Complaint  Patient presents with  . Knee Pain  . Back Pain    HPI Star AgeLouise H Tubby is a 51 y.o. female.  Pt with left knee pain worsening over weeks. Ache.  SImilar to previous but pain medicines no longer working.  Pt has ortho fup.  Pt had knee replacement approx 7 wks ago.  No new significant injury.  No fevers.  No systemic sxs.       Past Medical History:  Diagnosis Date  . Arthritis    "knees, lower back; legs, ankles" (01/27/2016)  . Asthma    followed by Dr. Craige CottaSood  . CHF (congestive heart failure) (HCC) 2016   "when I went into a coma"  . Cocaine abuse   . 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 Dyspnea   . GERD (gastroesophageal reflux disease)   . Hypertension    "doctor took me off RX in 2016" (01/27/2016)  . Influenza B April 2014   Complicated by multi-organ failure  . OSA on CPAP "since " 03/20/2013  . Pneumonia 2016  . Required emergent intubation    asthma exacerbation in 2016  . Tobacco abuse     Patient Active Problem List   Diagnosis Date Noted  . Osteoarthritis of left knee 09/01/2016  . 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 Knee pain, 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  . Status asthmaticus 06/24/2016  . Acute respiratory failure (HCC) 06/24/2016  . Acute asthma exacerbation 05/12/2016  . Acute respiratory failure with hypoxia (HCC) 05/12/2016  . Tobacco abuse   . Primary osteoarthritis of left knee 03/09/2016  . Asthma with status asthmaticus 01/27/2016  . Acute respiratory distress 01/27/2016  . Non compliance with medical treatment 08/19/2015  . Anemia, iron deficiency 08/12/2015  . Essential hypertension 07/25/2015  . Sinus tachycardia (HCC) 07/25/2015  . Acute respiratory failure with hypercapnia (HCC) 07/24/2015  . COPD (chronic obstructive pulmonary disease) (HCC) 12/28/2014  . Tracheobronchitis 12/28/2014  .  Dysfunctional uterine bleeding 12/28/2014  . Anemia 12/28/2014  . Pleuritic chest pain 05/16/2014  . Morbid obesity (HCC) 05/16/2014  . Chronic cough 05/01/2014  . Upper airway cough syndrome 05/01/2014  . Vaginal candidiasis 04/09/2014  . GERD (gastroesophageal reflux disease) 04/08/2014  . Medically noncompliant 04/07/2014  . Asthma exacerbation 04/06/2014  . CAP (community acquired pneumonia) 04/06/2014  . Chest pain 04/01/2014  . Hypertension 09/24/2013  . OSA (obstructive sleep apnea) 03/20/2013  . Uncontrolled persistent asthma 02/05/2013    Past Surgical History:  Procedure Laterality Date  . BREAST SURGERY Right 1980   as a teenager , cyst was benign  . CESAREAN SECTION  2006  . LACERATION REPAIR Right ~ 1997   "tried to cut myself"  . TOTAL KNEE ARTHROPLASTY Left 09/01/2016   Procedure: LEFT TOTAL KNEE ARTHROPLASTY;  Surgeon: Tarry KosNaiping M Xu, MD;  Location: [facility] OR;  Service: Orthopedics;  Laterality: Left;  . TUBAL LIGATION  2006    OB History    No data available       Home Medications    Prior to Admission medications   Medication Sig Start Date End Date Taking? Authorizing Provider  albuterol (VENTOLIN HFA) 108 (90 Base) MCG/ACT inhaler Inhale 2 puffs into the lungs every 6 (six) hours as needed for wheezing. 07/27/16   Jaclyn ShaggyEnobong Amao, MD  aspirin EC 325 MG tablet Take 1 tablet (325 mg total)  by mouth 2 (two) times daily. 09/01/16   Naiping Donnelly Stager, MD  budesonide (PULMICORT) 0.25 MG/2ML nebulizer solution Take 2 mLs (0.25 mg total) by nebulization 2 (two) times daily. Patient not taking: Reported on 09/13/2016 03/09/16   Tammy S Parrett, NP  fluticasone (FLONASE) 50 MCG/ACT nasal spray Place 2 sprays into the nose daily. 02/21/13   Bernadene Person, NP  HYDROcodone-acetaminophen (NORCO) 5-325 MG tablet Take 1 tablet by mouth daily. PRN PAIN 10/21/16   Tarry Kos, MD  methocarbamol (ROBAXIN) 500 MG tablet Take 1 tablet (500 mg total) by mouth every 6 (six) hours as  needed for muscle spasms. 09/13/16   Naiping Donnelly Stager, MD  methocarbamol (ROBAXIN) 750 MG tablet Take 1 tablet (750 mg total) by mouth 2 (two) times daily as needed for muscle spasms. Patient not taking: Reported on 09/13/2016 09/01/16   Tarry Kos, MD  ondansetron (ZOFRAN) 4 MG tablet Take 1-2 tablets (4-8 mg total) by mouth every 8 (eight) hours as needed for nausea or vomiting. Patient not taking: Reported on 09/13/2016 09/01/16   Tarry Kos, MD  ondansetron (ZOFRAN) 4 MG tablet Take 1 tablet (4 mg total) by mouth every 6 (six) hours as needed for nausea. Patient not taking: Reported on 09/13/2016 09/03/16   Eldred Manges, MD  oxyCODONE (OXY IR/ROXICODONE) 5 MG immediate release tablet Take 1-3 tablets (5-15 mg total) by mouth every 4 (four) hours as needed. Patient not taking: Reported on 09/13/2016 09/01/16   Tarry Kos, MD  oxyCODONE (OXYCONTIN) 10 mg 12 hr tablet Take 1 tablet (10 mg total) by mouth every 12 (twelve) hours. Patient not taking: Reported on 09/13/2016 09/01/16   Tarry Kos, MD  oxyCODONE-acetaminophen (PERCOCET) 5-325 MG tablet 1-2 tabs po bid prn 09/28/16   Tarry Kos, MD  senna-docusate (SENOKOT S) 8.6-50 MG tablet Take 1 tablet by mouth at bedtime as needed. Patient not taking: Reported on 09/13/2016 09/01/16   Tarry Kos, MD  sulfamethoxazole-trimethoprim (BACTRIM DS,SEPTRA DS) 800-160 MG tablet Take 1 tablet by mouth 2 (two) times daily. Patient not taking: Reported on 09/13/2016 09/01/16   Tarry Kos, MD  sulfamethoxazole-trimethoprim (BACTRIM DS,SEPTRA DS) 800-160 MG tablet Take 1 tablet by mouth 2 (two) times daily. 10/14/16   Tarry Kos, MD    Family History Family History  Problem Relation Age of Onset  . Hypertension Mother   . HIV/AIDS Father     Social History Social History  Substance Use Topics  . Smoking status: Former Smoker    Packs/day: 0.25    Years: 20.00    Types: Cigarettes    Quit date: 01/31/2013  . Smokeless tobacco: Never Used  .  Alcohol use No     Comment: 01/26/2006 "clean from drinking since 2007"     Allergies   Latex; Tomato; and Wool alcohol [lanolin]   Review of Systems Review of Systems  Constitutional: Negative for chills and fever.  HENT: Negative for congestion.   Respiratory: Negative for shortness of breath.   Cardiovascular: Negative for chest pain.  Musculoskeletal: Positive for gait problem and joint swelling.  Neurological: Negative for weakness and numbness.     Physical Exam Updated Vital Signs BP 134/75   Pulse 110   Temp 98.2 F (36.8 C) (Oral)   Resp 18   Ht 5\' 6"  (1.676 m)   LMP 09/27/2016   SpO2 99%   Physical Exam  Constitutional: She appears well-developed and well-nourished. No distress.  HENT:  Head: Normocephalic and atraumatic.  Cardiovascular: Normal rate.   Pulmonary/Chest: Effort normal.  Musculoskeletal: Normal range of motion. She exhibits edema. She exhibits no deformity.  Pt has mild effusion left knee, pain with flexion, no warmth or erythema, full rom with pain, no obvious laxity, no rash  Neurological: She is alert.  Skin: Skin is warm.  Nursing note and vitals reviewed.    ED Treatments / Results  Labs (all labs ordered are listed, but only abnormal results are displayed) Labs Reviewed - No data to display  EKG  EKG Interpretation None       Radiology No results found. Dg Knee Complete 4 Views Left  Result Date: 11/12/2016 CLINICAL DATA:  Hit left knee on Matt, pain EXAM: LEFT KNEE - COMPLETE 4+ VIEW COMPARISON:  09/01/2016 FINDINGS: The patient is status post left knee replacement. Intact hardware. No fracture or dislocation. Small moderate suprapatellar joint effusion. IMPRESSION: Status post left knee replacement without acute osseous abnormality. Small to moderate suprapatellar effusion Electronically Signed   By: Jasmine Pang M.D.   On: 11/12/2016 18:58    Procedures Procedures (including critical care time)  Medications Ordered in  ED Medications  HYDROcodone-acetaminophen (NORCO/VICODIN) 5-325 MG per tablet 2 tablet (2 tablets Oral Given 11/12/16 1749)  ibuprofen (ADVIL,MOTRIN) tablet 600 mg (600 mg Oral Given 11/12/16 1749)     Initial Impression / Assessment and Plan / ED Course  I have reviewed the triage vital signs and the nursing notes.  Pertinent labs & imaging results that were available during my care of the patient were reviewed by me and considered in my medical decision making (see chart for details).    Pt with gradually worsening pain since surgery, no signs of infection.  Xrays unremarkable.  Discussed fup with ortho.    Pain meds given .  Results and differential diagnosis were discussed with the patient/parent/guardian. Xrays were independently reviewed by myself.  Close follow up outpatient was discussed, comfortable with the plan.   Medications  HYDROcodone-acetaminophen (NORCO/VICODIN) 5-325 MG per tablet 2 tablet (2 tablets Oral Given 11/12/16 1749)  ibuprofen (ADVIL,MOTRIN) tablet 600 mg (600 mg Oral Given 11/12/16 1749)    Vitals:   11/12/16 1343 11/12/16 1344 11/12/16 1651  BP: 135/95  134/75  Pulse: 112  110  Resp: 17  18  Temp: 98.2 F (36.8 C)    TempSrc: Oral    SpO2: 99%  99%  Height:  5\' 6"  (1.676 m)     Final diagnoses:  Knee strain, left, initial encounter     Final Clinical Impressions(s) / ED Diagnoses   Final diagnoses:  None    New Prescriptions New Prescriptions   No medications on file     Blane Ohara, MD 11/20/16 1603

## 2016-11-12 NOTE — ED Notes (Signed)
Pt. Refused a wheelchair stated, more comfortable to walk around.

## 2016-11-12 NOTE — ED Triage Notes (Signed)
Pt. Stated, I had a knee replacement 7 weeks ago and the pain is worse and Im having lower back pain also.  All this is from 2 days ago, I need something stronger for the pain.

## 2016-11-12 NOTE — Discharge Instructions (Signed)
Follow-up with your orthopedic doctor. Use her cane as needed. For severe pain take norco or vicodin however realize they have the potential for addiction and it can make you sleepy and has tylenol in it.  No operating machinery while taking.  If you were given medicines take as directed.  If you are on coumadin or contraceptives realize their levels and effectiveness is altered by many different medicines.  If you have any reaction (rash, tongues swelling, other) to the medicines stop taking and see a physician.    If your blood pressure was elevated in the ER make sure you follow up for management with a primary doctor or return for chest pain, shortness of breath or stroke symptoms.  Please follow up as directed and return to the ER or see a physician for new or worsening symptoms.  Thank you. Vitals:   11/12/16 1343 11/12/16 1344 11/12/16 1651  BP: 135/95  134/75  Pulse: 112  110  Resp: 17  18  Temp: 98.2 F (36.8 C)    TempSrc: Oral    SpO2: 99%  99%  Height:  5\' 6"  (1.676 m)

## 2016-11-22 ENCOUNTER — Telehealth (INDEPENDENT_AMBULATORY_CARE_PROVIDER_SITE_OTHER): Payer: Self-pay | Admitting: Orthopaedic Surgery

## 2016-11-22 NOTE — Telephone Encounter (Signed)
Pt called she is still having pain in her knee, and it is getting be be too much, she's requesting pain meds.  [phone]

## 2016-11-22 NOTE — Telephone Encounter (Signed)
Tramadol 50. #30

## 2016-11-22 NOTE — Telephone Encounter (Signed)
Please advise 

## 2016-11-23 ENCOUNTER — Telehealth (INDEPENDENT_AMBULATORY_CARE_PROVIDER_SITE_OTHER): Payer: Self-pay

## 2016-11-23 MED ORDER — TRAMADOL HCL 50 MG PO TABS: 50.0000 mg | ORAL_TABLET | Freq: Every day | ORAL | 0 refills | Status: AC

## 2016-11-23 NOTE — Addendum Note (Signed)
Addended by: Albertina ParrGARCIA, Charae Depaolis on: 11/23/2016 09:56 AM   Modules accepted: Orders

## 2016-11-23 NOTE — Telephone Encounter (Signed)
She can have 10 tablets. But last rx since she is almost 3 months postop.  Needs to take naproxen and tylenol also

## 2016-11-23 NOTE — Telephone Encounter (Signed)
LMOM Rx sent to her pharm. [facility] [address]

## 2016-11-23 NOTE — Telephone Encounter (Signed)
Pt states she does not want Tramadol (prescribed by Dr Roda ShuttersXu) she states that does not help at all. She states she went to the hospital 11/12/16. They gave her Naproxen and hydrocodone. She said they hydrocodone helps.

## 2016-11-24 ENCOUNTER — Ambulatory Visit (INDEPENDENT_AMBULATORY_CARE_PROVIDER_SITE_OTHER): Payer: Self-pay | Admitting: Orthopaedic Surgery

## 2016-11-24 MED ORDER — HYDROCODONE-ACETAMINOPHEN 5-325 MG PO TABS: 1.0000 | ORAL_TABLET | Freq: Every day | ORAL | 0 refills | Status: DC

## 2016-11-24 NOTE — Telephone Encounter (Signed)
yes

## 2016-11-24 NOTE — Telephone Encounter (Signed)
Patient also said if she can get a RF on Naproxen and Robaxin ?

## 2016-11-24 NOTE — Telephone Encounter (Signed)
Called pt to advise on message below, she will come by to pick up Rx

## 2016-11-24 NOTE — Addendum Note (Signed)
Addended by: Albertina ParrGARCIA, Antonis Lor on: 11/24/2016 08:20 AM   Modules accepted: Orders

## 2016-11-25 MED ORDER — METHOCARBAMOL 500 MG PO TABS: 500.0000 mg | ORAL_TABLET | Freq: Four times a day (QID) | ORAL | 2 refills | Status: DC | PRN

## 2016-11-25 MED ORDER — NAPROXEN 500 MG PO TABS: 500.0000 mg | ORAL_TABLET | Freq: Two times a day (BID) | ORAL | 0 refills | Status: DC | PRN

## 2016-11-25 MED ORDER — METHOCARBAMOL 500 MG PO TABS: 500.0000 mg | ORAL_TABLET | Freq: Four times a day (QID) | ORAL | 0 refills | Status: DC | PRN

## 2016-11-25 NOTE — Telephone Encounter (Signed)
rx sent into her pharm, pt aware

## 2016-11-25 NOTE — Addendum Note (Signed)
Addended by: Albertina ParrGARCIA, Muneeb Veras on: 11/25/2016 10:33 AM   Modules accepted: Orders

## 2016-12-05 ENCOUNTER — Encounter: Payer: Self-pay | Admitting: General Practice

## 2016-12-07 ENCOUNTER — Encounter: Payer: Medicaid Other | Admitting: Obstetrics & Gynecology

## 2016-12-09 ENCOUNTER — Encounter: Payer: Medicaid Other | Admitting: Obstetrics & Gynecology

## 2016-12-16 ENCOUNTER — Ambulatory Visit: Payer: Self-pay | Admitting: Pulmonary Disease

## 2016-12-27 ENCOUNTER — Telehealth: Payer: Self-pay | Admitting: Pulmonary Disease

## 2016-12-27 MED ORDER — PREDNISONE 10 MG PO TABS: ORAL_TABLET | ORAL | 0 refills | Status: DC

## 2016-12-27 NOTE — Telephone Encounter (Signed)
Can send script for prednisone 10 mg pill >> 3 pills daily for 2 days, 2 pills daily for 2 days, 1 pill daily for 2 days.  #12 with no refills.  She should call to schedule appt if not improved after prednisone course.

## 2016-12-27 NOTE — Telephone Encounter (Signed)
Spoke with pt. She is aware of VS response. Rx has been sent in. Nothing further was needed.

## 2016-12-27 NOTE — Telephone Encounter (Signed)
Spoke with pt. States that she is having issues with breathing. Reports SOB, chest tightness and wheezing. Denies coughing or fever. SOB is worse with activity. Has been using albuterol and budesonide with no relief. Advised pt that she would possibly need an appointment. She became argumentative when this was advised. Pt would like to have prednisone taper.  VS - please advise. Thanks.

## 2017-01-26 NOTE — Telephone Encounter (Signed)
[employer] discount expired 11/05/16.  I do not know if [facility] provides this service.

## 2017-02-02 ENCOUNTER — Ambulatory Visit: Payer: Medicaid Other | Admitting: Family Medicine

## 2017-02-03 ENCOUNTER — Ambulatory Visit: Payer: Medicaid Other | Admitting: Family Medicine

## 2017-02-15 ENCOUNTER — Other Ambulatory Visit: Payer: Medicaid Other | Admitting: Family Medicine

## 2017-02-18 ENCOUNTER — Emergency Department (HOSPITAL_COMMUNITY): Payer: Medicaid Other

## 2017-02-18 ENCOUNTER — Encounter (HOSPITAL_COMMUNITY): Payer: Self-pay | Admitting: Emergency Medicine

## 2017-02-18 ENCOUNTER — Emergency Department (HOSPITAL_COMMUNITY)
Admission: EM | Admit: 2017-02-18 | Discharge: 2017-02-18 | Disposition: A | Discharge status: Home / Self Care | Payer: Medicaid Other | Attending: Emergency Medicine | Admitting: Emergency Medicine

## 2017-02-18 DIAGNOSIS — Z91040 Latex allergy status: Secondary | ICD-10-CM | POA: Insufficient documentation

## 2017-02-18 DIAGNOSIS — J4521 Mild intermittent asthma with (acute) exacerbation: Secondary | ICD-10-CM | POA: Insufficient documentation

## 2017-02-18 DIAGNOSIS — Z87891 Personal history of nicotine dependence: Secondary | ICD-10-CM | POA: Insufficient documentation

## 2017-02-18 DIAGNOSIS — R0602 Shortness of breath: Secondary | ICD-10-CM | POA: Diagnosis present

## 2017-02-18 DIAGNOSIS — Z96652 Presence of left artificial knee joint: Secondary | ICD-10-CM | POA: Insufficient documentation

## 2017-02-18 DIAGNOSIS — Z79899 Other long term (current) drug therapy: Secondary | ICD-10-CM | POA: Insufficient documentation

## 2017-02-18 DIAGNOSIS — I509 Heart failure, unspecified: Secondary | ICD-10-CM | POA: Insufficient documentation

## 2017-02-18 DIAGNOSIS — J449 Chronic obstructive pulmonary disease, unspecified: Secondary | ICD-10-CM | POA: Insufficient documentation

## 2017-02-18 DIAGNOSIS — I110 Hypertensive heart disease with heart failure: Secondary | ICD-10-CM | POA: Diagnosis not present

## 2017-02-18 DIAGNOSIS — J45901 Unspecified asthma with (acute) exacerbation: Secondary | ICD-10-CM

## 2017-02-18 LAB — CBC WITH DIFFERENTIAL/PLATELET
BASOS PCT: 0 %
Basophils Absolute: 0 10*3/uL (ref 0.0–0.1)
EOS ABS: 0.5 10*3/uL (ref 0.0–0.7)
EOS PCT: 5 %
HCT: 36.2 % (ref 36.0–46.0)
HEMOGLOBIN: 11.3 g/dL — AB (ref 12.0–15.0)
Lymphocytes Relative: 39 %
Lymphs Abs: 3.5 10*3/uL (ref 0.7–4.0)
MCH: 25.3 pg — ABNORMAL LOW (ref 26.0–34.0)
MCHC: 31.2 g/dL (ref 30.0–36.0)
MCV: 81.2 fL (ref 78.0–100.0)
Monocytes Absolute: 0.7 10*3/uL (ref 0.1–1.0)
Monocytes Relative: 8 %
NEUTROS PCT: 48 %
Neutro Abs: 4.4 10*3/uL (ref 1.7–7.7)
PLATELETS: 342 10*3/uL (ref 150–400)
RBC: 4.46 MIL/uL (ref 3.87–5.11)
RDW: 18.4 % — ABNORMAL HIGH (ref 11.5–15.5)
WBC: 9.2 10*3/uL (ref 4.0–10.5)

## 2017-02-18 LAB — BASIC METABOLIC PANEL
Anion gap: 10 (ref 5–15)
BUN: 14 mg/dL (ref 6–20)
CHLORIDE: 103 mmol/L (ref 101–111)
CO2: 24 mmol/L (ref 22–32)
CREATININE: 0.96 mg/dL (ref 0.44–1.00)
Calcium: 8.1 mg/dL — ABNORMAL LOW (ref 8.9–10.3)
GFR calc Af Amer: >60 mL/min (ref >60)
Glucose, Bld: 113 mg/dL — ABNORMAL HIGH (ref 65–99)
POTASSIUM: 4.1 mmol/L (ref 3.5–5.1)
SODIUM: 137 mmol/L (ref 135–145)

## 2017-02-18 LAB — I-STAT TROPONIN, ED: TROPONIN I, POC: 0 ng/mL (ref 0.00–0.08)

## 2017-02-18 MED ORDER — ALBUTEROL (5 MG/ML) CONTINUOUS INHALATION SOLN
10.0000 mg/h | INHALATION_SOLUTION | Freq: Once | RESPIRATORY_TRACT | Status: AC
Start: 2017-02-18 — End: 2017-02-18
  Administered 2017-02-18: 10 mg/h via RESPIRATORY_TRACT
  Filled 2017-02-18: qty 20

## 2017-02-18 MED ORDER — PREDNISONE 20 MG PO TABS: ORAL_TABLET | ORAL | 0 refills | Status: DC

## 2017-02-18 MED ORDER — ALBUTEROL (5 MG/ML) CONTINUOUS INHALATION SOLN
10.0000 mg/h | INHALATION_SOLUTION | Freq: Once | RESPIRATORY_TRACT | Status: AC
  Administered 2017-02-18: 10 mg/h via RESPIRATORY_TRACT

## 2017-02-18 MED ORDER — ALBUTEROL SULFATE (2.5 MG/3ML) 0.083% IN NEBU
5.0000 mg | INHALATION_SOLUTION | Freq: Once | RESPIRATORY_TRACT | Status: AC
  Administered 2017-02-18: 5 mg via RESPIRATORY_TRACT

## 2017-02-18 MED ORDER — FLUCONAZOLE 150 MG PO TABS: ORAL_TABLET | ORAL | 0 refills | Status: DC

## 2017-02-18 NOTE — Discharge Instructions (Signed)
Prednisone 7 days as discussed. Diflucan as needed for symptoms of yeast infection as discussed. Return to ER with any worsening symptoms at home

## 2017-02-18 NOTE — ED Notes (Signed)
Patient walked around nurses station.  Lowest oxygen dropped was 93% and only stayed that low for a second.  Average oxygen saturation while patient walked around nurses station was 96% on RA.

## 2017-02-18 NOTE — ED Notes (Signed)
Nurse collected all blood except for lavender top,  I went to collect but pt refused.  Nurse notified

## 2017-02-18 NOTE — ED Triage Notes (Signed)
Per EMS, patient from home with chest tightness and SOB.  Hx of asthma and patient believes this feels like her asthma.  States home neb and inhaler has not been working and has been taking husbands steroids.  20G RH.  10 Albuterol, 0.5 of atrovent, and 125 solumedrol given en route.  Wheezing and rhonchi heard throughout.  141/96, 94 HR, 100% on NEB, 22RR.

## 2017-02-18 NOTE — ED Notes (Signed)
Respiratory coming to start continuous neb.

## 2017-02-18 NOTE — ED Provider Notes (Addendum)
[facility]-EMERGENCY DEPT Provider Note   CSN: [id number] Arrival date & time: 02/18/17  2003     History   Chief Complaint Chief Complaint  Patient presents with  . Shortness of Breath    HPI Kathleen Rodriguez is a 51 y.o. female. Chief complaint is shortness of breath  HPI:  5103 mouth history of reactive airways disease. Has had symptomatic flare for the last 48 hours. Using home nebulizer and MDI with minimal improvement. Has been admitted, has been in intensive care unit, and has been intubated before. She thinks "last year maybe".  No fever. No chest pain except pain with coughing. No peripheral edema. No history of heart disease or congestive heart failure.  Was given 25 mg nebulized albuterol, and 125 IV Solu-Medrol by paramedics prior to her arrival.  Past Medical History:  Diagnosis Date  . Arthritis    "knees, lower back; legs, ankles" (01/27/2016)  . Asthma    followed by Dr. Craige Cotta  . CHF (congestive heart failure) (HCC) 2016   "when I went into a coma"  . Cocaine abuse   . 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 Dyspnea   . GERD (gastroesophageal reflux disease)   . Hypertension    "doctor took me off RX in 2016" (01/27/2016)  . Influenza B April 2014   Complicated by multi-organ failure  . OSA on CPAP "since " 03/20/2013  . Pneumonia 2016  . Required emergent intubation    asthma exacerbation in 2016  . Tobacco abuse     Patient Active Problem List   Diagnosis Date Noted  . Osteoarthritis of left knee 09/01/2016  . 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 Knee pain, 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  . Status asthmaticus 06/24/2016  . Acute respiratory failure (HCC) 06/24/2016  . Acute asthma exacerbation 05/12/2016  . Acute respiratory failure with hypoxia (HCC) 05/12/2016  . Tobacco abuse   . Primary osteoarthritis of left knee 03/09/2016  . Asthma with status asthmaticus 01/27/2016  . Acute respiratory distress 01/27/2016  . Non compliance with medical treatment  08/19/2015  . Anemia, iron deficiency 08/12/2015  . Essential hypertension 07/25/2015  . Sinus tachycardia (HCC) 07/25/2015  . Acute respiratory failure with hypercapnia (HCC) 07/24/2015  . COPD (chronic obstructive pulmonary disease) (HCC) 12/28/2014  . Tracheobronchitis 12/28/2014  . Dysfunctional uterine bleeding 12/28/2014  . Anemia 12/28/2014  . Pleuritic chest pain 05/16/2014  . Morbid obesity (HCC) 05/16/2014  . Chronic cough 05/01/2014  . Upper airway cough syndrome 05/01/2014  . Vaginal candidiasis 04/09/2014  . GERD (gastroesophageal reflux disease) 04/08/2014  . Medically noncompliant 04/07/2014  . Asthma exacerbation 04/06/2014  . CAP (community acquired pneumonia) 04/06/2014  . Chest pain 04/01/2014  . Hypertension 09/24/2013  . OSA (obstructive sleep apnea) 03/20/2013  . Uncontrolled persistent asthma 02/05/2013    Past Surgical History:  Procedure Laterality Date  . BREAST SURGERY Right 1980   as a teenager , cyst was benign  . CESAREAN SECTION  2006  . LACERATION REPAIR Right ~ 1997   "tried to cut myself"  . 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 TUBAL LIGATION  2006    OB History    No data available       Home Medications    Prior to Admission medications   Medication Sig Start Date End Date Taking? Authorizing Provider  acetaminophen (TYLENOL) 500  MG tablet Take 1,000 mg by mouth every 6 (six) hours as needed for mild pain.   Yes Historical Provider, MD  albuterol (VENTOLIN HFA) 108 (90 Base) MCG/ACT inhaler Inhale 2 puffs into the lungs every 6 (six) hours as needed for wheezing. 07/27/16  Yes [name], MD  fluconazole (DIFLUCAN) 150 MG tablet 1 by mouth on day 3 of treatment. Then P 1 on day 7 of steroid treatment. 02/18/17   Rolland Porter, MD  predniSONE (DELTASONE) 20 MG tablet 1 by mouth twice a day 3 days,  then one by mouth daily 4 days. 02/18/17   Rolland Porter, MD    Family History Family History  Problem Relation Age of Onset  . Hypertension Mother   . HIV/AIDS Father     Social History Social History  Substance Use Topics  . Smoking status: Former Smoker    Packs/day: 0.25    Years: 20.00    Types: Cigarettes    Quit date: 01/31/2013  . Smokeless tobacco: Never Used  . Alcohol use No     Comment: 01/26/2006 "clean from drinking since 2007"     Allergies   Latex; Tomato; and Wool alcohol [lanolin]   Review of Systems Review of Systems  Constitutional: Negative for appetite change, chills, diaphoresis, fatigue and fever.  HENT: Negative for mouth sores, sore throat and trouble swallowing.   Eyes: Negative for visual disturbance.  Respiratory: Positive for cough, shortness of breath and wheezing. Negative for chest tightness.   Cardiovascular: Negative for chest pain.  Gastrointestinal: Negative for abdominal distention, abdominal pain, diarrhea, nausea and vomiting.  Endocrine: Negative for polydipsia, polyphagia and polyuria.  Genitourinary: Negative for dysuria, frequency and hematuria.  Musculoskeletal: Negative for gait problem.  Skin: Negative for color change, pallor and rash.  Neurological: Negative for dizziness, syncope, light-headedness and headaches.  Hematological: Does not bruise/bleed easily.  Psychiatric/Behavioral: Negative for behavioral problems and confusion.     Physical Exam Updated Vital Signs BP (!) 137/54   Pulse (!) 113   Resp 18   SpO2 97%   Physical Exam  Constitutional: She is oriented to person, place, and time. She appears well-developed and well-nourished. No distress.  51 year old black female in mild respiratory distress with tachypnea and dyspnea with conversation.  HENT:  Head: Normocephalic.  Eyes: Conjunctivae are normal. Pupils are equal, round, and reactive to light. No scleral icterus.  Neck: Normal range of motion. Neck  supple. No thyromegaly present.  Cardiovascular: Normal rate and regular rhythm.  Exam reveals no gallop and no friction rub.   No murmur heard. Pulmonary/Chest: No respiratory distress. She has no wheezes. She has no rales.  Increased work of breathing. Wheezing and prolongation in all fields. No peripheral edema.  Abdominal: Soft. Bowel sounds are normal. She exhibits no distension. There is no tenderness. There is no rebound.  Musculoskeletal: Normal range of motion.  Neurological: She is alert and oriented to person, place, and time.  Skin: Skin is warm and dry. No rash noted.  Psychiatric: She has a normal mood and affect. Her behavior is normal.     ED Treatments / Results  Labs (all labs ordered are listed, but only abnormal results are displayed) Labs Reviewed  CBC WITH DIFFERENTIAL/PLATELET - Abnormal; Notable for the following:       Result Value   Hemoglobin 11.3 (*)    MCH 25.3 (*)    RDW 18.4 (*)    All other components within normal limits  BASIC METABOLIC PANEL -  Abnormal; Notable for the following:    Glucose, Bld 113 (*)    Calcium 8.1 (*)    All other components within normal limits  I-STAT TROPOININ, ED    EKG  EKG Interpretation None       Radiology Dg Chest Port 1 View  Result Date: 02/18/2017 CLINICAL DATA:  Dyspnea and chest tightness. History of asthma. Wheezing and rhonchi. EXAM: PORTABLE CHEST 1 VIEW COMPARISON:  06/24/2016 FINDINGS: The heart size and mediastinal contours are within normal limits. Both lungs are clear. The visualized skeletal structures are unremarkable. IMPRESSION: No active disease. Electronically Signed   By: Tollie Eth M.D.   On: 02/18/2017 20:41    Procedures Procedures (including critical care time)  Medications Ordered in ED Medications  albuterol (PROVENTIL) (2.5 MG/3ML) 0.083% nebulizer solution 5 mg (5 mg Nebulization Given 02/18/17 2031)  albuterol (PROVENTIL,VENTOLIN) solution continuous neb (10 mg/hr  Nebulization Given 02/18/17 2030)  albuterol (PROVENTIL,VENTOLIN) solution continuous neb (10 mg/hr Nebulization Given 02/18/17 2201)     Initial Impression / Assessment and Plan / ED Course  I have reviewed the triage vital signs and the nursing notes.  Pertinent labs & imaging results that were available during my care of the patient were reviewed by me and considered in my medical decision making (see chart for details).    Historically, and clinically no congestive heart failure. No gallop. No JVD, no peripheral edema. Plan nebulized albuterol continuous, x-ray, labs, reevaluation.  Final Clinical Impressions(s) / ED Diagnoses   Final diagnoses:  Moderate asthma with exacerbation, unspecified whether persistent    Patient given a total of 1 hour 15 minutes of albuterol. Reexamined at 30 minutes, one hour, and 1 hour 15 minutes. She states she was "ready to go home. She hasn't minimal residual index of 20 wheezing. Worker breathing was much improved. I discussed with her inpatient treatment she politely declines. I have asked her to be off of her O2 for 20 minutes and appetite. She is ambulated does not desaturate below 93% remains asymptomatic. She has had multiple admissions in the past she states she feels as though she is 2. or she will do well at home. She has nebulizer. Will use prednisone home. I have asked her, and she agrees to return with any worsening of symptoms or recurrence at home.    CRITICAL CARE Performed by: Rolland Porter JOSEPH   Total critical care time: 86 minutes  Critical care time was exclusive of separately billable procedures and treating other patients.  Critical care was necessary to treat or prevent imminent or life-threatening deterioration.  Critical care was time spent personally by me on the following activities: development of treatment plan with patient and/or surrogate as well as nursing, discussions with consultants, evaluation of patient's  response to treatment, examination of patient, obtaining history from patient or surrogate, ordering and performing treatments and interventions, ordering and review of laboratory studies, ordering and review of radiographic studies, pulse oximetry and re-evaluation of patient's condition.   New Prescriptions New Prescriptions   FLUCONAZOLE (DIFLUCAN) 150 MG TABLET    1 by mouth on day 3 of treatment. Then P 1 on day 7 of steroid treatment.   PREDNISONE (DELTASONE) 20 MG TABLET    1 by mouth twice a day 3 days, then one by mouth daily 4 days.     Rolland Porter, MD 02/18/17 2310    Rolland Porter, MD 02/18/17 [phone]

## 2017-02-27 MED FILL — !VENTOLIN HFA INHALER: 108 (90 BAS | 25 days supply | Qty: 18 | Fill #1

## 2017-03-01 ENCOUNTER — Telehealth (INDEPENDENT_AMBULATORY_CARE_PROVIDER_SITE_OTHER): Payer: Self-pay | Admitting: Orthopaedic Surgery

## 2017-03-01 NOTE — Telephone Encounter (Signed)
PT REQUESTING A NOTE STATING THAT SHE HAS METAL IN HER KNEE BECAUSE SHE WANTS TO GO VISIT HER BROTHER WHOM IS IN JAIL.  PLEASE ADVISE AS PT IS LEAVING OUT Friday.  [phone]

## 2017-03-02 NOTE — Telephone Encounter (Signed)
That's fine

## 2017-03-02 NOTE — Telephone Encounter (Signed)
Please advise 

## 2017-03-03 ENCOUNTER — Ambulatory Visit (INDEPENDENT_AMBULATORY_CARE_PROVIDER_SITE_OTHER): Payer: Self-pay | Admitting: Pulmonary Disease

## 2017-03-03 ENCOUNTER — Encounter (INDEPENDENT_AMBULATORY_CARE_PROVIDER_SITE_OTHER): Payer: Self-pay

## 2017-03-03 ENCOUNTER — Encounter: Payer: Self-pay | Admitting: Pulmonary Disease

## 2017-03-03 VITALS — BP 112/82 | HR 82 | Resp 18 | Ht 65.0 in | Wt 271.8 lb

## 2017-03-03 DIAGNOSIS — G4733 Obstructive sleep apnea (adult) (pediatric): Secondary | ICD-10-CM

## 2017-03-03 DIAGNOSIS — Z9989 Dependence on other enabling machines and devices: Secondary | ICD-10-CM

## 2017-03-03 DIAGNOSIS — Z6841 Body Mass Index (BMI) 40.0 and over, adult: Secondary | ICD-10-CM

## 2017-03-03 DIAGNOSIS — J4551 Severe persistent asthma with (acute) exacerbation: Secondary | ICD-10-CM

## 2017-03-03 DIAGNOSIS — J45901 Unspecified asthma with (acute) exacerbation: Secondary | ICD-10-CM

## 2017-03-03 DIAGNOSIS — J45998 Other asthma: Secondary | ICD-10-CM

## 2017-03-03 LAB — PULMONARY FUNCTION TEST
DL/VA % PRED: 98 %
DL/VA: 4.89 ml/min/mmHg/L
DLCO UNC % PRED: 68 %
DLCO cor % pred: 67 %
DLCO cor: 17.74 ml/min/mmHg
DLCO unc: 18 ml/min/mmHg
FEF 25-75 Post: 0.95 L/sec
FEF 25-75 Pre: 0.67 L/sec
FEF2575-%CHANGE-POST: 42 %
FEF2575-%Pred-Post: 37 %
FEF2575-%Pred-Pre: 26 %
FEV1-%Change-Post: 14 %
FEV1-%PRED-PRE: 56 %
FEV1-%Pred-Post: 64 %
FEV1-POST: 1.57 L
FEV1-PRE: 1.38 L
FEV1FVC-%CHANGE-POST: 9 %
FEV1FVC-%Pred-Pre: 72 %
FEV6-%CHANGE-POST: 4 %
FEV6-%PRED-POST: 82 %
FEV6-%PRED-PRE: 79 %
FEV6-POST: 2.44 L
FEV6-PRE: 2.33 L
FEV6FVC-%Change-Post: 0 %
FEV6FVC-%PRED-POST: 102 %
FEV6FVC-%PRED-PRE: 101 %
FVC-%Change-Post: 4 %
FVC-%PRED-POST: 80 %
FVC-%Pred-Pre: 77 %
FVC-Post: 2.45 L
FVC-Pre: 2.36 L
POST FEV6/FVC RATIO: 99 %
PRE FEV1/FVC RATIO: 58 %
Post FEV1/FVC ratio: 64 %
Pre FEV6/FVC Ratio: 99 %
RV % PRED: 108 %
RV: 2.05 L
TLC % PRED: 84 %
TLC: 4.47 L

## 2017-03-03 MED ORDER — FLUTICASONE PROPIONATE HFA 44 MCG/ACT IN AERO: 2.0000 | INHALATION_SPRAY | Freq: Two times a day (BID) | RESPIRATORY_TRACT | 12 refills | Status: DC

## 2017-03-03 MED ORDER — AEROCHAMBER MV MISC: 0 refills | Status: DC

## 2017-03-03 MED ORDER — FLUTICASONE PROPIONATE 50 MCG/ACT NA SUSP: 2.0000 | Freq: Every day | NASAL | 2 refills | Status: DC

## 2017-03-03 NOTE — Patient Instructions (Signed)
Flovent 2 sprays twice per day, and rinse mouth after each use  Albuterol 2 puffs every four to six hours as needed for cough, wheeze, or chest congestion Will arrange for spacer device for inhalers Will get copy of CPAP report  Follow up in 6 weeks with Dr. Craige CottaSood or Nurse Practitioner

## 2017-03-03 NOTE — Progress Notes (Signed)
PFT done today. 

## 2017-03-03 NOTE — Telephone Encounter (Signed)
[facility]

## 2017-03-03 NOTE — Progress Notes (Signed)
Current Outpatient Prescriptions on File Prior to Visit  Medication Sig  . acetaminophen (TYLENOL) 500 MG tablet Take 1,000 mg by mouth every 6 (six) hours as needed for mild pain.  Marland Kitchen albuterol (VENTOLIN HFA) 108 (90 Base) MCG/ACT inhaler Inhale 2 puffs into the lungs every 6 (six) hours as needed for wheezing.  . fluconazole (DIFLUCAN) 150 MG tablet 1 by mouth on day 3 of treatment. Then P 1 on day 7 of steroid treatment.  . predniSONE (DELTASONE) 20 MG tablet 1 by mouth twice a day 3 days, then one by mouth daily 4 days. (Patient not taking: Reported on 03/03/2017)   Current Facility-Administered Medications on File Prior to Visit  Medication  . nystatin cream (MYCOSTATIN)     Chief Complaint  Patient presents with  . Asthma    Patient is having asthma exacerbation she states that when the wind blows her breathing gets worse . Patient states that maybe she needs to be put on prednisone.     Pulmonary tests CT chest 02/05/13 >> no PE, no acute findings Labs 02/11/13 >> ANA negative, Anti-GBM Ab < 1, ANCA negative PFT 05/15/13 >> FEV1 1.90 (76%), FEV1% 72, TLC 4.23 (81%), DLCO 89%, no BD PFT 03/03/17 >> FEV1 1.57 (64%), FEV1% 64, TLC 4.47 (84%), DLCO 685, + BD  Sleep tests PSG 04/19/13 >> AHI 16.4, SpO2 low 84%.  Cardiac tests Echo 04/06/14 >> mod LVH, EF 65 to 70%  Past medical history Cocaine abuse, CHF, GERD, HTN, Influenza  Past surgical history, Family history, Social history, Allergies all reviewed.  Vital Signs BP 112/82 (BP Location: Right Arm, Cuff Size: Normal)   Pulse 82   Resp 18   Ht 5\' 5"  (1.651 m)   Wt 271 lb 12.8 oz (123.3 kg)   SpO2 99%   BMI 45.23 kg/m   History of Present Illness Kathleen Rodriguez is a 51 y.o. female with former smoker with cough, UACS, asthma, OSA.  I last saw her in 2015.  She was in ER last week.  Just finished prednisone.  She is starting to get cough and congestion again.  Also having some wheeze.  Has post nasal drip.  Denies  reflux.  Using flonase daily.  Only has albuterol as inhaler for lungs.  Denies cigarette smoking.  Her husband smokes, but goes outside.  No fever, skin rash, or hemoptysis.  She reports using CPAP nightly without issue.  Physical Exam  General - No distress ENT - No sinus tenderness, no oral exudate, no LAN Cardiac - s1s2 regular, no murmur Chest - No wheeze/rales/dullness Back - No focal tenderness Abd - Soft, non-tender Ext - No edema Neuro - Normal strength Skin - No rashes Psych - normal mood, and behavior   Dg Chest Port 1 View  Result Date: 02/18/2017 CLINICAL DATA:  Dyspnea and chest tightness. History of asthma. Wheezing and rhonchi. EXAM: PORTABLE CHEST 1 VIEW COMPARISON:  06/24/2016 FINDINGS: The heart size and mediastinal contours are within normal limits. Both lungs are clear. The visualized skeletal structures are unremarkable. IMPRESSION: No active disease. Electronically Signed   By: Ashley Royalty M.D.   On: 02/18/2017 20:41    CMP Latest Ref Rng & Units 02/18/2017 09/02/2016 08/25/2016  Glucose 65 - 99 mg/dL 113(H) 109(H) 84  BUN 6 - 20 mg/dL 14 11 11   Creatinine 0.44 - 1.00 mg/dL 0.96 0.82 0.77  Sodium 135 - 145 mmol/L 137 137 139  Potassium 3.5 - 5.1 mmol/L 4.1 4.7 4.0  Chloride 101 - 111 mmol/L 103 105 108  CO2 22 - 32 mmol/L 24 25 25   Calcium 8.9 - 10.3 mg/dL 8.1(L) 8.5(L) 9.1  Total Protein 6.5 - 8.1 g/dL - - 6.9  Total Bilirubin 0.3 - 1.2 mg/dL - - 0.6  Alkaline Phos 38 - 126 U/L - - 51  AST 15 - 41 U/L - - 13(L)  ALT 14 - 54 U/L - - 11(L)    CBC Latest Ref Rng & Units 02/18/2017 09/03/2016 09/02/2016  WBC 4.0 - 10.5 K/uL 9.2 11.0(H) 10.1  Hemoglobin 12.0 - 15.0 g/dL 11.3(L) 11.2(L) 11.9(L)  Hematocrit 36.0 - 46.0 % 36.2 34.5(L) 37.2  Platelets 150 - 400 K/uL 342 237 276     Assessment/Plan  Persistent asthma with recent exacerbation - will start her on flovent - continue prn albuterol - will arrange for spacer device  Upper airway cough syndrome  with post nasal drip. - continue flonase and nasal irrigation  Obstructive sleep apnea. - will get copy of her download - she is compliant and reports benefit from therapy  Obesity. - discussed importance of weight loss   Patient Instructions  Flovent 2 sprays twice per day, and rinse mouth after each use  Albuterol 2 puffs every four to six hours as needed for cough, wheeze, or chest congestion Will arrange for spacer device for inhalers Will get copy of CPAP report  Follow up in 6 weeks with Dr. Halford Chessman or Nurse Practitioner    Chesley Mires, MD [facility] Pager:  [phone] 03/03/2017, 11:57 AM

## 2017-03-03 NOTE — Addendum Note (Signed)
Addended by: Maisie FusGREEN, Artie Takayama M on: 03/03/2017 04:11 PM   Modules accepted: Orders

## 2017-03-07 ENCOUNTER — Telehealth: Payer: Self-pay | Admitting: Pulmonary Disease

## 2017-03-07 NOTE — Telephone Encounter (Signed)
Noted  

## 2017-03-07 NOTE — Telephone Encounter (Signed)
Spoke with pt, who states she is currently having a asthma flare. Pt reports of increased sob, prod cough with clear mucus, wheezing & chest congestion x4d. Pt states she has not been able to go pick up flovent yet that was prescribed on 03/03/17, due to transportation. I attempted to ask pt how many times she was using her albuterol. pt stated I was asking her 1001 questions and she can't breathe and became upset and began rasing her voice. I attempted to explain to pt that it is office protocol to ask questions, so the provider can better understand pt's symptoms. Pt then stated to just forgot this call that she will just go to the ED because he will just call in prednisone and she will not be able to pick it up until Friday. Pt then hung up the phone.  Will route to VS as a FYI.

## 2017-03-17 MED FILL — !FLOVENT HFA 44 MCG INHALER: 44 MCG | 30 days supply | Qty: 1 | Fill #0

## 2017-03-17 MED FILL — FLUCONAZOLE 150 MG TABLET: 150 | 7 days supply | Qty: 2 | Fill #0

## 2017-03-18 ENCOUNTER — Emergency Department (HOSPITAL_COMMUNITY): Payer: Medicaid Other

## 2017-03-18 ENCOUNTER — Encounter (HOSPITAL_COMMUNITY): Payer: Self-pay

## 2017-03-18 ENCOUNTER — Inpatient Hospital Stay (HOSPITAL_COMMUNITY)
Admission: EM | Admit: 2017-03-18 | Discharge: 2017-03-20 | DRG: 202 | Disposition: A | Discharge status: Home / Self Care | Payer: Medicaid Other | Attending: Internal Medicine | Admitting: Internal Medicine

## 2017-03-18 DIAGNOSIS — R053 Chronic cough: Secondary | ICD-10-CM | POA: Diagnosis present

## 2017-03-18 DIAGNOSIS — Z91040 Latex allergy status: Secondary | ICD-10-CM

## 2017-03-18 DIAGNOSIS — M479 Spondylosis, unspecified: Secondary | ICD-10-CM | POA: Diagnosis present

## 2017-03-18 DIAGNOSIS — D509 Iron deficiency anemia, unspecified: Secondary | ICD-10-CM | POA: Diagnosis present

## 2017-03-18 DIAGNOSIS — H9202 Otalgia, left ear: Secondary | ICD-10-CM | POA: Diagnosis present

## 2017-03-18 DIAGNOSIS — R0603 Acute respiratory distress: Secondary | ICD-10-CM | POA: Diagnosis present

## 2017-03-18 DIAGNOSIS — Z96652 Presence of left artificial knee joint: Secondary | ICD-10-CM | POA: Diagnosis present

## 2017-03-18 DIAGNOSIS — R05 Cough: Secondary | ICD-10-CM | POA: Diagnosis present

## 2017-03-18 DIAGNOSIS — Z9989 Dependence on other enabling machines and devices: Secondary | ICD-10-CM

## 2017-03-18 DIAGNOSIS — Z96653 Presence of artificial knee joint, bilateral: Secondary | ICD-10-CM | POA: Diagnosis present

## 2017-03-18 DIAGNOSIS — Z87891 Personal history of nicotine dependence: Secondary | ICD-10-CM

## 2017-03-18 DIAGNOSIS — Z91018 Allergy to other foods: Secondary | ICD-10-CM

## 2017-03-18 DIAGNOSIS — J45901 Unspecified asthma with (acute) exacerbation: Secondary | ICD-10-CM

## 2017-03-18 DIAGNOSIS — J45998 Other asthma: Secondary | ICD-10-CM | POA: Diagnosis present

## 2017-03-18 DIAGNOSIS — R000 Tachycardia, unspecified: Secondary | ICD-10-CM | POA: Diagnosis present

## 2017-03-18 DIAGNOSIS — K219 Gastro-esophageal reflux disease without esophagitis: Secondary | ICD-10-CM | POA: Diagnosis present

## 2017-03-18 DIAGNOSIS — M19072 Primary osteoarthritis, left ankle and foot: Secondary | ICD-10-CM | POA: Diagnosis present

## 2017-03-18 DIAGNOSIS — I5032 Chronic diastolic (congestive) heart failure: Secondary | ICD-10-CM | POA: Diagnosis present

## 2017-03-18 DIAGNOSIS — Z79899 Other long term (current) drug therapy: Secondary | ICD-10-CM

## 2017-03-18 DIAGNOSIS — J4551 Severe persistent asthma with (acute) exacerbation: Principal | ICD-10-CM | POA: Diagnosis present

## 2017-03-18 DIAGNOSIS — G4733 Obstructive sleep apnea (adult) (pediatric): Secondary | ICD-10-CM | POA: Diagnosis present

## 2017-03-18 DIAGNOSIS — Z7952 Long term (current) use of systemic steroids: Secondary | ICD-10-CM

## 2017-03-18 DIAGNOSIS — I110 Hypertensive heart disease with heart failure: Secondary | ICD-10-CM | POA: Diagnosis present

## 2017-03-18 DIAGNOSIS — Z888 Allergy status to other drugs, medicaments and biological substances status: Secondary | ICD-10-CM

## 2017-03-18 DIAGNOSIS — I10 Essential (primary) hypertension: Secondary | ICD-10-CM | POA: Diagnosis present

## 2017-03-18 DIAGNOSIS — M19071 Primary osteoarthritis, right ankle and foot: Secondary | ICD-10-CM | POA: Diagnosis present

## 2017-03-18 DIAGNOSIS — T445X5A Adverse effect of predominantly beta-adrenoreceptor agonists, initial encounter: Secondary | ICD-10-CM | POA: Diagnosis present

## 2017-03-18 DIAGNOSIS — Z91048 Other nonmedicinal substance allergy status: Secondary | ICD-10-CM

## 2017-03-18 DIAGNOSIS — M170 Bilateral primary osteoarthritis of knee: Secondary | ICD-10-CM | POA: Diagnosis present

## 2017-03-18 DIAGNOSIS — Z6841 Body Mass Index (BMI) 40.0 and over, adult: Secondary | ICD-10-CM

## 2017-03-18 HISTORY — DX: Other specified cough: R05.8

## 2017-03-18 HISTORY — DX: Cough: R05

## 2017-03-18 LAB — BASIC METABOLIC PANEL
Anion gap: 7 (ref 5–15)
BUN: 11 mg/dL (ref 6–20)
CALCIUM: 8.4 mg/dL — AB (ref 8.9–10.3)
CHLORIDE: 108 mmol/L (ref 101–111)
CO2: 23 mmol/L (ref 22–32)
CREATININE: 0.83 mg/dL (ref 0.44–1.00)
GFR calc non Af Amer: >60 mL/min (ref >60)
Glucose, Bld: 142 mg/dL — ABNORMAL HIGH (ref 65–99)
Potassium: 3.6 mmol/L (ref 3.5–5.1)
SODIUM: 138 mmol/L (ref 135–145)

## 2017-03-18 LAB — CBC WITH DIFFERENTIAL/PLATELET
Basophils Absolute: 0 10*3/uL (ref 0.0–0.1)
Basophils Relative: 1 %
EOS ABS: 0.2 10*3/uL (ref 0.0–0.7)
Eosinophils Relative: 2 %
HCT: 37.3 % (ref 36.0–46.0)
HEMOGLOBIN: 11.4 g/dL — AB (ref 12.0–15.0)
LYMPHS ABS: 1.7 10*3/uL (ref 0.7–4.0)
Lymphocytes Relative: 20 %
MCH: 25.1 pg — AB (ref 26.0–34.0)
MCHC: 30.6 g/dL (ref 30.0–36.0)
MCV: 82 fL (ref 78.0–100.0)
MONOS PCT: 4 %
Monocytes Absolute: 0.3 10*3/uL (ref 0.1–1.0)
NEUTROS PCT: 73 %
Neutro Abs: 6.3 10*3/uL (ref 1.7–7.7)
PLATELETS: 319 10*3/uL (ref 150–400)
RBC: 4.55 MIL/uL (ref 3.87–5.11)
RDW: 18.7 % — ABNORMAL HIGH (ref 11.5–15.5)
WBC: 8.6 10*3/uL (ref 4.0–10.5)

## 2017-03-18 LAB — I-STAT TROPONIN, ED: TROPONIN I, POC: 0 ng/mL (ref 0.00–0.08)

## 2017-03-18 LAB — TROPONIN I

## 2017-03-18 MED ORDER — ALBUTEROL SULFATE (2.5 MG/3ML) 0.083% IN NEBU
2.5000 mg | INHALATION_SOLUTION | Freq: Four times a day (QID) | RESPIRATORY_TRACT | Status: DC
  Administered 2017-03-19: 2.5 mg via RESPIRATORY_TRACT
  Filled 2017-03-18: qty 3

## 2017-03-18 MED ORDER — MAGNESIUM SULFATE 2 GM/50ML IV SOLN
2.0000 g | Freq: Once | INTRAVENOUS | Status: AC
  Administered 2017-03-18: 2 g via INTRAVENOUS
  Filled 2017-03-18: qty 50

## 2017-03-18 MED ORDER — IPRATROPIUM BROMIDE 0.02 % IN SOLN
0.5000 mg | Freq: Once | RESPIRATORY_TRACT | Status: AC
  Administered 2017-03-18: 0.5 mg via RESPIRATORY_TRACT
  Filled 2017-03-18: qty 2.5

## 2017-03-18 MED ORDER — TIOTROPIUM BROMIDE MONOHYDRATE 18 MCG IN CAPS
18.0000 ug | ORAL_CAPSULE | Freq: Every day | RESPIRATORY_TRACT | Status: DC
  Administered 2017-03-18 – 2017-03-20 (×3): 18 ug via RESPIRATORY_TRACT
  Filled 2017-03-18: qty 5

## 2017-03-18 MED ORDER — FLUTICASONE PROPIONATE 50 MCG/ACT NA SUSP
2.0000 | Freq: Every day | NASAL | Status: DC
Start: 2017-03-18 — End: 2017-03-18

## 2017-03-18 MED ORDER — ALBUTEROL SULFATE (2.5 MG/3ML) 0.083% IN NEBU
2.5000 mg | INHALATION_SOLUTION | RESPIRATORY_TRACT | Status: DC
  Administered 2017-03-18 (×4): 2.5 mg via RESPIRATORY_TRACT
  Filled 2017-03-18 (×3): qty 3

## 2017-03-18 MED ORDER — HYDROCODONE-ACETAMINOPHEN 5-325 MG PO TABS
1.0000 | ORAL_TABLET | ORAL | Status: DC | PRN
  Administered 2017-03-18 – 2017-03-20 (×6): 2 via ORAL
  Filled 2017-03-18 (×6): qty 2

## 2017-03-18 MED ORDER — SODIUM CHLORIDE 0.9 % IV SOLN
INTRAVENOUS | Status: AC
  Administered 2017-03-18: 10:00:00 via INTRAVENOUS

## 2017-03-18 MED ORDER — METHYLPREDNISOLONE SODIUM SUCC 40 MG IJ SOLR
40.0000 mg | Freq: Four times a day (QID) | INTRAMUSCULAR | Status: DC
  Administered 2017-03-18 – 2017-03-20 (×8): 40 mg via INTRAVENOUS
  Filled 2017-03-18 (×8): qty 1

## 2017-03-18 MED ORDER — METHOCARBAMOL 500 MG PO TABS
500.0000 mg | ORAL_TABLET | Freq: Every day | ORAL | Status: DC | PRN
  Administered 2017-03-18: 500 mg via ORAL
  Filled 2017-03-18: qty 1

## 2017-03-18 MED ORDER — ACETAMINOPHEN 500 MG PO TABS: 1000.0000 mg | ORAL_TABLET | Freq: Four times a day (QID) | ORAL | Status: DC | PRN

## 2017-03-18 MED ORDER — GUAIFENESIN ER 600 MG PO TB12
600.0000 mg | ORAL_TABLET | Freq: Two times a day (BID) | ORAL | Status: DC
  Administered 2017-03-18 – 2017-03-19 (×3): 600 mg via ORAL
  Filled 2017-03-18 (×3): qty 1

## 2017-03-18 MED ORDER — METHYLPREDNISOLONE SODIUM SUCC 40 MG IJ SOLR: 40.0000 mg | Freq: Two times a day (BID) | INTRAMUSCULAR | Status: DC

## 2017-03-18 MED ORDER — LORAZEPAM 0.5 MG PO TABS
0.5000 mg | ORAL_TABLET | Freq: Two times a day (BID) | ORAL | Status: DC | PRN
  Administered 2017-03-18 – 2017-03-19 (×2): 0.5 mg via ORAL
  Filled 2017-03-18 (×2): qty 1

## 2017-03-18 MED ORDER — ALBUTEROL SULFATE (2.5 MG/3ML) 0.083% IN NEBU
5.0000 mg | INHALATION_SOLUTION | Freq: Once | RESPIRATORY_TRACT | Status: AC
  Administered 2017-03-18: 5 mg via RESPIRATORY_TRACT
  Filled 2017-03-18: qty 6

## 2017-03-18 MED ORDER — SODIUM CHLORIDE 0.9% FLUSH
3.0000 mL | Freq: Two times a day (BID) | INTRAVENOUS | Status: DC
  Administered 2017-03-18 – 2017-03-20 (×5): 3 mL via INTRAVENOUS

## 2017-03-18 MED ORDER — FLUTICASONE PROPIONATE HFA 44 MCG/ACT IN AERO: 2.0000 | INHALATION_SPRAY | Freq: Two times a day (BID) | RESPIRATORY_TRACT | Status: DC

## 2017-03-18 MED ORDER — BUDESONIDE 0.5 MG/2ML IN SUSP
0.5000 mg | Freq: Two times a day (BID) | RESPIRATORY_TRACT | Status: DC
  Administered 2017-03-18 – 2017-03-20 (×5): 0.5 mg via RESPIRATORY_TRACT
  Filled 2017-03-18 (×4): qty 2

## 2017-03-18 MED ORDER — ENOXAPARIN SODIUM 40 MG/0.4ML ~~LOC~~ SOLN
40.0000 mg | SUBCUTANEOUS | Status: DC
  Administered 2017-03-18: 40 mg via SUBCUTANEOUS
  Filled 2017-03-18: qty 0.4

## 2017-03-18 MED ORDER — ALBUTEROL SULFATE (2.5 MG/3ML) 0.083% IN NEBU
2.5000 mg | INHALATION_SOLUTION | RESPIRATORY_TRACT | Status: DC | PRN
  Filled 2017-03-18: qty 3

## 2017-03-18 MED ORDER — ONDANSETRON HCL 4 MG PO TABS: 4.0000 mg | ORAL_TABLET | Freq: Four times a day (QID) | ORAL | Status: DC | PRN

## 2017-03-18 MED ORDER — DOXYCYCLINE HYCLATE 100 MG PO TABS
100.0000 mg | ORAL_TABLET | Freq: Two times a day (BID) | ORAL | Status: DC
  Administered 2017-03-18 – 2017-03-19 (×3): 100 mg via ORAL
  Filled 2017-03-18 (×3): qty 1

## 2017-03-18 MED ORDER — ONDANSETRON HCL 4 MG/2ML IJ SOLN: 4.0000 mg | Freq: Four times a day (QID) | INTRAMUSCULAR | Status: DC | PRN

## 2017-03-18 MED ORDER — BISACODYL 5 MG PO TBEC: 5.0000 mg | DELAYED_RELEASE_TABLET | Freq: Every day | ORAL | Status: DC | PRN

## 2017-03-18 NOTE — ED Notes (Signed)
The pt is coughing no recent cold  Hx asthma

## 2017-03-18 NOTE — Progress Notes (Signed)
Pt is alert and oriented complained of spasms in legs gave Prn with complete Relief, IV steroid given for wheezing and tighten in chest, currently comfortable on C-Pap.

## 2017-03-18 NOTE — Progress Notes (Signed)
RT instructed pt on the use of flutter valve.  Pt able to demonstrate back good technique. 

## 2017-03-18 NOTE — ED Triage Notes (Signed)
Pt arrived via [facility] from home c/o SOB x2 days.  Pt took 2.5mg  Albuterol at home without relief.  Wheezing throughout.  EMS gave 15mg  Albuterol, 1.0mg  Atrovent, 125mg  Solumedrol. Non productive cough x2 days.

## 2017-03-18 NOTE — ED Notes (Signed)
Giving hhn

## 2017-03-18 NOTE — ED Notes (Signed)
Walked patient with pulse oxy patient didn't do well patient still short of breath oxygen level was 89 to 90 patient stated her chest still tight pt back in bed resting back on monitor

## 2017-03-18 NOTE — ED Notes (Signed)
The pt has  A nebulizer machine at home  She has been using albuterol only.

## 2017-03-18 NOTE — H&P (Signed)
History and Physical    Kathleen Rodriguez ZOX:[id number] DOB: 06-04-1966 DOA: 03/18/2017  PCP: [name], MD [facility] Patient coming from: home  Chief Complaint: sob  HPI: Kathleen Rodriguez is a 51 y.o. female with medical history significant for persistent asthma with exacerbations required intubation 2016, obstructive sleep apnea, hypertension, upper airway cough syndrome, chronic CHF presents to the emergency Department chief complaint worsening shortness of breath. Initial evaluation reveals respiratory distress likely related to asthma exacerbation.  Information is obtained from the patient. She reports almost 2 week history of gradually worsening shortness of breath. She is unsure of any specific trigger but does indicate that when the wind blows her breathing tends to get worse. She saw her pulmonologist 10 days ago and states she was doing "okay. Since then she's developed gradual worsening shortness of breath increased nonproductive cough chest pain. She states she is use her inhaler with little relief. She reports she called the office several days ago asking for some prednisone as she was not getting any better. She denies headache dizziness syncope or near-syncope. She denies fever chills abdominal pain nausea vomiting diarrhea. She states symptoms are quite similar to prior asthma flares when she has ended up on the ventilator.   ED Course: In the emergency department she is afebrile hemodynamically stable and not hypoxic. This provided her with 50 mg of albuterol 1 mg of Atrovent 125 mg of Solu-Medrol. In the emergency department she received another nebulizer treatment. At the time of admission she continues with mild increased work of breathing or audible wheezing quite diminished air movement  Review of Systems: As per HPI otherwise all other systems reviewed and are negative.   Ambulatory Status: She ambulates independentlyand falls  Past Medical  History:  Diagnosis Date  . Arthritis    "knees, lower back; legs, ankles" (01/27/2016)  . Asthma    followed by Dr. Craige Cotta  . CHF (congestive heart failure) (HCC) 2016   "when I went into a coma"  . Cocaine abuse   . 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 Dyspnea   . GERD (gastroesophageal reflux disease)   . Hypertension    "doctor took me off RX in 2016" (01/27/2016)  . Influenza B April 2014   Complicated by multi-organ failure  . OSA on CPAP "since " 03/20/2013  . Pneumonia 2016  . Required emergent intubation    asthma exacerbation in 2016  . Tobacco abuse   . Upper airway cough syndrome     Past Surgical History:  Procedure Laterality Date  . BREAST SURGERY Right 1980   as a teenager , cyst was benign  . CESAREAN SECTION  2006  . LACERATION REPAIR Right ~ 1997   "tried to cut myself"  . 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 TUBAL LIGATION  2006    Social History   Social History  . Marital status: Married    Spouse name: N/A  . Number of children: N/A  . Years of education: N/A   Occupational History  . Not on file.   Social History Main Topics  . Smoking status: Former Smoker    Packs/day: 0.25    Years: 20.00    Types: Cigarettes    Quit date: 01/31/2013  . Smokeless tobacco: Never Used  . Alcohol use No  Comment: 01/26/2006 "clean from drinking since 2007"  . Drug use: No     Comment: 01/27/2016 "clean from smoking crack since 2007"  . Sexual activity: Yes    Birth control/ protection: None, Surgical   Other Topics Concern  . Not on file   Social History Narrative  . No narrative on file    Allergies  Allergen Reactions  . Latex Itching and Rash  . Tomato Hives, Itching and Other (See Comments)    ALSO REACTS TO KETCHUP  . Wool Alcohol [Lanolin] Itching    Family History  Problem Relation Age of Onset  .  Hypertension Mother   . HIV/AIDS Father     Prior to Admission medications   Medication Sig Start Date End Date Taking? Authorizing Provider  acetaminophen (TYLENOL) 500 MG tablet Take 1,000 mg by mouth every 6 (six) hours as needed for mild pain.   Yes [provider]  albuterol (VENTOLIN HFA) 108 (90 Base) MCG/ACT inhaler Inhale 2 puffs into the lungs every 6 (six) hours as needed for wheezing. 07/27/16  Yes [name], MD  fluticasone (FLONASE) 50 MCG/ACT nasal spray Place 2 sprays into both nostrils daily. 03/03/17  Yes [name], MD  fluticasone (FLOVENT HFA) 44 MCG/ACT inhaler Inhale 2 puffs into the lungs 2 (two) times daily. 03/03/17  Yes [name], MD  methocarbamol (ROBAXIN) 500 MG tablet Take 500 mg by mouth daily as needed for muscle spasms.   Yes [provider]    Physical Exam: Vitals:   03/18/17 0715 03/18/17 0815 03/18/17 0845 03/18/17 0940  BP: 130/70 110/70 134/68   Pulse: (!) 101 (!) 106 (!) 108 (!) 103  Resp:  19 19 17   Temp:      TempSrc:      SpO2: 94% 93% 91% 94%  Weight:      Height:         General:  Appears Slightly anxious sitting up in bed somewhat uncomfortable Eyes:  PERRL, EOMI, normal lids, iris ENT:  grossly normal hearing, lips & tongue, mucous membranes of her mouth are moist and pink Neck:  no LAD, masses or thyromegaly Cardiovascular:  Tachycardic but regular, no m/r/g. No LE edema.  Respiratory:  Moderate increased work of breathing with conversation. Has difficulty completing a sentence. Breath sounds are quite diminished throughout all above wheezing I hear no crackles Abdomen:  soft, ntnd, obese positive bowel sounds no guarding or rebounding Skin:  no rash or induration seen on limited exam Musculoskeletal:  grossly normal tone BUE/BLE, good ROM, no bony abnormality Psychiatric:  grossly normal mood and affect, speech fluent and appropriate, AOx3 Neurologic:  CN 2-12 grossly intact, moves all extremities in  coordinated fashion, sensation intact  Labs on Admission: I have personally reviewed following labs and imaging studies  CBC:  Recent Labs Lab 03/18/17 0615  WBC 8.6  NEUTROABS 6.3  HGB 11.4*  HCT 37.3  MCV 82.0  PLT 319   Basic Metabolic Panel:  Recent Labs Lab 03/18/17 0615  NA 138  K 3.6  CL 108  CO2 23  GLUCOSE 142*  BUN 11  CREATININE 0.83  CALCIUM 8.4*   GFR: Estimated Creatinine Clearance: 105.6 mL/min (by C-G formula based on SCr of 0.83 mg/dL). Liver Function Tests: No results for input(s): AST, ALT, ALKPHOS, BILITOT, PROT, ALBUMIN in the last 168 hours. No results for input(s): LIPASE, AMYLASE in the last 168 hours. No results for input(s): AMMONIA in the last 168 hours. Coagulation Profile: No results  for input(s): INR, PROTIME in the last 168 hours. Cardiac Enzymes: No results for input(s): CKTOTAL, CKMB, CKMBINDEX, TROPONINI in the last 168 hours. BNP (last 3 results) No results for input(s): PROBNP in the last 8760 hours. HbA1C: No results for input(s): HGBA1C in the last 72 hours. CBG: No results for input(s): GLUCAP in the last 168 hours. Lipid Profile: No results for input(s): CHOL, HDL, LDLCALC, TRIG, CHOLHDL, LDLDIRECT in the last 72 hours. Thyroid Function Tests: No results for input(s): TSH, T4TOTAL, FREET4, T3FREE, THYROIDAB in the last 72 hours. Anemia Panel: No results for input(s): VITAMINB12, FOLATE, FERRITIN, TIBC, IRON, RETICCTPCT in the last 72 hours. Urine analysis:    Component Value Date/Time   COLORURINE AMBER (A) 08/25/2016 0940   APPEARANCEUR CLEAR 08/25/2016 0940   LABSPEC 1.037 (H) 08/25/2016 0940   PHURINE 6.0 08/25/2016 0940   GLUCOSEU NEGATIVE 08/25/2016 0940   HGBUR NEGATIVE 08/25/2016 0940   BILIRUBINUR NEGATIVE 08/25/2016 0940   BILIRUBINUR neg 05/26/2016 1008   KETONESUR NEGATIVE 08/25/2016 0940   PROTEINUR NEGATIVE 08/25/2016 0940   UROBILINOGEN 0.2 05/26/2016 1008   UROBILINOGEN 1.0 03/22/2014 0805    NITRITE NEGATIVE 08/25/2016 0940   LEUKOCYTESUR NEGATIVE 08/25/2016 0940    Creatinine Clearance: Estimated Creatinine Clearance: 105.6 mL/min (by C-G formula based on SCr of 0.83 mg/dL).  Sepsis Labs: @LABRCNTIP (procalcitonin:4,lacticidven:4) )No results found for this or any previous visit (from the past 240 hour(s)).   Radiological Exams on Admission: Dg Chest Portable 1 View  Result Date: 03/18/2017 CLINICAL DATA:  Shortness of breath for 2 days. History of pneumonia, CHF. EXAM: PORTABLE CHEST 1 VIEW COMPARISON:  Chest radiograph February 18, 2017 FINDINGS: Cardiomediastinal silhouette is normal. No pleural effusions or focal consolidations. Trachea projects midline and there is no pneumothorax. Soft tissue planes and included osseous structures are non-suspicious. IMPRESSION: Stable examination:  No acute cardiopulmonary process. Electronically Signed   By: Awilda Metro M.D.   On: 03/18/2017 05:11    EKG: Independently reviewed. Sinus tachycardia Lateral infarct, old No acute changes No significant change since last tracing   Assessment/Plan Principal Problem:   Acute respiratory distress Active Problems:   Uncontrolled persistent asthma   OSA (obstructive sleep apnea)   GERD (gastroesophageal reflux disease)   Chronic cough   Morbid obesity (HCC)   Essential hypertension   Sinus tachycardia (HCC)   Anemia, iron deficiency   Acute asthma exacerbation   #1. Acute respiratory distress related to uncontrolled persistent asthma with exacerbation. Patient with a history of same. In the emergency department 2 weeks ago with same. Increased oxygen demand. She is not on oxygen at home. Chest x-ray stable no acute cardiopulmonary process. Oxygen saturation level greater than 90% on 2 L at rest and drops to 90% with ambulation -Admit to telemetry -Scheduled nebulizers -Solu-Medrol -Flutter valve -Monitor oxygen saturation level -Wean oxygen as able  #2. Uncontrolled persistent  asthma/exacerbation. Chart review indicates she was evaluated by pulmonology 15 days ago. She was developing congestion at that time according to the note. He recommended starting Flovent and continuing albuterol. Was recommending spacer device -See #1 -Resume Flovent -Follow-up with pulmonology outpatient  #3. Sinus tachycardia. Likely related to nebulizers. EKG as noted above. Initial troponin negative -Monitor -Cycle troponin -Antitussives  4. Hypertension. Blood pressure high end of normal in the emergency department. Home medications no antihypertensives -When necessary hydralazine -Monitor  #5. Obstructive sleep apnea. Reports compliance with C Pap -Respiratory therapy consult for sleep apnea   DVT prophylaxis:  lovenox Code Status: full  Family Communication: none present  Disposition Plan: home  Consults called: none  Admission status: obs    Toya SmothersBLACK,KAREN M MD [facility]  If 7PM-7AM, please contact night-coverage [email or web address] Password [facility]  03/18/2017, 9:51 AM

## 2017-03-18 NOTE — ED Provider Notes (Signed)
[facility]-EMERGENCY DEPT Provider Note   CSN: [id number] Arrival date & time: 03/18/17  5366     History   Chief Complaint Chief Complaint  Patient presents with  . Shortness of Breath    HPI Kathleen Rodriguez is a 51 y.o. female.  The history is provided by the patient and medical records. No language interpreter was used.  Shortness of Breath    Kathleen Rodriguez is a 51 y.o. female  with a PMH of asthma, osa on CPAP who presents to the Emergency Department complaining of worsening shortness of breath x 2 days. Feels similar to her prior asthma flares. Associated with non-productive cough and chest tightness. No fevers/chills. She took her home nebulizer and daily inhalers with little relief. She was given 15 mg albuterol, 1mg  atrovent and 125 mg solumedrol by EMS prior to arrival. She notes mild relief after these medications but still does not feel as if she can breath easily. Denies chest pain, leg swelling, abdominal pain.    Past Medical History:  Diagnosis Date  . Arthritis    "knees, lower back; legs, ankles" (01/27/2016)  . Asthma    followed by Dr. Craige Cotta  . CHF (congestive heart failure) (HCC) 2016   "when I went into a coma"  . Cocaine abuse   . 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 Dyspnea   . GERD (gastroesophageal reflux disease)   . Hypertension    "doctor took me off RX in 2016" (01/27/2016)  . Influenza B April 2014   Complicated by multi-organ failure  . OSA on CPAP "since " 03/20/2013  . Pneumonia 2016  . Required emergent intubation    asthma exacerbation in 2016  . Tobacco abuse     Patient Active Problem List   Diagnosis Date Noted  . Osteoarthritis of left knee 09/01/2016  . 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 Knee pain, 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  . Status asthmaticus 06/24/2016  . Acute respiratory failure (HCC) 06/24/2016  . Acute asthma exacerbation 05/12/2016  . Acute respiratory failure with hypoxia (HCC) 05/12/2016  . Tobacco abuse   .  Primary osteoarthritis of left knee 03/09/2016  . Asthma with status asthmaticus 01/27/2016  . Acute respiratory distress 01/27/2016  . Non compliance with medical treatment 08/19/2015  . Anemia, iron deficiency 08/12/2015  . Essential hypertension 07/25/2015  . Sinus tachycardia (HCC) 07/25/2015  . Acute respiratory failure with hypercapnia (HCC) 07/24/2015  . COPD (chronic obstructive pulmonary disease) (HCC) 12/28/2014  . Tracheobronchitis 12/28/2014  . Dysfunctional uterine bleeding 12/28/2014  . Anemia 12/28/2014  . Pleuritic chest pain 05/16/2014  . Morbid obesity (HCC) 05/16/2014  . Chronic cough 05/01/2014  . Upper airway cough syndrome 05/01/2014  . Vaginal candidiasis 04/09/2014  . GERD (gastroesophageal reflux disease) 04/08/2014  . Medically noncompliant 04/07/2014  . Asthma exacerbation 04/06/2014  . CAP (community acquired pneumonia) 04/06/2014  . Chest pain 04/01/2014  . Hypertension 09/24/2013  . OSA (obstructive sleep apnea) 03/20/2013  . Uncontrolled persistent asthma 02/05/2013    Past Surgical History:  Procedure Laterality Date  . BREAST SURGERY Right 1980   as a teenager , cyst was benign  . CESAREAN SECTION  2006  . LACERATION REPAIR Right ~ 1997   "tried to cut myself"  . 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 TUBAL LIGATION  2006    OB History  No data available       Home Medications    Prior to Admission medications   Medication Sig Start Date End Date Taking? Authorizing Provider  acetaminophen (TYLENOL) 500 MG tablet Take 1,000 mg by mouth every 6 (six) hours as needed for mild pain.   Yes [provider]  albuterol (VENTOLIN HFA) 108 (90 Base) MCG/ACT inhaler Inhale 2 puffs into the lungs every 6 (six) hours as needed for wheezing. 07/27/16  Yes [name], MD  fluticasone (FLONASE) 50  MCG/ACT nasal spray Place 2 sprays into both nostrils daily. 03/03/17  Yes [name], MD  fluticasone (FLOVENT HFA) 44 MCG/ACT inhaler Inhale 2 puffs into the lungs 2 (two) times daily. 03/03/17  Yes [name], MD  methocarbamol (ROBAXIN) 500 MG tablet Take 500 mg by mouth daily as needed for muscle spasms.   Yes [provider]  fluconazole (DIFLUCAN) 150 MG tablet 1 by mouth on day 3 of treatment. Then P 1 on day 7 of steroid treatment. Patient not taking: Reported on 03/18/2017 02/18/17   Rolland Porter, MD  predniSONE (DELTASONE) 20 MG tablet 1 by mouth twice a day 3 days, then one by mouth daily 4 days. Patient not taking: Reported on 03/03/2017 02/18/17   Rolland Porter, MD  Spacer/Aero-Holding Chambers (AEROCHAMBER MV) inhaler Use as instructed Patient not taking: Reported on 03/18/2017 03/03/17   [name], MD    Family History Family History  Problem Relation Age of Onset  . Hypertension Mother   . HIV/AIDS Father     Social History Social History  Substance Use Topics  . Smoking status: Former Smoker    Packs/day: 0.25    Years: 20.00    Types: Cigarettes    Quit date: 01/31/2013  . Smokeless tobacco: Never Used  . Alcohol use No     Comment: 01/26/2006 "clean from drinking since 2007"     Allergies   Latex; Tomato; and Wool alcohol [lanolin]   Review of Systems Review of Systems  Respiratory: Positive for chest tightness and shortness of breath.   All other systems reviewed and are negative.    Physical Exam Updated Vital Signs BP 134/68   Pulse (!) 108   Temp 98.7 F (37.1 C) (Axillary)   Resp 19   Ht 5\' 5"  (1.651 m)   Wt 122.9 kg (271 lb)   SpO2 91%   BMI 45.10 kg/m   Physical Exam  Constitutional: She is oriented to person, place, and time. She appears well-developed and well-nourished. No distress.  HENT:  Head: Normocephalic and atraumatic.  Cardiovascular: Normal rate, regular rhythm and normal heart sounds.   No murmur  heard. Pulmonary/Chest: No respiratory distress. She has no rales.  Expiratory wheezing bilaterally. Increased effort in breathing.  Abdominal: Soft. She exhibits no distension. There is no tenderness.  Musculoskeletal: She exhibits no edema.  Neurological: She is alert and oriented to person, place, and time.  Skin: Skin is warm and dry.  Nursing note and vitals reviewed.    ED Treatments / Results  Labs (all labs ordered are listed, but only abnormal results are displayed) Labs Reviewed  BASIC METABOLIC PANEL - Abnormal; Notable for the following:       Result Value   Glucose, Bld 142 (*)    Calcium 8.4 (*)    All other components within normal limits  CBC WITH DIFFERENTIAL/PLATELET - Abnormal; Notable for the following:    Hemoglobin 11.4 (*)    MCH 25.1 (*)  RDW 18.7 (*)    All other components within normal limits  I-STAT TROPOININ, ED    EKG  EKG Interpretation  Date/Time:  Saturday Mar 18 2017 04:42:10 EDT Ventricular Rate:  107 PR Interval:    QRS Duration: 109 QT Interval:  360 QTC Calculation: 481 R Axis:   68 Text Interpretation:  Sinus tachycardia Lateral infarct, old No acute changes No significant change since last tracing Confirmed by Derwood Kaplananavati, Ankit [phone]([id number]) on 03/18/2017 6:28:38 AM       Radiology Dg Chest Portable 1 View  Result Date: 03/18/2017 CLINICAL DATA:  Shortness of breath for 2 days. History of pneumonia, CHF. EXAM: PORTABLE CHEST 1 VIEW COMPARISON:  Chest radiograph February 18, 2017 FINDINGS: Cardiomediastinal silhouette is normal. No pleural effusions or focal consolidations. Trachea projects midline and there is no pneumothorax. Soft tissue planes and included osseous structures are non-suspicious. IMPRESSION: Stable examination:  No acute cardiopulmonary process. Electronically Signed   By: Awilda Metroourtnay  Bloomer M.D.   On: 03/18/2017 05:11    Procedures Procedures (including critical care time)  Medications Ordered in ED Medications   albuterol (PROVENTIL) (2.5 MG/3ML) 0.083% nebulizer solution 5 mg (5 mg Nebulization Given 03/18/17 0603)  ipratropium (ATROVENT) nebulizer solution 0.5 mg (0.5 mg Nebulization Given 03/18/17 0604)  magnesium sulfate IVPB 2 g 50 mL (0 g Intravenous Stopped 03/18/17 0903)     Initial Impression / Assessment and Plan / ED Course  I have reviewed the triage vital signs and the nursing notes.  Pertinent labs & imaging results that were available during my care of the patient were reviewed by me and considered in my medical decision making (see chart for details).    Kathleen Rodriguez is a 51 y.o. female who presents to ED for wheezing and shortness of breath. Hx of asthma and this feels like typical flare, however more severe. Was given 15 mg albuterol, 1 atrovent and 125 mg solumedrol prior to arrival. Duoneb given here with minimal improvement. Mag given. Patient ambulated and O2 dropped from 97% to 90% however nursing staff noted that she had to stop multiple times to catch her breath and did not tolerate ambulation well. Discussed case with hospitalist service who will admit.    Final Clinical Impressions(s) / ED Diagnoses   Final diagnoses:  Exacerbation of persistent asthma, unspecified asthma severity    New Prescriptions New Prescriptions   No medications on file     Merrilyn Legler, Chase PicketJaime Pilcher, PA-C 03/18/17 [id number]    Derwood KaplanNanavati, Ankit, MD 03/21/17 2230

## 2017-03-18 NOTE — Progress Notes (Signed)
Patient placed on CPAP for the night without complication. RT will continue to monitor. 

## 2017-03-19 DIAGNOSIS — G4733 Obstructive sleep apnea (adult) (pediatric): Secondary | ICD-10-CM

## 2017-03-19 DIAGNOSIS — M19072 Primary osteoarthritis, left ankle and foot: Secondary | ICD-10-CM | POA: Diagnosis present

## 2017-03-19 DIAGNOSIS — I5032 Chronic diastolic (congestive) heart failure: Secondary | ICD-10-CM | POA: Diagnosis present

## 2017-03-19 DIAGNOSIS — Z6841 Body Mass Index (BMI) 40.0 and over, adult: Secondary | ICD-10-CM | POA: Diagnosis not present

## 2017-03-19 DIAGNOSIS — M479 Spondylosis, unspecified: Secondary | ICD-10-CM | POA: Diagnosis present

## 2017-03-19 DIAGNOSIS — J4551 Severe persistent asthma with (acute) exacerbation: Secondary | ICD-10-CM | POA: Diagnosis not present

## 2017-03-19 DIAGNOSIS — T445X5A Adverse effect of predominantly beta-adrenoreceptor agonists, initial encounter: Secondary | ICD-10-CM | POA: Diagnosis present

## 2017-03-19 DIAGNOSIS — D509 Iron deficiency anemia, unspecified: Secondary | ICD-10-CM | POA: Diagnosis present

## 2017-03-19 DIAGNOSIS — R000 Tachycardia, unspecified: Secondary | ICD-10-CM | POA: Diagnosis present

## 2017-03-19 DIAGNOSIS — Z96652 Presence of left artificial knee joint: Secondary | ICD-10-CM | POA: Diagnosis present

## 2017-03-19 DIAGNOSIS — Z87891 Personal history of nicotine dependence: Secondary | ICD-10-CM | POA: Diagnosis not present

## 2017-03-19 DIAGNOSIS — I10 Essential (primary) hypertension: Secondary | ICD-10-CM

## 2017-03-19 DIAGNOSIS — Z91040 Latex allergy status: Secondary | ICD-10-CM | POA: Diagnosis not present

## 2017-03-19 DIAGNOSIS — M19071 Primary osteoarthritis, right ankle and foot: Secondary | ICD-10-CM | POA: Diagnosis present

## 2017-03-19 DIAGNOSIS — J45998 Other asthma: Secondary | ICD-10-CM

## 2017-03-19 DIAGNOSIS — Z9989 Dependence on other enabling machines and devices: Secondary | ICD-10-CM | POA: Diagnosis not present

## 2017-03-19 DIAGNOSIS — J45901 Unspecified asthma with (acute) exacerbation: Secondary | ICD-10-CM

## 2017-03-19 DIAGNOSIS — K219 Gastro-esophageal reflux disease without esophagitis: Secondary | ICD-10-CM | POA: Diagnosis present

## 2017-03-19 DIAGNOSIS — Z7952 Long term (current) use of systemic steroids: Secondary | ICD-10-CM | POA: Diagnosis not present

## 2017-03-19 DIAGNOSIS — R0603 Acute respiratory distress: Secondary | ICD-10-CM

## 2017-03-19 DIAGNOSIS — Z888 Allergy status to other drugs, medicaments and biological substances status: Secondary | ICD-10-CM | POA: Diagnosis not present

## 2017-03-19 DIAGNOSIS — R05 Cough: Secondary | ICD-10-CM

## 2017-03-19 DIAGNOSIS — Z91048 Other nonmedicinal substance allergy status: Secondary | ICD-10-CM | POA: Diagnosis not present

## 2017-03-19 DIAGNOSIS — H9202 Otalgia, left ear: Secondary | ICD-10-CM | POA: Diagnosis present

## 2017-03-19 DIAGNOSIS — Z91018 Allergy to other foods: Secondary | ICD-10-CM | POA: Diagnosis not present

## 2017-03-19 DIAGNOSIS — Z96653 Presence of artificial knee joint, bilateral: Secondary | ICD-10-CM | POA: Diagnosis present

## 2017-03-19 DIAGNOSIS — M170 Bilateral primary osteoarthritis of knee: Secondary | ICD-10-CM | POA: Diagnosis present

## 2017-03-19 DIAGNOSIS — Z79899 Other long term (current) drug therapy: Secondary | ICD-10-CM | POA: Diagnosis not present

## 2017-03-19 LAB — CBC
HCT: 35.7 % — ABNORMAL LOW (ref 36.0–46.0)
Hemoglobin: 11.3 g/dL — ABNORMAL LOW (ref 12.0–15.0)
MCH: 25.6 pg — AB (ref 26.0–34.0)
MCHC: 31.7 g/dL (ref 30.0–36.0)
MCV: 80.8 fL (ref 78.0–100.0)
PLATELETS: 333 10*3/uL (ref 150–400)
RBC: 4.42 MIL/uL (ref 3.87–5.11)
RDW: 18.6 % — ABNORMAL HIGH (ref 11.5–15.5)
WBC: 14 10*3/uL — ABNORMAL HIGH (ref 4.0–10.5)

## 2017-03-19 LAB — URINE CULTURE: CULTURE: NO GROWTH

## 2017-03-19 MED ORDER — AZITHROMYCIN 250 MG PO TABS
250.0000 mg | ORAL_TABLET | Freq: Every day | ORAL | Status: DC
  Administered 2017-03-20: 250 mg via ORAL
  Filled 2017-03-19: qty 1

## 2017-03-19 MED ORDER — PANTOPRAZOLE SODIUM 40 MG PO TBEC
40.0000 mg | DELAYED_RELEASE_TABLET | Freq: Every day | ORAL | Status: DC
  Administered 2017-03-20 (×2): 40 mg via ORAL
  Filled 2017-03-19 (×2): qty 1

## 2017-03-19 MED ORDER — ALBUTEROL SULFATE (2.5 MG/3ML) 0.083% IN NEBU
2.5000 mg | INHALATION_SOLUTION | Freq: Four times a day (QID) | RESPIRATORY_TRACT | Status: DC
  Administered 2017-03-20: 2.5 mg via RESPIRATORY_TRACT
  Filled 2017-03-19: qty 3

## 2017-03-19 MED ORDER — AZITHROMYCIN 500 MG PO TABS
500.0000 mg | ORAL_TABLET | Freq: Every day | ORAL | Status: AC
  Administered 2017-03-19: 500 mg via ORAL
  Filled 2017-03-19: qty 1

## 2017-03-19 MED ORDER — FLUTICASONE PROPIONATE 50 MCG/ACT NA SUSP
2.0000 | Freq: Every day | NASAL | Status: DC
  Administered 2017-03-19 – 2017-03-20 (×2): 2 via NASAL
  Filled 2017-03-19: qty 16

## 2017-03-19 MED ORDER — POTASSIUM CHLORIDE CRYS ER 20 MEQ PO TBCR
40.0000 meq | EXTENDED_RELEASE_TABLET | Freq: Four times a day (QID) | ORAL | Status: AC
  Administered 2017-03-19 (×2): 40 meq via ORAL
  Filled 2017-03-19 (×2): qty 2

## 2017-03-19 MED ORDER — GUAIFENESIN ER 600 MG PO TB12
1200.0000 mg | ORAL_TABLET | Freq: Two times a day (BID) | ORAL | Status: DC
  Administered 2017-03-19 – 2017-03-20 (×2): 1200 mg via ORAL
  Filled 2017-03-19 (×2): qty 2

## 2017-03-19 MED ORDER — ACETIC ACID-ALUMINUM ACETATE 2 % OT SOLN
4.0000 [drp] | Freq: Four times a day (QID) | OTIC | Status: DC
  Filled 2017-03-19: qty 60

## 2017-03-19 MED ORDER — ALBUTEROL SULFATE (2.5 MG/3ML) 0.083% IN NEBU
2.5000 mg | INHALATION_SOLUTION | RESPIRATORY_TRACT | Status: DC | PRN
  Administered 2017-03-19: 2.5 mg via RESPIRATORY_TRACT
  Filled 2017-03-19: qty 3

## 2017-03-19 MED ORDER — FUROSEMIDE 10 MG/ML IJ SOLN
40.0000 mg | Freq: Three times a day (TID) | INTRAMUSCULAR | Status: AC
  Administered 2017-03-19 (×2): 40 mg via INTRAVENOUS
  Filled 2017-03-19 (×2): qty 4

## 2017-03-19 MED ORDER — KETOROLAC TROMETHAMINE 30 MG/ML IJ SOLN
30.0000 mg | Freq: Once | INTRAMUSCULAR | Status: AC
  Administered 2017-03-20: 30 mg via INTRAVENOUS
  Filled 2017-03-19: qty 1

## 2017-03-19 NOTE — Progress Notes (Signed)
PROGRESS NOTE  Kathleen Rodriguez  ZOX:[id number] DOB: 06/24/1966 DOA: 03/18/2017 PCP: [name], MD Outpatient Specialists:  Subjective: Patient is still very short of breath, cannot lay flat in the bed. On room air sats in the 90s but desaturates and gets very severe exertional dyspnea.  Brief Narrative:  Kathleen Rodriguez is a 51 y.o. female with medical history significant for persistent asthma with exacerbations required intubation 2016, obstructive sleep apnea, hypertension, upper airway cough syndrome, chronic CHF presents to the emergency Department chief complaint worsening shortness of breath. Initial evaluation reveals respiratory distress likely related to asthma exacerbation.  She reports almost 2 week history of gradually worsening shortness of breath. She saw her pulmonologist 10 days ago and states she was doing "OK". She received a prescription for prednisone but she continues to have gradual worsening SOB, increased nonproductive cough and chest pain.   Assessment & Plan:   Principal Problem:   Acute respiratory distress Active Problems:   Uncontrolled persistent asthma   OSA (obstructive sleep apnea)   GERD (gastroesophageal reflux disease)   Chronic cough   Morbid obesity (HCC)   Essential hypertension   Sinus tachycardia (HCC)   Anemia, iron deficiency   Acute asthma exacerbation   Acute respiratory distress related to uncontrolled persistent asthma with exacerbation.  Patient with a history of same. In the emergency department 2 weeks ago with same. Increased oxygen demand. She is not on oxygen at home. Chest x-ray stable no acute cardiopulmonary process. Oxygen saturation level greater than 90% on 2 L at rest and drops to 90% with ambulation -Wheezing improved but is still there, continue Solu-Medrol. -Intake/output is positive, give dose of Lasix to keep negative fluid balance.  Uncontrolled persistent asthma/exacerbation. Chart review indicates she was  evaluated by pulmonology 15 days ago. She was developing congestion at that time according to the note. He recommended starting Flovent and continuing albuterol. Was recommending spacer device -See #1 -Resume Flovent -Follow-up with pulmonology outpatient  Sinus tachycardia. Likely related to nebulizers. EKG as noted above. Initial troponin negative -Monitor -Cycle troponin -Antitussives  Hypertension. Blood pressure high end of normal in the emergency department. Home medications no antihypertensives -When necessary hydralazine -Monitor  Obstructive sleep apnea. Reports compliance with C Pap -Respiratory therapy consult for sleep apnea   DVT prophylaxis:  Code Status: Full Code Family Communication:  Disposition Plan:  Diet: Diet heart healthy/carb modified Room service appropriate? Yes; Fluid consistency: Thin  Consultants:   None  Procedures:   None  Antimicrobials:   None   Objective: Vitals:   03/18/17 2016 03/18/17 2153 03/18/17 2353 03/19/17 0356  BP: (!) 150/72  (!) 134/58 (!) 153/80  Pulse: (!) 104  (!) 110 92  Resp: 20  20 20   Temp: 97.4 F (36.3 C)  97.5 F (36.4 C) 97.7 F (36.5 C)  TempSrc: Oral  Axillary Axillary  SpO2: 97% 94% 96% 95%  Weight:    122.5 kg (270 lb)  Height:        Intake/Output Summary (Last 24 hours) at 03/19/17 1235 Last data filed at 03/19/17 0946  Gross per 24 hour  Intake           1447.5 ml  Output             1000 ml  Net            447.5 ml   Filed Weights   03/18/17 0440 03/18/17 1018 03/19/17 0356  Weight: 122.9 kg (271 lb) 121.8 kg (268  lb 8 oz) 122.5 kg (270 lb)    Examination: General exam: Appears calm and comfortable  Respiratory system: Clear to auscultation. Respiratory effort normal. Cardiovascular system: S1 & S2 heard, RRR. No JVD, murmurs, rubs, gallops or clicks. No pedal edema. Gastrointestinal system: Abdomen is nondistended, soft and nontender. No organomegaly or masses felt. Normal bowel  sounds heard. Central nervous system: Alert and oriented. No focal neurological deficits. Extremities: Symmetric 5 x 5 power. Skin: No rashes, lesions or ulcers Psychiatry: Judgement and insight appear normal. Mood & affect appropriate.   Data Reviewed: I have personally reviewed following labs and imaging studies  CBC:  Recent Labs Lab 03/18/17 0615 03/19/17 0521  WBC 8.6 14.0*  NEUTROABS 6.3  --   HGB 11.4* 11.3*  HCT 37.3 35.7*  MCV 82.0 80.8  PLT 319 333   Basic Metabolic Panel:  Recent Labs Lab 03/18/17 0615  NA 138  K 3.6  CL 108  CO2 23  GLUCOSE 142*  BUN 11  CREATININE 0.83  CALCIUM 8.4*   GFR: Estimated Creatinine Clearance: 105.3 mL/min (by C-G formula based on SCr of 0.83 mg/dL). Liver Function Tests: No results for input(s): AST, ALT, ALKPHOS, BILITOT, PROT, ALBUMIN in the last 168 hours. No results for input(s): LIPASE, AMYLASE in the last 168 hours. No results for input(s): AMMONIA in the last 168 hours. Coagulation Profile: No results for input(s): INR, PROTIME in the last 168 hours. Cardiac Enzymes:  Recent Labs Lab 03/18/17 1832  TROPONINI <0.03   BNP (last 3 results) No results for input(s): PROBNP in the last 8760 hours. HbA1C: No results for input(s): HGBA1C in the last 72 hours. CBG: No results for input(s): GLUCAP in the last 168 hours. Lipid Profile: No results for input(s): CHOL, HDL, LDLCALC, TRIG, CHOLHDL, LDLDIRECT in the last 72 hours. Thyroid Function Tests: No results for input(s): TSH, T4TOTAL, FREET4, T3FREE, THYROIDAB in the last 72 hours. Anemia Panel: No results for input(s): VITAMINB12, FOLATE, FERRITIN, TIBC, IRON, RETICCTPCT in the last 72 hours. Urine analysis:    Component Value Date/Time   COLORURINE AMBER (A) 08/25/2016 0940   APPEARANCEUR CLEAR 08/25/2016 0940   LABSPEC 1.037 (H) 08/25/2016 0940   PHURINE 6.0 08/25/2016 0940   GLUCOSEU NEGATIVE 08/25/2016 0940   HGBUR NEGATIVE 08/25/2016 0940    BILIRUBINUR NEGATIVE 08/25/2016 0940   BILIRUBINUR neg 05/26/2016 1008   KETONESUR NEGATIVE 08/25/2016 0940   PROTEINUR NEGATIVE 08/25/2016 0940   UROBILINOGEN 0.2 05/26/2016 1008   UROBILINOGEN 1.0 03/22/2014 0805   NITRITE NEGATIVE 08/25/2016 0940   LEUKOCYTESUR NEGATIVE 08/25/2016 0940   Sepsis Labs: @LABRCNTIP (procalcitonin:4,lacticidven:4)  ) Recent Results (from the past 240 hour(s))  Culture, Urine     Status: None   Collection Time: 03/18/17 12:06 PM  Result Value Ref Range Status   Specimen Description URINE, CLEAN CATCH  Final   Special Requests NONE  Final   Culture NO GROWTH  Final   Report Status 03/19/2017 FINAL  Final     Invalid input(s): PROCALCITONIN, LACTICACIDVEN   Radiology Studies: Dg Chest Portable 1 View  Result Date: 03/18/2017 CLINICAL DATA:  Shortness of breath for 2 days. History of pneumonia, CHF. EXAM: PORTABLE CHEST 1 VIEW COMPARISON:  Chest radiograph February 18, 2017 FINDINGS: Cardiomediastinal silhouette is normal. No pleural effusions or focal consolidations. Trachea projects midline and there is no pneumothorax. Soft tissue planes and included osseous structures are non-suspicious. IMPRESSION: Stable examination:  No acute cardiopulmonary process. Electronically Signed   By: Michel Santee.D.  On: 03/18/2017 05:11        Scheduled Meds: . budesonide (PULMICORT) nebulizer solution  0.5 mg Nebulization BID  . doxycycline  100 mg Oral Q12H  . enoxaparin (LOVENOX) injection  40 mg Subcutaneous Q24H  . guaiFENesin  600 mg Oral BID  . methylPREDNISolone (SOLU-MEDROL) injection  40 mg Intravenous Q6H  . sodium chloride flush  3 mL Intravenous Q12H  . tiotropium  18 mcg Inhalation Daily   Continuous Infusions:   LOS: 0 days    Time spent: 35 minutes    Bryer Cozzolino A, MD [facility] Pager [phone]  If 7PM-7AM, please contact night-coverage [email or web address] Password [email or web address] 03/19/2017, 12:35 PM

## 2017-03-19 NOTE — Progress Notes (Signed)
Patient stated that she would place her CPAP on this night. RT will continue to monitor as needed.

## 2017-03-19 NOTE — Progress Notes (Signed)
Pt c/o ear ache. States pain is similar to that of previous ear infections. Requesting MD to come look at her ear. On call MD, [facility]rtiz[facility] text page. RN will administer PRN pain med for pain and continue to monitor pt. Thank you. Clydie BraunKaren

## 2017-03-19 NOTE — Progress Notes (Signed)
Patient ID: Kathleen Rodriguez, female   DOB: 1965-11-04, 51 y.o.   MRN: [id number] Patient requested left ear to be evaluated due to earache. Right ear canal does not show any acute abnormalities, however left ear is showing canal erythema. She has been on solumedrol and azithromycin. [place] OTIC and ketorolac 30 mg IVP x 1 dose ordered. Throat: Clear. Lungs: Mild wheezing bilaterally. Heart: S1S2, RRR.   Sanda Kleinavid Suresh Audi, MD

## 2017-03-20 DIAGNOSIS — K219 Gastro-esophageal reflux disease without esophagitis: Secondary | ICD-10-CM

## 2017-03-20 LAB — BASIC METABOLIC PANEL WITH GFR
Anion gap: 7 (ref 5–15)
BUN: 18 mg/dL (ref 6–20)
CO2: 26 mmol/L (ref 22–32)
Calcium: 9.1 mg/dL (ref 8.9–10.3)
Chloride: 105 mmol/L (ref 101–111)
Creatinine, Ser: 0.92 mg/dL (ref 0.44–1.00)
GFR calc Af Amer: >60 mL/min
GFR calc non Af Amer: >60 mL/min
Glucose, Bld: 115 mg/dL — ABNORMAL HIGH (ref 65–99)
Potassium: 4.3 mmol/L (ref 3.5–5.1)
Sodium: 138 mmol/L (ref 135–145)

## 2017-03-20 MED ORDER — GUAIFENESIN ER 600 MG PO TB12: 1200.0000 mg | ORAL_TABLET | Freq: Two times a day (BID) | ORAL | 0 refills | Status: DC

## 2017-03-20 MED ORDER — ACETIC ACID 2 % OT SOLN
4.0000 [drp] | Freq: Four times a day (QID) | OTIC | Status: DC
  Administered 2017-03-20: 4 [drp] via OTIC
  Filled 2017-03-20 (×36): qty 15

## 2017-03-20 MED ORDER — LEVOFLOXACIN 750 MG PO TABS: 750.0000 mg | ORAL_TABLET | Freq: Every day | ORAL | 0 refills | Status: DC

## 2017-03-20 MED ORDER — PREDNISONE 10 MG (21) PO TBPK: ORAL_TABLET | ORAL | 0 refills | Status: DC

## 2017-03-20 MED ORDER — ACETIC ACID 2 % OT SOLN: 4.0000 [drp] | Freq: Four times a day (QID) | OTIC | 0 refills | Status: DC

## 2017-03-20 MED ORDER — FLUCONAZOLE 150 MG PO TABS
150.0000 mg | ORAL_TABLET | Freq: Every day | ORAL | Status: DC
  Administered 2017-03-20: 150 mg via ORAL
  Filled 2017-03-20: qty 1

## 2017-03-20 MED ORDER — FUROSEMIDE 10 MG/ML IJ SOLN
40.0000 mg | Freq: Once | INTRAMUSCULAR | Status: DC
  Filled 2017-03-20: qty 4

## 2017-03-20 MED ORDER — FUROSEMIDE 40 MG PO TABS
40.0000 mg | ORAL_TABLET | Freq: Once | ORAL | Status: AC
  Administered 2017-03-20: 40 mg via ORAL
  Filled 2017-03-20: qty 1

## 2017-03-20 MED ORDER — ACETIC ACID 2 % OT SOLN
4.0000 [drp] | Freq: Four times a day (QID) | OTIC | Status: DC
  Filled 2017-03-20 (×36): qty 1

## 2017-03-20 NOTE — Progress Notes (Signed)
Pt is upset and angry because the ear gtts ordered are not available from our pharmacy.  RN called pharmacy about pt's ear gtts.  RN tried to get medication switched to a different ear gtt that we do have.  MD would have to change order.  Pharmacy told RN that the gtt have been ordered from [facility] and they will call RN when they arrive.  Pt updated.

## 2017-03-20 NOTE — Discharge Summary (Signed)
Physician Discharge Summary  WILLO YOON Rodriguez:[id number] DOB: [birth year] DOA: 03/18/2017  PCP: [name], MD  Admit date: 03/18/2017 Discharge date: 03/20/2017  Admitted From: Home Disposition: Home  Recommendations for Outpatient Follow-up:  1. Follow up with PCP in 1-2 weeks 2. Please obtain BMP/CBC in one week.  Home Health: NA Equipment/Devices:NA  Discharge Condition: Stable CODE STATUS: Full Code Diet recommendation: Diet heart healthy/carb modified Room service appropriate? Yes; Fluid consistency: Thin Diet - low sodium heart healthy  Brief/Interim Summary: Kathleen Rodriguez a 51 y.o.femalewith medical history significant for persistent asthma with exacerbations required intubation 2016, obstructive sleep apnea, hypertension, upper airway cough syndrome, chronic CHF presents to the emergency Department chief complaint worsening shortness of breath. Initial evaluation reveals respiratory distress likely related to asthma exacerbation. She reports almost 2 week history of gradually worsening shortness of breath. She saw her pulmonologist 10 days ago and states she was doing "OK". She received a prescription for prednisone but she continues to have gradual worsening SOB, increased nonproductive cough and chest pain.   Discharge Diagnoses:  Principal Problem:   Acute respiratory distress Active Problems:   Uncontrolled persistent asthma   OSA (obstructive sleep apnea)   GERD (gastroesophageal reflux disease)   Chronic cough   Morbid obesity (HCC)   Essential hypertension   Sinus tachycardia (HCC)   Anemia, iron deficiency   Acute asthma exacerbation   Acute respiratory distress related to uncontrolled persistent asthma with exacerbation.  Patient with a history of same. In the emergency department 2 weeks ago with same. Increased oxygen demand. She is not on oxygen at home. Chest x-ray stable no acute cardiopulmonary process. Oxygen saturation level greater than 90%  on 2 L at rest and drops to 90% with ambulation -Intake/output is positive, given Lasix to keep negative fluid balance. -On the day of discharge she is in room air sats 97%.  Uncontrolled persistent asthma/exacerbation. Chart review indicates she was evaluated by pulmonology 52days ago. She was developing congestion at that time according to the note. He recommended starting Flovent and continuing albuterol. Was recommending spacer device -Continue doing home medications. -Had antibiotics and Solu-Medrol in the hospital because of suspicion of bronchitis. -Discharged on prednisone taper and Mucinex.  Left ear ache -Patient mentioned earache and sinus drainage and same time. Does sure there is infection or not. -Discharged home on levofloxacin 5 more days and acetic acid eardrops.   Sinus tachycardia. Likely related to nebulizers. EKG as noted above. Initial troponin negative -Monitor -Cycle troponin -Antitussives  Hypertension. Blood pressure high end of normal in the emergency department. Home medications no antihypertensives -This is down to normal on discharge  Obstructive sleep apnea. Reports compliance with C Pap -Respiratory therapy consult for sleep apnea   Discharge Instructions  Discharge Instructions    Diet - low sodium heart healthy    Complete by:  As directed    Increase activity slowly    Complete by:  As directed      Allergies as of 03/20/2017      Reactions   Latex Itching, Rash   Tomato Hives, Itching, Other (See Comments)   ALSO REACTS TO KETCHUP   Wool Alcohol [lanolin] Itching      Medication List    TAKE these medications   acetaminophen 500 MG tablet Commonly known as:  TYLENOL Take 1,000 mg by mouth every 6 (six) hours as needed for mild pain.   acetic acid 2 % otic solution Commonly known as:  VOSOL Place 4  drops into the left ear 4 (four) times daily.   albuterol 108 (90 Base) MCG/ACT inhaler Commonly known as:  VENTOLIN  HFA Inhale 2 puffs into the lungs every 6 (six) hours as needed for wheezing.   fluticasone 44 MCG/ACT inhaler Commonly known as:  FLOVENT HFA Inhale 2 puffs into the lungs 2 (two) times daily.   fluticasone 50 MCG/ACT nasal spray Commonly known as:  FLONASE Place 2 sprays into both nostrils daily.   guaiFENesin 600 MG 12 hr tablet Commonly known as:  MUCINEX Take 2 tablets (1,200 mg total) by mouth 2 (two) times daily.   levofloxacin 750 MG tablet Commonly known as:  LEVAQUIN Take 1 tablet (750 mg total) by mouth daily.   methocarbamol 500 MG tablet Commonly known as:  ROBAXIN Take 500 mg by mouth daily as needed for muscle spasms.   predniSONE 10 MG (21) Tbpk tablet Commonly known as:  STERAPRED UNI-PAK 21 TAB Take 6-5-4-3-2-1 tabs PO daily till gone       Allergies  Allergen Reactions  . Latex Itching and Rash  . Tomato Hives, Itching and Other (See Comments)    ALSO REACTS TO KETCHUP  . Wool Alcohol [Lanolin] Itching    Consultations: None  Procedures (Echo, Carotid, EGD, Colonoscopy, ERCP)   Radiological studies: Dg Chest Portable 1 View  Result Date: 03/18/2017 CLINICAL DATA:  Shortness of breath for 2 days. History of pneumonia, CHF. EXAM: PORTABLE CHEST 1 VIEW COMPARISON:  Chest radiograph February 18, 2017 FINDINGS: Cardiomediastinal silhouette is normal. No pleural effusions or focal consolidations. Trachea projects midline and there is no pneumothorax. Soft tissue planes and included osseous structures are non-suspicious. IMPRESSION: Stable examination:  No acute cardiopulmonary process. Electronically Signed   By: Awilda Metroourtnay  Bloomer M.D.   On: 03/18/2017 05:11   Dg Chest Port 1 View  Result Date: 02/18/2017 CLINICAL DATA:  Dyspnea and chest tightness. History of asthma. Wheezing and rhonchi. EXAM: PORTABLE CHEST 1 VIEW COMPARISON:  06/24/2016 FINDINGS: The heart size and mediastinal contours are within normal limits. Both lungs are clear. The visualized  skeletal structures are unremarkable. IMPRESSION: No active disease. Electronically Signed   By: Tollie Ethavid  Kwon M.D.   On: 02/18/2017 20:41     Subjective:  Discharge Exam: Vitals:   03/19/17 2113 03/20/17 0035 03/20/17 0535 03/20/17 1003  BP: 131/77 122/70 136/73   Pulse: (!) 103 88 96   Resp: 20 20 20    Temp: 98.1 F (36.7 C) 98.2 F (36.8 C) 98.3 F (36.8 C)   TempSrc: Oral Oral Oral   SpO2: 97% 96% 97% 97%  Weight:   118.6 kg (261 lb 8 oz)   Height:       General: Pt is alert, awake, not in acute distress Cardiovascular: RRR, S1/S2 +, no rubs, no gallops Respiratory: CTA bilaterally, no wheezing, no rhonchi Abdominal: Soft, NT, ND, bowel sounds + Extremities: no edema, no cyanosis   The results of significant diagnostics from this hospitalization (including imaging, microbiology, ancillary and laboratory) are listed below for reference.    Microbiology: Recent Results (from the past 240 hour(s))  Culture, Urine     Status: None   Collection Time: 03/18/17 12:06 PM  Result Value Ref Range Status   Specimen Description URINE, CLEAN CATCH  Final   Special Requests NONE  Final   Culture NO GROWTH  Final   Report Status 03/19/2017 FINAL  Final     Labs: BNP (last 3 results)  Recent Labs  05/13/16 0146 06/23/16  2359  BNP 19.3 24.0   Basic Metabolic Panel:  Recent Labs Lab 03/18/17 0615 03/20/17 0252  NA 138 138  K 3.6 4.3  CL 108 105  CO2 23 26  GLUCOSE 142* 115*  BUN 11 18  CREATININE 0.83 0.92  CALCIUM 8.4* 9.1   Liver Function Tests: No results for input(s): AST, ALT, ALKPHOS, BILITOT, PROT, ALBUMIN in the last 168 hours. No results for input(s): LIPASE, AMYLASE in the last 168 hours. No results for input(s): AMMONIA in the last 168 hours. CBC:  Recent Labs Lab 03/18/17 0615 03/19/17 0521  WBC 8.6 14.0*  NEUTROABS 6.3  --   HGB 11.4* 11.3*  HCT 37.3 35.7*  MCV 82.0 80.8  PLT 319 333   Cardiac Enzymes:  Recent Labs Lab 03/18/17 1832   TROPONINI <0.03   BNP: Invalid input(s): POCBNP CBG: No results for input(s): GLUCAP in the last 168 hours. D-Dimer No results for input(s): DDIMER in the last 72 hours. Hgb A1c No results for input(s): HGBA1C in the last 72 hours. Lipid Profile No results for input(s): CHOL, HDL, LDLCALC, TRIG, CHOLHDL, LDLDIRECT in the last 72 hours. Thyroid function studies No results for input(s): TSH, T4TOTAL, T3FREE, THYROIDAB in the last 72 hours.  Invalid input(s): FREET3 Anemia work up No results for input(s): VITAMINB12, FOLATE, FERRITIN, TIBC, IRON, RETICCTPCT in the last 72 hours. Urinalysis    Component Value Date/Time   COLORURINE AMBER (A) 08/25/2016 0940   APPEARANCEUR CLEAR 08/25/2016 0940   LABSPEC 1.037 (H) 08/25/2016 0940   PHURINE 6.0 08/25/2016 0940   GLUCOSEU NEGATIVE 08/25/2016 0940   HGBUR NEGATIVE 08/25/2016 0940   BILIRUBINUR NEGATIVE 08/25/2016 0940   BILIRUBINUR neg 05/26/2016 1008   KETONESUR NEGATIVE 08/25/2016 0940   PROTEINUR NEGATIVE 08/25/2016 0940   UROBILINOGEN 0.2 05/26/2016 1008   UROBILINOGEN 1.0 03/22/2014 0805   NITRITE NEGATIVE 08/25/2016 0940   LEUKOCYTESUR NEGATIVE 08/25/2016 0940   Sepsis Labs Invalid input(s): PROCALCITONIN,  WBC,  LACTICIDVEN Microbiology Recent Results (from the past 240 hour(s))  Culture, Urine     Status: None   Collection Time: 03/18/17 12:06 PM  Result Value Ref Range Status   Specimen Description URINE, CLEAN CATCH  Final   Special Requests NONE  Final   Culture NO GROWTH  Final   Report Status 03/19/2017 FINAL  Final     Time coordinating discharge: Over 30 minutes  SIGNED:   Clint Lipps, MD  [facility] 03/20/2017, 10:51 AM Pager   If 7PM-7AM, please contact night-coverage [email or web address] Password [email or web address]

## 2017-03-21 MED FILL — ?PREDNISONE 10 MG TABLET: 10 | 6 days supply | Qty: 21 | Fill #0

## 2017-03-22 ENCOUNTER — Telehealth: Payer: Self-pay | Admitting: Pulmonary Disease

## 2017-03-22 NOTE — Telephone Encounter (Signed)
Called and spoke with pts husband and he was very tearful on the phone.  He stated that his wife is very sick and he is trying to find help for her.  He stated that she is a substance abuser and she has been using again.  He is not really sure how to go about getting her help, but thought he would start with our office.  I advised the pts husband that I would do some research and call him back.  VS any ideas?  Thanks

## 2017-03-22 NOTE — Telephone Encounter (Signed)
Called and spoke with the pts husband and gave him the following numbers:  [facility]--[phone] [facility]--[phone] [facility]:  [phone]

## 2017-03-28 NOTE — Telephone Encounter (Signed)
Noted  

## 2017-03-30 ENCOUNTER — Ambulatory Visit: Payer: Medicaid Other

## 2017-03-30 NOTE — Progress Notes (Deleted)
Patient ID: Kathleen Rodriguez, female   DOB: [birth year], 51 y.o.   MRN: [id number] After recent hospitalization 5/26-5/28/2018 for breathing difficulties related to asthma.    From hospital note: Acute respiratory distress related to uncontrolled persistent asthma with exacerbation.  Patient with a history of same. In the emergency department 2 weeks ago with same. Increased oxygen demand. She is not on oxygen at home. Chest x-ray stable no acute cardiopulmonary process. Oxygen saturation level greater than 90% on 2 L at rest and drops to 90% with ambulation -Intake/output is positive, given Lasix to keep negative fluid balance. -On the day of discharge she is in room air sats 97%.  Uncontrolled persistent asthma/exacerbation.Chart review indicates she was evaluated by pulmonology 15days ago. She was developing congestion at that time according to the note. He recommended starting Flovent and continuing albuterol. Was recommending spacer device -Continue doing home medications. -Had antibiotics and Solu-Medrol in the hospital because of suspicion of bronchitis. -Discharged on prednisone taper and Mucinex.  Left ear ache -Patient mentioned earache and sinus drainage and same time. Does sure there is infection or not. -Discharged home on levofloxacin 5 more days and acetic acid eardrops.   Sinus tachycardia. Likely related to nebulizers. EKG as noted above. Initial troponin negative -Monitor -Cycle troponin -Antitussives  Hypertension.Blood pressure high end of normal in the emergency department. Home medications no antihypertensives -This is down to normal on discharge  Obstructive sleep apnea. Reports compliance with C Pap -Respiratory therapy consult for sleep apnea

## 2017-03-31 MED FILL — levoFLOXacin 750 MG TABS: 750 | 5 days supply | Qty: 5 | Fill #0

## 2017-03-31 MED FILL — !VENTOLIN HFA INHALER: 108 (90 BAS | 25 days supply | Qty: 18 | Fill #2

## 2017-03-31 MED FILL — FLUCONAZOLE 150 MG TABLET: 150 | 7 days supply | Qty: 2 | Fill #1

## 2017-04-17 ENCOUNTER — Ambulatory Visit: Payer: Medicaid Other | Admitting: Acute Care

## 2017-04-21 ENCOUNTER — Encounter: Payer: Medicaid Other | Admitting: Family Medicine

## 2017-05-12 ENCOUNTER — Other Ambulatory Visit: Payer: Self-pay | Admitting: Family Medicine

## 2017-05-12 ENCOUNTER — Ambulatory Visit: Payer: Medicaid Other | Attending: Family Medicine

## 2017-05-12 MED FILL — !VENTOLIN HFA INHALER: 108 (90 BAS | 25 days supply | Qty: 18 | Fill #3

## 2017-05-12 MED FILL — !FLOVENT HFA 44 MCG INHALER: 44 MCG | 30 days supply | Qty: 1 | Fill #1

## 2017-05-12 MED FILL — METHOCARBAMOL 500 MG TABLET: 500 | 8 days supply | Qty: 30 | Fill #0

## 2017-05-18 ENCOUNTER — Ambulatory Visit: Payer: Medicaid Other

## 2017-05-22 ENCOUNTER — Telehealth: Payer: Self-pay | Admitting: Pulmonary Disease

## 2017-05-22 NOTE — Telephone Encounter (Signed)
Spoke with pt, provided info regarding how to get in contact with medical records for forms to be filled out.  Nothing further needed at this time.

## 2017-05-25 ENCOUNTER — Telehealth: Payer: Self-pay | Admitting: Pulmonary Disease

## 2017-05-25 NOTE — Telephone Encounter (Signed)
If she is having appointment with TP on 05/26/17, then it would be best to have assessment in office first before determining what details needed to be included in a letter and whether she still meets criteria to qualify for handicap placard.  I believe she qualified last time based on her inability to walk more than 200 feet without needing to rest.

## 2017-05-25 NOTE — Telephone Encounter (Signed)
I will not call the pt back as there is no issue with having this taken care of. Per triage protocol, message will be closed.

## 2017-05-25 NOTE — Telephone Encounter (Signed)
Called spoke with patient who is requesting a letter stating her Asthma diagnosis, the severity and the treatments she uses for her symptoms (pt stated she uses a nebulizer machine but there are no neb meds on her med list).  Patient states that she needs this letter "because her power is getting ready to be cut off" and she needs the letter so this will not happen.  Patient is also requesting another handicap placard Patient has an appt with TP tomorrow 8.3.18 and will pick these items up at that time  VS please advise, thank you.  **no call back needed unless there is an issue**

## 2017-05-26 ENCOUNTER — Encounter: Payer: Self-pay | Admitting: Family Medicine

## 2017-05-26 ENCOUNTER — Ambulatory Visit (INDEPENDENT_AMBULATORY_CARE_PROVIDER_SITE_OTHER): Payer: Self-pay | Admitting: Adult Health

## 2017-05-26 ENCOUNTER — Ambulatory Visit: Payer: Medicaid Other | Attending: Family Medicine | Admitting: Family Medicine

## 2017-05-26 ENCOUNTER — Encounter: Payer: Self-pay | Admitting: Adult Health

## 2017-05-26 ENCOUNTER — Telehealth: Payer: Self-pay | Admitting: Adult Health

## 2017-05-26 VITALS — BP 98/62 | HR 102 | Temp 98.0°F | Resp 18 | Ht 65.0 in | Wt 266.0 lb

## 2017-05-26 DIAGNOSIS — N760 Acute vaginitis: Secondary | ICD-10-CM | POA: Insufficient documentation

## 2017-05-26 DIAGNOSIS — L309 Dermatitis, unspecified: Secondary | ICD-10-CM | POA: Diagnosis not present

## 2017-05-26 DIAGNOSIS — I509 Heart failure, unspecified: Secondary | ICD-10-CM | POA: Diagnosis not present

## 2017-05-26 DIAGNOSIS — M170 Bilateral primary osteoarthritis of knee: Secondary | ICD-10-CM | POA: Insufficient documentation

## 2017-05-26 DIAGNOSIS — B9689 Other specified bacterial agents as the cause of diseases classified elsewhere: Secondary | ICD-10-CM | POA: Insufficient documentation

## 2017-05-26 DIAGNOSIS — G4733 Obstructive sleep apnea (adult) (pediatric): Secondary | ICD-10-CM | POA: Diagnosis not present

## 2017-05-26 DIAGNOSIS — J4530 Mild persistent asthma, uncomplicated: Secondary | ICD-10-CM | POA: Insufficient documentation

## 2017-05-26 DIAGNOSIS — I110 Hypertensive heart disease with heart failure: Secondary | ICD-10-CM | POA: Diagnosis not present

## 2017-05-26 DIAGNOSIS — J45901 Unspecified asthma with (acute) exacerbation: Secondary | ICD-10-CM

## 2017-05-26 DIAGNOSIS — L308 Other specified dermatitis: Secondary | ICD-10-CM

## 2017-05-26 DIAGNOSIS — Z96652 Presence of left artificial knee joint: Secondary | ICD-10-CM | POA: Diagnosis not present

## 2017-05-26 DIAGNOSIS — J45998 Other asthma: Secondary | ICD-10-CM

## 2017-05-26 DIAGNOSIS — K219 Gastro-esophageal reflux disease without esophagitis: Secondary | ICD-10-CM | POA: Diagnosis not present

## 2017-05-26 LAB — POCT URINALYSIS DIPSTICK
Blood, UA: NEGATIVE
GLUCOSE UA: NEGATIVE
LEUKOCYTES UA: NEGATIVE
NITRITE UA: NEGATIVE
PH UA: 5 (ref 5.0–8.0)
Protein, UA: 30
Spec Grav, UA: >=1.03 — AB (ref 1.010–1.025)
UROBILINOGEN UA: 1 U/dL

## 2017-05-26 MED ORDER — MOMETASONE FURO-FORMOTEROL FUM 200-5 MCG/ACT IN AERO: 2.0000 | INHALATION_SPRAY | Freq: Two times a day (BID) | RESPIRATORY_TRACT | 3 refills | Status: DC

## 2017-05-26 MED ORDER — TRIAMCINOLONE ACETONIDE 0.1 % EX CREA: 1.0000 "application " | TOPICAL_CREAM | Freq: Two times a day (BID) | CUTANEOUS | 1 refills | Status: DC

## 2017-05-26 MED ORDER — FLUCONAZOLE 150 MG PO TABS: 150.0000 mg | ORAL_TABLET | Freq: Once | ORAL | 0 refills | Status: AC

## 2017-05-26 MED ORDER — PREDNISONE 10 MG PO TABS: ORAL_TABLET | ORAL | 0 refills | Status: DC

## 2017-05-26 MED ORDER — MOMETASONE FURO-FORMOTEROL FUM 200-5 MCG/ACT IN AERO: 2.0000 | INHALATION_SPRAY | Freq: Two times a day (BID) | RESPIRATORY_TRACT | 0 refills | Status: DC

## 2017-05-26 MED ORDER — METRONIDAZOLE 0.75 % VA GEL: 1.0000 | Freq: Every day | VAGINAL | 0 refills | Status: DC

## 2017-05-26 MED ORDER — LEVALBUTEROL HCL 0.63 MG/3ML IN NEBU
0.6300 mg | INHALATION_SOLUTION | Freq: Once | RESPIRATORY_TRACT | Status: AC
  Administered 2017-05-26: 0.63 mg via RESPIRATORY_TRACT

## 2017-05-26 NOTE — Patient Instructions (Addendum)
Stop Flovent .  Begin Dulera 200 2 puffs Twice daily  , rinse after use.  Prednisone taper over next week.  Begin Zyrtec 10 mg at bedtime. Mucinex DM twice daily as needed for cough and congestion. Reflux diet Avoid all Mint  products Use albuterol nebulizer as rescue only. For wheezing and shortness of breath. Continue on CPAP at bedtime. Goal is to wear at least 6 hours each night Work on weight loss Follow-up with community health and wellness today as planned.  Followup with Dr. Craige CottaSood in 2 months and As needed  .  Please contact office for sooner follow up if symptoms do not improve or worsen or seek emergency care

## 2017-05-26 NOTE — Addendum Note (Signed)
Addended by: Boone MasterJONES, JESSICA E on: 05/26/2017 02:09 PM   Modules accepted: Orders

## 2017-05-26 NOTE — Progress Notes (Signed)
Subjective:  Patient ID: Kathleen Rodriguez, female    DOB: 05/18/1966  Age: 51 y.o. MRN: [id number]  CC: Urinary Tract Infection   HPI Kathleen AgeLouise H Curbow a 51 year old female with a history of asthma, obstructive sleep apnea (on CPAP), bilateral knee osteoarthritis (status post left total knee arthroplasty in 08/2016) eczema who presents today for follow-up visit.  She was seen by pulmonary today and diagnosed with an asthma flare; her regimen was changed and she was commenced on Dulera and Zyrtec, prednisone taper however she never got a prescription for the prednisone but was informed it was sent to her pharmacy.  She complains of vaginal itch and an ordor but denies dysuria, urinary frequency.  She complains of pruritic rash on her elbows and upper back which she breaks out in when "she eats what she is not supposed to eat". Denies fever.  Requests prescription for a hospital bed because when she has asthma flares she has orthopnea. She has no diagnosis of congestive heart failure.  Her knees hurt a lot and she is undecided about a right knee surgery as well due to the fact that she does not think her left is doing much better. She has not been to see her orthopedic lately.  Past Medical History:  Diagnosis Date  . Arthritis    "knees, lower back; legs, ankles" (01/27/2016)  . Asthma    followed by Dr. Craige CottaSood  . CHF (congestive heart failure) (HCC) 2016   "when I went into a coma"  . Cocaine abuse   . 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 Dyspnea   . GERD (gastroesophageal reflux disease)   . Hypertension    "doctor took me off RX in 2016" (01/27/2016)  . Influenza B April 2014   Complicated by multi-organ failure  . OSA on CPAP "since " 03/20/2013  . Pneumonia 2016  . Required emergent intubation    asthma exacerbation in 2016  . Tobacco abuse   . Upper airway cough syndrome     Past Surgical History:  Procedure Laterality Date  . BREAST SURGERY Right 1980   as a teenager  , cyst was benign  . CESAREAN SECTION  2006  . LACERATION REPAIR Right ~ 1997   "tried to cut myself"  . TOTAL KNEE ARTHROPLASTY Left 09/01/2016   Procedure: LEFT TOTAL KNEE ARTHROPLASTY;  Surgeon: Tarry KosNaiping M Xu, MD;  Location: [facility] OR;  Service: Orthopedics;  Laterality: Left;  . TUBAL LIGATION  2006    Allergies  Allergen Reactions  . Latex Itching and Rash  . Tomato Hives, Itching and Other (See Comments)    ALSO REACTS TO KETCHUP  . Wool Alcohol [Lanolin] Itching     Outpatient Medications Prior to Visit  Medication Sig Dispense Refill  . acetaminophen (TYLENOL) 500 MG tablet Take 1,000 mg by mouth every 6 (six) hours as needed for mild pain.    Marland Kitchen. albuterol (VENTOLIN HFA) 108 (90 Base) MCG/ACT inhaler Inhale 2 puffs into the lungs every 6 (six) hours as needed for wheezing. 1 Inhaler 5  . cetirizine (ZYRTEC) 10 MG chewable tablet Chew 10 mg by mouth at bedtime.    . fluticasone (FLONASE) 50 MCG/ACT nasal spray Place 2 sprays into both nostrils daily. 16 g 2  . methocarbamol (ROBAXIN) 500 MG tablet Take 500 mg by mouth daily as needed for muscle spasms.    . mometasone-formoterol (DULERA) 200-5 MCG/ACT AERO Inhale 2 puffs into the lungs 2 (two) times daily. 1 Inhaler 3  .  guaiFENesin (MUCINEX) 600 MG 12 hr tablet Take 2 tablets (1,200 mg total) by mouth 2 (two) times daily. 30 tablet 0   No facility-administered medications prior to visit.     ROS Review of Systems  Constitutional: Negative for activity change, appetite change and fatigue.  HENT: Negative for congestion, sinus pressure and sore throat.   Eyes: Negative for visual disturbance.  Respiratory: Negative for cough, chest tightness, shortness of breath and wheezing.   Cardiovascular: Negative for chest pain and palpitations.  Gastrointestinal: Negative for abdominal distention, abdominal pain and constipation.  Endocrine: Negative for polydipsia.  Genitourinary: Negative for dysuria and frequency.  Musculoskeletal:         See hpi  Skin: Positive for rash.  Neurological: Negative for tremors, light-headedness and numbness.  Hematological: Does not bruise/bleed easily.  Psychiatric/Behavioral: Negative for agitation and behavioral problems.    Objective:  BP 98/62 (BP Location: Left Arm, Patient Position: Sitting, Cuff Size: Large)   Pulse (!) 102   Temp 98 F (36.7 C) (Oral)   Resp 18   Ht 5\' 5"  (1.651 m)   Wt 266 lb (120.7 kg)   SpO2 96%   BMI 44.26 kg/m   BP/Weight 05/26/2017 05/26/2017 03/20/2017  Systolic BP 98 106 136  Diastolic BP 62 64 73  Wt. (Lbs) 266 265.8 261.5  BMI 44.26 44.23 43.52      Physical Exam  Constitutional: She is oriented to person, place, and time. She appears well-developed and well-nourished.  Cardiovascular: Normal rate, normal heart sounds and intact distal pulses.   No murmur heard. Pulmonary/Chest: Effort normal. She has wheezes. She has no rales. She exhibits no tenderness.  Abdominal: Soft. Bowel sounds are normal. She exhibits no distension and no mass. There is no tenderness.  Musculoskeletal:  Vertical left knee healed surgical scar Tenderness and crepitus on range of motion  Neurological: She is alert and oriented to person, place, and time.  Skin:  Hypopigmented rash on elbow and back  Psychiatric: She has a normal mood and affect.     Assessment & Plan:   1. Uncontrolled persistent asthma Current flare Regimen changed today by pulmonary Compliance emphasized I will write prescription for hospital bed as she request however I have informed I am sure this will be covered by her insurance  2. Status post total left knee replacement Still continues to experience pain Advised to keep appointment with her orthopedic  3. OSA (obstructive sleep apnea) Uses CPAP  4. Other eczema Flare due to not abstaining from foods that trigger her flares. - triamcinolone cream (KENALOG) 0.1 %; Apply 1 application topically 2 (two) times daily.  Dispense: 80  g; Refill: 1  5. Bacterial vaginosis - metroNIDAZOLE (METROGEL VAGINAL) 0.75 % vaginal gel; Place 1 Applicatorful vaginally at bedtime.  Dispense: 70 g; Refill: 0   Meds ordered this encounter  Medications  . fluconazole (DIFLUCAN) 150 MG tablet    Sig: Take 1 tablet (150 mg total) by mouth once.    Dispense:  1 tablet    Refill:  0  . metroNIDAZOLE (METROGEL VAGINAL) 0.75 % vaginal gel    Sig: Place 1 Applicatorful vaginally at bedtime.    Dispense:  70 g    Refill:  0  . triamcinolone cream (KENALOG) 0.1 %    Sig: Apply 1 application topically 2 (two) times daily.    Dispense:  80 g    Refill:  1    Follow-up: Return in about 1 month (around 06/26/2017) for  complete physical exam.   Jaclyn ShaggyEnobong Amao MD

## 2017-05-26 NOTE — Assessment & Plan Note (Addendum)
Mild flare  Trigger control  Change ICS to ICS/LABA  Add Zyrtec  xopenex neb x 1   Plan  . Patient Instructions  Stop Flovent .  Begin Dulera 200 2 puffs Twice daily  , rinse after use.  Prednisone taper over next week.  Begin Zyrtec 10 mg at bedtime. Mucinex DM twice daily as needed for cough and congestion. Reflux diet Avoid all Mint  products Use albuterol nebulizer as rescue only. For wheezing and shortness of breath. Continue on CPAP at bedtime. Goal is to wear at least 6 hours each night Work on weight loss Follow-up with community health and wellness today as planned.  Followup with Dr. Craige CottaSood in 2 months and As needed  .  Please contact office for sooner follow up if symptoms do not improve or worsen or seek emergency care

## 2017-05-26 NOTE — Patient Instructions (Signed)

## 2017-05-26 NOTE — Progress Notes (Signed)
@Patient  ID: Kathleen Rodriguez, female    DOB: July 24, 1966, 51 y.o.   MRN: [id number]  Chief Complaint  Patient presents with  . Follow-up    Asthma, OSA    Referring provider: Arnoldo Morale, MD  HPI: 51 yo female former smoker with moderate obstructive asthma , Chronic cough , OSA    Pulmonary tests CT chest4/15/14 >>no PE, no acute findings Labs 02/11/13 >> ANA negative, Anti-GBM Ab < 1, ANCA negative PFT 05/15/13>>FEV1 1.90 (76%), FEV1% 72, TLC 4.23 (81%), DLCO 89%, no BD PFT 03/03/17 >> FEV1 1.57 (64%), FEV1% 64, TLC 4.47 (84%), DLCO 685, + BD  Sleep tests PSG 04/19/13 >> AHI 16.4, SpO2 low 84%.  Cardiac tests Echo 04/06/14 >> mod LVH, EF 65 to 70%  05/26/2017 Follow up : Asthma  Patient presents for a follow-up visit for asthma. She says over the last 2 weeks her breathing has not been good. She complains of dry cough and wheezing. She has been using Flovent without much help. She also has albuterol that she has been using more over the last 3 days. She denies any hemoptysis, chest pain, discolored mucus, orthopnea, PND or leg swelling. Complains of watery eyes and nasal drainage.   Patient also has paperwork to be filled out for her utility company.   She also requests a handicapped placard. She says she has a hard time walking due to her breathing and also knee problems. Advise patient to advance activity as tolerated, and the importance of exercise with her lung function. Temporary handicap placard was filled out today.  Patient is on C Pap at bedtime. Encouraged on compliance.    Allergies  Allergen Reactions  . Latex Itching and Rash  . Tomato Hives, Itching and Other (See Comments)    ALSO REACTS TO KETCHUP  . Wool Alcohol [Lanolin] Itching    Immunization History  Administered Date(s) Administered  . Influenza,inj,Quad PF,36+ Mos 08/13/2013, 07/27/2015, 07/21/2016  . Tdap 08/14/2013    Past Medical History:  Diagnosis Date  . Arthritis    "knees,  lower back; legs, ankles" (01/27/2016)  . Asthma    followed by Dr. Halford Chessman  . CHF (congestive heart failure) ([facility]) 2016   "when I went into a coma"  . Cocaine abuse   . 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 Dyspnea   . GERD (gastroesophageal reflux disease)   . Hypertension    "doctor took me off RX in 2016" (01/27/2016)  . Influenza B April 1937   Complicated by multi-organ failure  . OSA on CPAP "since " 03/20/2013  . Pneumonia 2016  . Required emergent intubation    asthma exacerbation in 2016  . Tobacco abuse   . Upper airway cough syndrome     Tobacco History: History  Smoking Status  . Former Smoker  . Packs/day: 0.25  . Years: 20.00  . Types: Cigarettes  . Quit date: 01/31/2013  Smokeless Tobacco  . Never Used   Counseling given: Not Answered   Outpatient Encounter Prescriptions as of 05/26/2017  Medication Sig  . acetaminophen (TYLENOL) 500 MG tablet Take 1,000 mg by mouth every 6 (six) hours as needed for mild pain.  Marland Kitchen albuterol (VENTOLIN HFA) 108 (90 Base) MCG/ACT inhaler Inhale 2 puffs into the lungs every 6 (six) hours as needed for wheezing.  . fluticasone (FLONASE) 50 MCG/ACT nasal spray Place 2 sprays into both nostrils daily.  . fluticasone (FLOVENT HFA) 44 MCG/ACT inhaler Inhale 2 puffs into the lungs 2 (two)  times daily.  Marland Kitchen guaiFENesin (MUCINEX) 600 MG 12 hr tablet Take 2 tablets (1,200 mg total) by mouth 2 (two) times daily.  . methocarbamol (ROBAXIN) 500 MG tablet Take 500 mg by mouth daily as needed for muscle spasms.  . [DISCONTINUED] acetic acid (VOSOL) 2 % otic solution Place 4 drops into the left ear 4 (four) times daily. (Patient not taking: Reported on 05/26/2017)  . [DISCONTINUED] fluconazole (DIFLUCAN) 150 MG tablet TAKE 1 TABLET BY MOUTH ONCE. (Patient not taking: Reported on 05/26/2017)  . [DISCONTINUED] levofloxacin (LEVAQUIN) 750 MG tablet Take 1 tablet (750 mg total) by mouth daily. (Patient not taking: Reported on 05/26/2017)  . [DISCONTINUED]  predniSONE (STERAPRED UNI-PAK 21 TAB) 10 MG (21) TBPK tablet Take 6-5-4-3-2-1 tabs PO daily till gone (Patient not taking: Reported on 05/26/2017)  . [DISCONTINUED] nystatin cream (MYCOSTATIN)    No facility-administered encounter medications on file as of 05/26/2017.      Review of Systems  Constitutional:   No  weight loss, night sweats,  Fevers, chills, fatigue, or  lassitude.  HEENT:   No headaches,  Difficulty swallowing,  Tooth/dental problems, or  Sore throat,                No sneezing, itching, ear ache,  +nasal congestion, post nasal drip,   CV:  No chest pain,  Orthopnea, PND, swelling in lower extremities, anasarca, dizziness, palpitations, syncope.   GI  No heartburn, indigestion, abdominal pain, nausea, vomiting, diarrhea, change in bowel habits, loss of appetite, bloody stools.   Resp:   No wheezing.  No chest wall deformity  Skin: no rash or lesions.  GU: no dysuria, change in color of urine, no urgency or frequency.  No flank pain, no hematuria   MS:  No joint or swelling.  No decreased range of motion.  No back pain. +knee pain    Physical Exam  BP 106/64 (BP Location: Left Arm, Cuff Size: Large)   Pulse (!) 102   Ht 5\' 5"  (1.651 m)   Wt 265 lb 12.8 oz (120.6 kg)   SpO2 96%   BMI 44.23 kg/m   GEN: A/Ox3; pleasant , NAD, obese    HEENT:  [place]/AT,  EACs-clear, TMs-wnl, NOSE-clear, THROAT-clear, no lesions, no postnasal drip or exudate noted. Class 2 MP airway   NECK:  Supple w/ fair ROM; no JVD; normal carotid impulses w/o bruits; no thyromegaly or nodules palpated; no lymphadenopathy.    RESP  Few trace wheezes , speaking in full sentences ,  no accessory muscle use, no dullness to percussion  CARD:  RRR, no m/r/g, no peripheral edema, pulses intact, no cyanosis or clubbing.  GI:   Soft & nt; nml bowel sounds; no organomegaly or masses detected.   Musco: Warm bil, no deformities or joint swelling noted.   Neuro: alert, no focal deficits noted.     Skin: Warm, no lesions or rashes    Lab Results:  CBC  BNP   Imaging: No results found.   Assessment & Plan:   Asthma exacerbation Mild flare  Trigger control  Change ICS to ICS/LABA  Add Zyrtec  xopenex neb x 1   Plan  . Patient Instructions  Stop Flovent .  Rodriguez Dulera 200 2 puffs Twice daily  , rinse after use.  Prednisone taper over next week.  Rodriguez Zyrtec 10 mg at bedtime. Mucinex DM twice daily as needed for cough and congestion. Reflux diet Avoid all Mint  products Use albuterol nebulizer as rescue  only. For wheezing and shortness of breath. Continue on CPAP at bedtime. Goal is to wear at least 6 hours each night Work on weight loss Follow-up with community health and wellness today as planned.  Followup with Dr. Halford Chessman in 2 months and As needed  .  Please contact office for sooner follow up if symptoms do not improve or worsen or seek emergency care       OSA (obstructive sleep apnea) Wear CPAP At bedtime        [name], NP 05/26/2017

## 2017-05-26 NOTE — Telephone Encounter (Signed)
Spoke with pt. She is needing her prednisone prescription sent in. Spoke with Shanda BumpsJessica on clarification for prescription. Rx has been sent in. Nothing further was needed.

## 2017-05-26 NOTE — Assessment & Plan Note (Signed)
Wear CPAP At bedtime

## 2017-05-28 NOTE — Progress Notes (Signed)
I have reviewed and agree with assessment/plan.  Coralyn HellingVineet Gaige Fussner, MD [facility] 05/28/2017, 11:29 PM Pager:  [phone]

## 2017-05-29 MED FILL — ?PREDNISONE 10 MG TABLET: 10 | 8 days supply | Qty: 20 | Fill #0

## 2017-05-29 MED FILL — VANDAZOLE VAGINAL 0.75% GEL: 0.75 | 5 days supply | Qty: 70 | Fill #0

## 2017-05-29 MED FILL — FLUCONAZOLE 150 MG TABLET: 150 | 1 days supply | Qty: 1 | Fill #0

## 2017-05-29 MED FILL — ?TRIAMCINOLONE 0.1% CREAM: 0.1 | 40 days supply | Qty: 80 | Fill #0

## 2017-06-02 ENCOUNTER — Telehealth: Payer: Self-pay

## 2017-06-02 NOTE — Telephone Encounter (Signed)
[facility]

## 2017-06-05 ENCOUNTER — Telehealth: Payer: Self-pay | Admitting: Family Medicine

## 2017-06-05 NOTE — Telephone Encounter (Signed)
Call placed to [facility] [phone] in regards to patient's referral. Spoke with Reuel Boomaniel and he informed me that the referral for (hospital bed) was received on 8/10. At the moment the referral is in process.

## 2017-06-12 ENCOUNTER — Other Ambulatory Visit: Payer: Self-pay | Admitting: Family Medicine

## 2017-06-12 DIAGNOSIS — I10 Essential (primary) hypertension: Secondary | ICD-10-CM

## 2017-06-12 DIAGNOSIS — G4733 Obstructive sleep apnea (adult) (pediatric): Secondary | ICD-10-CM

## 2017-06-12 DIAGNOSIS — Z79899 Other long term (current) drug therapy: Secondary | ICD-10-CM

## 2017-06-12 MED ORDER — ALBUTEROL SULFATE HFA 108 (90 BASE) MCG/ACT IN AERS: 2.0000 | INHALATION_SPRAY | Freq: Four times a day (QID) | RESPIRATORY_TRACT | 3 refills | Status: DC | PRN

## 2017-06-19 ENCOUNTER — Ambulatory Visit (INDEPENDENT_AMBULATORY_CARE_PROVIDER_SITE_OTHER): Payer: Self-pay

## 2017-06-19 ENCOUNTER — Other Ambulatory Visit: Payer: Self-pay | Admitting: Adult Health

## 2017-06-19 ENCOUNTER — Ambulatory Visit (INDEPENDENT_AMBULATORY_CARE_PROVIDER_SITE_OTHER): Payer: Self-pay | Admitting: Orthopaedic Surgery

## 2017-06-19 ENCOUNTER — Encounter (INDEPENDENT_AMBULATORY_CARE_PROVIDER_SITE_OTHER): Payer: Self-pay | Admitting: Orthopaedic Surgery

## 2017-06-19 DIAGNOSIS — M1711 Unilateral primary osteoarthritis, right knee: Secondary | ICD-10-CM

## 2017-06-19 DIAGNOSIS — Z96652 Presence of left artificial knee joint: Secondary | ICD-10-CM

## 2017-06-19 MED ORDER — TRAMADOL HCL 50 MG PO TABS: 50.0000 mg | ORAL_TABLET | Freq: Four times a day (QID) | ORAL | 0 refills | Status: DC | PRN

## 2017-06-19 MED FILL — traMADol HCL 50 MG TABS: 50 | 7 days supply | Qty: 30 | Fill #0

## 2017-06-19 NOTE — Progress Notes (Signed)
Office Visit Note   Patient: Kathleen Rodriguez           Date of Birth: 30-Aug-1966           MRN: [id number] Visit Date: 06/19/2017              Requested by: Kathleen Shaggy, MD [address] [place], [place] [address] PCP: Kathleen Shaggy, MD   Assessment & Plan: Visit Diagnoses:  1. Status post total left knee replacement   2. Unilateral primary osteoarthritis, right knee     Plan: Patient's left knee replacement doing well. Follow up in 1 year for this. For her right knee she is ready for a right knee replacement. She understands risks benefits alternatives to surgery she wishes to proceed. We will get her scheduled in the near future. Tramadol was prescribed today.  Follow-Up Instructions: Return if symptoms worsen or fail to improve.   Orders:  Orders Placed This Encounter  Procedures  . XR Knee 1-2 Views Right  . XR Knee 1-2 Views Left   Meds ordered this encounter  Medications  . traMADol (ULTRAM) 50 MG tablet    Sig: Take 1 tablet (50 mg total) by mouth every 6 (six) hours as needed.    Dispense:  30 tablet    Refill:  0      Procedures: No procedures performed   Clinical Data: No additional findings.   Subjective: Chief Complaint  Patient presents with  . Left Knee - Pain    Ms. Kathleen Rodriguez is 10 months status post left total knee replacement. She is doing well. She is interested in having her right knee done. She is having significant pain and disability with ADLs. She is walking with a limp. She is taking Tylenol currently. Her pain is 8 out of 10.    Review of Systems  Constitutional: Negative.   HENT: Negative.   Eyes: Negative.   Respiratory: Negative.   Cardiovascular: Negative.   Endocrine: Negative.   Musculoskeletal: Negative.   Neurological: Negative.   Hematological: Negative.   Psychiatric/Behavioral: Negative.   All other systems reviewed and are negative.    Objective: Vital Signs: There were no vitals taken for this  visit.  Physical Exam  Constitutional: She is oriented to person, place, and time. She appears well-developed and well-nourished.  HENT:  Head: Normocephalic and atraumatic.  Eyes: EOM are normal.  Neck: Neck supple.  Pulmonary/Chest: Effort normal.  Abdominal: Soft.  Neurological: She is alert and oriented to person, place, and time.  Skin: Skin is warm. Capillary refill takes less than 2 seconds.  Psychiatric: She has a normal mood and affect. Her behavior is normal. Judgment and thought content normal.  Nursing note and vitals reviewed.   Ortho Exam Left knee exam is benign. Well-healed surgical scar. Excellent range of motion.  Right knee exam shows varus deformity. Limited flexion secondary to pain. Specialty Comments:  No specialty comments available.  Imaging: Xr Knee 1-2 Views Left  Result Date: 06/19/2017 Stable left total knee replacement  Xr Knee 1-2 Views Right  Result Date: 06/19/2017 Advanced degenerative joint disease with varus deformity of the right knee    PMFS History: Patient Active Problem List   Diagnosis Date Noted  . Eczema 05/26/2017  . Osteoarthritis of left knee 09/01/2016  . 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 Knee pain, 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  . Status asthmaticus 06/24/2016  . Acute respiratory failure (HCC) 06/24/2016  . Acute asthma exacerbation 05/12/2016  . Acute  respiratory failure with hypoxia (HCC) 05/12/2016  . Tobacco abuse   . Primary osteoarthritis of left knee 03/09/2016  . Asthma with status asthmaticus 01/27/2016  . Acute respiratory distress 01/27/2016  . Non compliance with medical treatment 08/19/2015  . Anemia, iron deficiency 08/12/2015  . Essential hypertension 07/25/2015  . Sinus tachycardia (HCC) 07/25/2015  . Acute respiratory failure with hypercapnia (HCC) 07/24/2015  . COPD (chronic obstructive pulmonary disease) (HCC) 12/28/2014  . Tracheobronchitis 12/28/2014  . Dysfunctional uterine bleeding  12/28/2014  . Anemia 12/28/2014  . Pleuritic chest pain 05/16/2014  . Morbid obesity (HCC) 05/16/2014  . Chronic cough 05/01/2014  . Upper airway cough syndrome 05/01/2014  . Vaginal candidiasis 04/09/2014  . GERD (gastroesophageal reflux disease) 04/08/2014  . Medically noncompliant 04/07/2014  . Asthma exacerbation 04/06/2014  . CAP (community acquired pneumonia) 04/06/2014  . Chest pain 04/01/2014  . Hypertension 09/24/2013  . OSA (obstructive sleep apnea) 03/20/2013  . Uncontrolled persistent asthma 02/05/2013   Past Medical History:  Diagnosis Date  . Arthritis    "knees, lower back; legs, ankles" (01/27/2016)  . Asthma    followed by Dr. Craige Cotta  . CHF (congestive heart failure) (HCC) 2016   "when I went into a coma"  . Cocaine abuse   . 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 Dyspnea   . GERD (gastroesophageal reflux disease)   . Hypertension    "doctor took me off RX in 2016" (01/27/2016)  . Influenza B April 2014   Complicated by multi-organ failure  . OSA on CPAP "since " 03/20/2013  . Pneumonia 2016  . Required emergent intubation    asthma exacerbation in 2016  . Tobacco abuse   . Upper airway cough syndrome     Family History  Problem Relation Age of Onset  . Hypertension Mother   . HIV/AIDS Father     Past Surgical History:  Procedure Laterality Date  . BREAST SURGERY Right 1980   as a teenager , cyst was benign  . CESAREAN SECTION  2006  . LACERATION REPAIR Right ~ 1997   "tried to cut myself"  . 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 TUBAL LIGATION  2006   Social History   Occupational History  . Not on file.   Social History Main Topics  . Smoking status: Former Smoker    Packs/day: 0.25    Years: 20.00    Types: Cigarettes    Quit date: 01/31/2013  . Smokeless tobacco: Never Used  . Alcohol use No      Comment: 01/26/2006 "clean from drinking since 2007"  . Drug use: No     Comment: 01/27/2016 "clean from smoking crack since 2007"  . Sexual activity: Yes    Birth control/ protection: None, Surgical

## 2017-07-05 ENCOUNTER — Other Ambulatory Visit (INDEPENDENT_AMBULATORY_CARE_PROVIDER_SITE_OTHER): Payer: Self-pay | Admitting: Orthopaedic Surgery

## 2017-07-05 DIAGNOSIS — M1711 Unilateral primary osteoarthritis, right knee: Secondary | ICD-10-CM

## 2017-07-07 ENCOUNTER — Encounter: Payer: Medicaid Other | Admitting: Family Medicine

## 2017-07-09 ENCOUNTER — Other Ambulatory Visit: Payer: Self-pay

## 2017-07-09 ENCOUNTER — Emergency Department (HOSPITAL_COMMUNITY): Admission: EM | Admit: 2017-07-09 | Discharge: 2017-07-09 | Discharge status: Absent without leave | Payer: Self-pay

## 2017-07-09 ENCOUNTER — Emergency Department (HOSPITAL_COMMUNITY): Payer: Medicaid Other

## 2017-07-09 ENCOUNTER — Inpatient Hospital Stay (HOSPITAL_COMMUNITY)
Admission: EM | Admit: 2017-07-09 | Discharge: 2017-07-12 | DRG: 203 | Disposition: A | Discharge status: Home / Self Care | Payer: Medicaid Other | Attending: Internal Medicine | Admitting: Internal Medicine

## 2017-07-09 ENCOUNTER — Encounter (HOSPITAL_COMMUNITY): Payer: Self-pay

## 2017-07-09 DIAGNOSIS — A599 Trichomoniasis, unspecified: Secondary | ICD-10-CM | POA: Diagnosis present

## 2017-07-09 DIAGNOSIS — Z8249 Family history of ischemic heart disease and other diseases of the circulatory system: Secondary | ICD-10-CM

## 2017-07-09 DIAGNOSIS — Z96652 Presence of left artificial knee joint: Secondary | ICD-10-CM | POA: Diagnosis present

## 2017-07-09 DIAGNOSIS — J4541 Moderate persistent asthma with (acute) exacerbation: Secondary | ICD-10-CM | POA: Diagnosis not present

## 2017-07-09 DIAGNOSIS — E876 Hypokalemia: Secondary | ICD-10-CM

## 2017-07-09 DIAGNOSIS — G4733 Obstructive sleep apnea (adult) (pediatric): Secondary | ICD-10-CM | POA: Diagnosis not present

## 2017-07-09 DIAGNOSIS — Z91040 Latex allergy status: Secondary | ICD-10-CM

## 2017-07-09 DIAGNOSIS — M19071 Primary osteoarthritis, right ankle and foot: Secondary | ICD-10-CM | POA: Diagnosis not present

## 2017-07-09 DIAGNOSIS — Z7951 Long term (current) use of inhaled steroids: Secondary | ICD-10-CM

## 2017-07-09 DIAGNOSIS — E875 Hyperkalemia: Secondary | ICD-10-CM | POA: Diagnosis present

## 2017-07-09 DIAGNOSIS — Z87891 Personal history of nicotine dependence: Secondary | ICD-10-CM

## 2017-07-09 DIAGNOSIS — J45901 Unspecified asthma with (acute) exacerbation: Secondary | ICD-10-CM | POA: Diagnosis present

## 2017-07-09 DIAGNOSIS — Z91018 Allergy to other foods: Secondary | ICD-10-CM | POA: Diagnosis not present

## 2017-07-09 DIAGNOSIS — J209 Acute bronchitis, unspecified: Secondary | ICD-10-CM | POA: Diagnosis present

## 2017-07-09 DIAGNOSIS — M19072 Primary osteoarthritis, left ankle and foot: Secondary | ICD-10-CM | POA: Diagnosis not present

## 2017-07-09 DIAGNOSIS — R0603 Acute respiratory distress: Secondary | ICD-10-CM

## 2017-07-09 DIAGNOSIS — R0600 Dyspnea, unspecified: Secondary | ICD-10-CM

## 2017-07-09 DIAGNOSIS — M479 Spondylosis, unspecified: Secondary | ICD-10-CM | POA: Diagnosis present

## 2017-07-09 DIAGNOSIS — Z830 Family history of human immunodeficiency virus [HIV] disease: Secondary | ICD-10-CM

## 2017-07-09 DIAGNOSIS — K219 Gastro-esophageal reflux disease without esophagitis: Secondary | ICD-10-CM | POA: Diagnosis not present

## 2017-07-09 DIAGNOSIS — D72829 Elevated white blood cell count, unspecified: Secondary | ICD-10-CM

## 2017-07-09 DIAGNOSIS — M1711 Unilateral primary osteoarthritis, right knee: Secondary | ICD-10-CM | POA: Diagnosis present

## 2017-07-09 LAB — COMPREHENSIVE METABOLIC PANEL
ALK PHOS: 51 U/L (ref 38–126)
ALT: 20 U/L (ref 14–54)
ANION GAP: 7 (ref 5–15)
AST: 54 U/L — ABNORMAL HIGH (ref 15–41)
Albumin: 3.4 g/dL — ABNORMAL LOW (ref 3.5–5.0)
BILIRUBIN TOTAL: 1.3 mg/dL — AB (ref 0.3–1.2)
BUN: 8 mg/dL (ref 6–20)
CALCIUM: 7.8 mg/dL — AB (ref 8.9–10.3)
CO2: 26 mmol/L (ref 22–32)
CREATININE: 0.95 mg/dL (ref 0.44–1.00)
Chloride: 103 mmol/L (ref 101–111)
GFR calc non Af Amer: >60 mL/min (ref >60)
Glucose, Bld: 121 mg/dL — ABNORMAL HIGH (ref 65–99)
Potassium: 5.5 mmol/L — ABNORMAL HIGH (ref 3.5–5.1)
Sodium: 136 mmol/L (ref 135–145)
TOTAL PROTEIN: 6.4 g/dL — AB (ref 6.5–8.1)

## 2017-07-09 LAB — URINALYSIS, ROUTINE W REFLEX MICROSCOPIC
Glucose, UA: NEGATIVE mg/dL
KETONES UR: 15 mg/dL — AB
Leukocytes, UA: NEGATIVE
NITRITE: NEGATIVE
Specific Gravity, Urine: >1.03 — ABNORMAL HIGH (ref 1.005–1.030)
pH: 5.5 (ref 5.0–8.0)

## 2017-07-09 LAB — URINALYSIS, MICROSCOPIC (REFLEX)
RBC / HPF: NONE SEEN RBC/hpf (ref 0–5)
Squamous Epithelial / HPF: NONE SEEN
WBC, UA: NONE SEEN WBC/hpf (ref 0–5)

## 2017-07-09 LAB — BLOOD GAS, ARTERIAL
Acid-Base Excess: 1 mmol/L (ref 0.0–2.0)
Bicarbonate: 25.9 mmol/L (ref 20.0–28.0)
DRAWN BY: 270211
Delivery systems: POSITIVE
Expiratory PAP: 5
FIO2: 0.4
Inspiratory PAP: 12
MODE: POSITIVE
O2 SAT: 98.8 %
PATIENT TEMPERATURE: 98.6
PCO2 ART: 44.6 mmHg (ref 32.0–48.0)
PO2 ART: 159 mmHg — AB (ref 83.0–108.0)
pH, Arterial: 7.381 (ref 7.350–7.450)

## 2017-07-09 LAB — CBC WITH DIFFERENTIAL/PLATELET
BASOS ABS: 0 10*3/uL (ref 0.0–0.1)
BASOS PCT: 0 %
Eosinophils Absolute: 0 10*3/uL (ref 0.0–0.7)
Eosinophils Relative: 0 %
HCT: 37.1 % (ref 36.0–46.0)
HEMOGLOBIN: 12 g/dL (ref 12.0–15.0)
Lymphocytes Relative: 25 %
Lymphs Abs: 2.5 10*3/uL (ref 0.7–4.0)
MCH: 26.5 pg (ref 26.0–34.0)
MCHC: 32.3 g/dL (ref 30.0–36.0)
MCV: 82.1 fL (ref 78.0–100.0)
MONO ABS: 1.4 10*3/uL — AB (ref 0.1–1.0)
MONOS PCT: 15 %
NEUTROS ABS: 5.9 10*3/uL (ref 1.7–7.7)
NEUTROS PCT: 60 %
Platelets: 298 10*3/uL (ref 150–400)
RBC: 4.52 MIL/uL (ref 3.87–5.11)
RDW: 19.9 % — ABNORMAL HIGH (ref 11.5–15.5)
WBC: 9.8 10*3/uL (ref 4.0–10.5)

## 2017-07-09 LAB — ECHOCARDIOGRAM COMPLETE
Height: 65 in
Weight: 4201.09 oz

## 2017-07-09 LAB — BRAIN NATRIURETIC PEPTIDE: B NATRIURETIC PEPTIDE 5: 27.2 pg/mL (ref 0.0–100.0)

## 2017-07-09 LAB — TROPONIN I

## 2017-07-09 LAB — MRSA PCR SCREENING: MRSA BY PCR: NEGATIVE

## 2017-07-09 MED ORDER — METHYLPREDNISOLONE SODIUM SUCC 125 MG IJ SOLR
60.0000 mg | Freq: Four times a day (QID) | INTRAMUSCULAR | Status: DC
  Administered 2017-07-10 – 2017-07-11 (×4): 60 mg via INTRAVENOUS
  Filled 2017-07-09 (×6): qty 2

## 2017-07-09 MED ORDER — SODIUM POLYSTYRENE SULFONATE 15 GM/60ML PO SUSP: 15.0000 g | Freq: Once | ORAL | Status: DC

## 2017-07-09 MED ORDER — ALBUTEROL (5 MG/ML) CONTINUOUS INHALATION SOLN
10.0000 mg/h | INHALATION_SOLUTION | Freq: Once | RESPIRATORY_TRACT | Status: AC
  Administered 2017-07-09: 10 mg/h via RESPIRATORY_TRACT

## 2017-07-09 MED ORDER — ALBUTEROL SULFATE (2.5 MG/3ML) 0.083% IN NEBU
INHALATION_SOLUTION | RESPIRATORY_TRACT | Status: AC
  Filled 2017-07-09: qty 6

## 2017-07-09 MED ORDER — PREDNISONE 50 MG PO TABS
50.0000 mg | ORAL_TABLET | Freq: Every day | ORAL | Status: DC
  Administered 2017-07-09: 50 mg via ORAL
  Filled 2017-07-09 (×2): qty 1

## 2017-07-09 MED ORDER — DEXTROSE 5 % IV SOLN
500.0000 mg | Freq: Once | INTRAVENOUS | Status: AC
  Administered 2017-07-09: 500 mg via INTRAVENOUS
  Filled 2017-07-09: qty 500

## 2017-07-09 MED ORDER — MOMETASONE FURO-FORMOTEROL FUM 200-5 MCG/ACT IN AERO
2.0000 | INHALATION_SPRAY | Freq: Two times a day (BID) | RESPIRATORY_TRACT | Status: DC
  Administered 2017-07-10 – 2017-07-12 (×3): 2 via RESPIRATORY_TRACT
  Filled 2017-07-09 (×2): qty 8.8

## 2017-07-09 MED ORDER — FLUTICASONE PROPIONATE 50 MCG/ACT NA SUSP
2.0000 | Freq: Every day | NASAL | Status: DC
  Administered 2017-07-09 – 2017-07-11 (×3): 2 via NASAL
  Filled 2017-07-09: qty 16

## 2017-07-09 MED ORDER — MAGNESIUM SULFATE 2 GM/50ML IV SOLN
2.0000 g | Freq: Once | INTRAVENOUS | Status: AC
  Administered 2017-07-09: 2 g via INTRAVENOUS
  Filled 2017-07-09: qty 50

## 2017-07-09 MED ORDER — DEXTROSE 5 % IV SOLN
1.0000 g | Freq: Once | INTRAVENOUS | Status: AC
  Administered 2017-07-09: 1 g via INTRAVENOUS
  Filled 2017-07-09: qty 10

## 2017-07-09 MED ORDER — METHOCARBAMOL 500 MG PO TABS
500.0000 mg | ORAL_TABLET | Freq: Every day | ORAL | Status: DC | PRN
  Administered 2017-07-09 – 2017-07-12 (×3): 500 mg via ORAL
  Filled 2017-07-09 (×3): qty 1

## 2017-07-09 MED ORDER — DEXTROMETHORPHAN POLISTIREX ER 30 MG/5ML PO SUER
30.0000 mg | Freq: Two times a day (BID) | ORAL | Status: DC | PRN
  Administered 2017-07-09 – 2017-07-11 (×2): 30 mg via ORAL
  Filled 2017-07-09 (×2): qty 5

## 2017-07-09 MED ORDER — ALBUTEROL (5 MG/ML) CONTINUOUS INHALATION SOLN
5.0000 mg/h | INHALATION_SOLUTION | Freq: Once | RESPIRATORY_TRACT | Status: AC
  Administered 2017-07-09: 5 mg/h via RESPIRATORY_TRACT
  Filled 2017-07-09: qty 20

## 2017-07-09 MED ORDER — METHYLPREDNISOLONE SODIUM SUCC 125 MG IJ SOLR
125.0000 mg | Freq: Once | INTRAMUSCULAR | Status: AC
  Administered 2017-07-09: 125 mg via INTRAVENOUS
  Filled 2017-07-09: qty 2

## 2017-07-09 MED ORDER — ALBUTEROL (5 MG/ML) CONTINUOUS INHALATION SOLN
5.0000 mg/h | INHALATION_SOLUTION | Freq: Once | RESPIRATORY_TRACT | Status: DC
  Filled 2017-07-09: qty 20

## 2017-07-09 MED ORDER — ENOXAPARIN SODIUM 40 MG/0.4ML ~~LOC~~ SOLN
40.0000 mg | SUBCUTANEOUS | Status: DC
  Administered 2017-07-09 – 2017-07-11 (×3): 40 mg via SUBCUTANEOUS
  Filled 2017-07-09 (×3): qty 0.4

## 2017-07-09 MED ORDER — PANTOPRAZOLE SODIUM 40 MG PO TBEC: 40.0000 mg | DELAYED_RELEASE_TABLET | Freq: Every day | ORAL | Status: DC

## 2017-07-09 MED ORDER — ALBUTEROL SULFATE (2.5 MG/3ML) 0.083% IN NEBU
2.5000 mg | INHALATION_SOLUTION | RESPIRATORY_TRACT | Status: DC
  Administered 2017-07-09 – 2017-07-11 (×13): 2.5 mg via RESPIRATORY_TRACT
  Filled 2017-07-09 (×14): qty 3

## 2017-07-09 MED ORDER — ALBUTEROL (5 MG/ML) CONTINUOUS INHALATION SOLN
INHALATION_SOLUTION | RESPIRATORY_TRACT | Status: AC
  Filled 2017-07-09: qty 20

## 2017-07-09 MED ORDER — ACETAMINOPHEN 500 MG PO TABS
1000.0000 mg | ORAL_TABLET | Freq: Four times a day (QID) | ORAL | Status: DC | PRN
  Administered 2017-07-09 – 2017-07-12 (×2): 1000 mg via ORAL
  Filled 2017-07-09 (×2): qty 2

## 2017-07-09 NOTE — ED Notes (Signed)
Bed: RESB Expected date:  Expected time:  Means of arrival:  Comments: Resp distress 

## 2017-07-09 NOTE — ED Notes (Signed)
Informed MD did not get temp due to Bi pap.

## 2017-07-09 NOTE — Progress Notes (Signed)
  Echocardiogram 2D Echocardiogram has been performed.  Kathleen Rodriguez 07/09/2017, 3:32 PM

## 2017-07-09 NOTE — ED Triage Notes (Signed)
Patient presented via ems with c/o respiratory distress onset three days ago. Pt did her own douneb last night. Pt have hx of asthma . 92 % RA. Pt currently c/o chest pain and productive cough with yellow sputum. Pt already given 10 mg of albuterol and 1 mg of Atrovent and 125 of solumedrol IV.

## 2017-07-09 NOTE — H&P (Signed)
History and Physical  Kathleen Rodriguez XLK:[id number] DOB: 1966/08/05 DOA: 07/09/2017  PCP:  [name], MD   Chief Complaint:  Dyspnea   History of Present Illness:  Pt is a 51 yo female with hx of asthma who came with cc of dyspnea for the past 3 days with wheezing and cough. Cough productive of clear sputum w/o fever/chills. She also reports substernal chest pain, constant w/o radiation and LE edema for over 3 days. No hx of VTE/recent travel and reports hx of HF but not on diuretics. No other complaints. She took prednisone 40 mg from left over meds yesterday and has been using albuterol for the last 3 days but w/o significant relief. She was placed on BiPAP in the ED.   Review of Systems:  CONSTITUTIONAL:     No night sweats.  No fatigue.  No fever. No chills. Eyes:                            No visual changes.  No eye pain.  No eye discharge.   ENT:                              No epistaxis.  No sinus pain.  No sore throat.   No congestion. RESPIRATORY:           +cough.  +wheeze.  No hemoptysis.  +dyspnea CARDIOVASCULAR   :  +chest pains.  No palpitations. GASTROINTESTINAL:  No abdominal pain.  No nausea. No vomiting.  No diarrhea. No   constipation.  No hematemesis.  No hematochezia.  No melena. GENITOURINARY:      No urgency.  No frequency.  No dysuria.  No hematuria.  No   obstructive symptoms.  No discharge.  No pain.   MUSCULOSKELETAL:  No musculoskeletal pain.  No joint swelling.  No arthritis. NEUROLOGICAL:        No confusion.  No weakness. No headache. No seizure. PSYCHIATRIC:             No depression. No anxiety. No suicidal ideation. SKIN:                             No rashes.  No lesions.  No wounds. ENDOCRINE:                No weight loss.  No polydipsia.  No polyuria.  No polyphagia. HEMATOLOGIC:           No purpura.  No petechiae.  No bleeding.  ALLERGIC                 : No pruritus.  No angioedema Other:  Past Medical and Surgical History:    Past Medical History:  Diagnosis Date  . Arthritis    "knees, lower back; legs, ankles" (01/27/2016)  . Asthma    followed by Dr. Craige Cotta  . CHF (congestive heart failure) (HCC) 2016   "when I went into a coma"  . Cocaine abuse   . 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 Dyspnea   . GERD (gastroesophageal reflux disease)   . Hypertension    "doctor took me off RX in 2016" (01/27/2016)  . Influenza B April 2014   Complicated by multi-organ failure  . OSA on CPAP "since " 03/20/2013  . Pneumonia 2016  . Required emergent intubation  asthma exacerbation in 2016  . Tobacco abuse   . Upper airway cough syndrome    Past Surgical History:  Procedure Laterality Date  . BREAST SURGERY Right 1980   as a teenager , cyst was benign  . CESAREAN SECTION  2006  . LACERATION REPAIR Right ~ 1997   "tried to cut myself"  . 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 TUBAL LIGATION  2006    Social History:   reports that she quit smoking about 4 years ago. Her smoking use included Cigarettes. She has a 5.00 pack-year smoking history. She has never used smokeless tobacco. She reports that she does not drink alcohol or use drugs.   Allergies  Allergen Reactions  . Latex Itching and Rash  . Tomato Hives, Itching and Other (See Comments)    ALSO REACTS TO KETCHUP  . Wool Alcohol [Lanolin] Itching    Family History  Problem Relation Age of Onset  . Hypertension Mother   . HIV/AIDS Father       Prior to Admission medications   Medication Sig Start Date End Date Taking? Authorizing Provider  acetaminophen (TYLENOL) 500 MG tablet Take 1,000 mg by mouth every 6 (six) hours as needed for mild pain.   Yes [provider]  albuterol (VENTOLIN HFA) 108 (90 Base) MCG/ACT inhaler Inhale 2 puffs into the lungs every 6 (six) hours as needed for wheezing. 06/12/17   Yes Amao, Odette Horns, MD  fluticasone (FLONASE) 50 MCG/ACT nasal spray Place 2 sprays into both nostrils daily. 03/03/17  Yes [name], MD  methocarbamol (ROBAXIN) 500 MG tablet Take 500 mg by mouth daily as needed for muscle spasms.   Yes [provider]  mometasone-formoterol (DULERA) 200-5 MCG/ACT AERO Inhale 2 puffs into the lungs 2 (two) times daily. 05/26/17  Yes Parrett, Tammy S, NP  predniSONE (DELTASONE) 10 MG tablet Take 4 tablets for 2 days, 3 tablets for 2 days, 2 tablets for 2 days, 1 tablet for 2 days 05/26/17  Yes Parrett, Tammy S, NP  triamcinolone cream (KENALOG) 0.1 % Apply 1 application topically 2 (two) times daily. 05/26/17  Yes [name], MD    Physical Exam: BP 127/81 (BP Location: Right Arm)   Pulse 99   Temp 98.4 F (36.9 C) (Oral)   Resp (!) 24   SpO2 100%   GENERAL :   Alert and cooperative, and appears to be in mild acute distress. HEAD:           normocephalic. EYES:            PERRL, EOMI.  vision is grossly intact. EARS:           hearing grossly intact. NECK:          supple CARDIAC:    Normal S1 and S2. No gallop. No murmurs.  Vascular:     ++peripheral edema.  LUNGS:       Bilateral wheezing  ABDOMEN: Positive bowel sounds. Soft, nondistended, nontender. No guarding or rebound.      MSK:           No joint erythema or tenderness.  EXT           : No significant deformity or joint abnormality. Neuro        : Alert, oriented to person, place, and time.  CN II-XII intact.  SKIN:            No rash. No lesions. PSYCH:       No hallucination. Patient is not suicidal.          Labs on Admission:  Reviewed.   Radiological Exams on Admission: Dg Chest Port 1 View  Result Date: 07/09/2017 CLINICAL DATA:  Shortness of breath.  Chest pain. EXAM: PORTABLE CHEST 1 VIEW COMPARISON:  Mar 18, 2017 FINDINGS: The heart size and mediastinal contours are within normal limits. Both lungs are clear. The visualized skeletal structures are  unremarkable. IMPRESSION: No active disease. Electronically Signed   By: Gerome Sam III M.D   On: 07/09/2017 10:53    EKG:  Independently reviewed. Sinus tach  Assessment/Plan  Asthma exacerbation:  Possibly due to URI/viral. cxr w/o opacity, will hold abx started in the ED Start on prednisone 50 mg daily and albuterol Q4H and oxygen therapy  Continue Dulera BID Admit to stepdown and keep BiPAP at bedside if needed  LE edema: possible diastolic HF but ProBNP wnl. Will check LE dupleux to r/o DVT.  Will check Echo  Chest pain: likely related to asthma exacerbation. Well's criteria w/low probability PE; will check LE [place]. No hx of CAD. Initial EKG and trop unremarkable for ischemic event.   Hyperkalemia: will give kayexalate once and repeat k  Level in am   Input & Output: NA Lines & Tubes: PIV DVT prophylaxis: [place] enoxaparin  GI prophylaxis: PPI  Consultants: NA Code Status: Full Family Communication: none at bedside  Disposition Plan: TBD    Eston Esters M.D [facility]

## 2017-07-09 NOTE — ED Provider Notes (Signed)
[facility]-EMERGENCY DEPT Provider Note   CSN: [id number] Arrival date & time: 07/09/17  1015     History   Chief Complaint No chief complaint on file.   HPI Kathleen Rodriguez is a 51 y.o. female.  Pt presents to the ED today with a 3 day hx of sob.  The pt has a hx of severe asthma.  She has been using her nebs at home frequently without help of her sx.  Pt called EMS today due to the progressive sob.  The pt's RA O2 sat was 92% when first responders arrived.  The pt was given 10 mg of albuterol and 1 mg of atrovent and 125 of solumedrol en route by EMS.  Pt is extremely sob and is unable to give much hx.  Pt placed on bipap upon ED arrival.      Past Medical History:  Diagnosis Date  . Arthritis    "knees, lower back; legs, ankles" (01/27/2016)  . Asthma    followed by Dr. Craige Cotta  . CHF (congestive heart failure) (HCC) 2016   "when I went into a coma"  . Cocaine abuse   . 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 Dyspnea   . GERD (gastroesophageal reflux disease)   . Hypertension    "doctor took me off RX in 2016" (01/27/2016)  . Influenza B April 2014   Complicated by multi-organ failure  . OSA on CPAP "since " 03/20/2013  . Pneumonia 2016  . Required emergent intubation    asthma exacerbation in 2016  . Tobacco abuse   . Upper airway cough syndrome     Patient Active Problem List   Diagnosis Date Noted  . Eczema 05/26/2017  . Osteoarthritis of left knee 09/01/2016  . 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 Knee pain, 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  . Status asthmaticus 06/24/2016  . Acute respiratory failure (HCC) 06/24/2016  . Acute asthma exacerbation 05/12/2016  . Acute respiratory failure with hypoxia (HCC) 05/12/2016  . Tobacco abuse   . Primary osteoarthritis of left knee 03/09/2016  . Asthma with status asthmaticus 01/27/2016  . Acute respiratory distress 01/27/2016  . Non compliance with medical treatment 08/19/2015  . Anemia, iron deficiency 08/12/2015  . Essential  hypertension 07/25/2015  . Sinus tachycardia (HCC) 07/25/2015  . Acute respiratory failure with hypercapnia (HCC) 07/24/2015  . COPD (chronic obstructive pulmonary disease) (HCC) 12/28/2014  . Tracheobronchitis 12/28/2014  . Dysfunctional uterine bleeding 12/28/2014  . Anemia 12/28/2014  . Pleuritic chest pain 05/16/2014  . Morbid obesity (HCC) 05/16/2014  . Chronic cough 05/01/2014  . Upper airway cough syndrome 05/01/2014  . Vaginal candidiasis 04/09/2014  . GERD (gastroesophageal reflux disease) 04/08/2014  . Medically noncompliant 04/07/2014  . Asthma exacerbation 04/06/2014  . CAP (community acquired pneumonia) 04/06/2014  . Chest pain 04/01/2014  . Hypertension 09/24/2013  . OSA (obstructive sleep apnea) 03/20/2013  . Uncontrolled persistent asthma 02/05/2013    Past Surgical History:  Procedure Laterality Date  . BREAST SURGERY Right 1980   as a teenager , cyst was benign  . CESAREAN SECTION  2006  . LACERATION REPAIR Right ~ 1997   "tried to cut myself"  . 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 TUBAL LIGATION  2006    OB History    No data available       Home Medications  Prior to Admission medications   Medication Sig Start Date End Date Taking? Authorizing Provider  acetaminophen (TYLENOL) 500 MG tablet Take 1,000 mg by mouth every 6 (six) hours as needed for mild pain.   Yes [provider]  albuterol (VENTOLIN HFA) 108 (90 Base) MCG/ACT inhaler Inhale 2 puffs into the lungs every 6 (six) hours as needed for wheezing. 06/12/17  Yes Amao, Odette Horns, MD  fluticasone (FLONASE) 50 MCG/ACT nasal spray Place 2 sprays into both nostrils daily. 03/03/17  Yes [name], MD  methocarbamol (ROBAXIN) 500 MG tablet Take 500 mg by mouth daily as needed for muscle spasms.   Yes [provider]  mometasone-formoterol  (DULERA) 200-5 MCG/ACT AERO Inhale 2 puffs into the lungs 2 (two) times daily. 05/26/17  Yes Parrett, Tammy S, NP  predniSONE (DELTASONE) 10 MG tablet Take 4 tablets for 2 days, 3 tablets for 2 days, 2 tablets for 2 days, 1 tablet for 2 days 05/26/17  Yes Parrett, Tammy S, NP  triamcinolone cream (KENALOG) 0.1 % Apply 1 application topically 2 (two) times daily. 05/26/17  Yes [name], MD    Family History Family History  Problem Relation Age of Onset  . Hypertension Mother   . HIV/AIDS Father     Social History Social History  Substance Use Topics  . Smoking status: Former Smoker    Packs/day: 0.25    Years: 20.00    Types: Cigarettes    Quit date: 01/31/2013  . Smokeless tobacco: Never Used  . Alcohol use No     Comment: 01/26/2006 "clean from drinking since 2007"     Allergies   Latex; Tomato; and Wool alcohol [lanolin]   Review of Systems Review of Systems  Respiratory: Positive for cough, shortness of breath and wheezing.   All other systems reviewed and are negative.    Physical Exam Updated Vital Signs BP 120/84 (BP Location: Left Arm)   Pulse (!) 108   Resp (!) 26   SpO2 100%   Physical Exam  Constitutional: She is oriented to person, place, and time. She appears well-developed. She appears distressed.  HENT:  Head: Normocephalic and atraumatic.  Right Ear: External ear normal.  Left Ear: External ear normal.  Nose: Nose normal.  Mouth/Throat: Oropharynx is clear and moist.  Eyes: Pupils are equal, round, and reactive to light. Conjunctivae and EOM are normal.  Neck: Normal range of motion. Neck supple.  Cardiovascular: Regular rhythm, normal heart sounds and intact distal pulses.  Tachycardia present.   Pulmonary/Chest: She is in respiratory distress. She has wheezes. She has rales.  Abdominal: Soft. Bowel sounds are normal.  Musculoskeletal: Normal range of motion.  Neurological: She is alert and oriented to person, place, and time.  Skin: Skin is  warm.  Psychiatric: She has a normal mood and affect. Her behavior is normal. Judgment and thought content normal.  Nursing note and vitals reviewed.    ED Treatments / Results  Labs (all labs ordered are listed, but only abnormal results are displayed) Labs Reviewed  COMPREHENSIVE METABOLIC PANEL - Abnormal; Notable for the following:       Result Value   Potassium 5.5 (*)    Glucose, Bld 121 (*)    Calcium 7.8 (*)    Total Protein 6.4 (*)    Albumin 3.4 (*)    AST 54 (*)    Total Bilirubin 1.3 (*)    All other components within normal limits  CBC WITH DIFFERENTIAL/PLATELET - Abnormal; Notable for the  following:    RDW 19.9 (*)    Monocytes Absolute 1.4 (*)    All other components within normal limits  BLOOD GAS, ARTERIAL - Abnormal; Notable for the following:    pO2, Arterial 159 (*)    All other components within normal limits  TROPONIN I  BRAIN NATRIURETIC PEPTIDE  URINALYSIS, ROUTINE W REFLEX MICROSCOPIC    EKG  EKG Interpretation  Date/Time:  Sunday July 09 2017 10:19:18 EDT Ventricular Rate:  104 PR Interval:    QRS Duration: 117 QT Interval:  381 QTC Calculation: 502 R Axis:   72 Text Interpretation:  Sinus tachycardia Nonspecific intraventricular conduction delay Lateral infarct, old No significant change since last tracing Confirmed by Jacalyn Lefevre [phone]) on 07/09/2017 11:21:33 AM       Radiology Dg Chest Port 1 View  Result Date: 07/09/2017 CLINICAL DATA:  Shortness of breath.  Chest pain. EXAM: PORTABLE CHEST 1 VIEW COMPARISON:  Mar 18, 2017 FINDINGS: The heart size and mediastinal contours are within normal limits. Both lungs are clear. The visualized skeletal structures are unremarkable. IMPRESSION: No active disease. Electronically Signed   By: Gerome Sam III M.D   On: 07/09/2017 10:53    Procedures Procedures (including critical care time)  Medications Ordered in ED Medications  albuterol (PROVENTIL, VENTOLIN) (5 MG/ML) 0.5%  continuous inhalation solution (  Not Given 07/09/17 1022)  cefTRIAXone (ROCEPHIN) 1 g in dextrose 5 % 50 mL IVPB (1 g Intravenous New Bag/Given 07/09/17 1155)  azithromycin (ZITHROMAX) 500 mg in dextrose 5 % 250 mL IVPB (not administered)  albuterol (PROVENTIL,VENTOLIN) solution continuous neb (10 mg/hr Nebulization Given 07/09/17 1023)  magnesium sulfate IVPB 2 g 50 mL (2 g Intravenous New Bag/Given 07/09/17 1058)     Initial Impression / Assessment and Plan / ED Course  I have reviewed the triage vital signs and the nursing notes.  Pertinent labs & imaging results that were available during my care of the patient were reviewed by me and considered in my medical decision making (see chart for details).    CRITICAL CARE Performed by: Jacalyn Lefevre   Total critical care time: 30 minutes  Critical care time was exclusive of separately billable procedures and treating other patients.  Critical care was necessary to treat or prevent imminent or life-threatening deterioration.  Critical care was time spent personally by me on the following activities: development of treatment plan with patient and/or surrogate as well as nursing, discussions with consultants, evaluation of patient's response to treatment, examination of patient, obtaining history from patient or surrogate, ordering and performing treatments and interventions, ordering and review of laboratory studies, ordering and review of radiographic studies, pulse oximetry and re-evaluation of patient's condition.  Pt is improving with treatment, but still remains wheezy and sob.  Pt is now able to speak in full sentences on bipap.  Due to the productive cough, pt given abx.  Pt d/w Dr. Mickle Mallory ([facility]) for admission.  Final Clinical Impressions(s) / ED Diagnoses   Final diagnoses:  Moderate persistent asthma with exacerbation  Acute respiratory distress  Acute bronchitis, unspecified organism    New Prescriptions New Prescriptions    No medications on file     Jacalyn Lefevre, MD 07/09/17 1224

## 2017-07-09 NOTE — Progress Notes (Signed)
Called to room for pt.s who saturations are in the mid 53s.  Pt. Is anxious but is talking and c/o meds not working fast enough.  Repositioned pulse ox probe and spoke to patient regarding breathing and not talking at this time while she is short of breath.  Saturations increased to 97%.  MD is being called for additional advise, Medications?

## 2017-07-09 NOTE — Progress Notes (Signed)
MD ordered CAT with  albuterol over one hour.  Started as ordered and pt. Refused ABG at this time but said she may change her mind later.  Will leave order on work list at this time.

## 2017-07-10 ENCOUNTER — Encounter (HOSPITAL_COMMUNITY): Payer: Self-pay

## 2017-07-10 ENCOUNTER — Inpatient Hospital Stay (HOSPITAL_COMMUNITY): Payer: Medicaid Other

## 2017-07-10 ENCOUNTER — Other Ambulatory Visit: Payer: Self-pay

## 2017-07-10 DIAGNOSIS — R0602 Shortness of breath: Secondary | ICD-10-CM

## 2017-07-10 DIAGNOSIS — E876 Hypokalemia: Secondary | ICD-10-CM

## 2017-07-10 DIAGNOSIS — R609 Edema, unspecified: Secondary | ICD-10-CM

## 2017-07-10 DIAGNOSIS — J45901 Unspecified asthma with (acute) exacerbation: Secondary | ICD-10-CM

## 2017-07-10 LAB — COMPREHENSIVE METABOLIC PANEL
ALBUMIN: 3 g/dL — AB (ref 3.5–5.0)
ALK PHOS: 47 U/L (ref 38–126)
ALT: 19 U/L (ref 14–54)
AST: 21 U/L (ref 15–41)
Anion gap: 8 (ref 5–15)
BUN: 12 mg/dL (ref 6–20)
CALCIUM: 8.2 mg/dL — AB (ref 8.9–10.3)
CO2: 28 mmol/L (ref 22–32)
CREATININE: 0.74 mg/dL (ref 0.44–1.00)
Chloride: 103 mmol/L (ref 101–111)
GFR calc Af Amer: >60 mL/min (ref >60)
GFR calc non Af Amer: >60 mL/min (ref >60)
GLUCOSE: 156 mg/dL — AB (ref 65–99)
Potassium: 3.3 mmol/L — ABNORMAL LOW (ref 3.5–5.1)
SODIUM: 139 mmol/L (ref 135–145)
Total Bilirubin: 0.3 mg/dL (ref 0.3–1.2)
Total Protein: 6.2 g/dL — ABNORMAL LOW (ref 6.5–8.1)

## 2017-07-10 LAB — CBC
HCT: 34.4 % — ABNORMAL LOW (ref 36.0–46.0)
Hemoglobin: 11 g/dL — ABNORMAL LOW (ref 12.0–15.0)
MCH: 26 pg (ref 26.0–34.0)
MCHC: 32 g/dL (ref 30.0–36.0)
MCV: 81.3 fL (ref 78.0–100.0)
PLATELETS: 281 10*3/uL (ref 150–400)
RBC: 4.23 MIL/uL (ref 3.87–5.11)
RDW: 19.9 % — ABNORMAL HIGH (ref 11.5–15.5)
WBC: 9 10*3/uL (ref 4.0–10.5)

## 2017-07-10 LAB — HIV ANTIBODY (ROUTINE TESTING W REFLEX): HIV Screen 4th Generation wRfx: NONREACTIVE

## 2017-07-10 MED ORDER — LORAZEPAM 0.5 MG PO TABS: 0.5000 mg | ORAL_TABLET | Freq: Two times a day (BID) | ORAL | Status: DC | PRN

## 2017-07-10 MED ORDER — PRO-STAT SUGAR FREE PO LIQD
30.0000 mL | Freq: Two times a day (BID) | ORAL | Status: DC
  Administered 2017-07-10 (×2): 30 mL via ORAL
  Filled 2017-07-10 (×2): qty 30

## 2017-07-10 MED ORDER — BENZONATATE 100 MG PO CAPS
100.0000 mg | ORAL_CAPSULE | Freq: Three times a day (TID) | ORAL | Status: DC | PRN
  Administered 2017-07-11 – 2017-07-12 (×2): 100 mg via ORAL
  Filled 2017-07-10 (×2): qty 1

## 2017-07-10 MED ORDER — ALBUTEROL SULFATE (2.5 MG/3ML) 0.083% IN NEBU
2.5000 mg | INHALATION_SOLUTION | RESPIRATORY_TRACT | Status: DC | PRN
  Administered 2017-07-10 – 2017-07-12 (×2): 2.5 mg via RESPIRATORY_TRACT
  Filled 2017-07-10: qty 3

## 2017-07-10 MED ORDER — POTASSIUM CHLORIDE CRYS ER 20 MEQ PO TBCR
20.0000 meq | EXTENDED_RELEASE_TABLET | Freq: Once | ORAL | Status: AC
  Administered 2017-07-10: 20 meq via ORAL
  Filled 2017-07-10: qty 1

## 2017-07-10 NOTE — Care Management Note (Signed)
Case Management Note  Patient Details  Name: CHARDA JANIS MRN: [id number] Date of Birth: 08-03-1966  Subjective/Objective:    dyspnea                Action/Plan: Date:  July 10, 2017 Chart reviewed for concurrent status and case management needs. Will continue to follow patient progress. Discharge Planning: following for needs Expected discharge date: [id number] Marcelle Smiling, BSN, [place], [place]   [phone]  Expected Discharge Date:                  Expected Discharge Plan:  Home/Self Care  In-House Referral:     Discharge planning Services  CM Consult  Post Acute Care Choice:    Choice offered to:     DME Arranged:    DME Agency:     HH Arranged:    HH Agency:     Status of Service:  In process, will continue to follow  If discussed at Long Length of Stay Meetings, dates discussed:    Additional Comments:  Golda Acre, RN 07/10/2017, 8:39 AM

## 2017-07-10 NOTE — Progress Notes (Signed)
VASCULAR LAB PRELIMINARY  PRELIMINARY  PRELIMINARY  PRELIMINARY  Bilateral lower extremity venous duplex completed.    Preliminary report:  Technically difficult due to body habitus and respiratory interference. Bilateral:  No evidence of DVT, superficial thrombosis, or Baker's Cyst.   Armanie Ullmer, RVS 07/10/2017, 1:38 PM

## 2017-07-10 NOTE — Progress Notes (Signed)
Patient ID: Kathleen Rodriguez, female   DOB: 1966/05/21, 51 y.o.   MRN: [id number]                                                                PROGRESS NOTE                                                                                                                                                                                                             Patient Demographics:    Kathleen Rodriguez, is a 51 y.o. female, DOB - 11/25/1965, WUJ:[id number]  Admit date - 07/09/2017   Admitting Physician Eston Esters, MD  Outpatient Primary MD for the patient is [name], MD  LOS - 1  Outpatient Specialists:  No chief complaint on file. Asthma exacerbation     Brief Narrative  52 yo female with hx of asthma who came with cc of dyspnea for the past 3 days with wheezing and cough. Cough productive of clear sputum w/o fever/chills. She also reports substernal chest pain, constant w/o radiation and LE edema for over 3 days. No hx of VTE/recent travel and reports hx of HF but not on diuretics. No other complaints. She took prednisone 40 mg from left over meds yesterday and has been using albuterol for the last 3 days but w/o significant relief. She was placed on BiPAP in the ED.     Subjective:    Chaela Branscum today still has dyspnea.  Afebrile overnite.   No headache, No chest pain, No abdominal pain - No Nausea, No new weakness tingling or numbness,.    Assessment  & Plan :    Active Problems:   Asthma exacerbation   Asthma exacerbation:  Possibly due to URI/viral. cxr w/o opacity, will hold abx started in the ED Started on prednisone 50 mg daily=> solumedrol 9/15 Admit to stepdown and keep BiPAP at bedside if needed Continue solumedrol  iv q6h Cont dulera 2puff bid Cont albuterol  LE edema: possible diastolic HF but ProBNP wnl. Will check LE dupleux to r/o DVT.   Echo 9/15=> EF 60-65%  Chest pain: likely related to asthma exacerbation. Well's criteria w/low probability PE;  will check LE [place]. No hx of CAD. Initial EKG and trop unremarkable for ischemic event.   Hyperkalemia Kayexalate (9/15)  Hypokalemia Kdur 20 meq po x1 Check cmp in am     Code Status : FULL CODE  Family Communication  : w patient  Disposition Plan  : home  Barriers For Discharge :   Consults  :  none  Procedures  :  Cardiac echo 9/15   DVT Prophylaxis  :  Lovenox -SCDs   Lab Results  Component Value Date   PLT 281 07/10/2017    Antibiotics  :  Rocephin 9/15,  zithromax 9/15  Anti-infectives    Start     Dose/Rate Route Frequency Ordered Stop   07/09/17 1130  cefTRIAXone (ROCEPHIN) 1 g in dextrose 5 % 50 mL IVPB     1 g 100 mL/hr over 30 Minutes Intravenous  Once 07/09/17 1120 07/09/17 1226   07/09/17 1130  azithromycin (ZITHROMAX) 500 mg in dextrose 5 % 250 mL IVPB     500 mg 250 mL/hr over 60 Minutes Intravenous  Once 07/09/17 1121 07/09/17 1346        Objective:   Vitals:   07/10/17 0200 07/10/17 0325 07/10/17 0346 07/10/17 0400  BP: 132/68   (!) 149/81  Pulse: 92   88  Resp: 20   18  Temp:  (!) 97.4 F (36.3 C)    TempSrc:  Axillary    SpO2: 91%  92% 91%  Weight:      Height:        Wt Readings from Last 3 Encounters:  07/09/17 119.1 kg (262 lb 9.1 oz)  05/26/17 120.7 kg (266 lb)  05/26/17 120.6 kg (265 lb 12.8 oz)    No intake or output data in the 24 hours ending 07/10/17 0554   Physical Exam  Awake Alert, Oriented X 3, No new F.N deficits, Normal affect [place].AT,PERRAL Supple Neck,No JVD, No cervical lymphadenopathy appriciated.  Symmetrical Chest wall movement, Good air movement bilaterally, + bilateral wheezing RRR,No Gallops,Rubs or new Murmurs, No Parasternal Heave +ve B.Sounds, Abd Soft, No tenderness, No organomegaly appriciated, No rebound - guarding or rigidity. No Cyanosis, Clubbing or edema, No new Rash or bruise      Data Review:    CBC  Recent Labs Lab 07/09/17 1043 07/10/17 0323  WBC 9.8 9.0  HGB 12.0 11.0*    HCT 37.1 34.4*  PLT 298 281  MCV 82.1 81.3  MCH 26.5 26.0  MCHC 32.3 32.0  RDW 19.9* 19.9*  LYMPHSABS 2.5  --   MONOABS 1.4*  --   EOSABS 0.0  --   BASOSABS 0.0  --     Chemistries   Recent Labs Lab 07/09/17 1043 07/10/17 0323  NA 136 139  K 5.5* 3.3*  CL 103 103  CO2 26 28  GLUCOSE 121* 156*  BUN 8 12  CREATININE 0.95 0.74  CALCIUM 7.8* 8.2*  AST 54* 21  ALT 20 19  ALKPHOS 51 47  BILITOT 1.3* 0.3   ------------------------------------------------------------------------------------------------------------------ No results for input(s): CHOL, HDL, LDLCALC, TRIG, CHOLHDL, LDLDIRECT in the last 72 hours.  Lab Results  Component Value Date   HGBA1C 5.4 07/27/2016   ------------------------------------------------------------------------------------------------------------------ No results for input(s): TSH, T4TOTAL, T3FREE, THYROIDAB in the last 72 hours.  Invalid input(s): FREET3 ------------------------------------------------------------------------------------------------------------------ No results for input(s): VITAMINB12, FOLATE, FERRITIN, TIBC, IRON, RETICCTPCT in the last 72 hours.  Coagulation profile No results for input(s): INR, PROTIME in the last 168 hours.  No results for input(s): DDIMER in the last 72 hours.  Cardiac Enzymes  Recent Labs Lab 07/09/17 1043  TROPONINI <0.03   ------------------------------------------------------------------------------------------------------------------  Component Value Date/Time   BNP 27.2 07/09/2017 1043    Inpatient Medications  Scheduled Meds: . albuterol  2.5 mg Nebulization Q4H  . enoxaparin (LOVENOX) injection  40 mg Subcutaneous Q24H  . fluticasone  2 spray Each Nare Daily  . methylPREDNISolone (SOLU-MEDROL) injection  60 mg Intravenous Q6H  . mometasone-formoterol  2 puff Inhalation BID  . sodium polystyrene  15 g Oral Once   Continuous Infusions: PRN Meds:.acetaminophen,  dextromethorphan, methocarbamol  Micro Results Recent Results (from the past 240 hour(s))  MRSA PCR Screening     Status: None   Collection Time: 07/09/17  1:46 PM  Result Value Ref Range Status   MRSA by PCR NEGATIVE NEGATIVE Final    Comment:        The GeneXpert MRSA Assay (FDA approved for NASAL specimens only), is one component of a comprehensive MRSA colonization surveillance program. It is not intended to diagnose MRSA infection nor to guide or monitor treatment for MRSA infections.     Radiology Reports Dg Chest Port 1 View  Result Date: 07/09/2017 CLINICAL DATA:  Shortness of breath.  Chest pain. EXAM: PORTABLE CHEST 1 VIEW COMPARISON:  Mar 18, 2017 FINDINGS: The heart size and mediastinal contours are within normal limits. Both lungs are clear. The visualized skeletal structures are unremarkable. IMPRESSION: No active disease. 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

## 2017-07-11 DIAGNOSIS — D72829 Elevated white blood cell count, unspecified: Secondary | ICD-10-CM

## 2017-07-11 DIAGNOSIS — J209 Acute bronchitis, unspecified: Secondary | ICD-10-CM

## 2017-07-11 LAB — CBC
HEMATOCRIT: 35.3 % — AB (ref 36.0–46.0)
HEMOGLOBIN: 11.3 g/dL — AB (ref 12.0–15.0)
MCH: 26.7 pg (ref 26.0–34.0)
MCHC: 32 g/dL (ref 30.0–36.0)
MCV: 83.5 fL (ref 78.0–100.0)
Platelets: 312 10*3/uL (ref 150–400)
RBC: 4.23 MIL/uL (ref 3.87–5.11)
RDW: 20.4 % — ABNORMAL HIGH (ref 11.5–15.5)
WBC: 16.3 10*3/uL — ABNORMAL HIGH (ref 4.0–10.5)

## 2017-07-11 LAB — COMPREHENSIVE METABOLIC PANEL
ALBUMIN: 3 g/dL — AB (ref 3.5–5.0)
ALK PHOS: 49 U/L (ref 38–126)
ALT: 20 U/L (ref 14–54)
AST: 17 U/L (ref 15–41)
Anion gap: 8 (ref 5–15)
BILIRUBIN TOTAL: 0.2 mg/dL — AB (ref 0.3–1.2)
BUN: 21 mg/dL — AB (ref 6–20)
CALCIUM: 8.1 mg/dL — AB (ref 8.9–10.3)
CO2: 28 mmol/L (ref 22–32)
Chloride: 104 mmol/L (ref 101–111)
Creatinine, Ser: 0.81 mg/dL (ref 0.44–1.00)
GFR calc Af Amer: >60 mL/min (ref >60)
GFR calc non Af Amer: >60 mL/min (ref >60)
GLUCOSE: 122 mg/dL — AB (ref 65–99)
Potassium: 3.8 mmol/L (ref 3.5–5.1)
Sodium: 140 mmol/L (ref 135–145)
TOTAL PROTEIN: 5.9 g/dL — AB (ref 6.5–8.1)

## 2017-07-11 LAB — HEMOGLOBIN A1C
Hgb A1c MFr Bld: 5.7 % — ABNORMAL HIGH (ref 4.8–5.6)
Mean Plasma Glucose: 116.89 mg/dL

## 2017-07-11 MED ORDER — AZITHROMYCIN 250 MG PO TABS
250.0000 mg | ORAL_TABLET | Freq: Every day | ORAL | Status: DC
  Administered 2017-07-12: 250 mg via ORAL
  Filled 2017-07-11: qty 1

## 2017-07-11 MED ORDER — HYDRALAZINE HCL 20 MG/ML IJ SOLN
10.0000 mg | Freq: Four times a day (QID) | INTRAMUSCULAR | Status: DC | PRN
  Filled 2017-07-11: qty 1

## 2017-07-11 MED ORDER — ALBUTEROL SULFATE (2.5 MG/3ML) 0.083% IN NEBU
2.5000 mg | INHALATION_SOLUTION | Freq: Four times a day (QID) | RESPIRATORY_TRACT | Status: DC
  Administered 2017-07-11 – 2017-07-12 (×3): 2.5 mg via RESPIRATORY_TRACT
  Filled 2017-07-11 (×3): qty 3

## 2017-07-11 MED ORDER — METHYLPREDNISOLONE SODIUM SUCC 40 MG IJ SOLR
40.0000 mg | Freq: Three times a day (TID) | INTRAMUSCULAR | Status: DC
  Administered 2017-07-11 – 2017-07-12 (×4): 40 mg via INTRAVENOUS
  Filled 2017-07-11 (×4): qty 1

## 2017-07-11 MED ORDER — METRONIDAZOLE 500 MG PO TABS
500.0000 mg | ORAL_TABLET | Freq: Three times a day (TID) | ORAL | Status: DC
  Administered 2017-07-11 – 2017-07-12 (×5): 500 mg via ORAL
  Filled 2017-07-11 (×5): qty 1

## 2017-07-11 MED ORDER — AZITHROMYCIN 250 MG PO TABS
500.0000 mg | ORAL_TABLET | Freq: Every day | ORAL | Status: AC
  Administered 2017-07-11: 500 mg via ORAL
  Filled 2017-07-11: qty 2

## 2017-07-11 NOTE — Progress Notes (Signed)
Patient ID: GLENA PHARRIS, female   DOB: 1966/03/17, 51 y.o.   MRN: [id number]                                                                PROGRESS NOTE                                                                                                                                                                                                             Patient Demographics:    Treasa Bradshaw, is a 51 y.o. female, DOB - [birth year], WUJ:[id number]  Admit date - 07/09/2017   Admitting Physician Eston Esters, MD  Outpatient Primary MD for the patient is [name], MD  LOS - 2  Outpatient Specialists:     No chief complaint on file.    dyspnea  Brief Narrative  51 yo female with hx of asthma who came with cc of dyspnea for the past 3 days with wheezing and cough. Cough productive of clear sputum w/o fever/chills. She also reports substernal chest pain, constant w/o radiation and LE edema for over 3 days. No hx of VTE/recent travel and reports hx of HF but not on diuretics. No other complaints. She took prednisone 40 mg from left over meds yesterday and has been using albuterol for the last 3 days but w/o significant relief. She was placed on BiPAP in the ED.     Subjective:    [place] Mas[place] today has slight cough, white sputum. Afebrile.   Iv didn't work yesterday, infiltrated, pt feels breathing improving.  Pt concerned that she might have trichomonas.    No headache, No chest pain, No abdominal pain - No Nausea, No new weakness tingling or numbness   Assessment  & Plan :    Active Problems:   Asthma exacerbation   Hypokalemia   Asthma exacerbation:  Possibly due to URI/viral. cxr w/o opacity, will hold abx started in the ED Started on prednisone 50 mg daily=> solumedrol 9/15 Admit to stepdown and keep BiPAP at bedside if needed Decrease solumedrol from  iv q6h to  iv q8h Cont dulera 2puff bid Cont albuterol  Leukocytosis likely secondary to  solumedrol/Bronchitis Add zithromax for bronchitis  LE edema: possible diastolic HF but ProBNP wnl. Will LE duplex (9/17) negative DVT.  Echo 9/15=> EF  60-65%   Chest pain : likely related to asthma exacerbation. Well's criteria w/low probability PE; will check LE [place]. No hx of CAD. Initial EKG and trop unremarkable for ischemic event.   Hyperkalemia resolved Kayexalate (9/15)   Hypokalemia resolved Kdur 20 meq po x1 (9/17)  Check bmp in am  ?Trichomonas Flagyl  po tid x 1 week    Code Status : FULL CODE  Family Communication  : w patient  Disposition Plan  : home  Barriers For Discharge :   Consults  :  none  Procedures  :  Cardiac echo 9/15   DVT Prophylaxis  :  Lovenox -SCDs     Lab Results  Component Value Date   PLT 312 07/11/2017    Antibiotics  :  zthromax 9/18=>  Anti-infectives    Start     Dose/Rate Route Frequency Ordered Stop   07/12/17 1000  azithromycin (ZITHROMAX) tablet 250 mg     250 mg Oral Daily 07/11/17 0607 07/16/17 0959   07/11/17 1000  azithromycin (ZITHROMAX) tablet 500 mg     500 mg Oral Daily 07/11/17 0607 07/12/17 0959   07/09/17 1130  cefTRIAXone (ROCEPHIN) 1 g in dextrose 5 % 50 mL IVPB     1 g 100 mL/hr over 30 Minutes Intravenous  Once 07/09/17 1120 07/09/17 1226   07/09/17 1130  azithromycin (ZITHROMAX) 500 mg in dextrose 5 % 250 mL IVPB     500 mg 250 mL/hr over 60 Minutes Intravenous  Once 07/09/17 1121 07/09/17 1346        Objective:   Vitals:   07/11/17 0200 07/11/17 0300 07/11/17 0400 07/11/17 0500  BP:      Pulse: 100 85 89 89  Resp: (!) Temp:  (!) 97.2 F (36.2 C)    TempSrc:  Axillary    SpO2: (!) 89% 94% 94% 92%  Weight:      Height:        Wt Readings from Last 3 Encounters:  07/09/17 119.1 kg (262 lb 9.1 oz)  05/26/17 120.7 kg (266 lb)  05/26/17 120.6 kg (265 lb 12.8 oz)     Intake/Output Summary (Last 24 hours) at 07/11/17 0607 Last data filed at 07/10/17  0730  Gross per 24 hour  Intake                0 ml  Output              400 ml  Net             -400 ml     Physical Exam  Awake Alert, Oriented X 3, No new F.N deficits, Normal affect [place].AT,PERRAL Supple Neck,No JVD, No cervical lymphadenopathy appriciated.  Symmetrical Chest wall movement, Better air movement bilaterally, slight exp wheezing in bilateral lung fields, no crackles RRR,No Gallops,Rubs or new Murmurs, No Parasternal Heave +ve B.Sounds, Abd Soft, No tenderness, No organomegaly appriciated, No rebound - guarding or rigidity. No Cyanosis, Clubbing or edema, No new Rash or bruise     Data Review:    CBC  Recent Labs Lab 07/09/17 1043 07/10/17 0323 07/11/17 0318  WBC 9.8 9.0 16.3*  HGB 12.0 11.0* 11.3*  HCT 37.1 34.4* 35.3*  PLT 298 281 312  MCV 82.1 81.3 83.5  MCH 26.5 26.0 26.7  MCHC 32.3 32.0 32.0  RDW 19.9* 19.9* 20.4*  LYMPHSABS 2.5  --   --   MONOABS 1.4*  --   --  EOSABS 0.0  --   --   BASOSABS 0.0  --   --     Chemistries   Recent Labs Lab 07/09/17 1043 07/10/17 0323 07/11/17 0318  NA 136 139 140  K 5.5* 3.3* 3.8  CL 103 103 104  CO2 GLUCOSE 121* 156* 122*  BUN 8 12 21*  CREATININE 0.95 0.74 0.81  CALCIUM 7.8* 8.2* 8.1*  AST 54* 21 17  ALT ALKPHOS 51 47 49  BILITOT 1.3* 0.3 0.2*   ------------------------------------------------------------------------------------------------------------------ No results for input(s): CHOL, HDL, LDLCALC, TRIG, CHOLHDL, LDLDIRECT in the last 72 hours.  Lab Results  Component Value Date   HGBA1C 5.4 07/27/2016   ------------------------------------------------------------------------------------------------------------------ No results for input(s): TSH, T4TOTAL, T3FREE, THYROIDAB in the last 72 hours.  Invalid input(s): FREET3 ------------------------------------------------------------------------------------------------------------------ No results for input(s):  VITAMINB12, FOLATE, FERRITIN, TIBC, IRON, RETICCTPCT in the last 72 hours.  Coagulation profile No results for input(s): INR, PROTIME in the last 168 hours.  No results for input(s): DDIMER in the last 72 hours.  Cardiac Enzymes  Recent Labs Lab 07/09/17 1043  TROPONINI <0.03   ------------------------------------------------------------------------------------------------------------------    Component Value Date/Time   BNP 27.2 07/09/2017 1043    Inpatient Medications  Scheduled Meds: . albuterol  2.5 mg Nebulization Q4H  . azithromycin  500 mg Oral Daily   Followed by  . [START ON 07/12/2017] azithromycin  250 mg Oral Daily  . enoxaparin (LOVENOX) injection  40 mg Subcutaneous Q24H  . feeding supplement (PRO-STAT SUGAR FREE 64)  30 mL Oral BID  . fluticasone  2 spray Each Nare Daily  . methylPREDNISolone (SOLU-MEDROL) injection  40 mg Intravenous Q8H  . mometasone-formoterol  2 puff Inhalation BID   Continuous Infusions: PRN Meds:.acetaminophen, albuterol, benzonatate, dextromethorphan, LORazepam, methocarbamol  Micro Results Recent Results (from the past 240 hour(s))  MRSA PCR Screening     Status: None   Collection Time: 07/09/17  1:46 PM  Result Value Ref Range Status   MRSA by PCR NEGATIVE NEGATIVE Final    Comment:        The GeneXpert MRSA Assay (FDA approved for NASAL specimens only), is one component of a comprehensive MRSA colonization surveillance program. It is not intended to diagnose MRSA infection nor to guide or monitor treatment for MRSA infections.     Radiology Reports Dg Chest Port 1 View  Result Date: 07/09/2017 CLINICAL DATA:  Shortness of breath.  Chest pain. EXAM: PORTABLE CHEST 1 VIEW COMPARISON:  Mar 18, 2017 FINDINGS: The heart size and mediastinal contours are within normal limits. Both lungs are clear. The visualized skeletal structures are unremarkable. IMPRESSION: No active disease. 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

## 2017-07-12 DIAGNOSIS — J4541 Moderate persistent asthma with (acute) exacerbation: Principal | ICD-10-CM

## 2017-07-12 LAB — BASIC METABOLIC PANEL
Anion gap: 8 (ref 5–15)
BUN: 21 mg/dL — AB (ref 6–20)
CHLORIDE: 106 mmol/L (ref 101–111)
CO2: 28 mmol/L (ref 22–32)
Calcium: 8.3 mg/dL — ABNORMAL LOW (ref 8.9–10.3)
Creatinine, Ser: 0.84 mg/dL (ref 0.44–1.00)
GFR calc Af Amer: >60 mL/min (ref >60)
GFR calc non Af Amer: >60 mL/min (ref >60)
GLUCOSE: 125 mg/dL — AB (ref 65–99)
POTASSIUM: 4 mmol/L (ref 3.5–5.1)
Sodium: 142 mmol/L (ref 135–145)

## 2017-07-12 LAB — CBC
HEMATOCRIT: 35.6 % — AB (ref 36.0–46.0)
Hemoglobin: 11.5 g/dL — ABNORMAL LOW (ref 12.0–15.0)
MCH: 26.7 pg (ref 26.0–34.0)
MCHC: 32.3 g/dL (ref 30.0–36.0)
MCV: 82.6 fL (ref 78.0–100.0)
Platelets: 312 10*3/uL (ref 150–400)
RBC: 4.31 MIL/uL (ref 3.87–5.11)
RDW: 20.1 % — AB (ref 11.5–15.5)
WBC: 13.8 10*3/uL — ABNORMAL HIGH (ref 4.0–10.5)

## 2017-07-12 MED ORDER — METHOCARBAMOL 500 MG PO TABS: 500.0000 mg | ORAL_TABLET | Freq: Every day | ORAL | 0 refills | Status: DC | PRN

## 2017-07-12 MED ORDER — DEXTROMETHORPHAN POLISTIREX ER 30 MG/5ML PO SUER: 30.0000 mg | Freq: Two times a day (BID) | ORAL | 0 refills | Status: DC | PRN

## 2017-07-12 MED ORDER — METRONIDAZOLE 500 MG PO TABS: 500.0000 mg | ORAL_TABLET | Freq: Three times a day (TID) | ORAL | 0 refills | Status: AC

## 2017-07-12 MED ORDER — METRONIDAZOLE 500 MG PO TABS: 500.0000 mg | ORAL_TABLET | Freq: Three times a day (TID) | ORAL | 0 refills | Status: DC

## 2017-07-12 MED ORDER — BENZONATATE 100 MG PO CAPS: 100.0000 mg | ORAL_CAPSULE | Freq: Three times a day (TID) | ORAL | 0 refills | Status: DC | PRN

## 2017-07-12 MED ORDER — TRANEXAMIC ACID 1000 MG/10ML IV SOLN
1000.0000 mg | INTRAVENOUS | Status: DC
  Filled 2017-07-12: qty 10

## 2017-07-12 MED ORDER — PREDNISONE 10 MG PO TABS
ORAL_TABLET | ORAL | 0 refills | Status: DC
Start: 2017-07-12 — End: 2017-07-25

## 2017-07-12 MED ORDER — AZITHROMYCIN 250 MG PO TABS: 250.0000 mg | ORAL_TABLET | Freq: Every day | ORAL | 0 refills | Status: AC

## 2017-07-12 MED ORDER — DEXTROSE 5 % IV SOLN
3.0000 g | INTRAVENOUS | Status: DC
  Filled 2017-07-12: qty 3000

## 2017-07-12 MED ORDER — PREDNISONE 10 MG PO TABS: ORAL_TABLET | ORAL | 0 refills | Status: DC

## 2017-07-12 MED ORDER — AZITHROMYCIN 250 MG PO TABS: 250.0000 mg | ORAL_TABLET | Freq: Every day | ORAL | 0 refills | Status: DC

## 2017-07-12 MED FILL — ?PREDNISONE 10 MG TABLET: 10 | 8 days supply | Qty: 20 | Fill #0

## 2017-07-12 MED FILL — AZITHROMYCIN 250 MG TABLET: 250 | 5 days supply | Qty: 5 | Fill #0

## 2017-07-12 MED FILL — metroNIDAZOLE 500 MG TABS: 500 | 6 days supply | Qty: 18 | Fill #0

## 2017-07-12 MED FILL — BENZONATATE 100 MG CAPSULE: 100 | 6 days supply | Qty: 20 | Fill #0

## 2017-07-12 NOTE — Discharge Summary (Signed)
Physician Discharge Summary  Kathleen Rodriguez:[id number] DOB: [birth year] DOA: 07/09/2017  PCP: [name], MD  Admit date: 07/09/2017 Discharge date: 07/12/2017  Admitted From: Home Disposition:  Home  Recommendations for Outpatient Follow-up:  1. Follow up with PCP in 1-2 weeks 2. Please obtain BMP/CBC in one week  Home Health:No Equipment/Devices:None  Discharge Condition:Stable CODE STATUS:Full Diet recommendation: Heart Healthy  Brief/Interim Summary: This is a 51 yo female with hx of asthma who came with cc of dyspnea for the past 3 days with wheezing and cough.  She was initially placed on BiPAP in the ED And was admitted with asthma exacerbation. She was also noted to have acute bronchitis with associated leukocytosis. She was maintained on breathing treatments as well as IV Solu-Medrol. She was also started on Zithromax for her bronchitis and underwent several days of treatment. She was also noted to have a recurrent history of dysuria with prior Trichomonas for which Flagyl was started. She is currently on room air with oxygen saturation in the high and 90th percentile and was able to ambulate with nursing staff with adequate oxygen saturations noted on room air. She states that her chest tightness and dyspnea has improved significantly and she will be discharged with Zithromax for 5 more days as well as oral prednisone taper. She'll continue with her rescue inhaler as needed as well as dulera twice daily. She has also been given some refills on her muscle relaxer as she has been having some low back pain.  Discharge Diagnoses:  Active Problems:   Asthma exacerbation   Hypokalemia   Acute bronchitis   Leukocytosis    Discharge Instructions  Discharge Instructions    Diet - low sodium heart healthy    Complete by:  As directed    Increase activity slowly    Complete by:  As directed      Allergies as of 07/12/2017      Reactions   Tomato Hives, Itching, Other (See  Comments)   ALSO REACTS TO KETCHUP   Latex Itching, Rash   Wool Alcohol [lanolin] Itching      Medication List    TAKE these medications   acetaminophen 500 MG tablet Commonly known as:  TYLENOL Take 1,000 mg by mouth every 6 (six) hours as needed for mild pain.   albuterol 108 (90 Base) MCG/ACT inhaler Commonly known as:  VENTOLIN HFA Inhale 2 puffs into the lungs every 6 (six) hours as needed for wheezing. Notes to patient:  Last dose given today at noon.   azithromycin 250 MG tablet Commonly known as:  ZITHROMAX Take 1 tablet (250 mg total) by mouth daily.   benzonatate 100 MG capsule Commonly known as:  TESSALON Take 1 capsule (100 mg total) by mouth 3 (three) times daily as needed for cough. Notes to patient:  Last dose given this morning. You had one dose today.   dextromethorphan 30 MG/5ML liquid Commonly known as:  DELSYM Take 5 mLs (30 mg total) by mouth 2 (two) times daily as needed for cough. Notes to patient:  Last dose given yesterday   fluticasone 50 MCG/ACT nasal spray Commonly known as:  FLONASE Place 2 sprays into both nostrils daily.   methocarbamol 500 MG tablet Commonly known as:  ROBAXIN Take 1 tablet (500 mg total) by mouth daily as needed for muscle spasms. Notes to patient:  You received a dose this morning   metroNIDAZOLE 500 MG tablet Commonly known as:  FLAGYL Take 1 tablet (500 mg  total) by mouth every 8 (eight) hours.   mometasone-formoterol 200-5 MCG/ACT Aero Commonly known as:  DULERA Inhale 2 puffs into the lungs 2 (two) times daily.   predniSONE 10 MG tablet Commonly known as:  DELTASONE Take 4 tablets for 2 days, 3 tablets for 2 days, 2 tablets for 2 days, 1 tablet for 2 days   triamcinolone cream 0.1 % Commonly known as:  KENALOG Apply 1 application topically 2 (two) times daily.            Discharge Care Instructions        Start     Ordered   07/12/17 0000  azithromycin (ZITHROMAX) 250 MG tablet  Daily      07/12/17 1436   07/12/17 0000  benzonatate (TESSALON) 100 MG capsule  3 times daily PRN     07/12/17 1436   07/12/17 0000  dextromethorphan (DELSYM) 30 MG/5ML liquid  2 times daily PRN     07/12/17 1436   07/12/17 0000  methocarbamol (ROBAXIN) 500 MG tablet  Daily PRN     07/12/17 1436   07/12/17 0000  metroNIDAZOLE (FLAGYL) 500 MG tablet  Every 8 hours     07/12/17 1436   07/12/17 0000  predniSONE (DELTASONE) 10 MG tablet     07/12/17 1436   07/12/17 0000  Diet - low sodium heart healthy     07/12/17 1436   07/12/17 0000  Increase activity slowly     07/12/17 1436     Follow-up Information    [name], MD Follow up in 1 week(s).   Specialty:  Family Medicine Contact information: [address] [place] [address] [phone]          Allergies  Allergen Reactions  . Tomato Hives, Itching and Other (See Comments)    ALSO REACTS TO KETCHUP  . Latex Itching and Rash  . Wool Alcohol [Lanolin] Itching    Consultations:  None   Procedures/Studies: Dg Chest Port 1 View  Result Date: 07/09/2017 CLINICAL DATA:  Shortness of breath.  Chest pain. EXAM: PORTABLE CHEST 1 VIEW COMPARISON:  Mar 18, 2017 FINDINGS: The heart size and mediastinal contours are within normal limits. Both lungs are clear. The visualized skeletal structures are unremarkable. IMPRESSION: No active disease. 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   (Echo, Carotid, EGD, Colonoscopy, ERCP)    Subjective:   Discharge Exam: Vitals:   07/12/17 1100 07/12/17 1155  BP:    Pulse:    Resp:    Temp:    SpO2: 94% 96%   Vitals:   07/12/17 1026 07/12/17 1028 07/12/17 1100 07/12/17 1155  BP:      Pulse:      Resp:      Temp:      TempSrc:       SpO2: 97% 97% 94% 96%  Weight:      Height:        General: Pt is alert, awake, not in acute distress Cardiovascular: RRR, S1/S2 +, no rubs, no gallops Respiratory: CTA bilaterally, no wheezing, no rhonchi Abdominal: Soft, NT, ND, bowel sounds + Extremities: no edema, no cyanosis    The results of significant diagnostics from this hospitalization (including imaging, microbiology, ancillary and  laboratory) are listed below for reference.     Microbiology: Recent Results (from the past 240 hour(s))  MRSA PCR Screening     Status: None   Collection Time: 07/09/17  1:46 PM  Result Value Ref Range Status   MRSA by PCR NEGATIVE NEGATIVE Final    Comment:        The GeneXpert MRSA Assay (FDA approved for NASAL specimens only), is one component of a comprehensive MRSA colonization surveillance program. It is not intended to diagnose MRSA infection nor to guide or monitor treatment for MRSA infections.      Labs: BNP (last 3 results)  Recent Labs  07/09/17 1043  BNP 27.2   Basic Metabolic Panel:  Recent Labs Lab 07/09/17 1043 07/10/17 0323 07/11/17 0318 07/12/17 0434  NA 136 139 140 142  K 5.5* 3.3* 3.8 4.0  CL 103 103 104 106  CO2 GLUCOSE 121* 156* 122* 125*  BUN 8 12 21* 21*  CREATININE 0.95 0.74 0.81 0.84  CALCIUM 7.8* 8.2* 8.1* 8.3*   Liver Function Tests:  Recent Labs Lab 07/09/17 1043 07/10/17 0323 07/11/17 0318  AST 54* 21 17  ALT ALKPHOS 51 47 49  BILITOT 1.3* 0.3 0.2*  PROT 6.4* 6.2* 5.9*  ALBUMIN 3.4* 3.0* 3.0*   No results for input(s): LIPASE, AMYLASE in the last 168 hours. No results for input(s): AMMONIA in the last 168 hours. CBC:  Recent Labs Lab 07/09/17 1043 07/10/17 0323 07/11/17 0318 07/12/17 0434  WBC 9.8 9.0 16.3* 13.8*  NEUTROABS 5.9  --   --   --   HGB 12.0 11.0* 11.3* 11.5*  HCT 37.1 34.4* 35.3* 35.6*  MCV 82.1 81.3 83.5 82.6  PLT 298 281 312 312   Cardiac Enzymes:  Recent  Labs Lab 07/09/17 1043  TROPONINI <0.03   BNP: Invalid input(s): POCBNP CBG: No results for input(s): GLUCAP in the last 168 hours. D-Dimer No results for input(s): DDIMER in the last 72 hours. Hgb A1c  Recent Labs  07/11/17 0318  HGBA1C 5.7*   Lipid Profile No results for input(s): CHOL, HDL, LDLCALC, TRIG, CHOLHDL, LDLDIRECT in the last 72 hours. Thyroid function studies No results for input(s): TSH, T4TOTAL, T3FREE, THYROIDAB in the last 72 hours.  Invalid input(s): FREET3 Anemia work up No results for input(s): VITAMINB12, FOLATE, FERRITIN, TIBC, IRON, RETICCTPCT in the last 72 hours. Urinalysis    Component Value Date/Time   COLORURINE YELLOW 07/09/2017 1453   APPEARANCEUR TURBID (A) 07/09/2017 1453   LABSPEC >1.030 (H) 07/09/2017 1453   PHURINE 5.5 07/09/2017 1453   GLUCOSEU NEGATIVE 07/09/2017 1453   HGBUR SMALL (A) 07/09/2017 1453   BILIRUBINUR SMALL (A) 07/09/2017 1453   BILIRUBINUR small 05/26/2017 1539   KETONESUR 15 (A) 07/09/2017 1453   PROTEINUR TRACE (A) 07/09/2017 1453   UROBILINOGEN 1.0 05/26/2017 1539   UROBILINOGEN 1.0 03/22/2014 0805   NITRITE NEGATIVE 07/09/2017 1453   LEUKOCYTESUR NEGATIVE 07/09/2017 1453   Sepsis Labs Invalid input(s): PROCALCITONIN,  WBC,  LACTICIDVEN Microbiology Recent Results (from the past 240 hour(s))  MRSA PCR Screening     Status: None   Collection Time: 07/09/17  1:46 PM  Result Value Ref Range Status   MRSA by PCR NEGATIVE NEGATIVE Final    Comment:        The GeneXpert MRSA Assay (FDA approved for NASAL specimens only), is one component of a comprehensive MRSA colonization surveillance program. It is not intended to diagnose  MRSA infection nor to guide or monitor treatment for MRSA infections.      Time coordinating discharge: Over 30 minutes  SIGNED:   Erick Blinks, MD  [facility] 07/12/2017, 6:57 PM Pager   If 7PM-7AM, please contact night-coverage [email or web address] Password [email or web address]

## 2017-07-13 ENCOUNTER — Inpatient Hospital Stay (HOSPITAL_COMMUNITY): Admission: RE | Admit: 2017-07-13 | Payer: Self-pay | Source: Ambulatory Visit | Admitting: Orthopaedic Surgery

## 2017-07-13 ENCOUNTER — Encounter (HOSPITAL_COMMUNITY): Admission: RE | Payer: Self-pay | Source: Ambulatory Visit

## 2017-07-13 SURGERY — ARTHROPLASTY, KNEE, TOTAL
Anesthesia: Spinal | Site: Knee | Laterality: Right

## 2017-07-13 NOTE — Progress Notes (Signed)
CSW notified by [facility] and patient's RN that patient needed assistance with transportation. Patient reported that she had no family/friends to provide transportation nor money to pay for taxi. CSW inquired about bus route near patient's home, patient reported none. CSW provided patient with taxi voucher, patient thanked CSW.   Celso Sickle, [place] Clinical Social Worker [facility] Cell#: [phone]

## 2017-07-14 ENCOUNTER — Telehealth: Payer: Self-pay | Admitting: Pulmonary Disease

## 2017-07-14 NOTE — Telephone Encounter (Signed)
lmtcb x1 for pt. 

## 2017-07-18 NOTE — Telephone Encounter (Signed)
Called and spoke to pt. Pt c/o SOB, cough, chest congestion. Pt is requesting an appt on 07/21/17. Appt made with TP on 9/28. Pt verbalized understanding and denied any further questions or concerns at this time.

## 2017-07-21 ENCOUNTER — Ambulatory Visit: Payer: Medicaid Other | Admitting: Adult Health

## 2017-07-24 ENCOUNTER — Inpatient Hospital Stay: Payer: Medicaid Other

## 2017-07-25 ENCOUNTER — Ambulatory Visit (INDEPENDENT_AMBULATORY_CARE_PROVIDER_SITE_OTHER): Payer: Self-pay | Admitting: Adult Health

## 2017-07-25 ENCOUNTER — Encounter: Payer: Self-pay | Admitting: Adult Health

## 2017-07-25 DIAGNOSIS — J45998 Other asthma: Secondary | ICD-10-CM

## 2017-07-25 DIAGNOSIS — G4733 Obstructive sleep apnea (adult) (pediatric): Secondary | ICD-10-CM

## 2017-07-25 MED ORDER — TIOTROPIUM BROMIDE MONOHYDRATE 2.5 MCG/ACT IN AERS: 2.0000 | INHALATION_SPRAY | Freq: Every day | RESPIRATORY_TRACT | 3 refills | Status: DC

## 2017-07-25 MED ORDER — TIOTROPIUM BROMIDE MONOHYDRATE 2.5 MCG/ACT IN AERS: 2.0000 | INHALATION_SPRAY | Freq: Every day | RESPIRATORY_TRACT | 0 refills | Status: DC

## 2017-07-25 NOTE — Addendum Note (Signed)
Addended by: Boone Master E on: 07/25/2017 05:13 PM   Modules accepted: Orders

## 2017-07-25 NOTE — Progress Notes (Signed)
@Patient  ID: [name], female    DOB: 22-Oct-1966, 51 y.o.   MRN: [id number]  Chief Complaint  Patient presents with  . Acute Visit    Asthma    Referring provider: Arnoldo Morale, MD  HPI: 51 yo female former smoker with moderate obstructive asthma , Chronic cough , OSA    Pulmonary tests CT chest4/15/14 >>no PE, no acute findings Labs 02/11/13 >> ANA negative, Anti-GBM Ab < 1, ANCA negative PFT 05/15/13>>FEV1 1.90 (76%), FEV1% 72, TLC 4.23 (81%), DLCO 89%, no BD PFT 03/03/17 >> FEV1 1.57 (64%), FEV1% 64, TLC 4.47 (84%), DLCO 685, + BD  Sleep tests PSG 04/19/13 >> AHI 16.4, SpO2 low 84%.  Cardiac tests Echo 04/06/14 >> mod LVH, EF 65 to 70%  07/25/2017  Follow up ; Asthma  Patient returns for a two-month follow-up for asthma. Since last visit. Patient was admitted 2 weeks ago for asthma exacerbation. She did initially require BiPAP support in the emergency room. She was treated with IV antibiotics, steroids and nebulized bronchodilators.. Since discharge. Patient says she is feeling some better but still has some lingering cough and sob. Has finished prednisone taper.  Patient supposed to be taking , Dulera twice daily. Says she takes once daily . We discussed taking Twice daily  . Has drainage in throat at times.   Patient has no insurance, depends on samples and [employer] .  Patient is on C Pap for sleep apnea. Admits not wearing each night .  Advised on compliance.     Allergies  Allergen Reactions  . Tomato Hives, Itching and Other (See Comments)    ALSO REACTS TO KETCHUP  . Latex Itching and Rash  . Wool Alcohol [Lanolin] Itching    Immunization History  Administered Date(s) Administered  . Influenza,inj,Quad PF,6+ Mos 08/13/2013, 07/27/2015, 07/21/2016  . Tdap 08/14/2013    Past Medical History:  Diagnosis Date  . Arthritis    "knees, lower back; legs, ankles" (01/27/2016)  . Asthma    followed by Dr. Halford Chessman  . CHF (congestive heart  failure) ([place]) 2016   "when I went into a coma"  . Cocaine abuse ([place])   . 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 Dyspnea   . GERD (gastroesophageal reflux disease)   . Hypertension    "doctor took me off RX in 2016" (01/27/2016)  . Influenza B April 3419   Complicated by multi-organ failure  . OSA on CPAP "since " 03/20/2013  . Pneumonia 2016  . Required emergent intubation    asthma exacerbation in 2016  . Tobacco abuse   . Upper airway cough syndrome     Tobacco History: History  Smoking Status  . Former Smoker  . Packs/day: 0.25  . Years: 20.00  . Types: Cigarettes  . Quit date: 01/31/2013  Smokeless Tobacco  . Never Used   Counseling given: Not Answered   Outpatient Encounter Prescriptions as of 07/25/2017  Medication Sig  . acetaminophen (TYLENOL) 500 MG tablet Take 1,000 mg by mouth every 6 (six) hours as needed for mild pain.  Marland Kitchen albuterol (VENTOLIN HFA) 108 (90 Base) MCG/ACT inhaler Inhale 2 puffs into the lungs every 6 (six) hours as needed for wheezing.  . benzonatate (TESSALON) 100 MG capsule Take 1 capsule (100 mg total) by mouth 3 (three) times daily as needed for cough.  . dextromethorphan (DELSYM) 30 MG/5ML liquid Take 5 mLs (30 mg total) by mouth 2 (two) times daily as needed for cough.  Marland Kitchen  fluticasone (FLONASE) 50 MCG/ACT nasal spray Place 2 sprays into both nostrils daily.  . methocarbamol (ROBAXIN) 500 MG tablet Take 1 tablet (500 mg total) by mouth daily as needed for muscle spasms.  . mometasone-formoterol (DULERA) 200-5 MCG/ACT AERO Inhale 2 puffs into the lungs 2 (two) times daily.  Marland Kitchen triamcinolone cream (KENALOG) 0.1 % Apply 1 application topically 2 (two) times daily.  . [DISCONTINUED] predniSONE (DELTASONE) 10 MG tablet Take 4 tablets for 2 days, 3 tablets for 2 days, 2 tablets for 2 days, 1 tablet for 2 days (Patient not taking: Reported on 07/25/2017)   No facility-administered encounter medications on file as of 07/25/2017.      Review of  Systems  Constitutional:   No  weight loss, night sweats,  Fevers, chills, fatigue, or  lassitude.  HEENT:   No headaches,  Difficulty swallowing,  Tooth/dental problems, or  Sore throat,                No sneezing, itching, ear ache, + nasal congestion, post nasal drip,   CV:  No chest pain,  Orthopnea, PND, swelling in lower extremities, anasarca, dizziness, palpitations, syncope.   GI  No heartburn, indigestion, abdominal pain, nausea, vomiting, diarrhea, change in bowel habits, loss of appetite, bloody stools.   Resp:    No chest wall deformity  Skin: no rash or lesions.  GU: no dysuria, change in color of urine, no urgency or frequency.  No flank pain, no hematuria   MS:  No joint pain or swelling.  No decreased range of motion.  No back pain.    Physical Exam  BP 128/76 (BP Location: Right Arm, Cuff Size: Normal)   Pulse 99   Ht 5\' 5"  (1.651 m)   Wt 266 lb (120.7 kg)   SpO2 97%   BMI 44.26 kg/m   GEN: A/Ox3; pleasant , NAD, well nourished    HEENT:  [place]/AT,  EACs-clear, TMs-wnl, NOSE-clear, THROAT-clear, no lesions, no postnasal drip or exudate noted.   NECK:  Supple w/ fair ROM; no JVD; normal carotid impulses w/o bruits; no thyromegaly or nodules palpated; no lymphadenopathy.    RESP  Clear  P & A; w/o, wheezes/ rales/ or rhonchi. no accessory muscle use, no dullness to percussion  CARD:  RRR, no m/r/g, no peripheral edema, pulses intact, no cyanosis or clubbing.  GI:   Soft & nt; nml bowel sounds; no organomegaly or masses detected.   Musco: Warm bil, no deformities or joint swelling noted.   Neuro: alert, no focal deficits noted.    Skin: Warm, no lesions or rashes    BMET BNP Imaging: Dg Chest Port 1 View  Result Date: 07/09/2017 CLINICAL DATA:  Shortness of breath.  Chest pain. EXAM: PORTABLE CHEST 1 VIEW COMPARISON:  Mar 18, 2017 FINDINGS: The heart size and mediastinal contours are within normal limits. Both lungs are clear. The visualized  skeletal structures are unremarkable. IMPRESSION: No active disease. Electronically Signed   By: Dorise Bullion III M.D   On: 07/09/2017 10:53     Assessment & Plan:   Uncontrolled persistent asthma AB/COPD with frequent exacerbations .  No medical insurance is major barrier.  She does get medical care and prescriptions through community health and wellness.  Advised to increase Dulera Twice daily   Add Spriiva daily  Check IgE /RAST and CBC w/ diff.   Plan  Patient Instructions  Continue on Dulera 200 2 puffs Twice daily  , rinse after use.  Labs  today .  Begin Spiriva 2 puffs daily .  Begin Zyrtec 10 mg at bedtime. Mucinex DM twice daily as needed for cough and congestion. Use albuterol nebulizer as rescue only. For wheezing and shortness of breath. Continue on CPAP at bedtime. Try to wear each night .  Goal is to wear at least 6 hours each night Work on weight loss Followup with Dr. Halford Chessman in 2 months and As needed  .  Please contact office for sooner follow up if symptoms do not improve or worsen or seek emergency care      OSA (obstructive sleep apnea) Advised on CPAP compliance   Plan  Wear CPAP At bedtime  .  Wt loss.       [name], NP 07/25/2017

## 2017-07-25 NOTE — Progress Notes (Signed)
I have reviewed and agree with assessment/plan.  [name], MD [facility] Pulmonary/Critical [facility] 07/25/2017, 5:30 PM Pager:  [phone]

## 2017-07-25 NOTE — Assessment & Plan Note (Signed)
Advised on CPAP compliance   Plan  Wear CPAP At bedtime  .  Wt loss.

## 2017-07-25 NOTE — Assessment & Plan Note (Signed)
AB/COPD with frequent exacerbations .  No medical insurance is major barrier.  She does get medical care and prescriptions through community health and wellness.  Advised to increase Dulera Twice daily   Add Spriiva daily  Check IgE /RAST and CBC w/ diff.   Plan  Patient Instructions  Continue on Dulera 200 2 puffs Twice daily  , rinse after use.  Labs today .  Begin Spiriva 2 puffs daily .  Begin Zyrtec 10 mg at bedtime. Mucinex DM twice daily as needed for cough and congestion. Use albuterol nebulizer as rescue only. For wheezing and shortness of breath. Continue on CPAP at bedtime. Try to wear each night .  Goal is to wear at least 6 hours each night Work on weight loss Followup with Dr. Craige Cotta in 2 months and As needed  .  Please contact office for sooner follow up if symptoms do not improve or worsen or seek emergency care

## 2017-07-25 NOTE — Patient Instructions (Addendum)
Continue on Dulera 200 2 puffs Twice daily  , rinse after use.  Labs today .  Begin Spiriva 2 puffs daily .  Begin Zyrtec 10 mg at bedtime. Mucinex DM twice daily as needed for cough and congestion. Use albuterol nebulizer as rescue only. For wheezing and shortness of breath. Continue on CPAP at bedtime. Try to wear each night .  Goal is to wear at least 6 hours each night Work on weight loss Followup with Dr. Craige Cotta in 2 months and As needed  .  Please contact office for sooner follow up if symptoms do not improve or worsen or seek emergency care

## 2017-07-25 NOTE — Progress Notes (Signed)
Patient seen in the office today and instructed on use of SPiriva Respimat 2.79mcg.  Patient expressed understanding and demonstrated technique. Andrya Roppolo, CMA 07/25/17

## 2017-07-28 ENCOUNTER — Ambulatory Visit: Payer: Medicaid Other

## 2017-07-28 ENCOUNTER — Other Ambulatory Visit (INDEPENDENT_AMBULATORY_CARE_PROVIDER_SITE_OTHER): Payer: Self-pay

## 2017-07-28 DIAGNOSIS — J45998 Other asthma: Secondary | ICD-10-CM

## 2017-07-28 LAB — CBC WITH DIFFERENTIAL/PLATELET
BASOS ABS: 0 10*3/uL (ref 0.0–0.1)
BASOS PCT: 0.5 % (ref 0.0–3.0)
Eosinophils Absolute: 0.1 10*3/uL (ref 0.0–0.7)
Eosinophils Relative: 2.2 % (ref 0.0–5.0)
HEMATOCRIT: 41.6 % (ref 36.0–46.0)
Hemoglobin: 13 g/dL (ref 12.0–15.0)
LYMPHS PCT: 38.6 % (ref 12.0–46.0)
Lymphs Abs: 2.3 10*3/uL (ref 0.7–4.0)
MCHC: 31.2 g/dL (ref 30.0–36.0)
MCV: 84.5 fl (ref 78.0–100.0)
MONOS PCT: 10 % (ref 3.0–12.0)
Monocytes Absolute: 0.6 10*3/uL (ref 0.1–1.0)
NEUTROS ABS: 3 10*3/uL (ref 1.4–7.7)
Neutrophils Relative %: 48.7 % (ref 43.0–77.0)
PLATELETS: 227 10*3/uL (ref 150.0–400.0)
RBC: 4.92 Mil/uL (ref 3.87–5.11)
RDW: 21.4 % — ABNORMAL HIGH (ref 11.5–15.5)
WBC: 6.1 10*3/uL (ref 4.0–10.5)

## 2017-07-31 LAB — RESPIRATORY ALLERGY PROFILE REGION II ~~LOC~~
Allergen, Cedar tree, t12: <0.1 kU/L
Allergen, Cottonwood, t14: 0.16 kU/L — ABNORMAL HIGH
Allergen, Mulberry, t76: <0.1 kU/L
Allergen, Oak,t7: <0.1 kU/L
Allergen, P. notatum, m1: <0.1 kU/L
Aspergillus fumigatus, m3: <0.1 kU/L
BOX ELDER: 0.17 kU/L — AB
Bermuda Grass: <0.1 kU/L
CLADOSPORIUM HERBARUM (M2) IGE: <0.1 kU/L
CLASS: 0
CLASS: 0
CLASS: 0
CLASS: 0
CLASS: 0
CLASS: 0
CLASS: 0
CLASS: 0
CLASS: 0
CLASS: 0
CLASS: 0
CLASS: 0
COMMON RAGWEED (SHORT) (W1) IGE: 0.1 kU/L — ABNORMAL HIGH
Cat Dander: <0.1 kU/L
Class: 0
Class: 0
Class: 0
Class: 0
Class: 0
Class: 0
Class: 0
Class: 0
Class: 0
Class: 0
Class: 0
Class: 1
Cockroach: <0.1 kU/L
Dog Dander: <0.1 kU/L
ELM IGE: 0.56 kU/L — AB
IGE (IMMUNOGLOBULIN E), SERUM: 446 kU/L — AB (ref <=114)
Pecan/Hickory Tree IgE: <0.1 kU/L
Rough Pigweed  IgE: <0.1 kU/L
Sheep Sorrel IgE: <0.1 kU/L
Timothy Grass: <0.1 kU/L

## 2017-07-31 LAB — INTERPRETATION:

## 2017-08-08 ENCOUNTER — Telehealth: Payer: Self-pay | Admitting: Adult Health

## 2017-08-08 MED ORDER — PREDNISONE 10 MG PO TABS: ORAL_TABLET | ORAL | 0 refills | Status: DC

## 2017-08-08 NOTE — Telephone Encounter (Signed)
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 C/w allergic process  Would continue on current regimen to see if this helps , as zyrtec, spiriva were added to her regimen  On return could look at adding singulair or consider looking at Xolair .  Pt does not have insurance so this is a major barrier for pt with meds.  Please contact office for sooner follow up if symptoms do not improve or worsen or seek emergency care   Spoke with pt, I advised her of the lab results from TP. Pt states she is still SOB and her chest is tight. She would like to see if TP would sen din Prednisone to help with this. TP please advise.

## 2017-08-08 NOTE — Telephone Encounter (Signed)
Prednisone taper #20 , Prednisone  4 tabs for 2 days, then 3 tabs for 2 days, 2 tabs for 2 days, then 1 tab for 2 days, then stop  Please contact office for sooner follow up if symptoms do not improve or worsen or seek emergency care

## 2017-08-08 NOTE — Telephone Encounter (Signed)
Spoke with patient. She is aware of the Prednisone taper. Will go ahead and call this into the [facility].   Nothing else needed at time of call.

## 2017-08-11 ENCOUNTER — Ambulatory Visit: Payer: Medicaid Other | Admitting: Adult Health

## 2017-08-29 ENCOUNTER — Other Ambulatory Visit: Payer: Self-pay | Admitting: Adult Health

## 2017-08-29 ENCOUNTER — Other Ambulatory Visit: Payer: Self-pay | Admitting: Family Medicine

## 2017-08-29 ENCOUNTER — Other Ambulatory Visit: Payer: Self-pay | Admitting: Pulmonary Disease

## 2017-08-29 DIAGNOSIS — Z79899 Other long term (current) drug therapy: Secondary | ICD-10-CM

## 2017-08-29 DIAGNOSIS — I10 Essential (primary) hypertension: Secondary | ICD-10-CM

## 2017-08-29 DIAGNOSIS — G4733 Obstructive sleep apnea (adult) (pediatric): Secondary | ICD-10-CM

## 2017-08-29 MED FILL — FLUCONAZOLE 150 MG TABLET: 150 | 1 days supply | Qty: 1 | Fill #0

## 2017-08-29 MED FILL — DULERA 200 MCG/5 MCG INH: 200-5 | 30 days supply | Qty: 13 | Fill #0

## 2017-08-29 MED FILL — ?PREDNISONE 10 MG TABLET: 10 | 8 days supply | Qty: 20 | Fill #0

## 2017-08-29 MED FILL — TRIAMCINOLONE ACETONIDE 0.1: 0.1 | 40 days supply | Qty: 80 | Fill #1

## 2017-08-29 NOTE — Telephone Encounter (Signed)
VS pt was last seen in the office by TP on 07/25/2017.  The medications that tht pt is asking for is not on her med list and it looks like the albuterol was filled by another provider.  Are you ok with giving these refills?  Thanks

## 2017-08-29 NOTE — Telephone Encounter (Signed)
Spoke with pt. She is requesting prescriptions for Singulair, Albuterol and Budesonide nebs. None of these medications are on her current medication list. States that she was previously getting these prescriptions from another MD.  TP - are you okay with prescribing these? Pt has an upcoming appointment with VS on 08/31/17. Thanks.

## 2017-08-29 NOTE — Telephone Encounter (Signed)
Albuterol /Ventolin is on her MAR  She is on Dulera so can not take Budesonide , - Elwin SleightDulera has a Budesonide like med in it.  Singulair -can be restarted on ov with Dr. Craige CottaSood  If he would approves.   Please contact office for sooner follow up if symptoms do not improve or worsen or seek emergency care

## 2017-08-31 ENCOUNTER — Ambulatory Visit: Payer: Medicaid Other | Admitting: Pulmonary Disease

## 2017-08-31 NOTE — Telephone Encounter (Signed)
lmomtcb x 1 for pt to call back. No meds have been sent to the pharmacy until we speak with the pt.

## 2017-08-31 NOTE — Telephone Encounter (Signed)
Can refill albuterol and singulair.  Please confirm with pt whether she has been using dulera or nebulized budesonide, and then let me know.

## 2017-09-01 ENCOUNTER — Telehealth: Payer: Self-pay

## 2017-09-01 ENCOUNTER — Ambulatory Visit: Payer: Medicaid Other | Admitting: Adult Health

## 2017-09-01 ENCOUNTER — Ambulatory Visit (INDEPENDENT_AMBULATORY_CARE_PROVIDER_SITE_OTHER): Payer: Self-pay | Admitting: Pulmonary Disease

## 2017-09-01 ENCOUNTER — Encounter: Payer: Self-pay | Admitting: Pulmonary Disease

## 2017-09-01 VITALS — BP 118/88 | HR 97 | Ht 65.0 in | Wt 271.2 lb

## 2017-09-01 DIAGNOSIS — J4551 Severe persistent asthma with (acute) exacerbation: Secondary | ICD-10-CM

## 2017-09-01 MED ORDER — PREDNISONE 10 MG PO TABS: ORAL_TABLET | ORAL | 0 refills | Status: DC

## 2017-09-01 MED ORDER — BUDESONIDE 0.25 MG/2ML IN SUSP: 0.2500 mg | Freq: Two times a day (BID) | RESPIRATORY_TRACT | 3 refills | Status: DC

## 2017-09-01 MED ORDER — ALBUTEROL SULFATE HFA 108 (90 BASE) MCG/ACT IN AERS: 2.0000 | INHALATION_SPRAY | Freq: Four times a day (QID) | RESPIRATORY_TRACT | 3 refills | Status: DC | PRN

## 2017-09-01 MED ORDER — MONTELUKAST SODIUM 10 MG PO TABS: 10.0000 mg | ORAL_TABLET | Freq: Every day | ORAL | 11 refills | Status: DC

## 2017-09-01 MED ORDER — ALBUTEROL SULFATE HFA 108 (90 BASE) MCG/ACT IN AERS: 2.0000 | INHALATION_SPRAY | Freq: Four times a day (QID) | RESPIRATORY_TRACT | 5 refills | Status: DC | PRN

## 2017-09-01 MED ORDER — FLUTICASONE PROPIONATE 50 MCG/ACT NA SUSP: 2.0000 | Freq: Every day | NASAL | 2 refills | Status: DC

## 2017-09-01 MED FILL — ?MONTELUKAST SOD 10MG TAB: 10 | 30 days supply | Qty: 30 | Fill #0

## 2017-09-01 MED FILL — BUDESONIDE 0.25 MG/2ML SUSP: 0.25 | 15 days supply | Qty: 60 | Fill #0

## 2017-09-01 MED FILL — FLUTICASONE PROP 50 MCG SPR: 50 | 30 days supply | Qty: 16 | Fill #0

## 2017-09-01 MED FILL — $VENTOLIN HFA 18G INHALER: 108 (90 BAS | 25 days supply | Qty: 18 | Fill #0

## 2017-09-01 NOTE — Telephone Encounter (Signed)
Received PA request from [facility] for Ventolin. Spoke to HowellKatie with [place] [facility], who states covered alternative is [employer] account plannerproair. Per office protocol, Rx for Proair has been sent to preferred pharmacy. Nothing further needed.

## 2017-09-01 NOTE — Telephone Encounter (Signed)
Pt called [facility], pt contact # [phone]

## 2017-09-01 NOTE — Progress Notes (Signed)
Current Outpatient Medications on File Prior to Visit  Medication Sig  . acetaminophen (TYLENOL) 500 MG tablet Take 1,000 mg by mouth every 6 (six) hours as needed for mild pain.  Marland Kitchen albuterol (VENTOLIN HFA) 108 (90 Base) MCG/ACT inhaler Inhale 2 puffs every 6 (six) hours as needed into the lungs for wheezing.  . benzonatate (TESSALON) 100 MG capsule Take 1 capsule (100 mg total) by mouth 3 (three) times daily as needed for cough.  . dextromethorphan (DELSYM) 30 MG/5ML liquid Take 5 mLs (30 mg total) by mouth 2 (two) times daily as needed for cough.  . methocarbamol (ROBAXIN) 500 MG tablet Take 1 tablet (500 mg total) by mouth daily as needed for muscle spasms.  . montelukast (SINGULAIR) 10 MG tablet Take 1 tablet (10 mg total) at bedtime by mouth.  . triamcinolone cream (KENALOG) 0.1 % Apply 1 application topically 2 (two) times daily.   No current facility-administered medications on file prior to visit.      Chief Complaint  Patient presents with  . Follow-up    Pt is wheezing, has productive cough,- cloudy mucus. Pt has chest tightness and pain has worsen. When she is talking with me she is wheezing and has rattle in chest.     Pulmonary tests CT chest 02/05/13 >> no PE, no acute findings Labs 02/11/13 >> ANA negative, Anti-GBM Ab < 1, ANCA negative PFT 05/15/13 >> FEV1 1.90 (76%), FEV1% 72, TLC 4.23 (81%), DLCO 89%, no BD PFT 03/03/17 >> FEV1 1.57 (64%), FEV1% 64, TLC 4.47 (84%), DLCO 685, + BD RAST 07/28/17 >> mild reaction to trees, ragweed; IgE 446  Sleep tests PSG 04/19/13 >> AHI 16.4, SpO2 low 84%.  Cardiac tests Echo 07/09/17 >> EF 60 to 65%  Past medical history Cocaine abuse, CHF, GERD, HTN, Influenza  Past surgical history, Family history, Social history, Allergies all reviewed.  Vital Signs BP 118/88 (BP Location: Left Arm, Cuff Size: Normal)   Pulse 97   Ht 5\' 5"  (1.651 m)   Wt 271 lb 3.2 oz (123 kg)   SpO2 97%   BMI 45.13 kg/m   History of Present  Illness Kathleen Rodriguez is a 51 y.o. female with former smoker with cough, UACS, asthma, OSA.  Since her last visit with me in March, she was in hospital twice.  She continues to have cough, wheeze, and chest congestion.  She is also having sinus congestion and post nasal drip.  She has been using dulera, budesonide, and albuterol several times per day.  She ran out of singulair.  She is not having fever, GI symptoms, swelling, or skin rash.  She has trouble getting around due to her breathing and joint pain.  As a result she hasn't been able to work, and is applying for disability.  Physical Exam  General - pleasant Eyes - pupils reactive ENT - no sinus tenderness, clear nasal discharge, poor dentition, raspy voice, no oral exudate, no LAN Cardiac - regular, no murmur Chest - faint b/l wheeze Abd - soft, non tender Ext - no edema Skin - no rashes Neuro - normal strength Psych - normal mood   Assessment/Plan  Persistent asthma with recurrent exacerbations. - will give another course of prednisone - will simplify her regimen >> budesonide bid and albuterol every 4 hours as needed, singulair - stop dulera and spiriva - will start process to get approval for xolair  Upper airway cough syndrome with post nasal drip. - continue flonase and singulair  Obstructive  sleep apnea. - continue CPAP  Obesity. - discussed option to assist with weight loss   Patient Instructions  Stop dulera and spiriva  Budesonide one vial nebulized twice per day, and rinse mouth after each use  Albuterol one vial nebulized every 4 hours as needed for cough, wheeze, or chest congestion  Will give you another course of prednisone  Singulair 10 mg pill nightly  Flonase one spray in each nostril daily  Will start the process to get approval for xolair  Follow up in 4 to 6 weeks with Dr. Halford Chessman or Tammy Parrett    Chesley Mires, MD [facility] Pager:   [phone] 09/01/2017, 12:40 PM

## 2017-09-01 NOTE — Telephone Encounter (Signed)
Patient has an appt with VS today. This can be addressed then.

## 2017-09-01 NOTE — Telephone Encounter (Signed)
Called and spoke with pt and she stated that she is taking the dulera but not the budesonide in the nebulizer.  Albuterol and singulair has been sent to the pharmacy.

## 2017-09-01 NOTE — Patient Instructions (Signed)
Stop dulera and spiriva  Budesonide one vial nebulized twice per day, and rinse mouth after each use  Albuterol one vial nebulized every 4 hours as needed for cough, wheeze, or chest congestion  Will give you another course of prednisone  Singulair 10 mg pill nightly  Flonase one spray in each nostril daily  Will start the process to get approval for xolair  Follow up in 4 to 6 weeks with Dr. Craige CottaSood or Tammy Parrett

## 2017-09-05 ENCOUNTER — Telehealth: Payer: Self-pay | Admitting: Pulmonary Disease

## 2017-09-05 NOTE — Telephone Encounter (Signed)
Spoke with patient regarding handicap sticker request form, letting her know that she is needing to sign the form, and bring ID in for that information to be completed on the form.   Patient asked to check on the status of her lawyer forms to was given to vs on 09-01-18 on her last ROV. Sending staff message to vs today regarding this request.

## 2017-09-06 NOTE — Telephone Encounter (Signed)
Emailed VS yesterday regarding lawyer paperwork for the pt yesterday. Per VS he would have the paperwork signed and returned back in office by 09-07-17. Will contact patient at that time when this is completed.

## 2017-09-07 ENCOUNTER — Telehealth: Payer: Self-pay | Admitting: Pulmonary Disease

## 2017-09-07 NOTE — Telephone Encounter (Signed)
Spoke with Kathleen Rodriguez, she states she has to go to court on Friday and wants to know if they will be signed by then. She wants to talk to Kelli./ I tried to explain to Kathleen Rodriguez that we would get the paperwork done as soon as possible.

## 2017-09-07 NOTE — Telephone Encounter (Signed)
Tried to call patient again this afternoon, no answer.

## 2017-09-07 NOTE — Telephone Encounter (Signed)
Spoke with patient today regarding paperwork. She is needing lawyer paperwork for court on next Tuesday 09-12-2017 in the morning not on Friday. Emailed VS again this afternoon regarding if paperwork can be sent back over to office by that time.

## 2017-09-07 NOTE — Telephone Encounter (Signed)
Emailed VS again this morning checking on the pt's documents for lawyers  Paperwork and disability forms. Vs is needing to sign and return them back for patient to pick them up this week.  Patient will need to sign the disability paperwork as well before faxing or mailing them out to retrieve handicap stickers. Tried calling patient back this morning, and the phones are disconnected, a timewarner message came on stating phone lines are down this morning.

## 2017-09-11 NOTE — Telephone Encounter (Signed)
Please see phone note from 11.13.2018. Will sign off.

## 2017-09-11 NOTE — Telephone Encounter (Signed)
Pt returned call. Spoke with pt and informed her the forms (disability forms and handicap placard) have been completed by VS and have been faxed back to the [employer] Judge per the records request. The pt's handicap placard along with a copy of the disability forms have been placed up front for pt to pick up. Pt verbalized understanding and denied any further questions or concerns at this time.

## 2017-09-25 ENCOUNTER — Telehealth: Payer: Self-pay | Admitting: Pulmonary Disease

## 2017-09-25 NOTE — Telephone Encounter (Signed)
Dr. Craige CottaSood is not here to sign, I hope he's coming in in the morning and I'll be able to catch him then. If not he'll be back Wed..Marland Kitchen

## 2017-09-29 ENCOUNTER — Ambulatory Visit: Payer: Medicaid Other | Admitting: Pulmonary Disease

## 2017-10-03 ENCOUNTER — Ambulatory Visit: Payer: Medicaid Other | Admitting: Pulmonary Disease

## 2017-10-05 NOTE — Telephone Encounter (Signed)
TS was this taken care of so I can close encounter?

## 2017-10-11 ENCOUNTER — Ambulatory Visit: Payer: Medicaid Other | Admitting: Pulmonary Disease

## 2017-10-11 NOTE — Telephone Encounter (Signed)
Tammy please advise if this has been taken care of.

## 2017-10-13 ENCOUNTER — Telehealth: Payer: Self-pay | Admitting: Pulmonary Disease

## 2017-10-16 NOTE — Telephone Encounter (Signed)
error 

## 2017-10-18 NOTE — Telephone Encounter (Signed)
TS can we close encounter? 

## 2017-10-20 ENCOUNTER — Telehealth: Payer: Self-pay | Admitting: Pharmacy Technician

## 2017-10-20 ENCOUNTER — Encounter (HOSPITAL_COMMUNITY): Payer: Self-pay | Admitting: *Deleted

## 2017-10-20 ENCOUNTER — Emergency Department (HOSPITAL_COMMUNITY): Payer: Medicaid Other

## 2017-10-20 ENCOUNTER — Inpatient Hospital Stay (HOSPITAL_COMMUNITY)
Admission: EM | Admit: 2017-10-20 | Discharge: 2017-10-23 | DRG: 202 | Disposition: A | Discharge status: Home / Self Care | Payer: Medicaid Other | Attending: Internal Medicine | Admitting: Internal Medicine

## 2017-10-20 DIAGNOSIS — R0603 Acute respiratory distress: Secondary | ICD-10-CM

## 2017-10-20 DIAGNOSIS — R739 Hyperglycemia, unspecified: Secondary | ICD-10-CM | POA: Diagnosis present

## 2017-10-20 DIAGNOSIS — Z91018 Allergy to other foods: Secondary | ICD-10-CM

## 2017-10-20 DIAGNOSIS — I10 Essential (primary) hypertension: Secondary | ICD-10-CM | POA: Diagnosis present

## 2017-10-20 DIAGNOSIS — Z915 Personal history of self-harm: Secondary | ICD-10-CM

## 2017-10-20 DIAGNOSIS — Z7951 Long term (current) use of inhaled steroids: Secondary | ICD-10-CM

## 2017-10-20 DIAGNOSIS — J45909 Unspecified asthma, uncomplicated: Secondary | ICD-10-CM | POA: Diagnosis present

## 2017-10-20 DIAGNOSIS — R000 Tachycardia, unspecified: Secondary | ICD-10-CM | POA: Diagnosis present

## 2017-10-20 DIAGNOSIS — G4733 Obstructive sleep apnea (adult) (pediatric): Secondary | ICD-10-CM | POA: Diagnosis present

## 2017-10-20 DIAGNOSIS — E46 Unspecified protein-calorie malnutrition: Secondary | ICD-10-CM

## 2017-10-20 DIAGNOSIS — R0902 Hypoxemia: Secondary | ICD-10-CM | POA: Diagnosis present

## 2017-10-20 DIAGNOSIS — J45901 Unspecified asthma with (acute) exacerbation: Principal | ICD-10-CM | POA: Diagnosis present

## 2017-10-20 DIAGNOSIS — Y92230 Patient room in hospital as the place of occurrence of the external cause: Secondary | ICD-10-CM | POA: Diagnosis not present

## 2017-10-20 DIAGNOSIS — T380X5A Adverse effect of glucocorticoids and synthetic analogues, initial encounter: Secondary | ICD-10-CM | POA: Diagnosis not present

## 2017-10-20 DIAGNOSIS — Z91048 Other nonmedicinal substance allergy status: Secondary | ICD-10-CM

## 2017-10-20 DIAGNOSIS — Z96652 Presence of left artificial knee joint: Secondary | ICD-10-CM | POA: Diagnosis present

## 2017-10-20 DIAGNOSIS — E440 Moderate protein-calorie malnutrition: Secondary | ICD-10-CM

## 2017-10-20 DIAGNOSIS — E43 Unspecified severe protein-calorie malnutrition: Secondary | ICD-10-CM | POA: Diagnosis present

## 2017-10-20 DIAGNOSIS — Z23 Encounter for immunization: Secondary | ICD-10-CM

## 2017-10-20 DIAGNOSIS — Z6841 Body Mass Index (BMI) 40.0 and over, adult: Secondary | ICD-10-CM

## 2017-10-20 DIAGNOSIS — E669 Obesity, unspecified: Secondary | ICD-10-CM | POA: Diagnosis present

## 2017-10-20 DIAGNOSIS — Z91040 Latex allergy status: Secondary | ICD-10-CM

## 2017-10-20 DIAGNOSIS — Z79899 Other long term (current) drug therapy: Secondary | ICD-10-CM

## 2017-10-20 DIAGNOSIS — Z830 Family history of human immunodeficiency virus [HIV] disease: Secondary | ICD-10-CM

## 2017-10-20 DIAGNOSIS — Z87891 Personal history of nicotine dependence: Secondary | ICD-10-CM

## 2017-10-20 DIAGNOSIS — Z8249 Family history of ischemic heart disease and other diseases of the circulatory system: Secondary | ICD-10-CM

## 2017-10-20 DIAGNOSIS — K219 Gastro-esophageal reflux disease without esophagitis: Secondary | ICD-10-CM | POA: Diagnosis present

## 2017-10-20 LAB — COMPREHENSIVE METABOLIC PANEL
ALBUMIN: 3.1 g/dL — AB (ref 3.5–5.0)
ALK PHOS: 48 U/L (ref 38–126)
ALT: 21 U/L (ref 14–54)
AST: 19 U/L (ref 15–41)
Anion gap: 8 (ref 5–15)
BILIRUBIN TOTAL: 0.5 mg/dL (ref 0.3–1.2)
BUN: 9 mg/dL (ref 6–20)
CALCIUM: 8.3 mg/dL — AB (ref 8.9–10.3)
CO2: 25 mmol/L (ref 22–32)
CREATININE: 0.97 mg/dL (ref 0.44–1.00)
Chloride: 106 mmol/L (ref 101–111)
GFR calc Af Amer: >60 mL/min (ref >60)
GFR calc non Af Amer: >60 mL/min (ref >60)
GLUCOSE: 101 mg/dL — AB (ref 65–99)
Potassium: 3.8 mmol/L (ref 3.5–5.1)
SODIUM: 139 mmol/L (ref 135–145)
Total Protein: 6.1 g/dL — ABNORMAL LOW (ref 6.5–8.1)

## 2017-10-20 LAB — I-STAT VENOUS BLOOD GAS, ED
ACID-BASE DEFICIT: 3 mmol/L — AB (ref 0.0–2.0)
Bicarbonate: 24.8 mmol/L (ref 20.0–28.0)
O2 Saturation: 53 %
PCO2 VEN: 56.7 mmHg (ref 44.0–60.0)
TCO2: 26 mmol/L (ref 22–32)
pH, Ven: 7.248 — ABNORMAL LOW (ref 7.250–7.430)
pO2, Ven: 33 mmHg (ref 32.0–45.0)

## 2017-10-20 LAB — TROPONIN I

## 2017-10-20 LAB — CBC WITH DIFFERENTIAL/PLATELET
BASOS PCT: 0 %
Basophils Absolute: 0 10*3/uL (ref 0.0–0.1)
EOS ABS: 0.5 10*3/uL (ref 0.0–0.7)
Eosinophils Relative: 6 %
HEMATOCRIT: 38.7 % (ref 36.0–46.0)
HEMOGLOBIN: 12.5 g/dL (ref 12.0–15.0)
Lymphocytes Relative: 40 %
Lymphs Abs: 3.3 10*3/uL (ref 0.7–4.0)
MCH: 27.4 pg (ref 26.0–34.0)
MCHC: 32.3 g/dL (ref 30.0–36.0)
MCV: 84.7 fL (ref 78.0–100.0)
Monocytes Absolute: 0.9 10*3/uL (ref 0.1–1.0)
Monocytes Relative: 11 %
NEUTROS ABS: 3.5 10*3/uL (ref 1.7–7.7)
NEUTROS PCT: 43 %
Platelets: 298 10*3/uL (ref 150–400)
RBC: 4.57 MIL/uL (ref 3.87–5.11)
RDW: 18.1 % — ABNORMAL HIGH (ref 11.5–15.5)
WBC: 8.1 10*3/uL (ref 4.0–10.5)

## 2017-10-20 LAB — BRAIN NATRIURETIC PEPTIDE: B Natriuretic Peptide: 14.3 pg/mL (ref 0.0–100.0)

## 2017-10-20 MED ORDER — ALBUTEROL (5 MG/ML) CONTINUOUS INHALATION SOLN
15.0000 mg/h | INHALATION_SOLUTION | RESPIRATORY_TRACT | Status: DC
  Administered 2017-10-20: 15 mg/h via RESPIRATORY_TRACT
  Filled 2017-10-20: qty 20

## 2017-10-20 MED ORDER — MONTELUKAST SODIUM 10 MG PO TABS
10.0000 mg | ORAL_TABLET | Freq: Every day | ORAL | Status: DC
  Administered 2017-10-20 – 2017-10-22 (×3): 10 mg via ORAL
  Filled 2017-10-20 (×3): qty 1

## 2017-10-20 MED ORDER — GABAPENTIN 300 MG PO CAPS
300.0000 mg | ORAL_CAPSULE | Freq: Once | ORAL | Status: AC
  Administered 2017-10-20: 300 mg via ORAL
  Filled 2017-10-20: qty 1

## 2017-10-20 MED ORDER — ALBUTEROL SULFATE (2.5 MG/3ML) 0.083% IN NEBU
2.5000 mg | INHALATION_SOLUTION | Freq: Four times a day (QID) | RESPIRATORY_TRACT | Status: DC
  Administered 2017-10-20 – 2017-10-21 (×2): 2.5 mg via RESPIRATORY_TRACT
  Filled 2017-10-20 (×3): qty 3

## 2017-10-20 MED ORDER — FAMOTIDINE 20 MG PO TABS: 10.0000 mg | ORAL_TABLET | Freq: Every day | ORAL | Status: DC | PRN

## 2017-10-20 MED ORDER — ALBUTEROL SULFATE (2.5 MG/3ML) 0.083% IN NEBU
2.5000 mg | INHALATION_SOLUTION | RESPIRATORY_TRACT | Status: DC | PRN
  Filled 2017-10-20: qty 3

## 2017-10-20 MED ORDER — GUAIFENESIN-DM 100-10 MG/5ML PO SYRP
5.0000 mL | ORAL_SOLUTION | ORAL | Status: DC | PRN
  Administered 2017-10-21 – 2017-10-23 (×5): 5 mL via ORAL
  Filled 2017-10-20 (×5): qty 5

## 2017-10-20 MED ORDER — DEXTROSE 5 % IV SOLN
500.0000 mg | INTRAVENOUS | Status: DC
  Administered 2017-10-20 – 2017-10-21 (×2): 500 mg via INTRAVENOUS
  Filled 2017-10-20 (×4): qty 500

## 2017-10-20 MED ORDER — ENOXAPARIN SODIUM 40 MG/0.4ML ~~LOC~~ SOLN
40.0000 mg | SUBCUTANEOUS | Status: DC
  Administered 2017-10-20: 40 mg via SUBCUTANEOUS
  Filled 2017-10-20 (×2): qty 0.4

## 2017-10-20 MED ORDER — FLUTICASONE PROPIONATE 50 MCG/ACT NA SUSP
2.0000 | Freq: Every day | NASAL | Status: DC
  Administered 2017-10-20 – 2017-10-23 (×4): 2 via NASAL
  Filled 2017-10-20 (×2): qty 16

## 2017-10-20 MED ORDER — BUDESONIDE 0.25 MG/2ML IN SUSP
0.2500 mg | Freq: Two times a day (BID) | RESPIRATORY_TRACT | Status: DC
  Administered 2017-10-20 – 2017-10-23 (×6): 0.25 mg via RESPIRATORY_TRACT
  Filled 2017-10-20 (×7): qty 2

## 2017-10-20 MED ORDER — METHYLPREDNISOLONE SODIUM SUCC 125 MG IJ SOLR
80.0000 mg | Freq: Three times a day (TID) | INTRAMUSCULAR | Status: DC
  Administered 2017-10-20 – 2017-10-21 (×2): 80 mg via INTRAVENOUS
  Filled 2017-10-20 (×3): qty 2

## 2017-10-20 MED ORDER — METHOCARBAMOL 500 MG PO TABS
500.0000 mg | ORAL_TABLET | Freq: Every day | ORAL | Status: DC | PRN
  Administered 2017-10-20: 500 mg via ORAL
  Filled 2017-10-20: qty 1

## 2017-10-20 NOTE — ED Notes (Signed)
Spoke with Velna HatchetSheila in the lab requested to send down rec for add on.

## 2017-10-20 NOTE — Progress Notes (Signed)
Report received from the ED. Pt. Arrived to the unit at 1730 via bed.

## 2017-10-20 NOTE — ED Triage Notes (Signed)
Pt in from home via [facility], per report pt has SOB, pt hx of asthma with Intubation, pt rcvd 15 mg Albuterol, 1 mg Atrovent, & 2 g Magnesium, pt speaks in short sentences, A&O x4

## 2017-10-20 NOTE — Telephone Encounter (Signed)
[facility], line rang for over 2 minutes with no answer and then line went silent.  Wcb.

## 2017-10-20 NOTE — ED Notes (Signed)
Pt unable to walk to restroom due to shortness of breath. Wheelchair used pt tolerated well and is resting in bed on monitors

## 2017-10-20 NOTE — H&P (Addendum)
TRH H&P   Patient Demographics:    Kathleen Rodriguez, is a 51 y.o. female  MRN: [id number]   DOB - [birth year]  Admit Date - 10/20/2017  Outpatient Primary MD for the patient is Jaclyn ShaggyAmao, [name], MD  Referring MD/NP/PA: Marily Memosjason Mesner  Outpatient Specialists:      Patient coming from: home  Chief Complaint  Patient presents with  . Asthma      HPI:    Kathleen Rodriguez  is a 51 y.o. female, w Asthma, apparently has had cough w yellow sputum for the past 2 days along with dyspnea and wheezing.  Pt tried albuterol at home without relief and therefore presented to ED for evaluation.  Pt denies fever, chills, cp, palp, n/v, diarrhea, brbpr, black stool.   In Ed,    CXR IMPRESSION: No active disease.  BNP 14.3 Trop and ekg pending  Wbc 8.1 Hgb 12.5, Plt 298 Na 139, K 3.8, Bun 9, Creatinine 0.97 Alb 3.1    Pt wheezing and dyspneic, and will be admitted for Asthma exacerbation.    Review of systems:    In addition to the HPI above,  No Fever-chills, No Headache, No changes with Vision or hearing, No problems swallowing food or Liquids, No chest pain  No Abdominal pain, No Nausea or Vommitting, Bowel movements are regular, No Blood in stool or Urine, No dysuria, No new skin rashes or bruises, No new joints pains-aches,  No new weakness, tingling, numbness in any extremity, No recent weight gain or loss, No polyuria, polydypsia or polyphagia, No significant Mental Stressors.  A full 10 point Review of Systems was done, except as stated above, all other Review of Systems were negative.   With Past History of the following :    Past Medical History:  Diagnosis Date  . Arthritis    "knees, lower back; legs, ankles" (01/27/2016)  . Asthma    followed by Dr. Craige CottaSood  . CHF (congestive heart failure) (HCC) 2016   "when I went into a coma"  . Cocaine abuse (HCC)   . 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 Dyspnea   . GERD (gastroesophageal reflux disease)   . Hypertension    "doctor took me off RX in 2016" (01/27/2016)  . Influenza B April 2014   Complicated by multi-organ failure  . OSA on CPAP "since " 03/20/2013  . Pneumonia 2016  . Required emergent intubation    asthma exacerbation in 2016  . Tobacco abuse   . Upper airway cough syndrome       Past Surgical History:  Procedure Laterality Date  . BREAST SURGERY Right 1980   as a teenager , cyst was benign  . CESAREAN SECTION  2006  . LACERATION REPAIR Right ~ 1997   "tried to cut myself"  . TOTAL KNEE ARTHROPLASTY Left 09/01/2016   Procedure: LEFT TOTAL KNEE ARTHROPLASTY;  Surgeon:  Tarry Kos, MD;  Location: [facility];  Service: Orthopedics;  Laterality: Left;  . TUBAL LIGATION  2006      Social History:     Social History   Tobacco Use  . Smoking status: Former Smoker    Packs/day: 0.25    Years: 20.00    Pack years: 5.00    Types: Cigarettes    Last attempt to quit: 01/31/2013    Years since quitting: 4.7  . Smokeless tobacco: Never Used  Substance Use Topics  . Alcohol use: No    Comment: 01/26/2006 "clean from drinking since 2007"     Lives - at home  Mobility - walks by self     Family History :     Family History  Problem Relation Age of Onset  . Hypertension Mother   . HIV/AIDS Father       Home Medications:   Prior to Admission medications   Medication Sig Start Date End Date Taking? Authorizing Provider  acetaminophen (TYLENOL) 500 MG tablet Take 1,000 mg by mouth every 6 (six) hours as needed for mild pain.   Yes [provider]  albuterol (PROAIR HFA) 108 (90 Base) MCG/ACT inhaler Inhale 2 puffs every 6 (six) hours as needed into the lungs for wheezing or shortness of breath. 09/01/17  Yes [name], MD  benzonatate (TESSALON) 100 MG capsule Take 1 capsule (100 mg total) by mouth 3 (three) times daily as needed for cough. 07/12/17  Yes Shah, Pratik D,  DO  budesonide (PULMICORT) 0.25 MG/2ML nebulizer solution Take 2 mLs (0.25 mg total) 2 (two) times daily by nebulization. 09/01/17 09/01/18 Yes [name], MD  fluticasone (FLONASE) 50 MCG/ACT nasal spray Place 2 sprays daily into both nostrils. 09/01/17  Yes [name], MD  methocarbamol (ROBAXIN) 500 MG tablet Take 1 tablet (500 mg total) by mouth daily as needed for muscle spasms. 07/12/17  Yes Shah, Pratik D, DO  montelukast (SINGULAIR) 10 MG tablet Take 1 tablet (10 mg total) at bedtime by mouth. 09/01/17  Yes [name], MD  triamcinolone cream (KENALOG) 0.1 % Apply 1 application topically 2 (two) times daily. 05/26/17  Yes [name], MD  dextromethorphan (DELSYM) 30 MG/5ML liquid Take 5 mLs (30 mg total) by mouth 2 (two) times daily as needed for cough. Patient not taking: Reported on 10/20/2017 07/12/17   Maurilio Lovely D, DO  predniSONE (DELTASONE) 10 MG tablet Take 4 tabs for 2 days, then 3 tabs for 2 days, 2 tabs for 2 days, then 1 tab for 2 days, then stop. Patient not taking: Reported on 10/20/2017 09/01/17   [name], MD     Allergies:     Allergies  Allergen Reactions  . Tomato Hives, Itching and Other (See Comments)    ALSO REACTS TO KETCHUP  . Latex Itching and Rash  . Wool Alcohol [Lanolin] Itching     Physical Exam:   Vitals  Blood pressure 130/75, pulse (!) 110, temperature 97.6 F (36.4 C), temperature source Oral, resp. rate (!) 24, SpO2 94 %.   1. General  lying in bed in NAD,    2. Normal affect and insight, Not Suicidal or Homicidal, Awake Alert, Oriented X 3.  3. No F.N deficits, ALL C.Nerves Intact, Strength 5/5 all 4 extremities, Sensation intact all 4 extremities, Plantars down going.  4. Ears and Eyes appear Normal, Conjunctivae clear, PERRLA. Moist Oral Mucosa.  5. Supple Neck, No JVD, No cervical lymphadenopathy appriciated, No Carotid Bruits.  6.  Symmetrical Chest wall movement, Good air movement bilaterally, + bilateral exp wheezing, no  crackles  7. Tachy s1, s2, no m/g/r  8. Positive Bowel Sounds, Abdomen Soft, No tenderness, No organomegaly appriciated,No rebound -guarding or rigidity.  9.  No Cyanosis, Normal Skin Turgor, No Skin Rash or Bruise.  10. Good muscle tone,  joints appear normal , no effusions, Normal ROM.  11. No Palpable Lymph Nodes in Neck or Axillae     Data Review:    CBC Recent Labs  Lab 10/20/17 1352  WBC 8.1  HGB 12.5  HCT 38.7  PLT 298  MCV 84.7  MCH 27.4  MCHC 32.3  RDW 18.1*  LYMPHSABS 3.3  MONOABS 0.9  EOSABS 0.5  BASOSABS 0.0   ------------------------------------------------------------------------------------------------------------------  Chemistries  Recent Labs  Lab 10/20/17 1352  NA 139  K 3.8  CL 106  CO2 25  GLUCOSE 101*  BUN 9  CREATININE 0.97  CALCIUM 8.3*  AST 19  ALT 21  ALKPHOS 48  BILITOT 0.5   ------------------------------------------------------------------------------------------------------------------ CrCl cannot be calculated (Unknown ideal weight.). ------------------------------------------------------------------------------------------------------------------ No results for input(s): TSH, T4TOTAL, T3FREE, THYROIDAB in the last 72 hours.  Invalid input(s): FREET3  Coagulation profile No results for input(s): INR, PROTIME in the last 168 hours. ------------------------------------------------------------------------------------------------------------------- No results for input(s): DDIMER in the last 72 hours. -------------------------------------------------------------------------------------------------------------------  Cardiac Enzymes No results for input(s): CKMB, TROPONINI, MYOGLOBIN in the last 168 hours.  Invalid input(s): CK ------------------------------------------------------------------------------------------------------------------    Component Value Date/Time   BNP 27.2 07/09/2017 1043      ---------------------------------------------------------------------------------------------------------------  Urinalysis    Component Value Date/Time   COLORURINE YELLOW 07/09/2017 1453   APPEARANCEUR TURBID (A) 07/09/2017 1453   LABSPEC >1.030 (H) 07/09/2017 1453   PHURINE 5.5 07/09/2017 1453   GLUCOSEU NEGATIVE 07/09/2017 1453   HGBUR SMALL (A) 07/09/2017 1453   BILIRUBINUR SMALL (A) 07/09/2017 1453   BILIRUBINUR small 05/26/2017 1539   KETONESUR 15 (A) 07/09/2017 1453   PROTEINUR TRACE (A) 07/09/2017 1453   UROBILINOGEN 1.0 05/26/2017 1539   UROBILINOGEN 1.0 03/22/2014 0805   NITRITE NEGATIVE 07/09/2017 1453   LEUKOCYTESUR NEGATIVE 07/09/2017 1453    ----------------------------------------------------------------------------------------------------------------   Imaging Results:    Dg Chest Portable 1 View  Result Date: 10/20/2017 CLINICAL DATA:  Shortness of Breath EXAM: PORTABLE CHEST 1 VIEW COMPARISON:  07/09/2017 FINDINGS: The heart size and mediastinal contours are within normal limits. Both lungs are clear. The visualized skeletal structures are unremarkable. IMPRESSION: No active disease. Electronically Signed   By: Alcide Clever M.D.   On: 10/20/2017 14:31   ekg pending   Assessment & Plan:    Principal Problem:   Asthma exacerbation Active Problems:   Protein calorie malnutrition (HCC)   GERD (gastroesophageal reflux disease)   Essential hypertension   Asthma exacerbation solumedro 80mg  iv q8h zithromax 500mg  iv qday Albuterol neb q6h and q6h prn Continue pulmicort Cont singulair Pt states spiriva doesn't help  Cough Robitussin  Hayfever Flonase ns  Hypertension Diet controlled  Protein calorie malnutrition boost  Gerd pepcid prn   DVT Prophylaxis Lovenox - SCDs   AM Labs Ordered, also please review Full Orders  Family Communication: Admission, patients condition and plan of care including tests being ordered have been  discussed with the patient  who indicate understanding and agree with the plan and Code Status.  Code Status FULL CODE  Likely DC to  home  Condition GUARDED    Consults called: none  Admission status: obs   Time spent in  minutes : 45   Pearson GrippeJames Euphemia Lingerfelt M.D on 10/20/2017 at 4:07 PM  Between 7am to 7pm - Pager - [phone]  . After 7pm go to [email or web address] - password [email or web address]  [facility] - Office  [phone]

## 2017-10-20 NOTE — ED Provider Notes (Signed)
Emergency Department Provider Note   I have reviewed the triage vital signs and the nursing notes.   HISTORY  Chief Complaint Asthma   HPI Kathleen Rodriguez is a 51 y.o. female the past medical history of CHF, asthma, cocaine abuse, OSA on CPAP who presents to the emerge from today secondary to respiratory distress.  Patient states that she has had asthma so severe that she needed intubated in the past and she has been having worsening shortness of breath the last couple days.  She is recently treated for pneumonia but has not really had a fever since then.  She had worsening dyspnea that she is required albuterol nebulizer as often as every 2 hours over the last 12 hours without any improvement so called EMS.  EMS states that when they first got there she was in moderate respiratory distress she  had very tight lung sounds with end expiratory wheezing and tachypnea with accessory muscle usage.  She was tachycardic, tachypneic and hypoxic to 95%.  They administered 125 mg of Solu-Medrol, 50 mg of albuterol, 1 mg of ipratropium, 2 g of magnesium. Here patient feels some improved but not baseline.    Past Medical History:  Diagnosis Date  . Arthritis    "knees, lower back; legs, ankles" (01/27/2016)  . Asthma    followed by Dr. Craige Cotta  . CHF (congestive heart failure) (HCC) 2016   "when I went into a coma"  . Cocaine abuse (HCC)   . 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 Dyspnea   . GERD (gastroesophageal reflux disease)   . Hypertension    "doctor took me off RX in 2016" (01/27/2016)  . Influenza B April 2014   Complicated by multi-organ failure  . OSA on CPAP "since " 03/20/2013  . Pneumonia 2016  . Required emergent intubation    asthma exacerbation in 2016  . Tobacco abuse   . Upper airway cough syndrome     Patient Active Problem List   Diagnosis Date Noted  . Leukocytosis 07/11/2017  . Acute bronchitis   . Hypokalemia 07/10/2017  . Eczema 05/26/2017  . Osteoarthritis of  left knee 09/01/2016  . 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 Knee pain, 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  . Status asthmaticus 06/24/2016  . Acute respiratory failure (HCC) 06/24/2016  . Acute asthma exacerbation 05/12/2016  . Acute respiratory failure with hypoxia (HCC) 05/12/2016  . Tobacco abuse   . Primary osteoarthritis of left knee 03/09/2016  . Asthma with status asthmaticus 01/27/2016  . Acute respiratory distress 01/27/2016  . Non compliance with medical treatment 08/19/2015  . Anemia, iron deficiency 08/12/2015  . Essential hypertension 07/25/2015  . Sinus tachycardia (HCC) 07/25/2015  . Acute respiratory failure with hypercapnia (HCC) 07/24/2015  . COPD (chronic obstructive pulmonary disease) (HCC) 12/28/2014  . Tracheobronchitis 12/28/2014  . Dysfunctional uterine bleeding 12/28/2014  . Anemia 12/28/2014  . Pleuritic chest pain 05/16/2014  . Morbid obesity (HCC) 05/16/2014  . Chronic cough 05/01/2014  . Upper airway cough syndrome 05/01/2014  . Vaginal candidiasis 04/09/2014  . GERD (gastroesophageal reflux disease) 04/08/2014  . Medically noncompliant 04/07/2014  . Asthma exacerbation 04/06/2014  . CAP (community acquired pneumonia) 04/06/2014  . Chest pain 04/01/2014  . Hypertension 09/24/2013  . OSA (obstructive sleep apnea) 03/20/2013  . Uncontrolled persistent asthma 02/05/2013    Past Surgical History:  Procedure Laterality Date  . BREAST SURGERY Right 1980   as a teenager , cyst was benign  . CESAREAN SECTION  2006  .  LACERATION REPAIR Right ~ 1997   "tried to cut myself"  . TOTAL KNEE ARTHROPLASTY Left 09/01/2016   Procedure: LEFT TOTAL KNEE ARTHROPLASTY;  Surgeon: Tarry KosNaiping M Xu, MD;  Location: [facility] OR;  Service: Orthopedics;  Laterality: Left;  . TUBAL LIGATION  2006    Current Outpatient Rx  . Order #: [id number] Class: Historical Med  . Order #: [id number] Class: Normal  . Order #: [id number] Class: Normal  . Order #: [id number] Class: Normal  . Order #:  [id number] Class: Normal  . Order #: [id number] Class: Normal  . Order #: [id number] Class: Print  . Order #: [id number] Class: Normal  . Order #: [id number] Class: Normal  . Order #: [id number] Class: Normal    Allergies Tomato; Latex; and Wool alcohol [lanolin]  Family History  Problem Relation Age of Onset  . Hypertension Mother   . HIV/AIDS Father     Social History Social History   Tobacco Use  . Smoking status: Former Smoker    Packs/day: 0.25    Years: 20.00    Pack years: 5.00    Types: Cigarettes    Last attempt to quit: 01/31/2013    Years since quitting: 4.7  . Smokeless tobacco: Never Used  Substance Use Topics  . 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 Drug use: No    Comment: 01/27/2016 "clean from smoking crack since 2007"    Review of Systems  All other systems negative except as documented in the HPI. All pertinent positives and negatives as reviewed in the HPI. ____________________________________________   PHYSICAL EXAM:  VITAL SIGNS: ED Triage Vitals  Enc Vitals Group     BP 10/20/17 1354 (!) 144/68     Pulse Rate 10/20/17 1354 89     Resp 10/20/17 1354 20     Temp 10/20/17 1355 97.6 F (36.4 C)     Temp Source 10/20/17 1355 Oral     SpO2 10/20/17 1347 95 %     Weight --      Height --      Head Circumference --      Peak Flow --      Pain Score 10/20/17 1356 8     Pain Loc --      Pain Edu? --      Excl. in GC? --     Constitutional: Alert and oriented. Well appearing and in mild respiratory distress Eyes: Conjunctivae are normal. PERRL. EOMI. Head: Atraumatic. Nose: No congestion/rhinnorhea. Mouth/Throat: Mucous membranes are moist.  Oropharynx non-erythematous. Neck: No stridor.  No meningeal signs.   Cardiovascular: tachycardic rate, regular rhythm. Good peripheral circulation. Grossly normal heart sounds.   Respiratory: tachypneic respiratory effort.  No retractions. Lungs significantly diminished with  wheezing. Accessory muscle usage. Gastrointestinal: Soft and nontender. No distention.  Musculoskeletal: No lower extremity tenderness nor edema. No gross deformities of extremities. Neurologic:  Normal speech and language. No gross focal neurologic deficits are appreciated.  Skin:  Skin is warm, dry and intact. No rash noted.   ____________________________________________   LABS (all labs ordered are listed, but only abnormal results are displayed)  Labs Reviewed  CBC WITH DIFFERENTIAL/PLATELET - Abnormal; Notable for the following components:      Result Value   RDW 18.1 (*)    All other components within normal limits  COMPREHENSIVE METABOLIC PANEL - Abnormal; Notable for the following components:   Glucose, Bld 101 (*)    Calcium 8.3 (*)    Total Protein 6.1 (*)  Albumin 3.1 (*)    All other components within normal limits  I-STAT VENOUS BLOOD GAS, ED - Abnormal; Notable for the following components:   pH, Ven 7.248 (*)    Acid-base deficit 3.0 (*)    All other components within normal limits  BLOOD GAS, VENOUS   ____________________________________________  EKG   EKG Interpretation  Date/Time:    Ventricular Rate:    PR Interval:    QRS Duration:   QT Interval:    QTC Calculation:   R Axis:     Text Interpretation:         ____________________________________________  RADIOLOGY  Dg Chest Portable 1 View  Result Date: 10/20/2017 CLINICAL DATA:  Shortness of Breath EXAM: PORTABLE CHEST 1 VIEW COMPARISON:  07/09/2017 FINDINGS: The heart size and mediastinal contours are within normal limits. Both lungs are clear. The visualized skeletal structures are unremarkable. IMPRESSION: No active disease. Electronically Signed   By: Alcide CleverMark  Lukens M.D.   On: 10/20/2017 14:31    ____________________________________________   PROCEDURES  Procedure(s) performed:   Procedures  CRITICAL CARE Performed by: Marily MemosMesner, Temisha Murley Total critical care time: 35  minutes Critical care time was exclusive of separately billable procedures and treating other patients. Critical care was necessary to treat or prevent imminent or life-threatening deterioration. Critical care was time spent personally by me on the following activities: development of treatment plan with patient and/or surrogate as well as nursing, discussions with consultants, evaluation of patient's response to treatment, examination of patient, obtaining history from patient or surrogate, ordering and performing treatments and interventions, ordering and review of laboratory studies, ordering and review of radiographic studies, pulse oximetry and re-evaluation of patient's condition.  ____________________________________________   INITIAL IMPRESSION / ASSESSMENT AND PLAN / ED COURSE  Here with moderate respiratory distress with associated accessory muscle usage, tachypnea diminished breath sounds and wheezing consistent with likely asthma exacerbation.  She is Artie had 15 mg of albuterol 1 of the ipratropium, steroids and magnesium.  She states he does feel little bit better.  However she does still to be in acute respiratory distress.  We will continue albuterol here.  Consider BiPAP however with her mild improvement symptoms will continue to be observed. Reevaluation patient still with tachypnea and mild hypoxia.  Breath sounds still diminished.  We will continue continuous albuterol.  No indication for noninvasive ventilation at this time. Reevaluation patient is getting a repeat breathing treatment and is much more comfortable.  Patient will likely need to be admitted for continuous albuterol for now.   Pertinent labs & imaging results that were available during my care of the patient were reviewed by me and considered in my medical decision making (see chart for details).  ____________________________________________  FINAL CLINICAL IMPRESSION(S) / ED DIAGNOSES  Final diagnoses:   Exacerbation of asthma, unspecified asthma severity, unspecified whether persistent  Respiratory distress     MEDICATIONS GIVEN DURING THIS VISIT:  Medications  albuterol (PROVENTIL,VENTOLIN) solution continuous neb (15 mg/hr Nebulization New Bag/Given 10/20/17 1537)     Kadence Mimbs, Barbara CowerJason, MD 10/20/17 1606

## 2017-10-20 NOTE — Progress Notes (Signed)
Kathleen Rodriguez is a 51 y.o. female patient admitted from ED awake, alert - oriented  X 4 - no acute distress noted.  VSS - Blood pressure 132/64, pulse (!) 118, temperature 98.4 F (36.9 C), temperature source Oral, resp. rate 18, SpO2 95 %.    IV in place, occlusive dsg intact without redness.  Orientation to room, and floor completed with information packet given to patient/family.  Patient declined safety video at this time.  Admission INP armband ID verified with patient/family, and in place.   SR up x 2, fall assessment complete, with patient and family able to verbalize understanding of risk associated with falls, and verbalized understanding to call nsg before up out of bed.  Call light within reach, patient able to voice, and demonstrate understanding.  Skin, clean-dry- intact without evidence of bruising, or skin tears.   No evidence of skin break down noted on exam.     Will cont to eval and treat per MD orders.  Melvenia NeedlesIreti O Alliene Klugh, RN 10/20/2017 6:25 PM

## 2017-10-21 ENCOUNTER — Other Ambulatory Visit: Payer: Self-pay

## 2017-10-21 DIAGNOSIS — I10 Essential (primary) hypertension: Secondary | ICD-10-CM

## 2017-10-21 DIAGNOSIS — J45901 Unspecified asthma with (acute) exacerbation: Principal | ICD-10-CM

## 2017-10-21 DIAGNOSIS — K219 Gastro-esophageal reflux disease without esophagitis: Secondary | ICD-10-CM

## 2017-10-21 LAB — COMPREHENSIVE METABOLIC PANEL
ALK PHOS: 50 U/L (ref 38–126)
ALT: 23 U/L (ref 14–54)
AST: 26 U/L (ref 15–41)
Albumin: 3.1 g/dL — ABNORMAL LOW (ref 3.5–5.0)
Anion gap: 9 (ref 5–15)
BUN: 16 mg/dL (ref 6–20)
CALCIUM: 8.8 mg/dL — AB (ref 8.9–10.3)
CO2: 21 mmol/L — AB (ref 22–32)
CREATININE: 1.02 mg/dL — AB (ref 0.44–1.00)
Chloride: 108 mmol/L (ref 101–111)
GFR calc non Af Amer: >60 mL/min (ref >60)
GLUCOSE: 163 mg/dL — AB (ref 65–99)
Potassium: 4 mmol/L (ref 3.5–5.1)
SODIUM: 138 mmol/L (ref 135–145)
Total Bilirubin: 0.3 mg/dL (ref 0.3–1.2)
Total Protein: 6.1 g/dL — ABNORMAL LOW (ref 6.5–8.1)

## 2017-10-21 LAB — CBC
HCT: 36.7 % (ref 36.0–46.0)
Hemoglobin: 11.7 g/dL — ABNORMAL LOW (ref 12.0–15.0)
MCH: 26.9 pg (ref 26.0–34.0)
MCHC: 31.9 g/dL (ref 30.0–36.0)
MCV: 84.4 fL (ref 78.0–100.0)
PLATELETS: 300 10*3/uL (ref 150–400)
RBC: 4.35 MIL/uL (ref 3.87–5.11)
RDW: 18.3 % — AB (ref 11.5–15.5)
WBC: 7.7 10*3/uL (ref 4.0–10.5)

## 2017-10-21 LAB — INFLUENZA PANEL BY PCR (TYPE A & B)
INFLBPCR: NEGATIVE
Influenza A By PCR: NEGATIVE

## 2017-10-21 MED ORDER — INFLUENZA VAC SPLIT QUAD 0.5 ML IM SUSY
0.5000 mL | PREFILLED_SYRINGE | INTRAMUSCULAR | Status: AC
  Administered 2017-10-22: 0.5 mL via INTRAMUSCULAR
  Filled 2017-10-21: qty 0.5

## 2017-10-21 MED ORDER — CYCLOBENZAPRINE HCL 10 MG PO TABS
10.0000 mg | ORAL_TABLET | Freq: Once | ORAL | Status: AC
  Administered 2017-10-21: 10 mg via ORAL
  Filled 2017-10-21: qty 1

## 2017-10-21 MED ORDER — CYCLOBENZAPRINE HCL 5 MG PO TABS
5.0000 mg | ORAL_TABLET | Freq: Three times a day (TID) | ORAL | Status: DC | PRN
  Administered 2017-10-21 – 2017-10-22 (×3): 5 mg via ORAL
  Filled 2017-10-21 (×3): qty 1

## 2017-10-21 MED ORDER — ACETAMINOPHEN 325 MG PO TABS
650.0000 mg | ORAL_TABLET | Freq: Four times a day (QID) | ORAL | Status: DC | PRN
  Administered 2017-10-21 – 2017-10-23 (×2): 650 mg via ORAL
  Filled 2017-10-21 (×2): qty 2

## 2017-10-21 MED ORDER — METHYLPREDNISOLONE SODIUM SUCC 125 MG IJ SOLR
60.0000 mg | Freq: Two times a day (BID) | INTRAMUSCULAR | Status: DC
  Administered 2017-10-21 – 2017-10-22 (×3): 60 mg via INTRAVENOUS
  Filled 2017-10-21 (×3): qty 2

## 2017-10-21 MED ORDER — IPRATROPIUM-ALBUTEROL 0.5-2.5 (3) MG/3ML IN SOLN
3.0000 mL | Freq: Four times a day (QID) | RESPIRATORY_TRACT | Status: DC
  Administered 2017-10-21 – 2017-10-23 (×9): 3 mL via RESPIRATORY_TRACT
  Filled 2017-10-21 (×9): qty 3

## 2017-10-21 NOTE — Progress Notes (Signed)
PROGRESS NOTE    Kathleen Rodriguez  WUJ:[id number] DOB: 1966-06-26 DOA: 10/20/2017 PCP: Jaclyn ShaggyAmao, [name], MD  Brief Narrative:Kathleen Rodriguez  is a 51 y.o. female, w Asthma, apparently has had cough w yellow sputum for the past 2 days along with dyspnea and wheezing.  Pt tried albuterol at home without relief and therefore presented to ED for evaluation Assessment & Plan:   Principal Problem:   Asthma exacerbation -Clinically improving however not back to baseline yet, still wheezing at this time -Continue IV Solu-Medrol with a down dose, continue azithromycin -We'll check influenza PCR -Chest x-ray without acute findings -continue duonebs  Obesity -Lifestyle modification advised  High blood press ure -Possibly related to current steroids, not on medications at home, monitor  GERD -Continue H2 blocker when necessary  Sinus tachycardia -Due to asthma exacerbation and nebulizations, monitor  DVT prophylaxis: Lovenox Code Status: Full code Family Communication: No family at bedside Disposition Plan: Home tomorrow if improving  Consultants:   None   Procedures:   Antimicrobials: Azithromycin 12/28   Subjective: -Breathing better still has some chest tightness and wheezing, not back to baseline  Objective: Vitals:   10/20/17 2134 10/21/17 0500 10/21/17 0510 10/21/17 0821  BP: 124/63  (!) 150/68   Pulse: (!) 113  (!) 107   Resp: 16  16   Temp: 97.8 F (36.6 C)  98.6 F (37 C)   TempSrc: Oral  Oral   SpO2: 95%  93% 91%  Weight:  120.2 kg (265 lb)      Intake/Output Summary (Last 24 hours) at 10/21/2017 1224 Last data filed at 10/21/2017 0900 Gross per 24 hour  Intake 610 ml  Output -  Net 610 ml   Filed Weights   10/21/17 0500  Weight: 120.2 kg (265 lb)    Examination:  General exam: Appears calm and comfortable, no distress Respiratory system: Poor air movement and scattered wheezes throughout Cardiovascular system: S1 & S2 heard, RRR.  Tachycardic Gastrointestinal system: Abdomen is nondistended, soft and nontender.Normal bowel sounds heard. Central nervous system: Alert and oriented. No focal neurological deficits. Extremities: Symmetric 5 x 5 power. Skin: No rashes, lesions or ulcers Psychiatry: Judgement and insight appear normal. Mood & affect appropriate.     Data Reviewed:   CBC: Recent Labs  Lab 10/20/17 1352 10/21/17 0347  WBC 8.1 7.7  NEUTROABS 3.5  --   HGB 12.5 11.7*  HCT 38.7 36.7  MCV 84.7 84.4  PLT 298 300   Basic Metabolic Panel: Recent Labs  Lab 10/20/17 1352 10/21/17 0347  NA 139 138  K 3.8 4.0  CL 106 108  CO2 25 21*  GLUCOSE 101* 163*  BUN 9 16  CREATININE 0.97 1.02*  CALCIUM 8.3* 8.8*   GFR: Estimated Creatinine Clearance: 83.8 mL/min (A) (by C-G formula based on SCr of 1.02 mg/dL (H)). Liver Function Tests: Recent Labs  Lab 10/20/17 1352 10/21/17 0347  AST 19 26  ALT 21 23  ALKPHOS 48 50  BILITOT 0.5 0.3  PROT 6.1* 6.1*  ALBUMIN 3.1* 3.1*   No results for input(s): LIPASE, AMYLASE in the last 168 hours. No results for input(s): AMMONIA in the last 168 hours. Coagulation Profile: No results for input(s): INR, PROTIME in the last 168 hours. Cardiac Enzymes: Recent Labs  Lab 10/20/17 1817  TROPONINI <0.03   BNP (last 3 results) No results for input(s): PROBNP in the last 8760 hours. HbA1C: No results for input(s): HGBA1C in the last 72 hours. CBG: No results for  MariaCoud409 69[facility] CampusEXTScot JunTTAG>oodJoaquin Courts7 [id number]

## 2017-10-22 DIAGNOSIS — G4733 Obstructive sleep apnea (adult) (pediatric): Secondary | ICD-10-CM | POA: Diagnosis present

## 2017-10-22 DIAGNOSIS — R000 Tachycardia, unspecified: Secondary | ICD-10-CM | POA: Diagnosis present

## 2017-10-22 DIAGNOSIS — T380X5A Adverse effect of glucocorticoids and synthetic analogues, initial encounter: Secondary | ICD-10-CM | POA: Diagnosis not present

## 2017-10-22 DIAGNOSIS — J45909 Unspecified asthma, uncomplicated: Secondary | ICD-10-CM | POA: Diagnosis present

## 2017-10-22 DIAGNOSIS — Z23 Encounter for immunization: Secondary | ICD-10-CM | POA: Diagnosis not present

## 2017-10-22 DIAGNOSIS — Z6841 Body Mass Index (BMI) 40.0 and over, adult: Secondary | ICD-10-CM | POA: Diagnosis not present

## 2017-10-22 DIAGNOSIS — Z8249 Family history of ischemic heart disease and other diseases of the circulatory system: Secondary | ICD-10-CM | POA: Diagnosis not present

## 2017-10-22 DIAGNOSIS — Z915 Personal history of self-harm: Secondary | ICD-10-CM | POA: Diagnosis not present

## 2017-10-22 DIAGNOSIS — I10 Essential (primary) hypertension: Secondary | ICD-10-CM | POA: Diagnosis present

## 2017-10-22 DIAGNOSIS — E43 Unspecified severe protein-calorie malnutrition: Secondary | ICD-10-CM | POA: Diagnosis present

## 2017-10-22 DIAGNOSIS — Z91040 Latex allergy status: Secondary | ICD-10-CM | POA: Diagnosis not present

## 2017-10-22 DIAGNOSIS — R0902 Hypoxemia: Secondary | ICD-10-CM | POA: Diagnosis present

## 2017-10-22 DIAGNOSIS — K219 Gastro-esophageal reflux disease without esophagitis: Secondary | ICD-10-CM | POA: Diagnosis present

## 2017-10-22 DIAGNOSIS — E669 Obesity, unspecified: Secondary | ICD-10-CM | POA: Diagnosis present

## 2017-10-22 DIAGNOSIS — R0603 Acute respiratory distress: Secondary | ICD-10-CM | POA: Diagnosis present

## 2017-10-22 DIAGNOSIS — Z87891 Personal history of nicotine dependence: Secondary | ICD-10-CM | POA: Diagnosis not present

## 2017-10-22 DIAGNOSIS — R739 Hyperglycemia, unspecified: Secondary | ICD-10-CM | POA: Diagnosis present

## 2017-10-22 DIAGNOSIS — Z96652 Presence of left artificial knee joint: Secondary | ICD-10-CM | POA: Diagnosis present

## 2017-10-22 DIAGNOSIS — Z7951 Long term (current) use of inhaled steroids: Secondary | ICD-10-CM | POA: Diagnosis not present

## 2017-10-22 DIAGNOSIS — Z830 Family history of human immunodeficiency virus [HIV] disease: Secondary | ICD-10-CM | POA: Diagnosis not present

## 2017-10-22 DIAGNOSIS — Y92230 Patient room in hospital as the place of occurrence of the external cause: Secondary | ICD-10-CM | POA: Diagnosis not present

## 2017-10-22 DIAGNOSIS — Z79899 Other long term (current) drug therapy: Secondary | ICD-10-CM | POA: Diagnosis not present

## 2017-10-22 DIAGNOSIS — J45901 Unspecified asthma with (acute) exacerbation: Secondary | ICD-10-CM | POA: Diagnosis present

## 2017-10-22 DIAGNOSIS — Z91048 Other nonmedicinal substance allergy status: Secondary | ICD-10-CM | POA: Diagnosis not present

## 2017-10-22 DIAGNOSIS — Z91018 Allergy to other foods: Secondary | ICD-10-CM | POA: Diagnosis not present

## 2017-10-22 MED ORDER — AZITHROMYCIN 500 MG PO TABS
500.0000 mg | ORAL_TABLET | Freq: Every day | ORAL | Status: DC
  Administered 2017-10-22: 500 mg via ORAL
  Filled 2017-10-22: qty 1

## 2017-10-22 MED ORDER — FLUCONAZOLE 150 MG PO TABS
150.0000 mg | ORAL_TABLET | Freq: Once | ORAL | Status: AC
  Administered 2017-10-22: 150 mg via ORAL
  Filled 2017-10-22: qty 1

## 2017-10-22 NOTE — Plan of Care (Signed)
Progressing

## 2017-10-22 NOTE — Progress Notes (Signed)
PROGRESS NOTE    [name]  WJX:[id number] DOB: [birth year] DOA: 10/20/2017 PCP: Jaclyn ShaggyAmao, [name], MD  Brief Narrative:Kathleen Rodriguez  is a 51 y.o. female, w Asthma, apparently has had cough w yellow sputum for the past 2 days along with dyspnea and wheezing.  Pt tried albuterol at home without relief and therefore presented to ED for evaluation  Assessment & Plan:    Asthma exacerbation -Clinically improving slowly -Still wheezing and has very poor air movement -Continue IV Solu-Medrol 1 more day, continue azithromycin -Chest x-ray without acute findings and influenza PCR negative - continue duonebs  Obesity -Lifestyle modification advised  High blood press ure -Possibly related to current steroids, not on medications at home, monitor  GERD -Continue H2 blocker when necessary  Sinus tachycardia -Due to asthma exacerbation and nebulizations, monitor  Hyperglycemia secondary to steroids -Monitor  DVT prophylaxis: Lovenox Code Status: Full code Family Communication: No family at bedside Disposition Plan: Home tomorrow if improving  Consultants:   None   Procedures:   Antimicrobials: Azithromycin 12/28   Subjective: -Breathing better still has some chest tightness and wheezing, not back to baseline  Objective: Vitals:   10/21/17 2137 10/22/17 0213 10/22/17 0539 10/22/17 0721  BP: (!) 128/54  134/66   Pulse: (!) 117  95   Resp: 20  18   Temp: 98.1 F (36.7 C)  98.1 F (36.7 C)   TempSrc: Oral  Tympanic   SpO2: 95% 96% 96% 91%  Weight:   121.4 kg (267 lb 9.6 oz)     Intake/Output Summary (Last 24 hours) at 10/22/2017 1210 Last data filed at 10/22/2017 1006 Gross per 24 hour  Intake 1120 ml  Output 200 ml  Net 920 ml   Filed Weights   10/21/17 0500 10/22/17 0539  Weight: 120.2 kg (265 lb) 121.4 kg (267 lb 9.6 oz)    Examination:  Gen: Awake, Alert, Oriented X 3,  HEENT: PERRLA, Neck supple, no JVD Lungs: Very poor air movement, scattered  eccentrically wheezes CVS: S1-S2/regular rate rhythm, tachycardic Abd: soft, Non tender, non distended, BS present Extremities: No Cyanosis, Clubbing or edema Skin: no new rashes Psychiatry: Judgement and insight appear normal. Mood & affect appropriate.     Data Reviewed:   CBC: Recent Labs  Lab 10/20/17 1352 10/21/17 0347  WBC 8.1 7.7  NEUTROABS 3.5  --   HGB 12.5 11.7*  HCT 38.7 36.7  MCV 84.7 84.4  PLT 298 300   Basic Metabolic Panel: Recent Labs  Lab 10/20/17 1352 10/21/17 0347  NA 139 138  K 3.8 4.0  CL 106 108  CO2 25 21*  GLUCOSE 101* 163*  BUN 9 16  CREATININE 0.97 1.02*  CALCIUM 8.3* 8.8*   GFR: Estimated Creatinine Clearance: 84.3 mL/min (A) (by C-G formula based on SCr of 1.02 mg/dL (H)). Liver Function Tests: Recent Labs  Lab 10/20/17 1352 10/21/17 0347  AST 19 26  ALT 21 23  ALKPHOS 48 50  BILITOT 0.5 0.3  PROT 6.1* 6.1*  ALBUMIN 3.1* 3.1*   No results for input(s): LIPASE, AMYLASE in the last 168 hours. No results for input(s): AMMONIA in the last 168 hours. Coagulation Profile: No results for input(s): INR, PROTIME in the last 168 hours. Cardiac Enzymes: Recent Labs  Lab 10/20/17 1817  TROPONINI <0.03   BNP (last 3 results) No results for input(s): PROBNP in the last 8760 hours. HbA1C: No results for input(s): HGBA1C in the last 72 hours. CBG: No results for input(s): GLUCAP  in the last 168 hours. Lipid Profile: No results for input(s): CHOL, HDL, LDLCALC, TRIG, CHOLHDL, LDLDIRECT in the last 72 hours. Thyroid Function Tests: No results for input(s): TSH, T4TOTAL, FREET4, T3FREE, THYROIDAB in the last 72 hours. Anemia Panel: No results for input(s): VITAMINB12, FOLATE, FERRITIN, TIBC, IRON, RETICCTPCT in the last 72 hours. Urine analysis:    Component Value Date/Time   COLORURINE YELLOW 07/09/2017 1453   APPEARANCEUR TURBID (A) 07/09/2017 1453   LABSPEC >1.030 (H) 07/09/2017 1453   PHURINE 5.5 07/09/2017 1453   GLUCOSEU  NEGATIVE 07/09/2017 1453   HGBUR SMALL (A) 07/09/2017 1453   BILIRUBINUR SMALL (A) 07/09/2017 1453   BILIRUBINUR small 05/26/2017 1539   KETONESUR 15 (A) 07/09/2017 1453   PROTEINUR TRACE (A) 07/09/2017 1453   UROBILINOGEN 1.0 05/26/2017 1539   UROBILINOGEN 1.0 03/22/2014 0805   NITRITE NEGATIVE 07/09/2017 1453   LEUKOCYTESUR NEGATIVE 07/09/2017 1453   Sepsis Labs: @LABRCNTIP (procalcitonin:4,lacticidven:4)  )No results found for this or any previous visit (from the past 240 hour(s)).       Radiology Studies: Dg Chest Portable 1 View  Result Date: 10/20/2017 CLINICAL DATA:  Shortness of Breath EXAM: PORTABLE CHEST 1 VIEW COMPARISON:  07/09/2017 FINDINGS: The heart size and mediastinal contours are within normal limits. Both lungs are clear. The visualized skeletal structures are unremarkable. IMPRESSION: No active disease. Electronically Signed   By: Alcide CleverMark  Lukens M.D.   On: 10/20/2017 14:31        Scheduled Meds: . azithromycin  500 mg Oral Daily  . budesonide  0.25 mg Nebulization BID  . enoxaparin (LOVENOX) injection  40 mg Subcutaneous Q24H  . fluticasone  2 spray Each Nare Daily  . ipratropium-albuterol  3 mL Nebulization Q6H  . methylPREDNISolone (SOLU-MEDROL) injection  60 mg Intravenous Q12H  . montelukast  10 mg Oral QHS   Continuous Infusions:    LOS: 0 days    Time spent: 35min    Zannie CovePreetha Dolorez Jeffrey, MD [facility] Page via [email or web address], password [email or web address] After 7PM please contact night-coverage  10/22/2017, 12:10 PM

## 2017-10-22 NOTE — Progress Notes (Signed)
MD was notified of an abnormal smell in her urine along with itchiness and discomfort.

## 2017-10-23 MED ORDER — FLUCONAZOLE 100 MG PO TABS
100.0000 mg | ORAL_TABLET | Freq: Once | ORAL | Status: AC
  Administered 2017-10-23: 100 mg via ORAL
  Filled 2017-10-23: qty 1

## 2017-10-23 MED ORDER — AZITHROMYCIN 500 MG PO TABS: 500.0000 mg | ORAL_TABLET | ORAL | 0 refills | Status: DC

## 2017-10-23 MED ORDER — PREDNISONE 50 MG PO TABS
50.0000 mg | ORAL_TABLET | Freq: Every day | ORAL | Status: DC
  Administered 2017-10-23: 50 mg via ORAL
  Filled 2017-10-23: qty 1

## 2017-10-23 MED ORDER — AZITHROMYCIN 500 MG PO TABS
500.0000 mg | ORAL_TABLET | ORAL | Status: DC
  Administered 2017-10-23: 500 mg via ORAL
  Filled 2017-10-23: qty 1

## 2017-10-23 MED ORDER — PREDNISONE 20 MG PO TABS: ORAL_TABLET | ORAL | 0 refills | Status: DC

## 2017-10-23 MED ORDER — METHOCARBAMOL 500 MG PO TABS: 500.0000 mg | ORAL_TABLET | Freq: Every day | ORAL | 0 refills | Status: DC | PRN

## 2017-10-23 NOTE — Telephone Encounter (Signed)
[facility] pt has been d/c from hosp. Melissa states nothing further is needed on our end.  Will close encounter.

## 2017-10-23 NOTE — Progress Notes (Signed)
Patient is discharged home. Discharge instructions were given to patient 

## 2017-10-23 NOTE — Telephone Encounter (Signed)
Tammy please advise. Thanks. 

## 2017-10-25 NOTE — Telephone Encounter (Signed)
I called for appeal assistance, [employer] sent a fax 10/09/17. Called Fri. Their office was closed. Today I get a fax saying the new forms may be acquired online. I called [employer] and the pt can't apply online. She will have to come in and sign the new forms. Pt. Just got out the hosp. Mon., She said she would come in sometime next wk. To fill out and sign papers.

## 2017-10-25 NOTE — Telephone Encounter (Signed)
Yes, this was taken care of, Holidays and time off, forgot to answer. (09/11/17) Sorry! Nothing further needed. Closing.

## 2017-11-01 NOTE — Discharge Summary (Addendum)
Physician Discharge Summary  Kathleen Rodriguez WUJ:[id number] DOB: October 08, 1966 DOA: 10/20/2017  PCP: Kathleen ShaggyAmao, [name], MD  Admit date: 10/20/2017 Discharge date: 10/23/2018  Time spent: 35 minutes  Recommendations for Outpatient Follow-up:  1. PCP in 1 week, please monitor her BP and check Hba1c at FU   Discharge Diagnoses:  Principal Problem:   Asthma exacerbation   Obesity   Severe Protein calorie malnutrition (HCC)   GERD (gastroesophageal reflux disease)   Essential hypertension   Hyperglycemia from steroids  Discharge Condition: stable  Diet recommendation: low sodium  Filed Weights   10/21/17 0500 10/22/17 0539 10/23/17 0548  Weight: 120.2 kg (265 lb) 121.4 kg (267 lb 9.6 oz) 119.9 kg (264 lb 4.8 oz)    History of present illness:  :LouiseJonesis a51 y.o.female,w Asthma, apparently has had cough w yellow sputum for the past 2 days along with dyspnea and wheezing. Pt tried albuterol at home without relief and therefore presented to ED for evaluation  Hospital Course:    Asthma exacerbation -Clinically improved slowly, treated with Iv steroids, Abx, nebs -Chest x-ray without acute findings and influenza PCR negative -discharged home on prednisone taper and Abx -weaned off O2  Obesity -Lifestyle modification advised  High blood press ure -Possibly related to current steroids, not on medications at home, monitor  GERD -Continue H2 blocker when necessary  Sinus tachycardia -Due to asthma exacerbation and nebulizations, improved  Hyperglycemia secondary to steroids -improved when weaned  Severe protein calorie malnutrition -protein supplements advised   Discharge Exam: Vitals:   10/23/17 0137 10/23/17 0548  BP:  (!) 143/82  Pulse:  88  Resp:  18  Temp:  97.6 F (36.4 C)  SpO2: 98% 96%    General: AAOx3 Cardiovascular: S1S2/RRR Respiratory: CTAB  Discharge Instructions   Discharge Instructions    Diet - low sodium heart healthy    Complete by:  As directed    Increase activity slowly   Complete by:  As directed      Allergies as of 10/23/2017      Reactions   Tomato Hives, Itching, Other (See Comments)   ALSO REACTS TO KETCHUP   Latex Itching, Rash   Wool Alcohol [lanolin] Itching      Medication List    STOP taking these medications   dextromethorphan 30 MG/5ML liquid Commonly known as:  DELSYM     TAKE these medications   acetaminophen 500 MG tablet Commonly known as:  TYLENOL Take 1,000 mg by mouth every 6 (six) hours as needed for mild pain.   albuterol 108 (90 Base) MCG/ACT inhaler Commonly known as:  PROAIR HFA Inhale 2 puffs every 6 (six) hours as needed into the lungs for wheezing or shortness of breath.   azithromycin 500 MG tablet Commonly known as:  ZITHROMAX Take 1 tablet (500 mg total) by mouth daily. For 2days   benzonatate 100 MG capsule Commonly known as:  TESSALON Take 1 capsule (100 mg total) by mouth 3 (three) times daily as needed for cough.   budesonide 0.25 MG/2ML nebulizer solution Commonly known as:  PULMICORT Take 2 mLs (0.25 mg total) 2 (two) times daily by nebulization.   fluticasone 50 MCG/ACT nasal spray Commonly known as:  FLONASE Place 2 sprays daily into both nostrils.   methocarbamol 500 MG tablet Commonly known as:  ROBAXIN Take 1 tablet (500 mg total) by mouth daily as needed for muscle spasms.   montelukast 10 MG tablet Commonly known as:  SINGULAIR Take 1 tablet (10 mg total)  at bedtime by mouth.   MUCUS RELIEF PO Take 1 tablet by mouth 2 (two) times daily.   predniSONE 20 MG tablet Commonly known as:  DELTASONE Take 40mg  for 2days then 20mg  for 2days then STOP What changed:    medication strength  additional instructions   triamcinolone cream 0.1 % Commonly known as:  KENALOG Apply 1 application topically 2 (two) times daily.      Allergies  Allergen Reactions  . Tomato Hives, Itching and Other (See Comments)    ALSO REACTS TO  KETCHUP  . Latex Itching and Rash  . Wool Alcohol [Lanolin] Itching   Follow-up Information    Kathleen Shaggy, MD. Schedule an appointment as soon as possible for a visit in 1 week(s).   Specialty:  Family Medicine Contact information: [address] [place] [address] [phone]            The results of significant diagnostics from this hospitalization (including imaging, microbiology, ancillary and laboratory) are listed below for reference.    Significant Diagnostic Studies: Dg Chest Portable 1 View  Result Date: 10/20/2017 CLINICAL DATA:  Shortness of Breath EXAM: PORTABLE CHEST 1 VIEW COMPARISON:  07/09/2017 FINDINGS: The heart size and mediastinal contours are within normal limits. Both lungs are clear. The visualized skeletal structures are unremarkable. IMPRESSION: No active disease. Electronically Signed   By: Alcide Clever M.D.   On: 10/20/2017 14:31    Microbiology: No results found for this or any previous visit (from the past 240 hour(s)).   Labs: Basic Metabolic Panel: No results for input(s): NA, K, CL, CO2, GLUCOSE, BUN, CREATININE, CALCIUM, MG, PHOS in the last 168 hours. Liver Function Tests: No results for input(s): AST, ALT, ALKPHOS, BILITOT, PROT, ALBUMIN in the last 168 hours. No results for input(s): LIPASE, AMYLASE in the last 168 hours. No results for input(s): AMMONIA in the last 168 hours. CBC: No results for input(s): WBC, NEUTROABS, HGB, HCT, MCV, PLT in the last 168 hours. Cardiac Enzymes: No results for input(s): CKTOTAL, CKMB, CKMBINDEX, TROPONINI in the last 168 hours. BNP: BNP (last 3 results) Recent Labs    07/09/17 1043 10/20/17 1352  BNP 27.2 14.3    ProBNP (last 3 results) No results for input(s): PROBNP in the last 8760 hours.  CBG: No results for input(s): GLUCAP in the last 168 hours.     Signed:  Zannie Cove MD.  [facility] 11/01/2017, 3:20 PM

## 2017-11-15 ENCOUNTER — Ambulatory Visit: Payer: Medicaid Other | Admitting: Family Medicine

## 2017-11-23 ENCOUNTER — Telehealth: Payer: Self-pay | Admitting: Pulmonary Disease

## 2017-11-23 NOTE — Telephone Encounter (Signed)
Called husband to discuss. Left message for husband to call back.

## 2017-11-24 ENCOUNTER — Telehealth: Payer: Self-pay | Admitting: Family Medicine

## 2017-11-24 NOTE — Telephone Encounter (Signed)
Brought pt's husband, Kathleen Rodriguez,  back from the lobby to look at the forms he had brought [place].   Per Kathleen Rodriguez pt received a check of $2,313 x1week ago which is already gone. Pt then received a check today of $750 that he stated is already gone.  Pt is to be receiving a check of $12,406 at any time that he believes will be gone as soon as pt receives the money.  Kathleen Rodriguez stated to me that pt has been missing [id number] and a police report was made today, 11/24/17.  Kathleen Rodriguez has met with social security regarding the pt's money issue and per [employer], a letter needs to be written stating pt is uncompetent to manage her money and husband needs to take over managing pt's money due to all that has happened with previous checks.  Discussed with Maryann Conners and Clayborne Dana who both stated that none of our doctors would write a letter for Kathleen Rodriguez due to this not being a pulmonary issue and Kathleen Rodriguez needs to go back to the police station letting them know the information that has been happening with the money and if pt is found, she could be taken to be evaluated.  Called Kathleen Rodriguez and stated the above information letting him know what was said per TD and KW.  Kathleen Rodriguez expressed understanding and said he would tell this to social security and then would go from there about going back to the police station.  Nothing further needed at this current time.

## 2017-11-24 NOTE — Telephone Encounter (Signed)
Patient husband called stating that he has an appointment with DSS and needs a letter so that he can be her payee because she is a drug addict and she has used so much until she's in the hospital now.

## 2017-11-24 NOTE — Telephone Encounter (Signed)
Husband Thurmond Butts([place]) is waiting in the lobby; would like to speak to nurse; states have paperwork to speak to nurse about

## 2017-11-27 NOTE — Telephone Encounter (Signed)
This can only be done in the presence of the patient.  I will need both of them to come in for an office visit.

## 2017-11-29 ENCOUNTER — Telehealth: Payer: Self-pay | Admitting: Pulmonary Disease

## 2017-11-29 NOTE — Telephone Encounter (Signed)
Called patient, when patient answered the phone she stated that she could not talk due to being out of breath. Patient was difficult to understand, she was gasping and trying to catch her breath, instead of getting information on how patient felt we advised patient to go ahead and call 911 and head to the hospital. Patient states that she would. Nothing further needed. Routing to RA for FYI.

## 2017-11-29 NOTE — Telephone Encounter (Signed)
Okay 

## 2017-11-30 ENCOUNTER — Encounter (HOSPITAL_COMMUNITY): Payer: Self-pay | Admitting: Emergency Medicine

## 2017-11-30 ENCOUNTER — Telehealth: Payer: Self-pay | Admitting: Pulmonary Disease

## 2017-11-30 ENCOUNTER — Inpatient Hospital Stay (HOSPITAL_COMMUNITY)
Admission: EM | Admit: 2017-11-30 | Discharge: 2017-12-02 | DRG: 202 | Disposition: A | Discharge status: Home / Self Care | Payer: Medicaid Other | Attending: Internal Medicine | Admitting: Internal Medicine

## 2017-11-30 ENCOUNTER — Emergency Department (HOSPITAL_COMMUNITY): Payer: Medicaid Other

## 2017-11-30 DIAGNOSIS — J4552 Severe persistent asthma with status asthmaticus: Principal | ICD-10-CM

## 2017-11-30 DIAGNOSIS — K219 Gastro-esophageal reflux disease without esophagitis: Secondary | ICD-10-CM | POA: Diagnosis present

## 2017-11-30 DIAGNOSIS — Z91018 Allergy to other foods: Secondary | ICD-10-CM

## 2017-11-30 DIAGNOSIS — J209 Acute bronchitis, unspecified: Secondary | ICD-10-CM | POA: Diagnosis present

## 2017-11-30 DIAGNOSIS — R0602 Shortness of breath: Secondary | ICD-10-CM | POA: Diagnosis not present

## 2017-11-30 DIAGNOSIS — Z8249 Family history of ischemic heart disease and other diseases of the circulatory system: Secondary | ICD-10-CM

## 2017-11-30 DIAGNOSIS — Z96652 Presence of left artificial knee joint: Secondary | ICD-10-CM | POA: Diagnosis present

## 2017-11-30 DIAGNOSIS — E7439 Other disorders of intestinal carbohydrate absorption: Secondary | ICD-10-CM | POA: Diagnosis present

## 2017-11-30 DIAGNOSIS — Z830 Family history of human immunodeficiency virus [HIV] disease: Secondary | ICD-10-CM

## 2017-11-30 DIAGNOSIS — Y92009 Unspecified place in unspecified non-institutional (private) residence as the place of occurrence of the external cause: Secondary | ICD-10-CM

## 2017-11-30 DIAGNOSIS — M19072 Primary osteoarthritis, left ankle and foot: Secondary | ICD-10-CM | POA: Diagnosis present

## 2017-11-30 DIAGNOSIS — F1410 Cocaine abuse, uncomplicated: Secondary | ICD-10-CM | POA: Diagnosis present

## 2017-11-30 DIAGNOSIS — M170 Bilateral primary osteoarthritis of knee: Secondary | ICD-10-CM | POA: Diagnosis present

## 2017-11-30 DIAGNOSIS — Z79899 Other long term (current) drug therapy: Secondary | ICD-10-CM

## 2017-11-30 DIAGNOSIS — R739 Hyperglycemia, unspecified: Secondary | ICD-10-CM | POA: Diagnosis present

## 2017-11-30 DIAGNOSIS — T380X5A Adverse effect of glucocorticoids and synthetic analogues, initial encounter: Secondary | ICD-10-CM | POA: Diagnosis present

## 2017-11-30 DIAGNOSIS — Z6841 Body Mass Index (BMI) 40.0 and over, adult: Secondary | ICD-10-CM

## 2017-11-30 DIAGNOSIS — I10 Essential (primary) hypertension: Secondary | ICD-10-CM | POA: Diagnosis present

## 2017-11-30 DIAGNOSIS — F17210 Nicotine dependence, cigarettes, uncomplicated: Secondary | ICD-10-CM | POA: Diagnosis present

## 2017-11-30 DIAGNOSIS — Z716 Tobacco abuse counseling: Secondary | ICD-10-CM

## 2017-11-30 DIAGNOSIS — Z91048 Other nonmedicinal substance allergy status: Secondary | ICD-10-CM

## 2017-11-30 DIAGNOSIS — J101 Influenza due to other identified influenza virus with other respiratory manifestations: Secondary | ICD-10-CM | POA: Diagnosis present

## 2017-11-30 DIAGNOSIS — J9601 Acute respiratory failure with hypoxia: Secondary | ICD-10-CM | POA: Diagnosis present

## 2017-11-30 DIAGNOSIS — Z7951 Long term (current) use of inhaled steroids: Secondary | ICD-10-CM

## 2017-11-30 DIAGNOSIS — J45902 Unspecified asthma with status asthmaticus: Secondary | ICD-10-CM | POA: Diagnosis present

## 2017-11-30 DIAGNOSIS — M19071 Primary osteoarthritis, right ankle and foot: Secondary | ICD-10-CM | POA: Diagnosis present

## 2017-11-30 DIAGNOSIS — D509 Iron deficiency anemia, unspecified: Secondary | ICD-10-CM | POA: Diagnosis present

## 2017-11-30 DIAGNOSIS — Z91040 Latex allergy status: Secondary | ICD-10-CM

## 2017-11-30 DIAGNOSIS — G4733 Obstructive sleep apnea (adult) (pediatric): Secondary | ICD-10-CM | POA: Diagnosis present

## 2017-11-30 DIAGNOSIS — E876 Hypokalemia: Secondary | ICD-10-CM | POA: Diagnosis present

## 2017-11-30 LAB — BASIC METABOLIC PANEL
Anion gap: 11 (ref 5–15)
Anion gap: 11 (ref 5–15)
BUN: 11 mg/dL (ref 6–20)
BUN: 11 mg/dL (ref 6–20)
CALCIUM: 7.9 mg/dL — AB (ref 8.9–10.3)
CHLORIDE: 107 mmol/L (ref 101–111)
CHLORIDE: 107 mmol/L (ref 101–111)
CO2: 25 mmol/L (ref 22–32)
CO2: 20 mmol/L — AB (ref 22–32)
CREATININE: 0.83 mg/dL (ref 0.44–1.00)
Calcium: 8.1 mg/dL — ABNORMAL LOW (ref 8.9–10.3)
Creatinine, Ser: 0.8 mg/dL (ref 0.44–1.00)
GFR calc Af Amer: >60 mL/min (ref >60)
GFR calc non Af Amer: >60 mL/min (ref >60)
GFR calc non Af Amer: >60 mL/min (ref >60)
Glucose, Bld: 127 mg/dL — ABNORMAL HIGH (ref 65–99)
Glucose, Bld: 281 mg/dL — ABNORMAL HIGH (ref 65–99)
POTASSIUM: 3.3 mmol/L — AB (ref 3.5–5.1)
Potassium: 3.4 mmol/L — ABNORMAL LOW (ref 3.5–5.1)
SODIUM: 143 mmol/L (ref 135–145)
Sodium: 138 mmol/L (ref 135–145)

## 2017-11-30 LAB — CBC
HCT: 33.1 % — ABNORMAL LOW (ref 36.0–46.0)
HEMOGLOBIN: 10.1 g/dL — AB (ref 12.0–15.0)
MCH: 26.4 pg (ref 26.0–34.0)
MCHC: 30.5 g/dL (ref 30.0–36.0)
MCV: 86.4 fL (ref 78.0–100.0)
PLATELETS: 367 10*3/uL (ref 150–400)
RBC: 3.83 MIL/uL — AB (ref 3.87–5.11)
RDW: 18.7 % — ABNORMAL HIGH (ref 11.5–15.5)
WBC: 14.4 10*3/uL — ABNORMAL HIGH (ref 4.0–10.5)

## 2017-11-30 LAB — CBC WITH DIFFERENTIAL/PLATELET
Basophils Absolute: 0 10*3/uL (ref 0.0–0.1)
Basophils Relative: 0 %
Eosinophils Absolute: 0 10*3/uL (ref 0.0–0.7)
Eosinophils Relative: 0 %
HCT: 37.1 % (ref 36.0–46.0)
HEMOGLOBIN: 11.6 g/dL — AB (ref 12.0–15.0)
LYMPHS ABS: 2.6 10*3/uL (ref 0.7–4.0)
LYMPHS PCT: 19 %
MCH: 26.7 pg (ref 26.0–34.0)
MCHC: 31.3 g/dL (ref 30.0–36.0)
MCV: 85.5 fL (ref 78.0–100.0)
Monocytes Absolute: 1.3 10*3/uL — ABNORMAL HIGH (ref 0.1–1.0)
Monocytes Relative: 9 %
NEUTROS PCT: 72 %
Neutro Abs: 9.6 10*3/uL — ABNORMAL HIGH (ref 1.7–7.7)
Platelets: 393 10*3/uL (ref 150–400)
RBC: 4.34 MIL/uL (ref 3.87–5.11)
RDW: 18.5 % — ABNORMAL HIGH (ref 11.5–15.5)
WBC: 13.4 10*3/uL — AB (ref 4.0–10.5)

## 2017-11-30 LAB — RAPID URINE DRUG SCREEN, HOSP PERFORMED
AMPHETAMINES: NOT DETECTED
BENZODIAZEPINES: NOT DETECTED
Barbiturates: NOT DETECTED
COCAINE: POSITIVE — AB
Opiates: NOT DETECTED
Tetrahydrocannabinol: NOT DETECTED

## 2017-11-30 LAB — EXPECTORATED SPUTUM ASSESSMENT W GRAM STAIN, RFLX TO RESP C

## 2017-11-30 LAB — BRAIN NATRIURETIC PEPTIDE: B NATRIURETIC PEPTIDE 5: 97 pg/mL (ref 0.0–100.0)

## 2017-11-30 LAB — INFLUENZA PANEL BY PCR (TYPE A & B)
Influenza A By PCR: NEGATIVE
Influenza B By PCR: NEGATIVE

## 2017-11-30 LAB — EXPECTORATED SPUTUM ASSESSMENT W REFEX TO RESP CULTURE

## 2017-11-30 LAB — TROPONIN I

## 2017-11-30 MED ORDER — METHYLPREDNISOLONE SODIUM SUCC 40 MG IJ SOLR
40.0000 mg | Freq: Two times a day (BID) | INTRAMUSCULAR | Status: DC
  Administered 2017-11-30 – 2017-12-02 (×5): 40 mg via INTRAVENOUS
  Filled 2017-11-30 (×5): qty 1

## 2017-11-30 MED ORDER — ACETAMINOPHEN 325 MG PO TABS
650.0000 mg | ORAL_TABLET | Freq: Four times a day (QID) | ORAL | Status: DC | PRN
  Administered 2017-11-30: 650 mg via ORAL
  Filled 2017-11-30: qty 2

## 2017-11-30 MED ORDER — GUAIFENESIN ER 600 MG PO TB12
600.0000 mg | ORAL_TABLET | Freq: Two times a day (BID) | ORAL | Status: DC
  Administered 2017-11-30 – 2017-12-02 (×5): 600 mg via ORAL
  Filled 2017-11-30 (×5): qty 1

## 2017-11-30 MED ORDER — KETOROLAC TROMETHAMINE 15 MG/ML IJ SOLN
15.0000 mg | Freq: Once | INTRAMUSCULAR | Status: AC
  Administered 2017-11-30: 15 mg via INTRAVENOUS
  Filled 2017-11-30: qty 1

## 2017-11-30 MED ORDER — ONDANSETRON HCL 4 MG/2ML IJ SOLN: 4.0000 mg | Freq: Four times a day (QID) | INTRAMUSCULAR | Status: DC | PRN

## 2017-11-30 MED ORDER — AZITHROMYCIN 500 MG IV SOLR
500.0000 mg | INTRAVENOUS | Status: DC
  Administered 2017-12-01: 500 mg via INTRAVENOUS
  Filled 2017-11-30: qty 500

## 2017-11-30 MED ORDER — ENOXAPARIN SODIUM 40 MG/0.4ML ~~LOC~~ SOLN
40.0000 mg | SUBCUTANEOUS | Status: DC
  Filled 2017-11-30: qty 0.4

## 2017-11-30 MED ORDER — DEXTROSE 5 % IV SOLN
500.0000 mg | Freq: Once | INTRAVENOUS | Status: AC
  Administered 2017-11-30: 500 mg via INTRAVENOUS
  Filled 2017-11-30: qty 500

## 2017-11-30 MED ORDER — POTASSIUM CHLORIDE CRYS ER 20 MEQ PO TBCR
40.0000 meq | EXTENDED_RELEASE_TABLET | Freq: Once | ORAL | Status: AC
  Administered 2017-11-30: 40 meq via ORAL
  Filled 2017-11-30: qty 2

## 2017-11-30 MED ORDER — BUDESONIDE 0.25 MG/2ML IN SUSP
0.2500 mg | Freq: Two times a day (BID) | RESPIRATORY_TRACT | Status: DC
  Administered 2017-11-30 – 2017-12-02 (×5): 0.25 mg via RESPIRATORY_TRACT
  Filled 2017-11-30 (×5): qty 2

## 2017-11-30 MED ORDER — ONDANSETRON HCL 4 MG PO TABS: 4.0000 mg | ORAL_TABLET | Freq: Four times a day (QID) | ORAL | Status: DC | PRN

## 2017-11-30 MED ORDER — ALBUTEROL SULFATE (2.5 MG/3ML) 0.083% IN NEBU
5.0000 mg | INHALATION_SOLUTION | Freq: Once | RESPIRATORY_TRACT | Status: AC
  Administered 2017-11-30: 5 mg via RESPIRATORY_TRACT
  Filled 2017-11-30: qty 6

## 2017-11-30 MED ORDER — ACETAMINOPHEN 650 MG RE SUPP: 650.0000 mg | Freq: Four times a day (QID) | RECTAL | Status: DC | PRN

## 2017-11-30 MED ORDER — ALBUTEROL SULFATE (2.5 MG/3ML) 0.083% IN NEBU: 2.5000 mg | INHALATION_SOLUTION | RESPIRATORY_TRACT | Status: DC | PRN

## 2017-11-30 MED ORDER — ALBUTEROL (5 MG/ML) CONTINUOUS INHALATION SOLN
10.0000 mg/h | INHALATION_SOLUTION | Freq: Once | RESPIRATORY_TRACT | Status: AC
  Administered 2017-11-30: 10 mg/h via RESPIRATORY_TRACT
  Filled 2017-11-30: qty 20

## 2017-11-30 MED ORDER — METHOCARBAMOL 500 MG PO TABS
750.0000 mg | ORAL_TABLET | Freq: Once | ORAL | Status: AC | PRN
  Administered 2017-11-30: 750 mg via ORAL
  Filled 2017-11-30: qty 2

## 2017-11-30 MED ORDER — ALBUTEROL SULFATE (2.5 MG/3ML) 0.083% IN NEBU
2.5000 mg | INHALATION_SOLUTION | RESPIRATORY_TRACT | Status: DC
  Administered 2017-11-30 – 2017-12-01 (×9): 2.5 mg via RESPIRATORY_TRACT
  Filled 2017-11-30 (×9): qty 3

## 2017-11-30 NOTE — ED Triage Notes (Addendum)
  Patient brought to ED via EMS for SOB and chest tightness for 3 days.  Patient anxious and in respiratory distress on arrival.  EDP and RT called to bedside.  Patient presents with productive cough and expiratory wheezing.  SPO2 98% on RA.  History of asthma and patient was intubated in 2016 after asthma attack.  Patient is alert and oriented x 4.

## 2017-11-30 NOTE — ED Notes (Signed)
Per Dr. Bebe ShaggyWickline remove pt from bipap. Pt is comfortably resting in bed. Pt sats 100% RA.

## 2017-11-30 NOTE — ED Notes (Signed)
Admitting MD at bedside with patient

## 2017-11-30 NOTE — ED Provider Notes (Signed)
[facility] Provider Note   CSN: [id number] Arrival date & time:        History   Chief Complaint Chief Complaint  Patient presents with  . Shortness of Breath   Level 5 caveat due to acuity of condition HPI Kathleen Rodriguez is a 52 y.o. female.  The history is provided by the patient and the EMS personnel. The history is limited by the condition of the patient.  Shortness of Breath  This is a new problem. The average episode lasts 3 days. The problem occurs continuously.The problem has been gradually worsening. Associated symptoms include cough and wheezing. Associated medical issues include asthma.   Patient with history of asthma presents with shortness of breath.  This is been going on for 3 days.  She reports increased cough.  She reports chest tightness due to cough.  She was brought by EMS, was given albuterol, Atrovent, Solu-Medrol, magnesium.  Patient does have a history of intubation previously due to asthma Past Medical History:  Diagnosis Date  . Arthritis    "knees, lower back; legs, ankles" (01/27/2016)  . Asthma    followed by Dr. Craige CottaSood  . CHF (congestive heart failure) (HCC) 2016   "when I went into a coma"  . Cocaine abuse (HCC)   . 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 Dyspnea   . GERD (gastroesophageal reflux disease)   . Hypertension    "doctor took me off RX in 2016" (01/27/2016)  . Influenza B April 2014   Complicated by multi-organ failure  . OSA on CPAP "since " 03/20/2013  . Pneumonia 2016  . Required emergent intubation    asthma exacerbation in 2016  . Tobacco abuse   . Upper airway cough syndrome     Patient Active Problem List   Diagnosis Date Noted  . Asthma 10/22/2017  . Leukocytosis 07/11/2017  . Acute bronchitis   . Hypokalemia 07/10/2017  . Eczema 05/26/2017  . Osteoarthritis of left knee 09/01/2016  . 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 Knee pain, 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  . Status  asthmaticus 06/24/2016  . Acute respiratory failure (HCC) 06/24/2016  . Acute asthma exacerbation 05/12/2016  . Acute respiratory failure with hypoxia (HCC) 05/12/2016  . Tobacco abuse   . Primary osteoarthritis of left knee 03/09/2016  . Asthma with status asthmaticus 01/27/2016  . Acute respiratory distress 01/27/2016  . Non compliance with medical treatment 08/19/2015  . Anemia, iron deficiency 08/12/2015  . Essential hypertension 07/25/2015  . Sinus tachycardia (HCC) 07/25/2015  . Acute respiratory failure with hypercapnia (HCC) 07/24/2015  . COPD (chronic obstructive pulmonary disease) (HCC) 12/28/2014  . Tracheobronchitis 12/28/2014  . Dysfunctional uterine bleeding 12/28/2014  . Anemia 12/28/2014  . Pleuritic chest pain 05/16/2014  . Morbid obesity (HCC) 05/16/2014  . Chronic cough 05/01/2014  . Upper airway cough syndrome 05/01/2014  . Vaginal candidiasis 04/09/2014  . GERD (gastroesophageal reflux disease) 04/08/2014  . Medically noncompliant 04/07/2014  . Asthma exacerbation 04/06/2014  . CAP (community acquired pneumonia) 04/06/2014  . Chest pain 04/01/2014  . Hypertension 09/24/2013  . OSA (obstructive sleep apnea) 03/20/2013  . Protein calorie malnutrition (HCC) 02/13/2013  . Uncontrolled persistent asthma 02/05/2013    Past Surgical History:  Procedure Laterality Date  . BREAST SURGERY Right 1980   as a teenager , cyst was benign  . CESAREAN SECTION  2006  . LACERATION REPAIR Right ~ 1997   "tried to cut myself"  . TOTAL KNEE ARTHROPLASTY Left  09/01/2016   Procedure: LEFT TOTAL KNEE ARTHROPLASTY;  Surgeon: Tarry Kos, MD;  Location: [facility] OR;  Service: Orthopedics;  Laterality: Left;  . TUBAL LIGATION  2006    OB History    No data available       Home Medications    Prior to Admission medications   Medication Sig Start Date End Date Taking? Authorizing Provider  acetaminophen (TYLENOL) 500 MG tablet Take 1,000 mg by mouth every 6 (six) hours as  needed for mild pain.    [provider]  albuterol (PROAIR HFA) 108 (90 Base) MCG/ACT inhaler Inhale 2 puffs every 6 (six) hours as needed into the lungs for wheezing or shortness of breath. 09/01/17   [name], MD  azithromycin (ZITHROMAX) 500 MG tablet Take 1 tablet (500 mg total) by mouth daily. For 2days 10/23/17   Zannie Cove, MD  benzonatate (TESSALON) 100 MG capsule Take 1 capsule (100 mg total) by mouth 3 (three) times daily as needed for cough. 07/12/17   Sherryll Burger, Pratik D, DO  budesonide (PULMICORT) 0.25 MG/2ML nebulizer solution Take 2 mLs (0.25 mg total) 2 (two) times daily by nebulization. 09/01/17 09/01/18  [name], MD  fluticasone (FLONASE) 50 MCG/ACT nasal spray Place 2 sprays daily into both nostrils. 09/01/17   [name], MD  GuaiFENesin (MUCUS RELIEF PO) Take 1 tablet by mouth 2 (two) times daily.    [provider]  methocarbamol (ROBAXIN) 500 MG tablet Take 1 tablet (500 mg total) by mouth daily as needed for muscle spasms. 10/23/17   Zannie Cove, MD  montelukast (SINGULAIR) 10 MG tablet Take 1 tablet (10 mg total) at bedtime by mouth. 09/01/17   [name], MD  predniSONE (DELTASONE) 20 MG tablet Take 40mg  for 2days then 20mg  for 2days then STOP 10/24/17   Zannie Cove, MD  triamcinolone cream (KENALOG) 0.1 % Apply 1 application topically 2 (two) times daily. 05/26/17   Hoy Register, MD    Family History Family History  Problem Relation Age of Onset  . Hypertension Mother   . HIV/AIDS Father     Social History Social History   Tobacco Use  . Smoking status: Former Smoker    Packs/day: 0.25    Years: 20.00    Pack years: 5.00    Types: Cigarettes    Last attempt to quit: 01/31/2013    Years since quitting: 4.8  . Smokeless tobacco: Never Used  Substance Use Topics  . 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 Drug use: No    Comment: 01/27/2016 "clean from smoking crack since 2007"     Allergies    Tomato; Latex; and Wool alcohol [lanolin]   Review of Systems Review of Systems  Unable to perform ROS: Acuity of condition  Respiratory: Positive for cough, shortness of breath and wheezing.      Physical Exam Updated Vital Signs BP 139/69 (BP Location: Left Arm)   Pulse (!) 107   Temp 97.9 F (36.6 C) (Oral)   Resp (!) 26   SpO2 98%   Physical Exam CONSTITUTIONAL: ill appearing HEAD: Normocephalic/atraumatic EYES: EOMI ENMT: Mucous membranes moist NECK: supple no meningeal signs SPINE/BACK:entire spine nontender CV: Tachycardic LUNGS: Tachypneic, wheezing, distress noted ABDOMEN: soft, nontender GU:no cva tenderness NEURO: Pt is awake/alert/appropriate, moves all extremitiesx4.  No facial droop.   EXTREMITIES: pulses normal/equal, full ROM, no lower extremity edema SKIN: warm, color normal PSYCH: Mildly anxious  ED Treatments / Results  Labs (all labs ordered are listed, but only abnormal results are displayed) Labs Reviewed  BASIC METABOLIC PANEL - Abnormal; Notable for the following components:      Result Value   Potassium 3.3 (*)    Glucose, Bld 127 (*)    Calcium 8.1 (*)    All other components within normal limits  CBC WITH DIFFERENTIAL/PLATELET - Abnormal; Notable for the following components:   WBC 13.4 (*)    Hemoglobin 11.6 (*)    RDW 18.5 (*)    Neutro Abs 9.6 (*)    Monocytes Absolute 1.3 (*)    All other components within normal limits  INFLUENZA PANEL BY PCR (TYPE A & B)    EKG  EKG Interpretation  Date/Time:  Thursday November 30 2017 02:10:48 EST Ventricular Rate:  106 PR Interval:    QRS Duration: 108 QT Interval:  343 QTC Calculation: 456 R Axis:   79 Text Interpretation:  Sinus tachycardia Inferior infarct, age indeterminate Confirmed by Zadie Rhine ([id number]) on 11/30/2017 2:15:29 AM       Radiology Dg Chest Port 1 View  Result Date: 11/30/2017 CLINICAL DATA:  Shortness of breath EXAM: PORTABLE CHEST 1 VIEW COMPARISON:   10/20/2017 FINDINGS: The heart size and mediastinal contours are within normal limits. Both lungs are clear. The visualized skeletal structures are unremarkable. IMPRESSION: No active disease. Electronically Signed   By: Jasmine Pang M.D.   On: 11/30/2017 02:35    Procedures Procedures  CRITICAL CARE Performed by: Joya Gaskins Total critical care time: 40 minutes Critical care time was exclusive of separately billable procedures and treating other patients. Critical care was necessary to treat or prevent imminent or life-threatening deterioration. Critical care was time spent personally by me on the following activities: development of treatment plan with patient and/or surrogate as well as nursing, discussions with consultants, evaluation of patient's response to treatment, examination of patient, obtaining history from patient or surrogate, ordering and performing treatments and interventions, ordering and review of laboratory studies, ordering and review of radiographic studies, pulse oximetry and re-evaluation of patient's condition. PATIENT WITH STATUS ASTHMATICUS, REQUIRED BIPAP AND CONTINUOUS NEB TREATMENT   Medications Ordered in ED Medications  albuterol (PROVENTIL,VENTOLIN) solution continuous neb (10 mg/hr Nebulization Given 11/30/17 0232)  albuterol (PROVENTIL) (2.5 MG/3ML) 0.083% nebulizer solution 5 mg (5 mg Nebulization Given 11/30/17 0416)     Initial Impression / Assessment and Plan / ED Course  I have reviewed the triage vital signs and the nursing notes.  Pertinent labs & imaging results that were available during my care of the patient were reviewed by me and considered in my medical decision making (see chart for details).     2:22 AM Patient with history of severe asthma presents with asthma attack.  I have placed her on BiPAP, and will continue albuterol.  She would need to be admitted.  Will follow closely 2:54 AM Patient is awake and alert, no distress,  appears to be improving with BiPAP. 4:37 AM Patient overall is improving, she is tolerated being off BiPAP.  However she is still coughing and wheezing.  She still appears mildly tachypneic.  She would need to be admitted. 4:54 AM Discussed with Dr. Toniann Fail for admission. BP 135/65   Pulse (!) 101   Temp 97.9 F (36.6 C) (Oral)   Resp (!) 26   SpO2 99%   Final Clinical Impressions(s) / ED Diagnoses   Final diagnoses:  Severe persistent asthma with status asthmaticus    ED  Discharge Orders    None       Zadie Rhine, MD 11/30/17 [phone]

## 2017-11-30 NOTE — Progress Notes (Signed)
Started patient on Bipap due to increased shortness of breath. Albuterol 10mg /hr started.

## 2017-11-30 NOTE — Progress Notes (Signed)
Pt admitted to 3E rm 23, alert and oriented to unit, pt wanting to take a wash up before been connected to tele monitor.

## 2017-11-30 NOTE — Telephone Encounter (Signed)
Called and spoke with pt. Pt states that's she is needing forms signed from [employer] in order to receive money within 3 months.  Pt also states that she needs a form completed by [employer] stating that her power can not be turned off, as pt has cpap machine.  Pt is aware that VS will be back in office on 12/04/17. Pt states she will have her spouse bring forms by our office today. Pt was okay with waiting until VS is back in office to complete forms.  Will route to VS and Kelli to f/u on.

## 2017-11-30 NOTE — H&P (Signed)
History and Physical    [name] VHQ:[id number] DOB: 1966/09/27 DOA: 11/30/2017  PCP: Hoy RegisterNewlin, [name], MD  Patient coming from: Home.  Chief Complaint: Shortness of breath.  HPI: [name] is a 52 y.o. female with history of asthma, sleep apnea presents to the ER with complaints of shortness of breath and wheezing.  Patient states over the last 4 days patient has been having increasing wheezing productive cough.  Denies any chest pain fever or chills.  Has been producing discolored sputum.  Patient had some leftover prednisone which she had taken last 3 days.  Also tried some nebulizer despite which patient was still short of breath.  ED Course: She was brought in and was initially placed on BiPAP.  With nebulizer treatment and steroids with patient symptoms improved.  Chest x-ray was unremarkable.  Patient is able to talk sentences with partner still wheezing so patient be admitted for further observation.  Review of Systems: As per HPI, rest all negative.   Past Medical History:  Diagnosis Date  . Arthritis    "knees, lower back; legs, ankles" (01/27/2016)  . Asthma    followed by Dr. Craige CottaSood  . CHF (congestive heart failure) (HCC) 2016   "when I went into a coma"  . Cocaine abuse (HCC)   . 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 Dyspnea   . GERD (gastroesophageal reflux disease)   . Hypertension    "doctor took me off RX in 2016" (01/27/2016)  . Influenza B April 2014   Complicated by multi-organ failure  . OSA on CPAP "since " 03/20/2013  . Pneumonia 2016  . Required emergent intubation    asthma exacerbation in 2016  . Tobacco abuse   . Upper airway cough syndrome     Past Surgical History:  Procedure Laterality Date  . BREAST SURGERY Right 1980   as a teenager , cyst was benign  . CESAREAN SECTION  2006  . LACERATION REPAIR Right ~ 1997   "tried to cut myself"  . TOTAL KNEE ARTHROPLASTY Left 09/01/2016   Procedure: LEFT TOTAL KNEE ARTHROPLASTY;  Surgeon:  Tarry KosNaiping M Xu, MD;  Location: [facility] OR;  Service: Orthopedics;  Laterality: Left;  . TUBAL LIGATION  2006     reports that she quit smoking about 4 years ago. Her smoking use included cigarettes. She has a 5.00 pack-year smoking history. she has never used smokeless tobacco. She reports that she does not drink alcohol or use drugs.  Allergies  Allergen Reactions  . Tomato Hives, Itching and Other (See Comments)    ALSO REACTS TO KETCHUP  . Latex Itching and Rash  . Wool Alcohol [Lanolin] Itching    Family History  Problem Relation Age of Onset  . Hypertension Mother   . HIV/AIDS Father     Prior to Admission medications   Medication Sig Start Date End Date Taking? Authorizing Provider  albuterol (PROVENTIL) (2.5 MG/3ML) 0.083% nebulizer solution Take 2.5 mg by nebulization every 6 (six) hours as needed for wheezing or shortness of breath.   Yes [provider]  budesonide (PULMICORT) 0.25 MG/2ML nebulizer solution Take 2 mLs (0.25 mg total) 2 (two) times daily by nebulization. 09/01/17 09/01/18 Yes Coralyn HellingSood, Vineet, MD  albuterol (PROAIR HFA) 108 (90 Base) MCG/ACT inhaler Inhale 2 puffs every 6 (six) hours as needed into the lungs for wheezing or shortness of breath. Patient not taking: Reported on 11/30/2017 09/01/17   Coralyn HellingSood, Vineet, MD  azithromycin (ZITHROMAX) 500 MG tablet Take  1 tablet (500 mg total) by mouth daily. For 2days Patient not taking: Reported on 11/30/2017 10/23/17   Zannie Cove, MD  benzonatate (TESSALON) 100 MG capsule Take 1 capsule (100 mg total) by mouth 3 (three) times daily as needed for cough. Patient not taking: Reported on 11/30/2017 07/12/17   Maurilio Lovely D, DO  fluticasone (FLONASE) 50 MCG/ACT nasal spray Place 2 sprays daily into both nostrils. Patient not taking: Reported on 11/30/2017 09/01/17   [name], MD  methocarbamol (ROBAXIN) 500 MG tablet Take 1 tablet (500 mg total) by mouth daily as needed for muscle spasms. Patient not taking: Reported on  11/30/2017 10/23/17   Zannie Cove, MD  montelukast (SINGULAIR) 10 MG tablet Take 1 tablet (10 mg total) at bedtime by mouth. Patient not taking: Reported on 11/30/2017 09/01/17   [name], MD  predniSONE (DELTASONE) 20 MG tablet Take 40mg  for 2days then 20mg  for 2days then STOP Patient not taking: Reported on 11/30/2017 10/24/17   Zannie Cove, MD  triamcinolone cream (KENALOG) 0.1 % Apply 1 application topically 2 (two) times daily. Patient not taking: Reported on 11/30/2017 05/26/17   Hoy Register, MD    Physical Exam: Vitals:   11/30/17 0330 11/30/17 0345 11/30/17 0400 11/30/17 0432  BP:    135/65  Pulse: (!) 104 (!) 112 (!) 102 (!) 101  Resp: (!) 21 19 19  (!) 26  Temp:      TempSrc:      SpO2: 96% 95% 91% 99%      Constitutional: Moderately built and nourished. Vitals:   11/30/17 0330 11/30/17 0345 11/30/17 0400 11/30/17 0432  BP:    135/65  Pulse: (!) 104 (!) 112 (!) 102 (!) 101  Resp: (!) 21 19 19  (!) 26  Temp:      TempSrc:      SpO2: 96% 95% 91% 99%   Eyes: Anicteric no pallor. ENMT: No discharge from the ears eyes nose or mouth. Neck: No JVD appreciated no mass felt. Respiratory: Bilateral expiratory wheezes no crepitations. Cardiovascular: S1-S2 heard no murmurs appreciated. Abdomen: Soft nontender bowel sounds present. Musculoskeletal: No edema.  No joint effusion. Skin: No rash. Neurologic: Alert awake oriented to time place and person.  Moves all extremities. Psychiatric: Appears normal.  Normal affect.   Labs on Admission: I have personally reviewed following labs and imaging studies  CBC: Recent Labs  Lab 11/30/17 0220  WBC 13.4*  NEUTROABS 9.6*  HGB 11.6*  HCT 37.1  MCV 85.5  PLT 393   Basic Metabolic Panel: Recent Labs  Lab 11/30/17 0220  NA 143  K 3.3*  CL 107  CO2 25  GLUCOSE 127*  BUN 11  CREATININE 0.80  CALCIUM 8.1*   GFR: CrCl cannot be calculated (Unknown ideal weight.). Liver Function Tests: No results for input(s):  AST, ALT, ALKPHOS, BILITOT, PROT, ALBUMIN in the last 168 hours. No results for input(s): LIPASE, AMYLASE in the last 168 hours. No results for input(s): AMMONIA in the last 168 hours. Coagulation Profile: No results for input(s): INR, PROTIME in the last 168 hours. Cardiac Enzymes: No results for input(s): CKTOTAL, CKMB, CKMBINDEX, TROPONINI in the last 168 hours. BNP (last 3 results) No results for input(s): PROBNP in the last 8760 hours. HbA1C: No results for input(s): HGBA1C in the last 72 hours. CBG: No results for input(s): GLUCAP in the last 168 hours. Lipid Profile: No results for input(s): CHOL, HDL, LDLCALC, TRIG, CHOLHDL, LDLDIRECT in the last 72 hours. Thyroid Function Tests: No results  for input(s): TSH, T4TOTAL, FREET4, T3FREE, THYROIDAB in the last 72 hours. Anemia Panel: No results for input(s): VITAMINB12, FOLATE, FERRITIN, TIBC, IRON, RETICCTPCT in the last 72 hours. Urine analysis:    Component Value Date/Time   COLORURINE YELLOW 07/09/2017 1453   APPEARANCEUR TURBID (A) 07/09/2017 1453   LABSPEC >1.030 (H) 07/09/2017 1453   PHURINE 5.5 07/09/2017 1453   GLUCOSEU NEGATIVE 07/09/2017 1453   HGBUR SMALL (A) 07/09/2017 1453   BILIRUBINUR SMALL (A) 07/09/2017 1453   BILIRUBINUR small 05/26/2017 1539   KETONESUR 15 (A) 07/09/2017 1453   PROTEINUR TRACE (A) 07/09/2017 1453   UROBILINOGEN 1.0 05/26/2017 1539   UROBILINOGEN 1.0 03/22/2014 0805   NITRITE NEGATIVE 07/09/2017 1453   LEUKOCYTESUR NEGATIVE 07/09/2017 1453   Sepsis Labs: @LABRCNTIP (procalcitonin:4,lacticidven:4) )No results found for this or any previous visit (from the past 240 hour(s)).   Radiological Exams on Admission: Dg Chest Port 1 View  Result Date: 11/30/2017 CLINICAL DATA:  Shortness of breath EXAM: PORTABLE CHEST 1 VIEW COMPARISON:  10/20/2017 FINDINGS: The heart size and mediastinal contours are within normal limits. Both lungs are clear. The visualized skeletal structures are unremarkable.  IMPRESSION: No active disease. Electronically Signed   By: Jasmine Pang M.D.   On: 11/30/2017 02:35     Assessment/Plan Principal Problem:   Acute respiratory failure with hypoxia (HCC) Active Problems:   OSA (obstructive sleep apnea)   Essential hypertension   Status asthmaticus    1. Acute respiratory failure with hypoxia secondary to asthma exacerbation -influenza PCR is negative.  Will check sputum culture since patient has productive cough.  Chest x-ray does not show any infiltrates.  Since patient has discolored sputum production will place patient on Zithromax continue with albuterol nebulizer, Pulmicort and Solu-Medrol.  Will check troponin and BNP. 2. Sleep apnea on CPAP.  Urine drug screen pending.   DVT prophylaxis: Lovenox. Code Status: Full code. Family Communication: Discussed with patient. Disposition Plan: Home. Consults called: None. Admission status: Observation.   Eduard Clos MD [facility] Pager [phone].  If 7PM-7AM, please contact night-coverage [email or web address] Password [email or web address]  11/30/2017, 5:01 AM

## 2017-11-30 NOTE — Progress Notes (Signed)
[facility] update note  Reviewed the H&P written by Dr. Toniann FailKakrakandy, with current plan of care.  Ordered Toradol for muscular skeletal pain.  Patient's cough is productive and Mucinex.  Advised patient to increase intake of water.  UDS positive for cocaine Troponin negative, repeat pending Potassium replaced  Kathleen SalterPhillip M Zettie Gootee MD

## 2017-12-01 ENCOUNTER — Other Ambulatory Visit: Payer: Self-pay

## 2017-12-01 DIAGNOSIS — Y92009 Unspecified place in unspecified non-institutional (private) residence as the place of occurrence of the external cause: Secondary | ICD-10-CM | POA: Diagnosis not present

## 2017-12-01 DIAGNOSIS — Z91018 Allergy to other foods: Secondary | ICD-10-CM | POA: Diagnosis not present

## 2017-12-01 DIAGNOSIS — M170 Bilateral primary osteoarthritis of knee: Secondary | ICD-10-CM | POA: Diagnosis present

## 2017-12-01 DIAGNOSIS — K219 Gastro-esophageal reflux disease without esophagitis: Secondary | ICD-10-CM | POA: Diagnosis present

## 2017-12-01 DIAGNOSIS — F1410 Cocaine abuse, uncomplicated: Secondary | ICD-10-CM | POA: Diagnosis present

## 2017-12-01 DIAGNOSIS — Z830 Family history of human immunodeficiency virus [HIV] disease: Secondary | ICD-10-CM | POA: Diagnosis not present

## 2017-12-01 DIAGNOSIS — Z7951 Long term (current) use of inhaled steroids: Secondary | ICD-10-CM | POA: Diagnosis not present

## 2017-12-01 DIAGNOSIS — J209 Acute bronchitis, unspecified: Secondary | ICD-10-CM | POA: Diagnosis present

## 2017-12-01 DIAGNOSIS — G4733 Obstructive sleep apnea (adult) (pediatric): Secondary | ICD-10-CM | POA: Diagnosis present

## 2017-12-01 DIAGNOSIS — T380X5A Adverse effect of glucocorticoids and synthetic analogues, initial encounter: Secondary | ICD-10-CM | POA: Diagnosis present

## 2017-12-01 DIAGNOSIS — Z8249 Family history of ischemic heart disease and other diseases of the circulatory system: Secondary | ICD-10-CM | POA: Diagnosis not present

## 2017-12-01 DIAGNOSIS — M19072 Primary osteoarthritis, left ankle and foot: Secondary | ICD-10-CM | POA: Diagnosis present

## 2017-12-01 DIAGNOSIS — Z6841 Body Mass Index (BMI) 40.0 and over, adult: Secondary | ICD-10-CM | POA: Diagnosis not present

## 2017-12-01 DIAGNOSIS — M19071 Primary osteoarthritis, right ankle and foot: Secondary | ICD-10-CM | POA: Diagnosis present

## 2017-12-01 DIAGNOSIS — R0602 Shortness of breath: Secondary | ICD-10-CM | POA: Diagnosis not present

## 2017-12-01 DIAGNOSIS — J9601 Acute respiratory failure with hypoxia: Secondary | ICD-10-CM | POA: Diagnosis present

## 2017-12-01 DIAGNOSIS — F17210 Nicotine dependence, cigarettes, uncomplicated: Secondary | ICD-10-CM | POA: Diagnosis present

## 2017-12-01 DIAGNOSIS — J101 Influenza due to other identified influenza virus with other respiratory manifestations: Secondary | ICD-10-CM | POA: Diagnosis present

## 2017-12-01 DIAGNOSIS — Z91040 Latex allergy status: Secondary | ICD-10-CM | POA: Diagnosis not present

## 2017-12-01 DIAGNOSIS — Z96652 Presence of left artificial knee joint: Secondary | ICD-10-CM | POA: Diagnosis present

## 2017-12-01 DIAGNOSIS — E876 Hypokalemia: Secondary | ICD-10-CM | POA: Diagnosis present

## 2017-12-01 DIAGNOSIS — I10 Essential (primary) hypertension: Secondary | ICD-10-CM | POA: Diagnosis present

## 2017-12-01 DIAGNOSIS — Z91048 Other nonmedicinal substance allergy status: Secondary | ICD-10-CM | POA: Diagnosis not present

## 2017-12-01 DIAGNOSIS — E7439 Other disorders of intestinal carbohydrate absorption: Secondary | ICD-10-CM | POA: Diagnosis present

## 2017-12-01 DIAGNOSIS — J4552 Severe persistent asthma with status asthmaticus: Secondary | ICD-10-CM | POA: Diagnosis not present

## 2017-12-01 LAB — CBC WITH DIFFERENTIAL/PLATELET
BASOS ABS: 0 10*3/uL (ref 0.0–0.1)
Basophils Relative: 0 %
EOS PCT: 0 %
Eosinophils Absolute: 0 10*3/uL (ref 0.0–0.7)
HEMATOCRIT: 34.3 % — AB (ref 36.0–46.0)
Hemoglobin: 10.8 g/dL — ABNORMAL LOW (ref 12.0–15.0)
LYMPHS PCT: 9 %
Lymphs Abs: 1.5 10*3/uL (ref 0.7–4.0)
MCH: 26.9 pg (ref 26.0–34.0)
MCHC: 31.5 g/dL (ref 30.0–36.0)
MCV: 85.3 fL (ref 78.0–100.0)
MONO ABS: 1 10*3/uL (ref 0.1–1.0)
MONOS PCT: 6 %
Neutro Abs: 14.3 10*3/uL — ABNORMAL HIGH (ref 1.7–7.7)
Neutrophils Relative %: 85 %
PLATELETS: 389 10*3/uL (ref 150–400)
RBC: 4.02 MIL/uL (ref 3.87–5.11)
RDW: 18.6 % — AB (ref 11.5–15.5)
WBC: 16.9 10*3/uL — ABNORMAL HIGH (ref 4.0–10.5)

## 2017-12-01 LAB — BASIC METABOLIC PANEL
Anion gap: 11 (ref 5–15)
BUN: 16 mg/dL (ref 6–20)
CO2: 24 mmol/L (ref 22–32)
Calcium: 8.3 mg/dL — ABNORMAL LOW (ref 8.9–10.3)
Chloride: 106 mmol/L (ref 101–111)
Creatinine, Ser: 0.82 mg/dL (ref 0.44–1.00)
GFR calc Af Amer: >60 mL/min (ref >60)
GLUCOSE: 116 mg/dL — AB (ref 65–99)
POTASSIUM: 4.2 mmol/L (ref 3.5–5.1)
Sodium: 141 mmol/L (ref 135–145)

## 2017-12-01 MED ORDER — DOXYCYCLINE HYCLATE 100 MG PO TABS: 100.0000 mg | ORAL_TABLET | Freq: Two times a day (BID) | ORAL | Status: DC

## 2017-12-01 MED ORDER — ENOXAPARIN SODIUM 60 MG/0.6ML ~~LOC~~ SOLN
60.0000 mg | SUBCUTANEOUS | Status: DC
  Filled 2017-12-01: qty 0.6

## 2017-12-01 MED ORDER — DOXYCYCLINE HYCLATE 100 MG PO TABS
100.0000 mg | ORAL_TABLET | Freq: Two times a day (BID) | ORAL | Status: DC
  Administered 2017-12-01 – 2017-12-02 (×2): 100 mg via ORAL
  Filled 2017-12-01 (×2): qty 1

## 2017-12-01 MED ORDER — ALBUTEROL SULFATE (2.5 MG/3ML) 0.083% IN NEBU
2.5000 mg | INHALATION_SOLUTION | Freq: Four times a day (QID) | RESPIRATORY_TRACT | Status: DC
  Administered 2017-12-02: 2.5 mg via RESPIRATORY_TRACT
  Filled 2017-12-01 (×2): qty 3

## 2017-12-01 NOTE — Care Management Note (Signed)
Case Management Note  Patient Details  Name: Kathleen Rodriguez AgeLouise H Marmolejos MRN: [id number] Date of Birth: [birth year]  Subjective/Objective:    Acute Resp Failure with Hypoxia               Action/Plan: Patient lives at home; she is [facility] and she gets her medication there also. CM following for progression of care.  Expected Discharge Date:    possibly 12/05/2017              Expected Discharge Plan:  Home/Self Care  Discharge planning Services  CM Consult  Status of Service:  In process, will continue to follow  Reola MosherChandler, Shontay Wallner L, RN,MHA,BSN [phone] 12/01/2017, 4:02 PM

## 2017-12-01 NOTE — Progress Notes (Signed)
Hospitalist progress note   Kathleen Rodriguez  WUJ:[id number] DOB: [birth year] DOA: 11/30/2017 PCP: Hoy Register, MD   Specialists:   Brief Narrative:  52 year old female known moderate persistent asthma, obesity, hypertension, reflux, sinus tachycardia, hyperglycemia secondary to steroids, possible obstructive sleep apnea on CPAP, total knee replacement left side, prior cocaine 2017 Admitted with hypoxic respiratory failure 11/30/2017 as well as discolored sputum, BNP 97, potassium 3.4, white count 14, respiratory viral panel is pending, cocaine positive, CXR negative  Assessment & Plan:   Assessment:  The encounter diagnosis was Severe persistent asthma with status asthmaticus.  Asthma exacerbation-noinfluenza-like illness, keep contact precautions-continue Solu-Medrol 40 mg every 12, azithromycin 500 every 24-->doxycycline 100 bid as no source [continueing as is smoker], albuterol every 4 hours Pulmicort 0.25 twice daily and Mucinex 600 twice daily Prior tobacco cocaine use-reemphasized the need to quit BMI 44-needs outpatient counseling regarding weight loss Hyperglycemia secondary to steroids-keep on sliding scale coverage-sugars over the past 24 hours 116 to 81 Hypokalemia-now resolved from 3.4-4.2 with replacement, monitor in a.m. Leukocytosis likely secondary to steroids Obstructive sleep apnea on CPAP, resume CPAP at night    DVT prophylaxis: Lovenox code Status:   Full code   family Communication:   None Disposition Plan: Inpatient   Consultants:   None  Procedures:   None  Antimicrobials:   Azithromycin  Subjective: Awake alert a little sob but no overt distress no n/v Eating walked around unit to some degree but felt winded  Objective: Vitals:   11/30/17 2334 12/01/17 0013 12/01/17 0338 12/01/17 0555  BP:  130/70  (!) 134/56  Pulse: 92 90 90 95  Resp: 18 18 16 18   Temp:  97.8 F (36.6 C)  98 F (36.7 C)  TempSrc:  Oral  Oral  SpO2: 98% 96% 98% 96%   Weight:    121.6 kg (268 lb)  Height:        Intake/Output Summary (Last 24 hours) at 12/01/2017 0737 Last data filed at 12/01/2017 0559 Gross per 24 hour  Intake 490 ml  Output 550 ml  Net -60 ml   Filed Weights   11/30/17 1512 12/01/17 0555  Weight: 121.6 kg (268 lb) 121.6 kg (268 lb)    Examination: eomi obese pleasant no distress Wheeze posteriorly without rales No abd tenderness no rebound no guard No le edema Neuro intact  Data Reviewed: I have personally reviewed following labs and imaging studies  CBC: Recent Labs  Lab 11/30/17 0220 11/30/17 0904 12/01/17 0437  WBC 13.4* 14.4* 16.9*  NEUTROABS 9.6*  --  14.3*  HGB 11.6* 10.1* 10.8*  HCT 37.1 33.1* 34.3*  MCV 85.5 86.4 85.3  PLT 393 367 389   Basic Metabolic Panel: Recent Labs  Lab 11/30/17 0220 11/30/17 0904 12/01/17 0437  NA 143 138 141  K 3.3* 3.4* 4.2  CL 107 107 106  CO2 25 20* 24  GLUCOSE 127* 281* 116*  BUN 11 11 16   CREATININE 0.80 0.83 0.82  CALCIUM 8.1* 7.9* 8.3*   GFR: Estimated Creatinine Clearance: 104.9 mL/min (by C-G formula based on SCr of 0.82 mg/dL). Liver Function Tests: No results for input(s): AST, ALT, ALKPHOS, BILITOT, PROT, ALBUMIN in the last 168 hours. No results for input(s): LIPASE, AMYLASE in the last 168 hours. No results for input(s): AMMONIA in the last 168 hours. Coagulation Profile: No results for input(s): INR, PROTIME in the last 168 hours. Cardiac Enzymes: Recent Labs  Lab 11/30/17 0904 11/30/17 1540  TROPONINI <0.03 <0.03   CBG:  No results for input(s): GLUCAP in the last 168 hours. Urine analysis:    Component Value Date/Time   COLORURINE YELLOW 07/09/2017 1453   APPEARANCEUR TURBID (A) 07/09/2017 1453   LABSPEC >1.030 (H) 07/09/2017 1453   PHURINE 5.5 07/09/2017 1453   GLUCOSEU NEGATIVE 07/09/2017 1453   HGBUR SMALL (A) 07/09/2017 1453   BILIRUBINUR SMALL (A) 07/09/2017 1453   BILIRUBINUR small 05/26/2017 1539   KETONESUR 15 (A) 07/09/2017  1453   PROTEINUR TRACE (A) 07/09/2017 1453   UROBILINOGEN 1.0 05/26/2017 1539   UROBILINOGEN 1.0 03/22/2014 0805   NITRITE NEGATIVE 07/09/2017 1453   LEUKOCYTESUR NEGATIVE 07/09/2017 1453     Radiology Studies: Reviewed images personally in health database    Scheduled Meds: . albuterol  2.5 mg Nebulization Q4H  . budesonide  0.25 mg Nebulization BID  . enoxaparin (LOVENOX) injection  40 mg Subcutaneous Q24H  . guaiFENesin  600 mg Oral BID  . methylPREDNISolone (SOLU-MEDROL) injection  40 mg Intravenous Q12H   Continuous Infusions: . azithromycin       LOS: 0 days    Time spent: 825    Pleas KochJai Inetha Maret, MD [facility] ([facility]) [phone]   If 7PM-7AM, please contact night-coverage [email or web address] Password [email or web address] 12/01/2017, 7:37 AM

## 2017-12-02 DIAGNOSIS — J45901 Unspecified asthma with (acute) exacerbation: Secondary | ICD-10-CM

## 2017-12-02 DIAGNOSIS — J9601 Acute respiratory failure with hypoxia: Secondary | ICD-10-CM

## 2017-12-02 DIAGNOSIS — G4733 Obstructive sleep apnea (adult) (pediatric): Secondary | ICD-10-CM

## 2017-12-02 LAB — CULTURE, RESPIRATORY W GRAM STAIN

## 2017-12-02 LAB — CULTURE, RESPIRATORY

## 2017-12-02 MED ORDER — AMOXICILLIN 500 MG PO CAPS
500.0000 mg | ORAL_CAPSULE | Freq: Three times a day (TID) | ORAL | Status: DC
  Administered 2017-12-02: 500 mg via ORAL
  Filled 2017-12-02: qty 1

## 2017-12-02 MED ORDER — DOXYCYCLINE HYCLATE 100 MG PO TABS: 100.0000 mg | ORAL_TABLET | Freq: Two times a day (BID) | ORAL | 0 refills | Status: DC

## 2017-12-02 MED ORDER — ALBUTEROL SULFATE HFA 108 (90 BASE) MCG/ACT IN AERS: 2.0000 | INHALATION_SPRAY | Freq: Four times a day (QID) | RESPIRATORY_TRACT | 0 refills | Status: DC | PRN

## 2017-12-02 MED ORDER — GUAIFENESIN ER 600 MG PO TB12: 600.0000 mg | ORAL_TABLET | Freq: Two times a day (BID) | ORAL | 0 refills | Status: DC

## 2017-12-02 MED ORDER — MONTELUKAST SODIUM 10 MG PO TABS: 10.0000 mg | ORAL_TABLET | Freq: Every day | ORAL | 0 refills | Status: DC

## 2017-12-02 MED ORDER — PREDNISONE 10 MG PO TABS: ORAL_TABLET | ORAL | 0 refills | Status: DC

## 2017-12-02 MED ORDER — AMOXICILLIN 500 MG PO CAPS: 500.0000 mg | ORAL_CAPSULE | Freq: Three times a day (TID) | ORAL | 0 refills | Status: DC

## 2017-12-02 NOTE — Telephone Encounter (Signed)
I completed form for [employer].  I did not see any forms from [employer] in my look at section.

## 2017-12-02 NOTE — Progress Notes (Signed)
Patient given discharge instructions and all questions answered.  

## 2017-12-02 NOTE — Discharge Summary (Signed)
Physician Discharge Summary  Kathleen Rodriguez ZOX:[id number] DOB: February 04, 1966  PCP: Hoy RegisterNewlin, [name], MD  Admit date: 11/30/2017 Discharge date: 12/02/2017  Recommendations for Outpatient Follow-up:  1. Dr. Hoy RegisterEnobong Newlin, PCP in 4 days with repeat labs (CBC).  Please follow final sputum culture results that were sent from the hospital. 2. Dr. Coralyn HellingVineet Sood, Pulmonology in 2 weeks.  Home Health: None Equipment/Devices: None  Discharge Condition: Improved and stable CODE STATUS: Full Diet recommendation: Heart healthy diet.  Discharge Diagnoses:  Principal Problem:   Acute respiratory failure with hypoxia (HCC) Active Problems:   OSA (obstructive sleep apnea)   Essential hypertension   Status asthmaticus   Brief Summary: 52 year old married female, lives with her spouse and teenage kids (1113 and 16 years), PMH of asthma, OSA on nightly CPAP, tobacco & cocaine abuse, morbid obesity, presented to ED with 4 days history of productive cough, wheezing without fever, chills or chest pain.  She reports that her children recently had URTI symptoms before her.  She tried some of the leftover prednisone that she had at home along with nebulizer treatment without significant relief.  She briefly required BiPAP in the ED but improved after nebulizer treatments and steroids.  She was admitted for asthma exacerbation.  Assessment and plan:  1. Acute asthma exacerbation: Likely precipitated by acute viral bronchitis with superadded H influenza.  Flu panel PCR negative.  She did have some cough productive of yellow sputum.  Preliminary sputum culture shows Haemophilus influenza, sensitivities pending.  Chest x-ray did not show acute disease.  Received couple doses of azithromycin which was then changed to oral doxycycline.  However based on sputum culture results, discussed with ID MD on call and changed to PO amoxicillin to complete total 5 days treatment.  Treated with IV Solu-Medrol, bronchodilator  nebulizations.  Clinically improved.  Ambulated in hall without significant symptoms and not hypoxic on room air.  Tobacco cessation counseled.  She will be discharged on oral prednisone taper, complete course of oral amoxicillin, continue home albuterol inhaler/nebulizer, resume Singulair and continue Pulmicort nebulizations.  Outpatient follow-up with PCP in a couple days and with pulmonology in 2 weeks.  Clinically improved. 2. Acute respiratory failure with hypoxia: Precipitated by asthma exacerbation.  Briefly required BiPAP in ED.  Resolved. 3. OSA: Continue nightly CPAP. 4. Tobacco abuse: Cessation counseled. 5. Cocaine abuse: Cessation consult. 6. Morbid obesity/Body mass index is 40.27 kg/m.  Needs to lose weight. 7. Leukocytosis: Likely due to steroids.  Follow CBCs as outpatient. 8. Essential hypertension: Controlled off of medications as outpatient. 9. GERD: Asymptomatic and not on medications PTA. 10. Hypokalemia: Resolved. 11. Normocytic anemia: Unclear etiology.  Stable.  Outpatient follow-up. 12. Glucose intolerance: Last A1c 5.7 on 07/11/17.  Most recent random blood sugar on BMP not markedly elevated.  Outpatient follow-up.   Consultations:  None  Procedures:  BiPAP   Discharge Instructions  Discharge Instructions    Call MD for:  difficulty breathing, headache or visual disturbances   Complete by:  As directed    Call MD for:  extreme fatigue   Complete by:  As directed    Call MD for:  persistant dizziness or light-headedness   Complete by:  As directed    Call MD for:  temperature >100.4   Complete by:  As directed    Diet - low sodium heart healthy   Complete by:  As directed    Increase activity slowly   Complete by:  As directed  Medication List    STOP taking these medications   azithromycin 500 MG tablet Commonly known as:  ZITHROMAX   benzonatate 100 MG capsule Commonly known as:  TESSALON   fluticasone 50 MCG/ACT nasal  spray Commonly known as:  FLONASE   methocarbamol 500 MG tablet Commonly known as:  ROBAXIN   triamcinolone cream 0.1 % Commonly known as:  KENALOG     TAKE these medications   albuterol (2.5 MG/3ML) 0.083% nebulizer solution Commonly known as:  PROVENTIL Take 2.5 mg by nebulization every 6 (six) hours as needed for wheezing or shortness of breath.   albuterol 108 (90 Base) MCG/ACT inhaler Commonly known as:  PROAIR HFA Inhale 2 puffs into the lungs every 6 (six) hours as needed for wheezing or shortness of breath.   amoxicillin 500 MG capsule Commonly known as:  AMOXIL Take 1 capsule (500 mg total) by mouth 3 (three) times daily.   budesonide 0.25 MG/2ML nebulizer solution Commonly known as:  PULMICORT Take 2 mLs (0.25 mg total) 2 (two) times daily by nebulization.   guaiFENesin 600 MG 12 hr tablet Commonly known as:  MUCINEX Take 1 tablet (600 mg total) by mouth 2 (two) times daily.   montelukast 10 MG tablet Commonly known as:  SINGULAIR Take 1 tablet (10 mg total) by mouth at bedtime.   predniSONE 10 MG tablet Commonly known as:  DELTASONE Take 4 tabs daily for 3 days, then 3 tabs daily for 3 days, then 2 tabs daily for 3 days, then 1 tab daily for 3 days, then stop. What changed:    medication strength  additional instructions      Follow-up Information    Hoy Register, MD. Schedule an appointment as soon as possible for a visit in 4 day(s).   Specialty:  Family Medicine Why:  To be seen with repeat labs (CBC). Contact information: [address] [place] [address] [phone]        [name], MD. Schedule an appointment as soon as possible for a visit in 2 week(s).   Specialty:  Pulmonary Disease Contact information: [address] [place] [address] [phone]          Allergies  Allergen Reactions  . Tomato Hives, Itching and Other (See Comments)    ALSO REACTS TO KETCHUP  . Latex Itching and Rash  . Wool  Alcohol [Lanolin] Itching      Procedures/Studies: Dg Chest Port 1 View  Result Date: 11/30/2017 CLINICAL DATA:  Shortness of breath EXAM: PORTABLE CHEST 1 VIEW COMPARISON:  10/20/2017 FINDINGS: The heart size and mediastinal contours are within normal limits. Both lungs are clear. The visualized skeletal structures are unremarkable. IMPRESSION: No active disease. Electronically Signed   By: Jasmine Pang M.D.   On: 11/30/2017 02:35      Subjective: Feels much better.  Dyspnea, wheezing, cough have all significantly improved.  Currently has some wheezing at times, mild DOE and intermittent mild cough.  Ambulated with RN supervision on room air and saturated in the 90s without difficulty.  Wishes to go home today.  Reports that she had quit smoking and then again went back to smoking a few days prior to admission and even smoked cocaine a few days prior to admission.  Discharge Exam:  Vitals:   12/01/17 1920 12/01/17 2047 12/02/17 0605 12/02/17 0738  BP: 137/77  132/85   Pulse: (!) 107  95   Resp: 18  18   Temp: 98.5 F (36.9 C)  98 F (36.7 C)   TempSrc: Oral  Oral   SpO2: 96% 96% 95% 96%  Weight:   109.8 kg (242 lb)   Height:        General: Pleasant young female, moderately built and morbidly obese, lying comfortably in bed. Cardiovascular: S1 & S2 heard, RRR, S1/S2 +. No murmurs, rubs, gallops or clicks. No JVD or pedal edema. Respiratory: Clear to auscultation anteriorly.  Occasional expiratory rhonchi posteriorly.  No crackles.  No increased work of breathing.  Able to speak in full sentences. Abdominal:  Non distended, non tender & soft. No organomegaly or masses appreciated. Normal bowel sounds heard. CNS: Alert and oriented. No focal deficits. Extremities: no edema, no cyanosis    The results of significant diagnostics from this hospitalization (including imaging, microbiology, ancillary and laboratory) are listed below for reference.     Microbiology: Recent  Results (from the past 240 hour(s))  Culture, expectorated sputum-assessment     Status: None   Collection Time: 11/30/17  9:04 AM  Result Value Ref Range Status   Specimen Description SPUTUM  Final   Special Requests NONE  Final   Sputum evaluation   Final    THIS SPECIMEN IS ACCEPTABLE FOR SPUTUM CULTURE Performed at [facility], [address], [place], [place] [address]    Report Status 11/30/2017 FINAL  Final  Culture, respiratory (NON-Expectorated)     Status: None (Preliminary result)   Collection Time: 11/30/17  9:04 AM  Result Value Ref Range Status   Specimen Description SPUTUM  Final   Special Requests NONE Reflexed from [id number]  Final   Gram Stain   Final    MODERATE WBC PRESENT, PREDOMINANTLY PMN MODERATE GRAM NEGATIVE RODS RARE GRAM POSITIVE COCCI Performed at [facility], [address], [place], [place] [address]    Culture ABUNDANT HAEMOPHILUS INFLUENZAE  Final   Report Status PENDING  Incomplete     Labs: CBC: Recent Labs  Lab 11/30/17 0220 11/30/17 0904 12/01/17 0437  WBC 13.4* 14.4* 16.9*  NEUTROABS 9.6*  --  14.3*  HGB 11.6* 10.1* 10.8*  HCT 37.1 33.1* 34.3*  MCV 85.5 86.4 85.3  PLT 393 367 389   Basic Metabolic Panel: Recent Labs  Lab 11/30/17 0220 11/30/17 0904 12/01/17 0437  NA 143 138 141  K 3.3* 3.4* 4.2  CL 107 107 106  CO2 25 20* 24  GLUCOSE 127* 281* 116*  BUN 11 11 16   CREATININE 0.80 0.83 0.82  CALCIUM 8.1* 7.9* 8.3*   BNP (last 3 results) Recent Labs    07/09/17 1043 10/20/17 1352 11/30/17 0904  BNP 27.2 14.3 97.0   Cardiac Enzymes: Recent Labs  Lab 11/30/17 0904 11/30/17 1540  TROPONINI <0.03 <0.03       Time coordinating discharge: Over 30 minutes  SIGNED:  Marcellus Scott, MD, FACP, [facility]. [facility] Pager [phone]  If 7PM-7AM, please contact night-coverage [email or web address] Password [email or web address] 12/02/2017, 12:00 PM

## 2017-12-02 NOTE — Progress Notes (Addendum)
Patient is for dc today, she states [facility] and she does not have funds for her abx,  She sates her Medicaid is for [employer] [facility].  NCM will asst with Match Letter if she does not have Medicaid.  Per Financial counselor note she does have Medicaid as of 11/24/17, but there is no Medicaid number  To verify,  they are looking into.  NCM will ast patient with Match Letter for dc today.

## 2017-12-02 NOTE — Discharge Instructions (Signed)
Please get your medications reviewed and adjusted by your Primary MD. ° °Please request your Primary MD to go over all Hospital Tests and Procedure/Radiological results at the follow up, please get all Hospital records sent to your Prim MD by signing hospital release before you go home. ° °If you had Pneumonia of Lung problems at the Hospital: °Please get a 2 view Chest X ray done in 6-8 weeks after hospital discharge or sooner if instructed by your Primary MD. ° °If you have Congestive Heart Failure: °Please call your Cardiologist or Primary MD anytime you have any of the following symptoms:  °1) 3 pound weight gain in 24 hours or 5 pounds in 1 week  °2) shortness of breath, with or without a dry hacking cough  °3) swelling in the hands, feet or stomach  °4) if you have to sleep on extra pillows at night in order to breathe ° °Follow cardiac low salt diet and 1.5 lit/day fluid restriction. ° °If you have diabetes °Accuchecks 4 times/day, Once in AM empty stomach and then before each meal. °Log in all results and show them to your primary doctor at your next visit. °If any glucose reading is under 80 or above 300 call your primary MD immediately. ° °If you have Seizure/Convulsions/Epilepsy: °Please do not drive, operate heavy machinery, participate in activities at heights or participate in high speed sports until you have seen by Primary MD or a Neurologist and advised to do so again. ° °If you had Gastrointestinal Bleeding: °Please ask your Primary MD to check a complete blood count within one week of discharge or at your next visit. Your endoscopic/colonoscopic biopsies that are pending at the time of discharge, will also need to followed by your Primary MD. ° °Get Medicines reviewed and adjusted. °Please take all your medications with you for your next visit with your Primary MD ° °Please request your Primary MD to go over all hospital tests and procedure/radiological results at the follow up, please ask your  Primary MD to get all Hospital records sent to his/her office. ° °If you experience worsening of your admission symptoms, develop shortness of breath, life threatening emergency, suicidal or homicidal thoughts you must seek medical attention immediately by calling 911 or calling your MD immediately  if symptoms less severe. ° °You must read complete instructions/literature along with all the possible adverse reactions/side effects for all the Medicines you take and that have been prescribed to you. Take any new Medicines after you have completely understood and accpet all the possible adverse reactions/side effects.  ° °Do not drive or operate heavy machinery when taking Pain medications.  ° °Do not take more than prescribed Pain, Sleep and Anxiety Medications ° °Special Instructions: If you have smoked or chewed Tobacco  in the last 2 yrs please stop smoking, stop any regular Alcohol  and or any Recreational drug use. ° °Wear Seat belts while driving. ° °Please note °You were cared for by a hospitalist during your hospital stay. If you have any questions about your discharge medications or the care you received while you were in the hospital after you are discharged, you can call the unit and asked to speak with the hospitalist on call if the hospitalist that took care of you is not available. Once you are discharged, your primary care physician will handle any further medical issues. Please note that NO REFILLS for any discharge medications will be authorized once you are discharged, as it is imperative that you   return to your primary care physician (or establish a relationship with a primary care physician if you do not have one) for your aftercare needs so that they can reassess your need for medications and monitor your lab values. ° °You can reach the hospitalist office at phone [phone] or fax [phone] °  °If you do not have a primary care physician, you can call [phone] for a physician  referral. ° ° °Asthma, Acute Bronchospasm °Acute bronchospasm caused by asthma is also referred to as an asthma attack. Bronchospasm means your air passages become narrowed. The narrowing is caused by inflammation and tightening of the muscles in the air tubes (bronchi) in your lungs. This can make it hard to breathe or cause you to wheeze and cough. °What are the causes? °Possible triggers are: °· Animal dander from the skin, hair, or feathers of animals. °· Dust mites contained in house dust. °· Cockroaches. °· Pollen from trees or grass. °· Mold. °· Cigarette or tobacco smoke. °· Air pollutants such as dust, household cleaners, hair sprays, aerosol sprays, paint fumes, strong chemicals, or strong odors. °· Cold air or weather changes. Cold air may trigger inflammation. Winds increase molds and pollens in the air. °· Strong emotions such as crying or laughing hard. °· Stress. °· Certain medicines such as aspirin or beta-blockers. °· Sulfites in foods and drinks, such as dried fruits and wine. °· Infections or inflammatory conditions, such as a flu, cold, or inflammation of the nasal membranes (rhinitis). °· Gastroesophageal reflux disease (GERD). GERD is a condition where stomach acid backs up into your esophagus. °· Exercise or strenuous activity. ° °What are the signs or symptoms? °· Wheezing. °· Excessive coughing, particularly at night. °· Chest tightness. °· Shortness of breath. °How is this diagnosed? °Your health care provider will ask you about your medical history and perform a physical exam. A chest X-ray or blood testing may be performed to look for other causes of your symptoms or other conditions that may have triggered your asthma attack. °How is this treated? °Treatment is aimed at reducing inflammation and opening up the airways in your lungs. Most asthma attacks are treated with inhaled medicines. These include quick relief or rescue medicines (such as bronchodilators) and controller medicines  (such as inhaled corticosteroids). These medicines are sometimes given through an inhaler or a nebulizer. Systemic steroid medicine taken by mouth or given through an IV tube also can be used to reduce the inflammation when an attack is moderate or severe. Antibiotic medicines are only used if a bacterial infection is present. °Follow these instructions at home: °· Rest. °· Drink plenty of liquids. This helps the mucus to remain thin and be easily coughed up. Only use caffeine in moderation and do not use alcohol until you have recovered from your illness. °· Do not smoke. Avoid being exposed to secondhand smoke. °· You play a critical role in keeping yourself in good health. Avoid exposure to things that cause you to wheeze or to have breathing problems. °· Keep your medicines up-to-date and available. Carefully follow your health care provider’s treatment plan. °· Take your medicine exactly as prescribed. °· When pollen or pollution is bad, keep windows closed and use an air conditioner or go to places with air conditioning. °· Asthma requires careful medical care. See your health care provider for a follow-up as advised. If you are more than [redacted] weeks pregnant and you were prescribed any new medicines, let your obstetrician know about the visit and   how you are doing. Follow up with your health care provider as directed. °· After you have recovered from your asthma attack, make an appointment with your outpatient doctor to talk about ways to reduce the likelihood of future attacks. If you do not have a doctor who manages your asthma, make an appointment with a primary care doctor to discuss your asthma. °Get help right away if: °· You are getting worse. °· You have trouble breathing. If severe, call your local emergency services (911 in the [place] °· You develop chest pain or discomfort. °· You are vomiting. °· You are not able to keep fluids down. °· You are coughing up yellow, green, brown, or bloody  sputum. °· You have a fever and your symptoms suddenly get worse. °· You have trouble swallowing. °This information is not intended to replace advice given to you by your health care provider. Make sure you discuss any questions you have with your health care provider. °Document Released: 01/25/2007 Document Revised: 03/23/2016 Document Reviewed: 04/17/2013 °Elsevier Interactive Patient Education © 2017 [employer] Inc. ° °

## 2017-12-02 NOTE — Progress Notes (Signed)
SATURATION QUALIFICATIONS: (This note is used to comply with regulatory documentation for home oxygen)  Patient Saturations on Room Air at Rest = 96%  Patient Saturations on Room Air while Ambulating = 94%  Patient Saturations on Liters of oxygen while Ambulating = %  Please briefly explain why patient needs home oxygen: 

## 2017-12-04 NOTE — Telephone Encounter (Signed)
Pt is returning call. Cb is [phone].

## 2017-12-04 NOTE — Telephone Encounter (Signed)
Spoke with pt, aware that [employer] form has been filled out.  Pt states that she needs a letter typed up by VS stating her medical ailments, mentioning her frequent ED visits d/t asthma, and why this is limiting her from being able to work.  VS are you willing to type this letter?

## 2017-12-04 NOTE — Telephone Encounter (Signed)
Called and spoke with pt who stated that she is wanting to know if VS has signed forms that was brought to our office 11/30/17 that needs to be signed in order for pt to received money within 3 months.  Pt's husband, Alveta Heimlich came to the office 11/24/17 and I had the below conversation with him regarding his wife:  Brought pt's husband, Alveta Heimlich,  back from the lobby to look at the forms he had brought [place].   Per Alveta Heimlich pt received a check of $2,313 x1week ago which is already gone. Pt then received a check today of $750 that he stated is already gone.  Pt is to be receiving a check of $12,406 at any time that he believes will be gone as soon as pt receives the money.  Alveta Heimlich stated to me that pt has been missing [id number] and a police report was made today, 11/24/17.  Alveta Heimlich has met with social security regarding the pt's money issue and per [employer], a letter needs to be written stating pt is uncompetent to manage her money and husband needs to take over managing pt's money due to all that has happened with previous checks.  Discussed with Maryann Conners and Clayborne Dana who both stated that none of our doctors would write a letter for Alveta Heimlich due to this not being a pulmonary issue and Alveta Heimlich needs to go back to the police station letting them know the information that has been happening with the money and if pt is found, she could be taken to be evaluated.  Called Alveta Heimlich and stated the above information letting him know what was said per TD and KW.  Alveta Heimlich expressed understanding and said he would tell this to social security and then would go from there about going back to the police station.

## 2017-12-04 NOTE — Telephone Encounter (Signed)
Called and spoke with patient Letter was completed, and signed [employer] forms completed and signed Placed in envelope up from Nothing further needed.

## 2017-12-05 NOTE — Telephone Encounter (Signed)
Pt. Never showed up to sign papers. I called her, she's in the ED about her back. She said she would try and come 12/06/17. When pt does come in I will create another ph. Note. Nothing further needed.

## 2017-12-08 ENCOUNTER — Telehealth: Payer: Self-pay | Admitting: Pulmonary Disease

## 2017-12-08 NOTE — Telephone Encounter (Signed)
Called and spoke to pt. Pt states she no longer has family planning medicaid and now has regular medicaid.  Pt would like to proceed with xoliar injections, that were mentioned at 09/01/17 OV.  Pt has HFU with TP scheduled on 12/18/17. Will route to [employer] Scott to start process. Will route to VS as an FYI.    Editor: Coralyn HellingSood, Vineet, MD (Physician)    Stop dulera and spiriva  Budesonide one vial nebulized twice per day, and rinse mouth after each use  Albuterol one vial nebulized every 4 hours as needed for cough, wheeze, or chest congestion  Will give you another course of prednisone  Singulair 10 mg pill nightly  Flonase one spray in each nostril daily  Will start the process to get approval for xolair  Follow up in 4 to 6 weeks with Dr. Craige CottaSood or Tammy Parrett

## 2017-12-08 NOTE — Telephone Encounter (Signed)
Called pt. She is coming in 12/11/17 to sign PAN form. (Around 2:30) I will make Morrie Sheldonshley aware because I'm off Mon..Marland Kitchen

## 2017-12-11 NOTE — Telephone Encounter (Signed)
Kathleen Rodriguez has the forms and is aware, nothing further needed.

## 2017-12-14 NOTE — Telephone Encounter (Signed)
Pt. Did not come in to fill out the papers. After 3 attempts its up to the pt..Marland Kitchen

## 2017-12-15 ENCOUNTER — Ambulatory Visit: Payer: Medicaid Other | Admitting: Family Medicine

## 2017-12-18 ENCOUNTER — Inpatient Hospital Stay: Payer: Medicaid Other | Admitting: Adult Health

## 2017-12-29 ENCOUNTER — Ambulatory Visit: Payer: Medicaid Other | Admitting: Family Medicine

## 2018-01-31 ENCOUNTER — Telehealth: Payer: Self-pay | Admitting: Pulmonary Disease

## 2018-01-31 MED ORDER — ALBUTEROL SULFATE (2.5 MG/3ML) 0.083% IN NEBU: 2.5000 mg | INHALATION_SOLUTION | Freq: Four times a day (QID) | RESPIRATORY_TRACT | 0 refills | Status: DC | PRN

## 2018-01-31 MED ORDER — PREDNISONE 10 MG (21) PO TBPK: ORAL_TABLET | Freq: Every day | ORAL | 0 refills | Status: DC

## 2018-01-31 NOTE — Telephone Encounter (Signed)
Patient called and would like a letter written by VS stating she needs to move because of her allergies and asthma. Patient stated the house she lives in is old and doesn't have central air. Patient stated she wants to move to a complex but would need a letter to take to the social security office.   Patient also stated she needs prednisone and albuterol nebulizer solution. Patient reports increased wheezing but no other symptoms. Last OV was in November 2018 and was supposed to return in 6 weeks. Patient has upcoming appointment but doesn't know if she will be able to make it or not.   VS please advise.

## 2018-01-31 NOTE — Telephone Encounter (Signed)
Can send prednisone 10 mg pill >> 3 pills daily for 2 days, 2 pills daily for 2 days, 1 pill daily for 2 days.  #12 with no refills.  Can send script for albuterol solution.

## 2018-01-31 NOTE — Telephone Encounter (Signed)
Called and spoke to patient. Advised patient of Rxs that Dr. Craige CottaSood is going to send for her and let her know he has not addressed the letter she is asking for yet. Nothing else needed at this time.

## 2018-01-31 NOTE — Telephone Encounter (Signed)
Attempted to call patient, no answer, patient does not have voicemail set up to leave a message.

## 2018-02-06 ENCOUNTER — Encounter: Payer: Self-pay | Admitting: Pulmonary Disease

## 2018-02-06 NOTE — Telephone Encounter (Signed)
Letter has been placed in outgoing mail to be mailed to address on file- verified with pt. Pt is aware and voiced her understanding. Nothing further is needed.

## 2018-02-06 NOTE — Telephone Encounter (Signed)
Letter printed.

## 2018-02-14 ENCOUNTER — Telehealth: Payer: Self-pay | Admitting: Pulmonary Disease

## 2018-02-14 MED ORDER — FLUCONAZOLE 150 MG PO TABS: 150.0000 mg | ORAL_TABLET | Freq: Every day | ORAL | 0 refills | Status: DC

## 2018-02-14 NOTE — Telephone Encounter (Signed)
ATC patient. Call went to VM, but it has not been setup. Will attempt to call patient before I send in medication.

## 2018-02-14 NOTE — Telephone Encounter (Signed)
Called pt and advised message from the provider. Pt understood and verbalized understanding. Rx sent in and nothing further is needed.  

## 2018-02-14 NOTE — Telephone Encounter (Signed)
Send fluconazole 150 mg x one pill.

## 2018-02-14 NOTE — Telephone Encounter (Signed)
Pt states that the prednisone taper has caused her to have a vaginal yeast infection- states this frequently happens when she is put on prednisone.  Pt is requesting something to be called in for this.    [facility] [address]  VS please advise on recs.  Thanks.

## 2018-02-19 ENCOUNTER — Encounter: Payer: Self-pay | Admitting: Pulmonary Disease

## 2018-02-19 ENCOUNTER — Ambulatory Visit (INDEPENDENT_AMBULATORY_CARE_PROVIDER_SITE_OTHER): Payer: Medicaid Other | Admitting: Pulmonary Disease

## 2018-02-19 VITALS — BP 140/98 | HR 94 | Ht 66.0 in | Wt 263.0 lb

## 2018-02-19 DIAGNOSIS — J45998 Other asthma: Secondary | ICD-10-CM | POA: Diagnosis not present

## 2018-02-19 DIAGNOSIS — G4733 Obstructive sleep apnea (adult) (pediatric): Secondary | ICD-10-CM

## 2018-02-19 DIAGNOSIS — J4550 Severe persistent asthma, uncomplicated: Secondary | ICD-10-CM | POA: Diagnosis not present

## 2018-02-19 DIAGNOSIS — R05 Cough: Secondary | ICD-10-CM

## 2018-02-19 DIAGNOSIS — R058 Other specified cough: Secondary | ICD-10-CM

## 2018-02-19 MED ORDER — MONTELUKAST SODIUM 10 MG PO TABS: 10.0000 mg | ORAL_TABLET | Freq: Every day | ORAL | 5 refills | Status: DC

## 2018-02-19 MED ORDER — MONTELUKAST SODIUM 10 MG PO TABS: 10.0000 mg | ORAL_TABLET | Freq: Every day | ORAL | 0 refills | Status: DC

## 2018-02-19 MED ORDER — ALBUTEROL SULFATE HFA 108 (90 BASE) MCG/ACT IN AERS: 2.0000 | INHALATION_SPRAY | Freq: Four times a day (QID) | RESPIRATORY_TRACT | 0 refills | Status: DC | PRN

## 2018-02-19 MED ORDER — BUDESONIDE 0.25 MG/2ML IN SUSP: 0.2500 mg | Freq: Two times a day (BID) | RESPIRATORY_TRACT | 3 refills | Status: DC

## 2018-02-19 MED ORDER — ALBUTEROL SULFATE (2.5 MG/3ML) 0.083% IN NEBU: 2.5000 mg | INHALATION_SOLUTION | Freq: Four times a day (QID) | RESPIRATORY_TRACT | 0 refills | Status: DC | PRN

## 2018-02-19 MED ORDER — FLUTICASONE PROPIONATE 50 MCG/ACT NA SUSP: 1.0000 | Freq: Every day | NASAL | 5 refills | Status: DC

## 2018-02-19 MED ORDER — ALBUTEROL SULFATE HFA 108 (90 BASE) MCG/ACT IN AERS: 2.0000 | INHALATION_SPRAY | Freq: Four times a day (QID) | RESPIRATORY_TRACT | 5 refills | Status: DC | PRN

## 2018-02-19 NOTE — Patient Instructions (Signed)
Will arrange for high resolution CT chest  Will check on status of xolair application  Follow up in 2 months

## 2018-02-19 NOTE — Progress Notes (Signed)
[facility]  Chief Complaint  Patient presents with  . Acute Visit    coughing, increased shortness of breath, recently on Prednisone     Vital signs: BP (!) 140/98 (BP Location: Left Arm, Cuff Size: Normal)   Pulse 94   Ht 5\' 6"  (1.676 m)   Wt 263 lb (119.3 kg)   SpO2 95%   BMI 42.45 kg/m   History of Present Illness: Kathleen Rodriguez is a 52 y.o. female former smoker with cough from asthma and post nasal drip.  She also has a history of obstructive sleep apnea.  She still has trouble with her breathing.  She is getting tight in her chest, and has cough with clear sputum.  She has sinus congestion and pressure in her sinuses.  Gets intermittent wheezing.  Denies fever, hemoptysis, skin rash, or swelling.  She hasn't been using singulair.  Uses nebulizer bid.  Feels better on prednisone, but then gets blurry vision and yeast infections.  Physical Exam:  General - pleasant Eyes - pupils reactive ENT - boggy nasal mucosa with clear drainage, mild sinus tenderness, no oral exudate, no LAN Cardiac - regular, no murmur Chest - decreased BS, no wheeze Abd - soft, non tender Ext - no edema Skin - no rashes Neuro - normal strength Psych - normal mood   Assessment/Plan:  Severe persistent allergic asthma with recurrent exacerbations. - will have her resume singulair again - if symptoms not improved, then consider adding low dose prednisone on chronic basis - continue pulmicort, albuterol via nebulizer - check on status of xolair - will arrange for high resolution CT chest to assess for additional causes of her recurrent exacerbations  Upper airway cough syndrome with post nasal drip. - flonase, singulair - advised her to try using OTC antihistamine  Obstructive sleep apnea. - she is compliant with CPAP and reports benefit from therapy   Patient Instructions  Will arrange for high resolution CT chest  Will check on status of xolair  application  Follow up in 2 months    Chesley Mires, MD [place] 02/19/2018, 9:43 AM  Flow Sheet  Pulmonary tests: CT chest4/15/14 >>no PE, no acute findings Labs 02/11/13 >> ANA negative, Anti-GBM Ab < 1, ANCA negative PFT 05/15/13>>FEV1 1.90 (76%), FEV1% 72, TLC 4.23 (81%), DLCO 89%, no BD PFT 03/03/17 >> FEV1 1.57 (64%), FEV1% 64, TLC 4.47 (84%), DLCO 685, + BD RAST 07/28/17 >> mild reaction to trees, ragweed; IgE 446  Sleep tests: PSG 04/19/13 >> AHI 16.4, SpO2 low 84%.  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  Family History: Her family history includes HIV/AIDS in her father; Hypertension in her mother.  Social History: She  reports that she quit smoking about 5 years ago. Her smoking use included cigarettes. She has a 5.00 pack-year smoking history. She has never used smokeless tobacco. She reports that she does not drink alcohol or use drugs.  Medications:  Allergies as of 02/19/2018      Reactions   Tomato Hives, Itching, Other (See Comments)   ALSO REACTS TO KETCHUP   Latex Itching, Rash   Wool Alcohol [lanolin] Itching      Medication List        Accurate as of 02/19/18  9:43 AM. Always use your most recent med list.          albuterol (2.5 MG/3ML) 0.083% nebulizer solution Commonly known as:  PROVENTIL Take  3 mLs (2.5 mg total) by nebulization every 6 (six) hours as needed for wheezing or shortness of breath.   albuterol 108 (90 Base) MCG/ACT inhaler Commonly known as:  PROAIR HFA Inhale 2 puffs into the lungs every 6 (six) hours as needed for wheezing or shortness of breath.   budesonide 0.25 MG/2ML nebulizer solution Commonly known as:  PULMICORT Take 2 mLs (0.25 mg total) by nebulization 2 (two) times daily.   fluticasone 50 MCG/ACT nasal spray Commonly known as:  FLONASE Place 1 spray into both nostrils daily.   guaiFENesin 600 MG 12 hr tablet Commonly known as:  MUCINEX Take 1 tablet (600 mg total) by mouth 2 (two) times daily.   montelukast 10 MG tablet Commonly known as:  SINGULAIR Take 1 tablet (10 mg total) by mouth at bedtime.

## 2018-02-22 ENCOUNTER — Inpatient Hospital Stay: Admission: RE | Admit: 2018-02-22 | Payer: Medicaid Other | Source: Ambulatory Visit

## 2018-02-26 ENCOUNTER — Encounter: Payer: Self-pay | Admitting: Pulmonary Disease

## 2018-02-26 NOTE — Telephone Encounter (Signed)
Error

## 2018-03-06 ENCOUNTER — Inpatient Hospital Stay: Admission: RE | Admit: 2018-03-06 | Payer: Medicaid Other | Source: Ambulatory Visit

## 2018-03-09 ENCOUNTER — Telehealth: Payer: Self-pay | Admitting: Pulmonary Disease

## 2018-03-12 ENCOUNTER — Ambulatory Visit (INDEPENDENT_AMBULATORY_CARE_PROVIDER_SITE_OTHER)
Admission: RE | Admit: 2018-03-12 | Discharge: 2018-03-12 | Disposition: A | Discharge status: Home / Self Care | Payer: Medicaid Other | Source: Ambulatory Visit | Attending: Pulmonary Disease | Admitting: Pulmonary Disease

## 2018-03-12 DIAGNOSIS — J4550 Severe persistent asthma, uncomplicated: Secondary | ICD-10-CM | POA: Diagnosis not present

## 2018-03-13 ENCOUNTER — Telehealth: Payer: Self-pay | Admitting: Pulmonary Disease

## 2018-03-13 NOTE — Telephone Encounter (Signed)
Patient calling back to check on status of Xolair.

## 2018-03-13 NOTE — Telephone Encounter (Signed)
Prefilled Syringes: #  4  #75mg  2 Arrival Date: 03/13/18 Lot #:  [id number]      [id number] Exp Date:  09/2018    09/2018

## 2018-03-13 NOTE — Telephone Encounter (Signed)
Called pt to let her know her xolair came in. Pt is going to call back when she has someone to bring her and sch her appt.Marland Kitchen

## 2018-03-13 NOTE — Telephone Encounter (Signed)
Patient calling and asking for CT results.  VS please advise what you would like [place] to share with patient.

## 2018-03-14 NOTE — Telephone Encounter (Signed)
HRCT chest 03/12/18 >> mild cylindrical BTX and peribronchial thickening   Attempted to call pt.  Voice mail not activated.  Please let her know her CT chest shows changes from allergic asthma.  She should continue on her current medications and avoid any irritants that could make her symptoms worse, particularly any smoking related irritants.

## 2018-03-15 NOTE — Telephone Encounter (Signed)
Pt is returning call about CT scan results. Cb is [phone].

## 2018-03-15 NOTE — Telephone Encounter (Signed)
Called spoke with patient to discuss CT results/recommendations Patient requested clarification the "changes from allergic asthma"  Attempted to assist patient with this clarification but she became increasingly agitated on the phone, stating that she still does not understand.  Dr Craige Cotta, can you help to break this statement down for patient so that she can understand better?  Thank you!

## 2018-03-16 NOTE — Telephone Encounter (Signed)
Try calling patient.  Voicemail still not set up.  Please call pt and ask her for a phone number that she can be reached at.

## 2018-03-20 ENCOUNTER — Telehealth: Payer: Self-pay | Admitting: Pulmonary Disease

## 2018-03-20 MED ORDER — EPINEPHRINE 0.3 MG/0.3ML IJ SOAJ: 0.3000 mg | Freq: Once | INTRAMUSCULAR | 11 refills | Status: DC

## 2018-03-20 NOTE — Telephone Encounter (Signed)
Patient's only contact # is [phone].  Routing back to VS for follow-up

## 2018-03-20 NOTE — Telephone Encounter (Signed)
Rx sent 

## 2018-03-23 NOTE — Telephone Encounter (Signed)
Called  [phone], woman answered the phone and stated that Kendra Grissett was not home at that time,but she would have her call us when she was available.

## 2018-03-26 ENCOUNTER — Ambulatory Visit: Payer: Medicaid Other

## 2018-03-27 NOTE — Telephone Encounter (Signed)
Spoke with pt, she states this number [phone] is the best number. She states she is going through a lot right now with her brother but still wanted to speak to Dr. Craige CottaSood about the results since she did not understand. Dr. Craige CottaSood please call back when available. Thanks.

## 2018-03-29 ENCOUNTER — Telehealth (INDEPENDENT_AMBULATORY_CARE_PROVIDER_SITE_OTHER): Payer: Self-pay | Admitting: Orthopaedic Surgery

## 2018-03-29 ENCOUNTER — Encounter (INDEPENDENT_AMBULATORY_CARE_PROVIDER_SITE_OTHER): Payer: Self-pay

## 2018-03-29 NOTE — Telephone Encounter (Signed)
See message below. If you approve letter, what would you like the letter to say specifically.?

## 2018-03-29 NOTE — Telephone Encounter (Signed)
WILL GO OUT IN THE MAIL TOMORROW.

## 2018-03-29 NOTE — Telephone Encounter (Signed)
State that she has had a knee replacement which will set off metal detector.

## 2018-03-29 NOTE — Telephone Encounter (Signed)
Patient called needing a letter stating she has metal in her left knee. Patient is going to visit someone in prison. Patient said she is leaving the fist of July. Patient asked if the letter can be mailed to her. The number to contact patient is [phone]

## 2018-03-30 NOTE — Telephone Encounter (Signed)
Left voice mail on machine for patient to return phone call back regarding in need of scheduling an appt to further discuss her concerns with VS this week. X1

## 2018-03-30 NOTE — Telephone Encounter (Signed)
Please arrange for f/u appointment with me to further discuss.

## 2018-04-02 NOTE — Telephone Encounter (Signed)
Attempted to call pt. I did not receive an answer. I have left a message for pt to return our call.   Pt is scheduled for an OV with Dr. Craige CottaSood on 04/13/18.

## 2018-04-02 NOTE — Telephone Encounter (Signed)
Called and spoke with pt regarding schedule of appt with VS. Appt scheduled for 04-13-2018 at 10:15am is okay with pt to wait. Nothing further needed at this time.

## 2018-04-06 ENCOUNTER — Telehealth: Payer: Self-pay | Admitting: Pulmonary Disease

## 2018-04-06 NOTE — Telephone Encounter (Signed)
Called and spoke with patient regarding asthma flares. Pt states she had asthma flare few days ago and took old RX prednisone from 68mo ago. Pt denies SOB, wheezing, chest pain or tightness. Pt states that her asthma is at baseline Pt states due to the prednisone has a vaginal yeast infection; requesting antibiotics Advised pt that b/c asthma is at baseline she will need to contact PCP for vaginal yeast infection. Pt aware to keep appt with VS on 04-13-2018 Nothing further needed at this time.

## 2018-04-13 ENCOUNTER — Encounter: Payer: Self-pay | Admitting: Pulmonary Disease

## 2018-04-13 ENCOUNTER — Ambulatory Visit (INDEPENDENT_AMBULATORY_CARE_PROVIDER_SITE_OTHER): Payer: Medicaid Other

## 2018-04-13 ENCOUNTER — Ambulatory Visit (INDEPENDENT_AMBULATORY_CARE_PROVIDER_SITE_OTHER): Payer: Medicaid Other | Admitting: Pulmonary Disease

## 2018-04-13 VITALS — BP 128/80 | HR 79 | Ht 66.0 in | Wt 264.0 lb

## 2018-04-13 DIAGNOSIS — G4733 Obstructive sleep apnea (adult) (pediatric): Secondary | ICD-10-CM

## 2018-04-13 DIAGNOSIS — Z9989 Dependence on other enabling machines and devices: Secondary | ICD-10-CM | POA: Diagnosis not present

## 2018-04-13 DIAGNOSIS — J45998 Other asthma: Secondary | ICD-10-CM

## 2018-04-13 DIAGNOSIS — J45901 Unspecified asthma with (acute) exacerbation: Secondary | ICD-10-CM

## 2018-04-13 DIAGNOSIS — J4550 Severe persistent asthma, uncomplicated: Secondary | ICD-10-CM

## 2018-04-13 DIAGNOSIS — Z6841 Body Mass Index (BMI) 40.0 and over, adult: Secondary | ICD-10-CM

## 2018-04-13 DIAGNOSIS — R058 Other specified cough: Secondary | ICD-10-CM

## 2018-04-13 DIAGNOSIS — R05 Cough: Secondary | ICD-10-CM | POA: Diagnosis not present

## 2018-04-13 MED ORDER — MONTELUKAST SODIUM 10 MG PO TABS: 10.0000 mg | ORAL_TABLET | Freq: Every day | ORAL | 5 refills | Status: DC

## 2018-04-13 MED ORDER — ALBUTEROL SULFATE HFA 108 (90 BASE) MCG/ACT IN AERS: 2.0000 | INHALATION_SPRAY | Freq: Four times a day (QID) | RESPIRATORY_TRACT | 6 refills | Status: DC | PRN

## 2018-04-13 MED ORDER — LEVALBUTEROL HCL 0.63 MG/3ML IN NEBU
0.6300 mg | INHALATION_SOLUTION | Freq: Four times a day (QID) | RESPIRATORY_TRACT | Status: DC
  Administered 2018-04-13: 0.63 mg via RESPIRATORY_TRACT

## 2018-04-13 NOTE — Patient Instructions (Signed)
Follow-up in 4 weeks

## 2018-04-13 NOTE — Progress Notes (Signed)
[facility]  Chief Complaint  Patient presents with  . Shortness of Breath    Vital signs: BP 128/80 (BP Location: Left Arm, Cuff Size: Normal)   Pulse 79   Ht 5\' 6"  (1.676 m)   Wt 264 lb (119.7 kg)   SpO2 96%   BMI 42.61 kg/m   History of Present Illness: Kathleen Rodriguez is a 52 y.o. female former smoker with cough from asthma and post nasal drip.  She also has a history of obstructive sleep apnea.  She was here for first xolair shot this AM.  Developed urticaria at injection site and then wheezing with chest tightness.  Urticaria resolved on its own.  Breathing improved after getting nebulizer tx.  No tongue/lip swelling.  Doesn't feel like her throat is closing.  She doesn't like taste of her current albuterol inhaler.  Physical Exam:  General - pleasant Eyes - pupils reactive ENT - no sinus tenderness, no oral exudate, no LAN Cardiac - regular, no murmur Chest - b/l wheeze improved after neb tx Abd - soft, non tender Ext - no edema Skin - no rashes Neuro - normal strength Psych - normal mood  Assessment/Plan:  Severe persistent allergic asthma with recurrent exacerbations. Mild bronchiectasis on CT chest. - has hx of cocaine use - likely allergic reaction to xolair - will continue pulmicort, albuterol, singulair, mucinex - will need to determine at next follow up if she is candidate for alternative biologic agent  Upper airway cough syndrome with post nasal drip. - flonase, singulair, OTC antihistamine  Obstructive sleep apnea. - she reports compliance from CPAP and benefit   Patient Instructions  Follow up in 4 weeks    Chesley Mires, MD [place] 04/13/2018, 10:34 AM  Flow Sheet  Pulmonary tests: CT chest4/15/14 >>no PE, no acute findings Labs 02/11/13 >> ANA negative, Anti-GBM Ab < 1, ANCA negative PFT 05/15/13>>FEV1 1.90 (76%), FEV1% 72, TLC 4.23 (81%), DLCO 89%, no BD PFT 03/03/17 >>  FEV1 1.57 (64%), FEV1% 64, TLC 4.47 (84%), DLCO 685, + BD RAST 07/28/17 >> mild reaction to trees, ragweed; IgE 446 HRCT chest 03/12/18 >> mild BTX  Sleep tests: PSG 04/19/13 >> AHI 16.4, SpO2 low 84%.  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  Family History: Her family history includes HIV/AIDS in her father; Hypertension in her mother.  Social History: She  reports that she quit smoking about 5 years ago. Her smoking use included cigarettes. She has a 5.00 pack-year smoking history. She has never used smokeless tobacco. She reports that she does not drink alcohol or use drugs.  Medications: Allergies as of 04/13/2018      Reactions   Tomato Hives, Itching, Other (See Comments)   ALSO REACTS TO KETCHUP   Latex Itching, Rash   Wool Alcohol [lanolin] Itching      Medication List        Accurate as of 04/13/18 10:34 AM.  Always use your most recent med list.          albuterol (2.5 MG/3ML) 0.083% nebulizer solution Commonly known as:  PROVENTIL Take 3 mLs (2.5 mg total) by nebulization every 6 (six) hours as needed for wheezing or shortness of breath.   albuterol 108 (90 Base) MCG/ACT inhaler Commonly known as:  PROVENTIL HFA;VENTOLIN HFA Inhale 2 puffs into the lungs every 6 (six) hours as needed for wheezing or shortness of breath.   budesonide  0.25 MG/2ML nebulizer solution Commonly known as:  PULMICORT Take 2 mLs (0.25 mg total) by nebulization 2 (two) times daily.   fluticasone 50 MCG/ACT nasal spray Commonly known as:  FLONASE Place 1 spray into both nostrils daily.   guaiFENesin 600 MG 12 hr tablet Commonly known as:  MUCINEX Take 1 tablet (600 mg total) by mouth 2 (two) times daily.   montelukast 10 MG tablet Commonly known as:  SINGULAIR Take 1 tablet (10 mg total) by mouth at bedtime.

## 2018-04-16 DIAGNOSIS — J45998 Other asthma: Secondary | ICD-10-CM

## 2018-04-16 MED ORDER — OMALIZUMAB 150 MG ~~LOC~~ SOLR
375.0000 mg | Freq: Once | SUBCUTANEOUS | Status: AC
  Administered 2018-04-16: 375 mg via SUBCUTANEOUS

## 2018-04-18 ENCOUNTER — Telehealth: Payer: Self-pay | Admitting: Pulmonary Disease

## 2018-04-18 MED ORDER — PREDNISONE 10 MG PO TABS: ORAL_TABLET | ORAL | 0 refills | Status: DC

## 2018-04-18 MED ORDER — FLUCONAZOLE 100 MG PO TABS: 100.0000 mg | ORAL_TABLET | Freq: Every day | ORAL | 0 refills | Status: AC

## 2018-04-18 NOTE — Telephone Encounter (Signed)
Called and spoke to pt.  Pt is aware of below message and voiced her understanding.  Rx for prednisone and Diflucan has been sent to preferred pharmacy. Nothing further is needed.

## 2018-04-18 NOTE — Telephone Encounter (Addendum)
ATC pt- unable to leave vm due to mailbox being full.  Rx for prednisone and Diflucan are pending.

## 2018-04-18 NOTE — Telephone Encounter (Signed)
Can send script for prednisone 10 mg pill >> 3 pills daily for 2 days, 2 pills daily for 2 days, 1 pill daily for 2 days.  #12 with no refills.  Send script for diflucan 100 mg daily for 5 days with no refills.

## 2018-04-18 NOTE — Telephone Encounter (Signed)
Spoke with pt, she states she is wheezing and SOB. She thinks she is having an asthma flare. She has used her albuterol and did a breathing treatment but she knows when she needs prednisone. She has been more SOB for the last 3 days. She states the summer months is the worst time for her breathing. She is requesting a prescription for prednisone and a Rx for diflucan because the prednisone causes her to have a yeast infection. VS please advise  [facility] [address]  Current Outpatient Medications on File Prior to Visit  Medication Sig Dispense Refill  . albuterol (PROVENTIL HFA;VENTOLIN HFA) 108 (90 Base) MCG/ACT inhaler Inhale 2 puffs into the lungs every 6 (six) hours as needed for wheezing or shortness of breath. 1 Inhaler 6  . albuterol (PROVENTIL) (2.5 MG/3ML) 0.083% nebulizer solution Take 3 mLs (2.5 mg total) by nebulization every 6 (six) hours as needed for wheezing or shortness of breath. 75 mL 0  . budesonide (PULMICORT) 0.25 MG/2ML nebulizer solution Take 2 mLs (0.25 mg total) by nebulization 2 (two) times daily. 360 mL 3  . fluticasone (FLONASE) 50 MCG/ACT nasal spray Place 1 spray into both nostrils daily. 16 g 5  . guaiFENesin (MUCINEX) 600 MG 12 hr tablet Take 1 tablet (600 mg total) by mouth 2 (two) times daily. 14 tablet 0  . montelukast (SINGULAIR) 10 MG tablet Take 1 tablet (10 mg total) by mouth at bedtime. 30 tablet 5   Current Facility-Administered Medications on File Prior to Visit  Medication Dose Route Frequency Provider Last Rate Last Dose  . levalbuterol (XOPENEX) nebulizer solution 0.63 mg  0.63 mg Nebulization Q6H Coralyn HellingSood, Vineet, MD   0.63 mg at 04/13/18 1031   Allergies  Allergen Reactions  . Tomato Hives, Itching and Other (See Comments)    ALSO REACTS TO KETCHUP  . Latex Itching and Rash  . Wool Alcohol [Lanolin] Itching

## 2018-05-23 ENCOUNTER — Ambulatory Visit: Payer: Medicaid Other | Admitting: Pulmonary Disease

## 2018-06-06 ENCOUNTER — Telehealth: Payer: Self-pay | Admitting: Pulmonary Disease

## 2018-06-06 MED ORDER — FLUCONAZOLE 100 MG PO TABS: 100.0000 mg | ORAL_TABLET | Freq: Every day | ORAL | 0 refills | Status: DC

## 2018-06-06 MED ORDER — PREDNISONE 5 MG (21) PO TBPK: ORAL_TABLET | Freq: Every day | ORAL | 1 refills | Status: DC

## 2018-06-06 MED ORDER — MONTELUKAST SODIUM 10 MG PO TABS: 10.0000 mg | ORAL_TABLET | Freq: Every day | ORAL | 5 refills | Status: DC

## 2018-06-06 NOTE — Telephone Encounter (Signed)
Spoke with pt. States that she is not feeling well. Reports increased shortness of breath, chest tightness, wheezing and coughing. Cough is non productive. Denies fever/chills/sweats. Symptoms started 3 days ago. Pt refused an appointment due to transportation issues. She would like to have a prednisone taper sent in.  Dr. Craige CottaSood - please advise. Thanks.

## 2018-06-06 NOTE — Telephone Encounter (Signed)
Can send script for diflucan 100 mg daily, #7 with no refills.

## 2018-06-06 NOTE — Telephone Encounter (Signed)
Called and spoke with pt and stated to her VS said we could send a Rx of diflucan to her pharmacy. Pt expressed understanding.  Pt also was needing a refill of montelukast sent to her pharmacy as well. Sent a refill of med to her preferred pharmacy. Nothing further needed.

## 2018-06-06 NOTE — Telephone Encounter (Signed)
Called and spoke to patient, let her know what is being prescribed. Patient stated that prednisone gives her a yeast infection and would like medication ordered for that as well.  Follow up appointment made for 06/20/18 at 11:15.   Dr Craige CottaSood please advise.

## 2018-06-06 NOTE — Telephone Encounter (Signed)
Can send script for prednisone 5 mg pills.  4 pills for 2 days, 3 pills for 2 days, 2 pills for 2 days, then stay on 1 pill daily until next appointment.  #60 with 1 refill.  Schedule ROV with me or NP in two weeks.

## 2018-06-07 DIAGNOSIS — Z1211 Encounter for screening for malignant neoplasm of colon: Secondary | ICD-10-CM | POA: Diagnosis not present

## 2018-06-07 DIAGNOSIS — J45909 Unspecified asthma, uncomplicated: Secondary | ICD-10-CM | POA: Diagnosis not present

## 2018-06-07 DIAGNOSIS — E669 Obesity, unspecified: Secondary | ICD-10-CM | POA: Diagnosis not present

## 2018-06-07 DIAGNOSIS — D649 Anemia, unspecified: Secondary | ICD-10-CM | POA: Diagnosis not present

## 2018-06-07 DIAGNOSIS — Z833 Family history of diabetes mellitus: Secondary | ICD-10-CM | POA: Diagnosis not present

## 2018-06-14 ENCOUNTER — Ambulatory Visit (INDEPENDENT_AMBULATORY_CARE_PROVIDER_SITE_OTHER): Payer: Self-pay | Admitting: Orthopaedic Surgery

## 2018-06-20 ENCOUNTER — Ambulatory Visit: Payer: Medicaid Other | Admitting: Pulmonary Disease

## 2018-06-21 ENCOUNTER — Ambulatory Visit (INDEPENDENT_AMBULATORY_CARE_PROVIDER_SITE_OTHER): Payer: Self-pay | Admitting: Orthopaedic Surgery

## 2018-06-22 ENCOUNTER — Ambulatory Visit: Payer: Medicaid Other | Admitting: Pulmonary Disease

## 2018-07-03 ENCOUNTER — Ambulatory Visit (INDEPENDENT_AMBULATORY_CARE_PROVIDER_SITE_OTHER): Payer: Medicaid Other

## 2018-07-03 ENCOUNTER — Ambulatory Visit (INDEPENDENT_AMBULATORY_CARE_PROVIDER_SITE_OTHER): Payer: Medicaid Other | Admitting: Orthopaedic Surgery

## 2018-07-03 ENCOUNTER — Telehealth (INDEPENDENT_AMBULATORY_CARE_PROVIDER_SITE_OTHER): Payer: Self-pay | Admitting: Orthopaedic Surgery

## 2018-07-03 ENCOUNTER — Encounter (INDEPENDENT_AMBULATORY_CARE_PROVIDER_SITE_OTHER): Payer: Self-pay | Admitting: Orthopaedic Surgery

## 2018-07-03 ENCOUNTER — Telehealth (INDEPENDENT_AMBULATORY_CARE_PROVIDER_SITE_OTHER): Payer: Self-pay

## 2018-07-03 VITALS — Ht 66.0 in | Wt 253.0 lb

## 2018-07-03 DIAGNOSIS — M1711 Unilateral primary osteoarthritis, right knee: Secondary | ICD-10-CM

## 2018-07-03 DIAGNOSIS — M25561 Pain in right knee: Secondary | ICD-10-CM

## 2018-07-03 DIAGNOSIS — G8929 Other chronic pain: Secondary | ICD-10-CM

## 2018-07-03 NOTE — Telephone Encounter (Signed)
Kathleen Rodriguez  [phone]  [facility]    Jacki Cones called from [facility] stated that they will not be able to provide a home health aide for patient. [facility] can't provide these services to will need a new referral.

## 2018-07-03 NOTE — Telephone Encounter (Signed)
Jara at [facility] is needing this patients demographic sheet faxed over, she said it was not received with order. Also, she states they do not consider a home health nurse as a skilled need so they would not be able to provide.  Their fax # [phone] Her phone # [phone] ext: [phone]

## 2018-07-03 NOTE — Progress Notes (Addendum)
I  Office Visit Note   Patient: Kathleen Rodriguez           Date of Birth: July 30, 1966           MRN: [id number] Visit Date: 07/03/2018              Requested by: Hoy Register, MD [address] [place], [place] [address] PCP: Hoy Register, MD   Assessment & Plan: Visit Diagnoses:  1. Primary osteoarthritis of right knee   2. Chronic pain of right knee     Plan: [name] is doing well from her left total knee replacement.  X-rays demonstrate a stable prosthesis without any complications.  Her right knee unfortunately has end-stage degenerative joint disease with varus deformity.  She is in off a lot of pain and dysfunction and disability as a result of this.  She wishes to proceed with a right total knee replacement.  Risks and benefits were reviewed again.  I do think she would benefit from a hospital bed and a home health nurse postoperatively.  Questions encouraged and answered.   Follow-Up Instructions: Return for 2 week postop visit.   Orders:  Orders Placed This Encounter  Procedures  . XR Knee Complete 4 Views Right   No orders of the defined types were placed in this encounter.     Procedures: No procedures performed   Clinical Data: No additional findings.   Subjective: Chief Complaint  Patient presents with  . Right Knee - Pain    [name] is coming in today for both knees.  He is status post left total knee replacement doing very well.  She is happy with her overall recovery.  Her right knee is causing her significant pain and dysfunction which is chronic.  This is reminiscent of her left knee pain prior to his knee replacement surgery.  Denies any injuries.  She has lost 20 pounds recently.  She is interested in undergoing a right total knee replacement.   Review of Systems  Constitutional: Negative.   HENT: Negative.   Eyes: Negative.   Respiratory: Negative.   Cardiovascular: Negative.   Endocrine: Negative.   Musculoskeletal: Negative.     Neurological: Negative.   Hematological: Negative.   Psychiatric/Behavioral: Negative.   All other systems reviewed and are negative.    Objective: Vital Signs: Ht 5\' 6"  (1.676 m)   Wt 253 lb (114.8 kg)   BMI 40.84 kg/m   Physical Exam  Constitutional: She is oriented to person, place, and time. She appears well-developed and well-nourished.  HENT:  Head: Normocephalic and atraumatic.  Eyes: EOM are normal.  Neck: Neck supple.  Pulmonary/Chest: Effort normal.  Abdominal: Soft.  Neurological: She is alert and oriented to person, place, and time.  Skin: Skin is warm. Capillary refill takes less than 2 seconds.  Psychiatric: She has a normal mood and affect. Her behavior is normal. Judgment and thought content normal.  Nursing note and vitals reviewed.   Ortho Exam Left knee exam shows a fully healed surgical scar.  Collaterals intact.  Normal range of motion. Right knee exam shows no joint effusion.  Collaterals and cruciates are stable.  Normal range of motion. Specialty Comments:  No specialty comments available.  Imaging: Xr Knee Complete 4 Views Right  Result Date: 07/03/2018 End-stage degenerative joint disease with varus deformity.  Stable left total knee replacement    PMFS History: Patient Active Problem List   Diagnosis Date Noted  . Asthma 10/22/2017  . Leukocytosis 07/11/2017  .  Acute bronchitis   . Hypokalemia 07/10/2017  . Eczema 05/26/2017  . Osteoarthritis of left knee 09/01/2016  . 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 Knee pain, 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  . Status asthmaticus 06/24/2016  . Acute respiratory failure (HCC) 06/24/2016  . Acute asthma exacerbation 05/12/2016  . Acute respiratory failure with hypoxia (HCC) 05/12/2016  . Tobacco abuse   . Primary osteoarthritis of left knee 03/09/2016  . Asthma with status asthmaticus 01/27/2016  . Acute respiratory distress 01/27/2016  . Non compliance with medical treatment 08/19/2015  .  Anemia, iron deficiency 08/12/2015  . Essential hypertension 07/25/2015  . Sinus tachycardia (HCC) 07/25/2015  . Acute respiratory failure with hypercapnia (HCC) 07/24/2015  . COPD (chronic obstructive pulmonary disease) (HCC) 12/28/2014  . Tracheobronchitis 12/28/2014  . Dysfunctional uterine bleeding 12/28/2014  . Anemia 12/28/2014  . Pleuritic chest pain 05/16/2014  . Morbid obesity (HCC) 05/16/2014  . Chronic cough 05/01/2014  . Upper airway cough syndrome 05/01/2014  . Vaginal candidiasis 04/09/2014  . GERD (gastroesophageal reflux disease) 04/08/2014  . Medically noncompliant 04/07/2014  . Asthma exacerbation 04/06/2014  . CAP (community acquired pneumonia) 04/06/2014  . Chest pain 04/01/2014  . Hypertension 09/24/2013  . OSA (obstructive sleep apnea) 03/20/2013  . Protein calorie malnutrition (HCC) 02/13/2013  . Uncontrolled persistent asthma 02/05/2013   Past Medical History:  Diagnosis Date  . Arthritis    "knees, lower back; legs, ankles" (01/27/2016)  . Asthma    followed by Dr. Craige Cotta  . CHF (congestive heart failure) (HCC) 2016   "when I went into a coma"  . Cocaine abuse (HCC)   . 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 Dyspnea   . GERD (gastroesophageal reflux disease)   . Hypertension    "doctor took me off RX in 2016" (01/27/2016)  . Influenza B April 2014   Complicated by multi-organ failure  . OSA on CPAP "since " 03/20/2013  . Pneumonia 2016  . Required emergent intubation    asthma exacerbation in 2016  . Tobacco abuse   . Upper airway cough syndrome     Family History  Problem Relation Age of Onset  . Hypertension Mother   . HIV/AIDS Father     Past Surgical History:  Procedure Laterality Date  . BREAST SURGERY Right 1980   as a teenager , cyst was benign  . CESAREAN SECTION  2006  . LACERATION REPAIR Right ~ 1997   "tried to cut myself"  . 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 TUBAL LIGATION  2006   Social History   Occupational History  . Not on file  Tobacco Use  . Smoking status: Former Smoker    Packs/day: 0.25    Years: 20.00    Pack years: 5.00    Types: Cigarettes    Last attempt to quit: 01/31/2013    Years since quitting: 5.4  . Smokeless tobacco: Never Used  Substance and Sexual Activity  . 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 Drug use: No    Types: "Crack" cocaine    Comment: 01/27/2016 "clean from smoking crack since 2007"  . Sexual activity: Yes    Birth control/protection: None, Surgical

## 2018-07-03 NOTE — Telephone Encounter (Signed)
Faxed script to [facility] [phone] Order was for hospital bed and home health aide.

## 2018-07-04 NOTE — Telephone Encounter (Signed)
See message.

## 2018-07-04 NOTE — Telephone Encounter (Signed)
ok 

## 2018-07-06 NOTE — Telephone Encounter (Signed)
Do you want me to send this elsewhere?

## 2018-07-06 NOTE — Telephone Encounter (Signed)
Where can we send it?

## 2018-07-10 ENCOUNTER — Encounter: Payer: Self-pay | Admitting: Pulmonary Disease

## 2018-07-10 ENCOUNTER — Ambulatory Visit (INDEPENDENT_AMBULATORY_CARE_PROVIDER_SITE_OTHER): Payer: Medicaid Other | Admitting: Pulmonary Disease

## 2018-07-10 VITALS — BP 130/88 | HR 98 | Ht 65.0 in | Wt 251.0 lb

## 2018-07-10 DIAGNOSIS — J82 Pulmonary eosinophilia, not elsewhere classified: Secondary | ICD-10-CM | POA: Diagnosis not present

## 2018-07-10 DIAGNOSIS — J4550 Severe persistent asthma, uncomplicated: Secondary | ICD-10-CM

## 2018-07-10 DIAGNOSIS — J45998 Other asthma: Secondary | ICD-10-CM | POA: Diagnosis not present

## 2018-07-10 DIAGNOSIS — Z9989 Dependence on other enabling machines and devices: Secondary | ICD-10-CM

## 2018-07-10 DIAGNOSIS — G4733 Obstructive sleep apnea (adult) (pediatric): Secondary | ICD-10-CM

## 2018-07-10 DIAGNOSIS — J4551 Severe persistent asthma with (acute) exacerbation: Secondary | ICD-10-CM

## 2018-07-10 DIAGNOSIS — Z6841 Body Mass Index (BMI) 40.0 and over, adult: Secondary | ICD-10-CM | POA: Diagnosis not present

## 2018-07-10 DIAGNOSIS — J8283 Eosinophilic asthma: Secondary | ICD-10-CM

## 2018-07-10 MED ORDER — PREDNISONE 5 MG PO TABS: ORAL_TABLET | ORAL | 2 refills | Status: DC

## 2018-07-10 NOTE — Progress Notes (Signed)
[facility]  Chief Complaint  Patient presents with  . Follow-up    Pt is having increase SOB, wheezing, and chest tightness. Pt is having more asthma flares that normal in last 2 weeks. Pt is wheezing, and SOB when brought back to the room from the lobby. Pt is having hard time using cpap machine.    Constitutional: BP 130/88 (BP Location: Left Arm, Cuff Size: Normal)   Pulse 98   Ht 5\' 5"  (1.651 m)   Wt 251 lb (113.9 kg)   SpO2 99%   BMI 41.77 kg/m   History of Present Illness: Kathleen Rodriguez is a 52 y.o. female former smoker with cough from asthma and post nasal drip.  She also has a history of obstructive sleep apnea.  She has been off prednisone for past 2.5 weeks.  Breathing has gotten worse.  She is having more cough and chest congestion.  Wheezing more.  Feels tight in her chest.  She is not able to walk more than 200 feet w/o having to stop and rest.  Needing to use albuterol more.  She has tried symbicort, dulera, spiriva, brovana, and flovent.  These were all ineffective in controlling her asthma.  She was tried on [employer], but had allergic reaction to this after first dose.  She is not able to maintain herself off of systemic steroids.  CBC with differential from 10/20/17 showed eosinophil count of 500.  More recently lab tests have been done when she was on prednisone, and would mask eosinophil level.  She is scheduled to have right knee surgery with Dr. Erlinda Hong at the end of September.   Comprehensive Respiratory Exam:  Appearance - well kempt  ENMT - nasal mucosa moist, turbinates clear, midline nasal septum, no dental lesions, no gingival bleeding, no oral exudates, no tonsillar hypertrophy Neck - no masses, trachea midline, no thyromegaly, no elevation in JVP Respiratory - diffuse b/l wheezing, normal respiratory excursion, no dullness to percussion CV - s1s2 regular rate and rhythm, no murmurs, no peripheral edema, radial pulses  symmetric GI - soft, non tender, no masses Lymph - no adenopathy noted in neck and axillary areas MSK - normal muscle strength and tone, normal gait Ext - no cyanosis, clubbing, or joint inflammation noted Skin - no rashes, lesions, or ulcers Neuro - oriented to person, place, and time Psych - normal mood and affect  Discussion: She has severe persistent asthma.  She is not able to be controlled with nebulized steroids and leukotriene inhibitors.  She has tried multiple inhalers w/o improvement.  She has frequent exacerbations requiring systemic steroid use.  She had allergic reaction to [employer].  She had previous lab test showing elevated eosinophil count.  More recent lab test was done while she was on prednisone.    Assessment/Plan:  Severe persistent asthma with eosinophilic phenotype. Mild bronchiectasis on CT chest. History of cocaine use. - restart prednisone at 20 mg daily and taper to 5 mg daily - will try to get approval for fasenra - continue pulmicort, singulair, and prn albuterol  Upper airway cough syndrome with post nasal drip. - flonase, singulair, OTC antihistamine  Obstructive sleep apnea. - she reports benefit from CPAP  Dyspnea on exertion. - she is not able to walk more than 200 feet w/o stopping to rest - completed handicap parking form for 6 months  Right knee pain. - she is scheduled for surgery with Dr. Erlinda Hong later this month - will need to  assess her pulmonary status prior to her having surgery   Patient Instructions  Prednisone 5 mg pill >> 4 pills daily for 3 days, 3 pills daily for 3 days, 2 pills daily for 3 days, then 1 pill daily  Will start process to get approval for fasenra to treat eosinophilic asthma  Follow up in 1 week with Dr. Halford Chessman or Nurse Practitioner    Chesley Mires, MD [place] 07/10/2018, 11:57 AM  Flow Sheet  Pulmonary tests: CT chest4/15/14 >>no PE, no acute findings Labs 02/11/13 >> ANA negative,  Anti-GBM Ab < 1, ANCA negative PFT 05/15/13>>FEV1 1.90 (76%), FEV1% 72, TLC 4.23 (81%), DLCO 89%, no BD PFT 03/03/17 >> FEV1 1.57 (64%), FEV1% 64, TLC 4.47 (84%), DLCO 685, + BD RAST 07/28/17 >> mild reaction to trees, ragweed; IgE 446 HRCT chest 03/12/18 >> mild BTX  Sleep tests: PSG 04/19/13 >> AHI 16.4, SpO2 low 84%.  Cardiac tests: Echo 07/09/17 >> EF 60 to 65%  Events: 04/13/18 Allergic reaction to xolair  Past Medical History: She  has a past medical history of Arthritis, Asthma, CHF (congestive heart failure) ([place]) (2016), Cocaine abuse ([place]), Critical illness myopathy (April 2014), Dyspnea, GERD (gastroesophageal reflux disease), Hypertension, Influenza B (April 2014), OSA on CPAP ("since " 03/20/2013), Pneumonia (2016), Required emergent intubation, Tobacco abuse, and Upper airway cough syndrome.  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  Family History: Her family history includes HIV/AIDS in her father; Hypertension in her mother.  Social History: She  reports that she quit smoking about 5 years ago. Her smoking use included cigarettes. She has a 5.00 pack-year smoking history. She has never used smokeless tobacco. She reports that she does not drink alcohol or use drugs.  Medications: Allergies as of 07/10/2018      Reactions   Tomato Hives, Itching, Other (See Comments)   ALSO REACTS TO KETCHUP   Latex Itching, Rash   Wool Alcohol [lanolin] Itching      Medication List        Accurate as of 07/10/18 11:57 AM. Always use your most recent med list.          albuterol (2.5 MG/3ML) 0.083% nebulizer solution Commonly known as:  PROVENTIL Take 3 mLs (2.5 mg total) by nebulization every 6 (six) hours as needed for wheezing or shortness of breath.   albuterol 108 (90 Base) MCG/ACT  inhaler Commonly known as:  PROVENTIL HFA;VENTOLIN HFA Inhale 2 puffs into the lungs every 6 (six) hours as needed for wheezing or shortness of breath.   budesonide 0.25 MG/2ML nebulizer solution Commonly known as:  PULMICORT Take 2 mLs (0.25 mg total) by nebulization 2 (two) times daily.   fluticasone 50 MCG/ACT nasal spray Commonly known as:  FLONASE Place 1 spray into both nostrils daily.   guaiFENesin 600 MG 12 hr tablet Commonly known as:  MUCINEX Take 1 tablet (600 mg total) by mouth 2 (two) times daily.   montelukast 10 MG tablet Commonly known as:  SINGULAIR Take 1 tablet (10 mg total) by mouth at bedtime.   predniSONE 5 MG tablet Commonly known as:  DELTASONE 4 pills daily for 3 days, 3 pills daily for 3 days, 2 pills daily for 3 days, then 1 pill daily

## 2018-07-10 NOTE — Patient Instructions (Signed)
Prednisone 5 mg pill >> 4 pills daily for 3 days, 3 pills daily for 3 days, 2 pills daily for 3 days, then 1 pill daily  Will start process to get approval for fasenra to treat eosinophilic asthma  Follow up in 1 week with Dr. Craige CottaSood or Nurse Practitioner

## 2018-07-11 ENCOUNTER — Other Ambulatory Visit: Payer: Self-pay | Admitting: Pulmonary Disease

## 2018-07-11 ENCOUNTER — Telehealth: Payer: Self-pay | Admitting: Pulmonary Disease

## 2018-07-11 DIAGNOSIS — J45901 Unspecified asthma with (acute) exacerbation: Secondary | ICD-10-CM | POA: Diagnosis not present

## 2018-07-11 DIAGNOSIS — R269 Unspecified abnormalities of gait and mobility: Secondary | ICD-10-CM | POA: Diagnosis not present

## 2018-07-11 DIAGNOSIS — J9690 Respiratory failure, unspecified, unspecified whether with hypoxia or hypercapnia: Secondary | ICD-10-CM | POA: Diagnosis not present

## 2018-07-11 DIAGNOSIS — Z96659 Presence of unspecified artificial knee joint: Secondary | ICD-10-CM | POA: Diagnosis not present

## 2018-07-11 DIAGNOSIS — J4551 Severe persistent asthma with (acute) exacerbation: Secondary | ICD-10-CM | POA: Diagnosis not present

## 2018-07-11 DIAGNOSIS — M1712 Unilateral primary osteoarthritis, left knee: Secondary | ICD-10-CM | POA: Diagnosis not present

## 2018-07-11 DIAGNOSIS — R05 Cough: Secondary | ICD-10-CM | POA: Diagnosis not present

## 2018-07-11 DIAGNOSIS — L03119 Cellulitis of unspecified part of limb: Secondary | ICD-10-CM | POA: Diagnosis not present

## 2018-07-11 DIAGNOSIS — N179 Acute kidney failure, unspecified: Secondary | ICD-10-CM | POA: Diagnosis not present

## 2018-07-11 DIAGNOSIS — J188 Other pneumonia, unspecified organism: Secondary | ICD-10-CM | POA: Diagnosis not present

## 2018-07-11 DIAGNOSIS — G4733 Obstructive sleep apnea (adult) (pediatric): Secondary | ICD-10-CM | POA: Diagnosis not present

## 2018-07-11 DIAGNOSIS — J45909 Unspecified asthma, uncomplicated: Secondary | ICD-10-CM | POA: Diagnosis not present

## 2018-07-11 DIAGNOSIS — J45998 Other asthma: Secondary | ICD-10-CM | POA: Diagnosis not present

## 2018-07-11 MED ORDER — AEROCHAMBER MV MISC: 2 refills | Status: DC

## 2018-07-11 NOTE — Telephone Encounter (Signed)
Formed signed

## 2018-07-11 NOTE — Telephone Encounter (Signed)
[employer] forms have been signed by patient and completed Placed in VS to do folder today  VS please let me know when signed and completed. Once completed will need to fax it off to be processed.

## 2018-07-13 ENCOUNTER — Telehealth (INDEPENDENT_AMBULATORY_CARE_PROVIDER_SITE_OTHER): Payer: Self-pay | Admitting: Orthopaedic Surgery

## 2018-07-13 NOTE — Telephone Encounter (Signed)
I called patient and rescheduled for 08/13/18.

## 2018-07-13 NOTE — Telephone Encounter (Signed)
Patient called wanting to move surgery date out to the 07/30/18.  Please call patient to discuss requested surgery date.  [phone]

## 2018-07-13 NOTE — Telephone Encounter (Signed)
Called Makaylia regarding message below. No answer LMOM to return call.

## 2018-07-13 NOTE — Telephone Encounter (Signed)
Lorie/[facility]/[phone] called to state she was left a vm from [place] office but could not understand what the message was.  Please call her back. [phone]

## 2018-07-13 NOTE — Telephone Encounter (Signed)
Called patient she states she has received her hospital bed but noone has contacted her for home health aide, she will find our why [facility].   Patient will call us back.  Patient also wanted to R/S SU Date and Pre Op appt. Transferred call to [employer].

## 2018-07-16 NOTE — Telephone Encounter (Signed)
[facility] no answer LMOM to return call. Need to know why they will not be able to provide a home health aide for patient. Patient would like to know why they can't.

## 2018-07-16 NOTE — Pre-Procedure Instructions (Signed)
Kathleen Rodriguez  07/16/2018      [id number]- [place], [place] - [address] [place] [id number] Phone: [phone] Fax: [phone]    Your procedure is scheduled on Monday September 30.  [facility] at 5:30 A.M.  Call this number if you have problems the morning of surgery:  [phone]   Remember:  Do not eat or drink after midnight.    Take these medicines the morning of surgery with A SIP OF WATER:   Pantoprazole Pulmicort Prednisone Albuterol inhaler if needed (please bring inhaler to hospital with you) Albuterol nebulizer if needed Acetaminophen (tylenol) if needed Flonase if needed  7 days prior to surgery STOP taking any Aspirin(unless otherwise instructed by your surgeon), Aleve, Naproxen, Ibuprofen, Motrin, Advil, Goody's, BC's, all herbal medications, fish oil, and all vitamins     Do not wear jewelry, make-up or nail polish.  Do not wear lotions, powders, or perfumes, or deodorant.  Do not shave 48 hours prior to surgery.  Men may shave face and neck.  Do not bring valuables to the hospital.  [facility] is not responsible for any belongings or valuables.  Contacts, dentures or bridgework may not be worn into surgery.  Leave your suitcase in the car.  After surgery it may be brought to your room.  For patients admitted to the hospital, discharge time will be determined by your treatment team.  Patients discharged the day of surgery will not be allowed to drive home.   Special instructions:    [facility]- Preparing For Surgery  Before surgery, you can play an important role. Because skin is not sterile, your skin needs to be as free of germs as possible. You can reduce the number of germs on your skin by washing with CHG (chlorahexidine gluconate) Soap before surgery.  CHG is an antiseptic cleaner which kills germs and bonds with the skin to continue killing germs even after  washing.    Oral Hygiene is also important to reduce your risk of infection.  Remember - BRUSH YOUR TEETH THE MORNING OF SURGERY WITH YOUR REGULAR TOOTHPASTE  Please do not use if you have an allergy to CHG or antibacterial soaps. If your skin becomes reddened/irritated stop using the CHG.  Do not shave (including legs and underarms) for at least 48 hours prior to first CHG shower. It is OK to shave your face.  Please follow these instructions carefully.   1. Shower the NIGHT BEFORE SURGERY and the MORNING OF SURGERY with CHG.   2. If you chose to wash your hair, wash your hair first as usual with your normal shampoo.  3. After you shampoo, rinse your hair and body thoroughly to remove the shampoo.  4. Use CHG as you would any other liquid soap. You can apply CHG directly to the skin and wash gently with a scrungie or a clean washcloth.   5. Apply the CHG Soap to your body ONLY FROM THE NECK DOWN.  Do not use on open wounds or open sores. Avoid contact with your eyes, ears, mouth and genitals (private parts). Wash Face and genitals (private parts)  with your normal soap.  6. Wash thoroughly, paying special attention to the area where your surgery will be performed.  7. Thoroughly rinse your body with warm water from the neck down.  8. DO NOT shower/wash with your normal soap after using and rinsing off the CHG Soap.  9. Pat yourself dry with a CLEAN TOWEL.  10. Wear CLEAN PAJAMAS to bed the night before surgery, wear comfortable clothes the morning of surgery  11. Place CLEAN SHEETS on your bed the night of your first shower and DO NOT SLEEP WITH PETS.    Day of Surgery:  Do not apply any deodorants/lotions.  Please wear clean clothes to the hospital/surgery center.   Remember to brush your teeth WITH YOUR REGULAR TOOTHPASTE.    Please read over the following fact sheets that you were given. Coughing and Deep Breathing, MRSA Information and Surgical Site Infection  Prevention

## 2018-07-17 ENCOUNTER — Inpatient Hospital Stay (HOSPITAL_COMMUNITY)
Admission: RE | Admit: 2018-07-17 | Discharge: 2018-07-17 | Disposition: A | Discharge status: Home / Self Care | Payer: Medicaid Other | Source: Ambulatory Visit

## 2018-07-19 ENCOUNTER — Other Ambulatory Visit: Payer: Self-pay | Admitting: Pulmonary Disease

## 2018-07-19 ENCOUNTER — Telehealth: Payer: Self-pay | Admitting: Pulmonary Disease

## 2018-07-19 NOTE — Telephone Encounter (Signed)
Routing to [place] NP  as she DOD.

## 2018-07-19 NOTE — Telephone Encounter (Signed)
Appointment has been made nothing further need.

## 2018-07-19 NOTE — Telephone Encounter (Signed)
Returned call to Patient.  She stated that she has been feeling bad the last few days.  She stated that she recently finished taking Prednisone.She is complaining of runny nose, productive cough with tan colored phlegm, head and chest congestion, feels tired, but is unable to sleep.  She stated that she could not come in to see a NP, because she is using her walker and her feet hurt.  She stated that she was scheduled for knee replacement 07/24/18, but it has been postpone for 08/15/18.  She stated that she would like a new prescription for Prednisone, because she felt that it made a difference, when she was on it.  [facility] [address]  Dr. Craige Cotta, please advise

## 2018-07-19 NOTE — Telephone Encounter (Signed)
She probably needs to be seen since she has completed the taper already. Thanks.

## 2018-07-20 ENCOUNTER — Ambulatory Visit: Payer: Medicaid Other | Admitting: Primary Care

## 2018-07-20 NOTE — Progress Notes (Deleted)
@Patient  ID: Kathleen Rodriguez, female    DOB: 10/07/1966, 52 y.o.   MRN: [id number]  No chief complaint on file.   Referring provider: Hoy Register, MD  HPI: 52 year old female, former smoker. PMH severe persistent eosinophilic asthma, mild bronchiectasis, OSA. Patient of Dr. Craige Cotta, last seen on 07/10/18 for sob, cough and chest congestion. Given prednisone taper. Continue Pulmicort, Singulair and prn albuterol. In the process of getting approved for [facility]. Recommended to FU in 1 week.  Patient called office on 09/26 with complaints of not feeling well with sob and weakness. Patient stated that she didn't want to come in for a visit. Recommended to come in for OV d/t symptoms.   07/20/2018 Patient presents today for acute visit with complaints of fatigue, runny nose, head and chest congestion, productive cough with tan colored sputum.         Allergies  Allergen Reactions  . Tomato Hives, Itching and Other (See Comments)    ALSO REACTS TO KETCHUP  . Latex Itching and Rash  . Wool Alcohol [Lanolin] Itching    Immunization History  Administered Date(s) Administered  . Influenza,inj,Quad PF,6+ Mos 08/13/2013, 07/27/2015, 07/21/2016, 10/22/2017  . Tdap 08/14/2013    Past Medical History:  Diagnosis Date  . Arthritis    "knees, lower back; legs, ankles" (01/27/2016)  . Asthma    followed by Dr. Craige Cotta  . CHF (congestive heart failure) (HCC) 2016   "when I went into a coma"  . Cocaine abuse (HCC)   . 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 Dyspnea   . GERD (gastroesophageal reflux disease)   . Hypertension    "doctor took me off RX in 2016" (01/27/2016)  . Influenza B April 2014   Complicated by multi-organ failure  . OSA on CPAP "since " 03/20/2013  . Pneumonia 2016  . Required emergent intubation    asthma exacerbation in 2016  . Tobacco abuse   . Upper airway cough syndrome     Tobacco History: Social History   Tobacco Use  Smoking Status Former Smoker  .  Packs/day: 0.25  . Years: 20.00  . Pack years: 5.00  . Types: Cigarettes  . Last attempt to quit: 01/31/2013  . Years since quitting: 5.4  Smokeless Tobacco Never Used   Counseling given: Not Answered   Outpatient Medications Prior to Visit  Medication Sig Dispense Refill  . acetaminophen (TYLENOL) 500 MG tablet Take 1,000 mg by mouth 2 (two) times daily as needed for headache.    . albuterol (PROVENTIL HFA;VENTOLIN HFA) 108 (90 Base) MCG/ACT inhaler Inhale 2 puffs into the lungs every 6 (six) hours as needed for wheezing or shortness of breath. 1 Inhaler 6  . albuterol (PROVENTIL) (2.5 MG/3ML) 0.083% nebulizer solution Take 3 mLs (2.5 mg total) by nebulization every 6 (six) hours as needed for wheezing or shortness of breath. 75 mL 0  . budesonide (PULMICORT) 0.25 MG/2ML nebulizer solution Take 2 mLs (0.25 mg total) by nebulization 2 (two) times daily. (Patient taking differently: Take 0.25 mg by nebulization 3 (three) times daily. ) 360 mL 3  . fluticasone (FLONASE) 50 MCG/ACT nasal spray Place 1 spray into both nostrils daily. (Patient taking differently: Place 1 spray into both nostrils daily as needed for allergies. ) 16 g 5  . guaiFENesin (MUCINEX) 600 MG 12 hr tablet Take 1 tablet (600 mg total) by mouth 2 (two) times daily. (Patient taking differently: Take 1,200 mg by mouth 2 (two) times daily as needed (  congestion). ) 14 tablet 0  . montelukast (SINGULAIR) 10 MG tablet Take 1 tablet (10 mg total) by mouth at bedtime. 30 tablet 5  . pantoprazole (PROTONIX) 40 MG tablet Take 40 mg by mouth daily as needed (acid reflux).    . predniSONE (DELTASONE) 5 MG tablet 4 pills daily for 3 days, 3 pills daily for 3 days, 2 pills daily for 3 days, then 1 pill daily 60 tablet 2  . Spacer/Aero-Holding Chambers (AEROCHAMBER MV) inhaler Use as instructed 1 each 2   Facility-Administered Medications Prior to Visit  Medication Dose Route Frequency Provider Last Rate Last Dose  . levalbuterol (XOPENEX)  nebulizer solution 0.63 mg  0.63 mg Nebulization Q6H [name], MD   0.63 mg at 04/13/18 1031      Review of Systems  Review of Systems   Physical Exam  There were no vitals taken for this visit. Physical Exam   Lab Results:  CBC    Component Value Date/Time   WBC 16.9 (H) 12/01/2017 0437   RBC 4.02 12/01/2017 0437   HGB 10.8 (L) 12/01/2017 0437   HCT 34.3 (L) 12/01/2017 0437   PLT 389 12/01/2017 0437   MCV 85.3 12/01/2017 0437   MCH 26.9 12/01/2017 0437   MCHC 31.5 12/01/2017 0437   RDW 18.6 (H) 12/01/2017 0437   LYMPHSABS 1.5 12/01/2017 0437   MONOABS 1.0 12/01/2017 0437   EOSABS 0.0 12/01/2017 0437   BASOSABS 0.0 12/01/2017 0437    BMET    Component Value Date/Time   NA 141 12/01/2017 0437   K 4.2 12/01/2017 0437   CL 106 12/01/2017 0437   CO2 24 12/01/2017 0437   GLUCOSE 116 (H) 12/01/2017 0437   BUN 16 12/01/2017 0437   CREATININE 0.82 12/01/2017 0437   CALCIUM 8.3 (L) 12/01/2017 0437   GFRNONAA >60 12/01/2017 0437   GFRAA >60 12/01/2017 0437    BNP    Component Value Date/Time   BNP 97.0 11/30/2017 0904    ProBNP    Component Value Date/Time   PROBNP 97.2 07/01/2014 1740    Imaging: Xr Knee Complete 4 Views Right  Result Date: 07/03/2018 End-stage degenerative joint disease with varus deformity.  Stable left total knee replacement    Assessment & Plan:   No problem-specific Assessment & Plan notes found for this encounter.     Glenford Bayley, NP 07/20/2018

## 2018-07-23 ENCOUNTER — Telehealth: Payer: Self-pay | Admitting: Pulmonary Disease

## 2018-07-23 NOTE — Telephone Encounter (Signed)
Called spoke with patient, was supposed to have 1 week ROV with VS. Patient also c/o yeast infection from prednisone, wants diflucan and albuterol.   Made appointment with Angus Seller NP for tomorrow 07/24/18 at 0900.   Nothing further needed.

## 2018-07-24 ENCOUNTER — Ambulatory Visit: Payer: Medicaid Other | Admitting: Nurse Practitioner

## 2018-07-24 DIAGNOSIS — J45998 Other asthma: Secondary | ICD-10-CM | POA: Diagnosis not present

## 2018-07-24 DIAGNOSIS — J4551 Severe persistent asthma with (acute) exacerbation: Secondary | ICD-10-CM | POA: Diagnosis not present

## 2018-07-24 DIAGNOSIS — Z96659 Presence of unspecified artificial knee joint: Secondary | ICD-10-CM | POA: Diagnosis not present

## 2018-07-24 DIAGNOSIS — J9690 Respiratory failure, unspecified, unspecified whether with hypoxia or hypercapnia: Secondary | ICD-10-CM | POA: Diagnosis not present

## 2018-07-24 DIAGNOSIS — N179 Acute kidney failure, unspecified: Secondary | ICD-10-CM | POA: Diagnosis not present

## 2018-07-24 DIAGNOSIS — J188 Other pneumonia, unspecified organism: Secondary | ICD-10-CM | POA: Diagnosis not present

## 2018-07-24 DIAGNOSIS — R05 Cough: Secondary | ICD-10-CM | POA: Diagnosis not present

## 2018-07-24 DIAGNOSIS — M1712 Unilateral primary osteoarthritis, left knee: Secondary | ICD-10-CM | POA: Diagnosis not present

## 2018-07-24 DIAGNOSIS — R269 Unspecified abnormalities of gait and mobility: Secondary | ICD-10-CM | POA: Diagnosis not present

## 2018-07-24 DIAGNOSIS — J45901 Unspecified asthma with (acute) exacerbation: Secondary | ICD-10-CM | POA: Diagnosis not present

## 2018-07-24 DIAGNOSIS — G4733 Obstructive sleep apnea (adult) (pediatric): Secondary | ICD-10-CM | POA: Diagnosis not present

## 2018-07-24 DIAGNOSIS — J45909 Unspecified asthma, uncomplicated: Secondary | ICD-10-CM | POA: Diagnosis not present

## 2018-07-26 ENCOUNTER — Ambulatory Visit: Payer: Medicaid Other | Admitting: Nurse Practitioner

## 2018-07-30 ENCOUNTER — Inpatient Hospital Stay (HOSPITAL_COMMUNITY)
Admission: EM | Admit: 2018-07-30 | Discharge: 2018-08-02 | DRG: 872 | Disposition: A | Discharge status: Home / Self Care | Payer: Medicaid Other | Attending: Internal Medicine | Admitting: Internal Medicine

## 2018-07-30 ENCOUNTER — Other Ambulatory Visit: Payer: Self-pay

## 2018-07-30 ENCOUNTER — Emergency Department (HOSPITAL_COMMUNITY): Payer: Medicaid Other

## 2018-07-30 ENCOUNTER — Encounter (HOSPITAL_COMMUNITY): Payer: Self-pay | Admitting: Emergency Medicine

## 2018-07-30 DIAGNOSIS — N179 Acute kidney failure, unspecified: Secondary | ICD-10-CM | POA: Diagnosis present

## 2018-07-30 DIAGNOSIS — R6510 Systemic inflammatory response syndrome (SIRS) of non-infectious origin without acute organ dysfunction: Secondary | ICD-10-CM | POA: Diagnosis present

## 2018-07-30 DIAGNOSIS — I5032 Chronic diastolic (congestive) heart failure: Secondary | ICD-10-CM | POA: Diagnosis present

## 2018-07-30 DIAGNOSIS — W010XXA Fall on same level from slipping, tripping and stumbling without subsequent striking against object, initial encounter: Secondary | ICD-10-CM | POA: Diagnosis present

## 2018-07-30 DIAGNOSIS — Z79899 Other long term (current) drug therapy: Secondary | ICD-10-CM

## 2018-07-30 DIAGNOSIS — R197 Diarrhea, unspecified: Secondary | ICD-10-CM

## 2018-07-30 DIAGNOSIS — E669 Obesity, unspecified: Secondary | ICD-10-CM | POA: Diagnosis present

## 2018-07-30 DIAGNOSIS — L03116 Cellulitis of left lower limb: Secondary | ICD-10-CM | POA: Diagnosis present

## 2018-07-30 DIAGNOSIS — R0602 Shortness of breath: Secondary | ICD-10-CM | POA: Diagnosis not present

## 2018-07-30 DIAGNOSIS — A419 Sepsis, unspecified organism: Principal | ICD-10-CM | POA: Diagnosis present

## 2018-07-30 DIAGNOSIS — Z6841 Body Mass Index (BMI) 40.0 and over, adult: Secondary | ICD-10-CM

## 2018-07-30 DIAGNOSIS — Z23 Encounter for immunization: Secondary | ICD-10-CM

## 2018-07-30 DIAGNOSIS — J4551 Severe persistent asthma with (acute) exacerbation: Secondary | ICD-10-CM | POA: Diagnosis present

## 2018-07-30 DIAGNOSIS — I110 Hypertensive heart disease with heart failure: Secondary | ICD-10-CM | POA: Diagnosis present

## 2018-07-30 DIAGNOSIS — K219 Gastro-esophageal reflux disease without esophagitis: Secondary | ICD-10-CM | POA: Diagnosis present

## 2018-07-30 DIAGNOSIS — M7989 Other specified soft tissue disorders: Secondary | ICD-10-CM | POA: Diagnosis present

## 2018-07-30 DIAGNOSIS — R591 Generalized enlarged lymph nodes: Secondary | ICD-10-CM | POA: Diagnosis present

## 2018-07-30 DIAGNOSIS — Z96652 Presence of left artificial knee joint: Secondary | ICD-10-CM | POA: Diagnosis present

## 2018-07-30 DIAGNOSIS — I10 Essential (primary) hypertension: Secondary | ICD-10-CM | POA: Diagnosis present

## 2018-07-30 DIAGNOSIS — T380X5A Adverse effect of glucocorticoids and synthetic analogues, initial encounter: Secondary | ICD-10-CM | POA: Diagnosis present

## 2018-07-30 DIAGNOSIS — Z8249 Family history of ischemic heart disease and other diseases of the circulatory system: Secondary | ICD-10-CM

## 2018-07-30 DIAGNOSIS — R112 Nausea with vomiting, unspecified: Secondary | ICD-10-CM | POA: Diagnosis present

## 2018-07-30 DIAGNOSIS — Z7951 Long term (current) use of inhaled steroids: Secondary | ICD-10-CM

## 2018-07-30 DIAGNOSIS — R0689 Other abnormalities of breathing: Secondary | ICD-10-CM | POA: Diagnosis not present

## 2018-07-30 DIAGNOSIS — R05 Cough: Secondary | ICD-10-CM | POA: Diagnosis not present

## 2018-07-30 DIAGNOSIS — G4733 Obstructive sleep apnea (adult) (pediatric): Secondary | ICD-10-CM | POA: Diagnosis present

## 2018-07-30 DIAGNOSIS — Z87891 Personal history of nicotine dependence: Secondary | ICD-10-CM

## 2018-07-30 DIAGNOSIS — J449 Chronic obstructive pulmonary disease, unspecified: Secondary | ICD-10-CM | POA: Diagnosis present

## 2018-07-30 DIAGNOSIS — R739 Hyperglycemia, unspecified: Secondary | ICD-10-CM | POA: Diagnosis present

## 2018-07-30 DIAGNOSIS — R000 Tachycardia, unspecified: Secondary | ICD-10-CM | POA: Diagnosis not present

## 2018-07-30 DIAGNOSIS — R21 Rash and other nonspecific skin eruption: Secondary | ICD-10-CM | POA: Diagnosis not present

## 2018-07-30 DIAGNOSIS — J45901 Unspecified asthma with (acute) exacerbation: Secondary | ICD-10-CM | POA: Diagnosis present

## 2018-07-30 LAB — CBC WITH DIFFERENTIAL/PLATELET
BASOS ABS: 0.1 10*3/uL (ref 0.0–0.1)
Basophils Relative: 1 %
EOS ABS: 0 10*3/uL (ref 0.0–0.7)
Eosinophils Relative: 0 %
HCT: 44.1 % (ref 36.0–46.0)
Hemoglobin: 13.8 g/dL (ref 12.0–15.0)
Lymphocytes Relative: 16 %
Lymphs Abs: 1.7 10*3/uL (ref 0.7–4.0)
MCH: 26 pg (ref 26.0–34.0)
MCHC: 31.3 g/dL (ref 30.0–36.0)
MCV: 83.1 fL (ref 78.0–100.0)
MONO ABS: 1.4 10*3/uL — AB (ref 0.1–1.0)
Monocytes Relative: 13 %
NEUTROS PCT: 70 %
Neutro Abs: 7.7 10*3/uL (ref 1.7–7.7)
PLATELETS: 182 10*3/uL (ref 150–400)
RBC: 5.31 MIL/uL — ABNORMAL HIGH (ref 3.87–5.11)
RDW: 19.4 % — AB (ref 11.5–15.5)
WBC: 10.9 10*3/uL — ABNORMAL HIGH (ref 4.0–10.5)

## 2018-07-30 LAB — URINALYSIS, COMPLETE (UACMP) WITH MICROSCOPIC
Bilirubin Urine: NEGATIVE
GLUCOSE, UA: NEGATIVE mg/dL
KETONES UR: 5 mg/dL — AB
Leukocytes, UA: NEGATIVE
Nitrite: NEGATIVE
PH: 5 (ref 5.0–8.0)
Protein, ur: 100 mg/dL — AB
SPECIFIC GRAVITY, URINE: 1.028 (ref 1.005–1.030)

## 2018-07-30 LAB — COMPREHENSIVE METABOLIC PANEL
ALK PHOS: 48 U/L (ref 38–126)
ALT: 27 U/L (ref 0–44)
ANION GAP: 13 (ref 5–15)
AST: 46 U/L — ABNORMAL HIGH (ref 15–41)
Albumin: 2.7 g/dL — ABNORMAL LOW (ref 3.5–5.0)
BILIRUBIN TOTAL: 1.7 mg/dL — AB (ref 0.3–1.2)
BUN: 12 mg/dL (ref 6–20)
CALCIUM: 8.4 mg/dL — AB (ref 8.9–10.3)
CO2: 22 mmol/L (ref 22–32)
CREATININE: 1.53 mg/dL — AB (ref 0.44–1.00)
Chloride: 95 mmol/L — ABNORMAL LOW (ref 98–111)
GFR, EST AFRICAN AMERICAN: 44 mL/min — AB (ref >60)
GFR, EST NON AFRICAN AMERICAN: 38 mL/min — AB (ref >60)
Glucose, Bld: 130 mg/dL — ABNORMAL HIGH (ref 70–99)
Potassium: 5.2 mmol/L — ABNORMAL HIGH (ref 3.5–5.1)
Sodium: 130 mmol/L — ABNORMAL LOW (ref 135–145)
TOTAL PROTEIN: 7.2 g/dL (ref 6.5–8.1)

## 2018-07-30 LAB — I-STAT CG4 LACTIC ACID, ED
LACTIC ACID, VENOUS: 1.32 mmol/L (ref 0.5–1.9)
Lactic Acid, Venous: 1.94 mmol/L — ABNORMAL HIGH (ref 0.5–1.9)

## 2018-07-30 LAB — PREGNANCY, URINE: Preg Test, Ur: NEGATIVE

## 2018-07-30 MED ORDER — SODIUM CHLORIDE 0.9 % IV SOLN
2.0000 g | INTRAVENOUS | Status: DC
  Administered 2018-07-30 – 2018-08-01 (×3): 2 g via INTRAVENOUS
  Filled 2018-07-30 (×4): qty 20

## 2018-07-30 MED ORDER — SODIUM CHLORIDE 0.9 % IV SOLN: INTRAVENOUS | Status: DC | PRN

## 2018-07-30 MED ORDER — LACTATED RINGERS IV BOLUS (SEPSIS)
1000.0000 mL | Freq: Once | INTRAVENOUS | Status: AC
  Administered 2018-07-30: 1000 mL via INTRAVENOUS

## 2018-07-30 MED ORDER — VANCOMYCIN HCL 10 G IV SOLR
1250.0000 mg | INTRAVENOUS | Status: DC
  Administered 2018-07-31: 1250 mg via INTRAVENOUS
  Filled 2018-07-30 (×2): qty 1250

## 2018-07-30 MED ORDER — HYDROCODONE-ACETAMINOPHEN 5-325 MG PO TABS
1.0000 | ORAL_TABLET | Freq: Once | ORAL | Status: AC
  Administered 2018-07-30: 1 via ORAL
  Filled 2018-07-30: qty 1

## 2018-07-30 MED ORDER — METRONIDAZOLE IN NACL 5-0.79 MG/ML-% IV SOLN
500.0000 mg | Freq: Once | INTRAVENOUS | Status: AC
  Administered 2018-07-31: 500 mg via INTRAVENOUS
  Filled 2018-07-30: qty 100

## 2018-07-30 MED ORDER — LACTATED RINGERS IV BOLUS (SEPSIS)
500.0000 mL | Freq: Once | INTRAVENOUS | Status: AC
  Administered 2018-07-30: 500 mL via INTRAVENOUS

## 2018-07-30 MED ORDER — VANCOMYCIN HCL IN DEXTROSE 1-5 GM/200ML-% IV SOLN
1000.0000 mg | Freq: Once | INTRAVENOUS | Status: AC
  Administered 2018-07-30: 1000 mg via INTRAVENOUS
  Filled 2018-07-30: qty 200

## 2018-07-30 MED ORDER — SODIUM CHLORIDE 0.9 % IV SOLN
1.0000 g | INTRAVENOUS | Status: DC
  Filled 2018-07-30: qty 10

## 2018-07-30 NOTE — ED Triage Notes (Signed)
Per EMS pt from home c/o nausea, diarrhea for 4 days, 2 days ago rash began on L tibia. Pt has asthma, SOB and wheezing throughout lung fields. Pt also is dizzy when standing or ambulating. 125 mg solumedrol, 4mg  zofran, 10 mg albuterol, saline. After medication pt still SOB at rest

## 2018-07-30 NOTE — ED Provider Notes (Signed)
[facility] EMERGENCY DEPARTMENT Provider Note   CSN: [id number] Arrival date & time: 07/30/18  1906     History   Chief Complaint Chief Complaint  Patient presents with  . Shortness of Breath    HPI Kathleen Rodriguez is a 52 y.o. female.  HPI  52 year old female previously for asthma, CHF, cocaine abuse, OSA who presents with respiratory distress.  Patient is for last 4 days she has had multiple complaints.  Endorses 4 days of nonbloody diarrhea, nausea, lower abdominal pain, shortness of breath associate with cough congestion runny nose and production of colored sputum, fever T-max 102 and rash to her left lower extremity.  Patient states she fell 2 days ago while ambulating around a chair and scraped her left shin.  Yesterday patient noticed redness to her left shin and tenderness.  Patient called EMS today due to shortness of breath who found her with diffuse wheezing and decreased movement.  Patient was given albuterol 10 mg, 125 mg Solu-Medrol, 4 mg Zofran and 200 mL normal saline on route via EMS.  Past Medical History:  Diagnosis Date  . Arthritis    "knees, lower back; legs, ankles" (01/27/2016)  . Asthma    followed by Dr. Craige Cotta  . CHF (congestive heart failure) (HCC) 2016   "when I went into a coma"  . Cocaine abuse (HCC)   . 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 Dyspnea   . GERD (gastroesophageal reflux disease)   . Hypertension    "doctor took me off RX in 2016" (01/27/2016)  . Influenza B April 2014   Complicated by multi-organ failure  . OSA on CPAP "since " 03/20/2013  . Pneumonia 2016  . Required emergent intubation    asthma exacerbation in 2016  . Tobacco abuse   . Upper airway cough syndrome     Patient Active Problem List   Diagnosis Date Noted  . SIRS (systemic inflammatory response syndrome) (HCC) 07/30/2018  . Asthma 10/22/2017  . Leukocytosis 07/11/2017  . Acute bronchitis   . Hypokalemia 07/10/2017  . Eczema 05/26/2017  .  Osteoarthritis of left knee 09/01/2016  . 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 Knee pain, 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  . Status asthmaticus 06/24/2016  . Acute respiratory failure (HCC) 06/24/2016  . Acute asthma exacerbation 05/12/2016  . Acute respiratory failure with hypoxia (HCC) 05/12/2016  . Tobacco abuse   . Primary osteoarthritis of left knee 03/09/2016  . Asthma with status asthmaticus 01/27/2016  . Acute respiratory distress 01/27/2016  . Non compliance with medical treatment 08/19/2015  . Anemia, iron deficiency 08/12/2015  . Essential hypertension 07/25/2015  . Sinus tachycardia (HCC) 07/25/2015  . Acute respiratory failure with hypercapnia (HCC) 07/24/2015  . COPD (chronic obstructive pulmonary disease) (HCC) 12/28/2014  . Tracheobronchitis 12/28/2014  . Dysfunctional uterine bleeding 12/28/2014  . Anemia 12/28/2014  . Pleuritic chest pain 05/16/2014  . Morbid obesity (HCC) 05/16/2014  . Chronic cough 05/01/2014  . Upper airway cough syndrome 05/01/2014  . Vaginal candidiasis 04/09/2014  . GERD (gastroesophageal reflux disease) 04/08/2014  . Medically noncompliant 04/07/2014  . Asthma exacerbation 04/06/2014  . CAP (community acquired pneumonia) 04/06/2014  . Chest pain 04/01/2014  . Hypertension 09/24/2013  . OSA (obstructive sleep apnea) 03/20/2013  . Protein calorie malnutrition (HCC) 02/13/2013  . Uncontrolled persistent asthma 02/05/2013    Past Surgical History:  Procedure Laterality Date  . BREAST SURGERY Right 1980   as a teenager , cyst was benign  . CESAREAN SECTION  2006  . LACERATION REPAIR Right ~ 1997   "tried to cut myself"  . 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 TUBAL LIGATION  2006     OB History   None      Home Medications    Prior to Admission medications   Medication Sig Start Date End Date  Taking? Authorizing Provider  acetaminophen (TYLENOL) 500 MG tablet Take 1,000 mg by mouth 2 (two) times daily as needed for headache.   Yes [provider]  albuterol (PROVENTIL HFA;VENTOLIN HFA) 108 (90 Base) MCG/ACT inhaler Inhale 2 puffs into the lungs every 6 (six) hours as needed for wheezing or shortness of breath. 04/13/18  Yes [name], MD  albuterol (PROVENTIL) (2.5 MG/3ML) 0.083% nebulizer solution Take 3 mLs (2.5 mg total) by nebulization every 6 (six) hours as needed for wheezing or shortness of breath. 02/19/18  Yes Sood, Laurier Nancy, MD  budesonide (PULMICORT) 0.25 MG/2ML nebulizer solution Take 2 mLs (0.25 mg total) by nebulization 2 (two) times daily. Patient taking differently: Take 0.25 mg by nebulization 3 (three) times daily.  02/19/18 02/19/19 Yes [name], MD  fluticasone (FLONASE) 50 MCG/ACT nasal spray Place 1 spray into both nostrils daily. Patient taking differently: Place 1 spray into both nostrils daily as needed for allergies.  02/19/18  Yes [name], MD  guaiFENesin (MUCINEX) 600 MG 12 hr tablet Take 1 tablet (600 mg total) by mouth 2 (two) times daily. Patient taking differently: Take 1,200 mg by mouth 2 (two) times daily as needed (congestion).  12/02/17  Yes Hongalgi, Maximino Greenland, MD  montelukast (SINGULAIR) 10 MG tablet Take 1 tablet (10 mg total) by mouth at bedtime. 06/06/18  Yes [name], MD  pantoprazole (PROTONIX) 40 MG tablet Take 40 mg by mouth daily as needed (acid reflux).   Yes [provider]  predniSONE (DELTASONE) 5 MG tablet 4 pills daily for 3 days, 3 pills daily for 3 days, 2 pills daily for 3 days, then 1 pill daily Patient not taking: Reported on 07/30/2018 07/10/18   [name], MD  Spacer/Aero-Holding Chambers (AEROCHAMBER MV) inhaler Use as instructed 07/11/18   [name], MD    Family History Family History  Problem Relation Age of Onset  . Hypertension Mother   . HIV/AIDS Father     Social History Social History    Tobacco Use  . Smoking status: Former Smoker    Packs/day: 0.25    Years: 20.00    Pack years: 5.00    Types: Cigarettes    Last attempt to quit: 01/31/2013    Years since quitting: 5.4  . Smokeless tobacco: Never Used  Substance Use Topics  . 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 Drug use: No    Types: "Crack" cocaine    Comment: 01/27/2016 "clean from smoking crack since 2007"     Allergies   Tomato; Latex; and Wool alcohol [lanolin]   Review of Systems Review of Systems  Constitutional: Positive for fever. Negative for chills.  HENT: Negative for ear pain and sore throat.   Eyes: Negative for pain and visual disturbance.  Respiratory: Positive for cough and shortness of breath.   Cardiovascular: Negative for chest pain and palpitations.  Gastrointestinal: Positive for abdominal pain, diarrhea and nausea. Negative for vomiting.  Genitourinary: Negative for dysuria and  hematuria.  Musculoskeletal: Negative for arthralgias and back pain.  Skin: Positive for rash and wound. Negative for color change.  Neurological: Negative for seizures and syncope.  All other systems reviewed and are negative.    Physical Exam Updated Vital Signs BP 119/74   Pulse 99   Temp (!) 101.8 F (38.8 C) (Oral)   Resp 20   Ht 5\' 5"  (1.651 m)   Wt 113 kg   SpO2 97%   BMI 41.46 kg/m   Physical Exam  Constitutional: She is oriented to person, place, and time. She appears well-developed and well-nourished.  Non-toxic appearance. She appears ill. She appears distressed.  Obese  HENT:  Head: Normocephalic and atraumatic.  Eyes: Pupils are equal, round, and reactive to light. Conjunctivae and EOM are normal.  Neck: Neck supple.  Cardiovascular: Normal rate and regular rhythm.  No murmur heard. Pulmonary/Chest: Accessory muscle usage present. Tachypnea noted. She is in respiratory distress. She has decreased breath sounds in the right lower field and the  left lower field. She has wheezes in the right upper field, the right middle field, the right lower field, the left upper field, the left middle field and the left lower field. She has no rhonchi. She has no rales.  Abdominal: Soft. Normal appearance. There is tenderness in the right lower quadrant, suprapubic area and left lower quadrant. There is CVA tenderness (Bilateral). There is no rigidity, no rebound, no guarding, no tenderness at McBurney's point and negative Murphy's sign.  Musculoskeletal:       Right lower leg: She exhibits no edema.       Left lower leg: She exhibits tenderness and edema.       Legs: Neurological: She is alert and oriented to person, place, and time.  Skin: Skin is warm and dry. Capillary refill takes less than 2 seconds.  Psychiatric: She has a normal mood and affect.  Nursing note and vitals reviewed.    ED Treatments / Results  Labs (all labs ordered are listed, but only abnormal results are displayed) Labs Reviewed  COMPREHENSIVE METABOLIC PANEL - Abnormal; Notable for the following components:      Result Value   Sodium 130 (*)    Potassium 5.2 (*)    Chloride 95 (*)    Glucose, Bld 130 (*)    Creatinine, Ser 1.53 (*)    Calcium 8.4 (*)    Albumin 2.7 (*)    AST 46 (*)    Total Bilirubin 1.7 (*)    GFR calc non Af Amer 38 (*)    GFR calc Af Amer 44 (*)    All other components within normal limits  CBC WITH DIFFERENTIAL/PLATELET - Abnormal; Notable for the following components:   WBC 10.9 (*)    RBC 5.31 (*)    RDW 19.4 (*)    Monocytes Absolute 1.4 (*)    All other components within normal limits  URINALYSIS, COMPLETE (UACMP) WITH MICROSCOPIC - Abnormal; Notable for the following components:   Color, Urine AMBER (*)    APPearance CLOUDY (*)    Hgb urine dipstick SMALL (*)    Ketones, ur 5 (*)    Protein, ur 100 (*)    Bacteria, UA RARE (*)    All other components within normal limits  I-STAT CG4 LACTIC ACID, ED - Abnormal; Notable for  the following components:   Lactic Acid, Venous 1.94 (*)    All other components within normal limits  CULTURE, BLOOD (ROUTINE X 2)  CULTURE, BLOOD (ROUTINE X 2)  URINE CULTURE  PREGNANCY, URINE  I-STAT CG4 LACTIC ACID, ED    EKG EKG Interpretation  Date/Time:  Monday July 30 2018 19:13:38 EDT Ventricular Rate:  123 PR Interval:    QRS Duration: 100 QT Interval:  316 QTC Calculation: 452 R Axis:   80 Text Interpretation:  Sinus tachycardia Non-specific ST-t changes No significant change since last tracing Confirmed by Cathren Laine ([id number]) on 07/30/2018 7:32:10 PM   Radiology Dg Chest 2 View  Result Date: 07/30/2018 CLINICAL DATA:  Cough and shortness of breath EXAM: CHEST - 2 VIEW COMPARISON:  Chest radiograph November 30, 2017 and chest CT Mar 12, 2018 FINDINGS: There is no edema or consolidation. Heart size and pulmonary vascularity are normal. No adenopathy. No pneumothorax. No bone lesions. IMPRESSION: No edema or consolidation. Electronically Signed   By: Bretta Bang III M.D.   On: 07/30/2018 20:55    Procedures Procedures (including critical care time)  Medications Ordered in ED Medications  cefTRIAXone (ROCEPHIN) 2 g in sodium chloride 0.9 % 100 mL IVPB ( Intravenous Stopped 07/30/18 2042)  0.9 %  sodium chloride infusion (has no administration in time range)  vancomycin (VANCOCIN) 1,250 mg in sodium chloride 0.9 % 250 mL IVPB (has no administration in time range)  metroNIDAZOLE (FLAGYL) IVPB 500 mg (has no administration in time range)  lactated ringers bolus 1,000 mL (0 mLs Intravenous Stopped 07/30/18 2300)    And  lactated ringers bolus 1,000 mL (0 mLs Intravenous Stopped 07/30/18 2300)    And  lactated ringers bolus 1,000 mL (0 mLs Intravenous Stopped 07/30/18 2300)    And  lactated ringers bolus 500 mL (500 mLs Intravenous New Bag/Given 07/30/18 2259)  vancomycin (VANCOCIN) IVPB 1000 mg/200 mL premix (0 mg Intravenous Stopped 07/30/18 2214)    HYDROcodone-acetaminophen (NORCO/VICODIN) 5-325 MG per tablet 1 tablet (1 tablet Oral Given 07/30/18 2256)     Initial Impression / Assessment and Plan / ED Course  I have reviewed the triage vital signs and the nursing notes.  Pertinent labs & imaging results that were available during my care of the patient were reviewed by me and considered in my medical decision making (see chart for details).     52 year old female previously for asthma, CHF, cocaine abuse, OSA who presents with respiratory distress.  History as above.  DDX includes asthma exacerbation complicated by pneumonia, pyelonephritis versus cystitis, skin infection representing cellulitis versus erysipelas.  Patient status post albuterol and Solu-Medrol..  Patient given duo nebs.  Patient febrile and tachycardic.  Activated as sepsis.  Blood cultures drawn.  Patient.  We will treating for suspected UTI/pyelonephritis.  Labs and imaging performed.  Significant for sodium 130, potassium 5.2 with hemolysis, chloride 95, currently 1.53 priors 0.8, initial lactic acid 1.94, repeat 1.32 status post fluids, WBC 10.9 with left shift.  CXR negative for pneumonia.  UA negative for infection.   Reevaluation, patient with tenderness in the left lower quadrant, in the presence of negative urine, CT abdomen pelvis performed to rule out diverticulitis.  CT pending. Flagyl added.  Patient admitted to hospital service for management of sepsis secondary to cellulitis versus potential diverticulitis with AKI and Asthma exacerbation.  Patient was seen in conduction with my attending Dr.Steinl, who agreed with plan and disposition.  Final Clinical Impressions(s) / ED Diagnoses   Final diagnoses:  Severe persistent asthma with exacerbation    ED Discharge Orders    None       Margit Banda,  MD 07/30/18 2356    Cathren Laine, MD 07/31/18 1228

## 2018-07-30 NOTE — Progress Notes (Signed)
Pharmacy Antibiotic Note  Kathleen Rodriguez is a 52 y.o. female admitted on 07/30/2018 with cellulitis.  Pharmacy has been consulted for vancomycin dosing. Ceftriaxone 2g q24h per MD.  Plan: -Vancomycin 1g x1, then 1250mg  q24h -Ceftriaxone 2g q24h per MD  Height: 5\' 5"  (165.1 cm) Weight: 249 lb 1.9 oz (113 kg) IBW/kg (Calculated) : 57  Temp (24hrs), Avg:101.8 F (38.8 C), Min:101.8 F (38.8 C), Max:101.8 F (38.8 C)  Recent Labs  Lab 07/30/18 1947  LATICACIDVEN 1.94*    CrCl cannot be calculated (Patient's most recent lab result is older than the maximum 21 days allowed.).    Allergies  Allergen Reactions  . Tomato Hives, Itching and Other (See Comments)    ALSO REACTS TO KETCHUP  . Latex Itching and Rash  . Wool Alcohol [Lanolin] Itching    Antimicrobials this admission: vancomycin 10/7 >>  ceftriaxone 10/7 >>   Dose adjustments this admission: N/A  Microbiology results: pending  Thank you for allowing pharmacy to be a part of this patient's care.  Koleen Nimrod 07/30/2018 8:54 PM

## 2018-07-31 ENCOUNTER — Encounter (HOSPITAL_COMMUNITY): Payer: Self-pay | Admitting: Internal Medicine

## 2018-07-31 ENCOUNTER — Observation Stay (HOSPITAL_COMMUNITY): Payer: Medicaid Other

## 2018-07-31 ENCOUNTER — Observation Stay (HOSPITAL_BASED_OUTPATIENT_CLINIC_OR_DEPARTMENT_OTHER): Payer: Medicaid Other

## 2018-07-31 DIAGNOSIS — J45901 Unspecified asthma with (acute) exacerbation: Secondary | ICD-10-CM

## 2018-07-31 DIAGNOSIS — I5032 Chronic diastolic (congestive) heart failure: Secondary | ICD-10-CM | POA: Diagnosis not present

## 2018-07-31 DIAGNOSIS — R591 Generalized enlarged lymph nodes: Secondary | ICD-10-CM | POA: Diagnosis not present

## 2018-07-31 DIAGNOSIS — N179 Acute kidney failure, unspecified: Secondary | ICD-10-CM

## 2018-07-31 DIAGNOSIS — R6510 Systemic inflammatory response syndrome (SIRS) of non-infectious origin without acute organ dysfunction: Secondary | ICD-10-CM

## 2018-07-31 DIAGNOSIS — Z7951 Long term (current) use of inhaled steroids: Secondary | ICD-10-CM | POA: Diagnosis not present

## 2018-07-31 DIAGNOSIS — R112 Nausea with vomiting, unspecified: Secondary | ICD-10-CM | POA: Diagnosis present

## 2018-07-31 DIAGNOSIS — Z23 Encounter for immunization: Secondary | ICD-10-CM | POA: Diagnosis not present

## 2018-07-31 DIAGNOSIS — R000 Tachycardia, unspecified: Secondary | ICD-10-CM | POA: Diagnosis not present

## 2018-07-31 DIAGNOSIS — Z6841 Body Mass Index (BMI) 40.0 and over, adult: Secondary | ICD-10-CM | POA: Diagnosis not present

## 2018-07-31 DIAGNOSIS — R600 Localized edema: Secondary | ICD-10-CM | POA: Diagnosis not present

## 2018-07-31 DIAGNOSIS — K219 Gastro-esophageal reflux disease without esophagitis: Secondary | ICD-10-CM | POA: Diagnosis not present

## 2018-07-31 DIAGNOSIS — Z87891 Personal history of nicotine dependence: Secondary | ICD-10-CM | POA: Diagnosis not present

## 2018-07-31 DIAGNOSIS — G4733 Obstructive sleep apnea (adult) (pediatric): Secondary | ICD-10-CM | POA: Diagnosis not present

## 2018-07-31 DIAGNOSIS — R05 Cough: Secondary | ICD-10-CM | POA: Diagnosis not present

## 2018-07-31 DIAGNOSIS — R197 Diarrhea, unspecified: Secondary | ICD-10-CM | POA: Diagnosis not present

## 2018-07-31 DIAGNOSIS — A419 Sepsis, unspecified organism: Secondary | ICD-10-CM | POA: Diagnosis not present

## 2018-07-31 DIAGNOSIS — R609 Edema, unspecified: Secondary | ICD-10-CM | POA: Diagnosis not present

## 2018-07-31 DIAGNOSIS — W010XXA Fall on same level from slipping, tripping and stumbling without subsequent striking against object, initial encounter: Secondary | ICD-10-CM | POA: Diagnosis present

## 2018-07-31 DIAGNOSIS — I110 Hypertensive heart disease with heart failure: Secondary | ICD-10-CM | POA: Diagnosis not present

## 2018-07-31 DIAGNOSIS — M7989 Other specified soft tissue disorders: Secondary | ICD-10-CM | POA: Diagnosis not present

## 2018-07-31 DIAGNOSIS — R0602 Shortness of breath: Secondary | ICD-10-CM | POA: Diagnosis not present

## 2018-07-31 DIAGNOSIS — M25562 Pain in left knee: Secondary | ICD-10-CM | POA: Diagnosis not present

## 2018-07-31 DIAGNOSIS — R062 Wheezing: Secondary | ICD-10-CM | POA: Diagnosis not present

## 2018-07-31 DIAGNOSIS — Z96652 Presence of left artificial knee joint: Secondary | ICD-10-CM | POA: Diagnosis not present

## 2018-07-31 DIAGNOSIS — L03116 Cellulitis of left lower limb: Secondary | ICD-10-CM | POA: Diagnosis not present

## 2018-07-31 DIAGNOSIS — Z79899 Other long term (current) drug therapy: Secondary | ICD-10-CM | POA: Diagnosis not present

## 2018-07-31 DIAGNOSIS — T380X5A Adverse effect of glucocorticoids and synthetic analogues, initial encounter: Secondary | ICD-10-CM | POA: Diagnosis not present

## 2018-07-31 DIAGNOSIS — Z8249 Family history of ischemic heart disease and other diseases of the circulatory system: Secondary | ICD-10-CM | POA: Diagnosis not present

## 2018-07-31 DIAGNOSIS — J4551 Severe persistent asthma with (acute) exacerbation: Secondary | ICD-10-CM | POA: Diagnosis not present

## 2018-07-31 DIAGNOSIS — R739 Hyperglycemia, unspecified: Secondary | ICD-10-CM | POA: Diagnosis not present

## 2018-07-31 DIAGNOSIS — E669 Obesity, unspecified: Secondary | ICD-10-CM | POA: Diagnosis not present

## 2018-07-31 DIAGNOSIS — J449 Chronic obstructive pulmonary disease, unspecified: Secondary | ICD-10-CM | POA: Diagnosis not present

## 2018-07-31 LAB — BASIC METABOLIC PANEL
ANION GAP: 10 (ref 5–15)
BUN: 14 mg/dL (ref 6–20)
CALCIUM: 8.2 mg/dL — AB (ref 8.9–10.3)
CHLORIDE: 100 mmol/L (ref 98–111)
CO2: 23 mmol/L (ref 22–32)
Creatinine, Ser: 1.05 mg/dL — ABNORMAL HIGH (ref 0.44–1.00)
GFR calc non Af Amer: 60 mL/min — ABNORMAL LOW (ref >60)
Glucose, Bld: 197 mg/dL — ABNORMAL HIGH (ref 70–99)
Potassium: 4.1 mmol/L (ref 3.5–5.1)
Sodium: 133 mmol/L — ABNORMAL LOW (ref 135–145)

## 2018-07-31 LAB — INFLUENZA PANEL BY PCR (TYPE A & B)
INFLAPCR: NEGATIVE
INFLBPCR: NEGATIVE

## 2018-07-31 LAB — PROCALCITONIN: Procalcitonin: 1.36 ng/mL

## 2018-07-31 LAB — RAPID URINE DRUG SCREEN, HOSP PERFORMED
Amphetamines: NOT DETECTED
Barbiturates: NOT DETECTED
Benzodiazepines: NOT DETECTED
COCAINE: NOT DETECTED
OPIATES: POSITIVE — AB
TETRAHYDROCANNABINOL: NOT DETECTED

## 2018-07-31 LAB — LACTIC ACID, PLASMA: LACTIC ACID, VENOUS: 2 mmol/L — AB (ref 0.5–1.9)

## 2018-07-31 LAB — CBC
HCT: 40.2 % (ref 36.0–46.0)
HEMOGLOBIN: 12.7 g/dL (ref 12.0–15.0)
MCH: 25.9 pg — AB (ref 26.0–34.0)
MCHC: 31.6 g/dL (ref 30.0–36.0)
MCV: 82 fL (ref 80.0–100.0)
Platelets: 167 10*3/uL (ref 150–400)
RBC: 4.9 MIL/uL (ref 3.87–5.11)
RDW: 18.6 % — ABNORMAL HIGH (ref 11.5–15.5)
WBC: 14 10*3/uL — ABNORMAL HIGH (ref 4.0–10.5)

## 2018-07-31 LAB — CK: CK TOTAL: 165 U/L (ref 38–234)

## 2018-07-31 LAB — HIV ANTIBODY (ROUTINE TESTING W REFLEX): HIV Screen 4th Generation wRfx: NONREACTIVE

## 2018-07-31 LAB — HEMOGLOBIN A1C
Hgb A1c MFr Bld: 6.1 % — ABNORMAL HIGH (ref 4.8–5.6)
Mean Plasma Glucose: 128.37 mg/dL

## 2018-07-31 MED ORDER — ONDANSETRON HCL 4 MG PO TABS: 4.0000 mg | ORAL_TABLET | Freq: Four times a day (QID) | ORAL | Status: DC | PRN

## 2018-07-31 MED ORDER — ONDANSETRON HCL 4 MG/2ML IJ SOLN
4.0000 mg | Freq: Four times a day (QID) | INTRAMUSCULAR | Status: DC | PRN
  Administered 2018-08-02: 4 mg via INTRAVENOUS
  Filled 2018-07-31: qty 2

## 2018-07-31 MED ORDER — ACETAMINOPHEN 325 MG PO TABS
650.0000 mg | ORAL_TABLET | Freq: Four times a day (QID) | ORAL | Status: DC | PRN
  Administered 2018-07-31: 650 mg via ORAL
  Filled 2018-07-31: qty 2

## 2018-07-31 MED ORDER — PANTOPRAZOLE SODIUM 40 MG PO TBEC: 40.0000 mg | DELAYED_RELEASE_TABLET | Freq: Every day | ORAL | Status: DC | PRN

## 2018-07-31 MED ORDER — LEVALBUTEROL HCL 0.63 MG/3ML IN NEBU
0.6300 mg | INHALATION_SOLUTION | Freq: Four times a day (QID) | RESPIRATORY_TRACT | Status: DC
  Administered 2018-07-31 (×2): 0.63 mg via RESPIRATORY_TRACT
  Filled 2018-07-31 (×2): qty 3

## 2018-07-31 MED ORDER — LEVALBUTEROL HCL 0.63 MG/3ML IN NEBU: 0.6300 mg | INHALATION_SOLUTION | Freq: Four times a day (QID) | RESPIRATORY_TRACT | Status: DC | PRN

## 2018-07-31 MED ORDER — LACTATED RINGERS IV BOLUS
500.0000 mL | Freq: Once | INTRAVENOUS | Status: AC
  Administered 2018-07-31: 500 mL via INTRAVENOUS

## 2018-07-31 MED ORDER — BUDESONIDE 0.25 MG/2ML IN SUSP
0.2500 mg | Freq: Two times a day (BID) | RESPIRATORY_TRACT | Status: DC
  Administered 2018-07-31 – 2018-08-02 (×5): 0.25 mg via RESPIRATORY_TRACT
  Filled 2018-07-31 (×5): qty 2

## 2018-07-31 MED ORDER — ENOXAPARIN SODIUM 40 MG/0.4ML ~~LOC~~ SOLN
40.0000 mg | SUBCUTANEOUS | Status: DC
  Administered 2018-07-31 – 2018-08-02 (×3): 40 mg via SUBCUTANEOUS
  Filled 2018-07-31 (×3): qty 0.4

## 2018-07-31 MED ORDER — TRAMADOL HCL 50 MG PO TABS
25.0000 mg | ORAL_TABLET | Freq: Four times a day (QID) | ORAL | Status: DC | PRN
  Administered 2018-07-31: 25 mg via ORAL
  Filled 2018-07-31: qty 1

## 2018-07-31 MED ORDER — INFLUENZA VAC SPLIT QUAD 0.5 ML IM SUSY
0.5000 mL | PREFILLED_SYRINGE | INTRAMUSCULAR | Status: AC
  Administered 2018-08-01: 0.5 mL via INTRAMUSCULAR
  Filled 2018-07-31: qty 0.5

## 2018-07-31 MED ORDER — METHYLPREDNISOLONE SODIUM SUCC 40 MG IJ SOLR
40.0000 mg | Freq: Two times a day (BID) | INTRAMUSCULAR | Status: DC
  Administered 2018-07-31 – 2018-08-02 (×5): 40 mg via INTRAVENOUS
  Filled 2018-07-31 (×6): qty 1

## 2018-07-31 MED ORDER — GUAIFENESIN ER 600 MG PO TB12: 1200.0000 mg | ORAL_TABLET | Freq: Two times a day (BID) | ORAL | Status: DC | PRN

## 2018-07-31 MED ORDER — HYDROCODONE-ACETAMINOPHEN 5-325 MG PO TABS
1.0000 | ORAL_TABLET | Freq: Four times a day (QID) | ORAL | Status: DC | PRN
  Administered 2018-07-31 – 2018-08-02 (×6): 1 via ORAL
  Filled 2018-07-31 (×6): qty 1

## 2018-07-31 MED ORDER — MONTELUKAST SODIUM 10 MG PO TABS
10.0000 mg | ORAL_TABLET | Freq: Every day | ORAL | Status: DC
  Administered 2018-07-31 – 2018-08-01 (×2): 10 mg via ORAL
  Filled 2018-07-31 (×2): qty 1

## 2018-07-31 MED ORDER — FLUTICASONE PROPIONATE 50 MCG/ACT NA SUSP
1.0000 | Freq: Every day | NASAL | Status: DC
  Administered 2018-07-31 – 2018-08-02 (×3): 1 via NASAL
  Filled 2018-07-31: qty 16

## 2018-07-31 MED ORDER — LEVALBUTEROL HCL 0.63 MG/3ML IN NEBU
0.6300 mg | INHALATION_SOLUTION | Freq: Three times a day (TID) | RESPIRATORY_TRACT | Status: DC
  Administered 2018-07-31 – 2018-08-02 (×5): 0.63 mg via RESPIRATORY_TRACT
  Filled 2018-07-31 (×8): qty 3

## 2018-07-31 MED ORDER — ACETAMINOPHEN 650 MG RE SUPP: 650.0000 mg | Freq: Four times a day (QID) | RECTAL | Status: DC | PRN

## 2018-07-31 NOTE — H&P (Signed)
History and Physical    Kathleen Rodriguez:[id number] DOB: 1966-05-26 DOA: 07/30/2018  PCP: Hoy Register, MD  Patient coming from: Home.  Chief Complaint: Fever chills nausea vomiting shortness of breath.  HPI: Kathleen Rodriguez is a 52 y.o. female with history of asthma, sleep apnea who has been recently treated with prednisone for asthma exacerbation started experiencing fever chills nausea vomiting and diarrhea over the last 4 days with like flulike symptoms.  Patient has been doing some cough productive cough and increasing wheezing.  2 days ago she also had a fall after tripping and fell onto her left knee following which patient started developing swelling in the left leg.  Due to persistent symptoms patient came to the ER.  Denies any chest pain loss of consciousness headache.  Nausea and vomiting and diarrhea is around 2-3 episodes every day denies any blood in the vomitus or diarrhea.  Has not had any abdominal pain.  ED Course: In the ER patient was febrile with temperature 101.8 tachycardic and had elevated lactate with elevated WBC count.  UA was consistent with possible UTI.  CT abdomen showed left groin lymphadenopathy probably inflammatory versus infectious.  On exam patient has tender left leg swollen.  The chest x-ray was unremarkable on exam patient is wheezing.  Patient was started on sepsis protocol fluid bolus Ringer lactate.  Following which lactate is improved.  Patient was started on empiric antibiotics.  Review of Systems: As per HPI, rest all negative.   Past Medical History:  Diagnosis Date  . Arthritis    "knees, lower back; legs, ankles" (01/27/2016)  . Asthma    followed by Dr. Craige Cotta  . CHF (congestive heart failure) (HCC) 2016   "when I went into a coma"  . Cocaine abuse (HCC)   . 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 Dyspnea   . GERD (gastroesophageal reflux disease)   . Hypertension    "doctor took me off RX in 2016" (01/27/2016)  . Influenza B April  2014   Complicated by multi-organ failure  . OSA on CPAP "since " 03/20/2013  . Pneumonia 2016  . Required emergent intubation    asthma exacerbation in 2016  . Tobacco abuse   . Upper airway cough syndrome     Past Surgical History:  Procedure Laterality Date  . BREAST SURGERY Right 1980   as a teenager , cyst was benign  . CESAREAN SECTION  2006  . LACERATION REPAIR Right ~ 1997   "tried to cut myself"  . 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 TUBAL LIGATION  2006     reports that she quit smoking about 5 years ago. Her smoking use included cigarettes. She has a 5.00 pack-year smoking history. She has never used smokeless tobacco. She reports that she does not drink alcohol or use drugs.  Allergies  Allergen Reactions  . Tomato Hives, Itching and Other (See Comments)    ALSO REACTS TO KETCHUP  . Latex Itching and Rash  . Wool Alcohol [Lanolin] Itching    Family History  Problem Relation Age of Onset  . Hypertension Mother   . HIV/AIDS Father     Prior to Admission medications   Medication Sig Start Date End Date Taking? Authorizing Provider  acetaminophen (TYLENOL) 500 MG tablet Take 1,000 mg by mouth 2 (two) times daily as needed  for headache.   Yes [provider]  albuterol (PROVENTIL HFA;VENTOLIN HFA) 108 (90 Base) MCG/ACT inhaler Inhale 2 puffs into the lungs every 6 (six) hours as needed for wheezing or shortness of breath. 04/13/18  Yes [name], MD  albuterol (PROVENTIL) (2.5 MG/3ML) 0.083% nebulizer solution Take 3 mLs (2.5 mg total) by nebulization every 6 (six) hours as needed for wheezing or shortness of breath. 02/19/18  Yes Sood, Laurier Nancy, MD  budesonide (PULMICORT) 0.25 MG/2ML nebulizer solution Take 2 mLs (0.25 mg total) by nebulization 2 (two) times daily. Patient taking differently: Take 0.25 mg by nebulization 3  (three) times daily.  02/19/18 02/19/19 Yes [name], MD  fluticasone (FLONASE) 50 MCG/ACT nasal spray Place 1 spray into both nostrils daily. Patient taking differently: Place 1 spray into both nostrils daily as needed for allergies.  02/19/18  Yes [name], MD  guaiFENesin (MUCINEX) 600 MG 12 hr tablet Take 1 tablet (600 mg total) by mouth 2 (two) times daily. Patient taking differently: Take 1,200 mg by mouth 2 (two) times daily as needed (congestion).  12/02/17  Yes Hongalgi, Maximino Greenland, MD  montelukast (SINGULAIR) 10 MG tablet Take 1 tablet (10 mg total) by mouth at bedtime. 06/06/18  Yes [name], MD  pantoprazole (PROTONIX) 40 MG tablet Take 40 mg by mouth daily as needed (acid reflux).   Yes [provider]  predniSONE (DELTASONE) 5 MG tablet 4 pills daily for 3 days, 3 pills daily for 3 days, 2 pills daily for 3 days, then 1 pill daily Patient not taking: Reported on 07/30/2018 07/10/18   [name], MD  Spacer/Aero-Holding Chambers (AEROCHAMBER MV) inhaler Use as instructed 07/11/18   [name], MD    Physical Exam: Vitals:   07/30/18 2345 07/31/18 0000 07/31/18 0015 07/31/18 0048  BP: 119/74 126/70 125/77 111/76  Pulse:      Resp: 20  (!) 22 20  Temp:    98.5 F (36.9 C)  TempSrc:    Oral  SpO2:    96%  Weight:    111.3 kg  Height:    5\' 6"  (1.676 m)      Constitutional: Moderately built and nourished. Vitals:   07/30/18 2345 07/31/18 0000 07/31/18 0015 07/31/18 0048  BP: 119/74 126/70 125/77 111/76  Pulse:      Resp: 20  (!) 22 20  Temp:    98.5 F (36.9 C)  TempSrc:    Oral  SpO2:    96%  Weight:    111.3 kg  Height:    5\' 6"  (1.676 m)   Eyes: Anicteric no pallor. ENMT: No discharge from the ears eyes nose or mouth. Neck: No mass felt.  No neck rigidity.  No JVD appreciated. Respiratory: Bilateral expiratory wheeze and no crepitations. Cardiovascular: S1-S2 heard no murmurs appreciated. Abdomen: Soft nontender bowel sounds  present. Musculoskeletal: Swollen left lower extremity with tenderness. Skin: Mild discolored left lower extremity. Neurologic: Alert awake oriented to time place and person.  Moves all extremities. Psychiatric: Appears normal per normal affect.   Labs on Admission: I have personally reviewed following labs and imaging studies  CBC: Recent Labs  Lab 07/30/18 1932  WBC 10.9*  NEUTROABS 7.7  HGB 13.8  HCT 44.1  MCV 83.1  PLT 182   Basic Metabolic Panel: Recent Labs  Lab 07/30/18 1932  NA 130*  K 5.2*  CL 95*  CO2 22  GLUCOSE 130*  BUN 12  CREATININE 1.53*  CALCIUM 8.4*   GFR: Estimated  Creatinine Clearance: 54.4 mL/min (A) (by C-G formula based on SCr of 1.53 mg/dL (H)). Liver Function Tests: Recent Labs  Lab 07/30/18 1932  AST 46*  ALT 27  ALKPHOS 48  BILITOT 1.7*  PROT 7.2  ALBUMIN 2.7*   No results for input(s): LIPASE, AMYLASE in the last 168 hours. No results for input(s): AMMONIA in the last 168 hours. Coagulation Profile: No results for input(s): INR, PROTIME in the last 168 hours. Cardiac Enzymes: No results for input(s): CKTOTAL, CKMB, CKMBINDEX, TROPONINI in the last 168 hours. BNP (last 3 results) No results for input(s): PROBNP in the last 8760 hours. HbA1C: No results for input(s): HGBA1C in the last 72 hours. CBG: No results for input(s): GLUCAP in the last 168 hours. Lipid Profile: No results for input(s): CHOL, HDL, LDLCALC, TRIG, CHOLHDL, LDLDIRECT in the last 72 hours. Thyroid Function Tests: No results for input(s): TSH, T4TOTAL, FREET4, T3FREE, THYROIDAB in the last 72 hours. Anemia Panel: No results for input(s): VITAMINB12, FOLATE, FERRITIN, TIBC, IRON, RETICCTPCT in the last 72 hours. Urine analysis:    Component Value Date/Time   COLORURINE AMBER (A) 07/30/2018 2239   APPEARANCEUR CLOUDY (A) 07/30/2018 2239   LABSPEC 1.028 07/30/2018 2239   PHURINE 5.0 07/30/2018 2239   GLUCOSEU NEGATIVE 07/30/2018 2239   HGBUR SMALL (A)  07/30/2018 2239   BILIRUBINUR NEGATIVE 07/30/2018 2239   BILIRUBINUR small 05/26/2017 1539   KETONESUR 5 (A) 07/30/2018 2239   PROTEINUR 100 (A) 07/30/2018 2239   UROBILINOGEN 1.0 05/26/2017 1539   UROBILINOGEN 1.0 03/22/2014 0805   NITRITE NEGATIVE 07/30/2018 2239   LEUKOCYTESUR NEGATIVE 07/30/2018 2239   Sepsis Labs: @LABRCNTIP (procalcitonin:4,lacticidven:4) )No results found for this or any previous visit (from the past 240 hour(s)).   Radiological Exams on Admission: Ct Abdomen Pelvis Wo Contrast  Result Date: 07/31/2018 CLINICAL DATA:  Nausea and diarrhea for 4 days. Dizziness when standing. EXAM: CT ABDOMEN AND PELVIS WITHOUT CONTRAST TECHNIQUE: Multidetector CT imaging of the abdomen and pelvis was performed following the standard protocol without IV contrast. COMPARISON:  None. FINDINGS: Lower chest: Lung bases are clear. Hepatobiliary: No focal liver abnormality is seen. No gallstones, gallbladder wall thickening, or biliary dilatation. Pancreas: Unremarkable. No pancreatic ductal dilatation or surrounding inflammatory changes. Spleen: Normal in size without focal abnormality. Adrenals/Urinary Tract: Adrenal glands are unremarkable. Kidneys are normal, without renal calculi, focal lesion, or hydronephrosis. Bladder is unremarkable. Stomach/Bowel: Stomach, small bowel, and colon are not abnormally distended. Scattered diverticula throughout the colon. No inflammatory changes. Appendix is normal. Vascular/Lymphatic: No significant vascular findings are present. No enlarged abdominal or pelvic lymph nodes. Reproductive: Uterus and bilateral adnexa are unremarkable. Other: No free air or free fluid in the abdomen. There is a moderately prominent lymph node in the left groin region with surrounding soft tissue infiltration likely representing inflammatory or infected node. Minimal fat containing left inguinal hernia. Musculoskeletal: Mild degenerative changes in the spine. No destructive bone  lesions. IMPRESSION: 1. Prominent lymph node in the left groin region with surrounding soft tissue infiltration likely representing inflammatory or infected node. 2. No evidence of bowel obstruction or inflammation. Diverticulosis of the colon without evidence of diverticulitis. Electronically Signed   By: Burman Nieves M.D.   On: 07/31/2018 00:27   Dg Chest 2 View  Result Date: 07/30/2018 CLINICAL DATA:  Cough and shortness of breath EXAM: CHEST - 2 VIEW COMPARISON:  Chest radiograph November 30, 2017 and chest CT Mar 12, 2018 FINDINGS: There is no edema or consolidation. Heart size and  pulmonary vascularity are normal. No adenopathy. No pneumothorax. No bone lesions. IMPRESSION: No edema or consolidation. Electronically Signed   By: Bretta Bang III M.D.   On: 07/30/2018 20:55    EKG: Independently reviewed.  Sinus tachycardia.  Assessment/Plan Principal Problem:   SIRS (systemic inflammatory response syndrome) (HCC) Active Problems:   OSA (obstructive sleep apnea)   Hypertension   Asthma exacerbation   ARF (acute renal failure) (HCC)   Nausea vomiting and diarrhea    1. SIRS -likely source could be left lower extremity cellulitis and UTI.  Follow cultures continue empiric antibiotics.  Since patient has significant tenderness in the left lower extremity I have ordered CT of the left lower extremity to rule out any deep abscess or hematoma.  Check Dopplers.  Follow lactate continue hydration and also check procalcitonin. 2. Asthma exacerbation on Solu-Medrol nebulizer and Pulmicort. 3. Sleep apnea on CPAP. 4. Acute renal failure likely from nausea vomiting and diarrhea.  Continue hydration follow metabolic panel. 5. Nausea vomiting and diarrhea -CT abdomen unremarkable.  Could be viral.  May check stool studies if there is further episode last episode of diarrhea was more than 24 hours ago. 6. Left groin lymphadenopathy could be from infectious or inflammatory.  Will need further  follow-up as outpatient after being treated with antibiotics now. 7. History of hypertension per the chart not on any antihypertensives. 8. Hyperglycemia likely from steroids. 9. History of CHF not on Lasix does not appear to be fluid overload at this time.  Last EF measured was 60 to 65%.   DVT prophylaxis: Lovenox. Code Status: Full code. Family Communication: Discussed with patient. Disposition Plan: Home. Consults called: None. Admission status: Observation.   Eduard Clos MD [facility] Pager [phone].  If 7PM-7AM, please contact night-coverage [email or web address] Password [email or web address]  07/31/2018, 3:30 AM

## 2018-07-31 NOTE — Care Management Note (Signed)
Case Management Note  Patient Details  Name: Kathleen Rodriguez MRN: [id number] Date of Birth: [birth year]  Subjective/Objective:  From home, has hx of L TKA, OSA, presents with fever and chills, sepsis due to cellulitis, possible uti, asthma ex, OSA, acute knee injury, n/v/d, left groin lymphadenopathy, hx of chf.                  Action/Plan: NCM will follow for transition of care needs.   Expected Discharge Date:  08/02/18               Expected Discharge Plan:     In-House Referral:     Discharge planning Services  CM Consult  Post Acute Care Choice:    Choice offered to:     DME Arranged:    DME Agency:     HH Arranged:    HH Agency:     Status of Service:  In process, will continue to follow  If discussed at Long Length of Stay Meetings, dates discussed:    Additional Comments:  Leone Haven, RN 07/31/2018, 4:09 PM

## 2018-07-31 NOTE — Progress Notes (Signed)
*  Preliminary Results* Bilateral lower extremity venous duplex completed. Bilateral lower extremities are negative for deep vein thrombosis. There is no evidence of Baker's cyst bilaterally.  07/31/2018 9:14 AM Gertie Fey, MHA, RVT, RDCS, RDMS

## 2018-07-31 NOTE — Progress Notes (Signed)
Patient seen and examined this morning, admitted by Dr. Kirtland Bouchard overnight, H&P reviewed and agree with the assessment and plan.  52 year old female with history of asthma, OSA, presents to the hospital with fever and chills over the last 3 to 4 days.  She also reports a fall few days ago and fell onto her left knee.  She has a history of left knee TKA about couple years ago.  Following the fall she is developed left lower leg swelling below the knee, became very erythematous and decided to come to the ER.   BP 124/81 (BP Location: Left Arm)   Pulse 85   Temp 97.7 F (36.5 C) (Oral)   Resp 18   Ht 5\' 6"  (1.676 m)   Wt 111.3 kg   SpO2 97%   BMI 39.60 kg/m   Sepsis due to cellulitis -Patient placed on vancomycin and ceftriaxone, continue.  Margins drawn this morning.  Area looks quite painful and erythematous, however fortunately is not circumferential.  CT scan without any deep abscesses.  Ultrasound without DVT  Possible UTI -She does report darker foul-smelling urine, ceftriaxone should cover, cultures pending  Asthma exacerbation -Continue steroids, nebulizers  OSA  -continue CPAP  Acute knee injury -Creatinine improving with fluids, continue to monitor  Nausea/vomiting/diarrhea -CT abdomen and pelvis unremarkable, this is resolved  Left groin lymphadenopathy -Infectious versus inflammatory  History of CHF, likely diastolic, this is chronic and she looks euvolemic   Scheduled Meds: . budesonide  0.25 mg Nebulization BID  . enoxaparin (LOVENOX) injection  40 mg Subcutaneous Q24H  . fluticasone  1 spray Each Nare Daily  . [START ON 08/01/2018] Influenza vac split quadrivalent PF  0.5 mL Intramuscular Tomorrow-1000  . levalbuterol  0.63 mg Nebulization Q6H  . methylPREDNISolone (SOLU-MEDROL) injection  40 mg Intravenous BID  . montelukast  10 mg Oral QHS   Continuous Infusions: . cefTRIAXone (ROCEPHIN)  IV Stopped (07/30/18 2042)  . vancomycin     PRN Meds:.acetaminophen  **OR** acetaminophen, guaiFENesin, levalbuterol, ondansetron **OR** ondansetron (ZOFRAN) IV, pantoprazole, traMADol

## 2018-07-31 NOTE — Plan of Care (Signed)
Patient oriented to the unit. Instructed about how to use call bell to call out for assistance. She was informed of her fall risk and instructed to call for assistance when ambulating.

## 2018-08-01 DIAGNOSIS — M25562 Pain in left knee: Secondary | ICD-10-CM

## 2018-08-01 LAB — URINE CULTURE: Culture: NO GROWTH

## 2018-08-01 MED ORDER — HYDROXYZINE HCL 25 MG PO TABS
25.0000 mg | ORAL_TABLET | Freq: Three times a day (TID) | ORAL | Status: DC | PRN
  Administered 2018-08-01 – 2018-08-02 (×2): 25 mg via ORAL
  Filled 2018-08-01 (×2): qty 1

## 2018-08-01 MED ORDER — VANCOMYCIN HCL IN DEXTROSE 750-5 MG/150ML-% IV SOLN
750.0000 mg | Freq: Two times a day (BID) | INTRAVENOUS | Status: DC
  Administered 2018-08-01 – 2018-08-02 (×2): 750 mg via INTRAVENOUS
  Filled 2018-08-01 (×2): qty 150

## 2018-08-01 NOTE — Consult Note (Signed)
ORTHOPAEDIC CONSULTATION  REQUESTING PHYSICIAN: Rolly Salter, MD  Chief Complaint: Left lower extremity cellulitis  HPI: Kathleen Rodriguez is a 52 y.o. female who presents with left lower leg cellulitis s/p fall onto LLE.  She was admitted for cellulitis and SIRS.  She feels pain of the knee since she fell directly onto it.    Past Medical History:  Diagnosis Date  . Arthritis    "knees, lower back; legs, ankles" (01/27/2016)  . Asthma    followed by Dr. Craige Cotta  . CHF (congestive heart failure) (HCC) 2016   "when I went into a coma"  . Cocaine abuse (HCC)   . 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 Dyspnea   . GERD (gastroesophageal reflux disease)   . Hypertension    "doctor took me off RX in 2016" (01/27/2016)  . Influenza B April 2014   Complicated by multi-organ failure  . OSA on CPAP "since " 03/20/2013  . Pneumonia 2016  . Required emergent intubation    asthma exacerbation in 2016  . Tobacco abuse   . Upper airway cough syndrome    Past Surgical History:  Procedure Laterality Date  . BREAST SURGERY Right 1980   as a teenager , cyst was benign  . CESAREAN SECTION  2006  . LACERATION REPAIR Right ~ 1997   "tried to cut myself"  . 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 TUBAL LIGATION  2006   Social History   Socioeconomic History  . Marital status: Married    Spouse name: Not on file  . Number of children: Not on file  . Years of education: Not on file  . Highest education level: Not on file  Occupational History  . Not on file  Social Needs  . Financial resource strain: Not on file  . Food insecurity:    Worry: Not on file    Inability: Not on file  . Transportation needs:    Medical: Not on file    Non-medical: Not on file  Tobacco Use  . Smoking status: Former Smoker    Packs/day: 0.25    Years: 20.00    Pack years:  5.00    Types: Cigarettes    Last attempt to quit: 01/31/2013    Years since quitting: 5.5  . Smokeless tobacco: Never Used  Substance and Sexual Activity  . 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 Drug use: No    Types: "Crack" cocaine    Comment: 01/27/2016 "clean from smoking crack since 2007"  . Sexual activity: Yes    Birth control/protection: None, Surgical  Lifestyle  . Physical activity:    Days per week: Not on file    Minutes per session: Not on file  . Stress: Not on file  Relationships  . Social connections:    Talks on phone: Not on file    Gets together: Not on file    Attends religious service: Not on file    Active member of club or organization: Not on file    Attends meetings of clubs or organizations: Not on file    Relationship status: Not on file  Other Topics Concern  . Not on file  Social History Narrative  . Not on file   Family History  Problem Relation Age of Onset  .  Hypertension Mother   . HIV/AIDS Father    - negative except otherwise stated in the family history section Allergies  Allergen Reactions  . Tomato Hives, Itching and Other (See Comments)    ALSO REACTS TO KETCHUP  . Latex Itching and Rash  . Wool Alcohol [Lanolin] Itching   Prior to Admission medications   Medication Sig Start Date End Date Taking? Authorizing Provider  acetaminophen (TYLENOL) 500 MG tablet Take 1,000 mg by mouth 2 (two) times daily as needed for headache.   Yes [provider]  albuterol (PROVENTIL HFA;VENTOLIN HFA) 108 (90 Base) MCG/ACT inhaler Inhale 2 puffs into the lungs every 6 (six) hours as needed for wheezing or shortness of breath. 04/13/18  Yes [name], MD  albuterol (PROVENTIL) (2.5 MG/3ML) 0.083% nebulizer solution Take 3 mLs (2.5 mg total) by nebulization every 6 (six) hours as needed for wheezing or shortness of breath. 02/19/18  Yes Sood, Laurier Nancy, MD  budesonide (PULMICORT) 0.25 MG/2ML nebulizer solution  Take 2 mLs (0.25 mg total) by nebulization 2 (two) times daily. Patient taking differently: Take 0.25 mg by nebulization 3 (three) times daily.  02/19/18 02/19/19 Yes [name], MD  fluticasone (FLONASE) 50 MCG/ACT nasal spray Place 1 spray into both nostrils daily. Patient taking differently: Place 1 spray into both nostrils daily as needed for allergies.  02/19/18  Yes [name], MD  guaiFENesin (MUCINEX) 600 MG 12 hr tablet Take 1 tablet (600 mg total) by mouth 2 (two) times daily. Patient taking differently: Take 1,200 mg by mouth 2 (two) times daily as needed (congestion).  12/02/17  Yes Hongalgi, Maximino Greenland, MD  montelukast (SINGULAIR) 10 MG tablet Take 1 tablet (10 mg total) by mouth at bedtime. 06/06/18  Yes [name], MD  pantoprazole (PROTONIX) 40 MG tablet Take 40 mg by mouth daily as needed (acid reflux).   Yes [provider]  predniSONE (DELTASONE) 5 MG tablet 4 pills daily for 3 days, 3 pills daily for 3 days, 2 pills daily for 3 days, then 1 pill daily Patient not taking: Reported on 07/30/2018 07/10/18   [name], MD  Spacer/Aero-Holding Chambers (AEROCHAMBER MV) inhaler Use as instructed 07/11/18   [name], MD   Ct Abdomen Pelvis Wo Contrast  Result Date: 07/31/2018 CLINICAL DATA:  Nausea and diarrhea for 4 days. Dizziness when standing. EXAM: CT ABDOMEN AND PELVIS WITHOUT CONTRAST TECHNIQUE: Multidetector CT imaging of the abdomen and pelvis was performed following the standard protocol without IV contrast. COMPARISON:  None. FINDINGS: Lower chest: Lung bases are clear. Hepatobiliary: No focal liver abnormality is seen. No gallstones, gallbladder wall thickening, or biliary dilatation. Pancreas: Unremarkable. No pancreatic ductal dilatation or surrounding inflammatory changes. Spleen: Normal in size without focal abnormality. Adrenals/Urinary Tract: Adrenal glands are unremarkable. Kidneys are normal, without renal calculi, focal lesion, or hydronephrosis. Bladder is  unremarkable. Stomach/Bowel: Stomach, small bowel, and colon are not abnormally distended. Scattered diverticula throughout the colon. No inflammatory changes. Appendix is normal. Vascular/Lymphatic: No significant vascular findings are present. No enlarged abdominal or pelvic lymph nodes. Reproductive: Uterus and bilateral adnexa are unremarkable. Other: No free air or free fluid in the abdomen. There is a moderately prominent lymph node in the left groin region with surrounding soft tissue infiltration likely representing inflammatory or infected node. Minimal fat containing left inguinal hernia. Musculoskeletal: Mild degenerative changes in the spine. No destructive bone lesions. IMPRESSION: 1. Prominent lymph node in the left groin region with surrounding soft tissue infiltration likely representing inflammatory or infected  node. 2. No evidence of bowel obstruction or inflammation. Diverticulosis of the colon without evidence of diverticulitis. Electronically Signed   By: Burman Nieves M.D.   On: 07/31/2018 00:27   Dg Chest 2 View  Result Date: 07/30/2018 CLINICAL DATA:  Cough and shortness of breath EXAM: CHEST - 2 VIEW COMPARISON:  Chest radiograph November 30, 2017 and chest CT Mar 12, 2018 FINDINGS: There is no edema or consolidation. Heart size and pulmonary vascularity are normal. No adenopathy. No pneumothorax. No bone lesions. IMPRESSION: No edema or consolidation. Electronically Signed   By: Bretta Bang III M.D.   On: 07/30/2018 20:55   Ct Tibia Fibula Left Wo Contrast  Result Date: 07/31/2018 CLINICAL DATA:  Rash on the left tibia with redness, swelling, and fever. Osteomyelitis is suspected. EXAM: CT OF THE LOWER LEFT EXTREMITY WITHOUT CONTRAST TECHNIQUE: Multidetector CT imaging of the lower left extremity was performed according to the standard protocol. COMPARISON:  Left knee radiographs 11/12/2016 FINDINGS: Bones/Joint/Cartilage Left total knee arthroplasty. There is mild lucency  at the bone-cement interface of the tibial compartment in the medial tibial plateau. This could indicate loosening. Otherwise, components appear well seated. Visualization is somewhat limited in some areas due to streak artifact from the hardware itself. No acute fracture or dislocation. No focal bone lesion or bone destruction. Ligaments Suboptimally assessed by CT. Muscles and Tendons Appear intact. Soft tissues Mild diffuse infiltration throughout the subcutaneous fat likely due to edema although cellulitis could also have this appearance. More prominent focal infiltration in the subcutaneous fat anteriorly inferior to the patella. No discrete loculated collection is identified. No soft tissue gas. IMPRESSION: 1. Left total knee arthroplasty. There is mild lucency at the bone-cement interface of the tibial compartment in the medial tibial plateau. This could indicate loosening. Otherwise, components appear well seated. No acute fracture or dislocation is identified. 2. Mild diffuse infiltration throughout the subcutaneous fat likely due to edema although cellulitis could also have this appearance. More prominent focal infiltration in the subcutaneous fat anteriorly inferior to the patella. No discrete abscess is identified by CT. Electronically Signed   By: Burman Nieves M.D.   On: 07/31/2018 05:40   - pertinent xrays, CT, MRI studies were reviewed and independently interpreted  Positive ROS: All other systems have been reviewed and were otherwise negative with the exception of those mentioned in the HPI and as above.  Physical Exam: General: Alert, no acute distress Cardiovascular: No pedal edema Respiratory: No cyanosis, no use of accessory musculature GI: No organomegaly, abdomen is soft and non-tender Skin: No lesions in the area of chief complaint Neurologic: Sensation intact distally Psychiatric: Patient is competent for consent with normal mood and affect Lymphatic: No axillary or cervical  lymphadenopathy  MUSCULOSKELETAL:  - fully healed surgical scar - painless ROM of knee - patella tracking normal - no effusion  Assessment: Left lower leg cellulitis  Plan: - reviewed xray and CT scan and are not representative of loosening - IV abx for cellulitis - recommend postponing upcoming right TKA until cellulitis is fully treated - patient in agreement.  Thank you for the consult and the opportunity to see Ms. Donata Reddick. Glee Arvin, MD [facility] [phone] 7:31 PM

## 2018-08-01 NOTE — Progress Notes (Signed)
[facility] Progress Note  Patient: Kathleen Rodriguez CVE:[id number]   PCP: [name], MD DOB: 07-26-1966   DOA: 07/30/2018   DOS: 08/01/2018   Date of Service: the patient was seen and examined on 08/01/2018  Brief hospital course: Pt. with PMH of OSA, asthma; admitted on 07/30/2018, presented with complaint of fever and chills and leg swelling, was found to have cellulitis causing sepsis. Currently further plan is continue IV antibiotics.  Subjective: Feeling better.  Patient continues to have pain as well as her left leg.  Assessment and Plan: 1.  Sepsis due to cellulitis. Continue IV vancomycin and ceftriaxone. Cultures are currently pending. Significantly edematous as well as erythematous. Check ESR CRP and CK tomorrow morning. CT scan of the leg is negative for any abscess or fasciitis.  2.  Left knee injury after fall. Left knee total arthroplasty. Orthopedic consulted as a CT scan is concerning for possible loosening of the hardware.  3.  Asthma exacerbation. Continue steroids and nebulizer.  4.  Concern for UTI-UA ruled out.  5.  OSA. Continue CPAP.  6.  Nausea vomiting and diarrhea. CT abdomen pelvis is unremarkable. Currently resolved. Eating okay.  7.  Left groin lymphadenopathy. Most likely infectious in nature with cellulitis of the left leg.  8.  CHF. Diastolic chronic. No exacerbation or volume overload for now. Monitor clinically Diet: Cardiac diet DVT Prophylaxis: subcutaneous Heparin  Advance goals of care discussion: Full code  Family Communication: no family was present at bedside, at the time of interview.   Disposition:  Discharge to be determined.  Consultants: none Procedures: none  Scheduled Meds: . budesonide  0.25 mg Nebulization BID  . enoxaparin (LOVENOX) injection  40 mg Subcutaneous Q24H  . fluticasone  1 spray Each Nare Daily  . levalbuterol  0.63 mg Nebulization TID  . methylPREDNISolone (SOLU-MEDROL) injection  40  mg Intravenous BID  . montelukast  10 mg Oral QHS   Continuous Infusions: . cefTRIAXone (ROCEPHIN)  IV Stopped (08/01/18 0730)  . vancomycin 750 mg (08/01/18 1528)   PRN Meds: acetaminophen **OR** acetaminophen, guaiFENesin, HYDROcodone-acetaminophen, hydrOXYzine, levalbuterol, ondansetron **OR** ondansetron (ZOFRAN) IV, pantoprazole Antibiotics: Anti-infectives (From admission, onward)   Start     Dose/Rate Route Frequency Ordered Stop   08/01/18 1600  vancomycin (VANCOCIN) IVPB 750 mg/150 ml premix     750 mg 150 mL/hr over 60 Minutes Intravenous Every 12 hours 08/01/18 1317     07/31/18 1800  vancomycin (VANCOCIN) 1,250 mg in sodium chloride 0.9 % 250 mL IVPB  Status:  Discontinued     1,250 mg 166.7 mL/hr over 90 Minutes Intravenous Every 24 hours 07/30/18 2201 08/01/18 1317   07/31/18 0000  metroNIDAZOLE (FLAGYL) IVPB 500 mg     500 mg 100 mL/hr over 60 Minutes Intravenous  Once 07/30/18 2346 07/31/18 0128   07/30/18 2015  vancomycin (VANCOCIN) IVPB 1000 mg/200 mL premix     1,000 mg 200 mL/hr over 60 Minutes Intravenous  Once 07/30/18 2003 07/30/18 2214   07/30/18 2015  cefTRIAXone (ROCEPHIN) 2 g in sodium chloride 0.9 % 100 mL IVPB     2 g 200 mL/hr over 30 Minutes Intravenous Every 24 hours 07/30/18 2003     07/30/18 1945  cefTRIAXone (ROCEPHIN) 1 g in sodium chloride 0.9 % 100 mL IVPB  Status:  Discontinued     1 g 200 mL/hr over 30 Minutes Intravenous Every 24 hours 07/30/18 1939 07/30/18 2003       Objective: Physical Exam: Vitals:  08/01/18 0757 08/01/18 0816 08/01/18 1433 08/01/18 1634  BP:  132/85  101/85  Pulse:  89  85  Resp:      Temp:  97.7 F (36.5 C)  (!) 97.4 F (36.3 C)  TempSrc:  Oral  Oral  SpO2: 93% 92% 98% 94%  Weight:      Height:       No intake or output data in the 24 hours ending 08/01/18 1728 Filed Weights   07/30/18 1912 07/31/18 0048 07/31/18 0500  Weight: 113 kg 111.3 kg 111.3 kg   General: Alert, Awake and Oriented to Time,  Place and Person. Appear in mild distress, affect appropriate Eyes: PERRL, Conjunctiva normal ENT: Oral Mucosa clear moist. Neck: no JVD, no Abnormal Mass Or lumps Cardiovascular: S1 and S2 Present, no Murmur, Peripheral Pulses Present Respiratory: normal respiratory effort, Bilateral Air entry equal and Decreased, no use of accessory muscle, Clear to Auscultation, no Crackles, no wheezes Abdomen: Bowel Sound present, Soft and no tenderness, no hernia Skin: left leg and knee redness, left knee Rash, left leg induration Extremities: bilateral  Pedal edema, no calf tenderness Neurologic: Grossly no focal neuro deficit. Bilaterally Equal motor strength  Data Reviewed: CBC: Recent Labs  Lab 07/30/18 1932 07/31/18 0341  WBC 10.9* 14.0*  NEUTROABS 7.7  --   HGB 13.8 12.7  HCT 44.1 40.2  MCV 83.1 82.0  PLT 182 915   Basic Metabolic Panel: Recent Labs  Lab 07/30/18 1932 07/31/18 0341  NA 130* 133*  K 5.2* 4.1  CL 95* 100  CO2 22 23  GLUCOSE 130* 197*  BUN 12 14  CREATININE 1.53* 1.05*  CALCIUM 8.4* 8.2*    Liver Function Tests: Recent Labs  Lab 07/30/18 1932  AST 46*  ALT 27  ALKPHOS 48  BILITOT 1.7*  PROT 7.2  ALBUMIN 2.7*   No results for input(s): LIPASE, AMYLASE in the last 168 hours. No results for input(s): AMMONIA in the last 168 hours. Coagulation Profile: No results for input(s): INR, PROTIME in the last 168 hours. Cardiac Enzymes: Recent Labs  Lab 07/31/18 0341  CKTOTAL 165   BNP (last 3 results) No results for input(s): PROBNP in the last 8760 hours. CBG: No results for input(s): GLUCAP in the last 168 hours. Studies: No results found.   Time spent: 35 minutes  Author: Berle Mull, MD [facility] Pager: [phone] 08/01/2018 5:28 PM  If 7PM-7AM, please contact night-coverage at [email or web address], password [facility]

## 2018-08-01 NOTE — Progress Notes (Addendum)
Pharmacy Antibiotic Note  Kathleen Rodriguez is a 52 y.o. female admitted on 07/30/2018 with cellulitis.  Pharmacy was consulted on 10/7 for vancomycin dosing. Ceftriaxone 2g q24h per MD.   WBC - 14<10.9, lactic acid - 2.0<1.32  on 10/8.  Afebrile, Tmax - 97.8  SCr has improved, decreased to 1.05 < 1.53,  CrCl ~ 79 ml/min. I will adjust vancomycin dose for improved renal function. Goal Vancomycin trough =10-15 mcg/ml   Plan: -Vancomycin adjust dose to 750mg  IV q12h  -Ceftriaxone 2g q24h per MD  Height: 5\' 6"  (167.6 cm) Weight: 245 lb 6 oz (111.3 kg) IBW/kg (Calculated) : 59.3  Temp (24hrs), Avg:97.8 F (36.6 C), Min:97.7 F (36.5 C), Max:97.8 F (36.6 C)  Recent Labs  Lab 07/30/18 1932 07/30/18 1947 07/30/18 2148 07/31/18 0341  WBC 10.9*  --   --  14.0*  CREATININE 1.53*  --   --  1.05*  LATICACIDVEN  --  1.94* 1.32 2.0*    Estimated Creatinine Clearance: 79.3 mL/min (A) (by C-G formula based on SCr of 1.05 mg/dL (H)).    Allergies  Allergen Reactions  . Tomato Hives, Itching and Other (See Comments)    ALSO REACTS TO KETCHUP  . Latex Itching and Rash  . Wool Alcohol [Lanolin] Itching    Antimicrobials this admission: vancomycin 10/7 >>  ceftriaxone 10/7 >>   Dose adjustments this admission: 10/9 adjusted vanc from 1250 mg IV 24h to 750mg  IV q12h  (SCr improved)   Microbiology results: 10/7 UCx : neg/F 10/7 BCx x2: ngtd   Thank you for allowing pharmacy to be a part of this patient's care. Noah Delaine, RPh Clinical Pharmacist Please check AMION for all [facility] Pharmacy phone numbers After 10:00 PM, call Main Pharmacy [phone] 08/01/2018 1:09 PM

## 2018-08-02 LAB — COMPREHENSIVE METABOLIC PANEL
ALBUMIN: 2.3 g/dL — AB (ref 3.5–5.0)
ALT: 49 U/L — ABNORMAL HIGH (ref 0–44)
ANION GAP: 8 (ref 5–15)
AST: 62 U/L — ABNORMAL HIGH (ref 15–41)
Alkaline Phosphatase: 48 U/L (ref 38–126)
BUN: 18 mg/dL (ref 6–20)
CO2: 28 mmol/L (ref 22–32)
Calcium: 8.4 mg/dL — ABNORMAL LOW (ref 8.9–10.3)
Chloride: 103 mmol/L (ref 98–111)
Creatinine, Ser: 0.79 mg/dL (ref 0.44–1.00)
GFR calc non Af Amer: >60 mL/min (ref >60)
Glucose, Bld: 125 mg/dL — ABNORMAL HIGH (ref 70–99)
Potassium: 4.6 mmol/L (ref 3.5–5.1)
SODIUM: 139 mmol/L (ref 135–145)
Total Bilirubin: 0.1 mg/dL — ABNORMAL LOW (ref 0.3–1.2)
Total Protein: 6.1 g/dL — ABNORMAL LOW (ref 6.5–8.1)

## 2018-08-02 LAB — CBC WITH DIFFERENTIAL/PLATELET
ABS IMMATURE GRANULOCYTES: 0.27 10*3/uL — AB (ref 0.00–0.07)
BASOS ABS: 0 10*3/uL (ref 0.0–0.1)
Basophils Relative: 0 %
EOS PCT: 0 %
Eosinophils Absolute: 0 10*3/uL (ref 0.0–0.5)
HCT: 35.6 % — ABNORMAL LOW (ref 36.0–46.0)
Hemoglobin: 11.1 g/dL — ABNORMAL LOW (ref 12.0–15.0)
Immature Granulocytes: 2 %
Lymphocytes Relative: 6 %
Lymphs Abs: 0.9 10*3/uL (ref 0.7–4.0)
MCH: 25.6 pg — AB (ref 26.0–34.0)
MCHC: 31.2 g/dL (ref 30.0–36.0)
MCV: 82.2 fL (ref 80.0–100.0)
MONO ABS: 0.7 10*3/uL (ref 0.1–1.0)
Monocytes Relative: 5 %
NEUTROS ABS: 12.6 10*3/uL — AB (ref 1.7–7.7)
NRBC: 0 % (ref 0.0–0.2)
Neutrophils Relative %: 87 %
PLATELETS: 245 10*3/uL (ref 150–400)
RBC: 4.33 MIL/uL (ref 3.87–5.11)
RDW: 18.9 % — ABNORMAL HIGH (ref 11.5–15.5)
WBC: 14.5 10*3/uL — AB (ref 4.0–10.5)

## 2018-08-02 LAB — MRSA PCR SCREENING: MRSA BY PCR: NEGATIVE

## 2018-08-02 LAB — C-REACTIVE PROTEIN: CRP: 12.1 mg/dL — ABNORMAL HIGH (ref <1.0)

## 2018-08-02 LAB — SEDIMENTATION RATE: SED RATE: 104 mm/h — AB (ref 0–22)

## 2018-08-02 LAB — MAGNESIUM: Magnesium: 1.9 mg/dL (ref 1.7–2.4)

## 2018-08-02 MED ORDER — METHOCARBAMOL 500 MG PO TABS: 500.0000 mg | ORAL_TABLET | Freq: Three times a day (TID) | ORAL | 0 refills | Status: DC | PRN

## 2018-08-02 MED ORDER — DOXYCYCLINE HYCLATE 100 MG PO TABS: 100.0000 mg | ORAL_TABLET | Freq: Two times a day (BID) | ORAL | 0 refills | Status: DC

## 2018-08-02 MED ORDER — HYDROCODONE-ACETAMINOPHEN 5-325 MG PO TABS: 1.0000 | ORAL_TABLET | Freq: Three times a day (TID) | ORAL | 0 refills | Status: DC | PRN

## 2018-08-02 MED ORDER — HYDROXYZINE HCL 25 MG PO TABS: 25.0000 mg | ORAL_TABLET | Freq: Three times a day (TID) | ORAL | 0 refills | Status: DC | PRN

## 2018-08-02 MED ORDER — PREDNISONE 10 MG PO TABS: ORAL_TABLET | ORAL | 0 refills | Status: DC

## 2018-08-02 MED ORDER — CEFUROXIME AXETIL 500 MG PO TABS: 500.0000 mg | ORAL_TABLET | Freq: Two times a day (BID) | ORAL | 0 refills | Status: DC

## 2018-08-02 MED ORDER — DOXYCYCLINE HYCLATE 100 MG PO TABS
100.0000 mg | ORAL_TABLET | Freq: Two times a day (BID) | ORAL | Status: DC
  Administered 2018-08-02: 100 mg via ORAL
  Filled 2018-08-02: qty 1

## 2018-08-02 NOTE — Evaluation (Signed)
Physical Therapy Evaluation and D/C Patient Details Name: Kathleen Rodriguez MRN: [id number] DOB: August 16, 1966 Today's Date: 08/02/2018   History of Present Illness  Kathleen Rodriguez is a 52 y.o. female who presents with left lower leg cellulitis s/p fall onto LLE.  She was admitted for cellulitis and SIRS.  Ortho cleared pt for mobility.  Pt with left TKA 2 years ago and plan for right TKA was for next week but will be rescheduled.  PMH: asthma, OSA  Clinical Impression  Pt admitted with above diagnosis. Pt currently without significant functional limitations and can ambulate with RW without assistance.  Pt has pain left LE but it doesn't limit pts' mobility.  Do not feet that PT f/u is indicated.  Will sign off as pt is Modif I with RW and functioning as she did PTA.  Thanks.   Follow Up Recommendations No PT follow up    Equipment Recommendations  Other (comment)(wants rollator but unsure if insurance will cover)    Recommendations for Other Services       Precautions / Restrictions Precautions Precautions: Fall Restrictions Weight Bearing Restrictions: No      Mobility  Bed Mobility Overal bed mobility: Independent                Transfers Overall transfer level: Independent                  Ambulation/Gait Ambulation/Gait assistance: Modified independent (Device/Increase time) Gait Distance (Feet): 500 Feet Assistive device: Rolling walker (2 wheeled) Gait Pattern/deviations: Step-through pattern;Decreased stride length   Gait velocity interpretation: 1.31 - 2.62 ft/sec, indicative of limited community ambulator General Gait Details: Pt can accept challenges to balance and able to ambulate without assist and without cues.  Safe with RW  Stairs            Wheelchair Mobility    Modified Rankin (Stroke Patients Only)       Balance Overall balance assessment: Needs assistance Sitting-balance support: No upper extremity supported;Feet supported Sitting  balance-Leahy Scale: Good     Standing balance support: No upper extremity supported;During functional activity Standing balance-Leahy Scale: Fair Standing balance comment: can stand statically without UE support                             Pertinent Vitals/Pain Pain Assessment: Faces Faces Pain Scale: Hurts little more Pain Location: left LE Pain Descriptors / Indicators: Grimacing;Guarding;Sore Pain Intervention(s): Limited activity within patient's tolerance;Monitored during session;Repositioned    Home Living Family/patient expects to be discharged to:: Private residence Living Arrangements: Spouse/significant other;Children Available Help at Discharge: Family;Available PRN/intermittently Type of Home: House Home Access: Stairs to enter Entrance Stairs-Rails: Right Entrance Stairs-Number of Steps: 3 Home Layout: One level Home Equipment: Walker - 2 wheels;Cane - single point;Tub bench;Bedside commode;Hospital bed;Adaptive equipment      Prior Function Level of Independence: Independent with assistive device(s)         Comments: Pt uses cane and RW prn per pt.  When she has a bad day, she will use devices.       Hand Dominance   Dominant Hand: Right    Extremity/Trunk Assessment   Upper Extremity Assessment Upper Extremity Assessment: Defer to OT evaluation    Lower Extremity Assessment Lower Extremity Assessment: Generalized weakness    Cervical / Trunk Assessment Cervical / Trunk Assessment: Normal  Communication   Communication: No difficulties  Cognition Arousal/Alertness: Awake/alert Behavior During Therapy: [facility] for  tasks assessed/performed Overall Cognitive Status: Within Functional Limits for tasks assessed                                        General Comments      Exercises     Assessment/Plan    PT Assessment Patent does not need any further PT services  PT Problem List         PT Treatment  Interventions      PT Goals (Current goals can be found in the Care Plan section)  Acute Rehab PT Goals Patient Stated Goal: to go home and recover PT Goal Formulation: All assessment and education complete, DC therapy    Frequency     Barriers to discharge        Co-evaluation               AM-PAC PT "6 Clicks" Daily Activity  Outcome Measure Difficulty turning over in bed (including adjusting bedclothes, sheets and blankets)?: None Difficulty moving from lying on back to sitting on the side of the bed? : None Difficulty sitting down on and standing up from a chair with arms (e.g., wheelchair, bedside commode, etc,.)?: None Help needed moving to and from a bed to chair (including a wheelchair)?: None Help needed walking in hospital room?: None Help needed climbing 3-5 steps with a railing? : None 6 Click Score: 24    End of Session Equipment Utilized During Treatment: Gait belt Activity Tolerance: Patient limited by fatigue Patient left: with call bell/phone within reach;in bed Nurse Communication: Mobility status PT Visit Diagnosis: Muscle weakness (generalized) (M62.81)    Time: 1610-9604 PT Time Calculation (min) (ACUTE ONLY): 19 min   Charges:   PT Evaluation $PT Eval Low Complexity: 1 Low          Kathleen Rodriguez,PT Acute Rehabilitation Services Pager:  [phone]  Office:  [phone]    Kathleen Rodriguez 08/02/2018, 9:36 AM

## 2018-08-04 LAB — CULTURE, BLOOD (ROUTINE X 2)
CULTURE: NO GROWTH
CULTURE: NO GROWTH
Special Requests: ADEQUATE
Special Requests: ADEQUATE

## 2018-08-06 NOTE — Discharge Summary (Signed)
[facility] Discharge Summary   Patient: Kathleen Rodriguez OQH:[id number]   PCP: [name], MD DOB: Jan 29, 1966   Date of admission: 07/30/2018   Date of discharge: 08/02/2018   Discharge Diagnoses:  Principal Problem:   SIRS (systemic inflammatory response syndrome) (HCC) Active Problems:   OSA (obstructive sleep apnea)   Hypertension   Asthma exacerbation   ARF (acute renal failure) (HCC)   Nausea vomiting and diarrhea   Admitted From: home Disposition:  Home   Recommendations for Outpatient Follow-up:  1. Please follow up with PCP in 1 week   [place] Follow up.   Why:  call on Monday 10/14 to schedule hospital follow up Contact information: [address] [place] [id number] [phone]         Diet recommendation: cardiac diet  Activity: The patient is advised to gradually reintroduce usual activities.  Discharge Condition: good  Code Status: full code  History of present illness: As per the H and P dictated on admission, "Kathleen Rodriguez is a 52 y.o. female with history of asthma, sleep apnea who has been recently treated with prednisone for asthma exacerbation started experiencing fever chills nausea vomiting and diarrhea over the last 4 days with like flulike symptoms.  Patient has been doing some cough productive cough and increasing wheezing.  2 days ago she also had a fall after tripping and fell onto her left knee following which patient started developing swelling in the left leg.  Due to persistent symptoms patient came to the ER.  Denies any chest pain loss of consciousness headache.  Nausea and vomiting and diarrhea is around 2-3 episodes every day denies any blood in the vomitus or diarrhea.  Has not had any abdominal pain.  ED Course: In the ER patient was febrile with temperature 101.8 tachycardic and had elevated lactate with elevated WBC count.  UA was consistent  with possible UTI.  CT abdomen showed left groin lymphadenopathy probably inflammatory versus infectious.  On exam patient has tender left leg swollen.  The chest x-ray was unremarkable on exam patient is wheezing.  Patient was started on sepsis protocol fluid bolus Ringer lactate.  Following which lactate is improved.  Patient was started on empiric antibiotics."  Hospital Course:  Summary of her active problems in the hospital is as following. 1.  Sepsis due to cellulitis. Treated with IV vancomycin and ceftriaxone. Switch to oral Antibiotics.  Cultures are currently pending. Significantly edematous as well as erythematous. Elevated ESR CRP and normal CK CT scan of the leg is negative for any abscess or fasciitis.  2.  Left knee injury after fall. Left knee total arthroplasty. Orthopedic consulted as a CT scan is concerning for possible loosening of the hardware. Appreciated their assistance, thy feel that the CT findings are not consistent with loosening hardware.   3.  Asthma exacerbation. Continue steroids and nebulizer.  4.  Concern for UTI-UA ruled out.  5.  OSA. Continue CPAP.  6.  Nausea vomiting and diarrhea. CT abdomen pelvis is unremarkable. Currently resolved. Eating okay.  7.  Left groin lymphadenopathy. Most likely infectious in nature with cellulitis of the left leg.  8.  CHF. Diastolic chronic. No exacerbation or volume overload for now.  All other chronic medical condition were stable during the hospitalization.  Patient was seen by physical therapy, who recommended no PT follow up needed  On the day of the discharge the patient's vitals were  stable , and no other acute medical condition were reported by patient. the patient was felt safe to be discharge at home with family.  Consultants: wound care Procedures: none  DISCHARGE MEDICATION: Allergies as of 08/02/2018      Reactions   Tomato Hives, Itching, Other (See Comments)   ALSO REACTS TO  KETCHUP   Latex Itching, Rash   Wool Alcohol [lanolin] Itching      Medication List    STOP taking these medications   acetaminophen 500 MG tablet Commonly known as:  TYLENOL     TAKE these medications   AEROCHAMBER MV inhaler Use as instructed   albuterol (2.5 MG/3ML) 0.083% nebulizer solution Commonly known as:  PROVENTIL Take 3 mLs (2.5 mg total) by nebulization every 6 (six) hours as needed for wheezing or shortness of breath.   albuterol 108 (90 Base) MCG/ACT inhaler Commonly known as:  PROVENTIL HFA;VENTOLIN HFA Inhale 2 puffs into the lungs every 6 (six) hours as needed for wheezing or shortness of breath.   budesonide 0.25 MG/2ML nebulizer solution Commonly known as:  PULMICORT Take 2 mLs (0.25 mg total) by nebulization 2 (two) times daily. What changed:  when to take this   cefUROXime 500 MG tablet Commonly known as:  CEFTIN Take 1 tablet (500 mg total) by mouth 2 (two) times daily for 10 days.   doxycycline 100 MG tablet Commonly known as:  VIBRA-TABS Take 1 tablet (100 mg total) by mouth 2 (two) times daily for 7 days.   fluticasone 50 MCG/ACT nasal spray Commonly known as:  FLONASE Place 1 spray into both nostrils daily. What changed:    when to take this  reasons to take this   guaiFENesin 600 MG 12 hr tablet Commonly known as:  MUCINEX Take 1 tablet (600 mg total) by mouth 2 (two) times daily. What changed:    how much to take  when to take this  reasons to take this   HYDROcodone-acetaminophen 5-325 MG tablet Commonly known as:  NORCO/VICODIN Take 1 tablet by mouth 3 (three) times daily as needed for up to 5 days for severe pain.   hydrOXYzine 25 MG tablet Commonly known as:  ATARAX/VISTARIL Take 1 tablet (25 mg total) by mouth 3 (three) times daily as needed for itching.   methocarbamol 500 MG tablet Commonly known as:  ROBAXIN Take 1 tablet (500 mg total) by mouth every 8 (eight) hours as needed for muscle spasms.   montelukast 10  MG tablet Commonly known as:  SINGULAIR Take 1 tablet (10 mg total) by mouth at bedtime.   pantoprazole 40 MG tablet Commonly known as:  PROTONIX Take 40 mg by mouth daily as needed (acid reflux).   predniSONE 10 MG tablet Commonly known as:  DELTASONE Take 33m daily for 3days,Take 319mdaily for 3days,Take 2064maily for 3days,Take 70m80mily for 3days, then stop. What changed:    medication strength  additional instructions      Allergies  Allergen Reactions  . Tomato Hives, Itching and Other (See Comments)    ALSO REACTS TO KETCHUP  . Latex Itching and Rash  . Wool Alcohol [Lanolin] Itching   Discharge Instructions    Diet - low sodium heart healthy   Complete by:  As directed    Discharge instructions   Complete by:  As directed    It is important that you read following instructions as well as go over your medication list with RN to help you understand your care after  this hospitalization.  Discharge Instructions: Please follow-up with PCP in one week  Please request your primary care physician to go over all Hospital Tests and Procedure/Radiological results at the follow up,  Please get all Hospital records sent to your PCP by signing hospital release before you go home.   Do not take more than prescribed Pain, Sleep and Anxiety Medications. You were cared for by a hospitalist during your hospital stay. If you have any questions about your discharge medications or the care you received while you were in the hospital after you are discharged, you can call the unit and ask to speak with the hospitalist on call if the hospitalist that took care of you is not available.  Once you are discharged, your primary care physician will handle any further medical issues. Please note that NO REFILLS for any discharge medications will be authorized once you are discharged, as it is imperative that you return to your primary care physician (or establish a relationship with a primary  care physician if you do not have one) for your aftercare needs so that they can reassess your need for medications and monitor your lab values. You Must read complete instructions/literature along with all the possible adverse reactions/side effects for all the Medicines you take and that have been prescribed to you. Take any new Medicines after you have completely understood and accept all the possible adverse reactions/side effects. Wear Seat belts while driving. If you have smoked or chewed Tobacco in the last 2 yrs please stop smoking and/or stop any Recreational drug use.   Increase activity slowly   Complete by:  As directed      Discharge Exam: Filed Weights   07/31/18 0048 07/31/18 0500 08/02/18 0500  Weight: 111.3 kg 111.3 kg 111.3 kg   Vitals:   08/02/18 0819 08/02/18 0820  BP:  (!) 142/86  Pulse:  79  Resp:  18  Temp:  97.7 F (36.5 C)  SpO2: 98% 98%   General: Appear in no distress, no Rash; Oral Mucosa moist. Cardiovascular: S1 and S2 Present, no Murmur, no JVD Respiratory: Bilateral Air entry present and Clear to Auscultation, no Crackles, no wheezes Abdomen: Bowel Sound present, Soft and no tenderness Extremities: bilateral Pedal edema, no calf tenderness, left leg swollen dermis, knee redness resolved Neurology: Grossly no focal neuro deficit.       The results of significant diagnostics from this hospitalization (including imaging, microbiology, ancillary and laboratory) are listed below for reference.    Significant Diagnostic Studies: Ct Abdomen Pelvis Wo Contrast  Result Date: 07/31/2018 CLINICAL DATA:  Nausea and diarrhea for 4 days. Dizziness when standing. EXAM: CT ABDOMEN AND PELVIS WITHOUT CONTRAST TECHNIQUE: Multidetector CT imaging of the abdomen and pelvis was performed following the standard protocol without IV contrast. COMPARISON:  None. FINDINGS: Lower chest: Lung bases are clear. Hepatobiliary: No focal liver abnormality is seen. No gallstones,  gallbladder wall thickening, or biliary dilatation. Pancreas: Unremarkable. No pancreatic ductal dilatation or surrounding inflammatory changes. Spleen: Normal in size without focal abnormality. Adrenals/Urinary Tract: Adrenal glands are unremarkable. Kidneys are normal, without renal calculi, focal lesion, or hydronephrosis. Bladder is unremarkable. Stomach/Bowel: Stomach, small bowel, and colon are not abnormally distended. Scattered diverticula throughout the colon. No inflammatory changes. Appendix is normal. Vascular/Lymphatic: No significant vascular findings are present. No enlarged abdominal or pelvic lymph nodes. Reproductive: Uterus and bilateral adnexa are unremarkable. Other: No free air or free fluid in the abdomen. There is a moderately prominent lymph node in  the left groin region with surrounding soft tissue infiltration likely representing inflammatory or infected node. Minimal fat containing left inguinal hernia. Musculoskeletal: Mild degenerative changes in the spine. No destructive bone lesions. IMPRESSION: 1. Prominent lymph node in the left groin region with surrounding soft tissue infiltration likely representing inflammatory or infected node. 2. No evidence of bowel obstruction or inflammation. Diverticulosis of the colon without evidence of diverticulitis. Electronically Signed   By: Lucienne Capers M.D.   On: 07/31/2018 00:27   Dg Chest 2 View  Result Date: 07/30/2018 CLINICAL DATA:  Cough and shortness of breath EXAM: CHEST - 2 VIEW COMPARISON:  Chest radiograph November 30, 2017 and chest CT Mar 12, 2018 FINDINGS: There is no edema or consolidation. Heart size and pulmonary vascularity are normal. No adenopathy. No pneumothorax. No bone lesions. IMPRESSION: No edema or consolidation. Electronically Signed   By: Lowella Grip III M.D.   On: 07/30/2018 20:55   Ct Tibia Fibula Left Wo Contrast  Result Date: 07/31/2018 CLINICAL DATA:  Rash on the left tibia with redness, swelling,  and fever. Osteomyelitis is suspected. EXAM: CT OF THE LOWER LEFT EXTREMITY WITHOUT CONTRAST TECHNIQUE: Multidetector CT imaging of the lower left extremity was performed according to the standard protocol. COMPARISON:  Left knee radiographs 11/12/2016 FINDINGS: Bones/Joint/Cartilage Left total knee arthroplasty. There is mild lucency at the bone-cement interface of the tibial compartment in the medial tibial plateau. This could indicate loosening. Otherwise, components appear well seated. Visualization is somewhat limited in some areas due to streak artifact from the hardware itself. No acute fracture or dislocation. No focal bone lesion or bone destruction. Ligaments Suboptimally assessed by CT. Muscles and Tendons Appear intact. Soft tissues Mild diffuse infiltration throughout the subcutaneous fat likely due to edema although cellulitis could also have this appearance. More prominent focal infiltration in the subcutaneous fat anteriorly inferior to the patella. No discrete loculated collection is identified. No soft tissue gas. IMPRESSION: 1. Left total knee arthroplasty. There is mild lucency at the bone-cement interface of the tibial compartment in the medial tibial plateau. This could indicate loosening. Otherwise, components appear well seated. No acute fracture or dislocation is identified. 2. Mild diffuse infiltration throughout the subcutaneous fat likely due to edema although cellulitis could also have this appearance. More prominent focal infiltration in the subcutaneous fat anteriorly inferior to the patella. No discrete abscess is identified by CT. Electronically Signed   By: Lucienne Capers M.D.   On: 07/31/2018 05:40    Microbiology: Recent Results (from the past 240 hour(s))  Blood Culture (routine x 2)     Status: None   Collection Time: 07/30/18  7:51 PM  Result Value Ref Range Status   Specimen Description BLOOD RIGHT ANTECUBITAL  Final   Special Requests   Final    BOTTLES DRAWN  AEROBIC AND ANAEROBIC Blood Culture adequate volume   Culture   Final    NO GROWTH 5 DAYS Performed at [facility], [address] [place], [place] [address]    Report Status 08/04/2018 FINAL  Final  Blood Culture (routine x 2)     Status: None   Collection Time: 07/30/18  8:07 PM  Result Value Ref Range Status   Specimen Description BLOOD RIGHT WRIST  Final   Special Requests   Final    BOTTLES DRAWN AEROBIC AND ANAEROBIC Blood Culture adequate volume   Culture   Final    NO GROWTH 5 DAYS Performed at [facility], [place]  [address] [place], [place] [address]    Report Status 08/04/2018 FINAL  Final  Urine culture     Status: None   Collection Time: 07/30/18 10:39 PM  Result Value Ref Range Status   Specimen Description URINE, CATHETERIZED  Final   Special Requests NONE  Final   Culture   Final    NO GROWTH Performed at [facility], [place] [address] [place], [place] [address]    Report Status 08/01/2018 FINAL  Final  MRSA PCR Screening     Status: None   Collection Time: 08/02/18 11:25 AM  Result Value Ref Range Status   MRSA by PCR NEGATIVE NEGATIVE Final    Comment:        The GeneXpert MRSA Assay (FDA approved for NASAL specimens only), is one component of a comprehensive MRSA colonization surveillance program. It is not intended to diagnose MRSA infection nor to guide or monitor treatment for MRSA infections. Performed at [facility], [place] [address], [place], [place] [address]      Labs: CBC: Recent Labs  Lab 07/30/18 1932 07/31/18 0341 08/02/18 0258  WBC 10.9* 14.0* 14.5*  NEUTROABS 7.7  --  12.6*  HGB 13.8 12.7 11.1*  HCT 44.1 40.2 35.6*  MCV 83.1 82.0 82.2  PLT 182 167 680   Basic Metabolic Panel: Recent Labs  Lab 07/30/18 1932 07/31/18 0341 08/02/18 0258  NA 130* 133* 139  K 5.2* 4.1 4.6  CL 95* 100 103  CO2 _0 GLUCOSE 130* 197* 125*  BUN _1 CREATININE 1.53* 1.05* 0.79  CALCIUM 8.4* 8.2* 8.4*  MG   --   --  1.9   Liver Function Tests: Recent Labs  Lab 07/30/18 1932 08/02/18 0258  AST 46* 62*  ALT 27 49*  ALKPHOS 48 48  BILITOT 1.7* 0.1*  PROT 7.2 6.1*  ALBUMIN 2.7* 2.3*   No results for input(s): LIPASE, AMYLASE in the last 168 hours. No results for input(s): AMMONIA in the last 168 hours. Cardiac Enzymes: Recent Labs  Lab 07/31/18 0341  CKTOTAL 165   BNP (last 3 results) Recent Labs    10/20/17 1352 11/30/17 0904  BNP 14.3 97.0   CBG: No results for input(s): GLUCAP in the last 168 hours. Time spent: 35 minutes  Signed:  Berle Mull  [facility] 08/02/2018 , 6:46 AM

## 2018-08-07 ENCOUNTER — Encounter (HOSPITAL_COMMUNITY): Payer: Self-pay | Admitting: Emergency Medicine

## 2018-08-07 ENCOUNTER — Other Ambulatory Visit: Payer: Self-pay

## 2018-08-07 ENCOUNTER — Inpatient Hospital Stay (HOSPITAL_COMMUNITY)
Admission: EM | Admit: 2018-08-07 | Discharge: 2018-08-10 | DRG: 603 | Disposition: A | Discharge status: Home / Self Care | Payer: Medicaid Other | Attending: Internal Medicine | Admitting: Internal Medicine

## 2018-08-07 ENCOUNTER — Emergency Department (HOSPITAL_COMMUNITY): Payer: Medicaid Other

## 2018-08-07 DIAGNOSIS — Z7951 Long term (current) use of inhaled steroids: Secondary | ICD-10-CM

## 2018-08-07 DIAGNOSIS — L538 Other specified erythematous conditions: Secondary | ICD-10-CM

## 2018-08-07 DIAGNOSIS — L02426 Furuncle of left lower limb: Secondary | ICD-10-CM | POA: Diagnosis present

## 2018-08-07 DIAGNOSIS — I10 Essential (primary) hypertension: Secondary | ICD-10-CM | POA: Diagnosis not present

## 2018-08-07 DIAGNOSIS — Z79899 Other long term (current) drug therapy: Secondary | ICD-10-CM

## 2018-08-07 DIAGNOSIS — K219 Gastro-esophageal reflux disease without esophagitis: Secondary | ICD-10-CM | POA: Diagnosis not present

## 2018-08-07 DIAGNOSIS — L26 Exfoliative dermatitis: Secondary | ICD-10-CM

## 2018-08-07 DIAGNOSIS — Z87891 Personal history of nicotine dependence: Secondary | ICD-10-CM

## 2018-08-07 DIAGNOSIS — J45909 Unspecified asthma, uncomplicated: Secondary | ICD-10-CM | POA: Diagnosis present

## 2018-08-07 DIAGNOSIS — M79605 Pain in left leg: Secondary | ICD-10-CM

## 2018-08-07 DIAGNOSIS — Z96652 Presence of left artificial knee joint: Secondary | ICD-10-CM | POA: Diagnosis present

## 2018-08-07 DIAGNOSIS — D509 Iron deficiency anemia, unspecified: Secondary | ICD-10-CM | POA: Diagnosis not present

## 2018-08-07 DIAGNOSIS — L03116 Cellulitis of left lower limb: Secondary | ICD-10-CM | POA: Diagnosis not present

## 2018-08-07 DIAGNOSIS — J45901 Unspecified asthma with (acute) exacerbation: Secondary | ICD-10-CM

## 2018-08-07 DIAGNOSIS — F1410 Cocaine abuse, uncomplicated: Secondary | ICD-10-CM | POA: Diagnosis present

## 2018-08-07 DIAGNOSIS — Z471 Aftercare following joint replacement surgery: Secondary | ICD-10-CM | POA: Diagnosis not present

## 2018-08-07 DIAGNOSIS — Z91040 Latex allergy status: Secondary | ICD-10-CM

## 2018-08-07 DIAGNOSIS — G4733 Obstructive sleep apnea (adult) (pediatric): Secondary | ICD-10-CM | POA: Diagnosis not present

## 2018-08-07 DIAGNOSIS — L03119 Cellulitis of unspecified part of limb: Secondary | ICD-10-CM | POA: Diagnosis present

## 2018-08-07 DIAGNOSIS — Z9989 Dependence on other enabling machines and devices: Secondary | ICD-10-CM

## 2018-08-07 LAB — CBC WITH DIFFERENTIAL/PLATELET
Abs Immature Granulocytes: 0.1 10*3/uL — ABNORMAL HIGH (ref 0.00–0.07)
Basophils Absolute: 0 10*3/uL (ref 0.0–0.1)
Basophils Relative: 0 %
Eosinophils Absolute: 0.1 10*3/uL (ref 0.0–0.5)
Eosinophils Relative: 1 %
HCT: 39.7 % (ref 36.0–46.0)
HEMOGLOBIN: 11.8 g/dL — AB (ref 12.0–15.0)
Immature Granulocytes: 2 %
LYMPHS PCT: 51 %
Lymphs Abs: 3.2 10*3/uL (ref 0.7–4.0)
MCH: 25.3 pg — AB (ref 26.0–34.0)
MCHC: 29.7 g/dL — ABNORMAL LOW (ref 30.0–36.0)
MCV: 85 fL (ref 80.0–100.0)
MONO ABS: 0.6 10*3/uL (ref 0.1–1.0)
Monocytes Relative: 9 %
Neutro Abs: 2.4 10*3/uL (ref 1.7–7.7)
Neutrophils Relative %: 37 %
Platelets: 540 10*3/uL — ABNORMAL HIGH (ref 150–400)
RBC: 4.67 MIL/uL (ref 3.87–5.11)
RDW: 20.3 % — ABNORMAL HIGH (ref 11.5–15.5)
WBC: 6.4 10*3/uL (ref 4.0–10.5)
nRBC: 0 % (ref 0.0–0.2)

## 2018-08-07 LAB — URINALYSIS, ROUTINE W REFLEX MICROSCOPIC
Bacteria, UA: NONE SEEN
Bilirubin Urine: NEGATIVE
Glucose, UA: NEGATIVE mg/dL
HGB URINE DIPSTICK: NEGATIVE
Ketones, ur: NEGATIVE mg/dL
Leukocytes, UA: NEGATIVE
Nitrite: NEGATIVE
PH: 5 (ref 5.0–8.0)
Protein, ur: NEGATIVE mg/dL
SPECIFIC GRAVITY, URINE: 1.033 — AB (ref 1.005–1.030)

## 2018-08-07 LAB — COMPREHENSIVE METABOLIC PANEL
ALBUMIN: 2.8 g/dL — AB (ref 3.5–5.0)
ALT: 38 U/L (ref 0–44)
AST: 28 U/L (ref 15–41)
Alkaline Phosphatase: 48 U/L (ref 38–126)
Anion gap: 6 (ref 5–15)
BUN: 9 mg/dL (ref 6–20)
CALCIUM: 8.4 mg/dL — AB (ref 8.9–10.3)
CO2: 26 mmol/L (ref 22–32)
Chloride: 107 mmol/L (ref 98–111)
Creatinine, Ser: 0.93 mg/dL (ref 0.44–1.00)
GFR calc non Af Amer: >60 mL/min (ref >60)
GLUCOSE: 116 mg/dL — AB (ref 70–99)
Potassium: 4.3 mmol/L (ref 3.5–5.1)
SODIUM: 139 mmol/L (ref 135–145)
Total Bilirubin: 0.4 mg/dL (ref 0.3–1.2)
Total Protein: 6.2 g/dL — ABNORMAL LOW (ref 6.5–8.1)

## 2018-08-07 LAB — SEDIMENTATION RATE: SED RATE: 35 mm/h — AB (ref 0–22)

## 2018-08-07 LAB — BRAIN NATRIURETIC PEPTIDE: B Natriuretic Peptide: 51.5 pg/mL (ref 0.0–100.0)

## 2018-08-07 LAB — C-REACTIVE PROTEIN: CRP: 0.9 mg/dL (ref <1.0)

## 2018-08-07 LAB — HCG, QUANTITATIVE, PREGNANCY: hCG, Beta Chain, Quant, S: 3 m[IU]/mL (ref <5)

## 2018-08-07 LAB — I-STAT CG4 LACTIC ACID, ED: Lactic Acid, Venous: 0.91 mmol/L (ref 0.5–1.9)

## 2018-08-07 MED ORDER — MONTELUKAST SODIUM 10 MG PO TABS
10.0000 mg | ORAL_TABLET | Freq: Every day | ORAL | Status: DC
  Administered 2018-08-08 – 2018-08-09 (×3): 10 mg via ORAL
  Filled 2018-08-07 (×4): qty 1

## 2018-08-07 MED ORDER — FLUCONAZOLE 100 MG PO TABS
100.0000 mg | ORAL_TABLET | Freq: Every day | ORAL | Status: DC
  Administered 2018-08-08: 100 mg via ORAL
  Filled 2018-08-07: qty 1

## 2018-08-07 MED ORDER — PANTOPRAZOLE SODIUM 40 MG PO TBEC: 40.0000 mg | DELAYED_RELEASE_TABLET | Freq: Every day | ORAL | Status: DC | PRN

## 2018-08-07 MED ORDER — SENNOSIDES-DOCUSATE SODIUM 8.6-50 MG PO TABS: 1.0000 | ORAL_TABLET | Freq: Every evening | ORAL | Status: DC | PRN

## 2018-08-07 MED ORDER — ONDANSETRON HCL 4 MG PO TABS: 4.0000 mg | ORAL_TABLET | Freq: Four times a day (QID) | ORAL | Status: DC | PRN

## 2018-08-07 MED ORDER — HYDROXYZINE HCL 25 MG PO TABS
25.0000 mg | ORAL_TABLET | Freq: Three times a day (TID) | ORAL | Status: DC | PRN
  Administered 2018-08-07 – 2018-08-09 (×5): 25 mg via ORAL
  Filled 2018-08-07 (×5): qty 1

## 2018-08-07 MED ORDER — ONDANSETRON HCL 4 MG/2ML IJ SOLN: 4.0000 mg | Freq: Four times a day (QID) | INTRAMUSCULAR | Status: DC | PRN

## 2018-08-07 MED ORDER — CLINDAMYCIN PHOSPHATE 600 MG/50ML IV SOLN
600.0000 mg | Freq: Once | INTRAVENOUS | Status: AC
  Administered 2018-08-07: 600 mg via INTRAVENOUS
  Filled 2018-08-07: qty 50

## 2018-08-07 MED ORDER — SODIUM CHLORIDE 0.9 % IV SOLN
2.0000 g | INTRAVENOUS | Status: DC
  Administered 2018-08-08: 2 g via INTRAVENOUS
  Filled 2018-08-07: qty 20

## 2018-08-07 MED ORDER — ENOXAPARIN SODIUM 40 MG/0.4ML ~~LOC~~ SOLN
40.0000 mg | Freq: Every day | SUBCUTANEOUS | Status: DC
  Administered 2018-08-08 – 2018-08-10 (×3): 40 mg via SUBCUTANEOUS
  Filled 2018-08-07 (×3): qty 0.4

## 2018-08-07 MED ORDER — ACETAMINOPHEN 325 MG PO TABS: 650.0000 mg | ORAL_TABLET | Freq: Four times a day (QID) | ORAL | Status: DC | PRN

## 2018-08-07 MED ORDER — OXYCODONE-ACETAMINOPHEN 5-325 MG PO TABS
1.0000 | ORAL_TABLET | ORAL | Status: DC | PRN
  Administered 2018-08-07 – 2018-08-09 (×5): 1 via ORAL
  Filled 2018-08-07 (×5): qty 1

## 2018-08-07 MED ORDER — OXYCODONE-ACETAMINOPHEN 5-325 MG PO TABS
1.0000 | ORAL_TABLET | Freq: Once | ORAL | Status: AC
  Administered 2018-08-07: 1 via ORAL
  Filled 2018-08-07: qty 1

## 2018-08-07 MED ORDER — VANCOMYCIN HCL 10 G IV SOLR
2000.0000 mg | Freq: Once | INTRAVENOUS | Status: AC
  Administered 2018-08-07: 2000 mg via INTRAVENOUS
  Filled 2018-08-07: qty 2000

## 2018-08-07 MED ORDER — FLUTICASONE PROPIONATE 50 MCG/ACT NA SUSP
1.0000 | Freq: Every day | NASAL | Status: DC | PRN
  Filled 2018-08-07: qty 16

## 2018-08-07 MED ORDER — BUDESONIDE 0.25 MG/2ML IN SUSP
0.2500 mg | Freq: Three times a day (TID) | RESPIRATORY_TRACT | Status: DC
  Filled 2018-08-07 (×2): qty 2

## 2018-08-07 MED ORDER — METHOCARBAMOL 500 MG PO TABS
500.0000 mg | ORAL_TABLET | Freq: Three times a day (TID) | ORAL | Status: DC | PRN
  Administered 2018-08-08: 500 mg via ORAL
  Filled 2018-08-07 (×3): qty 1

## 2018-08-07 MED ORDER — GUAIFENESIN ER 600 MG PO TB12
1200.0000 mg | ORAL_TABLET | Freq: Two times a day (BID) | ORAL | Status: DC | PRN
  Administered 2018-08-08: 1200 mg via ORAL
  Filled 2018-08-07: qty 2

## 2018-08-07 MED ORDER — VANCOMYCIN HCL IN DEXTROSE 750-5 MG/150ML-% IV SOLN
750.0000 mg | Freq: Two times a day (BID) | INTRAVENOUS | Status: DC
  Filled 2018-08-07: qty 150

## 2018-08-07 MED ORDER — VANCOMYCIN HCL IN DEXTROSE 1-5 GM/200ML-% IV SOLN: 1000.0000 mg | Freq: Once | INTRAVENOUS | Status: DC

## 2018-08-07 NOTE — ED Provider Notes (Signed)
[facility] EMERGENCY DEPARTMENT Provider Note   CSN: [id number] Arrival date & time: 08/07/18  1604     History   Chief Complaint Chief Complaint  Patient presents with  . Cellulitis    HPI Kathleen Rodriguez is a 52 y.o. female.  52yo F w/ PMH including CHF, asthma, cocaine abuse, OSA who p/w L leg swelling. She was recently admitted for asthma and L leg cellulitis. Discharged on doxycycline and cefuroxime which she has been taking as directed.  She reports that since discharge her leg has become progressively more swollen and continues to be painful.  She is recently developed a blistering over her rash which she did not have in the hospital.  She has tried to elevate her leg and rest as much as possible.  No fevers.   The history is provided by the patient.    Past Medical History:  Diagnosis Date  . Arthritis    "knees, lower back; legs, ankles" (01/27/2016)  . Asthma    followed by Dr. Craige Cotta  . CHF (congestive heart failure) (HCC) 2016   "when I went into a coma"  . Cocaine abuse (HCC)   . 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 Dyspnea   . GERD (gastroesophageal reflux disease)   . Hypertension    "doctor took me off RX in 2016" (01/27/2016)  . Influenza B April 2014   Complicated by multi-organ failure  . OSA on CPAP "since " 03/20/2013  . Pneumonia 2016  . Required emergent intubation    asthma exacerbation in 2016  . Tobacco abuse   . Upper airway cough syndrome     Patient Active Problem List   Diagnosis Date Noted  . Cellulitis of left leg 08/07/2018  . ARF (acute renal failure) (HCC) 07/31/2018  . Nausea vomiting and diarrhea 07/31/2018  . SIRS (systemic inflammatory response syndrome) (HCC) 07/30/2018  . Asthma 10/22/2017  . Leukocytosis 07/11/2017  . Acute bronchitis   . Hypokalemia 07/10/2017  . Eczema 05/26/2017  . Osteoarthritis of left knee 09/01/2016  . 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 Knee pain, 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   . Status asthmaticus 06/24/2016  . Acute respiratory failure (HCC) 06/24/2016  . Acute asthma exacerbation 05/12/2016  . Acute respiratory failure with hypoxia (HCC) 05/12/2016  . Tobacco abuse   . Primary osteoarthritis of left knee 03/09/2016  . Asthma with status asthmaticus 01/27/2016  . Acute respiratory distress 01/27/2016  . Non compliance with medical treatment 08/19/2015  . Anemia, iron deficiency 08/12/2015  . Essential hypertension 07/25/2015  . Sinus tachycardia (HCC) 07/25/2015  . Acute respiratory failure with hypercapnia (HCC) 07/24/2015  . COPD (chronic obstructive pulmonary disease) (HCC) 12/28/2014  . Tracheobronchitis 12/28/2014  . Dysfunctional uterine bleeding 12/28/2014  . Anemia 12/28/2014  . Pleuritic chest pain 05/16/2014  . Morbid obesity (HCC) 05/16/2014  . Chronic cough 05/01/2014  . Upper airway cough syndrome 05/01/2014  . Vaginal candidiasis 04/09/2014  . GERD (gastroesophageal reflux disease) 04/08/2014  . Medically noncompliant 04/07/2014  . Asthma exacerbation 04/06/2014  . CAP (community acquired pneumonia) 04/06/2014  . Chest pain 04/01/2014  . Hypertension 09/24/2013  . OSA (obstructive sleep apnea) 03/20/2013  . Protein calorie malnutrition (HCC) 02/13/2013  . Uncontrolled persistent asthma 02/05/2013    Past Surgical History:  Procedure Laterality Date  . BREAST SURGERY Right 1980   as a teenager , cyst was benign  . CESAREAN SECTION  2006  . LACERATION REPAIR Right ~ 1997   "tried  to cut myself"  . 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 TUBAL LIGATION  2006     OB History   None      Home Medications    Prior to Admission medications   Medication Sig Start Date End Date Taking? Authorizing Provider  acetaminophen (TYLENOL) 500 MG tablet Take 500 mg by mouth every 6 (six) hours as needed  for mild pain.   Yes [provider]  albuterol (PROVENTIL HFA;VENTOLIN HFA) 108 (90 Base) MCG/ACT inhaler Inhale 2 puffs into the lungs every 6 (six) hours as needed for wheezing or shortness of breath. 04/13/18  Yes [name], MD  albuterol (PROVENTIL) (2.5 MG/3ML) 0.083% nebulizer solution Take 3 mLs (2.5 mg total) by nebulization every 6 (six) hours as needed for wheezing or shortness of breath. 02/19/18  Yes Sood, Laurier Nancy, MD  budesonide (PULMICORT) 0.25 MG/2ML nebulizer solution Take 2 mLs (0.25 mg total) by nebulization 2 (two) times daily. Patient taking differently: Take 0.25 mg by nebulization 3 (three) times daily.  02/19/18 02/19/19 Yes [name], MD  cefUROXime (CEFTIN) 500 MG tablet Take 1 tablet (500 mg total) by mouth 2 (two) times daily for 10 days. 08/02/18 08/12/18 Yes Rolly Salter, MD  doxycycline (VIBRA-TABS) 100 MG tablet Take 1 tablet (100 mg total) by mouth 2 (two) times daily for 7 days. 08/02/18 08/09/18 Yes Rolly Salter, MD  fluticasone (FLONASE) 50 MCG/ACT nasal spray Place 1 spray into both nostrils daily. Patient taking differently: Place 1 spray into both nostrils daily as needed for allergies.  02/19/18  Yes [name], MD  guaiFENesin (MUCINEX) 600 MG 12 hr tablet Take 1 tablet (600 mg total) by mouth 2 (two) times daily. Patient taking differently: Take 1,200 mg by mouth 2 (two) times daily as needed (congestion).  12/02/17  Yes Hongalgi, Maximino Greenland, MD  hydrOXYzine (ATARAX/VISTARIL) 25 MG tablet Take 1 tablet (25 mg total) by mouth 3 (three) times daily as needed for itching. 08/02/18  Yes Rolly Salter, MD  methocarbamol (ROBAXIN) 500 MG tablet Take 1 tablet (500 mg total) by mouth every 8 (eight) hours as needed for muscle spasms. 08/02/18  Yes Rolly Salter, MD  montelukast (SINGULAIR) 10 MG tablet Take 1 tablet (10 mg total) by mouth at bedtime. 06/06/18  Yes [name], MD  pantoprazole (PROTONIX) 40 MG tablet Take 40 mg by mouth daily as needed  (acid reflux).   Yes [provider]  Spacer/Aero-Holding Chambers (AEROCHAMBER MV) inhaler Use as instructed 07/11/18   [name], MD    Family History Family History  Problem Relation Age of Onset  . Hypertension Mother   . HIV/AIDS Father     Social History Social History   Tobacco Use  . Smoking status: Former Smoker    Packs/day: 0.25    Years: 20.00    Pack years: 5.00    Types: Cigarettes    Last attempt to quit: 01/31/2013    Years since quitting: 5.5  . Smokeless tobacco: Never Used  Substance Use Topics  . 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 Drug use: No    Types: "Crack" cocaine    Comment: 01/27/2016 "clean from smoking crack since 2007"     Allergies   Tomato; Latex; and Wool alcohol [lanolin]   Review of Systems  Review of Systems All other systems reviewed and are negative except that which was mentioned in HPI   Physical Exam Updated Vital Signs BP (!) 169/94 (BP Location: Right Arm)   Pulse 90   Temp 97.8 F (36.6 C) (Oral)   Resp 18   Ht 5\' 6"  (1.676 m)   Wt 110.2 kg   SpO2 97%   BMI 39.22 kg/m   Physical Exam  Constitutional: She is oriented to person, place, and time. She appears well-developed and well-nourished. No distress.  HENT:  Head: Normocephalic and atraumatic.  Moist mucous membranes  Eyes: Conjunctivae are normal.  Neck: Neck supple.  Cardiovascular: Normal rate, regular rhythm, normal heart sounds and intact distal pulses.  No murmur heard. Pulmonary/Chest: Effort normal and breath sounds normal.  Abdominal: Soft. Bowel sounds are normal. She exhibits no distension. There is no tenderness.  Musculoskeletal: She exhibits edema and tenderness.  Significant edema and induration of L lower leg from knee to just above ankle; edema extending onto foot; erythema on lower leg with central bullae; tender to palpation  Neurological: She is alert and oriented to person, place, and time.   Fluent speech  Skin: Skin is warm and dry. Rash noted. There is erythema.  Psychiatric: She has a normal mood and affect. Judgment normal.  Nursing note and vitals reviewed.      ED Treatments / Results  Labs (all labs ordered are listed, but only abnormal results are displayed) Labs Reviewed  CBC WITH DIFFERENTIAL/PLATELET - Abnormal; Notable for the following components:      Result Value   Hemoglobin 11.8 (*)    MCH 25.3 (*)    MCHC 29.7 (*)    RDW 20.3 (*)    Platelets 540 (*)    Abs Immature Granulocytes 0.10 (*)    All other components within normal limits  COMPREHENSIVE METABOLIC PANEL - Abnormal; Notable for the following components:   Glucose, Bld 116 (*)    Calcium 8.4 (*)    Total Protein 6.2 (*)    Albumin 2.8 (*)    All other components within normal limits  URINALYSIS, ROUTINE W REFLEX MICROSCOPIC - Abnormal; Notable for the following components:   Specific Gravity, Urine 1.033 (*)    All other components within normal limits  SEDIMENTATION RATE - Abnormal; Notable for the following components:   Sed Rate 35 (*)    All other components within normal limits  CULTURE, BLOOD (ROUTINE X 2)  CULTURE, BLOOD (ROUTINE X 2)  C-REACTIVE PROTEIN  BRAIN NATRIURETIC PEPTIDE  HCG, QUANTITATIVE, PREGNANCY  RAPID URINE DRUG SCREEN, HOSP PERFORMED  BASIC METABOLIC PANEL  CBC  I-STAT CG4 LACTIC ACID, ED    EKG EKG Interpretation  Date/Time:  Tuesday August 07 2018 22:19:20 EDT Ventricular Rate:  86 PR Interval:    QRS Duration: 94 QT Interval:  364 QTC Calculation: 436 R Axis:   72 Text Interpretation:  Sinus rhythm Prolonged PR interval Minimal ST depression, inferior leads ST elev, probable normal early repol pattern When compared with ECG of 07/30/2018, HEART RATE has decreased Confirmed by Dione Booze ([id number]) on 08/07/2018 10:53:22 PM   Radiology Dg Tibia/fibula Left  Result Date: 08/07/2018 CLINICAL DATA:  Initial evaluation for left lower extremity  cellulitis, swelling. EXAM: LEFT TIBIA AND FIBULA - 2 VIEW COMPARISON:  Prior CT from 07/31/2018. FINDINGS: No acute fracture dislocation. 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 Mild diffuse soft tissue swelling about the left leg. No soft tissue emphysema. No radiopaque  foreign body. No osseous erosion to suggest osteomyelitis. IMPRESSION: 1. Mild diffuse soft tissue swelling throughout the left lower extremity. No radiopaque foreign body or soft tissue emphysema. 2. No acute osseous abnormality. No radiographic evidence for osteomyelitis. 3. 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 Electronically Signed   By: Rise Mu M.D.   On: 08/07/2018 20:35    Procedures Procedures (including critical care time)  Medications Ordered in ED Medications  oxyCODONE-acetaminophen (PERCOCET/ROXICET) 5-325 MG per tablet 1 tablet (1 tablet Oral Given 08/07/18 2328)  acetaminophen (TYLENOL) tablet 650 mg (has no administration in time range)  hydrOXYzine (ATARAX/VISTARIL) tablet 25 mg (25 mg Oral Given 08/07/18 2328)  pantoprazole (PROTONIX) EC tablet 40 mg (has no administration in time range)  methocarbamol (ROBAXIN) tablet 500 mg (has no administration in time range)  budesonide (PULMICORT) nebulizer solution 0.25 mg (has no administration in time range)  fluticasone (FLONASE) 50 MCG/ACT nasal spray 1 spray (has no administration in time range)  guaiFENesin (MUCINEX) 12 hr tablet 1,200 mg (has no administration in time range)  montelukast (SINGULAIR) tablet 10 mg (10 mg Oral Given 08/08/18 0001)  cefTRIAXone (ROCEPHIN) 2 g in sodium chloride 0.9 % 100 mL IVPB (2 g Intravenous New Bag/Given 08/08/18 0003)  fluconazole (DIFLUCAN) tablet 100 mg (100 mg Oral Given 08/08/18 0001)  enoxaparin (LOVENOX) injection 40 mg (has no administration in time range)  ondansetron (ZOFRAN) tablet 4 mg (has no administration in time range)    Or  ondansetron (ZOFRAN)  injection 4 mg (has no administration in time range)  senna-docusate (Senokot-S) tablet 1 tablet (has no administration in time range)  vancomycin (VANCOCIN) IVPB 750 mg/150 ml premix (has no administration in time range)  oxyCODONE-acetaminophen (PERCOCET/ROXICET) 5-325 MG per tablet 1 tablet (1 tablet Oral Given 08/07/18 1756)  clindamycin (CLEOCIN) IVPB 600 mg (0 mg Intravenous Stopped 08/07/18 2054)  vancomycin (VANCOCIN) 2,000 mg in sodium chloride 0.9 % 500 mL IVPB (0 mg Intravenous Stopped 08/07/18 2328)     Initial Impression / Assessment and Plan / ED Course  I have reviewed the triage vital signs and the nursing notes.  Pertinent labs & imaging results that were available during my care of the patient were reviewed by me and considered in my medical decision making (see chart for details).     Nontoxic on exam, afebrile.  Her lab work shows normal lactate, normal WBC count, reassuring CMP.  I was able to review photos from previous hospitalization and her exam today does appear worse compared to previous, which is concerning given that she has been compliant with antibiotics.  X-ray negative for gas and exam is reassuring against necrotizing fasciitis.  Given worsening swelling and blistering despite outpatient antibiotics, recommended admission for repeat IV antibiotics.  Gave vancomycin and clindamycin.  Discussed w/ Dr. Clyde Lundborg and pt admitted for further care.  Final Clinical Impressions(s) / ED Diagnoses   Final diagnoses:  None    ED Discharge Orders    None       Little, Ambrose Finland, MD 08/08/18 0041

## 2018-08-07 NOTE — Progress Notes (Signed)
Pharmacy Antibiotic Note  Kathleen Rodriguez is a 52 y.o. female admitted on 08/07/2018 with cellulitis.  Pharmacy has been consulted for vancomycin dosing. WBC - 6.4, afebrile, lactic acid -0.91  Plan: Vancomycin 2000mg  x1, then Vancomycin 750mg  q12h Ceftriaxone 2g q24h per MD Monitor and adjust abx pending C&S, vanc trough as needed  Height: 5\' 6"  (167.6 cm) Weight: 243 lb (110.2 kg) IBW/kg (Calculated) : 59.3  Temp (24hrs), Avg:97.6 F (36.4 C), Min:97.6 F (36.4 C), Max:97.6 F (36.4 C)  Recent Labs  Lab 08/02/18 0258 08/07/18 1808 08/07/18 1822  WBC 14.5* 6.4  --   CREATININE 0.79 0.93  --   LATICACIDVEN  --   --  0.91    Estimated Creatinine Clearance: 89 mL/min (by C-G formula based on SCr of 0.93 mg/dL).    Allergies  Allergen Reactions  . Tomato Hives, Itching and Other (See Comments)    ALSO REACTS TO KETCHUP  . Latex Itching and Rash  . Wool Alcohol [Lanolin] Itching    Antimicrobials this admission: Clindamycin 10/15 x1 Ceftriaxone 10/15 >>  Vancomycin 10/15 >>  Dose adjustments this admission: N/A  Microbiology results: Pending  Thank you for allowing pharmacy to be a part of this patient's care.  Koleen Nimrod 08/07/2018 10:05 PM

## 2018-08-07 NOTE — ED Provider Notes (Signed)
Patient placed in Quick Look pathway, seen and evaluated   Chief Complaint: leg pain  HPI:  Pt discharged from hospital 5 days ago for SIRS due to cellulitis of left leg. States has been compliant with antibiotics but left leg is getting worse. Continues to have chills. States now leg is blistering, red, more swollen, painful. Denies any other complaints.   ROS: left leg pain, erythema, swelling  Physical Exam:   Gen: No distress  Neuro: Awake and Alert  Skin: Warm    Focused Exam: Erythema to the left lower leg with blistering.  Swelling to the foot all the way up to the knee.  Tender to palpation.  Warm.  She recently admitted for sepsis due to cellulitis of the left leg, states is worsening.  She does have erythema to the left lower leg with blistering.  It is warm to the touch.  She reports persistent chills.  I will reorder blood tests including blood cultures and lactic acid.  Patient requesting pain medication, I will give her Percocet.  Vitals:   08/07/18 1618 08/07/18 1724  BP: (!) 152/89   Pulse: 91   Resp: 16   Temp: 97.6 F (36.4 C)   TempSrc: Oral   SpO2: 99%   Weight:  110.2 kg  Height:  5\' 6"  (1.676 m)      Initiation of care has begun. The patient has been counseled on the process, plan, and necessity for staying for the completion/evaluation, and the remainder of the medical screening examination    Jaynie Crumble, PA-C 08/07/18 1746    Little, Ambrose Finland, MD 08/08/18 1424

## 2018-08-07 NOTE — ED Triage Notes (Signed)
Pt to ED with c/o cellulitis to left lower leg.  Pt st's she was recently in hosp due to asthma and leg started swelling.  Pt st's she has been taking meds for same but leg continues to get worse

## 2018-08-07 NOTE — H&P (Addendum)
History and Physical    [name] PNT:[id number] DOB: 21-May-1966 DOA: 08/07/2018  Referring MD/NP/PA:   PCP: [name], MD   Patient coming from:  The patient is coming from home.  At baseline, pt is independent for most of ADL.   Chief Complaint: Left leg swelling and pain  HPI: Kathleen Rodriguez is a 52 y.o. female with medical history significant of hypertension, asthma, GERD, cocaine abuse, OSA on CPAP, tobacco abuse, who presents with left leg swelling and pain.  Patient was recently hospitalized from 10/7-10/10 due to asthma exacerbation and left lower leg cellulitis.  Patient was treated with IV antibiotics in hospital for cellulitis and discharged home on doxycycline and Ceftin.  She had lower extremity venous Doppler which was negative for DVT on 07/31/2018. Patient states that she has been taking the 2 antibiotics, but her left leg swelling and pain have been worsening.  The pain is constant, severe, sharp, 10 out of 10 in severity, nonradiating.  She also feels itchy in left lower leg. She does not have fever or chills.  Patient states that her asthma has been well controlled, currently no cough, shortness of breath or chest pain.  She had a one loose stool bowel movement earlier, currently no nausea vomiting, diarrhea or abdominal pain.  No symptoms of UTI or unilateral weakness.  ED Course: pt was found to have WBC 6.4, lactic acid 0.91, negative urinalysis, electrolytes renal function okay, temperature normal, no tachycardia, no tachypnea, oxygen saturation 100% on room air.  X-ray of left lower leg showed soft tissue swelling, but no bony abnormalities or evidence of osteomyelitis.  Review of Systems:   General: no fevers, chills, no body weight gain, has fatigue HEENT: no blurry vision, hearing changes or sore throat Respiratory: no dyspnea, coughing, wheezing CV: no chest pain, no palpitations GI: no nausea, vomiting, abdominal pain, diarrhea, constipation GU: no  dysuria, burning on urination, increased urinary frequency, hematuria  Ext: no leg edema Neuro: no unilateral weakness, numbness, or tingling, no vision change or hearing loss Skin: left lower leg is erythematous, swelling, warm, itchy, and tender. MSK: No muscle spasm, no deformity, no limitation of range of movement in spin Heme: No easy bruising.  Travel history: No recent long distant travel.  Allergy:  Allergies  Allergen Reactions  . Tomato Hives, Itching and Other (See Comments)    ALSO REACTS TO KETCHUP  . Latex Itching and Rash  . Wool Alcohol [Lanolin] Itching    Past Medical History:  Diagnosis Date  . Arthritis    "knees, lower back; legs, ankles" (01/27/2016)  . Asthma    followed by Dr. Halford Chessman  . CHF (congestive heart failure) ([facility]) 2016   "when I went into a coma"  . Cocaine abuse ([facility])   . 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 Dyspnea   . GERD (gastroesophageal reflux disease)   . Hypertension    "doctor took me off RX in 2016" (01/27/2016)  . Influenza B April 0867   Complicated by multi-organ failure  . OSA on CPAP "since " 03/20/2013  . Pneumonia 2016  . Required emergent intubation    asthma exacerbation in 2016  . Tobacco abuse   . Upper airway cough syndrome     Past Surgical History:  Procedure Laterality Date  . [place]   as a teenager , cyst was benign  . CESAREAN SECTION  2006  . LACERATION REPAIR Right ~ 1997   "tried to cut  myself"  . TOTAL KNEE ARTHROPLASTY Left 09/01/2016   Procedure: LEFT TOTAL KNEE ARTHROPLASTY;  Surgeon: Leandrew Koyanagi, MD;  Location: [facility];  Service: Orthopedics;  Laterality: Left;  . TUBAL LIGATION  2006    Social History:  reports that she quit smoking about 5 years ago. Her smoking use included cigarettes. She has a 5.00 pack-year smoking history. She has never used smokeless tobacco. She reports that she does not drink alcohol or use drugs.  Family History:  Family History  Problem Relation Age  of Onset  . Hypertension Mother   . HIV/AIDS Father      Prior to Admission medications   Medication Sig Start Date End Date Taking? Authorizing Provider  acetaminophen (TYLENOL) 500 MG tablet Take 500 mg by mouth every 6 (six) hours as needed for mild pain.   Yes [provider]  albuterol (PROVENTIL HFA;VENTOLIN HFA) 108 (90 Base) MCG/ACT inhaler Inhale 2 puffs into the lungs every 6 (six) hours as needed for wheezing or shortness of breath. 04/13/18  Yes Chesley Mires, MD  albuterol (PROVENTIL) (2.5 MG/3ML) 0.083% nebulizer solution Take 3 mLs (2.5 mg total) by nebulization every 6 (six) hours as needed for wheezing or shortness of breath. 02/19/18  Yes Sood, Elisabeth Cara, MD  budesonide (PULMICORT) 0.25 MG/2ML nebulizer solution Take 2 mLs (0.25 mg total) by nebulization 2 (two) times daily. Patient taking differently: Take 0.25 mg by nebulization 3 (three) times daily.  02/19/18 02/19/19 Yes Chesley Mires, MD  cefUROXime (CEFTIN) 500 MG tablet Take 1 tablet (500 mg total) by mouth 2 (two) times daily for 10 days. 08/02/18 08/12/18 Yes Lavina Hamman, MD  doxycycline (VIBRA-TABS) 100 MG tablet Take 1 tablet (100 mg total) by mouth 2 (two) times daily for 7 days. 08/02/18 08/09/18 Yes Lavina Hamman, MD  fluticasone (FLONASE) 50 MCG/ACT nasal spray Place 1 spray into both nostrils daily. Patient taking differently: Place 1 spray into both nostrils daily as needed for allergies.  02/19/18  Yes Chesley Mires, MD  guaiFENesin (MUCINEX) 600 MG 12 hr tablet Take 1 tablet (600 mg total) by mouth 2 (two) times daily. Patient taking differently: Take 1,200 mg by mouth 2 (two) times daily as needed (congestion).  12/02/17  Yes Hongalgi, Lenis Dickinson, MD  HYDROcodone-acetaminophen (NORCO/VICODIN) 5-325 MG tablet Take 1 tablet by mouth 3 (three) times daily as needed for up to 5 days for severe pain. 08/02/18 08/07/18 Yes Lavina Hamman, MD  hydrOXYzine (ATARAX/VISTARIL) 25 MG tablet Take 1 tablet (25 mg total) by  mouth 3 (three) times daily as needed for itching. 08/02/18  Yes Lavina Hamman, MD  methocarbamol (ROBAXIN) 500 MG tablet Take 1 tablet (500 mg total) by mouth every 8 (eight) hours as needed for muscle spasms. 08/02/18  Yes Lavina Hamman, MD  montelukast (SINGULAIR) 10 MG tablet Take 1 tablet (10 mg total) by mouth at bedtime. 06/06/18  Yes Chesley Mires, MD  pantoprazole (PROTONIX) 40 MG tablet Take 40 mg by mouth daily as needed (acid reflux).   Yes [provider]  Spacer/Aero-Holding Chambers (AEROCHAMBER MV) inhaler Use as instructed 07/11/18   Chesley Mires, MD    Physical Exam: Vitals:   08/07/18 2100 08/07/18 2200 08/07/18 2230 08/07/18 2300  BP: 140/89 (!) 142/78 120/82 (!) 169/94  Pulse: 77 90 90 90  Resp: _0 Temp:    97.8 F (36.6 C)  TempSrc:    Oral  SpO2: 100% 97% 99% 97%  Weight:  Height:       General: Not in acute distress HEENT:       Eyes: PERRL, EOMI, no scleral icterus.       ENT: No discharge from the ears and nose, no pharynx injection, no tonsillar enlargement.        Neck: No JVD, no bruit, no mass felt. Heme: No neck lymph node enlargement. Cardiac: S1/S2, RRR, No murmurs, No gallops or rubs. Respiratory: No rales, wheezing, rhonchi or rubs. GI: Soft, nondistended, nontender, no rebound pain, no organomegaly, BS present. GU: No hematuria Ext: No pitting leg edema bilaterally. 2+DP/PT pulse bilaterally. Musculoskeletal: No joint deformities, No joint redness or warmth, no limitation of ROM in spin. Skin: left lower leg is erythematous, swelling, warm, itchy, and tender, with central bullae. Neuro: Alert, oriented X3, cranial nerves II-XII grossly intact, moves all extremities normally.  Psych: Patient is not psychotic, no suicidal or hemocidal ideation.  Labs on Admission: I have personally reviewed following labs and imaging studies  CBC: Recent Labs  Lab 08/02/18 0258 08/07/18 1808  WBC 14.5* 6.4  NEUTROABS 12.6* 2.4  HGB  11.1* 11.8*  HCT 35.6* 39.7  MCV 82.2 85.0  PLT 245 824*   Basic Metabolic Panel: Recent Labs  Lab 08/02/18 0258 08/07/18 1808  NA 139 139  K 4.6 4.3  CL 103 107  CO2 28 26  GLUCOSE 125* 116*  BUN 18 9  CREATININE 0.79 0.93  CALCIUM 8.4* 8.4*  MG 1.9  --    GFR: Estimated Creatinine Clearance: 89 mL/min (by C-G formula based on SCr of 0.93 mg/dL). Liver Function Tests: Recent Labs  Lab 08/02/18 0258 08/07/18 1808  AST 62* 28  ALT 49* 38  ALKPHOS 48 48  BILITOT 0.1* 0.4  PROT 6.1* 6.2*  ALBUMIN 2.3* 2.8*   No results for input(s): LIPASE, AMYLASE in the last 168 hours. No results for input(s): AMMONIA in the last 168 hours. Coagulation Profile: No results for input(s): INR, PROTIME in the last 168 hours. Cardiac Enzymes: No results for input(s): CKTOTAL, CKMB, CKMBINDEX, TROPONINI in the last 168 hours. BNP (last 3 results) No results for input(s): PROBNP in the last 8760 hours. HbA1C: No results for input(s): HGBA1C in the last 72 hours. CBG: No results for input(s): GLUCAP in the last 168 hours. Lipid Profile: No results for input(s): CHOL, HDL, LDLCALC, TRIG, CHOLHDL, LDLDIRECT in the last 72 hours. Thyroid Function Tests: No results for input(s): TSH, T4TOTAL, FREET4, T3FREE, THYROIDAB in the last 72 hours. Anemia Panel: No results for input(s): VITAMINB12, FOLATE, FERRITIN, TIBC, IRON, RETICCTPCT in the last 72 hours. Urine analysis:    Component Value Date/Time   COLORURINE YELLOW 08/07/2018 1752   APPEARANCEUR CLEAR 08/07/2018 1752   LABSPEC 1.033 (H) 08/07/2018 1752   PHURINE 5.0 08/07/2018 1752   GLUCOSEU NEGATIVE 08/07/2018 1752   HGBUR NEGATIVE 08/07/2018 1752   BILIRUBINUR NEGATIVE 08/07/2018 1752   BILIRUBINUR small 05/26/2017 1539   KETONESUR NEGATIVE 08/07/2018 1752   PROTEINUR NEGATIVE 08/07/2018 1752   UROBILINOGEN 1.0 05/26/2017 1539   UROBILINOGEN 1.0 03/22/2014 0805   NITRITE NEGATIVE 08/07/2018 1752   LEUKOCYTESUR NEGATIVE  08/07/2018 1752   Sepsis Labs: _0 (procalcitonin:4,lacticidven:4) ) Recent Results (from the past 240 hour(s))  Blood Culture (routine x 2)     Status: None   Collection Time: 07/30/18  7:51 PM  Result Value Ref Range Status   Specimen Description BLOOD RIGHT ANTECUBITAL  Final   Special Requests   Final    BOTTLES  DRAWN AEROBIC AND ANAEROBIC Blood Culture adequate volume   Culture   Final    NO GROWTH 5 DAYS Performed at [facility], [place] [address] [place], [place] [address]    Report Status 08/04/2018 FINAL  Final  Blood Culture (routine x 2)     Status: None   Collection Time: 07/30/18  8:07 PM  Result Value Ref Range Status   Specimen Description BLOOD RIGHT WRIST  Final   Special Requests   Final    BOTTLES DRAWN AEROBIC AND ANAEROBIC Blood Culture adequate volume   Culture   Final    NO GROWTH 5 DAYS Performed at [facility], [place] [address] [place], [place] [address]    Report Status 08/04/2018 FINAL  Final  Urine culture     Status: None   Collection Time: 07/30/18 10:39 PM  Result Value Ref Range Status   Specimen Description URINE, CATHETERIZED  Final   Special Requests NONE  Final   Culture   Final    NO GROWTH Performed at [facility], [place] [address] [place], [place] [address]    Report Status 08/01/2018 FINAL  Final  MRSA PCR Screening     Status: None   Collection Time: 08/02/18 11:25 AM  Result Value Ref Range Status   MRSA by PCR NEGATIVE NEGATIVE Final    Comment:        The GeneXpert MRSA Assay (FDA approved for NASAL specimens only), is one component of a comprehensive MRSA colonization surveillance program. It is not intended to diagnose MRSA infection nor to guide or monitor treatment for MRSA infections. Performed at [facility], [place] [address], [place],  [address]      Radiological Exams on Admission: Dg Tibia/fibula Left  Result Date: 08/07/2018 CLINICAL DATA:  Initial evaluation for left  lower extremity cellulitis, swelling. EXAM: LEFT TIBIA AND FIBULA - 2 VIEW COMPARISON:  Prior CT from 07/31/2018. FINDINGS: No acute fracture dislocation. 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 Mild diffuse soft tissue swelling about the left leg. No soft tissue emphysema. No radiopaque foreign body. No osseous erosion to suggest osteomyelitis. IMPRESSION: 1. Mild diffuse soft tissue swelling throughout the left lower extremity. No radiopaque foreign body or soft tissue emphysema. 2. No acute osseous abnormality. No radiographic evidence for osteomyelitis. 3. 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 Electronically Signed   By: Jeannine Boga M.D.   On: 08/07/2018 20:35     EKG: Independently reviewed.  Sinus rhythm, regular, QTc 436, no ischemic change.   Assessment/Plan Principal Problem:   Cellulitis of left leg Active Problems:   OSA (obstructive sleep apnea)   Hypertension   GERD (gastroesophageal reflux disease)   Anemia, iron deficiency   Asthma   Cellulitis of left leg: left lower leg is erythematous, swelling, warm, itchy, and tender, with central bullae, clinically consistent with cellulitis.  Patient also feels itchy, may be complicated by fungal infection, though not very sure. Very unusual looking.  No fever or leukocytosis.  Lactic acid is normal.  Clinically not septic.  - will place on med-surg bed for obs - Empiric antimicrobial treatment with vancomycin, Rocephin (patient also received 1 dose of clindamycin in ED) - will start Diflucan empirically for possible fungal infection - PRN Zofran for nausea, Percocet for pain - Blood cultures x 2  - ESR and CRP  OSA (obstructive sleep apnea): -CPAP  HTN: Not taking medications at home.  Blood pressure 138/88 -IV hydralazine prn  GERD: -Protonix  Anemia, iron deficiency: Hemoglobin stable, 11.8 -Follow-up with CBC  Asthma: Stable.  No wheezing or rhonchi on  auscultation. - Bronchodilators, Singulair, PRN Mucinex  DVT ppx: SQ Lovenox Code Status: Full code Family Communication: None at bed side.    Disposition Plan:  Anticipate discharge back to previous home environment Consults called:  none Admission status:   medical floor/obs   Date of Service 08/08/2018    Ivor Costa [facility] Pager [phone]  If 7PM-7AM, please contact night-coverage [email or web address] Password [email or web address] 08/08/2018, 1:56 AM

## 2018-08-08 DIAGNOSIS — Z96652 Presence of left artificial knee joint: Secondary | ICD-10-CM

## 2018-08-08 DIAGNOSIS — I10 Essential (primary) hypertension: Secondary | ICD-10-CM | POA: Diagnosis not present

## 2018-08-08 DIAGNOSIS — J4551 Severe persistent asthma with (acute) exacerbation: Secondary | ICD-10-CM | POA: Diagnosis not present

## 2018-08-08 DIAGNOSIS — Z91018 Allergy to other foods: Secondary | ICD-10-CM | POA: Diagnosis not present

## 2018-08-08 DIAGNOSIS — Z91048 Other nonmedicinal substance allergy status: Secondary | ICD-10-CM

## 2018-08-08 DIAGNOSIS — R52 Pain, unspecified: Secondary | ICD-10-CM | POA: Diagnosis not present

## 2018-08-08 DIAGNOSIS — R238 Other skin changes: Secondary | ICD-10-CM

## 2018-08-08 DIAGNOSIS — M1712 Unilateral primary osteoarthritis, left knee: Secondary | ICD-10-CM | POA: Diagnosis not present

## 2018-08-08 DIAGNOSIS — L0390 Cellulitis, unspecified: Secondary | ICD-10-CM | POA: Diagnosis not present

## 2018-08-08 DIAGNOSIS — J45901 Unspecified asthma with (acute) exacerbation: Secondary | ICD-10-CM | POA: Diagnosis not present

## 2018-08-08 DIAGNOSIS — J45909 Unspecified asthma, uncomplicated: Secondary | ICD-10-CM | POA: Diagnosis not present

## 2018-08-08 DIAGNOSIS — L03119 Cellulitis of unspecified part of limb: Secondary | ICD-10-CM | POA: Diagnosis present

## 2018-08-08 DIAGNOSIS — R269 Unspecified abnormalities of gait and mobility: Secondary | ICD-10-CM | POA: Diagnosis not present

## 2018-08-08 DIAGNOSIS — M7989 Other specified soft tissue disorders: Secondary | ICD-10-CM | POA: Diagnosis not present

## 2018-08-08 DIAGNOSIS — Z9989 Dependence on other enabling machines and devices: Secondary | ICD-10-CM | POA: Diagnosis not present

## 2018-08-08 DIAGNOSIS — K219 Gastro-esophageal reflux disease without esophagitis: Secondary | ICD-10-CM | POA: Diagnosis present

## 2018-08-08 DIAGNOSIS — L03116 Cellulitis of left lower limb: Principal | ICD-10-CM

## 2018-08-08 DIAGNOSIS — Z87891 Personal history of nicotine dependence: Secondary | ICD-10-CM | POA: Diagnosis not present

## 2018-08-08 DIAGNOSIS — G4733 Obstructive sleep apnea (adult) (pediatric): Secondary | ICD-10-CM | POA: Diagnosis not present

## 2018-08-08 DIAGNOSIS — R05 Cough: Secondary | ICD-10-CM | POA: Diagnosis not present

## 2018-08-08 DIAGNOSIS — N179 Acute kidney failure, unspecified: Secondary | ICD-10-CM | POA: Diagnosis not present

## 2018-08-08 DIAGNOSIS — Z96659 Presence of unspecified artificial knee joint: Secondary | ICD-10-CM | POA: Diagnosis not present

## 2018-08-08 DIAGNOSIS — Z91040 Latex allergy status: Secondary | ICD-10-CM

## 2018-08-08 DIAGNOSIS — Z7951 Long term (current) use of inhaled steroids: Secondary | ICD-10-CM | POA: Diagnosis not present

## 2018-08-08 DIAGNOSIS — D509 Iron deficiency anemia, unspecified: Secondary | ICD-10-CM | POA: Diagnosis present

## 2018-08-08 DIAGNOSIS — L02426 Furuncle of left lower limb: Secondary | ICD-10-CM | POA: Diagnosis present

## 2018-08-08 DIAGNOSIS — Z79899 Other long term (current) drug therapy: Secondary | ICD-10-CM | POA: Diagnosis not present

## 2018-08-08 DIAGNOSIS — J9690 Respiratory failure, unspecified, unspecified whether with hypoxia or hypercapnia: Secondary | ICD-10-CM | POA: Diagnosis not present

## 2018-08-08 DIAGNOSIS — Z471 Aftercare following joint replacement surgery: Secondary | ICD-10-CM | POA: Diagnosis not present

## 2018-08-08 DIAGNOSIS — J188 Other pneumonia, unspecified organism: Secondary | ICD-10-CM | POA: Diagnosis not present

## 2018-08-08 DIAGNOSIS — F1410 Cocaine abuse, uncomplicated: Secondary | ICD-10-CM | POA: Diagnosis present

## 2018-08-08 LAB — BASIC METABOLIC PANEL
ANION GAP: 6 (ref 5–15)
BUN: 11 mg/dL (ref 6–20)
CHLORIDE: 107 mmol/L (ref 98–111)
CO2: 27 mmol/L (ref 22–32)
Calcium: 8 mg/dL — ABNORMAL LOW (ref 8.9–10.3)
Creatinine, Ser: 0.84 mg/dL (ref 0.44–1.00)
GFR calc Af Amer: >60 mL/min (ref >60)
GFR calc non Af Amer: >60 mL/min (ref >60)
GLUCOSE: 90 mg/dL (ref 70–99)
POTASSIUM: 4.4 mmol/L (ref 3.5–5.1)
Sodium: 140 mmol/L (ref 135–145)

## 2018-08-08 LAB — CBC
HCT: 34.7 % — ABNORMAL LOW (ref 36.0–46.0)
Hemoglobin: 10.5 g/dL — ABNORMAL LOW (ref 12.0–15.0)
MCH: 25.4 pg — ABNORMAL LOW (ref 26.0–34.0)
MCHC: 30.3 g/dL (ref 30.0–36.0)
MCV: 83.8 fL (ref 80.0–100.0)
NRBC: 0 % (ref 0.0–0.2)
PLATELETS: 464 10*3/uL — AB (ref 150–400)
RBC: 4.14 MIL/uL (ref 3.87–5.11)
RDW: 20.2 % — AB (ref 11.5–15.5)
WBC: 6.1 10*3/uL (ref 4.0–10.5)

## 2018-08-08 LAB — RAPID URINE DRUG SCREEN, HOSP PERFORMED
Amphetamines: NOT DETECTED
Barbiturates: NOT DETECTED
Benzodiazepines: NOT DETECTED
COCAINE: NOT DETECTED
OPIATES: POSITIVE — AB
TETRAHYDROCANNABINOL: NOT DETECTED

## 2018-08-08 MED ORDER — LEVALBUTEROL HCL 0.63 MG/3ML IN NEBU: 0.6300 mg | INHALATION_SOLUTION | Freq: Four times a day (QID) | RESPIRATORY_TRACT | Status: DC | PRN

## 2018-08-08 MED ORDER — HYDROCODONE-ACETAMINOPHEN 5-325 MG PO TABS
1.0000 | ORAL_TABLET | Freq: Three times a day (TID) | ORAL | Status: DC | PRN
  Administered 2018-08-08 – 2018-08-09 (×2): 1 via ORAL
  Filled 2018-08-08 (×2): qty 1

## 2018-08-08 MED ORDER — ZOLPIDEM TARTRATE 5 MG PO TABS: 5.0000 mg | ORAL_TABLET | Freq: Every evening | ORAL | Status: DC | PRN

## 2018-08-08 MED ORDER — CEFAZOLIN SODIUM-DEXTROSE 1-4 GM/50ML-% IV SOLN
1.0000 g | Freq: Three times a day (TID) | INTRAVENOUS | Status: DC
  Administered 2018-08-08 – 2018-08-10 (×6): 1 g via INTRAVENOUS
  Filled 2018-08-08 (×8): qty 50

## 2018-08-08 MED ORDER — LEVALBUTEROL HCL 0.63 MG/3ML IN NEBU: 0.6300 mg | INHALATION_SOLUTION | Freq: Four times a day (QID) | RESPIRATORY_TRACT | Status: DC

## 2018-08-08 MED ORDER — HYDRALAZINE HCL 20 MG/ML IJ SOLN: 5.0000 mg | INTRAMUSCULAR | Status: DC | PRN

## 2018-08-08 NOTE — Consult Note (Signed)
[facility]    Date of Admission:  08/07/2018     Total days of antibiotics 1               Reason for Consult: Cellulitis    Referring Provider: Nelson Chimes Primary Care Provider: Hoy Register, MD   Assessment/Plan:  Ms. Panameno is a 52 y/o female with previous admission for sepsis related to cellulitis who has worsening redness and swelling of her left lower leg despite continued antibiotic treatment.  Rash located on her leg does not appear consistent with cellulitis with unclear origin. CT scan of the tibia from previous admission with edema and no fluid collection and no abscesses.  I would have expected cellulitis to improve with her previous regimen of cefuroxime and doxycycline as the most common bacteria are Streptococcus and Staphylococcus. She has no systemic symptoms currently.     1. Recommend stopping antibiotics. 2. Would benefit from a dermatology consult and possible biopsy for identification of the rash.    Principal Problem:   Cellulitis of left leg Active Problems:   OSA (obstructive sleep apnea)   Hypertension   GERD (gastroesophageal reflux disease)   Anemia, iron deficiency   Asthma   . budesonide  0.25 mg Nebulization TID  . enoxaparin (LOVENOX) injection  40 mg Subcutaneous Daily  . levalbuterol  0.63 mg Nebulization Q6H  . montelukast  10 mg Oral QHS     HPI: Kathleen Rodriguez is a 52 y.o. female with previous medical history relevant for substance abuse, hypertension, OSA and tobacco who was recently admitted to the hospital from 10/7-10/10 with cellulitis of the left lower extremity that was treated with vancomycin and ceftriaxone. She was changed to oral Cefuroxime and doxycycline for 10 days at discharge.   [name] returned to the ED on 10/15 with the chief complaint of left leg swelling and pain. Lower extremity dopplers were negative for DVT on 07/31/18. She reported adherence with the doxycycline and cefuroxime and  experienced worsening pain described as sharp and constant. In the ED she was afebrile with stable vital signs. Previous CT of the tibia/fibula with left total knee arthoplasty with mild lucency at the bone-cement interface with concern for potential loosening with diffuse infiltration throughout the subcutaneous fat likely related to edema through cellulitis and no discrete abscesses. Follow up x-ray from 10/15 with soft tissue swelling through the left lower extremity.    Review of Systems: Review of Systems  Constitutional: Negative for chills and fever.  Respiratory: Negative for cough, shortness of breath and wheezing.   Cardiovascular: Positive for leg swelling. Negative for chest pain.  Skin: Positive for rash.     Past Medical History:  Diagnosis Date  . Arthritis    "knees, lower back; legs, ankles" (01/27/2016)  . Asthma    followed by Dr. Craige Cotta  . CHF (congestive heart failure) ([facility]) 2016   "when I went into a coma"  . Cocaine abuse ([facility])   . 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 Dyspnea   . GERD (gastroesophageal reflux disease)   . Hypertension    "doctor took me off RX in 2016" (01/27/2016)  . Influenza B April 2014   Complicated by multi-organ failure  . OSA on CPAP "since " 03/20/2013  . Pneumonia 2016  . Required emergent intubation    asthma exacerbation in 2016  . Tobacco abuse   . Upper airway cough syndrome     Social History  Tobacco Use  . Smoking status: Former Smoker    Packs/day: 0.25    Years: 20.00    Pack years: 5.00    Types: Cigarettes    Last attempt to quit: 01/31/2013    Years since quitting: 5.5  . Smokeless tobacco: Never Used  Substance Use Topics  . 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 Drug use: No    Types: "Crack" cocaine    Comment: 01/27/2016 "clean from smoking crack since 2007"    Family History  Problem Relation Age of Onset  . Hypertension Mother   . HIV/AIDS Father     Allergies    Allergen Reactions  . Tomato Hives, Itching and Other (See Comments)    ALSO REACTS TO KETCHUP  . Latex Itching and Rash  . Wool Alcohol [Lanolin] Itching    OBJECTIVE: Blood pressure (!) 149/89, pulse 86, temperature 98.1 F (36.7 C), temperature source Oral, resp. rate 18, height 5\' 6"  (1.676 m), weight 110.2 kg, SpO2 97 %.  Physical Exam  Constitutional: She is oriented to person, place, and time. She appears well-developed and well-nourished. No distress.  Cardiovascular: Normal rate, regular rhythm, normal heart sounds and intact distal pulses.  Pulmonary/Chest: Effort normal and breath sounds normal.  Neurological: She is alert and oriented to person, place, and time.  Skin: Skin is warm and dry.  Left lower extremity with previous surgical scar noted from total knee replacement. Rash is raised, red and confluent which extends around her anterior tibia. This is tender to the touch and mildly warm. There is a large bullae located on the anterior surface. Distal pulses and sensation are intact.   Psychiatric: She has a normal mood and affect. Her behavior is normal. Judgment and thought content normal.        Lab Results Lab Results  Component Value Date   WBC 6.1 08/08/2018   HGB 10.5 (L) 08/08/2018   HCT 34.7 (L) 08/08/2018   MCV 83.8 08/08/2018   PLT 464 (H) 08/08/2018    Lab Results  Component Value Date   CREATININE 0.84 08/08/2018   BUN 11 08/08/2018   NA 140 08/08/2018   K 4.4 08/08/2018   CL 107 08/08/2018   CO2 27 08/08/2018    Lab Results  Component Value Date   ALT 38 08/07/2018   AST 28 08/07/2018   ALKPHOS 48 08/07/2018   BILITOT 0.4 08/07/2018     Microbiology: Recent Results (from the past 240 hour(s))  Blood Culture (routine x 2)     Status: None   Collection Time: 07/30/18  7:51 PM  Result Value Ref Range Status   Specimen Description BLOOD RIGHT ANTECUBITAL  Final   Special Requests   Final    BOTTLES DRAWN AEROBIC AND ANAEROBIC Blood  Culture adequate volume   Culture   Final    NO GROWTH 5 DAYS Performed at [facility], [address], [place], [place] [address]    Report Status 08/04/2018 FINAL  Final  Blood Culture (routine x 2)     Status: None   Collection Time: 07/30/18  8:07 PM  Result Value Ref Range Status   Specimen Description BLOOD RIGHT WRIST  Final   Special Requests   Final    BOTTLES DRAWN AEROBIC AND ANAEROBIC Blood Culture adequate volume   Culture   Final    NO GROWTH 5 DAYS Performed at [facility], [address]  [place], [place] [address]    Report Status 08/04/2018 FINAL  Final  Urine culture     Status: None   Collection Time: 07/30/18 10:39 PM  Result Value Ref Range Status   Specimen Description URINE, CATHETERIZED  Final   Special Requests NONE  Final   Culture   Final    NO GROWTH Performed at [facility], [address] [place], [place] [address]    Report Status 08/01/2018 FINAL  Final  MRSA PCR Screening     Status: None   Collection Time: 08/02/18 11:25 AM  Result Value Ref Range Status   MRSA by PCR NEGATIVE NEGATIVE Final    Comment:        The GeneXpert MRSA Assay (FDA approved for NASAL specimens only), is one component of a comprehensive MRSA colonization surveillance program. It is not intended to diagnose MRSA infection nor to guide or monitor treatment for MRSA infections. Performed at [facility], [address] [place], [place] [address]   Blood culture (routine x 2)     Status: None (Preliminary result)   Collection Time: 08/07/18  1:45 PM  Result Value Ref Range Status   Specimen Description BLOOD LEFT ARM  Final   Special Requests   Final    BOTTLES DRAWN AEROBIC AND ANAEROBIC Blood Culture adequate volume   Culture   Final    NO GROWTH < 24 HOURS Performed at [facility], [address] [place], [place] [address]    Report Status PENDING  Incomplete  Blood culture (routine x 2)     Status: None (Preliminary  result)   Collection Time: 08/07/18  6:09 PM  Result Value Ref Range Status   Specimen Description BLOOD RIGHT HAND  Final   Special Requests   Final    BOTTLES DRAWN AEROBIC AND ANAEROBIC Blood Culture adequate volume   Culture   Final    NO GROWTH < 24 HOURS Performed at [facility], [address], [place], [place] [address]    Report Status PENDING  Incomplete     Marcos Eke, NP [facility] [phone] Pager  08/08/2018  11:59 AM

## 2018-08-08 NOTE — Progress Notes (Signed)
Patient admit to room 13 from [facility]. Patient alert oriented x 4, c/o left leg pain 9/10, VS stable at this time. Pain pill given to patient. Patient in bed locked at low position. Bedside table, call light and telephone within patient reach. Will continue to monitor patient.

## 2018-08-08 NOTE — Progress Notes (Signed)
Patient refuses all nebulizer treatments. Stated she will call if she needs them

## 2018-08-08 NOTE — Progress Notes (Signed)
PROGRESS NOTE    MAKYNZEE Rodriguez  AVW:[id number] DOB: [birth year] DOA: 08/07/2018 PCP: Hoy Register, MD   Brief Narrative:  52 year old with history of essential hypertension, asthma, GERD, cocaine use, obstructive sleep apnea, tobacco use came to the hospital with complaints of left lower extremity pain and swelling.  Patient was admitted to this hospital about a week ago for asthma exacerbation of left lower extremity cellulitis.  Eventually she was discharged on doxycycline and Ceftin.  At that time her lower extreme Dopplers were negative.  She returns back to the ER due to worsening of this left lower extremity swelling pain and erythema.  There is also a small boil in that area as well.   Assessment & Plan:   Principal Problem:   Cellulitis of left leg Active Problems:   OSA (obstructive sleep apnea)   Hypertension   GERD (gastroesophageal reflux disease)   Anemia, iron deficiency   Asthma  Left lower extremity cellulitis with central bulla -Patient failed outpatient treatment with oral Ceftin and doxycycline.  Will need aggressive inpatient treatment with IV antibiotics.  IV Rocephin, vancomycin and clindamycin given earlier. -I discussed the case with infectious disease, Dr. Ilsa Iha who recommends using IV Ancef.  We will see the patient in consultation -Pain control, supportive care -Follow-up cultures -CRP is within normal limits, sed rate 35.  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  Continue supportive care  Obstructive sleep apnea -Bedtime CPAP  Protonix -PPI  Iron deficiency anemia -No previous iron studies available at this time.  Asthma Continue home bronchodilators, Pulmicort, Singulair  DVT prophylaxis: Lovenox Code Status: Full code Family Communication: None at bedside Disposition Plan: Maintain in hospital stay for IV antibiotics.  Patient has failed oral antibiotic treatment  outpatient.  Consultants:   Infectious disease  Procedures:   None  Antimicrobials:   Vancomycin, Rocephin and clindamycin discontinued 10/16  Ancef 10/16>   Subjective: Patient still reports of left lower extremity pain and worsening swelling.  She states it appears more erythematous with area of bullous now.  Review of Systems Otherwise negative except as per HPI, including: General: Denies fever, chills, night sweats or unintended weight loss. Resp: Denies cough, wheezing, shortness of breath. Cardiac: Denies chest pain, palpitations, orthopnea, paroxysmal nocturnal dyspnea. GI: Denies abdominal pain, nausea, vomiting, diarrhea or constipation GU: Denies dysuria, frequency, hesitancy or incontinence MS: Denies muscle aches, joint pain or swelling Neuro: Denies headache, neurologic deficits (focal weakness, numbness, tingling), abnormal gait Psych: Denies anxiety, depression, SI/HI/AVH Skin: Erythema and swelling of the left lower extremity ID: Denies sick contacts, exotic exposures, travel  Objective: Vitals:   08/07/18 2200 08/07/18 2230 08/07/18 2300 08/08/18 0611  BP: (!) 142/78 120/82 (!) 169/94 (!) 149/89  Pulse: 90 90 90 86  Resp: 16 20 18 18   Temp:   97.8 F (36.6 C) 98.1 F (36.7 C)  TempSrc:   Oral Oral  SpO2: 97% 99% 97% 97%  Weight:      Height:        Intake/Output Summary (Last 24 hours) at 08/08/2018 1125 Last data filed at 08/08/2018 0300 Gross per 24 hour  Intake 146.67 ml  Output -  Net 146.67 ml   Filed Weights   08/07/18 1724  Weight: 110.2 kg    Examination:  General exam: Appears calm and comfortable  Respiratory system: Clear to auscultation. Respiratory effort normal. Cardiovascular system: S1 & S2 heard, RRR. No JVD, murmurs, rubs, gallops or clicks. No  pedal edema. Gastrointestinal system: Abdomen is nondistended, soft and nontender. No organomegaly or masses felt. Normal bowel sounds heard. Central nervous system: Alert  and oriented. No focal neurological deficits. Extremities: Symmetric 5 x 5 power. Skin: Erythema and swelling of the left lower extremity, with a raised area and central bulla which appears to be fluid-filled.  Area is also slightly tender to touch. Psychiatry: Judgement and insight appear normal. Mood & affect appropriate.     Data Reviewed:   CBC: Recent Labs  Lab 08/02/18 0258 08/07/18 1808 08/08/18 0506  WBC 14.5* 6.4 6.1  NEUTROABS 12.6* 2.4  --   HGB 11.1* 11.8* 10.5*  HCT 35.6* 39.7 34.7*  MCV 82.2 85.0 83.8  PLT 245 540* 464*   Basic Metabolic Panel: Recent Labs  Lab 08/02/18 0258 08/07/18 1808 08/08/18 0506  NA 139 139 140  K 4.6 4.3 4.4  CL 103 107 107  CO2 28 26 27   GLUCOSE 125* 116* 90  BUN 18 9 11   CREATININE 0.79 0.93 0.84  CALCIUM 8.4* 8.4* 8.0*  MG 1.9  --   --    GFR: Estimated Creatinine Clearance: 98.6 mL/min (by C-G formula based on SCr of 0.84 mg/dL). Liver Function Tests: Recent Labs  Lab 08/02/18 0258 08/07/18 1808  AST 62* 28  ALT 49* 38  ALKPHOS 48 48  BILITOT 0.1* 0.4  PROT 6.1* 6.2*  ALBUMIN 2.3* 2.8*   No results for input(s): LIPASE, AMYLASE in the last 168 hours. No results for input(s): AMMONIA in the last 168 hours. Coagulation Profile: No results for input(s): INR, PROTIME in the last 168 hours. Cardiac Enzymes: No results for input(s): CKTOTAL, CKMB, CKMBINDEX, TROPONINI in the last 168 hours. BNP (last 3 results) No results for input(s): PROBNP in the last 8760 hours. HbA1C: No results for input(s): HGBA1C in the last 72 hours. CBG: No results for input(s): GLUCAP in the last 168 hours. Lipid Profile: No results for input(s): CHOL, HDL, LDLCALC, TRIG, CHOLHDL, LDLDIRECT in the last 72 hours. Thyroid Function Tests: No results for input(s): TSH, T4TOTAL, FREET4, T3FREE, THYROIDAB in the last 72 hours. Anemia Panel: No results for input(s): VITAMINB12, FOLATE, FERRITIN, TIBC, IRON, RETICCTPCT in the last 72  hours. Sepsis Labs: Recent Labs  Lab 08/07/18 1822  LATICACIDVEN 0.91    Recent Results (from the past 240 hour(s))  Blood Culture (routine x 2)     Status: None   Collection Time: 07/30/18  7:51 PM  Result Value Ref Range Status   Specimen Description BLOOD RIGHT ANTECUBITAL  Final   Special Requests   Final    BOTTLES DRAWN AEROBIC AND ANAEROBIC Blood Culture adequate volume   Culture   Final    NO GROWTH 5 DAYS Performed at [facility], [address], [place], [place] [address]    Report Status 08/04/2018 FINAL  Final  Blood Culture (routine x 2)     Status: None   Collection Time: 07/30/18  8:07 PM  Result Value Ref Range Status   Specimen Description BLOOD RIGHT WRIST  Final   Special Requests   Final    BOTTLES DRAWN AEROBIC AND ANAEROBIC Blood Culture adequate volume   Culture   Final    NO GROWTH 5 DAYS Performed at [facility], [address], [place], [place] [address]    Report Status 08/04/2018 FINAL  Final  Urine culture     Status: None   Collection Time: 07/30/18 10:39 PM  Result Value Ref  Range Status   Specimen Description URINE, CATHETERIZED  Final   Special Requests NONE  Final   Culture   Final    NO GROWTH Performed at [facility], [address], [place], [place] [address]    Report Status 08/01/2018 FINAL  Final  MRSA PCR Screening     Status: None   Collection Time: 08/02/18 11:25 AM  Result Value Ref Range Status   MRSA by PCR NEGATIVE NEGATIVE Final    Comment:        The GeneXpert MRSA Assay (FDA approved for NASAL specimens only), is one component of a comprehensive MRSA colonization surveillance program. It is not intended to diagnose MRSA infection nor to guide or monitor treatment for MRSA infections. Performed at [facility], [address], [place], [place] [address]   Blood culture (routine x 2)     Status: None (Preliminary result)   Collection Time: 08/07/18  1:45 PM  Result Value Ref Range  Status   Specimen Description BLOOD LEFT ARM  Final   Special Requests   Final    BOTTLES DRAWN AEROBIC AND ANAEROBIC Blood Culture adequate volume   Culture   Final    NO GROWTH < 24 HOURS Performed at [facility], [address], [place], [place] [address]    Report Status PENDING  Incomplete  Blood culture (routine x 2)     Status: None (Preliminary result)   Collection Time: 08/07/18  6:09 PM  Result Value Ref Range Status   Specimen Description BLOOD RIGHT HAND  Final   Special Requests   Final    BOTTLES DRAWN AEROBIC AND ANAEROBIC Blood Culture adequate volume   Culture   Final    NO GROWTH < 24 HOURS Performed at [facility], [address] [place], [place] [address]    Report Status PENDING  Incomplete         Radiology Studies: Dg Tibia/fibula Left  Result Date: 08/07/2018 CLINICAL DATA:  Initial evaluation for left lower extremity cellulitis, swelling. EXAM: LEFT TIBIA AND FIBULA - 2 VIEW COMPARISON:  Prior CT from 07/31/2018. FINDINGS: No acute fracture dislocation. 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 Mild diffuse soft tissue swelling about the left leg. No soft tissue emphysema. No radiopaque foreign body. No osseous erosion to suggest osteomyelitis. IMPRESSION: 1. Mild diffuse soft tissue swelling throughout the left lower extremity. No radiopaque foreign body or soft tissue emphysema. 2. No acute osseous abnormality. No radiographic evidence for osteomyelitis. 3. 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 Electronically Signed   By: Rise Mu M.D.   On: 08/07/2018 20:35        Scheduled Meds: . budesonide  0.25 mg Nebulization TID  . enoxaparin (LOVENOX) injection  40 mg Subcutaneous Daily  . montelukast  10 mg Oral QHS   Continuous Infusions: .  ceFAZolin (ANCEF) IV       LOS: 0 days   Time spent= 32 mins    Ankit Joline Maxcy, MD [facility] Pager [phone]   If  7PM-7AM, please contact night-coverage [email or web address] Password [email or web address] 08/08/2018, 11:25 AM

## 2018-08-09 ENCOUNTER — Inpatient Hospital Stay (HOSPITAL_COMMUNITY): Payer: Medicaid Other

## 2018-08-09 DIAGNOSIS — L0390 Cellulitis, unspecified: Secondary | ICD-10-CM

## 2018-08-09 DIAGNOSIS — R52 Pain, unspecified: Secondary | ICD-10-CM

## 2018-08-09 DIAGNOSIS — M7989 Other specified soft tissue disorders: Secondary | ICD-10-CM

## 2018-08-09 LAB — C-REACTIVE PROTEIN: CRP: <0.8 mg/dL (ref <1.0)

## 2018-08-09 MED ORDER — OXYCODONE-ACETAMINOPHEN 5-325 MG PO TABS
1.0000 | ORAL_TABLET | Freq: Four times a day (QID) | ORAL | Status: DC | PRN
  Administered 2018-08-09 – 2018-08-10 (×5): 2 via ORAL
  Filled 2018-08-09 (×5): qty 2

## 2018-08-09 MED ORDER — BUDESONIDE 0.25 MG/2ML IN SUSP
0.2500 mg | Freq: Two times a day (BID) | RESPIRATORY_TRACT | Status: DC
  Filled 2018-08-09 (×2): qty 2

## 2018-08-09 NOTE — Progress Notes (Signed)
VASCULAR LAB PRELIMINARY  PRELIMINARY  PRELIMINARY  PRELIMINARY  Bilateral lower extremity venous duplex completed.    Preliminary report:  There is no obvious evidence of DVT or SVT noted in the bilateral lower extremities.  No change since prior study done 07/31/18.  Shi Blankenship, RVT 08/09/2018, 2:49 PM

## 2018-08-09 NOTE — Progress Notes (Signed)
Patient refused CPAP.

## 2018-08-09 NOTE — Progress Notes (Signed)
Patient refused CPAP HS tonight 

## 2018-08-09 NOTE — Progress Notes (Addendum)
PROGRESS NOTE    Kathleen KRUMMEL  ZOX:[id number] DOB: Nov 27, 1965 DOA: 08/07/2018 PCP: Hoy Register, MD   Brief Narrative:  52 year old with history of essential hypertension, asthma, GERD, cocaine use, obstructive sleep apnea, tobacco use came to the hospital with complaints of left lower extremity pain and swelling.  Patient was admitted to this hospital about a week ago for asthma exacerbation of left lower extremity cellulitis.  Eventually she was discharged on doxycycline and Ceftin.  At that time her lower extreme Dopplers were negative.  She returns back to the ER due to worsening of this left lower extremity swelling pain and erythema.  There is also a small boil in that area as well.  ROS - Patient in bed, appears comfortable, denies any headache, no fever, no chest pain or pressure, no shortness of breath , no abdominal pain. No focal weakness.  Does have left leg pain.   Assessment & Plan:   Left lower extremity bullous rash.  No Fever or leukocytosis, has been treated with oral Ceftin and doxycycline outpatient, doubt this is an infection, this could be morphea with bullous lesions, check ANA and CRP.  If remains afebrile discharge tomorrow with outpatient Derm follow-up.  Cultures negative so far.  No mucosal involvement yet, closely monitor and rule out bullous pemphigoid clinically.  Continue supportive care.  Check lower extremity venous duplex as well.   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  Continue supportive care  Obstructive sleep apnea -Bedtime CPAP  Protonix -PPI  Iron deficiency anemia -No previous iron studies available at this time.  Asthma Continue home bronchodilators, Pulmicort, Singulair    DVT prophylaxis: Lovenox Code Status: Full code Family Communication: None at bedside Disposition Plan: Maintain in hospital stay for IV antibiotics.  Patient has failed oral antibiotic  treatment outpatient.  Consultants:   Infectious disease  Procedures:   None  Antimicrobials:   Vancomycin, Rocephin and clindamycin discontinued 10/16  Ancef 10/16>   Objective: Vitals:   08/08/18 1225 08/08/18 1554 08/08/18 2043 08/09/18 0401  BP: (!) 145/82 117/73 135/79 132/73  Pulse: 94  86 93  Resp: 18 18 18 17   Temp: 99 F (37.2 C) 98.2 F (36.8 C) 97.7 F (36.5 C) 98.1 F (36.7 C)  TempSrc: Oral Oral    SpO2: 95% 99% 98% 97%  Weight:      Height:        Intake/Output Summary (Last 24 hours) at 08/09/2018 1115 Last data filed at 08/08/2018 2245 Gross per 24 hour  Intake 480 ml  Output -  Net 480 ml   Filed Weights   08/07/18 1724  Weight: 110.2 kg    Examination:  Awake Alert, Oriented X 3, No new F.N deficits, Normal affect [place].AT,PERRAL Supple Neck,No JVD, No cervical lymphadenopathy appriciated.  Symmetrical Chest wall movement, Good air movement bilaterally, CTAB RRR,No Gallops, Rubs or new Murmurs, No Parasternal Heave +ve B.Sounds, Abd Soft, No tenderness, No organomegaly appriciated, No rebound - guarding or rigidity. No Cyanosis, Clubbing or edema, left leg well-demarcated bullous lesion noted between left knee and left ankle   Data Reviewed:   CBC: Recent Labs  Lab 08/07/18 1808 08/08/18 0506  WBC 6.4 6.1  NEUTROABS 2.4  --   HGB 11.8* 10.5*  HCT 39.7 34.7*  MCV 85.0 83.8  PLT 540* 464*   Basic Metabolic Panel: Recent Labs  Lab 08/07/18 1808 08/08/18 0506  NA 139 140  K 4.3 4.4  CL  107 107  CO2 26 27  GLUCOSE 116* 90  BUN 9 11  CREATININE 0.93 0.84  CALCIUM 8.4* 8.0*   GFR: Estimated Creatinine Clearance: 97.5 mL/min (by C-G formula based on SCr of 0.84 mg/dL). Liver Function Tests: Recent Labs  Lab 08/07/18 1808  AST 28  ALT 38  ALKPHOS 48  BILITOT 0.4  PROT 6.2*  ALBUMIN 2.8*   No results for input(s): LIPASE, AMYLASE in the last 168 hours. No results for input(s): AMMONIA in the last 168  hours. Coagulation Profile: No results for input(s): INR, PROTIME in the last 168 hours. Cardiac Enzymes: No results for input(s): CKTOTAL, CKMB, CKMBINDEX, TROPONINI in the last 168 hours. BNP (last 3 results) No results for input(s): PROBNP in the last 8760 hours. HbA1C: No results for input(s): HGBA1C in the last 72 hours. CBG: No results for input(s): GLUCAP in the last 168 hours. Lipid Profile: No results for input(s): CHOL, HDL, LDLCALC, TRIG, CHOLHDL, LDLDIRECT in the last 72 hours. Thyroid Function Tests: No results for input(s): TSH, T4TOTAL, FREET4, T3FREE, THYROIDAB in the last 72 hours. Anemia Panel: No results for input(s): VITAMINB12, FOLATE, FERRITIN, TIBC, IRON, RETICCTPCT in the last 72 hours. Sepsis Labs: Recent Labs  Lab 08/07/18 1822  LATICACIDVEN 0.91    Recent Results (from the past 240 hour(s))  Blood Culture (routine x 2)     Status: None   Collection Time: 07/30/18  7:51 PM  Result Value Ref Range Status   Specimen Description BLOOD RIGHT ANTECUBITAL  Final   Special Requests   Final    BOTTLES DRAWN AEROBIC AND ANAEROBIC Blood Culture adequate volume   Culture   Final    NO GROWTH 5 DAYS Performed at [facility], [address] [place], [place] [address]    Report Status 08/04/2018 FINAL  Final  Blood Culture (routine x 2)     Status: None   Collection Time: 07/30/18  8:07 PM  Result Value Ref Range Status   Specimen Description BLOOD RIGHT WRIST  Final   Special Requests   Final    BOTTLES DRAWN AEROBIC AND ANAEROBIC Blood Culture adequate volume   Culture   Final    NO GROWTH 5 DAYS Performed at [facility], [address] [place], [place] [address]    Report Status 08/04/2018 FINAL  Final  Urine culture     Status: None   Collection Time: 07/30/18 10:39 PM  Result Value Ref Range Status   Specimen Description URINE, CATHETERIZED  Final   Special Requests NONE  Final   Culture   Final    NO GROWTH Performed at [facility], [address], [place], [place] [address]    Report Status 08/01/2018 FINAL  Final  MRSA PCR Screening     Status: None   Collection Time: 08/02/18 11:25 AM  Result Value Ref Range Status   MRSA by PCR NEGATIVE NEGATIVE Final    Comment:        The GeneXpert MRSA Assay (FDA approved for NASAL specimens only), is one component of a comprehensive MRSA colonization surveillance program. It is not intended to diagnose MRSA infection nor to guide or monitor treatment for MRSA infections. Performed at [facility], [address] [place], [place] [address]   Blood culture (routine x 2)     Status: None (Preliminary result)   Collection Time: 08/07/18  1:45 PM  Result Value Ref Range Status   Specimen Description  BLOOD LEFT ARM  Final   Special Requests   Final    BOTTLES DRAWN AEROBIC AND ANAEROBIC Blood Culture adequate volume   Culture   Final    NO GROWTH 2 DAYS Performed at [facility], [address], [place], [place] [address]    Report Status PENDING  Incomplete  Blood culture (routine x 2)     Status: None (Preliminary result)   Collection Time: 08/07/18  6:09 PM  Result Value Ref Range Status   Specimen Description BLOOD RIGHT HAND  Final   Special Requests   Final    BOTTLES DRAWN AEROBIC AND ANAEROBIC Blood Culture adequate volume   Culture   Final    NO GROWTH 2 DAYS Performed at [facility], [address] [place], [place] [address]    Report Status PENDING  Incomplete         Radiology Studies: Dg Tibia/fibula Left  Result Date: 08/07/2018 CLINICAL DATA:  Initial evaluation for left lower extremity cellulitis, swelling. EXAM: LEFT TIBIA AND FIBULA - 2 VIEW COMPARISON:  Prior CT from 07/31/2018. FINDINGS: No acute fracture dislocation. 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 Mild diffuse soft tissue swelling about the left leg. No soft tissue emphysema. No radiopaque foreign body. No osseous erosion to  suggest osteomyelitis. IMPRESSION: 1. Mild diffuse soft tissue swelling throughout the left lower extremity. No radiopaque foreign body or soft tissue emphysema. 2. No acute osseous abnormality. No radiographic evidence for osteomyelitis. 3. 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 Electronically Signed   By: Rise Mu M.D.   On: 08/07/2018 20:35        Scheduled Meds: . budesonide  0.25 mg Nebulization TID  . enoxaparin (LOVENOX) injection  40 mg Subcutaneous Daily  . montelukast  10 mg Oral QHS   Continuous Infusions: .  ceFAZolin (ANCEF) IV 1 g (08/09/18 0615)     LOS: 1 day   Time spent= 35 mins  Signature  Susa Raring M.D on 08/09/2018 at 11:15 AM   -  To page go to [email or web address] - password [email or web address]

## 2018-08-10 DIAGNOSIS — M79605 Pain in left leg: Secondary | ICD-10-CM

## 2018-08-10 DIAGNOSIS — L26 Exfoliative dermatitis: Secondary | ICD-10-CM

## 2018-08-10 DIAGNOSIS — L538 Other specified erythematous conditions: Secondary | ICD-10-CM

## 2018-08-10 LAB — ANTINUCLEAR ANTIBODIES, IFA: ANA Ab, IFA: NEGATIVE

## 2018-08-10 MED ORDER — DOXYCYCLINE HYCLATE 100 MG PO TABS: 100.0000 mg | ORAL_TABLET | Freq: Two times a day (BID) | ORAL | 0 refills | Status: AC

## 2018-08-10 NOTE — Discharge Summary (Addendum)
Kathleen Rodriguez:[id number] DOB: May 13, 1966 DOA: 08/07/2018  PCP: Hoy Register, MD  Admit date: 08/07/2018  Discharge date: 08/10/2018  Admitted From: Home   Disposition:  Home   Recommendations for Outpatient Follow-up:   Follow up with PCP in 1-2 weeks  PCP Please obtain BMP/CBC, 2 view CXR in 1week,  (see Discharge instructions)   PCP Please follow up on the following pending results: ANA results which are pending, scheduled with outpatient dermatology follow-up   Home Health: None   Equipment/Devices: None  Consultations: ID Discharge Condition: Stable   CODE STATUS: Full   Diet Recommendation: Heart Healthy     Chief Complaint  Patient presents with  . Cellulitis     Brief history of present illness from the day of admission and additional interim summary    52 year old with history of essential hypertension, asthma, GERD, cocaine use, obstructive sleep apnea, tobacco use came to the hospital with complaints of left lower extremity pain and swelling.  Patient was admitted to this hospital about a week ago for asthma exacerbation of left lower extremity cellulitis.  Eventually she was discharged on doxycycline and Ceftin.  At that time her lower extreme Dopplers were negative.  She returns back to the ER due to worsening of this left lower extremity swelling pain and erythema.  There is also a small boil in that area as well.                                                                  Hospital Course    Left lower extremity bullous rash.  No Fever or leukocytosis, has been treated with oral Ceftin and doxycycline outpatient, doubt this is an infection, this could be morphea with bullous lesions or simply bullous dermatitis without any mucosal involvement, ERP was less than 1, ANA is pending.   Lower extremity venous duplex unremarkable, she was seen by ID who agrees this is likely not dermatitis.  She will continue oral doxycycline for a week and I have requested her to follow-up with her dermatologist within a week for a biopsy and definitive diagnosis and treatment.  Requested to keep her left leg clean and dry at all times.   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  Continue supportive care  Obstructive sleep apnea -Bedtime CPAP  Protonix -PPI  Iron deficiency anemia -No previous iron studies available at this time.  CP to monitor and follow as age-appropriate.  Asthma Continue home bronchodilators, Pulmicort, Singulair   Discharge diagnosis     Principal Problem:   Cellulitis of left leg Active Problems:   OSA (obstructive sleep apnea)   Hypertension   GERD (gastroesophageal reflux disease)   Anemia, iron deficiency   Asthma   Cellulitis of foot  Left leg pain   Erythroderma, maculopapular    Discharge instructions    Discharge Instructions    Diet - low sodium heart healthy   Complete by:  As directed    Discharge instructions   Complete by:  As directed    Follow with Primary MD Hoy Register, MD and with your dermatologist in 7 days   Get CBC, CMP  checked  by Primary MD n 5-7 days    Activity: As tolerated with Full fall precautions use walker/cane & assistance as needed  Keep your left leg clean and dry at all times.  Disposition Home    Diet: Heart Healthy   Special Instructions: If you have smoked or chewed Tobacco  in the last 2 yrs please stop smoking, stop any regular Alcohol  and or any Recreational drug use.  On your next visit with your primary care physician please Get Medicines reviewed and adjusted.  Please request your Prim.MD to go over all Hospital Tests and Procedure/Radiological results at the follow up, please get all Hospital records sent to your  Prim MD by signing hospital release before you go home.  If you experience worsening of your admission symptoms, develop shortness of breath, life threatening emergency, suicidal or homicidal thoughts you must seek medical attention immediately by calling 911 or calling your MD immediately  if symptoms less severe.  You Must read complete instructions/literature along with all the possible adverse reactions/side effects for all the Medicines you take and that have been prescribed to you. Take any new Medicines after you have completely understood and accpet all the possible adverse reactions/side effects.   Increase activity slowly   Complete by:  As directed       Discharge Medications   Allergies as of 08/10/2018      Reactions   Tomato Hives, Itching, Other (See Comments)   ALSO REACTS TO KETCHUP   Latex Itching, Rash   Wool Alcohol [lanolin] Itching      Medication List    STOP taking these medications   cefUROXime 500 MG tablet Commonly known as:  CEFTIN   HYDROcodone-acetaminophen 5-325 MG tablet Commonly known as:  NORCO/VICODIN     TAKE these medications   acetaminophen 500 MG tablet Commonly known as:  TYLENOL Take 500 mg by mouth every 6 (six) hours as needed for mild pain.   AEROCHAMBER MV inhaler Use as instructed   albuterol (2.5 MG/3ML) 0.083% nebulizer solution Commonly known as:  PROVENTIL Take 3 mLs (2.5 mg total) by nebulization every 6 (six) hours as needed for wheezing or shortness of breath.   albuterol 108 (90 Base) MCG/ACT inhaler Commonly known as:  PROVENTIL HFA;VENTOLIN HFA Inhale 2 puffs into the lungs every 6 (six) hours as needed for wheezing or shortness of breath.   budesonide 0.25 MG/2ML nebulizer solution Commonly known as:  PULMICORT Take 2 mLs (0.25 mg total) by nebulization 2 (two) times daily. What changed:  when to take this   doxycycline 100 MG tablet Commonly known as:  VIBRA-TABS Take 1 tablet (100 mg total) by mouth 2  (two) times daily for 7 days.   fluticasone 50 MCG/ACT nasal spray Commonly known as:  FLONASE Place 1 spray into both nostrils daily. What changed:    when to take this  reasons to take this   guaiFENesin 600 MG 12 hr tablet Commonly known as:  MUCINEX Take 1 tablet (600 mg total) by mouth 2 (two) times daily. What changed:    how  much to take  when to take this  reasons to take this   hydrOXYzine 25 MG tablet Commonly known as:  ATARAX/VISTARIL Take 1 tablet (25 mg total) by mouth 3 (three) times daily as needed for itching.   methocarbamol 500 MG tablet Commonly known as:  ROBAXIN Take 1 tablet (500 mg total) by mouth every 8 (eight) hours as needed for muscle spasms.   montelukast 10 MG tablet Commonly known as:  SINGULAIR Take 1 tablet (10 mg total) by mouth at bedtime.   pantoprazole 40 MG tablet Commonly known as:  PROTONIX Take 40 mg by mouth daily as needed (acid reflux).            Durable Medical Equipment  (From admission, onward)         Start     Ordered   08/10/18 0914  For home use only DME 4 wheeled rolling walker with seat  Once    Question:  Patient needs a walker to treat with the following condition  Answer:  Weakness   08/10/18 0913          Follow-up Information    Hoy Register, MD. Call on 08/13/2018.   Specialty:  Family Medicine Why:   Call on Monday 08/13/2018 at  8:30 am to arranged hopital follow up appointment with PCP. Also  f/u with your dermatologist within a week Contact information: [address] [place] [address] [phone]        Dermatology, [place]. Schedule an appointment as soon as possible for a visit in 4 day(s).   Why:  Bullous dermatitis, please see ASAP Contact information: [address] [facility] [address] [phone]           Major procedures and Radiology Reports - PLEASE review detailed and final reports thoroughly  -     Lower extremity venous duplex.   No DVT or SVT.   Ct Abdomen Pelvis Wo Contrast  Result Date: 07/31/2018 CLINICAL DATA:  Nausea and diarrhea for 4 days. Dizziness when standing. EXAM: CT ABDOMEN AND PELVIS WITHOUT CONTRAST TECHNIQUE: Multidetector CT imaging of the abdomen and pelvis was performed following the standard protocol without IV contrast. COMPARISON:  None. FINDINGS: Lower chest: Lung bases are clear. Hepatobiliary: No focal liver abnormality is seen. No gallstones, gallbladder wall thickening, or biliary dilatation. Pancreas: Unremarkable. No pancreatic ductal dilatation or surrounding inflammatory changes. Spleen: Normal in size without focal abnormality. Adrenals/Urinary Tract: Adrenal glands are unremarkable. Kidneys are normal, without renal calculi, focal lesion, or hydronephrosis. Bladder is unremarkable. Stomach/Bowel: Stomach, small bowel, and colon are not abnormally distended. Scattered diverticula throughout the colon. No inflammatory changes. Appendix is normal. Vascular/Lymphatic: No significant vascular findings are present. No enlarged abdominal or pelvic lymph nodes. Reproductive: Uterus and bilateral adnexa are unremarkable. Other: No free air or free fluid in the abdomen. There is a moderately prominent lymph node in the left groin region with surrounding soft tissue infiltration likely representing inflammatory or infected node. Minimal fat containing left inguinal hernia. Musculoskeletal: Mild degenerative changes in the spine. No destructive bone lesions. IMPRESSION: 1. Prominent lymph node in the left groin region with surrounding soft tissue infiltration likely representing inflammatory or infected node. 2. No evidence of bowel obstruction or inflammation. Diverticulosis of the colon without evidence of diverticulitis. Electronically Signed   By: Burman Nieves M.D.   On: 07/31/2018 00:27   Dg Chest 2 View  Result Date: 07/30/2018 CLINICAL DATA:  Cough and shortness of breath EXAM: CHEST -  2 VIEW  COMPARISON:  Chest radiograph November 30, 2017 and chest CT Mar 12, 2018 FINDINGS: There is no edema or consolidation. Heart size and pulmonary vascularity are normal. No adenopathy. No pneumothorax. No bone lesions. IMPRESSION: No edema or consolidation. Electronically Signed   By: Bretta Bang III M.D.   On: 07/30/2018 20:55   Dg Tibia/fibula Left  Result Date: 08/07/2018 CLINICAL DATA:  Initial evaluation for left lower extremity cellulitis, swelling. EXAM: LEFT TIBIA AND FIBULA - 2 VIEW COMPARISON:  Prior CT from 07/31/2018. FINDINGS: No acute fracture dislocation. 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 Mild diffuse soft tissue swelling about the left leg. No soft tissue emphysema. No radiopaque foreign body. No osseous erosion to suggest osteomyelitis. IMPRESSION: 1. Mild diffuse soft tissue swelling throughout the left lower extremity. No radiopaque foreign body or soft tissue emphysema. 2. No acute osseous abnormality. No radiographic evidence for osteomyelitis. 3. 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 Electronically Signed   By: Rise Mu M.D.   On: 08/07/2018 20:35   Ct Tibia Fibula Left Wo Contrast  Result Date: 07/31/2018 CLINICAL DATA:  Rash on the left tibia with redness, swelling, and fever. Osteomyelitis is suspected. EXAM: CT OF THE LOWER LEFT EXTREMITY WITHOUT CONTRAST TECHNIQUE: Multidetector CT imaging of the lower left extremity was performed according to the standard protocol. COMPARISON:  Left knee radiographs 11/12/2016 FINDINGS: Bones/Joint/Cartilage Left total knee arthroplasty. There is mild lucency at the bone-cement interface of the tibial compartment in the medial tibial plateau. This could indicate loosening. Otherwise, components appear well seated. Visualization is somewhat limited in some areas due to streak artifact from the hardware itself. No acute fracture or dislocation. No focal bone lesion or bone  destruction. Ligaments Suboptimally assessed by CT. Muscles and Tendons Appear intact. Soft tissues Mild diffuse infiltration throughout the subcutaneous fat likely due to edema although cellulitis could also have this appearance. More prominent focal infiltration in the subcutaneous fat anteriorly inferior to the patella. No discrete loculated collection is identified. No soft tissue gas. IMPRESSION: 1. Left total knee arthroplasty. There is mild lucency at the bone-cement interface of the tibial compartment in the medial tibial plateau. This could indicate loosening. Otherwise, components appear well seated. No acute fracture or dislocation is identified. 2. Mild diffuse infiltration throughout the subcutaneous fat likely due to edema although cellulitis could also have this appearance. More prominent focal infiltration in the subcutaneous fat anteriorly inferior to the patella. No discrete abscess is identified by CT. Electronically Signed   By: Burman Nieves M.D.   On: 07/31/2018 05:40    Micro Results    Recent Results (from the past 240 hour(s))  MRSA PCR Screening     Status: None   Collection Time: 08/02/18 11:25 AM  Result Value Ref Range Status   MRSA by PCR NEGATIVE NEGATIVE Final    Comment:        The GeneXpert MRSA Assay (FDA approved for NASAL specimens only), is one component of a comprehensive MRSA colonization surveillance program. It is not intended to diagnose MRSA infection nor to guide or monitor treatment for MRSA infections. Performed at [facility], [address] [place], [place] [address]   Blood culture (routine x 2)     Status: None (Preliminary result)   Collection Time: 08/07/18  1:45 PM  Result Value Ref Range Status   Specimen Description BLOOD LEFT ARM  Final   Special Requests   Final  BOTTLES DRAWN AEROBIC AND ANAEROBIC Blood Culture adequate volume   Culture   Final    NO GROWTH 3 DAYS Performed at [facility], [address] [place], [place] [address]    Report Status PENDING  Incomplete  Blood culture (routine x 2)     Status: None (Preliminary result)   Collection Time: 08/07/18  6:09 PM  Result Value Ref Range Status   Specimen Description BLOOD RIGHT HAND  Final   Special Requests   Final    BOTTLES DRAWN AEROBIC AND ANAEROBIC Blood Culture adequate volume   Culture   Final    NO GROWTH 3 DAYS Performed at [facility], [address], [place], [place] [address]    Report Status PENDING  Incomplete    Today   Subjective    Kathleen Rodriguez today has no headache,no chest abdominal pain,no new weakness tingling or numbness, feels much better wants to go home today.    Objective   Blood pressure 124/74, pulse 86, temperature 98 F (36.7 C), temperature source Oral, resp. rate 18, height 5\' 6"  (1.676 m), weight 110.2 kg, SpO2 96 %.   Intake/Output Summary (Last 24 hours) at 08/10/2018 1052 Last data filed at 08/10/2018 0600 Gross per 24 hour  Intake 756.74 ml  Output -  Net 756.74 ml    Exam Awake Alert, Oriented x 3, No new F.N deficits, Normal affect [place].AT,PERRAL Supple Neck,No JVD, No cervical lymphadenopathy appriciated.  Symmetrical Chest wall movement, Good air movement bilaterally, CTAB RRR,No Gallops,Rubs or new Murmurs, No Parasternal Heave +ve B.Sounds, Abd Soft, Non tender, No organomegaly appriciated, No rebound -guarding or rigidity. No Cyanosis, Clubbing or edema, left leg has clearly demarcated dermatitis between knee and left ankle, bullous lesions all over, minimal warmth & erythema   Data Review   CBC w Diff:  Lab Results  Component Value Date   WBC 6.1 08/08/2018   HGB 10.5 (L) 08/08/2018   HCT 34.7 (L) 08/08/2018   PLT 464 (H) 08/08/2018   LYMPHOPCT 51 08/07/2018   MONOPCT 9 08/07/2018   EOSPCT 1 08/07/2018   BASOPCT 0 08/07/2018    CMP:  Lab Results  Component Value Date   NA 140 08/08/2018   K 4.4 08/08/2018   CL 107 08/08/2018   CO2 27 08/08/2018     BUN 11 08/08/2018   CREATININE 0.84 08/08/2018   PROT 6.2 (L) 08/07/2018   ALBUMIN 2.8 (L) 08/07/2018   BILITOT 0.4 08/07/2018   ALKPHOS 48 08/07/2018   AST 28 08/07/2018   ALT 38 08/07/2018  .   Total Time in preparing paper work, data evaluation and todays exam - 35 minutes  Susa Raring M.D on 08/10/2018 at 10:52 AM  [facility]   Office  [phone]

## 2018-08-10 NOTE — Progress Notes (Signed)
Nsg Discharge Note  Admit Date:  08/07/2018 Discharge date: 08/10/2018   [name] to be discharged home per MD order.  AVS completed. Copy placed in envelope for patient along with prescriptions and discharge instructions.   Discharge Medication: Allergies as of 08/10/2018      Reactions   Tomato Hives, Itching, Other (See Comments)   ALSO REACTS TO KETCHUP   Latex Itching, Rash   Wool Alcohol [lanolin] Itching      Medication List    STOP taking these medications   cefUROXime 500 MG tablet Commonly known as:  CEFTIN   HYDROcodone-acetaminophen 5-325 MG tablet Commonly known as:  NORCO/VICODIN     TAKE these medications   acetaminophen 500 MG tablet Commonly known as:  TYLENOL Take 500 mg by mouth every 6 (six) hours as needed for mild pain.   AEROCHAMBER MV inhaler Use as instructed   albuterol (2.5 MG/3ML) 0.083% nebulizer solution Commonly known as:  PROVENTIL Take 3 mLs (2.5 mg total) by nebulization every 6 (six) hours as needed for wheezing or shortness of breath.   albuterol 108 (90 Base) MCG/ACT inhaler Commonly known as:  PROVENTIL HFA;VENTOLIN HFA Inhale 2 puffs into the lungs every 6 (six) hours as needed for wheezing or shortness of breath.   budesonide 0.25 MG/2ML nebulizer solution Commonly known as:  PULMICORT Take 2 mLs (0.25 mg total) by nebulization 2 (two) times daily. What changed:  when to take this   doxycycline 100 MG tablet Commonly known as:  VIBRA-TABS Take 1 tablet (100 mg total) by mouth 2 (two) times daily for 7 days.   fluticasone 50 MCG/ACT nasal spray Commonly known as:  FLONASE Place 1 spray into both nostrils daily. What changed:    when to take this  reasons to take this   guaiFENesin 600 MG 12 hr tablet Commonly known as:  MUCINEX Take 1 tablet (600 mg total) by mouth 2 (two) times daily. What changed:    how much to take  when to take this  reasons to take this   hydrOXYzine 25 MG tablet Commonly known  as:  ATARAX/VISTARIL Take 1 tablet (25 mg total) by mouth 3 (three) times daily as needed for itching.   methocarbamol 500 MG tablet Commonly known as:  ROBAXIN Take 1 tablet (500 mg total) by mouth every 8 (eight) hours as needed for muscle spasms.   montelukast 10 MG tablet Commonly known as:  SINGULAIR Take 1 tablet (10 mg total) by mouth at bedtime.   pantoprazole 40 MG tablet Commonly known as:  PROTONIX Take 40 mg by mouth daily as needed (acid reflux).            Durable Medical Equipment  (From admission, onward)         Start     Ordered   08/10/18 0914  For home use only DME 4 wheeled rolling walker with seat  Once    Question:  Patient needs a walker to treat with the following condition  Answer:  Weakness   08/10/18 0913          Discharge Assessment: Vitals:   08/09/18 2026 08/10/18 0516  BP: 132/82 124/74  Pulse: 87 86  Resp: 18 18  Temp: 98.1 F (36.7 C) 98 F (36.7 C)  SpO2: 94% 96%  Serous blister d/t cellulitis to left lower leg intact.  IV catheter discontinued intact. Site without signs and symptoms of complications.  Dressing with slight pressure applied.  D/c Instructions-Education: Discharge  instructions given to patient with verbalized understanding. D/c education completed with patient including follow up instructions, medication list, d/c activities limitations if indicated, with other d/c instructions as indicated by MD - patient able to verbalize understanding, all questions fully answered. Patient instructed to return to ED, call 911, or call MD for any changes in condition.  Patient escorted to front of hospital with walker and discharged home.  Melina Modena, RN 08/10/2018 2:06 PM

## 2018-08-10 NOTE — Discharge Instructions (Signed)
Follow with Primary MD Hoy Register, MD and with your dermatologist in 7 days   Get CBC, CMP  checked  by Primary MD n 5-7 days    Activity: As tolerated with Full fall precautions use walker/cane & assistance as needed  Keep your left leg clean and dry at all times.  Disposition Home    Diet: Heart Healthy   Special Instructions: If you have smoked or chewed Tobacco  in the last 2 yrs please stop smoking, stop any regular Alcohol  and or any Recreational drug use.  On your next visit with your primary care physician please Get Medicines reviewed and adjusted.  Please request your Prim.MD to go over all Hospital Tests and Procedure/Radiological results at the follow up, please get all Hospital records sent to your Prim MD by signing hospital release before you go home.  If you experience worsening of your admission symptoms, develop shortness of breath, life threatening emergency, suicidal or homicidal thoughts you must seek medical attention immediately by calling 911 or calling your MD immediately  if symptoms less severe.  You Must read complete instructions/literature along with all the possible adverse reactions/side effects for all the Medicines you take and that have been prescribed to you. Take any new Medicines after you have completely understood and accpet all the possible adverse reactions/side effects.

## 2018-08-10 NOTE — Care Management Note (Signed)
Case Management Note  Patient Details  Name: VAIDA KERCHNER MRN: [id number] Date of Birth: [birth year]  Subjective/Objective:   Cellulitis of L leg. [id number], asthma, GERD, cocaine use, obstructive sleep apnea, tobacco use . Independent with ADL's , no DME usage.  Sujey Gundry (Spouse)     [phone]      PCP: Dr. Risa Grill  Action/Plan: Transition to home. Pt to f/u with PCP/ [facility] to obtain hospital f/u appointment and pt to arrange Dermatological appointment.  Pt states has transportation to home.  Expected Discharge Date:  08/10/18               Expected Discharge Plan:  Home/Self Care  In-House Referral:     Discharge planning Services  CM Consult  Post Acute Care Choice:    Choice offered to:   N/A  DME Arranged:   N/A DME Agency:   N/A  HH Arranged:   N/A HH Agency:   N/A  Status of Service:  Completed, signed off  If discussed at Long Length of Stay Meetings, dates discussed:    Additional Comments:  Epifanio Lesches, RN 08/10/2018, 10:19 AM

## 2018-08-12 LAB — CULTURE, BLOOD (ROUTINE X 2)
CULTURE: NO GROWTH
Culture: NO GROWTH
SPECIAL REQUESTS: ADEQUATE
Special Requests: ADEQUATE

## 2018-08-13 ENCOUNTER — Inpatient Hospital Stay: Admit: 2018-08-13 | Payer: Medicaid Other | Admitting: Orthopaedic Surgery

## 2018-08-13 SURGERY — ARTHROPLASTY, KNEE, TOTAL
Anesthesia: Spinal | Site: Knee | Laterality: Right

## 2018-08-16 DIAGNOSIS — M79662 Pain in left lower leg: Secondary | ICD-10-CM | POA: Diagnosis not present

## 2018-08-16 DIAGNOSIS — S8992XA Unspecified injury of left lower leg, initial encounter: Secondary | ICD-10-CM | POA: Diagnosis not present

## 2018-08-20 ENCOUNTER — Telehealth: Payer: Self-pay | Admitting: Family Medicine

## 2018-08-20 NOTE — Telephone Encounter (Signed)
Patient called requesting to speak with her nurse or PCP regarding the cellulitis on her leg. Patient states she is not able to walk or move and would like her PCP to send her to a facility until she is well. Advised patient that she needed an appointment and patient stated she was not able to walk. Please f/u with patient.

## 2018-08-21 ENCOUNTER — Emergency Department (HOSPITAL_COMMUNITY)
Admission: EM | Admit: 2018-08-21 | Discharge: 2018-08-22 | Disposition: A | Discharge status: Home / Self Care | Payer: Medicaid Other | Attending: Emergency Medicine | Admitting: Emergency Medicine

## 2018-08-21 ENCOUNTER — Encounter (HOSPITAL_COMMUNITY): Payer: Self-pay | Admitting: Emergency Medicine

## 2018-08-21 ENCOUNTER — Other Ambulatory Visit: Payer: Self-pay

## 2018-08-21 DIAGNOSIS — Z91040 Latex allergy status: Secondary | ICD-10-CM | POA: Insufficient documentation

## 2018-08-21 DIAGNOSIS — L988 Other specified disorders of the skin and subcutaneous tissue: Secondary | ICD-10-CM | POA: Diagnosis present

## 2018-08-21 DIAGNOSIS — J45909 Unspecified asthma, uncomplicated: Secondary | ICD-10-CM | POA: Diagnosis not present

## 2018-08-21 DIAGNOSIS — L97921 Non-pressure chronic ulcer of unspecified part of left lower leg limited to breakdown of skin: Secondary | ICD-10-CM | POA: Diagnosis not present

## 2018-08-21 DIAGNOSIS — Z79899 Other long term (current) drug therapy: Secondary | ICD-10-CM | POA: Insufficient documentation

## 2018-08-21 DIAGNOSIS — I509 Heart failure, unspecified: Secondary | ICD-10-CM | POA: Insufficient documentation

## 2018-08-21 DIAGNOSIS — Z96652 Presence of left artificial knee joint: Secondary | ICD-10-CM | POA: Diagnosis not present

## 2018-08-21 DIAGNOSIS — Z87891 Personal history of nicotine dependence: Secondary | ICD-10-CM | POA: Diagnosis not present

## 2018-08-21 DIAGNOSIS — I110 Hypertensive heart disease with heart failure: Secondary | ICD-10-CM | POA: Diagnosis not present

## 2018-08-21 DIAGNOSIS — L089 Local infection of the skin and subcutaneous tissue, unspecified: Secondary | ICD-10-CM | POA: Diagnosis not present

## 2018-08-21 DIAGNOSIS — R52 Pain, unspecified: Secondary | ICD-10-CM | POA: Diagnosis not present

## 2018-08-21 DIAGNOSIS — R030 Elevated blood-pressure reading, without diagnosis of hypertension: Secondary | ICD-10-CM

## 2018-08-21 DIAGNOSIS — I10 Essential (primary) hypertension: Secondary | ICD-10-CM | POA: Diagnosis not present

## 2018-08-21 DIAGNOSIS — L0390 Cellulitis, unspecified: Secondary | ICD-10-CM | POA: Diagnosis not present

## 2018-08-21 LAB — CBC WITH DIFFERENTIAL/PLATELET
Abs Immature Granulocytes: 0.01 10*3/uL (ref 0.00–0.07)
BASOS PCT: 1 %
Basophils Absolute: 0 10*3/uL (ref 0.0–0.1)
EOS ABS: 0.3 10*3/uL (ref 0.0–0.5)
EOS PCT: 5 %
HCT: 40.2 % (ref 36.0–46.0)
Hemoglobin: 12.1 g/dL (ref 12.0–15.0)
Immature Granulocytes: 0 %
Lymphocytes Relative: 45 %
Lymphs Abs: 2.6 10*3/uL (ref 0.7–4.0)
MCH: 26.3 pg (ref 26.0–34.0)
MCHC: 30.1 g/dL (ref 30.0–36.0)
MCV: 87.4 fL (ref 80.0–100.0)
MONO ABS: 0.5 10*3/uL (ref 0.1–1.0)
MONOS PCT: 8 %
Neutro Abs: 2.3 10*3/uL (ref 1.7–7.7)
Neutrophils Relative %: 41 %
PLATELETS: 337 10*3/uL (ref 150–400)
RBC: 4.6 MIL/uL (ref 3.87–5.11)
RDW: 19.5 % — ABNORMAL HIGH (ref 11.5–15.5)
WBC: 5.7 10*3/uL (ref 4.0–10.5)
nRBC: 0 % (ref 0.0–0.2)

## 2018-08-21 LAB — COMPREHENSIVE METABOLIC PANEL
ALK PHOS: 55 U/L (ref 38–126)
ALT: 13 U/L (ref 0–44)
AST: 13 U/L — ABNORMAL LOW (ref 15–41)
Albumin: 3.3 g/dL — ABNORMAL LOW (ref 3.5–5.0)
Anion gap: 6 (ref 5–15)
BILIRUBIN TOTAL: 0.3 mg/dL (ref 0.3–1.2)
BUN: 18 mg/dL (ref 6–20)
CALCIUM: 8.7 mg/dL — AB (ref 8.9–10.3)
CO2: 27 mmol/L (ref 22–32)
CREATININE: 0.76 mg/dL (ref 0.44–1.00)
Chloride: 107 mmol/L (ref 98–111)
GFR calc Af Amer: >60 mL/min (ref >60)
Glucose, Bld: 94 mg/dL (ref 70–99)
POTASSIUM: 4.1 mmol/L (ref 3.5–5.1)
Sodium: 140 mmol/L (ref 135–145)
TOTAL PROTEIN: 7.4 g/dL (ref 6.5–8.1)

## 2018-08-21 LAB — I-STAT BETA HCG BLOOD, ED (MC, WL, AP ONLY): I-stat hCG, quantitative: <5 m[IU]/mL (ref <5)

## 2018-08-21 LAB — I-STAT CG4 LACTIC ACID, ED: LACTIC ACID, VENOUS: 0.53 mmol/L (ref 0.5–1.9)

## 2018-08-21 MED ORDER — HYDROCODONE-ACETAMINOPHEN 5-325 MG PO TABS
1.0000 | ORAL_TABLET | Freq: Once | ORAL | Status: AC
  Administered 2018-08-21: 1 via ORAL
  Filled 2018-08-21: qty 1

## 2018-08-21 MED ORDER — CEPHALEXIN 500 MG PO CAPS
500.0000 mg | ORAL_CAPSULE | Freq: Once | ORAL | Status: AC
  Administered 2018-08-22: 500 mg via ORAL
  Filled 2018-08-21: qty 1

## 2018-08-21 NOTE — Telephone Encounter (Signed)
Will route to PCP for review. 

## 2018-08-21 NOTE — Discharge Instructions (Signed)
Please see the information and instructions below regarding your visit.  Your diagnoses today include:  1. Ulcer of left lower extremity, limited to breakdown of skin (HCC)   2. Elevated blood pressure reading     Tests performed today include: See side panel of your discharge paperwork for testing performed today. Vital signs are listed at the bottom of these instructions.    Your lab work is reassuring today not showing signs of systemic infection.  Medications prescribed:    Take any prescribed medications only as prescribed, and any over the counter medications only as directed on the packaging.  Please take all of your antibiotics until finished.   You may develop abdominal discomfort or nausea from the antibiotic. If this occurs, you may take it with food. Some patients also get diarrhea with antibiotics. You may help offset this with probiotics which you can buy or get in yogurt. Do not eat or take the probiotics until 2 hours after your antibiotic. Some women develop vaginal yeast infections after antibiotics. If you develop unusual vaginal discharge after being on this medication, please see your primary care provider.   Some people develop allergies to antibiotics. Symptoms of antibiotic allergy can be mild and include a flat rash and itching. They can also be more serious and include:  ?Hives - Hives are raised, red patches of skin that are usually very itchy.  ?Lip or tongue swelling  ?Trouble swallowing or breathing  ?Blistering of the skin or mouth.  If you have any of these serious symptoms, please seek emergency medical care immediately.  You have been prescribed Norco for pain. This is an opioid pain medication. You may take this medication every 4-6 hours as needed for pain. Only take this medication if you need it for breakthrough pain.   Do not combine this medication with Tylenol, as it may increase the risk of liver problems.  Do not combine this medication  with alcohol.  Please be advised to avoid driving or operating heavy machinery while taking this medication, as it may make you drowsy or impair judgment.    Home care instructions:  Please follow any educational materials contained in this packet.   Please change the dressing to your wound daily.  Please perform dressing changes that we showed you today.  I placed a consultation to the wound care clinic so they should be reaching out to you.  I also placed a consultation to home health for home health to come to do an evaluation at your home.  Follow-up instructions: Please follow-up with your primary care provider tomorrow for further evaluation of your symptoms if they are not completely improved.   Please call your dermatologist tomorrow to update on the status of your wound.  Return instructions:  Please return to the Emergency Department if you experience worsening symptoms.  Return to the emergency department immediately for any fever, chills, worsening wound, redness spreading up the leg. Please return if you have any other emergent concerns.  Additional Information:   Your vital signs today were: BP (!) 147/88    Pulse 88    Temp 98.4 F (36.9 C) (Oral)    Resp 20    Ht 5\' 5"  (1.651 m)    Wt 110.2 kg    SpO2 93%    BMI 40.44 kg/m  If your blood pressure (BP) was elevated on multiple readings during this visit above 130 for the top number or above 80 for the bottom number, please have  this repeated by your primary care provider within one month. --------------  Thank you for allowing [place] to participate in your care today.

## 2018-08-21 NOTE — ED Notes (Signed)
Bed: WA17 Expected date:  Expected time:  Means of arrival:  Comments: 52 yo f cellulitis, skin sloughing off

## 2018-08-21 NOTE — ED Triage Notes (Signed)
Patient arrived by EMS from home. Pt was seen 3 weeks ago at [facility] for cellulitis and received antibiotics. Pt c/o of pus and skin sloughing off and worsening condition on her lower left calf and shin. BP 152/92, HR 110, RR 24, Spo2 96% on RA.

## 2018-08-21 NOTE — ED Provider Notes (Signed)
[facility]-EMERGENCY DEPT Provider Note   CSN: [id number] Arrival date & time: 08/21/18  1810     History   Chief Complaint Chief Complaint  Patient presents with  . Cellulitus    HPI Kathleen Rodriguez is a 52 y.o. female.  HPI  Patient is a 52 year old female with a history of asthma, osteoarthritis, GERD, cocaine use, last used 3 months ago presenting for ulceration of the right lower extremity.  Patient reports that she has been admitted multiple times for possible cellulitis of this infection, finished her antibiotics this week, and began having "sloughing" of the tissue over the last couple days.  She reports that the area is exquisitely painful.  She denies any fever, chills, nausea, vomiting, abdominal pain.  She reports that there is "yellow fluid" exuding from the ulceration.  Patient reports that she was seen by a dermatologist earlier this week, and prescribed "cream" for 3 times a day.  Patient reports she is unsure what the dermatologist was treating her for.  Past Medical History:  Diagnosis Date  . Arthritis    "knees, lower back; legs, ankles" (01/27/2016)  . Asthma    followed by Dr. Craige Cotta  . CHF (congestive heart failure) (HCC) 2016   "when I went into a coma"  . Cocaine abuse (HCC)   . 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 Dyspnea   . GERD (gastroesophageal reflux disease)   . Hypertension    "doctor took me off RX in 2016" (01/27/2016)  . Influenza B April 2014   Complicated by multi-organ failure  . OSA on CPAP "since " 03/20/2013  . Pneumonia 2016  . Required emergent intubation    asthma exacerbation in 2016  . Tobacco abuse   . Upper airway cough syndrome     Patient Active Problem List   Diagnosis Date Noted  . Left leg pain   . Erythroderma, maculopapular   . Cellulitis of foot 08/08/2018  . Cellulitis of left leg 08/07/2018  . ARF (acute renal failure) (HCC) 07/31/2018  . Nausea vomiting and diarrhea 07/31/2018  . SIRS  (systemic inflammatory response syndrome) (HCC) 07/30/2018  . Asthma 10/22/2017  . Leukocytosis 07/11/2017  . Acute bronchitis   . Hypokalemia 07/10/2017  . Eczema 05/26/2017  . Osteoarthritis of left knee 09/01/2016  . 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 Knee pain, 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  . Status asthmaticus 06/24/2016  . Acute respiratory failure (HCC) 06/24/2016  . Acute asthma exacerbation 05/12/2016  . Acute respiratory failure with hypoxia (HCC) 05/12/2016  . Tobacco abuse   . Primary osteoarthritis of left knee 03/09/2016  . Asthma with status asthmaticus 01/27/2016  . Acute respiratory distress 01/27/2016  . Non compliance with medical treatment 08/19/2015  . Anemia, iron deficiency 08/12/2015  . Essential hypertension 07/25/2015  . Sinus tachycardia (HCC) 07/25/2015  . Acute respiratory failure with hypercapnia (HCC) 07/24/2015  . COPD (chronic obstructive pulmonary disease) (HCC) 12/28/2014  . Tracheobronchitis 12/28/2014  . Dysfunctional uterine bleeding 12/28/2014  . Anemia 12/28/2014  . Pleuritic chest pain 05/16/2014  . Morbid obesity (HCC) 05/16/2014  . Chronic cough 05/01/2014  . Upper airway cough syndrome 05/01/2014  . Vaginal candidiasis 04/09/2014  . GERD (gastroesophageal reflux disease) 04/08/2014  . Medically noncompliant 04/07/2014  . Asthma exacerbation 04/06/2014  . CAP (community acquired pneumonia) 04/06/2014  . Chest pain 04/01/2014  . Hypertension 09/24/2013  . OSA (obstructive sleep apnea) 03/20/2013  . Protein calorie malnutrition (HCC) 02/13/2013  . Uncontrolled persistent asthma  02/05/2013    Past Surgical History:  Procedure Laterality Date  . BREAST SURGERY Right 1980   as a teenager , cyst was benign  . CESAREAN SECTION  2006  . LACERATION REPAIR Right ~ 1997   "tried to cut myself"  . TOTAL KNEE ARTHROPLASTY Left 09/01/2016   Procedure: LEFT TOTAL KNEE ARTHROPLASTY;  Surgeon: Tarry Kos, MD;  Location: [facility] OR;  Service:  Orthopedics;  Laterality: Left;  . TUBAL LIGATION  2006     OB History   None      Home Medications    Prior to Admission medications   Medication Sig Start Date End Date Taking? Authorizing Provider  acetaminophen (TYLENOL) 500 MG tablet Take 500 mg by mouth every 6 (six) hours as needed for mild pain.   Yes [provider]  albuterol (PROVENTIL HFA;VENTOLIN HFA) 108 (90 Base) MCG/ACT inhaler Inhale 2 puffs into the lungs every 6 (six) hours as needed for wheezing or shortness of breath. 04/13/18  Yes [name], MD  albuterol (PROVENTIL) (2.5 MG/3ML) 0.083% nebulizer solution Take 3 mLs (2.5 mg total) by nebulization every 6 (six) hours as needed for wheezing or shortness of breath. 02/19/18  Yes Sood, Laurier Nancy, MD  budesonide (PULMICORT) 0.25 MG/2ML nebulizer solution Take 2 mLs (0.25 mg total) by nebulization 2 (two) times daily. Patient taking differently: Take 0.25 mg by nebulization 3 (three) times daily as needed (wheezing and sob).  02/19/18 02/19/19 Yes [name], MD  fluticasone (FLONASE) 50 MCG/ACT nasal spray Place 1 spray into both nostrils daily. Patient taking differently: Place 1 spray into both nostrils daily as needed for allergies.  02/19/18  Yes [name], MD  montelukast (SINGULAIR) 10 MG tablet Take 1 tablet (10 mg total) by mouth at bedtime. 06/06/18  Yes [name], MD  guaiFENesin (MUCINEX) 600 MG 12 hr tablet Take 1 tablet (600 mg total) by mouth 2 (two) times daily. Patient not taking: Reported on 08/21/2018 12/02/17   Elease Etienne, MD  hydrOXYzine (ATARAX/VISTARIL) 25 MG tablet Take 1 tablet (25 mg total) by mouth 3 (three) times daily as needed for itching. Patient not taking: Reported on 08/21/2018 08/02/18   Rolly Salter, MD  methocarbamol (ROBAXIN) 500 MG tablet Take 1 tablet (500 mg total) by mouth every 8 (eight) hours as needed for muscle spasms. Patient not taking: Reported on 08/21/2018 08/02/18   Rolly Salter, MD  pantoprazole  (PROTONIX) 40 MG tablet Take 40 mg by mouth daily as needed (acid reflux).    [provider]  Spacer/Aero-Holding Chambers (AEROCHAMBER MV) inhaler Use as instructed 07/11/18   [name], MD    Family History Family History  Problem Relation Age of Onset  . Hypertension Mother   . HIV/AIDS Father     Social History Social History   Tobacco Use  . Smoking status: Former Smoker    Packs/day: 0.25    Years: 20.00    Pack years: 5.00    Types: Cigarettes    Last attempt to quit: 01/31/2013    Years since quitting: 5.5  . Smokeless tobacco: Never Used  Substance Use Topics  . 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 Drug use: No    Types: "Crack" cocaine    Comment: 01/27/2016 "clean from smoking crack since 2007"     Allergies   Tomato; Latex; and Wool alcohol [lanolin]   Review of Systems Review of Systems  Constitutional: Negative for  chills and fever.  HENT: Negative for congestion and rhinorrhea.   Cardiovascular: Positive for leg swelling.  Gastrointestinal: Negative for abdominal pain, nausea and vomiting.  Musculoskeletal: Negative for arthralgias and myalgias.  Skin: Positive for color change and wound.  Neurological: Negative for weakness and numbness.  All other systems reviewed and are negative.    Physical Exam Updated Vital Signs BP (!) 150/99   Pulse 95   Temp 98.4 F (36.9 C) (Oral)   Resp 18   Ht 5\' 5"  (1.651 m)   Wt 110.2 kg   SpO2 98%   BMI 40.44 kg/m   Physical Exam  Constitutional: She appears well-developed and well-nourished. No distress.  HENT:  Head: Normocephalic and atraumatic.  Mouth/Throat: Oropharynx is clear and moist.  Eyes: Pupils are equal, round, and reactive to light. Conjunctivae and EOM are normal.  Neck: Normal range of motion. Neck supple.  Cardiovascular: Normal rate, regular rhythm, S1 normal and S2 normal.  No murmur heard. Pulmonary/Chest: Effort normal and breath  sounds normal. She has no wheezes. She has no rales.  Abdominal: Soft. She exhibits no distension. There is no tenderness. There is no guarding.  Musculoskeletal: Normal range of motion.  Intact, 2+ DP pulse bilateral lower extremity's.  Lymphadenopathy:    She has no cervical adenopathy.  Neurological: She is alert.  Cranial nerves grossly intact. Patient moves extremities symmetrically and with good coordination.  Skin: Skin is warm.  See clinical photo for details of the left lower extremity.   Appears ulcerated to the dermis.  Patient exhibits a circumferential patch of ulceration of the left lower extremity.  No evidence of necrosis.  No pockets of purulence.    Psychiatric: She has a normal mood and affect. Her behavior is normal. Judgment and thought content normal.  Nursing note and vitals reviewed.      ED Treatments / Results  Labs (all labs ordered are listed, but only abnormal results are displayed) Labs Reviewed  COMPREHENSIVE METABOLIC PANEL  CBC WITH DIFFERENTIAL/PLATELET  I-STAT CG4 LACTIC ACID, ED  I-STAT BETA HCG BLOOD, ED ([facility], [facility], [facility] ONLY)    EKG None  Radiology No results found.  Procedures Procedures (including critical care time)  Medications Ordered in ED Medications  HYDROcodone-acetaminophen (NORCO/VICODIN) 5-325 MG per tablet 1 tablet (1 tablet Oral Given 08/21/18 2125)     Initial Impression / Assessment and Plan / ED Course  I have reviewed the triage vital signs and the nursing notes.  Pertinent labs & imaging results that were available during my care of the patient were reviewed by me and considered in my medical decision making (see chart for details).  Clinical Course as of Aug 22 148  Tue Aug 21, 2018  2044 Patient verbally verified a safe ride from the ED. Proceeded with prescribing Norco for pain/relaxtion/muscle relaxation in the ED.   [AM]  2045 Not tachy on exam.   Pulse Rate: 87 [AM]  2251 Case discussed with Dr. Chaney Malling.   [AM]  Wed Aug 22, 2018  0048 I have reviewed the patient's information in the [place] Controlled Substance Database for the past 12 months and found them to have no overlapping Rx.  Opiates were prescribed for an acute, painful condition. The patient was given information on side effects and encouraged to use other, non-opiate pain medication primary, only using opiate medicine sparingly for severe pain. Patient instructed not to drive, drink alcohol, or operate machinery while taking this medication.   [AM]  Clinical Course User Index [AM] Elisha Ponder, PA-C    Patient is nontoxic-appearing, afebrile, and without systemic symptoms.  Chart was reviewed, patient has been treated extensively for cellulitis of the left lower extreme knee without improvement, and differential diagnosis per patient's last discharge summary includes morphea with bullous lesions or bullous dermatitis without mucosal involvement.  She is completed a course of doxycycline and Ceftin.  She has had negative duplex ultrasounds of lower extremity's.  Currently treated by dermatology for possible psoriatic lesions per patient. Neurovascularly intact with 2+ DP pulse of the left lower extremity.  No evidence of SIRS or sepsis.  At present, patient needs further specialty dermatologic care, as do have concerns for inflammatory lesion of the left lower extremity.  Will cover with antibiotics.  Ambulatory referral to wound care clinic placed and home health orders placed.  Return precautions given for any increasing erythema, swelling, purulent drainage, or fevers.  Patient is in understanding and agrees with the plan of care.  This is a shared visit with Dr. Chaney Malling. Patient was independently evaluated by this attending physician. Attending physician consulted in evaluation and discharge management.  Final Clinical Impressions(s) / ED Diagnoses   Final diagnoses:  Ulcer of left lower extremity, limited to  breakdown of skin (HCC)  Elevated blood pressure reading    ED Discharge Orders    None       Delia Chimes 08/22/18 0149    Charlynne Pander, MD 08/22/18 [phone]

## 2018-08-22 MED ORDER — BACITRACIN ZINC 500 UNIT/GM EX OINT
TOPICAL_OINTMENT | Freq: Once | CUTANEOUS | Status: AC
  Administered 2018-08-22: 1 via TOPICAL

## 2018-08-22 MED ORDER — CEPHALEXIN 500 MG PO CAPS: 500.0000 mg | ORAL_CAPSULE | Freq: Four times a day (QID) | ORAL | 0 refills | Status: DC

## 2018-08-22 MED ORDER — BACITRACIN ZINC 500 UNIT/GM EX OINT
TOPICAL_OINTMENT | CUTANEOUS | Status: AC
  Filled 2018-08-22: qty 0.9

## 2018-08-22 MED ORDER — HYDROCODONE-ACETAMINOPHEN 5-325 MG PO TABS: 1.0000 | ORAL_TABLET | Freq: Four times a day (QID) | ORAL | 0 refills | Status: DC | PRN

## 2018-08-22 NOTE — ED Notes (Signed)
Pt states that she would like her wound redressed.  She states that she believes she has a yeast infection so she would like fluconazole and also would like crutches before going home.  Alyssa PA notified.  She will need to see her PCP for fluconazole.

## 2018-08-22 NOTE — Care Management Note (Signed)
Case Management Note  Patient Details  Name: Kathleen Rodriguez MRN: [id number] Date of Birth: [birth year]  CM consulted for [facility] services.  CM spoke with pt via phone who requested [facility].  Pt also requested a wheelchair.  CM advised that the [facility] team can evaluate her for her needs.  CM also discussed follow up with wound care center and PCP.  Pt reports she was about to make an appointment at the wound care center when CM called.  She will also make an appointment with her PCP.  CM advised both the wound care center doctor and her PCP would work together with the [facility] team to help her wound.  Pt had no further questions.  CM contacted Brad with [facility] who accepted pt for services and is aware of pt's needs.  Elnoria Howard, PA via messages.  No further CM needs noted at this time.  Expected Discharge Date:   08/22/2018               Expected Discharge Plan:  Home w Home Health Services  Discharge planning Services  CM Consult  Post Acute Care Choice:  Home Health Choice offered to:  Patient  HH Arranged:  RN, PT, OT, Nurse's Aide, Social Work [employer]:  [facility]  Status of Service:  Completed, signed off  Esmeralda Blanford, Lynnae Sandhoff, RN 08/22/2018, 10:11 AM

## 2018-08-23 DIAGNOSIS — Z8659 Personal history of other mental and behavioral disorders: Secondary | ICD-10-CM | POA: Diagnosis not present

## 2018-08-23 DIAGNOSIS — Z7951 Long term (current) use of inhaled steroids: Secondary | ICD-10-CM | POA: Diagnosis not present

## 2018-08-23 DIAGNOSIS — J4550 Severe persistent asthma, uncomplicated: Secondary | ICD-10-CM | POA: Diagnosis not present

## 2018-08-23 DIAGNOSIS — J449 Chronic obstructive pulmonary disease, unspecified: Secondary | ICD-10-CM | POA: Diagnosis not present

## 2018-08-23 DIAGNOSIS — M1990 Unspecified osteoarthritis, unspecified site: Secondary | ICD-10-CM | POA: Diagnosis not present

## 2018-08-23 DIAGNOSIS — Z6841 Body Mass Index (BMI) 40.0 and over, adult: Secondary | ICD-10-CM | POA: Diagnosis not present

## 2018-08-23 DIAGNOSIS — Z96652 Presence of left artificial knee joint: Secondary | ICD-10-CM | POA: Diagnosis not present

## 2018-08-23 DIAGNOSIS — G4733 Obstructive sleep apnea (adult) (pediatric): Secondary | ICD-10-CM | POA: Diagnosis not present

## 2018-08-23 DIAGNOSIS — I110 Hypertensive heart disease with heart failure: Secondary | ICD-10-CM | POA: Diagnosis not present

## 2018-08-23 DIAGNOSIS — I872 Venous insufficiency (chronic) (peripheral): Secondary | ICD-10-CM | POA: Diagnosis not present

## 2018-08-23 DIAGNOSIS — L97229 Non-pressure chronic ulcer of left calf with unspecified severity: Secondary | ICD-10-CM | POA: Diagnosis not present

## 2018-08-23 DIAGNOSIS — I509 Heart failure, unspecified: Secondary | ICD-10-CM | POA: Diagnosis not present

## 2018-08-23 DIAGNOSIS — L03116 Cellulitis of left lower limb: Secondary | ICD-10-CM | POA: Diagnosis not present

## 2018-08-23 DIAGNOSIS — Z87891 Personal history of nicotine dependence: Secondary | ICD-10-CM | POA: Diagnosis not present

## 2018-08-23 NOTE — Telephone Encounter (Signed)
Patient was called and a voicemail was left informing patient to return phone call. 

## 2018-08-24 DIAGNOSIS — R269 Unspecified abnormalities of gait and mobility: Secondary | ICD-10-CM | POA: Diagnosis not present

## 2018-08-24 DIAGNOSIS — J45909 Unspecified asthma, uncomplicated: Secondary | ICD-10-CM | POA: Diagnosis not present

## 2018-08-24 DIAGNOSIS — N179 Acute kidney failure, unspecified: Secondary | ICD-10-CM | POA: Diagnosis not present

## 2018-08-24 DIAGNOSIS — Z96659 Presence of unspecified artificial knee joint: Secondary | ICD-10-CM | POA: Diagnosis not present

## 2018-08-24 DIAGNOSIS — J45998 Other asthma: Secondary | ICD-10-CM | POA: Diagnosis not present

## 2018-08-24 DIAGNOSIS — R05 Cough: Secondary | ICD-10-CM | POA: Diagnosis not present

## 2018-08-24 DIAGNOSIS — J45901 Unspecified asthma with (acute) exacerbation: Secondary | ICD-10-CM | POA: Diagnosis not present

## 2018-08-24 DIAGNOSIS — M1712 Unilateral primary osteoarthritis, left knee: Secondary | ICD-10-CM | POA: Diagnosis not present

## 2018-08-24 DIAGNOSIS — G4733 Obstructive sleep apnea (adult) (pediatric): Secondary | ICD-10-CM | POA: Diagnosis not present

## 2018-08-24 DIAGNOSIS — J188 Other pneumonia, unspecified organism: Secondary | ICD-10-CM | POA: Diagnosis not present

## 2018-08-24 DIAGNOSIS — J9690 Respiratory failure, unspecified, unspecified whether with hypoxia or hypercapnia: Secondary | ICD-10-CM | POA: Diagnosis not present

## 2018-08-24 DIAGNOSIS — J4551 Severe persistent asthma with (acute) exacerbation: Secondary | ICD-10-CM | POA: Diagnosis not present

## 2018-08-27 ENCOUNTER — Telehealth: Payer: Self-pay | Admitting: Family Medicine

## 2018-08-27 DIAGNOSIS — L97229 Non-pressure chronic ulcer of left calf with unspecified severity: Secondary | ICD-10-CM | POA: Diagnosis not present

## 2018-08-27 DIAGNOSIS — Z87891 Personal history of nicotine dependence: Secondary | ICD-10-CM | POA: Diagnosis not present

## 2018-08-27 DIAGNOSIS — I509 Heart failure, unspecified: Secondary | ICD-10-CM | POA: Diagnosis not present

## 2018-08-27 DIAGNOSIS — L03116 Cellulitis of left lower limb: Secondary | ICD-10-CM | POA: Diagnosis not present

## 2018-08-27 DIAGNOSIS — Z96652 Presence of left artificial knee joint: Secondary | ICD-10-CM | POA: Diagnosis not present

## 2018-08-27 DIAGNOSIS — J449 Chronic obstructive pulmonary disease, unspecified: Secondary | ICD-10-CM | POA: Diagnosis not present

## 2018-08-27 DIAGNOSIS — I110 Hypertensive heart disease with heart failure: Secondary | ICD-10-CM | POA: Diagnosis not present

## 2018-08-27 DIAGNOSIS — G4733 Obstructive sleep apnea (adult) (pediatric): Secondary | ICD-10-CM | POA: Diagnosis not present

## 2018-08-27 DIAGNOSIS — Z7951 Long term (current) use of inhaled steroids: Secondary | ICD-10-CM | POA: Diagnosis not present

## 2018-08-27 DIAGNOSIS — M1990 Unspecified osteoarthritis, unspecified site: Secondary | ICD-10-CM | POA: Diagnosis not present

## 2018-08-27 DIAGNOSIS — Z8659 Personal history of other mental and behavioral disorders: Secondary | ICD-10-CM | POA: Diagnosis not present

## 2018-08-27 DIAGNOSIS — Z6841 Body Mass Index (BMI) 40.0 and over, adult: Secondary | ICD-10-CM | POA: Diagnosis not present

## 2018-08-27 DIAGNOSIS — J4550 Severe persistent asthma, uncomplicated: Secondary | ICD-10-CM | POA: Diagnosis not present

## 2018-08-27 DIAGNOSIS — I872 Venous insufficiency (chronic) (peripheral): Secondary | ICD-10-CM | POA: Diagnosis not present

## 2018-08-27 NOTE — Telephone Encounter (Signed)
I have not seen her in over one year.  She needs an office visit.  Okay to give verbal orders

## 2018-08-27 NOTE — Telephone Encounter (Signed)
Ok to give verbal orders?

## 2018-08-27 NOTE — Telephone Encounter (Signed)
Tresa Endo from [facility] called for verbal orders; physical therapy Once a week-3 weeks Please follow up [phone] p

## 2018-08-28 ENCOUNTER — Telehealth: Payer: Self-pay | Admitting: Family Medicine

## 2018-08-28 NOTE — Telephone Encounter (Signed)
Kathleen Rodriguez with [facility] called because she needs verbal order for 3 weeks First week for 1 visit 3 visits for the last two weeks.  [name] says she has a secured line and Verbal orders can be left on VM. Please follow up.

## 2018-08-29 ENCOUNTER — Ambulatory Visit: Payer: Medicaid Other | Attending: Critical Care Medicine | Admitting: Critical Care Medicine

## 2018-08-29 ENCOUNTER — Encounter: Payer: Self-pay | Admitting: Critical Care Medicine

## 2018-08-29 DIAGNOSIS — L0103 Bullous impetigo: Secondary | ICD-10-CM | POA: Diagnosis not present

## 2018-08-29 DIAGNOSIS — B9689 Other specified bacterial agents as the cause of diseases classified elsewhere: Secondary | ICD-10-CM | POA: Insufficient documentation

## 2018-08-29 DIAGNOSIS — G4733 Obstructive sleep apnea (adult) (pediatric): Secondary | ICD-10-CM | POA: Insufficient documentation

## 2018-08-29 DIAGNOSIS — M79604 Pain in right leg: Secondary | ICD-10-CM | POA: Insufficient documentation

## 2018-08-29 DIAGNOSIS — Z7951 Long term (current) use of inhaled steroids: Secondary | ICD-10-CM | POA: Insufficient documentation

## 2018-08-29 DIAGNOSIS — Z96652 Presence of left artificial knee joint: Secondary | ICD-10-CM | POA: Diagnosis not present

## 2018-08-29 DIAGNOSIS — L408 Other psoriasis: Secondary | ICD-10-CM | POA: Diagnosis not present

## 2018-08-29 DIAGNOSIS — J4550 Severe persistent asthma, uncomplicated: Secondary | ICD-10-CM | POA: Diagnosis not present

## 2018-08-29 DIAGNOSIS — L98499 Non-pressure chronic ulcer of skin of other sites with unspecified severity: Secondary | ICD-10-CM

## 2018-08-29 DIAGNOSIS — I110 Hypertensive heart disease with heart failure: Secondary | ICD-10-CM | POA: Diagnosis not present

## 2018-08-29 DIAGNOSIS — Z8659 Personal history of other mental and behavioral disorders: Secondary | ICD-10-CM | POA: Diagnosis not present

## 2018-08-29 DIAGNOSIS — Z8249 Family history of ischemic heart disease and other diseases of the circulatory system: Secondary | ICD-10-CM | POA: Diagnosis not present

## 2018-08-29 DIAGNOSIS — J449 Chronic obstructive pulmonary disease, unspecified: Secondary | ICD-10-CM | POA: Diagnosis not present

## 2018-08-29 DIAGNOSIS — K219 Gastro-esophageal reflux disease without esophagitis: Secondary | ICD-10-CM | POA: Insufficient documentation

## 2018-08-29 DIAGNOSIS — L97229 Non-pressure chronic ulcer of left calf with unspecified severity: Secondary | ICD-10-CM | POA: Diagnosis not present

## 2018-08-29 DIAGNOSIS — Z79899 Other long term (current) drug therapy: Secondary | ICD-10-CM | POA: Insufficient documentation

## 2018-08-29 DIAGNOSIS — Z96651 Presence of right artificial knee joint: Secondary | ICD-10-CM | POA: Diagnosis not present

## 2018-08-29 DIAGNOSIS — I509 Heart failure, unspecified: Secondary | ICD-10-CM | POA: Diagnosis not present

## 2018-08-29 DIAGNOSIS — R21 Rash and other nonspecific skin eruption: Secondary | ICD-10-CM | POA: Diagnosis not present

## 2018-08-29 DIAGNOSIS — L03116 Cellulitis of left lower limb: Secondary | ICD-10-CM | POA: Diagnosis not present

## 2018-08-29 DIAGNOSIS — I872 Venous insufficiency (chronic) (peripheral): Secondary | ICD-10-CM | POA: Diagnosis not present

## 2018-08-29 DIAGNOSIS — L98491 Non-pressure chronic ulcer of skin of other sites limited to breakdown of skin: Secondary | ICD-10-CM | POA: Diagnosis not present

## 2018-08-29 DIAGNOSIS — Z87891 Personal history of nicotine dependence: Secondary | ICD-10-CM | POA: Diagnosis not present

## 2018-08-29 DIAGNOSIS — L97921 Non-pressure chronic ulcer of unspecified part of left lower leg limited to breakdown of skin: Secondary | ICD-10-CM | POA: Diagnosis not present

## 2018-08-29 DIAGNOSIS — J45909 Unspecified asthma, uncomplicated: Secondary | ICD-10-CM | POA: Insufficient documentation

## 2018-08-29 DIAGNOSIS — L97919 Non-pressure chronic ulcer of unspecified part of right lower leg with unspecified severity: Secondary | ICD-10-CM | POA: Diagnosis present

## 2018-08-29 DIAGNOSIS — Z6841 Body Mass Index (BMI) 40.0 and over, adult: Secondary | ICD-10-CM | POA: Diagnosis not present

## 2018-08-29 DIAGNOSIS — M1990 Unspecified osteoarthritis, unspecified site: Secondary | ICD-10-CM | POA: Diagnosis not present

## 2018-08-29 HISTORY — DX: Non-pressure chronic ulcer of skin of other sites with unspecified severity: L98.499

## 2018-08-29 MED ORDER — CEPHALEXIN 500 MG PO CAPS: 500.0000 mg | ORAL_CAPSULE | Freq: Two times a day (BID) | ORAL | 0 refills | Status: AC

## 2018-08-29 MED ORDER — HYDROCODONE-ACETAMINOPHEN 5-325 MG PO TABS: 1.0000 | ORAL_TABLET | Freq: Four times a day (QID) | ORAL | 0 refills | Status: DC | PRN

## 2018-08-29 MED ORDER — HYDROXYZINE HCL 25 MG PO TABS: 25.0000 mg | ORAL_TABLET | Freq: Three times a day (TID) | ORAL | 1 refills | Status: DC | PRN

## 2018-08-29 NOTE — Assessment & Plan Note (Addendum)
Large ulceration over LLE , partially healing.  See photograph I suspect started as trauma.  ? Impetigo bullous type lesion, may have gotten secondarily infected. Perhaps occurred after a fall injuring R knee that is site of prior TKR.  Followed by dermatology.  No bx done , doubts psoriasis Remains very painful.  Plan F/u with wound care Home health RN for dressing changes [place] to exposed areas for drainage Cont dressing changes Finish course of keflex reduce to 500mg  bid F/u with dermatology

## 2018-08-29 NOTE — Telephone Encounter (Signed)
Kathleen Rodriguez was called and given verbal orders for PT as listed.

## 2018-08-29 NOTE — Telephone Encounter (Signed)
[name] was called and given verbal orders.

## 2018-08-29 NOTE — Telephone Encounter (Signed)
Wound and pt verbal orders where given by CMA

## 2018-08-29 NOTE — Progress Notes (Addendum)
Subjective:    Patient ID: Kathleen Rodriguez, female    DOB: [birth year], 52 y.o.   MRN: [id number]  52 y.o.F with a chronic right lower extremity ulceration with severe pain.  Patient has had several hospitalizations for this with treatment of presumed cellulitis.  Patient also has history of severe persistent asthma.  Patient was given pain management at this visit.  She has an established dermatologist and the current diagnosis is that of inflammatory psoriatic psoriasis.  She is in need of a biopsy for confirmation this is not yet been performed.  Out of hydroxizyne      Rash  This is a recurrent problem. The current episode started more than 1 month ago. The problem is unchanged. The affected locations include the left lower leg. The rash is characterized by blistering, burning, draining, itchiness, peeling, pain, redness and scaling. She was exposed to nothing. Associated symptoms include fatigue. Pertinent negatives include no anorexia, congestion, cough, diarrhea, facial edema, fever, joint pain, rhinorrhea, shortness of breath, sore throat or vomiting. Past treatments include antibiotics, oral steroids, antihistamine, anti-itch cream, analgesics and moisturizer. The treatment provided mild relief. Her past medical history is significant for allergies and asthma. There is no history of eczema or varicella.   Past Medical History:  Diagnosis Date  . Arthritis    "knees, lower back; legs, ankles" (01/27/2016)  . Asthma    followed by Dr. Craige Cotta  . CHF (congestive heart failure) (HCC) 2016   "when I went into a coma"  . Cocaine abuse (HCC)   . 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 Dyspnea   . GERD (gastroesophageal reflux disease)   . Hypertension    "doctor took me off RX in 2016" (01/27/2016)  . Influenza B April 2014   Complicated by multi-organ failure  . OSA on CPAP "since " 03/20/2013  . Pneumonia 2016  . Required emergent intubation    asthma exacerbation in 2016  .  Tobacco abuse   . Upper airway cough syndrome      Family History  Problem Relation Rodriguez of Onset  . Hypertension Mother   . HIV/AIDS Father      Social History   Socioeconomic History  . Marital status: Married    Spouse name: Not on file  . Number of children: Not on file  . Years of education: Not on file  . Highest education level: Not on file  Occupational History  . Not on file  Social Needs  . Financial resource strain: Not on file  . Food insecurity:    Worry: Not on file    Inability: Not on file  . Transportation needs:    Medical: Not on file    Non-medical: Not on file  Tobacco Use  . Smoking status: Former Smoker    Packs/day: 0.25    Years: 20.00    Pack years: 5.00    Types: Cigarettes    Last attempt to quit: 01/31/2013    Years since quitting: 5.5  . Smokeless tobacco: Never Used  Substance and Sexual Activity  . 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 Drug use: No    Types: "Crack" cocaine    Comment: 01/27/2016 "clean from smoking crack since 2007"  . Sexual activity: Yes    Birth control/protection: None, Surgical  Lifestyle  . Physical activity:    Days per week: Not on file    Minutes per session: Not on file  .  Stress: Not on file  Relationships  . Social connections:    Talks on phone: Not on file    Gets together: Not on file    Attends religious service: Not on file    Active member of club or organization: Not on file    Attends meetings of clubs or organizations: Not on file    Relationship status: Not on file  . Intimate partner violence:    Fear of current or ex partner: Not on file    Emotionally abused: Not on file    Physically abused: Not on file    Forced sexual activity: Not on file  Other Topics Concern  . Not on file  Social History Narrative  . Not on file     Allergies  Allergen Reactions  . Tomato Hives, Itching and Other (See Comments)    ALSO REACTS TO KETCHUP  . Latex  Itching and Rash  . Wool Alcohol [Lanolin] Itching     Outpatient Medications Prior to Visit  Medication Sig Dispense Refill  . acetaminophen (TYLENOL) 500 MG tablet Take 500 mg by mouth every 6 (six) hours as needed for mild pain.    Marland Kitchen albuterol (PROVENTIL HFA;VENTOLIN HFA) 108 (90 Base) MCG/ACT inhaler Inhale 2 puffs into the lungs every 6 (six) hours as needed for wheezing or shortness of breath. 1 Inhaler 6  . albuterol (PROVENTIL) (2.5 MG/3ML) 0.083% nebulizer solution Take 3 mLs (2.5 mg total) by nebulization every 6 (six) hours as needed for wheezing or shortness of breath. 75 mL 0  . aluminum sulfate-calcium acetate (DOMEBORO) packet Apply 1 packet topically 3 (three) times daily.    . budesonide (PULMICORT) 0.25 MG/2ML nebulizer solution Take 2 mLs (0.25 mg total) by nebulization 2 (two) times daily. (Patient taking differently: Take 0.25 mg by nebulization 3 (three) times daily as needed (wheezing and sob). ) 360 mL 3  . fluticasone (FLONASE) 50 MCG/ACT nasal spray Place 1 spray into both nostrils daily. (Patient taking differently: Place 1 spray into both nostrils daily as needed for allergies. ) 16 g 5  . montelukast (SINGULAIR) 10 MG tablet Take 1 tablet (10 mg total) by mouth at bedtime. 30 tablet 5  . Spacer/Aero-Holding Chambers (AEROCHAMBER MV) inhaler Use as instructed 1 each 2  . cephALEXin (KEFLEX) 500 MG capsule Take 1 capsule (500 mg total) by mouth 4 (four) times daily for 7 days. 27 capsule 0  . guaiFENesin (MUCINEX) 600 MG 12 hr tablet Take 1 tablet (600 mg total) by mouth 2 (two) times daily. (Patient not taking: Reported on 08/21/2018) 14 tablet 0  . HYDROcodone-acetaminophen (NORCO/VICODIN) 5-325 MG tablet Take 1 tablet by mouth every 6 (six) hours as needed. 6 tablet 0  . hydrOXYzine (ATARAX/VISTARIL) 25 MG tablet Take 1 tablet (25 mg total) by mouth 3 (three) times daily as needed for itching. (Patient not taking: Reported on 08/21/2018) 30 tablet 0  . methocarbamol  (ROBAXIN) 500 MG tablet Take 1 tablet (500 mg total) by mouth every 8 (eight) hours as needed for muscle spasms. (Patient not taking: Reported on 08/21/2018) 15 tablet 0  . pantoprazole (PROTONIX) 40 MG tablet Take 40 mg by mouth daily as needed (acid reflux).     Facility-Administered Medications Prior to Visit  Medication Dose Route Frequency Provider Last Rate Last Dose  . levalbuterol (XOPENEX) nebulizer solution 0.63 mg  0.63 mg Nebulization Q6H [name], MD   0.63 mg at 04/13/18 1031      Review of Systems  Constitutional: Positive for fatigue. Negative for fever.  HENT: Negative for congestion, rhinorrhea and sore throat.   Respiratory: Negative for cough and shortness of breath.   Gastrointestinal: Negative for anorexia, diarrhea and vomiting.  Musculoskeletal: Negative for joint pain.  Skin: Positive for rash.       Objective:   Physical Exam Vitals:   08/29/18 1054  BP: (!) 149/86  Pulse: 86  Resp: 18  Temp: (!) 97.5 F (36.4 C)  TempSrc: Oral  SpO2: 99%  Weight: 251 lb (113.9 kg)  Height: 5\' 6"  (1.676 m)    Gen: Pleasant, well-nourished, in no distress,  normal affect  ENT: No lesions,  mouth clear,  oropharynx clear, no postnasal drip  Neck: No JVD, no TMG, no carotid bruits  Lungs: No use of accessory muscles, no dullness to percussion, clear without rales or rhonchi  Cardiovascular: RRR, heart sounds normal, no murmur or gallops, no peripheral edema  Abdomen: soft and NT, no HSM,  BS normal  Musculoskeletal: No deformities, no cyanosis or clubbing  Neuro: alert, non focal  Skin: Warm, Large ulceration over Left lower extremity . See photo.  Partially scabbing over.  No overt purulence or signs of cellulitis    No results found. [place] controlled substance database checked and no multiple narcotic Rx given .  Only Rx are shorterm from ED and hospital visits in 07/2018 and none prior to that until 2018     Assessment & Plan:  I personally  reviewed all images and lab data in the [facility] system as well as any outside material available during this office visit and agree with the  radiology impressions.   Skin ulcer (HCC) secondary to bullous impetigo /trauma Large ulceration over LLE , partially healing.  See photograph I suspect started as trauma.  ? Impetigo bullous type lesion, may have gotten secondarily infected. Perhaps occurred after a fall injuring R knee that is site of prior TKR.  Followed by dermatology.  No bx done , doubts psoriasis Remains very painful.  Plan F/u with wound care Home health RN for dressing changes Domeboro to exposed areas for drainage Cont dressing changes Finish course of keflex reduce to 500mg  bid F/u with dermatology     Kathleen Rodriguez was seen today for hospitalization follow-up.  Diagnoses and all orders for this visit:  Skin ulcer, limited to breakdown of skin (HCC)  Other orders -     hydrOXYzine (ATARAX/VISTARIL) 25 MG tablet; Take 1 tablet (25 mg total) by mouth 3 (three) times daily as needed for itching. -     HYDROcodone-acetaminophen (NORCO/VICODIN) 5-325 MG tablet; Take 1 tablet by mouth every 6 (six) hours as needed. -     cephALEXin (KEFLEX) 500 MG capsule; Take 1 capsule (500 mg total) by mouth 2 (two) times daily for 7 days.

## 2018-08-29 NOTE — Patient Instructions (Signed)
Finish current course of Keflex but reduce to 1 capsule twice daily A short-term prescription of Norco was sent to your pharmacy A refill on the hydroxyzine was sent to the pharmacy Please keep the visit with wound care center We will give instructions to home health nursing on wound care Please follow-up with your dermatologist Continue Domoboro  to the reddened areas of the wound Return to Dr Alvis Lemmings for recheck in two weeks

## 2018-08-30 DIAGNOSIS — J449 Chronic obstructive pulmonary disease, unspecified: Secondary | ICD-10-CM | POA: Diagnosis not present

## 2018-08-30 DIAGNOSIS — Z87891 Personal history of nicotine dependence: Secondary | ICD-10-CM | POA: Diagnosis not present

## 2018-08-30 DIAGNOSIS — Z7951 Long term (current) use of inhaled steroids: Secondary | ICD-10-CM | POA: Diagnosis not present

## 2018-08-30 DIAGNOSIS — I110 Hypertensive heart disease with heart failure: Secondary | ICD-10-CM | POA: Diagnosis not present

## 2018-08-30 DIAGNOSIS — Z6841 Body Mass Index (BMI) 40.0 and over, adult: Secondary | ICD-10-CM | POA: Diagnosis not present

## 2018-08-30 DIAGNOSIS — Z96652 Presence of left artificial knee joint: Secondary | ICD-10-CM | POA: Diagnosis not present

## 2018-08-30 DIAGNOSIS — I872 Venous insufficiency (chronic) (peripheral): Secondary | ICD-10-CM | POA: Diagnosis not present

## 2018-08-30 DIAGNOSIS — J4550 Severe persistent asthma, uncomplicated: Secondary | ICD-10-CM | POA: Diagnosis not present

## 2018-08-30 DIAGNOSIS — L97229 Non-pressure chronic ulcer of left calf with unspecified severity: Secondary | ICD-10-CM | POA: Diagnosis not present

## 2018-08-30 DIAGNOSIS — G4733 Obstructive sleep apnea (adult) (pediatric): Secondary | ICD-10-CM | POA: Diagnosis not present

## 2018-08-30 DIAGNOSIS — L03116 Cellulitis of left lower limb: Secondary | ICD-10-CM | POA: Diagnosis not present

## 2018-08-30 DIAGNOSIS — Z8659 Personal history of other mental and behavioral disorders: Secondary | ICD-10-CM | POA: Diagnosis not present

## 2018-08-30 DIAGNOSIS — I509 Heart failure, unspecified: Secondary | ICD-10-CM | POA: Diagnosis not present

## 2018-08-30 DIAGNOSIS — M1990 Unspecified osteoarthritis, unspecified site: Secondary | ICD-10-CM | POA: Diagnosis not present

## 2018-09-03 ENCOUNTER — Encounter (HOSPITAL_BASED_OUTPATIENT_CLINIC_OR_DEPARTMENT_OTHER): Payer: Medicaid Other | Attending: Internal Medicine

## 2018-09-03 DIAGNOSIS — Z87891 Personal history of nicotine dependence: Secondary | ICD-10-CM | POA: Diagnosis not present

## 2018-09-03 DIAGNOSIS — I872 Venous insufficiency (chronic) (peripheral): Secondary | ICD-10-CM | POA: Diagnosis not present

## 2018-09-03 DIAGNOSIS — M1990 Unspecified osteoarthritis, unspecified site: Secondary | ICD-10-CM | POA: Diagnosis not present

## 2018-09-03 DIAGNOSIS — J449 Chronic obstructive pulmonary disease, unspecified: Secondary | ICD-10-CM | POA: Diagnosis not present

## 2018-09-03 DIAGNOSIS — J4550 Severe persistent asthma, uncomplicated: Secondary | ICD-10-CM | POA: Diagnosis not present

## 2018-09-03 DIAGNOSIS — Z7951 Long term (current) use of inhaled steroids: Secondary | ICD-10-CM | POA: Diagnosis not present

## 2018-09-03 DIAGNOSIS — Z96652 Presence of left artificial knee joint: Secondary | ICD-10-CM | POA: Diagnosis not present

## 2018-09-03 DIAGNOSIS — G4733 Obstructive sleep apnea (adult) (pediatric): Secondary | ICD-10-CM | POA: Diagnosis not present

## 2018-09-03 DIAGNOSIS — Z8659 Personal history of other mental and behavioral disorders: Secondary | ICD-10-CM | POA: Diagnosis not present

## 2018-09-03 DIAGNOSIS — I509 Heart failure, unspecified: Secondary | ICD-10-CM | POA: Diagnosis not present

## 2018-09-03 DIAGNOSIS — Z6841 Body Mass Index (BMI) 40.0 and over, adult: Secondary | ICD-10-CM | POA: Diagnosis not present

## 2018-09-03 DIAGNOSIS — I110 Hypertensive heart disease with heart failure: Secondary | ICD-10-CM | POA: Diagnosis not present

## 2018-09-03 DIAGNOSIS — L03116 Cellulitis of left lower limb: Secondary | ICD-10-CM | POA: Diagnosis not present

## 2018-09-03 DIAGNOSIS — L97229 Non-pressure chronic ulcer of left calf with unspecified severity: Secondary | ICD-10-CM | POA: Diagnosis not present

## 2018-09-04 DIAGNOSIS — L03116 Cellulitis of left lower limb: Secondary | ICD-10-CM | POA: Diagnosis not present

## 2018-09-04 DIAGNOSIS — L97229 Non-pressure chronic ulcer of left calf with unspecified severity: Secondary | ICD-10-CM | POA: Diagnosis not present

## 2018-09-04 DIAGNOSIS — Z96652 Presence of left artificial knee joint: Secondary | ICD-10-CM | POA: Diagnosis not present

## 2018-09-04 DIAGNOSIS — G4733 Obstructive sleep apnea (adult) (pediatric): Secondary | ICD-10-CM | POA: Diagnosis not present

## 2018-09-04 DIAGNOSIS — Z87891 Personal history of nicotine dependence: Secondary | ICD-10-CM | POA: Diagnosis not present

## 2018-09-04 DIAGNOSIS — Z7951 Long term (current) use of inhaled steroids: Secondary | ICD-10-CM | POA: Diagnosis not present

## 2018-09-04 DIAGNOSIS — J449 Chronic obstructive pulmonary disease, unspecified: Secondary | ICD-10-CM | POA: Diagnosis not present

## 2018-09-04 DIAGNOSIS — J4550 Severe persistent asthma, uncomplicated: Secondary | ICD-10-CM | POA: Diagnosis not present

## 2018-09-04 DIAGNOSIS — Z8659 Personal history of other mental and behavioral disorders: Secondary | ICD-10-CM | POA: Diagnosis not present

## 2018-09-04 DIAGNOSIS — I509 Heart failure, unspecified: Secondary | ICD-10-CM | POA: Diagnosis not present

## 2018-09-04 DIAGNOSIS — I110 Hypertensive heart disease with heart failure: Secondary | ICD-10-CM | POA: Diagnosis not present

## 2018-09-04 DIAGNOSIS — Z6841 Body Mass Index (BMI) 40.0 and over, adult: Secondary | ICD-10-CM | POA: Diagnosis not present

## 2018-09-04 DIAGNOSIS — M1990 Unspecified osteoarthritis, unspecified site: Secondary | ICD-10-CM | POA: Diagnosis not present

## 2018-09-04 DIAGNOSIS — I872 Venous insufficiency (chronic) (peripheral): Secondary | ICD-10-CM | POA: Diagnosis not present

## 2018-09-05 ENCOUNTER — Encounter: Payer: Self-pay | Admitting: Family Medicine

## 2018-09-05 ENCOUNTER — Ambulatory Visit: Payer: Medicaid Other | Attending: Family Medicine | Admitting: Family Medicine

## 2018-09-05 VITALS — BP 119/75 | HR 96 | Temp 98.0°F | Ht 66.0 in | Wt 259.6 lb

## 2018-09-05 DIAGNOSIS — Z96652 Presence of left artificial knee joint: Secondary | ICD-10-CM | POA: Insufficient documentation

## 2018-09-05 DIAGNOSIS — M25561 Pain in right knee: Secondary | ICD-10-CM | POA: Insufficient documentation

## 2018-09-05 DIAGNOSIS — B353 Tinea pedis: Secondary | ICD-10-CM | POA: Diagnosis not present

## 2018-09-05 DIAGNOSIS — I509 Heart failure, unspecified: Secondary | ICD-10-CM | POA: Diagnosis not present

## 2018-09-05 DIAGNOSIS — M170 Bilateral primary osteoarthritis of knee: Secondary | ICD-10-CM | POA: Insufficient documentation

## 2018-09-05 DIAGNOSIS — L98491 Non-pressure chronic ulcer of skin of other sites limited to breakdown of skin: Secondary | ICD-10-CM | POA: Diagnosis not present

## 2018-09-05 DIAGNOSIS — J4540 Moderate persistent asthma, uncomplicated: Secondary | ICD-10-CM | POA: Diagnosis not present

## 2018-09-05 DIAGNOSIS — I110 Hypertensive heart disease with heart failure: Secondary | ICD-10-CM | POA: Diagnosis not present

## 2018-09-05 DIAGNOSIS — Z79899 Other long term (current) drug therapy: Secondary | ICD-10-CM | POA: Insufficient documentation

## 2018-09-05 DIAGNOSIS — G629 Polyneuropathy, unspecified: Secondary | ICD-10-CM

## 2018-09-05 DIAGNOSIS — B3731 Acute candidiasis of vulva and vagina: Secondary | ICD-10-CM

## 2018-09-05 DIAGNOSIS — B373 Candidiasis of vulva and vagina: Secondary | ICD-10-CM | POA: Diagnosis not present

## 2018-09-05 DIAGNOSIS — G4733 Obstructive sleep apnea (adult) (pediatric): Secondary | ICD-10-CM | POA: Insufficient documentation

## 2018-09-05 MED ORDER — TERBINAFINE HCL 1 % EX CREA: 1.0000 "application " | TOPICAL_CREAM | Freq: Two times a day (BID) | CUTANEOUS | 1 refills | Status: DC

## 2018-09-05 MED ORDER — FLUCONAZOLE 150 MG PO TABS: 150.0000 mg | ORAL_TABLET | Freq: Once | ORAL | 0 refills | Status: AC

## 2018-09-05 MED ORDER — GABAPENTIN 300 MG PO CAPS: 300.0000 mg | ORAL_CAPSULE | Freq: Two times a day (BID) | ORAL | 2 refills | Status: DC

## 2018-09-05 MED ORDER — METHOCARBAMOL 500 MG PO TABS: 500.0000 mg | ORAL_TABLET | Freq: Three times a day (TID) | ORAL | 1 refills | Status: DC | PRN

## 2018-09-05 NOTE — Patient Instructions (Signed)
Focal Neuropathy  Focal neuropathy is a nerve injury that affects one area of the body, such as an arm, a leg, or the face. The injury may involve one nerve or a small group of nerves. Examples of focal neuropathy include brachial plexus injury and Parsonage-Turner syndrome.  What are the causes?  This condition may be caused by:   A sudden cut or stretch. This can happen because of an accident or during surgery.   A small injury that happens again and again.   A compression injury. This kind of injury happens when a blood vessel, a tumor, or something else presses on a nerve for a long time.   Entrapment. This can happen to a nerve that has to pass through a narrow area.   Lack of blood to the nerves. This can be caused by certain medical conditions, like diabetes or vasculitis.   Extreme cold.   Severe burns.   Radiation.    What are the signs or symptoms?  Symptoms of this condition depend on where the damaged nerve is and what kind of nerve it is. For example, damage to nerves that carry signals away from the brain can cause symptoms related to movement, but damage to nerves that carry signals to the brain can cause symptoms related to feeling and pain.  Symptoms affect only one area of the body. Common symptoms include:   Numbness.   Tingling.   A burning pain.   A prickling feeling.   Very sensitive skin.   Weakness.   Paralysis.   Muscle twitching.   Muscles getting smaller (muscle wasting).   Poor coordination.    How is this diagnosed?  This condition may be diagnosed with:   A neurologic exam. During the exam, your health care provider will check your reflexes, how you move, and what you can feel.   Tests. Tests may be done to help find the area that has been damaged. Tests may include:  ? Imaging tests, such as a CT scan or MRI.  ? Electromyogram (EMG). This test checks the nerves that control your muscles.  ? Nerve conduction velocity (NCV) tests. These tests check how quickly  messages pass through your nerves.    How is this treated?  Treatment for this condition depends on what damaged the nerve and how much of the nerve is damaged. Treatment may involve:   Surgery to ease pressure on a nerve. This may be done if you have a compression injury or entrapment.   Medicines, such as:  ? Medicines for pain, such painkillers, certain anti-seizure medicines, or antidepressants.  ? Steroid medicines. These may be given in pill form or with a shot (injection).   Physical therapy (PT) to help movement.   Splints or other devices to help movement.    Follow these instructions at home:     Take over-the-counter and prescription medicines only as told by your health care provider.   If you were given a splint or other device to help with movement, use it as told by your health care provider.   If any part of your body is numb, check it every day for signs of injury or infection. Watch for:  ? Redness, swelling, or pain.  ? Fluid or blood.  ? Warmth.  ? Pus or a bad smell.   Do not do things that put pressure on your damaged nerve. You may have to avoid certain activities or avoid sitting or lying in a certain way.     Do not smoke. Smoking keeps blood from getting to damaged nerves. If you need help quitting, ask your health care provider.   Limit alcohol intake, or avoid alcohol completely. Too much alcohol can cause a lack of B vitamins, which are needed for healthy nerves.   Have a good support system. Coping with focal neuropathy can be stressful. Talk with a mental health caregiver or join a support group if you are struggling.   Keep all follow-up visits as told by your health care provider. This is important. These include PT visits.  Contact a health care provider if:   You think you have an injury or infection.   You have new symptoms.   You are struggling emotionally from dealing with your condition.  Get help right away if:   You have severe pain that comes on  suddenly.   You develop any paralysis.   You lose feeling in any part of your body.  This information is not intended to replace advice given to you by your health care provider. Make sure you discuss any questions you have with your health care provider.  Document Released: 09/21/2015 Document Revised: 03/17/2016 Document Reviewed: 05/08/2015  Elsevier Interactive Patient Education  2018 [employer]

## 2018-09-05 NOTE — Progress Notes (Signed)
Patient states that she may have a yeast infection. 

## 2018-09-06 ENCOUNTER — Encounter: Payer: Self-pay | Admitting: Family Medicine

## 2018-09-06 LAB — VITAMIN B12: Vitamin B-12: 529 pg/mL (ref 232–1245)

## 2018-09-06 NOTE — Progress Notes (Signed)
Subjective:  Patient ID: [name], female    DOB: 1966/09/28  Age: 52 y.o. MRN: [id number]  CC: Knee Pain and Leg Pain   HPI Kathleen Rodriguez is a 52 year old female with a history of asthma, obstructive sleep apnea (on CPAP), bilateral knee osteoarthritis (status post left total knee arthroplasty in 08/2016) here for an office visit. I had last seen her one year ago and today she presents with a left lower leg large ulceration which has been present for the last 5 weeks which started out as cellulitis for which she has been on several antibiotics and this progressed to a large ulceration.She does give a history of a fall in the recent past. She saw Dr Delford Field last week and has completed her course of Keflex and is currently under the care of [facility] and reports very slow improvement in symptoms. She is currently applying Domeboro to it. She denies fever.  Her right knee is painful and TKR is pending resolution of LLE ulceration. She complains of pain in her left leg, numbness with weight bearing. She also describes it as feeling like 'she is walking on needles'. She missed her Dermatology appointment due to transportation issues but plans to reschedule. She also feels she has a yeast infection as she has itching and vaginal discharge. Asthma symptoms have been stable.  Past Medical History:  Diagnosis Date  . Arthritis    "knees, lower back; legs, ankles" (01/27/2016)  . Asthma    followed by Dr. Craige Cotta  . CHF (congestive heart failure) (HCC) 2016   "when I went into a coma"  . Cocaine abuse (HCC)   . 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 Dyspnea   . GERD (gastroesophageal reflux disease)   . Hypertension    "doctor took me off RX in 2016" (01/27/2016)  . Influenza B April 2014   Complicated by multi-organ failure  . OSA on CPAP "since " 03/20/2013  . Pneumonia 2016  . Required emergent intubation    asthma exacerbation in 2016  . Tobacco abuse   . Upper airway  cough syndrome     Past Surgical History:  Procedure Laterality Date  . BREAST SURGERY Right 1980   as a teenager , cyst was benign  . CESAREAN SECTION  2006  . LACERATION REPAIR Right ~ 1997   "tried to cut myself"  . 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 TUBAL LIGATION  2006    Allergies  Allergen Reactions  . Tomato Hives, Itching and Other (See Comments)    ALSO REACTS TO KETCHUP  . Latex Itching and Rash  . Wool Alcohol [Lanolin] Itching     Outpatient Medications Prior to Visit  Medication Sig Dispense Refill  . acetaminophen (TYLENOL) 500 MG tablet Take 500 mg by mouth every 6 (six) hours as needed for mild pain.    Marland Kitchen albuterol (PROVENTIL HFA;VENTOLIN HFA) 108 (90 Base) MCG/ACT inhaler Inhale 2 puffs into the lungs every 6 (six) hours as needed for wheezing or shortness of breath. 1 Inhaler 6  . albuterol (PROVENTIL) (2.5 MG/3ML) 0.083% nebulizer solution Take 3 mLs (2.5 mg total) by nebulization every 6 (six) hours as needed for wheezing or shortness of breath. 75 mL 0  . aluminum sulfate-calcium acetate (DOMEBORO) packet Apply 1 packet topically 3 (three) times daily.    . budesonide (  PULMICORT) 0.25 MG/2ML nebulizer solution Take 2 mLs (0.25 mg total) by nebulization 2 (two) times daily. (Patient taking differently: Take 0.25 mg by nebulization 3 (three) times daily as needed (wheezing and sob). ) 360 mL 3  . cephALEXin (KEFLEX) 500 MG capsule Take 1 capsule (500 mg total) by mouth 2 (two) times daily for 7 days. 27 capsule 0  . fluticasone (FLONASE) 50 MCG/ACT nasal spray Place 1 spray into both nostrils daily. (Patient taking differently: Place 1 spray into both nostrils daily as needed for allergies. ) 16 g 5  . hydrOXYzine (ATARAX/VISTARIL) 25 MG tablet Take 1 tablet (25 mg total) by mouth 3 (three) times daily as needed for itching. 30  tablet 1  . montelukast (SINGULAIR) 10 MG tablet Take 1 tablet (10 mg total) by mouth at bedtime. 30 tablet 5  . Spacer/Aero-Holding Chambers (AEROCHAMBER MV) inhaler Use as instructed 1 each 2  . HYDROcodone-acetaminophen (NORCO/VICODIN) 5-325 MG tablet Take 1 tablet by mouth every 6 (six) hours as needed. (Patient not taking: Reported on 09/05/2018) 10 tablet 0   Facility-Administered Medications Prior to Visit  Medication Dose Route Frequency Provider Last Rate Last Dose  . levalbuterol (XOPENEX) nebulizer solution 0.63 mg  0.63 mg Nebulization Q6H Coralyn HellingSood, Vineet, MD   0.63 mg at 04/13/18 1031    ROS Review of Systems  Constitutional: Negative for activity change, appetite change and fatigue.  HENT: Negative for congestion, sinus pressure and sore throat.   Eyes: Negative for visual disturbance.  Respiratory: Negative for cough, chest tightness, shortness of breath and wheezing.   Cardiovascular: Negative for chest pain and palpitations.  Gastrointestinal: Negative for abdominal distention, abdominal pain and constipation.  Endocrine: Negative for polydipsia.  Genitourinary: Negative for dysuria and frequency.  Musculoskeletal:       See hpi  Skin: Negative for rash.       See hpi  Neurological: Negative for tremors, light-headedness and numbness.  Hematological: Does not bruise/bleed easily.  Psychiatric/Behavioral: Negative for agitation and behavioral problems.    Objective:  BP 119/75   Pulse 96   Temp 98 F (36.7 C) (Oral)   Ht 5\' 6"  (1.676 m)   Wt 259 lb 9.6 oz (117.8 kg)   SpO2 99%   BMI 41.90 kg/m   BP/Weight 09/05/2018 08/29/2018 08/22/2018  Systolic BP 119 149 133  Diastolic BP 75 86 104  Wt. (Lbs) 259.6 251 -  BMI 41.9 40.51 -      Physical Exam  Constitutional: She is oriented to person, place, and time. She appears well-developed and well-nourished.  Cardiovascular: Normal rate, normal heart sounds and intact distal pulses.  No murmur  heard. Pulmonary/Chest: Effort normal and breath sounds normal. She has no wheezes. She has no rales. She exhibits no tenderness.  Abdominal: Soft. Bowel sounds are normal. She exhibits no distension and no mass. There is no tenderness.  Musculoskeletal:  TTP and on ROM of R knee  Neurological: She is alert and oriented to person, place, and time.  Skin:  LLE with large ulceration over lower third of leg, central erythema with some scabbing no surrounding erythema or edema,no discharge.  Psychiatric: She has a normal mood and affect.     Assessment & Plan:   1. 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  Dispense: 60 capsule; Refill: 2 - methocarbamol (ROBAXIN) 500 MG tablet; Take  1 tablet (500 mg total) by mouth every 8 (eight) hours as needed for muscle spasms.  Dispense: 60 tablet; Refill: 1 - Vitamin B12  2. Primary osteoarthritis of both knees S/l L TKR Needs R TKR but this is on hold due to left lower extremity ulcer  3. Vaginal candidiasis Due to recent antibiotic use - fluconazole (DIFLUCAN) 150 MG tablet; Take 1 tablet (150 mg total) by mouth once for 1 dose.  Dispense: 1 tablet; Refill: 0  4. Tinea pedis of right foot Advised to be cautious when applying cream to prevent transmission to hands - terbinafine (LAMISIL AT) 1 % cream; Apply 1 application topically 2 (two) times daily.  Dispense: 30 g; Refill: 1  5. Moderate persistent asthma without complication No acute exacerbation  6. Skin ulcer limited to breakdown of skin Slowly improving Continue Domeboro application Will likely need skin biopsy Followed by [facility] and will need to reschedule appointment  Meds ordered this encounter  Medications  . fluconazole (DIFLUCAN) 150 MG tablet    Sig: Take 1 tablet (150 mg total) by mouth once for 1 dose.    Dispense:  1 tablet     Refill:  0  . gabapentin (NEURONTIN) 300 MG capsule    Sig: Take 1 capsule (300 mg total) by mouth 2 (two) times daily.    Dispense:  60 capsule    Refill:  2  . terbinafine (LAMISIL AT) 1 % cream    Sig: Apply 1 application topically 2 (two) times daily.    Dispense:  30 g    Refill:  1  . methocarbamol (ROBAXIN) 500 MG tablet    Sig: Take 1 tablet (500 mg total) by mouth every 8 (eight) hours as needed for muscle spasms.    Dispense:  60 tablet    Refill:  1    Follow-up: Return in about 3 weeks (around 09/26/2018) for follow up of left lower extremity cellulitis.   Hoy Register MD

## 2018-09-07 ENCOUNTER — Telehealth: Payer: Self-pay

## 2018-09-07 NOTE — Telephone Encounter (Signed)
Patient was called and a voicemail was left informing patient to return phone call for lab results.    If patient returns phone call please inform patient of normal lab results below.

## 2018-09-07 NOTE — Telephone Encounter (Signed)
-----   Message from Hoy RegisterEnobong Newlin, MD sent at 09/06/2018  6:38 AM EST ----- Please inform the patient that labs are normal. Thank you.

## 2018-09-10 ENCOUNTER — Telehealth: Payer: Self-pay | Admitting: Pulmonary Disease

## 2018-09-10 DIAGNOSIS — Z6841 Body Mass Index (BMI) 40.0 and over, adult: Secondary | ICD-10-CM | POA: Diagnosis not present

## 2018-09-10 DIAGNOSIS — G4733 Obstructive sleep apnea (adult) (pediatric): Secondary | ICD-10-CM | POA: Diagnosis not present

## 2018-09-10 DIAGNOSIS — J4550 Severe persistent asthma, uncomplicated: Secondary | ICD-10-CM | POA: Diagnosis not present

## 2018-09-10 DIAGNOSIS — L03116 Cellulitis of left lower limb: Secondary | ICD-10-CM | POA: Diagnosis not present

## 2018-09-10 DIAGNOSIS — I110 Hypertensive heart disease with heart failure: Secondary | ICD-10-CM | POA: Diagnosis not present

## 2018-09-10 DIAGNOSIS — Z7951 Long term (current) use of inhaled steroids: Secondary | ICD-10-CM | POA: Diagnosis not present

## 2018-09-10 DIAGNOSIS — Z8659 Personal history of other mental and behavioral disorders: Secondary | ICD-10-CM | POA: Diagnosis not present

## 2018-09-10 DIAGNOSIS — Z96652 Presence of left artificial knee joint: Secondary | ICD-10-CM | POA: Diagnosis not present

## 2018-09-10 DIAGNOSIS — I509 Heart failure, unspecified: Secondary | ICD-10-CM | POA: Diagnosis not present

## 2018-09-10 DIAGNOSIS — M1990 Unspecified osteoarthritis, unspecified site: Secondary | ICD-10-CM | POA: Diagnosis not present

## 2018-09-10 DIAGNOSIS — J449 Chronic obstructive pulmonary disease, unspecified: Secondary | ICD-10-CM | POA: Diagnosis not present

## 2018-09-10 DIAGNOSIS — L97229 Non-pressure chronic ulcer of left calf with unspecified severity: Secondary | ICD-10-CM | POA: Diagnosis not present

## 2018-09-10 DIAGNOSIS — Z87891 Personal history of nicotine dependence: Secondary | ICD-10-CM | POA: Diagnosis not present

## 2018-09-10 DIAGNOSIS — I872 Venous insufficiency (chronic) (peripheral): Secondary | ICD-10-CM | POA: Diagnosis not present

## 2018-09-12 DIAGNOSIS — J188 Other pneumonia, unspecified organism: Secondary | ICD-10-CM | POA: Diagnosis not present

## 2018-09-12 DIAGNOSIS — G4733 Obstructive sleep apnea (adult) (pediatric): Secondary | ICD-10-CM | POA: Diagnosis not present

## 2018-09-12 DIAGNOSIS — N179 Acute kidney failure, unspecified: Secondary | ICD-10-CM | POA: Diagnosis not present

## 2018-09-12 DIAGNOSIS — J45901 Unspecified asthma with (acute) exacerbation: Secondary | ICD-10-CM | POA: Diagnosis not present

## 2018-09-12 DIAGNOSIS — Z96659 Presence of unspecified artificial knee joint: Secondary | ICD-10-CM | POA: Diagnosis not present

## 2018-09-12 DIAGNOSIS — R05 Cough: Secondary | ICD-10-CM | POA: Diagnosis not present

## 2018-09-12 DIAGNOSIS — J9690 Respiratory failure, unspecified, unspecified whether with hypoxia or hypercapnia: Secondary | ICD-10-CM | POA: Diagnosis not present

## 2018-09-12 DIAGNOSIS — J45909 Unspecified asthma, uncomplicated: Secondary | ICD-10-CM | POA: Diagnosis not present

## 2018-09-12 DIAGNOSIS — M1712 Unilateral primary osteoarthritis, left knee: Secondary | ICD-10-CM | POA: Diagnosis not present

## 2018-09-12 DIAGNOSIS — L03119 Cellulitis of unspecified part of limb: Secondary | ICD-10-CM | POA: Diagnosis not present

## 2018-09-12 DIAGNOSIS — R269 Unspecified abnormalities of gait and mobility: Secondary | ICD-10-CM | POA: Diagnosis not present

## 2018-09-12 DIAGNOSIS — J4551 Severe persistent asthma with (acute) exacerbation: Secondary | ICD-10-CM | POA: Diagnosis not present

## 2018-09-12 NOTE — Telephone Encounter (Signed)
Verbal orders for wound care and education for 3 weeks 1 visit per week please call [phone] [facility] Selena BattenKim it is ok to leave a VM.

## 2018-09-13 ENCOUNTER — Ambulatory Visit: Payer: Medicaid Other | Admitting: Family Medicine

## 2018-09-14 NOTE — Telephone Encounter (Signed)
Kathleen Rodriguez was called and given verbal orders for wound care and education.

## 2018-09-21 ENCOUNTER — Telehealth: Payer: Self-pay | Admitting: Pulmonary Disease

## 2018-09-21 NOTE — Telephone Encounter (Signed)
I am sorry that the patient is not feeling well.  Unfortunately patient's cellulitis appears to have been managed by primary care she needs to keep follow-up with their office and contact them regarding her cellulitis.  Patient has continued to not follow-up with our office regarding her asthma.  She continues to not make her appointments.  Due to her cellulitis I will not start prednisone to suppress her immune system as she tries to heal from her cellulitis.  If patient would like further evaluation I recommend that she present to primary care, urgent care, or the emergency room for further evaluation.  Please ensure the patient has a scheduled follow-up with our office in the next 1 to 2 weeks to further address her asthma management.  Patient needs to follow-up with primary care regarding her cellulitis.  Elisha HeadlandBrian Brittanny Levenhagen FNP

## 2018-09-21 NOTE — Telephone Encounter (Signed)
Called and spoke with patient, she stated that she is having tightness in her chest and she is out of her inhaler for asthma. She is requesting prednisone to be called in as well as she has a diagnosis of cellulitis. Patient is requesting this be called in today.    Kathleen Rodriguez please advise, thank you.

## 2018-09-21 NOTE — Telephone Encounter (Signed)
Called patient unable to reach left message to give us a call back.

## 2018-09-21 NOTE — Telephone Encounter (Signed)
Called patient, unable to reach left message to give us a call back. 

## 2018-09-23 DIAGNOSIS — R269 Unspecified abnormalities of gait and mobility: Secondary | ICD-10-CM | POA: Diagnosis not present

## 2018-09-23 DIAGNOSIS — N179 Acute kidney failure, unspecified: Secondary | ICD-10-CM | POA: Diagnosis not present

## 2018-09-23 DIAGNOSIS — J9690 Respiratory failure, unspecified, unspecified whether with hypoxia or hypercapnia: Secondary | ICD-10-CM | POA: Diagnosis not present

## 2018-09-23 DIAGNOSIS — J45909 Unspecified asthma, uncomplicated: Secondary | ICD-10-CM | POA: Diagnosis not present

## 2018-09-23 DIAGNOSIS — J4551 Severe persistent asthma with (acute) exacerbation: Secondary | ICD-10-CM | POA: Diagnosis not present

## 2018-09-23 DIAGNOSIS — Z96659 Presence of unspecified artificial knee joint: Secondary | ICD-10-CM | POA: Diagnosis not present

## 2018-09-23 DIAGNOSIS — J188 Other pneumonia, unspecified organism: Secondary | ICD-10-CM | POA: Diagnosis not present

## 2018-09-23 DIAGNOSIS — L03119 Cellulitis of unspecified part of limb: Secondary | ICD-10-CM | POA: Diagnosis not present

## 2018-09-23 DIAGNOSIS — M1712 Unilateral primary osteoarthritis, left knee: Secondary | ICD-10-CM | POA: Diagnosis not present

## 2018-09-23 DIAGNOSIS — J45998 Other asthma: Secondary | ICD-10-CM | POA: Diagnosis not present

## 2018-09-23 DIAGNOSIS — G4733 Obstructive sleep apnea (adult) (pediatric): Secondary | ICD-10-CM | POA: Diagnosis not present

## 2018-09-23 DIAGNOSIS — J45901 Unspecified asthma with (acute) exacerbation: Secondary | ICD-10-CM | POA: Diagnosis not present

## 2018-09-23 DIAGNOSIS — R05 Cough: Secondary | ICD-10-CM | POA: Diagnosis not present

## 2018-09-24 NOTE — Telephone Encounter (Signed)
Attempted to call pt but unable to reach her. Left message for pt to return call. 

## 2018-09-25 NOTE — Telephone Encounter (Signed)
Spoke with patient-states she is under her PCP care for Cellulitis and nothing more needed at this time.

## 2018-09-27 ENCOUNTER — Ambulatory Visit (INDEPENDENT_AMBULATORY_CARE_PROVIDER_SITE_OTHER): Payer: Medicaid Other | Admitting: Orthopaedic Surgery

## 2018-10-01 ENCOUNTER — Telehealth: Payer: Self-pay | Admitting: Family Medicine

## 2018-10-01 DIAGNOSIS — Z8659 Personal history of other mental and behavioral disorders: Secondary | ICD-10-CM | POA: Diagnosis not present

## 2018-10-01 DIAGNOSIS — Z87891 Personal history of nicotine dependence: Secondary | ICD-10-CM | POA: Diagnosis not present

## 2018-10-01 DIAGNOSIS — L97229 Non-pressure chronic ulcer of left calf with unspecified severity: Secondary | ICD-10-CM | POA: Diagnosis not present

## 2018-10-01 DIAGNOSIS — I509 Heart failure, unspecified: Secondary | ICD-10-CM | POA: Diagnosis not present

## 2018-10-01 DIAGNOSIS — G4733 Obstructive sleep apnea (adult) (pediatric): Secondary | ICD-10-CM | POA: Diagnosis not present

## 2018-10-01 DIAGNOSIS — Z7951 Long term (current) use of inhaled steroids: Secondary | ICD-10-CM | POA: Diagnosis not present

## 2018-10-01 DIAGNOSIS — I110 Hypertensive heart disease with heart failure: Secondary | ICD-10-CM | POA: Diagnosis not present

## 2018-10-01 DIAGNOSIS — I872 Venous insufficiency (chronic) (peripheral): Secondary | ICD-10-CM | POA: Diagnosis not present

## 2018-10-01 DIAGNOSIS — Z96652 Presence of left artificial knee joint: Secondary | ICD-10-CM | POA: Diagnosis not present

## 2018-10-01 DIAGNOSIS — Z6841 Body Mass Index (BMI) 40.0 and over, adult: Secondary | ICD-10-CM | POA: Diagnosis not present

## 2018-10-01 DIAGNOSIS — M1990 Unspecified osteoarthritis, unspecified site: Secondary | ICD-10-CM | POA: Diagnosis not present

## 2018-10-01 DIAGNOSIS — J4550 Severe persistent asthma, uncomplicated: Secondary | ICD-10-CM | POA: Diagnosis not present

## 2018-10-01 DIAGNOSIS — L03116 Cellulitis of left lower limb: Secondary | ICD-10-CM | POA: Diagnosis not present

## 2018-10-01 DIAGNOSIS — J449 Chronic obstructive pulmonary disease, unspecified: Secondary | ICD-10-CM | POA: Diagnosis not present

## 2018-10-01 NOTE — Telephone Encounter (Signed)
[facility] called just to inform Nurse that patient has denied treatment on Thursday 09/27/2018, and has not been available to reschedule her Home health visits.

## 2018-10-01 NOTE — Telephone Encounter (Signed)
Will route to PCP for review. 

## 2018-10-02 NOTE — Telephone Encounter (Signed)
Noted  

## 2018-10-09 ENCOUNTER — Ambulatory Visit: Payer: Medicaid Other | Admitting: Family Medicine

## 2018-10-24 DIAGNOSIS — N179 Acute kidney failure, unspecified: Secondary | ICD-10-CM | POA: Diagnosis not present

## 2018-10-24 DIAGNOSIS — M1712 Unilateral primary osteoarthritis, left knee: Secondary | ICD-10-CM | POA: Diagnosis not present

## 2018-10-24 DIAGNOSIS — J4551 Severe persistent asthma with (acute) exacerbation: Secondary | ICD-10-CM | POA: Diagnosis not present

## 2018-10-24 DIAGNOSIS — R05 Cough: Secondary | ICD-10-CM | POA: Diagnosis not present

## 2018-10-24 DIAGNOSIS — J188 Other pneumonia, unspecified organism: Secondary | ICD-10-CM | POA: Diagnosis not present

## 2018-10-24 DIAGNOSIS — L03119 Cellulitis of unspecified part of limb: Secondary | ICD-10-CM | POA: Diagnosis not present

## 2018-10-24 DIAGNOSIS — J45909 Unspecified asthma, uncomplicated: Secondary | ICD-10-CM | POA: Diagnosis not present

## 2018-10-24 DIAGNOSIS — R269 Unspecified abnormalities of gait and mobility: Secondary | ICD-10-CM | POA: Diagnosis not present

## 2018-10-24 DIAGNOSIS — J45998 Other asthma: Secondary | ICD-10-CM | POA: Diagnosis not present

## 2018-10-24 DIAGNOSIS — G4733 Obstructive sleep apnea (adult) (pediatric): Secondary | ICD-10-CM | POA: Diagnosis not present

## 2018-10-24 DIAGNOSIS — J9690 Respiratory failure, unspecified, unspecified whether with hypoxia or hypercapnia: Secondary | ICD-10-CM | POA: Diagnosis not present

## 2018-10-24 DIAGNOSIS — J45901 Unspecified asthma with (acute) exacerbation: Secondary | ICD-10-CM | POA: Diagnosis not present

## 2018-10-24 DIAGNOSIS — Z96659 Presence of unspecified artificial knee joint: Secondary | ICD-10-CM | POA: Diagnosis not present

## 2018-11-02 ENCOUNTER — Ambulatory Visit: Payer: Medicaid Other | Admitting: Pulmonary Disease

## 2018-11-07 ENCOUNTER — Encounter: Payer: Self-pay | Admitting: Family Medicine

## 2018-11-07 ENCOUNTER — Other Ambulatory Visit (HOSPITAL_COMMUNITY)
Admission: RE | Admit: 2018-11-07 | Discharge: 2018-11-07 | Disposition: A | Discharge status: Home / Self Care | Payer: Medicaid Other | Source: Ambulatory Visit | Attending: Family Medicine | Admitting: Family Medicine

## 2018-11-07 ENCOUNTER — Ambulatory Visit: Payer: Medicaid Other | Attending: Family Medicine | Admitting: Family Medicine

## 2018-11-07 VITALS — BP 108/76 | HR 79 | Temp 98.3°F | Ht 66.0 in | Wt 252.8 lb

## 2018-11-07 DIAGNOSIS — M1990 Unspecified osteoarthritis, unspecified site: Secondary | ICD-10-CM | POA: Insufficient documentation

## 2018-11-07 DIAGNOSIS — Z0000 Encounter for general adult medical examination without abnormal findings: Secondary | ICD-10-CM

## 2018-11-07 DIAGNOSIS — K089 Disorder of teeth and supporting structures, unspecified: Secondary | ICD-10-CM | POA: Insufficient documentation

## 2018-11-07 DIAGNOSIS — N951 Menopausal and female climacteric states: Secondary | ICD-10-CM | POA: Insufficient documentation

## 2018-11-07 DIAGNOSIS — Z91018 Allergy to other foods: Secondary | ICD-10-CM | POA: Diagnosis not present

## 2018-11-07 DIAGNOSIS — Z124 Encounter for screening for malignant neoplasm of cervix: Secondary | ICD-10-CM | POA: Diagnosis not present

## 2018-11-07 DIAGNOSIS — Z79899 Other long term (current) drug therapy: Secondary | ICD-10-CM | POA: Diagnosis not present

## 2018-11-07 DIAGNOSIS — J45909 Unspecified asthma, uncomplicated: Secondary | ICD-10-CM | POA: Diagnosis not present

## 2018-11-07 DIAGNOSIS — R234 Changes in skin texture: Secondary | ICD-10-CM | POA: Diagnosis not present

## 2018-11-07 DIAGNOSIS — G4733 Obstructive sleep apnea (adult) (pediatric): Secondary | ICD-10-CM | POA: Diagnosis not present

## 2018-11-07 DIAGNOSIS — Z91040 Latex allergy status: Secondary | ICD-10-CM | POA: Insufficient documentation

## 2018-11-07 DIAGNOSIS — I110 Hypertensive heart disease with heart failure: Secondary | ICD-10-CM | POA: Insufficient documentation

## 2018-11-07 DIAGNOSIS — L03116 Cellulitis of left lower limb: Secondary | ICD-10-CM | POA: Diagnosis not present

## 2018-11-07 DIAGNOSIS — Z0001 Encounter for general adult medical examination with abnormal findings: Secondary | ICD-10-CM | POA: Diagnosis not present

## 2018-11-07 DIAGNOSIS — Z1239 Encounter for other screening for malignant neoplasm of breast: Secondary | ICD-10-CM | POA: Diagnosis not present

## 2018-11-07 DIAGNOSIS — R232 Flushing: Secondary | ICD-10-CM | POA: Diagnosis not present

## 2018-11-07 DIAGNOSIS — K009 Disorder of tooth development, unspecified: Secondary | ICD-10-CM

## 2018-11-07 DIAGNOSIS — K219 Gastro-esophageal reflux disease without esophagitis: Secondary | ICD-10-CM | POA: Insufficient documentation

## 2018-11-07 DIAGNOSIS — K0381 Cracked tooth: Secondary | ICD-10-CM | POA: Diagnosis not present

## 2018-11-07 MED ORDER — CLONIDINE HCL 0.1 MG PO TABS: 0.1000 mg | ORAL_TABLET | Freq: Every day | ORAL | 3 refills | Status: DC

## 2018-11-07 MED ORDER — CEPHALEXIN 500 MG PO CAPS: 500.0000 mg | ORAL_CAPSULE | Freq: Two times a day (BID) | ORAL | 0 refills | Status: DC

## 2018-11-07 NOTE — Progress Notes (Signed)
Subjective:  Patient ID: Kathleen Rodriguez, female    DOB: [birth year]  Age: 53 y.o. MRN: [id number]  CC: Annual Exam and Gynecologic Exam   HPI [name] presents for complete physical exam. She is also concerned about cracking of the soles of her feet and she has developed new bumps on her left lower extremity where she previously was treated for cellulitis. She complains of hot flashes which occur most nights of the week and is also requesting a referral to the dentist as she has partially broken teeth.  Past Medical History:  Diagnosis Date  . Arthritis    "knees, lower back; legs, ankles" (01/27/2016)  . Asthma    followed by Dr. Halford Chessman  . CHF (congestive heart failure) ([place]) 2016   "when I went into a coma"  . Cocaine abuse ([place])   . 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 Dyspnea   . GERD (gastroesophageal reflux disease)   . Hypertension    "doctor took me off RX in 2016" (01/27/2016)  . Influenza B April 3646   Complicated by multi-organ failure  . OSA on CPAP "since " 03/20/2013  . Pneumonia 2016  . Required emergent intubation    asthma exacerbation in 2016  . Tobacco abuse   . Upper airway cough syndrome     Past Surgical History:  Procedure Laterality Date  . [place]   as a teenager , cyst was benign  . CESAREAN SECTION  2006  . LACERATION REPAIR Right ~ 1997   "tried to cut myself"  . 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 TUBAL LIGATION  2006    Allergies  Allergen Reactions  . Tomato Hives, Itching and Other (See Comments)    ALSO REACTS TO KETCHUP  . Latex Itching and Rash  . Wool Alcohol [Lanolin] Itching      Outpatient Medications Prior to Visit  Medication Sig Dispense Refill  . acetaminophen (TYLENOL) 500 MG tablet Take 500 mg by mouth every 6 (six) hours as needed for mild pain.    Marland Kitchen  albuterol (PROVENTIL HFA;VENTOLIN HFA) 108 (90 Base) MCG/ACT inhaler Inhale 2 puffs into the lungs every 6 (six) hours as needed for wheezing or shortness of breath. 1 Inhaler 6  . albuterol (PROVENTIL) (2.5 MG/3ML) 0.083% nebulizer solution Take 3 mLs (2.5 mg total) by nebulization every 6 (six) hours as needed for wheezing or shortness of breath. 75 mL 0  . aluminum sulfate-calcium acetate (DOMEBORO) packet Apply 1 packet topically 3 (three) times daily.    . budesonide (PULMICORT) 0.25 MG/2ML nebulizer solution Take 2 mLs (0.25 mg total) by nebulization 2 (two) times daily. (Patient taking differently: Take 0.25 mg by nebulization 3 (three) times daily as needed (wheezing and sob). ) 360 mL 3  . fluticasone (FLONASE) 50 MCG/ACT nasal spray Place 1 spray into both nostrils daily. (Patient taking differently: Place 1 spray into both nostrils daily as needed for allergies. ) 16 g 5  . gabapentin (NEURONTIN) 300 MG capsule Take 1 capsule (300 mg total) by mouth 2 (two) times daily. 60 capsule 2  . hydrOXYzine (ATARAX/VISTARIL) 25 MG tablet Take 1 tablet (25 mg total) by mouth 3 (three) times daily as needed for itching. 30 tablet 1  . methocarbamol (ROBAXIN) 500 MG tablet Take 1 tablet (500 mg total) by mouth every 8 (  eight) hours as needed for muscle spasms. 60 tablet 1  . Spacer/Aero-Holding Chambers (AEROCHAMBER MV) inhaler Use as instructed 1 each 2  . HYDROcodone-acetaminophen (NORCO/VICODIN) 5-325 MG tablet Take 1 tablet by mouth every 6 (six) hours as needed. (Patient not taking: Reported on 09/05/2018) 10 tablet 0  . montelukast (SINGULAIR) 10 MG tablet Take 1 tablet (10 mg total) by mouth at bedtime. (Patient not taking: Reported on 11/07/2018) 30 tablet 5  . terbinafine (LAMISIL AT) 1 % cream Apply 1 application topically 2 (two) times daily. (Patient not taking: Reported on 11/07/2018) 30 g 1   Facility-Administered Medications Prior to Visit  Medication Dose Route Frequency Provider Last Rate  Last Dose  . levalbuterol (XOPENEX) nebulizer solution 0.63 mg  0.63 mg Nebulization Q6H Chesley Mires, MD   0.63 mg at 04/13/18 1031    ROS Review of Systems  Constitutional: Negative for activity change, appetite change and fatigue.  HENT: Negative for congestion, sinus pressure and sore throat.   Eyes: Negative for visual disturbance.  Respiratory: Negative for cough, chest tightness, shortness of breath and wheezing.   Cardiovascular: Negative for chest pain and palpitations.  Gastrointestinal: Negative for abdominal distention, abdominal pain and constipation.  Endocrine: Negative for polydipsia.  Genitourinary: Negative for dysuria and frequency.  Musculoskeletal: Negative for arthralgias and back pain.  Skin: Negative for rash.       See hpi  Neurological: Negative for tremors, light-headedness and numbness.  Hematological: Does not bruise/bleed easily.  Psychiatric/Behavioral: Negative for agitation and behavioral problems.    Objective:  BP 108/76   Pulse 79   Temp 98.3 F (36.8 C) (Oral)   Ht '5\' 6"'  (1.676 m)   Wt 252 lb 12.8 oz (114.7 kg)   SpO2 98%   BMI 40.80 kg/m   BP/Weight 11/07/2018 09/05/2018 51/05/8415  Systolic BP 606 301 601  Diastolic BP 76 75 86  Wt. (Lbs) 252.8 259.6 251  BMI 40.8 41.9 40.51      Physical Exam Constitutional:      General: She is not in acute distress.    Appearance: She is well-developed. She is not diaphoretic.  HENT:     Head: Normocephalic.     Right Ear: External ear normal.     Left Ear: External ear normal.     Nose: Nose normal.  Eyes:     Conjunctiva/sclera: Conjunctivae normal.     Pupils: Pupils are equal, round, and reactive to light.  Neck:     Musculoskeletal: Normal range of motion.     Vascular: No JVD.  Cardiovascular:     Rate and Rhythm: Normal rate and regular rhythm.     Heart sounds: Normal heart sounds. No murmur. No gallop.   Pulmonary:     Effort: Pulmonary effort is normal. No respiratory  distress.     Breath sounds: Normal breath sounds. No wheezing or rales.  Chest:     Chest wall: No tenderness.     Breasts:        Right: Normal. No mass or nipple discharge.        Left: Normal. No mass or nipple discharge.  Abdominal:     General: Bowel sounds are normal. There is no distension.     Palpations: Abdomen is soft. There is no mass.     Tenderness: There is no abdominal tenderness.  Genitourinary:    Comments: Female genitalia, vagina, cervix-normal Musculoskeletal: Normal range of motion.        General: No tenderness.  Skin:    Comments: Hyperpigmented circumferential patch on lower half of left leg with scattered papules on posterior aspect Extensive excoriation and fissuring of the soles of both feet  Neurological:     Mental Status: She is alert and oriented to person, place, and time.     Deep Tendon Reflexes: Reflexes are normal and symmetric.  Psychiatric:        Mood and Affect: Mood normal.        Behavior: Behavior normal.      Assessment & Plan:   1. Annual physical exam Counseled on 150 minutes of exercise per week, healthy eating (including decreased daily intake of saturated fats, cholesterol, added sugars, sodium), STI prevention, routine healthcare maintenance. - CMP14+EGFR - Lipid panel - CBC with Differential/Platelet  2. Screening for cervical cancer - Cytology - PAP([facility])  3. Screening for breast cancer - MM Digital Screening; Future  4. Cellulitis of left lower extremity Advised to schedule appointment with dermatologist - cephALEXin (KEFLEX) 500 MG capsule; Take 1 capsule (500 mg total) by mouth 2 (two) times daily.  Dispense: 20 capsule; Refill: 0  5. Hot flashes - cloNIDine (CATAPRES) 0.1 MG tablet; Take 1 tablet (0.1 mg total) by mouth at bedtime. For hot flashes  Dispense: 90 tablet; Refill: 3  6. Dental anomaly - Ambulatory referral to Dentistry   Meds ordered this encounter  Medications  . cloNIDine (CATAPRES)  0.1 MG tablet    Sig: Take 1 tablet (0.1 mg total) by mouth at bedtime. For hot flashes    Dispense:  90 tablet    Refill:  3  . cephALEXin (KEFLEX) 500 MG capsule    Sig: Take 1 capsule (500 mg total) by mouth 2 (two) times daily.    Dispense:  20 capsule    Refill:  0    Follow-up: No follow-ups on file.   [name] MD

## 2018-11-08 LAB — CMP14+EGFR
A/G RATIO: 1.5 (ref 1.2–2.2)
ALBUMIN: 3.8 g/dL (ref 3.5–5.5)
ALK PHOS: 59 IU/L (ref 39–117)
ALT: 13 IU/L (ref 0–32)
AST: 15 IU/L (ref 0–40)
BILIRUBIN TOTAL: 0.3 mg/dL (ref 0.0–1.2)
BUN / CREAT RATIO: 17 (ref 9–23)
BUN: 13 mg/dL (ref 6–24)
CHLORIDE: 102 mmol/L (ref 96–106)
CO2: 24 mmol/L (ref 20–29)
Calcium: 8.9 mg/dL (ref 8.7–10.2)
Creatinine, Ser: 0.76 mg/dL (ref 0.57–1.00)
GFR calc Af Amer: 104 mL/min/{1.73_m2} (ref >59)
GFR calc non Af Amer: 90 mL/min/{1.73_m2} (ref >59)
GLOBULIN, TOTAL: 2.6 g/dL (ref 1.5–4.5)
Glucose: 74 mg/dL (ref 65–99)
Potassium: 4.3 mmol/L (ref 3.5–5.2)
SODIUM: 140 mmol/L (ref 134–144)
Total Protein: 6.4 g/dL (ref 6.0–8.5)

## 2018-11-08 LAB — CBC WITH DIFFERENTIAL/PLATELET
BASOS ABS: 0.1 10*3/uL (ref 0.0–0.2)
Basos: 1 %
EOS (ABSOLUTE): 0.3 10*3/uL (ref 0.0–0.4)
Eos: 6 %
Hematocrit: 42.2 % (ref 34.0–46.6)
Hemoglobin: 13.3 g/dL (ref 11.1–15.9)
IMMATURE GRANS (ABS): 0 10*3/uL (ref 0.0–0.1)
IMMATURE GRANULOCYTES: 0 %
LYMPHS: 49 %
Lymphocytes Absolute: 2.6 10*3/uL (ref 0.7–3.1)
MCH: 27.4 pg (ref 26.6–33.0)
MCHC: 31.5 g/dL (ref 31.5–35.7)
MCV: 87 fL (ref 79–97)
Monocytes Absolute: 0.6 10*3/uL (ref 0.1–0.9)
Monocytes: 12 %
NEUTROS PCT: 32 %
Neutrophils Absolute: 1.7 10*3/uL (ref 1.4–7.0)
PLATELETS: 347 10*3/uL (ref 150–450)
RBC: 4.85 x10E6/uL (ref 3.77–5.28)
RDW: 15.2 % (ref 11.7–15.4)
WBC: 5.2 10*3/uL (ref 3.4–10.8)

## 2018-11-08 LAB — LIPID PANEL
CHOLESTEROL TOTAL: 172 mg/dL (ref 100–199)
Chol/HDL Ratio: 3.1 ratio (ref 0.0–4.4)
HDL: 56 mg/dL (ref >39)
LDL CALC: 98 mg/dL (ref 0–99)
Triglycerides: 89 mg/dL (ref 0–149)
VLDL Cholesterol Cal: 18 mg/dL (ref 5–40)

## 2018-11-09 ENCOUNTER — Telehealth: Payer: Self-pay

## 2018-11-09 NOTE — Telephone Encounter (Signed)
-----   Message from Hoy Register, MD sent at 11/08/2018  4:58 PM EST ----- Please inform the patient that labs are normal. Thank you.

## 2018-11-09 NOTE — Telephone Encounter (Signed)
Patient was called and informed of lab results. 

## 2018-11-12 ENCOUNTER — Telehealth: Payer: Self-pay | Admitting: Family Medicine

## 2018-11-12 DIAGNOSIS — G629 Polyneuropathy, unspecified: Secondary | ICD-10-CM

## 2018-11-12 LAB — CYTOLOGY - PAP
Adequacy: ABSENT
Diagnosis: NEGATIVE
HPV: NOT DETECTED

## 2018-11-12 MED ORDER — TRAMADOL HCL 50 MG PO TABS: 50.0000 mg | ORAL_TABLET | Freq: Two times a day (BID) | ORAL | 0 refills | Status: DC | PRN

## 2018-11-12 NOTE — Telephone Encounter (Signed)
Will route to PCP for review. 

## 2018-11-12 NOTE — Telephone Encounter (Signed)
Patient called because she is having Feet and knee pain and is wanting to know if she can be prescribed something for the pain until her appointment. Please follow up.

## 2018-11-12 NOTE — Telephone Encounter (Signed)
I have sent a prescription for tramadol to her pharmacy. 

## 2018-11-13 ENCOUNTER — Ambulatory Visit: Payer: Medicaid Other | Admitting: Pulmonary Disease

## 2018-11-13 NOTE — Telephone Encounter (Signed)
Patient was called and informed of medication being sent to the pharmacy.

## 2018-11-14 ENCOUNTER — Telehealth: Payer: Self-pay

## 2018-11-14 ENCOUNTER — Ambulatory Visit: Payer: Medicaid Other | Admitting: Pulmonary Disease

## 2018-11-14 NOTE — Telephone Encounter (Signed)
Patient was called and informed of lab results and pap smear results.

## 2018-11-14 NOTE — Telephone Encounter (Signed)
-----   Message from [name] Newlin, MD sent at 11/12/2018  5:54 PM EST ----- Pap smear is negative for malignancy 

## 2018-11-15 DIAGNOSIS — L858 Other specified epidermal thickening: Secondary | ICD-10-CM | POA: Diagnosis not present

## 2018-11-23 ENCOUNTER — Telehealth: Payer: Self-pay | Admitting: Family Medicine

## 2018-11-23 DIAGNOSIS — L98491 Non-pressure chronic ulcer of skin of other sites limited to breakdown of skin: Secondary | ICD-10-CM

## 2018-11-23 NOTE — Telephone Encounter (Signed)
If she has a vaginal reaction, she will need to go to the ED or UC

## 2018-11-23 NOTE — Telephone Encounter (Signed)
Pt name and DOB verified . Pt aware of message per Dr. Alvis Lemmings.

## 2018-11-23 NOTE — Telephone Encounter (Signed)
Advised patient to go to the ED or urgent care if symptoms worsen

## 2018-11-23 NOTE — Telephone Encounter (Signed)
Pt called states she is having a vaginal reaction to some anti biotic medication and would like another medication and advice on what to do. Pt is complaining of a strong smell and has not been able to get rid of it

## 2018-11-23 NOTE — Telephone Encounter (Signed)
Patient called stating she went to her dermatologist and was prescribed two creams for her feet. Patient states that the creams are not helping her and requests to be referred out the the podiatrist. Patient would like to go to [facility] at  [address], [place], [place] [address] with Dr. Rogelia Rohrer. Please f/u

## 2018-11-23 NOTE — Telephone Encounter (Signed)
Will route to PCP for review. 

## 2018-11-24 DIAGNOSIS — J188 Other pneumonia, unspecified organism: Secondary | ICD-10-CM | POA: Diagnosis not present

## 2018-11-24 DIAGNOSIS — L03119 Cellulitis of unspecified part of limb: Secondary | ICD-10-CM | POA: Diagnosis not present

## 2018-11-24 DIAGNOSIS — J9690 Respiratory failure, unspecified, unspecified whether with hypoxia or hypercapnia: Secondary | ICD-10-CM | POA: Diagnosis not present

## 2018-11-24 DIAGNOSIS — J45998 Other asthma: Secondary | ICD-10-CM | POA: Diagnosis not present

## 2018-11-24 DIAGNOSIS — J45909 Unspecified asthma, uncomplicated: Secondary | ICD-10-CM | POA: Diagnosis not present

## 2018-11-24 DIAGNOSIS — J4551 Severe persistent asthma with (acute) exacerbation: Secondary | ICD-10-CM | POA: Diagnosis not present

## 2018-11-24 DIAGNOSIS — G4733 Obstructive sleep apnea (adult) (pediatric): Secondary | ICD-10-CM | POA: Diagnosis not present

## 2018-11-24 DIAGNOSIS — M1712 Unilateral primary osteoarthritis, left knee: Secondary | ICD-10-CM | POA: Diagnosis not present

## 2018-11-24 DIAGNOSIS — R05 Cough: Secondary | ICD-10-CM | POA: Diagnosis not present

## 2018-11-24 DIAGNOSIS — J45901 Unspecified asthma with (acute) exacerbation: Secondary | ICD-10-CM | POA: Diagnosis not present

## 2018-11-24 DIAGNOSIS — Z96659 Presence of unspecified artificial knee joint: Secondary | ICD-10-CM | POA: Diagnosis not present

## 2018-11-24 DIAGNOSIS — N179 Acute kidney failure, unspecified: Secondary | ICD-10-CM | POA: Diagnosis not present

## 2018-11-24 DIAGNOSIS — R269 Unspecified abnormalities of gait and mobility: Secondary | ICD-10-CM | POA: Diagnosis not present

## 2018-11-27 NOTE — Telephone Encounter (Signed)
Referral has been placed. 

## 2018-11-28 ENCOUNTER — Other Ambulatory Visit: Payer: Self-pay | Admitting: Pulmonary Disease

## 2018-11-28 NOTE — Telephone Encounter (Signed)
Patient was called and informed of referral being placed. 

## 2018-12-11 NOTE — Telephone Encounter (Signed)
Error

## 2018-12-23 DIAGNOSIS — R269 Unspecified abnormalities of gait and mobility: Secondary | ICD-10-CM | POA: Diagnosis not present

## 2018-12-23 DIAGNOSIS — L03119 Cellulitis of unspecified part of limb: Secondary | ICD-10-CM | POA: Diagnosis not present

## 2018-12-23 DIAGNOSIS — J4551 Severe persistent asthma with (acute) exacerbation: Secondary | ICD-10-CM | POA: Diagnosis not present

## 2018-12-23 DIAGNOSIS — J9690 Respiratory failure, unspecified, unspecified whether with hypoxia or hypercapnia: Secondary | ICD-10-CM | POA: Diagnosis not present

## 2018-12-24 ENCOUNTER — Other Ambulatory Visit: Payer: Self-pay

## 2018-12-24 ENCOUNTER — Encounter (HOSPITAL_COMMUNITY): Payer: Self-pay | Admitting: Emergency Medicine

## 2018-12-24 ENCOUNTER — Inpatient Hospital Stay (HOSPITAL_COMMUNITY)
Admission: EM | Admit: 2018-12-24 | Discharge: 2018-12-27 | DRG: 202 | Disposition: A | Discharge status: Home / Self Care | Payer: Medicaid Other | Attending: Internal Medicine | Admitting: Internal Medicine

## 2018-12-24 ENCOUNTER — Emergency Department (HOSPITAL_COMMUNITY): Payer: Medicaid Other

## 2018-12-24 DIAGNOSIS — Z8249 Family history of ischemic heart disease and other diseases of the circulatory system: Secondary | ICD-10-CM

## 2018-12-24 DIAGNOSIS — Z87891 Personal history of nicotine dependence: Secondary | ICD-10-CM

## 2018-12-24 DIAGNOSIS — M940 Chondrocostal junction syndrome [Tietze]: Secondary | ICD-10-CM | POA: Diagnosis present

## 2018-12-24 DIAGNOSIS — J9620 Acute and chronic respiratory failure, unspecified whether with hypoxia or hypercapnia: Secondary | ICD-10-CM | POA: Diagnosis present

## 2018-12-24 DIAGNOSIS — K0889 Other specified disorders of teeth and supporting structures: Secondary | ICD-10-CM | POA: Diagnosis present

## 2018-12-24 DIAGNOSIS — Z830 Family history of human immunodeficiency virus [HIV] disease: Secondary | ICD-10-CM

## 2018-12-24 DIAGNOSIS — R0602 Shortness of breath: Secondary | ICD-10-CM | POA: Diagnosis not present

## 2018-12-24 DIAGNOSIS — R000 Tachycardia, unspecified: Secondary | ICD-10-CM | POA: Diagnosis not present

## 2018-12-24 DIAGNOSIS — J45901 Unspecified asthma with (acute) exacerbation: Secondary | ICD-10-CM | POA: Diagnosis present

## 2018-12-24 DIAGNOSIS — I10 Essential (primary) hypertension: Secondary | ICD-10-CM | POA: Diagnosis present

## 2018-12-24 DIAGNOSIS — Z9119 Patient's noncompliance with other medical treatment and regimen: Secondary | ICD-10-CM | POA: Diagnosis not present

## 2018-12-24 DIAGNOSIS — K219 Gastro-esophageal reflux disease without esophagitis: Secondary | ICD-10-CM | POA: Diagnosis present

## 2018-12-24 DIAGNOSIS — Z91040 Latex allergy status: Secondary | ICD-10-CM

## 2018-12-24 DIAGNOSIS — Z79899 Other long term (current) drug therapy: Secondary | ICD-10-CM | POA: Diagnosis not present

## 2018-12-24 DIAGNOSIS — G4733 Obstructive sleep apnea (adult) (pediatric): Secondary | ICD-10-CM | POA: Diagnosis not present

## 2018-12-24 DIAGNOSIS — J9621 Acute and chronic respiratory failure with hypoxia: Secondary | ICD-10-CM

## 2018-12-24 DIAGNOSIS — Z6841 Body Mass Index (BMI) 40.0 and over, adult: Secondary | ICD-10-CM | POA: Diagnosis not present

## 2018-12-24 DIAGNOSIS — J4541 Moderate persistent asthma with (acute) exacerbation: Principal | ICD-10-CM

## 2018-12-24 DIAGNOSIS — R079 Chest pain, unspecified: Secondary | ICD-10-CM | POA: Diagnosis present

## 2018-12-24 DIAGNOSIS — D509 Iron deficiency anemia, unspecified: Secondary | ICD-10-CM | POA: Diagnosis not present

## 2018-12-24 DIAGNOSIS — J441 Chronic obstructive pulmonary disease with (acute) exacerbation: Secondary | ICD-10-CM | POA: Diagnosis present

## 2018-12-24 DIAGNOSIS — Z7951 Long term (current) use of inhaled steroids: Secondary | ICD-10-CM | POA: Diagnosis not present

## 2018-12-24 DIAGNOSIS — G5790 Unspecified mononeuropathy of unspecified lower limb: Secondary | ICD-10-CM | POA: Diagnosis present

## 2018-12-24 DIAGNOSIS — J4551 Severe persistent asthma with (acute) exacerbation: Secondary | ICD-10-CM

## 2018-12-24 LAB — INFLUENZA PANEL BY PCR (TYPE A & B)
Influenza A By PCR: NEGATIVE
Influenza B By PCR: NEGATIVE

## 2018-12-24 LAB — BASIC METABOLIC PANEL
Anion gap: 7 (ref 5–15)
BUN: 13 mg/dL (ref 6–20)
CALCIUM: 8.1 mg/dL — AB (ref 8.9–10.3)
CO2: 25 mmol/L (ref 22–32)
CREATININE: 0.95 mg/dL (ref 0.44–1.00)
Chloride: 107 mmol/L (ref 98–111)
Glucose, Bld: 120 mg/dL — ABNORMAL HIGH (ref 70–99)
Potassium: 4.2 mmol/L (ref 3.5–5.1)
SODIUM: 139 mmol/L (ref 135–145)

## 2018-12-24 LAB — CBC WITH DIFFERENTIAL/PLATELET
Abs Immature Granulocytes: 0.02 10*3/uL (ref 0.00–0.07)
BASOS ABS: 0.1 10*3/uL (ref 0.0–0.1)
BASOS PCT: 1 %
EOS ABS: 0 10*3/uL (ref 0.0–0.5)
EOS PCT: 0 %
HEMATOCRIT: 40.1 % (ref 36.0–46.0)
Hemoglobin: 12.1 g/dL (ref 12.0–15.0)
IMMATURE GRANULOCYTES: 0 %
Lymphocytes Relative: 19 %
Lymphs Abs: 1.3 10*3/uL (ref 0.7–4.0)
MCH: 27.2 pg (ref 26.0–34.0)
MCHC: 30.2 g/dL (ref 30.0–36.0)
MCV: 90.1 fL (ref 80.0–100.0)
Monocytes Absolute: 0.9 10*3/uL (ref 0.1–1.0)
Monocytes Relative: 13 %
NEUTROS PCT: 67 %
NRBC: 0 % (ref 0.0–0.2)
Neutro Abs: 4.6 10*3/uL (ref 1.7–7.7)
Platelets: 299 10*3/uL (ref 150–400)
RBC: 4.45 MIL/uL (ref 3.87–5.11)
RDW: 19.4 % — ABNORMAL HIGH (ref 11.5–15.5)
WBC: 6.9 10*3/uL (ref 4.0–10.5)

## 2018-12-24 LAB — BLOOD GAS, VENOUS
Acid-Base Excess: 0.5 mmol/L (ref 0.0–2.0)
Bicarbonate: 26.9 mmol/L (ref 20.0–28.0)
FIO2: 21
O2 Saturation: 71.9 %
PO2 VEN: 43.7 mmHg (ref 32.0–45.0)
Patient temperature: 98.6
pCO2, Ven: 53.9 mmHg (ref 44.0–60.0)
pH, Ven: 7.319 (ref 7.250–7.430)

## 2018-12-24 LAB — I-STAT TROPONIN, ED: Troponin i, poc: 0 ng/mL (ref 0.00–0.08)

## 2018-12-24 LAB — BRAIN NATRIURETIC PEPTIDE: B Natriuretic Peptide: 46.9 pg/mL (ref 0.0–100.0)

## 2018-12-24 MED ORDER — CLONIDINE HCL 0.1 MG PO TABS
0.1000 mg | ORAL_TABLET | Freq: Every day | ORAL | Status: DC
  Administered 2018-12-25 (×2): 0.1 mg via ORAL
  Filled 2018-12-24 (×2): qty 1

## 2018-12-24 MED ORDER — ONDANSETRON HCL 4 MG/2ML IJ SOLN: 4.0000 mg | Freq: Four times a day (QID) | INTRAMUSCULAR | Status: DC | PRN

## 2018-12-24 MED ORDER — FLUTICASONE PROPIONATE 50 MCG/ACT NA SUSP
1.0000 | Freq: Every day | NASAL | Status: DC | PRN
  Administered 2018-12-27: 1 via NASAL
  Filled 2018-12-24: qty 16

## 2018-12-24 MED ORDER — IPRATROPIUM BROMIDE 0.02 % IN SOLN
0.5000 mg | RESPIRATORY_TRACT | Status: DC
  Administered 2018-12-25: 0.5 mg via RESPIRATORY_TRACT

## 2018-12-24 MED ORDER — METHOCARBAMOL 500 MG PO TABS
500.0000 mg | ORAL_TABLET | Freq: Three times a day (TID) | ORAL | Status: DC | PRN
  Administered 2018-12-25 – 2018-12-26 (×3): 500 mg via ORAL
  Filled 2018-12-24 (×3): qty 1

## 2018-12-24 MED ORDER — LEVALBUTEROL HCL 1.25 MG/0.5ML IN NEBU: 1.2500 mg | INHALATION_SOLUTION | Freq: Four times a day (QID) | RESPIRATORY_TRACT | Status: DC

## 2018-12-24 MED ORDER — MENTHOL 3 MG MT LOZG
1.0000 | LOZENGE | Freq: Two times a day (BID) | OROMUCOSAL | Status: DC | PRN
  Filled 2018-12-24: qty 9

## 2018-12-24 MED ORDER — ALBUTEROL (5 MG/ML) CONTINUOUS INHALATION SOLN
15.0000 mg/h | INHALATION_SOLUTION | Freq: Once | RESPIRATORY_TRACT | Status: AC
  Administered 2018-12-24: 15 mg/h via RESPIRATORY_TRACT
  Filled 2018-12-24: qty 20

## 2018-12-24 MED ORDER — HYDRALAZINE HCL 20 MG/ML IJ SOLN
5.0000 mg | INTRAMUSCULAR | Status: DC | PRN
  Administered 2018-12-25: 5 mg via INTRAVENOUS
  Filled 2018-12-24 (×3): qty 1

## 2018-12-24 MED ORDER — ACETAMINOPHEN 325 MG PO TABS
650.0000 mg | ORAL_TABLET | Freq: Four times a day (QID) | ORAL | Status: DC | PRN
  Administered 2018-12-25: 650 mg via ORAL
  Filled 2018-12-24: qty 2

## 2018-12-24 MED ORDER — DM-GUAIFENESIN ER 30-600 MG PO TB12
1.0000 | ORAL_TABLET | Freq: Two times a day (BID) | ORAL | Status: DC | PRN
  Administered 2018-12-25: 1 via ORAL
  Filled 2018-12-24: qty 1

## 2018-12-24 MED ORDER — ACETAMINOPHEN 650 MG RE SUPP: 650.0000 mg | Freq: Four times a day (QID) | RECTAL | Status: DC | PRN

## 2018-12-24 MED ORDER — ONDANSETRON HCL 4 MG PO TABS: 4.0000 mg | ORAL_TABLET | Freq: Four times a day (QID) | ORAL | Status: DC | PRN

## 2018-12-24 MED ORDER — IPRATROPIUM-ALBUTEROL 0.5-2.5 (3) MG/3ML IN SOLN: 3.0000 mL | Freq: Once | RESPIRATORY_TRACT | Status: DC

## 2018-12-24 MED ORDER — ALBUTEROL SULFATE (2.5 MG/3ML) 0.083% IN NEBU
5.0000 mg | INHALATION_SOLUTION | Freq: Once | RESPIRATORY_TRACT | Status: AC
  Administered 2018-12-24: 5 mg via RESPIRATORY_TRACT
  Filled 2018-12-24: qty 6

## 2018-12-24 MED ORDER — NITROGLYCERIN 0.4 MG SL SUBL: 0.4000 mg | SUBLINGUAL_TABLET | SUBLINGUAL | Status: DC | PRN

## 2018-12-24 MED ORDER — MONTELUKAST SODIUM 10 MG PO TABS
10.0000 mg | ORAL_TABLET | Freq: Every day | ORAL | Status: DC
  Administered 2018-12-25 – 2018-12-26 (×3): 10 mg via ORAL
  Filled 2018-12-24 (×3): qty 1

## 2018-12-24 MED ORDER — GABAPENTIN 300 MG PO CAPS
300.0000 mg | ORAL_CAPSULE | Freq: Two times a day (BID) | ORAL | Status: DC
  Administered 2018-12-25: 300 mg via ORAL
  Filled 2018-12-24: qty 1

## 2018-12-24 MED ORDER — METHYLPREDNISOLONE SODIUM SUCC 125 MG IJ SOLR
60.0000 mg | Freq: Three times a day (TID) | INTRAMUSCULAR | Status: DC
  Administered 2018-12-25 – 2018-12-26 (×7): 60 mg via INTRAVENOUS
  Filled 2018-12-24 (×7): qty 2

## 2018-12-24 MED ORDER — IPRATROPIUM BROMIDE 0.02 % IN SOLN
1.0000 mg | Freq: Once | RESPIRATORY_TRACT | Status: AC
  Administered 2018-12-24: 1 mg via RESPIRATORY_TRACT
  Filled 2018-12-24: qty 5

## 2018-12-24 MED ORDER — ASPIRIN EC 81 MG PO TBEC
81.0000 mg | DELAYED_RELEASE_TABLET | Freq: Every day | ORAL | Status: DC
  Administered 2018-12-25 – 2018-12-27 (×3): 81 mg via ORAL
  Filled 2018-12-24 (×3): qty 1

## 2018-12-24 MED ORDER — ENOXAPARIN SODIUM 40 MG/0.4ML ~~LOC~~ SOLN
40.0000 mg | Freq: Every day | SUBCUTANEOUS | Status: DC
  Administered 2018-12-25: 40 mg via SUBCUTANEOUS
  Filled 2018-12-24 (×3): qty 0.4

## 2018-12-24 NOTE — ED Triage Notes (Signed)
Patient is from home and transported via [facility]. Patient is experiencing shortness of breath x 2 days. Patient has been using nebulizer treatments with no relief. EMS obtained an IV, administered ALBUTEROL 10mg , ATROVENT 0.5mg , SOLU-MEDROL 125mg , and MAGNESIUM 2g. According to EMS, patient has wheezing and rhonchi in all lung fields.

## 2018-12-24 NOTE — ED Notes (Signed)
RT re-paged for continuous nebulizer.

## 2018-12-24 NOTE — H&P (Signed)
History and Physical    Kathleen Rodriguez JKK:[id number] DOB: 09-20-1966 DOA: 12/24/2018  Referring MD/NP/PA:   PCP: Hoy Register, MD   Patient coming from:  The patient is coming from home.  At baseline, pt is independent for most of ADL.        Chief Complaint: Cough, shortness breath, chest pain  HPI: Kathleen Rodriguez is a 53 y.o. female with medical history significant of hypertension, former smoker, asthma/COPD, GERD, cocaine abuse, tobacco abuse, OSA on CPAP, who presents with cough, shortness breath and chest pain.  Patient states that she has been having shortness breath in the past 3 days, which has been progressively worsening.  She coughs up brownish colored sputum, denies fever or chills.  She also reports chest pain, which is located in front lower chest, constant, 9 out of 10 severity, pressure-like, nonradiating.  She also has lower chest wall tenderness on palpation.  Denies recent long distant traveling, no tenderness in the calvaria.  Patient denies nausea vomiting, diarrhea, abdominal pain, symptoms of UTI or unilateral weakness.  ED Course: pt was found to have negative troponin, BNP 46.9, WBC 6.9, flu PCR negative, electrolytes renal function okay, temperature normal, tachycardia, tachypnea, oxygen saturation 92% on room air, chest x-ray no infiltration or pulmonary edema.  VBG with pH 7.319, CO2 53, O2 43.7. Patient is admitted to stepdown as inpatient.   Review of Systems:   General: no fevers, chills, no body weight gain, has fatigue HEENT: no blurry vision, hearing changes or sore throat Respiratory: has dyspnea, coughing, wheezing CV: has chest pain, no palpitations GI: no nausea, vomiting, abdominal pain, diarrhea, constipation GU: no dysuria, burning on urination, increased urinary frequency, hematuria  Ext: no leg edema Neuro: no unilateral weakness, numbness, or tingling, no vision change or hearing loss Skin: no rash, no skin tear. MSK: No muscle spasm, no  deformity, no limitation of range of movement in spin Heme: No easy bruising.  Travel history: No recent long distant travel.  Allergy:  Allergies  Allergen Reactions  . Tomato Hives, Itching and Other (See Comments)    ALSO REACTS TO KETCHUP  . Latex Itching and Rash  . Wool Alcohol [Lanolin] Itching    Past Medical History:  Diagnosis Date  . Arthritis    "knees, lower back; legs, ankles" (01/27/2016)  . Asthma    followed by Dr. Craige Cotta  . CHF (congestive heart failure) (HCC) 2016   "when I went into a coma"  . Cocaine abuse (HCC)   . 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 Dyspnea   . GERD (gastroesophageal reflux disease)   . Hypertension    "doctor took me off RX in 2016" (01/27/2016)  . Influenza B April 2014   Complicated by multi-organ failure  . OSA on CPAP "since " 03/20/2013  . Pneumonia 2016  . Required emergent intubation    asthma exacerbation in 2016  . Tobacco abuse   . Upper airway cough syndrome     Past Surgical History:  Procedure Laterality Date  . BREAST SURGERY Right 1980   as a teenager , cyst was benign  . CESAREAN SECTION  2006  . LACERATION REPAIR Right ~ 1997   "tried to cut myself"  . 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 TUBAL LIGATION  2006    Social History:  reports that she quit  smoking about 5 years ago. Her smoking use included cigarettes. She has a 5.00 pack-year smoking history. She has never used smokeless tobacco. She reports that she does not drink alcohol or use drugs.  Family History:  Family History  Problem Relation Age of Onset  . Hypertension Mother   . HIV/AIDS Father      Prior to Admission medications   Medication Sig Start Date End Date Taking? Authorizing Provider  acetaminophen (TYLENOL) 500 MG tablet Take 500 mg by mouth every 6 (six) hours as needed for mild pain.   Yes  [provider]  albuterol (PROVENTIL HFA;VENTOLIN HFA) 108 (90 Base) MCG/ACT inhaler Inhale 2 puffs into the lungs every 6 (six) hours as needed for wheezing or shortness of breath. 04/13/18  Yes [name], MD  albuterol (PROVENTIL) (2.5 MG/3ML) 0.083% nebulizer solution Take 3 mLs (2.5 mg total) by nebulization every 6 (six) hours as needed for wheezing or shortness of breath. 02/19/18  Yes Sood, Laurier Nancy, MD  budesonide (PULMICORT) 0.25 MG/2ML nebulizer solution Take 2 mLs (0.25 mg total) by nebulization 2 (two) times daily. Patient taking differently: Take 0.25 mg by nebulization 3 (three) times daily as needed (wheezing and sob).  02/19/18 02/19/19 Yes Sood, Laurier Nancy, MD  Camphor-Eucalyptus-Menthol (VICKS VAPORUB EX) Apply 1 application topically daily as needed (congestion).   Yes [provider]  cloNIDine (CATAPRES) 0.1 MG tablet Take 1 tablet (0.1 mg total) by mouth at bedtime. For hot flashes 11/07/18  Yes Newlin, Odette Horns, MD  fluticasone (FLONASE) 50 MCG/ACT nasal spray Place 1 spray into both nostrils daily. Patient taking differently: Place 1 spray into both nostrils daily as needed for allergies.  02/19/18  Yes [name], MD  gabapentin (NEURONTIN) 300 MG capsule Take 1 capsule (300 mg total) by mouth 2 (two) times daily. 09/05/18  Yes Hoy Register, MD  Menthol (HALLS COUGH DROPS MT) Use as directed 1 drop in the mouth or throat 2 (two) times daily as needed (sore throat and cough).   Yes [provider]  methocarbamol (ROBAXIN) 500 MG tablet Take 1 tablet (500 mg total) by mouth every 8 (eight) hours as needed for muscle spasms. 09/05/18  Yes Hoy Register, MD  cephALEXin (KEFLEX) 500 MG capsule Take 1 capsule (500 mg total) by mouth 2 (two) times daily. 11/07/18   Hoy Register, MD  hydrOXYzine (ATARAX/VISTARIL) 25 MG tablet Take 1 tablet (25 mg total) by mouth 3 (three) times daily as needed for itching. Patient not taking: Reported on 12/24/2018 08/29/18    Storm Frisk, MD  montelukast (SINGULAIR) 10 MG tablet Take 1 tablet (10 mg total) by mouth at bedtime. Patient not taking: Reported on 11/07/2018 06/06/18   [name], MD  Spacer/Aero-Holding Chambers (AEROCHAMBER MV) inhaler Use as instructed 07/11/18   [name], MD  terbinafine (LAMISIL AT) 1 % cream Apply 1 application topically 2 (two) times daily. Patient not taking: Reported on 11/07/2018 09/05/18   Hoy Register, MD  traMADol (ULTRAM) 50 MG tablet Take 1 tablet (50 mg total) by mouth every 12 (twelve) hours as needed. Patient not taking: Reported on 12/24/2018 11/12/18   Hoy Register, MD    Physical Exam: Vitals:   12/25/18 0200 12/25/18 0348 12/25/18 0400 12/25/18 0500  BP: (!) 196/81 (!) 169/73 (!) 162/71 (!) 166/71  Pulse:  (!) 102    Resp: 19 (!) 21 (!) 21 (!) 21  Temp:      TempSrc:      SpO2: 98% 96% 96%  Weight:      Height:       General: Not in acute distress HEENT:       Eyes: PERRL, EOMI, no scleral icterus.       ENT: No discharge from the ears and nose, no pharynx injection, no tonsillar enlargement.        Neck: No JVD, no bruit, no mass felt. Heme: No neck lymph node enlargement. Cardiac: S1/S2, RRR, No murmurs, No gallops or rubs. Respiratory: Has diffuse rhonchi and no wheezing bilaterally Chest wall: Patient has front chest wall tenderness on palpation GI: Soft, nondistended, nontender, no rebound pain, no organomegaly, BS present. GU: No hematuria Ext: No pitting leg edema bilaterally. 2+DP/PT pulse bilaterally. Musculoskeletal: No joint deformities, No joint redness or warmth, no limitation of ROM in spin. Skin: No rashes.  Neuro: Alert, oriented X3, cranial nerves II-XII grossly intact, moves all extremities normally.  Psych: Patient is not psychotic, no suicidal or hemocidal ideation.  Labs on Admission: I have personally reviewed following labs and imaging studies  CBC: Recent Labs  Lab 12/24/18 1915  WBC 6.9  NEUTROABS 4.6  HGB  12.1  HCT 40.1  MCV 90.1  PLT 299   Basic Metabolic Panel: Recent Labs  Lab 12/24/18 1915  NA 139  K 4.2  CL 107  CO2 25  GLUCOSE 120*  BUN 13  CREATININE 0.95  CALCIUM 8.1*   GFR: Estimated Creatinine Clearance: 86.8 mL/min (by C-G formula based on SCr of 0.95 mg/dL). Liver Function Tests: No results for input(s): AST, ALT, ALKPHOS, BILITOT, PROT, ALBUMIN in the last 168 hours. No results for input(s): LIPASE, AMYLASE in the last 168 hours. No results for input(s): AMMONIA in the last 168 hours. Coagulation Profile: No results for input(s): INR, PROTIME in the last 168 hours. Cardiac Enzymes: Recent Labs  Lab 12/25/18 0154  TROPONINI <0.03   BNP (last 3 results) No results for input(s): PROBNP in the last 8760 hours. HbA1C: No results for input(s): HGBA1C in the last 72 hours. CBG: No results for input(s): GLUCAP in the last 168 hours. Lipid Profile: No results for input(s): CHOL, HDL, LDLCALC, TRIG, CHOLHDL, LDLDIRECT in the last 72 hours. Thyroid Function Tests: No results for input(s): TSH, T4TOTAL, FREET4, T3FREE, THYROIDAB in the last 72 hours. Anemia Panel: No results for input(s): VITAMINB12, FOLATE, FERRITIN, TIBC, IRON, RETICCTPCT in the last 72 hours. Urine analysis:    Component Value Date/Time   COLORURINE YELLOW 08/07/2018 1752   APPEARANCEUR CLEAR 08/07/2018 1752   LABSPEC 1.033 (H) 08/07/2018 1752   PHURINE 5.0 08/07/2018 1752   GLUCOSEU NEGATIVE 08/07/2018 1752   HGBUR NEGATIVE 08/07/2018 1752   BILIRUBINUR NEGATIVE 08/07/2018 1752   BILIRUBINUR small 05/26/2017 1539   KETONESUR NEGATIVE 08/07/2018 1752   PROTEINUR NEGATIVE 08/07/2018 1752   UROBILINOGEN 1.0 05/26/2017 1539   UROBILINOGEN 1.0 03/22/2014 0805   NITRITE NEGATIVE 08/07/2018 1752   LEUKOCYTESUR NEGATIVE 08/07/2018 1752   Sepsis Labs: @LABRCNTIP (procalcitonin:4,lacticidven:4) )No results found for this or any previous visit (from the past 240 hour(s)).   Radiological Exams  on Admission: Dg Chest Port 1 View  Result Date: 12/24/2018 CLINICAL DATA:  Shortness of breath for 2 days. No relief with nebulizer treatments. EXAM: PORTABLE CHEST 1 VIEW COMPARISON:  Two-view chest x-ray 07/30/2018 FINDINGS: The cardiac silhouette has increased in size since the prior study. There is some straightening of the left border. No edema or effusion is present. No focal airspace disease is present the visualized soft tissues and bony  thorax are unremarkable. IMPRESSION: 1. Slight increase in size and fullness of the cardiac silhouette. Pericardial effusion is not excluded. Consider echocardiogram for further evaluation. 2. No edema or effusion to suggest failure. No focal airspace disease. Electronically Signed   By: Marin Roberts M.D.   On: 12/24/2018 19:25     EKG: Independently reviewed.  Sinus rhythm, QTC 466, LAD, nonspecific T wave change.  Assessment/Plan Principal Problem:   Asthma exacerbation Active Problems:   OSA (obstructive sleep apnea)   GERD (gastroesophageal reflux disease)   COPD exacerbation (HCC)   Essential hypertension   Anemia, iron deficiency   Acute on chronic respiratory failure with hypoxia (HCC)   Chest pain   Acute on chronic respiratory failure with hypoxia due to asthma/COPD exacerbation: Patient has diffuse wheezing and rhonchi, shortness breath, productive cough, consistent with exacerbation of COPD/asthma.  Chest x-ray did not show pulmonary edema or infiltration. Flu PCR negative.  -will admit patient to SDU as inpt -BiPAP -Nebulizers: scheduled Atrovent and prn Xopenex Nebs -singulair -Solu-Medrol 60 mg IV tid -Z pak -Mucinex for cough  -Incentive spirometry -Follow up sputum culture, respiratory virus panel -Nasal cannula oxygen as needed to maintain O2 saturation 93% or greater  OSA: was on CPAP at home -now on BIPAP -CPAP  GERD (gastroesophageal reflux disease): -protonix  HTN:  -Continue home medications:  Clonidine, -IV hydralazine prn  Anemia, iron deficiency: hgb stable, hgb12.1 -f/u By CBC  Chest pain: Patient has atypical chest pain.  Patient has chest wall tenderness on palpation, indicating possible musculoskeletal or costochondritis.  Since patient has typical wheezing and rhonchi on auscultation, clinically most consistent with COPD/asthma exacerbation, low suspicions for PE. -Follow-up A1c, FLP, UDS -Troponin x3 -Aspirin -PRN nitroglycerin -prn tylenol for pain    Inpatient status:  # Patient requires inpatient status due to high intensity of service, high risk for further deterioration and high frequency of surveillance required.  I certify that at the point of admission it is my clinical judgment that the patient will require inpatient hospital care spanning beyond 2 midnights from the point of admission.  . This patient has multiple chronic comorbidities including hypertension, former smoker, asthma/COPD, GERD, cocaine abuse, tobacco abuse, OSA on CPAP . Now patient has presenting with acute respiratory distress with hypoxia, secondary to asthma/COPD exacerbation. . The worrisome physical exam findings include acute respiratory distress, rhonchi and and wheezing on auscultation . Current medical needs: please see my assessment and plan . Predictability of an adverse outcome (risk): Patient has multiple comorbidities, now presents with acute respiratory distress with hypoxia due to asthma/COPD exacerbation, requiring BiPAP, at high risk for deteriorating.  Will need to be treated in hospital for at least 2 days.     DVT ppx: SQ Lovenox Code Status: Full code Family Communication: None at bed side Disposition Plan:  Anticipate discharge back to previous home environment Consults called:  none Admission status:   SDU/inpation       Date of Service 12/25/2018    Lorretta Harp [facility]   If 7PM-7AM, please contact night-coverage [email or web address] Password  [email or web address] 12/25/2018, 6:00 AM

## 2018-12-24 NOTE — ED Notes (Signed)
RT paged for continuous neb.  

## 2018-12-24 NOTE — ED Provider Notes (Signed)
[facility]-EMERGENCY DEPT Provider Note   CSN: [id number] Arrival date & time: 12/24/18  1836    History   Chief Complaint Chief Complaint  Patient presents with  . Shortness of Breath    HPI Kathleen Rodriguez is a 53 y.o. female with a past medical history of asthma, CHF, history of intubation in the past, presents to ED with a chief complaint of shortness of breath, wheezing and asthma exacerbation.  States that she began having the symptoms 3 days ago.  She tried to self medicate with a leftover prednisone from prior asthma exacerbation.  She has had no relief with this or her home nebulizer inhalers.  She has been around sick contacts with URI symptoms.  She notes chest tightness and pain as well as a cough.  She was given 125 mg of Solu-Medrol, 2 g of magnesium, 10 mg of albuterol and 0.5 mg of Atrovent with mild improvement but still continues to have symptoms.     HPI  Past Medical History:  Diagnosis Date  . Arthritis    "knees, lower back; legs, ankles" (01/27/2016)  . Asthma    followed by Dr. Craige Cotta  . CHF (congestive heart failure) (HCC) 2016   "when I went into a coma"  . Cocaine abuse (HCC)   . 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 Dyspnea   . GERD (gastroesophageal reflux disease)   . Hypertension    "doctor took me off RX in 2016" (01/27/2016)  . Influenza B April 2014   Complicated by multi-organ failure  . OSA on CPAP "since " 03/20/2013  . Pneumonia 2016  . Required emergent intubation    asthma exacerbation in 2016  . Tobacco abuse   . Upper airway cough syndrome     Patient Active Problem List   Diagnosis Date Noted  . Skin ulcer (HCC) secondary to bullous impetigo [name] 08/29/2018  . Asthma 10/22/2017  . Osteoarthritis of left knee 09/01/2016  . 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 Tobacco abuse   . Primary osteoarthritis of left knee 03/09/2016  . Non compliance with  medical treatment 08/19/2015  . Anemia, iron deficiency 08/12/2015  . Essential hypertension 07/25/2015  . COPD (chronic obstructive pulmonary disease) (HCC) 12/28/2014  . Dysfunctional uterine bleeding 12/28/2014  . Anemia 12/28/2014  . Morbid obesity (HCC) 05/16/2014  . Chronic cough 05/01/2014  . Upper airway cough syndrome 05/01/2014  . GERD (gastroesophageal reflux disease) 04/08/2014  . OSA (obstructive sleep apnea) 03/20/2013  . Uncontrolled persistent asthma 02/05/2013    Past Surgical History:  Procedure Laterality Date  . BREAST SURGERY Right 1980   as a teenager , cyst was benign  . CESAREAN SECTION  2006  . LACERATION REPAIR Right ~ 1997   "tried to cut myself"  . 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 TUBAL LIGATION  2006     OB History   No obstetric history on file.      Home Medications    Prior to Admission medications   Medication Sig Start Date End Date Taking? Authorizing Provider  acetaminophen (TYLENOL) 500 MG tablet Take 500 mg by mouth every 6 (six) hours as needed for mild pain.   Yes [provider]  albuterol (PROVENTIL HFA;VENTOLIN HFA) 108 (90 Base) MCG/ACT inhaler Inhale 2 puffs into  the lungs every 6 (six) hours as needed for wheezing or shortness of breath. 04/13/18  Yes [name], MD  albuterol (PROVENTIL) (2.5 MG/3ML) 0.083% nebulizer solution Take 3 mLs (2.5 mg total) by nebulization every 6 (six) hours as needed for wheezing or shortness of breath. 02/19/18  Yes Sood, Laurier Nancy, MD  budesonide (PULMICORT) 0.25 MG/2ML nebulizer solution Take 2 mLs (0.25 mg total) by nebulization 2 (two) times daily. Patient taking differently: Take 0.25 mg by nebulization 3 (three) times daily as needed (wheezing and sob).  02/19/18 02/19/19 Yes Sood, Laurier Nancy, MD  Camphor-Eucalyptus-Menthol (VICKS VAPORUB EX) Apply 1  application topically daily as needed (congestion).   Yes [provider]  cloNIDine (CATAPRES) 0.1 MG tablet Take 1 tablet (0.1 mg total) by mouth at bedtime. For hot flashes 11/07/18  Yes Newlin, Odette Horns, MD  fluticasone (FLONASE) 50 MCG/ACT nasal spray Place 1 spray into both nostrils daily. Patient taking differently: Place 1 spray into both nostrils daily as needed for allergies.  02/19/18  Yes [name], MD  gabapentin (NEURONTIN) 300 MG capsule Take 1 capsule (300 mg total) by mouth 2 (two) times daily. 09/05/18  Yes Hoy Register, MD  Menthol (HALLS COUGH DROPS MT) Use as directed 1 drop in the mouth or throat 2 (two) times daily as needed (sore throat and cough).   Yes [provider]  methocarbamol (ROBAXIN) 500 MG tablet Take 1 tablet (500 mg total) by mouth every 8 (eight) hours as needed for muscle spasms. 09/05/18  Yes Hoy Register, MD  cephALEXin (KEFLEX) 500 MG capsule Take 1 capsule (500 mg total) by mouth 2 (two) times daily. 11/07/18   Hoy Register, MD  hydrOXYzine (ATARAX/VISTARIL) 25 MG tablet Take 1 tablet (25 mg total) by mouth 3 (three) times daily as needed for itching. Patient not taking: Reported on 12/24/2018 08/29/18   Storm Frisk, MD  montelukast (SINGULAIR) 10 MG tablet Take 1 tablet (10 mg total) by mouth at bedtime. Patient not taking: Reported on 11/07/2018 06/06/18   [name], MD  Spacer/Aero-Holding Chambers (AEROCHAMBER MV) inhaler Use as instructed 07/11/18   [name], MD  terbinafine (LAMISIL AT) 1 % cream Apply 1 application topically 2 (two) times daily. Patient not taking: Reported on 11/07/2018 09/05/18   Hoy Register, MD  traMADol (ULTRAM) 50 MG tablet Take 1 tablet (50 mg total) by mouth every 12 (twelve) hours as needed. Patient not taking: Reported on 12/24/2018 11/12/18   Hoy Register, MD    Family History Family History  Problem Relation Age of Onset  . Hypertension Mother   . HIV/AIDS Father     Social  History Social History   Tobacco Use  . Smoking status: Former Smoker    Packs/day: 0.25    Years: 20.00    Pack years: 5.00    Types: Cigarettes    Last attempt to quit: 01/31/2013    Years since quitting: 5.8  . Smokeless tobacco: Never Used  Substance Use Topics  . 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 Drug use: No    Types: "Crack" cocaine    Comment: 01/27/2016 "clean from smoking crack since 2007"     Allergies   Tomato; Latex; and Wool alcohol [lanolin]   Review of Systems Review of Systems  Constitutional: Negative for appetite change, chills and fever.  HENT: Negative for ear pain, rhinorrhea, sneezing and sore throat.   Eyes: Negative for photophobia and visual disturbance.  Respiratory: Positive  for cough, chest tightness, shortness of breath and wheezing.   Cardiovascular: Negative for chest pain and palpitations.  Gastrointestinal: Negative for abdominal pain, blood in stool, constipation, diarrhea, nausea and vomiting.  Genitourinary: Negative for dysuria, hematuria and urgency.  Musculoskeletal: Negative for myalgias.  Skin: Negative for rash.  Neurological: Negative for dizziness, weakness and light-headedness.     Physical Exam Updated Vital Signs BP (!) 159/86   Pulse (!) 107   Temp 98.7 F (37.1 C) (Oral)   Resp 20   Ht 5\' 5"  (1.651 m)   Wt 115.2 kg   SpO2 99%   BMI 42.27 kg/m   Physical Exam Vitals signs and nursing note reviewed.  Constitutional:      General: She is not in acute distress.    Appearance: She is well-developed.  HENT:     Head: Normocephalic and atraumatic.     Nose: Nose normal.  Eyes:     General: No scleral icterus.       Left eye: No discharge.     Conjunctiva/sclera: Conjunctivae normal.  Neck:     Musculoskeletal: Normal range of motion and neck supple.  Cardiovascular:     Rate and Rhythm: Regular rhythm. Tachycardia present.     Heart sounds: Normal heart sounds. No  murmur. No friction rub. No gallop.   Pulmonary:     Effort: Pulmonary effort is normal. Tachypnea present. No respiratory distress.     Breath sounds: Examination of the right-upper field reveals wheezing. Examination of the left-upper field reveals wheezing. Examination of the right-middle field reveals wheezing. Examination of the left-middle field reveals wheezing. Examination of the right-lower field reveals wheezing. Examination of the left-lower field reveals wheezing. Wheezing present.  Abdominal:     General: Bowel sounds are normal. There is no distension.     Palpations: Abdomen is soft.     Tenderness: There is no abdominal tenderness. There is no guarding.  Musculoskeletal: Normal range of motion.  Skin:    General: Skin is warm and dry.     Findings: No rash.  Neurological:     Mental Status: She is alert.     Motor: No abnormal muscle tone.     Coordination: Coordination normal.      ED Treatments / Results  Labs (all labs ordered are listed, but only abnormal results are displayed) Labs Reviewed  BASIC METABOLIC PANEL - Abnormal; Notable for the following components:      Result Value   Glucose, Bld 120 (*)    Calcium 8.1 (*)    All other components within normal limits  CBC WITH DIFFERENTIAL/PLATELET - Abnormal; Notable for the following components:   RDW 19.4 (*)    All other components within normal limits  INFLUENZA PANEL BY PCR (TYPE A & B)  BRAIN NATRIURETIC PEPTIDE  BLOOD GAS, VENOUS  I-STAT TROPONIN, ED    EKG EKG Interpretation  Date/Time:  Monday December 24 2018 19:32:00 EST Ventricular Rate:  107 PR Interval:    QRS Duration: 110 QT Interval:  349 QTC Calculation: 466 R Axis:   74 Text Interpretation:  Sinus tachycardia Probable inferior infarct, age indeterminate Abnormal lateral Q waves Borderline ST elevation, anterior leads No significant change since last tracing Confirmed by Richardean Canal ([id number]) on 12/24/2018 7:40:20 PM   Radiology Dg  Chest Port 1 View  Result Date: 12/24/2018 CLINICAL DATA:  Shortness of breath for 2 days. No relief with nebulizer treatments. EXAM: PORTABLE CHEST 1 VIEW COMPARISON:  Two-view  chest x-ray 07/30/2018 FINDINGS: The cardiac silhouette has increased in size since the prior study. There is some straightening of the left border. No edema or effusion is present. No focal airspace disease is present the visualized soft tissues and bony thorax are unremarkable. IMPRESSION: 1. Slight increase in size and fullness of the cardiac silhouette. Pericardial effusion is not excluded. Consider echocardiogram for further evaluation. 2. No edema or effusion to suggest failure. No focal airspace disease. Electronically Signed   By: Marin Roberts M.D.   On: 12/24/2018 19:25    Procedures Procedures (including critical care time)  CRITICAL CARE Performed by: Dietrich Pates   Total critical care time: 35 minutes  Critical care time was exclusive of separately billable procedures and treating other patients.  Critical care was necessary to treat or prevent imminent or life-threatening deterioration.  Critical care was time spent personally by me on the following activities: development of treatment plan with patient and/or surrogate as well as nursing, discussions with consultants, evaluation of patient's response to treatment, examination of patient, obtaining history from patient or surrogate, ordering and performing treatments and interventions, ordering and review of laboratory studies, ordering and review of radiographic studies, pulse oximetry and re-evaluation of patient's condition.  Medications Ordered in ED Medications  albuterol (PROVENTIL) (2.5 MG/3ML) 0.083% nebulizer solution 5 mg (5 mg Nebulization Given 12/24/18 1902)  albuterol (PROVENTIL,VENTOLIN) solution continuous neb (15 mg/hr Nebulization Given 12/24/18 2106)  ipratropium (ATROVENT) nebulizer solution 1 mg (1 mg Nebulization Given 12/24/18 2106)      Initial Impression / Assessment and Plan / ED Course  I have reviewed the triage vital signs and the nursing notes.  Pertinent labs & imaging results that were available during my care of the patient were reviewed by me and considered in my medical decision making (see chart for details).        53 year old female with a past medical history of asthma presents to ED for asthma exacerbation 3 days ago.  She reports shortness of breath, wheezing and chest tightness.  No improvement with leftover prednisone and home nebulizer and inhaler treatments.  Reports history of similar asthma exacerbations in the past.  She was given 125 of Solu-Medrol, 2 g mag, 10 of albuterol 1.5 of Atrovent with mild improvement but continues to have wheezing and shortness of breath.  Diffuse wheezing noted on my exam.  She is tachycardic, and tachypneic.  Suspect her tachycardia is secondary to her albuterol use.  VBG, BNP, CBC, BMP unremarkable.  Troponin is negative.  X-ray shows tracing similar to prior.  Flu swab is negative.  Chest x-ray shows cardiomegaly.  Bedside ultrasound done by Dr. Silverio Lay shows no evidence of pericardial effusion.  Patient was placed on a continuous albuterol treatment and Atrovent but continues to have persistent wheezing.  Admit for further management.    Portions of this note were generated with Scientist, clinical (histocompatibility and immunogenetics). Dictation errors may occur despite best attempts at proofreading.  Final Clinical Impressions(s) / ED Diagnoses   Final diagnoses:  Moderate persistent asthma with exacerbation    ED Discharge Orders    None       Dietrich Pates, PA-C 12/24/18 2242    Charlynne Pander, MD 12/25/18 1134

## 2018-12-24 NOTE — ED Notes (Signed)
Bed: EH20 Expected date:  Expected time:  Means of arrival:  Comments: Ems sob

## 2018-12-25 ENCOUNTER — Other Ambulatory Visit: Payer: Self-pay

## 2018-12-25 ENCOUNTER — Inpatient Hospital Stay (HOSPITAL_COMMUNITY): Payer: Medicaid Other

## 2018-12-25 DIAGNOSIS — R079 Chest pain, unspecified: Secondary | ICD-10-CM | POA: Diagnosis present

## 2018-12-25 DIAGNOSIS — R0602 Shortness of breath: Secondary | ICD-10-CM

## 2018-12-25 LAB — LIPID PANEL
CHOL/HDL RATIO: 2.2 ratio
Cholesterol: 132 mg/dL (ref 0–200)
HDL: 60 mg/dL (ref >40)
LDL Cholesterol: 69 mg/dL (ref 0–99)
Triglycerides: 17 mg/dL (ref <150)
VLDL: 3 mg/dL (ref 0–40)

## 2018-12-25 LAB — BASIC METABOLIC PANEL
Anion gap: 7 (ref 5–15)
BUN: 14 mg/dL (ref 6–20)
CHLORIDE: 108 mmol/L (ref 98–111)
CO2: 23 mmol/L (ref 22–32)
Calcium: 8.5 mg/dL — ABNORMAL LOW (ref 8.9–10.3)
Creatinine, Ser: 0.62 mg/dL (ref 0.44–1.00)
GFR calc Af Amer: >60 mL/min (ref >60)
GFR calc non Af Amer: >60 mL/min (ref >60)
Glucose, Bld: 126 mg/dL — ABNORMAL HIGH (ref 70–99)
POTASSIUM: 4.3 mmol/L (ref 3.5–5.1)
Sodium: 138 mmol/L (ref 135–145)

## 2018-12-25 LAB — CBC
HCT: 37.9 % (ref 36.0–46.0)
HEMOGLOBIN: 11.8 g/dL — AB (ref 12.0–15.0)
MCH: 27.2 pg (ref 26.0–34.0)
MCHC: 31.1 g/dL (ref 30.0–36.0)
MCV: 87.3 fL (ref 80.0–100.0)
Platelets: 311 10*3/uL (ref 150–400)
RBC: 4.34 MIL/uL (ref 3.87–5.11)
RDW: 19.3 % — ABNORMAL HIGH (ref 11.5–15.5)
WBC: 6.2 10*3/uL (ref 4.0–10.5)
nRBC: 0 % (ref 0.0–0.2)

## 2018-12-25 LAB — ECHOCARDIOGRAM COMPLETE
Height: 65 in
Weight: 4063.52 oz

## 2018-12-25 LAB — TROPONIN I
Troponin I: <0.03 ng/mL (ref <0.03)
Troponin I: <0.03 ng/mL (ref <0.03)
Troponin I: <0.03 ng/mL (ref <0.03)

## 2018-12-25 LAB — MRSA PCR SCREENING: MRSA by PCR: NEGATIVE

## 2018-12-25 MED ORDER — AZITHROMYCIN 250 MG PO TABS: 250.0000 mg | ORAL_TABLET | Freq: Every day | ORAL | Status: DC

## 2018-12-25 MED ORDER — OXYCODONE-ACETAMINOPHEN 5-325 MG PO TABS
1.0000 | ORAL_TABLET | ORAL | Status: DC | PRN
  Administered 2018-12-25 – 2018-12-26 (×3): 1 via ORAL
  Administered 2018-12-27: 2 via ORAL
  Filled 2018-12-25: qty 1
  Filled 2018-12-25: qty 2
  Filled 2018-12-25 (×2): qty 1

## 2018-12-25 MED ORDER — IPRATROPIUM BROMIDE 0.02 % IN SOLN
0.5000 mg | Freq: Four times a day (QID) | RESPIRATORY_TRACT | Status: DC
  Administered 2018-12-25 (×2): 0.5 mg via RESPIRATORY_TRACT
  Filled 2018-12-25 (×2): qty 2.5

## 2018-12-25 MED ORDER — LEVALBUTEROL HCL 1.25 MG/0.5ML IN NEBU
INHALATION_SOLUTION | RESPIRATORY_TRACT | Status: AC
  Administered 2018-12-25: 1.25 mg
  Filled 2018-12-25: qty 0.5

## 2018-12-25 MED ORDER — LEVALBUTEROL HCL 1.25 MG/0.5ML IN NEBU
1.2500 mg | INHALATION_SOLUTION | Freq: Four times a day (QID) | RESPIRATORY_TRACT | Status: DC
  Administered 2018-12-25 (×2): 1.25 mg via RESPIRATORY_TRACT
  Filled 2018-12-25 (×2): qty 0.5

## 2018-12-25 MED ORDER — BENZOCAINE 10 % MT GEL
Freq: Four times a day (QID) | OROMUCOSAL | Status: DC | PRN
  Filled 2018-12-25: qty 9.4

## 2018-12-25 MED ORDER — AZITHROMYCIN 250 MG PO TABS
500.0000 mg | ORAL_TABLET | Freq: Every day | ORAL | Status: AC
  Administered 2018-12-25: 500 mg via ORAL
  Filled 2018-12-25: qty 2

## 2018-12-25 MED ORDER — LEVALBUTEROL HCL 1.25 MG/0.5ML IN NEBU
1.2500 mg | INHALATION_SOLUTION | RESPIRATORY_TRACT | Status: DC
  Administered 2018-12-25 – 2018-12-27 (×11): 1.25 mg via RESPIRATORY_TRACT
  Filled 2018-12-25 (×11): qty 0.5

## 2018-12-25 MED ORDER — POLYETHYLENE GLYCOL 3350 17 G PO PACK
17.0000 g | PACK | Freq: Every day | ORAL | Status: DC
  Administered 2018-12-25: 17 g via ORAL
  Filled 2018-12-25 (×3): qty 1

## 2018-12-25 MED ORDER — LEVALBUTEROL HCL 0.63 MG/3ML IN NEBU: 0.6300 mg | INHALATION_SOLUTION | RESPIRATORY_TRACT | Status: DC | PRN

## 2018-12-25 MED ORDER — AMOXICILLIN-POT CLAVULANATE 875-125 MG PO TABS
1.0000 | ORAL_TABLET | Freq: Two times a day (BID) | ORAL | Status: DC
  Administered 2018-12-25 – 2018-12-27 (×4): 1 via ORAL
  Filled 2018-12-25 (×5): qty 1

## 2018-12-25 MED ORDER — IPRATROPIUM BROMIDE 0.02 % IN SOLN
0.5000 mg | RESPIRATORY_TRACT | Status: DC
  Administered 2018-12-25 – 2018-12-27 (×11): 0.5 mg via RESPIRATORY_TRACT
  Filled 2018-12-25 (×11): qty 2.5

## 2018-12-25 MED ORDER — GABAPENTIN 300 MG PO CAPS
300.0000 mg | ORAL_CAPSULE | Freq: Three times a day (TID) | ORAL | Status: DC
  Administered 2018-12-25 – 2018-12-27 (×6): 300 mg via ORAL
  Filled 2018-12-25 (×6): qty 1

## 2018-12-25 MED ORDER — PANTOPRAZOLE SODIUM 40 MG PO TBEC
40.0000 mg | DELAYED_RELEASE_TABLET | Freq: Every day | ORAL | Status: DC
  Administered 2018-12-25 – 2018-12-27 (×3): 40 mg via ORAL
  Filled 2018-12-25 (×3): qty 1

## 2018-12-25 MED ORDER — CHLORHEXIDINE GLUCONATE CLOTH 2 % EX PADS
6.0000 | MEDICATED_PAD | Freq: Every day | CUTANEOUS | Status: DC
  Administered 2018-12-26 – 2018-12-27 (×2): 6 via TOPICAL

## 2018-12-25 MED ORDER — SENNOSIDES-DOCUSATE SODIUM 8.6-50 MG PO TABS
2.0000 | ORAL_TABLET | Freq: Two times a day (BID) | ORAL | Status: DC
  Administered 2018-12-25 – 2018-12-27 (×4): 2 via ORAL
  Filled 2018-12-25 (×5): qty 2

## 2018-12-25 MED ORDER — LORAZEPAM 1 MG PO TABS
1.0000 mg | ORAL_TABLET | Freq: Three times a day (TID) | ORAL | Status: DC | PRN
  Administered 2018-12-25: 1 mg via ORAL
  Filled 2018-12-25: qty 1

## 2018-12-25 MED ORDER — CHLORHEXIDINE GLUCONATE CLOTH 2 % EX PADS: 6.0000 | MEDICATED_PAD | Freq: Every day | CUTANEOUS | Status: DC

## 2018-12-25 MED ORDER — IPRATROPIUM BROMIDE 0.02 % IN SOLN
RESPIRATORY_TRACT | Status: AC
  Filled 2018-12-25: qty 2.5

## 2018-12-25 NOTE — Progress Notes (Signed)
Pt transported from ED to ICU 1230 on bipap.  Pt tolerated transport well without incident.

## 2018-12-25 NOTE — Progress Notes (Signed)
  Echocardiogram 2D Echocardiogram has been performed.  Leta Jungling M 12/25/2018, 3:21 PM

## 2018-12-25 NOTE — ED Notes (Addendum)
ED TO INPATIENT HANDOFF REPORT  ED Nurse Name and Phone #: Undra Trembath RN  S Name/Rodriguez/Gender Kathleen Rodriguez 53 y.o. female Room/Bed: WA23/WA23  Code Status   Code Status: Full Code  Home/SNF/Other Home Patient oriented to: self, place, time and situation Is this baseline? Yes   Triage Complete: Triage complete  Chief Complaint SOB  Triage Note Patient is from home and transported via [facility]. Patient is experiencing shortness of breath x 2 days. Patient has been using nebulizer treatments with no relief. EMS obtained an IV, administered ALBUTEROL 10mg , ATROVENT 0.5mg , SOLU-MEDROL 125mg , and MAGNESIUM 2g. According to EMS, patient has wheezing and rhonchi in all lung fields.    Allergies Allergies  Allergen Reactions  . Tomato Hives, Itching and Other (See Comments)    ALSO REACTS TO KETCHUP  . Latex Itching and Rash  . Wool Alcohol [Lanolin] Itching    Level of Care/Admitting Diagnosis ED Disposition    ED Disposition Condition Comment   Admit  Hospital Area: [facility] [[id number]]  Level of Care: Stepdown [14]  Admit to SDU based on following criteria: Hemodynamic compromise or significant risk of instability:  Patient requiring short term acute titration and management of vasoactive drips, and invasive monitoring (i.e., CVP and Arterial line).  Diagnosis: Asthma exacerbation [354656]  Admitting Physician: Lorretta Harp [[id number]]  Attending Physician: Lorretta Harp [[id number]]  Estimated length of stay: past midnight tomorrow  Certification:: I certify this patient will need inpatient services for at least 2 midnights  PT Class (Do Not Modify): Inpatient [101]  PT Acc Code (Do Not Modify): Private [1]       B Medical/Surgery History Past Medical History:  Diagnosis Date  . Arthritis    "knees, lower back; legs, ankles" (01/27/2016)  . Asthma    followed by Dr. Craige Cotta  . CHF (congestive heart failure) (HCC) 2016   "when I went into a coma"  . Cocaine  abuse (HCC)   . 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 Dyspnea   . GERD (gastroesophageal reflux disease)   . Hypertension    "doctor took me off RX in 2016" (01/27/2016)  . Influenza B April 2014   Complicated by multi-organ failure  . OSA on CPAP "since " 03/20/2013  . Pneumonia 2016  . Required emergent intubation    asthma exacerbation in 2016  . Tobacco abuse   . Upper airway cough syndrome    Past Surgical History:  Procedure Laterality Date  . BREAST SURGERY Right 1980   as a teenager , cyst was benign  . CESAREAN SECTION  2006  . LACERATION REPAIR Right ~ 1997   "tried to cut myself"  . 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 TUBAL LIGATION  2006     A IV Location/Drains/Wounds Patient Lines/Drains/Airways Status   Active Line/Drains/Airways    Name:   Placement date:   Placement time:   Site:   Days:   Peripheral IV 12/24/18 Left Antecubital   12/24/18    1848    Antecubital   1   Wound / Incision (Open or Dehisced) 08/08/18 Other (Comment) Leg Left;Other (Comment) cellulitis / blisters.  one blister draining calf area   08/08/18    2015    Leg   139          Intake/Output Last 24 hours No  intake or output data in the 24 hours ending 12/25/18 0039  Labs/Imaging Results for orders placed or performed during the hospital encounter of 12/24/18 (from the past 48 hour(s))  Influenza panel by PCR (type A & B)     Status: None   Collection Time: 12/24/18  7:02 PM  Result Value Ref Range   Influenza A By PCR NEGATIVE NEGATIVE   Influenza B By PCR NEGATIVE NEGATIVE    Comment: (NOTE) The Xpert Xpress Flu assay is intended as an aid in the diagnosis of  influenza and should not be used as a sole basis for treatment.  This  assay is FDA approved for nasopharyngeal swab specimens only. Nasal  washings and aspirates are unacceptable for  Xpert Xpress Flu testing. Performed at [facility], [address], [place], [place] [address]   Basic metabolic panel     Status: Abnormal   Collection Time: 12/24/18  7:15 PM  Result Value Ref Range   Sodium 139 135 - 145 mmol/L   Potassium 4.2 3.5 - 5.1 mmol/L   Chloride 107 98 - 111 mmol/L   CO2 25 22 - 32 mmol/L   Glucose, Bld 120 (H) 70 - 99 mg/dL   BUN 13 6 - 20 mg/dL   Creatinine, Ser 6.04 0.44 - 1.00 mg/dL   Calcium 8.1 (L) 8.9 - 10.3 mg/dL   GFR calc non Af Amer >60 >60 mL/min   GFR calc Af Amer >60 >60 mL/min   Anion gap 7 5 - 15    Comment: Performed at [facility], [address] [place], [place] [address]  CBC with Differential     Status: Abnormal   Collection Time: 12/24/18  7:15 PM  Result Value Ref Range   WBC 6.9 4.0 - 10.5 K/uL    Comment: WHITE COUNT CONFIRMED ON SMEAR   RBC 4.45 3.87 - 5.11 MIL/uL   Hemoglobin 12.1 12.0 - 15.0 g/dL   HCT 11.9 14.7 - 82.9 %   MCV 90.1 80.0 - 100.0 fL   MCH 27.2 26.0 - 34.0 pg   MCHC 30.2 30.0 - 36.0 g/dL   RDW 56.2 (H) 13.0 - 86.5 %   Platelets 299 150 - 400 K/uL   nRBC 0.0 0.0 - 0.2 %   Neutrophils Relative % 67 %   Neutro Abs 4.6 1.7 - 7.7 K/uL   Lymphocytes Relative 19 %   Lymphs Abs 1.3 0.7 - 4.0 K/uL   Monocytes Relative 13 %   Monocytes Absolute 0.9 0.1 - 1.0 K/uL   Eosinophils Relative 0 %   Eosinophils Absolute 0.0 0.0 - 0.5 K/uL   Basophils Relative 1 %   Basophils Absolute 0.1 0.0 - 0.1 K/uL   Immature Granulocytes 0 %   Abs Immature Granulocytes 0.02 0.00 - 0.07 K/uL    Comment: Performed at [facility], [address], [place], [place] [address]  Brain natriuretic peptide     Status: None   Collection Time: 12/24/18  7:15 PM  Result Value Ref Range   B Natriuretic Peptide 46.9 0.0 - 100.0 pg/mL    Comment: Performed at [facility], [address] [place], [place] [address]  Blood gas, venous     Status: None    Collection Time: 12/24/18  7:23 PM  Result Value Ref Range   FIO2 21.00    pH, Ven 7.319 7.250 - 7.430   pCO2, Ven 53.9 44.0 - 60.0 mmHg  pO2, Ven 43.7 32.0 - 45.0 mmHg   Bicarbonate 26.9 20.0 - 28.0 mmol/L   Acid-Base Excess 0.5 0.0 - 2.0 mmol/L   O2 Saturation 71.9 %   Patient temperature 98.6    Collection site VEIN    Drawn by COLLECTED BY NURSE    Sample type VENOUS     Comment: Performed at [facility], [address] [place], [place] [address]  I-Stat Troponin, ED (not at [facility])     Status: None   Collection Time: 12/24/18  7:28 PM  Result Value Ref Range   Troponin i, poc 0.00 0.00 - 0.08 ng/mL   Comment 3            Comment: Due to the release kinetics of cTnI, a negative result within the first hours of the onset of symptoms does not rule out myocardial infarction with certainty. If myocardial infarction is still suspected, repeat the test at appropriate intervals.    Dg Chest Port 1 View  Result Date: 12/24/2018 CLINICAL DATA:  Shortness of breath for 2 days. No relief with nebulizer treatments. EXAM: PORTABLE CHEST 1 VIEW COMPARISON:  Two-view chest x-ray 07/30/2018 FINDINGS: The cardiac silhouette has increased in size since the prior study. There is some straightening of the left border. No edema or effusion is present. No focal airspace disease is present the visualized soft tissues and bony thorax are unremarkable. IMPRESSION: 1. Slight increase in size and fullness of the cardiac silhouette. Pericardial effusion is not excluded. Consider echocardiogram for further evaluation. 2. No edema or effusion to suggest failure. No focal airspace disease. Electronically Signed   By: Marin Roberts M.D.   On: 12/24/2018 19:25    Pending Labs Unresulted Labs (From admission, onward)    Start     Ordered   12/25/18 0500  Hemoglobin A1c  Tomorrow morning,   R     12/24/18 2344   12/25/18 0500  Lipid panel  Tomorrow morning,   R    Comments:  Please  obtain as a fasting lipid panel - should not have eaten/ drank food for 8 hours prior to labs.    12/24/18 2344   12/25/18 0500  Basic metabolic panel  Tomorrow morning,   R     12/24/18 2348   12/25/18 0500  CBC  Tomorrow morning,   R     12/24/18 2348   12/24/18 2345  Troponin I - Now Then Q6H  Now then every 6 hours,   R     12/24/18 2344          Vitals/Pain Today's Vitals   12/24/18 2331 12/24/18 2331 12/24/18 2356 12/25/18 0030  BP:  (!) 160/99  138/86  Pulse:  (!) 127 (!) 117 (!) 117  Resp:  (!) 23 (!) 21 (!) 25  Temp:      TempSrc:      SpO2:  93% 96% 98%  Weight:      Height:      PainSc: 0-No pain       Isolation Precautions No active isolations  Medications Medications  montelukast (SINGULAIR) tablet 10 mg (has no administration in time range)  cloNIDine (CATAPRES) tablet 0.1 mg (has no administration in time range)  gabapentin (NEURONTIN) capsule 300 mg (has no administration in time range)  methocarbamol (ROBAXIN) tablet 500 mg (has no administration in time range)  fluticasone (FLONASE) 50 MCG/ACT nasal spray 1 spray (has no administration in time range)  Menthol LOZG (has no administration  in time range)  ipratropium (ATROVENT) nebulizer solution 0.5 mg (0.5 mg Nebulization Given 12/25/18 0034)  levalbuterol (XOPENEX) nebulizer solution 1.25 mg (1.25 mg Nebulization Not Given 12/25/18 0035)  methylPREDNISolone sodium succinate (SOLU-MEDROL) 125 mg/2 mL injection 60 mg (has no administration in time range)  dextromethorphan-guaiFENesin (MUCINEX DM) 30-600 MG per 12 hr tablet 1 tablet (has no administration in time range)  nitroGLYCERIN (NITROSTAT) SL tablet 0.4 mg (has no administration in time range)  aspirin EC tablet 81 mg (has no administration in time range)  enoxaparin (LOVENOX) injection 40 mg (has no administration in time range)  acetaminophen (TYLENOL) tablet 650 mg (has no administration in time range)    Or  acetaminophen (TYLENOL) suppository 650  mg (has no administration in time range)  ondansetron (ZOFRAN) tablet 4 mg (has no administration in time range)    Or  ondansetron (ZOFRAN) injection 4 mg (has no administration in time range)  hydrALAZINE (APRESOLINE) injection 5 mg (has no administration in time range)  ipratropium (ATROVENT) 0.02 % nebulizer solution (  Not Given 12/25/18 0035)  albuterol (PROVENTIL) (2.5 MG/3ML) 0.083% nebulizer solution 5 mg (5 mg Nebulization Given 12/24/18 1902)  albuterol (PROVENTIL,VENTOLIN) solution continuous neb (15 mg/hr Nebulization Given 12/24/18 2106)  ipratropium (ATROVENT) nebulizer solution 1 mg (1 mg Nebulization Given 12/24/18 2106)  levalbuterol (XOPENEX) 1.25 MG/0.5ML nebulizer solution (1.25 mg  Given 12/25/18 0034)    Mobility walks Low fall risk   Focused Assessments Pulmonary Assessment Handoff:  Lung sounds: Bilateral Breath Sounds: Diminished, Expiratory wheezes L Breath Sounds: Rhonchi, Expiratory wheezes R Breath Sounds: Rhonchi, Expiratory wheezes O2 Device: Room Air        R Recommendations: See Admitting Provider Note  Report given to: Britta Mccreedy, RN  Additional Notes:

## 2018-12-25 NOTE — Progress Notes (Signed)
Patient requesting to have scheduled breathing txs Q2 hours. Scheduled nebs adjusted to Q4 and Q2PRN. Patient denies need for BiPAP at this time. I/E wheezes present. RT will continue to monitor patient.

## 2018-12-25 NOTE — Progress Notes (Signed)
Pt seen, awake, watching tv, found on 3lnc.  HR104, RR25, spo2 95%.  No increased wob noted.  Pt stated she wants to go back on bipap around midnight tonight after her bedtime meds and next scheduled nebulizer treatment.  RT will return and assist with bipap later per pt request.  RN aware.

## 2018-12-25 NOTE — Progress Notes (Addendum)
PROGRESS NOTE    Kathleen Rodriguez  ZOX:[id number] DOB: 1965-12-09 DOA: 12/24/2018 PCP: Hoy Register, MD     Brief Narrative:  Kathleen Rodriguez is a 53 y.o. female with medical history significant of hypertension, former smoker, asthma/COPD, GERD, cocaine abuse, tobacco abuse, OSA on CPAP, who presents with cough, shortness breath and chest pain.   Assessment & Plan:   Principal Problem:   Asthma exacerbation Active Problems:   OSA (obstructive sleep apnea)   GERD (gastroesophageal reflux disease)   COPD exacerbation (HCC)   Essential hypertension   Anemia, iron deficiency   Acute on chronic respiratory failure with hypoxia (HCC)   Chest pain    Acute on chronic respiratory failure with hypoxia probably secondary to acute asthma exacerbation with bronchitis Started the patient on IV Solu-Medrol 60 mg every 8 hours, continue the same as patient continues to have diffuse expiratory wheezing both anteriorly and posteriorly.  Continue with duo nebs every 4 hours as needed and cough medication. Nasal cannula oxygen to keep sats greater than 90%.  Influenza PCR is negative. Try to wean her off the oxygen in the next 24 hours if possible.    Lower extremity neuropathy continue with gabapentin increase the dose to 3 times daily.    Obstructive sleep apnea on CPAP at home   Iron deficiency anemia Monitor hemoglobin PRN    Accelerated hypertension Continue with clonidine and PRN hydralazine.  GERD Stable continue with Protonix  Atypical chest pain secondary to persistent cough and possibly costochondritis.  Troponins negative PRN Tylenol.  Tooth pain  PRN Orajel   DVT prophylaxis: Lovenox Code Status: Full code Family Communication: None at bedside  disposition Plan: Pending clinical improvement  Consultants:   None  Procedures:  Antimicrobials:  zithromax one dose.  Augmentin from 3/3     Subjective: No chest pain. Still sob. Bilateral exp wheezing.     Objective: Vitals:   12/25/18 1200 12/25/18 1204 12/25/18 1300 12/25/18 1419  BP:  (!) 172/79    Pulse:      Resp:  (!) 22 (!) 29   Temp: 98.2 F (36.8 C)     TempSrc: Axillary     SpO2:    95%  Weight:      Height:        Intake/Output Summary (Last 24 hours) at 12/25/2018 1520 Last data filed at 12/25/2018 0200 Gross per 24 hour  Intake 200 ml  Output -  Net 200 ml   Filed Weights   12/24/18 1900 12/25/18 0100  Weight: 115.2 kg 115.2 kg    Examination:  General exam: anxious, in moderate distress from SOB.  Respiratory system: exp wheezing bilaterally, with scattered rhonchi.  Cardiovascular system: S1 & S2 heard, tachycardia. No JVD, . No pedal edema. Gastrointestinal system: Abdomen is nondistended, soft and nontender. No organomegaly or masses felt. Normal bowel sounds heard. Central nervous system: Alert and oriented. Non focal.  Extremities: Symmetric 5 x 5 power. Skin: No rashes, lesions or ulcers Psychiatry: anxious     Data Reviewed: I have personally reviewed following labs and imaging studies  CBC: Recent Labs  Lab 12/24/18 1915 12/25/18 0650  WBC 6.9 6.2  NEUTROABS 4.6  --   HGB 12.1 11.8*  HCT 40.1 37.9  MCV 90.1 87.3  PLT 299 311   Basic Metabolic Panel: Recent Labs  Lab 12/24/18 1915 12/25/18 0650  NA 139 138  K 4.2 4.3  CL 107 108  CO2 25 23  GLUCOSE 120* 126*  BUN 13 14  CREATININE 0.95 0.62  CALCIUM 8.1* 8.5*   GFR: Estimated Creatinine Clearance: 103.1 mL/min (by C-G formula based on SCr of 0.62 mg/dL). Liver Function Tests: No results for input(s): AST, ALT, ALKPHOS, BILITOT, PROT, ALBUMIN in the last 168 hours. No results for input(s): LIPASE, AMYLASE in the last 168 hours. No results for input(s): AMMONIA in the last 168 hours. Coagulation Profile: No results for input(s): INR, PROTIME in the last 168 hours. Cardiac Enzymes: Recent Labs  Lab 12/25/18 0154 12/25/18 0650  TROPONINI <0.03 <0.03  <0.03   BNP (last 3  results) No results for input(s): PROBNP in the last 8760 hours. HbA1C: No results for input(s): HGBA1C in the last 72 hours. CBG: No results for input(s): GLUCAP in the last 168 hours. Lipid Profile: Recent Labs    12/25/18 0650  CHOL 132  HDL 60  LDLCALC 69  TRIG 17  CHOLHDL 2.2   Thyroid Function Tests: No results for input(s): TSH, T4TOTAL, FREET4, T3FREE, THYROIDAB in the last 72 hours. Anemia Panel: No results for input(s): VITAMINB12, FOLATE, FERRITIN, TIBC, IRON, RETICCTPCT in the last 72 hours. Sepsis Labs: No results for input(s): PROCALCITON, LATICACIDVEN in the last 168 hours.  Recent Results (from the past 240 hour(s))  MRSA PCR Screening     Status: None   Collection Time: 12/25/18  2:32 AM  Result Value Ref Range Status   MRSA by PCR NEGATIVE NEGATIVE Final    Comment:        The GeneXpert MRSA Assay (FDA approved for NASAL specimens only), is one component of a comprehensive MRSA colonization surveillance program. It is not intended to diagnose MRSA infection nor to guide or monitor treatment for MRSA infections. Performed at [facility], [address] [place], [place] [address]          Radiology Studies: Dg Chest Port 1 View  Result Date: 12/24/2018 CLINICAL DATA:  Shortness of breath for 2 days. No relief with nebulizer treatments. EXAM: PORTABLE CHEST 1 VIEW COMPARISON:  Two-view chest x-ray 07/30/2018 FINDINGS: The cardiac silhouette has increased in size since the prior study. There is some straightening of the left border. No edema or effusion is present. No focal airspace disease is present the visualized soft tissues and bony thorax are unremarkable. IMPRESSION: 1. Slight increase in size and fullness of the cardiac silhouette. Pericardial effusion is not excluded. Consider echocardiogram for further evaluation. 2. No edema or effusion to suggest failure. No focal airspace disease. Electronically Signed   By: Marin Roberts M.D.   On: 12/24/2018 19:25        Scheduled Meds: . aspirin EC  81 mg Oral Daily  . [START ON 12/26/2018] azithromycin  250 mg Oral Daily  . [START ON 12/26/2018] Chlorhexidine Gluconate Cloth  6 each Topical Daily  . cloNIDine  0.1 mg Oral QHS  . enoxaparin (LOVENOX) injection  40 mg Subcutaneous Daily  . gabapentin  300 mg Oral TID  . ipratropium  0.5 mg Nebulization Q4H  . levalbuterol  1.25 mg Nebulization Q4H  . methylPREDNISolone (SOLU-MEDROL) injection  60 mg Intravenous TID  . montelukast  10 mg Oral QHS  . pantoprazole  40 mg Oral Q1200  . polyethylene glycol  17 g Oral Daily  . senna-docusate  2 tablet Oral BID   Continuous Infusions:   LOS: 1 day    Time spent: 36 minutes.     Kathlen Mody, MD [facility] Pager [phone]   If  7PM-7AM, please contact night-coverage [email or web address] Password [email or web address] 12/25/2018, 3:20 PM

## 2018-12-26 LAB — HEMOGLOBIN A1C
Hgb A1c MFr Bld: 5.8 % — ABNORMAL HIGH (ref 4.8–5.6)
Mean Plasma Glucose: 120 mg/dL

## 2018-12-26 MED ORDER — CLONIDINE HCL 0.1 MG PO TABS
0.1000 mg | ORAL_TABLET | Freq: Two times a day (BID) | ORAL | Status: DC
  Administered 2018-12-26 – 2018-12-27 (×3): 0.1 mg via ORAL
  Filled 2018-12-26 (×3): qty 1

## 2018-12-26 MED ORDER — BENZONATATE 100 MG PO CAPS
200.0000 mg | ORAL_CAPSULE | Freq: Three times a day (TID) | ORAL | Status: DC
  Administered 2018-12-26 – 2018-12-27 (×3): 200 mg via ORAL
  Filled 2018-12-26 (×3): qty 2

## 2018-12-26 MED ORDER — GUAIFENESIN 100 MG/5ML PO SOLN
10.0000 mL | Freq: Four times a day (QID) | ORAL | Status: DC | PRN
  Administered 2018-12-26: 200 mg via ORAL
  Filled 2018-12-26: qty 10

## 2018-12-26 MED ORDER — SALINE SPRAY 0.65 % NA SOLN
1.0000 | NASAL | Status: DC | PRN
  Administered 2018-12-26: 1 via NASAL
  Filled 2018-12-26: qty 44

## 2018-12-26 NOTE — Progress Notes (Signed)
Pt refused flutter valve. Pt states she has several at home.

## 2018-12-26 NOTE — Progress Notes (Addendum)
PROGRESS NOTE  Kathleen Rodriguez JDB:[id number] DOB: 1966/06/01 DOA: 12/24/2018 PCP: Hoy Register, MD  HPI/Recap of past 24 hours: Kathleen Bond Jonesis a 53 y.o.femalewith medical history significant ofhypertension, former smoker, asthma/COPD, GERD, cocaine abuse, tobacco abuse, OSA on CPAP, who presents with cough, shortness breath and chest pain.  12/26/18: Patient seen and examined at bedside.  No acute events overnight.  Breathing is improving however still wheezing.  Ambulating in the room with noted improvement.  We will transfer to telemetry floor from stepdown unit.  We will continue to monitor overnight.  Will encourage patient to wear CPAP machine at night for OSA.  Assessment/Plan: Principal Problem:   Asthma exacerbation Active Problems:   OSA (obstructive sleep apnea)   GERD (gastroesophageal reflux disease)   COPD exacerbation (HCC)   Essential hypertension   Anemia, iron deficiency   Acute on chronic respiratory failure with hypoxia (HCC)   Chest pain  Acute on chronic hypoxic respiratory failure secondary to acute asthma exacerbation with bronchitis Continue Solu-Medrol and wean down as tolerated Continue Augmentin twice daily day number 1 Continue duo nebs as needed Continue antitussives medications Continue to monitor O2 saturation greater than 90% Home O2 evaluation for discharge planning  OSA with noncompliance with CPAP Encourage compliance with CPAP machine CPAP nightly  Morbid obesity BMI 42 Recommend weight loss outpatient with regular physical activity and healthy dieting  Accelerated hypertension Blood pressure persistently elevated Continue clonidine 0.1, increase to twice daily instead of nightly.   Continue IV hydralazine PRN  GERD Stable continue with Protonix  Atypical chest pain secondary to persistent cough and possibly costochondritis.  Troponins negative x3 PRN Tylenol.  Tooth pain  PRN Orajel   DVT prophylaxis:  Subcu  Lovenox daily  Code Status: Full code Family Communication: None at bedside  disposition Plan:  To home possibly tomorrow 12/27/2018  Consultants:   None  Procedures:  Antimicrobials:  zithromax one dose.  Augmentin from 3/3 >>>      Objective: Vitals:   12/26/18 0356 12/26/18 0740 12/26/18 0800 12/26/18 0910  BP: (!) 165/101   (!) 158/94  Pulse: (!) 107  (!) 107 (!) 107  Resp: (!) 21  17 17   Temp:   97.7 F (36.5 C)   TempSrc:   Oral   SpO2: 96% 97% 100% 100%  Weight:      Height:        Intake/Output Summary (Last 24 hours) at 12/26/2018 1120 Last data filed at 12/25/2018 1800 Gross per 24 hour  Intake 350 ml  Output 1000 ml  Net -650 ml   Filed Weights   12/24/18 1900 12/25/18 0100  Weight: 115.2 kg 115.2 kg    Exam:  . General: 53 y.o. year-old female severely obese in no acute distress.  Alert and oriented x3. . Cardiovascular: Regular rate and rhythm with no rubs or gallops.  No thyromegaly or JVD noted.   Marland Kitchen Respiratory: Diffuse wheezing bilaterally.  Good inspiratory effort. . Abdomen: Soft nontender nondistended with normal bowel sounds x4 quadrants. . Musculoskeletal: Trace lower extremity edema. 2/4 pulses in all 4 extremities. Marland Kitchen Psychiatry: Mood is appropriate for condition and setting   Data Reviewed: CBC: Recent Labs  Lab 12/24/18 1915 12/25/18 0650  WBC 6.9 6.2  NEUTROABS 4.6  --   HGB 12.1 11.8*  HCT 40.1 37.9  MCV 90.1 87.3  PLT 299 311   Basic Metabolic Panel: Recent Labs  Lab 12/24/18 1915 12/25/18 0650  NA 139 138  K 4.2  4.3  CL 107 108  CO2 25 23  GLUCOSE 120* 126*  BUN 13 14  CREATININE 0.95 0.62  CALCIUM 8.1* 8.5*   GFR: Estimated Creatinine Clearance: 103.1 mL/min (by C-G formula based on SCr of 0.62 mg/dL). Liver Function Tests: No results for input(s): AST, ALT, ALKPHOS, BILITOT, PROT, ALBUMIN in the last 168 hours. No results for input(s): LIPASE, AMYLASE in the last 168 hours. No results for input(s):  AMMONIA in the last 168 hours. Coagulation Profile: No results for input(s): INR, PROTIME in the last 168 hours. Cardiac Enzymes: Recent Labs  Lab 12/25/18 0154 12/25/18 0650  TROPONINI <0.03 <0.03  <0.03   BNP (last 3 results) No results for input(s): PROBNP in the last 8760 hours. HbA1C: Recent Labs    12/25/18 0154  HGBA1C 5.8*   CBG: No results for input(s): GLUCAP in the last 168 hours. Lipid Profile: Recent Labs    12/25/18 0650  CHOL 132  HDL 60  LDLCALC 69  TRIG 17  CHOLHDL 2.2   Thyroid Function Tests: No results for input(s): TSH, T4TOTAL, FREET4, T3FREE, THYROIDAB in the last 72 hours. Anemia Panel: No results for input(s): VITAMINB12, FOLATE, FERRITIN, TIBC, IRON, RETICCTPCT in the last 72 hours. Urine analysis:    Component Value Date/Time   COLORURINE YELLOW 08/07/2018 1752   APPEARANCEUR CLEAR 08/07/2018 1752   LABSPEC 1.033 (H) 08/07/2018 1752   PHURINE 5.0 08/07/2018 1752   GLUCOSEU NEGATIVE 08/07/2018 1752   HGBUR NEGATIVE 08/07/2018 1752   BILIRUBINUR NEGATIVE 08/07/2018 1752   BILIRUBINUR small 05/26/2017 1539   KETONESUR NEGATIVE 08/07/2018 1752   PROTEINUR NEGATIVE 08/07/2018 1752   UROBILINOGEN 1.0 05/26/2017 1539   UROBILINOGEN 1.0 03/22/2014 0805   NITRITE NEGATIVE 08/07/2018 1752   LEUKOCYTESUR NEGATIVE 08/07/2018 1752   Sepsis Labs: @LABRCNTIP (procalcitonin:4,lacticidven:4)  ) Recent Results (from the past 240 hour(s))  MRSA PCR Screening     Status: None   Collection Time: 12/25/18  2:32 AM  Result Value Ref Range Status   MRSA by PCR NEGATIVE NEGATIVE Final    Comment:        The GeneXpert MRSA Assay (FDA approved for NASAL specimens only), is one component of a comprehensive MRSA colonization surveillance program. It is not intended to diagnose MRSA infection nor to guide or monitor treatment for MRSA infections. Performed at [facility], [address], [place], [place] [address]        Studies: No results found.  Scheduled Meds: . amoxicillin-clavulanate  1 tablet Oral Q12H  . aspirin EC  81 mg Oral Daily  . Chlorhexidine Gluconate Cloth  6 each Topical Daily  . cloNIDine  0.1 mg Oral QHS  . enoxaparin (LOVENOX) injection  40 mg Subcutaneous Daily  . gabapentin  300 mg Oral TID  . ipratropium  0.5 mg Nebulization Q4H  . levalbuterol  1.25 mg Nebulization Q4H  . methylPREDNISolone (SOLU-MEDROL) injection  60 mg Intravenous TID  . montelukast  10 mg Oral QHS  . pantoprazole  40 mg Oral Q1200  . polyethylene glycol  17 g Oral Daily  . senna-docusate  2 tablet Oral BID    Continuous Infusions:   LOS: 2 days     Darlin Drop, MD [facility] Pager [phone]  If 7PM-7AM, please contact night-coverage [email or web address] Password [email or web address] 12/26/2018, 11:20 AM

## 2018-12-27 MED ORDER — FLUCONAZOLE 150 MG PO TABS: 150.0000 mg | ORAL_TABLET | Freq: Every day | ORAL | 0 refills | Status: DC

## 2018-12-27 MED ORDER — BENZONATATE 200 MG PO CAPS: 200.0000 mg | ORAL_CAPSULE | Freq: Two times a day (BID) | ORAL | 0 refills | Status: DC | PRN

## 2018-12-27 MED ORDER — ASPIRIN 81 MG PO TBEC: 81.0000 mg | DELAYED_RELEASE_TABLET | Freq: Every day | ORAL | 0 refills | Status: DC

## 2018-12-27 MED ORDER — FLUCONAZOLE 150 MG PO TABS
150.0000 mg | ORAL_TABLET | Freq: Every day | ORAL | Status: DC
  Administered 2018-12-27: 150 mg via ORAL
  Filled 2018-12-27: qty 1

## 2018-12-27 MED ORDER — PREDNISONE 5 MG PO TABS
10.0000 mg | ORAL_TABLET | Freq: Every day | ORAL | Status: DC
  Administered 2018-12-27: 10 mg via ORAL
  Filled 2018-12-27: qty 2

## 2018-12-27 MED ORDER — AMOXICILLIN-POT CLAVULANATE 875-125 MG PO TABS: 1.0000 | ORAL_TABLET | Freq: Two times a day (BID) | ORAL | 0 refills | Status: AC

## 2018-12-27 MED ORDER — IPRATROPIUM BROMIDE 0.02 % IN SOLN: 0.5000 mg | Freq: Two times a day (BID) | RESPIRATORY_TRACT | 0 refills | Status: DC | PRN

## 2018-12-27 MED ORDER — ALBUTEROL SULFATE (2.5 MG/3ML) 0.083% IN NEBU: 2.5000 mg | INHALATION_SOLUTION | Freq: Two times a day (BID) | RESPIRATORY_TRACT | 0 refills | Status: DC | PRN

## 2018-12-27 MED ORDER — CLONIDINE HCL 0.1 MG PO TABS: 0.1000 mg | ORAL_TABLET | Freq: Two times a day (BID) | ORAL | 0 refills | Status: DC

## 2018-12-27 MED ORDER — LEVALBUTEROL HCL 1.25 MG/0.5ML IN NEBU: 1.2500 mg | INHALATION_SOLUTION | Freq: Two times a day (BID) | RESPIRATORY_TRACT | 0 refills | Status: DC | PRN

## 2018-12-27 MED ORDER — PANTOPRAZOLE SODIUM 40 MG PO TBEC: 40.0000 mg | DELAYED_RELEASE_TABLET | Freq: Every day | ORAL | 0 refills | Status: DC

## 2018-12-27 MED ORDER — PREDNISONE 10 MG PO TABS: 10.0000 mg | ORAL_TABLET | Freq: Every day | ORAL | 0 refills | Status: AC

## 2018-12-27 NOTE — Discharge Summary (Signed)
Discharge Summary  Kathleen Rodriguez:[id number] DOB: 11/01/1965  PCP: Hoy Register, MD  Admit date: 12/24/2018 Discharge date: 12/27/2018  Time spent: 35 minutes  Recommendations for Outpatient Follow-up:  1. Follow-up with your PCP 2. Follow-up with your pulmonologist 3. Take your medications as prescribed  Discharge Diagnoses:  Active Hospital Problems   Diagnosis Date Noted  . Asthma exacerbation 04/06/2014  . Chest pain 12/25/2018  . Acute on chronic respiratory failure with hypoxia (HCC) 12/24/2018  . Anemia, iron deficiency 08/12/2015  . Essential hypertension 07/25/2015  . COPD exacerbation (HCC) 12/28/2014  . GERD (gastroesophageal reflux disease) 04/08/2014  . OSA (obstructive sleep apnea) 03/20/2013    Resolved Hospital Problems  No resolved problems to display.    Discharge Condition: Stable  Diet recommendation: Resume previous diet  Vitals:   12/27/18 0420 12/27/18 0737  BP: (!) 143/87   Pulse: 80   Resp: 18   Temp: 97.7 F (36.5 C)   SpO2: 98% 95%    History of present illness:  Kathleen Rodriguez a 53 y.o.femalewith medical history significant ofhypertension, morbid obesity, former smoker, asthma/COPD, GERD, cocaine abuse, tobacco abuse, OSA on CPAP, who presents with cough, shortness breath and chest pain.  Admitted for acute asthma exacerbation with bronchitis complicated by acute hypoxic respiratory failure.  12/27/18: Patient seen and examined at her bedside.  No acute events overnight.  She has no new complaints.  Her breathing is improved.  She has been able to ambulate without any difficulty.  On the day of discharge, the patient was hemodynamically stable.  She will need to follow-up with her primary care provider and pulmonologist posthospitalization.  She will also need to take her medications as prescribed and use her CPAP at night.    Hospital Course:  Principal Problem:   Asthma exacerbation Active Problems:   OSA (obstructive  sleep apnea)   GERD (gastroesophageal reflux disease)   COPD exacerbation (HCC)   Essential hypertension   Anemia, iron deficiency   Acute on chronic respiratory failure with hypoxia (HCC)   Chest pain  Acute hypoxic respiratory failure secondary to acute asthma exacerbation with bronchitis Completed IV Solu-Medrol Started on oral prednisone on 12/27/2018 Continue prednisone x5 days Follow-up with your PCP and pulmonologist Continue Augmentin twice daily x3 more days Continue duo nebs as needed Continue antitussives medications as needed  OSA with noncompliance with CPAP Continue CPAP at night  Morbid obesity BMI 42 Recommend weight loss outpatient with regular physical activity and healthy dieting  Accelerated hypertension Blood pressure is improved after increasing frequency of clonidine from 0.1 daily to 0.1 mg twice daily Follow-up with your PCP posthospitalization  GERD Stable Continue with Protonix  Atypical chest pain secondary to persistent cough and possibly costochondritis.  Troponins negative x3 Twelve-lead EKG shows sinus tachycardia rate of 106 with no specific ST-T changes PRN Tylenol.  Tooth pain PRN Orajel Follow-up with a dentist outpatient    Code Status:Full code   Consultants:  None   Antimicrobials:Zithromax one dose.  Augmentin from 3/3>>>12/26/18   Discharge Exam: BP (!) 143/87 (BP Location: Right Arm)   Pulse 80   Temp 97.7 F (36.5 C)   Resp 18   Ht 5\' 5"  (1.651 m)   Wt 115.2 kg   SpO2 95%   BMI 42.26 kg/m  . General: 53 y.o. year-old female well developed well nourished in no acute distress.  Alert and oriented x3. . Cardiovascular: Regular rate and rhythm with no rubs or gallops.  No thyromegaly  or JVD noted.   Marland Kitchen Respiratory: Clear to auscultation with no wheezes or rales. Good inspiratory effort. . Abdomen: Soft nontender nondistended with normal bowel sounds x4 quadrants. . Musculoskeletal: No lower  extremity edema. 2/4 pulses in all 4 extremities. . Skin: No ulcerative lesions noted or rashes, . Psychiatry: Mood is appropriate for condition and setting  Discharge Instructions You were cared for by a hospitalist during your hospital stay. If you have any questions about your discharge medications or the care you received while you were in the hospital after you are discharged, you can call the unit and asked to speak with the hospitalist on call if the hospitalist that took care of you is not available. Once you are discharged, your primary care physician will handle any further medical issues. Please note that NO REFILLS for any discharge medications will be authorized once you are discharged, as it is imperative that you return to your primary care physician (or establish a relationship with a primary care physician if you do not have one) for your aftercare needs so that they can reassess your need for medications and monitor your lab values.   Allergies as of 12/27/2018      Reactions   Tomato Hives, Itching, Other (See Comments)   ALSO REACTS TO KETCHUP   Latex Itching, Rash   Wool Alcohol [lanolin] Itching      Medication List    STOP taking these medications   cephALEXin 500 MG capsule Commonly known as:  KEFLEX   hydrOXYzine 25 MG tablet Commonly known as:  ATARAX/VISTARIL   montelukast 10 MG tablet Commonly known as:  SINGULAIR   terbinafine 1 % cream Commonly known as:  LAMISIL AT   traMADol 50 MG tablet Commonly known as:  ULTRAM     TAKE these medications   acetaminophen 500 MG tablet Commonly known as:  TYLENOL Take 500 mg by mouth every 6 (six) hours as needed for mild pain.   AEROCHAMBER MV inhaler Use as instructed   albuterol 108 (90 Base) MCG/ACT inhaler Commonly known as:  PROVENTIL HFA;VENTOLIN HFA Inhale 2 puffs into the lungs every 6 (six) hours as needed for wheezing or shortness of breath. What changed:  Another medication with the same name  was removed. Continue taking this medication, and follow the directions you see here.   amoxicillin-clavulanate 875-125 MG tablet Commonly known as:  AUGMENTIN Take 1 tablet by mouth 2 (two) times daily for 3 days.   aspirin 81 MG EC tablet Take 1 tablet (81 mg total) by mouth daily.   benzonatate 200 MG capsule Commonly known as:  TESSALON Take 1 capsule (200 mg total) by mouth 2 (two) times daily as needed for cough.   budesonide 0.25 MG/2ML nebulizer solution Commonly known as:  PULMICORT Take 2 mLs (0.25 mg total) by nebulization 2 (two) times daily. What changed:    when to take this  reasons to take this   cloNIDine 0.1 MG tablet Commonly known as:  CATAPRES Take 1 tablet (0.1 mg total) by mouth 2 (two) times daily. What changed:    when to take this  additional instructions   fluconazole 150 MG tablet Commonly known as:  DIFLUCAN Take 1 tablet (150 mg total) by mouth daily.   fluticasone 50 MCG/ACT nasal spray Commonly known as:  FLONASE Place 1 spray into both nostrils daily. What changed:    when to take this  reasons to take this   gabapentin 300 MG capsule Commonly known  as:  NEURONTIN Take 1 capsule (300 mg total) by mouth 2 (two) times daily.   HALLS COUGH DROPS MT Use as directed 1 drop in the mouth or throat 2 (two) times daily as needed (sore throat and cough).   ipratropium 0.02 % nebulizer solution Commonly known as:  ATROVENT Take 2.5 mLs (0.5 mg total) by nebulization 2 (two) times daily as needed for wheezing or shortness of breath.   levalbuterol 1.25 MG/0.5ML nebulizer solution Commonly known as:  XOPENEX Take 1.25 mg by nebulization 2 (two) times daily as needed for wheezing or shortness of breath.   methocarbamol 500 MG tablet Commonly known as:  ROBAXIN Take 1 tablet (500 mg total) by mouth every 8 (eight) hours as needed for muscle spasms.   pantoprazole 40 MG tablet Commonly known as:  PROTONIX Take 1 tablet (40 mg total) by  mouth daily at 12 noon.   predniSONE 10 MG tablet Commonly known as:  DELTASONE Take 1 tablet (10 mg total) by mouth daily with breakfast for 5 days.   VICKS VAPORUB EX Apply 1 application topically daily as needed (congestion).            Durable Medical Equipment  (From admission, onward)         Start     Ordered   12/27/18 1019  For home use only DME Nebulizer machine  Once    Question:  Patient needs a nebulizer to treat with the following condition  Answer:  COPD exacerbation (HCC)   12/27/18 1020         Allergies  Allergen Reactions  . Tomato Hives, Itching and Other (See Comments)    ALSO REACTS TO KETCHUP  . Latex Itching and Rash  . Wool Alcohol [Lanolin] Itching   Follow-up Information    Hoy Register, MD. Call in 1 day(s).   Specialty:  Family Medicine Why:  Please call for a post hospital follow-up appointment. Contact information: [address] [place] [address] [phone]            The results of significant diagnostics from this hospitalization (including imaging, microbiology, ancillary and laboratory) are listed below for reference.    Significant Diagnostic Studies: Dg Chest Port 1 View  Result Date: 12/24/2018 CLINICAL DATA:  Shortness of breath for 2 days. No relief with nebulizer treatments. EXAM: PORTABLE CHEST 1 VIEW COMPARISON:  Two-view chest x-ray 07/30/2018 FINDINGS: The cardiac silhouette has increased in size since the prior study. There is some straightening of the left border. No edema or effusion is present. No focal airspace disease is present the visualized soft tissues and bony thorax are unremarkable. IMPRESSION: 1. Slight increase in size and fullness of the cardiac silhouette. Pericardial effusion is not excluded. Consider echocardiogram for further evaluation. 2. No edema or effusion to suggest failure. No focal airspace disease. Electronically Signed   By: Marin Roberts M.D.   On: 12/24/2018 19:25      Microbiology: Recent Results (from the past 240 hour(s))  MRSA PCR Screening     Status: None   Collection Time: 12/25/18  2:32 AM  Result Value Ref Range Status   MRSA by PCR NEGATIVE NEGATIVE Final    Comment:        The GeneXpert MRSA Assay (FDA approved for NASAL specimens only), is one component of a comprehensive MRSA colonization surveillance program. It is not intended to diagnose MRSA infection nor to guide or monitor treatment for MRSA infections. Performed at [facility], [address] [place], [place] [address]      Labs: Basic Metabolic Panel: Recent Labs  Lab 12/24/18 1915 12/25/18 0650  NA 139 138  K 4.2 4.3  CL 107 108  CO2 25 23  GLUCOSE 120* 126*  BUN 13 14  CREATININE 0.95 0.62  CALCIUM 8.1* 8.5*   Liver Function Tests: No results for input(s): AST, ALT, ALKPHOS, BILITOT, PROT, ALBUMIN in the last 168 hours. No results for input(s): LIPASE, AMYLASE in the last 168 hours. No results for input(s): AMMONIA in the last 168 hours. CBC: Recent Labs  Lab 12/24/18 1915 12/25/18 0650  WBC 6.9 6.2  NEUTROABS 4.6  --   HGB 12.1 11.8*  HCT 40.1 37.9  MCV 90.1 87.3  PLT 299 311   Cardiac Enzymes: Recent Labs  Lab 12/25/18 0154 12/25/18 0650  TROPONINI <0.03 <0.03  <0.03   BNP: BNP (last 3 results) Recent Labs    08/07/18 2216 12/24/18 1915  BNP 51.5 46.9    ProBNP (last 3 results) No results for input(s): PROBNP in the last 8760 hours.  CBG: No results for input(s): GLUCAP in the last 168 hours.     Signed:  Darlin Drop, MD [facility] 12/27/2018, 10:33 AM

## 2018-12-27 NOTE — Discharge Instructions (Signed)
Asthma Attack Prevention, Adult °Although you may not be able to control the fact that you have asthma, you can take actions to prevent episodes of asthma (asthma attacks). These actions include: °· Creating a written plan for managing and treating your asthma attacks (asthma action plan). °· Monitoring your asthma. °· Avoiding things that can irritate your airways or make your asthma symptoms worse (asthma triggers). °· Taking your medicines as directed. °· Acting quickly if you have signs or symptoms of an asthma attack. °What are some ways to prevent an asthma attack? °Create a plan °Work with your health care provider to create an asthma action plan. This plan should include: °· A list of your asthma triggers and how to avoid them. °· A list of symptoms that you experience during an asthma attack. °· Information about when to take medicine and how much medicine to take. °· Information to help you understand your peak flow measurements. °· Contact information for your health care providers. °· Daily actions that you can take to control asthma. °Monitor your asthma °To monitor your asthma: °· Use your peak flow meter every morning and every evening for 2-3 weeks. Record the results in a journal. A drop in your peak flow numbers on one or more days may mean that you are starting to have an asthma attack, even if you are not having symptoms. °· When you have asthma symptoms, write them down in a journal. ° °Avoid asthma triggers °Work with your health care provider to find out what your asthma triggers are. This can be done by: °· Being tested for allergies. °· Keeping a journal that notes when asthma attacks occur and what may have contributed to them. °· Asking your health care provider whether other medical conditions make your asthma worse. °Common asthma triggers include: °· Dust. °· Smoke. This includes campfire smoke and secondhand smoke from tobacco products. °· Pet dander. °· Trees, grasses or  pollens. °· Very cold, dry, or humid air. °· Mold. °· Foods that contain high amounts of sulfites. °· Strong smells. °· Engine exhaust and air pollution. °· Aerosol sprays and fumes from household cleaners. °· Household pests and their droppings, including dust mites and cockroaches. °· Certain medicines, including NSAIDs. °Once you have determined your asthma triggers, take steps to avoid them. Depending on your triggers, you may be able to reduce the chance of an asthma attack by: °· Keeping your home clean. Have someone dust and vacuum your home for you 1 or 2 times a week. If possible, have them use a high-efficiency particulate arrestance (HEPA) vacuum. °· Washing your sheets weekly in hot water. °· Using allergy-proof mattress covers and casings on your bed. °· Keeping pets out of your home. °· Taking care of mold and water problems in your home. °· Avoiding areas where people smoke. °· Avoiding using strong perfumes or odor sprays. °· Avoid spending a lot of time outdoors when pollen counts are high and on very windy days. °· Talking with your health care provider before stopping or starting any new medicines. °Medicines °Take over-the-counter and prescription medicines only as told by your health care provider. Many asthma attacks can be prevented by carefully following your medicine schedule. Taking your medicines correctly is especially important when you cannot avoid certain asthma triggers. Even if you are doing well, do not stop taking your medicine and do not take less medicine. °Act quickly °If an asthma attack happens, acting quickly can decrease how severe it is and   how long it lasts. Take these actions:  Pay attention to your symptoms. If you are coughing, wheezing, or having difficulty breathing, do not wait to see if your symptoms go away on their own. Follow your asthma action plan.  If you have followed your asthma action plan and your symptoms are not improving, call your health care  provider or seek immediate medical care at the nearest hospital. It is important to write down how often you need to use your fast-acting rescue inhaler. You can track how often you use an inhaler in your journal. If you are using your rescue inhaler more often, it may mean that your asthma is not under control. Adjusting your asthma treatment plan may help you to prevent future asthma attacks and help you to gain better control of your condition. How can I prevent an asthma attack when I exercise? Exercise is a common asthma trigger. To prevent asthma attacks during exercise:  Follow advice from your health care provider about whether you should use your fast-acting inhaler before exercising. Many people with asthma experience exercise-induced bronchoconstriction (EIB). This condition often worsens during vigorous exercise in cold, humid, or dry environments. Usually, people with EIB can stay very active by using a fast-acting inhaler before exercising.  Avoid exercising outdoors in very cold or humid weather.  Avoid exercising outdoors when pollen counts are high.  Warm up and cool down when exercising.  Stop exercising right away if asthma symptoms start. Consider taking part in exercises that are less likely to cause asthma symptoms such as:  Indoor swimming.  Biking.  Walking.  Hiking.  Playing football. This information is not intended to replace advice given to you by your health care provider. Make sure you discuss any questions you have with your health care provider. Document Released: 09/28/2009 Document Revised: 06/10/2016 Document Reviewed: 03/26/2016 Elsevier Interactive Patient Education  2019 [employer]   Asthma and Physical Activity Physical activity is an important part of a healthy lifestyle. If you have asthma, it is important to exercise because physical activity can help you to:  Control your asthma.  Maintain your weight or lose weight.  Increase your  energy.  Decrease stress and anxiety.  Lower your risk of getting sick.  Improve your heart health. However, asthma symptoms can flare up when you are physically active or exercising. You can learn how to control your asthma and prevent symptoms during exercise. This will help you to remain physically active. How can asthma affect my ability to be physically active? When you have asthma, physical activity can cause you to have symptoms such as:  Wheezing. This may sound like whistling while breathing.  A feeling of tightness in the chest.  Sore throat.  Coughing.  Shortness of breath.  Tiredness (fatigue) with minimal activity.  Increased sputum production.  Chest pain. What actions can I take to prevent asthma problems during physical activity? Pulmonary rehabilitation Enroll in a pulmonary rehabilitation program. Benefits of this type of program include:  Education on lung diseases.  Classes that teach you how to exercise and be more active while decreasing your shortness of breath.  A group setting that allows you to talk with others who have asthma. Asthma action plan Follow the asthma action plan set by your health care provider. Your personal asthma plan may include:  Taking your medicines as told by your health care provider.  Avoiding your asthma triggers, except physical activity. Triggers may include cold air, dust, pollen, pet dander, and  air pollution.  Tracking your asthma control.  Using a peak flow meter.  Being aware of worsening symptoms.  Knowing when to seek emergency care. Proper breathing During exercise, follow these tips for proper breathing:  Breathe in before starting the exercise and breathe out during the hardest part of the exercise.  Take slow breaths.  Pace yourself and do not try to go too fast.  While breathing out, purse your lips. Before beginning any exercise program or new activity, talk with your health care  provider. Medicines If physical activity triggers your asthma, your health care provider may order the following medicines:  A rescue inhaler (short-acting beta2-agonist) for you to use shortly before physical activity or exercise. Its effects may reduce exercise-related symptoms for 2-3 hours.  A long-acting beta2-agonist that can offer up to 12 hours of relief if taken daily.  Leukotriene modifiers. These pills are taken several hours before physical activity or exercise to help prevent asthma symptoms that are caused by exercise.  Long-term control medicines. These will be given if you have severe or frequent asthma symptoms during or after exercise. These symptoms may also mean that your asthma is not well controlled.  General information  Exercise indoors when the air is dry or during allergy season.  Try to breathe in warm, moist air by wearing a scarf over your nose and mouth or breathing only through your nose.  Spend a few minutes warming up before your workout.  Cool down after exercise. What should I do if my asthma symptoms get worse? Contact your health care provider if your asthma symptoms are getting worse. Your asthma is getting worse if:  You have symptoms more often.  Your symptoms are more severe.  Your symptoms get worse at night and make you lose sleep.  Your peak flow number is lower than your personal best or changes a lot from day to day.  Your asthma medicines do not work as well as they used to.  You use your rescue inhaler more often. If you use your rescue inhaler more than 2 days a week, your asthma is not well controlled.  You go to the emergency room or see your health care provider because of an asthma attack. Where to find support  Ask your health care provider about signing up for a pulmonary rehabilitation program.  Ask your health care provider about asthma support groups.  Visit your local community health department.  Check out local  hospitals' community health programs. Where can I get more information?  Your health care provider.  American Lung Association: lung.org  National Heart, Lung, and Blood Institute: [employer].glnhlbi.nih.gov Contact a health care provider if:  You have trouble walking and talking because you are out of breath. Get help right away if:  Your lips or fingernails are blue.  You are not able to breathe or catch your breath. Summary  Physical activity is an important part of a healthy lifestyle. However, if you have asthma, your symptoms can flare up during exercise or physical activity.  You can prevent problems during physical activity by doing pulmonary rehabilitation, following an asthma action plan, doing proper breathing, and using medicines.  Talk with your health care provider before starting any exercise program or new activity. This information is not intended to replace advice given to you by your health care provider. Make sure you discuss any questions you have with your health care provider. Document Released: 12/26/2017 Document Revised: 12/26/2017 Document Reviewed: 12/26/2017 Elsevier Interactive Patient  Education  2019 [employer].   Asthma, Adult  Asthma is a long-term (chronic) condition in which the airways get tight and narrow. The airways are the breathing passages that lead from the nose and mouth down into the lungs. A person with asthma will have times when symptoms get worse. These are called asthma attacks. They can cause coughing, whistling sounds when you breathe (wheezing), shortness of breath, and chest pain. They can make it hard to breathe. There is no cure for asthma, but medicines and lifestyle changes can help control it. There are many things that can bring on an asthma attack or make asthma symptoms worse (triggers). Common triggers include:  Mold.  Dust.  Cigarette smoke.  Cockroaches.  Things that can cause allergy symptoms (allergens). These include  animal skin flakes (dander) and pollen from trees or grass.  Things that pollute the air. These may include household cleaners, wood smoke, smog, or chemical odors.  Cold air, weather changes, and wind.  Crying or laughing hard.  Stress.  Certain medicines or drugs.  Certain foods such as dried fruit, potato chips, and grape juice.  Infections, such as a cold or the flu.  Certain medical conditions or diseases.  Exercise or tiring activities. Asthma may be treated with medicines and by staying away from the things that cause asthma attacks. Types of medicines may include:  Controller medicines. These help prevent asthma symptoms. They are usually taken every day.  Fast-acting reliever or rescue medicines. These quickly relieve asthma symptoms. They are used as needed and provide short-term relief.  Allergy medicines if your attacks are brought on by allergens.  Medicines to help control the body's defense (immune) system. Follow these instructions at home: Avoiding triggers in your home  Change your heating and air conditioning filter often.  Limit your use of fireplaces and wood stoves.  Get rid of pests (such as roaches and mice) and their droppings.  Throw away plants if you see mold on them.  Clean your floors. Dust regularly. Use cleaning products that do not smell.  Have someone vacuum when you are not home. Use a vacuum cleaner with a HEPA filter if possible.  Replace carpet with wood, tile, or vinyl flooring. Carpet can trap animal skin flakes and dust.  Use allergy-proof pillows, mattress covers, and box spring covers.  Wash bed sheets and blankets every week in hot water. Dry them in a dryer.  Keep your bedroom free of any triggers.  Avoid pets and keep windows closed when things that cause allergy symptoms are in the air.  Use blankets that are made of polyester or cotton.  Clean bathrooms and kitchens with bleach. If possible, have someone repaint  the walls in these rooms with mold-resistant paint. Keep out of the rooms that are being cleaned and painted.  Wash your hands often with soap and water. If soap and water are not available, use hand sanitizer.  Do not allow anyone to smoke in your home. General instructions  Take over-the-counter and prescription medicines only as told by your doctor. ? Talk with your doctor if you have questions about how or when to take your medicines. ? Make note if you need to use your medicines more often than usual.  Do not use any products that contain nicotine or tobacco, such as cigarettes and e-cigarettes. If you need help quitting, ask your doctor.  Stay away from secondhand smoke.  Avoid doing things outdoors when allergen counts are high and when air  quality is low.  Wear a ski mask when doing outdoor activities in the winter. The mask should cover your nose and mouth. Exercise indoors on cold days if you can.  Warm up before you exercise. Take time to cool down after exercise.  Use a peak flow meter as told by your doctor. A peak flow meter is a tool that measures how well the lungs are working.  Keep track of the peak flow meter's readings. Write them down.  Follow your asthma action plan. This is a written plan for taking care of your asthma and treating your attacks.  Make sure you get all the shots (vaccines) that your doctor recommends. Ask your doctor about a flu shot and a pneumonia shot.  Keep all follow-up visits as told by your doctor. This is important. Contact a doctor if:  You have wheezing, shortness of breath, or a cough even while taking medicine to prevent attacks.  The mucus you cough up (sputum) is thicker than usual.  The mucus you cough up changes from clear or white to yellow, green, gray, or bloody.  You have problems from the medicine you are taking, such as: ? A rash. ? Itching. ? Swelling. ? Trouble breathing.  You need reliever medicines more than  2-3 times a week.  Your peak flow reading is still at 50-79% of your personal best after following the action plan for 1 hour.  You have a fever. Get help right away if:  You seem to be worse and are not responding to medicine during an asthma attack.  You are short of breath even at rest.  You get short of breath when doing very little activity.  You have trouble eating, drinking, or talking.  You have chest pain or tightness.  You have a fast heartbeat.  Your lips or fingernails start to turn blue.  You are light-headed or dizzy, or you faint.  Your peak flow is less than 50% of your personal best.  You feel too tired to breathe normally. Summary  Asthma is a long-term (chronic) condition in which the airways get tight and narrow. An asthma attack can make it hard to breathe.  Asthma cannot be cured, but medicines and lifestyle changes can help control it.  Make sure you understand how to avoid triggers and how and when to use your medicines. This information is not intended to replace advice given to you by your health care provider. Make sure you discuss any questions you have with your health care provider. Document Released: 03/28/2008 Document Revised: 11/14/2016 Document Reviewed: 11/14/2016 Elsevier Interactive Patient Education  2019 [employer] Inc.   Asthma Action Plan, Adult Introduction An asthma action plan helps you understand how to manage your asthma and what to do when you have an asthma attack. The action plan is a color-coded plan that lists the symptoms that indicate whether or not your condition is under control and what actions to take.  If you have symptoms in the green zone, it means you are doing well.  If you have symptoms in the yellow zone, it means you are having problems.  If you have symptoms in the red zone, you need medical care right away. Follow the plan that you and your health care provider develop. Review your plan with your health  care provider at each visit. What triggers your asthma? Knowing the things that can trigger an asthma attack or make your asthma symptoms worse is very important. Talk to your health  care provider about your asthma triggers and how to avoid them. Record your known asthma triggers here: _______________ What is your personal best peak flow reading? If you use a peak flow meter, determine your personal best reading. Record it here: _______________ Red zone Symptoms in this zone mean that you should get medical help right away. You will likely feel distressed and have symptoms at rest that restrict your activity. You are in the red zone if:  You are breathing hard and quickly.  Your nose opens wide, your ribs show, and your neck muscles become visible when you breathe in.  Your lips, fingers, or toes are a bluish color.  You have trouble speaking in full sentences.  Your peak flow reading is less than __________ (less than 50% of your personal best).  Your symptoms do not improve within 15-20 minutes after you use your reliever or rescue medicine (bronchodilator). If you have any of these symptoms:  Call your local emergency services (911 in the [place] or go to the nearest emergency room.  Use your reliever or rescue medicine. ? Start a nebulizer treatment or take 2-4 puffs from a metered-dose inhaler with a spacer. ? Repeat this action every 15-20 minutes until help arrives. Yellow zone Symptoms in this zone mean that your condition may be getting worse. You may have symptoms that interfere with exercise, are noticeably worse after exposure to triggers, or are worse at the first sign of a cold (upper respiratory infection). These may include:  Waking from sleep.  Coughing, especially at night or first thing in the morning.  Mild wheezing.  Chest tightness.  A peak flow reading that is __________ to __________ (50-79% of your personal best). If you have any of these symptoms:  Add  the following medicine to the ones that you use daily: ? Reliever or rescue medicine and dosage: _______________ ? Additional medicine and dosage: _______________ Call your health care provider if:  You remain in the yellow zone for __________ hours.  You are using a reliever or rescue medicine more than 2-3 times a week. Green zone This zone means that your asthma is under control. You may not have any symptoms while you are in the green zone. This means that you:  Have no coughing or wheezing, even while you are working or playing.  Sleep through the night.  Are breathing well.  Have a peak flow reading that is above __________ (80% of your personal best or greater). If you are in the green zone, continue to manage your asthma as directed:  Take these medicines every day: ? Controller medicine and dosage: _______________ ? Controller medicine and dosage: _______________ ? Controller medicine and dosage: _______________ ? Controller medicine and dosage: _______________  Before exercise, use this reliever or rescue medicine: _______________ Call your health care provider if you are using a reliever or rescue medicine more than 2-3 times a week. Where to find more information You can find more information about asthma from:  Centers for Disease Control and Prevention: SendThoughts.com.pt  American Lung Association: www.lung.org This information is not intended to replace advice given to you by your health care provider. Make sure you discuss any questions you have with your health care provider. Document Released: 08/07/2009 Document Revised: 06/21/2017 Document Reviewed: 06/21/2017 Elsevier Interactive Patient Education  2019 [employer].   Shortness of Breath, Adult Shortness of breath means you have trouble breathing. Shortness of breath could be a sign of a medical problem. Follow these instructions at  home:   Watch for any changes in your symptoms.  Do not use any  products that contain nicotine or tobacco, such as cigarettes, e-cigarettes, and chewing tobacco.  Do not smoke. Smoking can cause shortness of breath. If you need help to quit smoking, ask your doctor.  Avoid things that can make it harder to breathe, such as: ? Mold. ? Dust. ? Air pollution. ? Chemical smells. ? Things that can cause allergy symptoms (allergens), if you have allergies.  Keep your living space clean. Use products that help remove mold and dust.  Rest as needed. Slowly return to your normal activities.  Take over-the-counter and prescription medicines only as told by your doctor. This includes oxygen therapy and inhaled medicines.  Keep all follow-up visits as told by your doctor. This is important. Contact a doctor if:  Your condition does not get better as soon as expected.  You have a hard time doing your normal activities, even after you rest.  You have new symptoms. Get help right away if:  Your shortness of breath gets worse.  You have trouble breathing when you are resting.  You feel light-headed or you pass out (faint).  You have a cough that is not helped by medicines.  You cough up blood.  You have pain with breathing.  You have pain in your chest, arms, shoulders, or belly (abdomen).  You have a fever.  You cannot walk up stairs.  You cannot exercise the way you normally do. These symptoms may represent a serious problem that is an emergency. Do not wait to see if the symptoms will go away. Get medical help right away. Call your local emergency services (911 in the [place] Do not drive yourself to the hospital. Summary  Shortness of breath is when you have trouble breathing enough air. It can be a sign of a medical problem.  Avoid things that make it hard for you to breathe, such as smoking, pollution, mold, and dust.  Watch for any changes in your symptoms. Contact your doctor if you do not get better or you get worse. This information  is not intended to replace advice given to you by your health care provider. Make sure you discuss any questions you have with your health care provider. Document Released: 03/28/2008 Document Revised: 03/12/2018 Document Reviewed: 03/12/2018 Elsevier Interactive Patient Education  2019 [employer].

## 2018-12-27 NOTE — Care Management Note (Signed)
Case Management Note  Patient Details  Name: Kathleen Rodriguez MRN: [id number] Date of Birth: May 17, 1966  Subjective/Objective: Patient Asthma Exac.Ordered for neb machine-[facility] rep Jermaine notified to deliver to rm prior d/c.                    Action/Plan:d/c home w/dme   Expected Discharge Date:  12/27/18               Expected Discharge Plan:  Home/Self Care  In-House Referral:     Discharge planning Services  CM Consult  Post Acute Care Choice:    Choice offered to:  Patient  DME Arranged:  Nebulizer machine DME Agency:     HH Arranged:    HH Agency:     Status of Service:  Completed, signed off  If discussed at [employer] of Stay Meetings, dates discussed:    Additional Comments:  Lanier Clam, RN 12/27/2018, 10:44 AM

## 2018-12-28 ENCOUNTER — Telehealth: Payer: Self-pay | Admitting: Pulmonary Disease

## 2018-12-28 DIAGNOSIS — J45909 Unspecified asthma, uncomplicated: Secondary | ICD-10-CM | POA: Diagnosis not present

## 2018-12-28 NOTE — Telephone Encounter (Signed)
Spoke with pt, she states she received the medicine and we dont need to do anything. She has an appt with Tonya and she can address CPAP at her OV. Nothing further is needed.

## 2019-01-01 ENCOUNTER — Ambulatory Visit (INDEPENDENT_AMBULATORY_CARE_PROVIDER_SITE_OTHER): Payer: Medicaid Other | Admitting: Nurse Practitioner

## 2019-01-01 ENCOUNTER — Encounter: Payer: Self-pay | Admitting: Nurse Practitioner

## 2019-01-01 ENCOUNTER — Ambulatory Visit (INDEPENDENT_AMBULATORY_CARE_PROVIDER_SITE_OTHER)
Admission: RE | Admit: 2019-01-01 | Discharge: 2019-01-01 | Disposition: A | Discharge status: Home / Self Care | Payer: Medicaid Other | Source: Ambulatory Visit | Attending: Nurse Practitioner | Admitting: Nurse Practitioner

## 2019-01-01 VITALS — BP 136/80 | HR 105 | Ht 65.0 in | Wt 254.4 lb

## 2019-01-01 DIAGNOSIS — J4551 Severe persistent asthma with (acute) exacerbation: Secondary | ICD-10-CM | POA: Diagnosis not present

## 2019-01-01 DIAGNOSIS — R0602 Shortness of breath: Secondary | ICD-10-CM | POA: Diagnosis not present

## 2019-01-01 MED ORDER — AZITHROMYCIN 250 MG PO TABS: ORAL_TABLET | ORAL | 0 refills | Status: DC

## 2019-01-01 MED ORDER — PREDNISONE 10 MG PO TABS: ORAL_TABLET | ORAL | 0 refills | Status: DC

## 2019-01-01 MED ORDER — METHYLPREDNISOLONE ACETATE 80 MG/ML IJ SUSP
80.0000 mg | Freq: Once | INTRAMUSCULAR | Status: AC
  Administered 2019-01-01: 80 mg via INTRAMUSCULAR

## 2019-01-01 MED ORDER — METHYLPREDNISOLONE ACETATE 80 MG/ML IJ SUSP: 80.0000 mg | Freq: Once | INTRAMUSCULAR | Status: DC

## 2019-01-01 NOTE — Progress Notes (Signed)
@Patient  ID: Kathleen Rodriguez, female    DOB: 10-25-1965, 53 y.o.   MRN: [id number]  Chief Complaint  Patient presents with  . Hospitalization Follow-up    Shortness of breath. Nebulizer treatment is not working.    Referring provider: [name], MD  HPI  53 y.o. female former smoker with cough from upper airway cough syndrome/ severe persistent asthma with eosinophilic phenotype and post nasal drip.  No bronchiectasis on CT chest. She also has a history of obstructive sleep apnea.  Patient has a history of cocaine use.  Is followed by Dr. Halford Chessman.   Tests:  Pulmonary tests: CT chest4/15/14 >>no PE, no acute findings Labs 02/11/13 >> ANA negative, Anti-GBM Ab < 1, ANCA negative PFT 05/15/13>>FEV1 1.90 (76%), FEV1% 72, TLC 4.23 (81%), DLCO 89%, no BD PFT 03/03/17 >> FEV1 1.57 (64%), FEV1% 64, TLC 4.47 (84%), DLCO 685, + BD RAST 07/28/17 >> mild reaction to trees, ragweed; IgE 446 HRCT chest 03/12/18 >> mild BTX  Sleep tests: PSG 04/19/13 >> AHI 16.4, SpO2 low 84%.  Cardiac tests: Echo9/16/18 >> EF 60 to 65%  OV 01/02/19 - Hospital Follow up - 12/24/18 - 3/520 Patient presents for hospital follow-up.  She was admitted to the hospital on 12/24/2018 with acute asthma exacerbation with bronchitis complicated by acute hypoxic respiratory failure.  She was treated with IV Solu-Medrol and Augmentin.  Was discharged home with 10 mg of prednisone daily for 5 days.  She states that she has not recovered.  She is still wheezing and having some shortness of breath.  Completed her medications as directed.  She is not using the correct inhaler.  She brought a Dulera inhaler with her today which was not prescribed.  She should be using Pulmicort.  She denies any recent fever.  Her O2 sats have been stable.  Her oxygen level is 97% on room air today.     Allergies  Allergen Reactions  . Tomato Hives, Itching and Other (See Comments)    ALSO REACTS TO KETCHUP  . Latex Itching and Rash  . Wool  Alcohol [Lanolin] Itching    Immunization History  Administered Date(s) Administered  . Influenza,inj,Quad PF,6+ Mos 08/13/2013, 07/27/2015, 07/21/2016, 10/22/2017, 08/01/2018  . Tdap 08/14/2013    Past Medical History:  Diagnosis Date  . Arthritis    "knees, lower back; legs, ankles" (01/27/2016)  . Asthma    followed by Dr. Halford Chessman  . CHF (congestive heart failure) ([employer]) 2016   "when I went into a coma"  . Cocaine abuse ([place])   . 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 Dyspnea   . GERD (gastroesophageal reflux disease)   . Hypertension    "doctor took me off RX in 2016" (01/27/2016)  . Influenza B April 4098   Complicated by multi-organ failure  . OSA on CPAP "since " 03/20/2013  . Pneumonia 2016  . Required emergent intubation    asthma exacerbation in 2016  . Tobacco abuse   . Upper airway cough syndrome     Tobacco History: Social History   Tobacco Use  Smoking Status Former Smoker  . Packs/day: 0.25  . Years: 20.00  . Pack years: 5.00  . Types: Cigarettes  . Last attempt to quit: 01/31/2013  . Years since quitting: 5.9  Smokeless Tobacco Never Used   Counseling given: Yes   Outpatient Encounter Medications as of 01/01/2019  Medication Sig  . acetaminophen (TYLENOL) 500 MG tablet Take 500 mg by mouth every 6 (six) hours  as needed for mild pain.  Marland Kitchen albuterol (PROVENTIL HFA;VENTOLIN HFA) 108 (90 Base) MCG/ACT inhaler Inhale 2 puffs into the lungs every 6 (six) hours as needed for wheezing or shortness of breath.  Marland Kitchen aspirin EC 81 MG EC tablet Take 1 tablet (81 mg total) by mouth daily.  . benzonatate (TESSALON) 200 MG capsule Take 1 capsule (200 mg total) by mouth 2 (two) times daily as needed for cough.  . budesonide (PULMICORT) 0.25 MG/2ML nebulizer solution Take 2 mLs (0.25 mg total) by nebulization 2 (two) times daily. (Patient taking differently: Take 0.25 mg by nebulization 3 (three) times daily as needed (wheezing and sob). )  . Camphor-Eucalyptus-Menthol (VICKS  VAPORUB EX) Apply 1 application topically daily as needed (congestion).  . cloNIDine (CATAPRES) 0.1 MG tablet Take 1 tablet (0.1 mg total) by mouth 2 (two) times daily.  . fluconazole (DIFLUCAN) 150 MG tablet Take 1 tablet (150 mg total) by mouth daily.  . fluticasone (FLONASE) 50 MCG/ACT nasal spray Place 1 spray into both nostrils daily. (Patient taking differently: Place 1 spray into both nostrils daily as needed for allergies. )  . gabapentin (NEURONTIN) 300 MG capsule Take 1 capsule (300 mg total) by mouth 2 (two) times daily.  Marland Kitchen ipratropium (ATROVENT) 0.02 % nebulizer solution Take 2.5 mLs (0.5 mg total) by nebulization 2 (two) times daily as needed for wheezing or shortness of breath.  . levalbuterol (XOPENEX) 1.25 MG/0.5ML nebulizer solution Take 1.25 mg by nebulization 2 (two) times daily as needed for wheezing or shortness of breath.  . Menthol (HALLS COUGH DROPS MT) Use as directed 1 drop in the mouth or throat 2 (two) times daily as needed (sore throat and cough).  . methocarbamol (ROBAXIN) 500 MG tablet Take 1 tablet (500 mg total) by mouth every 8 (eight) hours as needed for muscle spasms.  . pantoprazole (PROTONIX) 40 MG tablet Take 1 tablet (40 mg total) by mouth daily at 12 noon.  . [EXPIRED] predniSONE (DELTASONE) 10 MG tablet Take 1 tablet (10 mg total) by mouth daily with breakfast for 5 days.  Marland Kitchen Spacer/Aero-Holding Chambers (AEROCHAMBER MV) inhaler Use as instructed  . azithromycin (ZITHROMAX) 250 MG tablet Take 2 tablets (500 mg) on day 1, then take 1 tablet (250 mg) on days 2-5  . predniSONE (DELTASONE) 10 MG tablet Take 4 tabs for 2 days, then 3 tabs for 2 days, then 2 tabs for 2 days, then 1 tab for 2 days, then stop  . [EXPIRED] methylPREDNISolone acetate (DEPO-MEDROL) injection 80 mg   . [DISCONTINUED] methylPREDNISolone acetate (DEPO-MEDROL) injection 80 mg    No facility-administered encounter medications on file as of 01/01/2019.      Review of Systems  Review of  Systems  Constitutional: Negative.  Negative for chills and fever.  HENT: Negative.   Respiratory: Positive for cough, shortness of breath and wheezing.   Cardiovascular: Negative.  Negative for chest pain, palpitations and leg swelling.  Gastrointestinal: Negative.   Allergic/Immunologic: Negative.   Neurological: Negative.   Psychiatric/Behavioral: Negative.        Physical Exam  BP 136/80 (BP Location: Right Arm, Patient Position: Sitting, Cuff Size: Large)   Pulse (!) 105   Ht 5\' 5"  (1.651 m)   Wt 254 lb 6.4 oz (115.4 kg)   SpO2 97%   BMI 42.33 kg/m   Wt Readings from Last 5 Encounters:  01/01/19 254 lb 6.4 oz (115.4 kg)  12/25/18 253 lb 15.5 oz (115.2 kg)  11/07/18 252 lb 12.8 oz (  114.7 kg)  09/05/18 259 lb 9.6 oz (117.8 kg)  08/29/18 251 lb (113.9 kg)     Physical Exam Vitals signs and nursing note reviewed.  Constitutional:      General: She is not in acute distress.    Appearance: She is well-developed.  Cardiovascular:     Rate and Rhythm: Normal rate and regular rhythm.  Pulmonary:     Effort: Pulmonary effort is normal. No respiratory distress.     Breath sounds: Wheezing present. No rhonchi.  Musculoskeletal:        General: No swelling.  Neurological:     Mental Status: She is alert and oriented to person, place, and time.       Imaging: Dg Chest 2 View  Result Date: 01/01/2019 CLINICAL DATA:  Shortness of breath EXAM: CHEST - 2 VIEW COMPARISON:  December 24, 2018 FINDINGS: There is no edema or consolidation. The heart size and pulmonary vascularity are normal. No adenopathy. No bone lesions. IMPRESSION: No edema or consolidation. Electronically Signed   By: Lowella Grip III M.D.   On: 01/01/2019 11:24   Dg Chest Port 1 View  Result Date: 12/24/2018 CLINICAL DATA:  Shortness of breath for 2 days. No relief with nebulizer treatments. EXAM: PORTABLE CHEST 1 VIEW COMPARISON:  Two-view chest x-ray 07/30/2018 FINDINGS: The cardiac silhouette has  increased in size since the prior study. There is some straightening of the left border. No edema or effusion is present. No focal airspace disease is present the visualized soft tissues and bony thorax are unremarkable. IMPRESSION: 1. Slight increase in size and fullness of the cardiac silhouette. Pericardial effusion is not excluded. Consider echocardiogram for further evaluation. 2. No edema or effusion to suggest failure. No focal airspace disease. Electronically Signed   By: San Morelle M.D.   On: 12/24/2018 19:25     Assessment & Plan:   Asthma exacerbation Asthma flare has not resolved.  Patient is still short of breath and wheezing.  Patient refuses nebulizer treatment in office today.  We discussed her medications and showed her the correct medication she should be taking.  Patient Instructions  Severe persistent asthma   -continue pulmicort, singulair, and prn albuterol - DepoMedrol given in office today - Will order prednisone taper - Will order azithromycin  - May take mucinex twice daily - Will order chest x ray and call with results  Upper airway cough syndrome with post nasal drip. - flonase, singulair, OTC antihistamine  Follow up: Follow up with Dr. Halford Chessman at his first available appointment in around 2 weeks or sooner if needed       Fenton Foy, NP 01/02/2019

## 2019-01-01 NOTE — Patient Instructions (Addendum)
Severe persistent asthma  -continue pulmicort, singulair, and prn albuterol - DepoMedrol given in office today - Will order prednisone taper - Will order azithromycin  - May take mucinex twice daily - Will order chest x ray and call with results  Upper airway cough syndrome with post nasal drip. - flonase, singulair, OTC antihistamine  Follow up: Follow up with Dr. Craige Cotta at his first available appointment in around 2 weeks or sooner if needed

## 2019-01-02 ENCOUNTER — Encounter: Payer: Self-pay | Admitting: Nurse Practitioner

## 2019-01-02 NOTE — Assessment & Plan Note (Signed)
Asthma flare has not resolved.  Patient is still short of breath and wheezing.  Patient refuses nebulizer treatment in office today.  We discussed her medications and showed her the correct medication she should be taking.  Patient Instructions  Severe persistent asthma   -continue pulmicort, singulair, and prn albuterol - DepoMedrol given in office today - Will order prednisone taper - Will order azithromycin  - May take mucinex twice daily - Will order chest x ray and call with results  Upper airway cough syndrome with post nasal drip. - flonase, singulair, OTC antihistamine  Follow up: Follow up with Dr. Craige Cotta at his first available appointment in around 2 weeks or sooner if needed

## 2019-01-11 ENCOUNTER — Telehealth: Payer: Self-pay | Admitting: Pulmonary Disease

## 2019-01-11 DIAGNOSIS — G4733 Obstructive sleep apnea (adult) (pediatric): Secondary | ICD-10-CM

## 2019-01-11 MED ORDER — ALBUTEROL SULFATE HFA 108 (90 BASE) MCG/ACT IN AERS: 2.0000 | INHALATION_SPRAY | Freq: Four times a day (QID) | RESPIRATORY_TRACT | 6 refills | Status: DC | PRN

## 2019-01-11 NOTE — Telephone Encounter (Signed)
Called and spoke with patient, albuterol has been sent in, patient stated that while in the hospital she was given [facility] and wanted to know if we can send in a refill. She also wanted to know if we can fill out a handicap placard. Please advise, thank you.

## 2019-01-13 NOTE — Telephone Encounter (Signed)
Please verify what she is using at home.  Her medication list includes pulmicort, atrovent, and xopenex all by nebulizer.  If she is using these, then she needs to do the following:    1) pulmicort one vial nebulized in the morning and one vial nebulized at night.  She needs to rinse her mouth after each use.  She needs to use this on regular basis, and not as needed as is listed in medication section. 2) atrovent and xopenex one vial nebulized as needed every 6 hours for cough, wheeze, chest congestion or shortness of breath.  If she is using the above, then she does not need dulera.  If she is not using pulmicort, then please send refill for dulera 200 two puffs in the morning and two puffs at night.  She needs to rinse mouth after each use.

## 2019-01-14 NOTE — Telephone Encounter (Signed)
Spoke to pt and relayed Dr Evlyn Courier recommendations.  Nothing further is needed.

## 2019-01-14 NOTE — Telephone Encounter (Signed)
Called that patient and advised her of Dr. Evlyn Courier response regarding nebulizer treatment. Patient confirmed she is taking Pulmicort, Atrovent and Xopenex.  Advised her that based on this she was not to use Dulera. And no prescription for that medication would be sent per Dr. Craige Cotta.  Patient asked if Dr. Craige Cotta being willing to sign form for handicap placard. She also asked for a new CPAP machine and supplies. Patient stated the one she has was very old and needed to be replaced. Patient confirmed she uses a full face mask and the current machine was from [facility].  Message routed to Dr. Craige Cotta to review.   Dr. Craige Cotta: Can order be placed for CPAP machine? And can she get handicap form signed?

## 2019-01-14 NOTE — Telephone Encounter (Signed)
Okay to send order for new auto CPAP range 5 to 15 cm H2O with heated humidity and new CPAP supplies.  I can complete handicap form when I am in office later this week, and then this can be mailed to her.

## 2019-01-15 ENCOUNTER — Telehealth: Payer: Self-pay | Admitting: Pulmonary Disease

## 2019-01-15 NOTE — Telephone Encounter (Signed)
Noted  

## 2019-01-15 NOTE — Telephone Encounter (Signed)
Called and spoke with Boneta Lucks, she stated that the patient is having trouble with her machine not working. Boneta Lucks has placed an order for RT to go out and look at the machine and give her a replacement temporarily if needed. Boneta Lucks also stated that the patient will be eligible for a new CPAP on July 3rd. Patient will need a new OV and orders done at that time.    Will route over to VS as an FYI.

## 2019-01-15 NOTE — Progress Notes (Deleted)
Patient ID: Kathleen Rodriguez, female   DOB: January 29, 1966, 53 y.o.   MRN: [id number]  After hospitalization 3/2-12/27/2018 for asthma exacerbation.

## 2019-01-16 ENCOUNTER — Ambulatory Visit: Payer: Medicaid Other

## 2019-01-23 DIAGNOSIS — R269 Unspecified abnormalities of gait and mobility: Secondary | ICD-10-CM | POA: Diagnosis not present

## 2019-01-23 DIAGNOSIS — L03119 Cellulitis of unspecified part of limb: Secondary | ICD-10-CM | POA: Diagnosis not present

## 2019-01-23 DIAGNOSIS — J4551 Severe persistent asthma with (acute) exacerbation: Secondary | ICD-10-CM | POA: Diagnosis not present

## 2019-01-23 DIAGNOSIS — J9690 Respiratory failure, unspecified, unspecified whether with hypoxia or hypercapnia: Secondary | ICD-10-CM | POA: Diagnosis not present

## 2019-01-28 ENCOUNTER — Other Ambulatory Visit: Payer: Self-pay

## 2019-01-28 ENCOUNTER — Ambulatory Visit: Payer: Medicaid Other | Admitting: Pulmonary Disease

## 2019-01-28 ENCOUNTER — Ambulatory Visit (INDEPENDENT_AMBULATORY_CARE_PROVIDER_SITE_OTHER): Payer: Medicaid Other | Admitting: Adult Health

## 2019-01-28 ENCOUNTER — Encounter: Payer: Self-pay | Admitting: Adult Health

## 2019-01-28 DIAGNOSIS — J301 Allergic rhinitis due to pollen: Secondary | ICD-10-CM | POA: Diagnosis not present

## 2019-01-28 DIAGNOSIS — G4733 Obstructive sleep apnea (adult) (pediatric): Secondary | ICD-10-CM | POA: Diagnosis not present

## 2019-01-28 DIAGNOSIS — J4540 Moderate persistent asthma, uncomplicated: Secondary | ICD-10-CM

## 2019-01-28 MED ORDER — BUDESONIDE 0.25 MG/2ML IN SUSP: 0.2500 mg | Freq: Two times a day (BID) | RESPIRATORY_TRACT | 1 refills | Status: DC

## 2019-01-28 NOTE — Progress Notes (Signed)
Virtual Visit via Telephone Note  I connected with [name] on 01/28/19 at 10:30 AM EDT by telephone and verified that I am speaking with the correct person using two identifiers.   I discussed the limitations, risks, security and privacy concerns of performing an evaluation and management service by telephone and the availability of in person appointments. I also discussed with the patient that there may be a patient responsible charge related to this service. The patient expressed understanding and agreed to proceed.   History of Present Illness: Today's tele-visit is for 1 month follow-up of asthma. Patient is a 53 year old female former smoker followed for severe persistent asthma with an eosinophilic phenotype Medical history significant for obstructive sleep apnea on CPAP and polysubstance abuse with cocaine Previous allergic reaction to Xolair .  She was seen 1 month ago with Asthmatic bronchitis flare , treated with Zpack and Prednisone taper. Says she got better. Does notice more symptoms coming back with high pollen outside. Chest xray last month was clear.  Complains that for last 1-2 days of dry cough, wheezing and tightness. No fever or travel.  No discolored mucus . Some nasal drainage and stuffiness. Feel pollen is making worse. Has been outside and had windows open.  She says she is not taking budesonide, says she could not get at pharmacy because it was not $3. We call the pharmacy and they confirmed it is on the $3 list . She says she will got get this .  She has albuterol inhaler and nebs at home, not using much .   She remains on CPAP At bedtime. Says she wears each night , feels rested.  CPAP download was requested.    We discussed COVID precautions .    Observations/Objective: CT chest4/15/14 >>no PE, no acute findings Labs 02/11/13 >> ANA negative, Anti-GBM Ab < 1, ANCA negative PFT 05/15/13>>FEV1 1.90 (76%), FEV1% 72, TLC 4.23 (81%), DLCO 89%, no BD PFT  03/03/17 >> FEV1 1.57 (64%), FEV1% 64, TLC 4.47 (84%), DLCO 685, + BD RAST 07/28/17 >> mild reaction to trees, ragweed; IgE 446 HRCT chest 03/12/18 >> mild BTX  Sleep tests: PSG 04/19/13 >> AHI 16.4, SpO2 low 84%.  Echo9/16/18 >> EF 60 to 65%  Assessment and Plan: Asthma -increased symptoms with Allergic Rhinitis flare and off ICS maintenance -restart pulmicort and control for triggers .   Allergic Rhinitis flare -add zyrtec and flonase   OSA -stable on CPAP . Download requested.   Plan  Patient Instructions  Restart Pulmicort Neb Twice daily  , rinse after use.  Saline nasal rinses As needed   Begin Flonase nasal 2 puffs daily  Begin Zyrtec or Claritin 10 mg at bedtime. Mucinex DM twice daily as needed for cough and congestion. Use albuterol nebulizer as rescue only. For wheezing and shortness of breath. Continue on CPAP at bedtime. Try to wear each night .  Goal is to wear at least 6 hours each night Followup with Dr. Halford Chessman in 2-3  months and As needed  .  Please contact office for sooner follow up if symptoms do not improve or worsen or seek emergency care       Follow Up Instructions: Follow up with Dr. Halford Chessman  In 3 months and As needed    I discussed the assessment and treatment plan with the patient. The patient was provided an opportunity to ask questions and all were answered. The patient agreed with the plan and demonstrated an understanding of the instructions.  The patient was advised to call back or seek an in-person evaluation if the symptoms worsen or if the condition fails to improve as anticipated.  Patient present for visit at home and myself present at office   I provided 21 minutes of non-face-to-face time during this encounter.   [name], NP

## 2019-01-28 NOTE — Patient Instructions (Addendum)
Restart Pulmicort Neb Twice daily  , rinse after use.  Saline nasal rinses As needed   Begin Flonase nasal 2 puffs daily  Begin Zyrtec or Claritin 10 mg at bedtime. Mucinex DM twice daily as needed for cough and congestion. Use albuterol nebulizer as rescue only. For wheezing and shortness of breath. Continue on CPAP at bedtime. Try to wear each night .  Goal is to wear at least 6 hours each night Followup with Dr. Craige Cotta in 2-3  months and As needed  .  Please contact office for sooner follow up if symptoms do not improve or worsen or seek emergency care

## 2019-02-05 ENCOUNTER — Telehealth: Payer: Self-pay | Admitting: Family Medicine

## 2019-02-05 MED ORDER — FLUCONAZOLE 150 MG PO TABS: 150.0000 mg | ORAL_TABLET | Freq: Once | ORAL | 0 refills | Status: AC

## 2019-02-05 NOTE — Telephone Encounter (Signed)
Patients call returned.  Patient identified by name and date of birth.  Patient states she had sex with her husband recently and she then developed consistent itching with slight white discharge. Patient does endorse urinary frequency but states she is drink a large amount of water.  Patient denies painful urination or burning upon urination.  Patient advised that she would need to come in for either a lab visit or an office visit.  Triage would contact Dr. Alvis Lemmings to determine the next course of action and she would receive a call back to  Inform her of the decision.  Patient acknowledged understanding of the advice.

## 2019-02-05 NOTE — Telephone Encounter (Signed)
°  Patient states she is feeling vaginal itching and is concerned on what it could be due to her having a sexual encounter  and would like to know what she should do please follow up.

## 2019-02-05 NOTE — Addendum Note (Signed)
Addended by: Jonah Blue B on: 02/05/2019 05:54 PM   Modules accepted: Orders

## 2019-02-06 NOTE — Telephone Encounter (Signed)
Patients call returned.  Patient identified by name and date of birth.  Patient advised medication was sent to her pharmacy  Patient acknowledged understanding of information.

## 2019-02-07 ENCOUNTER — Ambulatory Visit: Payer: Medicaid Other | Attending: Family Medicine | Admitting: Family Medicine

## 2019-02-07 ENCOUNTER — Other Ambulatory Visit: Payer: Self-pay

## 2019-02-07 ENCOUNTER — Encounter: Payer: Self-pay | Admitting: Family Medicine

## 2019-02-07 DIAGNOSIS — R21 Rash and other nonspecific skin eruption: Secondary | ICD-10-CM | POA: Diagnosis not present

## 2019-02-07 DIAGNOSIS — I10 Essential (primary) hypertension: Secondary | ICD-10-CM | POA: Diagnosis not present

## 2019-02-07 DIAGNOSIS — N898 Other specified noninflammatory disorders of vagina: Secondary | ICD-10-CM

## 2019-02-07 DIAGNOSIS — G629 Polyneuropathy, unspecified: Secondary | ICD-10-CM | POA: Diagnosis not present

## 2019-02-07 DIAGNOSIS — R232 Flushing: Secondary | ICD-10-CM | POA: Diagnosis not present

## 2019-02-07 DIAGNOSIS — K0889 Other specified disorders of teeth and supporting structures: Secondary | ICD-10-CM

## 2019-02-07 MED ORDER — AMOXICILLIN 500 MG PO CAPS: 500.0000 mg | ORAL_CAPSULE | Freq: Three times a day (TID) | ORAL | 0 refills | Status: DC

## 2019-02-07 MED ORDER — PANTOPRAZOLE SODIUM 40 MG PO TBEC: 40.0000 mg | DELAYED_RELEASE_TABLET | Freq: Every day | ORAL | 3 refills | Status: DC

## 2019-02-07 MED ORDER — UREA 20 % EX CREA: TOPICAL_CREAM | CUTANEOUS | 1 refills | Status: DC | PRN

## 2019-02-07 MED ORDER — FLUCONAZOLE 150 MG PO TABS: 150.0000 mg | ORAL_TABLET | Freq: Once | ORAL | 0 refills | Status: AC

## 2019-02-07 MED ORDER — GABAPENTIN 300 MG PO CAPS: 300.0000 mg | ORAL_CAPSULE | Freq: Two times a day (BID) | ORAL | 2 refills | Status: DC

## 2019-02-07 MED ORDER — CLONIDINE HCL 0.1 MG PO TABS: 0.1000 mg | ORAL_TABLET | Freq: Two times a day (BID) | ORAL | 3 refills | Status: DC

## 2019-02-07 MED ORDER — ASPIRIN 81 MG PO TBEC: 81.0000 mg | DELAYED_RELEASE_TABLET | Freq: Every day | ORAL | 3 refills | Status: DC

## 2019-02-07 MED ORDER — MISC. DEVICES MISC: 0 refills | Status: DC

## 2019-02-07 NOTE — Progress Notes (Signed)
Patient has been called and DOB has been verified. Patient has been screened and transferred to PCP to start phone visit.  C/C: dental pain,asthma  Refills: Asprin,clonidine, gabapentin, Protonix.

## 2019-02-07 NOTE — Progress Notes (Signed)
Virtual Visit via Telephone Note  I connected with Kathleen Rodriguez, on 02/07/2019 at 9:30am by telephone and verified that I am speaking with the correct person using two identifiers.   Consent: I discussed the limitations, risks, security and privacy concerns of performing an evaluation and management service by telephone and the availability of in person appointments. I also discussed with the patient that there may be a patient responsible charge related to this service. The patient expressed understanding and agreed to proceed.   Location of Patient: Patient's  home  Location of Provider: Clinic  Persons participating in Telemedicine visit: Jalon Isherwood - CMA Dr Alvis Lemmings - Physician    History of Present Illness: Kathleen Rodriguez is a 53 year old female with a history of asthma, obstructive sleep apnea (on CPAP), bilateral knee osteoarthritis (status post left total knee arthroplasty in 08/2016) here for an office visit. She complains of a residual rash on her left foot and has been unable to see Dermatology but requests a refill of Urea cream. She had chronic left leg cellulitis which has improved. For the last 2 weeks her teeth have been painful; she called [facility] but they would not see her at this time.Tylenol provides mild relief of symptoms. Denies discharge, gum swelling or fever. She also complains of vaginal itching and discharge which is whitish; denies urinary symptoms.  Past Medical History:  Diagnosis Date  . Arthritis    "knees, lower back; legs, ankles" (01/27/2016)  . Asthma    followed by Dr. Craige Cotta  . CHF (congestive heart failure) (HCC) 2016   "when I went into a coma"  . Cocaine abuse (HCC)   . 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 Dyspnea   . GERD (gastroesophageal reflux disease)   . Hypertension    "doctor took me off RX in 2016" (01/27/2016)  . Influenza B April 2014   Complicated by multi-organ failure  . OSA on CPAP "since "  03/20/2013  . Pneumonia 2016  . Required emergent intubation    asthma exacerbation in 2016  . Tobacco abuse   . Upper airway cough syndrome     Past Surgical History:  Procedure Laterality Date  . BREAST SURGERY Right 1980   as a teenager , cyst was benign  . CESAREAN SECTION  2006  . LACERATION REPAIR Right ~ 1997   "tried to cut myself"  . 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 TUBAL LIGATION  2006    Allergies  Allergen Reactions  . Tomato Hives, Itching and Other (See Comments)    ALSO REACTS TO KETCHUP  . Latex Itching and Rash  . Wool Alcohol [Lanolin] Itching   Outpatient Encounter Medications as of 02/07/2019  Medication Sig  . albuterol (PROVENTIL HFA;VENTOLIN HFA) 108 (90 Base) MCG/ACT inhaler Inhale 2 puffs into the lungs every 6 (six) hours as needed for wheezing or shortness of breath.  Marland Kitchen aspirin 81 MG EC tablet Take 1 tablet (81 mg total) by mouth daily.  . budesonide (PULMICORT) 0.25 MG/2ML nebulizer solution Take 2 mLs (0.25 mg total) by nebulization 2 (two) times daily for 120 doses.  . Camphor-Eucalyptus-Menthol (VICKS VAPORUB EX) Apply 1 application topically daily as needed (congestion).  . cloNIDine (CATAPRES) 0.1 MG tablet Take 1 tablet (0.1 mg total) by mouth 2 (two) times daily.  . fluticasone (FLONASE)  50 MCG/ACT nasal spray Place 1 spray into both nostrils daily. (Patient taking differently: Place 1 spray into both nostrils daily as needed for allergies. )  . gabapentin (NEURONTIN) 300 MG capsule Take 1 capsule (300 mg total) by mouth 2 (two) times daily.  Marland Kitchen ipratropium (ATROVENT) 0.02 % nebulizer solution Take 2.5 mLs (0.5 mg total) by nebulization 2 (two) times daily as needed for wheezing or shortness of breath.  . levalbuterol (XOPENEX) 1.25 MG/0.5ML nebulizer solution Take 1.25 mg by nebulization 2 (two) times daily as  needed for wheezing or shortness of breath.  . Menthol (HALLS COUGH DROPS MT) Use as directed 1 drop in the mouth or throat 2 (two) times daily as needed (sore throat and cough).  . methocarbamol (ROBAXIN) 500 MG tablet Take 1 tablet (500 mg total) by mouth every 8 (eight) hours as needed for muscle spasms.  . pantoprazole (PROTONIX) 40 MG tablet Take 1 tablet (40 mg total) by mouth daily at 12 noon.  Marland Kitchen Spacer/Aero-Holding Chambers (AEROCHAMBER MV) inhaler Use as instructed  . [DISCONTINUED] aspirin EC 81 MG EC tablet Take 1 tablet (81 mg total) by mouth daily.  . [DISCONTINUED] cloNIDine (CATAPRES) 0.1 MG tablet Take 1 tablet (0.1 mg total) by mouth 2 (two) times daily.  . [DISCONTINUED] gabapentin (NEURONTIN) 300 MG capsule Take 1 capsule (300 mg total) by mouth 2 (two) times daily.  . [DISCONTINUED] pantoprazole (PROTONIX) 40 MG tablet Take 1 tablet (40 mg total) by mouth daily at 12 noon.  Marland Kitchen acetaminophen (TYLENOL) 500 MG tablet Take 500 mg by mouth every 6 (six) hours as needed for mild pain.  Marland Kitchen amoxicillin (AMOXIL) 500 MG capsule Take 1 capsule (500 mg total) by mouth 3 (three) times daily.  . benzonatate (TESSALON) 200 MG capsule Take 1 capsule (200 mg total) by mouth 2 (two) times daily as needed for cough. (Patient not taking: Reported on 02/07/2019)  . [EXPIRED] fluconazole (DIFLUCAN) 150 MG tablet Take 1 tablet (150 mg total) by mouth once for 1 dose.  . Misc. Devices MISC Blood pressure monitor  Dx: Hypertension  . urea (CARMOL) 20 % cream Apply topically as needed.   No facility-administered encounter medications on file as of 02/07/2019.     Observations/Objective: Awake, alert, oriented x3 Not in acute distress  CMP Latest Ref Rng & Units 12/25/2018 12/24/2018 11/07/2018  Glucose 70 - 99 mg/dL 161(W) 960(A) 74  BUN 6 - 20 mg/dL Creatinine 0.44 - 1.00 mg/dL 5.40 9.81 1.91  Sodium 135 - 145 mmol/L 138 139 140  Potassium 3.5 - 5.1 mmol/L 4.3 4.2 4.3  Chloride 98 - 111  mmol/L 108 107 102  CO2 22 - 32 mmol/L Calcium 8.9 - 10.3 mg/dL 4.7(W) 8.1(L) 8.9  Total Protein 6.0 - 8.5 g/dL - - 6.4  Total Bilirubin 0.0 - 1.2 mg/dL - - 0.3  Alkaline Phos 39 - 117 IU/L - - 59  AST 0 - 40 IU/L - - 15  ALT 0 - 32 IU/L - - 13     Assessment and Plan: 1. Neuropathy Controlled - gabapentin (NEURONTIN) 300 MG capsule; Take 1 capsule (300 mg total) by mouth 2 (two) times daily.  Dispense: 60 capsule; Refill: 2  2. Pain, dental Advised to use warm water and gargling oral solutions - amoxicillin (AMOXIL) 500 MG capsule; Take 1 capsule (500 mg total) by mouth 3 (three) times daily.  Dispense: 30 capsule; Refill: 0  3. Essential hypertension Unable to  assess control at this time Written prescription for BP monitor for homemonitoring - Misc. Devices MISC; Blood pressure monitor  Dx: Hypertension  Dispense: 1 each; Refill: 0  4. Vaginal itching - fluconazole (DIFLUCAN) 150 MG tablet; Take 1 tablet (150 mg total) by mouth once for 1 dose.  Dispense: 1 tablet; Refill: 0  5. Rash - urea (CARMOL) 20 % cream; Apply topically as needed.  Dispense: 85 g; Refill: 1  6. Hot flashes Uncontrolled on Clonidine but her BP tends to be on the low side hence I am unable to raise her dose. Advised to use Estroven OTC   Follow Up Instructions:    I discussed the assessment and treatment plan with the patient. The patient was provided an opportunity to ask questions and all were answered. The patient agreed with the plan and demonstrated an understanding of the instructions.   The patient was advised to call back or seek an in-person evaluation if the symptoms worsen or if the condition fails to improve as anticipated.     I provided 15 minutes total of non-face-to-face time during this encounter including median intraservice time, reviewing previous notes, labs, imaging, medications and explaining diagnosis and management.     Hoy RegisterEnobong Kimi Bordeau, MD, FAAFP. [facility] [address] [place], [place] [phone]   02/07/2019, 5:01 PM

## 2019-02-15 DIAGNOSIS — I10 Essential (primary) hypertension: Secondary | ICD-10-CM | POA: Diagnosis not present

## 2019-02-22 DIAGNOSIS — L03119 Cellulitis of unspecified part of limb: Secondary | ICD-10-CM | POA: Diagnosis not present

## 2019-02-22 DIAGNOSIS — R269 Unspecified abnormalities of gait and mobility: Secondary | ICD-10-CM | POA: Diagnosis not present

## 2019-02-22 DIAGNOSIS — J4551 Severe persistent asthma with (acute) exacerbation: Secondary | ICD-10-CM | POA: Diagnosis not present

## 2019-02-22 DIAGNOSIS — J9690 Respiratory failure, unspecified, unspecified whether with hypoxia or hypercapnia: Secondary | ICD-10-CM | POA: Diagnosis not present

## 2019-03-07 ENCOUNTER — Other Ambulatory Visit: Payer: Self-pay

## 2019-03-07 ENCOUNTER — Encounter: Payer: Self-pay | Admitting: Acute Care

## 2019-03-07 ENCOUNTER — Ambulatory Visit (INDEPENDENT_AMBULATORY_CARE_PROVIDER_SITE_OTHER): Payer: Medicaid Other | Admitting: Acute Care

## 2019-03-07 ENCOUNTER — Telehealth: Payer: Self-pay | Admitting: Pulmonary Disease

## 2019-03-07 DIAGNOSIS — J4541 Moderate persistent asthma with (acute) exacerbation: Secondary | ICD-10-CM

## 2019-03-07 MED ORDER — FLUCONAZOLE 100 MG PO TABS: 100.0000 mg | ORAL_TABLET | Freq: Every day | ORAL | 0 refills | Status: DC

## 2019-03-07 MED ORDER — DOXYCYCLINE HYCLATE 100 MG PO TABS: 100.0000 mg | ORAL_TABLET | Freq: Two times a day (BID) | ORAL | 0 refills | Status: DC

## 2019-03-07 MED ORDER — PREDNISONE 10 MG PO TABS: ORAL_TABLET | ORAL | 0 refills | Status: DC

## 2019-03-07 NOTE — Patient Instructions (Addendum)
It is good to talk with you today. We will send in a prescription for prednisone Prednisone taper; 10 mg tablets: 4 tabs x 2 days, 3 tabs x 2 days, 2 tabs x 2 days 1 tab x 2 days then stop. Doxycycline 100 mg BID x 7 days Diflucan 100 mg x 5 days use with antibiotics/ prednisone taper Continue  Pulmicort Neb Twice daily  , rinse after use.  Saline nasal rinses As needed   Begin Flonase nasal spray 2 puffs in each nostril  daily  Continue  Zyrtec or Claritin 10 mg at bedtime. Mucinex DM twice daily as needed for cough and congestion.>> With a full glass of water>> We will provide you with Samples Use albuterol nebulizer as rescue only. For breakthrough  wheezing and shortness of breath.   Continue on CPAP at bedtime. You appear to be benefiting from the treatment  Goal is to wear for at least 6 hours each night for maximal clinical benefit. Continue to work on weight loss, as the link between excess weight  and sleep apnea is well established.   Remember to establish a good bedtime routine, and work on sleep hygiene.  Limit daytime naps , avoid stimulants such as caffeine and nicotine close to bedtime, exercise daily to promote sleep quality, avoid heavy , spicy, fried , or rich foods before bed. Ensure adequate exposure to natural light during the day,establish a relaxing bedtime routine with a pleasant sleep environment ( Bedroom between 60 and 67 degrees, turn off bright lights , TV or device screens screens , consider black out curtains or white noise machines) Do not drive if sleepy. Remember to clean mask, tubing, filter, and reservoir once weekly with soapy water.  Follow up visit in 1 week  at the office with 5/21 at 3 pm with Elisha Headland, NP Goal is to wear at least 6 hours each night Please contact office for sooner follow up if symptoms do not improve or worsen or seek emergency care

## 2019-03-07 NOTE — Progress Notes (Signed)
Virtual Visit via Telephone Note  I connected with [name] on 03/07/19 at  3:00 PM EDT by telephone and verified that I am speaking with the correct person using two identifiers.  Location: Patient: At home Provider: In the office at [facility] [place], [place], [place]   I discussed the limitations, risks, security and privacy concerns of performing an evaluation and management service by telephone and the availability of in person appointments. I also discussed with the patient that there may be a patient responsible charge related to this service. The patient expressed understanding and agreed to proceed.  I  confirmed date of birth and address to authenticate patient  Identity. My nurse Quentin Ore reviewed medications and ordered any refills required.  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 Maintenance is Pulmicort nebs BID Albuterol nebs as rescue She is followed by Dr. Halford Chessman    History of Present Illness: Pt. Presents with  History increased wheezing for the past 2 days. Stated that she is wheezing all day. Also noticed that she has a cough that productive with clear phlegm. She denied having any fevers. She endorses increased SOB with activity. She denies  any body aches or COVID exposures. She has been using her Pulmicort and albuterol nebs as prescribed. She also stated that she normally uses Mucinex but does not have the money for it now. She states her secretions are clear to yellow. She denies chest pain, orthopnea or hemoptysis.     Observations/Objective: CT chest4/15/14 >>no PE, no acute findings Labs 02/11/13 >> ANA negative, Anti-GBM Ab < 1, ANCA negative PFT 05/15/13>>FEV1 1.90 (76%), FEV1% 72, TLC 4.23 (81%),  DLCO 89%, no BD PFT 03/03/17 >> FEV1 1.57 (64%), FEV1% 64, TLC 4.47 (84%), DLCO 685, + BD RAST 07/28/17 >> mild reaction to trees, ragweed; IgE 446 HRCT chest 03/12/18 >> mild BTX  Sleep tests: PSG 04/19/13 >> AHI 16.4, SpO2 low 84%.  Echo9/16/18 >> EF 60 to 65%  Assessment and Plan: Asthma Flare We will send in a prescription for prednisone Prednisone taper; 10 mg tablets: 4 tabs x 2 days, 3 tabs x 2 days, 2 tabs x 2 days 1 tab x 2 days then stop. Doxycycline 100 mg BID x 7 days Diflucan 100 mg x 5 days use with antibiotics/ prednisone taper if you develop a yeast infection Continue  Pulmicort Neb Twice daily  , rinse after use.  Saline nasal rinses prn   Begin Flonase nasal spray 2 puffs in each nostril  daily  Continue  Zyrtec or Claritin 10 mg at bedtime. Mucinex DM twice daily as needed for cough and congestion.>> We will provide you with Samples>> with a full glass of water Use albuterol nebulizer as rescue only. For breakthrough  wheezing and shortness of breath.  OSA Continue on CPAP at bedtime. You appear to be benefiting from the treatment. Goal is to wear for at least 6 hours each night for maximal clinical benefit. Continue to work on weight loss, as the link between excess weight  and sleep apnea is well established.   Remember to establish a good bedtime routine, and work on sleep hygiene.  Limit daytime naps , avoid stimulants such as caffeine and nicotine close to bedtime, exercise daily to promote sleep quality, avoid heavy , spicy, fried ,  or rich foods before bed. Ensure adequate exposure to natural light during the day,establish a relaxing bedtime routine with a pleasant sleep environment ( Bedroom between 60 and 67 degrees, turn off bright lights , TV or device screens screens , consider black out curtains or white noise machines) Do not drive if sleepy. Remember to clean mask, tubing, filter, and reservoir once weekly with soapy water.  Please contact office for  sooner follow up if symptoms do not improve or worsen or seek emergency care     Follow Up Instructions: Follow up visit in 1 week  at the office with 5/21 at 3 pm with Wyn Quaker, NP   I discussed the assessment and treatment plan with the patient. The patient was provided an opportunity to ask questions and all were answered. The patient agreed with the plan and demonstrated an understanding of the instructions.   The patient was advised to call back or seek an in-person evaluation if the symptoms worsen or if the condition fails to improve as anticipated.  I provided 21 minutes of non-face-to-face time during this encounter.   Magdalen Spatz, NP 03/07/2019 3:41 PM

## 2019-03-07 NOTE — Telephone Encounter (Signed)
Called and spoke with Patient.  Tammy, NP recommendations given.  Patient does have my chart, but  requested televist, because her son was not home and he had helped her with lap top. Televisit scheduled with Maralyn Sago, NP at 3pm today.  Nothing further at this time.

## 2019-03-07 NOTE — Telephone Encounter (Signed)
Please see if she can do video visit with Huntley Dec today

## 2019-03-07 NOTE — Telephone Encounter (Signed)
Primary Pulmonologist: VS Last office visit and with whom: 01/28/19 w/TP  What do we see them for (pulmonary problems): asthma and OSA Last OV assessment/plan: Restart Pulmicort Neb Twice daily  , rinse after use.  Saline nasal rinses As needed   Begin Flonase nasal 2 puffs daily  Begin Zyrtec or Claritin 10 mg at bedtime. Mucinex DM twice daily as needed for cough and congestion. Use albuterol nebulizer as rescue only. For wheezing and shortness of breath. Continue on CPAP at bedtime. Try to wear each night .  Goal is to wear at least 6 hours each night Followup with Dr. Craige Cotta in 2-3  months and As needed  .  Please contact office for sooner follow up if symptoms do not improve or worsen or seek emergency care    Was appointment offered to patient (explain)?  Patient wanted recommendations first.    Reason for call: Spoke with patient. She stated that she has noticed some increased wheezing for the past 2 days. Stated that she is wheezing all day. Also noticed that she has a cough that productive with clear phlegm. She denied having any fevers. Increased SOB with activity. Also denied any body aches and being around anyone with COVID. She has been using her Pulmicort and albuterol nebs as prescribed. She also stated that she normally uses Mucinex but does not have the money for it now. She wanted to know if I could leave samples up front for her. I advised her that I would but wait for the call back before she comes to the office.   She wants to know if she can have a prednisone taper called in for her.   Pharmacy is [facility] on [address].   TP, please advise since you were the last to see her on 01/28/19. Thanks!

## 2019-03-14 ENCOUNTER — Ambulatory Visit: Payer: Medicaid Other | Admitting: Pulmonary Disease

## 2019-03-15 ENCOUNTER — Telehealth: Payer: Self-pay

## 2019-03-15 ENCOUNTER — Telehealth: Payer: Self-pay | Admitting: Pulmonary Disease

## 2019-03-15 MED ORDER — PROAIR HFA 108 (90 BASE) MCG/ACT IN AERS: 2.0000 | INHALATION_SPRAY | Freq: Four times a day (QID) | RESPIRATORY_TRACT | 3 refills | Status: DC | PRN

## 2019-03-15 NOTE — Telephone Encounter (Signed)
Primary Pulmonologist: VS Last office visit and with whom: 03/07/2019 with SG What do we see them for (pulmonary problems): asthma Last OV assessment/plan: Assessment and Plan: Asthma Flare We will send in a prescription for prednisone Prednisone taper; 10 mg tablets: 4 tabs x 2 days, 3 tabs x 2 days, 2 tabs x 2 days 1 tab x 2 days then stop. Doxycycline 100 mg BID x 7 days Diflucan 100 mg x 5 days use with antibiotics/ prednisone taper if you develop a yeast infection Continue  Pulmicort Neb Twice daily , rinse after use.  Saline nasal rinses prn  Begin Flonase nasal spray 2 puffs in each nostril  daily  Continue  Zyrtec or Claritin 10 mg at bedtime. Mucinex DM twice daily as needed for cough and congestion.>> We will provide you with Samples>> with a full glass of water Use albuterol nebulizer as rescue only. For breakthrough  wheezing and shortness of breath.  OSA Continue on CPAP at bedtime. You appear to be benefiting from the treatment. Goal is to wear for at least 6 hours each night for maximal clinical benefit. Continue to work on weight loss, as the link between excess weight  and sleep apnea is well established.  Remember to establish a good bedtime routine, and work on sleep hygiene. Limit daytime naps , avoid stimulants such as caffeine and nicotine close to bedtime, exercise daily to promote sleep quality, avoid heavy , spicy, fried , or rich foods before bed. Ensure adequate exposure to natural light during the day,establish a relaxing bedtime routine with a pleasant sleep environment ( Bedroom between 60 and 67 degrees, turn off bright lights , TV or device screens screens , consider black out curtains or white noise machines) Do not drive if sleepy. Remember to clean mask, tubing, filter, and reservoir once weekly with soapy water.  Please contact office for sooner follow up if symptoms do not improve or worsen or seek emergency care    Follow Up  Instructions: Follow up visit in 1 week at the office with 5/21 at 3 pm with Elisha Headland, NP  I discussed the assessment and treatment plan with the patient. The patient was provided an opportunity to ask questions and all were answered. The patient agreed with the plan and demonstrated an understanding of the instructions.  The patient was advised to call back or seek an in-person evaluation if the symptoms worsen or if the condition fails to improve as anticipated.  Was appointment offered to patient (explain)?  Pt requesting a different inhaler   Reason for call: called and spoke with pt who states she is currently taking albuterol inhaler as she usually uses Proair. Pt states the albuterol inhaler she was given is making her throat hurt and was wanting to get a different inhaler. I stated to pt to go to the pharmacy and take the inhaler with her that was given to her and tell the pharmacy staff what is happening while taking that inhaler to see if they can switch it out with a different one and pt verbalized understanding.  Beth, please advise if you have any other suggestions for pt. Thanks!

## 2019-03-15 NOTE — Telephone Encounter (Signed)
Patient came by office to drop off her [employer] forms. Forms have been filled out and faxed to [employer]. Will make a copy of forms and send to scan to be uploaded into epic. Left message informing patient. Nothing further is needed at this time.

## 2019-03-15 NOTE — Telephone Encounter (Signed)
Spoke with the pt  She states that she spoke with her pharmacy and they told her that if we sent rx for proair they could now fill that  They did not have it in stock before and this is why they gave her proventil  I have sent in new rx  Nothing further needed

## 2019-03-15 NOTE — Telephone Encounter (Signed)
I would get her a spacer to use with albuterol inhaler. Otherwise we can try proair respiclick or xopenex hfa

## 2019-03-24 NOTE — Progress Notes (Deleted)
@Patient  ID: Kathleen Rodriguez, female    DOB: 1966-02-09, 53 y.o.   MRN: [id number]  No chief complaint on file.   Referring provider: Hoy Register, MD  HPI:   PMH:  Smoker/ Smoking History:  Maintenance:   Pt of: Dr. 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   03/24/2019  - Visit   HPI  Tests:   CT chest4/15/14 >>no PE, no acute findings Labs 02/11/13 >> ANA negative, Anti-GBM Ab < 1, ANCA negative PFT 05/15/13>>FEV1 1.90 (76%), FEV1% 72, TLC 4.23 (81%), DLCO 89%, no BD PFT 03/03/17 >> FEV1 1.57 (64%), FEV1% 64, TLC 4.47 (84%), DLCO 685, + BD RAST 07/28/17 >> mild reaction to trees, ragweed; IgE 446 HRCT chest 03/12/18 >> mild BTX  Sleep tests: PSG 04/19/13 >> AHI 16.4, SpO2 low 84%.  Echo9/16/18 >> EF 60 to 65%  FENO:  No results found for: NITRICOXIDE  PFT: PFT Results Latest Ref Rng & Units 03/03/2017  FVC-Pre L 2.36  FVC-Predicted Pre % 77  FVC-Post L 2.45  FVC-Predicted Post % 80  Pre FEV1/FVC % % 58  Post FEV1/FCV % % 64  FEV1-Pre L 1.38  FEV1-Predicted Pre % 56  FEV1-Post L 1.57  DLCO UNC% % 68  DLCO COR %Predicted % 98  TLC L 4.47  TLC % Predicted % 84  RV % Predicted % 108    Imaging: No results found.    Specialty Problems      Pulmonary Problems   Uncontrolled persistent asthma    07/2017 Mildly allergic to tree, ragweed IgE elevated ~400       OSA (obstructive sleep apnea)   Asthma exacerbation    Recurrent flare ? VCD >05/22/2014 refer to ENT       Chronic cough   Upper airway cough syndrome   COPD exacerbation (HCC)   Asthma   Acute on chronic respiratory failure with hypoxia (HCC)      Allergies  Allergen Reactions  . Tomato Hives, Itching and Other (See Comments)    ALSO REACTS TO KETCHUP  . Latex Itching and  Rash  . Wool Alcohol [Lanolin] Itching    Immunization History  Administered Date(s) Administered  . Influenza,inj,Quad PF,6+ Mos 08/13/2013, 07/27/2015, 07/21/2016, 10/22/2017, 08/01/2018  . Tdap 08/14/2013    Past Medical History:  Diagnosis Date  . Arthritis    "knees, lower back; legs, ankles" (01/27/2016)  . Asthma    followed by Dr. Craige Cotta  . CHF (congestive heart failure) (HCC) 2016   "when I went into a coma"  . Cocaine abuse (HCC)   . 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 Dyspnea   . GERD (gastroesophageal reflux disease)   . Hypertension    "doctor took me off RX in 2016" (01/27/2016)  . Influenza B April 2014   Complicated by multi-organ failure  . OSA on CPAP "since " 03/20/2013  . Pneumonia 2016  . Required emergent intubation    asthma exacerbation in 2016  . Tobacco abuse   . Upper airway cough syndrome     Tobacco History: Social History   Tobacco Use  Smoking Status Former Smoker  . Packs/day: 0.25  . Years: 20.00  . Pack years: 5.00  . Types: Cigarettes  . Last attempt to quit: 01/31/2013  . Years since quitting: 6.1  Smokeless Tobacco Never  Used   Counseling given: Not Answered   Continue to not smoke  Outpatient Encounter Medications as of 03/25/2019  Medication Sig  . acetaminophen (TYLENOL) 500 MG tablet Take 500 mg by mouth every 6 (six) hours as needed for mild pain.  Marland Kitchen albuterol (PROVENTIL HFA;VENTOLIN HFA) 108 (90 Base) MCG/ACT inhaler Inhale 2 puffs into the lungs every 6 (six) hours as needed for wheezing or shortness of breath.  Marland Kitchen aspirin 81 MG EC tablet Take 1 tablet (81 mg total) by mouth daily.  . benzonatate (TESSALON) 200 MG capsule Take 1 capsule (200 mg total) by mouth 2 (two) times daily as needed for cough.  . budesonide (PULMICORT) 0.25 MG/2ML nebulizer solution Take 2 mLs (0.25 mg total) by nebulization 2 (two) times daily for 120 doses.  . Camphor-Eucalyptus-Menthol (VICKS VAPORUB EX) Apply 1 application topically daily as  needed (congestion).  . cloNIDine (CATAPRES) 0.1 MG tablet Take 1 tablet (0.1 mg total) by mouth 2 (two) times daily.  Marland Kitchen doxycycline (VIBRA-TABS) 100 MG tablet Take 1 tablet (100 mg total) by mouth 2 (two) times daily.  . fluconazole (DIFLUCAN) 100 MG tablet Take 1 tablet (100 mg total) by mouth daily.  . fluticasone (FLONASE) 50 MCG/ACT nasal spray Place 1 spray into both nostrils daily. (Patient taking differently: Place 1 spray into both nostrils daily as needed for allergies. )  . gabapentin (NEURONTIN) 300 MG capsule Take 1 capsule (300 mg total) by mouth 2 (two) times daily.  Marland Kitchen ipratropium (ATROVENT) 0.02 % nebulizer solution Take 2.5 mLs (0.5 mg total) by nebulization 2 (two) times daily as needed for wheezing or shortness of breath.  . levalbuterol (XOPENEX) 1.25 MG/0.5ML nebulizer solution Take 1.25 mg by nebulization 2 (two) times daily as needed for wheezing or shortness of breath.  . Menthol (HALLS COUGH DROPS MT) Use as directed 1 drop in the mouth or throat 2 (two) times daily as needed (sore throat and cough).  . methocarbamol (ROBAXIN) 500 MG tablet Take 1 tablet (500 mg total) by mouth every 8 (eight) hours as needed for muscle spasms.  . Misc. Devices MISC Blood pressure monitor  Dx: Hypertension  . pantoprazole (PROTONIX) 40 MG tablet Take 1 tablet (40 mg total) by mouth daily at 12 noon.  . predniSONE (DELTASONE) 10 MG tablet Take 4 tabs for 2 days, then 3 tabs for 2 days, 2 tabs for 2 days, then 1 tab for 2 days, then stop.  Marland Kitchen PROAIR HFA 108 (90 Base) MCG/ACT inhaler Inhale 2 puffs into the lungs every 6 (six) hours as needed for wheezing or shortness of breath.  . Spacer/Aero-Holding Chambers (AEROCHAMBER MV) inhaler Use as instructed  . urea (CARMOL) 20 % cream Apply topically as needed.   No facility-administered encounter medications on file as of 03/25/2019.      Review of Systems  Review of Systems   Physical Exam  There were no vitals taken for this visit.  Wt  Readings from Last 5 Encounters:  01/01/19 254 lb 6.4 oz (115.4 kg)  12/25/18 253 lb 15.5 oz (115.2 kg)  11/07/18 252 lb 12.8 oz (114.7 kg)  09/05/18 259 lb 9.6 oz (117.8 kg)  08/29/18 251 lb (113.9 kg)     Physical Exam    Lab Results:  CBC    Component Value Date/Time   WBC 6.2 12/25/2018 0650   RBC 4.34 12/25/2018 0650   HGB 11.8 (L) 12/25/2018 0650   HGB 13.3 11/07/2018 1041   HCT 37.9 12/25/2018 0650  HCT 42.2 11/07/2018 1041   PLT 311 12/25/2018 0650   PLT 347 11/07/2018 1041   MCV 87.3 12/25/2018 0650   MCV 87 11/07/2018 1041   MCH 27.2 12/25/2018 0650   MCHC 31.1 12/25/2018 0650   RDW 19.3 (H) 12/25/2018 0650   RDW 15.2 11/07/2018 1041   LYMPHSABS 1.3 12/24/2018 1915   LYMPHSABS 2.6 11/07/2018 1041   MONOABS 0.9 12/24/2018 1915   EOSABS 0.0 12/24/2018 1915   EOSABS 0.3 11/07/2018 1041   BASOSABS 0.1 12/24/2018 1915   BASOSABS 0.1 11/07/2018 1041    BMET    Component Value Date/Time   NA 138 12/25/2018 0650   NA 140 11/07/2018 1041   K 4.3 12/25/2018 0650   CL 108 12/25/2018 0650   CO2 23 12/25/2018 0650   GLUCOSE 126 (H) 12/25/2018 0650   BUN 14 12/25/2018 0650   BUN 13 11/07/2018 1041   CREATININE 0.62 12/25/2018 0650   CALCIUM 8.5 (L) 12/25/2018 0650   GFRNONAA >60 12/25/2018 0650   GFRAA >60 12/25/2018 0650    BNP    Component Value Date/Time   BNP 46.9 12/24/2018 1915    ProBNP    Component Value Date/Time   PROBNP 97.2 07/01/2014 1740      Assessment & Plan:   No problem-specific Assessment & Plan notes found for this encounter.    No follow-ups on file.   Lauraine Rinne, NP 03/24/2019   This appointment was *** minutes long with over 50% of the time in direct face-to-face patient care, assessment, plan of care, and follow-up.

## 2019-03-25 ENCOUNTER — Ambulatory Visit: Payer: Medicaid Other | Admitting: Pulmonary Disease

## 2019-03-25 ENCOUNTER — Telehealth: Payer: Self-pay | Admitting: Pulmonary Disease

## 2019-03-25 ENCOUNTER — Telehealth: Payer: Self-pay | Admitting: Family Medicine

## 2019-03-25 DIAGNOSIS — J4551 Severe persistent asthma with (acute) exacerbation: Secondary | ICD-10-CM | POA: Diagnosis not present

## 2019-03-25 DIAGNOSIS — J9690 Respiratory failure, unspecified, unspecified whether with hypoxia or hypercapnia: Secondary | ICD-10-CM | POA: Diagnosis not present

## 2019-03-25 DIAGNOSIS — L03119 Cellulitis of unspecified part of limb: Secondary | ICD-10-CM | POA: Diagnosis not present

## 2019-03-25 DIAGNOSIS — R269 Unspecified abnormalities of gait and mobility: Secondary | ICD-10-CM | POA: Diagnosis not present

## 2019-03-25 MED ORDER — LEVALBUTEROL HCL 1.25 MG/0.5ML IN NEBU: 1.2500 mg | INHALATION_SOLUTION | Freq: Two times a day (BID) | RESPIRATORY_TRACT | 0 refills | Status: DC | PRN

## 2019-03-25 MED ORDER — PREDNISONE 10 MG PO TABS: ORAL_TABLET | ORAL | 0 refills | Status: DC

## 2019-03-25 NOTE — Telephone Encounter (Signed)
Patient has been placed on the schedule and patient will be notified.

## 2019-03-25 NOTE — Telephone Encounter (Signed)
New Message   Pt states she is having spasm in her feet. Please f/u

## 2019-03-25 NOTE — Telephone Encounter (Signed)
Called & spoke w/ pt in regards to SG's recommendations. Pt verbalized understanding with no additional questions. I verified pt's preferred pharmacy. Pt also agreed to set up a televisit appointment with SG on 04/01/2019 at 11:00 AM. Pt was originally supposed to come into the office that same date/time to see Elisha Headland, however, per SG's recommendations, appt has been rescheduled to a televisit w/ SG. Pt has been made aware of this.   Orders for albuterol neb sol'n & pred taper has been sent per SG. Appt has been scheduled. Nothing further needed at this time.

## 2019-03-25 NOTE — Telephone Encounter (Signed)
Send in albuterol nebs Send in pred taper  Prednisone taper; 10 mg tablets: 4 tabs x 2 days, 3 tabs x 2 days, 2 tabs x 2 days 1 tab x 2 days then stop. She needs a follow up video visit/ televisit to ensure she is improving or better in a week. Tell her if she gets worse not better to call the office or seek emergency care.

## 2019-03-25 NOTE — Addendum Note (Signed)
Addended by: Sabra Heck D on: 03/25/2019 12:29 PM   Modules accepted: Orders

## 2019-03-25 NOTE — Telephone Encounter (Signed)
Primary Pulmonologist: VS Last office visit and with whom: 03/07/2019 with SG What do we see them for (pulmonary problems): asthma Last OV assessment/plan: Instructions  Return in about 1 week (around 03/14/2019), or if symptoms worsen or fail to improve.  It is good to talk with you today. We will send in a prescription for prednisone Prednisone taper; 10 mg tablets: 4 tabs x 2 days, 3 tabs x 2 days, 2 tabs x 2 days 1 tab x 2 days then stop. Doxycycline 100 mg BID x 7 days Diflucan 100 mg x 5 days use with antibiotics/ prednisone taper Continue  Pulmicort Neb Twice daily , rinse after use.  Saline nasal rinses As needed  Begin Flonase nasal spray 2 puffs in each nostril  daily  Continue  Zyrtec or Claritin 10 mg at bedtime. Mucinex DM twice daily as needed for cough and congestion.>> With a full glass of water>> We will provide you with Samples Use albuterol nebulizer as rescue only. For breakthrough  wheezing and shortness of breath.   Continue on CPAP at bedtime. You appear to be benefiting from the treatment Goal is to wear for at least 6 hours each night for maximal clinical benefit. Continue to work on weight loss, as the link between excess weight  and sleep apnea is well established.  Remember to establish a good bedtime routine, and work on sleep hygiene. Limit daytime naps , avoid stimulants such as caffeine and nicotine close to bedtime, exercise daily to promote sleep quality, avoid heavy , spicy, fried , or rich foods before bed. Ensure adequate exposure to natural light during the day,establish a relaxing bedtime routine with a pleasant sleep environment ( Bedroom between 60 and 67 degrees, turn off bright lights , TV or device screens screens , consider black out curtains or white noise machines) Do not drive if sleepy. Remember to clean mask, tubing, filter, and reservoir once weekly with soapy water.  Follow up visit in 1 week at the office with 5/21 at 3 pm with Elisha Headland, NP Goal is to wear at least 6 hours each night Please contact office for sooner follow up if symptoms do not improve or worsen or seek emergency care     Pt's original scheduled OV with Arlys John 5/21 was rescheduled for 6/8 with Arlys John per pt  Was appointment offered to patient (explain)?  Pt needs albuterol neb sol and prednisone   Reason for call: called and spoke with pt who stated she began wheezing yesterday 03/24/2019, has a cough with clear mucus, and has some mild chest tightness but main complaint. Pt stated she has had to use her rescue inhaler three times to see if it would help with her symptoms.  Asked pt if she had an Rx of albuterol neb sol and pt stated that she did but has run out and needs a new Rx to be sent to pharmacy. I see in pt's last OV with SG where she did say for pt to use albuterol nebulizer as rescue only but it is not listed on pt's current med list. Neb meds we have on here are xopenex neb sol, atrovent neb sol, and pulmicort neb sol and pt verbalized to me that she has been doing the pulmicort neb sol bid and was using the albuterol neb sol prn.   Along with a Rx of albuterol neb sol to be sent to the pharmacy, pt is also requesting prednisone Rx to be sent to pharmacy to help with  her symptoms.  Pt denies any complaints of fever or SOB and denies any complaints of body aches or chills.  Sarah, please advise on this for pt. Thanks!

## 2019-03-27 ENCOUNTER — Other Ambulatory Visit: Payer: Self-pay | Admitting: Family Medicine

## 2019-03-27 DIAGNOSIS — G629 Polyneuropathy, unspecified: Secondary | ICD-10-CM

## 2019-04-01 ENCOUNTER — Ambulatory Visit: Payer: Medicaid Other | Admitting: Pulmonary Disease

## 2019-04-01 ENCOUNTER — Ambulatory Visit: Payer: Medicaid Other | Admitting: Family Medicine

## 2019-04-01 ENCOUNTER — Encounter: Payer: Self-pay | Admitting: Acute Care

## 2019-04-01 ENCOUNTER — Telehealth: Payer: Self-pay | Admitting: Acute Care

## 2019-04-01 ENCOUNTER — Other Ambulatory Visit: Payer: Self-pay

## 2019-04-01 ENCOUNTER — Ambulatory Visit (INDEPENDENT_AMBULATORY_CARE_PROVIDER_SITE_OTHER): Payer: Medicaid Other | Admitting: Acute Care

## 2019-04-01 DIAGNOSIS — J45901 Unspecified asthma with (acute) exacerbation: Secondary | ICD-10-CM | POA: Diagnosis not present

## 2019-04-01 MED ORDER — FLUCONAZOLE 100 MG PO TABS: 100.0000 mg | ORAL_TABLET | Freq: Every day | ORAL | 0 refills | Status: DC

## 2019-04-01 MED ORDER — ALBUTEROL SULFATE (2.5 MG/3ML) 0.083% IN NEBU: 2.5000 mg | INHALATION_SOLUTION | Freq: Four times a day (QID) | RESPIRATORY_TRACT | 2 refills | Status: DC | PRN

## 2019-04-01 MED ORDER — PREDNISONE 10 MG PO TABS: ORAL_TABLET | ORAL | 0 refills | Status: DC

## 2019-04-01 MED ORDER — DOXYCYCLINE HYCLATE 100 MG PO TABS: 100.0000 mg | ORAL_TABLET | Freq: Two times a day (BID) | ORAL | 0 refills | Status: DC

## 2019-04-01 NOTE — Patient Instructions (Addendum)
It is good to talk with you today We will call in a prescription for Doxycycline 100 mg twice daily x 7 days We will send in a prescription for Diflucan for yeast infection We will repeat your prednisone taper  Prednisone taper; 10 mg tablets: 4 tabs x 2 days, 3 tabs x 2 days, 2 tabs x 2 days 1 tab x 2 days then stop. We will renew your albuterol nebs Use both your albuterol and the pulmicort nebs Do nebs at least twice daily while sick Continue Singulair daily Continue Mucinex 1200 mg daily with a full glass of water Try to stay out of the heat as we know this is a trigger for your asthma. If breathing gets worse not better, seek emergency care  Follow up in the office to determine next best biologic option for better control of asthma Follow up telephone call in 1 week. Please contact office for sooner follow up if symptoms do not improve or worsen or seek emergency care

## 2019-04-01 NOTE — Progress Notes (Signed)
Virtual Visit via Telephone Note  I connected with Kathleen Rodriguez on 04/01/19 at 11:00 AM EDT by telephone and verified that I am speaking with the correct person using two identifiers.  Location: Patient: At home Provider: In the office   I discussed the limitations, risks, security and privacy concerns of performing an evaluation and management service by telephone and the availability of in person appointments. I also discussed with the patient that there may be a patient responsible charge related to this service. The patient expressed understanding and agreed to proceed.  I  confirmed date of birth and address to authenticate  Identity. My nurse Mont Dutton  reviewed medications and ordered any refills required.   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 Maintenance is Pulmicort nebs BID Albuterol nebs as rescue She is followed by Dr. Halford Chessman   History of Present Illness: Pt. Called the office with complaints of am asthma exacerbation 03/25/2019. She was prescribed a prednisone taper and albuterol neb solution. She presents for 1 week  follow up. She did get better after her prednisone taper last week. She states she was doing well until this weekend.She states she has been having issues with the heat. She sat in a hot car for a full hour on Saturday. She states after this she developed  additional chest tightness, and shortness of breath with wheezing, and productive cough. She is using her albuterol nebs.She states she ran out today.. We will renew them. She states she has cloudy secretions when she coughs them up, and that her secretions have increased in amount.. She denies any fever, orthopnea or hemoptysis.    Observations/Objective: CT chest4/15/14 >>no PE,  no acute findings Labs 02/11/13 >> ANA negative, Anti-GBM Ab < 1, ANCA negative PFT 05/15/13>>FEV1 1.90 (76%), FEV1% 72, TLC 4.23 (81%), DLCO 89%, no BD PFT 03/03/17 >> FEV1 1.57 (64%), FEV1% 64, TLC 4.47 (84%), DLCO 685, + BD RAST 07/28/17 >> mild reaction to trees, ragweed; IgE 446 HRCT chest 03/12/18 >> mild BTX  Sleep tests: PSG 04/19/13 >> AHI 16.4, SpO2 low 84%.  Echo9/16/18 >> EF 60 to 65%  Assessment and Plan: Slow to resolve asthmatic flare  Plan We will call in a prescription for Doxycycline 100 mg twice daily x 7 days We will send in a prescription for Diflucan for yeast infection We will repeat your prednisone taper  Prednisone taper; 10 mg tablets: 4 tabs x 2 days, 3 tabs x 2 days, 2 tabs x 2 days 1 tab x 2 days then stop. We will renew your albuterol nebs Use both your albuterol and the pulmicort nebs Do nebs at least twice daily while sick Continue Singulair daily Continue Mucinex 1200 mg daily with a full glass of water Try to stay out of the heat as we know this is a trigger for your asthma. If breathing gets worse not better, seek emergency care  Follow up in the office to determine next best biologic option for better control of asthma Follow up telephone call in 1 week. Please contact office for sooner follow up if symptoms do not improve or worsen or seek emergency care   Follow Up Instructions: Will need assessment for Biologic for her asthma   I discussed the assessment and treatment plan with the patient. The patient was provided an opportunity to ask  questions and all were answered. The patient agreed with the plan and demonstrated an understanding of the instructions.   The patient was advised to call back or seek an in-person evaluation if the symptoms worsen or if the condition fails to improve as anticipated.  I provided 22 minutes of non-face-to-face time during this encounter.   Magdalen Spatz, NP  04/01/2019 5:29 PM

## 2019-04-01 NOTE — Telephone Encounter (Signed)
spoke with pt and she requested diflucan.  Sent to preferred pharmacy.  Nothing further is needed.

## 2019-04-08 ENCOUNTER — Ambulatory Visit (INDEPENDENT_AMBULATORY_CARE_PROVIDER_SITE_OTHER): Payer: Medicaid Other | Admitting: Acute Care

## 2019-04-08 ENCOUNTER — Other Ambulatory Visit: Payer: Self-pay

## 2019-04-08 ENCOUNTER — Encounter: Payer: Self-pay | Admitting: Acute Care

## 2019-04-08 DIAGNOSIS — J45901 Unspecified asthma with (acute) exacerbation: Secondary | ICD-10-CM

## 2019-04-08 MED ORDER — BREO ELLIPTA 100-25 MCG/INH IN AEPB: 1.0000 | INHALATION_SPRAY | Freq: Every day | RESPIRATORY_TRACT | 0 refills | Status: DC

## 2019-04-08 NOTE — Progress Notes (Signed)
Virtual Visit via Telephone Note  I connected with [name] on 04/08/19 at  9:30 AM EDT by telephone and verified that I am speaking with the correct person using two identifiers.  Location: Patient: At Home Provider: In the office   I discussed the limitations, risks, security and privacy concerns of performing an evaluation and management service by telephone and the availability of in person appointments. I also discussed with the patient that there may be a patient responsible charge related to this service. The patient expressed understanding and agreed to proceed.  I  confirmed date of birth and address to authenticate  Identity. My nurse Mont Dutton reviewed medications and ordered any refills required.  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 Maintenance is Pulmicort nebs BID Albuterol nebs as rescue She is followed by Dr. Halford Chessman  History of Present Illness: Pt. Was seen 03/25/2019 and Again 04/01/2019 with a slow to resolve asthma flare. She was treated with pred taper on both occasions. She was treated with Doxycycline in addition to the pred taper on 6/8 as she had some change in color of secretions. She presents today for follow up phone call. Initially the patient did not answer the phone call. We did reconnect with the patient at 10:26. She states she is doing well. She is trying to stay inside and out of the hot weather which triggers her asthma. She states she is better after treatment with Doxycycline and prednisone taper. She completed both. She is compliant with her Singulair. She is not currently on maintenance medication. She had previously been on Xolair  and had an allergic reaction, and has just been using her [employer] inhaler.She denies fever,  chest pain, orthopnea or hemoptysis.    Observations/Objective: CT chest4/15/14 >>no PE, no acute findings Labs 02/11/13 >> ANA negative, Anti-GBM Ab < 1, ANCA negative PFT 05/15/13>>FEV1 1.90 (76%), FEV1% 72, TLC 4.23 (81%), DLCO 89%, no BD PFT 03/03/17 >> FEV1 1.57 (64%), FEV1% 64, TLC 4.47 (84%), DLCO 685, + BD RAST 07/28/17 >> mild reaction to trees, ragweed; IgE 446 HRCT chest 03/12/18 >> mild BTX  Sleep tests: PSG 04/19/13 >> AHI 16.4, SpO2 low 84%.  Echo9/16/18 >> EF 60 to 65%  Assessment and Plan: Resolved Flare Plan We need to start you back on a maintenance  Inhaler We will have you come by the office and pick up a Breo sample. This is the medication you were on previously as a maintenance medicine Take one puff once daily Rinse mouth after use Call us in 1 week and let us know if you like the medication and we will send in a prescription. Continue Singulair daily Continue using the Pro Air as needed for breakthrough shortness of breath Follow up OV with Dr. Halford Chessman in 3 months Consider restarting one of the newer biologic medications Please let us know if you need [place] sooner Please contact office for sooner follow up if symptoms do not improve or worsen or seek emergency care     Follow Up Instructions:    I discussed the assessment and treatment plan with the patient. The patient was provided an opportunity to ask questions and all were answered. The patient agreed with the plan and demonstrated an understanding of the instructions.   The patient was advised to call back or seek an  in-person evaluation if the symptoms worsen or if the condition fails to improve as anticipated.  I provided 22 minutes of non-face-to-face time during this encounter.   Magdalen Spatz, NP 04/08/2019 10:39 AM

## 2019-04-08 NOTE — Patient Instructions (Signed)
I am glad you are better.  We need to start you back on a maintenance  Inhaler We will have you come by the office and pick up a Breo sample. This is the medication you were on previously as a maintenance medicine Take one puff once daily Rinse mouth after use Call us in 1 week and let us know if you like the medication and we will send in a prescription. Continue Singulair daily Continue using the Pro Air as needed for breakthrough shortness of breath Follow up OV with Dr. Halford Chessman in 3 months Consider restarting one of the newer biologic medications Please let us know if you need [place] sooner Please contact office for sooner follow up if symptoms do not improve or worsen or seek emergency care

## 2019-04-11 NOTE — Progress Notes (Signed)
Reviewed and agree with assessment/plan.   Canton Yearby, MD [facility] 10/19/2016, 12:24 PM Pager:  [phone]  

## 2019-04-17 ENCOUNTER — Telehealth: Payer: Self-pay | Admitting: Acute Care

## 2019-04-17 NOTE — Telephone Encounter (Signed)
Looked at pt's medication list and I see that pt was given a sample of Breo but we do not have Dulera on pt's medication list. We need to confirm with pt which inhaler she is actually on so we can send Rx to pharmacy for her. We also need to figure out which allergy meds she needs a refill on as well.  Attempted to call pt but unable to reach. Left message for pt to return call.

## 2019-04-18 ENCOUNTER — Other Ambulatory Visit: Payer: Self-pay | Admitting: Family Medicine

## 2019-04-18 DIAGNOSIS — G629 Polyneuropathy, unspecified: Secondary | ICD-10-CM

## 2019-04-18 MED ORDER — BREO ELLIPTA 100-25 MCG/INH IN AEPB: 1.0000 | INHALATION_SPRAY | Freq: Every day | RESPIRATORY_TRACT | 3 refills | Status: DC

## 2019-04-18 MED ORDER — MONTELUKAST SODIUM 10 MG PO TABS: 10.0000 mg | ORAL_TABLET | Freq: Every day | ORAL | 1 refills | Status: DC

## 2019-04-18 NOTE — Telephone Encounter (Signed)
1) Medication(s) Requested (by name): -gabapentin (NEURONTIN) 300 MG capsule   2) Pharmacy of Choice: -[id number] - [place], [address] 3) Special Requests:   Approved medications will be sent to the pharmacy, we will reach out if there is an issue.  Requests made after 3pm may not be addressed until the following business day!  If a patient is unsure of the name of the medication(s) please note and ask patient to call back when they are able to provide all info, do not send to responsible party until all information is available!

## 2019-04-18 NOTE — Telephone Encounter (Signed)
Called patient, made aware we do not have Dulera on her medlist but we do have breo. She states she apologizes [name] is what she meant. She is also requesting a allergy pill but she does not know the name of it. Made her aware per [name] last AVS she told her to continue singulair. Patient confirmed this medication. Refills sent in. Nothing further is needed at this time.

## 2019-04-22 ENCOUNTER — Telehealth: Payer: Self-pay | Admitting: Pulmonary Disease

## 2019-04-22 MED ORDER — GABAPENTIN 300 MG PO CAPS: 300.0000 mg | ORAL_CAPSULE | Freq: Two times a day (BID) | ORAL | 2 refills | Status: DC

## 2019-04-22 NOTE — Telephone Encounter (Signed)
LVMTCB x 1 for patient.  Asked for the patient to call back to clarify when the last form was received, if she needs 1 placard or 2 and if she wanted the form mailed, or will she pick it up.  LOV 01/28/19 with Tammy Parrett, NP ([facility], asthma). She is a patient of Dr. Halford Chessman.Marland Kitchen

## 2019-04-23 ENCOUNTER — Telehealth: Payer: Self-pay | Admitting: Pulmonary Disease

## 2019-04-23 ENCOUNTER — Other Ambulatory Visit: Payer: Self-pay

## 2019-04-23 ENCOUNTER — Encounter: Payer: Self-pay | Admitting: Family Medicine

## 2019-04-23 ENCOUNTER — Ambulatory Visit: Payer: Medicaid Other | Attending: Family Medicine | Admitting: Family Medicine

## 2019-04-23 DIAGNOSIS — M79672 Pain in left foot: Secondary | ICD-10-CM | POA: Diagnosis not present

## 2019-04-23 DIAGNOSIS — B3731 Acute candidiasis of vulva and vagina: Secondary | ICD-10-CM

## 2019-04-23 DIAGNOSIS — B373 Candidiasis of vulva and vagina: Secondary | ICD-10-CM | POA: Diagnosis not present

## 2019-04-23 MED ORDER — FLUCONAZOLE 150 MG PO TABS: 150.0000 mg | ORAL_TABLET | Freq: Once | ORAL | 1 refills | Status: AC

## 2019-04-23 MED ORDER — MELOXICAM 7.5 MG PO TABS: 7.5000 mg | ORAL_TABLET | Freq: Every day | ORAL | 1 refills | Status: DC

## 2019-04-23 NOTE — Progress Notes (Signed)
Virtual Visit via Telephone Note  I connected with Kathleen AgeLouise H Rodriguez, on 04/23/2019 at 3:07 PM by telephone due to the COVID-19 pandemic and verified that I am speaking with the correct person using two identifiers.   Consent: I discussed the limitations, risks, security and privacy concerns of performing an evaluation and management service by telephone and the availability of in person appointments. I also discussed with the patient that there may be a patient responsible charge related to this service. The patient expressed understanding and agreed to proceed.   Location of Patient: Home  Location of Provider: Clinic   Persons participating in Telemedicine visit: Ernestene KielLouise H Rodriguez Alicia Farrington-CMA Dr. Nelwyn SalisburyNewlin-PCP     History of Present Illness: Kathleen Rodriguez is a 53 year old female with a history of asthma, obstructive sleep apnea (on CPAP), bilateral knee osteoarthritis (status post left total knee arthroplasty in 08/2016) seen today for an acute visit. She complains of vaginal itching but no discharge and thinks she has a yeast infection.  Denies urinary symptoms. Her left ankle has been hurting and she endorses pain from the arc of her foot to her ankle which is described as a 7/10 and pain occurs with range of motion of her left ankle.  She has also noticed some edema.  She does have a rash from chronic left lower extremity cellulitis for which she is seeing Dermatology and reports improvement of the rash. Denies trauma to ankle.   Past Medical History:  Diagnosis Date  . Arthritis    "knees, lower back; legs, ankles" (01/27/2016)  . Asthma    followed by Dr. Craige CottaSood  . CHF (congestive heart failure) (HCC) 2016   "when I went into a coma"  . Cocaine abuse (HCC)   . 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 Dyspnea   . GERD (gastroesophageal reflux disease)   . Hypertension    "doctor took me off RX in 2016" (01/27/2016)  . Influenza B April 2014   Complicated by multi-organ  failure  . OSA on CPAP "since " 03/20/2013  . Pneumonia 2016  . Required emergent intubation    asthma exacerbation in 2016  . Tobacco abuse   . Upper airway cough syndrome    Allergies  Allergen Reactions  . Tomato Hives, Itching and Other (See Comments)    ALSO REACTS TO KETCHUP  . Latex Itching and Rash  . Wool Alcohol [Lanolin] Itching    Current Outpatient Medications on File Prior to Visit  Medication Sig Dispense Refill  . albuterol (PROVENTIL HFA;VENTOLIN HFA) 108 (90 Base) MCG/ACT inhaler Inhale 2 puffs into the lungs every 6 (six) hours as needed for wheezing or shortness of breath. 1 Inhaler 6  . albuterol (PROVENTIL) (2.5 MG/3ML) 0.083% nebulizer solution Take 3 mLs (2.5 mg total) by nebulization every 6 (six) hours as needed for wheezing or shortness of breath. 75 mL 2  . aspirin 81 MG EC tablet Take 1 tablet (81 mg total) by mouth daily. 30 tablet 3  . Camphor-Eucalyptus-Menthol (VICKS VAPORUB EX) Apply 1 application topically daily as needed (congestion).    . cloNIDine (CATAPRES) 0.1 MG tablet Take 1 tablet (0.1 mg total) by mouth 2 (two) times daily. 60 tablet 3  . fluticasone (FLONASE) 50 MCG/ACT nasal spray Place 1 spray into both nostrils daily. (Patient taking differently: Place 1 spray into both nostrils daily as needed for allergies. ) 16 g 5  . fluticasone furoate-vilanterol (BREO ELLIPTA) 100-25 MCG/INH AEPB Inhale 1 puff into the lungs  daily. 60 each 3  . gabapentin (NEURONTIN) 300 MG capsule Take 1 capsule (300 mg total) by mouth 2 (two) times daily. 60 capsule 2  . ipratropium (ATROVENT) 0.02 % nebulizer solution Take 2.5 mLs (0.5 mg total) by nebulization 2 (two) times daily as needed for wheezing or shortness of breath. 75 mL 0  . Menthol (HALLS COUGH DROPS MT) Use as directed 1 drop in the mouth or throat 2 (two) times daily as needed (sore throat and cough).    . methocarbamol (ROBAXIN) 500 MG tablet TAKE 1 TABLET (500 MG TOTAL) BY MOUTH EVERY 8 (EIGHT)  HOURS AS NEEDED FOR MUSCLE SPASMS. 60 tablet 1  . Misc. Devices MISC Blood pressure monitor  Dx: Hypertension 1 each 0  . montelukast (SINGULAIR) 10 MG tablet Take 1 tablet (10 mg total) by mouth at bedtime. 90 tablet 1  . pantoprazole (PROTONIX) 40 MG tablet Take 1 tablet (40 mg total) by mouth daily at 12 noon. 30 tablet 3  . PROAIR HFA 108 (90 Base) MCG/ACT inhaler Inhale 2 puffs into the lungs every 6 (six) hours as needed for wheezing or shortness of breath. 1 Inhaler 3  . Spacer/Aero-Holding Chambers (AEROCHAMBER MV) inhaler Use as instructed 1 each 2  . acetaminophen (TYLENOL) 500 MG tablet Take 500 mg by mouth every 6 (six) hours as needed for mild pain.    . benzonatate (TESSALON) 200 MG capsule Take 1 capsule (200 mg total) by mouth 2 (two) times daily as needed for cough. (Patient not taking: Reported on 04/23/2019) 20 capsule 0  . budesonide (PULMICORT) 0.25 MG/2ML nebulizer solution Take 2 mLs (0.25 mg total) by nebulization 2 (two) times daily for 120 doses. 120 mL 1  . levalbuterol (XOPENEX) 1.25 MG/0.5ML nebulizer solution Take 1.25 mg by nebulization 2 (two) times daily as needed for wheezing or shortness of breath. (Patient not taking: Reported on 04/23/2019) 30 each 0  . urea (CARMOL) 20 % cream Apply topically as needed. (Patient not taking: Reported on 04/08/2019) 85 g 1   No current facility-administered medications on file prior to visit.     Observations/Objective: Awake, alert, oriented x3 Not in acute distress  Assessment and Plan: 1. Vaginal candidiasis - fluconazole (DIFLUCAN) 150 MG tablet; Take 1 tablet (150 mg total) by mouth once for 1 dose.  Dispense: 1 tablet; Refill: 1  2. Left foot pain - meloxicam (MOBIC) 7.5 MG tablet; Take 1 tablet (7.5 mg total) by mouth daily.  Dispense: 30 tablet; Refill: 1   Follow Up Instructions: 3 months   I discussed the assessment and treatment plan with the patient. The patient was provided an opportunity to ask questions  and all were answered. The patient agreed with the plan and demonstrated an understanding of the instructions.   The patient was advised to call back or seek an in-person evaluation if the symptoms worsen or if the condition fails to improve as anticipated.     I provided 16 minutes total of non-face-to-face time during this encounter including median intraservice time, reviewing previous notes, labs, imaging, medications, management and patient verbalized understanding.     [name], MD, FAAFP. [facility] and [facility] [place], [place]   04/23/2019, 3:07 PM

## 2019-04-23 NOTE — Telephone Encounter (Signed)
lmtcb x1 pt to get more info.  1 or 2 plaquards, will she pick up, mail it...Marland KitchenMarland Kitchen

## 2019-04-23 NOTE — Progress Notes (Signed)
Patient has been called and DOB has been verified. Patient has been screened and transferred to PCP to start phone visit.  Patient is having pain in her feet. Patient states she may have a yeast infection.

## 2019-04-24 ENCOUNTER — Telehealth: Payer: Self-pay | Admitting: Pulmonary Disease

## 2019-04-24 DIAGNOSIS — L03119 Cellulitis of unspecified part of limb: Secondary | ICD-10-CM | POA: Diagnosis not present

## 2019-04-24 DIAGNOSIS — J9690 Respiratory failure, unspecified, unspecified whether with hypoxia or hypercapnia: Secondary | ICD-10-CM | POA: Diagnosis not present

## 2019-04-24 DIAGNOSIS — J4551 Severe persistent asthma with (acute) exacerbation: Secondary | ICD-10-CM | POA: Diagnosis not present

## 2019-04-24 DIAGNOSIS — R269 Unspecified abnormalities of gait and mobility: Secondary | ICD-10-CM | POA: Diagnosis not present

## 2019-04-24 NOTE — Telephone Encounter (Signed)
There is another encounter open from today 7/1 where pt has called back in regards to handicap placard. Will close this encounter so we can address encounter from today.

## 2019-04-24 NOTE — Telephone Encounter (Signed)
I will sign for one additional . Thanks

## 2019-04-24 NOTE — Telephone Encounter (Signed)
Call returned to patient, made aware NP Sela Hilding has signed the handicap placard but will only sign for one. Voiced understanding. She asked that we mail her the placard. Application placed in mail. Nothing further needed at this time.

## 2019-04-24 NOTE — Telephone Encounter (Signed)
Called and spoke to pt. Pt requesting 2 handicap placards as her car was repossessed with the other placard inside.  Pt last 'seen' by SG as a televisit.   Sarah, please advise if you are ok signing for this. Thanks.

## 2019-04-30 ENCOUNTER — Other Ambulatory Visit: Payer: Self-pay

## 2019-04-30 ENCOUNTER — Encounter: Payer: Self-pay | Admitting: Primary Care

## 2019-04-30 ENCOUNTER — Telehealth: Payer: Self-pay | Admitting: Pulmonary Disease

## 2019-04-30 ENCOUNTER — Ambulatory Visit (INDEPENDENT_AMBULATORY_CARE_PROVIDER_SITE_OTHER): Payer: Medicaid Other | Admitting: Primary Care

## 2019-04-30 DIAGNOSIS — J4541 Moderate persistent asthma with (acute) exacerbation: Secondary | ICD-10-CM | POA: Diagnosis not present

## 2019-04-30 MED ORDER — BREO ELLIPTA 100-25 MCG/INH IN AEPB: 1.0000 | INHALATION_SPRAY | Freq: Every day | RESPIRATORY_TRACT | 3 refills | Status: DC

## 2019-04-30 MED ORDER — MONTELUKAST SODIUM 10 MG PO TABS: 10.0000 mg | ORAL_TABLET | Freq: Every day | ORAL | 1 refills | Status: DC

## 2019-04-30 MED ORDER — PREDNISONE 10 MG PO TABS: ORAL_TABLET | ORAL | 0 refills | Status: DC

## 2019-04-30 MED ORDER — ALBUTEROL SULFATE (2.5 MG/3ML) 0.083% IN NEBU: 2.5000 mg | INHALATION_SOLUTION | Freq: Four times a day (QID) | RESPIRATORY_TRACT | 2 refills | Status: DC | PRN

## 2019-04-30 MED ORDER — ALBUTEROL SULFATE HFA 108 (90 BASE) MCG/ACT IN AERS: 2.0000 | INHALATION_SPRAY | Freq: Four times a day (QID) | RESPIRATORY_TRACT | 3 refills | Status: DC | PRN

## 2019-04-30 NOTE — Telephone Encounter (Signed)
No answer, left message

## 2019-04-30 NOTE — Telephone Encounter (Signed)
Call returned to patient, made aware of BM recommendations. She states she will try and get her neighbor to leave the phone with her to get her visit done. Appt made. I also made her aware that she will need to be seen in the office in the near future per NP due to frequent exacerbations. She states she just has to know days in advance. Nothing further needed at this time. Will route message to NP that she has appt with as FYI. Nothing further needed at this time.

## 2019-04-30 NOTE — Telephone Encounter (Signed)
Patient has completed multiple telephonic visits in the past.  She needs to at least be set up to complete a telephonic visit with our office.  I would have a low threshold of ordering outpatient PEC COVID-19 testing.  I also believe the patient eventually will need an in office visit as she continues to have recurrent exacerbations based off the symptoms she is reporting today and the fact that she was recently treated telephonically by SG in June/2020 with prednisone as well as doxycycline.  Please contact the patient back and let her know that she will need to schedule a time to complete a telephonic visit with an APP.  And we also will need the patient to start planning coordination for travel to complete outpatient PEC testing as well as a in office visit.Please schedule with an APP.

## 2019-04-30 NOTE — Telephone Encounter (Signed)
Call returned to patient, she states she is having chest congestion and a runny nose that started yesterday. She reports having increased SOB. Denies chest tightness or pressure. She reports she thinks the heat and lack of A/C is causing her SOB. She states she does not have A/C and is having to use a regular floor fan. She states she thinks the fan is causing her congestion however it is so hot she needs to use it. Denies fever. Denies cough. Denies having any other symptoms. She reports she is using her inhaler daily. When I offered an appt patient reports she does not have a car or phone. She states she was using her neighbors phone and they are willing to come back over once we call back with recommendations.   BM please advise.

## 2019-04-30 NOTE — Patient Instructions (Signed)
Spoke with the today regarding acute asthma exacerbation We are sending in prednisone taper for you to complete  Recommendations: Take Breo 1 puff daily Use albuterol rescue inhaler OR nebulizer every 4-6 hours as needed for breakthrough shortness of breath or wheezing  Orders: Advised to get tested for COVID re: shortness of breath, cough  Rx: Breo refill  Albuterol HFA  Prednisone taper   Follow-up; 1 - 2 weeks with Barbaraann Barthel or Derl Barrow NP  Needs to bring all medications in hand to this visit

## 2019-04-30 NOTE — Progress Notes (Signed)
Virtual Visit via Telephone Note  I connected with Kathleen Rodriguez on 04/30/19 at 11:30 AM EDT by telephone and verified that I am speaking with the correct person using two identifiers.  Location: Patient: Home Provider: Office   I discussed the limitations, risks, security and privacy concerns of performing an evaluation and management service by telephone and the availability of in person appointments. I also discussed with the patient that there may be a patient responsible charge related to this service. The patient expressed understanding and agreed to proceed.   History of Present Illness: 53 year old female, former smoker. PMH significant for severe persistent asthma with eosinophilic phenotype, OSA non currently using CPAP, polysubstance abuse. Patient of Dr. Halford Chessman, last had telephone visit with Elie Confer, NP on 6/15 for asthma exacerbation treated with doxycycline and prednisone taper. She was not on any maintenance medication and was started on Breo. She has been treated for multiple exacerbations since March 2020.   04/30/2019 Patient called today with complaints of increased shortness of breath, chest congestion and runny nose x 1 day. Cough is productive with clear mucus. Denies fever. Associated a lot of her symptoms to stress and not having central air and using fans throughout the house. States that she is using Breo and albuterol rescue inahler mulitple times throught the day. Needs refills of both medications. During phone call ambulance arrived for her husband who has having chest pain so she couldn't talk for long. Advised she be tested for COVID and return to office in 1 week for follow-up with ALL medications in hand.   Observations/Objective:  - No significant shortness of breath, wheezing or cough noted during phone conversation  Significant testing: CT chest4/15/14 >>no PE, no acute findings Labs 02/11/13 >> ANA negative, Anti-GBM Ab < 1, ANCA negative PFT 05/15/13>>FEV1  1.90 (76%), FEV1% 72, TLC 4.23 (81%), DLCO 89%, no BD PFT 03/03/17 >> FEV1 1.57 (64%), FEV1% 64, TLC 4.47 (84%), DLCO 685, + BD RAST 07/28/17 >> mild reaction to trees, ragweed; IgE 446 HRCT chest 03/12/18 >> mild BTX  Sleep tests: PSG 04/19/13 >> AHI 16.4, SpO2 low 84%.  Echo9/16/18 >> EF 60 to 65%  Assessment and Plan:  Acute asthma exacerbation - Needs COVID testing d/t recurrent exacerbations - No signs of bacterial infection, holding off on further abx - RX prednisone taper - Refilling Breo and Albuterol hfa/nebulizer  Follow Up Instructions:  - Recommend fu with SG or BW in 1-2 weeks after COVID testing with ALL medications in hand  I discussed the assessment and treatment plan with the patient. The patient was provided an opportunity to ask questions and all were answered. The patient agreed with the plan and demonstrated an understanding of the instructions.   The patient was advised to call back or seek an in-person evaluation if the symptoms worsen or if the condition fails to improve as anticipated.  I provided 15 minutes of non-face-to-face time during this encounter.   Martyn Ehrich, NP

## 2019-05-25 DIAGNOSIS — J9690 Respiratory failure, unspecified, unspecified whether with hypoxia or hypercapnia: Secondary | ICD-10-CM | POA: Diagnosis not present

## 2019-05-25 DIAGNOSIS — J4551 Severe persistent asthma with (acute) exacerbation: Secondary | ICD-10-CM | POA: Diagnosis not present

## 2019-05-25 DIAGNOSIS — L03119 Cellulitis of unspecified part of limb: Secondary | ICD-10-CM | POA: Diagnosis not present

## 2019-05-25 DIAGNOSIS — R269 Unspecified abnormalities of gait and mobility: Secondary | ICD-10-CM | POA: Diagnosis not present

## 2019-05-27 ENCOUNTER — Telehealth: Payer: Self-pay | Admitting: Pulmonary Disease

## 2019-05-27 NOTE — Telephone Encounter (Signed)
Attempted to call pt but unable to reach. Left message for pt to return call. 

## 2019-05-28 NOTE — Telephone Encounter (Signed)
LMTCB x2 for pt 

## 2019-05-29 MED ORDER — BREO ELLIPTA 100-25 MCG/INH IN AEPB: 1.0000 | INHALATION_SPRAY | Freq: Every day | RESPIRATORY_TRACT | 5 refills | Status: DC

## 2019-05-29 MED ORDER — ALBUTEROL SULFATE HFA 108 (90 BASE) MCG/ACT IN AERS: 2.0000 | INHALATION_SPRAY | Freq: Four times a day (QID) | RESPIRATORY_TRACT | 5 refills | Status: DC | PRN

## 2019-05-29 NOTE — Telephone Encounter (Signed)
Spoke with pt. States that she no longer needs samples of Mucinex. Pt does need refills of Breo and Albuterol sent her pharmacy. These have been sent in. Nothing further was needed.

## 2019-05-30 ENCOUNTER — Telehealth: Payer: Self-pay | Admitting: Family Medicine

## 2019-05-30 MED ORDER — FLUCONAZOLE 150 MG PO TABS: 150.0000 mg | ORAL_TABLET | Freq: Once | ORAL | 0 refills | Status: AC

## 2019-05-30 NOTE — Telephone Encounter (Signed)
Will route to PCP for review. 

## 2019-05-30 NOTE — Telephone Encounter (Signed)
Patient called wanting to know if she could be prescribed something for a yeast infection. Please follow up

## 2019-05-31 ENCOUNTER — Telehealth: Payer: Self-pay | Admitting: Pulmonary Disease

## 2019-05-31 NOTE — Telephone Encounter (Signed)
Patient was called and informed of medication being sent to pharmacy. 

## 2019-05-31 NOTE — Telephone Encounter (Signed)
Medication name and strength: Breo 100 Provider: Dr. Halford Chessman [facility] [address] Patient insurance ID: [id number] T   PA was started over the phone with [facility] [phone]. PA has been approved for 1 year, starting today.   Pharmacy is aware of approval. Nothing further needed at time of call.

## 2019-06-04 NOTE — Progress Notes (Signed)
Reviewed and agree with assessment/plan.   Delaynee Alred, MD [place] [facility] 10/19/2016, 12:24 PM Pager:  [phone]  

## 2019-06-12 NOTE — Progress Notes (Signed)
Reviewed and agree with assessment/plan.   Keiva Dina, MD [place] [facility] 10/19/2016, 12:24 PM Pager:  [phone]  

## 2019-06-13 NOTE — Progress Notes (Signed)
Patient did not pick up AVS or samples. AVS states a follow up with [facility] in 3 months (06/2019) will mail AVS to patient's house. Samples returned to shelf (Breo 100)

## 2019-06-13 NOTE — Progress Notes (Signed)
Reviewed and agree with assessment/plan.   Carla Rashad, MD [place] [facility] 10/19/2016, 12:24 PM Pager:  [phone]  

## 2019-06-14 NOTE — Progress Notes (Signed)
Reviewed and agree with assessment/plan.   Kaulana Brindle, MD [place] [facility] 10/19/2016, 12:24 PM Pager:  [phone]  

## 2019-06-17 ENCOUNTER — Ambulatory Visit: Payer: Medicaid Other | Admitting: Pulmonary Disease

## 2019-06-17 ENCOUNTER — Other Ambulatory Visit: Payer: Self-pay

## 2019-06-17 ENCOUNTER — Encounter: Payer: Self-pay | Admitting: Pulmonary Disease

## 2019-06-17 ENCOUNTER — Telehealth: Payer: Self-pay | Admitting: Pulmonary Disease

## 2019-06-17 VITALS — BP 126/66 | HR 96 | Ht 66.0 in | Wt 279.8 lb

## 2019-06-17 DIAGNOSIS — J8283 Eosinophilic asthma: Secondary | ICD-10-CM

## 2019-06-17 DIAGNOSIS — Z9989 Dependence on other enabling machines and devices: Secondary | ICD-10-CM | POA: Diagnosis not present

## 2019-06-17 DIAGNOSIS — J301 Allergic rhinitis due to pollen: Secondary | ICD-10-CM

## 2019-06-17 DIAGNOSIS — G4733 Obstructive sleep apnea (adult) (pediatric): Secondary | ICD-10-CM | POA: Diagnosis not present

## 2019-06-17 DIAGNOSIS — J4551 Severe persistent asthma with (acute) exacerbation: Secondary | ICD-10-CM | POA: Diagnosis not present

## 2019-06-17 DIAGNOSIS — J82 Pulmonary eosinophilia, not elsewhere classified: Secondary | ICD-10-CM

## 2019-06-17 DIAGNOSIS — Z23 Encounter for immunization: Secondary | ICD-10-CM | POA: Diagnosis not present

## 2019-06-17 NOTE — Telephone Encounter (Signed)
Received Kathleen Rodriguez enrollment forms from [facility], [place]. Forms have been faxed to [facility]. Will await summary of benefits.

## 2019-06-17 NOTE — Progress Notes (Signed)
[place] [facility], [facility] Sleep [facility]  Chief Complaint  Patient presents with  . Follow-up    follow-up asthma.  c/o increased wheezing Xcouple of days, believes this is weather related.    Constitutional:  BP 126/66 (BP Location: Left Arm, Cuff Size: Normal)   Pulse 96   Ht 5\' 6"  (1.676 m)   Wt 279 lb 12.8 oz (126.9 kg)   SpO2 97%   BMI 45.16 kg/m   Past Medical History:  Cocaine use, PNA 2016, Influenza B 2014, HTN, GERD  Brief Summary:  Kathleen Rodriguez is a 53 y.o. 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  She had another exacerbation about 1 month ago when she was last on prednisone.  She has been hospitalized twice in the past year for asthma exacerbation.  Rapid drug screen from October 2019 was negative for cocaine.  She has intermittent cough, chest congestion, and wheeze.  Got breo refilled last week.  Not having fever, sore throat, reflux, diarrhea, skin rash, gland swelling, leg swelling, or hemoptysis.  Eosinophil count from CBC 11/07/18 was 300.  Uses CPAP at night w/o difficulty.  Physical Exam:   Appearance - well kempt   ENMT - no sinus tenderness, no nasal discharge, no oral exudate  Neck - no masses, trachea midline, no thyromegaly, no elevation in JVP  Respiratory - normal appearance of chest wall, normal respiratory effort w/o accessory muscle use, no dullness on percussion, no wheezing or rales  CV - s1s2 regular rate and rhythm, no murmurs, no peripheral edema, radial pulses symmetric  GI - soft, non tender  Lymph - no adenopathy noted in neck and axillary areas  MSK - normal gait  Ext - no cyanosis, clubbing, or joint inflammation noted  Skin - no rashes, lesions, or ulcers  Neuro - normal strength, oriented x 3  Psych - normal mood and affect   Discussion:  She has severe persistent asthma.  She is not able to be controlled with nebulized steroids and leukotriene inhibitors.   She has tried multiple inhalers w/o improvement.  She has frequent exacerbations requiring systemic steroid use and hospitalizations.  She had allergic reaction to [employer].  She had previous lab test showing elevated eosinophil count.  Assessment/Plan:   Severe persistent asthma with eosinophilic phenotype. Mild bronchiectasis on CT chest. History of cocaine use. - continue breo, singulair - advised her to use albuterol more often when she has symptoms - will see if she can get approval for fasenra - influenza and pneumovax injections performed today  Upper airway cough syndrome with post nasal drip. - continue flonase, singulair - advised her to try using OTC antihistamine  Obstructive sleep apnea. - she reports benefit from CPAP and compliance with therapy  Dyspnea on exertion. - she is not able to walk more than 200 and as such qualifies for handicap parking   Patient Instructions  Influenza and Pneumovax injections today  Will start process to get approval for Fasenra  Follow up in 2 months    Chesley Mires, MD [place] Pager: [phone] 06/17/2019, 10:02 AM  Flow Sheet     Pulmonary tests:  Labs 02/11/13 >> ANA negative, Anti-GBM Ab < 1, ANCA negative PFT 05/15/13>>FEV1 1.90 (76%), FEV1% 72, TLC 4.23 (81%), DLCO 89%, no BD PFT 03/03/17 >> FEV1 1.57 (64%), FEV1% 64, TLC 4.47 (84%), DLCO 685, + BD RAST 07/28/17 >> mild reaction to trees, ragweed; IgE 446  Chest imaging:  CT chest4/15/14 >>  no PE, no acute findings HRCT chest 03/12/18 >> mild BTX  Sleep tests:  PSG 04/19/13 >> AHI 16.4, SpO2 low 84%.  Cardiac tests:  Echo 12/25/18 >> EF 55 to 60%  Events:  04/13/18 Allergic reaction to xolair  Medications:   Allergies as of 06/17/2019      Reactions   Tomato Hives, Itching, Other (See Comments)   ALSO REACTS TO KETCHUP   Latex Itching, Rash   Wool Alcohol [lanolin] Itching      Medication List       Accurate as of June 17, 2019 10:02 AM. If you have any questions, ask your nurse or doctor.        STOP taking these medications   benzonatate 200 MG capsule Commonly known as: TESSALON Stopped by: Chesley Mires, MD   predniSONE 10 MG tablet Commonly known as: DELTASONE Stopped by: Chesley Mires, MD     TAKE these medications   acetaminophen 500 MG tablet Commonly known as: TYLENOL Take 500 mg by mouth every 6 (six) hours as needed for mild pain.   AeroChamber MV inhaler Use as instructed   albuterol (2.5 MG/3ML) 0.083% nebulizer solution Commonly known as: PROVENTIL Take 3 mLs (2.5 mg total) by nebulization every 6 (six) hours as needed for wheezing or shortness of breath.   albuterol 108 (90 Base) MCG/ACT inhaler Commonly known as: VENTOLIN HFA Inhale 2 puffs into the lungs every 6 (six) hours as needed for wheezing or shortness of breath.   aspirin 81 MG EC tablet Take 1 tablet (81 mg total) by mouth daily.   Breo Ellipta 100-25 MCG/INH Aepb Generic drug: fluticasone furoate-vilanterol Inhale 1 puff into the lungs daily.   cloNIDine 0.1 MG tablet Commonly known as: CATAPRES Take 1 tablet (0.1 mg total) by mouth 2 (two) times daily.   fluticasone 50 MCG/ACT nasal spray Commonly known as: FLONASE Place 1 spray into both nostrils daily. What changed:   when to take this  reasons to take this   gabapentin 300 MG capsule Commonly known as: NEURONTIN Take 1 capsule (300 mg total) by mouth 2 (two) times daily.   HALLS COUGH DROPS MT Use as directed 1 drop in the mouth or throat 2 (two) times daily as needed (sore throat and cough).   ipratropium 0.02 % nebulizer solution Commonly known as: ATROVENT Take 2.5 mLs (0.5 mg total) by nebulization 2 (two) times daily as needed for wheezing or shortness of breath.   meloxicam 7.5 MG tablet Commonly known as: MOBIC Take 1 tablet (7.5 mg total) by mouth daily.   methocarbamol 500 MG tablet Commonly known as: ROBAXIN TAKE 1 TABLET (500  MG TOTAL) BY MOUTH EVERY 8 (EIGHT) HOURS AS NEEDED FOR MUSCLE SPASMS.   Misc. Devices Misc Blood pressure monitor  Dx: Hypertension   montelukast 10 MG tablet Commonly known as: SINGULAIR Take 1 tablet (10 mg total) by mouth at bedtime.   pantoprazole 40 MG tablet Commonly known as: PROTONIX Take 1 tablet (40 mg total) by mouth daily at 12 noon.   urea 20 % cream Commonly known as: CARMOL Apply topically as needed.   VICKS VAPORUB EX Apply 1 application topically daily as needed (congestion).       Past Surgical History:  She  has a past surgical history that includes Cesarean section (2006); Tubal ligation (2006); Laceration repair (Right, ~ 1997); Breast surgery (Right, 1980); and Total knee arthroplasty (Left, 09/01/2016).  Family History:  Her family history includes HIV/AIDS in her  father; Hypertension in her mother.  Social History:  She  reports that she quit smoking about 6 years ago. Her smoking use included cigarettes. She started smoking about 27 years ago. She has a 5.25 pack-year smoking history. She has never used smokeless tobacco. She reports that she does not drink alcohol or use drugs.

## 2019-06-17 NOTE — Patient Instructions (Signed)
Influenza and Pneumovax injections today  Will start process to get approval for Fasenra  Follow up in 2 months

## 2019-06-17 NOTE — Addendum Note (Signed)
Addended by: Len Blalock on: 06/17/2019 10:08 AM   Modules accepted: Orders

## 2019-06-18 NOTE — Telephone Encounter (Signed)
Fax received from [facility].  Patient's insurance information faxed to requested number. Confirmation received.

## 2019-06-19 NOTE — Telephone Encounter (Signed)
Benefit Investigation results received via fax, with [place] [employer] PA application. [place], [phone], spoke with Patsy, initiated PA.  Medication name and strength: [name] 30mg  Provider: Halford Chessman [facility] Patient insurance ID: [id number] T Phone: [phone] Fax: [phone]  Was the PA started on CMM?  No PA started with [place] [employer] Timeframe for approval/denial: 24-48 hours  PA # [id number]

## 2019-06-25 NOTE — Telephone Encounter (Signed)
Patient states Medicaid denied the Fasenra injection.  Patient phone number is [phone].

## 2019-06-25 NOTE — Telephone Encounter (Signed)
Called [facility] to check on Fasenra PA status. Per patient Kathleen Bue PA has been denied. Spoke with [name].  Was placed on hold for 22 minutes and call was cut off.  Will try again at a later time.

## 2019-06-27 ENCOUNTER — Telehealth: Payer: Self-pay | Admitting: Pulmonary Disease

## 2019-06-27 MED ORDER — PREDNISONE 10 MG PO TABS: ORAL_TABLET | ORAL | 0 refills | Status: DC

## 2019-06-27 NOTE — Telephone Encounter (Signed)
Called [employer] to follow up on this matter. Was placed on a long hold then the line got cut off. Attempted to try to call back but received a busy signal x2. Will try back.

## 2019-06-27 NOTE — Telephone Encounter (Signed)
Would they, by chance, have happened to mention what the criteria are to qualify for fasenra.  If not, is there a way to determine this?

## 2019-06-27 NOTE — Telephone Encounter (Signed)
Please send script for prednisone 10 mg pill >> 4 pills daily for 2 days, 3 pills daily for 2 days, 1 pill daily for 2 days.

## 2019-06-27 NOTE — Telephone Encounter (Signed)
Called and spoke to pt. Informed her of the recs per VS. Rx sent to preferred pharmacy. Pt verbalized understanding and denied any further questions or concerns at this time.   

## 2019-06-27 NOTE — Telephone Encounter (Signed)
We received a letter from the [facility]. States that the pt was denied for [name] due to her not meeting the criteria. In order to appeal this decision our office will have to do a [employer].  VS - please advise. Thanks.

## 2019-06-27 NOTE — Telephone Encounter (Signed)
Called and spoke to pt. Pt c/o increase in SOB, prod cough (unsure of mucus color), chest tightness, wheezing - all started this morning. Pt states her albuterol hfa and neb aren't helping much. Pt denies f/c/s, swelling. Pt states she has been on the Breo 100 for 2 weeks and she hasn't noticed an improvement in her breathing.   (pts [name] is still in process, PA was denied, see phone note from 06/17/2019 for more info)  Dr. Halford Chessman please advise. Thanks.

## 2019-07-03 ENCOUNTER — Telehealth: Payer: Self-pay | Admitting: Pulmonary Disease

## 2019-07-03 NOTE — Telephone Encounter (Signed)
Per the denial letter we received from [employer]:  Beneficiary must have a pre bronchodilator FEV1 below 80% in adults. [name] must be used as add on maintenance treatment, must not be used for the treatment of other eosinophilic conditions, must not be used for the relief of acute bronchospasm or status asthmaticus, and must not be used as dual therapy with other monoclonal antibody treatments. Based on the information submitted by the provider, the beneficiary did not meet the required clinical criteria.

## 2019-07-03 NOTE — Progress Notes (Addendum)
Virtual Visit via Telephone Note  I connected with [name] on 07/04/19 at  9:00 AM EDT by telephone and verified that I am speaking with the correct person using two identifiers.  Location: Patient: Home Provider: [facility] - [address], [place], [place], [place] [address]   I discussed the limitations, risks, security and privacy concerns of performing an evaluation and management service by telephone and the availability of in person appointments. I also discussed with the patient that there may be a patient responsible charge related to this service. The patient expressed understanding and agreed to proceed.  Patient consented to consult via telephone: Yes People present and their role in pt care: Pt    History of Present Illness:  53 y.o. 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  PMH: GERD, morbid obesity, hypertension, chest pain, polysubstance abuse - cocaine Smoking History: Former Smoker.  Quit 2014.  5.25 pack years. Maintenance: Breo 100  Pt of Dr. Halford Chessman   Chief complaint: Short of breath, concerns over medication use   53 year old female former smoker followed in our office for COPD, asthma, peripheral eosinophilia.  Patient contacted our office yesterday reporting increased shortness of breath as well as concerns that her Breo Ellipta 100 inhaler may not be working well for her.  Patient was contacted via telephone today as patient has transportation issues during the weekends except for Mondays.  Patient was audibly wheezing and struggling with her breath on the phone.  After patient stopped and took a few deep breaths she was able to calm down.  Patient continued to audibly wheeze.  She denies any sort of productive cough or mucus production.  Patient reports adherence to Brio Ellipta 101 puff daily as well as using her nebulizer 2 times daily.  She needs a refill on her rescue inhaler.  Patient also  continues to use her Atrovent nebs 2 times daily.  Patient also feels that her symptoms worsen with physical activity.  She is currently on a prednisone taper that was prescribed on 06/27/2019 but patient did not start it until 3 days ago.  Observations/Objective:  Audibly wheezing on the phone.  Patient struggling with inspiratory and expiratory breaths.    Pulmonary tests:  Labs 02/11/13 >> ANA negative, Anti-GBM Ab < 1, ANCA negative PFT 05/15/13>>FEV1 1.90 (76%), FEV1% 72, TLC 4.23 (81%), DLCO 89%, no BD PFT 03/03/17 >> FEV1 1.57 (64%), FEV1% 64, TLC 4.47 (84%), DLCO 685, + BD RAST 07/28/17 >> mild reaction to trees, ragweed; IgE 446  Chest imaging:  CT chest4/15/14 >>no PE, no acute findings HRCT chest 03/12/18 >> mild BTX  Sleep tests:  PSG 04/19/13 >> AHI 16.4, SpO2 low 84%.  Cardiac tests:  Echo 12/25/18 >> EF 55 to 60%  Events:  04/13/18 Allergic reaction to xolair  Assessment and Plan:  Asthma Plan: Stop Breo Ellipta 100, start Breo Ellipta 200 Continue Atrovent and albuterol nebs every 6 hours while on prednisone taper Prednisone taper today Present to our office to pick up Breo Ellipta 200 We will follow-up with our biologic coordinator regarding patient's fasnera application that was denied by Medicaid Previous reaction to Xolair 2-week follow-up in office   Asthma exacerbation Plan: Prednisone taper today Albuterol and Atrovent nebs every 6 hours Increase Breo Ellipta to Breo Ellipta 200, sample provided today Close follow-up in our office in 2 weeks If symptoms worsen patient needs to present to the emergency room  COPD mixed type ([place])  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 Limited smoking history of about 5 pack years Elevated IgE and peripheral eosinophilia on blood work  Plan: Start Breo Ellipta 200 Prednisone taper today Close follow-up with our office in 2 weeks  Oral thrush Patient reporting sore throat and white coating on tongue Patient does  not always rinse mouth out after inhaler use  Plan: Nystatin rinse today Encourage patient to present to primary care office for further evaluation  Medication management Reviewed medication use with patient today  Plan: Start Breo Ellipta 200, stop Breo Ellipta 623 [place] application was denied by Medicaid, we will appeal Previous reaction to Xolair Patient may be a good candidate for clinical pharmacy referral in the future if we continue to have issues with inhaler adherence or confusion regarding medications   Follow Up Instructions:  Return in about 2 weeks (around 07/18/2019), or if symptoms worsen or fail to improve, for Follow up with Dr. Halford Chessman, Follow up with Wyn Quaker FNP-C.   I discussed the assessment and treatment plan with the patient. The patient was provided an opportunity to ask questions and all were answered. The patient agreed with the plan and demonstrated an understanding of the instructions.   The patient was advised to call back or seek an in-person evaluation if the symptoms worsen or if the condition fails to improve as anticipated.  I provided 23 minutes of non-face-to-face time during this encounter.   Lauraine Rinne, NP

## 2019-07-03 NOTE — Telephone Encounter (Signed)
Called and spoke with pt. Pt states her Breo inhaler is "not working for her." She reports taking it "off and on" depending on if she is taking her nebulizer. Specifically, she reports taking 2 doses of Breo in the morning and 1-2 a night. She states she has been rinsing her mouth out, but feel as if she has not been rinsing well enough. She further notes she has thrush - describes white spots on her tongue and in the back of her throat in addition to throat irritation. She also states the prednisone she is currently on is making her itch and possibly causing her a yeast infection. She is inquiring if she can receive diflucan for this. She states she also needs a refill on her Proventil rescue inhaler. Finally, she reports having shortness of breath and chest tightness.   Since VS is not currently in the office, I am routing this message to [facility], who saw the patient last. Eustaquio Maize, please advise with your recommendations. Thank you.

## 2019-07-03 NOTE — Telephone Encounter (Signed)
This needs a televisit. She should only be using 1 puff breo daily

## 2019-07-03 NOTE — Telephone Encounter (Signed)
Noted.   Kathleen Rodriguez

## 2019-07-03 NOTE — Telephone Encounter (Signed)
Called and spoke with pt regarding Beth's recommendations. Pt states she actually is taking the Breo once daily and that she's taking the albuterol rescue inhaler 2 in the morning and 1-2 at night. I suggested we still schedule a telephone visit to discuss all of her needs. Pt expressed understanding and agreed to this suggestion. I was going to schedule her with [facility] tomorrow; however, her schedule is full. I scheduled pt for a televisit with Aaron Edelman 07/04/2019 at 9:00 AM. Pt states she would like to be called at [phone]. Routing to [facility] as an Pharmacist, hospital. Nothing further needed at this time.

## 2019-07-04 ENCOUNTER — Telehealth: Payer: Self-pay | Admitting: Family Medicine

## 2019-07-04 ENCOUNTER — Encounter: Payer: Self-pay | Admitting: Pulmonary Disease

## 2019-07-04 ENCOUNTER — Other Ambulatory Visit: Payer: Self-pay

## 2019-07-04 ENCOUNTER — Ambulatory Visit (INDEPENDENT_AMBULATORY_CARE_PROVIDER_SITE_OTHER): Payer: Medicaid Other | Admitting: Pulmonary Disease

## 2019-07-04 DIAGNOSIS — J4551 Severe persistent asthma with (acute) exacerbation: Secondary | ICD-10-CM

## 2019-07-04 DIAGNOSIS — Z79899 Other long term (current) drug therapy: Secondary | ICD-10-CM

## 2019-07-04 DIAGNOSIS — J449 Chronic obstructive pulmonary disease, unspecified: Secondary | ICD-10-CM | POA: Diagnosis not present

## 2019-07-04 DIAGNOSIS — B370 Candidal stomatitis: Secondary | ICD-10-CM

## 2019-07-04 MED ORDER — ALBUTEROL SULFATE HFA 108 (90 BASE) MCG/ACT IN AERS: 2.0000 | INHALATION_SPRAY | Freq: Four times a day (QID) | RESPIRATORY_TRACT | 5 refills | Status: DC | PRN

## 2019-07-04 MED ORDER — PREDNISONE 10 MG PO TABS: ORAL_TABLET | ORAL | 0 refills | Status: AC

## 2019-07-04 MED ORDER — BREO ELLIPTA 200-25 MCG/INH IN AEPB: 1.0000 | INHALATION_SPRAY | Freq: Every day | RESPIRATORY_TRACT | 0 refills | Status: DC

## 2019-07-04 MED ORDER — NYSTATIN 100000 UNIT/ML MT SUSP: 5.0000 mL | Freq: Four times a day (QID) | OROMUCOSAL | 0 refills | Status: DC

## 2019-07-04 NOTE — Assessment & Plan Note (Signed)
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

## 2019-07-04 NOTE — Telephone Encounter (Signed)
Patient is needing a medication due to her inhaler giving her oral thrush.

## 2019-07-04 NOTE — Patient Instructions (Addendum)
You were seen today by Lauraine Rinne, NP  for:   1. COPD mixed type ([place])  Start Breo Ellipta 200 >>> Take 1 puff daily in the morning right when you wake up >>>Rinse your mouth out after use >>>This is a daily maintenance inhaler, NOT a rescue inhaler >>>Contact our office if you are having difficulties affording or obtaining this medication >>>It is important for you to be able to take this daily and not miss any doses   Continue to use albuterol and Atrovent nebulized meds every 6 hours while on prednisone taper  Only use your albuterol as a rescue medication to be used if you can't catch your breath by resting or doing a relaxed purse lip breathing pattern.  - The less you use it, the better it will work when you need it. - Ok to use up to 2 puffs  every 4 hours if you must but call for immediate appointment if use goes up over your usual need - Don't leave home without it !!  (think of it like the spare tire for your car)    - predniSONE (DELTASONE) 10 MG tablet; Take 4 tablets (40 mg total) by mouth daily with breakfast for 3 days, THEN 3 tablets (30 mg total) daily with breakfast for 3 days, THEN 2 tablets (20 mg total) daily with breakfast for 3 days, THEN 1 tablet (10 mg total) daily with breakfast for 3 days.  Dispense: 30 tablet; Refill: 0  2. Severe persistent asthma with acute exacerbation  Start Breo Ellipta 200 >>> Take 1 puff daily in the morning right when you wake up >>>Rinse your mouth out after use >>>This is a daily maintenance inhaler, NOT a rescue inhaler >>>Contact our office if you are having difficulties affording or obtaining this medication >>>It is important for you to be able to take this daily and not miss any doses   Continue use albuterol and Atrovent nebulized meds every 6 hours  - predniSONE (DELTASONE) 10 MG tablet; Take 4 tablets (40 mg total) by mouth daily with breakfast for 3 days, THEN 3 tablets (30 mg total) daily with breakfast for 3 days,  THEN 2 tablets (20 mg total) daily with breakfast for 3 days, THEN 1 tablet (10 mg total) daily with breakfast for 3 days.  Dispense: 30 tablet; Refill: 0  We will also work on [employer] which Medicaid is currently denied.  This will help work with your asthma to help limit your steroid use as well as hopefully decrease your asthma exacerbations.  If your breathing gets worse you need to present to an emergency room for further evaluation.   3. Oral thrush  - nystatin (MYCOSTATIN) 100000 UNIT/ML suspension; Take 5 mLs (500,000 Units total) by mouth 4 (four) times daily.  Dispense: 60 mL; Refill: 0   You can contact your primary care provider to be evaluated for vaginal symptoms.  If they agree to start you on antifungals based off their assessment then you can stop taking the nystatin oral rinse  We recommend today:  No orders of the defined types were placed in this encounter.  No orders of the defined types were placed in this encounter.  Meds ordered this encounter  Medications  . predniSONE (DELTASONE) 10 MG tablet    Sig: Take 4 tablets (40 mg total) by mouth daily with breakfast for 3 days, THEN 3 tablets (30 mg total) daily with breakfast for 3 days, THEN 2 tablets (20 mg total)  daily with breakfast for 3 days, THEN 1 tablet (10 mg total) daily with breakfast for 3 days.    Dispense:  30 tablet    Refill:  0  . nystatin (MYCOSTATIN) 100000 UNIT/ML suspension    Sig: Take 5 mLs (500,000 Units total) by mouth 4 (four) times daily.    Dispense:  60 mL    Refill:  0  . albuterol (VENTOLIN HFA) 108 (90 Base) MCG/ACT inhaler    Sig: Inhale 2 puffs into the lungs every 6 (six) hours as needed for wheezing or shortness of breath.    Dispense:  6.7 g    Refill:  5    RX needs to be for Ventolin HFA inhaler    Follow Up:    Return in about 2 weeks (around 07/18/2019), or if symptoms worsen or fail to improve, for Follow up with Dr. Craige Cotta, Follow up with Elisha Headland  FNP-C.   Please do your part to reduce the spread of COVID-19:      Reduce your risk of any infection  and COVID19 by using the similar precautions used for avoiding the common cold or flu:  Marland Kitchen Wash your hands often with soap and warm water for at least 20 seconds.  If soap and water are not readily available, use an alcohol-based hand sanitizer with at least 60% alcohol.  . If coughing or sneezing, cover your mouth and nose by coughing or sneezing into the elbow areas of your shirt or coat, into a tissue or into your sleeve (not your hands). Drinda Butts A MASK when in public  . Avoid shaking hands with others and consider head nods or verbal greetings only. . Avoid touching your eyes, nose, or mouth with unwashed hands.  . Avoid close contact with people who are sick. . Avoid places or events with large numbers of people in one location, like concerts or sporting events. . If you have some symptoms but not all symptoms, continue to monitor at home and seek medical attention if your symptoms worsen. . If you are having a medical emergency, call 911.   ADDITIONAL HEALTHCARE OPTIONS FOR PATIENTS  [facility] / e-Visit: [email or web address]         [facility]: [phone]  [facility]: [phone]                   [facility]: [phone]     It is flu season:   >>> Best ways to protect herself from the flu: Receive the yearly flu vaccine, practice good hand hygiene washing with soap and also using hand sanitizer when available, eat a nutritious meals, get adequate rest, hydrate appropriately   Please contact the office if your symptoms worsen or you have concerns that you are not improving.   Thank you for choosing [facility] for your healthcare, and for allowing [place] to partner with you on your healthcare journey. I am thankful to be able to provide care to you today.   Elisha Headland FNP-C     Asthma Attack  Acute bronchospasm caused by asthma is also referred to as an asthma attack. Bronchospasm means that the air passages become narrowed or "tight," which limits the amount of oxygen that can get into the lungs. The narrowing is caused by inflammation and tightening of the muscles in the air tubes (bronchi) in the lungs. Excessive mucus is also produced, which narrows the airways more. This can cause trouble  breathing, coughing, and loud breathing (wheezing). What are the causes? Possible triggers include:  Animal dander from the skin, hair, or feathers of animals.  Dust mites contained in house dust.  Cockroaches.  Pollen from trees or grass.  Mold.  Cigarette or tobacco smoke.  Air pollutants such as dust, household cleaners, hair sprays, aerosol sprays, paint fumes, strong chemicals, or strong odors.  Cold air or weather changes. Cold air may trigger inflammation. Winds increase molds and pollens in the air.  Strong emotions such as crying or laughing hard.  Stress.  Certain medicines, such as aspirin or beta-blockers.  Sulfites in foods and drinks, such as dried fruits and wine.  Infections or inflammatory conditions, such as a flu, a cold, pneumonia, or inflammation of the nasal membranes (rhinitis).  Gastroesophageal reflux disease (GERD). GERD is a condition in which stomach acid backs up into your esophagus, which can irritate nearby airway structures.  Exercise or activity that requires a lot of energy. What are the signs or symptoms? Symptoms of this condition include:  Wheezing. This may sound like whistling while breathing. This may be more noticeable at night.  Excessive coughing, particularly at night.  Chest tightness or pain.  Shortness of breath.  Feeling like you cannot get enough air no matter how hard you try (air hunger). How is this diagnosed? This condition may be diagnosed based on:  Your medical  history.  Your symptoms.  A physical exam.  Tests to check for other causes of your symptoms or other conditions that may have triggered your asthma attack. These tests may include: ? Chest X-ray. ? Blood tests. ? Specialized tests to assess lung function, such as breathing into a device that measures how much air you inhale and exhale (spirometry). How is this treated? The goal of treatment is to open the airways in your lungs and reduce inflammation. Most asthma attacks are treated with medicines that you inhale through a hand-held inhaler (metered dose inhaler, MDI) or a device that turns liquid medicine into a mist that you inhale (nebulizer). Medicines may include:  Quick relief or rescue medicines that relax the muscles of the bronchi. These medicines include bronchodilators, such as albuterol.  Controller medicines, such as inhaled corticosteroids. These are long-acting medicines that are used for daily asthma maintenance. If you have a moderate or severe asthma attack, you may be treated with steroid medicines by mouth or through an IV injection at the hospital. Steroid medicines reduce inflammation in your lungs. Depending on the severity of your attack, you may need oxygen therapy to help you breathe. If your asthma attack was caused by a bacterial infection, such as pneumonia, you will be given antibiotic medicines. Follow these instructions at home: Medicines  Take over-the-counter and prescription medicines only as told by your health care provider. Keep your medicines up-to-date and available.  If you are more than [redacted] weeks pregnant and you are prescribed any new medicines, tell your obstetrician about those medicines.  If you were prescribed an antibiotic medicine, take it as told by your health care provider. Do not stop taking the antibiotic even if you start to feel better. Avoiding triggers   Keep track of things that trigger your asthma attacks or cause you to have  breathing problems, and avoid exposure to these triggers.  Do not use any products that contain nicotine or tobacco, such as cigarettes and e-cigarettes. If you need help quitting, ask your health care provider.  Avoid secondhand smoke.  Avoid strong  smells, such as perfumes, aerosols, and cleaning solvents.  When pollen or air pollution is bad, keep windows closed and use an air conditioner or go to places with air conditioning. Asthma action plan  Work with your health care provider to make a written plan for managing and treating your asthma attacks (asthma action plan). This plan should include: ? A list of your asthma triggers and how to avoid them. ? Information about when your medicines should be taken and when their dosage should be changed. ? Instructions about using a device called a peak flow meter to monitor your condition. A peak flow meter measures how well your lungs are working and measures how severe your asthma is at a given time. Your "personal best" is the highest peak flow rate you can reach when you feel good and have no asthma symptoms. General instructions  Avoid excessive exercise or activity until your asthma attack resolves. Ask your health care provider what activities are safe for you and when you can return to your normal activities.  Stay up to date on all vaccinations recommended by your health care provider, such as flu and pneumonia vaccines.  Drink enough fluid to keep your urine clear or pale yellow. Staying hydrated helps keep mucus in your lungs thin so it can be coughed up easily.  If you drink caffeine, do so in moderation.  Do not use alcohol until you have recovered.  Keep all follow-up visits as told by your health care provider. This is important. Asthma requires careful medical care, and you and your health care provider can work together to reduce the likelihood of future attacks. Contact a health care provider if:  Your peak flow reading is  still at 50-79% of your personal best after you have followed your action plan for 1 hour. This is in the yellow zone, which means "caution."  You need to use a reliever medicine more than 2-3 times a week.  Your medicines are causing side effects, such as: ? Rash. ? Itching. ? Swelling. ? Trouble breathing.  Your symptoms do not improve after 48 hours.  You cough up mucus (sputum) that is thicker than usual.  You have a fever.  You need to use your medicines much more frequently than normal. Get help right away if:  Your peak flow reading is less than 50% of your personal best. This is in the red zone, which means "danger."  You have severe trouble breathing.  You develop chest pain or discomfort.  Your medicines no longer seem to be helping.  You vomit.  You cannot eat or drink without vomiting.  You are coughing up yellow, green, brown, or bloody mucus.  You have a fever and your symptoms suddenly get worse.  You have trouble swallowing.  You feel very tired, and breathing becomes tiring. Summary  Acute bronchospasm caused by asthma is also referred to as an asthma attack.  Bronchospasm is caused by narrowing or tightness in air passages, which causes shortness of breath, coughing, and loud breathing (wheezing).  Many things can trigger an asthma attack, such as allergens, weather changes, exercise, smoke, and other fumes.  Treatment for an asthma attack may include inhaled rescue medicines for immediate relief, as well as the use of maintenance therapy.  Get help right away if you have worsening shortness of breath, chest pain, or fever, or if your home medicines are no longer helping with your symptoms. This information is not intended to replace advice given to  you by your health care provider. Make sure you discuss any questions you have with your health care provider. Document Released: 01/25/2007 Document Revised: 01/29/2019 Document Reviewed:  11/11/2016 Elsevier Patient Education  2020 [employer]

## 2019-07-04 NOTE — Assessment & Plan Note (Addendum)
Reviewed medication use with patient today  Plan: Start Breo Ellipta 200, stop Breo Ellipta 292 Fasenra application was denied by Medicaid, we will appeal Previous reaction to Xolair Patient may be a good candidate for clinical pharmacy referral in the future if we continue to have issues with inhaler adherence or confusion regarding medications

## 2019-07-04 NOTE — Telephone Encounter (Signed)
Patient states she was given prednisone by her pulmonologist and was given abrio and believes her throat has a thrust.  Patient states she also needs a pill for yeast infection.    Please follow up.

## 2019-07-04 NOTE — Telephone Encounter (Signed)
I have resubmitted all of the pt's medical records and information to [employer]. Will await appeal decision.

## 2019-07-04 NOTE — Assessment & Plan Note (Signed)
Plan: Prednisone taper today Albuterol and Atrovent nebs every 6 hours Increase Breo Ellipta to Breo Ellipta 200, sample provided today Close follow-up in our office in 2 weeks If symptoms worsen patient needs to present to the emergency room

## 2019-07-04 NOTE — Assessment & Plan Note (Addendum)
Plan: Stop Breo Ellipta 100, start Breo Ellipta 200 Continue Atrovent and albuterol nebs every 6 hours while on prednisone taper Prednisone taper today Present to our office to pick up Breo Ellipta 200 We will follow-up with our biologic coordinator regarding patient's fasnera application that was denied by Medicaid Previous reaction to Xolair 2-week follow-up in office

## 2019-07-04 NOTE — Telephone Encounter (Signed)
So her pre-bronchodilator FEV1 from 05/15/13 was 75%, and from 03/03/17 was 56%.  This would be used for treating severe persistent asthma and not any other eosinophilic condition.  This would be used as long term, steroid sparing maintenance therapy and would be added into her other maintenance regimen with consists of breo and singulair.  She is not on any other monoclonal antibody therapy that I am aware of.    Can you please check with [facility] about what information they reviewed and correct there misunderstanding.

## 2019-07-04 NOTE — Addendum Note (Signed)
Addended by: Mathis Bud on: 07/04/2019 09:30 AM   Modules accepted: Orders

## 2019-07-04 NOTE — Assessment & Plan Note (Signed)
Patient reporting sore throat and white coating on tongue Patient does not always rinse mouth out after inhaler use  Plan: Nystatin rinse today Encourage patient to present to primary care office for further evaluation

## 2019-07-04 NOTE — Addendum Note (Signed)
Addended by: Mathis Bud on: 07/04/2019 09:34 AM   Modules accepted: Orders

## 2019-07-05 ENCOUNTER — Telehealth: Payer: Self-pay | Admitting: Pulmonary Disease

## 2019-07-05 MED ORDER — FLUCONAZOLE 150 MG PO TABS: 150.0000 mg | ORAL_TABLET | Freq: Once | ORAL | 0 refills | Status: AC

## 2019-07-05 NOTE — Telephone Encounter (Signed)
Medication has been sent to her Pharmacy

## 2019-07-05 NOTE — Telephone Encounter (Signed)
Patient was called and informed of medication being sent to pharmacy. 

## 2019-07-05 NOTE — Telephone Encounter (Signed)
ATC pt, no answer. Left message for pt to call back.  

## 2019-07-09 NOTE — Telephone Encounter (Signed)
lmtcb for pt.  

## 2019-07-10 NOTE — Telephone Encounter (Signed)
ATC patient unable to reach , left message to call back x3 will close this message per triage protocol.

## 2019-07-18 ENCOUNTER — Telehealth: Payer: Self-pay | Admitting: Pulmonary Disease

## 2019-07-18 NOTE — Telephone Encounter (Signed)
Called and spoke with Patient.  Patient stated she was notified [name] was approved and called [facility] to give consent for shipment. Fax was received today stating Patient needed to contact [place]. [facility] contacted for delivery.  [name] Order: 30mg  #1 prefilled syringe Ordered date: 07/18/19 Expected date of arrival: 07/19/19 Ordered by: [id number]: [facility]

## 2019-07-19 ENCOUNTER — Telehealth: Payer: Self-pay | Admitting: Family Medicine

## 2019-07-19 MED ORDER — FLUCONAZOLE 150 MG PO TABS: 150.0000 mg | ORAL_TABLET | Freq: Once | ORAL | 1 refills | Status: AC

## 2019-07-19 MED ORDER — EPINEPHRINE 0.3 MG/0.3ML IJ SOAJ: 0.3000 mg | Freq: Once | INTRAMUSCULAR | 5 refills | Status: DC

## 2019-07-19 NOTE — Telephone Encounter (Signed)
Patient called stating that the provider she see's at [facility] has placed the patient on prednisone and needs a one time pill for a yeast infection. Please follow up.

## 2019-07-19 NOTE — Telephone Encounter (Signed)
Fasenra Shipment Received:  30mg  #1 prefilled syringe Medication arrival date: 07/19/19 Lot #: [id number] Exp date: 07/24/2020 Received by: Elliot Dally

## 2019-07-19 NOTE — Telephone Encounter (Signed)
Epipen sent to requested pharmacy, [place]  Patient aware to bring Epipen to each injection, first injection is 2 hour wait for after injection assessment, and 2nd injection will be 20 minute wait for after injection assessment. Understanding stated. Nothing further at this time.

## 2019-07-19 NOTE — Telephone Encounter (Signed)
Will route to PCP for review. 

## 2019-07-19 NOTE — Addendum Note (Signed)
Addended by: Elton Sin on: 07/19/2019 04:26 PM   Modules accepted: Orders

## 2019-07-19 NOTE — Telephone Encounter (Signed)
I have sent a rx for Diflucan to her pharmacy.

## 2019-07-22 ENCOUNTER — Ambulatory Visit: Payer: Medicaid Other

## 2019-07-22 ENCOUNTER — Ambulatory Visit: Payer: Medicaid Other | Admitting: Pulmonary Disease

## 2019-07-22 NOTE — Telephone Encounter (Signed)
Please see telephone encounter from 07/18/2019.

## 2019-07-26 ENCOUNTER — Telehealth: Payer: Self-pay | Admitting: Pulmonary Disease

## 2019-07-26 NOTE — Telephone Encounter (Signed)
Yes technically this can be done.  I do not believe this is ideal with the best thing for the patient chronically moving forward.  That is also not how we have prescribed it for her.  I would recommend a high dose ICS lama such as Breo Ellipta 200 or Dulera 200 as previously stated. Breo Ellipta is not intended to be taken twice daily.  That is why I would like for the patient to trial Dulera 200 and start taking that.  Unfortunately the patient's insurance will not cover the higher dose Breo Ellipta apparently  Wyn Quaker, FNP

## 2019-07-26 NOTE — Telephone Encounter (Signed)
Called and spoke w/ pt regarding the information below and Brian's recommendations. Pt states her insurance will cover Breo 100 and that she's been taking it twice daily. She inquires if she is able to continue receiving Breo 100 and taking it twice daily. I let her know I would get a message back to [place] for his advisory.   Aaron Edelman, please advise. Thank you.

## 2019-07-26 NOTE — Telephone Encounter (Signed)
Patient needs to remain on a high dose ICS.  She could be switched to [facility] 200 if she is willing to try it.  This would be a different inhaler so she would require inhaler teaching.  Dulera 200 that would be very comparable to the Breo Ellipta 200.  Dulera 200 >>> 2 puffs in the morning right when you wake up, rinse out your mouth after use, 12 hours later 2 puffs, rinse after use >>> Take this daily, no matter what >>> This is not a rescue inhaler   Okay to place prescription.  Wyn Quaker, FNP.

## 2019-07-26 NOTE — Telephone Encounter (Signed)
Received Breo 200 PA request from [place]. Upon beginning the authorization, CoverMyMeds stated the following: 32% likelihood this medication will be covered by pt's insurance if prior authorization is attempted.   CoverMyMeds offered the following alternatives based on pt's formulary:  Dulera 200-5 Mcg/Actuation HFAA tier-3  Combivent Respimat tier-11  Spiriva With HandiHaler tier-12 Stiolto Respimat tier-14  Aaron Edelman, please advise if you would like to proceed with PA or if you would like to switch pt to an alternative inhaler. Thank you.

## 2019-07-26 NOTE — Telephone Encounter (Signed)
ATC pt, line went to voicemail, LMTCB x1.  

## 2019-07-29 ENCOUNTER — Telehealth: Payer: Self-pay | Admitting: Pulmonary Disease

## 2019-07-29 DIAGNOSIS — J4551 Severe persistent asthma with (acute) exacerbation: Secondary | ICD-10-CM

## 2019-07-29 MED ORDER — DULERA 200-5 MCG/ACT IN AERO: 2.0000 | INHALATION_SPRAY | Freq: Two times a day (BID) | RESPIRATORY_TRACT | 2 refills | Status: DC

## 2019-07-29 NOTE — Telephone Encounter (Signed)
Called and spoke with pt regarding Brian's recommendations below. Pt verbalized understanding and agreed to these measures. Order for [facility] 200 has been placed to pt's preferred pharmacy. Nothing further needed at this time.

## 2019-07-29 NOTE — Telephone Encounter (Signed)
LMTCB x1 for pt.  

## 2019-07-30 NOTE — Telephone Encounter (Signed)
Spoke with the pt and notified of response per Kathleen Rodriguez  She states that she does not wish to wait until her appt with Dr Halford Chessman 08/19/19 and wants msg sent to [facility] now  Please advise if you want to sign handicap placard form for pt, thanks

## 2019-07-30 NOTE — Telephone Encounter (Signed)
I can sign this when I am back in the office next time.

## 2019-07-30 NOTE — Telephone Encounter (Signed)
This can be addressed at next office visit.  I have never discussed with the patient why she would need a handicap placard.  She has 2 options she can need to discuss at next office visit with Dr. Halford Chessman, or we can route the message to Dr Halford Chessman and see if he is comfortable with signing this for her.   Wyn Quaker FNP

## 2019-07-30 NOTE — Telephone Encounter (Signed)
Called and spoke with patient. She believes she already has refills at pharmacy for [facility] and Albuterol.   Patient wants to know if a new handicap placard can be filled out and mailed to her house.   Aaron Edelman will your please advise on handicap placard

## 2019-07-31 ENCOUNTER — Ambulatory Visit: Payer: Medicaid Other | Admitting: Family Medicine

## 2019-07-31 NOTE — Telephone Encounter (Signed)
Spoke with patient.  Let her now Dr. Halford Chessman would sign next time he was in office.  She voiced understanding, Nothing further needed at this time.

## 2019-08-19 ENCOUNTER — Ambulatory Visit: Payer: Medicaid Other | Admitting: Pulmonary Disease

## 2019-08-19 ENCOUNTER — Ambulatory Visit: Payer: Medicaid Other

## 2019-08-23 ENCOUNTER — Telehealth: Payer: Self-pay | Admitting: General Surgery

## 2019-08-23 NOTE — Telephone Encounter (Signed)
CPAP download information only updates until December 2018. I called the patient and she stated the machine was dropped a while ago and was replaced. Patient could not find a memory card for the machine. Advised the patient the memory card would be needed for the appointment on 08/26/19 with Dr. Halford Chessman and will try the agency to see if they can get more detail.  I called [employer] CPAP compliance [phone] and the rep was only able to compliance until December 2018. She called the patient and the patient told her she could not find the machine.  Rep called because they would need the serial number from the new machine in order to link into airview. However, since the patient could not find the machine, they were not able to do so.

## 2019-08-26 ENCOUNTER — Other Ambulatory Visit: Payer: Medicaid Other | Admitting: Family Medicine

## 2019-08-26 ENCOUNTER — Ambulatory Visit: Payer: Medicaid Other | Admitting: Pulmonary Disease

## 2019-08-26 ENCOUNTER — Telehealth: Payer: Self-pay | Admitting: Family Medicine

## 2019-08-26 ENCOUNTER — Telehealth: Payer: Self-pay | Admitting: Pulmonary Disease

## 2019-08-26 MED ORDER — FLUCONAZOLE 150 MG PO TABS: 150.0000 mg | ORAL_TABLET | Freq: Once | ORAL | 0 refills | Status: AC

## 2019-08-26 NOTE — Telephone Encounter (Signed)
Will route to PCP for review. 

## 2019-08-26 NOTE — Telephone Encounter (Signed)
Done

## 2019-08-26 NOTE — Telephone Encounter (Signed)
Patient called stating that she was put back on prednisone by her breathing provider and would like to be prescribed a yeast infection pill. Please follow up.

## 2019-08-26 NOTE — Telephone Encounter (Signed)
ATC patient unable to reach LM to call back office (x1)  Patient given a taper 07/04/19 by BPM, needed 2 week follow up , didn't have .

## 2019-08-28 ENCOUNTER — Other Ambulatory Visit: Payer: Self-pay | Admitting: Pulmonary Disease

## 2019-08-28 DIAGNOSIS — J4551 Severe persistent asthma with (acute) exacerbation: Secondary | ICD-10-CM

## 2019-08-28 MED ORDER — PREDNISONE 10 MG PO TABS: ORAL_TABLET | ORAL | 0 refills | Status: DC

## 2019-08-28 NOTE — Telephone Encounter (Signed)
Will send in prednisone taper, needs to keep apt with Dr. Halford Chessman on 11/9

## 2019-08-28 NOTE — Telephone Encounter (Signed)
Agree, patient needs to keep follow-up with Dr. Vennie Homans FNP

## 2019-08-28 NOTE — Telephone Encounter (Signed)
I called and spoke with the patient and made her aware that a Prednisone Taper has been sent to to her pharmacy and to keep appointment with Dr. Halford Chessman on 11/9. She verbalized understanding and nothing further is needed at this time.
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
## 2019-09-01 ENCOUNTER — Encounter (HOSPITAL_COMMUNITY): Payer: Self-pay | Admitting: Emergency Medicine

## 2019-09-01 ENCOUNTER — Emergency Department (HOSPITAL_COMMUNITY)
Admission: EM | Admit: 2019-09-01 | Discharge: 2019-09-01 | Disposition: A | Discharge status: Home / Self Care | Payer: Medicaid Other | Attending: Emergency Medicine | Admitting: Emergency Medicine

## 2019-09-01 ENCOUNTER — Other Ambulatory Visit: Payer: Self-pay

## 2019-09-01 ENCOUNTER — Emergency Department (HOSPITAL_COMMUNITY): Payer: Medicaid Other

## 2019-09-01 DIAGNOSIS — R062 Wheezing: Secondary | ICD-10-CM | POA: Diagnosis not present

## 2019-09-01 DIAGNOSIS — M545 Low back pain, unspecified: Secondary | ICD-10-CM

## 2019-09-01 DIAGNOSIS — Z79899 Other long term (current) drug therapy: Secondary | ICD-10-CM | POA: Diagnosis not present

## 2019-09-01 DIAGNOSIS — Y999 Unspecified external cause status: Secondary | ICD-10-CM | POA: Insufficient documentation

## 2019-09-01 DIAGNOSIS — M25561 Pain in right knee: Secondary | ICD-10-CM | POA: Insufficient documentation

## 2019-09-01 DIAGNOSIS — Y93I9 Activity, other involving external motion: Secondary | ICD-10-CM | POA: Diagnosis not present

## 2019-09-01 DIAGNOSIS — Z7982 Long term (current) use of aspirin: Secondary | ICD-10-CM | POA: Insufficient documentation

## 2019-09-01 DIAGNOSIS — I10 Essential (primary) hypertension: Secondary | ICD-10-CM | POA: Diagnosis not present

## 2019-09-01 DIAGNOSIS — Z87891 Personal history of nicotine dependence: Secondary | ICD-10-CM | POA: Diagnosis not present

## 2019-09-01 DIAGNOSIS — M546 Pain in thoracic spine: Secondary | ICD-10-CM | POA: Insufficient documentation

## 2019-09-01 DIAGNOSIS — Y92410 Unspecified street and highway as the place of occurrence of the external cause: Secondary | ICD-10-CM | POA: Insufficient documentation

## 2019-09-01 DIAGNOSIS — Z91040 Latex allergy status: Secondary | ICD-10-CM | POA: Diagnosis not present

## 2019-09-01 DIAGNOSIS — R0602 Shortness of breath: Secondary | ICD-10-CM | POA: Diagnosis not present

## 2019-09-01 DIAGNOSIS — J449 Chronic obstructive pulmonary disease, unspecified: Secondary | ICD-10-CM | POA: Diagnosis not present

## 2019-09-01 MED ORDER — PREDNISONE 20 MG PO TABS
60.0000 mg | ORAL_TABLET | Freq: Once | ORAL | Status: AC
  Administered 2019-09-01: 60 mg via ORAL
  Filled 2019-09-01: qty 3

## 2019-09-01 MED ORDER — TRAMADOL HCL 50 MG PO TABS: 50.0000 mg | ORAL_TABLET | Freq: Four times a day (QID) | ORAL | 0 refills | Status: DC | PRN

## 2019-09-01 MED ORDER — ALBUTEROL SULFATE HFA 108 (90 BASE) MCG/ACT IN AERS
6.0000 | INHALATION_SPRAY | Freq: Once | RESPIRATORY_TRACT | Status: AC
  Administered 2019-09-01: 6 via RESPIRATORY_TRACT
  Filled 2019-09-01: qty 6.7

## 2019-09-01 MED ORDER — PREDNISONE 20 MG PO TABS
20.0000 mg | ORAL_TABLET | Freq: Once | ORAL | Status: AC
  Administered 2019-09-01: 20 mg via ORAL
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
## 2019-09-01 NOTE — ED Notes (Signed)
Returned the Proventil Inhaler, and 40 mg prednisone. Pt taking them at home.

## 2019-09-01 NOTE — ED Notes (Signed)
Patient transported to X-ray 
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
## 2019-09-02 ENCOUNTER — Telehealth: Payer: Self-pay | Admitting: Pulmonary Disease

## 2019-09-02 ENCOUNTER — Ambulatory Visit: Payer: Medicaid Other | Admitting: Pulmonary Disease

## 2019-09-02 NOTE — Telephone Encounter (Signed)
Call returned to patient, confirmed DOB, requesting Handicap placard be renewed. Aware she has an appt 11/16. She would like a call back once this has been addressed. I made her aware I would get the message to VS.   VS please advise if willing to re-new handicap placard application. Form given to nurse.

## 2019-09-03 NOTE — Telephone Encounter (Signed)
LMTCB  Per VS patient can obtain at [facility].

## 2019-09-03 NOTE — Telephone Encounter (Signed)
Form completed.

## 2019-09-04 NOTE — Telephone Encounter (Signed)
Left message for patient to call back  

## 2019-09-05 NOTE — Telephone Encounter (Signed)
Spoke with patient. She is aware that she can obtain the placard during her visit with VS on 11/16.  Verbalized understanding. Nothing further needed at time of call.

## 2019-09-06 ENCOUNTER — Telehealth: Payer: Self-pay

## 2019-09-06 NOTE — Telephone Encounter (Signed)
Noted  

## 2019-09-06 NOTE — Telephone Encounter (Signed)
Okay noted.  This is good news.  We will route to Dr. Halford Chessman as Juluis Rainier as he is seeing the patient next week.Wyn Quaker, FNP

## 2019-09-06 NOTE — Telephone Encounter (Signed)
I have completed a prior auth via telephone with [employer] for [facility] [id number]. [id number]) has been approved 09/06/19-08/31/20.

## 2019-09-09 ENCOUNTER — Encounter: Payer: Self-pay | Admitting: Pulmonary Disease

## 2019-09-09 ENCOUNTER — Other Ambulatory Visit: Payer: Self-pay

## 2019-09-09 ENCOUNTER — Ambulatory Visit: Payer: Medicaid Other | Admitting: Pulmonary Disease

## 2019-09-09 ENCOUNTER — Other Ambulatory Visit: Payer: Self-pay | Admitting: Pulmonary Disease

## 2019-09-09 VITALS — BP 122/82 | HR 90 | Temp 97.0°F | Ht 65.16 in | Wt 280.2 lb

## 2019-09-09 DIAGNOSIS — G4733 Obstructive sleep apnea (adult) (pediatric): Secondary | ICD-10-CM | POA: Diagnosis not present

## 2019-09-09 DIAGNOSIS — J301 Allergic rhinitis due to pollen: Secondary | ICD-10-CM

## 2019-09-09 DIAGNOSIS — J4551 Severe persistent asthma with (acute) exacerbation: Secondary | ICD-10-CM

## 2019-09-09 DIAGNOSIS — Z9989 Dependence on other enabling machines and devices: Secondary | ICD-10-CM

## 2019-09-09 MED ORDER — LORATADINE 10 MG PO TABS: 10.0000 mg | ORAL_TABLET | Freq: Every day | ORAL | 11 refills | Status: DC

## 2019-09-09 MED ORDER — PREDNISONE 5 MG PO TABS: ORAL_TABLET | ORAL | 0 refills | Status: AC

## 2019-09-09 MED ORDER — EPINEPHRINE 0.3 MG/0.3ML IJ SOAJ: 0.3000 mg | Freq: Once | INTRAMUSCULAR | 11 refills | Status: DC

## 2019-09-09 NOTE — Patient Instructions (Addendum)
Prednisone 5 mg pill >> 4 pills daily for 3 days, 2 pills daily for 3 days, 1 pill daily for 3 days, 1/2 pill daily for 8 days  Fasenra injection set up for next week  Follow up in 6 weeks

## 2019-09-09 NOTE — Progress Notes (Signed)
[facility]  Chief Complaint  Patient presents with  . Follow-up    increased wheezing    Constitutional:  BP 122/82 (BP Location: Right Arm, Cuff Size: Large)   Pulse 90   Temp (!) 97 F (36.1 C) (Temporal)   Ht 5' 5.16" (1.655 m)   Wt 280 lb 3.2 oz (127.1 kg)   SpO2 96%   BMI 46.40 kg/m   Past Medical History:  Cocaine use, PNA 2016, Influenza B 2014, HTN, GERD  Brief Summary:  Kathleen Rodriguez is a 53 y.o. female former smoker with allergic asthma, post infection related bronchiectasis, and obstructive sleep apnea.  She is set up for fasenra first injection next week.  She is having more cough and wheeze over past few days.  Not having sinus congestion, sore throat or fever.  Had to change from breo to [place] due to insurance coverage.  Uses CPAP nightly.  Physical Exam:   Appearance - well kempt   ENMT - no sinus tenderness, no nasal discharge, no oral exudate  Neck - no masses, trachea midline, no thyromegaly, no elevation in JVP  Respiratory - normal appearance of chest wall, normal respiratory effort w/o accessory muscle use, no dullness on percussion, b/l expiratory wheezing  CV - s1s2 regular rate and rhythm, no murmurs, no peripheral edema, radial pulses symmetric  GI - soft, non tender  Lymph - no adenopathy noted in neck and axillary areas  MSK - normal gait  Ext - no cyanosis, clubbing, or joint inflammation noted  Skin - no rashes, lesions, or ulcers  Neuro - normal strength, oriented x 3  Psych - normal mood and affect    Discussion:  She has severe persistent asthma.  She is not able to be controlled with nebulized steroids and leukotriene inhibitors.  She has tried multiple inhalers w/o improvement.  She has frequent exacerbations requiring systemic steroid use and hospitalizations.  She had allergic reaction to [employer].  She had previous lab test showing elevated eosinophil count.  Assessment/Plan:    Severe persistent asthma with eosinophilic phenotype. Mild bronchiectasis on CT chest. History of cocaine use. - she has recurrent exacerbation; will give longer course of prednisone - continue dulera, singulair - prn albuterol - fasenra to start next week  Upper airway cough syndrome with post nasal drip. - continue flonase, singulair, claritin  Obstructive sleep apnea. - she reports benefit from CPAP - she will drop off her SD card next week  Dyspnea on exertion. - she is not able to walk more than 200 and as such qualifies for handicap parking - handicap parking form completed   Patient Instructions  Prednisone 5 mg pill >> 4 pills daily for 3 days, 2 pills daily for 3 days, 1 pill daily for 3 days, 1/2 pill daily for 8 days  Fasenra injection set up for next week  Follow up in 6 weeks    Chesley Mires, MD [place] Pager: [phone] 09/09/2019, 10:53 AM  Flow Sheet     Pulmonary tests:  Labs 02/11/13 >> ANA negative, Anti-GBM Ab < 1, ANCA negative PFT 05/15/13>>FEV1 1.90 (76%), FEV1% 72, TLC 4.23 (81%), DLCO 89%, no BD PFT 03/03/17 >> FEV1 1.57 (64%), FEV1% 64, TLC 4.47 (84%), DLCO 685, + BD RAST 07/28/17 >> mild reaction to trees, ragweed; IgE 446  Chest imaging:  CT chest4/15/14 >>no PE, no acute findings HRCT chest 03/12/18 >> mild BTX  Sleep tests:  PSG 04/19/13 >> AHI  16.4, SpO2 low 84%.  Cardiac tests:  Echo 12/25/18 >> EF 55 to 60%  Events:  04/13/18 Allergic reaction to xolair  Medications:   Allergies as of 09/09/2019      Reactions   Tomato Hives, Itching, Other (See Comments)   ALSO REACTS TO KETCHUP   Latex Itching, Rash   Wool Alcohol [lanolin] Itching      Medication List       Accurate as of September 09, 2019 10:53 AM. If you have any questions, ask your nurse or doctor.        STOP taking these medications   Breo Ellipta 200-25 MCG/INH Aepb Generic drug: fluticasone furoate-vilanterol Stopped  by: Chesley Mires, MD   nystatin 100000 UNIT/ML suspension Commonly known as: MYCOSTATIN Stopped by: Chesley Mires, MD     TAKE these medications   acetaminophen 500 MG tablet Commonly known as: TYLENOL Take 500 mg by mouth every 6 (six) hours as needed for mild pain.   AeroChamber MV inhaler Use as instructed   albuterol (2.5 MG/3ML) 0.083% nebulizer solution Commonly known as: PROVENTIL Take 3 mLs (2.5 mg total) by nebulization every 6 (six) hours as needed for wheezing or shortness of breath.   albuterol 108 (90 Base) MCG/ACT inhaler Commonly known as: VENTOLIN HFA Inhale 2 puffs into the lungs every 6 (six) hours as needed for wheezing or shortness of breath.   aspirin 81 MG EC tablet Take 1 tablet (81 mg total) by mouth daily.   cloNIDine 0.1 MG tablet Commonly known as: CATAPRES Take 1 tablet (0.1 mg total) by mouth 2 (two) times daily.   Dulera 200-5 MCG/ACT Aero Generic drug: mometasone-formoterol Inhale 2 puffs into the lungs 2 (two) times daily.   EPINEPHrine 0.3 mg/0.3 mL Soaj injection Commonly known as: EPI-PEN Inject 0.3 mLs (0.3 mg total) into the muscle once for 1 dose. Started by: Chesley Mires, MD   fluticasone 50 MCG/ACT nasal spray Commonly known as: FLONASE Place 1 spray into both nostrils daily. What changed:   when to take this  reasons to take this   gabapentin 300 MG capsule Commonly known as: NEURONTIN Take 1 capsule (300 mg total) by mouth 2 (two) times daily.   HALLS COUGH DROPS MT Use as directed 1 drop in the mouth or throat 2 (two) times daily as needed (sore throat and cough).   ipratropium 0.02 % nebulizer solution Commonly known as: ATROVENT Take 2.5 mLs (0.5 mg total) by nebulization 2 (two) times daily as needed for wheezing or shortness of breath.   loratadine 10 MG tablet Commonly known as: CLARITIN Take 1 tablet (10 mg total) by mouth daily. Started by: Chesley Mires, MD   meloxicam 7.5 MG tablet Commonly known as: MOBIC  Take 1 tablet (7.5 mg total) by mouth daily.   methocarbamol 500 MG tablet Commonly known as: ROBAXIN TAKE 1 TABLET (500 MG TOTAL) BY MOUTH EVERY 8 (EIGHT) HOURS AS NEEDED FOR MUSCLE SPASMS.   Misc. Devices Misc Blood pressure monitor  Dx: Hypertension   montelukast 10 MG tablet Commonly known as: SINGULAIR Take 1 tablet (10 mg total) by mouth at bedtime.   pantoprazole 40 MG tablet Commonly known as: PROTONIX Take 1 tablet (40 mg total) by mouth daily at 12 noon.   predniSONE 5 MG tablet Commonly known as: DELTASONE Take 4 tablets (20 mg total) by mouth daily with breakfast for 3 days, THEN 2 tablets (10 mg total) daily with breakfast for 3 days, THEN 1 tablet (5 mg  total) daily with breakfast for 3 days, THEN 0.5 tablets (2.5 mg total) daily with breakfast for 8 days. Start taking on: September 09, 2019 What changed:   medication strength  See the new instructions. Changed by: Chesley Mires, MD   traMADol 50 MG tablet Commonly known as: ULTRAM Take 1 tablet (50 mg total) by mouth every 6 (six) hours as needed.   urea 20 % cream Commonly known as: CARMOL Apply topically as needed.   VICKS VAPORUB EX Apply 1 application topically daily as needed (congestion).       Past Surgical History:  She  has a past surgical history that includes Cesarean section (2006); Tubal ligation (2006); Laceration repair (Right, ~ 1997); Breast surgery (Right, 1980); and Total knee arthroplasty (Left, 09/01/2016).  Family History:  Her family history includes HIV/AIDS in her father; Hypertension in her mother.  Social History:  She  reports that she quit smoking about 6 years ago. Her smoking use included cigarettes. She started smoking about 27 years ago. She has a 5.25 pack-year smoking history. She has never used smokeless tobacco. She reports that she does not drink alcohol or use drugs.

## 2019-09-16 ENCOUNTER — Ambulatory Visit: Payer: Medicaid Other

## 2019-09-27 ENCOUNTER — Telehealth: Payer: Self-pay | Admitting: Pulmonary Disease

## 2019-09-27 NOTE — Telephone Encounter (Signed)
Called and spoke with Patient to go over Covid questions.  Reminded Patient to bring Epipen to injections.  Patient stated she had not received  Epipen, because [facility] has told her they were waiting on insurance approval. Epipen was ordered 09/09/19 to [place].   Called [facility] and was told her Epipen had been ready and waiting on pick up.   Called and spoke with Patient. Patient aware Epipen was ready for pick up and to contact office if she had any issues or concerns. Nothing further at this time.

## 2019-09-30 ENCOUNTER — Ambulatory Visit: Payer: Medicaid Other

## 2019-10-03 ENCOUNTER — Ambulatory Visit: Payer: Medicaid Other

## 2019-10-07 ENCOUNTER — Other Ambulatory Visit: Payer: Self-pay | Admitting: Adult Health

## 2019-10-08 ENCOUNTER — Telehealth: Payer: Self-pay | Admitting: Pulmonary Disease

## 2019-10-08 DIAGNOSIS — J4551 Severe persistent asthma with (acute) exacerbation: Secondary | ICD-10-CM

## 2019-10-08 NOTE — Telephone Encounter (Signed)
Called and spoke with pt. Pt is requesting a refill of her albuterol neb sol, dulera, albuterol inhaler. Told pt that we would refill these meds for her.  Pt is also needing prednisone to be called in for her due to being in an asthma flare. Dr. Halford Chessman, please advise if you are okay prescribing pred rx for pt. Thanks!

## 2019-10-08 NOTE — Telephone Encounter (Signed)
Spoke with pt. She is wanting to schedule her first Fasenra injection, again. Pt has had 5 appointments that she has either no showed or canceled to do her first injection. She has been scheduled for 10/15/2019 at 0915, per her request. Pt is aware of our office policy when it comes to their first injection >> 2 hours wait, Epipen.  Advised pt that she needs to make sure that she keeps this appointment. Nothing further was needed.

## 2019-10-09 ENCOUNTER — Ambulatory Visit (INDEPENDENT_AMBULATORY_CARE_PROVIDER_SITE_OTHER): Payer: Medicaid Other | Admitting: Adult Health

## 2019-10-09 ENCOUNTER — Encounter (HOSPITAL_COMMUNITY): Payer: Self-pay | Admitting: *Deleted

## 2019-10-09 ENCOUNTER — Other Ambulatory Visit: Payer: Self-pay

## 2019-10-09 ENCOUNTER — Encounter: Payer: Self-pay | Admitting: Adult Health

## 2019-10-09 ENCOUNTER — Inpatient Hospital Stay (HOSPITAL_COMMUNITY)
Admission: EM | Admit: 2019-10-09 | Discharge: 2019-10-11 | DRG: 202 | Disposition: A | Discharge status: Home / Self Care | Payer: Medicaid Other | Attending: Internal Medicine | Admitting: Internal Medicine

## 2019-10-09 ENCOUNTER — Emergency Department (HOSPITAL_COMMUNITY): Payer: Medicaid Other

## 2019-10-09 DIAGNOSIS — Z7951 Long term (current) use of inhaled steroids: Secondary | ICD-10-CM

## 2019-10-09 DIAGNOSIS — R000 Tachycardia, unspecified: Secondary | ICD-10-CM | POA: Diagnosis not present

## 2019-10-09 DIAGNOSIS — J45901 Unspecified asthma with (acute) exacerbation: Secondary | ICD-10-CM | POA: Diagnosis present

## 2019-10-09 DIAGNOSIS — Z87891 Personal history of nicotine dependence: Secondary | ICD-10-CM

## 2019-10-09 DIAGNOSIS — R0689 Other abnormalities of breathing: Secondary | ICD-10-CM | POA: Diagnosis not present

## 2019-10-09 DIAGNOSIS — I10 Essential (primary) hypertension: Secondary | ICD-10-CM | POA: Diagnosis not present

## 2019-10-09 DIAGNOSIS — K219 Gastro-esophageal reflux disease without esophagitis: Secondary | ICD-10-CM | POA: Diagnosis present

## 2019-10-09 DIAGNOSIS — Z96652 Presence of left artificial knee joint: Secondary | ICD-10-CM | POA: Diagnosis present

## 2019-10-09 DIAGNOSIS — E7439 Other disorders of intestinal carbohydrate absorption: Secondary | ICD-10-CM | POA: Diagnosis present

## 2019-10-09 DIAGNOSIS — Z20828 Contact with and (suspected) exposure to other viral communicable diseases: Secondary | ICD-10-CM | POA: Diagnosis not present

## 2019-10-09 DIAGNOSIS — Z791 Long term (current) use of non-steroidal anti-inflammatories (NSAID): Secondary | ICD-10-CM

## 2019-10-09 DIAGNOSIS — R0602 Shortness of breath: Secondary | ICD-10-CM | POA: Diagnosis not present

## 2019-10-09 DIAGNOSIS — G4733 Obstructive sleep apnea (adult) (pediatric): Secondary | ICD-10-CM | POA: Diagnosis not present

## 2019-10-09 DIAGNOSIS — G8929 Other chronic pain: Secondary | ICD-10-CM | POA: Diagnosis present

## 2019-10-09 DIAGNOSIS — Z8249 Family history of ischemic heart disease and other diseases of the circulatory system: Secondary | ICD-10-CM

## 2019-10-09 DIAGNOSIS — R7303 Prediabetes: Secondary | ICD-10-CM | POA: Diagnosis present

## 2019-10-09 DIAGNOSIS — Z9119 Patient's noncompliance with other medical treatment and regimen: Secondary | ICD-10-CM

## 2019-10-09 DIAGNOSIS — M1990 Unspecified osteoarthritis, unspecified site: Secondary | ICD-10-CM | POA: Diagnosis present

## 2019-10-09 DIAGNOSIS — I5032 Chronic diastolic (congestive) heart failure: Secondary | ICD-10-CM | POA: Diagnosis present

## 2019-10-09 DIAGNOSIS — I509 Heart failure, unspecified: Secondary | ICD-10-CM | POA: Diagnosis present

## 2019-10-09 DIAGNOSIS — J4551 Severe persistent asthma with (acute) exacerbation: Secondary | ICD-10-CM | POA: Diagnosis not present

## 2019-10-09 DIAGNOSIS — Z91018 Allergy to other foods: Secondary | ICD-10-CM

## 2019-10-09 DIAGNOSIS — J9611 Chronic respiratory failure with hypoxia: Secondary | ICD-10-CM | POA: Diagnosis present

## 2019-10-09 DIAGNOSIS — Z91040 Latex allergy status: Secondary | ICD-10-CM

## 2019-10-09 DIAGNOSIS — R739 Hyperglycemia, unspecified: Secondary | ICD-10-CM | POA: Diagnosis present

## 2019-10-09 DIAGNOSIS — I110 Hypertensive heart disease with heart failure: Secondary | ICD-10-CM | POA: Diagnosis present

## 2019-10-09 DIAGNOSIS — Z79899 Other long term (current) drug therapy: Secondary | ICD-10-CM

## 2019-10-09 DIAGNOSIS — Z6841 Body Mass Index (BMI) 40.0 and over, adult: Secondary | ICD-10-CM

## 2019-10-09 DIAGNOSIS — Z7982 Long term (current) use of aspirin: Secondary | ICD-10-CM

## 2019-10-09 DIAGNOSIS — J9620 Acute and chronic respiratory failure, unspecified whether with hypoxia or hypercapnia: Secondary | ICD-10-CM | POA: Diagnosis present

## 2019-10-09 DIAGNOSIS — Z79891 Long term (current) use of opiate analgesic: Secondary | ICD-10-CM

## 2019-10-09 DIAGNOSIS — Z91048 Other nonmedicinal substance allergy status: Secondary | ICD-10-CM

## 2019-10-09 LAB — CBC WITH DIFFERENTIAL/PLATELET
Abs Immature Granulocytes: 0.02 10*3/uL (ref 0.00–0.07)
Basophils Absolute: 0.1 10*3/uL (ref 0.0–0.1)
Basophils Relative: 1 %
Eosinophils Absolute: 0.5 10*3/uL (ref 0.0–0.5)
Eosinophils Relative: 7 %
HCT: 43.7 % (ref 36.0–46.0)
Hemoglobin: 13.6 g/dL (ref 12.0–15.0)
Immature Granulocytes: 0 %
Lymphocytes Relative: 32 %
Lymphs Abs: 2.5 10*3/uL (ref 0.7–4.0)
MCH: 28.3 pg (ref 26.0–34.0)
MCHC: 31.1 g/dL (ref 30.0–36.0)
MCV: 90.9 fL (ref 80.0–100.0)
Monocytes Absolute: 0.7 10*3/uL (ref 0.1–1.0)
Monocytes Relative: 9 %
Neutro Abs: 4 10*3/uL (ref 1.7–7.7)
Neutrophils Relative %: 51 %
Platelets: 301 10*3/uL (ref 150–400)
RBC: 4.81 MIL/uL (ref 3.87–5.11)
RDW: 17.2 % — ABNORMAL HIGH (ref 11.5–15.5)
WBC: 7.8 10*3/uL (ref 4.0–10.5)
nRBC: 0 % (ref 0.0–0.2)

## 2019-10-09 LAB — BASIC METABOLIC PANEL
Anion gap: 6 (ref 5–15)
BUN: 13 mg/dL (ref 6–20)
CO2: 28 mmol/L (ref 22–32)
Calcium: 8.6 mg/dL — ABNORMAL LOW (ref 8.9–10.3)
Chloride: 108 mmol/L (ref 98–111)
Creatinine, Ser: 0.92 mg/dL (ref 0.44–1.00)
GFR calc Af Amer: >60 mL/min (ref >60)
GFR calc non Af Amer: >60 mL/min (ref >60)
Glucose, Bld: 117 mg/dL — ABNORMAL HIGH (ref 70–99)
Potassium: 5 mmol/L (ref 3.5–5.1)
Sodium: 142 mmol/L (ref 135–145)

## 2019-10-09 LAB — BRAIN NATRIURETIC PEPTIDE: B Natriuretic Peptide: 40 pg/mL (ref 0.0–100.0)

## 2019-10-09 LAB — POC SARS CORONAVIRUS 2 AG -  ED: SARS Coronavirus 2 Ag: NEGATIVE

## 2019-10-09 MED ORDER — ONDANSETRON HCL 4 MG PO TABS: 4.0000 mg | ORAL_TABLET | Freq: Four times a day (QID) | ORAL | Status: DC | PRN

## 2019-10-09 MED ORDER — TRAMADOL HCL 50 MG PO TABS: 50.0000 mg | ORAL_TABLET | Freq: Four times a day (QID) | ORAL | Status: DC | PRN

## 2019-10-09 MED ORDER — ALBUTEROL SULFATE HFA 108 (90 BASE) MCG/ACT IN AERS
4.0000 | INHALATION_SPRAY | RESPIRATORY_TRACT | Status: DC
  Administered 2019-10-09: 4 via RESPIRATORY_TRACT
  Administered 2019-10-09 (×2): 8 via RESPIRATORY_TRACT
  Filled 2019-10-09: qty 6.7

## 2019-10-09 MED ORDER — PANTOPRAZOLE SODIUM 40 MG PO TBEC
40.0000 mg | DELAYED_RELEASE_TABLET | Freq: Every day | ORAL | Status: DC
  Administered 2019-10-10 – 2019-10-11 (×2): 40 mg via ORAL
  Filled 2019-10-09 (×2): qty 1

## 2019-10-09 MED ORDER — ONDANSETRON HCL 4 MG/2ML IJ SOLN: 4.0000 mg | Freq: Four times a day (QID) | INTRAMUSCULAR | Status: DC | PRN

## 2019-10-09 MED ORDER — CLONIDINE HCL 0.1 MG PO TABS
0.1000 mg | ORAL_TABLET | Freq: Two times a day (BID) | ORAL | Status: DC
  Administered 2019-10-09 – 2019-10-11 (×4): 0.1 mg via ORAL
  Filled 2019-10-09 (×4): qty 1

## 2019-10-09 MED ORDER — METHOCARBAMOL 500 MG PO TABS
500.0000 mg | ORAL_TABLET | Freq: Three times a day (TID) | ORAL | Status: DC | PRN
  Administered 2019-10-10: 500 mg via ORAL
  Filled 2019-10-09: qty 1

## 2019-10-09 MED ORDER — ACETAMINOPHEN 325 MG PO TABS: 650.0000 mg | ORAL_TABLET | Freq: Four times a day (QID) | ORAL | Status: DC | PRN

## 2019-10-09 MED ORDER — SODIUM CHLORIDE 0.9 % IV SOLN: 250.0000 mL | INTRAVENOUS | Status: DC | PRN

## 2019-10-09 MED ORDER — ACETAMINOPHEN 650 MG RE SUPP: 650.0000 mg | Freq: Four times a day (QID) | RECTAL | Status: DC | PRN

## 2019-10-09 MED ORDER — POLYETHYLENE GLYCOL 3350 17 G PO PACK: 17.0000 g | PACK | Freq: Every day | ORAL | Status: DC | PRN

## 2019-10-09 MED ORDER — SODIUM CHLORIDE 0.9% FLUSH
3.0000 mL | Freq: Two times a day (BID) | INTRAVENOUS | Status: DC
  Administered 2019-10-10 – 2019-10-11 (×3): 3 mL via INTRAVENOUS

## 2019-10-09 MED ORDER — ASPIRIN EC 81 MG PO TBEC
81.0000 mg | DELAYED_RELEASE_TABLET | Freq: Every day | ORAL | Status: DC
  Administered 2019-10-10 – 2019-10-11 (×2): 81 mg via ORAL
  Filled 2019-10-09 (×2): qty 1

## 2019-10-09 MED ORDER — METHYLPREDNISOLONE SODIUM SUCC 125 MG IJ SOLR
60.0000 mg | Freq: Four times a day (QID) | INTRAMUSCULAR | Status: DC
  Administered 2019-10-09 – 2019-10-11 (×6): 60 mg via INTRAVENOUS
  Filled 2019-10-09 (×6): qty 2

## 2019-10-09 MED ORDER — SODIUM CHLORIDE 0.9% FLUSH
3.0000 mL | Freq: Two times a day (BID) | INTRAVENOUS | Status: DC
  Administered 2019-10-10 – 2019-10-11 (×2): 3 mL via INTRAVENOUS

## 2019-10-09 MED ORDER — GABAPENTIN 300 MG PO CAPS
300.0000 mg | ORAL_CAPSULE | Freq: Two times a day (BID) | ORAL | Status: DC
  Administered 2019-10-09 – 2019-10-11 (×4): 300 mg via ORAL
  Filled 2019-10-09 (×4): qty 1

## 2019-10-09 MED ORDER — MELOXICAM 7.5 MG PO TABS
7.5000 mg | ORAL_TABLET | Freq: Every day | ORAL | Status: DC
  Administered 2019-10-10 – 2019-10-11 (×2): 7.5 mg via ORAL
  Filled 2019-10-09 (×2): qty 1

## 2019-10-09 MED ORDER — MOMETASONE FURO-FORMOTEROL FUM 200-5 MCG/ACT IN AERO
2.0000 | INHALATION_SPRAY | Freq: Two times a day (BID) | RESPIRATORY_TRACT | Status: DC
  Administered 2019-10-09 – 2019-10-10 (×3): 2 via RESPIRATORY_TRACT
  Filled 2019-10-09: qty 8.8

## 2019-10-09 MED ORDER — MONTELUKAST SODIUM 10 MG PO TABS
10.0000 mg | ORAL_TABLET | Freq: Every day | ORAL | Status: DC
  Administered 2019-10-09 – 2019-10-10 (×2): 10 mg via ORAL
  Filled 2019-10-09 (×3): qty 1

## 2019-10-09 MED ORDER — MAGNESIUM SULFATE 2 GM/50ML IV SOLN
2.0000 g | Freq: Once | INTRAVENOUS | Status: AC
  Administered 2019-10-09: 2 g via INTRAVENOUS
  Filled 2019-10-09: qty 50

## 2019-10-09 MED ORDER — ENOXAPARIN SODIUM 40 MG/0.4ML ~~LOC~~ SOLN
40.0000 mg | SUBCUTANEOUS | Status: DC
  Filled 2019-10-09: qty 0.4

## 2019-10-09 MED ORDER — SODIUM CHLORIDE 0.9 % IV SOLN: INTRAVENOUS | Status: DC

## 2019-10-09 MED ORDER — SODIUM CHLORIDE 0.9% FLUSH: 3.0000 mL | INTRAVENOUS | Status: DC | PRN

## 2019-10-09 NOTE — Patient Instructions (Signed)
ER Evaluation , Call 911 .  Follow up in office in 1 week after your hospital stay /visit .  Please contact office for sooner follow up if symptoms do not improve or worsen or seek emergency care

## 2019-10-09 NOTE — Telephone Encounter (Signed)
LMTCB

## 2019-10-09 NOTE — Progress Notes (Signed)
Virtual Visit via Telephone Note  I connected with [name] on 10/09/19 at 11:45 AM EST by telephone and verified that I am speaking with the correct person using two identifiers.  Location: Patient: Home Provider: Office   I discussed the limitations, risks, security and privacy concerns of performing an evaluation and management service by telephone and the availability of in person appointments. I also discussed with the patient that there may be a patient responsible charge related to this service. The patient expressed understanding and agreed to proceed.   History of Present Illness: 53 year old female former smoker followed for allergic asthma, post infection related bronchiectasis and obstructive sleep apnea. Medical history significant for polysubstance abuse-cocaine Today's televisit is for an acute office visit . She says over last 3 days she has been having severe asthma attack. Is very short of breath at rest, trying to talk with dry cough and wheezing . Has been using her albuterol nebulizer every 2hr without much help. Has not taken dulera today because feels she can not get it in due to her shortness of breath.  Patient was seen last month for difficulty control asthma despite aggressive maintenance regimen.  She has been set up for Fasenra injections.  Last visit she was given prednisone burst.  Patient says she got some better but symptoms returned in the last 3 days  Patient has been set up for several appointments for her initial Fasenra injection but has either no showed or canceled these appointments.    Patient Active Problem List   Diagnosis Date Noted  . COPD mixed type ([place]) 07/04/2019  . Oral thrush 07/04/2019  . Medication management 07/04/2019  . Chest pain 12/25/2018  . Acute on chronic respiratory failure with hypoxia ([place]) 12/24/2018  . Skin ulcer ([place]) secondary to bullous impetigo Marilynn Rail 08/29/2018  . Asthma 10/22/2017  . Osteoarthritis of left  knee 09/01/2016  . 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 Tobacco abuse   . Primary osteoarthritis of left knee 03/09/2016  . Non compliance with medical treatment 08/19/2015  . Anemia, iron deficiency 08/12/2015  . Essential hypertension 07/25/2015  . COPD exacerbation ([place]) 12/28/2014  . Dysfunctional uterine bleeding 12/28/2014  . Anemia 12/28/2014  . Morbid obesity ([place]) 05/16/2014  . Chronic cough 05/01/2014  . Upper airway cough syndrome 05/01/2014  . GERD (gastroesophageal reflux disease) 04/08/2014  . Asthma exacerbation 04/06/2014  . OSA (obstructive sleep apnea) 03/20/2013  . Uncontrolled persistent asthma 02/05/2013   Current Outpatient Medications on File Prior to Visit  Medication Sig Dispense Refill  . acetaminophen (TYLENOL) 500 MG tablet Take 500 mg by mouth every 6 (six) hours as needed for mild pain.    Marland Kitchen albuterol (PROVENTIL) (2.5 MG/3ML) 0.083% nebulizer solution Take 3 mLs (2.5 mg total) by nebulization every 6 (six) hours as needed for wheezing or shortness of breath. 75 mL 2  . albuterol (VENTOLIN HFA) 108 (90 Base) MCG/ACT inhaler Inhale 2 puffs into the lungs every 6 (six) hours as needed for wheezing or shortness of breath. 6.7 g 5  . aspirin 81 MG EC tablet Take 1 tablet (81 mg total) by mouth daily. 30 tablet 3  . Camphor-Eucalyptus-Menthol (VICKS VAPORUB EX) Apply 1 application topically daily as needed (congestion).    . cloNIDine (CATAPRES) 0.1 MG tablet Take 1 tablet (0.1 mg total) by mouth 2 (two) times daily. 60 tablet 3  . fluticasone (FLONASE) 50 MCG/ACT nasal spray Place 1 spray into  both nostrils daily. (Patient taking differently: Place 1 spray into both nostrils daily as needed for allergies. ) 16 g 5  . gabapentin (NEURONTIN) 300 MG capsule Take 1 capsule (300 mg total) by mouth 2 (two) times daily. 60 capsule 2  . ipratropium (ATROVENT) 0.02 % nebulizer solution Take 2.5 mLs (0.5 mg total) by  nebulization 2 (two) times daily as needed for wheezing or shortness of breath. 75 mL 0  . loratadine (CLARITIN) 10 MG tablet Take 1 tablet (10 mg total) by mouth daily. 30 tablet 11  . meloxicam (MOBIC) 7.5 MG tablet Take 1 tablet (7.5 mg total) by mouth daily. 30 tablet 1  . Menthol (HALLS COUGH DROPS MT) Use as directed 1 drop in the mouth or throat 2 (two) times daily as needed (sore throat and cough).    . methocarbamol (ROBAXIN) 500 MG tablet TAKE 1 TABLET (500 MG TOTAL) BY MOUTH EVERY 8 (EIGHT) HOURS AS NEEDED FOR MUSCLE SPASMS. 60 tablet 1  . Misc. Devices MISC Blood pressure monitor  Dx: Hypertension 1 each 0  . mometasone-formoterol (DULERA) 200-5 MCG/ACT AERO Inhale 2 puffs into the lungs 2 (two) times daily. 8.8 g 2  . montelukast (SINGULAIR) 10 MG tablet Take 1 tablet (10 mg total) by mouth at bedtime. 90 tablet 1  . pantoprazole (PROTONIX) 40 MG tablet Take 1 tablet (40 mg total) by mouth daily at 12 noon. 30 tablet 3  . Spacer/Aero-Holding Chambers (AEROCHAMBER MV) inhaler Use as instructed 1 each 2  . traMADol (ULTRAM) 50 MG tablet Take 1 tablet (50 mg total) by mouth every 6 (six) hours as needed. 10 tablet 0  . urea (CARMOL) 20 % cream Apply topically as needed. 85 g 1   No current facility-administered medications on file prior to visit.    Observations/Objective: 10/09/2019 -very dyspnea , with gasping on the phone . Sounds in distress.   Labs 02/11/13 >> ANA negative, Anti-GBM Ab < 1, ANCA negative PFT 05/15/13>>FEV1 1.90 (76%), FEV1% 72, TLC 4.23 (81%), DLCO 89%, no BD PFT 03/03/17 >> FEV1 1.57 (64%), FEV1% 64, TLC 4.47 (84%), DLCO 685, + BD RAST 07/28/17 >> mild reaction to trees, ragweed; IgE 446  Chest imaging:  CT chest4/15/14 >>no PE, no acute findings HRCT chest 03/12/18 >> mild BTX   Assessment and Plan: Severe Asthma exacerbation - unresponsive to aggressive OP maintenance regimen.  Using SABA every 2hr without improvement .  Worry for potential for  decompensation  Have asked her to call 911 and go to ER . She says she will do this ASAP . Husband is home and will help her  Needs higher level care and evaluation with treatment plan    Called patient back and verified she is calling and awaiting EMS , completing her breathing treatment .   Plan  Patient Instructions  ER Evaluation , Call 911 .  Follow up in office in 1 week after your hospital stay /visit .  Please contact office for sooner follow up if symptoms do not improve or worsen or seek emergency care        Follow Up Instructions: Needs  ER evaluation , will need 911 transport due to severity of symptoms   To follow up in 1 week post hospital visit/stay .     I discussed the assessment and treatment plan with the patient. The patient was provided an opportunity to ask questions and all were answered. The patient agreed with the plan and demonstrated an understanding of the  instructions.   The patient was advised to call back or seek an in-person evaluation if the symptoms worsen or if the condition fails to improve as anticipated.  I provided 22 minutes of non-face-to-face time during this encounter.   [name], NP

## 2019-10-09 NOTE — Telephone Encounter (Signed)
Please scheduled tele visit with NP to discuss status of asthma before prescribing prednisone.

## 2019-10-09 NOTE — ED Triage Notes (Addendum)
Pt here via [facility] for sob and wheezing x 3 days.  Hx of intubations for asthma exacerbations.  Breathing labored with audible wheezing.  Given 5 albuterol, 0.5 atrovent and 125 solumedrol enroute.  96% ra, hr 116, rr 16, bp 188/118.

## 2019-10-09 NOTE — H&P (Signed)
History and Physical    Kathleen Rodriguez ZOX:[id number] DOB: 1966-01-20 DOA: 10/09/2019  PCP: Hoy RegisterNewlin, [name], MD   Patient coming from: Home   Chief Complaint: Cough, wheezing, SOB   HPI: Kathleen AgeLouise H Rodriguez is a 53 y.o. female with medical history significant for tobacco and cocaine abuse in remission, severe persistent asthma, OSA on CPAP, and hypertension, now presenting to the emergency department for evaluation of cough, shortness of breath, and wheezing.  Patient reports that she developed the symptoms approximately 3 days ago, has been using her breathing treatments at home, but continues to worsen.  She denies any fevers, chills, chest pain, or leg swelling or tenderness.  She reports that the symptoms are consistent with her prior asthma exacerbations.  She called EMS and was treated with IV Solu-Medrol, albuterol, and Atrovent prior to arrival in the ED.  ED Course: Upon arrival to the ED, patient is found to be afebrile, saturating 100% on 4 L/min of supplemental oxygen, and slightly hypertensive.  EKG features sinus tachycardia with rate 113 and repolarization abnormality that is similar to prior.  Chest x-ray is negative for acute cardiopulmonary disease.  Chemistry panel and CBC are unremarkable.  BNP is normal.  Patient was treated with albuterol, IV fluids, and IV magnesium in the ED.  Covid PCR test is pending.  Review of Systems:  All other systems reviewed and apart from HPI, are negative.  Past Medical History:  Diagnosis Date  . Arthritis    "knees, lower back; legs, ankles" (01/27/2016)  . Asthma    followed by Dr. Craige CottaSood  . CHF (congestive heart failure) (HCC) 2016   "when I went into a coma"  . Cocaine abuse (HCC)   . 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 Dyspnea   . GERD (gastroesophageal reflux disease)   . Hypertension    "doctor took me off RX in 2016" (01/27/2016)  . Influenza B April 2014   Complicated by multi-organ failure  . OSA on CPAP "since " 03/20/2013  .  Pneumonia 2016  . Required emergent intubation    asthma exacerbation in 2016  . Tobacco abuse   . Upper airway cough syndrome     Past Surgical History:  Procedure Laterality Date  . BREAST SURGERY Right 1980   as a teenager , cyst was benign  . CESAREAN SECTION  2006  . LACERATION REPAIR Right ~ 1997   "tried to cut myself"  . TOTAL KNEE ARTHROPLASTY Left 09/01/2016   Procedure: LEFT TOTAL KNEE ARTHROPLASTY;  Surgeon: Tarry KosNaiping M Xu, MD;  Location: [facility] OR;  Service: Orthopedics;  Laterality: Left;  . TUBAL LIGATION  2006     reports that she quit smoking about 6 years ago. Her smoking use included cigarettes. She started smoking about 27 years ago. She has a 5.25 pack-year smoking history. She has never used smokeless tobacco. She reports that she does not drink alcohol or use drugs.  Allergies  Allergen Reactions  . Tomato Hives, Itching and Other (See Comments)    ALSO REACTS TO KETCHUP  . Latex Itching and Rash  . Wool Alcohol [Lanolin] Itching    Family History  Problem Relation Age of Onset  . Hypertension Mother   . HIV/AIDS Father      Prior to Admission medications   Medication Sig Start Date End Date Taking? Authorizing Provider  acetaminophen (TYLENOL) 500 MG tablet Take 500 mg by mouth every 6 (six) hours as needed for mild pain.   Yes [provider]  albuterol (PROVENTIL) (2.5 MG/3ML) 0.083% nebulizer solution Take 3 mLs (2.5 mg total) by nebulization every 6 (six) hours as needed for wheezing or shortness of breath. 04/30/19  Yes Glenford Bayley, NP  albuterol (VENTOLIN HFA) 108 (90 Base) MCG/ACT inhaler Inhale 2 puffs into the lungs every 6 (six) hours as needed for wheezing or shortness of breath. 07/04/19  Yes Coral Ceo, NP  aspirin 81 MG EC tablet Take 1 tablet (81 mg total) by mouth daily. 02/07/19  Yes Newlin, Odette Horns, MD  Camphor-Eucalyptus-Menthol (VICKS VAPORUB EX) Apply 1 application topically daily as needed (congestion).   Yes [provider]  cloNIDine (CATAPRES) 0.1 MG tablet Take 1 tablet (0.1 mg total) by mouth 2 (two) times daily. 02/07/19  Yes Newlin, Odette Horns, MD  fluticasone (FLONASE) 50 MCG/ACT nasal spray Place 1 spray into both nostrils daily. Patient taking differently: Place 1 spray into both nostrils daily as needed for allergies.  02/19/18  Yes [name], MD  gabapentin (NEURONTIN) 300 MG capsule Take 1 capsule (300 mg total) by mouth 2 (two) times daily. 04/22/19  Yes Newlin, Odette Horns, MD  ipratropium (ATROVENT) 0.02 % nebulizer solution Take 2.5 mLs (0.5 mg total) by nebulization 2 (two) times daily as needed for wheezing or shortness of breath. 12/27/18  Yes Hall, Carole N, DO  loratadine (CLARITIN) 10 MG tablet Take 1 tablet (10 mg total) by mouth daily. 09/09/19  Yes [name], MD  meloxicam (MOBIC) 7.5 MG tablet Take 1 tablet (7.5 mg total) by mouth daily. 04/23/19  Yes Hoy Register, MD  Menthol (HALLS COUGH DROPS MT) Use as directed 1 drop in the mouth or throat 2 (two) times daily as needed (sore throat and cough).   Yes [provider]  methocarbamol (ROBAXIN) 500 MG tablet TAKE 1 TABLET (500 MG TOTAL) BY MOUTH EVERY 8 (EIGHT) HOURS AS NEEDED FOR MUSCLE SPASMS. 03/28/19  Yes Hoy Register, MD  Misc. Devices MISC Blood pressure monitor  Dx: Hypertension 02/07/19  Yes Newlin, [name], MD  mometasone-formoterol (DULERA) 200-5 MCG/ACT AERO Inhale 2 puffs into the lungs 2 (two) times daily. 07/29/19  Yes Coral Ceo, NP  montelukast (SINGULAIR) 10 MG tablet Take 1 tablet (10 mg total) by mouth at bedtime. 04/30/19  Yes Glenford Bayley, NP  pantoprazole (PROTONIX) 40 MG tablet Take 1 tablet (40 mg total) by mouth daily at 12 noon. 02/07/19  Yes Hoy Register, MD  Spacer/Aero-Holding Chambers (AEROCHAMBER MV) inhaler Use as instructed 07/11/18  Yes [name], MD  traMADol (ULTRAM) 50 MG tablet Take 1 tablet (50 mg total) by mouth every 6 (six) hours as needed. Patient taking differently: Take  50 mg by mouth every 6 (six) hours as needed for moderate pain.  09/01/19  Yes Henderly, Britni A, PA-C  urea (CARMOL) 20 % cream Apply topically as needed. Patient taking differently: Apply 1 application topically daily as needed (For dry feet).  02/07/19  Yes Hoy Register, MD    Physical Exam: Vitals:   10/09/19 1744 10/09/19 1745 10/09/19 1746  Pulse:  99   Resp:  16   Temp:  98.7 F (37.1 C)   TempSrc:  Oral   SpO2: 100% 100%   Weight:   129.7 kg  Height:    (1.651 m)    Constitutional: NAD, calm  Eyes: PERTLA, lids and conjunctivae normal ENMT: Mucous membranes are moist. Posterior pharynx clear of any exudate or lesions.   Neck: normal, supple, no masses, no thyromegaly  Respiratory: Diminished breath sounds bilaterally. Tachypnea. Increased WOB. No pallor or cyanosis.  Cardiovascular: S1 & S2 heard, regular rate and rhythm. No extremity edema.  Abdomen: No distension, no tenderness, soft. Bowel sounds active.  Musculoskeletal: no clubbing / cyanosis. No joint deformity upper and lower extremities.    Skin: no significant rashes, lesions, ulcers. Warm, dry, well-perfused. Neurologic: no facial asymmetry. Sensation intact. Moving all extremities.  Psychiatric: Alert and oriented to person, place, and situation. Pleasant, cooperative.     Labs on Admission: I have personally reviewed following labs and imaging studies  CBC: Recent Labs  Lab 10/09/19 1812  WBC 7.8  NEUTROABS 4.0  HGB 13.6  HCT 43.7  MCV 90.9  PLT 301   Basic Metabolic Panel: Recent Labs  Lab 10/09/19 1812  NA 142  K 5.0  CL 108  CO2 28  GLUCOSE 117*  BUN 13  CREATININE 0.92  CALCIUM 8.6*   GFR: Estimated Creatinine Clearance: 95 mL/min (by C-G formula based on SCr of 0.92 mg/dL). Liver Function Tests: No results for input(s): AST, ALT, ALKPHOS, BILITOT, PROT, ALBUMIN in the last 168 hours. No results for input(s): LIPASE, AMYLASE in the last 168 hours. No results for input(s):  AMMONIA in the last 168 hours. Coagulation Profile: No results for input(s): INR, PROTIME in the last 168 hours. Cardiac Enzymes: No results for input(s): CKTOTAL, CKMB, CKMBINDEX, TROPONINI in the last 168 hours. BNP (last 3 results) No results for input(s): PROBNP in the last 8760 hours. HbA1C: No results for input(s): HGBA1C in the last 72 hours. CBG: No results for input(s): GLUCAP in the last 168 hours. Lipid Profile: No results for input(s): CHOL, HDL, LDLCALC, TRIG, CHOLHDL, LDLDIRECT in the last 72 hours. Thyroid Function Tests: No results for input(s): TSH, T4TOTAL, FREET4, T3FREE, THYROIDAB in the last 72 hours. Anemia Panel: No results for input(s): VITAMINB12, FOLATE, FERRITIN, TIBC, IRON, RETICCTPCT in the last 72 hours. Urine analysis:    Component Value Date/Time   COLORURINE YELLOW 08/07/2018 1752   APPEARANCEUR CLEAR 08/07/2018 1752   LABSPEC 1.033 (H) 08/07/2018 1752   PHURINE 5.0 08/07/2018 1752   GLUCOSEU NEGATIVE 08/07/2018 1752   HGBUR NEGATIVE 08/07/2018 1752   BILIRUBINUR NEGATIVE 08/07/2018 1752   BILIRUBINUR small 05/26/2017 1539   KETONESUR NEGATIVE 08/07/2018 1752   PROTEINUR NEGATIVE 08/07/2018 1752   UROBILINOGEN 1.0 05/26/2017 1539   UROBILINOGEN 1.0 03/22/2014 0805   NITRITE NEGATIVE 08/07/2018 1752   LEUKOCYTESUR NEGATIVE 08/07/2018 1752   Sepsis Labs: @LABRCNTIP (procalcitonin:4,lacticidven:4) )No results found for this or any previous visit (from the past 240 hour(s)).   Radiological Exams on Admission: DG Chest Port 1 View  Result Date: 10/09/2019 CLINICAL DATA:  Shortness of breath, wheezing EXAM: PORTABLE CHEST 1 VIEW COMPARISON:  09/01/2019 FINDINGS: Heart and mediastinal contours are within normal limits. No focal opacities or effusions. No acute bony abnormality. IMPRESSION: No active disease. Electronically Signed   By: 13/05/2019 M.D.   On: 10/09/2019 18:23    EKG: Independently reviewed. Sinus tachycardia (rate 113),  repolarization abnormality, similar to prior.   Assessment/Plan   1. Acute asthma exacerbation  - Presents with 3 days wheezing, cough, and SOB consistent with her prior asthma exacerbations  - CXR is clear, she denies fevers/chills or chest pain, and there is no evidence for DVT/PE  - She was treated by EMS with albuterol, Atrovent, and Solu-Medrol  - She was treated with IV mag and albuterol MDI in ED  - Continue systemic steroids, continue albuterol, ICS/LABA,  and Singulair   2. OSA on CPAP  - CPAP qHS once COVID PCR confirmed to be negative   3. Hypertension  - BP a little elevated in ED  - Continue clonidine   4. Chronic pain  - No pain complaints on admission  - Continue home regimen     DVT prophylaxis: Lovenox  Code Status: Full  Family Communication: Discussed with patient  Consults called: none  Admission status: Observation     Vianne Bulls, MD [facility] Pager [phone]  If 7PM-7AM, please contact night-coverage [email or web address] Password [email or web address]  10/09/2019, 9:20 PM

## 2019-10-09 NOTE — ED Provider Notes (Signed)
[facility] EMERGENCY DEPARTMENT Provider Note   CSN: [id number] Arrival date & time: 10/09/19  1726     History Chief Complaint  Patient presents with  . Shortness of Breath    Kathleen Rodriguez is a 53 y.o. female.  He has a significant history of asthma and follows with pulmonology.  She is complaining of 3 days of increased shortness of breath.  She is having a cough sometimes productive of some sputum.  No fevers or chills.  No nausea vomiting diarrhea.  She saw her pulmonologist virtually today and they recommended steroids but she felt more short of breath this evening before she had gotten them.  EMS gave her breathing treatments and IV Solu-Medrol.  She feels slightly improved on arrival.  She says this is typical of her exacerbations.  The history is provided by the patient.  Shortness of Breath Severity:  Severe Onset quality:  Gradual Timing:  Constant Progression:  Worsening Chronicity:  Recurrent Context: activity   Relieved by:  Nothing Worsened by:  Activity and coughing Ineffective treatments:  Inhaler Associated symptoms: cough   Associated symptoms: no abdominal pain, no chest pain, no fever, no headaches, no hemoptysis, no neck pain, no rash, no sore throat, no syncope and no vomiting   Risk factors: no tobacco use        Past Medical History:  Diagnosis Date  . Arthritis    "knees, lower back; legs, ankles" (01/27/2016)  . Asthma    followed by Dr. Craige Cotta  . CHF (congestive heart failure) (HCC) 2016   "when I went into a coma"  . Cocaine abuse (HCC)   . 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 Dyspnea   . GERD (gastroesophageal reflux disease)   . Hypertension    "doctor took me off RX in 2016" (01/27/2016)  . Influenza B April 2014   Complicated by multi-organ failure  . OSA on CPAP "since " 03/20/2013  . Pneumonia 2016  . Required emergent intubation    asthma exacerbation in 2016  . Tobacco abuse   . Upper airway cough syndrome       Patient Active Problem List   Diagnosis Date Noted  . 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 Oral thrush 07/04/2019  . Medication management 07/04/2019  . Chest pain 12/25/2018  . Acute on chronic respiratory failure with hypoxia (HCC) 12/24/2018  . Skin ulcer (HCC) secondary to bullous impetigo [name] 08/29/2018  . Asthma 10/22/2017  . Osteoarthritis of left knee 09/01/2016  . 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 Tobacco abuse   . Primary osteoarthritis of left knee 03/09/2016  . Non compliance with medical treatment 08/19/2015  . Anemia, iron deficiency 08/12/2015  . Essential hypertension 07/25/2015  . COPD exacerbation (HCC) 12/28/2014  . Dysfunctional uterine bleeding 12/28/2014  . Anemia 12/28/2014  . Morbid obesity (HCC) 05/16/2014  . Chronic cough 05/01/2014  . Upper airway cough syndrome 05/01/2014  . GERD (gastroesophageal reflux disease) 04/08/2014  . Asthma exacerbation 04/06/2014  . OSA (obstructive sleep apnea) 03/20/2013  . Uncontrolled persistent asthma 02/05/2013    Past Surgical History:  Procedure Laterality Date  . BREAST SURGERY Right 1980   as a teenager , cyst was benign  . CESAREAN SECTION  2006  . LACERATION REPAIR Right ~ 1997   "tried to cut myself"  . TOTAL KNEE ARTHROPLASTY Left 09/01/2016   Procedure: LEFT TOTAL KNEE ARTHROPLASTY;  Surgeon: Tarry Kos,  MD;  Location: [facility] OR;  Service: Orthopedics;  Laterality: Left;  . TUBAL LIGATION  2006     OB History   No obstetric history on file.     Family History  Problem Relation Age of Onset  . Hypertension Mother   . HIV/AIDS Father     Social History   Tobacco Use  . Smoking status: Former Smoker    Packs/day: 0.25    Years: 21.00    Pack years: 5.25    Types: Cigarettes    Start date: 80    Quit date: 01/31/2013    Years since quitting: 6.6  . Smokeless tobacco: Never Used  Substance Use Topics  . Alcohol use: No     Comment: 01/26/2006 "clean from drinking since 2007"  . Drug use: No    Types: "Crack" cocaine    Comment: 01/27/2016 "clean from smoking crack since 2007"    Home Medications Prior to Admission medications   Medication Sig Start Date End Date Taking? Authorizing Provider  acetaminophen (TYLENOL) 500 MG tablet Take 500 mg by mouth every 6 (six) hours as needed for mild pain.    [provider]  albuterol (PROVENTIL) (2.5 MG/3ML) 0.083% nebulizer solution Take 3 mLs (2.5 mg total) by nebulization every 6 (six) hours as needed for wheezing or shortness of breath. 04/30/19   Glenford Bayley, NP  albuterol (VENTOLIN HFA) 108 (90 Base) MCG/ACT inhaler Inhale 2 puffs into the lungs every 6 (six) hours as needed for wheezing or shortness of breath. 07/04/19   Coral Ceo, NP  aspirin 81 MG EC tablet Take 1 tablet (81 mg total) by mouth daily. 02/07/19   Hoy Register, MD  Camphor-Eucalyptus-Menthol (VICKS VAPORUB EX) Apply 1 application topically daily as needed (congestion).    [provider]  cloNIDine (CATAPRES) 0.1 MG tablet Take 1 tablet (0.1 mg total) by mouth 2 (two) times daily. 02/07/19   Hoy Register, MD  fluticasone (FLONASE) 50 MCG/ACT nasal spray Place 1 spray into both nostrils daily. Patient taking differently: Place 1 spray into both nostrils daily as needed for allergies.  02/19/18   [name], MD  gabapentin (NEURONTIN) 300 MG capsule Take 1 capsule (300 mg total) by mouth 2 (two) times daily. 04/22/19   Hoy Register, MD  ipratropium (ATROVENT) 0.02 % nebulizer solution Take 2.5 mLs (0.5 mg total) by nebulization 2 (two) times daily as needed for wheezing or shortness of breath. 12/27/18   Darlin Drop, DO  loratadine (CLARITIN) 10 MG tablet Take 1 tablet (10 mg total) by mouth daily. 09/09/19   [name], MD  meloxicam (MOBIC) 7.5 MG tablet Take 1 tablet (7.5 mg total) by mouth daily. 04/23/19   Hoy Register, MD  Menthol (HALLS COUGH DROPS MT) Use as  directed 1 drop in the mouth or throat 2 (two) times daily as needed (sore throat and cough).    [provider]  methocarbamol (ROBAXIN) 500 MG tablet TAKE 1 TABLET (500 MG TOTAL) BY MOUTH EVERY 8 (EIGHT) HOURS AS NEEDED FOR MUSCLE SPASMS. 03/28/19   Hoy Register, MD  Misc. Devices MISC Blood pressure monitor  Dx: Hypertension 02/07/19   Hoy Register, MD  mometasone-formoterol (DULERA) 200-5 MCG/ACT AERO Inhale 2 puffs into the lungs 2 (two) times daily. 07/29/19   Coral Ceo, NP  montelukast (SINGULAIR) 10 MG tablet Take 1 tablet (10 mg total) by mouth at bedtime. 04/30/19   Glenford Bayley, NP  pantoprazole (PROTONIX) 40 MG tablet Take  1 tablet (40 mg total) by mouth daily at 12 noon. 02/07/19   Hoy Register, MD  Spacer/Aero-Holding Chambers (AEROCHAMBER MV) inhaler Use as instructed 07/11/18   [name], MD  traMADol (ULTRAM) 50 MG tablet Take 1 tablet (50 mg total) by mouth every 6 (six) hours as needed. 09/01/19   Henderly, Britni A, PA-C  urea (CARMOL) 20 % cream Apply topically as needed. 02/07/19   Hoy Register, MD    Allergies    Tomato, Latex, and Wool alcohol [lanolin]  Review of Systems   Review of Systems  Constitutional: Negative for fever.  HENT: Negative for sore throat.   Eyes: Negative for visual disturbance.  Respiratory: Positive for cough and shortness of breath. Negative for hemoptysis.   Cardiovascular: Negative for chest pain and syncope.  Gastrointestinal: Negative for abdominal pain and vomiting.  Genitourinary: Negative for dysuria.  Musculoskeletal: Negative for neck pain.  Skin: Negative for rash.  Neurological: Negative for headaches.    Physical Exam Updated Vital Signs BP (!) 162/75   Pulse (!) 102   Temp 98.5 F (36.9 C)   Resp (!) 23   Ht  (1.651 m)   Wt 129.7 kg   SpO2 93%   BMI 47.59 kg/m   Physical Exam Vitals and nursing note reviewed.  Constitutional:      General: She is not in acute distress.     Appearance: She is well-developed.  HENT:     Head: Normocephalic and atraumatic.  Eyes:     Conjunctiva/sclera: Conjunctivae normal.  Cardiovascular:     Rate and Rhythm: Regular rhythm. Tachycardia present.     Pulses: Normal pulses.     Heart sounds: No murmur.  Pulmonary:     Effort: Tachypnea and accessory muscle usage present. No respiratory distress.     Breath sounds: Wheezing present.  Abdominal:     Palpations: Abdomen is soft.     Tenderness: There is no abdominal tenderness.  Musculoskeletal:        General: No deformity. Normal range of motion.     Cervical back: Neck supple.     Right lower leg: No edema.     Left lower leg: No edema.  Skin:    General: Skin is warm and dry.     Capillary Refill: Capillary refill takes less than 2 seconds.  Neurological:     General: No focal deficit present.     Mental Status: She is alert.     ED Results / Procedures / Treatments   Labs (all labs ordered are listed, but only abnormal results are displayed) Labs Reviewed  BASIC METABOLIC PANEL - Abnormal; Notable for the following components:      Result Value   Glucose, Bld 117 (*)    Calcium 8.6 (*)    All other components within normal limits  CBC WITH DIFFERENTIAL/PLATELET - Abnormal; Notable for the following components:   RDW 17.2 (*)    All other components within normal limits  BASIC METABOLIC PANEL - Abnormal; Notable for the following components:   Glucose, Bld 214 (*)    Creatinine, Ser 1.02 (*)    Calcium 8.4 (*)    All other components within normal limits  CBC - Abnormal; Notable for the following components:   RDW 17.2 (*)    All other components within normal limits  SARS CORONAVIRUS 2 (TAT 6-24 HRS)  BRAIN NATRIURETIC PEPTIDE  HIV ANTIBODY (ROUTINE TESTING W REFLEX)  POC SARS CORONAVIRUS 2 AG -  ED    EKG EKG Interpretation  Date/Time:  Wednesday October 09 2019 17:42:13 EST Ventricular Rate:  113 PR Interval:    QRS Duration: 99 QT  Interval:  342 QTC Calculation: 469 R Axis:   82 Text Interpretation: Sinus tachycardia Anterolateral infarct, age indeterminate Repol abnrm, severe global ischemia (LM/MVD) Rate faster since piror 3/20 Confirmed by Meridee ScoreButler, Maison Kestenbaum [phone]([id number]) on 10/09/2019 6:36:12 PM   Radiology DG Chest Port 1 View  Result Date: 10/09/2019 CLINICAL DATA:  Shortness of breath, wheezing EXAM: PORTABLE CHEST 1 VIEW COMPARISON:  09/01/2019 FINDINGS: Heart and mediastinal contours are within normal limits. No focal opacities or effusions. No acute bony abnormality. IMPRESSION: No active disease. Electronically Signed   By: Charlett NoseKevin  Dover M.D.   On: 10/09/2019 18:23    Procedures .Critical Care Performed by: Terrilee FilesButler, Hamdi Kley C, MD Authorized by: Terrilee FilesButler, Margie Urbanowicz C, MD   Critical care provider statement:    Critical care time (minutes):  45   Critical care time was exclusive of:  Separately billable procedures and treating other patients   Critical care was necessary to treat or prevent imminent or life-threatening deterioration of the following conditions:  Respiratory failure   Critical care was time spent personally by me on the following activities:  Discussions with consultants, evaluation of patient's response to treatment, examination of patient, ordering and performing treatments and interventions, ordering and review of laboratory studies, ordering and review of radiographic studies, pulse oximetry, re-evaluation of patient's condition, obtaining history from patient or surrogate, review of old charts and development of treatment plan with patient or surrogate   I assumed direction of critical care for this patient from another provider in my specialty: no     (including critical care time)  Medications Ordered in ED Medications  0.9 %  sodium chloride infusion ( Intravenous New Bag/Given 10/09/19 1809)  albuterol (VENTOLIN HFA) 108 (90 Base) MCG/ACT inhaler 4-8 puff (4 puffs Inhalation Given 10/09/19 1811)   magnesium sulfate IVPB 2 g 50 mL (2 g Intravenous New Bag/Given 10/09/19 1811)    ED Course  I have reviewed the triage vital signs and the nursing notes.  Pertinent labs & imaging results that were available during my care of the patient were reviewed by me and considered in my medical decision making (see chart for details).  Clinical Course as of Oct 09 952  Wed Oct 09, 2019  [id number] 53 year old female with significant asthma history here with increased shortness of breath for a few days.  Tachycardic tachypneic and using accessory muscles although not hypoxic.  Diffuse wheezing on exam.  Differential includes asthma exacerbation, CHF, Covid, pneumonia, pneumothorax.  She is already received steroids will continue with some IV magnesium and more breathing treatments.   [MB]  1827 Chest x-ray interpreted by me as no pneumothorax no gross infiltrates.   [MB]  1900 Evaluated, patient remains fairly tight and wheezy.  Sats are good and blood pressure and heart rate are improved.  He likely will need to be admitted and I will order some Covid testing.   [MB]  1947 Discussed with Dr. Antionette Charpyd from [facility] who will evaluate the patient for admission.   [MB]    Clinical Course User Index [MB] Terrilee FilesButler, Ayad Nieman C, MD   MDM Rules/Calculators/A&P                     Star AgeLouise H Hennick was evaluated in Emergency Department on 10/09/2019 for the symptoms described in the history of present  illness. She was evaluated in the context of the global COVID-19 pandemic, which necessitated consideration that the patient might be at risk for infection with the SARS-CoV-2 virus that causes COVID-19. Institutional protocols and algorithms that pertain to the evaluation of patients at risk for COVID-19 are in a state of rapid change based on information released by regulatory bodies including the CDC and federal and state organizations. These policies and algorithms were followed during the patient's care in the  ED.   Final Clinical Impression(s) / ED Diagnoses Final diagnoses:  Severe persistent asthma with exacerbation    Rx / DC Orders ED Discharge Orders    None       Hayden Rasmussen, MD 10/10/19 [phone]

## 2019-10-09 NOTE — ED Notes (Signed)
Junie Panning [phone] mother is looking for an update on patient

## 2019-10-10 ENCOUNTER — Encounter (HOSPITAL_COMMUNITY): Payer: Self-pay | Admitting: Family Medicine

## 2019-10-10 DIAGNOSIS — R000 Tachycardia, unspecified: Secondary | ICD-10-CM | POA: Diagnosis not present

## 2019-10-10 DIAGNOSIS — K219 Gastro-esophageal reflux disease without esophagitis: Secondary | ICD-10-CM | POA: Diagnosis present

## 2019-10-10 DIAGNOSIS — Z9119 Patient's noncompliance with other medical treatment and regimen: Secondary | ICD-10-CM | POA: Diagnosis not present

## 2019-10-10 DIAGNOSIS — Z91040 Latex allergy status: Secondary | ICD-10-CM | POA: Diagnosis not present

## 2019-10-10 DIAGNOSIS — Z8249 Family history of ischemic heart disease and other diseases of the circulatory system: Secondary | ICD-10-CM | POA: Diagnosis not present

## 2019-10-10 DIAGNOSIS — Z20828 Contact with and (suspected) exposure to other viral communicable diseases: Secondary | ICD-10-CM | POA: Diagnosis not present

## 2019-10-10 DIAGNOSIS — Z91048 Other nonmedicinal substance allergy status: Secondary | ICD-10-CM | POA: Diagnosis not present

## 2019-10-10 DIAGNOSIS — Z91018 Allergy to other foods: Secondary | ICD-10-CM | POA: Diagnosis not present

## 2019-10-10 DIAGNOSIS — J4551 Severe persistent asthma with (acute) exacerbation: Secondary | ICD-10-CM | POA: Diagnosis not present

## 2019-10-10 DIAGNOSIS — I509 Heart failure, unspecified: Secondary | ICD-10-CM | POA: Diagnosis present

## 2019-10-10 DIAGNOSIS — R739 Hyperglycemia, unspecified: Secondary | ICD-10-CM | POA: Diagnosis not present

## 2019-10-10 DIAGNOSIS — G4733 Obstructive sleep apnea (adult) (pediatric): Secondary | ICD-10-CM | POA: Diagnosis not present

## 2019-10-10 DIAGNOSIS — E7439 Other disorders of intestinal carbohydrate absorption: Secondary | ICD-10-CM | POA: Diagnosis present

## 2019-10-10 DIAGNOSIS — Z6841 Body Mass Index (BMI) 40.0 and over, adult: Secondary | ICD-10-CM | POA: Diagnosis not present

## 2019-10-10 DIAGNOSIS — R0602 Shortness of breath: Secondary | ICD-10-CM | POA: Diagnosis not present

## 2019-10-10 DIAGNOSIS — M1990 Unspecified osteoarthritis, unspecified site: Secondary | ICD-10-CM | POA: Diagnosis present

## 2019-10-10 DIAGNOSIS — I5032 Chronic diastolic (congestive) heart failure: Secondary | ICD-10-CM | POA: Diagnosis present

## 2019-10-10 DIAGNOSIS — G8929 Other chronic pain: Secondary | ICD-10-CM | POA: Diagnosis present

## 2019-10-10 DIAGNOSIS — I110 Hypertensive heart disease with heart failure: Secondary | ICD-10-CM | POA: Diagnosis present

## 2019-10-10 DIAGNOSIS — Z791 Long term (current) use of non-steroidal anti-inflammatories (NSAID): Secondary | ICD-10-CM | POA: Diagnosis not present

## 2019-10-10 DIAGNOSIS — R7303 Prediabetes: Secondary | ICD-10-CM | POA: Diagnosis present

## 2019-10-10 DIAGNOSIS — Z96652 Presence of left artificial knee joint: Secondary | ICD-10-CM | POA: Diagnosis present

## 2019-10-10 DIAGNOSIS — I10 Essential (primary) hypertension: Secondary | ICD-10-CM | POA: Diagnosis not present

## 2019-10-10 DIAGNOSIS — Z87891 Personal history of nicotine dependence: Secondary | ICD-10-CM | POA: Diagnosis not present

## 2019-10-10 DIAGNOSIS — J9611 Chronic respiratory failure with hypoxia: Secondary | ICD-10-CM | POA: Diagnosis present

## 2019-10-10 DIAGNOSIS — Z7982 Long term (current) use of aspirin: Secondary | ICD-10-CM | POA: Diagnosis not present

## 2019-10-10 LAB — CBC
HCT: 42.6 % (ref 36.0–46.0)
Hemoglobin: 13.3 g/dL (ref 12.0–15.0)
MCH: 28.1 pg (ref 26.0–34.0)
MCHC: 31.2 g/dL (ref 30.0–36.0)
MCV: 90.1 fL (ref 80.0–100.0)
Platelets: 289 10*3/uL (ref 150–400)
RBC: 4.73 MIL/uL (ref 3.87–5.11)
RDW: 17.2 % — ABNORMAL HIGH (ref 11.5–15.5)
WBC: 7.4 10*3/uL (ref 4.0–10.5)
nRBC: 0 % (ref 0.0–0.2)

## 2019-10-10 LAB — BASIC METABOLIC PANEL
Anion gap: 10 (ref 5–15)
BUN: 13 mg/dL (ref 6–20)
CO2: 23 mmol/L (ref 22–32)
Calcium: 8.4 mg/dL — ABNORMAL LOW (ref 8.9–10.3)
Chloride: 108 mmol/L (ref 98–111)
Creatinine, Ser: 1.02 mg/dL — ABNORMAL HIGH (ref 0.44–1.00)
GFR calc Af Amer: >60 mL/min (ref >60)
GFR calc non Af Amer: >60 mL/min (ref >60)
Glucose, Bld: 214 mg/dL — ABNORMAL HIGH (ref 70–99)
Potassium: 4.4 mmol/L (ref 3.5–5.1)
Sodium: 141 mmol/L (ref 135–145)

## 2019-10-10 LAB — CBG MONITORING, ED: Glucose-Capillary: 161 mg/dL — ABNORMAL HIGH (ref 70–99)

## 2019-10-10 LAB — GLUCOSE, CAPILLARY
Glucose-Capillary: 149 mg/dL — ABNORMAL HIGH (ref 70–99)
Glucose-Capillary: 175 mg/dL — ABNORMAL HIGH (ref 70–99)

## 2019-10-10 LAB — HIV ANTIBODY (ROUTINE TESTING W REFLEX): HIV Screen 4th Generation wRfx: NONREACTIVE

## 2019-10-10 LAB — SARS CORONAVIRUS 2 (TAT 6-24 HRS): SARS Coronavirus 2: NEGATIVE

## 2019-10-10 LAB — HEMOGLOBIN A1C
Hgb A1c MFr Bld: 6.3 % — ABNORMAL HIGH (ref 4.8–5.6)
Mean Plasma Glucose: 134.11 mg/dL

## 2019-10-10 MED ORDER — ALBUTEROL SULFATE (2.5 MG/3ML) 0.083% IN NEBU
2.5000 mg | INHALATION_SOLUTION | RESPIRATORY_TRACT | Status: DC | PRN
  Administered 2019-10-10: 2.5 mg via RESPIRATORY_TRACT
  Filled 2019-10-10: qty 3

## 2019-10-10 MED ORDER — INSULIN ASPART 100 UNIT/ML ~~LOC~~ SOLN
0.0000 [IU] | Freq: Three times a day (TID) | SUBCUTANEOUS | Status: DC
  Administered 2019-10-10: 1 [IU] via SUBCUTANEOUS

## 2019-10-10 MED ORDER — ALBUTEROL SULFATE (2.5 MG/3ML) 0.083% IN NEBU
2.5000 mg | INHALATION_SOLUTION | RESPIRATORY_TRACT | Status: AC
  Administered 2019-10-10 – 2019-10-11 (×6): 2.5 mg via RESPIRATORY_TRACT
  Filled 2019-10-10 (×6): qty 3

## 2019-10-10 MED ORDER — SALINE SPRAY 0.65 % NA SOLN
1.0000 | NASAL | Status: DC | PRN
  Administered 2019-10-10: 1 via NASAL
  Filled 2019-10-10: qty 44

## 2019-10-10 MED ORDER — ALBUTEROL SULFATE (2.5 MG/3ML) 0.083% IN NEBU: 2.5000 mg | INHALATION_SOLUTION | RESPIRATORY_TRACT | Status: DC

## 2019-10-10 NOTE — Progress Notes (Addendum)
[facility] PROGRESS NOTE  AMA MCMASTER WGN:[id number] DOB: 08-12-1966 DOA: 10/09/2019 PCP: [name], MD  Assessment/Plan:  1. severe asthma exacerbation. Presented with 3 days wheezing, cough, and SOB consistent with her prior asthma exacerbations. States she believes attending church outside was trigger.  CXR is clear. not much improvement this am. Tachypnea, tachycardia increased oxygen demand. sats 93% on 4L.   - Continue systemic steroids - continue nebs but change to scheduled. -contine ICS/LABA, and Singulair  -flutter valve   2. OSA on CPAP not usually compliant at home - CPAP qHS once COVID PCR confirmed to be negative    3. Hypertension high end of normal. Home meds include clonidine - Continue clonidine  -prn hydralazine   4. Chronic pain appears stable at baseline.  - No pain complaints on admission  - Continue home regimen    5. Hyperglycemia. Serum glucose 214 on admission. Likely related to steroids. (OP and now solumedrol) -SSI  -monitor    Code Status: full Family Communication: patient Disposition Plan: home   Consultants:   Procedures:   Antibiotics:   HPI/Subjective: Sitting up in bed. Complains of continued sob and cough. No acute distress  Objective: Vitals:   10/10/19 1030 10/10/19 1123  BP:    Pulse: (!) 101 100  Resp: 20 15  Temp:    SpO2: 93% 98%    Intake/Output Summary (Last 24 hours) at 10/10/2019 1141 Last data filed at 10/09/2019 2320 Gross per 24 hour  Intake 387.43 ml  Output --  Net 387.43 ml   Filed Weights   10/09/19 1746  Weight: 129.7 kg    Exam:  General:  Obese alert no acute distress Cardiovascular: tachycardia but regular no mgr no LE edema Respiratory: moderate increased work of breathing at rest. BS quite diminished but I do hear some movement on left base with diffuse expiratory wheeze. Right side much more diminished. No crackles Abdomen: obese soft +BS no guarding or  rebounding Musculoskeletal: joints without swelling/erythema  Data Reviewed: Basic Metabolic Panel: Recent Labs  Lab 10/09/19 1812 10/10/19 0250  NA 142 141  K 5.0 4.4  CL 108 108  CO2 28 23  GLUCOSE 117* 214*  BUN 13 13  CREATININE 0.92 1.02*  CALCIUM 8.6* 8.4*   Liver Function Tests: No results for input(s): AST, ALT, ALKPHOS, BILITOT, PROT, ALBUMIN in the last 168 hours. No results for input(s): LIPASE, AMYLASE in the last 168 hours. No results for input(s): AMMONIA in the last 168 hours. CBC: Recent Labs  Lab 10/09/19 1812 10/10/19 0250  WBC 7.8 7.4  NEUTROABS 4.0  --   HGB 13.6 13.3  HCT 43.7 42.6  MCV 90.9 90.1  PLT 301 289   Cardiac Enzymes: No results for input(s): CKTOTAL, CKMB, CKMBINDEX, TROPONINI in the last 168 hours. BNP (last 3 results) Recent Labs    12/24/18 1915 10/09/19 1812  BNP 46.9 40.0    ProBNP (last 3 results) No results for input(s): PROBNP in the last 8760 hours.  CBG: No results for input(s): GLUCAP in the last 168 hours.  Recent Results (from the past 240 hour(s))  SARS CORONAVIRUS 2 (TAT 6-24 HRS) Nasopharyngeal Nasopharyngeal Swab     Status: None   Collection Time: 10/09/19  9:04 PM   Specimen: Nasopharyngeal Swab  Result Value Ref Range Status   SARS Coronavirus 2 NEGATIVE NEGATIVE Final    Comment: (NOTE) SARS-CoV-2 target nucleic acids are NOT DETECTED. The SARS-CoV-2 RNA is generally detectable in upper and lower  respiratory specimens during the acute phase of infection. Negative results do not preclude SARS-CoV-2 infection, do not rule out co-infections with other pathogens, and should not be used as the sole basis for treatment or other patient management decisions. Negative results must be combined with clinical observations, patient history, and epidemiological information. The expected result is Negative. Fact Sheet for Patients: HairSlick.no Fact Sheet for Healthcare  Providers: quierodirigir.com This test is not yet approved or cleared by the [place] FDA and  has been authorized for detection and/or diagnosis of SARS-CoV-2 by FDA under an Emergency Use Authorization (EUA). This EUA will remain  in effect (meaning this test can be used) for the duration of the COVID-19 declaration under Section 56 4(b)(1) of the Act, 21 U.S.C. section 360bbb-3(b)(1), unless the authorization is terminated or revoked sooner. Performed at [facility], [address] [place], [place] [address]      Studies: DG Chest Port 1 View  Result Date: 10/09/2019 CLINICAL DATA:  Shortness of breath, wheezing EXAM: PORTABLE CHEST 1 VIEW COMPARISON:  09/01/2019 FINDINGS: Heart and mediastinal contours are within normal limits. No focal opacities or effusions. No acute bony abnormality. IMPRESSION: No active disease. Electronically Signed   By: Charlett Nose M.D.   On: 10/09/2019 18:23    Scheduled Meds:  albuterol  2.5 mg Nebulization Q4H   aspirin EC  81 mg Oral Daily   cloNIDine  0.1 mg Oral BID   enoxaparin (LOVENOX) injection  40 mg Subcutaneous Q24H   gabapentin  300 mg Oral BID   meloxicam  7.5 mg Oral Q breakfast   methylPREDNISolone (SOLU-MEDROL) injection  60 mg Intravenous Q6H   mometasone-formoterol  2 puff Inhalation BID   montelukast  10 mg Oral QHS   pantoprazole  40 mg Oral Q1200   sodium chloride flush  3 mL Intravenous Q12H   sodium chloride flush  3 mL Intravenous Q12H   Continuous Infusions:  sodium chloride      Principal Problem:   Acute-on-chronic respiratory failure (HCC) Active Problems:   Asthma exacerbation   Acute asthma exacerbation   Hyperglycemia   Morbid obesity (HCC)   Essential hypertension   OSA (obstructive sleep apnea)    Time spent: 45 minutes    [facility] M NP  [facility]  If 7PM-7AM, please contact night-coverage at [email or web address], password [email or web address] 10/10/2019, 11:41 AM  LOS:  0 days

## 2019-10-10 NOTE — ED Notes (Signed)
Pt given [place] sandwich pt dissatisfied with breakfast tray.

## 2019-10-10 NOTE — Telephone Encounter (Signed)
lmtcb X2 for pt.  

## 2019-10-11 DIAGNOSIS — R739 Hyperglycemia, unspecified: Secondary | ICD-10-CM

## 2019-10-11 LAB — GLUCOSE, CAPILLARY
Glucose-Capillary: 147 mg/dL — ABNORMAL HIGH (ref 70–99)
Glucose-Capillary: 126 mg/dL — ABNORMAL HIGH (ref 70–99)

## 2019-10-11 LAB — BASIC METABOLIC PANEL
Anion gap: 10 (ref 5–15)
BUN: 14 mg/dL (ref 6–20)
CO2: 26 mmol/L (ref 22–32)
Calcium: 8.6 mg/dL — ABNORMAL LOW (ref 8.9–10.3)
Chloride: 103 mmol/L (ref 98–111)
Creatinine, Ser: 0.87 mg/dL (ref 0.44–1.00)
GFR calc Af Amer: >60 mL/min (ref >60)
GFR calc non Af Amer: >60 mL/min (ref >60)
Glucose, Bld: 123 mg/dL — ABNORMAL HIGH (ref 70–99)
Potassium: 4.9 mmol/L (ref 3.5–5.1)
Sodium: 139 mmol/L (ref 135–145)

## 2019-10-11 MED ORDER — PREDNISONE 10 MG PO TABS: ORAL_TABLET | ORAL | 0 refills | Status: DC

## 2019-10-11 MED ORDER — MOMETASONE FURO-FORMOTEROL FUM 200-5 MCG/ACT IN AERO
2.0000 | INHALATION_SPRAY | Freq: Two times a day (BID) | RESPIRATORY_TRACT | Status: DC
  Administered 2019-10-11: 2 via RESPIRATORY_TRACT

## 2019-10-11 MED ORDER — GUAIFENESIN-DM 100-10 MG/5ML PO SYRP
5.0000 mL | ORAL_SOLUTION | ORAL | Status: DC | PRN
  Administered 2019-10-11: 5 mL via ORAL
  Filled 2019-10-11 (×2): qty 5

## 2019-10-11 MED ORDER — ALBUTEROL SULFATE HFA 108 (90 BASE) MCG/ACT IN AERS: 2.0000 | INHALATION_SPRAY | Freq: Four times a day (QID) | RESPIRATORY_TRACT | 5 refills | Status: DC | PRN

## 2019-10-11 MED ORDER — IPRATROPIUM BROMIDE 0.02 % IN SOLN: 0.5000 mg | Freq: Two times a day (BID) | RESPIRATORY_TRACT | 5 refills | Status: DC | PRN

## 2019-10-11 MED ORDER — DULERA 200-5 MCG/ACT IN AERO: 2.0000 | INHALATION_SPRAY | Freq: Two times a day (BID) | RESPIRATORY_TRACT | 5 refills | Status: DC

## 2019-10-11 MED ORDER — ALBUTEROL SULFATE (2.5 MG/3ML) 0.083% IN NEBU: 2.5000 mg | INHALATION_SOLUTION | Freq: Four times a day (QID) | RESPIRATORY_TRACT | 2 refills | Status: DC | PRN

## 2019-10-11 NOTE — Telephone Encounter (Signed)
Spoke with pt, since we last spoke with her pt was scheduled for a televisit with TP, who instructed pt to go to the ED.  Pt is currently admitted for asthma exacerbation.  Pt is requesting that her inhaled meds still be sent to pharmacy for pickup when she is discharged.  Previous documentation states that the meds were sent to pharmacy but chart shows that they have not been.  Will send meds now.  Nothing further needed at this time- will close encounter.

## 2019-10-11 NOTE — Progress Notes (Signed)
RN gave pt discharge instructions and pt stated understanding she did not have any questions, prescriptions escribed to pt home pharmacy.

## 2019-10-11 NOTE — Telephone Encounter (Signed)
Spoke with pt's husband and he states that the pt is not available. I advised him to have her call back. Will await return call.

## 2019-10-11 NOTE — Discharge Summary (Signed)
Physician Discharge Summary  Kathleen Rodriguez DDU:[id number] DOB: 1966-09-10  PCP: Hoy Register, MD  Admitted from: Home Discharged to: Home  Admit date: 10/09/2019 Discharge date: 10/11/2019  Recommendations for Outpatient Follow-up:   Follow-up Information    Hoy Register, MD. Schedule an appointment as soon as possible for a visit in 1 week(s).   Specialty: Family Medicine Why: To be seen with repeat labs (CBC & BMP). Contact information: [address] [place] [address] [phone]        [name], MD Follow up on 10/15/2019.   Specialty: Pulmonary Disease Why: 9:15 am. Please keep prior appointment. Contact information: [address] [place] [address] [phone]            Home Health: None Equipment/Devices: None  Discharge Condition: Improved and stable CODE STATUS: Full Diet recommendation: Heart healthy and diabetic diet.  Discharge Diagnoses:  Principal Problem:   Acute-on-chronic respiratory failure (HCC) Active Problems:   OSA (obstructive sleep apnea)   Asthma exacerbation   Morbid obesity (HCC)   Essential hypertension   Acute asthma exacerbation   Hyperglycemia   Brief Summary: 53 year old married female, has small kids at home with the youngest one 53 years old, PMH of severe persistent asthma, prior intubation and mechanical ventilation, OSA not fully compliant with CPAP, arthritis, chronic diastolic CHF, cocaine and tobacco abuse in remission, GERD, HTN not on medications, presented to the ED due to cough, progressive dyspnea and wheezing that started 3 days prior to admission and continued to worsen despite using home breathing treatments.  She denied fever, chills, chest pain, leg swelling or tenderness.  She reported that the symptoms were consistent with her prior asthma exacerbations.  She called EMS and was treated with IV Solu-Medrol, albuterol and Atrovent prior to arrival to the ED.  In the ED,  she was afebrile, saturating at 100% on 4 L/min of supplemental oxygen, mildly hypertensive, chest x-ray negative for acute process, lab work were unremarkable.  She was admitted for evaluation and management of acute asthma exacerbation.  Assessment and plan:  1. Acute asthma exacerbation.  History as noted above.  Chest x-ray negative for acute process.  No clinical suspicion for infectious process or VTE.  Treated by EMS with albuterol, Atrovent and Solu-Medrol.  Treated in the ED with IV magnesium and albuterol MDI.  Patient believes that attending church outside was her trigger.  She was treated with IV Solu-Medrol, bronchodilator nebulizations, Singulair, Dulera and flutter valve.  Oxygen supplemented as needed.  She made gradual improvement and indicates today that she is almost back to the baseline.  She still has some dyspnea on exertion i.e. dyspnea after having a bath.  However she states that she has had chronic dyspnea on exertion of this degree for the last 6 to 7 months.  Advised her that it would be better if she stayed another day in the hospital for continued IV steroids, close monitoring for continued improvement and stability prior to safe discharge home.  Patient however insists that she wishes to be discharged home today because she feels almost back to her usual.  She also states that she has a young 79 year old son at home and is fearful of leaving him unattended at home.  She has a follow-up with her pulmonologist/Dr. Craige Cotta Tuesday of next week which she plans to keep.  She states that she has been on on and off prednisone for more than a month now causing her to eat more  and weight gain of about 10 pounds which may be contributing to her dyspnea on exertion.  She will be discharged on oral prednisone taper as below and continue prior home respiratory medications as indicated below.  Of note it appears that patient has communicated with her pulmonologist office who are filling some of  these medications today.  Patient has been advised to seek immediate medical attention if there is any deterioration in her condition and she verbalizes understanding.  Covid testing negative.  Clinically appears euvolemic. 2. OSA on CPAP: Not fully compliant at home.  Advised importance of using it consistently. 3. Essential hypertension: Reasonably controlled.  Continue prior home dose of clonidine. 4. Arthritis/chronic pain: Continue prior home regimen. 5. Prediabetes/glucose intolerance: A1c 6.3.  At risk for diabetes.  Recommended diet, exercise and weight loss.  Will need to minimize steroid use.  Patient aware. 6. Morbid obesity/Body mass index is 47.59 kg/m.:  Patient needs to lose weight. 7. GERD: Continue PPI.  No overt reflux symptoms, LPR symptoms or stridor.     Consultations:  None  Procedures:  None   Discharge Instructions  Discharge Instructions    Call MD for:  difficulty breathing, headache or visual disturbances   Complete by: As directed    Call MD for:  extreme fatigue   Complete by: As directed    Call MD for:  persistant dizziness or light-headedness   Complete by: As directed    Call MD for:  persistant nausea and vomiting   Complete by: As directed    Call MD for:  severe uncontrolled pain   Complete by: As directed    Call MD for:  temperature >100.4   Complete by: As directed    Diet - low sodium heart healthy   Complete by: As directed    Diet Carb Modified   Complete by: As directed    Discharge instructions   Complete by: As directed    Please use your CPAP at night consistently.   Increase activity slowly   Complete by: As directed        Medication List    TAKE these medications   acetaminophen 500 MG tablet Commonly known as: TYLENOL Take 500 mg by mouth every 6 (six) hours as needed for mild pain.   AeroChamber MV inhaler Use as instructed   albuterol 108 (90 Base) MCG/ACT inhaler Commonly known as: VENTOLIN HFA Inhale 2  puffs into the lungs every 6 (six) hours as needed for wheezing or shortness of breath.   albuterol (2.5 MG/3ML) 0.083% nebulizer solution Commonly known as: PROVENTIL Take 3 mLs (2.5 mg total) by nebulization every 6 (six) hours as needed for wheezing or shortness of breath.   aspirin 81 MG EC tablet Take 1 tablet (81 mg total) by mouth daily.   cloNIDine 0.1 MG tablet Commonly known as: CATAPRES Take 1 tablet (0.1 mg total) by mouth 2 (two) times daily.   Dulera 200-5 MCG/ACT Aero Generic drug: mometasone-formoterol Inhale 2 puffs into the lungs 2 (two) times daily.   fluticasone 50 MCG/ACT nasal spray Commonly known as: FLONASE Place 1 spray into both nostrils daily. What changed:   when to take this  reasons to take this   gabapentin 300 MG capsule Commonly known as: NEURONTIN Take 1 capsule (300 mg total) by mouth 2 (two) times daily.   HALLS COUGH DROPS MT Use as directed 1 drop in the mouth or throat 2 (two) times daily as needed (sore throat and cough).  ipratropium 0.02 % nebulizer solution Commonly known as: ATROVENT Take 2.5 mLs (0.5 mg total) by nebulization 2 (two) times daily as needed for wheezing or shortness of breath.   loratadine 10 MG tablet Commonly known as: CLARITIN Take 1 tablet (10 mg total) by mouth daily.   meloxicam 7.5 MG tablet Commonly known as: MOBIC Take 1 tablet (7.5 mg total) by mouth daily.   methocarbamol 500 MG tablet Commonly known as: ROBAXIN TAKE 1 TABLET (500 MG TOTAL) BY MOUTH EVERY 8 (EIGHT) HOURS AS NEEDED FOR MUSCLE SPASMS.   Misc. Devices Misc Blood pressure monitor  Dx: Hypertension   montelukast 10 MG tablet Commonly known as: SINGULAIR Take 1 tablet (10 mg total) by mouth at bedtime.   pantoprazole 40 MG tablet Commonly known as: PROTONIX Take 1 tablet (40 mg total) by mouth daily at 12 noon.   predniSONE 10 MG tablet Commonly known as: DELTASONE Take 5 tabs daily for 2 days, then 4 tabs daily for 2  days, then 3 tabs daily for 2 days, then 2 tabs daily for 2 days, then 1 tab daily for 2 days, then stop. Start taking on: October 12, 2019   traMADol 50 MG tablet Commonly known as: ULTRAM Take 1 tablet (50 mg total) by mouth every 6 (six) hours as needed. What changed: reasons to take this   urea 20 % cream Commonly known as: CARMOL Apply topically as needed. What changed:   how much to take  when to take this  reasons to take this   VICKS VAPORUB EX Apply 1 application topically daily as needed (congestion).      Allergies  Allergen Reactions  . Tomato Hives, Itching and Other (See Comments)    ALSO REACTS TO KETCHUP  . Latex Itching and Rash  . Wool Alcohol [Lanolin] Itching      Procedures/Studies: DG Chest Port 1 View  Result Date: 10/09/2019 CLINICAL DATA:  Shortness of breath, wheezing EXAM: PORTABLE CHEST 1 VIEW COMPARISON:  09/01/2019 FINDINGS: Heart and mediastinal contours are within normal limits. No focal opacities or effusions. No acute bony abnormality. IMPRESSION: No active disease. Electronically Signed   By: Charlett NoseKevin  Dover M.D.   On: 10/09/2019 18:23      Subjective: Overall patient feels much better.  Dyspnea significantly improved.  Anxious to get back home to her 54 year old son whom she is worried about.  Indicates that her breathing is almost back to baseline.  Has chronic dyspnea on exertion of several months duration.  No chest pain or cough.  As per RN, no acute issues reported.  Discharge Exam:  Vitals:   10/11/19 0348 10/11/19 0527 10/11/19 0700 10/11/19 0757  BP:  132/77  (!) 141/72  Pulse:  87 92 93  Resp:  17 (S) (!) 26 20  Temp:    97.8 F (36.6 C)  TempSrc:    Oral  SpO2: 98% 97% 96% 95%  Weight:      Height:        General: Pleasant young female, moderately built and morbidly obese sitting up comfortably in bed without distress. Cardiovascular: S1 & S2 heard, RRR, S1/S2 +. No murmurs, rubs, gallops or clicks. No JVD or  pedal edema.  Telemetry personally reviewed: Sinus rhythm. Respiratory: Clear to auscultation anteriorly.  Very occasional rhonchi posteriorly.  Otherwise clear to auscultation.  No increased work of breathing.  Able to speak in full sentences. Abdominal:  Non distended, non tender & soft. No organomegaly or masses appreciated. Normal bowel  sounds heard. CNS: Alert and oriented. No focal deficits. Extremities: no edema, no cyanosis    The results of significant diagnostics from this hospitalization (including imaging, microbiology, ancillary and laboratory) are listed below for reference.     Microbiology: Recent Results (from the past 240 hour(s))  SARS CORONAVIRUS 2 (TAT 6-24 HRS) Nasopharyngeal Nasopharyngeal Swab     Status: None   Collection Time: 10/09/19  9:04 PM   Specimen: Nasopharyngeal Swab  Result Value Ref Range Status   SARS Coronavirus 2 NEGATIVE NEGATIVE Final    Comment: (NOTE) SARS-CoV-2 target nucleic acids are NOT DETECTED. The SARS-CoV-2 RNA is generally detectable in upper and lower respiratory specimens during the acute phase of infection. Negative results do not preclude SARS-CoV-2 infection, do not rule out co-infections with other pathogens, and should not be used as the sole basis for treatment or other patient management decisions. Negative results must be combined with clinical observations, patient history, and epidemiological information. The expected result is Negative. Fact Sheet for Patients: HairSlick.no Fact Sheet for Healthcare Providers: quierodirigir.com This test is not yet approved or cleared by the [place] FDA and  has been authorized for detection and/or diagnosis of SARS-CoV-2 by FDA under an Emergency Use Authorization (EUA). This EUA will remain  in effect (meaning this test can be used) for the duration of the COVID-19 declaration under Section 56 4(b)(1) of the Act, 21  U.S.C. section 360bbb-3(b)(1), unless the authorization is terminated or revoked sooner. Performed at [facility], [address], [place], [place] [address]      Labs: CBC: Recent Labs  Lab 10/09/19 1812 10/10/19 0250  WBC 7.8 7.4  NEUTROABS 4.0  --   HGB 13.6 13.3  HCT 43.7 42.6  MCV 90.9 90.1  PLT 301 289    Basic Metabolic Panel: Recent Labs  Lab 10/09/19 1812 10/10/19 0250 10/11/19 0330  NA 142 141 139  K 5.0 4.4 4.9  CL 108 108 103  CO2 GLUCOSE 117* 214* 123*  BUN CREATININE 0.92 1.02* 0.87  CALCIUM 8.6* 8.4* 8.6*    Liver Function Tests: No results for input(s): AST, ALT, ALKPHOS, BILITOT, PROT, ALBUMIN in the last 168 hours.  CBG: Recent Labs  Lab 10/10/19 1235 10/10/19 1713 10/10/19 2140 10/11/19 0527 10/11/19 1128  GLUCAP 161* 149* 175* 147* 126*    Hgb A1c Recent Labs    10/10/19 1700  HGBA1C 6.3*      Time coordinating discharge: 25 minutes  SIGNED:  Marcellus Scott, MD, FACP, [facility]. [facility]  To contact the attending provider between 7A-7P or the covering provider during after hours 7P-7A, please log into the web site [email or web address] and access using universal [place] password for that web site. If you do not have the password, please call the hospital operator.

## 2019-10-11 NOTE — Discharge Instructions (Signed)

## 2019-10-14 ENCOUNTER — Telehealth: Payer: Self-pay | Admitting: Pulmonary Disease

## 2019-10-14 ENCOUNTER — Telehealth: Payer: Self-pay

## 2019-10-14 DIAGNOSIS — R21 Rash and other nonspecific skin eruption: Secondary | ICD-10-CM

## 2019-10-14 NOTE — Telephone Encounter (Signed)
Spoke with patient. She is aware to start the 10mg  daily starting tomorrow. Advised her to call us back before 12pm on Thursday if she has not noticed a difference in her symptoms. She verbalized understanding.   She wanted to know if she should keep her appointment tomorrow for her injection. She stated that she is not feeling like herself and thinks it is best for her wait until she calms down from the prednisone. Advised her that I would send a message to the injection pool to advise them of this, she verbalized understanding.   Nothing further needed at time of call.

## 2019-10-14 NOTE — Telephone Encounter (Signed)
Dr Margarita Rana -   From the discharge call.  The patient has an appointment with you 10/30/2019 and it is noted as a PAP.      She said that she remains short of breath but is feeling " okay."    She contacted the pulmonologist today because she was having a reaction to the prednisone and said she was instructed to decrease the prednisone to 10 mg daily x 5 days. She said that she was instructed to cancel the appointment for the fasenra injection tomorrow - 10/15/2019 due to the adverse reaction that she experienced.  She explained that she had become very anxious and said that pulmonary would call her to reschedule the injection appt.   She also noted that she may have a yeast infection.  She said that there is no discharge but she is experiencing itching and a " smell down there."  She feels this is a result of the prednisone. Informed her that Dr Margarita Rana would be notified.    She is also requesting a refill of the  Urea cream for her feet.  No open areas on her feet reported  She also noted that she is very anxious about being around other people due to the possibility of exposing herself to illness.

## 2019-10-14 NOTE — Telephone Encounter (Signed)
Transition Care Management Follow-up Telephone Call  Date of discharge and from where: 10/11/2019, [facility]   How have you been since you were released from the hospital? She said that she remains short of breath but is feeling " okay."   Any questions or concerns? She contacted the pulmonologist today because she was having a reaction to the prednisone and said she was instructed to decrease the prednisone to 10 mg daily x 5 days. She said that she was instructed to cancel the appointment for the fasenra injection tomorrow - 10/15/2019 due to the adverse reaction that she experienced.  She explained that she had become very anxious and said that pulmonary would call her to reschedule the injection appt.   She also noted that she may have a yeast infection.  She said that there is no discharge but she is experiencing itching and a " smell down there."  She feels this is a result of the prednisone. Informed her that Dr Margarita Rana would be notified.    She is also requesting a refill of the  Urea cream for her feet.  No open areas on her feet reported  She also noted that she is very anxious about being around other people due to the possibility of exposing herself to illness.   Items Reviewed:  Did the pt receive and understand the discharge instructions provided? Yes she has the instructions and has no questions.   Medications obtained and verified? She said that she has her medications and has no questions and did not need to review the list. Her concerns about her medications are noted above.   Any new allergies since your discharge? She contacted pulmonologist today about a questionable reaction to prednisone.   Do you have support at home? She lives with her 2 sons - 68 yo and 40 yo.   Other (ie: DME, Home Health, etc) no home health ordered.  Has a nebulizer.    Functional Questionnaire: (I = Independent and D = Dependent) ADL's:independent  Follow up appointments  reviewed:    PCP Hospital f/u appt confirmed? 10/30/2019 @ 0910 with Dr Margarita Rana.  She did not want to schedule an appt any sooner.   [facility] f/u appt confirmed? She said that pulmonary would call her to re-schedule the cancelled appt.   Are transportation arrangements needed?she needs to contact medicaid about transportation    If their condition worsens, is the pt aware to call  their PCP or go to the ED? yes  Was the patient provided with contact information for the PCP's office or ED?  Yes  Was the pt encouraged to call back with questions or concerns?yes

## 2019-10-14 NOTE — Telephone Encounter (Signed)
Try 10mg  daily x 5 days. If symptoms continue please stop. Recommend using Albuterol nebulizer every 6 hours for the next couple of days as well. If she experiences increased asthma symptoms notify office. She has apt with our office for injection on 12/22.

## 2019-10-14 NOTE — Telephone Encounter (Signed)
Patient was scheduled for her [name] injection tomorrow (10/14/19) at 915am. She had called stating that she was an reaction to her prednisone and is not feeling like herself. She has requested to cancel her injection appointment. She wants to wait until after she has calmed down from the prednisone. Advised her that I would let the injection department know, she verbalized understanding.

## 2019-10-14 NOTE — Telephone Encounter (Signed)
Spoke with patient. She stated that she was diagnosed from the hospital on 10/11/19 on a prednisone taper. She has been on prednisone tapers before with no problems. She took the 50mg  yesterday and noticed some side effects. She is seeing things that are not there. She is also having sensations of something crawling on her. Her vision has also gotten blurry and she feels like her heart is about to pound out of her chest.   Because of this, she only took 20mg  today and she is still having these sensations. She wants to know what should she do.   Beth, please advise since VS is not here today.

## 2019-10-15 ENCOUNTER — Ambulatory Visit: Payer: Medicaid Other

## 2019-10-15 MED ORDER — UREA 20 % EX CREA: 1.0000 "application " | TOPICAL_CREAM | Freq: Every day | CUTANEOUS | 1 refills | Status: DC | PRN

## 2019-10-15 MED ORDER — FLUCONAZOLE 150 MG PO TABS: 150.0000 mg | ORAL_TABLET | Freq: Once | ORAL | 0 refills | Status: AC

## 2019-10-15 NOTE — Telephone Encounter (Signed)
It is either a transitional care follow-up or a Pap smear; it cannot be both.  I have refilled her urea cream.

## 2019-10-15 NOTE — Telephone Encounter (Signed)
Noted  

## 2019-10-15 NOTE — Telephone Encounter (Signed)
It will be a hospital follow-up.  She will need to reschedule her Pap smear.

## 2019-10-15 NOTE — Telephone Encounter (Signed)
Done

## 2019-10-16 NOTE — Telephone Encounter (Signed)
I sent this rx on 10/15/19 to [facility] [address] Pls see chart review under meds. Thx

## 2019-10-16 NOTE — Telephone Encounter (Signed)
I see it now.  It was not on the snapshot and was not listed under current medications. Thank you.   Call placed to patient and she said that she had already contacted her pharmacy and knows that its ready.

## 2019-10-26 ENCOUNTER — Other Ambulatory Visit: Payer: Self-pay | Admitting: Family Medicine

## 2019-10-30 ENCOUNTER — Ambulatory Visit: Payer: Medicaid Other | Attending: Family Medicine | Admitting: Family Medicine

## 2019-10-30 ENCOUNTER — Other Ambulatory Visit: Payer: Self-pay

## 2019-10-30 ENCOUNTER — Encounter: Payer: Self-pay | Admitting: Gastroenterology

## 2019-10-30 DIAGNOSIS — Z872 Personal history of diseases of the skin and subcutaneous tissue: Secondary | ICD-10-CM | POA: Diagnosis not present

## 2019-10-30 DIAGNOSIS — R7303 Prediabetes: Secondary | ICD-10-CM | POA: Insufficient documentation

## 2019-10-30 DIAGNOSIS — G629 Polyneuropathy, unspecified: Secondary | ICD-10-CM | POA: Diagnosis not present

## 2019-10-30 DIAGNOSIS — M545 Low back pain, unspecified: Secondary | ICD-10-CM

## 2019-10-30 DIAGNOSIS — Z1211 Encounter for screening for malignant neoplasm of colon: Secondary | ICD-10-CM

## 2019-10-30 DIAGNOSIS — B373 Candidiasis of vulva and vagina: Secondary | ICD-10-CM

## 2019-10-30 DIAGNOSIS — B3731 Acute candidiasis of vulva and vagina: Secondary | ICD-10-CM

## 2019-10-30 MED ORDER — METHOCARBAMOL 500 MG PO TABS: 500.0000 mg | ORAL_TABLET | Freq: Three times a day (TID) | ORAL | 1 refills | Status: DC | PRN

## 2019-10-30 MED ORDER — FLUCONAZOLE 150 MG PO TABS: 150.0000 mg | ORAL_TABLET | Freq: Once | ORAL | 6 refills | Status: AC

## 2019-10-30 MED ORDER — ACCU-CHEK GUIDE ME W/DEVICE KIT: 1.0000 | PACK | Freq: Every day | 0 refills | Status: DC

## 2019-10-30 MED ORDER — ACCU-CHEK GUIDE VI STRP: ORAL_STRIP | 12 refills | Status: DC

## 2019-10-30 MED ORDER — METFORMIN HCL 500 MG PO TABS: 500.0000 mg | ORAL_TABLET | Freq: Two times a day (BID) | ORAL | 6 refills | Status: DC

## 2019-10-30 MED ORDER — GABAPENTIN 300 MG PO CAPS: 300.0000 mg | ORAL_CAPSULE | Freq: Two times a day (BID) | ORAL | 2 refills | Status: DC

## 2019-10-30 NOTE — Progress Notes (Signed)
Patient has been called and DOB has been verified. Patient has been screened and transferred to PCP to start phone visit.     

## 2019-10-30 NOTE — Progress Notes (Signed)
Virtual Visit via Telephone Note  I connected with [name], on 10/30/2019 at 9:39 AM by telephone due to the COVID-19 pandemic and verified that I am speaking with the correct person using two identifiers.   Consent: I discussed the limitations, risks, security and privacy concerns of performing an evaluation and management service by telephone and the availability of in person appointments. I also discussed with the patient that there may be a patient responsible charge related to this service. The patient expressed understanding and agreed to proceed.   Location of Patient: Home  Location of Provider: Clinic   Persons participating in Telemedicine visit: Nekayla Heider Farrington-CMA Dr. Margarita Rana     History of Present Illness: Kathleen Rodriguez is a 54 year old female with a history of asthma, obstructive sleep apnea (on CPAP), bilateral knee osteoarthritis (status post left total knee arthroplasty in 08/2016) seen today for an follow up visit   She had an Asthma flare after being outside for [facility] service which led to her most recent hospitalization at [facility] from 10/09/19-10/11/19. Treated with solumedrol, Nebulizers, magnesium sulfate, Oxygen with improvement in her condition. Chest xray was negative for acute cardiopulmonary process.  She is yet to see Pulmonary for a follow up but has been recently commenced on [place] for her Asthma. Her recurrent asthma flares started in 2008 after she moved to [facility] in 2005 from [place]. [name] she feels fine and has no dyspnea or wheezing.  Her lower back hurts and is rated as  9/10 and has been hurting for 1 week . 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  Past Medical History:  Diagnosis Date  . Arthritis    "knees, lower back; legs, ankles" (01/27/2016)  . Asthma    followed by Dr. Halford Chessman  . CHF (congestive heart failure) ([place]) 2016   "when I went into a coma"  . Cocaine abuse ([place])   . 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 Dyspnea   . GERD (gastroesophageal reflux disease)   . Hypertension    "doctor took me off RX in 2016" (01/27/2016)  . Influenza B April 8891   Complicated by multi-organ failure  . OSA on CPAP "since " 03/20/2013  . Pneumonia 2016  . Required emergent intubation    asthma exacerbation in 2016  . Tobacco abuse   . Upper airway cough syndrome    Allergies  Allergen Reactions  . Tomato Hives, Itching and Other (See Comments)    ALSO REACTS TO KETCHUP  . Latex Itching and Rash  . Wool Alcohol [Lanolin] Itching    Current Outpatient Medications on File Prior to Visit  Medication Sig Dispense Refill  . albuterol (PROVENTIL) (2.5 MG/3ML) 0.083% nebulizer solution Take 3 mLs (2.5 mg total) by nebulization every 6 (six) hours as needed for wheezing or shortness of breath. 360 mL 2  . albuterol (VENTOLIN HFA) 108 (90 Base) MCG/ACT inhaler Inhale 2 puffs into the lungs every 6 (six) hours as needed for wheezing or shortness of breath. 6.7 g 5  . aspirin 81 MG EC tablet Take 1 tablet (81 mg total) by mouth daily. 30 tablet 3  . Camphor-Eucalyptus-Menthol (VICKS VAPORUB EX) Apply  1 application topically daily as needed (congestion).    . cloNIDine (CATAPRES) 0.1 MG tablet Take 1 tablet (0.1 mg total) by mouth 2 (two) times daily. Must keep office visit tomorrow for additional refills. 60 tablet 0  . fluticasone (FLONASE) 50 MCG/ACT nasal spray Place 1 spray into both nostrils daily. (Patient taking differently: Place 1 spray into both nostrils daily as needed for allergies. ) 16 g 5  . gabapentin (NEURONTIN) 300 MG capsule Take 1 capsule (300 mg total) by mouth 2 (two) times daily. 60 capsule 2  . ipratropium  (ATROVENT) 0.02 % nebulizer solution Take 2.5 mLs (0.5 mg total) by nebulization 2 (two) times daily as needed for wheezing or shortness of breath. 360 mL 5  . loratadine (CLARITIN) 10 MG tablet Take 1 tablet (10 mg total) by mouth daily. 30 tablet 11  . meloxicam (MOBIC) 7.5 MG tablet Take 1 tablet (7.5 mg total) by mouth daily. 30 tablet 1  . Menthol (HALLS COUGH DROPS MT) Use as directed 1 drop in the mouth or throat 2 (two) times daily as needed (sore throat and cough).    . methocarbamol (ROBAXIN) 500 MG tablet TAKE 1 TABLET (500 MG TOTAL) BY MOUTH EVERY 8 (EIGHT) HOURS AS NEEDED FOR MUSCLE SPASMS. 60 tablet 1  . Misc. Devices MISC Blood pressure monitor  Dx: Hypertension 1 each 0  . mometasone-formoterol (DULERA) 200-5 MCG/ACT AERO Inhale 2 puffs into the lungs 2 (two) times daily. 8.8 g 5  . montelukast (SINGULAIR) 10 MG tablet Take 1 tablet (10 mg total) by mouth at bedtime. 90 tablet 1  . pantoprazole (PROTONIX) 40 MG tablet Take 1 tablet (40 mg total) by mouth daily at 12 noon. 30 tablet 3  . Spacer/Aero-Holding Chambers (AEROCHAMBER MV) inhaler Use as instructed 1 each 2  . traMADol (ULTRAM) 50 MG tablet Take 1 tablet (50 mg total) by mouth every 6 (six) hours as needed. (Patient taking differently: Take 50 mg by mouth every 6 (six) hours as needed for moderate pain. ) 10 tablet 0  . urea (CARMOL) 20 % cream Apply 1 application topically daily as needed (For dry feet). 85 g 1  . acetaminophen (TYLENOL) 500 MG tablet Take 500 mg by mouth every 6 (six) hours as needed for mild pain.    . predniSONE (DELTASONE) 10 MG tablet Take 5 tabs daily for 2 days, then 4 tabs daily for 2 days, then 3 tabs daily for 2 days, then 2 tabs daily for 2 days, then 1 tab daily for 2 days, then stop. (Patient not taking: Reported on 10/30/2019) 30 tablet 0   No current facility-administered medications on file prior to visit.    Observations/Objective: Awake, alert, oriented x3 Not in acute distress  Lab  Results  Component Value Date   HGBA1C 6.3 (H) 10/10/2019    Assessment and Plan: Alert, awake, oriented x3 Not in acute distress  Follow Up Instructions: 1. Neuropathy Stable - methocarbamol (ROBAXIN) 500 MG tablet; Take 1 tablet (500 mg total) by mouth every 8 (eight) hours as needed for muscle spasms.  Dispense: 60 tablet; Refill: 1 - gabapentin (NEURONTIN) 300 MG capsule; Take 1 capsule (300 mg total) by mouth 2 (two) times daily.  Dispense: 60 capsule; Refill: 2  2. Acute midline low back pain without sciatica Musculoskeletal Weight gain likely contributory - advised weight loss will be beneficial Commenced Methocarbamol  3. Prediabetes A1c of 6.3 Due to recurrent steroid use Commence Metformin Counseled on Diabetic diet and lifestyle  modifications - metFORMIN (GLUCOPHAGE) 500 MG tablet; Take 1 tablet (500 mg total) by mouth 2 (two) times daily with a meal. Dx; Prediabetes  Dispense: 60 tablet; Refill: 6 - Blood Glucose Monitoring Suppl (ACCU-CHEK GUIDE ME) w/Device KIT; 1 each by Does not apply route daily. Dx; Prediabetets; Prednisone use  Dispense: 1 kit; Refill: 0 - glucose blood (ACCU-CHEK GUIDE) test strip; Use as instructed daily before breakfast. Dx; Prediabetets; Prednisone use  Dispense: 30 each; Refill: 12  4. Screening for colon cancer - Ambulatory referral to Gastroenterology  5. Morbid obesity ([place]) Outdoor exercise limited as Asthma flares are triggered by outdoor activity Counseled on reducing caloric intake - Amb Referral to Bariatric Surgery  6. Recurrent candidiasis of vagina Secondary to recurrent steroid use - fluconazole (DIFLUCAN) 150 MG tablet; Take 1 tablet (150 mg total) by mouth once for 1 dose. Then repeat in 72 hrs; to be used with Prednsione  Dispense: 2 tablet; Refill: 6    I discussed the assessment and treatment plan with the patient. The patient was provided an opportunity to ask questions and all were answered. The patient agreed with  the plan and demonstrated an understanding of the instructions.   The patient was advised to call back or seek an in-person evaluation if the symptoms worsen or if the condition fails to improve as anticipated.     I provided 23 minutes total of non-face-to-face time during this encounter including median intraservice time, reviewing previous notes, labs, imaging, medications, management and patient verbalized understanding.     [name], MD, FAAFP. [facility] and [place], [place]   10/30/2019, 9:39 AM

## 2019-10-31 ENCOUNTER — Encounter: Payer: Self-pay | Admitting: Family Medicine

## 2019-11-04 ENCOUNTER — Telehealth: Payer: Self-pay | Admitting: Family Medicine

## 2019-11-04 NOTE — Telephone Encounter (Signed)
Pt called to request advice on her foot pain, she was seen on 10/30/19 and states it hurts a lot  today. Please advise

## 2019-11-05 ENCOUNTER — Telehealth: Payer: Self-pay | Admitting: *Deleted

## 2019-11-05 NOTE — Telephone Encounter (Signed)
She can have her procedure in the [facility].  Please contact her and ask her to have 2 puffs of her albuterol inhaler just prior to coming into the [facility] that day, and also bring her albuterol inhaler with her.

## 2019-11-05 NOTE — Telephone Encounter (Signed)
noted 

## 2019-11-05 NOTE — Telephone Encounter (Signed)
Dr. Myrtie Neither, Kathleen Rodriguez,  Would you review this pt's chart?  She does have a respiratory history, among other issues.  Is she ok for [facility]?  Thanks, [employer]

## 2019-11-06 NOTE — Telephone Encounter (Signed)
Kristen,  This pt is cleared for anesthetic care at [facility].  Thanks,  Dick Hark 

## 2019-11-08 ENCOUNTER — Telehealth: Payer: Self-pay | Admitting: Pulmonary Disease

## 2019-11-08 NOTE — Telephone Encounter (Signed)
Called and spoke with Patient.  Patient scheduled 11/12/19 at 1000 for first fasenra injection.  Patient stated she has received her epipen and will bring to all injection appointment.  Patient is aware of 2 hour wait for monitoring after first injection.  Nothing further at this time.

## 2019-11-08 NOTE — Telephone Encounter (Signed)
Spoke with the pt  She is asking if she should receive the covid vaccine  I advised that with her asthma she does qualify for this  She states needing a letter stating that she has asthma so that she can receive the vaccine  I advised per CDC guidelines that at this time she does not qualify bc it is not time for her age group to receive the vaccine  I encouraged her visit the CDC website and [place] website for further information on this  She disagreed and again requested a letter stating condition and need for vaccine  Dr Craige Cotta, please advise, thanks!

## 2019-11-12 ENCOUNTER — Encounter: Payer: Self-pay | Admitting: Pulmonary Disease

## 2019-11-12 ENCOUNTER — Ambulatory Visit: Payer: Medicaid Other

## 2019-11-12 NOTE — Telephone Encounter (Signed)
Letter has been retrieved from Dr. Craige Cotta. It has been placed up front for pick up.  LMTCB x1 for pt.

## 2019-11-12 NOTE — Telephone Encounter (Signed)
I have printed a letter for her. 

## 2019-11-14 NOTE — Telephone Encounter (Signed)
Spoke with pt, she would like the letter mailed to her. Will mail letter to pt.

## 2019-11-19 ENCOUNTER — Ambulatory Visit: Payer: Medicaid Other

## 2019-11-26 ENCOUNTER — Telehealth: Payer: Self-pay | Admitting: Family Medicine

## 2019-11-26 ENCOUNTER — Encounter: Payer: Medicaid Other | Admitting: Gastroenterology

## 2019-11-26 DIAGNOSIS — G629 Polyneuropathy, unspecified: Secondary | ICD-10-CM

## 2019-11-26 NOTE — Telephone Encounter (Signed)
Patient called requesting for her PCP to increase the dosage of her Gabapentin because she states that the current dosage is not helping her. Patient states she has to walk with her walker because she can not apply pressure on her feet because of the pain she is feeling. Please f/u

## 2019-11-27 ENCOUNTER — Telehealth: Payer: Self-pay

## 2019-11-27 NOTE — Telephone Encounter (Signed)
Will route to PCP for review. 

## 2019-11-27 NOTE — Telephone Encounter (Signed)
Pt had missed pv this AM, lm for pt to call, pt states her uncle just had a stroke and she needs to cancel her pv and colonoscopy and she will call back when she is able to reschedule.

## 2019-11-28 MED ORDER — GABAPENTIN 300 MG PO CAPS: 600.0000 mg | ORAL_CAPSULE | Freq: Two times a day (BID) | ORAL | 3 refills | Status: DC

## 2019-11-28 NOTE — Telephone Encounter (Signed)
Dose has been increased 

## 2019-11-28 NOTE — Telephone Encounter (Signed)
Patient was called and informed of medication increase and new medication being sent to pharmacy.

## 2019-12-04 ENCOUNTER — Telehealth: Payer: Self-pay | Admitting: Family Medicine

## 2019-12-04 DIAGNOSIS — M79672 Pain in left foot: Secondary | ICD-10-CM

## 2019-12-04 NOTE — Telephone Encounter (Signed)
I have ordered a uric acid level for her and she can schedule an appointment to have this drawn.

## 2019-12-04 NOTE — Telephone Encounter (Signed)
Patient is still having foot pain and would like to get checked for Gout.

## 2019-12-04 NOTE — Telephone Encounter (Signed)
Patient stated that her Gabapentin is not working. Patient stated that she is having pain in her Left foot is still currently in pain. Patient stated that she did some research on the internet and believes she may have gout. Please fu at your earliest convenience.

## 2019-12-05 NOTE — Telephone Encounter (Signed)
Patient was called and given a lab appointment.

## 2019-12-06 ENCOUNTER — Other Ambulatory Visit: Payer: Medicaid Other

## 2019-12-10 ENCOUNTER — Other Ambulatory Visit: Payer: Medicaid Other

## 2019-12-11 ENCOUNTER — Encounter: Payer: Medicaid Other | Admitting: Gastroenterology

## 2019-12-18 ENCOUNTER — Telehealth: Payer: Self-pay | Admitting: Pulmonary Disease

## 2019-12-18 MED ORDER — PREDNISONE 10 MG PO TABS: ORAL_TABLET | ORAL | 0 refills | Status: AC

## 2019-12-18 NOTE — Telephone Encounter (Signed)
Called and spoke with Patient.  Dr. Evlyn Courier recommendations given.  Patient stated understanding.  Nothing further at this time.

## 2019-12-18 NOTE — Telephone Encounter (Signed)
Please let her know I have sent script for prednisone taper.  She should call back if not feeling improvement in next couple of days.

## 2019-12-18 NOTE — Telephone Encounter (Signed)
Called and spoke with Patient. Patient stated she has been under stress this week, and her step father passed last night.  Patient stated she needs to be with her Mother. Patient stated last night, with stress added, her asthma started flaring.  Patient stated she is having wheezing, mostly unproductive cough, some sob, but no fever. Patient stated she has been using her inhalers, and nebs as directed. Patient stated she felt that she needed Prednisone to help her at this time.  Patient declined OV at this time, because of need to be with her Mother. Patient requested any prescritions be sent to [facility]  Message routed to Dr. Craige Cotta

## 2019-12-30 ENCOUNTER — Telehealth: Payer: Self-pay | Admitting: Pulmonary Disease

## 2019-12-30 NOTE — Telephone Encounter (Signed)
Called and spoke with pt who stated she is leaving Friday 3/12 to go out of town and she is going to be gone x1 month due to needing to care for her mom. Pt also stated they are trying to make a decision about her mom with what they are going to do about her care and condition.  Stated to pt that we should get her scheduled for a visit to reassess her asthma prior to her going out of town and pt verbalized understanding. Due to transportation issues, pt has been scheduled for a televisit tomorrow 3/9 with Beth. Nothing further needed.

## 2019-12-31 ENCOUNTER — Ambulatory Visit: Payer: Medicaid Other | Admitting: Primary Care

## 2019-12-31 ENCOUNTER — Encounter: Payer: Self-pay | Admitting: Adult Health

## 2019-12-31 ENCOUNTER — Ambulatory Visit (INDEPENDENT_AMBULATORY_CARE_PROVIDER_SITE_OTHER): Payer: Medicaid Other | Admitting: Adult Health

## 2019-12-31 ENCOUNTER — Other Ambulatory Visit: Payer: Self-pay

## 2019-12-31 DIAGNOSIS — J301 Allergic rhinitis due to pollen: Secondary | ICD-10-CM

## 2019-12-31 DIAGNOSIS — G4733 Obstructive sleep apnea (adult) (pediatric): Secondary | ICD-10-CM

## 2019-12-31 DIAGNOSIS — Z9989 Dependence on other enabling machines and devices: Secondary | ICD-10-CM | POA: Diagnosis not present

## 2019-12-31 DIAGNOSIS — J4551 Severe persistent asthma with (acute) exacerbation: Secondary | ICD-10-CM

## 2019-12-31 NOTE — Progress Notes (Signed)
Reviewed and agree with assessment/plan.   Luisa Louk, MD [place] [facility] 10/19/2016, 12:24 PM Pager:  [phone]  

## 2019-12-31 NOTE — Patient Instructions (Signed)
Finish prednisone taper as planned  Continue on Dulera 2 puffs Twice daily   Continue on Singulair daily Continue on Claritin daily Albuterol as needed for wheezing and shortness of breath Please call us when you return from your trip to get established on Fasenra . Continue on CPAP at bedtime Goal was to wear for greater than 4 hours each night Do not drive if sleepy  Follow-up with Dr Craige Cotta in 2 months and As needed

## 2019-12-31 NOTE — Progress Notes (Signed)
Virtual Visit via Telephone Note  I connected with Kathleen Rodriguez on 12/31/19 at 10:30 AM EST by telephone and verified that I am speaking with the correct person using two identifiers.  Location: Patient: Home  Provider: Office    I discussed the limitations, risks, security and privacy concerns of performing an evaluation and management service by telephone and the availability of in person appointments. I also discussed with the patient that there may be a patient responsible charge related to this service. The patient expressed understanding and agreed to proceed.   History of Present Illness: 54 year old female former smoker followed for allergic asthma, postinfectious related bronchiectasis and struct of sleep apnea Medical history significant for polysubstance-cocaine Allergic reaction to Xolair . Today's televisit is for a 69-monthfollow-up for asthma.  Last visit patient had a severe asthma exacerbation and was admitted to the hospital.  She was treated with nebulized bronchodilators and steroids.  Patient says she did improve however 2 weeks ago she started having some flare of her asthma symptoms with wheezing.  She was called in a prednisone taper.  Patient says she is improved.  She has a couple days left of her prednisone.  She says overall she is doing okay.  She had a lot of stress with her family.  Her father recently passed away and her mother is ill.  [place] for her.  Denies chest pain, orthopnea or leg swelling.  Says she uses her albuterol inhaler a few times a week.  She remains on Dulera twice daily. Previous allergic reaction to Xolair . Was recommended to start on Fasenra , but has not come in for injections.  She says she will start this when she gets back from getting her mom settled here in next couple of weeks.  Has OSA , says she wears CPAP most nights.   Patient Active Problem List   Diagnosis Date Noted  . Prediabetes 10/30/2019  . Hyperglycemia 10/10/2019  . Acute asthma exacerbation 10/09/2019  . 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 Oral thrush 07/04/2019  . Medication management 07/04/2019  . Chest pain 12/25/2018  . Acute-on-chronic respiratory failure ([place] 12/24/2018  . Skin ulcer ([place] secondary to bullous impetigo /Marilynn Rail11/03/2018  . Asthma 10/22/2017  . Osteoarthritis of left knee 09/01/2016  . 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 Tobacco abuse   . Primary osteoarthritis of left knee 03/09/2016  . Non compliance with medical treatment 08/19/2015  . Anemia, iron deficiency 08/12/2015  . Essential hypertension 07/25/2015  . COPD exacerbation ([place] 12/28/2014  . Dysfunctional uterine bleeding 12/28/2014  . Anemia 12/28/2014  . Morbid obesity ([place] 05/16/2014  . Chronic cough 05/01/2014  . Upper airway cough syndrome 05/01/2014  . GERD (gastroesophageal reflux disease) 04/08/2014  . Asthma exacerbation 04/06/2014  . OSA (obstructive sleep apnea) 03/20/2013  . Uncontrolled persistent asthma 02/05/2013   Current Outpatient Medications on File Prior to Visit  Medication Sig Dispense Refill  . acetaminophen (TYLENOL) 500 MG tablet Take 500 mg by mouth every 6 (six) hours as needed for mild pain.    .Marland Kitchenalbuterol (PROVENTIL) (2.5 MG/3ML) 0.083% nebulizer solution Take 3 mLs (2.5 mg total) by nebulization every 6 (six) hours as needed for wheezing or shortness of breath. 360 mL 2  . albuterol (VENTOLIN HFA) 108 (90 Base) MCG/ACT inhaler Inhale 2 puffs into the lungs every  6 (six) hours as needed for wheezing or shortness of breath. 6.7 g 5  . aspirin 81 MG EC tablet Take 1 tablet (81 mg total) by mouth daily. 30 tablet 3  . Blood Glucose Monitoring Suppl (ACCU-CHEK GUIDE ME) w/Device KIT 1 each by Does not apply route daily. Dx; Prediabetets; Prednisone use 1 kit 0  . Camphor-Eucalyptus-Menthol (VICKS  VAPORUB EX) Apply 1 application topically daily as needed (congestion).    . cloNIDine (CATAPRES) 0.1 MG tablet Take 1 tablet (0.1 mg total) by mouth 2 (two) times daily. Must keep office visit tomorrow for additional refills. 60 tablet 0  . fluticasone (FLONASE) 50 MCG/ACT nasal spray Place 1 spray into both nostrils daily. (Patient taking differently: Place 1 spray into both nostrils daily as needed for allergies. ) 16 g 5  . gabapentin (NEURONTIN) 300 MG capsule Take 2 capsules (600 mg total) by mouth 2 (two) times daily. 120 capsule 3  . glucose blood (ACCU-CHEK GUIDE) test strip Use as instructed daily before breakfast. Dx; Prediabetets; Prednisone use 30 each 12  . ipratropium (ATROVENT) 0.02 % nebulizer solution Take 2.5 mLs (0.5 mg total) by nebulization 2 (two) times daily as needed for wheezing or shortness of breath. 360 mL 5  . loratadine (CLARITIN) 10 MG tablet Take 1 tablet (10 mg total) by mouth daily. 30 tablet 11  . meloxicam (MOBIC) 7.5 MG tablet Take 1 tablet (7.5 mg total) by mouth daily. 30 tablet 1  . Menthol (HALLS COUGH DROPS MT) Use as directed 1 drop in the mouth or throat 2 (two) times daily as needed (sore throat and cough).    . metFORMIN (GLUCOPHAGE) 500 MG tablet Take 1 tablet (500 mg total) by mouth 2 (two) times daily with a meal. Dx; Prediabetes 60 tablet 6  . methocarbamol (ROBAXIN) 500 MG tablet Take 1 tablet (500 mg total) by mouth every 8 (eight) hours as needed for muscle spasms. 60 tablet 1  . Misc. Devices MISC Blood pressure monitor  Dx: Hypertension 1 each 0  . mometasone-formoterol (DULERA) 200-5 MCG/ACT AERO Inhale 2 puffs into the lungs 2 (two) times daily. 8.8 g 5  . montelukast (SINGULAIR) 10 MG tablet Take 1 tablet (10 mg total) by mouth at bedtime. 90 tablet 1  . pantoprazole (PROTONIX) 40 MG tablet Take 1 tablet (40 mg total) by mouth daily at 12 noon. 30 tablet 3  . Spacer/Aero-Holding Chambers (AEROCHAMBER MV) inhaler Use as instructed 1 each 2  .  traMADol (ULTRAM) 50 MG tablet Take 1 tablet (50 mg total) by mouth every 6 (six) hours as needed. (Patient taking differently: Take 50 mg by mouth every 6 (six) hours as needed for moderate pain. ) 10 tablet 0  . urea (CARMOL) 20 % cream Apply 1 application topically daily as needed (For dry feet). 85 g 1   No current facility-administered medications on file prior to visit.      Observations/Objective: CT chest4/15/14 >>no PE, no acute findings HRCT chest 03/12/18 >> mild BTX  Sleep tests:  PSG 04/19/13 >> AHI 16.4, SpO2 low 84%.  Cardiac tests:  Echo 12/25/18 >> EF 55 to 60%   Assessment and Plan: Allergic Asthma- prone to recurrent exacerbations.  frequent steroid use.  Patient education on potential side effects of steroid use.  She has been approved for [name] however has not started this.  She says she will contact us when she returns to [place] to get this established.  For now continue [facility] and trigger prevention.  Chronic rhinitis -continue on her current regimen  OSA -controlled on CPAP  CPAP download on return  Plan  Patient Instructions  Finish prednisone taper as planned  Continue on Dulera 2 puffs Twice daily   Continue on Singulair daily Continue on Claritin daily Albuterol as needed for wheezing and shortness of breath Please call us when you return from your trip to get established on Fasenra . Continue on CPAP at bedtime Goal was to wear for greater than 4 hours each night Do not drive if sleepy  Follow-up with Dr Halford Chessman in 2 months and As needed         Follow Up Instructions: Follow up in 2 months and As needed      I discussed the assessment and treatment plan with the patient. The patient was provided an opportunity to ask questions and all were answered. The patient agreed with the plan and demonstrated an understanding of the instructions.   The patient was advised to call back or seek an in-person evaluation if the symptoms worsen or  if the condition fails to improve as anticipated.  I provided 26  minutes of non-face-to-face time during this encounter.   [name], NP

## 2020-01-20 NOTE — Telephone Encounter (Signed)
Error

## 2020-02-06 ENCOUNTER — Ambulatory Visit: Payer: Medicaid Other

## 2020-02-07 DIAGNOSIS — M79672 Pain in left foot: Secondary | ICD-10-CM | POA: Diagnosis not present

## 2020-02-07 DIAGNOSIS — M2042 Other hammer toe(s) (acquired), left foot: Secondary | ICD-10-CM | POA: Diagnosis not present

## 2020-02-07 DIAGNOSIS — M2142 Flat foot [pes planus] (acquired), left foot: Secondary | ICD-10-CM | POA: Diagnosis not present

## 2020-02-07 DIAGNOSIS — M2141 Flat foot [pes planus] (acquired), right foot: Secondary | ICD-10-CM | POA: Diagnosis not present

## 2020-02-07 DIAGNOSIS — M2041 Other hammer toe(s) (acquired), right foot: Secondary | ICD-10-CM | POA: Diagnosis not present

## 2020-02-14 DIAGNOSIS — Z23 Encounter for immunization: Secondary | ICD-10-CM | POA: Diagnosis not present

## 2020-02-20 ENCOUNTER — Other Ambulatory Visit: Payer: Self-pay | Admitting: Pulmonary Disease

## 2020-02-27 ENCOUNTER — Telehealth: Payer: Self-pay | Admitting: Pulmonary Disease

## 2020-02-28 NOTE — Telephone Encounter (Signed)
Called and spoke with Patient. Patient requested to start Fasenra injections.  Patient stated she has Epipen. Patient is aware of office policy of 2 hours wait and to bring Epipen. Patient is scheduled 03/06/20, at 1430.   Harrington Challenger Order: 30mg  #1 prefilled syringe Ordered date: 02/28/20 Expected date of arrival: 03/05/20 Ordered by: Maisee Vollman,LPN [facility]ty [facility]

## 2020-03-05 NOTE — Telephone Encounter (Signed)
Fasenra Shipment Received:  30mg  #1 prefilled syringe Medication arrival date: 03/05/20 Lot #: [id number] Exp date: 05/22/2021 Received by: 05/24/2021

## 2020-03-06 ENCOUNTER — Ambulatory Visit: Payer: Medicaid Other

## 2020-03-12 ENCOUNTER — Ambulatory Visit: Payer: Medicaid Other

## 2020-03-14 ENCOUNTER — Other Ambulatory Visit: Payer: Self-pay | Admitting: Family Medicine

## 2020-03-14 DIAGNOSIS — G629 Polyneuropathy, unspecified: Secondary | ICD-10-CM

## 2020-03-24 ENCOUNTER — Other Ambulatory Visit: Payer: Self-pay | Admitting: Pulmonary Disease

## 2020-03-25 ENCOUNTER — Telehealth: Payer: Self-pay | Admitting: Pulmonary Disease

## 2020-03-25 NOTE — Telephone Encounter (Signed)
Called and spoke with pt who was requesting mucinex samples. Stated to pt that I would check to see if we had any and then call her back to let her know and pt verbalized understanding.  I have placed samples of mucinex up front for pt. Attempted to call pt back to let her know that this was done but unable to reach pt. Left her a detailed message that the mucinex has been placed up front for her to come by and pick up. Nothing further needed.

## 2020-03-26 DIAGNOSIS — Z046 Encounter for general psychiatric examination, requested by authority: Secondary | ICD-10-CM | POA: Diagnosis not present

## 2020-03-26 DIAGNOSIS — R0602 Shortness of breath: Secondary | ICD-10-CM | POA: Diagnosis not present

## 2020-03-27 ENCOUNTER — Ambulatory Visit: Payer: Medicaid Other | Admitting: Pulmonary Disease

## 2020-03-30 ENCOUNTER — Telehealth: Payer: Self-pay | Admitting: Family Medicine

## 2020-03-30 DIAGNOSIS — G8929 Other chronic pain: Secondary | ICD-10-CM

## 2020-03-30 NOTE — Telephone Encounter (Signed)
Patient is requesting another  Referral for Podiatry  Thank you

## 2020-03-30 NOTE — Telephone Encounter (Signed)
Can you please obtain the reason for referral so this can be inserted in the notes? Thanks.

## 2020-03-31 NOTE — Telephone Encounter (Signed)
Your welcome ! Referral sent to [facility]  waiting for an appointment .

## 2020-03-31 NOTE — Telephone Encounter (Signed)
Thank you Arna Medici. Referral has been placed

## 2020-03-31 NOTE — Telephone Encounter (Signed)
Gm Dr Alvis Lemmings  The reason is  chronic leg and feet desquamation with recurrent infection.

## 2020-04-03 ENCOUNTER — Ambulatory Visit: Payer: Medicaid Other

## 2020-04-06 ENCOUNTER — Ambulatory Visit: Payer: Medicaid Other

## 2020-04-07 ENCOUNTER — Telehealth: Payer: Self-pay | Admitting: Pulmonary Disease

## 2020-04-07 MED ORDER — PREDNISONE 10 MG PO TABS: ORAL_TABLET | ORAL | 0 refills | Status: DC

## 2020-04-07 NOTE — Telephone Encounter (Signed)
ATC pt, there was no answer and no option to leave a message. Will try back. 

## 2020-04-07 NOTE — Telephone Encounter (Signed)
Yes I will send in prednisone taper, she has severe persistent asthma. Make sure she continues Dulera 200 two puffs twice daily, Singulair and Claritin as prescribed. I want her to use albuterol inhaler or nebulizer every 4-6 hours for shortness of breath/wheezing. If breathing worsens despite above recommends ED evaluation. She will need a follow-up with Dr. Craige Cotta or APP in 1-2 weeks.

## 2020-04-07 NOTE — Telephone Encounter (Signed)
Spoke with pt. She is aware of Beth's recommendations. Nothing further was needed.

## 2020-04-07 NOTE — Telephone Encounter (Signed)
Spoke with patient, she is having sob and wheezing.  She is unable to come into the office for a visit.  She is requesting prednisone.  Contacted Buelah Manis NP regarding patient's s/s as well as request for prednisone.  She will review patient's chart and advise on whether she will send in Prednisone.  I contacted patient, let her know the plan and advised her I would call her back when I heard back from [facility] about the prednisone.  She verbalized understanding.

## 2020-04-09 ENCOUNTER — Ambulatory Visit (INDEPENDENT_AMBULATORY_CARE_PROVIDER_SITE_OTHER): Payer: Medicaid Other | Admitting: Podiatrist

## 2020-04-09 ENCOUNTER — Other Ambulatory Visit: Payer: Self-pay | Admitting: Pulmonary Disease

## 2020-04-09 ENCOUNTER — Other Ambulatory Visit: Payer: Self-pay

## 2020-04-09 ENCOUNTER — Ambulatory Visit (INDEPENDENT_AMBULATORY_CARE_PROVIDER_SITE_OTHER): Payer: Medicaid Other

## 2020-04-09 ENCOUNTER — Ambulatory Visit: Payer: Medicaid Other | Admitting: Adult Health

## 2020-04-09 ENCOUNTER — Encounter: Payer: Self-pay | Admitting: Podiatrist

## 2020-04-09 VITALS — Temp 96.2°F

## 2020-04-09 DIAGNOSIS — E0843 Diabetes mellitus due to underlying condition with diabetic autonomic (poly)neuropathy: Secondary | ICD-10-CM

## 2020-04-09 DIAGNOSIS — L600 Ingrowing nail: Secondary | ICD-10-CM | POA: Diagnosis not present

## 2020-04-09 DIAGNOSIS — M79672 Pain in left foot: Secondary | ICD-10-CM

## 2020-04-09 DIAGNOSIS — M2042 Other hammer toe(s) (acquired), left foot: Secondary | ICD-10-CM

## 2020-04-09 NOTE — Patient Instructions (Signed)
Gabapentin does come in 600 mg tablets so I am recommending 600 mg- 3 times a day.   Gabapentin capsules or tablets What is this medicine? GABAPENTIN (GA ba pen tin) is used to control seizures in certain types of epilepsy. It is also used to treat certain types of nerve pain. This medicine may be used for other purposes; ask your health care provider or pharmacist if you have questions. COMMON BRAND NAME(S): Active-PAC with Gabapentin, Gabarone, Neurontin What should I tell my health care provider before I take this medicine? They need to know if you have any of these conditions:  history of drug abuse or alcohol abuse problem  kidney disease  lung or breathing disease  suicidal thoughts, plans, or attempt; a previous suicide attempt by you or a family member  an unusual or allergic reaction to gabapentin, other medicines, foods, dyes, or preservatives  pregnant or trying to get pregnant  breast-feeding How should I use this medicine? Take this medicine by mouth with a glass of water. Follow the directions on the prescription label. You can take it with or without food. If it upsets your stomach, take it with food. Take your medicine at regular intervals. Do not take it more often than directed. Do not stop taking except on your doctor's advice. If you are directed to break the 600 or 800 mg tablets in half as part of your dose, the extra half tablet should be used for the next dose. If you have not used the extra half tablet within 28 days, it should be thrown away. A special MedGuide will be given to you by the pharmacist with each prescription and refill. Be sure to read this information carefully each time. Talk to your pediatrician regarding the use of this medicine in children. While this drug may be prescribed for children as young as 3 years for selected conditions, precautions do apply. Overdosage: If you think you have taken too much of this medicine contact a poison control  center or emergency room at once. NOTE: This medicine is only for you. Do not share this medicine with others. What if I miss a dose? If you miss a dose, take it as soon as you can. If it is almost time for your next dose, take only that dose. Do not take double or extra doses. What may interact with this medicine? This medicine may interact with the following medications:  alcohol  antihistamines for allergy, cough, and cold  certain medicines for anxiety or sleep  certain medicines for depression like amitriptyline, fluoxetine, sertraline  certain medicines for seizures like phenobarbital, primidone  certain medicines for stomach problems  general anesthetics like halothane, isoflurane, methoxyflurane, propofol  local anesthetics like lidocaine, pramoxine, tetracaine  medicines that relax muscles for surgery  narcotic medicines for pain  phenothiazines like chlorpromazine, mesoridazine, prochlorperazine, thioridazine This list may not describe all possible interactions. Give your health care provider a list of all the medicines, herbs, non-prescription drugs, or dietary supplements you use. Also tell them if you smoke, drink alcohol, or use illegal drugs. Some items may interact with your medicine. What should I watch for while using this medicine? Visit your doctor or health care provider for regular checks on your progress. You may want to keep a record at home of how you feel your condition is responding to treatment. You may want to share this information with your doctor or health care provider at each visit. You should contact your doctor or health care  provider if your seizures get worse or if you have any new types of seizures. Do not stop taking this medicine or any of your seizure medicines unless instructed by your doctor or health care provider. Stopping your medicine suddenly can increase your seizures or their severity. This medicine may cause serious skin reactions.  They can happen weeks to months after starting the medicine. Contact your health care provider right away if you notice fevers or flu-like symptoms with a rash. The rash may be red or purple and then turn into blisters or peeling of the skin. Or, you might notice a red rash with swelling of the face, lips or lymph nodes in your neck or under your arms. Wear a medical identification bracelet or chain if you are taking this medicine for seizures, and carry a card that lists all your medications. You may get drowsy, dizzy, or have blurred vision. Do not drive, use machinery, or do anything that needs mental alertness until you know how this medicine affects you. To reduce dizzy or fainting spells, do not sit or stand up quickly, especially if you are an older patient. Alcohol can increase drowsiness and dizziness. Avoid alcoholic drinks. Your mouth may get dry. Chewing sugarless gum or sucking hard candy, and drinking plenty of water will help. The use of this medicine may increase the chance of suicidal thoughts or actions. Pay special attention to how you are responding while on this medicine. Any worsening of mood, or thoughts of suicide or dying should be reported to your health care provider right away. Women who become pregnant while using this medicine may enroll in the [place] Pregnancy Registry by calling [phone]. This registry collects information about the safety of antiepileptic drug use during pregnancy. What side effects may I notice from receiving this medicine? Side effects that you should report to your doctor or health care professional as soon as possible:  allergic reactions like skin rash, itching or hives, swelling of the face, lips, or tongue  breathing problems  rash, fever, and swollen lymph nodes  redness, blistering, peeling or loosening of the skin, including inside the mouth  suicidal thoughts, mood changes Side effects that usually do not  require medical attention (report to your doctor or health care professional if they continue or are bothersome):  dizziness  drowsiness  headache  nausea, vomiting  swelling of ankles, feet, hands  tiredness This list may not describe all possible side effects. Call your doctor for medical advice about side effects. You may report side effects to FDA at 1-800-FDA-1088. Where should I keep my medicine? Keep out of reach of children. This medicine may cause accidental overdose and death if it taken by other adults, children, or pets. Mix any unused medicine with a substance like cat litter or coffee grounds. Then throw the medicine away in a sealed container like a sealed bag or a coffee can with a lid. Do not use the medicine after the expiration date. Store at room temperature between 15 and 30 degrees C (59 and 86 degrees F). NOTE: This sheet is a summary. It may not cover all possible information. If you have questions about this medicine, talk to your doctor, pharmacist, or health care provider.  2020 Elsevier/Gold Standard (2019-01-11 14:16:43)

## 2020-04-09 NOTE — Progress Notes (Signed)
Chief Complaint  Patient presents with  . Peripheral Neuropathy    L hallux (beneath nail), L plantar midfoot. x6-7 months. Pt stated, "I'm here for a second opinion. I saw another podiatrist and was diagnosed with diabetic neuropathy. I was told to take gabapentin and buy diabetic shoes. I can't afford the shoes. The pain is so bad that it confines me to bed. It's a needle-like pain. I also have numbness in my toes".   . Diabetes    Most recent A1c on file = 6.3. Diabetic neuropathy. No known history of ulcers.     HPI: Patient is 54 y.o. female who presents today for the concerns as listed above. The patient states she has pain on her left great toenail in the corner and on the bottom of her foot just underneath where the toes meet the foot. She describes the pain as numbness and tingling at the tips of the toes.  She was seen by another podiatrist who diagnosed her with neuropathy and recommended Gabapentin and Diabetic shoes.  She is here for another opinion.     Review of Systems No fevers, chills, nausea, muscle aches, no difficulty breathing, no calf pain, no chest pain or shortness of breath.   Physical Exam  GENERAL APPEARANCE: Alert, conversant. Appropriately groomed. No acute distress.   VASCULAR: Pedal pulses palpable DP and PT bilateral.  Capillary refill time is immediate to all digits,  Proximal to distal cooling it warm to warm.  Digital hair growth is present bilateral   NEUROLOGIC: sensation is absent to 5.07 monofilament at 0/5 sites bilateral.  Light touch is decreased bilateral, vibratory sensation decreased bilateral.  Subjective numbness and tingling noted.  Negative tinels sign elicited.     MUSCULOSKELETAL: acceptable muscle strength, tone and stability bilateral.  No gross boney pedal deformities noted.  No pain, crepitus or limitation noted with foot and ankle range of motion bilateral.   DERMATOLOGIC: skin is warm, supple, and dry.  No open lesions noted.  No  rash, no pre ulcerative lesions. Left hallux nail is brittle, thick with debris distally.  Pain on the medial corner where the nail meets the medial nail fold is also seen.   Xrays: 3 views left foot obtained.  Generalized diffuse osteopenia noted,  Hammertoe contracture 2,3,4,5 present,  Decreased arch height noted, possible prior fracture 2nd digit proximal phalanx, os peroneum noted, no acute osseous abnormalities present.  No dislocation seen  Assessment     ICD-10-CM   1. Left foot pain  M79.672 DG Foot Complete Left  2. Diabetes mellitus due to underlying condition with diabetic autonomic neuropathy, unspecified whether long term insulin use (HCC)  E08.43   3. Ingrown toenail of both feet  L60.0       Plan   1.  Most of her pain does appear neuropathic in nature.  She is currently taking 1200 mg of Gabapentin daily and I discussed with her that this is a medicine that is commonly adjusted and she may benefit from a higher dose.  I would recommend she speak with her primary care physician to see about a higher dose (possibly 600mg , tid)  2.  Left hallux nail slant back debridement performed without anesthesia and without complication to take the pressure off the medial nail fold.  She tolerated this well.    3.  Recommended periodic routine debridements of her nails due to her being diabetic with neuropathy  4.  Did discuss the positive benefits of diabetic shoes and  I do think these would be good for her to have.  We will set up an appointment for her to get diabetic shoes.

## 2020-04-13 ENCOUNTER — Telehealth: Payer: Self-pay | Admitting: *Deleted

## 2020-04-13 NOTE — Telephone Encounter (Signed)
Left message requesting more information from pt concerning the information that was needed from Dr. Irving Shows.

## 2020-04-13 NOTE — Telephone Encounter (Signed)
Pt states she sees Dr. Irving Shows and she was to email her PCP for gabapentin.

## 2020-04-14 ENCOUNTER — Telehealth: Payer: Self-pay | Admitting: Podiatrist

## 2020-04-14 ENCOUNTER — Other Ambulatory Visit: Payer: Self-pay | Admitting: Podiatrist

## 2020-04-14 MED ORDER — GABAPENTIN 600 MG PO TABS: 600.0000 mg | ORAL_TABLET | Freq: Three times a day (TID) | ORAL | 0 refills | Status: DC

## 2020-04-14 NOTE — Telephone Encounter (Signed)
I will go ahead and change the dosage to 600mg  tid.  Please ask her to try this out and if it is working better for her, she can ask her primary care doctor to refill at this strength when she is due for a refill.  Thanks!    (and I think I finally figured out how to reply to you in these patient calls-  baby steps!)

## 2020-04-14 NOTE — Telephone Encounter (Signed)
Pt has called and stated that she had spoke to Dr.Egerton in her appt   About having her gabapentin up on dosage from 300 mg to 600 and that Dr.Egerton was gonna email her PC and she had not heard anything from pc or Dr.Egerton call came in  @ 12.03pm

## 2020-04-16 NOTE — Telephone Encounter (Signed)
I informed pt of Dr. Faylene Million orders. Pt states understanding.

## 2020-04-17 ENCOUNTER — Other Ambulatory Visit: Payer: Medicaid Other | Admitting: Orthotics

## 2020-04-20 ENCOUNTER — Telehealth: Payer: Self-pay | Admitting: Podiatrist

## 2020-04-20 NOTE — Telephone Encounter (Signed)
Left message for pt to call to discuss diabetic shoe appt.

## 2020-04-21 ENCOUNTER — Telehealth: Payer: Self-pay | Admitting: Pulmonary Disease

## 2020-04-21 NOTE — Telephone Encounter (Signed)
Spoke with pt. She has been scheduled for 05/01/2020 at 1430. This is the 9th time the pt has rescheduled her appointment to start [facility].

## 2020-04-28 ENCOUNTER — Ambulatory Visit: Payer: Medicaid Other | Admitting: Family Medicine

## 2020-05-01 ENCOUNTER — Ambulatory Visit: Payer: Medicaid Other

## 2020-05-01 ENCOUNTER — Telehealth: Payer: Self-pay | Admitting: Pulmonary Disease

## 2020-05-01 NOTE — Telephone Encounter (Signed)
Pt has been scheduled on 13 different occassions for her 1st Fasenra injection. She either cancels the appointment at the last minute or no shows for the appointment all together. This has been going on since September 2020.  Dr. Craige Cotta - how would you like [place] to proceed? Don't schedule her again?

## 2020-05-04 ENCOUNTER — Other Ambulatory Visit: Payer: Medicaid Other | Admitting: Orthotics

## 2020-05-05 NOTE — Telephone Encounter (Signed)
Do not schedule fasenra injections with her anymore for now.  She will need to have ROV with me first before rescheduling fasenra injection.

## 2020-05-05 NOTE — Telephone Encounter (Signed)
Noted  

## 2020-05-14 ENCOUNTER — Other Ambulatory Visit: Payer: Self-pay | Admitting: Podiatrist

## 2020-05-14 DIAGNOSIS — M2042 Other hammer toe(s) (acquired), left foot: Secondary | ICD-10-CM

## 2020-05-26 ENCOUNTER — Encounter (HOSPITAL_COMMUNITY): Payer: Self-pay | Admitting: Emergency Medicine

## 2020-05-26 ENCOUNTER — Ambulatory Visit (INDEPENDENT_AMBULATORY_CARE_PROVIDER_SITE_OTHER): Payer: Self-pay

## 2020-05-26 ENCOUNTER — Ambulatory Visit (HOSPITAL_COMMUNITY)
Admission: EM | Admit: 2020-05-26 | Discharge: 2020-05-26 | Disposition: A | Discharge status: Home / Self Care | Payer: Self-pay | Attending: Family Medicine | Admitting: Family Medicine

## 2020-05-26 ENCOUNTER — Other Ambulatory Visit: Payer: Self-pay

## 2020-05-26 DIAGNOSIS — M79672 Pain in left foot: Secondary | ICD-10-CM | POA: Diagnosis not present

## 2020-05-26 DIAGNOSIS — M7989 Other specified soft tissue disorders: Secondary | ICD-10-CM | POA: Diagnosis not present

## 2020-05-26 DIAGNOSIS — R600 Localized edema: Secondary | ICD-10-CM | POA: Diagnosis not present

## 2020-05-26 DIAGNOSIS — S92515A Nondisplaced fracture of proximal phalanx of left lesser toe(s), initial encounter for closed fracture: Secondary | ICD-10-CM

## 2020-05-26 NOTE — Discharge Instructions (Signed)
Please use the post op shoe  Please try ice  Please try tylenol  Please follow up with sports medicine

## 2020-05-26 NOTE — ED Provider Notes (Signed)
[facility]    CSN: [id number] Arrival date & time: 05/26/20  1809      History   Chief Complaint Chief Complaint  Patient presents with  . Foot Pain    HPI Kathleen Rodriguez is a 54 y.o. female.   She is presenting with left foot pain after she hit her toes.  She is having swelling over the third MTP joint.  Denies any numbness or tingling.  The pain is worse with ambulation.  It is localized to this region.  HPI  Past Medical History:  Diagnosis Date  . Arthritis    "knees, lower back; legs, ankles" (01/27/2016)  . Asthma    followed by Dr. Halford Chessman  . CHF (congestive heart failure) ([place]) 2016   "when I went into a coma"  . Cocaine abuse ([place])   . 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 Dyspnea   . GERD (gastroesophageal reflux disease)   . Hypertension    "doctor took me off RX in 2016" (01/27/2016)  . Influenza B April 8299   Complicated by multi-organ failure  . OSA on CPAP "since " 03/20/2013  . Pneumonia 2016  . Required emergent intubation    asthma exacerbation in 2016  . Tobacco abuse   . Upper airway cough syndrome     Patient Active Problem List   Diagnosis Date Noted  . Prediabetes 10/30/2019  . Hyperglycemia 10/10/2019  . Acute asthma exacerbation 10/09/2019  . 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 Oral thrush 07/04/2019  . Medication management 07/04/2019  . Chest pain 12/25/2018  . Acute-on-chronic respiratory failure ([place]) 12/24/2018  . Skin ulcer ([place]) secondary to bullous impetigo Marilynn Rail 08/29/2018  . Asthma 10/22/2017  . Osteoarthritis of left knee 09/01/2016  . 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 Tobacco abuse   . Primary osteoarthritis of left knee 03/09/2016  . Non compliance with medical treatment 08/19/2015  . Anemia, iron deficiency 08/12/2015  . Essential hypertension 07/25/2015  . COPD exacerbation ([place]) 12/28/2014  . Dysfunctional uterine bleeding 12/28/2014  . Anemia  12/28/2014  . Morbid obesity ([place]) 05/16/2014  . Chronic cough 05/01/2014  . Upper airway cough syndrome 05/01/2014  . GERD (gastroesophageal reflux disease) 04/08/2014  . Asthma exacerbation 04/06/2014  . OSA (obstructive sleep apnea) 03/20/2013  . Uncontrolled persistent asthma 02/05/2013    Past Surgical History:  Procedure Laterality Date  . [place]   as a teenager , cyst was benign  . CESAREAN SECTION  2006  . LACERATION REPAIR Right ~ 1997   "tried to cut myself"  . TOTAL KNEE ARTHROPLASTY Left 09/01/2016   Procedure: LEFT TOTAL KNEE ARTHROPLASTY;  Surgeon: Leandrew Koyanagi, MD;  Location: [facility];  Service: Orthopedics;  Laterality: Left;  . TUBAL LIGATION  2006    OB History   No obstetric history on file.      Home Medications    Prior to Admission medications   Medication Sig Start Date End Date Taking? Authorizing Provider  albuterol (PROVENTIL) (2.5 MG/3ML) 0.083% nebulizer solution Take 3 mLs (2.5 mg total) by nebulization every 6 (six) hours as needed for wheezing or shortness of breath. 10/11/19  Yes Chesley Mires, MD  albuterol (VENTOLIN HFA) 108 (90 Base) MCG/ACT inhaler Inhale 2 puffs into the lungs every 6 (six) hours as needed for wheezing or shortness of breath. 10/11/19  Yes Chesley Mires, MD  aspirin 81 MG EC tablet Take 1 tablet (  81 mg total) by mouth daily. 02/07/19  Yes [name], MD  cloNIDine (CATAPRES) 0.1 MG tablet Take 1 tablet (0.1 mg total) by mouth 2 (two) times daily. Must keep office visit tomorrow for additional refills. 10/29/19  Yes [name], MD  gabapentin (NEURONTIN) 300 MG capsule Take 2 capsules (600 mg total) by mouth 2 (two) times daily. 11/28/19  Yes Newlin, Charlane Ferretti, MD  ipratropium (ATROVENT) 0.02 % nebulizer solution Take 2.5 mLs (0.5 mg total) by nebulization 2 (two) times daily as needed for wheezing or shortness of breath. 10/11/19  Yes Chesley Mires, MD  loratadine (CLARITIN) 10 MG tablet Take 1 tablet (10 mg  total) by mouth daily. 09/09/19  Yes Sood, Elisabeth Cara, MD  methocarbamol (ROBAXIN) 500 MG tablet TAKE 1 TABLET (500 MG TOTAL) BY MOUTH EVERY 8 (EIGHT) HOURS AS NEEDED FOR MUSCLE SPASMS. 03/16/20  Yes Newlin, Charlane Ferretti, MD  mometasone-formoterol (DULERA) 200-5 MCG/ACT AERO Inhale 2 puffs into the lungs 2 (two) times daily. 10/11/19  Yes Chesley Mires, MD  montelukast (SINGULAIR) 10 MG tablet Take 1 tablet (10 mg total) by mouth at bedtime. 04/30/19  Yes Martyn Ehrich, NP  pantoprazole (PROTONIX) 40 MG tablet Take 1 tablet (40 mg total) by mouth daily at 12 noon. 02/07/19  Yes [name], MD  acetaminophen (TYLENOL) 500 MG tablet Take 500 mg by mouth every 6 (six) hours as needed for mild pain.    [provider]  Blood Glucose Monitoring Suppl (ACCU-CHEK GUIDE ME) w/Device KIT 1 each by Does not apply route daily. Dx; Prediabetets; Prednisone use 10/30/19   [name], MD  Camphor-Eucalyptus-Menthol (VICKS VAPORUB EX) Apply 1 application topically daily as needed (congestion).    [provider]  fluticasone (FLONASE) 50 MCG/ACT nasal spray Place 1 spray into both nostrils daily. Patient taking differently: Place 1 spray into both nostrils daily as needed for allergies.  02/19/18   Chesley Mires, MD  gabapentin (NEURONTIN) 600 MG tablet Take 1 tablet (600 mg total) by mouth 3 (three) times daily. 04/14/20 05/14/20  Bronson Ing, DPM  glucose blood (ACCU-CHEK GUIDE) test strip Use as instructed daily before breakfast. Dx; Prediabetets; Prednisone use 10/30/19   [name], MD  meloxicam (MOBIC) 7.5 MG tablet Take 1 tablet (7.5 mg total) by mouth daily. 04/23/19   [name], MD  Menthol (HALLS COUGH DROPS MT) Use as directed 1 drop in the mouth or throat 2 (two) times daily as needed (sore throat and cough).    [provider]  metFORMIN (GLUCOPHAGE) 500 MG tablet Take 1 tablet (500 mg total) by mouth 2 (two) times daily with a meal. Dx; Prediabetes 10/30/19   [name], MD  Misc. Devices MISC Blood pressure monitor  Dx: Hypertension 02/07/19   [name], MD  predniSONE (DELTASONE) 10 MG tablet Take 4 tabs po daily x 3 days; then 3 tabs daily x3 days; then 2 tabs daily x3 days; then 1 tab daily x 3 days; then stop 04/07/20   Martyn Ehrich, NP  Spacer/Aero-Holding Chambers (AEROCHAMBER MV) inhaler Use as instructed 07/11/18   Chesley Mires, MD  traMADol (ULTRAM) 50 MG tablet Take 1 tablet (50 mg total) by mouth every 6 (six) hours as needed. Patient not taking: Reported on 04/09/2020 09/01/19   Henderly, Britni A, PA-C  urea (CARMOL) 20 % cream Apply 1 application topically daily as needed (For dry feet). 10/15/19   [name], MD    Family History Family History  Problem Relation Age of  Onset  . Hypertension Mother   . HIV/AIDS Father     Social History Social History   Tobacco Use  . Smoking status: Former Smoker    Packs/day: 0.25    Years: 21.00    Pack years: 5.25    Types: Cigarettes    Start date: 52    Quit date: 01/31/2013    Years since quitting: 7.3  . Smokeless tobacco: Never Used  Vaping Use  . Vaping Use: Never used  Substance Use Topics  . 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 Drug use: No    Types: "Crack" cocaine    Comment: 01/27/2016 "clean from smoking crack since 2007"     Allergies   Tomato, Latex, and Wool alcohol [lanolin]   Review of Systems Review of Systems  See HPI  Physical Exam Triage Vital Signs ED Triage Vitals  Enc Vitals Group     BP 05/26/20 1929 (!) 161/103     Pulse Rate 05/26/20 1929 87     Resp 05/26/20 1929 16     Temp 05/26/20 1929 99 F (37.2 C)     Temp Source 05/26/20 1929 Oral     SpO2 05/26/20 1929 99 %     Weight --      Height --      Head Circumference --      Peak Flow --      Pain Score 05/26/20 1949 7     Pain Loc --      Pain Edu? --      Excl. in [place]? --    No data found.  Updated Vital Signs BP (!) 161/103  (BP Location: Left Arm)   Pulse 87   Temp 99 F (37.2 C) (Oral)   Resp 16   SpO2 99%   Visual Acuity Right Eye Distance:   Left Eye Distance:   Bilateral Distance:    Right Eye Near:   Left Eye Near:    Bilateral Near:     Physical Exam Gen: NAD, alert, cooperative with exam, well-appearing ENT: normal lips, normal nasal mucosa,  Eye: normal EOM, normal conjunctiva and lids Neuro: normal tone, normal sensation to touch Psych:  normal insight, alert and oriented MSK:  Left foot: Swelling and tenderness to palpation of the third digit. Swelling and ecchymosis over the third MTP joint. Neurovascularly intact   UC Treatments / Results  Labs (all labs ordered are listed, but only abnormal results are displayed) Labs Reviewed - No data to display  EKG   Radiology DG Foot Complete Left  Result Date: 05/26/2020 CLINICAL DATA:  54 year old female with left foot pain and swelling EXAM: LEFT FOOT - COMPLETE 3+ VIEW COMPARISON:  None. FINDINGS: Apparent cortical step-off of the distal aspect of the proximal phalanx of the third digit may represent a nondisplaced fracture. Clinical correlation is recommended. No other acute fracture. There is no dislocation. The bones are osteopenic. There is diffuse subcutaneous edema. No radiopaque foreign object or soft tissue gas. IMPRESSION: Possible nondisplaced fracture of the proximal phalanx of the third digit. Clinical correlation is recommended. Electronically Signed   By: Anner Crete M.D.   On: 05/26/2020 20:24    Procedures Procedures (including critical care time)  Medications Ordered in UC Medications - No data to display  Initial Impression / Assessment and Plan / UC Course  I have reviewed the triage vital signs and the nursing notes.  Pertinent labs & imaging  results that were available during my care of the patient were reviewed by me and considered in my medical decision making (see chart for details).     Ms. Bassette  is a 54 year old female that is presenting with left toe pain.  It appears that she may have a proximal phalanx fracture.  Counseled on buddy taping and provided a postop shoe.  Given information on follow-up. Final Clinical Impressions(s) / UC Diagnoses   Final diagnoses:  Closed nondisplaced fracture of proximal phalanx of lesser toe of left foot, initial encounter     Discharge Instructions     Please use the post op shoe  Please try ice  Please try tylenol  Please follow up with sports medicine     ED Prescriptions    None     PDMP not reviewed this encounter.   Rosemarie Ax, MD 05/26/20 2117

## 2020-05-26 NOTE — ED Triage Notes (Signed)
Patient reports she either kicked a crate or shopping buggy ran over left foot.  Swelling to base of toes, middle toe particularly.  Cannot bend toes.  Left pedal pulse 2 +

## 2020-05-26 NOTE — ED Notes (Signed)
Ice pack applied to left foot, elevated left foot

## 2020-06-01 ENCOUNTER — Other Ambulatory Visit: Payer: Medicaid Other | Admitting: Orthotics

## 2020-06-03 ENCOUNTER — Ambulatory Visit: Payer: Medicaid Other | Admitting: Family Medicine

## 2020-06-08 ENCOUNTER — Ambulatory Visit (INDEPENDENT_AMBULATORY_CARE_PROVIDER_SITE_OTHER): Payer: Self-pay | Admitting: Family Medicine

## 2020-06-08 ENCOUNTER — Encounter: Payer: Self-pay | Admitting: Family Medicine

## 2020-06-08 ENCOUNTER — Other Ambulatory Visit: Payer: Self-pay

## 2020-06-08 VITALS — BP 172/96 | Ht 66.0 in | Wt 269.0 lb

## 2020-06-08 DIAGNOSIS — S92515A Nondisplaced fracture of proximal phalanx of left lesser toe(s), initial encounter for closed fracture: Secondary | ICD-10-CM

## 2020-06-08 MED ORDER — HYDROCODONE-ACETAMINOPHEN 5-325 MG PO TABS: 1.0000 | ORAL_TABLET | Freq: Four times a day (QID) | ORAL | 0 refills | Status: DC | PRN

## 2020-06-08 NOTE — Patient Instructions (Signed)
This should take about 4 more weeks to heal. Buddy tape the 2nd-4th toes together. Continue hard soled shoe when up and walking around. Icing 15 minutes at a time as needed. Norco as needed for severe pain (no driving on this medication). Elevate above your heart as needed for swelling. Continue using the cane for support. Follow up with me in 2 weeks.

## 2020-06-08 NOTE — Progress Notes (Signed)
PCP: [name], MD  Subjective:   HPI: Patient is a 54 y.o. female here for left toe injury.  Patient reports on August 2 she was walking in the grocery store when she accidentally kicked a wooden crate. Immediate pain primarily in the left third digit but she felt she would try to walk this off. She noticed the next day she had a lot of swelling in her distal dorsal left foot and third digit so she went to urgent care where she had radiographs showing nondisplaced fracture of the proximal phalanx of the third digit. She has been buddy taping her toes and using a postop shoe. She states she is maybe about 10% improved with pain level and 8 out of 10. She does take gabapentin for peripheral neuropathy related to diabetes.  Past Medical History:  Diagnosis Date  . Arthritis    "knees, lower back; legs, ankles" (01/27/2016)  . Asthma    followed by Dr. Halford Chessman  . CHF (congestive heart failure) ([place]) 2016   "when I went into a coma"  . Cocaine abuse ([place])   . 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 Dyspnea   . GERD (gastroesophageal reflux disease)   . Hypertension    "doctor took me off RX in 2016" (01/27/2016)  . Influenza B April 2951   Complicated by multi-organ failure  . OSA on CPAP "since " 03/20/2013  . Pneumonia 2016  . Required emergent intubation    asthma exacerbation in 2016  . Tobacco abuse   . Upper airway cough syndrome     Current Outpatient Medications on File Prior to Visit  Medication Sig Dispense Refill  . acetaminophen (TYLENOL) 500 MG tablet Take 500 mg by mouth every 6 (six) hours as needed for mild pain.    Marland Kitchen albuterol (PROVENTIL) (2.5 MG/3ML) 0.083% nebulizer solution Take 3 mLs (2.5 mg total) by nebulization every 6 (six) hours as needed for wheezing or shortness of breath. 360 mL 2  . albuterol (VENTOLIN HFA) 108 (90 Base) MCG/ACT inhaler Inhale 2 puffs into the lungs every 6 (six) hours as needed for wheezing or shortness of breath. 6.7 g 5  . aspirin  81 MG EC tablet Take 1 tablet (81 mg total) by mouth daily. 30 tablet 3  . Blood Glucose Monitoring Suppl (ACCU-CHEK GUIDE ME) w/Device KIT 1 each by Does not apply route daily. Dx; Prediabetets; Prednisone use 1 kit 0  . Camphor-Eucalyptus-Menthol (VICKS VAPORUB EX) Apply 1 application topically daily as needed (congestion).    . cloNIDine (CATAPRES) 0.1 MG tablet Take 1 tablet (0.1 mg total) by mouth 2 (two) times daily. Must keep office visit tomorrow for additional refills. 60 tablet 0  . fluticasone (FLONASE) 50 MCG/ACT nasal spray Place 1 spray into both nostrils daily. (Patient taking differently: Place 1 spray into both nostrils daily as needed for allergies. ) 16 g 5  . gabapentin (NEURONTIN) 300 MG capsule Take 2 capsules (600 mg total) by mouth 2 (two) times daily. 120 capsule 3  . gabapentin (NEURONTIN) 600 MG tablet Take 1 tablet (600 mg total) by mouth 3 (three) times daily. 90 tablet 0  . glucose blood (ACCU-CHEK GUIDE) test strip Use as instructed daily before breakfast. Dx; Prediabetets; Prednisone use 30 each 12  . ipratropium (ATROVENT) 0.02 % nebulizer solution Take 2.5 mLs (0.5 mg total) by nebulization 2 (two) times daily as needed for wheezing or shortness of breath. 360 mL 5  . loratadine (CLARITIN) 10 MG tablet Take 1  tablet (10 mg total) by mouth daily. 30 tablet 11  . meloxicam (MOBIC) 7.5 MG tablet Take 1 tablet (7.5 mg total) by mouth daily. 30 tablet 1  . Menthol (HALLS COUGH DROPS MT) Use as directed 1 drop in the mouth or throat 2 (two) times daily as needed (sore throat and cough).    . metFORMIN (GLUCOPHAGE) 500 MG tablet Take 1 tablet (500 mg total) by mouth 2 (two) times daily with a meal. Dx; Prediabetes 60 tablet 6  . methocarbamol (ROBAXIN) 500 MG tablet TAKE 1 TABLET (500 MG TOTAL) BY MOUTH EVERY 8 (EIGHT) HOURS AS NEEDED FOR MUSCLE SPASMS. 60 tablet 0  . Misc. Devices MISC Blood pressure monitor  Dx: Hypertension 1 each 0  . mometasone-formoterol (DULERA)  200-5 MCG/ACT AERO Inhale 2 puffs into the lungs 2 (two) times daily. 8.8 g 5  . montelukast (SINGULAIR) 10 MG tablet Take 1 tablet (10 mg total) by mouth at bedtime. 90 tablet 1  . pantoprazole (PROTONIX) 40 MG tablet Take 1 tablet (40 mg total) by mouth daily at 12 noon. 30 tablet 3  . Spacer/Aero-Holding Chambers (AEROCHAMBER MV) inhaler Use as instructed 1 each 2  . traMADol (ULTRAM) 50 MG tablet Take 1 tablet (50 mg total) by mouth every 6 (six) hours as needed. (Patient not taking: Reported on 04/09/2020) 10 tablet 0  . urea (CARMOL) 20 % cream Apply 1 application topically daily as needed (For dry feet). 85 g 1   No current facility-administered medications on file prior to visit.    Past Surgical History:  Procedure Laterality Date  . [place]   as a teenager , cyst was benign  . CESAREAN SECTION  2006  . LACERATION REPAIR Right ~ 1997   "tried to cut myself"  . TOTAL KNEE ARTHROPLASTY Left 09/01/2016   Procedure: LEFT TOTAL KNEE ARTHROPLASTY;  Surgeon: Leandrew Koyanagi, MD;  Location: [facility];  Service: Orthopedics;  Laterality: Left;  . TUBAL LIGATION  2006    Allergies  Allergen Reactions  . Tomato Hives, Itching and Other (See Comments)    ALSO REACTS TO KETCHUP  . Latex Itching and Rash  . Wool Alcohol [Lanolin] Itching    Social History   Socioeconomic History  . Marital status: Married    Spouse name: Not on file  . Number of children: Not on file  . Years of education: Not on file  . Highest education level: Not on file  Occupational History  . Not on file  Tobacco Use  . Smoking status: Former Smoker    Packs/day: 0.25    Years: 21.00    Pack years: 5.25    Types: Cigarettes    Start date: 71    Quit date: 01/31/2013    Years since quitting: 7.3  . Smokeless tobacco: Never Used  Vaping Use  . Vaping Use: Never used  Substance and Sexual Activity  . 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 Drug use: No     Types: "Crack" cocaine    Comment: 01/27/2016 "clean from smoking crack since 2007"  . Sexual activity: Yes    Birth control/protection: None, Surgical  Other Topics Concern  . Not on file  Social History Narrative  . Not on file   Social Determinants of Health   Financial Resource Strain:   . Difficulty of Paying Living Expenses:   Food Insecurity:   . Worried About [employer] of  Food in the Last Year:   . [place] in the Last Year:   Transportation Needs:   . Film/video editor (Medical):   Marland Kitchen Lack of Transportation (Non-Medical):   Physical Activity:   . Days of Exercise per Week:   . Minutes of Exercise per Session:   Stress:   . Feeling of Stress :   Social Connections:   . Frequency of Communication with Friends and Family:   . Frequency of Social Gatherings with Friends and Family:   . Attends Religious Services:   . Active Member of Clubs or Organizations:   . Attends Archivist Meetings:   Marland Kitchen Marital Status:   Intimate Partner Violence:   . Fear of Current or Ex-Partner:   . Emotionally Abused:   Marland Kitchen Physically Abused:   . Sexually Abused:     Family History  Problem Relation Age of Onset  . Hypertension Mother   . HIV/AIDS Father     BP (!) 172/96   Ht '5\' 6"'  (1.676 m)   Wt 269 lb (122 kg)   BMI 43.42 kg/m   Review of Systems: See HPI above.     Objective:  Physical Exam:  Gen: NAD, comfortable in exam room  Left foot/ankle: Mild swelling 3rd digit.  No malrotation or angulation.  Mild hammer deformity of 2nd-4th digits.  No ecchymoses. Able to flex and extend digits. TTP throughout 3rd digit.  No other tenderness. NV intact distally.   Assessment & Plan:  1. Left foot injury: Consistent with a nondisplaced proximal phalanx fracture of the third digit.  She should continue buddy taping, using the hard soled shoe.  Icing, Norco if needed for severe pain.  Expect this to take 4 more weeks to heal.  Follow-up in 2 weeks.

## 2020-06-12 ENCOUNTER — Telehealth: Payer: Self-pay | Admitting: Family Medicine

## 2020-06-12 MED ORDER — OXYCODONE-ACETAMINOPHEN 7.5-325 MG PO TABS: 1.0000 | ORAL_TABLET | Freq: Four times a day (QID) | ORAL | 0 refills | Status: DC | PRN

## 2020-06-12 NOTE — Telephone Encounter (Signed)
Ok I sent percocet in instead.  Don't take the hydrocodone any longer but she doesn't need to bring them back.  Please let her know.  Thanks!

## 2020-06-12 NOTE — Telephone Encounter (Signed)
-----   Message from [facility], LAT sent at 06/12/2020 11:21 AM EDT ----- Regarding: FW: med request  ----- Message ----- From: Lizbeth Bark Sent: 06/12/2020  11:01 AM EDT To: Rutha Bouchard, LAT Subject: med request                                    Pt is calling stating that pain medication is not working. She is asking for another medication be sent to pharmacy.she has about 14 hydrocodone pills left and will bring them in to the office if needed.

## 2020-06-22 ENCOUNTER — Encounter: Payer: Self-pay | Admitting: Family Medicine

## 2020-06-22 ENCOUNTER — Other Ambulatory Visit: Payer: Self-pay

## 2020-06-22 ENCOUNTER — Ambulatory Visit
Admission: RE | Admit: 2020-06-22 | Discharge: 2020-06-22 | Disposition: A | Discharge status: Home / Self Care | Payer: Medicaid Other | Source: Ambulatory Visit | Attending: Family Medicine | Admitting: Family Medicine

## 2020-06-22 ENCOUNTER — Ambulatory Visit (INDEPENDENT_AMBULATORY_CARE_PROVIDER_SITE_OTHER): Payer: Medicaid Other | Admitting: Family Medicine

## 2020-06-22 DIAGNOSIS — S92912A Unspecified fracture of left toe(s), initial encounter for closed fracture: Secondary | ICD-10-CM | POA: Insufficient documentation

## 2020-06-22 DIAGNOSIS — S92505D Nondisplaced unspecified fracture of left lesser toe(s), subsequent encounter for fracture with routine healing: Secondary | ICD-10-CM

## 2020-06-22 DIAGNOSIS — S92515D Nondisplaced fracture of proximal phalanx of left lesser toe(s), subsequent encounter for fracture with routine healing: Secondary | ICD-10-CM | POA: Diagnosis not present

## 2020-06-22 MED ORDER — OXYCODONE-ACETAMINOPHEN 5-325 MG PO TABS: 1.0000 | ORAL_TABLET | Freq: Four times a day (QID) | ORAL | 0 refills | Status: DC | PRN

## 2020-06-22 NOTE — Assessment & Plan Note (Addendum)
Toe fracture appears to be healing well but there is some concern that the fracture may be malaligned due to patient frequently pulling on the toe and endorsing that the toe has been curling under.  There is also a bony prominence felt on the DIP, although this is similar to the right foot which is not fractured -Repeat x-rays to monitor healing -Refill pain medication -Continue buddy taping and wearing hard soled shoe -Follow-up in 2 weeks.

## 2020-06-22 NOTE — Patient Instructions (Addendum)
Get x-rays of your toe after you leave today to ensure this is healing properly and not shifting. Continue with buddy taping, postop shoe as you have been. Rarely take percocet as needed for severe pain. Follow up with me in 2 weeks for reevaluation.

## 2020-06-22 NOTE — Progress Notes (Signed)
PCP: Hoy Register, MD  Subjective:   CC: Patient is a 54 y.o. female here for 2w f/u L toe fx.  HPI:  Since last appointment patient has been mostly adherent with buddy taping and hard soled shoe wearing.  She has had great improvement in the edema of her foot and moderate improvement in the pain.  She still endorses being uncomfortable while walking and is taking her Percocet twice per day with great resolution in her pain.  Her biggest concern today is that she feels that her toe is curling under which is uncomfortable so she has been pulling on her toe to straighten it out frequently.     Objective:  BP (!) 150/99    Ht 5\' 6"  (1.676 m)    Wt 265 lb (120.2 kg)    BMI 42.77 kg/m   Physical Exam: Gen: NAD, comfortable in exam room Left foot: Observation: Third digit positive for mildly sustained flexion which is symmetrical to right foot Palpation: Tenderness to palpation of DIP where there is a small bony protrusion symmetrical to right foot.  Negative for edema ROM: Able to flex and extend all toes normally Strength: Decreased resisted flexion of third digit Sensation: Endorses tingling sensation when palpating the toe   Assessment & Plan:  Toe fracture, left Toe fracture appears to be healing well but there is some concern that the fracture may be malaligned due to patient frequently pulling on the toe and endorsing that the toe has been curling under.  There is also a bony prominence felt on the DIP, although this is similar to the right foot which is not fractured -Repeat x-rays to monitor healing -Refill pain medication -Continue buddy taping and wearing hard soled shoe -Follow-up in 2 weeks.  , DO [facility] PGY-3

## 2020-06-23 ENCOUNTER — Telehealth: Payer: Self-pay | Admitting: Pulmonary Disease

## 2020-06-23 ENCOUNTER — Other Ambulatory Visit: Payer: Self-pay | Admitting: Primary Care

## 2020-06-23 NOTE — Telephone Encounter (Signed)
ATC Patient.  LM to call back. 

## 2020-06-24 MED ORDER — PREDNISONE 10 MG PO TABS
ORAL_TABLET | ORAL | 0 refills | Status: DC
Start: 2020-06-24 — End: 2020-07-06

## 2020-06-24 MED ORDER — EPINEPHRINE 0.3 MG/0.3ML IJ SOAJ
0.3000 mg | Freq: Once | INTRAMUSCULAR | 11 refills | Status: DC
Start: 2020-06-24 — End: 2020-06-24

## 2020-06-24 NOTE — Telephone Encounter (Signed)
Please send script for prednisone 10 mg pill >> 3 pills daily for 2 days, 2 pills daily for 2 days, 1 pill daily for 2 days. 

## 2020-06-24 NOTE — Telephone Encounter (Signed)
Called and spoke with patient. Patient stated she is having a asthma flare.  Patient stated she is having cold like symptoms, cough, with tan phlegm, chest tightness, and some wheezing.  Patient denies any fever or chills. Patient denies any loss of taste or smell. Patient stated she had both Moderna Covid vaccines, and is waiting to get her booster. Patient stated she would like to get scheduled to start her Fasenra injections as recommended.  Patient scheduled 07/09/20, at 0930 for first Fasenra injection, office protocol reviewed, and Epipen prescription sent to requested [facility] [address]. Patient has OV scheduled with Waynetta Sandy, NP 06/26/20. Patient is requesting prednisone to help with cold/asthma symptoms.    Message routed to Dr Craige Cotta

## 2020-06-24 NOTE — Telephone Encounter (Signed)
Patient called.  Dr Evlyn Courier recommendations given.  Understanding stated.  [facility]one prescription sent to [facility].  Nothing further at this time.

## 2020-06-24 NOTE — Telephone Encounter (Signed)
Patient is returning phone call. Patient phone number is [phone].

## 2020-06-26 ENCOUNTER — Ambulatory Visit: Payer: Medicaid Other | Admitting: Primary Care

## 2020-06-26 NOTE — Progress Notes (Deleted)
'@Patient'  ID: Kathleen Rodriguez, female    DOB: [birth year], 54 y.o.   MRN: [id number]  No chief complaint on file.   Referring provider: [name], MD  HPI: 54 year old female, former smoker.  Past medical history significant for severe persistent asthma, upper airway cough syndrome, chronic respiratory failure, obstructive sleep apnea, hypertension, GERD, anemia, prediabetes, obesity. Patient of Dr. Halford Chessman, last seen by pulmonary nurse practitioner on 12/31/2019. Maintained on Dulera, Singulair/Claritin, prn albuterol.   Previous [facility] pulmonary encounter: 12/31/19- NP, [place]  54 year old female former smoker followed for allergic asthma, postinfectious related bronchiectasis and struct of sleep apnea Medical history significant for polysubstance-cocaine Allergic reaction to Xolair . Today's televisit is for a 8-monthfollow-up for asthma.  Last visit patient had a severe asthma exacerbation and was admitted to the hospital.  She was treated with nebulized bronchodilators and steroids.  Patient says she did improve however 2 weeks ago she started having some flare of her asthma symptoms with wheezing.  She was called in a prednisone taper.  Patient says she is improved.  She has a couple days left of her prednisone.  She says overall she is doing okay.  She had a lot of stress with her family.  Her father recently passed away and her mother is ill.  [place] her.  Denies chest pain, orthopnea or leg swelling.  Says she uses her albuterol inhaler a few times a week.  She remains on Dulera twice daily. Previous allergic reaction to Xolair . Was recommended to start on Fasenra , but has not come in for injections.  She says she will start this when she gets back from getting her mom settled here in next couple of weeks.  Has OSA , says she wears CPAP most nights.   06/26/2020 Patient presents today for 6 month follow-up.  Plan to set up [name]         Allergies  Allergen Reactions  . Tomato Hives, Itching and Other (See Comments)    ALSO REACTS TO KETCHUP  . Latex Itching and Rash  . Wool Alcohol [Lanolin] Itching    Immunization History  Administered Date(s) Administered  . Influenza,inj,Quad PF,6+ Mos 08/13/2013, 07/27/2015, 07/21/2016, 10/22/2017, 08/01/2018, 06/17/2019  . Pneumococcal Polysaccharide-23 06/17/2019  . Tdap 08/14/2013    Past Medical History:  Diagnosis Date  . Arthritis    "knees, lower back; legs, ankles" (01/27/2016)  . Asthma    followed by Dr. SHalford Chessman . CHF (congestive heart failure) ([facility]   "when I went into a coma"  . Cocaine abuse ([place]   . 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 Dyspnea   . GERD (gastroesophageal reflux disease)   . Hypertension    "doctor took me off RX in 2016" (01/27/2016)  . Influenza B April 23545  Complicated by multi-organ failure  . OSA on CPAP "since " 03/20/2013  . Pneumonia 2016  . Required emergent intubation    asthma exacerbation in 2016  . Tobacco abuse   . Upper airway cough syndrome     Tobacco History: Social History   Tobacco Use  Smoking Status Former Smoker  . Packs/day: 0.25  . Years: 21.00  . Pack years: 5.25  . Types: Cigarettes  . Start date: 162 . Quit date: 01/31/2013  . Years since quitting: 7.4  Smokeless Tobacco Never Used   Counseling given: Not Answered  Outpatient Medications Prior to Visit  Medication Sig Dispense Refill  . acetaminophen (TYLENOL) 500 MG tablet Take 500 mg by mouth every 6 (six) hours as needed for mild pain.    Marland Kitchen albuterol (PROVENTIL) (2.5 MG/3ML) 0.083% nebulizer solution Take 3 mLs (2.5 mg total) by nebulization every 6 (six) hours as needed for wheezing or shortness of breath. 360 mL 2  . albuterol (VENTOLIN HFA) 108 (90 Base) MCG/ACT inhaler Inhale 2 puffs into the lungs every 6 (six) hours as needed for wheezing or shortness of breath. 6.7 g 5  . aspirin 81 MG EC  tablet Take 1 tablet (81 mg total) by mouth daily. 30 tablet 3  . Blood Glucose Monitoring Suppl (ACCU-CHEK GUIDE ME) w/Device KIT 1 each by Does not apply route daily. Dx; Prediabetets; Prednisone use 1 kit 0  . Camphor-Eucalyptus-Menthol (VICKS VAPORUB EX) Apply 1 application topically daily as needed (congestion).    . cloNIDine (CATAPRES) 0.1 MG tablet Take 1 tablet (0.1 mg total) by mouth 2 (two) times daily. Must keep office visit tomorrow for additional refills. 60 tablet 0  . fluticasone (FLONASE) 50 MCG/ACT nasal spray Place 1 spray into both nostrils daily. (Patient taking differently: Place 1 spray into both nostrils daily as needed for allergies. ) 16 g 5  . gabapentin (NEURONTIN) 300 MG capsule Take 2 capsules (600 mg total) by mouth 2 (two) times daily. 120 capsule 3  . gabapentin (NEURONTIN) 600 MG tablet Take 1 tablet (600 mg total) by mouth 3 (three) times daily. 90 tablet 0  . glucose blood (ACCU-CHEK GUIDE) test strip Use as instructed daily before breakfast. Dx; Prediabetets; Prednisone use 30 each 12  . HYDROcodone-acetaminophen (NORCO) 5-325 MG tablet Take 1 tablet by mouth every 6 (six) hours as needed for moderate pain. 20 tablet 0  . ipratropium (ATROVENT) 0.02 % nebulizer solution Take 2.5 mLs (0.5 mg total) by nebulization 2 (two) times daily as needed for wheezing or shortness of breath. 360 mL 5  . loratadine (CLARITIN) 10 MG tablet Take 1 tablet (10 mg total) by mouth daily. 30 tablet 11  . meloxicam (MOBIC) 7.5 MG tablet Take 1 tablet (7.5 mg total) by mouth daily. 30 tablet 1  . Menthol (HALLS COUGH DROPS MT) Use as directed 1 drop in the mouth or throat 2 (two) times daily as needed (sore throat and cough).    . metFORMIN (GLUCOPHAGE) 500 MG tablet Take 1 tablet (500 mg total) by mouth 2 (two) times daily with a meal. Dx; Prediabetes 60 tablet 6  . methocarbamol (ROBAXIN) 500 MG tablet TAKE 1 TABLET (500 MG TOTAL) BY MOUTH EVERY 8 (EIGHT) HOURS AS NEEDED FOR MUSCLE  SPASMS. 60 tablet 0  . Misc. Devices MISC Blood pressure monitor  Dx: Hypertension 1 each 0  . mometasone-formoterol (DULERA) 200-5 MCG/ACT AERO Inhale 2 puffs into the lungs 2 (two) times daily. 8.8 g 5  . montelukast (SINGULAIR) 10 MG tablet Take 1 tablet (10 mg total) by mouth at bedtime. 90 tablet 1  . oxyCODONE-acetaminophen (PERCOCET/ROXICET) 5-325 MG tablet Take 1 tablet by mouth every 6 (six) hours as needed for severe pain. 20 tablet 0  . pantoprazole (PROTONIX) 40 MG tablet Take 1 tablet (40 mg total) by mouth daily at 12 noon. 30 tablet 3  . predniSONE (DELTASONE) 10 MG tablet Take 3 tabs x's2days,2tabsx's 2 days,1tab x's 2 days 12 tablet 0  . Spacer/Aero-Holding Chambers (AEROCHAMBER MV) inhaler Use as instructed 1 each 2  . traMADol (ULTRAM) 50  MG tablet Take 1 tablet (50 mg total) by mouth every 6 (six) hours as needed. (Patient not taking: Reported on 04/09/2020) 10 tablet 0  . urea (CARMOL) 20 % cream Apply 1 application topically daily as needed (For dry feet). 85 g 1   No facility-administered medications prior to visit.      Review of Systems  Review of Systems   Physical Exam  There were no vitals taken for this visit. Physical Exam   Lab Results:  CBC    Component Value Date/Time   WBC 7.4 10/10/2019 0250   RBC 4.73 10/10/2019 0250   HGB 13.3 10/10/2019 0250   HGB 13.3 11/07/2018 1041   HCT 42.6 10/10/2019 0250   HCT 42.2 11/07/2018 1041   PLT 289 10/10/2019 0250   PLT 347 11/07/2018 1041   MCV 90.1 10/10/2019 0250   MCV 87 11/07/2018 1041   MCH 28.1 10/10/2019 0250   MCHC 31.2 10/10/2019 0250   RDW 17.2 (H) 10/10/2019 0250   RDW 15.2 11/07/2018 1041   LYMPHSABS 2.5 10/09/2019 1812   LYMPHSABS 2.6 11/07/2018 1041   MONOABS 0.7 10/09/2019 1812   EOSABS 0.5 10/09/2019 1812   EOSABS 0.3 11/07/2018 1041   BASOSABS 0.1 10/09/2019 1812   BASOSABS 0.1 11/07/2018 1041    BMET    Component Value Date/Time   NA 139 10/11/2019 0330   NA 140  11/07/2018 1041   K 4.9 10/11/2019 0330   CL 103 10/11/2019 0330   CO2 26 10/11/2019 0330   GLUCOSE 123 (H) 10/11/2019 0330   BUN 14 10/11/2019 0330   BUN 13 11/07/2018 1041   CREATININE 0.87 10/11/2019 0330   CALCIUM 8.6 (L) 10/11/2019 0330   GFRNONAA >60 10/11/2019 0330   GFRAA >60 10/11/2019 0330    BNP    Component Value Date/Time   BNP 40.0 10/09/2019 1812    ProBNP    Component Value Date/Time   PROBNP 97.2 07/01/2014 1740    Imaging: DG Toe 3rd Left  Result Date: 06/22/2020 CLINICAL DATA:  History of the left third toe fracture. Subsequent encounter. EXAM: LEFT THIRD TOE COMPARISON:  Plain films left foot 05/26/2020. FINDINGS: Nondisplaced fracture of the distal metaphysis of the proximal phalanx of the left third toe seen on the prior examination demonstrates progressive healing. Fracture line is barely perceptible. No new abnormality is identified. IMPRESSION: Healing nondisplaced fracture of the proximal phalanx of the left third toe. Electronically Signed   By: Inge Rise M.D.   On: 06/22/2020 16:11     Assessment & Plan:   No problem-specific Assessment & Plan notes found for this encounter.     Martyn Ehrich, NP 06/26/2020

## 2020-06-30 ENCOUNTER — Telehealth: Payer: Self-pay | Admitting: Podiatrist

## 2020-06-30 NOTE — Telephone Encounter (Signed)
Pt left message wanting to get scheduled for diabetic shoes.  I returned call and left message for pt that we are not able to do them in our office due to her insurance but we could give her a rx when she is in to see Dr Irving Shows to go to [employer]).

## 2020-07-02 ENCOUNTER — Inpatient Hospital Stay (HOSPITAL_COMMUNITY)
Admission: EM | Admit: 2020-07-02 | Discharge: 2020-07-06 | DRG: 203 | Disposition: A | Discharge status: Home / Self Care | Payer: Medicaid Other | Attending: Internal Medicine | Admitting: Internal Medicine

## 2020-07-02 ENCOUNTER — Encounter (HOSPITAL_COMMUNITY): Payer: Self-pay | Admitting: Emergency Medicine

## 2020-07-02 ENCOUNTER — Emergency Department (HOSPITAL_COMMUNITY): Payer: Medicaid Other

## 2020-07-02 ENCOUNTER — Other Ambulatory Visit: Payer: Self-pay

## 2020-07-02 DIAGNOSIS — Z96652 Presence of left artificial knee joint: Secondary | ICD-10-CM | POA: Diagnosis not present

## 2020-07-02 DIAGNOSIS — R05 Cough: Secondary | ICD-10-CM | POA: Diagnosis not present

## 2020-07-02 DIAGNOSIS — I10 Essential (primary) hypertension: Secondary | ICD-10-CM | POA: Diagnosis present

## 2020-07-02 DIAGNOSIS — Z7951 Long term (current) use of inhaled steroids: Secondary | ICD-10-CM | POA: Diagnosis not present

## 2020-07-02 DIAGNOSIS — J45901 Unspecified asthma with (acute) exacerbation: Secondary | ICD-10-CM | POA: Diagnosis present

## 2020-07-02 DIAGNOSIS — R7303 Prediabetes: Secondary | ICD-10-CM | POA: Diagnosis not present

## 2020-07-02 DIAGNOSIS — Z91040 Latex allergy status: Secondary | ICD-10-CM | POA: Diagnosis not present

## 2020-07-02 DIAGNOSIS — I509 Heart failure, unspecified: Secondary | ICD-10-CM | POA: Diagnosis present

## 2020-07-02 DIAGNOSIS — J4551 Severe persistent asthma with (acute) exacerbation: Secondary | ICD-10-CM | POA: Diagnosis not present

## 2020-07-02 DIAGNOSIS — Z91048 Other nonmedicinal substance allergy status: Secondary | ICD-10-CM

## 2020-07-02 DIAGNOSIS — R062 Wheezing: Secondary | ICD-10-CM | POA: Diagnosis not present

## 2020-07-02 DIAGNOSIS — M19071 Primary osteoarthritis, right ankle and foot: Secondary | ICD-10-CM | POA: Diagnosis present

## 2020-07-02 DIAGNOSIS — Z8701 Personal history of pneumonia (recurrent): Secondary | ICD-10-CM | POA: Diagnosis not present

## 2020-07-02 DIAGNOSIS — Z8249 Family history of ischemic heart disease and other diseases of the circulatory system: Secondary | ICD-10-CM

## 2020-07-02 DIAGNOSIS — G4733 Obstructive sleep apnea (adult) (pediatric): Secondary | ICD-10-CM | POA: Diagnosis present

## 2020-07-02 DIAGNOSIS — M170 Bilateral primary osteoarthritis of knee: Secondary | ICD-10-CM | POA: Diagnosis not present

## 2020-07-02 DIAGNOSIS — Z791 Long term (current) use of non-steroidal anti-inflammatories (NSAID): Secondary | ICD-10-CM

## 2020-07-02 DIAGNOSIS — Z87891 Personal history of nicotine dependence: Secondary | ICD-10-CM | POA: Diagnosis not present

## 2020-07-02 DIAGNOSIS — Z91018 Allergy to other foods: Secondary | ICD-10-CM

## 2020-07-02 DIAGNOSIS — Z7982 Long term (current) use of aspirin: Secondary | ICD-10-CM | POA: Diagnosis not present

## 2020-07-02 DIAGNOSIS — Z7984 Long term (current) use of oral hypoglycemic drugs: Secondary | ICD-10-CM

## 2020-07-02 DIAGNOSIS — K219 Gastro-esophageal reflux disease without esophagitis: Secondary | ICD-10-CM | POA: Diagnosis not present

## 2020-07-02 DIAGNOSIS — Z20822 Contact with and (suspected) exposure to covid-19: Secondary | ICD-10-CM | POA: Diagnosis present

## 2020-07-02 DIAGNOSIS — Z8619 Personal history of other infectious and parasitic diseases: Secondary | ICD-10-CM | POA: Diagnosis not present

## 2020-07-02 DIAGNOSIS — R079 Chest pain, unspecified: Secondary | ICD-10-CM | POA: Diagnosis not present

## 2020-07-02 DIAGNOSIS — R0689 Other abnormalities of breathing: Secondary | ICD-10-CM | POA: Diagnosis not present

## 2020-07-02 DIAGNOSIS — M19072 Primary osteoarthritis, left ankle and foot: Secondary | ICD-10-CM | POA: Diagnosis present

## 2020-07-02 DIAGNOSIS — R0602 Shortness of breath: Secondary | ICD-10-CM | POA: Diagnosis not present

## 2020-07-02 DIAGNOSIS — I110 Hypertensive heart disease with heart failure: Secondary | ICD-10-CM | POA: Diagnosis not present

## 2020-07-02 DIAGNOSIS — Z79899 Other long term (current) drug therapy: Secondary | ICD-10-CM | POA: Diagnosis not present

## 2020-07-02 DIAGNOSIS — M47816 Spondylosis without myelopathy or radiculopathy, lumbar region: Secondary | ICD-10-CM | POA: Diagnosis present

## 2020-07-02 HISTORY — DX: Type 2 diabetes mellitus without complications: E11.9

## 2020-07-02 LAB — CBC WITH DIFFERENTIAL/PLATELET
Abs Immature Granulocytes: 0.01 10*3/uL (ref 0.00–0.07)
Basophils Absolute: 0.1 10*3/uL (ref 0.0–0.1)
Basophils Relative: 1 %
Eosinophils Absolute: 0.6 10*3/uL — ABNORMAL HIGH (ref 0.0–0.5)
Eosinophils Relative: 8 %
HCT: 47.4 % — ABNORMAL HIGH (ref 36.0–46.0)
Hemoglobin: 14.4 g/dL (ref 12.0–15.0)
Immature Granulocytes: 0 %
Lymphocytes Relative: 45 %
Lymphs Abs: 3.5 10*3/uL (ref 0.7–4.0)
MCH: 27.1 pg (ref 26.0–34.0)
MCHC: 30.4 g/dL (ref 30.0–36.0)
MCV: 89.1 fL (ref 80.0–100.0)
Monocytes Absolute: 0.7 10*3/uL (ref 0.1–1.0)
Monocytes Relative: 10 %
Neutro Abs: 2.7 10*3/uL (ref 1.7–7.7)
Neutrophils Relative %: 36 %
Platelets: 287 10*3/uL (ref 150–400)
RBC: 5.32 MIL/uL — ABNORMAL HIGH (ref 3.87–5.11)
RDW: 16 % — ABNORMAL HIGH (ref 11.5–15.5)
WBC: 7.7 10*3/uL (ref 4.0–10.5)
nRBC: 0 % (ref 0.0–0.2)

## 2020-07-02 LAB — BASIC METABOLIC PANEL
Anion gap: 10 (ref 5–15)
BUN: 10 mg/dL (ref 6–20)
CO2: 27 mmol/L (ref 22–32)
Calcium: 8.8 mg/dL — ABNORMAL LOW (ref 8.9–10.3)
Chloride: 105 mmol/L (ref 98–111)
Creatinine, Ser: 0.94 mg/dL (ref 0.44–1.00)
GFR calc Af Amer: >60 mL/min (ref >60)
GFR calc non Af Amer: >60 mL/min (ref >60)
Glucose, Bld: 105 mg/dL — ABNORMAL HIGH (ref 70–99)
Potassium: 4.2 mmol/L (ref 3.5–5.1)
Sodium: 142 mmol/L (ref 135–145)

## 2020-07-02 LAB — BRAIN NATRIURETIC PEPTIDE: B Natriuretic Peptide: 27 pg/mL (ref 0.0–100.0)

## 2020-07-02 LAB — SARS CORONAVIRUS 2 BY RT PCR (HOSPITAL ORDER, PERFORMED IN ~~LOC~~ HOSPITAL LAB): SARS Coronavirus 2: NEGATIVE

## 2020-07-02 LAB — MAGNESIUM: Magnesium: 2.6 mg/dL — ABNORMAL HIGH (ref 1.7–2.4)

## 2020-07-02 LAB — TROPONIN I (HIGH SENSITIVITY)
Troponin I (High Sensitivity): 7 ng/L (ref <18)
Troponin I (High Sensitivity): 6 ng/L (ref <18)

## 2020-07-02 MED ORDER — MONTELUKAST SODIUM 10 MG PO TABS
10.0000 mg | ORAL_TABLET | Freq: Every day | ORAL | Status: DC
  Administered 2020-07-04 – 2020-07-05 (×3): 10 mg via ORAL
  Filled 2020-07-02 (×4): qty 1

## 2020-07-02 MED ORDER — POLYETHYLENE GLYCOL 3350 17 G PO PACK: 17.0000 g | PACK | Freq: Every day | ORAL | Status: DC | PRN

## 2020-07-02 MED ORDER — IPRATROPIUM BROMIDE 0.02 % IN SOLN
0.5000 mg | Freq: Once | RESPIRATORY_TRACT | Status: AC
  Administered 2020-07-02: 0.5 mg via RESPIRATORY_TRACT
  Filled 2020-07-02: qty 2.5

## 2020-07-02 MED ORDER — INSULIN ASPART 100 UNIT/ML ~~LOC~~ SOLN
0.0000 [IU] | Freq: Three times a day (TID) | SUBCUTANEOUS | Status: DC
  Administered 2020-07-03: 3 [IU] via SUBCUTANEOUS
  Administered 2020-07-03: 2 [IU] via SUBCUTANEOUS
  Administered 2020-07-04 (×2): 1 [IU] via SUBCUTANEOUS
  Administered 2020-07-05 (×2): 2 [IU] via SUBCUTANEOUS
  Administered 2020-07-06: 1 [IU] via SUBCUTANEOUS

## 2020-07-02 MED ORDER — ALBUTEROL SULFATE HFA 108 (90 BASE) MCG/ACT IN AERS
4.0000 | INHALATION_SPRAY | Freq: Once | RESPIRATORY_TRACT | Status: DC
  Filled 2020-07-02: qty 6.7

## 2020-07-02 MED ORDER — HYDROCODONE-ACETAMINOPHEN 5-325 MG PO TABS: 1.0000 | ORAL_TABLET | Freq: Four times a day (QID) | ORAL | Status: DC | PRN

## 2020-07-02 MED ORDER — ALBUTEROL SULFATE HFA 108 (90 BASE) MCG/ACT IN AERS
2.0000 | INHALATION_SPRAY | RESPIRATORY_TRACT | Status: DC | PRN
  Filled 2020-07-02: qty 6.7

## 2020-07-02 MED ORDER — ONDANSETRON HCL 4 MG/2ML IJ SOLN: 4.0000 mg | Freq: Four times a day (QID) | INTRAMUSCULAR | Status: DC | PRN

## 2020-07-02 MED ORDER — METHYLPREDNISOLONE SODIUM SUCC 40 MG IJ SOLR
40.0000 mg | Freq: Three times a day (TID) | INTRAMUSCULAR | Status: DC
  Administered 2020-07-03 – 2020-07-05 (×9): 40 mg via INTRAVENOUS
  Filled 2020-07-02 (×9): qty 1

## 2020-07-02 MED ORDER — ACETAMINOPHEN 650 MG RE SUPP: 650.0000 mg | Freq: Four times a day (QID) | RECTAL | Status: DC | PRN

## 2020-07-02 MED ORDER — ALBUTEROL (5 MG/ML) CONTINUOUS INHALATION SOLN: 15.0000 mg/h | INHALATION_SOLUTION | Freq: Once | RESPIRATORY_TRACT | Status: AC

## 2020-07-02 MED ORDER — ENOXAPARIN SODIUM 40 MG/0.4ML ~~LOC~~ SOLN
40.0000 mg | Freq: Every day | SUBCUTANEOUS | Status: DC
  Administered 2020-07-03 – 2020-07-06 (×4): 40 mg via SUBCUTANEOUS
  Filled 2020-07-02 (×4): qty 0.4

## 2020-07-02 MED ORDER — SODIUM CHLORIDE 0.9% FLUSH
3.0000 mL | Freq: Two times a day (BID) | INTRAVENOUS | Status: DC
  Administered 2020-07-03 – 2020-07-05 (×5): 3 mL via INTRAVENOUS

## 2020-07-02 MED ORDER — SODIUM CHLORIDE 0.9 % IV SOLN: 250.0000 mL | INTRAVENOUS | Status: DC | PRN

## 2020-07-02 MED ORDER — SODIUM CHLORIDE 0.9% FLUSH
3.0000 mL | Freq: Two times a day (BID) | INTRAVENOUS | Status: DC
  Administered 2020-07-03 – 2020-07-06 (×8): 3 mL via INTRAVENOUS

## 2020-07-02 MED ORDER — ALBUTEROL (5 MG/ML) CONTINUOUS INHALATION SOLN
INHALATION_SOLUTION | RESPIRATORY_TRACT | Status: AC
  Administered 2020-07-02: 15 mg/h via RESPIRATORY_TRACT
  Filled 2020-07-02: qty 20

## 2020-07-02 MED ORDER — MOMETASONE FURO-FORMOTEROL FUM 200-5 MCG/ACT IN AERO
2.0000 | INHALATION_SPRAY | Freq: Two times a day (BID) | RESPIRATORY_TRACT | Status: DC
  Administered 2020-07-03 – 2020-07-06 (×7): 2 via RESPIRATORY_TRACT
  Filled 2020-07-02: qty 8.8

## 2020-07-02 MED ORDER — ONDANSETRON HCL 4 MG PO TABS
4.0000 mg | ORAL_TABLET | Freq: Four times a day (QID) | ORAL | Status: DC | PRN
  Administered 2020-07-03: 4 mg via ORAL
  Filled 2020-07-02: qty 1

## 2020-07-02 MED ORDER — ACETAMINOPHEN 325 MG PO TABS
650.0000 mg | ORAL_TABLET | Freq: Four times a day (QID) | ORAL | Status: DC | PRN
  Administered 2020-07-03: 650 mg via ORAL
  Filled 2020-07-02: qty 2

## 2020-07-02 MED ORDER — SODIUM CHLORIDE 0.9% FLUSH
3.0000 mL | INTRAVENOUS | Status: DC | PRN
  Administered 2020-07-06: 3 mL via INTRAVENOUS

## 2020-07-02 NOTE — ED Triage Notes (Signed)
Pt BIB [facility] from home, c/o increased shortness of breath x 2 days, using home inhalers with no relief. SpO2 90% on room air, EMS placed pt on NRB. Speaking in short phrases, given 10mg  albuterol, 0.5mg  atrovent, 125mg  solumedrol and 2g mag pta.

## 2020-07-02 NOTE — H&P (Signed)
History and Physical    CHALSEA DARKO TIR:[id number] DOB: [birth year] DOA: 07/02/2020  PCP: [name], MD   Patient coming from: Home   Chief Complaint: SOB, wheezing, chest tightness   HPI: Kathleen Rodriguez is a 54 y.o. female with medical history significant for severe persistent asthma, hypertension, prediabetes, OSA on CPAP, and remote history of alcohol and substance abuse now presenting to emergency department for evaluation of shortness of breath, wheezing, and chest tightness.  Patient reports that she was recently treated for an asthma exacerbation, had just about returned to baseline when she finished her prednisone a couple days ago, but then quickly began to worsen again.  She has had progressive dyspnea, chest tightness, and wheezing despite using her breathing treatments at home.  She denies any associated fevers or chills, denies chest pain, denies palpitations, denies leg swelling or tenderness, and denies any hemoptysis or sputum production.  EMS was called and the patient was treated with 125 mg of IV Solu-Medrol, 2 g IV magnesium, and duo nebs prior to arrival in the ED.  ED Course: Upon arrival to the ED, patient is found to be afebrile, saturating 100% on BiPAP, tachypneic, and with stable blood pressure.  EKG features a sinus rhythm and chest x-rays negative for acute cardiopulmonary disease.  COVID-19 PCR is negative.  Chemistry panel and CBC are unremarkable.  BNP and troponin are normal.  Patient was placed on BiPAP in the emergency department and given additional albuterol and Atrovent treatments.   Review of Systems:  All other systems reviewed and apart from HPI, are negative.  Past Medical History:  Diagnosis Date  . Arthritis    "knees, lower back; legs, ankles" (01/27/2016)  . Asthma    followed by Dr. Halford Chessman  . CHF (congestive heart failure) ([place]) 2016   "when I went into a coma"  . Cocaine abuse ([place])   . 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 Dyspnea   .  GERD (gastroesophageal reflux disease)   . Hypertension    "doctor took me off RX in 2016" (01/27/2016)  . Influenza B April 6761   Complicated by multi-organ failure  . OSA on CPAP "since " 03/20/2013  . Pneumonia 2016  . Required emergent intubation    asthma exacerbation in 2016  . Tobacco abuse   . Upper airway cough syndrome     Past Surgical History:  Procedure Laterality Date  . [place]   as a teenager , cyst was benign  . CESAREAN SECTION  2006  . LACERATION REPAIR Right ~ 1997   "tried to cut myself"  . TOTAL KNEE ARTHROPLASTY Left 09/01/2016   Procedure: LEFT TOTAL KNEE ARTHROPLASTY;  Surgeon: Leandrew Koyanagi, MD;  Location: [facility];  Service: Orthopedics;  Laterality: Left;  . TUBAL LIGATION  2006    Social History:   reports that she quit smoking about 7 years ago. Her smoking use included cigarettes. She started smoking about 28 years ago. She has a 5.25 pack-year smoking history. She has never used smokeless tobacco. She reports that she does not drink alcohol and does not use drugs.  Allergies  Allergen Reactions  . Tomato Hives, Itching and Other (See Comments)    ALSO REACTS TO KETCHUP  . Latex Itching and Rash  . Wool Alcohol [Lanolin] Itching    Family History  Problem Relation Age of Onset  . Hypertension Mother   . HIV/AIDS Father      Prior to Admission  medications   Medication Sig Start Date End Date Taking? Authorizing Provider  acetaminophen (TYLENOL) 500 MG tablet Take 500 mg by mouth every 6 (six) hours as needed for mild pain.    [provider]  albuterol (PROVENTIL) (2.5 MG/3ML) 0.083% nebulizer solution Take 3 mLs (2.5 mg total) by nebulization every 6 (six) hours as needed for wheezing or shortness of breath. 10/11/19   Chesley Mires, MD  albuterol (VENTOLIN HFA) 108 (90 Base) MCG/ACT inhaler Inhale 2 puffs into the lungs every 6 (six) hours as needed for wheezing or shortness of breath. 10/11/19   Chesley Mires, MD    aspirin 81 MG EC tablet Take 1 tablet (81 mg total) by mouth daily. 02/07/19   [name], MD  Blood Glucose Monitoring Suppl (ACCU-CHEK GUIDE ME) w/Device KIT 1 each by Does not apply route daily. Dx; Prediabetets; Prednisone use 10/30/19   [name], MD  Camphor-Eucalyptus-Menthol (VICKS VAPORUB EX) Apply 1 application topically daily as needed (congestion).    [provider]  cloNIDine (CATAPRES) 0.1 MG tablet Take 1 tablet (0.1 mg total) by mouth 2 (two) times daily. Must keep office visit tomorrow for additional refills. 10/29/19   [name], MD  fluticasone (FLONASE) 50 MCG/ACT nasal spray Place 1 spray into both nostrils daily. Patient taking differently: Place 1 spray into both nostrils daily as needed for allergies.  02/19/18   Chesley Mires, MD  gabapentin (NEURONTIN) 300 MG capsule Take 2 capsules (600 mg total) by mouth 2 (two) times daily. 11/28/19   [name], MD  gabapentin (NEURONTIN) 600 MG tablet Take 1 tablet (600 mg total) by mouth 3 (three) times daily. 04/14/20 05/14/20  Bronson Ing, DPM  glucose blood (ACCU-CHEK GUIDE) test strip Use as instructed daily before breakfast. Dx; Prediabetets; Prednisone use 10/30/19   [name], MD  HYDROcodone-acetaminophen (NORCO) 5-325 MG tablet Take 1 tablet by mouth every 6 (six) hours as needed for moderate pain. 06/08/20   Hudnall, Sharyn Lull, MD  ipratropium (ATROVENT) 0.02 % nebulizer solution Take 2.5 mLs (0.5 mg total) by nebulization 2 (two) times daily as needed for wheezing or shortness of breath. 10/11/19   Chesley Mires, MD  loratadine (CLARITIN) 10 MG tablet Take 1 tablet (10 mg total) by mouth daily. 09/09/19   Chesley Mires, MD  meloxicam (MOBIC) 7.5 MG tablet Take 1 tablet (7.5 mg total) by mouth daily. 04/23/19   [name], MD  Menthol (HALLS COUGH DROPS MT) Use as directed 1 drop in the mouth or throat 2 (two) times daily as needed (sore throat and cough).    [provider]  metFORMIN  (GLUCOPHAGE) 500 MG tablet Take 1 tablet (500 mg total) by mouth 2 (two) times daily with a meal. Dx; Prediabetes 10/30/19   [name], MD  methocarbamol (ROBAXIN) 500 MG tablet TAKE 1 TABLET (500 MG TOTAL) BY MOUTH EVERY 8 (EIGHT) HOURS AS NEEDED FOR MUSCLE SPASMS. 03/16/20   [name], MD  Misc. Devices MISC Blood pressure monitor  Dx: Hypertension 02/07/19   [name], MD  mometasone-formoterol (DULERA) 200-5 MCG/ACT AERO Inhale 2 puffs into the lungs 2 (two) times daily. 10/11/19   Chesley Mires, MD  montelukast (SINGULAIR) 10 MG tablet Take 1 tablet (10 mg total) by mouth at bedtime. 04/30/19   Martyn Ehrich, NP  oxyCODONE-acetaminophen (PERCOCET/ROXICET) 5-325 MG tablet Take 1 tablet by mouth every 6 (six) hours as needed for severe pain. 06/22/20   Hudnall, Sharyn Lull, MD  pantoprazole (PROTONIX) 40 MG  tablet Take 1 tablet (40 mg total) by mouth daily at 12 noon. 02/07/19   [name], MD  predniSONE (DELTASONE) 10 MG tablet Take 3 tabs x's2days,2tabsx's 2 days,1tab x's 2 days 06/24/20   Chesley Mires, MD  Spacer/Aero-Holding Chambers (AEROCHAMBER MV) inhaler Use as instructed 07/11/18   Chesley Mires, MD  traMADol (ULTRAM) 50 MG tablet Take 1 tablet (50 mg total) by mouth every 6 (six) hours as needed. Patient not taking: Reported on 04/09/2020 09/01/19   Henderly, Britni A, PA-C  urea (CARMOL) 20 % cream Apply 1 application topically daily as needed (For dry feet). 10/15/19   [name], MD    Physical Exam: Vitals:   07/02/20 2130 07/02/20 2200 07/02/20 2230 07/02/20 2300  BP: (!) 155/93 (!) 163/78 (!) 176/92 118/83  Pulse: 90 (!) 102 (!) 109 (!) 106  Resp: 19 (!) '23 20 20  ' Temp:      TempSrc:      SpO2: 100% 100% 100% 99%    Constitutional: On BiPAP, calm  Eyes: PERTLA, lids and conjunctivae normal ENMT: Mucous membranes are moist. Posterior pharynx clear of any exudate or lesions.   Neck: normal, supple, no masses, no thyromegaly Respiratory: Diminished breath  sounds bilaterally with prolonged expiratory phase and wheezes. Increased WOB. No pallor or cyanosis.  Cardiovascular: S1 & S2 heard, regular rate and rhythm. Trace lower leg swelling bialterally.   Abdomen: No distension, no tenderness, soft. Bowel sounds active.  Musculoskeletal: no clubbing / cyanosis. No joint deformity upper and lower extremities.   Skin: no significant rashes, lesions, ulcers. Warm, dry, well-perfused. Neurologic: CN 2-12 grossly intact. Sensation intact. Moving all extremities.  Psychiatric: Alert and oriented to person, place, and situation. Pleasant and cooperative.    Labs and Imaging on Admission: I have personally reviewed following labs and imaging studies  CBC: Recent Labs  Lab 07/02/20 2022  WBC 7.7  NEUTROABS 2.7  HGB 14.4  HCT 47.4*  MCV 89.1  PLT 100   Basic Metabolic Panel: Recent Labs  Lab 07/02/20 2022  NA 142  K 4.2  CL 105  CO2 27  GLUCOSE 105*  BUN 10  CREATININE 0.94  CALCIUM 8.8*  MG 2.6*   GFR: Estimated Creatinine Clearance: 90.4 mL/min (by C-G formula based on SCr of 0.94 mg/dL). Liver Function Tests: No results for input(s): AST, ALT, ALKPHOS, BILITOT, PROT, ALBUMIN in the last 168 hours. No results for input(s): LIPASE, AMYLASE in the last 168 hours. No results for input(s): AMMONIA in the last 168 hours. Coagulation Profile: No results for input(s): INR, PROTIME in the last 168 hours. Cardiac Enzymes: No results for input(s): CKTOTAL, CKMB, CKMBINDEX, TROPONINI in the last 168 hours. BNP (last 3 results) No results for input(s): PROBNP in the last 8760 hours. HbA1C: No results for input(s): HGBA1C in the last 72 hours. CBG: No results for input(s): GLUCAP in the last 168 hours. Lipid Profile: No results for input(s): CHOL, HDL, LDLCALC, TRIG, CHOLHDL, LDLDIRECT in the last 72 hours. Thyroid Function Tests: No results for input(s): TSH, T4TOTAL, FREET4, T3FREE, THYROIDAB in the last 72 hours. Anemia Panel: No  results for input(s): VITAMINB12, FOLATE, FERRITIN, TIBC, IRON, RETICCTPCT in the last 72 hours. Urine analysis:    Component Value Date/Time   COLORURINE YELLOW 08/07/2018 1752   APPEARANCEUR CLEAR 08/07/2018 1752   LABSPEC 1.033 (H) 08/07/2018 1752   PHURINE 5.0 08/07/2018 1752   GLUCOSEU NEGATIVE 08/07/2018 1752   HGBUR NEGATIVE 08/07/2018 1752   BILIRUBINUR NEGATIVE 08/07/2018 1752  BILIRUBINUR small 05/26/2017 1539   KETONESUR NEGATIVE 08/07/2018 1752   PROTEINUR NEGATIVE 08/07/2018 1752   UROBILINOGEN 1.0 05/26/2017 1539   UROBILINOGEN 1.0 03/22/2014 0805   NITRITE NEGATIVE 08/07/2018 1752   LEUKOCYTESUR NEGATIVE 08/07/2018 1752   Sepsis Labs: '@LABRCNTIP' (procalcitonin:4,lacticidven:4) ) Recent Results (from the past 240 hour(s))  SARS Coronavirus 2 by RT PCR (hospital order, performed in [facility] hospital lab) Nasopharyngeal Nasopharyngeal Swab     Status: None   Collection Time: 07/02/20  8:17 PM   Specimen: Nasopharyngeal Swab  Result Value Ref Range Status   SARS Coronavirus 2 NEGATIVE NEGATIVE Final    Comment: (NOTE) SARS-CoV-2 target nucleic acids are NOT DETECTED.  The SARS-CoV-2 RNA is generally detectable in upper and lower respiratory specimens during the acute phase of infection. The lowest concentration of SARS-CoV-2 viral copies this assay can detect is 250 copies / mL. A negative result does not preclude SARS-CoV-2 infection and should not be used as the sole basis for treatment or other patient management decisions.  A negative result may occur with improper specimen collection / handling, submission of specimen other than nasopharyngeal swab, presence of viral mutation(s) within the areas targeted by this assay, and inadequate number of viral copies (<250 copies / mL). A negative result must be combined with clinical observations, patient history, and epidemiological information.  Fact Sheet for Patients:     StrictlyIdeas.no  Fact Sheet for Healthcare Providers: BankingDealers.co.za  This test is not yet approved or  cleared by the [place] FDA and has been authorized for detection and/or diagnosis of SARS-CoV-2 by FDA under an Emergency Use Authorization (EUA).  This EUA will remain in effect (meaning this test can be used) for the duration of the COVID-19 declaration under Section 564(b)(1) of the Act, 21 U.S.C. section 360bbb-3(b)(1), unless the authorization is terminated or revoked sooner.  Performed at [facility], [place] [address], [place], [place] [address]      Radiological Exams on Admission: DG Chest Portable 1 View  Result Date: 07/02/2020 CLINICAL DATA:  Shortness of breath EXAM: PORTABLE CHEST 1 VIEW COMPARISON:  10/09/2019 FINDINGS: Heart and mediastinal contours are within normal limits. No focal opacities or effusions. No acute bony abnormality. IMPRESSION: No active disease. Electronically Signed   By: Rolm Baptise M.D.   On: 07/02/2020 22:15    EKG: Independently reviewed. Sinus rhythm, short PR.   Assessment/Plan   1. Acute asthma exacerbation   - Presents with 2 days of worsening SOB, wheezing, and chest tightness despite using her breathing treatments at home, was in distress in ED, started on BiPAP, and has been treated with 125 mg IV Solu-Medrol, 2g IV mag, and multiple nebs - No fever or leukocytosis, COVID neg and CXR clear, no evidence for VTE  - Continue systemic steroids, continue breathing treatments, Singulair, trial off BiPAP as she improves    2. OSA  - Currently on BiPAP, once liberated will continue CPAP qHS   3. Prediabetes  - A1c was 6.3% a year ago  - She will be on systemic steroids  - Check CBGs, use a low-intensity SSI if needed   4. Hypertension  - Pharmacy medication-reconciliation pending, will treat as-needed for now     DVT prophylaxis: Lovenox  Code Status: Full  Family  Communication: Discussed with patient  Disposition Plan:  Patient is from: home  Anticipated d/c is to: Home  Anticipated d/c date is: 07/05/20 Patient currently: In acute respiratory distress on BiPAP  Consults called:  None  Admission status: Inpatient     Vianne Bulls, MD [facility]  07/02/2020, 11:46 PM

## 2020-07-02 NOTE — ED Triage Notes (Signed)
Emergency Medicine Provider Triage Evaluation Note  TAMMY WICKLIFFE , a 54 y.o. female  was evaluated in triage.  Pt complains of shortness of breath for 2 days, hx COPD/Asthma, similar to prior episodes. No fever. Has had covid vaccine.  Review of Systems  Positive: Sob, cough Negative: fever  Physical Exam  There were no vitals taken for this visit. Gen:   Moderate respiratory distress HEENT:  Atraumatic  Resp:  Increased wob, b/l expiratory wheezes Cardiac:  Normal rate  Abd:   Nondistended, nontender  MSK:   Moves extremities without difficulty  Neuro:  Speech clear   Medical Decision Making  Medically screening exam initiated at 8:19 PM.  Appropriate orders placed.  LARAMIE GELLES was informed that the remainder of the evaluation will be completed by another provider, this initial triage assessment does not replace that evaluation, and the importance of remaining in the ED until their evaluation is complete.  Clinical Impression   COPD exacerbation, has already received albuterol, Atrovent, Solu-Medrol and magnesium.  Believe she would benefit from additional albuterol, likely BiPAP therapy.  Discussed with RN staff, patient is being prioritized for room. Initial orders placed.    Milagros Loll, MD 07/02/20 2022

## 2020-07-02 NOTE — ED Provider Notes (Signed)
[facility] EMERGENCY DEPARTMENT Provider Note   CSN: [id number] Arrival date & time: 07/02/20  2006     History Chief Complaint  Patient presents with  . Shortness of Breath    Kathleen Rodriguez is a 54 y.o. female.  The history is provided by the patient.  Shortness of Breath Severity:  Severe Onset quality:  Gradual Duration:  2 days Timing:  Constant Progression:  Worsening Chronicity:  Recurrent Context comment:  Patient is not sure what started the symptoms but she started to have cough and shortness of breath Relieved by:  Nothing Worsened by:  Activity and coughing Ineffective treatments:  Inhaler Associated symptoms: chest pain, cough, sputum production and wheezing   Associated symptoms: no abdominal pain, no fever and no headaches   Associated symptoms comment:  Chest tightness with breathing.  Mild productive cough which is clear sputum.  No fever.  Patient reports she has been intubated in the past for severe asthma.  No lower extremity swelling.  No unilateral leg pain. Chest pain:    Quality: tightness     Severity:  Moderate Risk factors comment:  COPD/asthma requiring intubation 1 time in 2016, has received Covid vaccine      Past Medical History:  Diagnosis Date  . Arthritis    "knees, lower back; legs, ankles" (01/27/2016)  . Asthma    followed by Dr. Halford Chessman  . CHF (congestive heart failure) ([place]) 2016   "when I went into a coma"  . Cocaine abuse ([place])   . 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 Dyspnea   . GERD (gastroesophageal reflux disease)   . Hypertension    "doctor took me off RX in 2016" (01/27/2016)  . Influenza B April 7026   Complicated by multi-organ failure  . OSA on CPAP "since " 03/20/2013  . Pneumonia 2016  . Required emergent intubation    asthma exacerbation in 2016  . Tobacco abuse   . Upper airway cough syndrome     Patient Active Problem List   Diagnosis Date Noted  . Toe fracture, left 06/22/2020  .  Prediabetes 10/30/2019  . Hyperglycemia 10/10/2019  . Acute asthma exacerbation 10/09/2019  . 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 Oral thrush 07/04/2019  . Medication management 07/04/2019  . Chest pain 12/25/2018  . Acute-on-chronic respiratory failure ([place]) 12/24/2018  . Skin ulcer ([place]) secondary to bullous impetigo Marilynn Rail 08/29/2018  . Asthma 10/22/2017  . Osteoarthritis of left knee 09/01/2016  . 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 Tobacco abuse   . Primary osteoarthritis of left knee 03/09/2016  . Non compliance with medical treatment 08/19/2015  . Anemia, iron deficiency 08/12/2015  . Essential hypertension 07/25/2015  . COPD exacerbation ([place]) 12/28/2014  . Dysfunctional uterine bleeding 12/28/2014  . Anemia 12/28/2014  . Morbid obesity ([place]) 05/16/2014  . Chronic cough 05/01/2014  . Upper airway cough syndrome 05/01/2014  . GERD (gastroesophageal reflux disease) 04/08/2014  . Asthma exacerbation 04/06/2014  . OSA (obstructive sleep apnea) 03/20/2013  . Uncontrolled persistent asthma 02/05/2013    Past Surgical History:  Procedure Laterality Date  . [place]   as a teenager , cyst was benign  . CESAREAN SECTION  2006  . LACERATION REPAIR Right ~ 1997   "tried to cut myself"  . TOTAL KNEE ARTHROPLASTY Left 09/01/2016   Procedure: LEFT TOTAL KNEE ARTHROPLASTY;  Surgeon: Leandrew Koyanagi, MD;  Location: [facility];  Service: Orthopedics;  Laterality: Left;  . TUBAL LIGATION  2006     OB History   No obstetric history on file.     Family History  Problem Relation Age of Onset  . Hypertension Mother   . HIV/AIDS Father     Social History   Tobacco Use  . Smoking status: Former Smoker    Packs/day: 0.25    Years: 21.00    Pack years: 5.25    Types: Cigarettes    Start date: 38    Quit date: 01/31/2013    Years since quitting: 7.4  . Smokeless tobacco: Never Used  Vaping Use  . Vaping  Use: Never used  Substance Use Topics  . 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 Drug use: No    Types: "Crack" cocaine    Comment: 01/27/2016 "clean from smoking crack since 2007"    Home Medications Prior to Admission medications   Medication Sig Start Date End Date Taking? Authorizing Provider  acetaminophen (TYLENOL) 500 MG tablet Take 500 mg by mouth every 6 (six) hours as needed for mild pain.    [provider]  albuterol (PROVENTIL) (2.5 MG/3ML) 0.083% nebulizer solution Take 3 mLs (2.5 mg total) by nebulization every 6 (six) hours as needed for wheezing or shortness of breath. 10/11/19   [name], MD  albuterol (VENTOLIN HFA) 108 (90 Base) MCG/ACT inhaler Inhale 2 puffs into the lungs every 6 (six) hours as needed for wheezing or shortness of breath. 10/11/19   [name], MD  aspirin 81 MG EC tablet Take 1 tablet (81 mg total) by mouth daily. 02/07/19   Hoy Register, MD  Blood Glucose Monitoring Suppl (ACCU-CHEK GUIDE ME) w/Device KIT 1 each by Does not apply route daily. Dx; Prediabetets; Prednisone use 10/30/19   Hoy Register, MD  Camphor-Eucalyptus-Menthol (VICKS VAPORUB EX) Apply 1 application topically daily as needed (congestion).    [provider]  cloNIDine (CATAPRES) 0.1 MG tablet Take 1 tablet (0.1 mg total) by mouth 2 (two) times daily. Must keep office visit tomorrow for additional refills. 10/29/19   Hoy Register, MD  fluticasone (FLONASE) 50 MCG/ACT nasal spray Place 1 spray into both nostrils daily. Patient taking differently: Place 1 spray into both nostrils daily as needed for allergies.  02/19/18   [name], MD  gabapentin (NEURONTIN) 300 MG capsule Take 2 capsules (600 mg total) by mouth 2 (two) times daily. 11/28/19   Hoy Register, MD  gabapentin (NEURONTIN) 600 MG tablet Take 1 tablet (600 mg total) by mouth 3 (three) times daily. 04/14/20 05/14/20  Delories Heinz, DPM  glucose blood (ACCU-CHEK  GUIDE) test strip Use as instructed daily before breakfast. Dx; Prediabetets; Prednisone use 10/30/19   Hoy Register, MD  HYDROcodone-acetaminophen (NORCO) 5-325 MG tablet Take 1 tablet by mouth every 6 (six) hours as needed for moderate pain. 06/08/20   Hudnall, Azucena Fallen, MD  ipratropium (ATROVENT) 0.02 % nebulizer solution Take 2.5 mLs (0.5 mg total) by nebulization 2 (two) times daily as needed for wheezing or shortness of breath. 10/11/19   [name], MD  loratadine (CLARITIN) 10 MG tablet Take 1 tablet (10 mg total) by mouth daily. 09/09/19   [name], MD  meloxicam (MOBIC) 7.5 MG tablet Take 1 tablet (7.5 mg total) by mouth daily. 04/23/19   Hoy Register, MD  Menthol (HALLS COUGH DROPS MT) Use as directed 1 drop in the mouth or throat 2 (two) times  daily as needed (sore throat and cough).    [provider]  metFORMIN (GLUCOPHAGE) 500 MG tablet Take 1 tablet (500 mg total) by mouth 2 (two) times daily with a meal. Dx; Prediabetes 10/30/19   [name], MD  methocarbamol (ROBAXIN) 500 MG tablet TAKE 1 TABLET (500 MG TOTAL) BY MOUTH EVERY 8 (EIGHT) HOURS AS NEEDED FOR MUSCLE SPASMS. 03/16/20   [name], MD  Misc. Devices MISC Blood pressure monitor  Dx: Hypertension 02/07/19   [name], MD  mometasone-formoterol (DULERA) 200-5 MCG/ACT AERO Inhale 2 puffs into the lungs 2 (two) times daily. 10/11/19   Chesley Mires, MD  montelukast (SINGULAIR) 10 MG tablet Take 1 tablet (10 mg total) by mouth at bedtime. 04/30/19   Martyn Ehrich, NP  oxyCODONE-acetaminophen (PERCOCET/ROXICET) 5-325 MG tablet Take 1 tablet by mouth every 6 (six) hours as needed for severe pain. 06/22/20   Hudnall, Sharyn Lull, MD  pantoprazole (PROTONIX) 40 MG tablet Take 1 tablet (40 mg total) by mouth daily at 12 noon. 02/07/19   [name], MD  predniSONE (DELTASONE) 10 MG tablet Take 3 tabs x's2days,2tabsx's 2 days,1tab x's 2 days 06/24/20   Chesley Mires, MD  Spacer/Aero-Holding Chambers  (AEROCHAMBER MV) inhaler Use as instructed 07/11/18   Chesley Mires, MD  traMADol (ULTRAM) 50 MG tablet Take 1 tablet (50 mg total) by mouth every 6 (six) hours as needed. Patient not taking: Reported on 04/09/2020 09/01/19   Henderly, Britni A, PA-C  urea (CARMOL) 20 % cream Apply 1 application topically daily as needed (For dry feet). 10/15/19   [name], MD    Allergies    Tomato, Latex, and Wool alcohol [lanolin]  Review of Systems   Review of Systems  Constitutional: Negative for fever.  Respiratory: Positive for cough, sputum production, shortness of breath and wheezing.   Cardiovascular: Positive for chest pain.  Gastrointestinal: Negative for abdominal pain.  Neurological: Negative for headaches.  All other systems reviewed and are negative.   Physical Exam Updated Vital Signs BP (!) 153/82 (BP Location: Right Arm)   Pulse 92   Temp 98.2 F (36.8 C) (Oral)   Resp (!) 24   SpO2 100%   Physical Exam Vitals and nursing note reviewed.  Constitutional:      General: She is in acute distress.     Appearance: She is well-developed. She is obese.  HENT:     Head: Normocephalic and atraumatic.     Nose: Nose normal.     Mouth/Throat:     Mouth: Mucous membranes are moist.  Eyes:     Pupils: Pupils are equal, round, and reactive to light.  Cardiovascular:     Rate and Rhythm: Regular rhythm. Tachycardia present.     Pulses: Normal pulses.     Heart sounds: Normal heart sounds. No murmur heard.  No friction rub.  Pulmonary:     Effort: Tachypnea and accessory muscle usage present.     Breath sounds: Wheezing present. No rales.  Abdominal:     General: Bowel sounds are normal. There is no distension.     Palpations: Abdomen is soft.     Tenderness: There is no abdominal tenderness. There is no guarding or rebound.  Musculoskeletal:        General: No tenderness. Normal range of motion.     Right lower leg: No tenderness. No edema.     Left lower leg: No  tenderness. No edema.     Comments: No edema  Skin:  General: Skin is warm and dry.     Capillary Refill: Capillary refill takes less than 2 seconds.     Findings: No rash.  Neurological:     Mental Status: She is alert and oriented to person, place, and time.     Cranial Nerves: No cranial nerve deficit.  Psychiatric:        Mood and Affect: Mood normal.        Behavior: Behavior normal.     ED Results / Procedures / Treatments   Labs (all labs ordered are listed, but only abnormal results are displayed) Labs Reviewed  CBC WITH DIFFERENTIAL/PLATELET - Abnormal; Notable for the following components:      Result Value   RBC 5.32 (*)    HCT 47.4 (*)    RDW 16.0 (*)    Eosinophils Absolute 0.6 (*)    All other components within normal limits  BASIC METABOLIC PANEL - Abnormal; Notable for the following components:   Glucose, Bld 105 (*)    Calcium 8.8 (*)    All other components within normal limits  MAGNESIUM - Abnormal; Notable for the following components:   Magnesium 2.6 (*)    All other components within normal limits  SARS CORONAVIRUS 2 BY RT PCR (HOSPITAL ORDER, [facility])  BRAIN NATRIURETIC PEPTIDE  TROPONIN I (HIGH SENSITIVITY)  TROPONIN I (HIGH SENSITIVITY)    EKG EKG Interpretation  Date/Time:  Thursday July 02 2020 20:14:11 EDT Ventricular Rate:  92 PR Interval:  108 QRS Duration: 72 QT Interval:  350 QTC Calculation: 432 R Axis:   79 Text Interpretation: Sinus rhythm with short PR Otherwise normal ECG Confirmed by Madalyn Rob [phone]) on 07/02/2020 9:00:27 PM   Radiology DG Chest Portable 1 View  Result Date: 07/02/2020 CLINICAL DATA:  Shortness of breath EXAM: PORTABLE CHEST 1 VIEW COMPARISON:  10/09/2019 FINDINGS: Heart and mediastinal contours are within normal limits. No focal opacities or effusions. No acute bony abnormality. IMPRESSION: No active disease. Electronically Signed   By: Rolm Baptise M.D.   On:  07/02/2020 22:15    Procedures Procedures (including critical care time)  Medications Ordered in ED Medications  albuterol (VENTOLIN HFA) 108 (90 Base) MCG/ACT inhaler 4 puff (has no administration in time range)  albuterol (VENTOLIN) (5 MG/ML) 0.5% continuous inhalation solution (has no administration in time range)  albuterol (PROVENTIL,VENTOLIN) solution continuous neb (has no administration in time range)  ipratropium (ATROVENT) nebulizer solution 0.5 mg (has no administration in time range)    ED Course  I have reviewed the triage vital signs and the nursing notes.  Pertinent labs & imaging results that were available during my care of the patient were reviewed by me and considered in my medical decision making (see chart for details).    MDM Rules/Calculators/A&P                          Patient presenting today with symptoms of COPD/asthma exacerbation.  Patient received Solu-Medrol, magnesium, albuterol and Atrovent prior to arrival with ongoing shortness of breath.  Patient was evaluated in triage approximately 1 hour ago and at that time was thought to need BiPAP.  She is remained on a nonrebreather and continues to have diffuse wheezing and tachypnea.  Sats remained intact.  Will start BiPAP and an hour-long neb.  Labs and imaging are pending.  Currently normal mentation.  10:43 PM Pt wheezing and work of breathing has improved on  bipap.  Ekg, CXR and labs are reassuring.  COVID neg.  Will admit for further carel.  MDM Number of Diagnoses or Management Options   Amount and/or Complexity of Data Reviewed Clinical lab tests: ordered and reviewed Tests in the radiology section of CPT: ordered and reviewed Tests in the medicine section of CPT: ordered and reviewed Decide to obtain previous medical records or to obtain history from someone other than the patient: yes Obtain history from someone other than the patient: yes Review and summarize past medical records:  yes Discuss the patient with other providers: yes Independent visualization of images, tracings, or specimens: yes  Risk of Complications, Morbidity, and/or Mortality Presenting problems: high Diagnostic procedures: moderate Management options: moderate  Patient Progress Patient progress: improved  CRITICAL CARE Performed by: Aarib Pulido Total critical care time: 30 minutes Critical care time was exclusive of separately billable procedures and treating other patients. Critical care was necessary to treat or prevent imminent or life-threatening deterioration. Critical care was time spent personally by me on the following activities: development of treatment plan with patient and/or surrogate as well as nursing, discussions with consultants, evaluation of patient's response to treatment, examination of patient, obtaining history from patient or surrogate, ordering and performing treatments and interventions, ordering and review of laboratory studies, ordering and review of radiographic studies, pulse oximetry and re-evaluation of patient's condition.   Final Clinical Impression(s) / ED Diagnoses Final diagnoses:  Severe persistent asthma with exacerbation    Rx / DC Orders ED Discharge Orders    None       Blanchie Dessert, MD 07/02/20 2244

## 2020-07-03 ENCOUNTER — Ambulatory Visit: Payer: Medicaid Other | Admitting: Podiatry

## 2020-07-03 DIAGNOSIS — J4551 Severe persistent asthma with (acute) exacerbation: Secondary | ICD-10-CM | POA: Diagnosis not present

## 2020-07-03 LAB — BASIC METABOLIC PANEL
Anion gap: 13 (ref 5–15)
BUN: 12 mg/dL (ref 6–20)
CO2: 18 mmol/L — ABNORMAL LOW (ref 22–32)
Calcium: 7.6 mg/dL — ABNORMAL LOW (ref 8.9–10.3)
Chloride: 107 mmol/L (ref 98–111)
Creatinine, Ser: 0.96 mg/dL (ref 0.44–1.00)
GFR calc Af Amer: >60 mL/min (ref >60)
GFR calc non Af Amer: >60 mL/min (ref >60)
Glucose, Bld: 268 mg/dL — ABNORMAL HIGH (ref 70–99)
Potassium: 3.6 mmol/L (ref 3.5–5.1)
Sodium: 138 mmol/L (ref 135–145)

## 2020-07-03 LAB — CBC
HCT: 43.2 % (ref 36.0–46.0)
Hemoglobin: 13.2 g/dL (ref 12.0–15.0)
MCH: 27.6 pg (ref 26.0–34.0)
MCHC: 30.6 g/dL (ref 30.0–36.0)
MCV: 90.4 fL (ref 80.0–100.0)
Platelets: 251 10*3/uL (ref 150–400)
RBC: 4.78 MIL/uL (ref 3.87–5.11)
RDW: 16 % — ABNORMAL HIGH (ref 11.5–15.5)
WBC: 9.3 10*3/uL (ref 4.0–10.5)
nRBC: 0 % (ref 0.0–0.2)

## 2020-07-03 LAB — HEMOGLOBIN A1C
Hgb A1c MFr Bld: 5.8 % — ABNORMAL HIGH (ref 4.8–5.6)
Mean Plasma Glucose: 119.76 mg/dL

## 2020-07-03 LAB — CBG MONITORING, ED
Glucose-Capillary: 181 mg/dL — ABNORMAL HIGH (ref 70–99)
Glucose-Capillary: 112 mg/dL — ABNORMAL HIGH (ref 70–99)
Glucose-Capillary: 207 mg/dL — ABNORMAL HIGH (ref 70–99)
Glucose-Capillary: 116 mg/dL — ABNORMAL HIGH (ref 70–99)

## 2020-07-03 MED ORDER — AEROCHAMBER PLUS FLO-VU LARGE MISC
Status: AC
  Administered 2020-07-03: 1
  Filled 2020-07-03: qty 1

## 2020-07-03 MED ORDER — IPRATROPIUM-ALBUTEROL 0.5-2.5 (3) MG/3ML IN SOLN
3.0000 mL | RESPIRATORY_TRACT | Status: DC
  Administered 2020-07-03 – 2020-07-05 (×11): 3 mL via RESPIRATORY_TRACT
  Filled 2020-07-03 (×11): qty 3

## 2020-07-03 MED ORDER — IPRATROPIUM-ALBUTEROL 0.5-2.5 (3) MG/3ML IN SOLN
RESPIRATORY_TRACT | Status: AC
  Administered 2020-07-03: 3 mL via RESPIRATORY_TRACT
  Filled 2020-07-03: qty 3

## 2020-07-03 MED ORDER — METHOCARBAMOL 500 MG PO TABS
500.0000 mg | ORAL_TABLET | Freq: Three times a day (TID) | ORAL | Status: DC | PRN
  Administered 2020-07-03 – 2020-07-04 (×3): 500 mg via ORAL
  Filled 2020-07-03 (×3): qty 1

## 2020-07-03 MED ORDER — ALBUTEROL (5 MG/ML) CONTINUOUS INHALATION SOLN
10.0000 mg/h | INHALATION_SOLUTION | RESPIRATORY_TRACT | Status: DC
  Administered 2020-07-03: 10 mg/h via RESPIRATORY_TRACT

## 2020-07-03 MED ORDER — OXYCODONE-ACETAMINOPHEN 5-325 MG PO TABS
1.0000 | ORAL_TABLET | Freq: Four times a day (QID) | ORAL | Status: DC | PRN
  Administered 2020-07-03 – 2020-07-06 (×10): 1 via ORAL
  Filled 2020-07-03 (×10): qty 1

## 2020-07-03 NOTE — ED Notes (Addendum)
Pt. Given supplies to bath herself. Pt. Bed linen changed and provided with a clean gown.

## 2020-07-03 NOTE — Progress Notes (Signed)
PROGRESS NOTE    Kathleen Rodriguez  UTM:[id number] DOB: Dec 11, 1965 DOA: 07/02/2020 PCP: Hoy Register, MD   Chief Complaint  Patient presents with  . Shortness of Breath    Brief Narrative:  Kathleen Rodriguez is Kathleen Rodriguez 54 y.o. female with medical history significant for severe persistent asthma, hypertension, prediabetes, OSA on CPAP, and remote history of alcohol and substance abuse now presenting to emergency department for evaluation of shortness of breath, wheezing, and chest tightness.  She was initially placed on BIPAP.  She's been admitted for treatment of an asthma exacerbation.  Of note, she recently completed therapy with prednisone for an asthma flare Emonni Depasquale few days ago.     Assessment & Plan:   Principal Problem:   Acute asthma exacerbation Active Problems:   OSA (obstructive sleep apnea)   Essential hypertension   Prediabetes  1. Acute asthma exacerbation   - Presents with 2 days of worsening SOB, wheezing, and chest tightness despite using her breathing treatments at home, was in distress in ED, started on BiPAP, and has been treated with 125 mg IV Solu-Medrol, 2g IV mag, and multiple nebs - No fever or leukocytosis, COVID neg and CXR clear, no evidence for VTE  - continue solumedrol for now - scheduled and prn nebs - singulair - she's off bipap and was looking relatively comfortable when I saw her this AM.  Consider ABG/restart BIPAP as needed for worsening resp status.   2. OSA  - Currently on BiPAP, once liberated will continue CPAP qHS   3. Prediabetes  - A1c 5.8 - She will be on systemic steroids  - Check CBGs, use Greydis Stlouis low-intensity SSI if needed   4. Hypertension  - Pharmacy medication-reconciliation pending, will treat as-needed for now     DVT prophylaxis: lovenox Code Status: full  Family Communication: none at bedside Disposition:   Status is: Inpatient  Remains inpatient appropriate because:Inpatient level of care appropriate due to severity of  illness   Dispo: The patient is from: Home              Anticipated d/c is to: Home              Anticipated d/c date is: > 3 days              Patient currently is not medically stable to d/c.       Consultants:   none  Procedures  none  Antimicrobials:  Anti-infectives (From admission, onward)   None        Subjective: C/o SOB, improved since presentation  Objective: Vitals:   07/03/20 1300 07/03/20 1330 07/03/20 1430 07/03/20 1500  BP: (!) 142/86 (!) 161/88 (!) 151/95 (!) 147/85  Pulse:      Resp: (!) 23 12 (!) 27 (!) 30  Temp:      TempSrc:      SpO2:       No intake or output data in the 24 hours ending 07/03/20 1544 There were no vitals filed for this visit.  Examination:  General exam: Appears calm and comfortable  Respiratory system: mildly increased wob, coarse breath sounds, faint wheezing throughout Cardiovascular system: S1 & S2 heard, RRR.  Gastrointestinal system: Abdomen is nondistended, soft and nontender Central nervous system: Alert and oriented. No focal neurological deficits. Extremities: no LEE Skin: No rashes, lesions or ulcers Psychiatry: Judgement and insight appear normal. Mood & affect appropriate.     Data Reviewed: I have personally reviewed following labs and imaging  studies  CBC: Recent Labs  Lab 07/02/20 2022 07/03/20 0305  WBC 7.7 9.3  NEUTROABS 2.7  --   HGB 14.4 13.2  HCT 47.4* 43.2  MCV 89.1 90.4  PLT 287 251    Basic Metabolic Panel: Recent Labs  Lab 07/02/20 2022 07/03/20 0305  NA 142 138  K 4.2 3.6  CL 105 107  CO2 27 18*  GLUCOSE 105* 268*  BUN 10 12  CREATININE 0.94 0.96  CALCIUM 8.8* 7.6*  MG 2.6*  --     GFR: Estimated Creatinine Clearance: 88.5 mL/min (by C-G formula based on SCr of 0.96 mg/dL).  Liver Function Tests: No results for input(s): AST, ALT, ALKPHOS, BILITOT, PROT, ALBUMIN in the last 168 hours.  CBG: Recent Labs  Lab 07/03/20 0803 07/03/20 1149  GLUCAP 181* 112*      Recent Results (from the past 240 hour(s))  SARS Coronavirus 2 by RT PCR (hospital order, performed in [facility] hospital lab) Nasopharyngeal Nasopharyngeal Swab     Status: None   Collection Time: 07/02/20  8:17 PM   Specimen: Nasopharyngeal Swab  Result Value Ref Range Status   SARS Coronavirus 2 NEGATIVE NEGATIVE Final    Comment: (NOTE) SARS-CoV-2 target nucleic acids are NOT DETECTED.  The SARS-CoV-2 RNA is generally detectable in upper and lower respiratory specimens during the acute phase of infection. The lowest concentration of SARS-CoV-2 viral copies this assay can detect is 250 copies / mL. Marlaysia Lenig negative result does not preclude SARS-CoV-2 infection and should not be used as the sole basis for treatment or other patient management decisions.  Vedant Shehadeh negative result may occur with improper specimen collection / handling, submission of specimen other than nasopharyngeal swab, presence of viral mutation(s) within the areas targeted by this assay, and inadequate number of viral copies (<250 copies / mL). Maevis Mumby negative result must be combined with clinical observations, patient history, and epidemiological information.  Fact Sheet for Patients:   BoilerBrush.com.cy  Fact Sheet for Healthcare Providers: https://pope.com/  This test is not yet approved or  cleared by the [place] FDA and has been authorized for detection and/or diagnosis of SARS-CoV-2 by FDA under an Emergency Use Authorization (EUA).  This EUA will remain in effect (meaning this test can be used) for the duration of the COVID-19 declaration under Section 564(b)(1) of the Act, 21 U.S.C. section 360bbb-3(b)(1), unless the authorization is terminated or revoked sooner.  Performed at [facility], [address], [place], [place] [address]          Radiology Studies: DG Chest Portable 1 View  Result Date: 07/02/2020 CLINICAL DATA:  Shortness of  breath EXAM: PORTABLE CHEST 1 VIEW COMPARISON:  10/09/2019 FINDINGS: Heart and mediastinal contours are within normal limits. No focal opacities or effusions. No acute bony abnormality. IMPRESSION: No active disease. Electronically Signed   By: Charlett Nose M.D.   On: 07/02/2020 22:15        Scheduled Meds: . albuterol  4 puff Inhalation Once  . enoxaparin (LOVENOX) injection  40 mg Subcutaneous Daily  . insulin aspart  0-9 Units Subcutaneous TID WC  . ipratropium-albuterol  3 mL Nebulization Q4H  . methylPREDNISolone (SOLU-MEDROL) injection  40 mg Intravenous Q8H  . mometasone-formoterol  2 puff Inhalation BID  . montelukast  10 mg Oral QHS  . sodium chloride flush  3 mL Intravenous Q12H  . sodium chloride flush  3 mL Intravenous Q12H   Continuous Infusions: . sodium chloride    .  albuterol 10 mg/hr (07/03/20 0105)     LOS: 1 day    Time spent: over 30 min    Lacretia Nicks, MD [facility]   To contact the attending provider between 7A-7P or the covering provider during after hours 7P-7A, please log into the web site [email or web address] and access using universal [place] password for that web site. If you do not have the password, please call the hospital operator.  07/03/2020, 3:44 PM

## 2020-07-03 NOTE — ED Notes (Signed)
Pt. Assisted to standing position. Pt. Complained off "abdominal cramping and spasms" and needed "to stretch it out". Once pt. Was assisted back in bed she was noticeably SOB and complained of feeling dizzy. RN notified.

## 2020-07-04 DIAGNOSIS — J4551 Severe persistent asthma with (acute) exacerbation: Secondary | ICD-10-CM | POA: Diagnosis not present

## 2020-07-04 DIAGNOSIS — I10 Essential (primary) hypertension: Secondary | ICD-10-CM | POA: Diagnosis not present

## 2020-07-04 DIAGNOSIS — G4733 Obstructive sleep apnea (adult) (pediatric): Secondary | ICD-10-CM | POA: Diagnosis not present

## 2020-07-04 DIAGNOSIS — R7303 Prediabetes: Secondary | ICD-10-CM | POA: Diagnosis not present

## 2020-07-04 LAB — GLUCOSE, CAPILLARY
Glucose-Capillary: 119 mg/dL — ABNORMAL HIGH (ref 70–99)
Glucose-Capillary: 128 mg/dL — ABNORMAL HIGH (ref 70–99)
Glucose-Capillary: 124 mg/dL — ABNORMAL HIGH (ref 70–99)
Glucose-Capillary: 114 mg/dL — ABNORMAL HIGH (ref 70–99)
Glucose-Capillary: 164 mg/dL — ABNORMAL HIGH (ref 70–99)

## 2020-07-04 LAB — BASIC METABOLIC PANEL
Anion gap: 11 (ref 5–15)
BUN: 14 mg/dL (ref 6–20)
CO2: 26 mmol/L (ref 22–32)
Calcium: 9.2 mg/dL (ref 8.9–10.3)
Chloride: 103 mmol/L (ref 98–111)
Creatinine, Ser: 0.89 mg/dL (ref 0.44–1.00)
GFR calc Af Amer: >60 mL/min (ref >60)
GFR calc non Af Amer: >60 mL/min (ref >60)
Glucose, Bld: 112 mg/dL — ABNORMAL HIGH (ref 70–99)
Potassium: 5.1 mmol/L (ref 3.5–5.1)
Sodium: 140 mmol/L (ref 135–145)

## 2020-07-04 LAB — CBC
HCT: 44.9 % (ref 36.0–46.0)
Hemoglobin: 13.9 g/dL (ref 12.0–15.0)
MCH: 27.1 pg (ref 26.0–34.0)
MCHC: 31 g/dL (ref 30.0–36.0)
MCV: 87.5 fL (ref 80.0–100.0)
Platelets: 300 10*3/uL (ref 150–400)
RBC: 5.13 MIL/uL — ABNORMAL HIGH (ref 3.87–5.11)
RDW: 16.2 % — ABNORMAL HIGH (ref 11.5–15.5)
WBC: 16.3 10*3/uL — ABNORMAL HIGH (ref 4.0–10.5)
nRBC: 0 % (ref 0.0–0.2)

## 2020-07-04 MED ORDER — CLONIDINE HCL 0.1 MG PO TABS
0.1000 mg | ORAL_TABLET | Freq: Two times a day (BID) | ORAL | Status: DC
  Administered 2020-07-04 – 2020-07-06 (×5): 0.1 mg via ORAL
  Filled 2020-07-04 (×5): qty 1

## 2020-07-04 MED ORDER — GABAPENTIN 300 MG PO CAPS
600.0000 mg | ORAL_CAPSULE | Freq: Two times a day (BID) | ORAL | Status: DC
  Administered 2020-07-04 – 2020-07-06 (×3): 600 mg via ORAL
  Filled 2020-07-04 (×5): qty 2

## 2020-07-04 MED ORDER — MELOXICAM 7.5 MG PO TABS
7.5000 mg | ORAL_TABLET | Freq: Every day | ORAL | Status: DC
  Administered 2020-07-04 – 2020-07-06 (×3): 7.5 mg via ORAL
  Filled 2020-07-04 (×3): qty 1

## 2020-07-04 MED ORDER — ALBUTEROL SULFATE (2.5 MG/3ML) 0.083% IN NEBU: 2.5000 mg | INHALATION_SOLUTION | RESPIRATORY_TRACT | Status: DC | PRN

## 2020-07-04 MED ORDER — DEXTROMETHORPHAN POLISTIREX ER 30 MG/5ML PO SUER
15.0000 mg | Freq: Two times a day (BID) | ORAL | Status: DC | PRN
  Administered 2020-07-04 – 2020-07-06 (×4): 15 mg via ORAL
  Filled 2020-07-04 (×5): qty 5

## 2020-07-04 MED ORDER — FLUTICASONE PROPIONATE 50 MCG/ACT NA SUSP
1.0000 | Freq: Every day | NASAL | Status: DC
  Administered 2020-07-04 – 2020-07-06 (×3): 1 via NASAL
  Filled 2020-07-04 (×2): qty 16

## 2020-07-04 MED ORDER — PANTOPRAZOLE SODIUM 40 MG PO TBEC
40.0000 mg | DELAYED_RELEASE_TABLET | Freq: Every day | ORAL | Status: DC
  Administered 2020-07-04 – 2020-07-05 (×2): 40 mg via ORAL
  Filled 2020-07-04 (×2): qty 1

## 2020-07-04 MED ORDER — ASPIRIN EC 81 MG PO TBEC
81.0000 mg | DELAYED_RELEASE_TABLET | Freq: Every day | ORAL | Status: DC
  Administered 2020-07-04 – 2020-07-06 (×3): 81 mg via ORAL
  Filled 2020-07-04 (×3): qty 1

## 2020-07-04 NOTE — Progress Notes (Signed)
PROGRESS NOTE    Kathleen Rodriguez  LYY:[id number] DOB: October 10, 1966 DOA: 07/02/2020 PCP: Hoy Register, MD    Brief Narrative:  54 y.o.femalewith medical history significant forsevere persistent asthma, hypertension, prediabetes, OSA on CPAP, and remote history of alcohol and substance abuse now presenting to emergency department for evaluation of shortness of breath, wheezing, and chest tightness.  She was initially placed on BIPAP.  She's been admitted for treatment of an asthma exacerbation.  Of note, she recently completed therapy with prednisone for an asthma flare a few days ago.  Assessment & Plan:   Principal Problem:   Acute asthma exacerbation Active Problems:   OSA (obstructive sleep apnea)   Essential hypertension   Prediabetes  1.Acute asthma exacerbation -Presents with 2 days of worsening SOB, wheezing, and chest tightness despite using her breathing treatments at home, was in distress in ED, started on BiPAP, and has been treated with 125 mg IV Solu-Medrol, 2gIV mag, and multiple nebs - No fever or leukocytosis, COVIDneg and CXRclear, no evidence for VTE -Lungs remain tight with decreased BS and end-expiratory wheezing - continue solumedrol for now - scheduled and prn nebs - singulair - now off bipap.   2.OSA -will continue with nocturnal CPAP at night  3.Prediabetes -A1c 5.8 -She will be on systemic steroids -Check CBGs, use a low-intensity SSI if needed -glycemic trends are stable  4.Hypertension -cont home meds as tolerated -Stable   DVT prophylaxis: Lovenox subq Code Status: Full Family Communication: Pt in room, family not at bedside  Status is: Inpatient  Remains inpatient appropriate because:Unsafe d/c plan and IV treatments appropriate due to intensity of illness or inability to take PO   Dispo: The patient is from: Home              Anticipated d/c is to: Home              Anticipated d/c date is: 2 days               Patient currently is not medically stable to d/c.       Consultants:     Procedures:     Antimicrobials: Anti-infectives (From admission, onward)   None       Subjective: Feeling better, but still sob and wheezing  Objective: Vitals:   07/04/20 0245 07/04/20 0322 07/04/20 0838 07/04/20 1143  BP:  (!) 143/82 (!) 142/85 129/78  Pulse:   94 99  Resp: (!) 21 18 20 18   Temp:  98.7 F (37.1 C) 98.6 F (37 C) 97.7 F (36.5 C)  TempSrc:  Oral Oral Oral  SpO2:   98% 100%  Weight:  115.2 kg      Intake/Output Summary (Last 24 hours) at 07/04/2020 1742 Last data filed at 07/04/2020 1359 Gross per 24 hour  Intake 480 ml  Output 700 ml  Net -220 ml   Filed Weights   07/04/20 0322  Weight: 115.2 kg    Examination:  General exam: Appears calm and comfortable  Respiratory system: decreased BS throughout, end-expiratory wheezing Cardiovascular system: S1 & S2 heard, Regular Gastrointestinal system: Abdomen is nondistended, soft and nontender. No organomegaly or masses felt. Normal bowel sounds heard. Central nervous system: Alert and oriented. No focal neurological deficits. Extremities: Symmetric 5 x 5 power. Skin: No rashes, lesions  Psychiatry: Judgement and insight appear normal. Mood & affect appropriate.   Data Reviewed: I have personally reviewed following labs and imaging studies  CBC: Recent Labs  Lab 07/02/20 2022 07/03/20  0305 07/04/20 0429  WBC 7.7 9.3 16.3*  NEUTROABS 2.7  --   --   HGB 14.4 13.2 13.9  HCT 47.4* 43.2 44.9  MCV 89.1 90.4 87.5  PLT 287 251 300   Basic Metabolic Panel: Recent Labs  Lab 07/02/20 2022 07/03/20 0305 07/04/20 0429  NA 142 138 140  K 4.2 3.6 5.1  CL 105 107 103  CO2 27 18* 26  GLUCOSE 105* 268* 112*  BUN 10 12 14   CREATININE 0.94 0.96 0.89  CALCIUM 8.8* 7.6* 9.2  MG 2.6*  --   --    GFR: Estimated Creatinine Clearance: 93.2 mL/min (by C-G formula based on SCr of 0.89 mg/dL). Liver Function  Tests: No results for input(s): AST, ALT, ALKPHOS, BILITOT, PROT, ALBUMIN in the last 168 hours. No results for input(s): LIPASE, AMYLASE in the last 168 hours. No results for input(s): AMMONIA in the last 168 hours. Coagulation Profile: No results for input(s): INR, PROTIME in the last 168 hours. Cardiac Enzymes: No results for input(s): CKTOTAL, CKMB, CKMBINDEX, TROPONINI in the last 168 hours. BNP (last 3 results) No results for input(s): PROBNP in the last 8760 hours. HbA1C: Recent Labs    07/03/20 0305  HGBA1C 5.8*   CBG: Recent Labs  Lab 07/03/20 2320 07/04/20 0325 07/04/20 0835 07/04/20 1141 07/04/20 1719  GLUCAP 116* 119* 128* 124* 114*   Lipid Profile: No results for input(s): CHOL, HDL, LDLCALC, TRIG, CHOLHDL, LDLDIRECT in the last 72 hours. Thyroid Function Tests: No results for input(s): TSH, T4TOTAL, FREET4, T3FREE, THYROIDAB in the last 72 hours. Anemia Panel: No results for input(s): VITAMINB12, FOLATE, FERRITIN, TIBC, IRON, RETICCTPCT in the last 72 hours. Sepsis Labs: No results for input(s): PROCALCITON, LATICACIDVEN in the last 168 hours.  Recent Results (from the past 240 hour(s))  SARS Coronavirus 2 by RT PCR (hospital order, performed in [facility] hospital lab) Nasopharyngeal Nasopharyngeal Swab     Status: None   Collection Time: 07/02/20  8:17 PM   Specimen: Nasopharyngeal Swab  Result Value Ref Range Status   SARS Coronavirus 2 NEGATIVE NEGATIVE Final    Comment: (NOTE) SARS-CoV-2 target nucleic acids are NOT DETECTED.  The SARS-CoV-2 RNA is generally detectable in upper and lower respiratory specimens during the acute phase of infection. The lowest concentration of SARS-CoV-2 viral copies this assay can detect is 250 copies / mL. A negative result does not preclude SARS-CoV-2 infection and should not be used as the sole basis for treatment or other patient management decisions.  A negative result may occur with improper specimen collection  / handling, submission of specimen other than nasopharyngeal swab, presence of viral mutation(s) within the areas targeted by this assay, and inadequate number of viral copies (<250 copies / mL). A negative result must be combined with clinical observations, patient history, and epidemiological information.  Fact Sheet for Patients:   09/01/20  Fact Sheet for Healthcare Providers: BoilerBrush.com.cy  This test is not yet approved or  cleared by the https://pope.com/ FDA and has been authorized for detection and/or diagnosis of SARS-CoV-2 by FDA under an Emergency Use Authorization (EUA).  This EUA will remain in effect (meaning this test can be used) for the duration of the COVID-19 declaration under Section 564(b)(1) of the Act, 21 U.S.C. section 360bbb-3(b)(1), unless the authorization is terminated or revoked sooner.  Performed at [facility], [address], [place], [place]      Radiology Studies: DG Chest Portable 1 View  Result  Date: 07/02/2020 CLINICAL DATA:  Shortness of breath EXAM: PORTABLE CHEST 1 VIEW COMPARISON:  10/09/2019 FINDINGS: Heart and mediastinal contours are within normal limits. No focal opacities or effusions. No acute bony abnormality. IMPRESSION: No active disease. Electronically Signed   By: Charlett Nose M.D.   On: 07/02/2020 22:15    Scheduled Meds: . albuterol  4 puff Inhalation Once  . aspirin EC  81 mg Oral Daily  . cloNIDine  0.1 mg Oral BID  . enoxaparin (LOVENOX) injection  40 mg Subcutaneous Daily  . fluticasone  1 spray Each Nare Daily  . gabapentin  600 mg Oral BID  . insulin aspart  0-9 Units Subcutaneous TID WC  . ipratropium-albuterol  3 mL Nebulization Q4H  . meloxicam  7.5 mg Oral Daily  . methylPREDNISolone (SOLU-MEDROL) injection  40 mg Intravenous Q8H  . mometasone-formoterol  2 puff Inhalation BID  . montelukast  10 mg Oral QHS  . pantoprazole  40 mg Oral  Q1200  . sodium chloride flush  3 mL Intravenous Q12H  . sodium chloride flush  3 mL Intravenous Q12H   Continuous Infusions: . sodium chloride       LOS: 2 days   Rickey Barbara, MD [facility] Pager On Amion  If 7PM-7AM, please contact night-coverage 07/04/2020, 5:42 PM

## 2020-07-05 DIAGNOSIS — J4551 Severe persistent asthma with (acute) exacerbation: Secondary | ICD-10-CM | POA: Diagnosis not present

## 2020-07-05 DIAGNOSIS — G4733 Obstructive sleep apnea (adult) (pediatric): Secondary | ICD-10-CM | POA: Diagnosis not present

## 2020-07-05 DIAGNOSIS — I10 Essential (primary) hypertension: Secondary | ICD-10-CM | POA: Diagnosis not present

## 2020-07-05 DIAGNOSIS — R7303 Prediabetes: Secondary | ICD-10-CM | POA: Diagnosis not present

## 2020-07-05 LAB — CBC
HCT: 45.4 % (ref 36.0–46.0)
Hemoglobin: 14.3 g/dL (ref 12.0–15.0)
MCH: 27.7 pg (ref 26.0–34.0)
MCHC: 31.5 g/dL (ref 30.0–36.0)
MCV: 87.8 fL (ref 80.0–100.0)
Platelets: 323 10*3/uL (ref 150–400)
RBC: 5.17 MIL/uL — ABNORMAL HIGH (ref 3.87–5.11)
RDW: 16.3 % — ABNORMAL HIGH (ref 11.5–15.5)
WBC: 13.6 10*3/uL — ABNORMAL HIGH (ref 4.0–10.5)
nRBC: 0 % (ref 0.0–0.2)

## 2020-07-05 LAB — BASIC METABOLIC PANEL
Anion gap: 7 (ref 5–15)
BUN: 16 mg/dL (ref 6–20)
CO2: 29 mmol/L (ref 22–32)
Calcium: 9.2 mg/dL (ref 8.9–10.3)
Chloride: 104 mmol/L (ref 98–111)
Creatinine, Ser: 0.91 mg/dL (ref 0.44–1.00)
GFR calc Af Amer: >60 mL/min (ref >60)
GFR calc non Af Amer: >60 mL/min (ref >60)
Glucose, Bld: 117 mg/dL — ABNORMAL HIGH (ref 70–99)
Potassium: 4.7 mmol/L (ref 3.5–5.1)
Sodium: 140 mmol/L (ref 135–145)

## 2020-07-05 LAB — GLUCOSE, CAPILLARY
Glucose-Capillary: 168 mg/dL — ABNORMAL HIGH (ref 70–99)
Glucose-Capillary: 120 mg/dL — ABNORMAL HIGH (ref 70–99)
Glucose-Capillary: 157 mg/dL — ABNORMAL HIGH (ref 70–99)
Glucose-Capillary: 167 mg/dL — ABNORMAL HIGH (ref 70–99)

## 2020-07-05 MED ORDER — GUAIFENESIN ER 600 MG PO TB12
600.0000 mg | ORAL_TABLET | Freq: Two times a day (BID) | ORAL | Status: DC
  Administered 2020-07-05 – 2020-07-06 (×3): 600 mg via ORAL
  Filled 2020-07-05 (×3): qty 1

## 2020-07-05 MED ORDER — METHYLPREDNISOLONE SODIUM SUCC 40 MG IJ SOLR
40.0000 mg | Freq: Two times a day (BID) | INTRAMUSCULAR | Status: DC
  Administered 2020-07-05 – 2020-07-06 (×2): 40 mg via INTRAVENOUS
  Filled 2020-07-05 (×2): qty 1

## 2020-07-05 MED ORDER — NEOMYCIN-POLYMYXIN-HC 3.5-10000-1 OT SOLN
3.0000 [drp] | Freq: Four times a day (QID) | OTIC | Status: DC
  Administered 2020-07-05 – 2020-07-06 (×2): 3 [drp] via OTIC
  Filled 2020-07-05: qty 10

## 2020-07-05 MED ORDER — IPRATROPIUM-ALBUTEROL 0.5-2.5 (3) MG/3ML IN SOLN
3.0000 mL | Freq: Four times a day (QID) | RESPIRATORY_TRACT | Status: DC
  Administered 2020-07-05 – 2020-07-06 (×3): 3 mL via RESPIRATORY_TRACT
  Filled 2020-07-05 (×3): qty 3

## 2020-07-05 MED ORDER — FLUCONAZOLE 150 MG PO TABS
150.0000 mg | ORAL_TABLET | Freq: Once | ORAL | Status: AC
  Administered 2020-07-05: 150 mg via ORAL
  Filled 2020-07-05: qty 1

## 2020-07-05 NOTE — Progress Notes (Signed)
PROGRESS NOTE    Kathleen Rodriguez  ZOX:[id number] DOB: Nov 12, 1965 DOA: 07/02/2020 PCP: Hoy Register, MD    Brief Narrative:  54 y.o.femalewith medical history significant forsevere persistent asthma, hypertension, prediabetes, OSA on CPAP, and remote history of alcohol and substance abuse now presenting to emergency department for evaluation of shortness of breath, wheezing, and chest tightness.  She was initially placed on BIPAP.  She's been admitted for treatment of an asthma exacerbation.  Of note, she recently completed therapy with prednisone for an asthma flare a few days ago.  Assessment & Plan:   Principal Problem:   Acute asthma exacerbation Active Problems:   OSA (obstructive sleep apnea)   Essential hypertension   Prediabetes  1.Acute asthma exacerbation -Presents with 2 days of worsening SOB, wheezing, and chest tightness despite using her breathing treatments at home, was in distress in ED, started on BiPAP, and has been treated with 125 mg IV Solu-Medrol, 2gIV mag, and multiple nebs - No fever or leukocytosis, COVIDneg and CXRclear, no evidence for VTE -Lungs now with improved air movement, still with end-expiratory wheeze - scheduled and prn nebs -Will wean solumedrol to Q12h dosing - singulair - remains off bipap.   2.OSA -will continue with nocturnal CPAP at night  3.Prediabetes -A1c 5.8 -She will be on systemic steroids -Check CBGs, use a low-intensity SSI if needed -glycemic trends remain stable  4.Hypertension -cont home meds as tolerated -Remains stable at this time   DVT prophylaxis: Lovenox subq Code Status: Full Family Communication: Pt in room, family not at bedside  Status is: Inpatient  Remains inpatient appropriate because:Unsafe d/c plan and IV treatments appropriate due to intensity of illness or inability to take PO   Dispo: The patient is from: Home              Anticipated d/c is to: Home               Anticipated d/c date is: 2 days              Patient currently is not medically stable to d/c.  Consultants:     Procedures:     Antimicrobials: Anti-infectives (From admission, onward)   Start     Dose/Rate Route Frequency Ordered Stop   07/05/20 1400  fluconazole (DIFLUCAN) tablet 150 mg        150 mg Oral  Once 07/05/20 1311 07/05/20 1629      Subjective: Reports breathing better, hoping to go home tomorrow  Objective: Vitals:   07/05/20 0727 07/05/20 0900 07/05/20 1116 07/05/20 1239  BP:  (!) 157/138  127/65  Pulse:  85  91  Resp:    18  Temp:    97.6 F (36.4 C)  TempSrc:    Oral  SpO2: 100% 100% 97% 94%  Weight:        Intake/Output Summary (Last 24 hours) at 07/05/2020 1711 Last data filed at 07/05/2020 0854 Gross per 24 hour  Intake 240 ml  Output --  Net 240 ml   Filed Weights   07/04/20 0322 07/05/20 0409  Weight: 115.2 kg 114.5 kg    Examination: General exam: Awake, laying in bed, in nad Respiratory system: Decreased BS throughout with end-expiratory wheezing, improved since yesterday Cardiovascular system: regular rate, s1, s2 Gastrointestinal system: Soft, nondistended, positive BS Central nervous system: CN2-12 grossly intact, strength intact Extremities: Perfused, no clubbing Skin: Normal skin turgor, no notable skin lesions seen Psychiatry: Mood normal // no visual hallucinations   Data  Reviewed: I have personally reviewed following labs and imaging studies  CBC: Recent Labs  Lab 07/02/20 2022 07/03/20 0305 07/04/20 0429 07/05/20 0444  WBC 7.7 9.3 16.3* 13.6*  NEUTROABS 2.7  --   --   --   HGB 14.4 13.2 13.9 14.3  HCT 47.4* 43.2 44.9 45.4  MCV 89.1 90.4 87.5 87.8  PLT 287 251 300 323   Basic Metabolic Panel: Recent Labs  Lab 07/02/20 2022 07/03/20 0305 07/04/20 0429 07/05/20 0444  NA 142 138 140 140  K 4.2 3.6 5.1 4.7  CL 105 107 103 104  CO2 27 18* 26 29  GLUCOSE 105* 268* 112* 117*  BUN 10 12 14 16   CREATININE 0.94  0.96 0.89 0.91  CALCIUM 8.8* 7.6* 9.2 9.2  MG 2.6*  --   --   --    GFR: Estimated Creatinine Clearance: 90.8 mL/min (by C-G formula based on SCr of 0.91 mg/dL). Liver Function Tests: No results for input(s): AST, ALT, ALKPHOS, BILITOT, PROT, ALBUMIN in the last 168 hours. No results for input(s): LIPASE, AMYLASE in the last 168 hours. No results for input(s): AMMONIA in the last 168 hours. Coagulation Profile: No results for input(s): INR, PROTIME in the last 168 hours. Cardiac Enzymes: No results for input(s): CKTOTAL, CKMB, CKMBINDEX, TROPONINI in the last 168 hours. BNP (last 3 results) No results for input(s): PROBNP in the last 8760 hours. HbA1C: Recent Labs    07/03/20 0305  HGBA1C 5.8*   CBG: Recent Labs  Lab 07/04/20 1719 07/04/20 2023 07/05/20 0807 07/05/20 1118 07/05/20 1659  GLUCAP 114* 164* 168* 120* 157*   Lipid Profile: No results for input(s): CHOL, HDL, LDLCALC, TRIG, CHOLHDL, LDLDIRECT in the last 72 hours. Thyroid Function Tests: No results for input(s): TSH, T4TOTAL, FREET4, T3FREE, THYROIDAB in the last 72 hours. Anemia Panel: No results for input(s): VITAMINB12, FOLATE, FERRITIN, TIBC, IRON, RETICCTPCT in the last 72 hours. Sepsis Labs: No results for input(s): PROCALCITON, LATICACIDVEN in the last 168 hours.  Recent Results (from the past 240 hour(s))  SARS Coronavirus 2 by RT PCR (hospital order, performed in [facility] hospital lab) Nasopharyngeal Nasopharyngeal Swab     Status: None   Collection Time: 07/02/20  8:17 PM   Specimen: Nasopharyngeal Swab  Result Value Ref Range Status   SARS Coronavirus 2 NEGATIVE NEGATIVE Final    Comment: (NOTE) SARS-CoV-2 target nucleic acids are NOT DETECTED.  The SARS-CoV-2 RNA is generally detectable in upper and lower respiratory specimens during the acute phase of infection. The lowest concentration of SARS-CoV-2 viral copies this assay can detect is 250 copies / mL. A negative result does not  preclude SARS-CoV-2 infection and should not be used as the sole basis for treatment or other patient management decisions.  A negative result may occur with improper specimen collection / handling, submission of specimen other than nasopharyngeal swab, presence of viral mutation(s) within the areas targeted by this assay, and inadequate number of viral copies (<250 copies / mL). A negative result must be combined with clinical observations, patient history, and epidemiological information.  Fact Sheet for Patients:   09/01/20  Fact Sheet for Healthcare Providers: BoilerBrush.com.cy  This test is not yet approved or  cleared by the https://pope.com/ FDA and has been authorized for detection and/or diagnosis of SARS-CoV-2 by FDA under an Emergency Use Authorization (EUA).  This EUA will remain in effect (meaning this test can be used) for the duration of the COVID-19 declaration under Section 564(b)(1) of  the Act, 21 U.S.C. section 360bbb-3(b)(1), unless the authorization is terminated or revoked sooner.  Performed at [facility], [address], [place], [place] [address]      Radiology Studies: No results found.  Scheduled Meds: . albuterol  4 puff Inhalation Once  . aspirin EC  81 mg Oral Daily  . cloNIDine  0.1 mg Oral BID  . enoxaparin (LOVENOX) injection  40 mg Subcutaneous Daily  . fluticasone  1 spray Each Nare Daily  . gabapentin  600 mg Oral BID  . guaiFENesin  600 mg Oral BID  . insulin aspart  0-9 Units Subcutaneous TID WC  . ipratropium-albuterol  3 mL Nebulization Q6H  . meloxicam  7.5 mg Oral Daily  . methylPREDNISolone (SOLU-MEDROL) injection  40 mg Intravenous Q12H  . mometasone-formoterol  2 puff Inhalation BID  . montelukast  10 mg Oral QHS  . neomycin-polymyxin-hydrocortisone  3 drop Right EAR Q6H  . pantoprazole  40 mg Oral Q1200  . sodium chloride flush  3 mL Intravenous Q12H  . sodium  chloride flush  3 mL Intravenous Q12H   Continuous Infusions: . sodium chloride       LOS: 3 days   Rickey Barbara, MD [facility] Pager On Amion  If 7PM-7AM, please contact night-coverage 07/05/2020, 5:11 PM

## 2020-07-06 ENCOUNTER — Encounter (HOSPITAL_COMMUNITY): Payer: Self-pay | Admitting: Family Medicine

## 2020-07-06 DIAGNOSIS — R7303 Prediabetes: Secondary | ICD-10-CM | POA: Diagnosis not present

## 2020-07-06 DIAGNOSIS — J4551 Severe persistent asthma with (acute) exacerbation: Secondary | ICD-10-CM | POA: Diagnosis not present

## 2020-07-06 DIAGNOSIS — I10 Essential (primary) hypertension: Secondary | ICD-10-CM | POA: Diagnosis not present

## 2020-07-06 DIAGNOSIS — G4733 Obstructive sleep apnea (adult) (pediatric): Secondary | ICD-10-CM | POA: Diagnosis not present

## 2020-07-06 LAB — CBC
HCT: 45.5 % (ref 36.0–46.0)
Hemoglobin: 14.5 g/dL (ref 12.0–15.0)
MCH: 27.6 pg (ref 26.0–34.0)
MCHC: 31.9 g/dL (ref 30.0–36.0)
MCV: 86.5 fL (ref 80.0–100.0)
Platelets: 316 10*3/uL (ref 150–400)
RBC: 5.26 MIL/uL — ABNORMAL HIGH (ref 3.87–5.11)
RDW: 15.9 % — ABNORMAL HIGH (ref 11.5–15.5)
WBC: 11.6 10*3/uL — ABNORMAL HIGH (ref 4.0–10.5)
nRBC: 0 % (ref 0.0–0.2)

## 2020-07-06 LAB — BASIC METABOLIC PANEL
Anion gap: 11 (ref 5–15)
BUN: 16 mg/dL (ref 6–20)
CO2: 26 mmol/L (ref 22–32)
Calcium: 9.1 mg/dL (ref 8.9–10.3)
Chloride: 101 mmol/L (ref 98–111)
Creatinine, Ser: 0.97 mg/dL (ref 0.44–1.00)
GFR calc Af Amer: >60 mL/min (ref >60)
GFR calc non Af Amer: >60 mL/min (ref >60)
Glucose, Bld: 147 mg/dL — ABNORMAL HIGH (ref 70–99)
Potassium: 4.7 mmol/L (ref 3.5–5.1)
Sodium: 138 mmol/L (ref 135–145)

## 2020-07-06 LAB — GLUCOSE, CAPILLARY: Glucose-Capillary: 136 mg/dL — ABNORMAL HIGH (ref 70–99)

## 2020-07-06 MED ORDER — PREDNISONE 10 MG PO TABS: ORAL_TABLET | ORAL | 0 refills | Status: DC

## 2020-07-06 MED ORDER — CLONIDINE HCL 0.1 MG PO TABS
0.1000 mg | ORAL_TABLET | Freq: Two times a day (BID) | ORAL | 0 refills | Status: DC
Start: 2020-07-06 — End: 2020-10-29

## 2020-07-06 MED ORDER — PREDNISONE 10 MG PO TABS: ORAL_TABLET | ORAL | 0 refills | Status: AC

## 2020-07-06 MED ORDER — PANTOPRAZOLE SODIUM 40 MG PO TBEC
40.0000 mg | DELAYED_RELEASE_TABLET | Freq: Every day | ORAL | 0 refills | Status: DC
Start: 2020-07-06 — End: 2021-03-31

## 2020-07-06 MED FILL — cloNIDine HCL 0.1 MG TABS: 0.1 | 30 days supply | Qty: 60 | Fill #0

## 2020-07-06 MED FILL — predniSONE 10 MG TABS: 10 | 8 days supply | Qty: 15 | Fill #0

## 2020-07-06 MED FILL — PANTOPRAZOLE SOD DR 40 MG T: 40 | 30 days supply | Qty: 30 | Fill #0

## 2020-07-06 NOTE — Progress Notes (Signed)
SATURATION QUALIFICATIONS: (This note is used to comply with regulatory documentation for home oxygen)  Patient Saturations on Room Air at Rest = 100%  Patient Saturations on Room Air while Ambulating = 97%  Patient Saturations on 0 Liters of oxygen while Ambulating = 97%  Please briefly explain why patient needs home oxygen: 

## 2020-07-06 NOTE — TOC Transition Note (Signed)
Transition of Care [facility]) - CM/SW Discharge Note   Patient Details  Name: Kathleen Rodriguez MRN: [id number] Date of Birth: 08-17-1966  [facility] of [facility]) CM/SW Contact:  Terrial Rhodes, LCSWA Phone Number: 07/06/2020, 11:26 AM   Clinical Narrative:      Patient will DC to: Home [address] [place] [address]  Anticipated DC date: 07/06/2020   Transport by: [employer]  ?  Per MD patient ready for DC to home . RN, and patient notified of DC. [facility] transportation requested for patient.  CSW signing off.       Patient Goals and CMS Choice        Discharge Placement                       Discharge Plan and Services                                     Social Determinants of Health (SDOH) Interventions     Readmission Risk Interventions No flowsheet data found.

## 2020-07-06 NOTE — Discharge Summary (Signed)
Physician Discharge Summary  Kathleen Rodriguez DHR:[id number] DOB: [birth year] DOA: 07/02/2020  PCP: Hoy Register, MD  Admit date: 07/02/2020 Discharge date: 07/06/2020  Admitted From: Home Disposition:  Home  Recommendations for Outpatient Follow-up:  1. Follow up with PCP in 1-2 weeks  Discharge Condition:improved CODE STATUS:Full Diet recommendation: Regular   Brief/Interim Summary: 54 y.o.femalewith medical history significant forsevere persistent asthma, hypertension, prediabetes, OSA on CPAP, and remote history of alcohol and substance abuse now presenting to emergency department for evaluation of shortness of breath, wheezing, and chest tightness.She was initially placed on BIPAP. She's been admitted for treatment of an asthma exacerbation. Of note, she recently completed therapy with prednisone for an asthma flare a few days ago.  Discharge Diagnoses:  Principal Problem:   Acute asthma exacerbation Active Problems:   OSA (obstructive sleep apnea)   Essential hypertension   Prediabetes  1.Acute asthma exacerbation -Presents with 2 days of worsening SOB, wheezing, and chest tightness despite using her breathing treatments at home, was in distress in ED, started on BiPAP, and has been treated with 125 mg IV Solu-Medrol, 2gIV mag, and multiple nebs - No fever or leukocytosis, COVIDneg and CXRclear, no evidence for VTE -clinically improved with scheduled IV steroids and breathing tx -Solumedrol was successfully weaned -Pt remained stable on RA and able to maintain O2 sat on ambulation -Pt advised to f/u with PCP and her primary Pulmonologist in the next 1-2 weeks  2.OSA -continued with nocturnal CPAP at night  3.Prediabetes -A1c 5.8 -continued on SSI coverage while on steroids  4.Hypertension -continued home meds as tolerated -Remained stable  Discharge Instructions   Allergies as of 07/06/2020      Reactions   Tomato Hives, Itching,  Other (See Comments)   ALSO REACTS TO KETCHUP   Latex Itching, Rash   Wool Alcohol [lanolin] Itching      Medication List    STOP taking these medications   AeroChamber MV inhaler   HYDROcodone-acetaminophen 5-325 MG tablet Commonly known as: Norco   oxyCODONE-acetaminophen 5-325 MG tablet Commonly known as: PERCOCET/ROXICET   traMADol 50 MG tablet Commonly known as: ULTRAM     TAKE these medications   Accu-Chek Guide Me w/Device Kit 1 each by Does not apply route daily. Dx; Prediabetets; Prednisone use   Accu-Chek Guide test strip Generic drug: glucose blood Use as instructed daily before breakfast. Dx; Prediabetets; Prednisone use   acetaminophen 500 MG tablet Commonly known as: TYLENOL Take 500 mg by mouth every 6 (six) hours as needed for mild pain.   albuterol 108 (90 Base) MCG/ACT inhaler Commonly known as: VENTOLIN HFA Inhale 2 puffs into the lungs every 6 (six) hours as needed for wheezing or shortness of breath.   albuterol (2.5 MG/3ML) 0.083% nebulizer solution Commonly known as: PROVENTIL Take 3 mLs (2.5 mg total) by nebulization every 6 (six) hours as needed for wheezing or shortness of breath.   aspirin 81 MG EC tablet Take 1 tablet (81 mg total) by mouth daily.   cloNIDine 0.1 MG tablet Commonly known as: CATAPRES Take 1 tablet (0.1 mg total) by mouth 2 (two) times daily. Must keep office visit tomorrow for additional refills. What changed: Another medication with the same name was added. Make sure you understand how and when to take each.   cloNIDine 0.1 MG tablet Commonly known as: CATAPRES Take 1 tablet (0.1 mg total) by mouth 2 (two) times daily. What changed: You were already taking a medication with the same name, and this prescription  was added. Make sure you understand how and when to take each.   Dulera 200-5 MCG/ACT Aero Generic drug: mometasone-formoterol Inhale 2 puffs into the lungs 2 (two) times daily.   fluticasone 50 MCG/ACT  nasal spray Commonly known as: FLONASE Place 1 spray into both nostrils daily. What changed:   when to take this  reasons to take this   gabapentin 300 MG capsule Commonly known as: NEURONTIN Take 2 capsules (600 mg total) by mouth 2 (two) times daily. What changed: Another medication with the same name was removed. Continue taking this medication, and follow the directions you see here.   HALLS COUGH DROPS MT Use as directed 1 drop in the mouth or throat 2 (two) times daily as needed (sore throat and cough).   ipratropium 0.02 % nebulizer solution Commonly known as: ATROVENT Take 2.5 mLs (0.5 mg total) by nebulization 2 (two) times daily as needed for wheezing or shortness of breath.   loratadine 10 MG tablet Commonly known as: CLARITIN Take 1 tablet (10 mg total) by mouth daily.   meloxicam 7.5 MG tablet Commonly known as: MOBIC Take 1 tablet (7.5 mg total) by mouth daily.   metFORMIN 500 MG tablet Commonly known as: GLUCOPHAGE Take 1 tablet (500 mg total) by mouth 2 (two) times daily with a meal. Dx; Prediabetes   methocarbamol 500 MG tablet Commonly known as: ROBAXIN TAKE 1 TABLET (500 MG TOTAL) BY MOUTH EVERY 8 (EIGHT) HOURS AS NEEDED FOR MUSCLE SPASMS.   Misc. Devices Misc Blood pressure monitor  Dx: Hypertension   montelukast 10 MG tablet Commonly known as: SINGULAIR Take 1 tablet (10 mg total) by mouth at bedtime.   pantoprazole 40 MG tablet Commonly known as: PROTONIX Take 1 tablet (40 mg total) by mouth daily at 12 noon.   predniSONE 10 MG tablet Commonly known as: DELTASONE Take 4 tablets (40 mg total) by mouth daily with breakfast for 2 days, THEN 2 tablets (20 mg total) daily with breakfast for 2 days, THEN 1 tablet (10 mg total) daily with breakfast for 2 days, THEN 0.5 tablets (5 mg total) daily with breakfast for 2 days. Taper dose: $RemoveBef'40mg'EeTlVZVatU$  po daily x 2 days, then $RemoveBe'20mg'bzYarbazU$  po daily x 2 days, then $RemoveBe'10mg'LvYtCUWzo$  po daily x 2 day, then $RemoveB'5mg'iUdDARnL$  po daily x 2 days, then stop,  zero refills. Start taking on: July 06, 2020 What changed: See the new instructions.   urea 20 % cream Commonly known as: CARMOL Apply 1 application topically daily as needed (For dry feet).       Follow-up Information    [name], MD. Schedule an appointment as soon as possible for a visit in 1 week(s).   Specialty: Family Medicine Contact information: [address] [place] [address] [phone]              Allergies  Allergen Reactions  . Tomato Hives, Itching and Other (See Comments)    ALSO REACTS TO KETCHUP  . Latex Itching and Rash  . Wool Alcohol [Lanolin] Itching   Procedures/Studies: DG Chest Portable 1 View  Result Date: 07/02/2020 CLINICAL DATA:  Shortness of breath EXAM: PORTABLE CHEST 1 VIEW COMPARISON:  10/09/2019 FINDINGS: Heart and mediastinal contours are within normal limits. No focal opacities or effusions. No acute bony abnormality. IMPRESSION: No active disease. Electronically Signed   By: Rolm Baptise M.D.   On: 07/02/2020 22:15   DG Toe 3rd Left  Result Date: 06/22/2020 CLINICAL DATA:  History of the left  third toe fracture. Subsequent encounter. EXAM: LEFT THIRD TOE COMPARISON:  Plain films left foot 05/26/2020. FINDINGS: Nondisplaced fracture of the distal metaphysis of the proximal phalanx of the left third toe seen on the prior examination demonstrates progressive healing. Fracture line is barely perceptible. No new abnormality is identified. IMPRESSION: Healing nondisplaced fracture of the proximal phalanx of the left third toe. Electronically Signed   By: Inge Rise M.D.   On: 06/22/2020 16:11     Subjective: Very eager to go home today  Discharge Exam: Vitals:   07/05/20 2207 07/06/20 0431  BP:  (!) 149/75  Pulse:  87  Resp:  18  Temp:  97.8 F (36.6 C)  SpO2: 100% 100%   Vitals:   07/05/20 2203 07/05/20 2207 07/06/20 0431 07/06/20 0500  BP:   (!) 149/75   Pulse:   87   Resp:   18   Temp:   97.8 F  (36.6 C)   TempSrc:   Oral   SpO2: 100% 100% 100%   Weight:    116.6 kg    General: Pt is alert, awake, not in acute distress Cardiovascular: RRR, S1/S2 +, no rubs, no gallops Respiratory: CTA bilaterally, no wheezing, no rhonchi Abdominal: Soft, NT, ND, bowel sounds + Extremities: no edema, no cyanosis   The results of significant diagnostics from this hospitalization (including imaging, microbiology, ancillary and laboratory) are listed below for reference.     Microbiology: Recent Results (from the past 240 hour(s))  SARS Coronavirus 2 by RT PCR (hospital order, performed in [facility] hospital lab) Nasopharyngeal Nasopharyngeal Swab     Status: None   Collection Time: 07/02/20  8:17 PM   Specimen: Nasopharyngeal Swab  Result Value Ref Range Status   SARS Coronavirus 2 NEGATIVE NEGATIVE Final    Comment: (NOTE) SARS-CoV-2 target nucleic acids are NOT DETECTED.  The SARS-CoV-2 RNA is generally detectable in upper and lower respiratory specimens during the acute phase of infection. The lowest concentration of SARS-CoV-2 viral copies this assay can detect is 250 copies / mL. A negative result does not preclude SARS-CoV-2 infection and should not be used as the sole basis for treatment or other patient management decisions.  A negative result may occur with improper specimen collection / handling, submission of specimen other than nasopharyngeal swab, presence of viral mutation(s) within the areas targeted by this assay, and inadequate number of viral copies (<250 copies / mL). A negative result must be combined with clinical observations, patient history, and epidemiological information.  Fact Sheet for Patients:   StrictlyIdeas.no  Fact Sheet for Healthcare Providers: BankingDealers.co.za  This test is not yet approved or  cleared by the [place] FDA and has been authorized for detection and/or diagnosis of SARS-CoV-2  by FDA under an Emergency Use Authorization (EUA).  This EUA will remain in effect (meaning this test can be used) for the duration of the COVID-19 declaration under Section 564(b)(1) of the Act, 21 U.S.C. section 360bbb-3(b)(1), unless the authorization is terminated or revoked sooner.  Performed at [facility], [place] [address], [place], [place] [address]      Labs: BNP (last 3 results) Recent Labs    10/09/19 1812 07/02/20 2022  BNP 40.0 33.2   Basic Metabolic Panel: Recent Labs  Lab 07/02/20 2022 07/03/20 0305 07/04/20 0429 07/05/20 0444 07/06/20 0459  NA 142 138 140 140 138  K 4.2 3.6 5.1 4.7 4.7  CL 105 107 103 104 101  CO2 27 18* 26  29 26  GLUCOSE 105* 268* 112* 117* 147*  BUN _0 CREATININE 0.94 0.96 0.89 0.91 0.97  CALCIUM 8.8* 7.6* 9.2 9.2 9.1  MG 2.6*  --   --   --   --    Liver Function Tests: No results for input(s): AST, ALT, ALKPHOS, BILITOT, PROT, ALBUMIN in the last 168 hours. No results for input(s): LIPASE, AMYLASE in the last 168 hours. No results for input(s): AMMONIA in the last 168 hours. CBC: Recent Labs  Lab 07/02/20 2022 07/03/20 0305 07/04/20 0429 07/05/20 0444 07/06/20 0459  WBC 7.7 9.3 16.3* 13.6* 11.6*  NEUTROABS 2.7  --   --   --   --   HGB 14.4 13.2 13.9 14.3 14.5  HCT 47.4* 43.2 44.9 45.4 45.5  MCV 89.1 90.4 87.5 87.8 86.5  PLT 287 251 300 323 316   Cardiac Enzymes: No results for input(s): CKTOTAL, CKMB, CKMBINDEX, TROPONINI in the last 168 hours. BNP: Invalid input(s): POCBNP CBG: Recent Labs  Lab 07/05/20 0807 07/05/20 1118 07/05/20 1659 07/05/20 1959 07/06/20 0729  GLUCAP 168* 120* 157* 167* 136*   D-Dimer No results for input(s): DDIMER in the last 72 hours. Hgb A1c No results for input(s): HGBA1C in the last 72 hours. Lipid Profile No results for input(s): CHOL, HDL, LDLCALC, TRIG, CHOLHDL, LDLDIRECT in the last 72 hours. Thyroid function studies No results for input(s): TSH, T4TOTAL,  T3FREE, THYROIDAB in the last 72 hours.  Invalid input(s): FREET3 Anemia work up No results for input(s): VITAMINB12, FOLATE, FERRITIN, TIBC, IRON, RETICCTPCT in the last 72 hours. Urinalysis    Component Value Date/Time   COLORURINE YELLOW 08/07/2018 1752   APPEARANCEUR CLEAR 08/07/2018 1752   LABSPEC 1.033 (H) 08/07/2018 1752   PHURINE 5.0 08/07/2018 1752   GLUCOSEU NEGATIVE 08/07/2018 1752   HGBUR NEGATIVE 08/07/2018 1752   BILIRUBINUR NEGATIVE 08/07/2018 1752   BILIRUBINUR small 05/26/2017 1539   KETONESUR NEGATIVE 08/07/2018 1752   PROTEINUR NEGATIVE 08/07/2018 1752   UROBILINOGEN 1.0 05/26/2017 1539   UROBILINOGEN 1.0 03/22/2014 0805   NITRITE NEGATIVE 08/07/2018 1752   LEUKOCYTESUR NEGATIVE 08/07/2018 1752   Sepsis Labs Invalid input(s): PROCALCITONIN,  WBC,  LACTICIDVEN Microbiology Recent Results (from the past 240 hour(s))  SARS Coronavirus 2 by RT PCR (hospital order, performed in [place] hospital lab) Nasopharyngeal Nasopharyngeal Swab     Status: None   Collection Time: 07/02/20  8:17 PM   Specimen: Nasopharyngeal Swab  Result Value Ref Range Status   SARS Coronavirus 2 NEGATIVE NEGATIVE Final    Comment: (NOTE) SARS-CoV-2 target nucleic acids are NOT DETECTED.  The SARS-CoV-2 RNA is generally detectable in upper and lower respiratory specimens during the acute phase of infection. The lowest concentration of SARS-CoV-2 viral copies this assay can detect is 250 copies / mL. A negative result does not preclude SARS-CoV-2 infection and should not be used as the sole basis for treatment or other patient management decisions.  A negative result may occur with improper specimen collection / handling, submission of specimen other than nasopharyngeal swab, presence of viral mutation(s) within the areas targeted by this assay, and inadequate number of viral copies (<250 copies / mL). A negative result must be combined with clinical observations, patient history,  and epidemiological information.  Fact Sheet for Patients:   StrictlyIdeas.no  Fact Sheet for Healthcare Providers: BankingDealers.co.za  This test is not yet approved or  cleared by the [place] FDA and has been authorized for detection and/or  diagnosis of SARS-CoV-2 by FDA under an Emergency Use Authorization (EUA).  This EUA will remain in effect (meaning this test can be used) for the duration of the COVID-19 declaration under Section 564(b)(1) of the Act, 21 U.S.C. section 360bbb-3(b)(1), unless the authorization is terminated or revoked sooner.  Performed at [facility], [place] [address], [place], [place] [address]    Time spent: 30 min  SIGNED:   Marylu Lund, MD  [facility] 07/06/2020, 9:12 AM  If 7PM-7AM, please contact night-coverage

## 2020-07-07 ENCOUNTER — Telehealth: Payer: Self-pay

## 2020-07-07 NOTE — Telephone Encounter (Signed)
From the discharge call.  She has an appointment with Dr Kevin Fenton 07/16/2020   She said she is " doing so, so."  no questions or concerns at this time    she said that she has all of her medications and did not have any questions about her med regime. She said that she understands the prednisone taper as she has done a taper many times in the past.  She said she is taking the clonidine 0.1 mg twice daily.  She has a nebulizer and BP monitor.   She has not picked up her glucometer at her pharmacy. Instructed her to call the pharmacy and inquire if she can pick it up now or if a new prescription is needed and let this clinic know if a new prescription is needed

## 2020-07-07 NOTE — Telephone Encounter (Signed)
Transition Care Management Follow-up Telephone Call  Date of discharge and from where: 07/06/2020, [facility]   How have you been since you were released from the hospital? She said she is " doing so, so."   Any questions or concerns?  none at this time  Items Reviewed:  Did the pt receive and understand the discharge instructions provided?  yes  Medications obtained and verified?  she said that she has all of her medications and did not have any questions about her med regime. She said that she understands the prednisone taper as she has done a taper many times in the past.  She said she is taking the clonidine 0.1 mg twice daily.   Any new allergies since your discharge?  none reported   Do you have support at home?  her husband and 71 yo son.  No home health ordered  She has a nebulizer and BP monitor.   She has not picked up her glucometer at her pharmacy. Instructed her to call the pharmacy and inquire if she can pick it up now or if a new prescription is needed and let this clinic know if a new prescription is needed  Functional Questionnaire: (I = Independent and D = Dependent) ADLs: independent  Follow up appointments reviewed:   PCP Hospital f/u appt confirmed?Dr Alvis Lemmings 07/16/2020  Specialist Hospital f/u appt confirmed? pulmonary 07/09/2020  Are transportation arrangements needed? no, she will try to get a ride with someone. She has the option of scheduling transportation with [facility] but she said that process is very frustrating.  She is interested in completing a SCAT application and will do so when she is in the clinic for her appointment   If their condition worsens, is the pt aware to call PCP or go to the Emergency Dept.?  yes  Was the patient provided with contact information for the PCP's office or ED?  she has the clinic phone number  Was to pt encouraged to call back with questions or concerns?  yes

## 2020-07-09 ENCOUNTER — Ambulatory Visit: Payer: Medicaid Other

## 2020-07-13 ENCOUNTER — Ambulatory Visit: Payer: Medicaid Other | Admitting: Podiatrist

## 2020-07-14 ENCOUNTER — Other Ambulatory Visit: Payer: Self-pay | Admitting: Family Medicine

## 2020-07-14 DIAGNOSIS — G629 Polyneuropathy, unspecified: Secondary | ICD-10-CM

## 2020-07-14 NOTE — Telephone Encounter (Signed)
Requested medication (s) are due for refill today: yes  Requested medication (s) are on the active medication list: yes  Last refill:  03/16/20 #60 0 refill  Future visit scheduled: yes in 2 days   Notes to clinic:  not delegated per protocol     Requested Prescriptions  Pending Prescriptions Disp Refills   methocarbamol (ROBAXIN) 500 MG tablet [Pharmacy Med Name: METHOCARBAMOL 500 MG TABLET] 60 tablet 0    Sig: TAKE 1 TABLET (500 MG TOTAL) BY MOUTH EVERY 8 (EIGHT) HOURS AS NEEDED FOR MUSCLE SPASMS.      Not Delegated - Analgesics:  Muscle Relaxants Failed - 07/14/2020  5:01 PM      Failed - This refill cannot be delegated      Failed - Valid encounter within last 6 months    Recent Outpatient Visits           8 months ago Acute midline low back pain without sciatica   [facility] Hoy Register, MD   1 year ago Vaginal candidiasis   [facility] Hoy Register, MD   1 year ago Pain, dental   [facility] Hoy Register, MD   1 year ago Annual physical exam   [place] [facility] Hoy Register, MD   1 year ago Neuropathy   [facility] Hoy Register, MD       Future Appointments             In 2 days Hoy Register, MD [facility]

## 2020-07-16 ENCOUNTER — Ambulatory Visit: Payer: Medicaid Other | Admitting: Family Medicine

## 2020-07-20 ENCOUNTER — Telehealth: Payer: Self-pay | Admitting: Pulmonary Disease

## 2020-07-22 NOTE — Telephone Encounter (Signed)
Spoke with Patient.  Patient scheduled 08/06/20 at 1100 for regular flu vaccine, and 08/14/20 at 2pm to start Fasenra injections.  Patient aware to bring Epi pen and office policy of 2 hour observation after first injection.

## 2020-08-06 ENCOUNTER — Ambulatory Visit: Payer: Medicaid Other

## 2020-08-14 ENCOUNTER — Ambulatory Visit: Payer: Medicaid Other

## 2020-09-02 ENCOUNTER — Ambulatory Visit: Payer: Medicaid Other | Admitting: Family Medicine

## 2020-09-03 ENCOUNTER — Telehealth: Payer: Self-pay | Admitting: Pulmonary Disease

## 2020-09-03 NOTE — Telephone Encounter (Signed)
lmtcb for pt.  

## 2020-09-08 ENCOUNTER — Telehealth: Payer: Self-pay | Admitting: Family Medicine

## 2020-09-08 DIAGNOSIS — R7303 Prediabetes: Secondary | ICD-10-CM

## 2020-09-08 NOTE — Telephone Encounter (Signed)
Medication Refill - Medication: fluconazole (DIFLUCAN) 150 MG tablet,metFORMIN (GLUCOPHAGE) 500 MG tablet (Patient stated that she is currently homeless and living in a hotel and needs medications called in for 60-90 days for her convienence and she would like a callback at [phone] room 144, once medications have been transferred to her new pharmacy today.)  Has the patient contacted their pharmacy? Yes (Agent: If no, request that the patient contact the pharmacy for the refill.) (Agent: If yes, when and what did the pharmacy advise?)Contact PCP  Preferred Pharmacy (with phone number or street name):  [id number] [address] Phone:  [phone]  Fax:  [phone]       Agent: Please be advised that RX refills may take up to 3 business days. We ask that you follow-up with your pharmacy.

## 2020-09-08 NOTE — Telephone Encounter (Signed)
Requested medication (s) are due for refill today: no  Requested medication (s) are on the active medication list: yes   Future visit scheduled:no  Notes to clinic:  Patient stated that she is currently homeless and living in a hotel and needs medications called in for 60-90 days for her convienence and she would like a callback at [phone] room 144, once medications have been transferred to her new pharmacy today.)  Patient requesting diflucan but not on current list     Requested Prescriptions  Pending Prescriptions Disp Refills   metFORMIN (GLUCOPHAGE) 500 MG tablet 60 tablet 6    Sig: Take 1 tablet (500 mg total) by mouth 2 (two) times daily with a meal. Dx; Prediabetes      Endocrinology:  Diabetes - Biguanides Failed - 09/08/2020  9:53 AM      Failed - Valid encounter within last 6 months    Recent Outpatient Visits           10 months ago Acute midline low back pain without sciatica   [employer], Charlane Ferretti, MD   1 year ago Vaginal candidiasis   [place], [place], MD   1 year ago Pain, dental   [place], Charlane Ferretti, MD   1 year ago Annual physical exam   [place], Charlane Ferretti, MD   2 years ago Neuropathy   [place], Charlane Ferretti, MD              Passed - Cr in normal range and within 360 days    Creatinine, Ser  Date Value Ref Range Status  07/06/2020 0.97 0.44 - 1.00 mg/dL Final          Passed - HBA1C is between 0 and 7.9 and within 180 days    Hgb A1c MFr Bld  Date Value Ref Range Status  07/03/2020 5.8 (H) 4.8 - 5.6 % Final    Comment:    (NOTE) Pre diabetes:          5.7%-6.4%  Diabetes:              >6.4%  Glycemic control for   <7.0% adults with diabetes           Passed - AA eGFR in normal range and within 360 days    GFR calc Af Amer  Date Value Ref  Range Status  07/06/2020 >60 >60 mL/min Final   GFR calc non Af Amer  Date Value Ref Range Status  07/06/2020 >60 >60 mL/min Final

## 2020-09-15 ENCOUNTER — Other Ambulatory Visit: Payer: Self-pay | Admitting: Family Medicine

## 2020-09-15 DIAGNOSIS — B3731 Acute candidiasis of vulva and vagina: Secondary | ICD-10-CM

## 2020-09-15 DIAGNOSIS — B373 Candidiasis of vulva and vagina: Secondary | ICD-10-CM

## 2020-09-15 MED ORDER — FLUCONAZOLE 150 MG PO TABS: 150.0000 mg | ORAL_TABLET | Freq: Once | ORAL | 0 refills | Status: AC

## 2020-09-15 MED ORDER — METFORMIN HCL 500 MG PO TABS: 500.0000 mg | ORAL_TABLET | Freq: Two times a day (BID) | ORAL | 0 refills | Status: DC

## 2020-09-15 NOTE — Telephone Encounter (Signed)
PLS fu WITH PT IN REGARDS TO HER MED REQUEST AS SHE STATES SHE IS HOMELESS AND HAS TO HAVE MEDS. [phone]

## 2020-09-15 NOTE — Telephone Encounter (Signed)
Pt is requesting 90 day supply of metformin and also requesting Diflucan pill

## 2020-09-15 NOTE — Telephone Encounter (Signed)
Requested medication (s) are due for refill today: no  Requested medication (s) are on the active medication list: no  Last refill:  start: 10/30/19 end: 10/30/19 #2 6 refills  Future visit scheduled: no  Notes to clinic: medication not on med list, ordered once. Do you want to renew Rx?     Requested Prescriptions  Pending Prescriptions Disp Refills   fluconazole (DIFLUCAN) 150 MG tablet [Pharmacy Med Name: FLUCONAZOLE 150 MG TABLET] 2 tablet 6    Sig: TAKE 1 TABLET BY MOUTH ONCE FOR 1 DOSE. THEN REPEAT IN 72 HRS TO BE USED WITH PREDNSIONE      Off-Protocol Failed - 09/15/2020  4:11 PM      Failed - Medication not assigned to a protocol, review manually.      Passed - Valid encounter within last 12 months    Recent Outpatient Visits           10 months ago Acute midline low back pain without sciatica   [facility] Hoy Register, MD   1 year ago Vaginal candidiasis   [place] [facility] Hoy Register, MD   1 year ago Pain, dental   [place] [facility] Hoy Register, MD   1 year ago Annual physical exam   [place] [facility] Hoy Register, MD   2 years ago Neuropathy   [facility] Hoy Register, MD

## 2020-09-15 NOTE — Telephone Encounter (Signed)
Done

## 2020-09-15 NOTE — Addendum Note (Signed)
Addended by: Hoy Register on: 09/15/2020 05:03 PM   Modules accepted: Orders

## 2020-10-06 ENCOUNTER — Encounter (HOSPITAL_COMMUNITY): Payer: Self-pay

## 2020-10-06 ENCOUNTER — Other Ambulatory Visit: Payer: Self-pay

## 2020-10-06 ENCOUNTER — Ambulatory Visit (HOSPITAL_COMMUNITY)
Admission: EM | Admit: 2020-10-06 | Discharge: 2020-10-06 | Disposition: A | Discharge status: Home / Self Care | Payer: Medicaid Other | Attending: Urgent Care | Admitting: Urgent Care

## 2020-10-06 ENCOUNTER — Ambulatory Visit (INDEPENDENT_AMBULATORY_CARE_PROVIDER_SITE_OTHER): Payer: Medicaid Other

## 2020-10-06 DIAGNOSIS — S8991XA Unspecified injury of right lower leg, initial encounter: Secondary | ICD-10-CM

## 2020-10-06 DIAGNOSIS — M25561 Pain in right knee: Secondary | ICD-10-CM

## 2020-10-06 DIAGNOSIS — W19XXXA Unspecified fall, initial encounter: Secondary | ICD-10-CM

## 2020-10-06 DIAGNOSIS — S8001XA Contusion of right knee, initial encounter: Secondary | ICD-10-CM

## 2020-10-06 DIAGNOSIS — M25461 Effusion, right knee: Secondary | ICD-10-CM

## 2020-10-06 DIAGNOSIS — M1711 Unilateral primary osteoarthritis, right knee: Secondary | ICD-10-CM

## 2020-10-06 MED ORDER — PREDNISONE 20 MG PO TABS: ORAL_TABLET | ORAL | 0 refills | Status: DC

## 2020-10-06 NOTE — ED Triage Notes (Signed)
Pt in with c/o right knee pain that started last week after she slipped on the bus. States that she has noticed swelling and bruising  Pt has tried tylenol and ace wrap with no relief

## 2020-10-06 NOTE — ED Provider Notes (Signed)
[place]   MRN: [id number] DOB: 05/17/1966  Subjective:   Kathleen Rodriguez is a 54 y.o. female presenting for 1 week history of persistent right knee pain.  Patient states that symptoms started after she slipped on the bus and ended with her leg bent underneath her thigh.  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  Has had significant difficulty bearing weight.  Has been using a cane to ambulate which is normal for her.  No current facility-administered medications for this encounter.  Current Outpatient Medications:  .  acetaminophen (TYLENOL) 500 MG tablet, Take 500 mg by mouth every 6 (six) hours as needed for mild pain., Disp: , Rfl:  .  albuterol (PROVENTIL) (2.5 MG/3ML) 0.083% nebulizer solution, Take 3 mLs (2.5 mg total) by nebulization every 6 (six) hours as needed for wheezing or shortness of breath., Disp: 360 mL, Rfl: 2 .  albuterol (VENTOLIN HFA) 108 (90 Base) MCG/ACT inhaler, Inhale 2 puffs into the lungs every 6 (six) hours as needed for wheezing or shortness of breath., Disp: 6.7 g, Rfl: 5 .  aspirin 81 MG EC tablet, Take 1 tablet (81 mg total) by mouth daily., Disp: 30 tablet, Rfl: 3 .  Blood Glucose Monitoring Suppl (ACCU-CHEK GUIDE ME) w/Device KIT, 1 each by Does not apply route daily. Dx; Prediabetets; Prednisone use, Disp: 1 kit, Rfl: 0 .  cloNIDine (CATAPRES) 0.1 MG tablet, Take 1 tablet (0.1 mg total) by mouth 2 (two) times daily. Must keep office visit tomorrow for additional refills., Disp: 60 tablet, Rfl: 0 .  cloNIDine (CATAPRES) 0.1 MG tablet, Take 1 tablet (0.1 mg total) by mouth 2 (two) times daily., Disp: 60 tablet, Rfl: 0 .  fluticasone (FLONASE) 50 MCG/ACT nasal spray, Place 1 spray into both nostrils daily. (Patient taking differently: Place 1 spray into both nostrils daily as needed for allergies. ), Disp: 16 g, Rfl: 5 .  gabapentin (NEURONTIN) 300 MG capsule, Take 2 capsules (600 mg total) by mouth 2  (two) times daily., Disp: 120 capsule, Rfl: 3 .  glucose blood (ACCU-CHEK GUIDE) test strip, Use as instructed daily before breakfast. Dx; Prediabetets; Prednisone use, Disp: 30 each, Rfl: 12 .  ipratropium (ATROVENT) 0.02 % nebulizer solution, Take 2.5 mLs (0.5 mg total) by nebulization 2 (two) times daily as needed for wheezing or shortness of breath., Disp: 360 mL, Rfl: 5 .  loratadine (CLARITIN) 10 MG tablet, Take 1 tablet (10 mg total) by mouth daily., Disp: 30 tablet, Rfl: 11 .  meloxicam (MOBIC) 7.5 MG tablet, Take 1 tablet (7.5 mg total) by mouth daily., Disp: 30 tablet, Rfl: 1 .  Menthol (HALLS COUGH DROPS MT), Use as directed 1 drop in the mouth or throat 2 (two) times daily as needed (sore throat and cough)., Disp: , Rfl:  .  metFORMIN (GLUCOPHAGE) 500 MG tablet, Take 1 tablet (500 mg total) by mouth 2 (two) times daily with a meal., Disp: 180 tablet, Rfl: 0 .  methocarbamol (ROBAXIN) 500 MG tablet, TAKE 1 TABLET (500 MG TOTAL) BY MOUTH EVERY 8 (EIGHT) HOURS AS NEEDED FOR MUSCLE SPASMS., Disp: 60 tablet, Rfl: 0 .  Misc. Devices MISC, Blood pressure monitor  Dx: Hypertension, Disp: 1 each, Rfl: 0 .  mometasone-formoterol (DULERA) 200-5 MCG/ACT AERO, Inhale 2 puffs into the lungs 2 (two) times daily., Disp: 8.8 g, Rfl: 5 .  montelukast (SINGULAIR) 10 MG tablet, Take 1 tablet (10 mg total) by mouth at bedtime.,  Disp: 90 tablet, Rfl: 1 .  pantoprazole (PROTONIX) 40 MG tablet, Take 1 tablet (40 mg total) by mouth daily at 12 noon., Disp: 30 tablet, Rfl: 0 .  urea (CARMOL) 20 % cream, Apply 1 application topically daily as needed (For dry feet)., Disp: 85 g, Rfl: 1   Allergies  Allergen Reactions  . Tomato Hives, Itching and Other (See Comments)    ALSO REACTS TO KETCHUP  . Latex Itching and Rash  . Wool Alcohol [Lanolin] Itching    Past Medical History:  Diagnosis Date  . Arthritis    "knees, lower back; legs, ankles" (01/27/2016)  . Asthma    followed by Dr. Halford Chessman  . CHF (congestive  heart failure) ([place]) 2016   "when I went into a coma"  . Cocaine abuse ([place])   . 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 Diabetes mellitus without complication ([place])   . Dyspnea   . GERD (gastroesophageal reflux disease)   . Hypertension    "doctor took me off RX in 2016" (01/27/2016)  . Influenza B April 0626   Complicated by multi-organ failure  . OSA on CPAP "since " 03/20/2013  . Pneumonia 2016  . Required emergent intubation    asthma exacerbation in 2016  . Tobacco abuse   . Upper airway cough syndrome      Past Surgical History:  Procedure Laterality Date  . [place]   as a teenager , cyst was benign  . CESAREAN SECTION  2006  . LACERATION REPAIR Right ~ 1997   "tried to cut myself"  . TOTAL KNEE ARTHROPLASTY Left 09/01/2016   Procedure: LEFT TOTAL KNEE ARTHROPLASTY;  Surgeon: Leandrew Koyanagi, MD;  Location: [facility];  Service: Orthopedics;  Laterality: Left;  . TUBAL LIGATION  2006    Family History  Problem Relation Age of Onset  . Hypertension Mother   . HIV/AIDS Father     Social History   Tobacco Use  . Smoking status: Former Smoker    Packs/day: 0.25    Years: 21.00    Pack years: 5.25    Types: Cigarettes    Start date: 68    Quit date: 01/31/2013    Years since quitting: 7.6  . Smokeless tobacco: Never Used  Vaping Use  . Vaping Use: Never used  Substance Use Topics  . 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 Drug use: No    Types: "Crack" cocaine    Comment: 01/27/2016 "clean from smoking crack since 2007"    ROS   Objective:   Vitals: BP 128/77 (BP Location: Right Arm)   Pulse 96   Temp 98 F (36.7 C) (Oral)   SpO2 98%   Physical Exam Constitutional:      General: She is not in acute distress.    Appearance: Normal appearance. She is well-developed. She is obese. She is not ill-appearing, toxic-appearing or diaphoretic.  HENT:     Head: Normocephalic and atraumatic.     Nose: Nose  normal.     Mouth/Throat:     Mouth: Mucous membranes are moist.     Pharynx: Oropharynx is clear.  Eyes:     General: No scleral icterus.    Extraocular Movements: Extraocular movements intact.     Pupils: Pupils are equal, round, and reactive to light.  Cardiovascular:     Rate and Rhythm: Normal rate.  Pulmonary:     Effort: Pulmonary effort is  normal.  Musculoskeletal:     Right knee: Swelling and bony tenderness present. No deformity, effusion, erythema, ecchymosis, lacerations or crepitus. Decreased range of motion. Tenderness present over the lateral joint line and patellar tendon. No medial joint line tenderness. Normal alignment and normal patellar mobility.  Skin:    General: Skin is warm and dry.  Neurological:     General: No focal deficit present.     Mental Status: She is alert and oriented to person, place, and time.  Psychiatric:        Mood and Affect: Mood normal.        Behavior: Behavior normal.        Thought Content: Thought content normal.        Judgment: Judgment normal.     DG Knee Complete 4 Views Right  Result Date: 10/06/2020 CLINICAL DATA:  Right knee pain after falling 6 days ago. EXAM: RIGHT KNEE - COMPLETE 4+ VIEW COMPARISON:  Right knee x-rays dated September 01, 2019. FINDINGS: No acute fracture or dislocation. Chronic small joint effusion. Unchanged tricompartmental joint space narrowing and marginal osteophytes, moderate in the medial and lateral compartments. Soft tissues are unremarkable. IMPRESSION: 1. No acute osseous abnormality. 2. Unchanged tricompartmental osteoarthritis. Electronically Signed   By: Titus Dubin M.D.   On: 10/06/2020 19:34    Assessment and Plan :   PDMP not reviewed this encounter.  1. Pain and swelling of right knee   2. Right knee injury, initial encounter   3. Arthritis of knee, right   4. Contusion of right knee, initial encounter     Recommended prednisone course that she has been using Tylenol and  ibuprofen with minimal relief.  X-rays negative for fracture, wrapped her right knee in a 6 inch Ace wrap.  Recommend close follow-up with her orthopedist. Counseled patient on potential for adverse effects with medications prescribed/recommended today, ER and return-to-clinic precautions discussed, patient verbalized understanding.    Jaynee Eagles, [place] 10/06/20 1947

## 2020-10-12 ENCOUNTER — Other Ambulatory Visit: Payer: Self-pay | Admitting: Family Medicine

## 2020-10-12 MED ORDER — FLUCONAZOLE 150 MG PO TABS: 150.0000 mg | ORAL_TABLET | Freq: Once | ORAL | 0 refills | Status: AC

## 2020-10-13 ENCOUNTER — Telehealth: Payer: Self-pay | Admitting: Pulmonary Disease

## 2020-10-13 DIAGNOSIS — J4551 Severe persistent asthma with (acute) exacerbation: Secondary | ICD-10-CM

## 2020-10-13 MED ORDER — ALBUTEROL SULFATE HFA 108 (90 BASE) MCG/ACT IN AERS: 2.0000 | INHALATION_SPRAY | Freq: Four times a day (QID) | RESPIRATORY_TRACT | 0 refills | Status: DC | PRN

## 2020-10-13 MED ORDER — ALBUTEROL SULFATE (2.5 MG/3ML) 0.083% IN NEBU: 2.5000 mg | INHALATION_SOLUTION | Freq: Four times a day (QID) | RESPIRATORY_TRACT | 0 refills | Status: DC | PRN

## 2020-10-13 NOTE — Telephone Encounter (Signed)
Spoke with the pt  She is needing albuterol inhaler and neb refilled  Advised will refill x 1 only and needs appt for f/u since overdue  She is wanting to see TP again since VS without openings until 11/2019  Appt with TP for 10/29/20 Rxs refilled  Nothing further needed

## 2020-10-13 NOTE — Telephone Encounter (Signed)
Patient has no showed or cancelled numerous injection and OV's with providers.  LOV was 12/31/19 with Tammy, NP. I called and spoke with Patient. Patient is aware she may need a new PA for Fasenra and need to be seen in office for follow up asthma.  Patient has scheduled OV with Tammy,NP 10/29/20 at 1530.    Per Dr. Craige Cotta 05/01/20-  Do not schedule fasenra injections with her anymore for now.  She will need to have ROV with me first before rescheduling fasenra injection.

## 2020-10-20 ENCOUNTER — Telehealth: Payer: Self-pay | Admitting: Family Medicine

## 2020-10-20 NOTE — Telephone Encounter (Signed)
Will route to PCP for review. 

## 2020-10-20 NOTE — Telephone Encounter (Signed)
Copied from CRM [id number]. Topic: General - Other >> Oct 20, 2020 12:31 PM Jaquita Rector A wrote: Reason for CRM: Patient would like Rx sent to pharmacy for yeast infection stated that she was on antibiotics and it gave her a yeast infection. [id number] [place] [place] - [address] AT [facility] [address]  Phone:  [phone] Fax:  [phone]

## 2020-10-21 MED ORDER — FLUCONAZOLE 150 MG PO TABS: 150.0000 mg | ORAL_TABLET | Freq: Once | ORAL | 0 refills | Status: AC

## 2020-10-21 NOTE — Telephone Encounter (Signed)
Done

## 2020-10-29 ENCOUNTER — Ambulatory Visit: Payer: Medicaid Other | Admitting: Adult Health

## 2020-10-29 ENCOUNTER — Other Ambulatory Visit: Payer: Self-pay

## 2020-10-29 ENCOUNTER — Encounter: Payer: Self-pay | Admitting: Adult Health

## 2020-10-29 VITALS — BP 106/80 | HR 95 | Temp 97.3°F | Ht 65.0 in | Wt 253.2 lb

## 2020-10-29 DIAGNOSIS — J45998 Other asthma: Secondary | ICD-10-CM | POA: Diagnosis not present

## 2020-10-29 DIAGNOSIS — G4733 Obstructive sleep apnea (adult) (pediatric): Secondary | ICD-10-CM

## 2020-10-29 DIAGNOSIS — Z23 Encounter for immunization: Secondary | ICD-10-CM

## 2020-10-29 MED ORDER — BREO ELLIPTA 200-25 MCG/INH IN AEPB: 1.0000 | INHALATION_SPRAY | Freq: Every day | RESPIRATORY_TRACT | 5 refills | Status: DC

## 2020-10-29 NOTE — Assessment & Plan Note (Signed)
Moderate obstructive sleep apnea recommend restarting CPAP.  Patient education given  Plan  Patient Instructions  Change Dulera to BREO 1 puff daily , rinse after use.  Continue on Singulair daily Continue on Claritin daily Albuterol as needed for wheezing and shortness of breath Restart Fasenra paperwork.   Restart CPAP at bedtime Goal was to wear for greater than 4 hours each night Do not drive if sleepy  Follow-up with Dr Craige Cotta in 3 months and As needed

## 2020-10-29 NOTE — Patient Instructions (Addendum)
Change Dulera to BREO 1 puff daily , rinse after use.  Continue on Singulair daily Continue on Claritin daily Albuterol as needed for wheezing and shortness of breath Restart Fasenra paperwork.   Restart CPAP at bedtime Goal was to wear for greater than 4 hours each night Do not drive if sleepy  Follow-up with Dr Craige Cotta in 3 months and As needed

## 2020-10-29 NOTE — Assessment & Plan Note (Signed)
Difficult to control allergic asthma.  Patient will need to change Dulera to alternative.  We will switch to [facility] as she has tolerated this in the past. Would like for her to restart the [place] paperwork process.  She had been approved previously but was unable to begin injections due to personal issues.  Flu shot today Asthma action plan  Plan  Patient Instructions  Change Dulera to BREO 1 puff daily , rinse after use.  Continue on Singulair daily Continue on Claritin daily Albuterol as needed for wheezing and shortness of breath Restart Fasenra paperwork.   Restart CPAP at bedtime Goal was to wear for greater than 4 hours each night Do not drive if sleepy  Follow-up with Dr Craige Cotta in 3 months and As needed

## 2020-10-29 NOTE — Progress Notes (Signed)
_0  ID: [name], female    DOB: 27-Sep-1966, 55 y.o.   MRN: [id number]  Chief Complaint  Patient presents with  . Follow-up  . Asthma    Referring provider: [name], MD  HPI: 55 year old female former smoker followed for allergic asthma prone to frequent exacerbations, postinfectious related bronchiectasis, obstructive sleep apnea. Medical history significant for polysubstance abuse with cocaine Allergic reaction previously to Xolair  TEST/EVENTS :  CT chest4/15/14 >>no PE, no acute findings HRCT chest 03/12/18 >> mild BTX  Sleep tests:  PSG 04/19/13 >> AHI 16.4, SpO2 low 84%.  Cardiac tests:  Echo 12/25/18 >> EF 55 to 60%    10/29/2020 Follow up : Allergic Asthma  Patient presents for a follow-up visit for asthma.  Last seen March 2021.  Patient has severe allergic asthma.  She is prone to frequent exacerbations.  Was hospitalized in September with a severe asthma exacerbation. Patient says she remains on Dulera twice daily.  Has good and bad days.  Gets short of breath with heavy activity.  Currently no flare cough or wheezing. She does need an alternative to [facility] as it is no longer available at her pharmacy. Previously had been on Breo with no known issues. Patient was previously approved for [name] but did not start injections yet We discussed getting started on this. She remains on Singulair and Claritin daily. Patient is requesting flu shot today.  Patient has sleep apnea.  He says she has not wore his CPAP machine a long time.  We discussed restarting her CPAP.  Patient education on sleep apnea.  Says she has been under a lot of stress.  Recently had to move in her car sometimes does not work.   Allergies  Allergen Reactions  . Tomato Hives, Itching and Other (See Comments)    ALSO REACTS TO KETCHUP  . Latex Itching and Rash  . Wool Alcohol [Lanolin] Itching    Immunization History  Administered Date(s) Administered  .  Influenza,inj,Quad PF,6+ Mos 08/13/2013, 07/27/2015, 07/21/2016, 10/22/2017, 08/01/2018, 06/17/2019  . Pneumococcal Polysaccharide-23 06/17/2019  . Tdap 08/14/2013    Past Medical History:  Diagnosis Date  . Arthritis    "knees, lower back; legs, ankles" (01/27/2016)  . Asthma    followed by Dr. Halford Chessman  . CHF (congestive heart failure) ([place]) 2016   "when I went into a coma"  . Cocaine abuse ([place])   . 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 Diabetes mellitus without complication ([place])   . Dyspnea   . GERD (gastroesophageal reflux disease)   . Hypertension    "doctor took me off RX in 2016" (01/27/2016)  . Influenza B April 5009   Complicated by multi-organ failure  . OSA on CPAP "since " 03/20/2013  . Pneumonia 2016  . Required emergent intubation    asthma exacerbation in 2016  . Tobacco abuse   . Upper airway cough syndrome     Tobacco History: Social History   Tobacco Use  Smoking Status Former Smoker  . Packs/day: 0.25  . Years: 21.00  . Pack years: 5.25  . Types: Cigarettes  . Start date: 11  . Quit date: 01/31/2013  . Years since quitting: 7.7  Smokeless Tobacco Never Used   Counseling given: Not Answered   Outpatient Medications Prior to Visit  Medication Sig Dispense Refill  . acetaminophen (TYLENOL) 500 MG tablet Take 500 mg by mouth every 6 (six) hours as needed for mild pain.    Marland Kitchen albuterol (PROVENTIL) (2.5  MG/3ML) 0.083% nebulizer solution Take 3 mLs (2.5 mg total) by nebulization every 6 (six) hours as needed for wheezing or shortness of breath. 360 mL 0  . albuterol (VENTOLIN HFA) 108 (90 Base) MCG/ACT inhaler Inhale 2 puffs into the lungs every 6 (six) hours as needed for wheezing or shortness of breath. 6.7 g 0  . aspirin 81 MG EC tablet Take 1 tablet (81 mg total) by mouth daily. 30 tablet 3  . Blood Glucose Monitoring Suppl (ACCU-CHEK GUIDE ME) w/Device KIT 1 each by Does not apply route daily. Dx; Prediabetets; Prednisone use 1 kit 0  . cloNIDine  (CATAPRES) 0.1 MG tablet Take 1 tablet (0.1 mg total) by mouth 2 (two) times daily. Must keep office visit tomorrow for additional refills. (Patient taking differently: Take 0.2 mg by mouth 2 (two) times daily. Must keep office visit tomorrow for additional refills.) 60 tablet 0  . fluticasone (FLONASE) 50 MCG/ACT nasal spray Place 1 spray into both nostrils daily. (Patient taking differently: Place 1 spray into both nostrils daily as needed for allergies.) 16 g 5  . gabapentin (NEURONTIN) 300 MG capsule Take 2 capsules (600 mg total) by mouth 2 (two) times daily. 120 capsule 3  . glucose blood (ACCU-CHEK GUIDE) test strip Use as instructed daily before breakfast. Dx; Prediabetets; Prednisone use 30 each 12  . ipratropium (ATROVENT) 0.02 % nebulizer solution Take 2.5 mLs (0.5 mg total) by nebulization 2 (two) times daily as needed for wheezing or shortness of breath. 360 mL 5  . loratadine (CLARITIN) 10 MG tablet Take 1 tablet (10 mg total) by mouth daily. 30 tablet 11  . meloxicam (MOBIC) 7.5 MG tablet Take 1 tablet (7.5 mg total) by mouth daily. 30 tablet 1  . Menthol (HALLS COUGH DROPS MT) Use as directed 1 drop in the mouth or throat 2 (two) times daily as needed (sore throat and cough).    . metFORMIN (GLUCOPHAGE) 500 MG tablet Take 1 tablet (500 mg total) by mouth 2 (two) times daily with a meal. 180 tablet 0  . methocarbamol (ROBAXIN) 500 MG tablet TAKE 1 TABLET (500 MG TOTAL) BY MOUTH EVERY 8 (EIGHT) HOURS AS NEEDED FOR MUSCLE SPASMS. 60 tablet 0  . Misc. Devices MISC Blood pressure monitor  Dx: Hypertension 1 each 0  . montelukast (SINGULAIR) 10 MG tablet Take 1 tablet (10 mg total) by mouth at bedtime. 90 tablet 1  . pantoprazole (PROTONIX) 40 MG tablet Take 1 tablet (40 mg total) by mouth daily at 12 noon. 30 tablet 0  . predniSONE (DELTASONE) 20 MG tablet Take 2 tablets daily with breakfast. 10 tablet 0  . urea (CARMOL) 20 % cream Apply 1 application topically daily as needed (For dry  feet). 85 g 1  . mometasone-formoterol (DULERA) 200-5 MCG/ACT AERO Inhale 2 puffs into the lungs 2 (two) times daily. 8.8 g 5  . cloNIDine (CATAPRES) 0.1 MG tablet Take 1 tablet (0.1 mg total) by mouth 2 (two) times daily. (Patient not taking: Reported on 10/29/2020) 60 tablet 0   No facility-administered medications prior to visit.     Review of Systems:   Constitutional:   No  weight loss, night sweats,  Fevers, chills, + fatigue, or  lassitude.  HEENT:   No headaches,  Difficulty swallowing,  Tooth/dental problems, or  Sore throat,                No sneezing, itching, ear ache,  +nasal congestion, post nasal drip,   CV:  No chest pain,  Orthopnea, PND, swelling in lower extremities, anasarca, dizziness, palpitations, syncope.   GI  No heartburn, indigestion, abdominal pain, nausea, vomiting, diarrhea, change in bowel habits, loss of appetite, bloody stools.   Resp:    No chest wall deformity  Skin: no rash or lesions.  GU: no dysuria, change in color of urine, no urgency or frequency.  No flank pain, no hematuria   MS:  No joint pain or swelling.  No decreased range of motion.  No back pain.    Physical Exam  BP 106/80 (BP Location: Left Arm, Patient Position: Sitting, Cuff Size: Large)   Pulse 95   Temp (!) 97.3 F (36.3 C) (Temporal)   Ht _0  (1.651 m)   Wt 253 lb 3.2 oz (114.9 kg)   SpO2 97%   BMI 42.13 kg/m   GEN: A/Ox3; pleasant , NAD, well nourished , BMI +42    HEENT:  [place]/AT,  NOSE-clear, THROAT-clear, no lesions, no postnasal drip or exudate noted.   NECK:  Supple w/ fair ROM; no JVD; normal carotid impulses w/o bruits; no thyromegaly or nodules palpated; no lymphadenopathy.    RESP  Clear  P & A; w/o, wheezes/ rales/ or rhonchi. no accessory muscle use, no dullness to percussion  CARD:  RRR, no m/r/g, no peripheral edema, pulses intact, no cyanosis or clubbing.  GI:   Soft & nt; nml bowel sounds; no organomegaly or masses detected.   Musco: Warm bil,  no deformities or joint swelling noted.   Neuro: alert, no focal deficits noted.    Skin: Warm, no lesions or rashes    Lab Results:  CBC  BMET  Imaging:    PFT Results Latest Ref Rng & Units 03/03/2017  FVC-Pre L 2.36  FVC-Predicted Pre % 77  FVC-Post L 2.45  FVC-Predicted Post % 80  Pre FEV1/FVC % % 58  Post FEV1/FCV % % 64  FEV1-Pre L 1.38  FEV1-Predicted Pre % 56  FEV1-Post L 1.57  DLCO uncorrected ml/min/mmHg 18.00  DLCO UNC% % 68  DLCO corrected ml/min/mmHg 17.74  DLCO COR %Predicted % 67  DLVA Predicted % 98  TLC L 4.47  TLC % Predicted % 84  RV % Predicted % 108    No results found for: NITRICOXIDE      Assessment & Plan:   Uncontrolled persistent asthma Difficult to control allergic asthma.  Patient will need to change Dulera to alternative.  We will switch to [facility] as she has tolerated this in the past. Would like for her to restart the [place] paperwork process.  She had been approved previously but was unable to begin injections due to personal issues.  Flu shot today Asthma action plan  Plan  Patient Instructions  Change Dulera to BREO 1 puff daily , rinse after use.  Continue on Singulair daily Continue on Claritin daily Albuterol as needed for wheezing and shortness of breath Restart Fasenra paperwork.   Restart CPAP at bedtime Goal was to wear for greater than 4 hours each night Do not drive if sleepy  Follow-up with Dr Halford Chessman in 3 months and As needed        OSA (obstructive sleep apnea) Moderate obstructive sleep apnea recommend restarting CPAP.  Patient education given  Plan  Patient Instructions  Change Dulera to BREO 1 puff daily , rinse after use.  Continue on Singulair daily Continue on Claritin daily Albuterol as needed for wheezing and shortness of breath Restart Fasenra paperwork.  Restart CPAP at bedtime Goal was to wear for greater than 4 hours each night Do not drive if sleepy  Follow-up with Dr Halford Chessman in 3  months and As needed           [name], NP 10/29/2020

## 2020-10-30 ENCOUNTER — Telehealth: Payer: Self-pay | Admitting: Pharmacy Technician

## 2020-10-30 NOTE — Telephone Encounter (Signed)
Received fax that plan needed additional clinical documentation that patient has had poor symptom control after a minimum of three months of a high dose corticosteroid inhaler in combination with a long acting beta-agonist.   Faxed additional clinical notes. Will update once we have response.

## 2020-10-30 NOTE — Telephone Encounter (Signed)
Submitted a Prior Authorization request to [facility] for [facility] via Cover My Meds. Will update once we receive a response.  KeyDarnelle Maffucci) - [id number]

## 2020-10-30 NOTE — Progress Notes (Signed)
Reviewed and agree with assessment/plan.   Caran Storck, MD [facility] 10/30/2020, 8:54 AM Pager:  [phone]  

## 2020-10-30 NOTE — Telephone Encounter (Signed)
Received New start paperwork for [facility]. Will update as we work through the benefits process.  Harrington Challenger paperwork was not signed by provider placed in T. Parrett's box for signature.

## 2020-11-03 ENCOUNTER — Other Ambulatory Visit: Payer: Self-pay | Admitting: Pulmonary Disease

## 2020-11-03 DIAGNOSIS — J4551 Severe persistent asthma with (acute) exacerbation: Secondary | ICD-10-CM

## 2020-11-04 NOTE — Telephone Encounter (Signed)
Sent appeal.  Awaiting process

## 2020-11-06 NOTE — Telephone Encounter (Signed)
Received notification from [facility] regarding a prior authorization for [facility]. Authorization has been APPROVED from 10/31/20 to 04/30/21.   Authorization # [id number] Phone # [phone]

## 2020-11-10 NOTE — Telephone Encounter (Signed)
Tammy - she was approved for [employer]. I didn't see recent progress notes indicating whether you found it clinically appropriate to move forward with self-injection. Please advise.  Thanks! Chesley Mires, PharmD, MPH Clinical Pharmacist (Rheumatology and Pulmonology)

## 2020-11-10 NOTE — Telephone Encounter (Addendum)
Patient approved for [employer] and syringes. Copay will be $3.00- patient has Medicaid plan, no copay assistance is needed. Can fill through [facility].

## 2020-11-16 ENCOUNTER — Telehealth: Payer: Self-pay | Admitting: Adult Health

## 2020-11-16 MED ORDER — EPINEPHRINE 0.3 MG/0.3ML IJ SOAJ: 0.3000 mg | INTRAMUSCULAR | 2 refills | Status: DC | PRN

## 2020-11-16 NOTE — Telephone Encounter (Addendum)
Called patient to notify that she has been re-approved for Fasenra injections and schedule new start. Patient has history of no-shows to injections and clinic visits. Patient has Epi-pen at home but unsure if it's expired. She requested that I call back tomorrow prior to scheduling her Harrington Challenger in clinic.   Plan is to continue to use in-office administration until deemed appropriate to switch to self-administration at home by provider.  Addendum: Patient called back. Her EpiPen expired 07/21. [facility] [address]  Discussed that first dose in clinic will be 2 hours of monitoring and that she must bring Epipen with her. She plans to call clinic when she picks up Epipen from pharmacy. Will f/u later this week if I don't hear from her.

## 2020-11-16 NOTE — Telephone Encounter (Signed)
Can it be done in the office and then as she does well than switch to self injections ?

## 2020-11-19 NOTE — Telephone Encounter (Signed)
Nothing noted message. Will close encounter as it was created in error.

## 2020-11-20 ENCOUNTER — Other Ambulatory Visit: Payer: Self-pay | Admitting: Obstetrics and Gynecology

## 2020-11-20 ENCOUNTER — Telehealth: Payer: Self-pay | Admitting: Family Medicine

## 2020-11-20 NOTE — Patient Instructions (Signed)
Hi Ms. Raj, sorry we missed you today. - as a part of your Medicaid benefit, you are eligible for care management and care coordination services at no cost or copay. I was unable to reach you by phone today but would be happy to help you with your health related needs. Please feel free to call me at [phone].  A member of the Managed Medicaid care management team will reach out to you again over the next 7 days.   [name] RN, BSN [place]  [employer], production - Managed Medicaid High Risk [phone].

## 2020-11-20 NOTE — Telephone Encounter (Signed)
Copied from [place] [id number]. Topic: General - Inquiry >> Nov 19, 2020  9:42 AM Greggory Keen D wrote: Reason for CRM: pt called saying she needs a new glucose meter kit.  [facility]  CB# [phone]

## 2020-11-20 NOTE — Patient Outreach (Signed)
Care Coordination  11/20/2020  WILLODENE STALLINGS 07-12-1966 [id number]    Medicaid Managed Care   Unsuccessful Outreach Note  11/20/2020 Name: MELANNY WIRE MRN: [id number] DOB: 06-10-1966  Referred by: Hoy Register, MD Reason for referral : High Risk Managed Medicaid (Unsuccessful telephone outreach)   An unsuccessful telephone outreach was attempted today. The patient was referred to the case management team for assistance with care management and care coordination.   Follow Up Plan: A member of the care management team will reach out to the patient again over the next 7 days.   [name] RN, BSN [place]  [employer] - Managed Medicaid High Risk [phone].

## 2020-11-25 NOTE — Telephone Encounter (Addendum)
Patient plans to pick up Epipen from pharmacy tomorrow. Reports she is not feeling well today and will plan to call clinic once she picks up.  Will schedule Harrington Challenger after she calls clinic

## 2020-11-27 ENCOUNTER — Ambulatory Visit: Payer: Medicaid Other | Admitting: Podiatry

## 2020-12-02 ENCOUNTER — Encounter: Payer: Self-pay | Admitting: Family Medicine

## 2020-12-02 ENCOUNTER — Ambulatory Visit: Payer: Medicaid Other | Attending: Family Medicine | Admitting: Family Medicine

## 2020-12-02 ENCOUNTER — Other Ambulatory Visit: Payer: Self-pay

## 2020-12-02 VITALS — BP 128/75 | HR 80 | Ht 65.0 in | Wt 252.0 lb

## 2020-12-02 DIAGNOSIS — Z131 Encounter for screening for diabetes mellitus: Secondary | ICD-10-CM

## 2020-12-02 DIAGNOSIS — Z1231 Encounter for screening mammogram for malignant neoplasm of breast: Secondary | ICD-10-CM

## 2020-12-02 DIAGNOSIS — B3731 Acute candidiasis of vulva and vagina: Secondary | ICD-10-CM

## 2020-12-02 DIAGNOSIS — M79671 Pain in right foot: Secondary | ICD-10-CM | POA: Diagnosis not present

## 2020-12-02 DIAGNOSIS — Z23 Encounter for immunization: Secondary | ICD-10-CM | POA: Diagnosis not present

## 2020-12-02 DIAGNOSIS — Z1211 Encounter for screening for malignant neoplasm of colon: Secondary | ICD-10-CM | POA: Diagnosis not present

## 2020-12-02 DIAGNOSIS — B373 Candidiasis of vulva and vagina: Secondary | ICD-10-CM | POA: Diagnosis not present

## 2020-12-02 DIAGNOSIS — G629 Polyneuropathy, unspecified: Secondary | ICD-10-CM

## 2020-12-02 DIAGNOSIS — Z1159 Encounter for screening for other viral diseases: Secondary | ICD-10-CM | POA: Diagnosis not present

## 2020-12-02 DIAGNOSIS — Z0000 Encounter for general adult medical examination without abnormal findings: Secondary | ICD-10-CM | POA: Diagnosis not present

## 2020-12-02 MED ORDER — FLUCONAZOLE 150 MG PO TABS: 150.0000 mg | ORAL_TABLET | Freq: Once | ORAL | 3 refills | Status: AC

## 2020-12-02 MED ORDER — METHOCARBAMOL 500 MG PO TABS
500.0000 mg | ORAL_TABLET | Freq: Three times a day (TID) | ORAL | 0 refills | Status: DC | PRN
Start: 2020-12-02 — End: 2021-04-02

## 2020-12-02 MED ORDER — DICLOFENAC SODIUM 1 % EX GEL: 2.0000 g | Freq: Four times a day (QID) | CUTANEOUS | 1 refills | Status: DC

## 2020-12-02 NOTE — Patient Instructions (Signed)
Health Maintenance, Female Adopting a healthy lifestyle and getting preventive care are important in promoting health and wellness. Ask your health care provider about:  The right schedule for you to have regular tests and exams.  Things you can do on your own to prevent diseases and keep yourself healthy. What should I know about diet, weight, and exercise? Eat a healthy diet  Eat a diet that includes plenty of vegetables, fruits, low-fat dairy products, and lean protein.  Do not eat a lot of foods that are high in solid fats, added sugars, or sodium.   Maintain a healthy weight Body mass index (BMI) is used to identify weight problems. It estimates body fat based on height and weight. Your health care provider can help determine your BMI and help you achieve or maintain a healthy weight. Get regular exercise Get regular exercise. This is one of the most important things you can do for your health. Most adults should:  Exercise for at least 150 minutes each week. The exercise should increase your heart rate and make you sweat (moderate-intensity exercise).  Do strengthening exercises at least twice a week. This is in addition to the moderate-intensity exercise.  Spend less time sitting. Even light physical activity can be beneficial. Watch cholesterol and blood lipids Have your blood tested for lipids and cholesterol at 56 years of age, then have this test every 5 years. Have your cholesterol levels checked more often if:  Your lipid or cholesterol levels are high.  You are older than 56 years of age.  You are at high risk for heart disease. What should I know about cancer screening? Depending on your health history and family history, you may need to have cancer screening at various ages. This may include screening for:  Breast cancer.  Cervical cancer.  Colorectal cancer.  Skin cancer.  Lung cancer. What should I know about heart disease, diabetes, and high blood  pressure? Blood pressure and heart disease  High blood pressure causes heart disease and increases the risk of stroke. This is more likely to develop in people who have high blood pressure readings, are of African descent, or are overweight.  Have your blood pressure checked: ? Every 3-5 years if you are 18-39 years of age. ? Every year if you are 40 years old or older. Diabetes Have regular diabetes screenings. This checks your fasting blood sugar level. Have the screening done:  Once every three years after age 40 if you are at a normal weight and have a low risk for diabetes.  More often and at a younger age if you are overweight or have a high risk for diabetes. What should I know about preventing infection? Hepatitis B If you have a higher risk for hepatitis B, you should be screened for this virus. Talk with your health care provider to find out if you are at risk for hepatitis B infection. Hepatitis C Testing is recommended for:  Everyone born from 1945 through 1965.  Anyone with known risk factors for hepatitis C. Sexually transmitted infections (STIs)  Get screened for STIs, including gonorrhea and chlamydia, if: ? You are sexually active and are younger than 56 years of age. ? You are older than 56 years of age and your health care provider tells you that you are at risk for this type of infection. ? Your sexual activity has changed since you were last screened, and you are at increased risk for chlamydia or gonorrhea. Ask your health care provider   if you are at risk.  Ask your health care provider about whether you are at high risk for HIV. Your health care provider may recommend a prescription medicine to help prevent HIV infection. If you choose to take medicine to prevent HIV, you should first get tested for HIV. You should then be tested every 3 months for as long as you are taking the medicine. Pregnancy  If you are about to stop having your period (premenopausal) and  you may become pregnant, seek counseling before you get pregnant.  Take 400 to 800 micrograms (mcg) of folic acid every day if you become pregnant.  Ask for birth control (contraception) if you want to prevent pregnancy. Osteoporosis and menopause Osteoporosis is a disease in which the bones lose minerals and strength with aging. This can result in bone fractures. If you are 65 years old or older, or if you are at risk for osteoporosis and fractures, ask your health care provider if you should:  Be screened for bone loss.  Take a calcium or vitamin D supplement to lower your risk of fractures.  Be given hormone replacement therapy (HRT) to treat symptoms of menopause. Follow these instructions at home: Lifestyle  Do not use any products that contain nicotine or tobacco, such as cigarettes, e-cigarettes, and chewing tobacco. If you need help quitting, ask your health care provider.  Do not use street drugs.  Do not share needles.  Ask your health care provider for help if you need support or information about quitting drugs. Alcohol use  Do not drink alcohol if: ? Your health care provider tells you not to drink. ? You are pregnant, may be pregnant, or are planning to become pregnant.  If you drink alcohol: ? Limit how much you use to 0-1 drink a day. ? Limit intake if you are breastfeeding.  Be aware of how much alcohol is in your drink. In the [place] one drink equals one 12 oz bottle of beer (355 mL), one 5 oz glass of wine (148 mL), or one 1 oz glass of hard liquor (44 mL). General instructions  Schedule regular health, dental, and eye exams.  Stay current with your vaccines.  Tell your health care provider if: ? You often feel depressed. ? You have ever been abused or do not feel safe at home. Summary  Adopting a healthy lifestyle and getting preventive care are important in promoting health and wellness.  Follow your health care provider's instructions about healthy  diet, exercising, and getting tested or screened for diseases.  Follow your health care provider's instructions on monitoring your cholesterol and blood pressure. This information is not intended to replace advice given to you by your health care provider. Make sure you discuss any questions you have with your health care provider. Document Revised: 10/03/2018 Document Reviewed: 10/03/2018 Elsevier Patient Education  2021 Elsevier Inc.  

## 2020-12-02 NOTE — Progress Notes (Signed)
Subjective:  Patient ID: [name], female    DOB: 19-May-1966  Age: 55 y.o. MRN: [id number]  CC: Annual Exam   HPI Kathleen Rodriguez is a 55 year old female with a history of asthma, obstructive sleep apnea (on CPAP), bilateral knee osteoarthritis (status post left total knee arthroplasty in 08/2016)seen today for an annual physical exam She complains of pain on the dorsum of her right foot ever since she took a fall.  Also complains of recurrent vaginal candidiasis with a lot of vaginal itching as she has had to be on prednisone frequently for management of asthma. Past Medical History:  Diagnosis Date  . Arthritis    "knees, lower back; legs, ankles" (01/27/2016)  . Asthma    followed by Dr. Halford Chessman  . CHF (congestive heart failure) ([place]) 2016   "when I went into a coma"  . Cocaine abuse ([place])   . 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 Diabetes mellitus without complication ([place])   . Dyspnea   . GERD (gastroesophageal reflux disease)   . Hypertension    "doctor took me off RX in 2016" (01/27/2016)  . Influenza B April 9417   Complicated by multi-organ failure  . OSA on CPAP "since " 03/20/2013  . Pneumonia 2016  . Required emergent intubation    asthma exacerbation in 2016  . Tobacco abuse   . Upper airway cough syndrome     Past Surgical History:  Procedure Laterality Date  . [place]   as a teenager , cyst was benign  . CESAREAN SECTION  2006  . LACERATION REPAIR Right ~ 1997   "tried to cut myself"  . TOTAL KNEE ARTHROPLASTY Left 09/01/2016   Procedure: LEFT TOTAL KNEE ARTHROPLASTY;  Surgeon: Leandrew Koyanagi, MD;  Location: [facility];  Service: Orthopedics;  Laterality: Left;  . TUBAL LIGATION  2006    Family History  Problem Relation Age of Onset  . Hypertension Mother   . HIV/AIDS Father     Allergies  Allergen Reactions  . Tomato Hives, Itching and Other (See Comments)    ALSO REACTS TO KETCHUP  . Latex Itching and Rash  . Wool Alcohol  [Lanolin] Itching    Outpatient Medications Prior to Visit  Medication Sig Dispense Refill  . acetaminophen (TYLENOL) 500 MG tablet Take 500 mg by mouth every 6 (six) hours as needed for mild pain.    Marland Kitchen albuterol (PROVENTIL) (2.5 MG/3ML) 0.083% nebulizer solution Take 3 mLs (2.5 mg total) by nebulization every 6 (six) hours as needed for wheezing or shortness of breath. 360 mL 0  . aspirin 81 MG EC tablet Take 1 tablet (81 mg total) by mouth daily. 30 tablet 3  . Blood Glucose Monitoring Suppl (ACCU-CHEK GUIDE ME) w/Device KIT 1 each by Does not apply route daily. Dx; Prediabetets; Prednisone use 1 kit 0  . cloNIDine (CATAPRES) 0.1 MG tablet Take 1 tablet (0.1 mg total) by mouth 2 (two) times daily. Must keep office visit tomorrow for additional refills. (Patient taking differently: Take 0.2 mg by mouth 2 (two) times daily. Must keep office visit tomorrow for additional refills.) 60 tablet 0  . EPINEPHrine 0.3 mg/0.3 mL IJ SOAJ injection Inject 0.3 mg into the muscle as needed for anaphylaxis. 1 each 2  . fluticasone (FLONASE) 50 MCG/ACT nasal spray Place 1 spray into both nostrils daily. (Patient taking differently: Place 1 spray into both nostrils daily as needed for allergies.) 16 g 5  . fluticasone furoate-vilanterol (  BREO ELLIPTA) 200-25 MCG/INH AEPB Inhale 1 puff into the lungs daily. 1 each 5  . gabapentin (NEURONTIN) 300 MG capsule Take 2 capsules (600 mg total) by mouth 2 (two) times daily. 120 capsule 3  . glucose blood (ACCU-CHEK GUIDE) test strip Use as instructed daily before breakfast. Dx; Prediabetets; Prednisone use 30 each 12  . ipratropium (ATROVENT) 0.02 % nebulizer solution Take 2.5 mLs (0.5 mg total) by nebulization 2 (two) times daily as needed for wheezing or shortness of breath. 360 mL 5  . loratadine (CLARITIN) 10 MG tablet Take 1 tablet (10 mg total) by mouth daily. 30 tablet 11  . meloxicam (MOBIC) 7.5 MG tablet Take 1 tablet (7.5 mg total) by mouth daily. 30 tablet 1  .  Menthol (HALLS COUGH DROPS MT) Use as directed 1 drop in the mouth or throat 2 (two) times daily as needed (sore throat and cough).    . metFORMIN (GLUCOPHAGE) 500 MG tablet Take 1 tablet (500 mg total) by mouth 2 (two) times daily with a meal. 180 tablet 0  . Misc. Devices MISC Blood pressure monitor  Dx: Hypertension 1 each 0  . montelukast (SINGULAIR) 10 MG tablet Take 1 tablet (10 mg total) by mouth at bedtime. 90 tablet 1  . pantoprazole (PROTONIX) 40 MG tablet Take 1 tablet (40 mg total) by mouth daily at 12 noon. 30 tablet 0  . PROAIR HFA 108 (90 Base) MCG/ACT inhaler TAKE 2 PUFFS BY MOUTH EVERY 6 HOURS AS NEEDED FOR WHEEZE OR SHORTNESS OF BREATH 8.5 each 3  . urea (CARMOL) 20 % cream Apply 1 application topically daily as needed (For dry feet). 85 g 1  . methocarbamol (ROBAXIN) 500 MG tablet TAKE 1 TABLET (500 MG TOTAL) BY MOUTH EVERY 8 (EIGHT) HOURS AS NEEDED FOR MUSCLE SPASMS. (Patient not taking: Reported on 12/02/2020) 60 tablet 0  . predniSONE (DELTASONE) 20 MG tablet Take 2 tablets daily with breakfast. (Patient not taking: Reported on 12/02/2020) 10 tablet 0   No facility-administered medications prior to visit.     ROS Review of Systems  Constitutional: Negative for activity change, appetite change and fatigue.  HENT: Negative for congestion, sinus pressure and sore throat.   Eyes: Negative for visual disturbance.  Respiratory: Negative for cough, chest tightness, shortness of breath and wheezing.   Cardiovascular: Negative for chest pain and palpitations.  Gastrointestinal: Negative for abdominal distention, abdominal pain and constipation.  Endocrine: Negative for polydipsia.  Genitourinary: Negative for dysuria and frequency.  Musculoskeletal:       See HPI  Skin: Negative for rash.  Neurological: Negative for tremors, light-headedness and numbness.  Hematological: Does not bruise/bleed easily.  Psychiatric/Behavioral: Negative for agitation and behavioral problems.     Objective:  BP 128/75   Pulse 80   Ht '5\' 5"'  (1.651 m)   Wt 252 lb (114.3 kg)   SpO2 98%   BMI 41.93 kg/m   BP/Weight 12/02/2020 10/29/2020 24/06/7352  Systolic BP 299 242 683  Diastolic BP 75 80 77  Wt. (Lbs) 252 253.2 -  BMI 41.93 42.13 -      Physical Exam Constitutional:      General: She is not in acute distress.    Appearance: She is well-developed and well-nourished. She is not diaphoretic.  HENT:     Head: Normocephalic.     Right Ear: External ear normal.     Left Ear: External ear normal.     Nose: Nose normal.     Mouth/Throat:  Mouth: Oropharynx is clear and moist.  Eyes:     Extraocular Movements: EOM normal.     Conjunctiva/sclera: Conjunctivae normal.     Pupils: Pupils are equal, round, and reactive to light.  Neck:     Vascular: No JVD.  Cardiovascular:     Rate and Rhythm: Normal rate and regular rhythm.     Pulses: Intact distal pulses.     Heart sounds: Normal heart sounds. No murmur heard. No gallop.   Pulmonary:     Effort: Pulmonary effort is normal. No respiratory distress.     Breath sounds: Normal breath sounds. No wheezing or rales.  Chest:     Chest wall: No tenderness.  Breasts:     Right: No mass, nipple discharge, skin change, tenderness, axillary adenopathy or supraclavicular adenopathy.     Left: No mass, nipple discharge, skin change, tenderness, axillary adenopathy or supraclavicular adenopathy.    Abdominal:     General: Bowel sounds are normal. There is no distension.     Palpations: Abdomen is soft. There is no mass.     Tenderness: There is no abdominal tenderness.  Musculoskeletal:        General: Swelling (minimal edema of dorsum of R foot) and tenderness (sligt TTP of midpoint of R dorsum of foot) present. No edema. Normal range of motion.     Cervical back: Normal range of motion.  Lymphadenopathy:     Upper Body:     Right upper body: No supraclavicular, axillary or pectoral adenopathy.     Left upper body: No  supraclavicular, axillary or pectoral adenopathy.  Skin:    General: Skin is warm and dry.  Neurological:     Mental Status: She is alert and oriented to person, place, and time.     Deep Tendon Reflexes: Reflexes are normal and symmetric.  Psychiatric:        Mood and Affect: Mood and affect normal.     CMP Latest Ref Rng & Units 07/06/2020 07/05/2020 07/04/2020  Glucose 70 - 99 mg/dL 147(H) 117(H) 112(H)  BUN 6 - 20 mg/dL '16 16 14  ' Creatinine 0.44 - 1.00 mg/dL 0.97 0.91 0.89  Sodium 135 - 145 mmol/L 138 140 140  Potassium 3.5 - 5.1 mmol/L 4.7 4.7 5.1  Chloride 98 - 111 mmol/L 101 104 103  CO2 22 - 32 mmol/L '26 29 26  ' Calcium 8.9 - 10.3 mg/dL 9.1 9.2 9.2  Total Protein 6.0 - 8.5 g/dL - - -  Total Bilirubin 0.0 - 1.2 mg/dL - - -  Alkaline Phos 39 - 117 IU/L - - -  AST 0 - 40 IU/L - - -  ALT 0 - 32 IU/L - - -    Lipid Panel     Component Value Date/Time   CHOL 132 12/25/2018 0650   CHOL 172 11/07/2018 1041   TRIG 17 12/25/2018 0650   HDL 60 12/25/2018 0650   HDL 56 11/07/2018 1041   CHOLHDL 2.2 12/25/2018 0650   VLDL 3 12/25/2018 0650   LDLCALC 69 12/25/2018 0650   LDLCALC 98 11/07/2018 1041    CBC    Component Value Date/Time   WBC 11.6 (H) 07/06/2020 0459   RBC 5.26 (H) 07/06/2020 0459   HGB 14.5 07/06/2020 0459   HGB 13.3 11/07/2018 1041   HCT 45.5 07/06/2020 0459   HCT 42.2 11/07/2018 1041   PLT 316 07/06/2020 0459   PLT 347 11/07/2018 1041   MCV 86.5 07/06/2020 0459   MCV  87 11/07/2018 1041   MCH 27.6 07/06/2020 0459   MCHC 31.9 07/06/2020 0459   RDW 15.9 (H) 07/06/2020 0459   RDW 15.2 11/07/2018 1041   LYMPHSABS 3.5 07/02/2020 2022   LYMPHSABS 2.6 11/07/2018 1041   MONOABS 0.7 07/02/2020 2022   EOSABS 0.6 (H) 07/02/2020 2022   EOSABS 0.3 11/07/2018 1041   BASOSABS 0.1 07/02/2020 2022   BASOSABS 0.1 11/07/2018 1041    Lab Results  Component Value Date   HGBA1C 5.8 (H) 07/03/2020    Assessment & Plan:  1. Annual physical exam Counseled on 150  minutes of exercise per week, healthy eating (including decreased daily intake of saturated fats, cholesterol, added sugars, sodium), routine healthcare maintenance. Pap smear not due till 12/2021 - CMP14+EGFR - Lipid panel - CBC with Differential/Platelet  2. Encounter for screening mammogram for malignant neoplasm of breast - MM DIGITAL SCREENING BILATERAL; Future  3. Screening for colon cancer - Ambulatory referral to Gastroenterology  4. Screening for diabetes mellitus -A1c  5. Need for hepatitis C screening test - HCV RNA quant rflx ultra or genotyp([facility])  6. Vaginal candidiasis Due to recurrent prednisone use Diflucan ordered  7. Right foot pain Secondary to trauma Voltaren gel ordered   Meds ordered this encounter  Medications  . fluconazole (DIFLUCAN) 150 MG tablet    Sig: Take 1 tablet (150 mg total) by mouth once for 1 dose. Then repeat in 72 hours    Dispense:  1 tablet    Refill:  3  . diclofenac Sodium (VOLTAREN) 1 % GEL    Sig: Apply 2 g topically 4 (four) times daily.    Dispense:  100 g    Refill:  1    Follow-up: Return in about 6 months (around 06/01/2021) for Coordination of care.       [name], MD, FAAFP. [facility] [place], [place]   12/02/2020, 11:27 AM

## 2020-12-03 ENCOUNTER — Telehealth: Payer: Self-pay | Admitting: Pulmonary Disease

## 2020-12-03 LAB — HCV RNA QUANT RFLX ULTRA OR GENOTYP: HCV Quant Baseline: NOT DETECTED IU/mL

## 2020-12-03 LAB — CMP14+EGFR
ALT: 10 IU/L (ref 0–32)
AST: 17 IU/L (ref 0–40)
Albumin/Globulin Ratio: 1.4 (ref 1.2–2.2)
Albumin: 4.3 g/dL (ref 3.8–4.9)
Alkaline Phosphatase: 76 IU/L (ref 44–121)
BUN/Creatinine Ratio: 16 (ref 9–23)
BUN: 14 mg/dL (ref 6–24)
Bilirubin Total: 0.3 mg/dL (ref 0.0–1.2)
CO2: 22 mmol/L (ref 20–29)
Calcium: 9.2 mg/dL (ref 8.7–10.2)
Chloride: 101 mmol/L (ref 96–106)
Creatinine, Ser: 0.89 mg/dL (ref 0.57–1.00)
GFR calc Af Amer: 84 mL/min/{1.73_m2} (ref >59)
GFR calc non Af Amer: 73 mL/min/{1.73_m2} (ref >59)
Globulin, Total: 3 g/dL (ref 1.5–4.5)
Glucose: 90 mg/dL (ref 65–99)
Potassium: 4.7 mmol/L (ref 3.5–5.2)
Sodium: 141 mmol/L (ref 134–144)
Total Protein: 7.3 g/dL (ref 6.0–8.5)

## 2020-12-03 LAB — CBC WITH DIFFERENTIAL/PLATELET
Basophils Absolute: 0.1 10*3/uL (ref 0.0–0.2)
Basos: 1 %
EOS (ABSOLUTE): 0.4 10*3/uL (ref 0.0–0.4)
Eos: 7 %
Hematocrit: 46.9 % — ABNORMAL HIGH (ref 34.0–46.6)
Hemoglobin: 14.8 g/dL (ref 11.1–15.9)
Immature Grans (Abs): 0 10*3/uL (ref 0.0–0.1)
Immature Granulocytes: 0 %
Lymphocytes Absolute: 2.2 10*3/uL (ref 0.7–3.1)
Lymphs: 43 %
MCH: 27.6 pg (ref 26.6–33.0)
MCHC: 31.6 g/dL (ref 31.5–35.7)
MCV: 87 fL (ref 79–97)
Monocytes Absolute: 0.5 10*3/uL (ref 0.1–0.9)
Monocytes: 10 %
Neutrophils Absolute: 2 10*3/uL (ref 1.4–7.0)
Neutrophils: 39 %
Platelets: 318 10*3/uL (ref 150–450)
RBC: 5.37 x10E6/uL — ABNORMAL HIGH (ref 3.77–5.28)
RDW: 13.6 % (ref 11.7–15.4)
WBC: 5.1 10*3/uL (ref 3.4–10.8)

## 2020-12-03 LAB — LIPID PANEL
Chol/HDL Ratio: 3.2 ratio (ref 0.0–4.4)
Cholesterol, Total: 172 mg/dL (ref 100–199)
HDL: 53 mg/dL (ref >39)
LDL Chol Calc (NIH): 99 mg/dL (ref 0–99)
Triglycerides: 111 mg/dL (ref 0–149)
VLDL Cholesterol Cal: 20 mg/dL (ref 5–40)

## 2020-12-03 NOTE — Telephone Encounter (Signed)
LMTCB.   Letter printed and placed in TP box to have signed and mailed to patient.

## 2020-12-03 NOTE — Telephone Encounter (Signed)
We can send a letter that we follow her for severe persistent asthma and obstructive sleep apnea which are both chronic medical illnesses.  Both of these impact her quality of life and she is prone to frequent asthma exacerbations.

## 2020-12-03 NOTE — Telephone Encounter (Signed)
Called and spoke with Patient.  Patient stated she is trying to get a washer and drier from a furniture store. Patient stated she is receiving financial assistance and needs a letter from a provider stating she has health issues, with asthma. Patient was last seen in office by Tammy,NP 10/29/20.  Message routed to [facility], NP to advise

## 2020-12-04 LAB — HEMOGLOBIN A1C
Est. average glucose Bld gHb Est-mCnc: 114 mg/dL
Hgb A1c MFr Bld: 5.6 % (ref 4.8–5.6)

## 2020-12-04 LAB — SPECIMEN STATUS REPORT

## 2020-12-07 ENCOUNTER — Telehealth: Payer: Self-pay | Admitting: Pulmonary Disease

## 2020-12-08 NOTE — Telephone Encounter (Signed)
Heather, please advise if this was signed by TP and mailed yet to the pt.

## 2020-12-09 NOTE — Telephone Encounter (Signed)
Informed patient letter signed and put in mail.

## 2020-12-09 NOTE — Telephone Encounter (Signed)
Will route message to injection pool 

## 2020-12-09 NOTE — Telephone Encounter (Signed)
Letter signed.  Placed in mail to patient today.  Left VM on home phone per DPR letting patient know that the letter from Jeanmarie Plant NP is ready.  Advised to call the office with any questions.

## 2020-12-10 ENCOUNTER — Telehealth: Payer: Self-pay | Admitting: *Deleted

## 2020-12-10 ENCOUNTER — Telehealth: Payer: Self-pay

## 2020-12-10 DIAGNOSIS — G629 Polyneuropathy, unspecified: Secondary | ICD-10-CM

## 2020-12-10 NOTE — Telephone Encounter (Signed)
Patient was called and a voicemail was left informing patient to return phone call for lab results. Normal result letter has been mailed to pt.

## 2020-12-10 NOTE — Telephone Encounter (Signed)
Reviewed lab results and physician's note with the patient. Other subjects mentioned by the patient-will submit in other encounter and route to the office.

## 2020-12-10 NOTE — Telephone Encounter (Signed)
ATC Patient.  LMTCB. 

## 2020-12-10 NOTE — Telephone Encounter (Signed)
Lab results were reviewed with the patient.  In the same call she had two requests: 1)Her feet are very painful early in the morning and late at night. Requesting for increase of Gabapentin. She is on 300 mg capsules, takes 2 capsules twice/day.  She has upcoming appointment for diabetic foot orthotic fitting shoes.  2)She is requesting "diet pills". I did explain she would probably need an appointment to discuss the subject. Please advise.

## 2020-12-10 NOTE — Telephone Encounter (Signed)
-----   Message from Hoy Register, MD sent at 12/04/2020 12:00 PM EST ----- Please inform the patient that labs are normal. Thank you.

## 2020-12-11 MED ORDER — GABAPENTIN 300 MG PO CAPS: 600.0000 mg | ORAL_CAPSULE | Freq: Three times a day (TID) | ORAL | 3 refills | Status: DC

## 2020-12-11 NOTE — Telephone Encounter (Signed)
Called and spoke with Patient.  Patient scheduled 12/28/20 at 0930 for first Fasenra injection. Patient stated she has Epipen and is aware she needs to bring it to all injection appointments.  Office protocol reviewed.  Patient is interested in doing self administration.  Patient feels that would help with compliance. Patient has Fasenra prefilled syringe in office. Spoke with Patient about meeting with pharmacy after first injection to assist with self training. Patient will need a prefilled pen for self administration sent to specialty pharmacy for future injections.  Message routed to Dr. Craige Cotta and pharmacy team to advise on self training

## 2020-12-11 NOTE — Addendum Note (Signed)
Addended byHoy Register on: 12/11/2020 01:58 PM   Modules accepted: Orders

## 2020-12-11 NOTE — Telephone Encounter (Signed)
Gabapentin dose increased.

## 2020-12-11 NOTE — Telephone Encounter (Signed)
I am okay with her getting trained for home fasenra administration.

## 2020-12-11 NOTE — Telephone Encounter (Signed)
Pt is requesting increase in gabapentin medication due to foot pain.  I will reach out to pt to set an appointment for weight loss medication.

## 2020-12-14 ENCOUNTER — Encounter: Payer: Medicaid Other | Admitting: Family Medicine

## 2020-12-14 ENCOUNTER — Ambulatory Visit: Payer: Medicaid Other | Admitting: Family Medicine

## 2020-12-14 NOTE — Telephone Encounter (Signed)
Scheduled 12/28/20 for Fasenra new start. Will receive PFS in clinic and receive dose #2 with pharmacist at Week 4 using Fasenra pen.

## 2020-12-14 NOTE — Telephone Encounter (Addendum)
She can be added to my schedule for Fasenra dose #2 (expected to be 01/25/21 as long as there are no changes to her initial in-office admin). AFTER she receives first dose, please send Rx for pen auto-injector with a note to pharmacy to deliver med to the office. I can coordinate shipment of med to the clinic thereafter  Will route to Rachael so we can order Fasenra pen samples if needed.  Chesley Mires, PharmD, MPH Clinical Pharmacist (Rheumatology and Pulmonology)

## 2020-12-15 ENCOUNTER — Telehealth: Payer: Self-pay | Admitting: Podiatrist

## 2020-12-15 NOTE — Telephone Encounter (Signed)
spoke to pt about her appt with Raiford Noble tomorrow and explained that we are not able to get pts diabetic shoes/inserts thru our office due to insurance. I explained that Dr Irving Shows can giver her a rx to [facility] at her appt tomorrow.

## 2020-12-16 ENCOUNTER — Other Ambulatory Visit: Payer: Medicaid Other | Admitting: Orthotics

## 2020-12-16 ENCOUNTER — Ambulatory Visit (INDEPENDENT_AMBULATORY_CARE_PROVIDER_SITE_OTHER): Payer: Medicaid Other | Admitting: Podiatrist

## 2020-12-16 ENCOUNTER — Other Ambulatory Visit: Payer: Self-pay

## 2020-12-16 DIAGNOSIS — R234 Changes in skin texture: Secondary | ICD-10-CM | POA: Diagnosis not present

## 2020-12-16 DIAGNOSIS — M2142 Flat foot [pes planus] (acquired), left foot: Secondary | ICD-10-CM

## 2020-12-16 DIAGNOSIS — E0843 Diabetes mellitus due to underlying condition with diabetic autonomic (poly)neuropathy: Secondary | ICD-10-CM

## 2020-12-16 DIAGNOSIS — M2141 Flat foot [pes planus] (acquired), right foot: Secondary | ICD-10-CM | POA: Diagnosis not present

## 2020-12-16 DIAGNOSIS — M24573 Contracture, unspecified ankle: Secondary | ICD-10-CM

## 2020-12-16 NOTE — Patient Instructions (Addendum)
[facility] has :  "toeless aloe moisturizing heel socks"-  You can find them online at [employer] and they may be at stores.  Sleep n heel silicone heel sleeves are also good and sold on [employer] a moisturizer called Amlactin-  It has lactic acid.-    flexitol heel balm is also a good one to try if you haven't yet.

## 2020-12-21 ENCOUNTER — Encounter: Payer: Self-pay | Admitting: Podiatrist

## 2020-12-21 NOTE — Progress Notes (Addendum)
Pes planus,  diates with neuropathy   Chief Complaint  Patient presents with  . Skin Problem    BL bottom heels skin cracking and dry skin x 7 mo; 9/10 burning, itching -per pt skin opens and with bleeding. Tx: bandiad  . Diabetes    FBS: 90 x 1 day A1C: 5.6 PCP: [place] x Feb-8     HPI: Patient is 55 y.o. female who presents today for the concerns as listed above. She has deep cracks in her heels which are severely painful when they open up.  She also states she is diabetic and in need of some new diabetic shoes.    Patient Active Problem List   Diagnosis Date Noted  . Toe fracture, left 06/22/2020  . Prediabetes 10/30/2019  . Hyperglycemia 10/10/2019  . Acute asthma exacerbation 10/09/2019  . COPD mixed type ([place]) 07/04/2019  . Oral thrush 07/04/2019  . Medication management 07/04/2019  . Chest pain 12/25/2018  . Acute-on-chronic respiratory failure ([place]) 12/24/2018  . Skin ulcer ([place]) secondary to bullous impetigo Marilynn Rail 08/29/2018  . Asthma 10/22/2017  . Osteoarthritis of left knee 09/01/2016  . 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 Tobacco abuse   . Primary osteoarthritis of left knee 03/09/2016  . Non compliance with medical treatment 08/19/2015  . Anemia, iron deficiency 08/12/2015  . Essential hypertension 07/25/2015  . COPD exacerbation ([place]) 12/28/2014  . Dysfunctional uterine bleeding 12/28/2014  . Anemia 12/28/2014  . Morbid obesity ([place]) 05/16/2014  . Chronic cough 05/01/2014  . Upper airway cough syndrome 05/01/2014  . GERD (gastroesophageal reflux disease) 04/08/2014  . Asthma exacerbation 04/06/2014  . OSA (obstructive sleep apnea) 03/20/2013  . Uncontrolled persistent asthma 02/05/2013    Current Outpatient Medications on File Prior to Visit  Medication Sig Dispense Refill  . acetaminophen (TYLENOL) 500 MG tablet Take 500 mg by mouth every 6 (six) hours as needed for mild pain.    Marland Kitchen albuterol  (PROVENTIL) (2.5 MG/3ML) 0.083% nebulizer solution Take 3 mLs (2.5 mg total) by nebulization every 6 (six) hours as needed for wheezing or shortness of breath. 360 mL 0  . aspirin 81 MG EC tablet Take 1 tablet (81 mg total) by mouth daily. 30 tablet 3  . Blood Glucose Monitoring Suppl (ACCU-CHEK GUIDE ME) w/Device KIT 1 each by Does not apply route daily. Dx; Prediabetets; Prednisone use 1 kit 0  . cloNIDine (CATAPRES) 0.1 MG tablet Take 1 tablet (0.1 mg total) by mouth 2 (two) times daily. Must keep office visit tomorrow for additional refills. (Patient taking differently: Take 0.2 mg by mouth 2 (two) times daily. Must keep office visit tomorrow for additional refills.) 60 tablet 0  . diclofenac Sodium (VOLTAREN) 1 % GEL Apply 2 g topically 4 (four) times daily. 100 g 1  . EPINEPHrine 0.3 mg/0.3 mL IJ SOAJ injection Inject 0.3 mg into the muscle as needed for anaphylaxis. 1 each 2  . fluticasone (FLONASE) 50 MCG/ACT nasal spray Place 1 spray into both nostrils daily. (Patient taking differently: Place 1 spray into both nostrils daily as needed for allergies.) 16 g 5  . fluticasone furoate-vilanterol (BREO ELLIPTA) 200-25 MCG/INH AEPB Inhale 1 puff into the lungs daily. 1 each 5  . gabapentin (NEURONTIN) 300 MG capsule Take 2 capsules (600 mg total) by mouth 3 (three) times daily. 180 capsule 3  . glucose blood (ACCU-CHEK GUIDE) test strip Use as instructed daily before breakfast. Dx; Prediabetets; Prednisone use 30 each 12  .  ipratropium (ATROVENT) 0.02 % nebulizer solution Take 2.5 mLs (0.5 mg total) by nebulization 2 (two) times daily as needed for wheezing or shortness of breath. 360 mL 5  . loratadine (CLARITIN) 10 MG tablet Take 1 tablet (10 mg total) by mouth daily. 30 tablet 11  . meloxicam (MOBIC) 7.5 MG tablet Take 1 tablet (7.5 mg total) by mouth daily. 30 tablet 1  . Menthol (HALLS COUGH DROPS MT) Use as directed 1 drop in the mouth or throat 2 (two) times daily as needed (sore throat and  cough).    . metFORMIN (GLUCOPHAGE) 500 MG tablet Take 1 tablet (500 mg total) by mouth 2 (two) times daily with a meal. 180 tablet 0  . methocarbamol (ROBAXIN) 500 MG tablet Take 1 tablet (500 mg total) by mouth every 8 (eight) hours as needed for muscle spasms. 60 tablet 0  . Misc. Devices MISC Blood pressure monitor  Dx: Hypertension 1 each 0  . montelukast (SINGULAIR) 10 MG tablet Take 1 tablet (10 mg total) by mouth at bedtime. 90 tablet 1  . pantoprazole (PROTONIX) 40 MG tablet Take 1 tablet (40 mg total) by mouth daily at 12 noon. 30 tablet 0  . predniSONE (DELTASONE) 20 MG tablet Take 2 tablets daily with breakfast. (Patient not taking: Reported on 12/02/2020) 10 tablet 0  . PROAIR HFA 108 (90 Base) MCG/ACT inhaler TAKE 2 PUFFS BY MOUTH EVERY 6 HOURS AS NEEDED FOR WHEEZE OR SHORTNESS OF BREATH 8.5 each 3  . urea (CARMOL) 20 % cream Apply 1 application topically daily as needed (For dry feet). 85 g 1   No current facility-administered medications on file prior to visit.    Allergies  Allergen Reactions  . Tomato Hives, Itching and Other (See Comments)    ALSO REACTS TO KETCHUP  . Latex Itching and Rash  . Wool Alcohol [Lanolin] Itching    Review of Systems No fevers, chills, nausea, muscle aches, no difficulty breathing, no calf pain, no chest pain or shortness of breath.   Physical Exam  GENERAL APPEARANCE: Alert, conversant. Appropriately groomed. No acute distress.   VASCULAR: Pedal pulses palpable DP and PT bilateral.  Capillary refill time is immediate to all digits,  Proximal to distal cooling it warm to warm.  Digital perfusion adequate.   NEUROLOGIC: sensation is diminished to 5.07 monofilament at 0/5 sites bilateral. Light touch and vibratory sensation are decreased.  Subjective numbess and tingling present.   MUSCULOSKELETAL: acceptable muscle strength, tone and stability bilateral. Pes planus foot type is seen.  Some decrease in ankle joint rom is noted bilateral.    DERMATOLOGIC: skin is warm, supple, and dry.  Bilateral lateral heels have deep fissures and cracking.  No sign of infection noted.  No redness or streaking noted.  Pain with pressure is present.     Assessment     ICD-10-CM   1. Diabetes mellitus due to underlying condition with diabetic autonomic neuropathy, unspecified whether long term insulin use (HCC)  E08.43   2. Fissure in skin of both feet  R23.4   3. Pes planus of both feet  M21.41    M21.42   4. Equinus contracture of ankle  M24.573     Plan - careful debridement of fissures was carried out with a 15 blade without complication.  Silvadene cream applied - patient was instructed on use of flexitol heel balm to try in addition to the urea lotion. - patient was instructed to purchase silocone socks [facility] to  sleep in at night - rx written for diabetic shoes and inserts as she will have to use [facility] to obtain these. - she is to watch for any signs of infection and call immediately if any arise.  -she will return in 6 weeks for follow up of the skin fissures.

## 2020-12-21 NOTE — Telephone Encounter (Signed)
Noted  

## 2020-12-24 ENCOUNTER — Telehealth: Payer: Self-pay | Admitting: Podiatrist

## 2020-12-24 ENCOUNTER — Other Ambulatory Visit (INDEPENDENT_AMBULATORY_CARE_PROVIDER_SITE_OTHER): Payer: Medicaid Other | Admitting: Podiatrist

## 2020-12-24 MED ORDER — CEPHALEXIN 500 MG PO CAPS: 500.0000 mg | ORAL_CAPSULE | Freq: Three times a day (TID) | ORAL | 0 refills | Status: DC

## 2020-12-24 NOTE — Progress Notes (Signed)
rx for keflex 500mg  to take 3 times a day called in to her pharmacy.    I called and spoke to the patient- I instructed her to apply antibiotic cream or ointment to the areas of concern.  I also offered her an appointment to be seen today and she states she does not have transportation available and needs 2 days to get that set up. Will ask to set up an appointment for her next week.   I instructed her to elevate and stay off her feet in the meantime.   She will get the antibiotic and start taking it today.

## 2020-12-24 NOTE — Telephone Encounter (Signed)
Pt is requesting an antibiotic, she had a small slit and  it has gotten bigger. She states she can barely walk. She states she is unable to e-mail or use might chart to send picture. Please Advise

## 2020-12-24 NOTE — Telephone Encounter (Signed)
Hi Kathleen Rodriguez-  I called In the antibiotic and spoke with the patient and offered her to come in -.  She can't come in this week but could come in next week.  Would you mind setting her up to see someone early next week if anyone has room on their schedules?    Thanks!!!

## 2020-12-24 NOTE — Telephone Encounter (Signed)
Sure thing!

## 2020-12-28 ENCOUNTER — Ambulatory Visit: Payer: Medicaid Other

## 2020-12-28 ENCOUNTER — Telehealth: Payer: Self-pay | Admitting: Pulmonary Disease

## 2020-12-28 NOTE — Telephone Encounter (Signed)
Patient was scheduled 12/28/20 for Fasenra start. Patient was a no show for injection appointment. Patient has been scheduled several times and not showed for appointment. Patient had requested self injection training and Devki, Pharmacist  was willing to work with Patient after the first injection appointment. Due to injection/infusion transition, if  Patient would like to be scheduled for Fasenra injection in the future, she will have to be scheduled with pharmacy or infusion clinic to start.

## 2020-12-29 ENCOUNTER — Ambulatory Visit: Payer: Medicaid Other | Admitting: Podiatry

## 2021-01-04 ENCOUNTER — Telehealth: Payer: Self-pay | Admitting: Pulmonary Disease

## 2021-01-04 MED ORDER — FLUTICASONE PROPIONATE 50 MCG/ACT NA SUSP: 1.0000 | Freq: Every day | NASAL | 11 refills | Status: DC | PRN

## 2021-01-04 MED ORDER — PREDNISONE 10 MG PO TABS: ORAL_TABLET | ORAL | 0 refills | Status: DC

## 2021-01-04 NOTE — Telephone Encounter (Signed)
Can send script for prednisone 10 mg pill >> 4 pills daily for 2 days, 3 pills daily for 2 days, 2 pills daily for 2 days, 1 pill daily for 2 days   She needs to also get scheduled for next available ROV with me.

## 2021-01-04 NOTE — Telephone Encounter (Signed)
Spoke with the pt and notified of response per Dr Craige Cotta  She verbalized understanding  Rx for pred was sent to pharm  I have scheduled her for rov 01/27/21

## 2021-01-04 NOTE — Telephone Encounter (Signed)
Spoke with the pt  She is c/o increased SOB, wheezing and cough with clear sputum x 2 days  She denies any f/c/s, aches, sore throat, HA  She is still taking her Breo, singulair, claritin and has been using her albuterol inhaler 2 x daily on average  She is requesting pred and refill on flonase  I went ahead and refilled flonase  Please advise, thanks!

## 2021-01-07 ENCOUNTER — Telehealth: Payer: Self-pay | Admitting: Family Medicine

## 2021-01-07 NOTE — Telephone Encounter (Signed)
Copied from CRM [id number]. Topic: General - Other >> Jan 05, 2021 12:08 PM Gwenlyn Fudge wrote: Reason for CRM: Pt called and is requesting to have diabetic shoes ordered for her. She states that she contacted [facility] and they are not covered by pts insurance. Please advise.

## 2021-01-07 NOTE — Telephone Encounter (Signed)
Pt was called and informed that she would have to pay out of pocket for shoes. I informed her that [facility]

## 2021-01-09 ENCOUNTER — Other Ambulatory Visit: Payer: Self-pay | Admitting: Family Medicine

## 2021-01-09 DIAGNOSIS — R7303 Prediabetes: Secondary | ICD-10-CM

## 2021-01-20 ENCOUNTER — Other Ambulatory Visit: Payer: Self-pay

## 2021-01-20 ENCOUNTER — Emergency Department (HOSPITAL_COMMUNITY): Payer: Medicaid Other

## 2021-01-20 ENCOUNTER — Encounter (HOSPITAL_COMMUNITY): Payer: Self-pay | Admitting: Internal Medicine

## 2021-01-20 ENCOUNTER — Inpatient Hospital Stay (HOSPITAL_COMMUNITY)
Admission: EM | Admit: 2021-01-20 | Discharge: 2021-01-21 | DRG: 203 | Disposition: A | Discharge status: Home / Self Care | Payer: Medicaid Other | Attending: Internal Medicine | Admitting: Internal Medicine

## 2021-01-20 DIAGNOSIS — I10 Essential (primary) hypertension: Secondary | ICD-10-CM | POA: Diagnosis present

## 2021-01-20 DIAGNOSIS — F1410 Cocaine abuse, uncomplicated: Secondary | ICD-10-CM | POA: Diagnosis present

## 2021-01-20 DIAGNOSIS — E669 Obesity, unspecified: Secondary | ICD-10-CM | POA: Diagnosis present

## 2021-01-20 DIAGNOSIS — J4551 Severe persistent asthma with (acute) exacerbation: Secondary | ICD-10-CM

## 2021-01-20 DIAGNOSIS — G4733 Obstructive sleep apnea (adult) (pediatric): Secondary | ICD-10-CM | POA: Diagnosis present

## 2021-01-20 DIAGNOSIS — E119 Type 2 diabetes mellitus without complications: Secondary | ICD-10-CM

## 2021-01-20 DIAGNOSIS — J45901 Unspecified asthma with (acute) exacerbation: Principal | ICD-10-CM

## 2021-01-20 DIAGNOSIS — Z6830 Body mass index (BMI) 30.0-30.9, adult: Secondary | ICD-10-CM

## 2021-01-20 DIAGNOSIS — R0902 Hypoxemia: Secondary | ICD-10-CM | POA: Diagnosis not present

## 2021-01-20 DIAGNOSIS — K219 Gastro-esophageal reflux disease without esophagitis: Secondary | ICD-10-CM | POA: Diagnosis present

## 2021-01-20 DIAGNOSIS — Z23 Encounter for immunization: Secondary | ICD-10-CM | POA: Diagnosis not present

## 2021-01-20 DIAGNOSIS — Z825 Family history of asthma and other chronic lower respiratory diseases: Secondary | ICD-10-CM

## 2021-01-20 DIAGNOSIS — Z87891 Personal history of nicotine dependence: Secondary | ICD-10-CM | POA: Diagnosis not present

## 2021-01-20 DIAGNOSIS — Z9119 Patient's noncompliance with other medical treatment and regimen: Secondary | ICD-10-CM

## 2021-01-20 DIAGNOSIS — Z91040 Latex allergy status: Secondary | ICD-10-CM

## 2021-01-20 DIAGNOSIS — J449 Chronic obstructive pulmonary disease, unspecified: Secondary | ICD-10-CM | POA: Diagnosis present

## 2021-01-20 DIAGNOSIS — R062 Wheezing: Secondary | ICD-10-CM | POA: Diagnosis not present

## 2021-01-20 DIAGNOSIS — Z91018 Allergy to other foods: Secondary | ICD-10-CM

## 2021-01-20 DIAGNOSIS — Z20822 Contact with and (suspected) exposure to covid-19: Secondary | ICD-10-CM | POA: Diagnosis present

## 2021-01-20 DIAGNOSIS — R000 Tachycardia, unspecified: Secondary | ICD-10-CM | POA: Diagnosis not present

## 2021-01-20 DIAGNOSIS — R0602 Shortness of breath: Secondary | ICD-10-CM | POA: Diagnosis not present

## 2021-01-20 DIAGNOSIS — R0689 Other abnormalities of breathing: Secondary | ICD-10-CM | POA: Diagnosis not present

## 2021-01-20 DIAGNOSIS — R0603 Acute respiratory distress: Secondary | ICD-10-CM | POA: Diagnosis not present

## 2021-01-20 HISTORY — DX: Essential (primary) hypertension: I10

## 2021-01-20 HISTORY — DX: Unspecified asthma, uncomplicated: J45.909

## 2021-01-20 HISTORY — DX: Type 2 diabetes mellitus without complications: E11.9

## 2021-01-20 HISTORY — DX: Sleep apnea, unspecified: G47.30

## 2021-01-20 LAB — CBC WITH DIFFERENTIAL/PLATELET
Abs Immature Granulocytes: 0.01 10*3/uL (ref 0.00–0.07)
Basophils Absolute: 0.1 10*3/uL (ref 0.0–0.1)
Basophils Relative: 1 %
Eosinophils Absolute: 0.5 10*3/uL (ref 0.0–0.5)
Eosinophils Relative: 7 %
HCT: 45.5 % (ref 36.0–46.0)
Hemoglobin: 14.2 g/dL (ref 12.0–15.0)
Immature Granulocytes: 0 %
Lymphocytes Relative: 44 %
Lymphs Abs: 2.9 10*3/uL (ref 0.7–4.0)
MCH: 28.7 pg (ref 26.0–34.0)
MCHC: 31.2 g/dL (ref 30.0–36.0)
MCV: 92.1 fL (ref 80.0–100.0)
Monocytes Absolute: 0.7 10*3/uL (ref 0.1–1.0)
Monocytes Relative: 10 %
Neutro Abs: 2.6 10*3/uL (ref 1.7–7.7)
Neutrophils Relative %: 38 %
Platelets: 282 10*3/uL (ref 150–400)
RBC: 4.94 MIL/uL (ref 3.87–5.11)
RDW: 15.4 % (ref 11.5–15.5)
WBC: 6.8 10*3/uL (ref 4.0–10.5)
nRBC: 0 % (ref 0.0–0.2)

## 2021-01-20 LAB — RAPID URINE DRUG SCREEN, HOSP PERFORMED
Amphetamines: NOT DETECTED
Barbiturates: NOT DETECTED
Benzodiazepines: NOT DETECTED
Cocaine: POSITIVE — AB
Opiates: NOT DETECTED
Tetrahydrocannabinol: NOT DETECTED

## 2021-01-20 LAB — COMPREHENSIVE METABOLIC PANEL
ALT: 13 U/L (ref 0–44)
AST: 16 U/L (ref 15–41)
Albumin: 3.4 g/dL — ABNORMAL LOW (ref 3.5–5.0)
Alkaline Phosphatase: 53 U/L (ref 38–126)
Anion gap: 6 (ref 5–15)
BUN: 10 mg/dL (ref 6–20)
CO2: 28 mmol/L (ref 22–32)
Calcium: 8.7 mg/dL — ABNORMAL LOW (ref 8.9–10.3)
Chloride: 107 mmol/L (ref 98–111)
Creatinine, Ser: 0.93 mg/dL (ref 0.44–1.00)
GFR, Estimated: >60 mL/min (ref >60)
Glucose, Bld: 109 mg/dL — ABNORMAL HIGH (ref 70–99)
Potassium: 4.2 mmol/L (ref 3.5–5.1)
Sodium: 141 mmol/L (ref 135–145)
Total Bilirubin: 0.4 mg/dL (ref 0.3–1.2)
Total Protein: 7 g/dL (ref 6.5–8.1)

## 2021-01-20 LAB — RESP PANEL BY RT-PCR (FLU A&B, COVID) ARPGX2
Influenza A by PCR: NEGATIVE
Influenza B by PCR: NEGATIVE
SARS Coronavirus 2 by RT PCR: NEGATIVE

## 2021-01-20 LAB — GLUCOSE, CAPILLARY: Glucose-Capillary: 152 mg/dL — ABNORMAL HIGH (ref 70–99)

## 2021-01-20 LAB — TROPONIN I (HIGH SENSITIVITY): Troponin I (High Sensitivity): 4 ng/L (ref <18)

## 2021-01-20 MED ORDER — MONTELUKAST SODIUM 10 MG PO TABS
10.0000 mg | ORAL_TABLET | Freq: Every day | ORAL | Status: DC
  Administered 2021-01-20: 10 mg via ORAL
  Filled 2021-01-20 (×2): qty 1

## 2021-01-20 MED ORDER — ARFORMOTEROL TARTRATE 15 MCG/2ML IN NEBU
15.0000 ug | INHALATION_SOLUTION | Freq: Two times a day (BID) | RESPIRATORY_TRACT | Status: DC
  Administered 2021-01-21: 15 ug via RESPIRATORY_TRACT
  Filled 2021-01-20 (×3): qty 2

## 2021-01-20 MED ORDER — METHYLPREDNISOLONE SODIUM SUCC 125 MG IJ SOLR
60.0000 mg | Freq: Two times a day (BID) | INTRAMUSCULAR | Status: DC
  Administered 2021-01-20 – 2021-01-21 (×2): 60 mg via INTRAVENOUS
  Filled 2021-01-20 (×2): qty 2

## 2021-01-20 MED ORDER — LORATADINE 10 MG PO TABS
10.0000 mg | ORAL_TABLET | Freq: Every day | ORAL | Status: DC
  Administered 2021-01-20 – 2021-01-21 (×2): 10 mg via ORAL
  Filled 2021-01-20 (×2): qty 1

## 2021-01-20 MED ORDER — IPRATROPIUM BROMIDE 0.02 % IN SOLN
0.5000 mg | Freq: Once | RESPIRATORY_TRACT | Status: AC
  Administered 2021-01-20: 0.5 mg via RESPIRATORY_TRACT
  Filled 2021-01-20: qty 2.5

## 2021-01-20 MED ORDER — ALBUTEROL SULFATE (2.5 MG/3ML) 0.083% IN NEBU: 2.5000 mg | INHALATION_SOLUTION | RESPIRATORY_TRACT | Status: DC | PRN

## 2021-01-20 MED ORDER — INSULIN ASPART 100 UNIT/ML ~~LOC~~ SOLN
0.0000 [IU] | SUBCUTANEOUS | Status: DC
  Administered 2021-01-20: 2 [IU] via SUBCUTANEOUS

## 2021-01-20 MED ORDER — ACETAMINOPHEN 325 MG PO TABS: 650.0000 mg | ORAL_TABLET | Freq: Four times a day (QID) | ORAL | Status: DC | PRN

## 2021-01-20 MED ORDER — SODIUM CHLORIDE 0.9 % IV SOLN
75.0000 mL/h | INTRAVENOUS | Status: DC
  Administered 2021-01-20: 75 mL/h via INTRAVENOUS

## 2021-01-20 MED ORDER — ENOXAPARIN SODIUM 40 MG/0.4ML ~~LOC~~ SOLN
40.0000 mg | SUBCUTANEOUS | Status: DC
  Administered 2021-01-21: 40 mg via SUBCUTANEOUS
  Filled 2021-01-20: qty 0.4

## 2021-01-20 MED ORDER — IPRATROPIUM-ALBUTEROL 0.5-2.5 (3) MG/3ML IN SOLN
3.0000 mL | Freq: Four times a day (QID) | RESPIRATORY_TRACT | Status: DC
  Administered 2021-01-21 (×2): 3 mL via RESPIRATORY_TRACT
  Filled 2021-01-20 (×2): qty 3

## 2021-01-20 MED ORDER — HYDROCODONE-ACETAMINOPHEN 5-325 MG PO TABS
1.0000 | ORAL_TABLET | ORAL | Status: DC | PRN
  Administered 2021-01-20 – 2021-01-21 (×3): 1 via ORAL
  Filled 2021-01-20 (×3): qty 1

## 2021-01-20 MED ORDER — FLUTICASONE PROPIONATE 50 MCG/ACT NA SUSP
2.0000 | Freq: Every day | NASAL | Status: DC
  Administered 2021-01-20 – 2021-01-21 (×2): 2 via NASAL
  Filled 2021-01-20: qty 16

## 2021-01-20 MED ORDER — BUDESONIDE 0.5 MG/2ML IN SUSP
0.5000 mg | Freq: Two times a day (BID) | RESPIRATORY_TRACT | Status: DC
  Administered 2021-01-21: 0.5 mg via RESPIRATORY_TRACT
  Filled 2021-01-20 (×3): qty 2

## 2021-01-20 MED ORDER — ACETAMINOPHEN 650 MG RE SUPP: 650.0000 mg | Freq: Four times a day (QID) | RECTAL | Status: DC | PRN

## 2021-01-20 MED ORDER — ALBUTEROL SULFATE (2.5 MG/3ML) 0.083% IN NEBU
2.5000 mg | INHALATION_SOLUTION | Freq: Four times a day (QID) | RESPIRATORY_TRACT | Status: DC
  Administered 2021-01-20: 2.5 mg via RESPIRATORY_TRACT
  Filled 2021-01-20: qty 3

## 2021-01-20 MED ORDER — ALBUTEROL (5 MG/ML) CONTINUOUS INHALATION SOLN
20.0000 mg/h | INHALATION_SOLUTION | RESPIRATORY_TRACT | Status: DC
  Administered 2021-01-20: 20 mg/h via RESPIRATORY_TRACT
  Filled 2021-01-20: qty 20

## 2021-01-20 MED ORDER — ALBUTEROL (5 MG/ML) CONTINUOUS INHALATION SOLN: 10.0000 mg/h | INHALATION_SOLUTION | Freq: Once | RESPIRATORY_TRACT | Status: DC

## 2021-01-20 NOTE — Progress Notes (Signed)
Pt requesting [place] sandwich and graham crackers. RN notified.

## 2021-01-20 NOTE — ED Provider Notes (Signed)
[facility] EMERGENCY DEPARTMENT Provider Note   CSN: [id number] Arrival date & time: 01/20/21  1508     History Chief Complaint  Patient presents with  . Respiratory Distress    Kathleen Rodriguez is a 55 y.o. female with past medical history of diabetes, hypertension, severe persistent asthma requiring intubations in the past, OSA on CPAP presents to the ED by EMS from home for evaluation of shortness of breath.  History obtained from paramedic at bedside.  Level 5 caveat due to acuity of condition.  Patient arrives on CPAP and provides limited history.  Per EMS, patient has had increased shortness of breath for the last 3 days.  She did an albuterol breathing treatment and suddenly felt like her shortness of breath got worse.  By the time EMS arrived to her home patient was diaphoretic, tachypneic and in respiratory distress with hypoxia.  EMS noted bilateral wheezing.  She was given 2 5 mg DuoNeb treatments, 2 mg of magnesium, 125 mg of Solu-Medrol.  Patient reported transient improvement but reports no return of chest tightness and shortness of breath.  Patient states she has been short of breath for the last 3 days.  States her symptoms feel like her asthma has been flaring up.  Denies recent fevers, chills, cough, upper respiratory symptoms.  No vomiting, diarrhea or abdominal pain.  She received 1 Covid vaccine more than 6 months ago.  No sick contacts.  Denies leg swelling.  Reports chest tightness, constant, diffuse. History of seasonal allergies, reports allergies typically worsen her asthma around this time of the year. She denies recent leg swelling or calf pain.  Denies history of blood clots.  No recent surgeries. No hormone therapy, prolonged immobilization.    HPI     No past medical history on file.  There are no problems to display for this patient.   ** The histories are not reviewed yet. Please review them in the "History" navigator section and refresh this  SmartLink.   OB History   No obstetric history on file.     No family history on file.     Home Medications Prior to Admission medications   Not on File    Allergies    Patient has no allergy information on record.  Review of Systems   Review of Systems  Respiratory: Positive for chest tightness, shortness of breath and wheezing.   All other systems reviewed and are negative.   Physical Exam Updated Vital Signs BP 139/75   Pulse 95   Temp (!) 97.4 F (36.3 C) (Axillary)   Resp (!) 21   SpO2 100%   Physical Exam Vitals and nursing note reviewed.  Constitutional:      General: She is in acute distress.     Appearance: She is well-developed. She is diaphoretic.     Comments: Appears uncomfortable.  Sitting straight up, tripoding.  Awake and alert.  Shaking her head yes or no, talking 2-3 word sentences.  HENT:     Head: Normocephalic and atraumatic.     Right Ear: External ear normal.     Left Ear: External ear normal.     Nose: Nose normal.  Eyes:     General: No scleral icterus.    Conjunctiva/sclera: Conjunctivae normal.  Cardiovascular:     Rate and Rhythm: Normal rate and regular rhythm.     Heart sounds: Normal heart sounds.     Comments: 1+ radial and DP pulses bilaterally.  No lower extremity  edema.  No calf tenderness. Pulmonary:     Effort: Respiratory distress present.     Breath sounds: Wheezing (diffuse) present.  Abdominal:     Palpations: Abdomen is soft.     Tenderness: There is no abdominal tenderness.  Musculoskeletal:        General: No deformity. Normal range of motion.     Cervical back: Normal range of motion and neck supple.  Skin:    General: Skin is warm.     Capillary Refill: Capillary refill takes less than 2 seconds.  Neurological:     Mental Status: She is alert and oriented to person, place, and time.  Psychiatric:        Behavior: Behavior normal.        Thought Content: Thought content normal.        Judgment: Judgment  normal.     ED Results / Procedures / Treatments   Labs (all labs ordered are listed, but only abnormal results are displayed) Labs Reviewed  COMPREHENSIVE METABOLIC PANEL - Abnormal; Notable for the following components:      Result Value   Glucose, Bld 109 (*)    Calcium 8.7 (*)    Albumin 3.4 (*)    All other components within normal limits  RESP PANEL BY RT-PCR (FLU A&B, COVID) [id number]  CBC WITH DIFFERENTIAL/PLATELET  RAPID URINE DRUG SCREEN, HOSP PERFORMED  I-STAT ARTERIAL BLOOD GAS, ED    EKG None  Radiology DG Chest Port 1 View  Result Date: 01/20/2021 CLINICAL DATA:  Shortness of breath EXAM: PORTABLE CHEST 1 VIEW COMPARISON:  July 02, 2020 FINDINGS: The heart size and mediastinal contours are within normal limits. Both lungs are clear. The visualized skeletal structures are unremarkable. IMPRESSION: No active disease. Electronically Signed   By: Jonna Clark M.D.   On: 01/20/2021 16:12    Procedures .Critical Care Performed by: Liberty Handy, PA-C Authorized by: Liberty Handy, PA-C   Critical care provider statement:    Critical care time (minutes):  45   Critical care was necessary to treat or prevent imminent or life-threatening deterioration of the following conditions:  Respiratory failure   Critical care was time spent personally by me on the following activities:  Discussions with consultants, evaluation of patient's response to treatment, examination of patient, ordering and performing treatments and interventions, ordering and review of laboratory studies, ordering and review of radiographic studies, pulse oximetry, re-evaluation of patient's condition, obtaining history from patient or surrogate, review of old charts and development of treatment plan with patient or surrogate   I assumed direction of critical care for this patient from another provider in my specialty: no       Medications Ordered in ED Medications  albuterol  (PROVENTIL,VENTOLIN) solution continuous neb (20 mg/hr Nebulization New Bag/Given 01/20/21 1544)  arformoterol (BROVANA) nebulizer solution 15 mcg (has no administration in time range)  budesonide (PULMICORT) nebulizer solution 0.5 mg (has no administration in time range)  albuterol (PROVENTIL) (2.5 MG/3ML) 0.083% nebulizer solution 2.5 mg (has no administration in time range)    Followed by  albuterol (PROVENTIL) (2.5 MG/3ML) 0.083% nebulizer solution 2.5 mg (has no administration in time range)  methylPREDNISolone sodium succinate (SOLU-MEDROL) 125 mg/2 mL injection 60 mg (60 mg Intravenous Given 01/20/21 1940)  fluticasone (FLONASE) 50 MCG/ACT nasal spray 2 spray (2 sprays Each Nare Given 01/20/21 1938)  loratadine (CLARITIN) tablet 10 mg (10 mg Oral Given 01/20/21 1937)  montelukast (SINGULAIR) tablet 10 mg (has no administration in  time range)  ipratropium (ATROVENT) nebulizer solution 0.5 mg (0.5 mg Nebulization Given 01/20/21 1556)    ED Course  I have reviewed the triage vital signs and the nursing notes.  Pertinent labs & imaging results that were available during my care of the patient were reviewed by me and considered in my medical decision making (see chart for details).    MDM Rules/Calculators/A&P                          55 yo F here with hypoxia 85% on RA per EMS. History of asthma and worsening shortness of breath for 3 days. Endorses seasonal allergies. Arrives in respiratory distress. "I'm tired". Considering epi.   EMR triage and nursing notes reviewed to obtain more history and assist with MDM  Additional history obtained by EMS. Has received solumedrol, duonebs, mag PTA. On CPAP.   Labs, imaging ordered by me. Labs and imaging personally visualized and interpreted  Labs unremarkable. No leukocytosis. Resp panel negative.   Imaging - CXR unremarkable. EKG with ST and questionable ST depression likely related to rate.   Patient re-evaluated several times, endorses 0%  improvement in the ER. Does not appear particularly tired, or in significant distress however. Keeps saying she is "tired" and "done".  Encouraged her to be patient as medicines work.    EDP in with another critical patient requiring intubation, requesting ICU consult as patient may deteriorate and to assist with ongoing care. May benefit from Epi vs ETT vs ICU vs SDU admission.   2033: evaluated by PCCM who has placed orders, will follow requesting admission for step down. Pending medicine consult.  Final Clinical Impression(s) / ED Diagnoses Final diagnoses:  Asthma with severe exacerbation    Rx / DC Orders ED Discharge Orders    None       Jerrell Mylar 01/20/21 2035    Tegeler, Canary Brim, MD 01/22/21 2311

## 2021-01-20 NOTE — Consult Note (Addendum)
NAME:  Kathleen Rodriguez, MRN:  [id number], DOB:  October 13, 1966, LOS: 0 ADMISSION DATE:  01/20/2021, CONSULTATION DATE:  01/20/21 REFERRING MD:  Tegeler CHIEF COMPLAINT:  Dyspnea   Pending chart merger with chart w/ MRN  [id number]   History of Present Illness:   Kathleen Rodriguez is a 55 y.o. female who presented to ED 3/30 with progressive dyspnea x 3 days.  Fire was called first and administered a breathing neb which had minimal improvement.  EMS arrived, found hypoxic on room air at 85%, and placed on CPAP before treating with Magnesium, Solumedrol, DuoNeb and transporting to [facility] ED.  Patient is followed in our office by Dr. Craige Cotta.  She has been difficult to manage in the past.  Required intubation in 2016 for asthma exacerbation.  States this time of year is difficult for her due to seasonal allergies.  Last seen in the office 1/6.  At that time Virgel Bouquet was added to Symbicort, continued on Singulair and Claritin and paperwork started to start on Fasenra injections.  She was approved however did not show for her 3/7 appointment for Fasenra.  States she has limited transportation at home.  Has only been taking her Symbicort and as needed albuterol and has been out of her Singulair and Claritin.  Not taking Breo or using CPAP.  She recently called the office complaining of worsening shortness of breath and was placed on a steroid taper 3/14 in which patient states she completed but as soon as she finished, her symptoms returned.   In ED, she was switched to BiPAP due to increased WOB.  She informed EDP that she felt as if she were having a typical asthma exacerbation.  Does complain of some chest tightness.  Denied any recent fevers/chills/sweats, cough, N/V/D, abd pain, myalgias, exposures to known sick contacts.  Has received 1 COVID vaccine more then 6 months ago.  Did home Covid test 2 days ago which was negative.  Flu and COVID were negative.  CXR clear.   PCCM called in consultation given continued BiPAP needs/  ICU evaluation.   Pertinent  Medical History:  Former smoker, uncontrolled persistent allergic asthma, OSA, noncompliant with CPAP, OA, IDA, hypertension, obesity, GERD Hx of cocaine abuse  Received 1 Covid vaccination Significant Hospital Events: Including procedures, antibiotic start and stop dates in addition to other pertinent events   3/30 admit.  Micro Data: Flu 3/30 > neg SARS CoV2 3/30 > neg.  Antibiotics: n/a  Interim History / Subjective:    Objective:  Blood pressure 134/69, pulse (!) 105, resp. rate (!) 21, SpO2 97 %.    FiO2 (%):  [40 %] 40 %  No intake or output data in the 24 hours ending 01/20/21 1731 There were no vitals filed for this visit.  Examination: General: non toxic appearing, well nourished adult female on BiPAP in NAD  HEENT: full face BiPAP Neuro: AOx3 CV: rr PULM:  Non labored on BiPAP 12/5, 40%- getting > 500 volumes consistently, speaking in sentences, diffuse exp wheeze, tight GI: obese, soft, bs active  Extremities: warm/dry, no edema  Skin: no rashes   Labs/imaging personally reviewed:  CXR 3/30 > neg.  Resolved Hospital Problem list:   Assessment & Plan:   Uncontrolled allergic Asthma with acute exacerbation. - suspect she is non-compliant with her home regimen - Continue BiPAP for now/ PRN  - ok for PCU, pulmonary will continue to follow - Maintain sats > 92%. - Continue IV steroids, solumedrol 60 mg q 12 and  taper with clinical improvement - Continue aggressive BD therapy- albuterol q 4 with brovanna/ pulmicort nebs - restart Singulair, claritin, flonase  - check UDS - has been approved for Fasenra injections but did not show for first injection on 3/7  OSA, noncompliant  - encourage CPAP use at home  Remainder per primary.  PCCM will continue to follow  Labs   CBC: Recent Labs  Lab 01/20/21 1545  WBC 6.8  NEUTROABS 2.6  HGB 14.2  HCT 45.5  MCV 92.1  PLT 282    Basic Metabolic Panel: Recent Labs  Lab  01/20/21 1545  NA 141  K 4.2  CL 107  CO2 28  GLUCOSE 109*  BUN 10  CREATININE 0.93  CALCIUM 8.7*   GFR: CrCl cannot be calculated (Unknown ideal weight.). Recent Labs  Lab 01/20/21 1545  WBC 6.8    Liver Function Tests: Recent Labs  Lab 01/20/21 1545  AST 16  ALT 13  ALKPHOS 53  BILITOT 0.4  PROT 7.0  ALBUMIN 3.4*   No results for input(s): LIPASE, AMYLASE in the last 168 hours. No results for input(s): AMMONIA in the last 168 hours.  ABG No results found for: PHART, PCO2ART, PO2ART, HCO3, TCO2, ACIDBASEDEF, O2SAT   Coagulation Profile: No results for input(s): INR, PROTIME in the last 168 hours.  Cardiac Enzymes: No results for input(s): CKTOTAL, CKMB, CKMBINDEX, TROPONINI in the last 168 hours.  HbA1C: No results found for: HGBA1C  CBG: No results for input(s): GLUCAP in the last 168 hours.  Review of Systems:   Review of Systems  Constitutional: Negative for chills, fever, malaise/fatigue and weight loss.  HENT: Positive for congestion. Negative for sinus pain.   Respiratory: Positive for cough, shortness of breath and wheezing. Negative for hemoptysis and sputum production.   Cardiovascular: Positive for chest pain. Negative for palpitations, orthopnea and leg swelling.  Gastrointestinal: Negative for abdominal pain, nausea and vomiting.  Neurological: Negative for focal weakness and loss of consciousness.  Endo/Heme/Allergies: Positive for environmental allergies.   Past Medical History:  Former smoker, uncontrolled persistent allergic asthma, OSA, noncompliant with CPAP, OA, IDA, hypertension, obesity, GERD Hx of cocaine abuse  Surgical History:  Unknown.    Social History:    former smoker, hx of cocaine abuse  Family History:  Her family history is not on file.   Allergies Not on File   Home Medications  Prior to Admission medications   Not on File           Posey Boyer, ACNP [facility] 01/20/2021,  6:25 PM

## 2021-01-20 NOTE — Progress Notes (Signed)
Pt taken off of bipap and placed on 2L [place] at this time. No Resp.distress noted.

## 2021-01-20 NOTE — ED Notes (Signed)
Pgd CC [id number]

## 2021-01-20 NOTE — Progress Notes (Signed)
Patient placed on bipap on arrival. Continuous neb started. Patient refusing ABG. MD made aware. Venous gas obtained by RN.

## 2021-01-20 NOTE — ED Notes (Signed)
Adjusted pt cuff and re-checked BP, with improvement

## 2021-01-20 NOTE — ED Notes (Signed)
Critical Care pgd 1714 for [place]

## 2021-01-20 NOTE — Progress Notes (Signed)
Transported pt from Ed to 6E Rm 22. No complications noted

## 2021-01-20 NOTE — H&P (Addendum)
Robin Petrakis GNF:[id number] DOB: Sep 16, 1966 DOA: 01/20/2021     PCP: Hoy Register, MD   [facility]    Pulmonary    Dr. Craige Cotta   Patient arrived to ER on 01/20/21 at 1508 Referred by Attending Tegeler, Canary Brim, *  Patient coming from: home Lives With family    Chief Complaint:   Chief Complaint  Patient presents with  . Respiratory Distress    HPI: Oscar Forman is a 55 y.o. female with medical history significant of DM2, HTN, Asthma, OSA on CPAP, non-compliance obesity, GERD, substance abuse    Presented with   3 days of weezing very short of breath EMS was called Initially satting 85% on room air by EMS. Started on BiPAP History of severe asthma with prior intubations 2016.  She usually does fairly worse with allergies.  Had a phone call with pulmonologist Dr. Craige Cotta couple weeks ago and was prescribed prednisone taper.  She reached the end of her taper and started given worsening shortness of breath. Last seen in the office 1/6.  At that time Virgel Bouquet was added to Symbicort, continued on Singulair and Claritin and paperwork started to start on Fasenra injections. She was supposed to get her Harrington Challenger starting this month but missed her last appointment She has only been taking her Symbicort has been out of her Singulair and Claritin. Has not been using the CPAP or using her Breo at home  Patient states that her asthma has gotten really bad since she moved to [place] from [place].  Has a lot of allergic components to it.  Infectious risk factors:  Reports  shortness of breath, dry cough,    Has  been vaccinated against COVID x1 and would like a booster   Initial COVID TEST  NEGATIVE   Lab Results  Component Value Date   SARSCOV2NAA NEGATIVE 01/20/2021     Regarding pertinent Chronic problems:    OSA noncompliant with CPAP   Asthma -poorly controlled on home inhalers/ nebs                          history of intubation followed by  pulmonology      While in ER: Was given repeated treatment with nebulizers Solu-Medrol 25 mg was given Was seen in ER by PCCM Started on BiPAP for work of breathing COVId negative   ED Triage Vitals  Enc Vitals Group     BP 01/20/21 1517 (!) 168/86     Pulse Rate 01/20/21 1515 (!) 111     Resp 01/20/21 1515 (!) 25     Temp 01/20/21 2029 (!) 97.4 F (36.3 C)     Temp Source 01/20/21 2029 Axillary     SpO2 01/20/21 1515 100 %     Weight --      Height --      Head Circumference --      Peak Flow --      Pain Score --      Pain Loc --      Pain Edu? --      Excl. in GC? --   TMAX(24)@     _________________________________________ Significant initial  Findings: Abnormal Labs Reviewed  COMPREHENSIVE METABOLIC PANEL - Abnormal; Notable for the following components:      Result Value   Glucose, Bld 109 (*)    Calcium 8.7 (*)    Albumin 3.4 (*)    All other components within normal limits  ____________________________________________ Ordered   CXR -  NON acute  ______________________ Troponin  ordered ECG: Ordered Personally reviewed by me showing: HR : 110 Rhythm:  Sinus tachycardia    nonspecific changes,   QTC 437  The recent clinical data is shown below. Vitals:   01/20/21 1830 01/20/21 1930 01/20/21 2029 01/20/21 2030  BP: 128/74 (!) 143/80 (!) 96/56 139/75  Pulse: 97 95 95   Resp: 19 (!) 22 (!) 21   Temp:   (!) 97.4 F (36.3 C)   TempSrc:   Axillary   SpO2: 98% 98% 100%     WBC     Component Value Date/Time   WBC 6.8 01/20/2021 1545   LYMPHSABS 2.9 01/20/2021 1545   MONOABS 0.7 01/20/2021 1545   EOSABS 0.5 01/20/2021 1545   BASOSABS 0.1 01/20/2021 1545      UA  not ordered     Results for orders placed or performed during the hospital encounter of 01/20/21  Resp Panel by RT-PCR (Flu A&B, Covid) Nasopharyngeal Swab     Status: None   Collection Time: 01/20/21  4:08 PM   Specimen: Nasopharyngeal Swab; Nasopharyngeal(NP) swabs in vial transport  medium  Result Value Ref Range Status   SARS Coronavirus 2 by RT PCR NEGATIVE NEGATIVE Final         Influenza A by PCR NEGATIVE NEGATIVE Final   Influenza B by PCR NEGATIVE NEGATIVE Final           _______________________________________________________ ER Provider Called:   PCCM They Recommend admit to medicine to Stepdown  SEEN in ER _______________________________________________ Hospitalist was called for admission for asthma exacerbation  The following Work up has been ordered so far:  Orders Placed This Encounter  Procedures  . Critical Care  . Resp Panel by RT-PCR (Flu A&B, Covid) Nasopharyngeal Swab  . DG Chest Port 1 View  . Comprehensive metabolic panel  . CBC WITH DIFFERENTIAL  . Urine rapid drug screen (hosp performed)  . Initiate Carrier Fluid Protocol  . Cardiac monitoring  . Check Peak Flow  . Consult for [facility] Admission  . Pulse oximetry, continuous  . Adult wheeze protocol - initiate by RT - includes Albuterol/Atrovent Treatment  . Bipap  . I-Stat arterial blood gas, ED  ([facility], [facility])  . EKG 12-Lead  . ED EKG  . Insert peripheral IV    Following Medications were ordered in ER: Medications  albuterol (PROVENTIL,VENTOLIN) solution continuous neb (20 mg/hr Nebulization New Bag/Given 01/20/21 1544)  arformoterol (BROVANA) nebulizer solution 15 mcg (has no administration in time range)  budesonide (PULMICORT) nebulizer solution 0.5 mg (has no administration in time range)  albuterol (PROVENTIL) (2.5 MG/3ML) 0.083% nebulizer solution 2.5 mg (has no administration in time range)    Followed by  albuterol (PROVENTIL) (2.5 MG/3ML) 0.083% nebulizer solution 2.5 mg (has no administration in time range)  methylPREDNISolone sodium succinate (SOLU-MEDROL) 125 mg/2 mL injection 60 mg (60 mg Intravenous Given 01/20/21 1940)  fluticasone (FLONASE) 50 MCG/ACT nasal spray 2 spray (2 sprays Each Nare Given 01/20/21 1938)  loratadine (CLARITIN) tablet 10 mg (10 mg  Oral Given 01/20/21 1937)  montelukast (SINGULAIR) tablet 10 mg (has no administration in time range)  ipratropium (ATROVENT) nebulizer solution 0.5 mg (0.5 mg Nebulization Given 01/20/21 1556)    OTHER Significant initial  Findings:  labs showing:    Recent Labs  Lab 01/20/21 1545  NA 141  K 4.2  CO2 28  GLUCOSE 109*  BUN 10  CREATININE 0.93  CALCIUM  8.7*    Cr  stable,    Lab Results  Component Value Date   CREATININE 0.93 01/20/2021    Recent Labs  Lab 01/20/21 1545  AST 16  ALT 13  ALKPHOS 53  BILITOT 0.4  PROT 7.0  ALBUMIN 3.4*   Lab Results  Component Value Date   CALCIUM 8.7 (L) 01/20/2021          Plt: Lab Results  Component Value Date   PLT 282 01/20/2021      Venous  Blood Gas result:  ordered    Recent Labs  Lab 01/20/21 1545  WBC 6.8  NEUTROABS 2.6  HGB 14.2  HCT 45.5  MCV 92.1  PLT 282    HG/HCT  Stable,     Component Value Date/Time   HGB 14.2 01/20/2021 1545   HCT 45.5 01/20/2021 1545   MCV 92.1 01/20/2021 1545    DM  labs:  HbA1C: No results for input(s): HGBA1C in the last 8760 hours.     CBG (last 3)  No results for input(s): GLUCAP in the last 72 hours.     Radiological Exams on Admission: DG Chest Port 1 View  Result Date: 01/20/2021 CLINICAL DATA:  Shortness of breath EXAM: PORTABLE CHEST 1 VIEW COMPARISON:  July 02, 2020 FINDINGS: The heart size and mediastinal contours are within normal limits. Both lungs are clear. The visualized skeletal structures are unremarkable. IMPRESSION: No active disease. Electronically Signed   By: Jonna Clark M.D.   On: 01/20/2021 16:12   _______________________________________________________________________________________________________ Latest  Blood pressure 139/75, pulse 95, temperature (!) 97.4 F (36.3 C), temperature source Axillary, resp. rate (!) 21, SpO2 100 %.   Review of Systems:    Pertinent positives include   fatigue,  shortness of breath at rest.  dyspnea on  exertion,  non-productive cough,  wheezing. Constitutional:  No weight loss, night sweats, Fevers, chills,weight loss  HEENT:  No headaches, Difficulty swallowing,Tooth/dental problems,Sore throat,  No sneezing, itching, ear ache, nasal congestion, post nasal drip,  Cardio-vascular:  No chest pain, Orthopnea, PND, anasarca, dizziness, palpitations.no Bilateral lower extremity swelling  GI:  No heartburn, indigestion, abdominal pain, nausea, vomiting, diarrhea, change in bowel habits, loss of appetite, melena, blood in stool, hematemesis Resp:  no  No excess mucus, no productive cough, No No coughing up of blood.No change in color of mucus.  Skin:  no rash or lesions. No jaundice GU:  no dysuria, change in color of urine, no urgency or frequency. No straining to urinate.  No flank pain.  Musculoskeletal:  No joint pain or no joint swelling. No decreased range of motion. No back pain.  Psych:  No change in mood or affect. No depression or anxiety. No memory loss.  Neuro: no localizing neurological complaints, no tingling, no weakness, no double vision, no gait abnormality, no slurred speech, no confusion  All systems reviewed and apart from HOPI all are negative _______________________________________________________________________________________________ Past Medical History:   Past Medical History:  Diagnosis Date  . Asthma   . Diabetes mellitus without complication (HCC)   . Hypertension   . Sleep apnea        Social History:  Ambulatory   independently       reports that she has quit smoking. She has never used smokeless tobacco. She reports previous alcohol use. She reports current drug use. Drug: Cocaine.     Family History  Problem Relation Age of Onset  . Asthma Mother    ______________________________________________________________________________________________ Allergies: Allergies  Allergen Reactions  . Latex   . Tomato      Prior to Admission  medications   Not on File    ___________________________________________________________________________________________________ Physical Exam: Vitals with BMI 01/20/2021 01/20/2021 01/20/2021  Systolic 139 96 143  Diastolic 75 56 80  Pulse - 95 95     1. General:  in No  Acute distress   Chronically ill  -appearing 2. Psychological: Alert and  Oriented 3. Head/ENT:   Dry Mucous Membranes                          Head Non traumatic, neck supple                            Poor Dentition 4. SKIN:   decreased Skin turgor,  Skin clean Dry and intact no rash 5. Heart: Regular rate and rhythm no Murmur, no Rub or gallop 6. Lungs:    Bilateral wheezes no crackles   7. Abdomen: Soft,  non-tender, Non distended obese bowel sounds present 8. Lower extremities: no clubbing, cyanosis, no  edema 9. Neurologically Grossly intact, moving all 4 extremities equally  10. MSK: Normal range of motion    Chart has been reviewed  ______________________________________________________________________________________________  Assessment/Plan   55 y.o. female with medical history significant of DM2, HTN, Asthma, OSA on CPAP, non-compliance obesity, GERD, substance abuse    Admitted for asthma exacerbation  Present on Admission: . Asthma exacerbation  - Will initiate: Steroid taper - Albuterol  PRN,  -  Breo   Titrate O2 to saturation >90%. Follow patients respiratory status.  VBG ordred -  PCCM consulted    -  BiPAP ordered PRN for increased work of breathing.  Currently mentating well no evidence of symptomatic hypercarbia  . OSA (obstructive sleep apnea) - now on BIPAP  . Essential hypertension - allow permissive htn given SOFT BP in ER  . Obesity (BMI 30-39.9) - nutritional consult as an out pt  . Cocaine abuse (HCC) -order transitional care consult recommend cessation of cocaine abuse is likely contributing to his asthma exacerbations  DM2 -  - Order Sensitive  SSI     -  check TSH  and HgA1C  - Hold by mouth medications     Other plan as per orders.  DVT prophylaxis:    Lovenox       Code Status:    Code Status: Not on file FULL CODE  as per patient   I had personally discussed CODE STATUS with patient     Family Communication:   Family not at  Bedside    Disposition Plan:     To home once workup is complete and patient is stable   Following barriers for discharge:                          Work of breathing improved                                                          Will need to be able to tolerate PO  Will need consultants to evaluate patient prior to discharge                      Would benefit from PT/OT eval prior to DC  Ordered                                        Transition of care consulted                                     Consults called: PCCM  Admission status:  ED Disposition    ED Disposition Condition Comment   Admit  [facility]ospital [facility] [[id number]]  Level of Care: Progressive [102]  Admit to Progressive based on following criteria: RESPIRATORY PROBLEMS hypoxemic/hypercapnic respiratory failure that is responsive to NIPPV (BiPAP) or High Flow Nasal Cannula (6-80 lpm). Frequent assessment/intervention, no > Q2 hrs < Q4 hrs, to maintain oxygenation and pulmonary hygiene.  May admit patient to [facility] or [facility] if equivalent level of care is available:: No  Covid Evaluation: Confirmed COVID Negative  Diagnosis: Asthma exacerbation [591638]  Admitting Physician: Therisa Doyne [[id number]]  Attending Physician: Therisa Doyne [[id number]]  Estimated length of stay: past midnight tomorrow  Certification:: I certify this patient will need inpatient services for at least 2 midnights         inpatient     I Expect 2 midnight stay secondary to severity of patient's current illness need for inpatient interventions justified by the following:   hemodynamic instability despite optimal treatment (tachycardia  Tachypnea )   and extensive comorbidities including:  substance abuse   DM2   COPD/asthma  Obesity   That are currently affecting medical management.   I expect  patient to be hospitalized for 2 midnights requiring inpatient medical care.  Patient is at high risk for adverse outcome (such as loss of life or disability) if not treated.  Indication for inpatient stay as follows:   need for BIPAP , severe asthma exacerbation Need for   IV fluids, IV steroids   Level of care       SDU tele indefinitely please discontinue once patient no longer qualifies COVID-19 Labs    Lab Results  Component Value Date   SARSCOV2NAA NEGATIVE 01/20/2021     Precautions: admitted as   Covid Negative    PPE: Used by the provider:   N95  eye Goggles,  Gloves       Jerita Wimbush 01/20/2021, 10:13 PM    [facility]     after 2 AM please page floor coverage PA If 7AM-7PM, please contact the day team taking care of the patient using [email or web address]   Patient was evaluated in the context of the global COVID-19 pandemic, which necessitated consideration that the patient might be at risk for infection with the SARS-CoV-2 virus that causes COVID-19. Institutional protocols and algorithms that pertain to the evaluation of patients at risk for COVID-19 are in a state of rapid change based on information released by regulatory bodies including the CDC and federal and state organizations. These policies and algorithms were followed during the patient's care.

## 2021-01-20 NOTE — ED Triage Notes (Signed)
Pt bibems from home. Chronic hx of asthma. Pt Sob x3days. Pt had bilateral wheezing with fires arrival.  Pt diaphoretic.   Pt has hx of intubation in the past. Pt given 2 5mg  of duoneb, 2mg  of mag and 125 of solumedrol. Pt alert and oriented x4. Pt on CPAP with EMS.

## 2021-01-21 ENCOUNTER — Other Ambulatory Visit (HOSPITAL_COMMUNITY): Payer: Self-pay | Admitting: Internal Medicine

## 2021-01-21 DIAGNOSIS — F1410 Cocaine abuse, uncomplicated: Secondary | ICD-10-CM | POA: Diagnosis not present

## 2021-01-21 DIAGNOSIS — J4551 Severe persistent asthma with (acute) exacerbation: Secondary | ICD-10-CM | POA: Diagnosis not present

## 2021-01-21 LAB — CBC WITH DIFFERENTIAL/PLATELET
Abs Immature Granulocytes: 0.02 10*3/uL (ref 0.00–0.07)
Basophils Absolute: 0 10*3/uL (ref 0.0–0.1)
Basophils Relative: 0 %
Eosinophils Absolute: 0 10*3/uL (ref 0.0–0.5)
Eosinophils Relative: 0 %
HCT: 42.1 % (ref 36.0–46.0)
Hemoglobin: 13.5 g/dL (ref 12.0–15.0)
Immature Granulocytes: 0 %
Lymphocytes Relative: 11 %
Lymphs Abs: 0.7 10*3/uL (ref 0.7–4.0)
MCH: 29.3 pg (ref 26.0–34.0)
MCHC: 32.1 g/dL (ref 30.0–36.0)
MCV: 91.3 fL (ref 80.0–100.0)
Monocytes Absolute: 0.1 10*3/uL (ref 0.1–1.0)
Monocytes Relative: 2 %
Neutro Abs: 5.4 10*3/uL (ref 1.7–7.7)
Neutrophils Relative %: 87 %
Platelets: 262 10*3/uL (ref 150–400)
RBC: 4.61 MIL/uL (ref 3.87–5.11)
RDW: 15.4 % (ref 11.5–15.5)
WBC: 6.2 10*3/uL (ref 4.0–10.5)
nRBC: 0 % (ref 0.0–0.2)

## 2021-01-21 LAB — COMPREHENSIVE METABOLIC PANEL
ALT: 14 U/L (ref 0–44)
AST: 16 U/L (ref 15–41)
Albumin: 3.3 g/dL — ABNORMAL LOW (ref 3.5–5.0)
Alkaline Phosphatase: 48 U/L (ref 38–126)
Anion gap: 7 (ref 5–15)
BUN: 11 mg/dL (ref 6–20)
CO2: 26 mmol/L (ref 22–32)
Calcium: 9.2 mg/dL (ref 8.9–10.3)
Chloride: 106 mmol/L (ref 98–111)
Creatinine, Ser: 0.74 mg/dL (ref 0.44–1.00)
GFR, Estimated: >60 mL/min (ref >60)
Glucose, Bld: 123 mg/dL — ABNORMAL HIGH (ref 70–99)
Potassium: 4.2 mmol/L (ref 3.5–5.1)
Sodium: 139 mmol/L (ref 135–145)
Total Bilirubin: 0.4 mg/dL (ref 0.3–1.2)
Total Protein: 6.9 g/dL (ref 6.5–8.1)

## 2021-01-21 LAB — GLUCOSE, CAPILLARY
Glucose-Capillary: 114 mg/dL — ABNORMAL HIGH (ref 70–99)
Glucose-Capillary: 157 mg/dL — ABNORMAL HIGH (ref 70–99)
Glucose-Capillary: 174 mg/dL — ABNORMAL HIGH (ref 70–99)
Glucose-Capillary: 79 mg/dL (ref 70–99)

## 2021-01-21 LAB — HIV ANTIBODY (ROUTINE TESTING W REFLEX): HIV Screen 4th Generation wRfx: NONREACTIVE

## 2021-01-21 LAB — HEMOGLOBIN A1C
Hgb A1c MFr Bld: 5.8 % — ABNORMAL HIGH (ref 4.8–5.6)
Mean Plasma Glucose: 119.76 mg/dL

## 2021-01-21 LAB — PHOSPHORUS: Phosphorus: 3.4 mg/dL (ref 2.5–4.6)

## 2021-01-21 LAB — MAGNESIUM: Magnesium: 2 mg/dL (ref 1.7–2.4)

## 2021-01-21 LAB — TSH: TSH: 0.446 u[IU]/mL (ref 0.350–4.500)

## 2021-01-21 MED ORDER — BUDESONIDE 0.25 MG/2ML IN SUSP: 0.2500 mg | Freq: Two times a day (BID) | RESPIRATORY_TRACT | 12 refills | Status: DC

## 2021-01-21 MED ORDER — FLUCONAZOLE 100 MG PO TABS: 100.0000 mg | ORAL_TABLET | Freq: Once | ORAL | 0 refills | Status: DC

## 2021-01-21 MED ORDER — PREDNISONE 10 MG PO TABS: ORAL_TABLET | ORAL | 0 refills | Status: DC

## 2021-01-21 MED ORDER — IPRATROPIUM-ALBUTEROL 0.5-2.5 (3) MG/3ML IN SOLN: 3.0000 mL | Freq: Three times a day (TID) | RESPIRATORY_TRACT | Status: DC

## 2021-01-21 MED ORDER — MONTELUKAST SODIUM 10 MG PO TABS: 10.0000 mg | ORAL_TABLET | Freq: Every day | ORAL | 0 refills | Status: DC

## 2021-01-21 MED ORDER — MONTELUKAST SODIUM 10 MG PO TABS
10.0000 mg | ORAL_TABLET | Freq: Every day | ORAL | 0 refills | Status: DC
Start: 2021-01-21 — End: 2021-01-21

## 2021-01-21 MED ORDER — COVID-19 MRNA VACC (MODERNA) 100 MCG/0.5ML IM SUSP
0.5000 mL | Freq: Once | INTRAMUSCULAR | Status: AC
  Administered 2021-01-21: 0.5 mL via INTRAMUSCULAR
  Filled 2021-01-21: qty 0.5

## 2021-01-21 MED FILL — MONTELUKAST SOD 10 MG TAB: 10 | 30 days supply | Qty: 30 | Fill #0

## 2021-01-21 MED FILL — predniSONE 10 MG TABS: 10 | 12 days supply | Qty: 30 | Fill #0

## 2021-01-21 MED FILL — PULMICORT 0.25 MG/2 ML RESP: 0.25 | 15 days supply | Qty: 60 | Fill #0

## 2021-01-21 MED FILL — FLUCONAZOLE 100 MG TABLET: 100 | 1 days supply | Qty: 1 | Fill #0

## 2021-01-21 NOTE — Discharge Instructions (Signed)
Asthma, Adult  Asthma is a long-term (chronic) condition in which the airways get tight and narrow. The airways are the breathing passages that lead from the nose and mouth down into the lungs. A person with asthma will have times when symptoms get worse. These are called asthma attacks. They can cause coughing, whistling sounds when you breathe (wheezing), shortness of breath, and chest pain. They can make it hard to breathe. There is no cure for asthma, but medicines and lifestyle changes can help control it. There are many things that can bring on an asthma attack or make asthma symptoms worse (triggers). Common triggers include:  Mold.  Dust.  Cigarette smoke.  Cockroaches.  Things that can cause allergy symptoms (allergens). These include animal skin flakes (dander) and pollen from trees or grass.  Things that pollute the air. These may include household cleaners, wood smoke, smog, or chemical odors.  Cold air, weather changes, and wind.  Crying or laughing hard.  Stress.  Certain medicines or drugs.  Certain foods such as dried fruit, potato chips, and grape juice.  Infections, such as a cold or the flu.  Certain medical conditions or diseases.  Exercise or tiring activities. Asthma may be treated with medicines and by staying away from the things that cause asthma attacks. Types of medicines may include:  Controller medicines. These help prevent asthma symptoms. They are usually taken every day.  Fast-acting reliever or rescue medicines. These quickly relieve asthma symptoms. They are used as needed and provide short-term relief.  Allergy medicines if your attacks are brought on by allergens.  Medicines to help control the body's defense (immune) system. Follow these instructions at home: Avoiding triggers in your home  Change your heating and air conditioning filter often.  Limit your use of fireplaces and wood stoves.  Get rid of pests (such as roaches and  mice) and their droppings.  Throw away plants if you see mold on them.  Clean your floors. Dust regularly. Use cleaning products that do not smell.  Have someone vacuum when you are not home. Use a vacuum cleaner with a HEPA filter if possible.  Replace carpet with wood, tile, or vinyl flooring. Carpet can trap animal skin flakes and dust.  Use allergy-proof pillows, mattress covers, and box spring covers.  Wash bed sheets and blankets every week in hot water. Dry them in a dryer.  Keep your bedroom free of any triggers.  Avoid pets and keep windows closed when things that cause allergy symptoms are in the air.  Use blankets that are made of polyester or cotton.  Clean bathrooms and kitchens with bleach. If possible, have someone repaint the walls in these rooms with mold-resistant paint. Keep out of the rooms that are being cleaned and painted.  Wash your hands often with soap and water. If soap and water are not available, use hand sanitizer.  Do not allow anyone to smoke in your home. General instructions  Take over-the-counter and prescription medicines only as told by your doctor. ? Talk with your doctor if you have questions about how or when to take your medicines. ? Make note if you need to use your medicines more often than usual.  Do not use any products that contain nicotine or tobacco, such as cigarettes and e-cigarettes. If you need help quitting, ask your doctor.  Stay away from secondhand smoke.  Avoid doing things outdoors when allergen counts are high and when air quality is low.  Wear a ski mask   when doing outdoor activities in the winter. The mask should cover your nose and mouth. Exercise indoors on cold days if you can.  Warm up before you exercise. Take time to cool down after exercise.  Use a peak flow meter as told by your doctor. A peak flow meter is a tool that measures how well the lungs are working.  Keep track of the peak flow meter's readings.  Write them down.  Follow your asthma action plan. This is a written plan for taking care of your asthma and treating your attacks.  Make sure you get all the shots (vaccines) that your doctor recommends. Ask your doctor about a flu shot and a pneumonia shot.  Keep all follow-up visits as told by your doctor. This is important. Contact a doctor if:  You have wheezing, shortness of breath, or a cough even while taking medicine to prevent attacks.  The mucus you cough up (sputum) is thicker than usual.  The mucus you cough up changes from clear or white to yellow, green, gray, or bloody.  You have problems from the medicine you are taking, such as: ? A rash. ? Itching. ? Swelling. ? Trouble breathing.  You need reliever medicines more than 2-3 times a week.  Your peak flow reading is still at 50-79% of your personal best after following the action plan for 1 hour.  You have a fever. Get help right away if:  You seem to be worse and are not responding to medicine during an asthma attack.  You are short of breath even at rest.  You get short of breath when doing very little activity.  You have trouble eating, drinking, or talking.  You have chest pain or tightness.  You have a fast heartbeat.  Your lips or fingernails start to turn blue.  You are light-headed or dizzy, or you faint.  Your peak flow is less than 50% of your personal best.  You feel too tired to breathe normally. Summary  Asthma is a long-term (chronic) condition in which the airways get tight and narrow. An asthma attack can make it hard to breathe.  Asthma cannot be cured, but medicines and lifestyle changes can help control it.  Make sure you understand how to avoid triggers and how and when to use your medicines. This information is not intended to replace advice given to you by your health care provider. Make sure you discuss any questions you have with your health care provider. Document Revised:  02/12/2020 Document Reviewed: 02/12/2020 Elsevier Patient Education  2021 [employer]   

## 2021-01-21 NOTE — Progress Notes (Signed)
NAME:  Kathleen Rodriguez, MRN:  [id number], DOB:  11/21/1965, LOS: 1 ADMISSION DATE:  01/20/2021, CONSULTATION DATE:  01/20/21 REFERRING MD:  Tegeler CHIEF COMPLAINT:  Dyspnea   Pending chart merger with chart w/ MRN  [id number]   History of Present Illness:   Kathleen Rodriguez is a 55 y.o. female who presented to ED 3/30 with progressive dyspnea x 3 days.  Fire was called first and administered a breathing neb which had minimal improvement.  EMS arrived, found hypoxic on room air at 85%, and placed on CPAP before treating with Magnesium, Solumedrol, DuoNeb and transporting to [facility] ED.  Patient is followed in our office by Dr. Craige Cotta.  She has been difficult to manage in the past.  Required intubation in 2016 for asthma exacerbation.  States this time of year is difficult for her due to seasonal allergies.  Last seen in the office 1/6.  At that time Virgel Bouquet was added to Symbicort, continued on Singulair and Claritin and paperwork started to start on Fasenra injections.  She was approved however did not show for her 3/7 appointment for Fasenra.  States she has limited transportation at home.  Has only been taking her Symbicort and as needed albuterol and has been out of her Singulair and Claritin.  Not taking Breo or using CPAP.  She recently called the office complaining of worsening shortness of breath and was placed on a steroid taper 3/14 in which patient states she completed but as soon as she finished, her symptoms returned.   In ED, she was switched to BiPAP due to increased WOB.  She informed EDP that she felt as if she were having a typical asthma exacerbation.  Does complain of some chest tightness.  Denied any recent fevers/chills/sweats, cough, N/V/D, abd pain, myalgias, exposures to known sick contacts.  Has received 1 COVID vaccine more then 6 months ago.  Did home Covid test 2 days ago which was negative.  Flu and COVID were negative.  CXR clear.   PCCM called in consultation given continued BiPAP needs/  ICU evaluation.  01/21/2021 no acute distress  Pertinent  Medical History:  Former smoker, uncontrolled persistent allergic asthma, OSA, noncompliant with CPAP, OA, IDA, hypertension, obesity, GERD Hx of cocaine abuse  Received 1 Covid vaccination Significant Hospital Events: Including procedures, antibiotic start and stop dates in addition to other pertinent events   3/30 admit.  Micro Data: Flu 3/30 > neg SARS CoV2 3/30 > neg.  Antibiotics: n/a  Interim History / Subjective:   55 year old female no acute distress oxygen is weaning no bronchospasm is appreciated. Objective:  Blood pressure 132/79, pulse 97, temperature 98.1 F (36.7 C), temperature source Oral, resp. rate 19, weight 112.9 kg, SpO2 98 %.    FiO2 (%):  [40 %] 40 %  No intake or output data in the 24 hours ending 01/21/21 0930 Filed Weights   01/21/21 0520  Weight: 112.9 kg    Examination: General: Obese female no acute distress HEENT: MM pink/moist no JVD lymphadenopathy is appreciated Neuro: Grossly intact other than lower extremity weakness CV:  PULM: Mild rhonchi bilaterally GI: soft, bsx4 active  GU: Extremities: warm/dry, 1+ edema  Skin: no rashes or lesions   Labs/imaging personally reviewed:  CXR 3/30 > neg.  Resolved Hospital Problem list:   Assessment & Plan:   Uncontrolled allergic Asthma with acute exacerbation.  In the setting of cocaine use  suspect she is non-compliant with her home regimen BiPAP has been discontinued Okay to D  intensify her care Transition from IV to oral steroids Continue bronchodilator therapy She has been approved for for Faserna injections but not taken as of yet Continue Singulair Claritin and Flonase Suggested she stop doing cocaine  OSA Noncompliant Suggested she become compliant with CPAP   Remainder per primary Pulmonary critical care will be available as needed Follow-up as an outpatient with Dr. Craige Cotta  Labs   CBC: Recent Labs  Lab  01/20/21 1545 01/21/21 0304  WBC 6.8 6.2  NEUTROABS 2.6 5.4  HGB 14.2 13.5  HCT 45.5 42.1  MCV 92.1 91.3  PLT 282 262    Basic Metabolic Panel: Recent Labs  Lab 01/20/21 1545 01/21/21 0304  NA 141 139  K 4.2 4.2  CL 107 106  CO2 28 26  GLUCOSE 109* 123*  BUN 10 11  CREATININE 0.93 0.74  CALCIUM 8.7* 9.2  MG  --  2.0  PHOS  --  3.4   GFR: CrCl cannot be calculated (Unknown ideal weight.). Recent Labs  Lab 01/20/21 1545 01/21/21 0304  WBC 6.8 6.2    Liver Function Tests: Recent Labs  Lab 01/20/21 1545 01/21/21 0304  AST 16 16  ALT 13 14  ALKPHOS 53 48  BILITOT 0.4 0.4  PROT 7.0 6.9  ALBUMIN 3.4* 3.3*   No results for input(s): LIPASE, AMYLASE in the last 168 hours. No results for input(s): AMMONIA in the last 168 hours.  ABG No results found for: PHART, PCO2ART, PO2ART, HCO3, TCO2, ACIDBASEDEF, O2SAT   Coagulation Profile: No results for input(s): INR, PROTIME in the last 168 hours.  Cardiac Enzymes: No results for input(s): CKTOTAL, CKMB, CKMBINDEX, TROPONINI in the last 168 hours.  HbA1C: Hgb A1c MFr Bld  Date/Time Value Ref Range Status  01/21/2021 03:04 AM 5.8 (H) 4.8 - 5.6 % Final    Comment:    (NOTE) Pre diabetes:          5.7%-6.4%  Diabetes:              >6.4%  Glycemic control for   <7.0% adults with diabetes     CBG: Recent Labs  Lab 01/20/21 2227 01/21/21 0014 01/21/21 0517 01/21/21 0808  GLUCAP 152* 174* 157* 79             Steve Maysoon Lozada ACNP Acute Care Nurse Practitioner [facility]/Critical [facility] Please consult Amion 01/21/2021, 9:31 AM

## 2021-01-21 NOTE — Progress Notes (Signed)
Pt ambulated with PT and OT on room air. Oxygen saturation remained >90% with no distress noted. Tolerated well.

## 2021-01-21 NOTE — Progress Notes (Signed)
OT Cancellation Note  Patient Details Name: Kathleen Rodriguez MRN: [id number] DOB: [birth year]   Cancelled Treatment:    Reason Eval/Treat Not Completed: Patient at procedure or test/ unavailable Occupational Therapy will check back as time allows for Evaluation.   Dynastee Brummell H., OTR/L Acute Rehabilitation  Raylinn Kosar Elane Bing Plume 01/21/2021, 9:47 AM

## 2021-01-21 NOTE — Progress Notes (Signed)
Pt is alert and oriented. Discharge instructions/ AVS given to pt. 

## 2021-01-21 NOTE — Discharge Summary (Signed)
Physician Discharge Summary  Atlee AbideLouise Mccall AVW:[id number] DOB: [birth year] DOA: 01/20/2021  PCP: Hoy RegisterNewlin, [name], MD  Admit date: 01/20/2021 Discharge date: 01/21/2021  Admitted From: Home Disposition: Home.  Recommendations for Outpatient Follow-up:  1. Follow up with PCP in 1-2 weeks 2. Follow-up with pulmonary clinic as scheduled.  Home Health: Not applicable Equipment/Devices: CPAP present at home  Discharge Condition: Stable CODE STATUS: Full code Diet recommendation: Low-salt diet  Discharge Summary: 55 year old female with severe persistent asthma presented to the ER with shortness of breath and wheezing, recent exacerbation and treated with steroid taper.  Symptomatic for 3 days.  She has sleep apnea and supposed to be on CPAP at home and does not use it consistently.  She also uses cocaine that definitely does not help with the bronchospasm.  Initially arrived with respiratory distress, steroids and nebulizer along with BiPAP transiently in the ER.  Also seen by pulmonary. Went to examine patient in the morning, she is walking around the hallway and asymptomatic.  She has been weaned off oxygen since 11 PM last night.  She is walking in the stairs.  Plan: Patient is adequately stabilized and she has to go home today as she is at risk of losing her housing.  Since he is a stabilized and currently asymptomatic, will discharge her with prolonged prednisone taper, she is already on bronchodilator therapy, she is on Pulmicort scheduled, refill sent.  Patient was also prescribed Singulair.  She has outpatient follow-up with pulmonary. Extensive counseling done to stop using cocaine. Extensive counseling done to use CPAP.  Patient understands.    Discharge Diagnoses:  Active Problems:   Asthma exacerbation   DM2 (diabetes mellitus, type 2) (HCC)   OSA (obstructive sleep apnea)   Essential hypertension   Obesity (BMI 30-39.9)   Cocaine abuse [facility](HCC)    Discharge  Instructions  Discharge Instructions    Call MD for:  difficulty breathing, headache or visual disturbances   Complete by: As directed    Diet - low sodium heart healthy   Complete by: As directed    Diet Carb Modified   Complete by: As directed    Discharge instructions   Complete by: As directed    Use medications as prescribed.  Use CPAP at night consistently. Do not use any cocaine that can exacerbate your asthma.   Increase activity slowly   Complete by: As directed    Increase activity slowly   Complete by: As directed      Allergies as of 01/21/2021      Reactions   Latex Hives, Rash   Tomato Hives, Rash      Medication List    TAKE these medications   albuterol 108 (90 Base) MCG/ACT inhaler Commonly known as: VENTOLIN HFA Inhale 1 puff into the lungs every 4 (four) hours as needed for wheezing or shortness of breath.   albuterol (2.5 MG/3ML) 0.083% nebulizer solution Commonly known as: PROVENTIL Take 2.5 mg by nebulization every 6 (six) hours as needed for shortness of breath or wheezing.   aspirin EC 81 MG tablet Take 81 mg by mouth daily. Swallow whole.   budesonide 0.25 MG/2ML nebulizer solution Commonly known as: PULMICORT Take 2 mLs (0.25 mg total) by nebulization 2 (two) times daily. What changed: when to take this   fluconazole 100 MG tablet Commonly known as: Diflucan Take 1 tablet (100 mg total) by mouth once for 1 dose.   gabapentin 600 MG tablet Commonly known as: NEURONTIN Take 600 mg by  mouth 3 (three) times daily.   loratadine 10 MG tablet Commonly known as: CLARITIN Take 10 mg by mouth daily.   metFORMIN 500 MG tablet Commonly known as: GLUCOPHAGE Take 500 mg by mouth 2 (two) times daily.   methocarbamol 500 MG tablet Commonly known as: ROBAXIN Take 500 mg by mouth 3 (three) times daily.   montelukast 10 MG tablet Commonly known as: SINGULAIR Take 1 tablet (10 mg total) by mouth at bedtime.   omeprazole 20 MG capsule Commonly  known as: PRILOSEC Take 20 mg by mouth daily.   predniSONE 10 MG tablet Commonly known as: DELTASONE 4 tabs daily for 3 days 3 tabs daily for 3 days 2 tabs daily for 3 days 1 tabs daily for 3 days       Follow-up Information    Hoy Register, MD Follow up in 2 week(s).   Specialty: Family Medicine Contact information: [address] [place] [address] [phone]              Allergies  Allergen Reactions  . Latex Hives and Rash  . Tomato Hives and Rash    Consultations:  Pulmonary critical care   Procedures/Studies: DG Chest Port 1 View  Result Date: 01/20/2021 CLINICAL DATA:  Shortness of breath EXAM: PORTABLE CHEST 1 VIEW COMPARISON:  July 02, 2020 FINDINGS: The heart size and mediastinal contours are within normal limits. Both lungs are clear. The visualized skeletal structures are unremarkable. IMPRESSION: No active disease. Electronically Signed   By: Jonna Clark M.D.   On: 01/20/2021 16:12   (Echo, Carotid, EGD, Colonoscopy, ERCP)    Subjective: Patient seen and examined.  She was taken off BiPAP since 10 PM last night.  Since midnight, she has not needed oxygen.  She is walking around the hallway.  She thinks her symptoms improved. She is requesting to be discharged because she is going to lose her housing otherwise.  Patient agrees to be compliant on medications and CPAP at home.    Discharge Exam: Vitals:   01/21/21 1012 01/21/21 1100  BP: (!) 119/107   Pulse: 94 91  Resp: 19 19  Temp: 98.1 F (36.7 C)   SpO2:  95%   Vitals:   01/21/21 0827 01/21/21 0945 01/21/21 1012 01/21/21 1100  BP:  (!) 119/107 (!) 119/107   Pulse:  94 94 91  Resp:  19 19 19   Temp:  98.1 F (36.7 C) 98.1 F (36.7 C)   TempSrc:  Oral Oral   SpO2: 98% 91%  95%  Weight:   112.6 kg   Height:   5\' 5"  (1.651 m)     General: Pt is alert, awake, not in acute distress Able to talk in complete sentences.  On room air.  Walking around. Cardiovascular: RRR,  S1/S2 +, no rubs, no gallops Respiratory: CTA bilaterally, no wheezing, no rhonchi Abdominal: Soft, NT, ND, bowel sounds + Extremities: no edema, no cyanosis    The results of significant diagnostics from this hospitalization (including imaging, microbiology, ancillary and laboratory) are listed below for reference.     Microbiology: Recent Results (from the past 240 hour(s))  Resp Panel by RT-PCR (Flu A&B, Covid) Nasopharyngeal Swab     Status: None   Collection Time: 01/20/21  4:08 PM   Specimen: Nasopharyngeal Swab; Nasopharyngeal(NP) swabs in vial transport medium  Result Value Ref Range Status   SARS Coronavirus 2 by RT PCR NEGATIVE NEGATIVE Final    Comment: (NOTE) SARS-CoV-2 target nucleic acids are  NOT DETECTED.  The SARS-CoV-2 RNA is generally detectable in upper respiratory specimens during the acute phase of infection. The lowest concentration of SARS-CoV-2 viral copies this assay can detect is 138 copies/mL. A negative result does not preclude SARS-Cov-2 infection and should not be used as the sole basis for treatment or other patient management decisions. A negative result may occur with  improper specimen collection/handling, submission of specimen other than nasopharyngeal swab, presence of viral mutation(s) within the areas targeted by this assay, and inadequate number of viral copies(<138 copies/mL). A negative result must be combined with clinical observations, patient history, and epidemiological information. The expected result is Negative.  Fact Sheet for Patients:  BloggerCourse.com  Fact Sheet for Healthcare Providers:  SeriousBroker.it  This test is no t yet approved or cleared by the [place] FDA and  has been authorized for detection and/or diagnosis of SARS-CoV-2 by FDA under an Emergency Use Authorization (EUA). This EUA will remain  in effect (meaning this test can be used) for the duration of  the COVID-19 declaration under Section 564(b)(1) of the Act, 21 U.S.C.section 360bbb-3(b)(1), unless the authorization is terminated  or revoked sooner.       Influenza A by PCR NEGATIVE NEGATIVE Final   Influenza B by PCR NEGATIVE NEGATIVE Final    Comment: (NOTE) The Xpert Xpress SARS-CoV-2/FLU/RSV plus assay is intended as an aid in the diagnosis of influenza from Nasopharyngeal swab specimens and should not be used as a sole basis for treatment. Nasal washings and aspirates are unacceptable for Xpert Xpress SARS-CoV-2/FLU/RSV testing.  Fact Sheet for Patients: BloggerCourse.com  Fact Sheet for Healthcare Providers: SeriousBroker.it  This test is not yet approved or cleared by the [place] FDA and has been authorized for detection and/or diagnosis of SARS-CoV-2 by FDA under an Emergency Use Authorization (EUA). This EUA will remain in effect (meaning this test can be used) for the duration of the COVID-19 declaration under Section 564(b)(1) of the Act, 21 U.S.C. section 360bbb-3(b)(1), unless the authorization is terminated or revoked.  Performed at [facility], [address] [place], [place] [address]      Labs: BNP (last 3 results) No results for input(s): BNP in the last 8760 hours. Basic Metabolic Panel: Recent Labs  Lab 01/20/21 1545 01/21/21 0304  NA 141 139  K 4.2 4.2  CL 107 106  CO2 28 26  GLUCOSE 109* 123*  BUN 10 11  CREATININE 0.93 0.74  CALCIUM 8.7* 9.2  MG  --  2.0  PHOS  --  3.4   Liver Function Tests: Recent Labs  Lab 01/20/21 1545 01/21/21 0304  AST 16 16  ALT 13 14  ALKPHOS 53 48  BILITOT 0.4 0.4  PROT 7.0 6.9  ALBUMIN 3.4* 3.3*   No results for input(s): LIPASE, AMYLASE in the last 168 hours. No results for input(s): AMMONIA in the last 168 hours. CBC: Recent Labs  Lab 01/20/21 1545 01/21/21 0304  WBC 6.8 6.2  NEUTROABS 2.6 5.4  HGB 14.2 13.5  HCT 45.5 42.1   MCV 92.1 91.3  PLT 282 262   Cardiac Enzymes: No results for input(s): CKTOTAL, CKMB, CKMBINDEX, TROPONINI in the last 168 hours. BNP: Invalid input(s): POCBNP CBG: Recent Labs  Lab 01/20/21 2227 01/21/21 0014 01/21/21 0517 01/21/21 0808 01/21/21 1143  GLUCAP 152* 174* 157* 79 114*   D-Dimer No results for input(s): DDIMER in the last 72 hours. Hgb A1c Recent Labs    01/21/21 0304  HGBA1C 5.8*  Lipid Profile No results for input(s): CHOL, HDL, LDLCALC, TRIG, CHOLHDL, LDLDIRECT in the last 72 hours. Thyroid function studies Recent Labs    01/21/21 0304  TSH 0.446   Anemia work up No results for input(s): VITAMINB12, FOLATE, FERRITIN, TIBC, IRON, RETICCTPCT in the last 72 hours. Urinalysis No results found for: COLORURINE, APPEARANCEUR, LABSPEC, PHURINE, GLUCOSEU, HGBUR, BILIRUBINUR, KETONESUR, PROTEINUR, UROBILINOGEN, NITRITE, LEUKOCYTESUR Sepsis Labs Invalid input(s): PROCALCITONIN,  WBC,  LACTICIDVEN Microbiology Recent Results (from the past 240 hour(s))  Resp Panel by RT-PCR (Flu A&B, Covid) Nasopharyngeal Swab     Status: None   Collection Time: 01/20/21  4:08 PM   Specimen: Nasopharyngeal Swab; Nasopharyngeal(NP) swabs in vial transport medium  Result Value Ref Range Status   SARS Coronavirus 2 by RT PCR NEGATIVE NEGATIVE Final    Comment: (NOTE) SARS-CoV-2 target nucleic acids are NOT DETECTED.  The SARS-CoV-2 RNA is generally detectable in upper respiratory specimens during the acute phase of infection. The lowest concentration of SARS-CoV-2 viral copies this assay can detect is 138 copies/mL. A negative result does not preclude SARS-Cov-2 infection and should not be used as the sole basis for treatment or other patient management decisions. A negative result may occur with  improper specimen collection/handling, submission of specimen other than nasopharyngeal swab, presence of viral mutation(s) within the areas targeted by this assay, and  inadequate number of viral copies(<138 copies/mL). A negative result must be combined with clinical observations, patient history, and epidemiological information. The expected result is Negative.  Fact Sheet for Patients:  BloggerCourse.com  Fact Sheet for Healthcare Providers:  SeriousBroker.it  This test is no t yet approved or cleared by the [place] FDA and  has been authorized for detection and/or diagnosis of SARS-CoV-2 by FDA under an Emergency Use Authorization (EUA). This EUA will remain  in effect (meaning this test can be used) for the duration of the COVID-19 declaration under Section 564(b)(1) of the Act, 21 U.S.C.section 360bbb-3(b)(1), unless the authorization is terminated  or revoked sooner.       Influenza A by PCR NEGATIVE NEGATIVE Final   Influenza B by PCR NEGATIVE NEGATIVE Final    Comment: (NOTE) The Xpert Xpress SARS-CoV-2/FLU/RSV plus assay is intended as an aid in the diagnosis of influenza from Nasopharyngeal swab specimens and should not be used as a sole basis for treatment. Nasal washings and aspirates are unacceptable for Xpert Xpress SARS-CoV-2/FLU/RSV testing.  Fact Sheet for Patients: BloggerCourse.com  Fact Sheet for Healthcare Providers: SeriousBroker.it  This test is not yet approved or cleared by the [place] FDA and has been authorized for detection and/or diagnosis of SARS-CoV-2 by FDA under an Emergency Use Authorization (EUA). This EUA will remain in effect (meaning this test can be used) for the duration of the COVID-19 declaration under Section 564(b)(1) of the Act, 21 U.S.C. section 360bbb-3(b)(1), unless the authorization is terminated or revoked.  Performed at [facility], [address] [place], [place] [address]      Time coordinating discharge:  32 minutes  SIGNED:   Dorcas Carrow, MD  [facility] 01/21/2021, 11:58 AM

## 2021-01-21 NOTE — Evaluation (Signed)
Physical Therapy Evaluation Patient Details Name: Kathleen Rodriguez MRN: [id number] DOB: 07-02-1966 Today's Date: 01/21/2021   History of Present Illness  55 y.o. female with PMHx of DM2, HTN, Asthma, OSA on CPAP, non-compliance obesity, GERD, substance abuse. Presented with   3 days of weezing very short of breath EMS was called  Initially satting 85% on room air by EMS.  Started on BiPAP.  Hx of severe asthma with prior intubations in 2016. Had a phone call with pulmonologist Dr. Craige Cotta couple weeks ago and was prescribed prednisone taper.  She reached the end of her taper and started given worsening SOB.  Last seen in the office 1/6 & Virgel Bouquet was added to Symbicort. Continued on Singulair and Claritin and paperwork started to start on Fasenra injections.  Supposed to start Harrington Challenger this month (March) but missed last appointment  She has only been taking her Symbicort has been out of her Singulair and Claritin.  Has not been using the CPAP or using her Breo at home.  Patient states that her asthma has gotten really bad since she moved from [place] to [facility]. Has a lot of allergic components to it.  Clinical Impression  Pt presented near baseline with no observable deficits in functional mobility requiring skill PT intervention. Pt mobilizing at a modified independent level and able to perform all mobility required in the home without assistance.  Pt vitals remained stable throughout ambulation with minimal SOB reported/observed. Pt encouraged to ambulate multiple times a day during admission. PT signing off on D/C.    Follow Up Recommendations No PT follow up    Equipment Recommendations  None recommended by PT    Recommendations for Other Services       Precautions / Restrictions Precautions Precautions: Fall Restrictions Weight Bearing Restrictions: No      Mobility  Bed Mobility Overal bed mobility: Independent                  Transfers Overall transfer level: Independent Equipment used:  None                Ambulation/Gait Ambulation/Gait assistance: Modified independent (Device/Increase time) Gait Distance (Feet): 300 Feet Assistive device: IV Pole (pt. claims reports she needs to hold the IV stand to ambulate due to knee problems) Gait Pattern/deviations: WFL(Within Functional Limits)   Gait velocity interpretation: 1.31 - 2.62 ft/sec, indicative of limited community ambulator    Stairs Stairs: Yes Stairs assistance: Modified independent (Device/Increase time) Stair Management: One rail Right;One rail Left;Step to pattern (R rail ascending, L rail descending) Number of Stairs: 5 General stair comments: number of stairs limited by IV length, pt. defers multiple trials of stairs  Wheelchair Mobility    Modified Rankin (Stroke Patients Only)       Balance Overall balance assessment: No apparent balance deficits (not formally assessed)                                           Pertinent Vitals/Pain Pain Assessment: Faces Faces Pain Scale: Hurts a little bit Pain Location: knee (pt. reported minimal pain in chest) Pain Descriptors / Indicators: Aching Pain Intervention(s): Monitored during session    Home Living Family/patient expects to be discharged to:: Private residence Living Arrangements: Spouse/significant other;Children Available Help at Discharge: Family;Available 24 hours/day (pt. reported either her husband or son could be available 24/7 if needed) Type of Home: Apartment  Home Access: Stairs to enter Entrance Stairs-Rails: Can reach both Entrance Stairs-Number of Steps: 3rd floor Home Layout: One level Home Equipment: None      Prior Function Level of Independence: Independent         Comments: pt. reports not leaving the house often     Hand Dominance        Extremity/Trunk Assessment   Upper Extremity Assessment Upper Extremity Assessment: Overall WFL for tasks assessed    Lower Extremity  Assessment Lower Extremity Assessment: Overall WFL for tasks assessed    Cervical / Trunk Assessment Cervical / Trunk Assessment: Normal  Communication   Communication: No difficulties  Cognition Arousal/Alertness: Awake/alert Behavior During Therapy: WFL for tasks assessed/performed Overall Cognitive Status: Within Functional Limits for tasks assessed                                        General Comments      Exercises     Assessment/Plan    PT Assessment Patent does not need any further PT services  PT Problem List         PT Treatment Interventions      PT Goals (Current goals can be found in the Care Plan section)       Frequency     Barriers to discharge        Co-evaluation               AM-PAC PT "6 Clicks" Mobility  Outcome Measure Help needed turning from your back to your side while in a flat bed without using bedrails?: None Help needed moving from lying on your back to sitting on the side of a flat bed without using bedrails?: None Help needed moving to and from a bed to a chair (including a wheelchair)?: None Help needed standing up from a chair using your arms (e.g., wheelchair or bedside chair)?: None Help needed to walk in hospital room?: None Help needed climbing 3-5 steps with a railing? : None 6 Click Score: 24    End of Session Equipment Utilized During Treatment: Gait belt Activity Tolerance: Patient tolerated treatment well;Patient limited by fatigue Patient left: with call bell/phone within reach;in bed (EOB) Nurse Communication: Mobility status      Time: 0998-3382 PT Time Calculation (min) (ACUTE ONLY): 22 min   Charges:   PT Evaluation $PT Eval Low Complexity: 1 Low          Dyke Maes, SPT Pager #: [phone]  Ilona Sorrel 01/21/2021, 12:26 PM

## 2021-01-21 NOTE — TOC CAGE-AID Note (Signed)
Transition of Care [facility]) - CAGE-AID Screening   Patient Details  Name: Kathleen Rodriguez MRN: [id number] Date of Birth: [birth year]  [facility]) CM/SW Contact:    Trula Ore, [place] Phone Number: 01/21/2021, 12:46 PM   Patient declined. CSW received consult for Substance use resources. CSW met with patient at bedside. Patient declined resources. Patient states that substance was given to her in a cookie. Patient states no current substance use.  Clinical Narrative:    CAGE-AID Screening: Substance Abuse Screening unable to be completed due to: : Patient Refused (patient states is not substance user)             Substance Abuse Education Offered: Yes (patient declined)  Substance abuse interventions: Scientist, clinical (histocompatibility and immunogenetics) (patient declined)

## 2021-01-21 NOTE — TOC Transition Note (Addendum)
Transition of Care [facility]) - CM/SW Discharge Note   Patient Details  Name: Kathleen Rodriguez MRN: [id number] Date of Birth: 05-23-1966  Transition of [facility]) CM/SW Contact:  Terrial Rhodes, LCSWA Phone Number: 01/21/2021, 11:16 AM   Clinical Narrative:     Patient confirmed with CSW that she has a current PCP. [facility] to [facility]. Her PCP is Dr. Alvis Lemmings. Patient plans on seeing Dr. Alvis Lemmings on April 7th.  Patient will DC YY:FRTM: [address]  [place] [address]  Anticipated DC date: 01/21/2021  Family notified: Thurmond Butts   Transport by: Maretta Los  ?  Per MD patient ready for DC to home . RN, patient, and patient's family notified of DC. [facility] transport requested for patient.  CSW signing off.          Patient Goals and CMS Choice        Discharge Placement                       Discharge Plan and Services                                     Social Determinants of Health (SDOH) Interventions     Readmission Risk Interventions No flowsheet data found.

## 2021-01-21 NOTE — Evaluation (Signed)
Occupational Therapy Evaluation Patient Details Name: Kathleen Rodriguez MRN: [id number] DOB: 07-24-1966 Today's Date: 01/21/2021    History of Present Illness 55 y.o. female with PMHx of DM2, HTN, Asthma, OSA on CPAP, non-compliance obesity, GERD, substance abuse. Presented with   3 days of weezing very short of breath EMS was called  Initially satting 85% on room air by EMS.  Started on BiPAP.  Hx of severe asthma with prior intubations in 2016. Had a phone call with pulmonologist Kathleen Rodriguez couple weeks ago and was prescribed prednisone taper.  She reached the end of her taper and started given worsening SOB.  Last seen in the office 1/6 & Kathleen Rodriguez was added to Symbicort. Continued on Singulair and Claritin and paperwork started to start on Fasenra injections.  Supposed to start Kathleen Rodriguez this month (March) but missed last appointment  She has only been taking her Symbicort has been out of her Singulair and Claritin.  Has not been using the CPAP or using her Breo at home.  Patient states that her asthma has gotten really bad since she moved from [place] to [facility] Has a lot of allergic components to it.   Clinical Impression   Pt received supine in bed w/ HOB elevated, agreeable to OT evaluation. Pt reported that prior to this admission, she was independent at home with all functional mobility, ADL's, and IADL's. Pt reported using a RW intermittently due to R knee pain caused by bone on bone joint. At the time of the evaluation, Pt demonstrated continued Modified Independence with RW as needed. Pt is able to complete all self care activities as needed w/out Assistance. Pt will not need acute OT services at this time and will be discharged from OT.     Follow Up Recommendations  No OT follow up    Equipment Recommendations  None recommended by OT    Recommendations for Other Services       Precautions / Restrictions Restrictions Weight Bearing Restrictions: No      Mobility Bed Mobility Overal bed mobility:  Modified Independent             General bed mobility comments: Pt used bed rails and had HOB elevated    Transfers Overall transfer level: Modified independent Equipment used: Rolling walker (2 wheeled)             General transfer comment: Pt started functional mobility with RW, on return from bathroom, pt was carrying the walker in stead of using it.    Balance Overall balance assessment: Modified Independent                                         ADL either performed or assessed with clinical judgement   ADL Overall ADL's : Needs assistance/impaired     Grooming: Wash/dry hands;Modified independent;Standing Grooming Details (indicate cue type and reason): Pt standing at sink.                 Toilet Transfer: Modified Designer, fashion/clothing Details (indicate cue type and reason): Pt used RW initially upon entering bathroom, when exiting bathroom, pt was carrying RW to the sink.           General ADL Comments: Pt able to maintain safety and complete all self care as needed.     Vision   Vision Assessment?: No apparent visual deficits     Perception Perception  Perception Tested?: No   Praxis Praxis Praxis tested?: Not tested    Pertinent Vitals/Pain Pain Assessment: 0-10 Pain Score: 8  Faces Pain Scale: Hurts a little bit Pain Location: R knee Pain Descriptors / Indicators: Aching Pain Intervention(s): Monitored during session;Repositioned     Hand Dominance     Extremity/Trunk Assessment Upper Extremity Assessment Upper Extremity Assessment: Overall WFL for tasks assessed   Lower Extremity Assessment Lower Extremity Assessment: Overall WFL for tasks assessed   Cervical / Trunk Assessment Cervical / Trunk Assessment: Normal   Communication Communication Communication: No difficulties   Cognition Arousal/Alertness: Awake/alert Behavior During Therapy: WFL for tasks assessed/performed Overall Cognitive  Status: Within Functional Limits for tasks assessed                                     General Comments  Pt O2 stats remained in the mid to low 90's throughout functional mobility in the room on RA.    Exercises     Shoulder Instructions      Home Living Family/patient expects to be discharged to:: Private residence Living Arrangements: Spouse/significant other;Children Available Help at Discharge: Family;Available 24 hours/day Type of Home: Apartment Home Access: Stairs to enter [employer] of Steps: 3rd floor Entrance Stairs-Rails: Can reach both Home Layout: One level         Bathroom Toilet: Standard     Home Equipment: None          Prior Functioning/Environment Level of Independence: Independent                 OT Problem List:        OT Treatment/Interventions:      OT Goals(Current goals can be found in the care plan section) Acute Rehab OT Goals Patient Stated Goal: to return home OT Goal Formulation: All assessment and education complete, DC therapy  OT Frequency:     Barriers to D/C:            Co-evaluation              AM-PAC OT "6 Clicks" Daily Activity     Outcome Measure Help from another person eating meals?: None Help from another person taking care of personal grooming?: None Help from another person toileting, which includes using toliet, bedpan, or urinal?: None Help from another person bathing (including washing, rinsing, drying)?: None Help from another person to put on and taking off regular upper body clothing?: None Help from another person to put on and taking off regular lower body clothing?: None 6 Click Score: 24   End of Session Equipment Utilized During Treatment: Gait belt;Rolling walker Nurse Communication: Mobility status;Other (comment) (Pt O2 sats with functional mobility.)  Activity Tolerance: Patient tolerated treatment well Patient left: in chair;with call bell/phone within  reach  OT Visit Diagnosis: Muscle weakness (generalized) (M62.81)                Time: 1020-1035 OT Time Calculation (min): 15 min Charges:  OT General Charges $OT Visit: 1 Visit OT Evaluation $OT Eval Low Complexity: 1 Low  Kathleen Depaul H., OTR/L Acute Rehabilitation  Shianna Bally Elane Madge Therrien 01/21/2021, 1:43 PM

## 2021-01-22 ENCOUNTER — Other Ambulatory Visit: Payer: Self-pay | Admitting: Family Medicine

## 2021-01-22 ENCOUNTER — Telehealth: Payer: Self-pay

## 2021-01-22 ENCOUNTER — Encounter: Payer: Self-pay | Admitting: Podiatrist

## 2021-01-22 ENCOUNTER — Telehealth: Payer: Self-pay | Admitting: Family Medicine

## 2021-01-22 DIAGNOSIS — R7303 Prediabetes: Secondary | ICD-10-CM

## 2021-01-22 NOTE — Telephone Encounter (Signed)
Transition Care Management Follow-up Telephone Call  Date of discharge and from where: 01/21/2021 from [facility]  How have you been since you were released from the hospital? Pt stated   Any questions or concerns? No  Items Reviewed:  Did the pt receive and understand the discharge instructions provided? Yes   Medications obtained and verified? Yes   Other? No   Any new allergies since your discharge? No   Dietary orders reviewed? Low salt diet  Do you have support at home? Yes   Functional Questionnaire: (I = Independent and D = Dependent) ADLs: I  Bathing/Dressing- I  Meal Prep- I  Eating- I  Maintaining continence- I  Transferring/Ambulation- I  Managing Meds- I  Follow up appointments reviewed:   PCP Hospital f/u appt confirmed? No  .  Specialist Hospital f/u appt confirmed? Yes  Scheduled to see Dr. Craige Cotta on 01/27/2021 @ 09:15am.  Are transportation arrangements needed? No   If their condition worsens, is the pt aware to call PCP or go to the Emergency Dept.? Yes  Was the patient provided with contact information for the PCP's office or ED? Yes  Was to pt encouraged to call back with questions or concerns? Yes

## 2021-01-22 NOTE — Telephone Encounter (Signed)
Attempted to reach Ms.Branscomb today to get her rescheduled for her phone visit with the [facility] on the Managed Medicaid Team. I was told she was unavailable. I will reach out again in the next 7-14 days.

## 2021-01-22 NOTE — Telephone Encounter (Signed)
Transition Care Management Unsuccessful Follow-up Telephone Call  Date of discharge and from where:  [facility] on 01/21/2021  Attempts:  1st Attempt  Reason for unsuccessful TCM follow-up call:  Unable to reach patient at listed phone [phone] .person answered stated she is not home at the moment and call back later . Will attempt again another time.

## 2021-01-25 ENCOUNTER — Telehealth: Payer: Self-pay

## 2021-01-25 NOTE — Telephone Encounter (Signed)
Transition Care Management Follow-up Telephone Call  Date of discharge and from where:[facility] on 01/21/2021 How have you been since you were released from the hospital? Feeling good Any questions or concerns? No questions/concerns reported.  Items Reviewed: Did the pt receive and understand the discharge instructions provided? Pt have the instructions and have no questions at this time.  Medications obtained and verified?Pt have Medication list  and  medications were reviewed and they have no questions.  Any new allergies since your discharge? None reported  Do you have support at home? Yes son helps  Other (ie: DME, Home Health, etc)       Functional Questionnaire: (I = Independent and D = Dependent) ADL's:  Independent.      Follow up appointments reviewed:   PCP Hospital f/u appt confirmed? Dr Alvis Lemmings on 02/18/21 Specialist Hospital f/u appt confirmed? Appt  scheduled   Are transportation arrangements needed?Pt have transportation   If their condition worsens, is the pt aware to call  their PCP or go to the ED? Yes.Made pt aware if condition worsen or start experiencing rapid weight gain, chest pain, diff breathing, SOB, high fevers, or bleading to refer imediately to ED for further evaluation.  Was the patient provided with contact information for the PCP's office or ED? He has the phone number  Was the pt encouraged to call back with questions or concerns?yes

## 2021-01-27 ENCOUNTER — Ambulatory Visit: Payer: Medicaid Other | Admitting: Pulmonary Disease

## 2021-01-29 ENCOUNTER — Ambulatory Visit: Payer: Medicaid Other | Admitting: Pharmacist

## 2021-02-02 ENCOUNTER — Ambulatory Visit: Payer: Medicaid Other | Admitting: *Deleted

## 2021-02-02 ENCOUNTER — Other Ambulatory Visit: Payer: Self-pay | Admitting: Obstetrics and Gynecology

## 2021-02-02 ENCOUNTER — Telehealth: Payer: Self-pay | Admitting: *Deleted

## 2021-02-02 NOTE — Patient Outreach (Signed)
Care Coordination  02/02/2021  LAMOYNE PALENCIA 03-Sep-1966 [id number]    Medicaid Managed Care   Unsuccessful Outreach Note  02/02/2021 Name: MAIYAH GOYNE MRN: [id number] DOB: 03-12-1966  Referred by: Hoy Register, MD Reason for referral : High Risk Managed Medicaid (Unsuccessful telephone outreach)   A second unsuccessful telephone outreach was attempted today. The patient was referred to the case management team for assistance with care management and care coordination.   Follow Up Plan: A member of the Managed Medicaid care management team will reach out to the patient again over the next 7 days.   [name] RN, BSN [place]  [employer] - Managed Medicaid High Risk [phone].

## 2021-02-02 NOTE — Patient Instructions (Signed)
  Hi Ms. Eastwood, sorry we missed you today  - as a part of your Medicaid benefit, you are eligible for care management and care coordination services at no cost or copay. I was unable to reach you by phone today but would be happy to help you with your health related needs. Please feel free to call me at [phone].  A member of the Managed Medicaid care management team will reach out to you again over the next 7 days.   [name] RN, BSN [place]  [employer] - Managed Medicaid High Risk [phone].

## 2021-02-02 NOTE — Telephone Encounter (Signed)
Patient no showed PV today- Called patient at 332 pm for her PV , a gentleman told me pt had stepped out and was not available - asked me to call back in 5 minutes- I called pt at 341 pm  And got an answering machine  and left message to return call by 5 pm today- If no call by 5 pm, PV and procedure will be canceled - no call at 5 pm -  PV and Procedure both canceled- No Show letter mailed to patient

## 2021-02-08 ENCOUNTER — Telehealth: Payer: Self-pay | Admitting: Pulmonary Disease

## 2021-02-08 NOTE — Telephone Encounter (Signed)
Patient changed over to [facility] and stated that the pharmacy told patient the Virgel Bouquet was not covered by this insurance. Filed PA, last seen by Rubye Oaks NP on 10/29/2020. Put Dr Craige Cotta on PA who is primary MD     Also patient originally called to ask about changing office visit on 03/05/2021 to televisit but when writer spoke with patient, patient stated she was fine coming to appointment and was just checking and did not have any specific reason for televisit. Patient stated office visit is ok.   PA request was received from (pharmacy): [facility] [address] Phone: [phone] Fax:  Medication name and strength: Breo 200 Ordering Provider: Andrey Campanile NP, put Dr Craige Cotta on PA Was PA started with [facility]?: Yes If yes, please enter KEY: [id number] Medication tried and failed: Symbicort, brovana, proair, pulmicort Covered Alternatives:   PA sent to plan, time frame for approval / denial: 24-72 hours Routing to Victorino Dike, Dr Evlyn Courier nurse for follow up

## 2021-02-10 ENCOUNTER — Ambulatory Visit: Payer: Medicaid Other | Admitting: Family Medicine

## 2021-02-10 NOTE — Telephone Encounter (Signed)
Checked status of PA on [email or web address]. PA for Virgel Bouquet was denied. PA was denied due to the insurance not having any records of the patient trying and failing two preferred medications. Reviewed patient's chart, she has tried and failed both Symbicort and Dulera. Will submit an appeal.   Called [employer] at [phone] and spoke with an advocate. Provided them with the updated information for the appeal. Was then told that the appeal could not be done via phone. Will fax a letter to [facility] to appeal the decision.   Will await the appeal decision.

## 2021-02-11 ENCOUNTER — Telehealth: Payer: Self-pay | Admitting: Pulmonary Disease

## 2021-02-11 MED ORDER — PREDNISONE 10 MG PO TABS: ORAL_TABLET | ORAL | 0 refills | Status: DC

## 2021-02-11 NOTE — Telephone Encounter (Signed)
I have called the pt and she is aware of the prednisone that has been sent to the pharmacy.  Nothing further is needed.

## 2021-02-11 NOTE — Telephone Encounter (Signed)
Prednisone 10 mg, # 20, 4 X 2 DAYS, 3 X 2 DAYS, 2 X 2 DAYS, 1 X 2 DAYS  

## 2021-02-11 NOTE — Telephone Encounter (Signed)
Called and spoke with pt and she stated that her kids have been sick and she has tried to stay away from them, but she is now having a flare of her asthma.  She stated that she has been having to use her nebulizer more often and she is [facility].  She is requesting that something be sent in to the pharmacy to help get this cleared up so she does not end up in the hospital.  VS is not in the office today.  Will forward to CY to advise. Thanks  Allergies  Allergen Reactions  . Tomato Hives, Itching and Other (See Comments)    ALSO REACTS TO KETCHUP  . Latex Itching and Rash  . Latex Hives and Rash  . Tomato Hives and Rash  . Wool Alcohol [Lanolin] Itching     Last ov--10/29/20 with TP Next ov--03/05/2021

## 2021-02-12 NOTE — Telephone Encounter (Signed)
Checked to see if we had received a response back on PA and no response has been received yet.

## 2021-02-15 ENCOUNTER — Telehealth: Payer: Self-pay | Admitting: Family Medicine

## 2021-02-15 NOTE — Telephone Encounter (Signed)
Please see encounter

## 2021-02-15 NOTE — Telephone Encounter (Signed)
Pt is calling to request a letter stating that is stating that it is ok to give plasma. And pt needs a dx for the inflammation on her left leg. Pt is requesting letter to be faxed to [phone] Pt also states that the letter needs to be stamped Please advise CB- [phone]

## 2021-02-15 NOTE — Telephone Encounter (Signed)
Pt is requesting letter to donate plasma.

## 2021-02-15 NOTE — Telephone Encounter (Signed)
Called [employer] at [phone] and spoke with Victorino Dike in the PA department for the pharmacy.  She stated she did not have access to the information for appeals and it could take up to 30 days for a decision, but could call [phone], # located in the denial to get an update.  Called [phone] and spoke with an advocate in appeals, Dewayne Hatch, the appeal is still in the inquiry state.  The case ID is [id number] and may take up to 30 days, given the date of May 20th.  Advised to give it some time before checking back.

## 2021-02-16 ENCOUNTER — Telehealth: Payer: Self-pay | Admitting: Family Medicine

## 2021-02-16 NOTE — Telephone Encounter (Signed)
Pt would like provider to sign the Consent for Healthcare Provider to Release Medical records paperwork. Pt informed me that she would the form filled ASAP for by her upcoming appt on Thursday. Please follow up

## 2021-02-16 NOTE — Telephone Encounter (Signed)
Letter has been written.  Please fax.  Thank you

## 2021-02-18 ENCOUNTER — Telehealth: Payer: Self-pay

## 2021-02-18 ENCOUNTER — Ambulatory Visit: Payer: Medicaid Other | Admitting: Family Medicine

## 2021-02-18 ENCOUNTER — Encounter: Payer: Medicaid Other | Admitting: Gastroenterology

## 2021-02-18 NOTE — Telephone Encounter (Signed)
Copied from CRM [id number]. Topic: Quick Communication - Appointment Cancellation >> Feb 18, 2021  9:58 AM Kathleen Rodriguez, [name] D wrote: Patient called to cancel appointment scheduled for 02/18/21 @ 10:50 due to no transportation because they cancel on her. Patient has not rescheduled their appointment. Also patient would like to know if her paperwork for plasma has been filled out. Please call patient back, thanks.  Route to department's PEC pool.

## 2021-02-19 NOTE — Telephone Encounter (Signed)
Letter will not be sent to patient due to needing a DX from her dermatologist.  Patient has been informed to contact her dermatologist to get a DX then PCP will be able to fill out paperwork.

## 2021-02-19 NOTE — Telephone Encounter (Signed)
Will check back closer to 03/12/21

## 2021-02-23 ENCOUNTER — Telehealth: Payer: Self-pay | Admitting: Family Medicine

## 2021-02-23 NOTE — Telephone Encounter (Signed)
Pt has called back again asking the same info about a dermatologist/ cb [phone]

## 2021-02-23 NOTE — Telephone Encounter (Signed)
Pt states that she never went to a dermatologist for her legs. Please follow up.

## 2021-02-23 NOTE — Telephone Encounter (Signed)
Copied from CRM [id number]. Topic: General - Inquiry >> Feb 22, 2021 12:34 PM Aretta Nip wrote: Pt states needs an diagnosis of the break out she had on her leg in 2020. States that she is trying to give blood and they have to have the name of what Dr Alvis Lemmings treated her for or will not continue to let her give blood. States did not go to a dermatologist. Wants a cb at [phone]

## 2021-02-24 ENCOUNTER — Other Ambulatory Visit: Payer: Self-pay | Admitting: Obstetrics and Gynecology

## 2021-02-24 ENCOUNTER — Other Ambulatory Visit: Payer: Self-pay

## 2021-02-24 DIAGNOSIS — J4551 Severe persistent asthma with (acute) exacerbation: Secondary | ICD-10-CM

## 2021-02-24 NOTE — Patient Outreach (Signed)
Medicaid Managed Care   Nurse Care Manager Note  02/24/2021 Name:  Kathleen Rodriguez MRN:  [id number] DOB:  1965-12-01  Kathleen Rodriguez is an 55 y.o. year old female who is a primary patient of Kathleen Rakes, MD.  The [facility] Managed Care Coordination team was consulted for assistance with:    chronic healthcare management needs.  Kathleen Rodriguez was given information about Medicaid Managed Care Coordination team services today. Kathleen Rodriguez agreed to services and verbal consent obtained.  Engaged with patient by telephone for initial visit in response to provider referral for case management and/or care coordination services.   Assessments/Interventions:  Review of past medical history, allergies, medications, health status, including review of consultants reports, laboratory and other test data, was performed as part of comprehensive evaluation and provision of chronic care management services.  SDOH (Social Determinants of Health) assessments and interventions performed: SDOH Interventions   Flowsheet Row Most Recent Value  SDOH Interventions   Transportation Interventions [facility]      Care Plan  Allergies  Allergen Reactions  . Tomato Hives, Itching and Other (See Comments)    ALSO REACTS TO KETCHUP  . Latex Itching and Rash  . Latex Hives and Rash  . Tomato Hives and Rash  . Wool Alcohol [Lanolin] Itching    Medications Reviewed Today    Reviewed by Kathleen Medicus, RN (Registered Nurse) on 02/24/21 at Mobile List Status: <None>  Medication Order Taking? Sig Documenting Provider Last Dose Status Informant  acetaminophen (TYLENOL) 500 MG tablet [id number] Yes Take 500 mg by mouth every 6 (six) hours as needed for mild pain. [provider] Taking Active Self  albuterol (PROVENTIL) (2.5 MG/3ML) 0.083% nebulizer solution [id number] Yes Take 3 mLs (2.5 mg total) by nebulization every 6 (six) hours as needed for wheezing or shortness of breath. Chesley Mires, MD Taking Active   albuterol (PROVENTIL) (2.5 MG/3ML) 0.083% nebulizer solution [id number] No Take 2.5 mg by nebulization every 6 (six) hours as needed for shortness of breath or wheezing.  Patient not taking: Reported on 02/24/2021   [provider] Not Taking Active Self  albuterol (VENTOLIN HFA) 108 (90 Base) MCG/ACT inhaler [id number] Yes Inhale 1 puff into the lungs every 4 (four) hours as needed for wheezing or shortness of breath. [provider] Taking Active Self  aspirin 81 MG EC tablet [id number] Yes Take 1 tablet (81 mg total) by mouth daily. Kathleen Rakes, MD Taking Active Self  aspirin EC 81 MG tablet [id number] No Take 81 mg by mouth daily. Swallow whole.  Patient not taking: Reported on 02/24/2021   [provider] Not Taking Active Self  Blood Glucose Monitoring Suppl (ACCU-CHEK GUIDE ME) w/Device KIT [id number] No 1 each by Does not apply route daily. Dx; Prediabetets; Prednisone use  Patient not taking: Reported on 02/24/2021   Kathleen Rakes, MD Not Taking Active Self  budesonide (PULMICORT) 0.25 MG/2ML nebulizer solution [id number] Yes TAKE 1 VIAL (0.25 MG TOTAL) BY NEBULIZATION 2 (TWO) TIMES DAILY. Barb Merino, MD Taking Active   cephALEXin [facility]) 500 MG capsule [id number] Yes Take 1 capsule (500 mg total) by mouth 3 (three) times daily. Bronson Ing, DPM Taking Active   cloNIDine (CATAPRES) 0.1 MG tablet [id number] Yes Take 1 tablet (0.1 mg total) by mouth 2 (two) times daily. Must keep office visit tomorrow for additional refills.  Patient taking differently: Take 0.2 mg by mouth 2 (two) times daily. Must keep office visit tomorrow for  additional refills.   Kathleen Rakes, MD Taking Active   diclofenac Sodium (VOLTAREN) 1 % GEL [id number] Yes Apply 2 g topically 4 (four) times daily.  Patient taking differently: Apply 2 g topically 4 (four) times daily. Using as needed   Kathleen Rakes, MD Taking Active Self  EPINEPHrine 0.3 mg/0.3 mL IJ  SOAJ injection [id number] Yes Inject 0.3 mg into the muscle as needed for anaphylaxis. Parrett, Fonnie Mu, NP Taking Active   fluconazole (DIFLUCAN) 100 MG tablet [id number] Yes TAKE 1 TABLET (100 MG TOTAL) BY MOUTH ONCE FOR 1 DOSE. Barb Merino, MD Taking Active   fluticasone Asencion Islam) 50 MCG/ACT nasal spray [id number] Yes Place 1 spray into both nostrils daily as needed for allergies. Chesley Mires, MD Taking Active   fluticasone furoate-vilanterol (BREO ELLIPTA) 200-25 MCG/INH AEPB [id number] No Inhale 1 puff into the lungs daily.  Patient not taking: Reported on 02/24/2021   Parrett, Fonnie Mu, NP Not Taking Active   gabapentin (NEURONTIN) 300 MG capsule [id number] Yes Take 2 capsules (600 mg total) by mouth 3 (three) times daily. Kathleen Rakes, MD Taking Active   gabapentin (NEURONTIN) 600 MG tablet [id number] No Take 600 mg by mouth 3 (three) times daily.  Patient not taking: Reported on 02/24/2021   [provider] Not Taking Active Self  glucose blood (ACCU-CHEK GUIDE) test strip [id number] No Use as instructed daily before breakfast. Dx; Prediabetets; Prednisone use  Patient not taking: Reported on 02/24/2021   Kathleen Rakes, MD Not Taking Active Self  ipratropium (ATROVENT) 0.02 % nebulizer solution [id number] Yes Take 2.5 mLs (0.5 mg total) by nebulization 2 (two) times daily as needed for wheezing or shortness of breath. Chesley Mires, MD Taking Active Self  loratadine (CLARITIN) 10 MG tablet [id number] Yes Take 1 tablet (10 mg total) by mouth daily. Chesley Mires, MD Taking Active Self  loratadine (CLARITIN) 10 MG tablet [id number] No Take 10 mg by mouth daily.  Patient not taking: Reported on 02/24/2021   [provider] Not Taking Active Self  meloxicam (MOBIC) 7.5 MG tablet [id number] Yes Take 1 tablet (7.5 mg total) by mouth daily. Kathleen Rakes, MD Taking Active Self  Menthol (HALLS COUGH DROPS MT) [id number] No Use as directed 1 drop in the mouth or throat 2 (two) times daily as  needed (sore throat and cough).  Patient not taking: Reported on 02/24/2021   [provider] Not Taking Active Self  metFORMIN (GLUCOPHAGE) 500 MG tablet [id number] Yes TAKE 1 TABLET BY MOUTH 2 TIMES DAILY WITH A MEAL. Kathleen Rakes, MD Taking Active   metFORMIN (GLUCOPHAGE) 500 MG tablet [id number] No Take 500 mg by mouth 2 (two) times daily.  Patient not taking: Reported on 02/24/2021   [provider] Not Taking Active Self  methocarbamol (ROBAXIN) 500 MG tablet [id number] Yes Take 1 tablet (500 mg total) by mouth every 8 (eight) hours as needed for muscle spasms. Kathleen Rakes, MD Taking Active   methocarbamol (ROBAXIN) 500 MG tablet [id number] Yes Take 500 mg by mouth 3 (three) times daily. [provider] Taking Active Self  Misc. Devices [place] [id number] Yes Blood pressure monitor  Dx: Hypertension Kathleen Rakes, MD Taking Active Self  montelukast (SINGULAIR) 10 MG tablet [id number] Yes Take 1 tablet (10 mg total) by mouth at bedtime. Martyn Ehrich, NP Taking Active Self  montelukast (SINGULAIR) 10 MG tablet [id number] Yes TAKE 1 TABLET (10 MG TOTAL) BY MOUTH AT BEDTIME. Barb Merino, MD Taking Active   omeprazole (PRILOSEC) 20 MG capsule [id number]  Yes Take 20 mg by mouth daily. [provider] Taking Active Self  pantoprazole (PROTONIX) 40 MG tablet [id number] Yes Take 1 tablet (40 mg total) by mouth daily at 12 noon. Donne Hazel, MD Taking Active   predniSONE (DELTASONE) 10 MG tablet [id number] No 4 x 2 days, 3 x 2 days, 2 x 2 days, 1 x 2 days then stop  Patient not taking: Reported on 02/24/2021   Chesley Mires, MD Not Taking Active   predniSONE (DELTASONE) 10 MG tablet [id number] No Take 4 tabs x 2 days, 3 tabs x 2 days, 2 tabs x 2 days, 1 tab x 2 days then stop  Patient not taking: Reported on 02/24/2021   Baird Lyons D, MD Not Taking Active   predniSONE (DELTASONE) 20 MG tablet [id number] No Take 2 tablets daily with breakfast.  Patient not  taking: No sig reported   Jaynee Eagles, PA-C Not Taking Active   PROAIR HFA 108 [phone] Base) MCG/ACT inhaler [id number] Yes TAKE 2 PUFFS BY MOUTH EVERY 6 HOURS AS NEEDED FOR WHEEZE OR SHORTNESS OF Ivette Loyal, MD Taking Active   urea (CARMOL) 20 % cream [id number] Yes Apply 1 application topically daily as needed (For dry feet). Kathleen Rakes, MD Taking Active Self          Patient Active Problem List   Diagnosis Date Noted  . Asthma exacerbation 01/20/2021  . DM2 (diabetes mellitus, type 2) ([place]) 01/20/2021  . OSA (obstructive sleep apnea) 01/20/2021  . Essential hypertension 01/20/2021  . Obesity (BMI 30-39.9) 01/20/2021  . Cocaine abuse ([place]) 01/20/2021  . Toe fracture, left 06/22/2020  . Prediabetes 10/30/2019  . Hyperglycemia 10/10/2019  . Acute asthma exacerbation 10/09/2019  . COPD mixed type ([place]) 07/04/2019  . Oral thrush 07/04/2019  . Medication management 07/04/2019  . Chest pain 12/25/2018  . Acute-on-chronic respiratory failure ([place]) 12/24/2018  . Skin ulcer ([place]) secondary to bullous impetigo Marilynn Rail 08/29/2018  . Asthma 10/22/2017  . Osteoarthritis of left knee 09/01/2016  . 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 Tobacco abuse   . Primary osteoarthritis of left knee 03/09/2016  . Non compliance with medical treatment 08/19/2015  . Anemia, iron deficiency 08/12/2015  . Essential hypertension 07/25/2015  . COPD exacerbation ([place]) 12/28/2014  . Dysfunctional uterine bleeding 12/28/2014  . Anemia 12/28/2014  . Morbid obesity ([place]) 05/16/2014  . Chronic cough 05/01/2014  . Upper airway cough syndrome 05/01/2014  . GERD (gastroesophageal reflux disease) 04/08/2014  . Asthma exacerbation 04/06/2014  . OSA (obstructive sleep apnea) 03/20/2013  . Uncontrolled persistent asthma 02/05/2013    Conditions to be addressed/monitored per PCP order:  chronic healthcare management needs, OSA, DM2, HTN, asthma,  COPD, osteoarthritis.  Care Plan : General Plan of Care (Adult)  Updates made by Kathleen Medicus, RN since 02/24/2021 12:00 AM    Problem: Health Promotion or Disease Self-Management (General Plan of Care)   Priority: High  Onset Date: 02/24/2021    Problem: Health Promotion or Disease Self-Management (General Plan of Care)     Long-Range Goal: Self-Management Plan Developed   Start Date: 02/24/2021  Expected End Date: 05/27/2021  This Visit's Progress: Not on track  Priority: High  Note:   Current Barriers:   Ineffective Self Health Maintenance-patient not checking blood sugars-needs to pick glucometer up at pharmacy.  Also, checking blood pressure irregularly.  Transportation issues, patient utilized [facility] transportation and was told no available drivers.  Currently UNABLE TO  independently self manage needs related to chronic health conditions.   Knowledge Deficits related to short term plan for care coordination needs and long term plans for chronic disease management needs Nurse Case Manager Clinical Goal(s):   patient will work with care management team to address care coordination and chronic disease management needs related to Disease Management  Educational Needs  Care Coordination  Medication Management and Education  Medication Reconciliation  Medication Assistance   Psychosocial Support   Interventions:   Evaluation of current treatment plan and patient's adherence to plan as established by provider.  Reviewed medications with patient.  Collaborated with pharmacy regarding medications.  Discussed plans with patient for ongoing care management follow up and provided patient with direct contact information for care management team  Advised patient, providing education and rationale, to monitor blood pressure daily and record, calling provider for findings outside established parameters.   Reviewed scheduled/upcoming provider appointments.  Advised patient,  providing education and rationale, to check cbg and record, calling provider for findings outside established parameters.    Care Guide referral for transportation.  Pharmacy referral for medication review. Self Care Activities:  . Patient will self administer medications as prescribed . Patient will attend all scheduled provider appointments . Patient will call pharmacy for medication refills . Patient will continue to perform ADL's independently . Patient will call provider office for new concerns or questions Patient Goals: In the next 30 days, patient will check blood pressure and blood sugars as directed by provider. In the next 30 days, patient will attend all scheduled appointments. In the next 30 days, patient will meet with Pharmacist to discuss medications.- Follow Up Plan: The patient has been provided with contact information for the care management team and has been advised to call with any health related questions or concerns.  The care management team will reach out to the patient again over the next 30 days.     Evidence-based guidance:    Review biopsychosocial determinants of health screens.   Review need for preventive screening based on age, sex, family history and health history.   Determine level of modifiable health risk. .   Discuss identified risks.   Identify areas where behavior change may lead to improved health.   Promote healthy lifestyle.   Evoke change talk using open-ended questions, pros and cons, as well as looking forward.   Identify and manage conditions or preconditions to reduce health risk.   Implement additional goals and interventions based on identified risk factors.            Follow Up:  Patient agrees to Care Plan and Follow-up.  Plan: The Managed Medicaid care management team will reach out to the patient again over the next 30 days. and The patient has been provided with contact information for the Managed Medicaid care  management team and has been advised to call with any health related questions or concerns.  Date/time of next scheduled RN care management/care coordination outreach:  03/23/21 at 0900.

## 2021-02-24 NOTE — Telephone Encounter (Signed)
Her chart does reflect referrals to specialists after her lesion did not improve with Dermatology visits.  Dating back to 2019 there are referrals from myself and other clinicians to [facility] medical and other specialists and during her visit with me she verbalized follow-up with a specialist.  Unfortunately this lesion was managed by other specialist, she would need to obtain the diagnosis from [facility] or some other place she went. (see my note from 08/2018) The letter for plasma is available if she needs it .

## 2021-02-24 NOTE — Patient Instructions (Signed)
Hi Ms. Hehn, thank you for speaking with me today.  Ms. Cortez was given information about Medicaid Managed Care team care coordination services as a part of their [facility] Community Plan Medicaid benefit. [name] verbally consented to engagement with the [facility] Managed Care team.   For questions related to your [facility], please call: [phone] or visit the homepage here: [email or web address]  If you would like to schedule transportation through your [facility], please call the following number at least 2 days in advance of your appointment: [phone].   Call the Behavioral Health Crisis Line at [phone], at any time, 24 hours a day, 7 days a week. If you are in danger or need immediate medical attention call 911.  Ms. Mortell - following are the goals we discussed in your visit today:  Goals Addressed            This Visit's Progress   . Protect My Health       Timeframe:  Long-Range Goal Priority:  High Start Date:        02/24/21                     Expected End Date:        05/27/21               Follow Up Date 03/27/21   - schedule appointment for flu shot - schedule appointment for vaccines needed due to my age or health - schedule recommended health tests (blood work, mammogram, colonoscopy, pap test) - schedule and keep appointment for annual check-up    Why is this important?    Screening tests can find diseases early when they are easier to treat.   Your doctor or nurse will talk with you about which tests are important for you.   Getting shots for common diseases like the flu and shingles will help prevent them.      Patient verbalizes understanding of instructions provided today.   The Managed Medicaid care management team will reach out to the patient again over the next 30 days.  The patient has been provided with contact information for  the Managed Medicaid care management team and has been advised to call with any health related questions or concerns.   [name] RN, BSN [place]  [facility] Care Management Coordinator - Managed Medicaid High Risk [phone]  Following is a copy of your plan of care:  Patient Care Plan: General Plan of Care (Adult)    Problem Identified: Health Promotion or Disease Self-Management (General Plan of Care)   Priority: High  Onset Date: 02/24/2021    Problem Identified: Health Promotion or Disease Self-Management (General Plan of Care)     Long-Range Goal: Self-Management Plan Developed   Start Date: 02/24/2021  Expected End Date: 05/27/2021  This Visit's Progress: Not on track  Priority: High  Note:   Current Barriers:   Ineffective Self Health Maintenance-patient not checking blood sugars-needs to pick glucometer up at pharmacy.  Also, checking blood pressure irregularly.  Transportation issues, patient utilized [facility] transportation and was told no available drivers.  Currently UNABLE TO independently self manage needs related to chronic health conditions.   Knowledge Deficits related to short term plan for care coordination needs and long term plans for chronic disease management needs Nurse Case Manager Clinical Goal(s):   patient will work with care management team to address care coordination and chronic disease  management needs related to Disease Management  Educational Needs  Care Coordination  Medication Management and Education  Medication Reconciliation  Medication Assistance   Psychosocial Support   Interventions:   Evaluation of current treatment plan and patient's adherence to plan as established by provider.  Reviewed medications with patient.  Collaborated with pharmacy regarding medications.  Discussed plans with patient for ongoing care management follow up and provided patient with direct contact information for care management  team  Advised patient, providing education and rationale, to monitor blood pressure daily and record, calling provider for findings outside established parameters.   Reviewed scheduled/upcoming provider appointments.  Advised patient, providing education and rationale, to check cbg and record, calling provider for findings outside established parameters.    Care Guide referral for transportation.  Pharmacy referral for medication review. Self Care Activities:  . Patient will self administer medications as prescribed . Patient will attend all scheduled provider appointments . Patient will call pharmacy for medication refills . Patient will continue to perform ADL's independently . Patient will call provider office for new concerns or questions Patient Goals: In the next 30 days, patient will check blood pressure and blood sugars as directed by provider. In the next 30 days, patient will attend all scheduled appointments. In the next 30 days, patient will meet with Pharmacist to discuss medications.- Follow Up Plan: The patient has been provided with contact information for the care management team and has been advised to call with any health related questions or concerns.  The care management team will reach out to the patient again over the next 30 days.     Evidence-based guidance:    Review biopsychosocial determinants of health screens.   Review need for preventive screening based on age, sex, family history and health history.   Determine level of modifiable health risk. .   Discuss identified risks.   Identify areas where behavior change may lead to improved health.   Promote healthy lifestyle.   Evoke change talk using open-ended questions, pros and cons, as well as looking forward.   Identify and manage conditions or preconditions to reduce health risk.   Implement additional goals and interventions based on identified risk factors.

## 2021-02-24 NOTE — Telephone Encounter (Signed)
Pt was called and informed of letter being ready for pick up. 

## 2021-02-25 ENCOUNTER — Telehealth: Payer: Self-pay | Admitting: *Deleted

## 2021-02-25 NOTE — Telephone Encounter (Signed)
   Telephone encounter was:  Successful.  02/25/2021 Name: Kathleen Rodriguez MRN: [id number] DOB: [birth year]  [name] is a 55 y.o. year old female who is a primary care patient of Hoy Register, MD . The community resource team was consulted for assistance with Transportation Needs   Care guide performed the following interventions: Patient provided with information about care guide support team and interviewed to confirm resource needs.  Follow Up Plan:  No further follow up planned at this time. The patient has been provided with needed resources. called back with appt for tomorrow so set up [facility] transportaion    Alois Cliche -[facility] Guide , Embedded Care Coordination [facility], Care Management  [phone] [address] [place] , [place] [address] Email : [email or web address]

## 2021-02-25 NOTE — Telephone Encounter (Signed)
     Telephone encounter was:  Successful.  02/25/2021 Name: LARUA COLLIER MRN: [id number] DOB: [birth year]  [name] is a 55 y.o. year old female who is a primary care patient of Hoy Register, MD . The community resource team was consulted for assistance with Transportation Needs   Care guide performed the following interventions: Patient provided with information about care guide support team and interviewed to confirm resource needs.  Follow Up Plan:  Client will call back.  Alois Cliche -[facility] Guide , Embedded Care Coordination [facility], Care Management  [phone] [address] [place] , [place] [address] Email : [email or web address]

## 2021-02-26 ENCOUNTER — Ambulatory Visit: Payer: Medicaid Other

## 2021-03-01 ENCOUNTER — Other Ambulatory Visit: Payer: Self-pay

## 2021-03-01 NOTE — Patient Instructions (Addendum)
Visit Information  Kathleen Rodriguez was given information about Medicaid Managed Care team care coordination services as a part of their [facility] Medicaid benefit. Kathleen Rodriguez verbally consented to engagement with the [facility] Managed Care team.   For questions related to your [facility], please call: [phone] or visit the homepage here: [email or web address]  If you would like to schedule transportation through your [facility], please call the following number at least 2 days in advance of your appointment: [phone].   Call the Behavioral Health Crisis Line at [phone], at any time, 24 hours a day, 7 days a week. If you are in danger or need immediate medical attention call 911.  Kathleen Rodriguez - following are the goals we discussed in your visit today:  Goals Addressed            This Visit's Progress   . Manage My Medicine       Timeframe:  Short-Term Goal Priority:  High Start Date:                             Expected End Date:                       Follow Up Date 04/01/21   - call for medicine refill 2 or 3 days before it runs out - call if I am sick and can't take my medicine - keep a list of all the medicines I take; vitamins and herbals too - learn to read medicine labels    Why is this important?   . These steps will help you keep on track with your medicines.   Notes:        Please see education materials related to DM provided as print materials.   Patient verbalizes understanding of instructions provided today.   The Managed Medicaid care management team will reach out to the patient again over the next 30 days.   Kathleen Rodriguez, Pharm.D., Managed Medicaid Pharmacist [phone]   Following is a copy of your plan of care:  Patient Care Plan: General Plan of Care (Adult)    Problem Identified: Health Promotion or Disease  Self-Management (General Plan of Care)   Priority: High  Onset Date: 02/24/2021    Problem Identified: Health Promotion or Disease Self-Management (General Plan of Care)     Long-Range Goal: Self-Management Plan Developed   Start Date: 02/24/2021  Expected End Date: 05/27/2021  This Visit's Progress: Not on track  Priority: High  Note:   Current Barriers:   Ineffective Self Health Maintenance-patient not checking blood sugars-needs to pick glucometer up at pharmacy.  Also, checking blood pressure irregularly.  Transportation issues, patient utilized [facility] transportation and was told no available drivers.  Currently UNABLE TO independently self manage needs related to chronic health conditions.   Knowledge Deficits related to short term plan for care coordination needs and long term plans for chronic disease management needs Nurse Case Manager Clinical Goal(s):   patient will work with care management team to address care coordination and chronic disease management needs related to Disease Management  Educational Needs  Care Coordination  Medication Management and Education  Medication Reconciliation  Medication Assistance   Psychosocial Support   Interventions:   Evaluation of current treatment plan and patient's adherence to plan as established by provider.  Reviewed medications with patient.  Collaborated with pharmacy regarding  medications.  Discussed plans with patient for ongoing care management follow up and provided patient with direct contact information for care management team  Advised patient, providing education and rationale, to monitor blood pressure daily and record, calling provider for findings outside established parameters.   Reviewed scheduled/upcoming provider appointments.  Advised patient, providing education and rationale, to check cbg and record, calling provider for findings outside established parameters.    Care Guide referral for  transportation.  Pharmacy referral for medication review. Self Care Activities:  . Patient will self administer medications as prescribed . Patient will attend all scheduled provider appointments . Patient will call pharmacy for medication refills . Patient will continue to perform ADL's independently . Patient will call provider office for new concerns or questions Patient Goals: In the next 30 days, patient will check blood pressure and blood sugars as directed by provider. In the next 30 days, patient will attend all scheduled appointments. In the next 30 days, patient will meet with Pharmacist to discuss medications.- Follow Up Plan: The patient has been provided with contact information for the care management team and has been advised to call with any health related questions or concerns.  The care management team will reach out to the patient again over the next 30 days.     Evidence-based guidance:    Review biopsychosocial determinants of health screens.   Review need for preventive screening based on Rodriguez, sex, family history and health history.   Determine level of modifiable health risk. .   Discuss identified risks.   Identify areas where behavior change may lead to improved health.   Promote healthy lifestyle.   Evoke change talk using open-ended questions, pros and cons, as well as looking forward.   Identify and manage conditions or preconditions to reduce health risk.   Implement additional goals and interventions based on identified risk factors.    Task: Mutually Develop and Kathleen Rodriguez Achievement of Patient Goals   Note:   Care Management Activities:    - verbalization of feelings encouraged    Notes:    Patient Care Plan: Medication Management    Problem Identified: Health Promotion or Disease Self-Management (General Plan of Care)     Goal: Medication Management   Note:   Current Barriers:  . Unable to independently monitor therapeutic  efficacy . Does not adhere to prescribed medication regimen . Does not maintain contact with provider office . Does not contact provider office for questions/concerns .   Pharmacist Clinical Goal(s):  Kathleen Rodriguez. Over the next 30 days, patient will achieve adherence to monitoring guidelines and medication adherence to achieve therapeutic efficacy . contact provider office for questions/concerns as evidenced notation of same in electronic health record through collaboration with PharmD and provider.  .   Interventions: . Inter-disciplinary care team collaboration (see longitudinal plan of care) . Comprehensive medication review performed; medication list updated in electronic medical record  @RXCPDIABETES @ @RXCPHYPERTENSION @ @RXCPHYPERLIPIDEMIA @ @RXCPASTHMA @  Patient Goals/Self-Care Activities . Over the next 30 days, patient will:  - take medications as prescribed check blood pressure Weekly, document, and provide at future appointments  Follow Up Plan: The care management team will reach out to the patient again over the next 30 days.     Task: Mutually Develop and Kathleen Rodriguez Achievement of Patient Goals   Note:   Care Management Activities:    - verbalization of feelings encouraged    Notes:      Blood Glucose Monitoring, Adult Monitoring your blood sugar (glucose) is an  important part of managing your diabetes. Blood glucose monitoring involves checking your blood glucose as often as directed and keeping a log or record of your results over time. Checking your blood glucose regularly and keeping a blood glucose log can:  Help you and your health care provider adjust your diabetes management plan as needed, including your medicines or insulin.  Help you understand how food, exercise, illnesses, and medicines affect your blood glucose.  Let you know what your blood glucose is at any time. You can quickly find out if you have low blood glucose (hypoglycemia) or high blood glucose  (hyperglycemia). Your health care provider will set individualized treatment goals for you. Your goals will be based on your Rodriguez, other medical conditions you have, and how you respond to diabetes treatment. Generally, the goal of treatment is to maintain the following blood glucose levels:  Before meals (preprandial): 80-130 mg/dL (1.8-2.9 mmol/L).  After meals (postprandial): below 180 mg/dL (10 mmol/L).  A1C level: less than 7%. Supplies needed:  Blood glucose meter.  Test strips for your meter. Each meter has its own strips. You must use the strips that came with your meter.  A needle to prick your finger (lancet). Do not use a lancet more than one time.  A device that holds the lancet (lancing device).  A journal or log book to write down your results. How to check your blood glucose Checking your blood glucose 1. Wash your hands for at least 20 seconds with soap and water. 2. Prick the side of your finger (not the tip) with the lancet. Do not use the same finger consecutively. 3. Gently rub the finger until a small drop of blood appears. 4. Follow instructions that come with your meter for inserting the test strip, applying blood to the strip, and using your blood glucose meter. 5. Write down your result and any notes in your log.   Using alternative sites Some meters allow you to use areas of your body other than your finger (alternative sites) to test your blood. The most common alternative sites are the forearm, the thigh, and the palm of your hand. Alternative sites may not be as accurate as the fingers because blood flow is slower in those areas. This means that the result you get may be delayed, and it may be different from the result that you would get from your finger. Use the finger only, and do not use alternative sites, if:  You think you have hypoglycemia.  You sometimes do not know that your blood glucose is getting low (hypoglycemia unawareness). General tips and  recommendations Blood glucose log  Every time you check your blood glucose, write down your result. Also write down any notes about things that may be affecting your blood glucose, such as your diet and exercise for the day. This information can help you and your health care provider: ? Look for patterns in your blood glucose over time. ? Adjust your diabetes management plan as needed.  Check if your meter allows you to download your records to a computer or if there is an app for the meter. Most glucose meters store a record of glucose readings in the meter.   If you have type 1 diabetes:  Check your blood glucose 4 or more times a day if you are on intensive insulin therapy with multiple daily injections (MDI) or if you are using an insulin pump. Check your blood glucose: ? Before every meal and snack. ? Before  bedtime.  Also check your blood glucose: ? If you have symptoms of hypoglycemia. ? After treating low blood glucose. ? Before doing activities that create a risk for injury, like driving or using machinery. ? Before and after exercise. ? Two hours after a meal. ? Occasionally between 2:00 a.m. and 3:00 a.m., as directed.  You may need to check your blood glucose more often, 6-10 times per day, if: ? You have diabetes that is not well controlled. ? You are ill. ? You have a history of severe hypoglycemia. ? You have hypoglycemia unawareness. If you have type 2 diabetes:  Check your blood glucose 2 or more times a day if you take insulin or other diabetes medicines.  Check your blood glucose 4 or more times a day if you are on intensive insulin therapy. Occasionally, you may also need to check your glucose between 2:00 a.m. and 3:00 a.m., as directed.  Also check your blood glucose: ? Before and after exercise. ? Before doing activities that create a risk for injury, like driving or using machinery.  You may need to check your blood glucose more often if: ? Your medicine  is being adjusted. ? Your diabetes is not well controlled. ? You are ill. General tips  Make sure you always have your supplies with you.  After you use a few boxes of test strips, adjust (calibrate) your blood glucose meter by following instructions that came with your meter.  If you have questions or need help, all blood glucose meters have a 24-hour hotline phone number available that you can call. Also contact your health care provider with questions or concerns you may have. Where to find more information  The American Diabetes Association: www.diabetes.org  The Association of Diabetes Care & Education Specialists: www.diabeteseducator.org Contact a health care provider if:  Your blood glucose is at or above 240 mg/dL (60.6 mmol/L) for 2 days in a row.  You have been sick or have had a fever for 2 days or longer, and you are not getting better.  You have any of the following problems for more than 6 hours: ? You cannot eat or drink. ? You have nausea or vomiting. ? You have diarrhea. Get help right away if:  Your blood glucose is lower than 54 mg/dL (3 mmol/L).  You become confused, or you have trouble thinking clearly.  You have difficulty breathing.  You have moderate or large ketone levels in your urine. These symptoms may represent a serious problem that is an emergency. Do not wait to see if the symptoms will go away. Get medical help right away. Call your local emergency services (911 in the [place] Do not drive yourself to the hospital. Summary  Monitoring your blood glucose is an important part of managing your diabetes.  Blood glucose monitoring involves checking your blood glucose as often as directed and keeping a log or record of your results over time.  Your health care provider will set individualized treatment goals for you. Your goals will be based on your Rodriguez, other medical conditions you have, and how you respond to diabetes treatment.  Every time you  check your blood glucose, write down your result. Also, write down any notes about things that may be affecting your blood glucose, such as your diet and exercise for the day. This information is not intended to replace advice given to you by your health care provider. Make sure you discuss any questions you have with your health care  provider. Document Revised: 07/08/2020 Document Reviewed: 09/15/2021Elsevier Patient Education  2021 [employer].

## 2021-03-01 NOTE — Patient Outreach (Signed)
Medicaid Managed Care    Pharmacy Note  03/01/2021 Name: Kathleen Rodriguez MRN: [id number] DOB: [birth year]  Kathleen Rodriguez is a 55 y.o. year old female who is a primary care patient of [name], MD. The [facility] Managed Care Coordination team was consulted for assistance with disease management and care coordination needs.    Engaged with patient Engaged with patient by telephone for initial visit in response to referral for case management and/or care coordination services.  Ms. Brassfield was given information about Managed Medicaid Care Coordination team services today. Kathleen Rodriguez agreed to services and verbal consent obtained.   Objective:  Lab Results  Component Value Date   CREATININE 0.74 01/21/2021   CREATININE 0.93 01/20/2021   CREATININE 0.89 12/02/2020    Lab Results  Component Value Date   HGBA1C 5.8 (H) 01/21/2021       Component Value Date/Time   CHOL 172 12/02/2020 1143   TRIG 111 12/02/2020 1143   HDL 53 12/02/2020 1143   CHOLHDL 3.2 12/02/2020 1143   CHOLHDL 2.2 12/25/2018 0650   VLDL 3 12/25/2018 0650   LDLCALC 99 12/02/2020 1143    Other: (TSH, CBC, Vit D, etc.)  Clinical ASCVD: No  The 10-year ASCVD risk score [name] DC Jr., et al., 2013) is: 8.1%   Values used to calculate the score:     Age: 69 years     Sex: Female     Is Non-Hispanic African American: Yes     Diabetic: Yes     Tobacco smoker: No     Systolic Blood Pressure: 216 mmHg     Is BP treated: Yes     HDL Cholesterol: 53 mg/dL     Total Cholesterol: 172 mg/dL    Other: (CHADS2VASc if Afib, PHQ9 if depression, MMRC or CAT for COPD, ACT, DEXA)  BP Readings from Last 3 Encounters:  01/21/21 (!) 119/107  12/02/20 128/75  10/29/20 106/80    Assessment/Interventions: Review of patient past medical history, allergies, medications, health status, including review of consultants reports, laboratory and other test data, was performed as part of comprehensive evaluation and provision  of chronic care management services.   DM Metformin 510m  Plan: At goal,  patient stable/ symptoms controlled   BP/Hot Flashes Clonidine 0.153mBID -not checking BP at home Plan: Check BP 1/month  Nerve Pain Before Meds: Patient really couldn't answer the question After Meds: 7 Gabapentin Plan: I asked patient about openness to changing therapy and she wasn't. At goal,  patient stable/ symptoms controlled  Asthma Albuterol Neb: Uses both nebs together Budesonide Neb: Uses both nebs together Ipratroprium Neb: PRN -Uses above 2x/week ProAir States Breo covered sometimes and sometimes not -Patient taking ProAir and Albuterol together. Plan: Called Pharmacy to see if BrOvettA went through. Spoke with MeAilene Ravelnot approved. Asked to re-fax  SDOH (Social Determinants of Health) assessments and interventions performed:    Care Plan  Allergies  Allergen Reactions  . Tomato Hives, Itching and Other (See Comments)    ALSO REACTS TO KETCHUP  . Latex Itching and Rash  . Latex Hives and Rash  . Tomato Hives and Rash  . Wool Alcohol [Lanolin] Itching    Medications Reviewed Today    Reviewed by KeLane HackerRPParkside [facility] LLCPharmacist) on 03/01/21 at 1133Med List Status: <None>  Medication Order Taking? Sig Documenting Provider Last Dose Status Informant  acetaminophen (TYLENOL) 500 MG tablet [id number] 500 mg by mouth every 6 (six)  hours as needed for mild pain. [provider]  Active Self  albuterol (PROVENTIL) (2.5 MG/3ML) 0.083% nebulizer solution [id number] Yes Take 3 mLs (2.5 mg total) by nebulization every 6 (six) hours as needed for wheezing or shortness of breath. Chesley Mires, MD Taking Active   albuterol (PROVENTIL) (2.5 MG/3ML) 0.083% nebulizer solution [id number] No Take 2.5 mg by nebulization every 6 (six) hours as needed for shortness of breath or wheezing.  Patient not taking: No sig reported   [provider] Not Taking Consider Medication Status  and Discontinue   albuterol (VENTOLIN HFA) 108 (90 Base) MCG/ACT inhaler [id number] Yes Inhale 1 puff into the lungs every 4 (four) hours as needed for wheezing or shortness of breath. [provider] Taking Active Self  aspirin 81 MG EC tablet [id number] Yes Take 1 tablet (81 mg total) by mouth daily. [name], MD Taking Active Self  aspirin EC 81 MG tablet [id number] No Take 81 mg by mouth daily. Swallow whole.  Patient not taking: No sig reported   [provider] Not Taking Consider Medication Status and Discontinue   Blood Glucose Monitoring Suppl (ACCU-CHEK GUIDE ME) w/Device KIT [id number] Yes 1 each by Does not apply route daily. Dx; Prediabetets; Prednisone use [name], MD Taking Active   budesonide (PULMICORT) 0.25 MG/2ML nebulizer solution [id number] Yes TAKE 1 VIAL (0.25 MG TOTAL) BY NEBULIZATION 2 (TWO) TIMES DAILY. Barb Merino, MD Taking Active   cephALEXin (KEFLEX) 500 MG capsule [id number] No Take 1 capsule (500 mg total) by mouth 3 (three) times daily.  Patient not taking: Reported on 03/01/2021   Bronson Ing, DPM Not Taking Consider Medication Status and Discontinue   cloNIDine (CATAPRES) 0.1 MG tablet [id number] Yes Take 1 tablet (0.1 mg total) by mouth 2 (two) times daily. Must keep office visit tomorrow for additional refills.  Patient taking differently: Take 0.2 mg by mouth 2 (two) times daily. Must keep office visit tomorrow for additional refills.   [name], MD Taking Active   diclofenac Sodium (VOLTAREN) 1 % GEL [id number] Yes Apply 2 g topically 4 (four) times daily.  Patient taking differently: Apply 2 g topically 4 (four) times daily. Using as needed   [name], MD Taking Active Self  EPINEPHrine 0.3 mg/0.3 mL IJ SOAJ injection [id number] Yes Inject 0.3 mg into the muscle as needed for anaphylaxis. Parrett, Fonnie Mu, NP Taking Active   fluconazole (DIFLUCAN) 100 MG tablet [id number] No TAKE 1 TABLET (100 MG TOTAL) BY MOUTH  ONCE FOR 1 DOSE.  Patient not taking: Reported on 03/01/2021   Barb Merino, MD Not Taking Consider Medication Status and Discontinue   fluticasone (FLONASE) 50 MCG/ACT nasal spray [id number] Yes Place 1 spray into both nostrils daily as needed for allergies. Chesley Mires, MD Taking Active   fluticasone furoate-vilanterol (BREO ELLIPTA) 200-25 MCG/INH AEPB [id number] Yes Inhale 1 puff into the lungs daily. Parrett, Fonnie Mu, NP Taking Active   gabapentin (NEURONTIN) 300 MG capsule [id number] Yes Take 2 capsules (600 mg total) by mouth 3 (three) times daily. [name], MD Taking Active   gabapentin (NEURONTIN) 600 MG tablet [id number] No Take 600 mg by mouth 3 (three) times daily.  Patient not taking: No sig reported   [provider] Not Taking Consider Medication Status and Discontinue   glucose blood (ACCU-CHEK GUIDE) test strip [id number] Yes Use as instructed daily before breakfast. Dx; Prediabetets; Prednisone use [name], MD Taking Active   ipratropium (ATROVENT) 0.02 % nebulizer solution [id number] Yes  Take 2.5 mLs (0.5 mg total) by nebulization 2 (two) times daily as needed for wheezing or shortness of breath. Chesley Mires, MD Taking Active Self  loratadine (CLARITIN) 10 MG tablet [id number] Yes Take 1 tablet (10 mg total) by mouth daily. Chesley Mires, MD Taking Active Self  loratadine (CLARITIN) 10 MG tablet [id number] No Take 10 mg by mouth daily.  Patient not taking: No sig reported   [provider] Not Taking Consider Medication Status and Discontinue   meloxicam (MOBIC) 7.5 MG tablet [id number] Yes Take 1 tablet (7.5 mg total) by mouth daily. [name], MD Taking Active Self  Menthol (HALLS COUGH DROPS MT) [id number] No Use as directed 1 drop in the mouth or throat 2 (two) times daily as needed (sore throat and cough).  Patient not taking: No sig reported   [provider] Not Taking Consider Medication Status and Discontinue   metFORMIN  (GLUCOPHAGE) 500 MG tablet [id number] Yes TAKE 1 TABLET BY MOUTH 2 TIMES DAILY WITH A MEAL. [name], MD Taking Active   metFORMIN (GLUCOPHAGE) 500 MG tablet [id number] No Take 500 mg by mouth 2 (two) times daily.  Patient not taking: No sig reported   [provider] Not Taking Consider Medication Status and Discontinue   methocarbamol (ROBAXIN) 500 MG tablet [id number] No Take 1 tablet (500 mg total) by mouth every 8 (eight) hours as needed for muscle spasms.  Patient not taking: Reported on 03/01/2021   [name], MD Not Taking Consider Medication Status and Discontinue   methocarbamol (ROBAXIN) 500 MG tablet [id number] Yes Take 500 mg by mouth 3 (three) times daily. [provider] Taking Active Self  Misc. Devices MISC [id number]  Blood pressure monitor  Dx: Hypertension [name], MD  Active Self  montelukast (SINGULAIR) 10 MG tablet [id number]  Take 1 tablet (10 mg total) by mouth at bedtime. Martyn Ehrich, NP  Active Self  montelukast (SINGULAIR) 10 MG tablet [id number] No TAKE 1 TABLET (10 MG TOTAL) BY MOUTH AT BEDTIME.  Patient not taking: Reported on 03/01/2021   Barb Merino, MD Not Taking Consider Medication Status and Discontinue   omeprazole (PRILOSEC) 20 MG capsule [id number]  Take 20 mg by mouth daily. [provider]  Active Self  pantoprazole (PROTONIX) 40 MG tablet [id number] No Take 1 tablet (40 mg total) by mouth daily at 12 noon.  Patient not taking: Reported on 03/01/2021   Donne Hazel, MD Not Taking Consider Medication Status and Discontinue   predniSONE (DELTASONE) 10 MG tablet [id number] No 4 x 2 days, 3 x 2 days, 2 x 2 days, 1 x 2 days then stop  Patient not taking: No sig reported   Chesley Mires, MD Not Taking Consider Medication Status and Discontinue   predniSONE (DELTASONE) 10 MG tablet [id number] No Take 4 tabs x 2 days, 3 tabs x 2 days, 2 tabs x 2 days, 1 tab x 2 days then stop  Patient not taking: No sig reported    Baird Lyons D, MD Not Taking Consider Medication Status and Discontinue   predniSONE (DELTASONE) 20 MG tablet [id number] No Take 2 tablets daily with breakfast.  Patient not taking: No sig reported   Jaynee Eagles, PA-C Not Taking Consider Medication Status and Discontinue   PROAIR HFA 108 (90 Base) MCG/ACT inhaler [id number] Yes TAKE 2 PUFFS BY MOUTH EVERY 6 HOURS AS NEEDED FOR WHEEZE OR SHORTNESS OF Ivette Loyal, MD Taking Active   urea (CARMOL) 20 % cream [id number]  Apply 1 application topically daily as needed (For dry feet). [name], MD  Active Self          Patient Active Problem List   Diagnosis Date Noted  . Asthma exacerbation 01/20/2021  . DM2 (diabetes mellitus, type 2) ([place]) 01/20/2021  . OSA (obstructive sleep apnea) 01/20/2021  . Essential hypertension 01/20/2021  . Obesity (BMI 30-39.9) 01/20/2021  . Cocaine abuse ([place]) 01/20/2021  . Toe fracture, left 06/22/2020  . Prediabetes 10/30/2019  . Hyperglycemia 10/10/2019  . Acute asthma exacerbation 10/09/2019  . COPD mixed type ([place]) 07/04/2019  . Oral thrush 07/04/2019  . Medication management 07/04/2019  . Chest pain 12/25/2018  . Acute-on-chronic respiratory failure ([place]) 12/24/2018  . Skin ulcer ([place]) secondary to bullous impetigo Marilynn Rail 08/29/2018  . Asthma 10/22/2017  . Osteoarthritis of left knee 09/01/2016  . 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 Tobacco abuse   . Primary osteoarthritis of left knee 03/09/2016  . Non compliance with medical treatment 08/19/2015  . Anemia, iron deficiency 08/12/2015  . Essential hypertension 07/25/2015  . COPD exacerbation ([place]) 12/28/2014  . Dysfunctional uterine bleeding 12/28/2014  . Anemia 12/28/2014  . Morbid obesity ([place]) 05/16/2014  . Chronic cough 05/01/2014  . Upper airway cough syndrome 05/01/2014  . GERD (gastroesophageal reflux disease) 04/08/2014  . Asthma exacerbation 04/06/2014  . OSA  (obstructive sleep apnea) 03/20/2013  . Uncontrolled persistent asthma 02/05/2013    Conditions to be addressed/monitored: HTN, DM and Pulmonary Disease  Care Plan : Medication Management  Updates made by Lane Hacker, [place] since 03/01/2021 12:00 AM    Problem: Health Promotion or Disease Self-Management (General Plan of Care)     Goal: Medication Management   Note:   Current Barriers:  . Unable to independently monitor therapeutic efficacy . Does not adhere to prescribed medication regimen . Does not maintain contact with provider office . Does not contact provider office for questions/concerns .   Pharmacist Clinical Goal(s):  Marland Kitchen Over the next 30 days, patient will achieve adherence to monitoring guidelines and medication adherence to achieve therapeutic efficacy . contact provider office for questions/concerns as evidenced notation of same in electronic health record through collaboration with PharmD and provider.  .   Interventions: . Inter-disciplinary care team collaboration (see longitudinal plan of care) . Comprehensive medication review performed; medication list updated in electronic medical record  '@RXCPDIABETES' @ '@RXCPHYPERTENSION' @ '@RXCPHYPERLIPIDEMIA' @ '@RXCPASTHMA' @  Patient Goals/Self-Care Activities . Over the next 30 days, patient will:  - take medications as prescribed check blood pressure Weekly, document, and provide at future appointments  Follow Up Plan: The care management team will reach out to the patient again over the next 30 days.     Task: Mutually Develop and Royce Macadamia Achievement of Patient Goals   Note:   Care Management Activities:    - verbalization of feelings encouraged    Notes:      Medication Assistance: None required. Patient affirms current coverage meets needs.   Follow up: Agree   Plan: The care management team will reach out to the patient again over the next 30 days.   [place] Constable, Pharm.D., Managed Medicaid Pharmacist  - [phone]

## 2021-03-03 ENCOUNTER — Telehealth: Payer: Self-pay | Admitting: Adult Health

## 2021-03-03 DIAGNOSIS — J4551 Severe persistent asthma with (acute) exacerbation: Secondary | ICD-10-CM

## 2021-03-03 NOTE — Telephone Encounter (Signed)
Pt was called and she states that they did not accept the letter that was written. Pt is needing the [employer] form filled out for her problem with her legs.  Pt states that she does not remember the dermatologist name and she states that she saw Dr.wright in 2019 for issue with Leg.  Pt states that she will reach out to [employer] to get paperwork re faxed.

## 2021-03-03 NOTE — Telephone Encounter (Signed)
Copied from CRM [id number]. Topic: General - Other >> Mar 03, 2021 10:10 AM Jaquita Rector A wrote: Reason for CRM: Patient called in asking if her form for [employer] [place] is ready to be picked up. Please call her at Ph# [phone]

## 2021-03-03 NOTE — Telephone Encounter (Signed)
Called pt to confirm what she was needing  She states needing to schedule Fascenra inj  Forwarding to pharmacy team, thanks!

## 2021-03-05 ENCOUNTER — Other Ambulatory Visit (HOSPITAL_COMMUNITY): Payer: Self-pay

## 2021-03-05 ENCOUNTER — Telehealth: Payer: Self-pay | Admitting: Family Medicine

## 2021-03-05 ENCOUNTER — Ambulatory Visit: Payer: Medicaid Other | Admitting: Pulmonary Disease

## 2021-03-05 MED ORDER — FASENRA PEN 30 MG/ML ~~LOC~~ SOAJ
SUBCUTANEOUS | 0 refills | Status: DC
  Filled 2021-03-05: qty 3, fill #0
  Filled 2021-03-08: qty 1, 28d supply, fill #0

## 2021-03-05 NOTE — Telephone Encounter (Signed)
Patient has been recurrent no-show to [facility] new start appointments. She confirmed she has Epipen on hand and is in date.  Rx sent to [facility] for Fasenra auto-injector to be couriered to clinic.  Patient scheduled for [facility] new start on 03/11/21 @ 2:20pm  Chesley Mires, PharmD, MPH Clinical Pharmacist (Rheumatology and Pulmonology)

## 2021-03-05 NOTE — Telephone Encounter (Signed)
Paperwork will be received and patient will be called once completed.

## 2021-03-05 NOTE — Telephone Encounter (Signed)
Patient would like a follow up call when completed. Best # to call back [phone]

## 2021-03-05 NOTE — Telephone Encounter (Signed)
Copied from CRM [id number]. Topic: General - Call Back - No Documentation >> Mar 05, 2021 12:05 PM Randol Kern wrote: Elease Hashimoto called from [employer] is going to refax incontinence supplies request to PCP, please return asap.    Best contact: [phone]  Received and place in PCP Box

## 2021-03-08 ENCOUNTER — Other Ambulatory Visit (HOSPITAL_COMMUNITY): Payer: Self-pay

## 2021-03-08 NOTE — Telephone Encounter (Signed)
Shipment scheduled to deliver to clinic on 5/17

## 2021-03-09 NOTE — Telephone Encounter (Signed)
Shipment has been delivered to clinic.

## 2021-03-11 ENCOUNTER — Other Ambulatory Visit: Payer: Medicaid Other | Admitting: Pharmacist

## 2021-03-11 NOTE — Telephone Encounter (Signed)
Patient was no show to [place] new start appt today. This was the second no-show  Left VM requesting she call clinic if she'd like to r/s again  Chesley Mires, PharmD, MPH Clinical Pharmacist (Rheumatology and Pulmonology)

## 2021-03-12 NOTE — Telephone Encounter (Signed)
Called [employer] to check the status of appeal  Was advised that the appeal was withdrawn Pt did not sign and send back the necessary consent form they mailed her on 02/09/21  She states appeal will have to be restarted  Called [employer] to restart, was on hold approx 8 min, WCB

## 2021-03-16 ENCOUNTER — Encounter: Payer: Self-pay | Admitting: Emergency Medicine

## 2021-03-16 NOTE — Telephone Encounter (Signed)
Called and spoke to pt. Pt states she never received a letter from [facility] requesting her consent. Called [employer] at [phone] and spoke to rep. She initiated a new appeal for pt's Breo. Will need to fax a new letter to Appeals Dept at [phone] stating she has tried and failed Symbicort and Dulera. Pts ID: [id number]. Reference call #: [id number].   Will forward back to triage to fax letter (alread created).

## 2021-03-16 NOTE — Telephone Encounter (Signed)
Printed letter and faxed to number listed [phone]) with success result. Called and updated patient that letter was faxed to [facility] Department.

## 2021-03-16 NOTE — Telephone Encounter (Signed)
Patient has made a call regarding the completion of this paperwork   Patient would like to know if they need to submit another copy of the paperwork   Please contact to further advise when possible

## 2021-03-16 NOTE — Telephone Encounter (Signed)
Patient has made an additional call regarding the paperwork   Patient would like to be contacted to discuss further   Patient has spoken with [employer] and been told that they have not received the paperwork from the practice   Please contact to advise

## 2021-03-17 NOTE — Telephone Encounter (Signed)
Patient was called and informed of appointment needed for office notes.

## 2021-03-18 ENCOUNTER — Ambulatory Visit: Payer: Medicaid Other | Attending: Family Medicine

## 2021-03-18 ENCOUNTER — Other Ambulatory Visit: Payer: Self-pay

## 2021-03-18 DIAGNOSIS — Z23 Encounter for immunization: Secondary | ICD-10-CM | POA: Diagnosis not present

## 2021-03-18 NOTE — Progress Notes (Signed)
Pt arrived for 2nd shingles vaccine. Pt was given shingles vaccine in RD Pt tolerated shot well.

## 2021-03-21 ENCOUNTER — Emergency Department (HOSPITAL_COMMUNITY): Payer: Medicaid Other

## 2021-03-21 ENCOUNTER — Emergency Department (HOSPITAL_COMMUNITY)
Admission: EM | Admit: 2021-03-21 | Discharge: 2021-03-21 | Disposition: A | Discharge status: Home / Self Care | Payer: Medicaid Other | Attending: Emergency Medicine | Admitting: Emergency Medicine

## 2021-03-21 ENCOUNTER — Other Ambulatory Visit: Payer: Self-pay

## 2021-03-21 ENCOUNTER — Encounter (HOSPITAL_COMMUNITY): Payer: Self-pay | Admitting: Emergency Medicine

## 2021-03-21 DIAGNOSIS — Z7984 Long term (current) use of oral hypoglycemic drugs: Secondary | ICD-10-CM | POA: Diagnosis not present

## 2021-03-21 DIAGNOSIS — Z96652 Presence of left artificial knee joint: Secondary | ICD-10-CM | POA: Insufficient documentation

## 2021-03-21 DIAGNOSIS — Z91040 Latex allergy status: Secondary | ICD-10-CM | POA: Insufficient documentation

## 2021-03-21 DIAGNOSIS — J449 Chronic obstructive pulmonary disease, unspecified: Secondary | ICD-10-CM | POA: Insufficient documentation

## 2021-03-21 DIAGNOSIS — R0602 Shortness of breath: Secondary | ICD-10-CM

## 2021-03-21 DIAGNOSIS — R079 Chest pain, unspecified: Secondary | ICD-10-CM | POA: Diagnosis not present

## 2021-03-21 DIAGNOSIS — I509 Heart failure, unspecified: Secondary | ICD-10-CM | POA: Insufficient documentation

## 2021-03-21 DIAGNOSIS — J4541 Moderate persistent asthma with (acute) exacerbation: Secondary | ICD-10-CM | POA: Insufficient documentation

## 2021-03-21 DIAGNOSIS — R0682 Tachypnea, not elsewhere classified: Secondary | ICD-10-CM | POA: Diagnosis not present

## 2021-03-21 DIAGNOSIS — Z7982 Long term (current) use of aspirin: Secondary | ICD-10-CM | POA: Diagnosis not present

## 2021-03-21 DIAGNOSIS — R0789 Other chest pain: Secondary | ICD-10-CM | POA: Diagnosis not present

## 2021-03-21 DIAGNOSIS — I110 Hypertensive heart disease with heart failure: Secondary | ICD-10-CM | POA: Insufficient documentation

## 2021-03-21 DIAGNOSIS — Z87891 Personal history of nicotine dependence: Secondary | ICD-10-CM | POA: Diagnosis not present

## 2021-03-21 DIAGNOSIS — Z20822 Contact with and (suspected) exposure to covid-19: Secondary | ICD-10-CM | POA: Diagnosis not present

## 2021-03-21 DIAGNOSIS — R062 Wheezing: Secondary | ICD-10-CM | POA: Diagnosis not present

## 2021-03-21 DIAGNOSIS — J811 Chronic pulmonary edema: Secondary | ICD-10-CM | POA: Diagnosis not present

## 2021-03-21 DIAGNOSIS — Z79899 Other long term (current) drug therapy: Secondary | ICD-10-CM | POA: Diagnosis not present

## 2021-03-21 DIAGNOSIS — R0689 Other abnormalities of breathing: Secondary | ICD-10-CM | POA: Diagnosis not present

## 2021-03-21 LAB — BASIC METABOLIC PANEL
Anion gap: 7 (ref 5–15)
BUN: 13 mg/dL (ref 6–20)
CO2: 25 mmol/L (ref 22–32)
Calcium: 8.7 mg/dL — ABNORMAL LOW (ref 8.9–10.3)
Chloride: 106 mmol/L (ref 98–111)
Creatinine, Ser: 0.94 mg/dL (ref 0.44–1.00)
GFR, Estimated: >60 mL/min (ref >60)
Glucose, Bld: 99 mg/dL (ref 70–99)
Potassium: 4.4 mmol/L (ref 3.5–5.1)
Sodium: 138 mmol/L (ref 135–145)

## 2021-03-21 LAB — CBC
HCT: 45.7 % (ref 36.0–46.0)
Hemoglobin: 14.4 g/dL (ref 12.0–15.0)
MCH: 27.8 pg (ref 26.0–34.0)
MCHC: 31.5 g/dL (ref 30.0–36.0)
MCV: 88.2 fL (ref 80.0–100.0)
Platelets: 301 10*3/uL (ref 150–400)
RBC: 5.18 MIL/uL — ABNORMAL HIGH (ref 3.87–5.11)
RDW: 15.8 % — ABNORMAL HIGH (ref 11.5–15.5)
WBC: 6 10*3/uL (ref 4.0–10.5)
nRBC: 0 % (ref 0.0–0.2)

## 2021-03-21 LAB — RESP PANEL BY RT-PCR (FLU A&B, COVID) ARPGX2
Influenza A by PCR: NEGATIVE
Influenza B by PCR: NEGATIVE
SARS Coronavirus 2 by RT PCR: NEGATIVE

## 2021-03-21 LAB — TROPONIN I (HIGH SENSITIVITY)
Troponin I (High Sensitivity): 4 ng/L (ref <18)
Troponin I (High Sensitivity): 3 ng/L (ref <18)

## 2021-03-21 LAB — I-STAT BETA HCG BLOOD, ED (MC, WL, AP ONLY): I-stat hCG, quantitative: <5 m[IU]/mL (ref <5)

## 2021-03-21 MED ORDER — PREDNISONE 10 MG PO TABS: ORAL_TABLET | ORAL | 0 refills | Status: DC

## 2021-03-21 MED ORDER — IPRATROPIUM-ALBUTEROL 0.5-2.5 (3) MG/3ML IN SOLN
3.0000 mL | Freq: Once | RESPIRATORY_TRACT | Status: AC
  Administered 2021-03-21: 3 mL via RESPIRATORY_TRACT
  Filled 2021-03-21: qty 3

## 2021-03-21 MED ORDER — MAGNESIUM SULFATE 2 GM/50ML IV SOLN
2.0000 g | Freq: Once | INTRAVENOUS | Status: AC
  Administered 2021-03-21: 2 g via INTRAVENOUS
  Filled 2021-03-21: qty 50

## 2021-03-21 MED ORDER — ALBUTEROL SULFATE HFA 108 (90 BASE) MCG/ACT IN AERS
8.0000 | INHALATION_SPRAY | Freq: Once | RESPIRATORY_TRACT | Status: AC
  Administered 2021-03-21: 8 via RESPIRATORY_TRACT
  Filled 2021-03-21: qty 6.7

## 2021-03-21 NOTE — ED Provider Notes (Signed)
[facility] EMERGENCY DEPARTMENT Provider Note   CSN: [id number] Arrival date & time: 03/21/21  1319     History No chief complaint on file.   Kathleen Rodriguez is a 55 y.o. female.  She has a past medical history of asthma which is required intubation and BiPAP in the past.  Complaining of an asthma flareup that started 3 days ago with some tightness in her chest shortness of breath dyspnea on exertion.  Cough productive of some white sputum.  No fevers or chills.  She stopped smoking a year ago.  Sick contacts with her son having an upper respiratory infection.  She is COVID vaccinated.  She has been using her breathing treatments without any improvement.  She was given a DuoNeb x2 by EMS and Solu-Medrol 125 mg IV.  She does not feel any improvement.  She is speaking in full sentences.  Audible wheezing.  The history is provided by the patient.  Shortness of Breath Severity:  Severe Onset quality:  Gradual Timing:  Constant Progression:  Unchanged Chronicity:  Recurrent Context: activity   Relieved by:  Nothing Worsened by:  Activity and coughing Ineffective treatments:  Inhaler Associated symptoms: chest pain, cough, sputum production and wheezing   Associated symptoms: no abdominal pain, no fever, no headaches, no hemoptysis, no rash, no sore throat, no syncope and no vomiting   Risk factors: no tobacco use        Past Medical History:  Diagnosis Date  . Arthritis    "knees, lower back; legs, ankles" (01/27/2016)  . Asthma    followed by Dr. Halford Chessman  . Asthma   . CHF (congestive heart failure) ([place]) 2016   "when I went into a coma"  . Cocaine abuse ([place])   . 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 Diabetes mellitus without complication ([place])   . Dyspnea   . GERD (gastroesophageal reflux disease)   . Hypertension    "doctor took me off RX in 2016" (01/27/2016)  . Hypertension   . Influenza B April 8828   Complicated by multi-organ failure  . OSA on CPAP  "since " 03/20/2013  . Pneumonia 2016  . Required emergent intubation    asthma exacerbation in 2016  . Sleep apnea   . Tobacco abuse   . Upper airway cough syndrome     Patient Active Problem List   Diagnosis Date Noted  . Asthma exacerbation 01/20/2021  . DM2 (diabetes mellitus, type 2) ([place]) 01/20/2021  . OSA (obstructive sleep apnea) 01/20/2021  . Essential hypertension 01/20/2021  . Obesity (BMI 30-39.9) 01/20/2021  . Cocaine abuse ([place]) 01/20/2021  . Toe fracture, left 06/22/2020  . Prediabetes 10/30/2019  . Hyperglycemia 10/10/2019  . Acute asthma exacerbation 10/09/2019  . 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 Oral thrush 07/04/2019  . Medication management 07/04/2019  . Chest pain 12/25/2018  . Acute-on-chronic respiratory failure ([place]) 12/24/2018  . Skin ulcer ([place]) secondary to bullous impetigo Marilynn Rail 08/29/2018  . Asthma 10/22/2017  . Osteoarthritis of left knee 09/01/2016  . 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 Tobacco abuse   . Primary osteoarthritis of left knee 03/09/2016  . Non compliance with medical treatment 08/19/2015  . Anemia, iron deficiency 08/12/2015  . Essential hypertension 07/25/2015  . COPD exacerbation ([place]) 12/28/2014  . Dysfunctional uterine bleeding 12/28/2014  . Anemia 12/28/2014  . Morbid obesity ([place]) 05/16/2014  . Chronic cough 05/01/2014  . Upper airway cough  syndrome 05/01/2014  . GERD (gastroesophageal reflux disease) 04/08/2014  . Asthma exacerbation 04/06/2014  . OSA (obstructive sleep apnea) 03/20/2013  . Uncontrolled persistent asthma 02/05/2013    Past Surgical History:  Procedure Laterality Date  . [place]   as a teenager , cyst was benign  . CESAREAN SECTION  2006  . LACERATION REPAIR Right ~ 1997   "tried to cut myself"  . TOTAL KNEE ARTHROPLASTY Left 09/01/2016   Procedure: LEFT TOTAL KNEE ARTHROPLASTY;  Surgeon: Leandrew Koyanagi, MD;  Location:  [facility];  Service: Orthopedics;  Laterality: Left;  . TUBAL LIGATION  2006     OB History   No obstetric history on file.     Family History  Problem Relation Age of Onset  . Asthma Mother   . Heart murmur Mother   . Cancer Maternal Grandmother   . Heart disease Maternal Grandmother   . Hypertension Maternal Grandmother   . Cancer Paternal Grandmother   . Hypertension Mother   . HIV/AIDS Father     Social History   Tobacco Use  . Smoking status: Former Smoker    Packs/day: 0.25    Years: 21.00    Pack years: 5.25    Types: Cigarettes    Start date: 47    Quit date: 01/31/2013    Years since quitting: 8.1  . Smokeless tobacco: Never Used  Vaping Use  . Vaping Use: Never used  Substance Use Topics  . Drug use: Not Currently    Types: Cocaine, "Crack" cocaine    Comment: 01/27/2016 "clean from smoking crack since 2007"    Home Medications Prior to Admission medications   Medication Sig Start Date End Date Taking? Authorizing Provider  acetaminophen (TYLENOL) 500 MG tablet Take 500 mg by mouth every 6 (six) hours as needed for mild pain.    [provider]  albuterol (PROVENTIL) (2.5 MG/3ML) 0.083% nebulizer solution Take 3 mLs (2.5 mg total) by nebulization every 6 (six) hours as needed for wheezing or shortness of breath. 10/13/20   Chesley Mires, MD  albuterol (PROVENTIL) (2.5 MG/3ML) 0.083% nebulizer solution Take 2.5 mg by nebulization every 6 (six) hours as needed for shortness of breath or wheezing. Patient not taking: No sig reported 10/18/20   [provider]  albuterol (VENTOLIN HFA) 108 (90 Base) MCG/ACT inhaler Inhale 1 puff into the lungs every 4 (four) hours as needed for wheezing or shortness of breath.    [provider]  aspirin 81 MG EC tablet Take 1 tablet (81 mg total) by mouth daily. 02/07/19   [name], MD  aspirin EC 81 MG tablet Take 81 mg by mouth daily. Swallow whole. Patient not taking: No sig reported    [provider]  Benralizumab (FASENRA PEN) 30 MG/ML SOAJ Inject 1 pen into the skin every 4 weeks for 3 doses. Deliver to clinic: [facility], [place], [place] [address] 03/05/21   Chesley Mires, MD  Blood Glucose Monitoring Suppl (ACCU-CHEK GUIDE ME) w/Device KIT 1 each by Does not apply route daily. Dx; Prediabetets; Prednisone use 10/30/19   Newlin, Charlane Ferretti, MD  budesonide (PULMICORT) 0.25 MG/2ML nebulizer solution TAKE 1 VIAL (0.25 MG TOTAL) BY NEBULIZATION 2 (TWO) TIMES DAILY. 01/21/21 01/21/22  Barb Merino, MD  cephALEXin (KEFLEX) 500 MG capsule Take 1 capsule (500 mg total) by mouth 3 (three) times daily. Patient not taking: Reported on 03/01/2021 12/24/20   Bronson Ing, DPM  cloNIDine (CATAPRES) 0.1  MG tablet Take 1 tablet (0.1 mg total) by mouth 2 (two) times daily. Must keep office visit tomorrow for additional refills. Patient taking differently: Take 0.2 mg by mouth 2 (two) times daily. Must keep office visit tomorrow for additional refills. 10/29/19   [name], MD  diclofenac Sodium (VOLTAREN) 1 % GEL Apply 2 g topically 4 (four) times daily. Patient taking differently: Apply 2 g topically 4 (four) times daily. Using as needed 12/02/20   [name], MD  EPINEPHrine 0.3 mg/0.3 mL IJ SOAJ injection Inject 0.3 mg into the muscle as needed for anaphylaxis. 11/16/20   Parrett, Fonnie Mu, NP  fluconazole (DIFLUCAN) 100 MG tablet TAKE 1 TABLET (100 MG TOTAL) BY MOUTH ONCE FOR 1 DOSE. Patient not taking: Reported on 03/01/2021 01/21/21 01/21/22  Barb Merino, MD  fluticasone [facility]) 50 MCG/ACT nasal spray Place 1 spray into both nostrils daily as needed for allergies. 01/04/21   Chesley Mires, MD  fluticasone furoate-vilanterol (BREO ELLIPTA) 200-25 MCG/INH AEPB Inhale 1 puff into the lungs daily. 10/29/20   Parrett, Fonnie Mu, NP  gabapentin (NEURONTIN) 300 MG capsule Take 2 capsules (600 mg total) by mouth 3 (three) times daily. 12/11/20   [name], MD  gabapentin (NEURONTIN)  600 MG tablet Take 600 mg by mouth 3 (three) times daily. Patient not taking: No sig reported    [provider]  glucose blood (ACCU-CHEK GUIDE) test strip Use as instructed daily before breakfast. Dx; Prediabetets; Prednisone use 10/30/19   [name], MD  ipratropium (ATROVENT) 0.02 % nebulizer solution Take 2.5 mLs (0.5 mg total) by nebulization 2 (two) times daily as needed for wheezing or shortness of breath. 10/11/19   Chesley Mires, MD  loratadine (CLARITIN) 10 MG tablet Take 1 tablet (10 mg total) by mouth daily. 09/09/19   Chesley Mires, MD  loratadine (CLARITIN) 10 MG tablet Take 10 mg by mouth daily. Patient not taking: No sig reported    [provider]  meloxicam (MOBIC) 7.5 MG tablet Take 1 tablet (7.5 mg total) by mouth daily. 04/23/19   [name], MD  Menthol (HALLS COUGH DROPS MT) Use as directed 1 drop in the mouth or throat 2 (two) times daily as needed (sore throat and cough). Patient not taking: No sig reported    [provider]  metFORMIN (GLUCOPHAGE) 500 MG tablet TAKE 1 TABLET BY MOUTH 2 TIMES DAILY WITH A MEAL. 01/22/21   [name], MD  metFORMIN (GLUCOPHAGE) 500 MG tablet Take 500 mg by mouth 2 (two) times daily. Patient not taking: No sig reported 01/09/21   [provider]  methocarbamol (ROBAXIN) 500 MG tablet Take 1 tablet (500 mg total) by mouth every 8 (eight) hours as needed for muscle spasms. Patient not taking: Reported on 03/01/2021 12/02/20   [name], MD  methocarbamol (ROBAXIN) 500 MG tablet Take 500 mg by mouth 3 (three) times daily. 01/16/21   [provider]  Misc. Devices MISC Blood pressure monitor  Dx: Hypertension 02/07/19   [name], MD  montelukast (SINGULAIR) 10 MG tablet Take 1 tablet (10 mg total) by mouth at bedtime. 04/30/19   Martyn Ehrich, NP  montelukast (SINGULAIR) 10 MG tablet TAKE 1 TABLET (10 MG TOTAL) BY MOUTH AT BEDTIME. Patient not taking: Reported on 03/01/2021 01/21/21  01/21/22  Barb Merino, MD  omeprazole (PRILOSEC) 20 MG capsule Take 20 mg by mouth daily.    [provider]  pantoprazole (PROTONIX) 40 MG tablet Take 1 tablet (40 mg total)  by mouth daily at 12 noon. Patient not taking: Reported on 03/01/2021 07/06/20   Donne Hazel, MD  predniSONE (DELTASONE) 10 MG tablet 4 x 2 days, 3 x 2 days, 2 x 2 days, 1 x 2 days then stop Patient not taking: No sig reported 01/04/21   Chesley Mires, MD  predniSONE (DELTASONE) 10 MG tablet Take 4 tabs x 2 days, 3 tabs x 2 days, 2 tabs x 2 days, 1 tab x 2 days then stop Patient not taking: No sig reported 02/11/21   Baird Lyons D, MD  predniSONE (DELTASONE) 20 MG tablet Take 2 tablets daily with breakfast. Patient not taking: No sig reported 10/06/20   Jaynee Eagles, PA-C  PROAIR HFA 108 (90 Base) MCG/ACT inhaler TAKE 2 PUFFS BY MOUTH EVERY 6 HOURS AS NEEDED FOR WHEEZE OR SHORTNESS OF BREATH 11/04/20   Chesley Mires, MD  urea (CARMOL) 20 % cream Apply 1 application topically daily as needed (For dry feet). 10/15/19   [name], MD    Allergies    Tomato, Latex, Latex, Tomato, and Wool alcohol [lanolin]  Review of Systems   Review of Systems  Constitutional: Negative for fever.  HENT: Negative for sore throat.   Eyes: Negative for visual disturbance.  Respiratory: Positive for cough, sputum production, chest tightness, shortness of breath and wheezing. Negative for hemoptysis.   Cardiovascular: Positive for chest pain. Negative for syncope.  Gastrointestinal: Negative for abdominal pain and vomiting.  Genitourinary: Negative for dysuria.  Musculoskeletal: Negative for joint swelling.  Skin: Negative for rash.  Neurological: Negative for headaches.    Physical Exam Updated Vital Signs BP (!) 144/90 (BP Location: Right Arm)   Pulse 88   Temp 98.1 F (36.7 C) (Oral)   Resp (!) 24   SpO2 100%   Physical Exam Vitals and nursing note reviewed.  Constitutional:      General: She is not in acute  distress.    Appearance: Normal appearance. She is well-developed.  HENT:     Head: Normocephalic and atraumatic.  Eyes:     Conjunctiva/sclera: Conjunctivae normal.  Cardiovascular:     Rate and Rhythm: Normal rate and regular rhythm.     Heart sounds: No murmur heard.   Pulmonary:     Effort: Tachypnea and accessory muscle usage present. No respiratory distress.     Breath sounds: Wheezing present.  Abdominal:     Palpations: Abdomen is soft.     Tenderness: There is no abdominal tenderness.  Musculoskeletal:        General: No deformity.     Cervical back: Neck supple.     Right lower leg: No edema.     Left lower leg: No edema.  Skin:    General: Skin is warm and dry.  Neurological:     General: No focal deficit present.     Mental Status: She is alert.     ED Results / Procedures / Treatments   Labs (all labs ordered are listed, but only abnormal results are displayed) Labs Reviewed  BASIC METABOLIC PANEL - Abnormal; Notable for the following components:      Result Value   Calcium 8.7 (*)    All other components within normal limits  CBC - Abnormal; Notable for the following components:   RBC 5.18 (*)    RDW 15.8 (*)    All other components within normal limits  RESP PANEL BY RT-PCR (FLU A&B, COVID) ARPGX2  I-STAT BETA HCG BLOOD, ED ([facility], [facility],  [facility] ONLY)  TROPONIN I (HIGH SENSITIVITY)  TROPONIN I (HIGH SENSITIVITY)    EKG EKG Interpretation  Date/Time:  'Sunday Mar 21 2021 13:28:59 EDT Ventricular Rate:  100 PR Interval:  124 QRS Duration: 86 QT Interval:  348 QTC Calculation: 448 R Axis:   77 Text Interpretation: Normal sinus rhythm Normal ECG No significant change since prior 9/21 Confirmed by Kirin Pastorino ([id number]) on 03/21/2021 2:05:59 PM   Radiology DG Chest 1 View  Result Date: 03/21/2021 CLINICAL DATA:  Shortness of breath for 3 days. EXAM: CHEST  1 VIEW COMPARISON:  January 20, 2021 FINDINGS: Cardiomediastinal silhouette may be mildly enlarged.  Mediastinal contours appear intact. There is no evidence of focal airspace consolidation, pleural effusion or pneumothorax. Mild pulmonary vascular congestion. Osseous structures are without acute abnormality. Soft tissues are grossly normal. IMPRESSION: 1. Possible mildly enlarged cardiac silhouette. 2. Mild pulmonary vascular congestion. Electronically Signed   By: Dobrinka  Dimitrova M.D.   On: 03/21/2021 15:14    Procedures Procedures   Medications Ordered in ED Medications  albuterol (VENTOLIN HFA) 108 (90 Base) MCG/ACT inhaler 8 puff (8 puffs Inhalation Given 03/21/21 1556)  magnesium sulfate IVPB 2 g 50 mL (0 g Intravenous Stopped 03/21/21 1727)  ipratropium-albuterol (DUONEB) 0.5-2.5 (3) MG/3ML nebulizer solution 3 mL (3 mLs Nebulization Given 03/21/21 1752)    ED Course  I have reviewed the triage vital signs and the nursing notes.  Pertinent labs & imaging results that were available during my care of the patient were reviewed by me and considered in my medical decision making (see chart for details).  Clinical Course as of 03/21/21 2225  Sun Mar 21, 2021  1456 Chest x-ray does not show any acute infiltrates.  Awaiting radiology reading [MB]  1900 She said her breathing feels better now and she would like to be discharged.  She still has some increased work of breathing but she has had this for a long time so she knows which she can manage at home.  She is asking for prednisone taper. [MB]    Clinical Course User Index [MB] Leahna Hewson C, MD   MDM Rules/Calculators/A&P                         This patient complains of shortness of breath asthma exacerbation; this involves an extensive number of treatment Options and is a complaint that carries with it a high risk of complications and Morbidity. The differential includes asthma, COPD, pneumothorax, pneumonia, COVID, flu, ACS, vascular, PE  I ordered, reviewed and interpreted labs, which included CBC with normal white count  normal hemoglobin, chemistries normal, troponins flat, COVID and flu testing negative I ordered medication IV magnesium, inhalational breathing treatments with some improvement in her symptoms I ordered imaging studies which included chest x-ray and I independently    visualized and interpreted imaging which showed no acute findings Previous records obtained and reviewed in epic, multiple similar presentations for similar.  After the interventions stated above, I reevaluated the patient and found patient with improved air movement.  She feels she can manage her symptoms at home.  Pulse ox adequate and stable respiratory rate.  Return instructions discussed.   Final Clinical Impression(s) / ED Diagnoses Final diagnoses:  Moderate persistent asthma with exacerbation    Rx / DC Orders ED Discharge Orders         Ordered    predniSONE (DELTASONE) 10 MG tablet        05' /29/22  1903           Hayden Rasmussen, MD 03/21/21 2227

## 2021-03-21 NOTE — ED Triage Notes (Addendum)
Pt to triage via [facility] from home.  History of asthma.  Reports flare-up over the last 3 days.  Wheezing and rhonchi throughout.  Duo-neb x 2 by EMS.  Solu-medrol 125 mg IV.  20 g LAC.  CBG 94.  Pt denies improvement after medications.  Reports tightness across chest and SOB.

## 2021-03-21 NOTE — Discharge Instructions (Addendum)
You are seen in the emergency department for worsening of your asthma.  Your chest x-ray did not show any obvious pneumonia and your COVID and flu test were negative.  Your symptoms improved with some breathing treatments.  Please continue breathing treatments at home and finish your steroid taper.  Follow-up with your primary care doctor.  Return to the emergency department if any worsening or concerning symptoms

## 2021-03-21 NOTE — ED Notes (Signed)
Patient transported to X-ray 

## 2021-03-21 NOTE — ED Provider Notes (Signed)
Emergency Medicine Provider Triage Evaluation Note  JALIYAH FOTHERINGHAM , a 55 y.o. female  was evaluated in triage.  Pt complains of sob.  Review of Systems  Positive: Wheezing, sob,  Negative: Fever, cp  Physical Exam  BP (!) 144/90 (BP Location: Right Arm)   Pulse 88   Temp 98.1 F (36.7 C) (Oral)   Resp (!) 24   SpO2 100%  Gen:   Awake, no distress   Resp:  Scattered wheezes and rhonchi heard MSK:   Moves extremities without difficulty  Other:    Medical Decision Making  Medically screening exam initiated at 1:35 PM.  Appropriate orders placed.  VANCE BELCOURT was informed that the remainder of the evaluation will be completed by another provider, this initial triage assessment does not replace that evaluation, and the importance of remaining in the ED until their evaluation is complete.  Hx asthma, worsening SOB and wheezes x 3 days, triggers likely due to house cleaning agent.     Fayrene Helper, PA-C 03/21/21 1338    Terrilee Files, MD 03/21/21 2228

## 2021-03-22 ENCOUNTER — Telehealth: Payer: Self-pay

## 2021-03-22 NOTE — Telephone Encounter (Signed)
Transition Care Management Unsuccessful Follow-up Telephone Call  Date of discharge and from where:  03/21/2021 from [facility]  Attempts:  1st Attempt  Reason for unsuccessful TCM follow-up call:  Left voice message

## 2021-03-23 ENCOUNTER — Other Ambulatory Visit: Payer: Self-pay | Admitting: Obstetrics and Gynecology

## 2021-03-23 ENCOUNTER — Ambulatory Visit: Payer: Medicaid Other | Attending: Family Medicine | Admitting: Family Medicine

## 2021-03-23 ENCOUNTER — Other Ambulatory Visit: Payer: Self-pay

## 2021-03-23 DIAGNOSIS — N3946 Mixed incontinence: Secondary | ICD-10-CM

## 2021-03-23 MED ORDER — MISC. DEVICES MISC: 11 refills | Status: DC

## 2021-03-23 NOTE — Patient Outreach (Signed)
Care Coordination  03/23/2021  Kathleen Rodriguez [birth year] [id number]   RNCM called patient at scheduled time.  Patient on her way to pharmacy to pick up medications-asked to be called another day/time.  RNCM rescheduled appointment at patient's request.  [name] RN, BSN [place]  [facility] Care Management Coordinator - Managed [place] High Risk [phone].

## 2021-03-23 NOTE — Progress Notes (Signed)
Virtual Visit via Telephone Note  I connected with Kathleen Rodriguez, on 03/23/2021 at 8:16 AM by telephone due to the COVID-19 pandemic and verified that I am speaking with the correct person using two identifiers.   Consent: I discussed the limitations, risks, security and privacy concerns of performing an evaluation and management service by telephone and the availability of in person appointments. I also discussed with the patient that there may be a patient responsible charge related to this service. The patient expressed understanding and agreed to proceed.   Location of Patient: Home  Location of Provider: Clinic   Persons participating in Telemedicine visit: Kathleen Rodriguez Dr. Margarita Rana     History of Present Illness: Kathleen Rodriguez is a 55 year old female with a history of asthma, obstructive sleep apnea (on CPAP), bilateral knee osteoarthritis (status post left total knee arthroplasty in 08/2016)seen today for a visit for incontinence supplies. She has urine and stress incontinence and uses Depends for this. Also requires pads as well. Urinary incontinence symptoms have been present for years.   Past Medical History:  Diagnosis Date  . Arthritis    "knees, lower back; legs, ankles" (01/27/2016)  . Asthma    followed by Dr. Halford Chessman  . Asthma   . CHF (congestive heart failure) ([place]) 2016   "when I went into a coma"  . Cocaine abuse ([place])   . 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 Diabetes mellitus without complication ([place])   . Dyspnea   . GERD (gastroesophageal reflux disease)   . Hypertension    "doctor took me off RX in 2016" (01/27/2016)  . Hypertension   . Influenza B April 9417   Complicated by multi-organ failure  . OSA on CPAP "since " 03/20/2013  . Pneumonia 2016  . Required emergent intubation    asthma exacerbation in 2016  . Sleep apnea   . Tobacco abuse   . Upper airway cough syndrome    Allergies  Allergen Reactions  . Tomato Hives, Itching and  Other (See Comments)    ALSO REACTS TO KETCHUP  . Latex Itching and Rash  . Latex Hives and Rash  . Tomato Hives and Rash  . Wool Alcohol [Lanolin] Itching    Current Outpatient Medications on File Prior to Visit  Medication Sig Dispense Refill  . acetaminophen (TYLENOL) 500 MG tablet Take 500 mg by mouth every 6 (six) hours as needed for mild pain.    Marland Kitchen albuterol (PROVENTIL) (2.5 MG/3ML) 0.083% nebulizer solution Take 3 mLs (2.5 mg total) by nebulization every 6 (six) hours as needed for wheezing or shortness of breath. 360 mL 0  . albuterol (PROVENTIL) (2.5 MG/3ML) 0.083% nebulizer solution Take 2.5 mg by nebulization every 6 (six) hours as needed for shortness of breath or wheezing. (Patient not taking: No sig reported)    . albuterol (VENTOLIN HFA) 108 (90 Base) MCG/ACT inhaler Inhale 1 puff into the lungs every 4 (four) hours as needed for wheezing or shortness of breath.    Marland Kitchen aspirin 81 MG EC tablet Take 1 tablet (81 mg total) by mouth daily. 30 tablet 3  . aspirin EC 81 MG tablet Take 81 mg by mouth daily. Swallow whole. (Patient not taking: No sig reported)    . Benralizumab (FASENRA PEN) 30 MG/ML SOAJ Inject 1 pen into the skin every 4 weeks for 3 doses. Deliver to clinic: [place], [place] [address] 3 mL 0  . Blood Glucose Monitoring Suppl (  ACCU-CHEK GUIDE ME) w/Device KIT 1 each by Does not apply route daily. Dx; Prediabetets; Prednisone use 1 kit 0  . budesonide (PULMICORT) 0.25 MG/2ML nebulizer solution TAKE 1 VIAL (0.25 MG TOTAL) BY NEBULIZATION 2 (TWO) TIMES DAILY. 60 mL 12  . cephALEXin (KEFLEX) 500 MG capsule Take 1 capsule (500 mg total) by mouth 3 (three) times daily. (Patient not taking: Reported on 03/01/2021) 30 capsule 0  . cloNIDine (CATAPRES) 0.1 MG tablet Take 1 tablet (0.1 mg total) by mouth 2 (two) times daily. Must keep office visit tomorrow for additional refills. (Patient taking differently: Take 0.2 mg by mouth 2 (two) times daily. Must keep  office visit tomorrow for additional refills.) 60 tablet 0  . diclofenac Sodium (VOLTAREN) 1 % GEL Apply 2 g topically 4 (four) times daily. (Patient taking differently: Apply 2 g topically 4 (four) times daily. Using as needed) 100 g 1  . EPINEPHrine 0.3 mg/0.3 mL IJ SOAJ injection Inject 0.3 mg into the muscle as needed for anaphylaxis. 1 each 2  . fluconazole (DIFLUCAN) 100 MG tablet TAKE 1 TABLET (100 MG TOTAL) BY MOUTH ONCE FOR 1 DOSE. (Patient not taking: Reported on 03/01/2021) 1 tablet 0  . fluticasone (FLONASE) 50 MCG/ACT nasal spray Place 1 spray into both nostrils daily as needed for allergies. 16 g 11  . fluticasone furoate-vilanterol (BREO ELLIPTA) 200-25 MCG/INH AEPB Inhale 1 puff into the lungs daily. 1 each 5  . gabapentin (NEURONTIN) 300 MG capsule Take 2 capsules (600 mg total) by mouth 3 (three) times daily. 180 capsule 3  . gabapentin (NEURONTIN) 600 MG tablet Take 600 mg by mouth 3 (three) times daily. (Patient not taking: No sig reported)    . glucose blood (ACCU-CHEK GUIDE) test strip Use as instructed daily before breakfast. Dx; Prediabetets; Prednisone use 30 each 12  . ipratropium (ATROVENT) 0.02 % nebulizer solution Take 2.5 mLs (0.5 mg total) by nebulization 2 (two) times daily as needed for wheezing or shortness of breath. 360 mL 5  . loratadine (CLARITIN) 10 MG tablet Take 1 tablet (10 mg total) by mouth daily. 30 tablet 11  . loratadine (CLARITIN) 10 MG tablet Take 10 mg by mouth daily. (Patient not taking: No sig reported)    . meloxicam (MOBIC) 7.5 MG tablet Take 1 tablet (7.5 mg total) by mouth daily. 30 tablet 1  . Menthol (HALLS COUGH DROPS MT) Use as directed 1 drop in the mouth or throat 2 (two) times daily as needed (sore throat and cough). (Patient not taking: No sig reported)    . metFORMIN (GLUCOPHAGE) 500 MG tablet TAKE 1 TABLET BY MOUTH 2 TIMES DAILY WITH A MEAL. 180 tablet 0  . metFORMIN (GLUCOPHAGE) 500 MG tablet Take 500 mg by mouth 2 (two) times daily.  (Patient not taking: No sig reported)    . methocarbamol (ROBAXIN) 500 MG tablet Take 1 tablet (500 mg total) by mouth every 8 (eight) hours as needed for muscle spasms. (Patient not taking: Reported on 03/01/2021) 60 tablet 0  . methocarbamol (ROBAXIN) 500 MG tablet Take 500 mg by mouth 3 (three) times daily.    . Misc. Devices MISC Blood pressure monitor  Dx: Hypertension 1 each 0  . montelukast (SINGULAIR) 10 MG tablet Take 1 tablet (10 mg total) by mouth at bedtime. 90 tablet 1  . montelukast (SINGULAIR) 10 MG tablet TAKE 1 TABLET (10 MG TOTAL) BY MOUTH AT BEDTIME. (Patient not taking: Reported on 03/01/2021) 30 tablet 0  . omeprazole (PRILOSEC) 20  MG capsule Take 20 mg by mouth daily.    . pantoprazole (PROTONIX) 40 MG tablet Take 1 tablet (40 mg total) by mouth daily at 12 noon. (Patient not taking: Reported on 03/01/2021) 30 tablet 0  . predniSONE (DELTASONE) 10 MG tablet Take 4 tabs x 2 days, 3 tabs x 2 days, 2 tabs x 2 days, 1 tab x 2 days then stop 20 tablet 0  . PROAIR HFA 108 (90 Base) MCG/ACT inhaler TAKE 2 PUFFS BY MOUTH EVERY 6 HOURS AS NEEDED FOR WHEEZE OR SHORTNESS OF BREATH 8.5 each 3  . urea (CARMOL) 20 % cream Apply 1 application topically daily as needed (For dry feet). 85 g 1   No current facility-administered medications on file prior to visit.    ROS: See HPI  Observations/Objective: Awake, alert, Oriented x3  Assessment and Plan: 1. Mixed stress and urge urinary incontinence - Misc. Devices MISC; Depends . Wt- 250 lbs, size 1X; Poise pads  Dispense: 3 each; Refill: 11   Follow Up Instructions: Keep previously scheduled appointment   I discussed the assessment and treatment plan with the patient. The patient was provided an opportunity to ask questions and all were answered. The patient agreed with the plan and demonstrated an understanding of the instructions.   The patient was advised to call back or seek an in-person evaluation if the symptoms worsen or if the  condition fails to improve as anticipated.     I provided 8 minutes total of non-face-to-face time during this encounter.   [name], MD, FAAFP. [facility] and [place], [place]   03/23/2021, 8:16 AM

## 2021-03-23 NOTE — Telephone Encounter (Signed)
Transition Care Management Unsuccessful Follow-up Telephone Call  Date of discharge and from where:  03/21/2021 from [facility] [address]  Attempts:  2nd Attempt  Reason for unsuccessful TCM follow-up call:  Unable to reach patient

## 2021-03-24 ENCOUNTER — Telehealth: Payer: Self-pay | Admitting: Family Medicine

## 2021-03-24 NOTE — Telephone Encounter (Signed)
Transition Care Management Follow-up Telephone Call Date of discharge and from where: 03/21/21 from [facility] How have you been since you were released from the hospital? Pt states that she is feeling much better today.  Any questions or concerns? No  Items Reviewed: Did the pt receive and understand the discharge instructions provided? Yes  Medications obtained and verified? Yes  Other? No  Any new allergies since your discharge? No  Dietary orders reviewed? N/a Do you have support at home? Yes   Functional Questionnaire: (I = Independent and D = Dependent) ADLs: I  Bathing/Dressing- I  Meal Prep- I  Eating- I  Maintaining continence- I  Transferring/Ambulation- I  Managing Meds- I   Follow up appointments reviewed:  PCP Hospital f/u appt confirmed? No   Specialist Hospital f/u appt confirmed? No   Are transportation arrangements needed? No  If their condition worsens, is the pt aware to call PCP or go to the Emergency Dept.? Yes Was the patient provided with contact information for the PCP's office or ED? Yes Was to pt encouraged to call back with questions or concerns? Yes

## 2021-03-24 NOTE — Telephone Encounter (Signed)
Copied from CRM [id number]. Topic: General - Other >> Mar 23, 2021 11:33 AM Jaquita Rector A wrote: Reason for CRM: Patient called in asking dr Delford Field nurse to please give her a call back at Ph# [phone] >> Mar 24, 2021 10:36 AM Marylen Ponto wrote: Pt called once again and asked that Dr. Delford Field nurse return her call. Pt stated this is an urgent matter and she really needs her call returned asap. Cb# [phone]   Patient just had a tele with Dr. Alvis Lemmings yesterday but has called twice since then stating she needs to speak with Dr. Delford Field nurse. Patient states urgent,  Please follow up.

## 2021-03-24 NOTE — Telephone Encounter (Signed)
I do not know this patient.  She was seen by dr Alvis Lemmings her pcp

## 2021-03-24 NOTE — Telephone Encounter (Signed)
Called pt stated she needs a form filled from Dr Delford Field for her to donate plasma. Pt stated she will fax the form at [facility] and will call back to f /u tomorrow.

## 2021-03-24 NOTE — Telephone Encounter (Signed)
Patient is calling again to ask that the nurse or doctor return her call.  Please advise.

## 2021-03-25 ENCOUNTER — Telehealth: Payer: Self-pay | Admitting: Family Medicine

## 2021-03-25 DIAGNOSIS — R32 Unspecified urinary incontinence: Secondary | ICD-10-CM | POA: Diagnosis not present

## 2021-03-25 DIAGNOSIS — E119 Type 2 diabetes mellitus without complications: Secondary | ICD-10-CM | POA: Diagnosis not present

## 2021-03-25 NOTE — Telephone Encounter (Signed)
Pt is calling and would like dr Delford Field to complete the form. Pt seen dr Delford Field in 2019 for her left leg. Pt states she drop of the form last week and its up front in envelope. Pt would like to donate plasma and would like the form to be complete asap. Pt said she just have a scar on that leg

## 2021-03-25 NOTE — Telephone Encounter (Signed)
Pt was called and set an appointment with Dr.Wright.

## 2021-03-25 NOTE — Telephone Encounter (Signed)
Patient states that she saw you on 09/05/2018 for skin ulcer and is needing dx and how it started for ulcer on her left leg. Patient is wanting to donate plasma and they are needing this information on their letter head. Patient has dropped of form I have form if you are willing to fill it out.

## 2021-03-25 NOTE — Telephone Encounter (Signed)
Orders have been signed.

## 2021-03-25 NOTE — Telephone Encounter (Signed)
Copied from CRM [id number]. Topic: General - Other >> Mar 25, 2021  8:40 AM Gaetana Michaelis A wrote: Reason for CRM: Toniann Fail with [employer] has called for an update on paperwork related to patient's incontinence supplies   If patient is in need of an appointment for the completion of paperwork please let [employer] know and they can help to facilitate further   Please contact to advise when possible

## 2021-03-25 NOTE — Telephone Encounter (Unsigned)
Copied from CRM [id number]. Topic: General - Other >> Mar 25, 2021 12:04 PM Gaetana Michaelis A wrote: Reason for CRM: Patient would like to be contacted by staff member "Alycia" when possible  Patient has additional concerns related to their plasma donation paperwork  Please contact to further advise when possible >> Mar 25, 2021  2:31 PM Jaquita Rector A wrote: Patient would like a call back soon as her paper is complete Ph# [phone]

## 2021-03-25 NOTE — Telephone Encounter (Signed)
I cannot do this without another face to face visit

## 2021-03-26 NOTE — Telephone Encounter (Signed)
Left VM with patient to see if she'd like to r/s Fasenra injection. Left direct office number on VM for her to reach out. Will deactivate in [place] for now until patient reaches back out  .dgs

## 2021-03-30 ENCOUNTER — Inpatient Hospital Stay (HOSPITAL_COMMUNITY)
Admission: EM | Admit: 2021-03-30 | Discharge: 2021-04-02 | DRG: 192 | Disposition: A | Discharge status: Home / Self Care | Payer: Medicaid Other | Attending: Family Medicine | Admitting: Family Medicine

## 2021-03-30 ENCOUNTER — Encounter (HOSPITAL_COMMUNITY): Payer: Self-pay

## 2021-03-30 DIAGNOSIS — R0902 Hypoxemia: Secondary | ICD-10-CM | POA: Diagnosis not present

## 2021-03-30 DIAGNOSIS — Z79899 Other long term (current) drug therapy: Secondary | ICD-10-CM

## 2021-03-30 DIAGNOSIS — I10 Essential (primary) hypertension: Secondary | ICD-10-CM | POA: Diagnosis present

## 2021-03-30 DIAGNOSIS — Z7984 Long term (current) use of oral hypoglycemic drugs: Secondary | ICD-10-CM

## 2021-03-30 DIAGNOSIS — J4551 Severe persistent asthma with (acute) exacerbation: Secondary | ICD-10-CM

## 2021-03-30 DIAGNOSIS — B379 Candidiasis, unspecified: Secondary | ICD-10-CM | POA: Diagnosis present

## 2021-03-30 DIAGNOSIS — Z20822 Contact with and (suspected) exposure to covid-19: Secondary | ICD-10-CM | POA: Diagnosis present

## 2021-03-30 DIAGNOSIS — Z96652 Presence of left artificial knee joint: Secondary | ICD-10-CM | POA: Diagnosis present

## 2021-03-30 DIAGNOSIS — R0602 Shortness of breath: Secondary | ICD-10-CM | POA: Diagnosis not present

## 2021-03-30 DIAGNOSIS — Z87891 Personal history of nicotine dependence: Secondary | ICD-10-CM

## 2021-03-30 DIAGNOSIS — K219 Gastro-esophageal reflux disease without esophagitis: Secondary | ICD-10-CM | POA: Diagnosis present

## 2021-03-30 DIAGNOSIS — R062 Wheezing: Secondary | ICD-10-CM | POA: Diagnosis not present

## 2021-03-30 DIAGNOSIS — Z825 Family history of asthma and other chronic lower respiratory diseases: Secondary | ICD-10-CM

## 2021-03-30 DIAGNOSIS — J45901 Unspecified asthma with (acute) exacerbation: Secondary | ICD-10-CM | POA: Diagnosis present

## 2021-03-30 DIAGNOSIS — Z7982 Long term (current) use of aspirin: Secondary | ICD-10-CM

## 2021-03-30 DIAGNOSIS — G4733 Obstructive sleep apnea (adult) (pediatric): Secondary | ICD-10-CM | POA: Diagnosis present

## 2021-03-30 DIAGNOSIS — Z91040 Latex allergy status: Secondary | ICD-10-CM

## 2021-03-30 DIAGNOSIS — Z830 Family history of human immunodeficiency virus [HIV] disease: Secondary | ICD-10-CM

## 2021-03-30 DIAGNOSIS — J80 Acute respiratory distress syndrome: Secondary | ICD-10-CM | POA: Diagnosis not present

## 2021-03-30 DIAGNOSIS — Z791 Long term (current) use of non-steroidal anti-inflammatories (NSAID): Secondary | ICD-10-CM

## 2021-03-30 DIAGNOSIS — Z7952 Long term (current) use of systemic steroids: Secondary | ICD-10-CM

## 2021-03-30 DIAGNOSIS — J441 Chronic obstructive pulmonary disease with (acute) exacerbation: Principal | ICD-10-CM | POA: Diagnosis present

## 2021-03-30 DIAGNOSIS — E119 Type 2 diabetes mellitus without complications: Secondary | ICD-10-CM | POA: Diagnosis present

## 2021-03-30 DIAGNOSIS — Z7951 Long term (current) use of inhaled steroids: Secondary | ICD-10-CM

## 2021-03-30 DIAGNOSIS — Z8249 Family history of ischemic heart disease and other diseases of the circulatory system: Secondary | ICD-10-CM

## 2021-03-30 MED ORDER — IPRATROPIUM BROMIDE 0.02 % IN SOLN
0.5000 mg | Freq: Once | RESPIRATORY_TRACT | Status: DC
  Filled 2021-03-30: qty 2.5

## 2021-03-30 MED ORDER — ALBUTEROL (5 MG/ML) CONTINUOUS INHALATION SOLN
10.0000 mg/h | INHALATION_SOLUTION | RESPIRATORY_TRACT | Status: DC
  Administered 2021-03-31: 10 mg/h via RESPIRATORY_TRACT
  Filled 2021-03-30: qty 20

## 2021-03-30 MED ORDER — ALBUTEROL (5 MG/ML) CONTINUOUS INHALATION SOLN
INHALATION_SOLUTION | RESPIRATORY_TRACT | Status: AC
  Filled 2021-03-30: qty 20

## 2021-03-30 MED ORDER — IPRATROPIUM BROMIDE 0.02 % IN SOLN
RESPIRATORY_TRACT | Status: AC
  Administered 2021-03-31: 0.5 mg
  Filled 2021-03-30: qty 2.5

## 2021-03-30 NOTE — ED Triage Notes (Signed)
Pt complains of being short of breath for three days, finally got prednisone from PCP but started it too late,  Pt sleeps with CPAP  Pt received a duoneb and mag in route

## 2021-03-31 ENCOUNTER — Emergency Department (HOSPITAL_COMMUNITY): Payer: Medicaid Other

## 2021-03-31 ENCOUNTER — Encounter (HOSPITAL_COMMUNITY): Payer: Self-pay | Admitting: Family Medicine

## 2021-03-31 ENCOUNTER — Other Ambulatory Visit: Payer: Self-pay

## 2021-03-31 DIAGNOSIS — Z87891 Personal history of nicotine dependence: Secondary | ICD-10-CM | POA: Diagnosis not present

## 2021-03-31 DIAGNOSIS — Z8249 Family history of ischemic heart disease and other diseases of the circulatory system: Secondary | ICD-10-CM | POA: Diagnosis not present

## 2021-03-31 DIAGNOSIS — Z830 Family history of human immunodeficiency virus [HIV] disease: Secondary | ICD-10-CM | POA: Diagnosis not present

## 2021-03-31 DIAGNOSIS — B379 Candidiasis, unspecified: Secondary | ICD-10-CM | POA: Diagnosis not present

## 2021-03-31 DIAGNOSIS — G4733 Obstructive sleep apnea (adult) (pediatric): Secondary | ICD-10-CM | POA: Diagnosis not present

## 2021-03-31 DIAGNOSIS — Z791 Long term (current) use of non-steroidal anti-inflammatories (NSAID): Secondary | ICD-10-CM | POA: Diagnosis not present

## 2021-03-31 DIAGNOSIS — R0602 Shortness of breath: Secondary | ICD-10-CM | POA: Diagnosis not present

## 2021-03-31 DIAGNOSIS — K219 Gastro-esophageal reflux disease without esophagitis: Secondary | ICD-10-CM | POA: Diagnosis not present

## 2021-03-31 DIAGNOSIS — J4551 Severe persistent asthma with (acute) exacerbation: Secondary | ICD-10-CM | POA: Diagnosis not present

## 2021-03-31 DIAGNOSIS — I10 Essential (primary) hypertension: Secondary | ICD-10-CM | POA: Diagnosis not present

## 2021-03-31 DIAGNOSIS — Z7984 Long term (current) use of oral hypoglycemic drugs: Secondary | ICD-10-CM | POA: Diagnosis not present

## 2021-03-31 DIAGNOSIS — Z7951 Long term (current) use of inhaled steroids: Secondary | ICD-10-CM | POA: Diagnosis not present

## 2021-03-31 DIAGNOSIS — Z91040 Latex allergy status: Secondary | ICD-10-CM | POA: Diagnosis not present

## 2021-03-31 DIAGNOSIS — J441 Chronic obstructive pulmonary disease with (acute) exacerbation: Secondary | ICD-10-CM | POA: Diagnosis not present

## 2021-03-31 DIAGNOSIS — R059 Cough, unspecified: Secondary | ICD-10-CM | POA: Diagnosis not present

## 2021-03-31 DIAGNOSIS — Z825 Family history of asthma and other chronic lower respiratory diseases: Secondary | ICD-10-CM | POA: Diagnosis not present

## 2021-03-31 DIAGNOSIS — Z79899 Other long term (current) drug therapy: Secondary | ICD-10-CM | POA: Diagnosis not present

## 2021-03-31 DIAGNOSIS — Z20822 Contact with and (suspected) exposure to covid-19: Secondary | ICD-10-CM | POA: Diagnosis not present

## 2021-03-31 DIAGNOSIS — Z7952 Long term (current) use of systemic steroids: Secondary | ICD-10-CM | POA: Diagnosis not present

## 2021-03-31 DIAGNOSIS — Z7982 Long term (current) use of aspirin: Secondary | ICD-10-CM | POA: Diagnosis not present

## 2021-03-31 DIAGNOSIS — E119 Type 2 diabetes mellitus without complications: Secondary | ICD-10-CM | POA: Diagnosis not present

## 2021-03-31 DIAGNOSIS — Z96652 Presence of left artificial knee joint: Secondary | ICD-10-CM | POA: Diagnosis not present

## 2021-03-31 LAB — CBC WITH DIFFERENTIAL/PLATELET
Abs Immature Granulocytes: 0.04 10*3/uL (ref 0.00–0.07)
Basophils Absolute: 0 10*3/uL (ref 0.0–0.1)
Basophils Relative: 0 %
Eosinophils Absolute: 0 10*3/uL (ref 0.0–0.5)
Eosinophils Relative: 0 %
HCT: 43.2 % (ref 36.0–46.0)
Hemoglobin: 13.5 g/dL (ref 12.0–15.0)
Immature Granulocytes: 1 %
Lymphocytes Relative: 7 %
Lymphs Abs: 0.5 10*3/uL — ABNORMAL LOW (ref 0.7–4.0)
MCH: 28.2 pg (ref 26.0–34.0)
MCHC: 31.3 g/dL (ref 30.0–36.0)
MCV: 90.2 fL (ref 80.0–100.0)
Monocytes Absolute: 0 10*3/uL — ABNORMAL LOW (ref 0.1–1.0)
Monocytes Relative: 1 %
Neutro Abs: 7.3 10*3/uL (ref 1.7–7.7)
Neutrophils Relative %: 91 %
Platelets: 281 10*3/uL (ref 150–400)
RBC: 4.79 MIL/uL (ref 3.87–5.11)
RDW: 16 % — ABNORMAL HIGH (ref 11.5–15.5)
WBC: 8 10*3/uL (ref 4.0–10.5)
nRBC: 0 % (ref 0.0–0.2)

## 2021-03-31 LAB — BASIC METABOLIC PANEL
Anion gap: 11 (ref 5–15)
BUN: 17 mg/dL (ref 6–20)
CO2: 22 mmol/L (ref 22–32)
Calcium: 8.7 mg/dL — ABNORMAL LOW (ref 8.9–10.3)
Chloride: 105 mmol/L (ref 98–111)
Creatinine, Ser: 1.03 mg/dL — ABNORMAL HIGH (ref 0.44–1.00)
GFR, Estimated: >60 mL/min (ref >60)
Glucose, Bld: 222 mg/dL — ABNORMAL HIGH (ref 70–99)
Potassium: 3.7 mmol/L (ref 3.5–5.1)
Sodium: 138 mmol/L (ref 135–145)

## 2021-03-31 LAB — RAPID URINE DRUG SCREEN, HOSP PERFORMED
Amphetamines: NOT DETECTED
Barbiturates: NOT DETECTED
Benzodiazepines: NOT DETECTED
Cocaine: NOT DETECTED
Opiates: NOT DETECTED
Tetrahydrocannabinol: NOT DETECTED

## 2021-03-31 LAB — CBC
HCT: 41.2 % (ref 36.0–46.0)
Hemoglobin: 13.1 g/dL (ref 12.0–15.0)
MCH: 28.2 pg (ref 26.0–34.0)
MCHC: 31.8 g/dL (ref 30.0–36.0)
MCV: 88.8 fL (ref 80.0–100.0)
Platelets: 266 10*3/uL (ref 150–400)
RBC: 4.64 MIL/uL (ref 3.87–5.11)
RDW: 16.3 % — ABNORMAL HIGH (ref 11.5–15.5)
WBC: 8.5 10*3/uL (ref 4.0–10.5)
nRBC: 0 % (ref 0.0–0.2)

## 2021-03-31 LAB — BLOOD GAS, VENOUS
Acid-base deficit: 4.4 mmol/L — ABNORMAL HIGH (ref 0.0–2.0)
Bicarbonate: 22.4 mmol/L (ref 20.0–28.0)
O2 Saturation: 79.6 %
Patient temperature: 98.6
pCO2, Ven: 50.5 mmHg (ref 44.0–60.0)
pH, Ven: 7.27 (ref 7.250–7.430)
pO2, Ven: 52 mmHg — ABNORMAL HIGH (ref 32.0–45.0)

## 2021-03-31 LAB — CBG MONITORING, ED: Glucose-Capillary: 120 mg/dL — ABNORMAL HIGH (ref 70–99)

## 2021-03-31 LAB — GLUCOSE, CAPILLARY
Glucose-Capillary: 190 mg/dL — ABNORMAL HIGH (ref 70–99)
Glucose-Capillary: 156 mg/dL — ABNORMAL HIGH (ref 70–99)

## 2021-03-31 LAB — CREATININE, SERUM
Creatinine, Ser: 0.8 mg/dL (ref 0.44–1.00)
GFR, Estimated: >60 mL/min (ref >60)

## 2021-03-31 LAB — RESP PANEL BY RT-PCR (FLU A&B, COVID) ARPGX2
Influenza A by PCR: NEGATIVE
Influenza B by PCR: NEGATIVE
SARS Coronavirus 2 by RT PCR: NEGATIVE

## 2021-03-31 MED ORDER — ALBUTEROL (5 MG/ML) CONTINUOUS INHALATION SOLN
10.0000 mg/h | INHALATION_SOLUTION | RESPIRATORY_TRACT | Status: DC
  Administered 2021-03-31: 10 mg/h via RESPIRATORY_TRACT
  Filled 2021-03-31: qty 20

## 2021-03-31 MED ORDER — ENOXAPARIN SODIUM 40 MG/0.4ML IJ SOSY: 40.0000 mg | PREFILLED_SYRINGE | INTRAMUSCULAR | Status: DC

## 2021-03-31 MED ORDER — ASPIRIN EC 81 MG PO TBEC
81.0000 mg | DELAYED_RELEASE_TABLET | Freq: Every day | ORAL | Status: DC
  Administered 2021-04-01 – 2021-04-02 (×2): 81 mg via ORAL
  Filled 2021-03-31 (×2): qty 1

## 2021-03-31 MED ORDER — IPRATROPIUM-ALBUTEROL 0.5-2.5 (3) MG/3ML IN SOLN
3.0000 mL | Freq: Four times a day (QID) | RESPIRATORY_TRACT | Status: DC
  Administered 2021-03-31 (×2): 3 mL via RESPIRATORY_TRACT
  Filled 2021-03-31 (×2): qty 3

## 2021-03-31 MED ORDER — HYDROCOD POLST-CPM POLST ER 10-8 MG/5ML PO SUER: 5.0000 mL | Freq: Two times a day (BID) | ORAL | Status: DC | PRN

## 2021-03-31 MED ORDER — HYDRALAZINE HCL 20 MG/ML IJ SOLN: 10.0000 mg | Freq: Three times a day (TID) | INTRAMUSCULAR | Status: DC | PRN

## 2021-03-31 MED ORDER — IPRATROPIUM-ALBUTEROL 0.5-2.5 (3) MG/3ML IN SOLN
3.0000 mL | RESPIRATORY_TRACT | Status: DC
  Administered 2021-03-31 – 2021-04-01 (×6): 3 mL via RESPIRATORY_TRACT
  Filled 2021-03-31 (×5): qty 3

## 2021-03-31 MED ORDER — FLUTICASONE FUROATE-VILANTEROL 100-25 MCG/INH IN AEPB
1.0000 | INHALATION_SPRAY | Freq: Every day | RESPIRATORY_TRACT | Status: DC
  Administered 2021-04-01 – 2021-04-02 (×2): 1 via RESPIRATORY_TRACT
  Filled 2021-03-31: qty 28

## 2021-03-31 MED ORDER — KETOROLAC TROMETHAMINE 15 MG/ML IJ SOLN
15.0000 mg | Freq: Four times a day (QID) | INTRAMUSCULAR | Status: DC | PRN
  Administered 2021-03-31 – 2021-04-02 (×4): 15 mg via INTRAVENOUS
  Filled 2021-03-31 (×4): qty 1

## 2021-03-31 MED ORDER — METHYLPREDNISOLONE SODIUM SUCC 125 MG IJ SOLR
60.0000 mg | Freq: Two times a day (BID) | INTRAMUSCULAR | Status: AC
  Administered 2021-03-31 (×2): 60 mg via INTRAVENOUS
  Filled 2021-03-31 (×2): qty 2

## 2021-03-31 MED ORDER — GUAIFENESIN ER 600 MG PO TB12
600.0000 mg | ORAL_TABLET | Freq: Two times a day (BID) | ORAL | Status: DC
  Administered 2021-03-31 – 2021-04-02 (×4): 600 mg via ORAL
  Filled 2021-03-31 (×4): qty 1

## 2021-03-31 MED ORDER — INSULIN ASPART 100 UNIT/ML IJ SOLN
0.0000 [IU] | Freq: Three times a day (TID) | INTRAMUSCULAR | Status: DC
  Administered 2021-03-31 – 2021-04-01 (×2): 3 [IU] via SUBCUTANEOUS
  Administered 2021-04-02: 2 [IU] via SUBCUTANEOUS
  Filled 2021-03-31: qty 0.15

## 2021-03-31 MED ORDER — IPRATROPIUM BROMIDE 0.02 % IN SOLN
0.5000 mg | Freq: Once | RESPIRATORY_TRACT | Status: AC
  Administered 2021-03-31: 0.5 mg via RESPIRATORY_TRACT

## 2021-03-31 MED ORDER — METHYLPREDNISOLONE SODIUM SUCC 125 MG IJ SOLR
125.0000 mg | Freq: Once | INTRAMUSCULAR | Status: AC
  Administered 2021-03-31: 125 mg via INTRAVENOUS
  Filled 2021-03-31: qty 2

## 2021-03-31 MED ORDER — INSULIN ASPART 100 UNIT/ML IJ SOLN
0.0000 [IU] | Freq: Every day | INTRAMUSCULAR | Status: DC
  Filled 2021-03-31: qty 0.05

## 2021-03-31 MED ORDER — ADULT MULTIVITAMIN W/MINERALS CH
1.0000 | ORAL_TABLET | Freq: Every day | ORAL | Status: DC
  Administered 2021-03-31 – 2021-04-02 (×3): 1 via ORAL
  Filled 2021-03-31 (×3): qty 1

## 2021-03-31 MED ORDER — ACETAMINOPHEN 325 MG PO TABS
650.0000 mg | ORAL_TABLET | Freq: Once | ORAL | Status: AC
  Administered 2021-03-31: 650 mg via ORAL
  Filled 2021-03-31: qty 2

## 2021-03-31 MED ORDER — CLONIDINE HCL 0.2 MG PO TABS
0.2000 mg | ORAL_TABLET | Freq: Two times a day (BID) | ORAL | Status: DC
  Administered 2021-03-31 – 2021-04-02 (×5): 0.2 mg via ORAL
  Filled 2021-03-31: qty 2
  Filled 2021-03-31 (×4): qty 1

## 2021-03-31 MED ORDER — PREDNISONE 20 MG PO TABS
40.0000 mg | ORAL_TABLET | Freq: Every day | ORAL | Status: DC
  Filled 2021-03-31: qty 2

## 2021-03-31 NOTE — H&P (Signed)
History and Physical    Kathleen Rodriguez CLE:[id number] DOB: 1966/05/02 DOA: 03/30/2021  PCP: [name], MD  Patient coming from: Home  Chief Complaint: shortness of breath  HPI: Kathleen Rodriguez is a 55 y.o. female with medical history significant of asthma, DM2, HTN. Presenting with dyspnea. She reports that 3 days ago she noticed shortness of breath while cleaning up her house and going to the grocery store. She tried increasing her inhalers, but it didn't help. She had non-productive cough and pain associated with cough after a couple of days. She called her PCP for a steroid pack. It did not help. Her symptoms worsened last night, so she decided to come to the ED.    ED Course: CXR was negative. She was given steroids, nebs. TRH was called for admission.   Review of Systems:  Denies CP, lightheadedness, dizziness, N/V/D, fevers, sick contact.  Review of systems is otherwise negative for all not mentioned in HPI.   PMHx Past Medical History:  Diagnosis Date  . Arthritis    "knees, lower back; legs, ankles" (01/27/2016)  . Asthma    followed by Dr. Halford Chessman  . Asthma   . CHF (congestive heart failure) ([place]) 2016   "when I went into a coma"  . Cocaine abuse ([place])   . 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 Diabetes mellitus without complication ([place])   . Dyspnea   . GERD (gastroesophageal reflux disease)   . Hypertension    "doctor took me off RX in 2016" (01/27/2016)  . Hypertension   . Influenza B April 9449   Complicated by multi-organ failure  . OSA on CPAP "since " 03/20/2013  . Pneumonia 2016  . Required emergent intubation    asthma exacerbation in 2016  . Sleep apnea   . Tobacco abuse   . Upper airway cough syndrome     PSHx Past Surgical History:  Procedure Laterality Date  . Walker   as a teenager , cyst was benign  . CESAREAN SECTION  2006  . LACERATION REPAIR Right ~ 1997   "tried to cut myself"  . TOTAL KNEE ARTHROPLASTY Left 09/01/2016    Procedure: LEFT TOTAL KNEE ARTHROPLASTY;  Surgeon: Leandrew Koyanagi, MD;  Location: [facility];  Service: Orthopedics;  Laterality: Left;  . TUBAL LIGATION  2006    SocHx  reports that she quit smoking about 8 years ago. Her smoking use included cigarettes. She started smoking about 29 years ago. She has a 5.25 pack-year smoking history. She has never used smokeless tobacco. She reports previous drug use. Drugs: Cocaine and "Crack" cocaine. No history on file for alcohol use.  Allergies  Allergen Reactions  . Tomato Hives, Itching and Other (See Comments)    ALSO REACTS TO KETCHUP  . Latex Itching and Rash  . Latex Hives and Rash  . Tomato Hives and Rash  . Wool Alcohol [Lanolin] Itching    FamHx Family History  Problem Relation Age of Onset  . Asthma Mother   . Heart murmur Mother   . Cancer Maternal Grandmother   . Heart disease Maternal Grandmother   . Hypertension Maternal Grandmother   . Cancer Paternal Grandmother   . Hypertension Mother   . HIV/AIDS Father     Prior to Admission medications   Medication Sig Start Date End Date Taking? Authorizing Provider  albuterol (VENTOLIN HFA) 108 (90 Base) MCG/ACT inhaler Inhale 1 puff into the lungs every 4 (four) hours as needed  for wheezing or shortness of breath.   Yes [provider]  acetaminophen (TYLENOL) 500 MG tablet Take 500 mg by mouth every 6 (six) hours as needed for mild pain.    [provider]  albuterol (PROVENTIL) (2.5 MG/3ML) 0.083% nebulizer solution Take 3 mLs (2.5 mg total) by nebulization every 6 (six) hours as needed for wheezing or shortness of breath. 10/13/20   Chesley Mires, MD  albuterol (PROVENTIL) (2.5 MG/3ML) 0.083% nebulizer solution Take 2.5 mg by nebulization every 6 (six) hours as needed for shortness of breath or wheezing. Patient not taking: No sig reported 10/18/20   [provider]  aspirin 81 MG EC tablet Take 1 tablet (81 mg total) by mouth daily. 02/07/19   [name],  MD  aspirin EC 81 MG tablet Take 81 mg by mouth daily. Swallow whole. Patient not taking: No sig reported    [provider]  Benralizumab (FASENRA PEN) 30 MG/ML SOAJ Inject 1 pen into the skin every 4 weeks for 3 doses. Deliver to clinic: [facility], [place], [place] [id number] 03/05/21   Chesley Mires, MD  Blood Glucose Monitoring Suppl (ACCU-CHEK GUIDE ME) w/Device KIT 1 each by Does not apply route daily. Dx; Prediabetets; Prednisone use 10/30/19   Newlin, Charlane Ferretti, MD  budesonide (PULMICORT) 0.25 MG/2ML nebulizer solution TAKE 1 VIAL (0.25 MG TOTAL) BY NEBULIZATION 2 (TWO) TIMES DAILY. 01/21/21 01/21/22  Barb Merino, MD  cephALEXin (KEFLEX) 500 MG capsule Take 1 capsule (500 mg total) by mouth 3 (three) times daily. Patient not taking: No sig reported 12/24/20   Bronson Ing, DPM  cloNIDine (CATAPRES) 0.1 MG tablet Take 1 tablet (0.1 mg total) by mouth 2 (two) times daily. Must keep office visit tomorrow for additional refills. Patient taking differently: Take 0.2 mg by mouth 2 (two) times daily. Must keep office visit tomorrow for additional refills. 10/29/19   [name], MD  diclofenac Sodium (VOLTAREN) 1 % GEL Apply 2 g topically 4 (four) times daily. Patient taking differently: Apply 2 g topically 4 (four) times daily. Using as needed 12/02/20   [name], MD  EPINEPHrine 0.3 mg/0.3 mL IJ SOAJ injection Inject 0.3 mg into the muscle as needed for anaphylaxis. 11/16/20   Parrett, Fonnie Mu, NP  fluconazole (DIFLUCAN) 100 MG tablet TAKE 1 TABLET (100 MG TOTAL) BY MOUTH ONCE FOR 1 DOSE. Patient not taking: No sig reported 01/21/21 01/21/22  Barb Merino, MD  fluticasone (FLONASE) 50 MCG/ACT nasal spray Place 1 spray into both nostrils daily as needed for allergies. 01/04/21   Chesley Mires, MD  fluticasone furoate-vilanterol (BREO ELLIPTA) 200-25 MCG/INH AEPB Inhale 1 puff into the lungs daily. 10/29/20   Parrett, Fonnie Mu, NP  gabapentin (NEURONTIN) 300 MG capsule Take 2  capsules (600 mg total) by mouth 3 (three) times daily. 12/11/20   [name], MD  gabapentin (NEURONTIN) 600 MG tablet Take 600 mg by mouth 3 (three) times daily. Patient not taking: No sig reported    [provider]  glucose blood (ACCU-CHEK GUIDE) test strip Use as instructed daily before breakfast. Dx; Prediabetets; Prednisone use 10/30/19   [name], MD  ipratropium (ATROVENT) 0.02 % nebulizer solution Take 2.5 mLs (0.5 mg total) by nebulization 2 (two) times daily as needed for wheezing or shortness of breath. 10/11/19   Chesley Mires, MD  loratadine (CLARITIN) 10 MG tablet Take 1 tablet (10 mg total) by mouth daily. 09/09/19   Chesley Mires, MD  loratadine (CLARITIN) 10 MG  tablet Take 10 mg by mouth daily. Patient not taking: No sig reported    [provider]  meloxicam (MOBIC) 7.5 MG tablet Take 1 tablet (7.5 mg total) by mouth daily. 04/23/19   [name], MD  Menthol (HALLS COUGH DROPS MT) Use as directed 1 drop in the mouth or throat 2 (two) times daily as needed (sore throat and cough). Patient not taking: No sig reported    [provider]  metFORMIN (GLUCOPHAGE) 500 MG tablet TAKE 1 TABLET BY MOUTH 2 TIMES DAILY WITH A MEAL. Patient not taking: Reported on 03/30/2021 01/22/21   [name], MD  metFORMIN (GLUCOPHAGE) 500 MG tablet Take 500 mg by mouth 2 (two) times daily. Patient not taking: No sig reported 01/09/21   [provider]  methocarbamol (ROBAXIN) 500 MG tablet Take 1 tablet (500 mg total) by mouth every 8 (eight) hours as needed for muscle spasms. Patient not taking: Reported on 03/01/2021 12/02/20   [name], MD  Misc. Devices MISC Blood pressure monitor  Dx: Hypertension 02/07/19   [name], MD  Misc. Devices MISC Depends . Wt- 250 lbs, size 1X; Poise pads 03/23/21   Newlin, [name], MD  montelukast (SINGULAIR) 10 MG tablet Take 1 tablet (10 mg total) by mouth at bedtime. 04/30/19   Martyn Ehrich, NP   montelukast (SINGULAIR) 10 MG tablet TAKE 1 TABLET (10 MG TOTAL) BY MOUTH AT BEDTIME. Patient not taking: Reported on 03/01/2021 01/21/21 01/21/22  Barb Merino, MD  omeprazole (PRILOSEC) 20 MG capsule Take 20 mg by mouth daily.    [provider]  pantoprazole (PROTONIX) 40 MG tablet Take 1 tablet (40 mg total) by mouth daily at 12 noon. Patient not taking: Reported on 03/01/2021 07/06/20   Donne Hazel, MD  predniSONE (DELTASONE) 10 MG tablet Take 4 tabs x 2 days, 3 tabs x 2 days, 2 tabs x 2 days, 1 tab x 2 days then stop 03/21/21   Hayden Rasmussen, MD  PROAIR HFA 108 [phone] Base) MCG/ACT inhaler TAKE 2 PUFFS BY MOUTH EVERY 6 HOURS AS NEEDED FOR WHEEZE OR SHORTNESS OF BREATH 11/04/20   Chesley Mires, MD  urea (CARMOL) 20 % cream Apply 1 application topically daily as needed (For dry feet). 10/15/19   [name], MD    Physical Exam: Vitals:   03/31/21 0400 03/31/21 0415 03/31/21 0445 03/31/21 0530  BP: 132/68 129/75 139/81 (!) 148/72  Pulse: (!) 112 (!) 114 (!) 111 (!) 118  Resp: (!) 24 (!) 21 (!) 23 (!) 24  Temp:      TempSrc:      SpO2: 97% 92% 91% 94%    General: 55 y.o. female resting in bed in NAD Eyes: PERRL, normal sclera ENMT: Nares patent w/o discharge, orophaynx clear, dentition normal, ears w/o discharge/lesions/ulcers Neck: Supple, trachea midline Cardiovascular: tachy, +S1, S2, no m/g/r, equal pulses throughout Respiratory: diffuse exp wheeze, no r/r, slightly increased WOB on 2L Kathleen Rodriguez GI: BS+, NDNT, no masses noted, no organomegaly noted MSK: No e/c/c Skin: No rashes, bruises, ulcerations noted Neuro: A&O x 3, no focal deficits Psyc: Appropriate interaction and affect, calm/cooperative  Labs on Admission: I have personally reviewed following labs and imaging studies  CBC: Recent Labs  Lab 03/31/21 0448  WBC 8.0  NEUTROABS 7.3  HGB 13.5  HCT 43.2  MCV 90.2  PLT 828   Basic Metabolic Panel: Recent Labs  Lab 03/31/21 0448  NA 138  K 3.7  CL 105   CO2  22  GLUCOSE 222*  BUN 17  CREATININE 1.03*  CALCIUM 8.7*   GFR: Estimated Creatinine Clearance: 78.8 mL/min (A) (by C-G formula based on SCr of 1.03 mg/dL (H)). Liver Function Tests: No results for input(s): AST, ALT, ALKPHOS, BILITOT, PROT, ALBUMIN in the last 168 hours. No results for input(s): LIPASE, AMYLASE in the last 168 hours. No results for input(s): AMMONIA in the last 168 hours. Coagulation Profile: No results for input(s): INR, PROTIME in the last 168 hours. Cardiac Enzymes: No results for input(s): CKTOTAL, CKMB, CKMBINDEX, TROPONINI in the last 168 hours. BNP (last 3 results) No results for input(s): PROBNP in the last 8760 hours. HbA1C: No results for input(s): HGBA1C in the last 72 hours. CBG: No results for input(s): GLUCAP in the last 168 hours. Lipid Profile: No results for input(s): CHOL, HDL, LDLCALC, TRIG, CHOLHDL, LDLDIRECT in the last 72 hours. Thyroid Function Tests: No results for input(s): TSH, T4TOTAL, FREET4, T3FREE, THYROIDAB in the last 72 hours. Anemia Panel: No results for input(s): VITAMINB12, FOLATE, FERRITIN, TIBC, IRON, RETICCTPCT in the last 72 hours. Urine analysis:    Component Value Date/Time   COLORURINE YELLOW 08/07/2018 1752   APPEARANCEUR CLEAR 08/07/2018 1752   LABSPEC 1.033 (H) 08/07/2018 1752   PHURINE 5.0 08/07/2018 1752   GLUCOSEU NEGATIVE 08/07/2018 1752   HGBUR NEGATIVE 08/07/2018 1752   BILIRUBINUR NEGATIVE 08/07/2018 1752   BILIRUBINUR small 05/26/2017 1539   KETONESUR NEGATIVE 08/07/2018 1752   PROTEINUR NEGATIVE 08/07/2018 1752   UROBILINOGEN 1.0 05/26/2017 1539   UROBILINOGEN 1.0 03/22/2014 0805   NITRITE NEGATIVE 08/07/2018 1752   LEUKOCYTESUR NEGATIVE 08/07/2018 1752    Radiological Exams on Admission: DG Chest Port 1 View  Result Date: 03/31/2021 CLINICAL DATA:  Shortness of breath. Cough and congestion with history of asthma EXAM: PORTABLE CHEST 1 VIEW COMPARISON:  03/21/2021 FINDINGS: Artifact from  EKG leads. Normal heart size and mediastinal contours. No acute infiltrate or edema. No effusion or pneumothorax. No acute osseous findings. IMPRESSION: No active disease. Electronically Signed   By: Monte Fantasia M.D.   On: 03/31/2021 04:44    EKG: Independently reviewed. Sinus tach, no st elevation  Assessment/Plan Asthma exacerbation     - admitted to inpt SDU, will change to progressive as respiratory status has improved     - still has pretty decent expiratory wheeze; will continue nebs, inhalers, steroids, O2 support  DM2     - DM diet, SSI, glucose checks, check A1c  OSA     - CPAP at night  HTN     - continue clonidine  DVT prophylaxis: lovenox  Code Status: FULL  Family Communication: None at bedside.  Consults called: none  Status is: Inpatient  Remains inpatient appropriate because:Inpatient level of care appropriate due to severity of illness   Dispo: The patient is from: Home              Anticipated d/c is to: Home              Patient currently is not medically stable to d/c.   Difficult to place patient No  Time spent coordinating admission: 70 minutes  [facility]  If 7PM-7AM, please contact night-coverage [email or web address]  03/31/2021, 7:14 AM

## 2021-03-31 NOTE — ED Provider Notes (Signed)
[facility] DEPT Provider Note: Georgena Spurling, MD, FACEP  CSN: [id number] MRN: [id number] ARRIVAL: 03/30/21 at [facility]: [facility]  03/31/21 3:59 AM (but seen on arrival in the hallway and then in her room with Tab[facility] of respiratory therapy) Kathleen Rodriguez is a 55 y.o. female with history of asthma.  She has been short of breath for 3 days.  Her symptoms steadily worsened despite being prescribed prednisone.  Her symptoms became severe and she called EMS.  They administered a DuoNeb and IV magnesium sulfate in route.  She continued to have expiratory wheezing and I ordered a continuous albuterol/ipratropium neb treatment soon after arrival.  Despite this she continues to be short of breath with inspiratory and expiratory wheezes.  She states her symptoms were severe enough at home that she could not even walk around the house to perform activities of daily living.  She has not had a fever or other symptoms of acute illness.  She is having pain in her chest when she coughs or takes a deep breath.  She denies any nausea, vomiting or diarrhea.   Past Medical History:  Diagnosis Date  . Arthritis    "knees, lower back; legs, ankles" (01/27/2016)  . Asthma    followed by Dr. Halford Chessman  . Asthma   . CHF (congestive heart failure) ([place]) 2016   "when I went into a coma"  . Cocaine abuse ([place])   . 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 Diabetes mellitus without complication ([place])   . Dyspnea   . GERD (gastroesophageal reflux disease)   . Hypertension    "doctor took me off RX in 2016" (01/27/2016)  . Hypertension   . Influenza B April 3790   Complicated by multi-organ failure  . OSA on CPAP "since " 03/20/2013  . Pneumonia 2016  . Required emergent intubation    asthma exacerbation in 2016  . Sleep apnea   . Tobacco abuse   . Upper airway cough syndrome     Past Surgical History:  Procedure Laterality Date  . [place]   as a teenager , cyst was benign  . CESAREAN SECTION  2006  . LACERATION REPAIR Right ~ 1997   "tried to cut myself"  . TOTAL KNEE ARTHROPLASTY Left 09/01/2016   Procedure: LEFT TOTAL KNEE ARTHROPLASTY;  Surgeon: Leandrew Koyanagi, MD;  Location: [facility];  Service: Orthopedics;  Laterality: Left;  . TUBAL LIGATION  2006    Family History  Problem Relation Age of Onset  . Asthma Mother   . Heart murmur Mother   . Cancer Maternal Grandmother   . Heart disease Maternal Grandmother   . Hypertension Maternal Grandmother   . Cancer Paternal Grandmother   . Hypertension Mother   . HIV/AIDS Father     Social History   Tobacco Use  . Smoking status: Former Smoker    Packs/day: 0.25    Years: 21.00    Pack years: 5.25    Types: Cigarettes    Start date: 45    Quit date: 01/31/2013    Years since quitting: 8.1  . Smokeless tobacco: Never Used  Vaping Use  . Vaping Use: Never used  Substance Use Topics  . Drug use: Not Currently    Types: Cocaine, "Crack" cocaine    Comment: 01/27/2016 "clean from smoking crack since 2007"    Prior to Admission medications  Medication Sig Start Date End Date Taking? Authorizing Provider  albuterol (VENTOLIN HFA) 108 (90 Base) MCG/ACT inhaler Inhale 1 puff into the lungs every 4 (four) hours as needed for wheezing or shortness of breath.   Yes [provider]  acetaminophen (TYLENOL) 500 MG tablet Take 500 mg by mouth every 6 (six) hours as needed for mild pain.    [provider]  albuterol (PROVENTIL) (2.5 MG/3ML) 0.083% nebulizer solution Take 3 mLs (2.5 mg total) by nebulization every 6 (six) hours as needed for wheezing or shortness of breath. 10/13/20   Chesley Mires, MD  albuterol (PROVENTIL) (2.5 MG/3ML) 0.083% nebulizer solution Take 2.5 mg by nebulization every 6 (six) hours as needed for shortness of breath or wheezing. Patient not taking: No sig reported 10/18/20   [provider]  aspirin 81 MG EC  tablet Take 1 tablet (81 mg total) by mouth daily. 02/07/19   [name], MD  aspirin EC 81 MG tablet Take 81 mg by mouth daily. Swallow whole. Patient not taking: No sig reported    [provider]  Benralizumab (FASENRA PEN) 30 MG/ML SOAJ Inject 1 pen into the skin every 4 weeks for 3 doses. Deliver to clinic: [facility], [place], [place] [address] 03/05/21   Chesley Mires, MD  Blood Glucose Monitoring Suppl (ACCU-CHEK GUIDE ME) w/Device KIT 1 each by Does not apply route daily. Dx; Prediabetets; Prednisone use 10/30/19   Newlin, Charlane Ferretti, MD  budesonide (PULMICORT) 0.25 MG/2ML nebulizer solution TAKE 1 VIAL (0.25 MG TOTAL) BY NEBULIZATION 2 (TWO) TIMES DAILY. 01/21/21 01/21/22  Barb Merino, MD  cephALEXin (KEFLEX) 500 MG capsule Take 1 capsule (500 mg total) by mouth 3 (three) times daily. Patient not taking: No sig reported 12/24/20   Bronson Ing, DPM  cloNIDine (CATAPRES) 0.1 MG tablet Take 1 tablet (0.1 mg total) by mouth 2 (two) times daily. Must keep office visit tomorrow for additional refills. Patient taking differently: Take 0.2 mg by mouth 2 (two) times daily. Must keep office visit tomorrow for additional refills. 10/29/19   [name], MD  diclofenac Sodium (VOLTAREN) 1 % GEL Apply 2 g topically 4 (four) times daily. Patient taking differently: Apply 2 g topically 4 (four) times daily. Using as needed 12/02/20   [name], MD  EPINEPHrine 0.3 mg/0.3 mL IJ SOAJ injection Inject 0.3 mg into the muscle as needed for anaphylaxis. 11/16/20   Parrett, Fonnie Mu, NP  fluconazole (DIFLUCAN) 100 MG tablet TAKE 1 TABLET (100 MG TOTAL) BY MOUTH ONCE FOR 1 DOSE. Patient not taking: No sig reported 01/21/21 01/21/22  Barb Merino, MD  fluticasone (FLONASE) 50 MCG/ACT nasal spray Place 1 spray into both nostrils daily as needed for allergies. 01/04/21   Chesley Mires, MD  fluticasone furoate-vilanterol (BREO ELLIPTA) 200-25 MCG/INH AEPB Inhale 1 puff into the lungs daily.  10/29/20   Parrett, Fonnie Mu, NP  gabapentin (NEURONTIN) 300 MG capsule Take 2 capsules (600 mg total) by mouth 3 (three) times daily. 12/11/20   [name], MD  gabapentin (NEURONTIN) 600 MG tablet Take 600 mg by mouth 3 (three) times daily. Patient not taking: No sig reported    [provider]  glucose blood (ACCU-CHEK GUIDE) test strip Use as instructed daily before breakfast. Dx; Prediabetets; Prednisone use 10/30/19   [name], MD  ipratropium (ATROVENT) 0.02 % nebulizer solution Take 2.5 mLs (0.5 mg total) by nebulization 2 (two) times daily as needed for wheezing or shortness of breath. 10/11/19  Chesley Mires, MD  loratadine (CLARITIN) 10 MG tablet Take 1 tablet (10 mg total) by mouth daily. 09/09/19   Chesley Mires, MD  loratadine (CLARITIN) 10 MG tablet Take 10 mg by mouth daily. Patient not taking: No sig reported    [provider]  meloxicam (MOBIC) 7.5 MG tablet Take 1 tablet (7.5 mg total) by mouth daily. 04/23/19   [name], MD  Menthol (HALLS COUGH DROPS MT) Use as directed 1 drop in the mouth or throat 2 (two) times daily as needed (sore throat and cough). Patient not taking: No sig reported    [provider]  metFORMIN (GLUCOPHAGE) 500 MG tablet TAKE 1 TABLET BY MOUTH 2 TIMES DAILY WITH A MEAL. Patient not taking: Reported on 03/30/2021 01/22/21   [name], MD  metFORMIN (GLUCOPHAGE) 500 MG tablet Take 500 mg by mouth 2 (two) times daily. Patient not taking: No sig reported 01/09/21   [provider]  methocarbamol (ROBAXIN) 500 MG tablet Take 1 tablet (500 mg total) by mouth every 8 (eight) hours as needed for muscle spasms. Patient not taking: Reported on 03/01/2021 12/02/20   [name], MD  Misc. Devices MISC Blood pressure monitor  Dx: Hypertension 02/07/19   [name], MD  Misc. Devices MISC Depends . Wt- 250 lbs, size 1X; Poise pads 03/23/21   Newlin, [name], MD  montelukast (SINGULAIR) 10 MG tablet Take 1 tablet  (10 mg total) by mouth at bedtime. 04/30/19   Martyn Ehrich, NP  montelukast (SINGULAIR) 10 MG tablet TAKE 1 TABLET (10 MG TOTAL) BY MOUTH AT BEDTIME. Patient not taking: Reported on 03/01/2021 01/21/21 01/21/22  Barb Merino, MD  omeprazole (PRILOSEC) 20 MG capsule Take 20 mg by mouth daily.    [provider]  pantoprazole (PROTONIX) 40 MG tablet Take 1 tablet (40 mg total) by mouth daily at 12 noon. Patient not taking: Reported on 03/01/2021 07/06/20   Donne [facility], MD  predniSONE (DELTASONE) 10 MG tablet Take 4 tabs x 2 days, 3 tabs x 2 days, 2 tabs x 2 days, 1 tab x 2 days then stop 03/21/21   Hayden Rasmussen, MD  PROAIR HFA 108 [phone] Base) MCG/ACT inhaler TAKE 2 PUFFS BY MOUTH EVERY 6 HOURS AS NEEDED FOR WHEEZE OR SHORTNESS OF BREATH 11/04/20   Chesley Mires, MD  urea (CARMOL) 20 % cream Apply 1 application topically daily as needed (For dry feet). 10/15/19   [name], MD    Allergies Tomato, Latex, Latex, Tomato, and Wool alcohol [lanolin]   REVIEW OF SYSTEMS  Negative except as noted here or in the History of Present Illness.   PHYSICAL EXAMINATION  Initial Vital Signs Blood pressure (!) 152/69, pulse (!) 111, temperature 98.2 F (36.8 C), temperature source Oral, resp. rate (!) 23, SpO2 97 %.  Examination (at time of arrival) General: Well-developed, well-nourished female in mild distress; appearance consistent with age of record HENT: normocephalic; atraumatic Eyes: pupils equal, round and reactive to light; extraocular muscles intact Neck: supple Heart: regular rate and rhythm; tachycardia Lungs: Expiratory wheezes Abdomen: soft; nondistended; nontender; bowel sounds present Extremities: No deformity; full range of motion; pulses normal Neurologic: Awake, alert and oriented; motor function intact in all extremities and symmetric; no facial droop Skin: Warm and dry Psychiatric: Anxious   RESULTS  Summary of this visit's results, reviewed and interpreted  by myself:   EKG Interpretation  Date/Time:  Wednesday March 31 2021 04:39:54 EDT Ventricular Rate:  114 PR Interval:  QRS Duration: 115 QT Interval:  372 QTC Calculation: 513 R Axis:   85 Text Interpretation: Atrial flutter with varied AV block, Nonspecific intraventricular conduction delay No significant change was found Confirmed by Shanon Rosser [phone]) on 03/31/2021 4:48:47 AM      Laboratory Studies: Results for orders placed or performed during the hospital encounter of 03/30/21 (from the past 24 hour(s))  CBC with Differential/Platelet     Status: Abnormal (Preliminary result)   Collection Time: 03/31/21  4:48 AM  Result Value Ref Range   WBC 8.0 4.0 - 10.5 K/uL   RBC 4.79 3.87 - 5.11 MIL/uL   Hemoglobin 13.5 12.0 - 15.0 g/dL   HCT 43.2 36.0 - 46.0 %   MCV 90.2 80.0 - 100.0 fL   MCH 28.2 26.0 - 34.0 pg   MCHC 31.3 30.0 - 36.0 g/dL   RDW 16.0 (H) 11.5 - 15.5 %   Platelets 281 150 - 400 K/uL   nRBC 0.0 0.0 - 0.2 %   Neutrophils Relative % PENDING %   Neutro Abs PENDING 1.7 - 7.7 K/uL   Band Neutrophils PENDING %   Lymphocytes Relative PENDING %   Lymphs Abs PENDING 0.7 - 4.0 K/uL   Monocytes Relative PENDING %   Monocytes Absolute PENDING 0.1 - 1.0 K/uL   Eosinophils Relative PENDING %   Eosinophils Absolute PENDING 0.0 - 0.5 K/uL   Basophils Relative PENDING %   Basophils Absolute PENDING 0.0 - 0.1 K/uL   WBC Morphology PENDING    RBC Morphology PENDING    Smear Review PENDING    Other PENDING %   nRBC PENDING 0 /100 WBC   Metamyelocytes Relative PENDING %   Myelocytes PENDING %   Promyelocytes Relative PENDING %   Blasts PENDING %   Immature Granulocytes PENDING %   Abs Immature Granulocytes PENDING 0.00 - 0.07 K/uL  Basic metabolic panel     Status: Abnormal   Collection Time: 03/31/21  4:48 AM  Result Value Ref Range   Sodium 138 135 - 145 mmol/L   Potassium 3.7 3.5 - 5.1 mmol/L   Chloride 105 98 - 111 mmol/L   CO2 22 22 - 32 mmol/L   Glucose, Bld  222 (H) 70 - 99 mg/dL   BUN 17 6 - 20 mg/dL   Creatinine, Ser 1.03 (H) 0.44 - 1.00 mg/dL   Calcium 8.7 (L) 8.9 - 10.3 mg/dL   GFR, Estimated >60 >60 mL/min   Anion gap 11 5 - 15  Resp Panel by RT-PCR (Flu A&B, Covid) Nasopharyngeal Swab     Status: None   Collection Time: 03/31/21  4:48 AM   Specimen: Nasopharyngeal Swab; Nasopharyngeal(NP) swabs in vial transport medium  Result Value Ref Range   SARS Coronavirus 2 by RT PCR NEGATIVE NEGATIVE   Influenza A by PCR NEGATIVE NEGATIVE   Influenza B by PCR NEGATIVE NEGATIVE  Blood gas, venous (at [facility] and [facility], not at [facility])     Status: Abnormal   Collection Time: 03/31/21  4:58 AM  Result Value Ref Range   pH, Ven 7.270 7.250 - 7.430   pCO2, Ven 50.5 44.0 - 60.0 mmHg   pO2, Ven 52.0 (H) 32.0 - 45.0 mmHg   Bicarbonate 22.4 20.0 - 28.0 mmol/L   Acid-base deficit 4.4 (H) 0.0 - 2.0 mmol/L   O2 Saturation 79.6 %   Patient temperature 98.6    Imaging Studies: DG Chest Port 1 View  Result Date: 03/31/2021 CLINICAL DATA:  Shortness of  breath. Cough and congestion with history of asthma EXAM: PORTABLE CHEST 1 VIEW COMPARISON:  03/21/2021 FINDINGS: Artifact from EKG leads. Normal heart size and mediastinal contours. No acute infiltrate or edema. No effusion or pneumothorax. No acute osseous findings. IMPRESSION: No active disease. Electronically Signed   By: Monte Fantasia M.D.   On: 03/31/2021 04:44    ED COURSE and MDM  Nursing notes, initial and subsequent vitals signs, including pulse oximetry, reviewed and interpreted by myself.  Vitals:   03/31/21 0400 03/31/21 0415 03/31/21 0445 03/31/21 0530  BP: 132/68 129/75 139/81 (!) 148/72  Pulse: (!) 112 (!) 114 (!) 111 (!) 118  Resp: (!) 24 (!) 21 (!) 23 (!) 24  Temp:      TempSrc:      SpO2: 97% 92% 91% 94%   Medications  albuterol (PROVENTIL,VENTOLIN) solution continuous neb (0 mg/hr Nebulization Stopped 03/31/21 0107)  ipratropium (ATROVENT) nebulizer solution 0.5 mg (0.5 mg Nebulization Not  Given 03/31/21 0009)  albuterol (VENTOLIN) (5 MG/ML) 0.5% continuous inhalation solution (  Not Given 03/31/21 0008)  albuterol (PROVENTIL,VENTOLIN) solution continuous neb (10 mg/hr Nebulization New Bag/Given 03/31/21 0433)  ipratropium (ATROVENT) 0.02 % nebulizer solution (0.5 mg  Given 03/31/21 0004)  methylPREDNISolone sodium succinate (SOLU-MEDROL) 125 mg/2 mL injection 125 mg (125 mg Intravenous Given 03/31/21 0446)  ipratropium (ATROVENT) nebulizer solution 0.5 mg (0.5 mg Nebulization Given 03/31/21 0434)   6:00 AM Patient still wheezing after additional continuous albuterol/ipratropium treatment.  Will have patient admitted.  6:33 AM Dr. Myna Hidalgo to admit.   PROCEDURES  Procedures CRITICAL CARE Performed by: Karen Chafe [place] Total critical care time: 30 minutes Critical care time was exclusive of separately billable procedures and treating other patients. Critical care was necessary to treat or prevent imminent or life-threatening deterioration. Critical care was time spent personally by me on the following activities: development of treatment plan with patient and/or surrogate as well as nursing, discussions with consultants, evaluation of patient's response to treatment, examination of patient, obtaining history from patient or surrogate, ordering and performing treatments and interventions, ordering and review of laboratory studies, ordering and review of radiographic studies, pulse oximetry and re-evaluation of patient's condition.   ED DIAGNOSES     ICD-10-CM   1. Severe persistent asthma with acute exacerbation  J45.51   2. Shortness of breath  R06.02        Shanon Rosser, MD 03/31/21 [phone]

## 2021-04-01 ENCOUNTER — Other Ambulatory Visit: Payer: Self-pay

## 2021-04-01 DIAGNOSIS — J4551 Severe persistent asthma with (acute) exacerbation: Secondary | ICD-10-CM | POA: Diagnosis not present

## 2021-04-01 LAB — COMPREHENSIVE METABOLIC PANEL
ALT: 15 U/L (ref 0–44)
AST: 14 U/L — ABNORMAL LOW (ref 15–41)
Albumin: 3.5 g/dL (ref 3.5–5.0)
Alkaline Phosphatase: 51 U/L (ref 38–126)
Anion gap: 5 (ref 5–15)
BUN: 21 mg/dL — ABNORMAL HIGH (ref 6–20)
CO2: 24 mmol/L (ref 22–32)
Calcium: 8.9 mg/dL (ref 8.9–10.3)
Chloride: 106 mmol/L (ref 98–111)
Creatinine, Ser: 0.79 mg/dL (ref 0.44–1.00)
GFR, Estimated: >60 mL/min (ref >60)
Glucose, Bld: 134 mg/dL — ABNORMAL HIGH (ref 70–99)
Potassium: 4.9 mmol/L (ref 3.5–5.1)
Sodium: 135 mmol/L (ref 135–145)
Total Bilirubin: 0.3 mg/dL (ref 0.3–1.2)
Total Protein: 6.9 g/dL (ref 6.5–8.1)

## 2021-04-01 LAB — CBC
HCT: 42.3 % (ref 36.0–46.0)
Hemoglobin: 13.3 g/dL (ref 12.0–15.0)
MCH: 28.1 pg (ref 26.0–34.0)
MCHC: 31.4 g/dL (ref 30.0–36.0)
MCV: 89.4 fL (ref 80.0–100.0)
Platelets: 260 10*3/uL (ref 150–400)
RBC: 4.73 MIL/uL (ref 3.87–5.11)
RDW: 16.7 % — ABNORMAL HIGH (ref 11.5–15.5)
WBC: 17.3 10*3/uL — ABNORMAL HIGH (ref 4.0–10.5)
nRBC: 0 % (ref 0.0–0.2)

## 2021-04-01 LAB — GLUCOSE, CAPILLARY
Glucose-Capillary: 116 mg/dL — ABNORMAL HIGH (ref 70–99)
Glucose-Capillary: 89 mg/dL (ref 70–99)
Glucose-Capillary: 137 mg/dL — ABNORMAL HIGH (ref 70–99)
Glucose-Capillary: 171 mg/dL — ABNORMAL HIGH (ref 70–99)
Glucose-Capillary: 111 mg/dL — ABNORMAL HIGH (ref 70–99)

## 2021-04-01 LAB — HEMOGLOBIN A1C
Hgb A1c MFr Bld: 5.9 % — ABNORMAL HIGH (ref 4.8–5.6)
Mean Plasma Glucose: 123 mg/dL

## 2021-04-01 MED ORDER — METHYLPREDNISOLONE SODIUM SUCC 125 MG IJ SOLR
60.0000 mg | Freq: Two times a day (BID) | INTRAMUSCULAR | Status: AC
  Administered 2021-04-01 (×2): 60 mg via INTRAVENOUS
  Filled 2021-04-01 (×2): qty 2

## 2021-04-01 MED ORDER — SALINE SPRAY 0.65 % NA SOLN
1.0000 | NASAL | Status: DC | PRN
  Administered 2021-04-01: 1 via NASAL
  Filled 2021-04-01: qty 44

## 2021-04-01 MED ORDER — ALBUTEROL SULFATE (2.5 MG/3ML) 0.083% IN NEBU: 2.5000 mg | INHALATION_SOLUTION | RESPIRATORY_TRACT | Status: DC | PRN

## 2021-04-01 MED ORDER — IPRATROPIUM-ALBUTEROL 0.5-2.5 (3) MG/3ML IN SOLN
3.0000 mL | Freq: Four times a day (QID) | RESPIRATORY_TRACT | Status: DC
  Administered 2021-04-02 (×2): 3 mL via RESPIRATORY_TRACT
  Filled 2021-04-01 (×2): qty 3

## 2021-04-01 MED ORDER — FLUCONAZOLE 150 MG PO TABS
150.0000 mg | ORAL_TABLET | Freq: Once | ORAL | Status: AC
  Administered 2021-04-01: 150 mg via ORAL
  Filled 2021-04-01: qty 1

## 2021-04-01 NOTE — Progress Notes (Signed)
PROGRESS NOTE    Kathleen Rodriguez  FAO:[id number] DOB: 1965/11/29 DOA: 03/30/2021 PCP: Hoy Register, MD   Brief Narrative:  This 55 years old female with PMH significant for asthma, DM 2, HTN presented in the ED with 3-day history of worsening shortness of breath.  Patient reports symptoms started while cleaning up her house and going to the grocery store.  She reported using her regular inhalers without any significant improvement.  She also reports having nonproductive cough associated with chest pain while having coughing episode.  She called her primary care physician and was started on prednisone pack which did not relieve her symptoms.  Chest x-ray no acute infiltrate. Patient is admitted for asthma exacerbation.   Assessment & Plan:   Principal Problem:   Asthma exacerbation Active Problems:   OSA (obstructive sleep apnea)   COPD exacerbation (HCC)   Diabetes mellitus without complication (HCC)   Essential hypertension  Asthma exacerbation: Patient presented with worsening shortness of breath. She was started on prednisone pack by her PCP which did not relieve symptoms. Continue IV Solu-Medrol 60 mg every 12 hours will transition to prednisone 40 mg daily from tomorrow. Continue scheduled and as needed DuoNeb nebulization.  DM2 Continue sliding scale. Continue home insulin regimen.  Obstructive sleep apnea: Continue CPAP at at night.  Hypertension: Continue clonidine.:  Yeast infection: Start Diflucan 150 mg p.o. once  DVT prophylaxis: Lovenox Code Status: Full code Family Communication: No family at bedside Disposition Plan:   Status is: Inpatient  Remains inpatient appropriate because:Inpatient level of care appropriate due to severity of illness  Dispo: The patient is from: Home              Anticipated d/c is to: Home 6/10.              Patient currently is not medically stable to d/c.   Difficult to place patient No   Consultants:   None  Procedures: None Antimicrobials:    Anti-infectives (From admission, onward)    Start     Dose/Rate Route Frequency Ordered Stop   04/01/21 1015  fluconazole (DIFLUCAN) tablet 150 mg        150 mg Oral  Once 04/01/21 6962 04/01/21 1200       Subjective: Patient was seen and examined at bedside.  Overnight events noted.  She still reports having significant tightness.  She also reports having yeast infection whenever she is placed on prednisone or steroid treatment.  Objective: Vitals:   04/01/21 0446 04/01/21 0501 04/01/21 0700 04/01/21 1320  BP:  139/72  132/79  Pulse:  81  73  Resp:  20  20  Temp:  97.8 F (36.6 C)  98 F (36.7 C)  TempSrc:    Oral  SpO2:  93% 93% 99%  Height: 5\' 6"  (1.676 m)       Intake/Output Summary (Last 24 hours) at 04/01/2021 1403 Last data filed at 03/31/2021 1848 Gross per 24 hour  Intake 240 ml  Output --  Net 240 ml   There were no vitals filed for this visit.  Examination:  General exam: Appears calm and comfortable , not in any acute distress. Respiratory system: Wheezing on auscultation. Respiratory effort normal. Cardiovascular system: S1 & S2 heard, RRR. No JVD, murmurs, rubs, gallops or clicks. No pedal edema. Gastrointestinal system: Abdomen is nondistended, soft and nontender. No organomegaly or masses felt. Normal bowel sounds heard. Central nervous system: Alert and oriented. No focal neurological deficits. Extremities: Symmetric 5 x  5 power.  No edema, no cyanosis, no clubbing. Skin: No rashes, lesions or ulcers Psychiatry: Judgement and insight appear normal. Mood & affect appropriate.     Data Reviewed: I have personally reviewed following labs and imaging studies  CBC: Recent Labs  Lab 03/31/21 0448 03/31/21 1232 04/01/21 0518  WBC 8.0 8.5 17.3*  NEUTROABS 7.3  --   --   HGB 13.5 13.1 13.3  HCT 43.2 41.2 42.3  MCV 90.2 88.8 89.4  PLT 281 266 260   Basic Metabolic Panel: Recent Labs  Lab  03/31/21 0448 03/31/21 1232 04/01/21 0518  NA 138  --  135  K 3.7  --  4.9  CL 105  --  106  CO2 22  --  24  GLUCOSE 222*  --  134*  BUN 17  --  21*  CREATININE 1.03* 0.80 0.79  CALCIUM 8.7*  --  8.9   GFR: Estimated Creatinine Clearance: 101.5 mL/min (by C-G formula based on SCr of 0.79 mg/dL). Liver Function Tests: Recent Labs  Lab 04/01/21 0518  AST 14*  ALT 15  ALKPHOS 51  BILITOT 0.3  PROT 6.9  ALBUMIN 3.5   No results for input(s): LIPASE, AMYLASE in the last 168 hours. No results for input(s): AMMONIA in the last 168 hours. Coagulation Profile: No results for input(s): INR, PROTIME in the last 168 hours. Cardiac Enzymes: No results for input(s): CKTOTAL, CKMB, CKMBINDEX, TROPONINI in the last 168 hours. BNP (last 3 results) No results for input(s): PROBNP in the last 8760 hours. HbA1C: Recent Labs    03/31/21 1232  HGBA1C 5.9*   CBG: Recent Labs  Lab 03/31/21 1157 03/31/21 1845 03/31/21 2143 04/01/21 0734 04/01/21 1146  GLUCAP 120* 190* 156* 116* 89   Lipid Profile: No results for input(s): CHOL, HDL, LDLCALC, TRIG, CHOLHDL, LDLDIRECT in the last 72 hours. Thyroid Function Tests: No results for input(s): TSH, T4TOTAL, FREET4, T3FREE, THYROIDAB in the last 72 hours. Anemia Panel: No results for input(s): VITAMINB12, FOLATE, FERRITIN, TIBC, IRON, RETICCTPCT in the last 72 hours. Sepsis Labs: No results for input(s): PROCALCITON, LATICACIDVEN in the last 168 hours.  Recent Results (from the past 240 hour(s))  Resp Panel by RT-PCR (Flu A&B, Covid) Nasopharyngeal Swab     Status: None   Collection Time: 03/31/21  4:48 AM   Specimen: Nasopharyngeal Swab; Nasopharyngeal(NP) swabs in vial transport medium  Result Value Ref Range Status   SARS Coronavirus 2 by RT PCR NEGATIVE NEGATIVE Final    Comment: (NOTE) SARS-CoV-2 target nucleic acids are NOT DETECTED.  The SARS-CoV-2 RNA is generally detectable in upper respiratory specimens during the acute  phase of infection. The lowest concentration of SARS-CoV-2 viral copies this assay can detect is 138 copies/mL. A negative result does not preclude SARS-Cov-2 infection and should not be used as the sole basis for treatment or other patient management decisions. A negative result may occur with  improper specimen collection/handling, submission of specimen other than nasopharyngeal swab, presence of viral mutation(s) within the areas targeted by this assay, and inadequate number of viral copies(<138 copies/mL). A negative result must be combined with clinical observations, patient history, and epidemiological information. The expected result is Negative.  Fact Sheet for Patients:  BloggerCourse.com  Fact Sheet for Healthcare Providers:  SeriousBroker.it  This test is no t yet approved or cleared by the [place] FDA and  has been authorized for detection and/or diagnosis of SARS-CoV-2 by FDA under an Emergency Use Authorization (EUA). This EUA will  remain  in effect (meaning this test can be used) for the duration of the COVID-19 declaration under Section 564(b)(1) of the Act, 21 U.S.C.section 360bbb-3(b)(1), unless the authorization is terminated  or revoked sooner.       Influenza A by PCR NEGATIVE NEGATIVE Final   Influenza B by PCR NEGATIVE NEGATIVE Final    Comment: (NOTE) The Xpert Xpress SARS-CoV-2/FLU/RSV plus assay is intended as an aid in the diagnosis of influenza from Nasopharyngeal swab specimens and should not be used as a sole basis for treatment. Nasal washings and aspirates are unacceptable for Xpert Xpress SARS-CoV-2/FLU/RSV testing.  Fact Sheet for Patients: BloggerCourse.com  Fact Sheet for Healthcare Providers: SeriousBroker.it  This test is not yet approved or cleared by the [place] FDA and has been authorized for detection and/or diagnosis of  SARS-CoV-2 by FDA under an Emergency Use Authorization (EUA). This EUA will remain in effect (meaning this test can be used) for the duration of the COVID-19 declaration under Section 564(b)(1) of the Act, 21 U.S.C. section 360bbb-3(b)(1), unless the authorization is terminated or revoked.  Performed at [facility], [address], [place], [place] [address]     Radiology Studies: [facility] Chest Port 1 View  Result Date: 03/31/2021 CLINICAL DATA:  Shortness of breath. Cough and congestion with history of asthma EXAM: PORTABLE CHEST 1 VIEW COMPARISON:  03/21/2021 FINDINGS: Artifact from EKG leads. Normal heart size and mediastinal contours. No acute infiltrate or edema. No effusion or pneumothorax. No acute osseous findings. IMPRESSION: No active disease. Electronically Signed   By: Marnee Spring M.D.   On: 03/31/2021 04:44     Scheduled Meds:  aspirin EC  81 mg Oral Daily   cloNIDine  0.2 mg Oral BID   enoxaparin (LOVENOX) injection  40 mg Subcutaneous Q24H   fluticasone furoate-vilanterol  1 puff Inhalation Daily   guaiFENesin  600 mg Oral BID   insulin aspart  0-15 Units Subcutaneous TID WC   insulin aspart  0-5 Units Subcutaneous QHS   ipratropium-albuterol  3 mL Nebulization Q4H   methylPREDNISolone (SOLU-MEDROL) injection  60 mg Intravenous Q12H   multivitamin with minerals  1 tablet Oral Daily   Continuous Infusions:   LOS: 1 day    Time spent: 25 mins    Cipriano Bunker, MD [facility]   If 7PM-7AM, please contact night-coverage

## 2021-04-01 NOTE — Patient Outreach (Signed)
Patient currently in hospital, will call again next week to f/u

## 2021-04-01 NOTE — Plan of Care (Signed)
  Problem: Clinical Measurements: Goal: Respiratory complications will improve Outcome: Progressing   Problem: Activity: Goal: Risk for activity intolerance will decrease Outcome: Progressing   Problem: Coping: Goal: Level of anxiety will decrease Outcome: Progressing   

## 2021-04-01 NOTE — Progress Notes (Signed)
I assumed care of patient, I agree with previous RN assessment. I will continue to monitor.   Caprice Red, RN

## 2021-04-02 DIAGNOSIS — J4551 Severe persistent asthma with (acute) exacerbation: Secondary | ICD-10-CM | POA: Diagnosis not present

## 2021-04-02 LAB — GLUCOSE, CAPILLARY
Glucose-Capillary: 127 mg/dL — ABNORMAL HIGH (ref 70–99)
Glucose-Capillary: 102 mg/dL — ABNORMAL HIGH (ref 70–99)

## 2021-04-02 MED ORDER — ALBUTEROL SULFATE (2.5 MG/3ML) 0.083% IN NEBU: 2.5000 mg | INHALATION_SOLUTION | Freq: Four times a day (QID) | RESPIRATORY_TRACT | 12 refills | Status: DC | PRN

## 2021-04-02 MED ORDER — ALBUTEROL SULFATE HFA 108 (90 BASE) MCG/ACT IN AERS: INHALATION_SPRAY | RESPIRATORY_TRACT | 3 refills | Status: DC

## 2021-04-02 MED ORDER — ALBUTEROL SULFATE HFA 108 (90 BASE) MCG/ACT IN AERS: 1.0000 | INHALATION_SPRAY | RESPIRATORY_TRACT | 2 refills | Status: DC | PRN

## 2021-04-02 MED ORDER — BUDESONIDE 0.25 MG/2ML IN SUSP: RESPIRATORY_TRACT | 12 refills | Status: DC

## 2021-04-02 NOTE — TOC Transition Note (Signed)
Transition of Care [facility]) - CM/SW Discharge Note   Patient Details  Name: Kathleen Rodriguez MRN: [id number] Date of Birth: 1966-05-30  [facility] CM/SW Contact:  Lanier Clam, RN Phone Number: 04/02/2021, 12:03 PM   Clinical Narrative:  Received message from nurse for meds asst. Spoke to patient in rm about d/c plans-has safe home,has pcp,has pharmacy-informed that she has health insurance w/an obligated co pay-hospital is unable to provide co pay asst-informed patient to contact her health insurance for asst w/paying for meds-voiced understanding. Nsg to asst w/safe ride transport since patient is ambulatory. No further CM needs.     Final next level of care: Home/Self Care Barriers to Discharge: No Barriers Identified   Patient Goals and CMS Choice Patient states their goals for this hospitalization and ongoing recovery are:: go home CMS Medicare.gov Compare Post Acute Care list provided to:: Patient    Discharge Placement                       Discharge Plan and Services   Discharge Planning Services: CM Consult                                 Social Determinants of Health (SDOH) Interventions     Readmission Risk Interventions No flowsheet data found.

## 2021-04-02 NOTE — Plan of Care (Signed)
  Problem: Clinical Measurements: Goal: Respiratory complications will improve Outcome: Progressing   

## 2021-04-02 NOTE — Progress Notes (Signed)
Patient had SCD's ordered due to her refusal of Lovenox on Day shift, per report. When I attempted to apply SCD's the patient refused the SCD's. She stated, " I would rather have the shot, I cannot go to the bathroom fast enough with those those on my legs." I provided education regarding the need for SCD's or Lovenox, patient understood why and restated the the teaching. Patient would like to re-evaluate the use of Lovenox tomorrow.   Caprice Red, RN

## 2021-04-02 NOTE — Discharge Summary (Signed)
Physician Discharge Summary  Asiah H Casares MRN:[id number] DOB: 11/17/1965 DOA: 03/30/2021  PCP: Newlin, [name], MD  Admit date: 03/30/2021  Discharge date: 04/02/2021  Admitted From: Home.  Disposition: Home.  Recommendations for Outpatient Follow-up:  Follow up with PCP in 1-2 weeks. Please obtain BMP/CBC in one week. Advised to continue inhalers and nebulization as needed. Follow up [facility] as scheduled.  Home Health:None Equipment/Devices:None  Discharge Condition: Stable CODE STATUS:Full code Diet recommendation: Heart Healthy   Brief Summary/ Hospital Course: This 55 years old female with PMH significant for asthma, DM 2, HTN presented in the ED with 3-day history of worsening shortness of breath.  Patient reports symptoms started while cleaning up her house and going to the grocery store.  She reported using her regular inhalers without any significant improvement.  She also reports having nonproductive cough associated with chest pain while having coughing episode.  She called her primary care physician and was started on prednisone pack which did not relieve her symptoms.  Chest x-ray no acute infiltrate. Patient was admitted for asthma exacerbation, she was started on IV Solu-Medrol and scheduled and as needed Albuterol nebulizations.  Patient continues to have wheezing which has significantly improved with IV steroids and nebulization.  Patient has ambulated well without any difficulty breathing.  She feels much stronger and wants to be discharged home.  Patient is being discharged home.   She was managed for below problems.   Discharge Diagnoses:  Principal Problem:   Asthma exacerbation Active Problems:   OSA (obstructive sleep apnea)   COPD exacerbation (HCC)   Diabetes mellitus without complication (HCC)   Essential hypertension  Asthma exacerbation: Patient presented with worsening shortness of breath. She was started on prednisone pack by her PCP  which did not relieve symptoms. Continue IV Solu-Medrol 60 mg every 12 hours will transition to prednisone 40 mg daily from tomorrow. Continue scheduled and as needed DuoNeb nebulization.   DM2 Continue sliding scale. Continue home insulin regimen.   Obstructive sleep apnea: Continue CPAP at at night.   Hypertension: Continue clonidine.:   Yeast infection: Start Diflucan 150 mg p.o. once  Discharge Instructions  Discharge Instructions     Call MD for:  difficulty breathing, headache or visual disturbances   Complete by: As directed    Call MD for:  persistant dizziness or light-headedness   Complete by: As directed    Call MD for:  persistant nausea and vomiting   Complete by: As directed    Diet - low sodium heart healthy   Complete by: As directed    Diet Carb Modified   Complete by: As directed    Discharge instructions   Complete by: As directed    Advised to follow-up with primary care physician in 1 week. Advised to continue inhalers as advised.   Increase activity slowly   Complete by: As directed       Allergies as of 04/02/2021       Reactions   Tomato Hives, Itching, Other (See Comments)   ALSO REACTS TO KETCHUP   Latex Itching, Rash   Latex Hives, Rash   Tomato Hives, Rash   Wool Alcohol [lanolin] Itching        Medication List     STOP taking these medications    cephALEXin 500 MG capsule Commonly known as: KEFLEX   diclofenac Sodium 1 % Gel Commonly known as: Voltaren   Fasenra Pen 30 MG/ML Soaj Generic drug: Benralizumab   fluconazole 100 MG tablet   Commonly known as: DIFLUCAN   fluconazole 150 MG tablet Commonly known as: DIFLUCAN   gabapentin 300 MG capsule Commonly known as: NEURONTIN   methocarbamol 500 MG tablet Commonly known as: ROBAXIN   montelukast 10 MG tablet Commonly known as: SINGULAIR   predniSONE 10 MG tablet Commonly known as: DELTASONE   urea 20 % cream Commonly known as: CARMOL       TAKE these  medications    Accu-Chek Guide Me w/Device Kit 1 each by Does not apply route daily. Dx; Prediabetets; Prednisone use   Accu-Chek Guide test strip Generic drug: glucose blood Use as instructed daily before breakfast. Dx; Prediabetets; Prednisone use   acetaminophen 500 MG tablet Commonly known as: TYLENOL Take 500 mg by mouth every 6 (six) hours as needed for mild pain.   albuterol 108 (90 Base) MCG/ACT inhaler Commonly known as: ProAir HFA TAKE 2 PUFFS BY MOUTH EVERY 6 HOURS AS NEEDED FOR WHEEZE OR SHORTNESS OF BREATH   albuterol 108 (90 Base) MCG/ACT inhaler Commonly known as: VENTOLIN HFA Inhale 1 puff into the lungs every 4 (four) hours as needed for wheezing or shortness of breath.   albuterol (2.5 MG/3ML) 0.083% nebulizer solution Commonly known as: PROVENTIL Take 3 mLs (2.5 mg total) by nebulization every 6 (six) hours as needed for shortness of breath or wheezing.   aspirin 81 MG EC tablet Take 1 tablet (81 mg total) by mouth daily.   Breo Ellipta 200-25 MCG/INH Aepb Generic drug: fluticasone furoate-vilanterol Inhale 1 puff into the lungs daily.   budesonide 0.25 MG/2ML nebulizer solution Commonly known as: PULMICORT TAKE 1 VIAL (0.25 MG TOTAL) BY NEBULIZATION 2 (TWO) TIMES DAILY.   cloNIDine 0.2 MG tablet Commonly known as: CATAPRES Take 0.2 mg by mouth 2 (two) times daily. What changed: Another medication with the same name was removed. Continue taking this medication, and follow the directions you see here.   EPINEPHrine 0.3 mg/0.3 mL Soaj injection Commonly known as: EPI-PEN Inject 0.3 mg into the muscle as needed for anaphylaxis.   fluticasone 50 MCG/ACT nasal spray Commonly known as: FLONASE Place 1 spray into both nostrils daily as needed for allergies.   metFORMIN 500 MG tablet Commonly known as: GLUCOPHAGE Take 500 mg by mouth 2 (two) times daily. What changed: Another medication with the same name was removed. Continue taking this medication, and  follow the directions you see here.   Misc. Devices Misc Blood pressure monitor  Dx: Hypertension   Misc. Devices Misc Depends . Wt- 250 lbs, size 1X; Poise pads   multivitamin with minerals Tabs tablet Take 1 tablet by mouth daily.        Follow-up Information     Newlin, [name], MD Follow up in 1 week(s).   Specialty: Family Medicine Contact information: [address] [place] [address] [phone]         [facility] Follow up in 1 week(s).   Specialty: Pulmonology Contact information: [address] [place] [address] [phone]               Allergies  Allergen Reactions   Tomato Hives, Itching and Other (See Comments)    ALSO REACTS TO KETCHUP   Latex Itching and Rash   Latex Hives and Rash   Tomato Hives and Rash   Wool Alcohol [Lanolin] Itching    Consultations: None   Procedures/Studies: DG Chest 1 View  Result Date: 03/21/2021 CLINICAL DATA:  Shortness of breath for   3 days. EXAM: CHEST  1 VIEW COMPARISON:  January 20, 2021 FINDINGS: Cardiomediastinal silhouette may be mildly enlarged. Mediastinal contours appear intact. There is no evidence of focal airspace consolidation, pleural effusion or pneumothorax. Mild pulmonary vascular congestion. Osseous structures are without acute abnormality. Soft tissues are grossly normal. IMPRESSION: 1. Possible mildly enlarged cardiac silhouette. 2. Mild pulmonary vascular congestion. Electronically Signed   By: Fidela Salisbury M.D.   On: 03/21/2021 15:14   DG Chest Port 1 View  Result Date: 03/31/2021 CLINICAL DATA:  Shortness of breath. Cough and congestion with history of asthma EXAM: PORTABLE CHEST 1 VIEW COMPARISON:  03/21/2021 FINDINGS: Artifact from EKG leads. Normal heart size and mediastinal contours. No acute infiltrate or edema. No effusion or pneumothorax. No acute osseous findings. IMPRESSION: No active disease. Electronically Signed   By:  Monte Fantasia M.D.   On: 03/31/2021 04:44      Subjective: Patient was seen and examined at bedside.  Overnight events noted.  Patient reports feeling much improved.  Patient wants to be discharged.  Discharge Exam: Vitals:   04/02/21 0552 04/02/21 0826  BP: (!) 162/93   Pulse: 67   Resp: 18   Temp:    SpO2:  94%   Vitals:   04/01/21 2103 04/02/21 0448 04/02/21 0552 04/02/21 0826  BP: (!) 144/73 (!) 178/104 (!) 162/93   Pulse: 100 72 67   Resp: _0 Temp: 97.9 F (36.6 C) 97.8 F (36.6 C)    TempSrc:  Oral    SpO2: 91% 93%  94%  Height:        General: Pt is alert, awake, not in acute distress Cardiovascular: RRR, S1/S2 +, no rubs, no gallops Respiratory: CTA bilaterally, no wheezing, no rhonchi Abdominal: Soft, NT, ND, bowel sounds + Extremities: no edema, no cyanosis    The results of significant diagnostics from this hospitalization (including imaging, microbiology, ancillary and laboratory) are listed below for reference.     Microbiology: Recent Results (from the past 240 hour(s))  Resp Panel by RT-PCR (Flu A&B, Covid) Nasopharyngeal Swab     Status: None   Collection Time: 03/31/21  4:48 AM   Specimen: Nasopharyngeal Swab; Nasopharyngeal(NP) swabs in vial transport medium  Result Value Ref Range Status   SARS Coronavirus 2 by RT PCR NEGATIVE NEGATIVE Final    Comment: (NOTE) SARS-CoV-2 target nucleic acids are NOT DETECTED.  The SARS-CoV-2 RNA is generally detectable in upper respiratory specimens during the acute phase of infection. The lowest concentration of SARS-CoV-2 viral copies this assay can detect is 138 copies/mL. A negative result does not preclude SARS-Cov-2 infection and should not be used as the sole basis for treatment or other patient management decisions. A negative result may occur with  improper specimen collection/handling, submission of specimen other than nasopharyngeal swab, presence of viral mutation(s) within the areas  targeted by this assay, and inadequate number of viral copies(<138 copies/mL). A negative result must be combined with clinical observations, patient history, and epidemiological information. The expected result is Negative.  Fact Sheet for Patients:  EntrepreneurPulse.com.au  Fact Sheet for Healthcare Providers:  IncredibleEmployment.be  This test is no t yet approved or cleared by the [place] FDA and  has been authorized for detection and/or diagnosis of SARS-CoV-2 by FDA under an Emergency Use Authorization (EUA). This EUA will remain  in effect (meaning this test can be used) for the duration of the COVID-19 declaration under Section 564(b)(1) of the Act, 21 U.S.C.section 360bbb-3(b)(1),  unless the authorization is terminated  or revoked sooner.       Influenza A by PCR NEGATIVE NEGATIVE Final   Influenza B by PCR NEGATIVE NEGATIVE Final    Comment: (NOTE) The Xpert Xpress SARS-CoV-2/FLU/RSV plus assay is intended as an aid in the diagnosis of influenza from Nasopharyngeal swab specimens and should not be used as a sole basis for treatment. Nasal washings and aspirates are unacceptable for Xpert Xpress SARS-CoV-2/FLU/RSV testing.  Fact Sheet for Patients: EntrepreneurPulse.com.au  Fact Sheet for Healthcare Providers: IncredibleEmployment.be  This test is not yet approved or cleared by the [place] FDA and has been authorized for detection and/or diagnosis of SARS-CoV-2 by FDA under an Emergency Use Authorization (EUA). This EUA will remain in effect (meaning this test can be used) for the duration of the COVID-19 declaration under Section 564(b)(1) of the Act, 21 U.S.C. section 360bbb-3(b)(1), unless the authorization is terminated or revoked.  Performed at [facility], [place] [address], [place], [place] [address]      Labs: BNP (last 3 results) Recent Labs     07/02/20 2022  BNP 64.6   Basic Metabolic Panel: Recent Labs  Lab 03/31/21 0448 03/31/21 1232 04/01/21 0518  NA 138  --  135  K 3.7  --  4.9  CL 105  --  106  CO2 22  --  24  GLUCOSE 222*  --  134*  BUN 17  --  21*  CREATININE 1.03* 0.80 0.79  CALCIUM 8.7*  --  8.9   Liver Function Tests: Recent Labs  Lab 04/01/21 0518  AST 14*  ALT 15  ALKPHOS 51  BILITOT 0.3  PROT 6.9  ALBUMIN 3.5   No results for input(s): LIPASE, AMYLASE in the last 168 hours. No results for input(s): AMMONIA in the last 168 hours. CBC: Recent Labs  Lab 03/31/21 0448 03/31/21 1232 04/01/21 0518  WBC 8.0 8.5 17.3*  NEUTROABS 7.3  --   --   HGB 13.5 13.1 13.3  HCT 43.2 41.2 42.3  MCV 90.2 88.8 89.4  PLT 281 266 260   Cardiac Enzymes: No results for input(s): CKTOTAL, CKMB, CKMBINDEX, TROPONINI in the last 168 hours. BNP: Invalid input(s): POCBNP CBG: Recent Labs  Lab 04/01/21 1146 04/01/21 1652 04/01/21 1821 04/01/21 2059 04/02/21 0736  GLUCAP 89 137* 171* 111* 127*   D-Dimer No results for input(s): DDIMER in the last 72 hours. Hgb A1c Recent Labs    03/31/21 1232  HGBA1C 5.9*   Lipid Profile No results for input(s): CHOL, HDL, LDLCALC, TRIG, CHOLHDL, LDLDIRECT in the last 72 hours. Thyroid function studies No results for input(s): TSH, T4TOTAL, T3FREE, THYROIDAB in the last 72 hours.  Invalid input(s): FREET3 Anemia work up No results for input(s): VITAMINB12, FOLATE, FERRITIN, TIBC, IRON, RETICCTPCT in the last 72 hours. Urinalysis    Component Value Date/Time   COLORURINE YELLOW 08/07/2018 1752   APPEARANCEUR CLEAR 08/07/2018 1752   LABSPEC 1.033 (H) 08/07/2018 1752   PHURINE 5.0 08/07/2018 1752   GLUCOSEU NEGATIVE 08/07/2018 1752   HGBUR NEGATIVE 08/07/2018 1752   BILIRUBINUR NEGATIVE 08/07/2018 1752   BILIRUBINUR small 05/26/2017 1539   KETONESUR NEGATIVE 08/07/2018 1752   PROTEINUR NEGATIVE 08/07/2018 1752   UROBILINOGEN 1.0 05/26/2017 1539    UROBILINOGEN 1.0 03/22/2014 0805   NITRITE NEGATIVE 08/07/2018 1752   LEUKOCYTESUR NEGATIVE 08/07/2018 1752   Sepsis Labs Invalid input(s): PROCALCITONIN,  WBC,  LACTICIDVEN Microbiology Recent Results (from the past 240 hour(s))  Resp Panel  by RT-PCR (Flu A&B, Covid) Nasopharyngeal Swab     Status: None   Collection Time: 03/31/21  4:48 AM   Specimen: Nasopharyngeal Swab; Nasopharyngeal(NP) swabs in vial transport medium  Result Value Ref Range Status   SARS Coronavirus 2 by RT PCR NEGATIVE NEGATIVE Final    Comment: (NOTE) SARS-CoV-2 target nucleic acids are NOT DETECTED.  The SARS-CoV-2 RNA is generally detectable in upper respiratory specimens during the acute phase of infection. The lowest concentration of SARS-CoV-2 viral copies this assay can detect is 138 copies/mL. A negative result does not preclude SARS-Cov-2 infection and should not be used as the sole basis for treatment or other patient management decisions. A negative result may occur with  improper specimen collection/handling, submission of specimen other than nasopharyngeal swab, presence of viral mutation(s) within the areas targeted by this assay, and inadequate number of viral copies(<138 copies/mL). A negative result must be combined with clinical observations, patient history, and epidemiological information. The expected result is Negative.  Fact Sheet for Patients:  https://www.fda.gov/media/152166/download  Fact Sheet for Healthcare Providers:  https://www.fda.gov/media/152162/download  This test is no t yet approved or cleared by the [place] FDA and  has been authorized for detection and/or diagnosis of SARS-CoV-2 by FDA under an Emergency Use Authorization (EUA). This EUA will remain  in effect (meaning this test can be used) for the duration of the COVID-19 declaration under Section 564(b)(1) of the Act, 21 U.S.C.section 360bbb-3(b)(1), unless the authorization is terminated  or revoked  sooner.       Influenza A by PCR NEGATIVE NEGATIVE Final   Influenza B by PCR NEGATIVE NEGATIVE Final    Comment: (NOTE) The Xpert Xpress SARS-CoV-2/FLU/RSV plus assay is intended as an aid in the diagnosis of influenza from Nasopharyngeal swab specimens and should not be used as a sole basis for treatment. Nasal washings and aspirates are unacceptable for Xpert Xpress SARS-CoV-2/FLU/RSV testing.  Fact Sheet for Patients: https://www.fda.gov/media/152166/download  Fact Sheet for Healthcare Providers: https://www.fda.gov/media/152162/download  This test is not yet approved or cleared by the [place] FDA and has been authorized for detection and/or diagnosis of SARS-CoV-2 by FDA under an Emergency Use Authorization (EUA). This EUA will remain in effect (meaning this test can be used) for the duration of the COVID-19 declaration under Section 564(b)(1) of the Act, 21 U.S.C. section 360bbb-3(b)(1), unless the authorization is terminated or revoked.  Performed at [facility], [address] [place], [place] [address]      Time coordinating discharge: Over 30 minutes  SIGNED:   PARDEEP KUMAR, MD  [facility] 04/02/2021, 11:24 AM Pager   If 7PM-7AM, please contact night-coverage [email or web address]  

## 2021-04-02 NOTE — Plan of Care (Signed)
  Problem: Education: Goal: Knowledge of General Education information will improve Description: Including pain rating scale, medication(s)/side effects and non-pharmacologic comfort measures Outcome: Adequate for Discharge   Problem: Health Behavior/Discharge Planning: Goal: Ability to manage health-related needs will improve Outcome: Adequate for Discharge   Problem: Clinical Measurements: Goal: Ability to maintain clinical measurements within normal limits will improve Outcome: Adequate for Discharge Goal: Will remain free from infection Outcome: Adequate for Discharge Goal: Diagnostic test results will improve Outcome: Adequate for Discharge Goal: Respiratory complications will improve 04/02/2021 1344 by Mariann Laster, RN Outcome: Adequate for Discharge 04/02/2021 1336 by Mariann Laster, RN Outcome: Progressing Goal: Cardiovascular complication will be avoided Outcome: Adequate for Discharge   Problem: Activity: Goal: Risk for activity intolerance will decrease Outcome: Adequate for Discharge   Problem: Nutrition: Goal: Adequate nutrition will be maintained Outcome: Adequate for Discharge   Problem: Coping: Goal: Level of anxiety will decrease Outcome: Adequate for Discharge   Problem: Elimination: Goal: Will not experience complications related to bowel motility Outcome: Adequate for Discharge Goal: Will not experience complications related to urinary retention Outcome: Adequate for Discharge   Problem: Pain Managment: Goal: General experience of comfort will improve Outcome: Adequate for Discharge   Problem: Safety: Goal: Ability to remain free from injury will improve Outcome: Adequate for Discharge   Problem: Skin Integrity: Goal: Risk for impaired skin integrity will decrease Outcome: Adequate for Discharge

## 2021-04-02 NOTE — Discharge Instructions (Signed)
Advised to follow up PCP in one week. Advised to continue inhalers and nebulization as needed. Follow up [facility] as scheduled.

## 2021-04-05 ENCOUNTER — Telehealth: Payer: Self-pay

## 2021-04-05 NOTE — Telephone Encounter (Signed)
Transition Care Management Follow-up Telephone Call Date of discharge and from where: 04/02/2021 from [facility] How have you been since you were released from the hospital? Pt stated that she is feeling much better.  Any questions or concerns? No  Items Reviewed: Did the pt receive and understand the discharge instructions provided? Yes  Medications obtained and verified? Yes  Other? No  Any new allergies since your discharge? No  Dietary orders reviewed? Yes Do you have support at home? Yes   Functional Questionnaire: (I = Independent and D = Dependent) ADLs: I  Bathing/Dressing- I  Meal Prep- I  Eating- I  Maintaining continence- I  Transferring/Ambulation- I  Managing Meds- I   Follow up appointments reviewed:  PCP Hospital f/u appt confirmed? Yes  Scheduled to see Shan Levans, MD on 04/07/2021 @ 10:30am. Specialist Hospital f/u appt confirmed? No   Are transportation arrangements needed? No  If their condition worsens, is the pt aware to call PCP or go to the Emergency Dept.? Yes Was the patient provided with contact information for the PCP's office or ED? Yes Was to pt encouraged to call back with questions or concerns? Yes

## 2021-04-07 ENCOUNTER — Encounter: Payer: Self-pay | Admitting: Critical Care Medicine

## 2021-04-07 ENCOUNTER — Ambulatory Visit: Payer: Medicaid Other | Attending: Critical Care Medicine | Admitting: Critical Care Medicine

## 2021-04-07 ENCOUNTER — Other Ambulatory Visit: Payer: Self-pay

## 2021-04-07 DIAGNOSIS — Z79899 Other long term (current) drug therapy: Secondary | ICD-10-CM | POA: Insufficient documentation

## 2021-04-07 DIAGNOSIS — I509 Heart failure, unspecified: Secondary | ICD-10-CM | POA: Diagnosis not present

## 2021-04-07 DIAGNOSIS — R21 Rash and other nonspecific skin eruption: Secondary | ICD-10-CM | POA: Diagnosis not present

## 2021-04-07 DIAGNOSIS — Z7982 Long term (current) use of aspirin: Secondary | ICD-10-CM | POA: Insufficient documentation

## 2021-04-07 DIAGNOSIS — Z8249 Family history of ischemic heart disease and other diseases of the circulatory system: Secondary | ICD-10-CM | POA: Diagnosis not present

## 2021-04-07 DIAGNOSIS — I10 Essential (primary) hypertension: Secondary | ICD-10-CM | POA: Diagnosis not present

## 2021-04-07 DIAGNOSIS — I110 Hypertensive heart disease with heart failure: Secondary | ICD-10-CM | POA: Insufficient documentation

## 2021-04-07 DIAGNOSIS — J45909 Unspecified asthma, uncomplicated: Secondary | ICD-10-CM | POA: Diagnosis not present

## 2021-04-07 DIAGNOSIS — G8929 Other chronic pain: Secondary | ICD-10-CM | POA: Diagnosis not present

## 2021-04-07 DIAGNOSIS — E119 Type 2 diabetes mellitus without complications: Secondary | ICD-10-CM | POA: Insufficient documentation

## 2021-04-07 DIAGNOSIS — G4733 Obstructive sleep apnea (adult) (pediatric): Secondary | ICD-10-CM | POA: Diagnosis not present

## 2021-04-07 DIAGNOSIS — Z87891 Personal history of nicotine dependence: Secondary | ICD-10-CM | POA: Insufficient documentation

## 2021-04-07 DIAGNOSIS — Z7984 Long term (current) use of oral hypoglycemic drugs: Secondary | ICD-10-CM | POA: Diagnosis not present

## 2021-04-07 DIAGNOSIS — Z7951 Long term (current) use of inhaled steroids: Secondary | ICD-10-CM | POA: Insufficient documentation

## 2021-04-07 NOTE — Progress Notes (Signed)
Subjective:    Patient ID: Kathleen Rodriguez, female    DOB: Sep 12, 1966, 55 y.o.   MRN: [id number]  08/2018 55 y.o.F with a chronic right lower extremity ulceration with severe pain.  Patient has had several hospitalizations for this with treatment of presumed cellulitis.  Patient also has history of severe persistent asthma.  Patient was given pain management at this visit.  She has an established dermatologist and the current diagnosis is that of inflammatory psoriatic psoriasis.  She is in need of a biopsy for confirmation this is not yet been performed.  Out of hydroxizyne    04/07/2021 Last seen 08/2018    needs form filled out  This patient had a previous left lower extremity ulceration which started with impetigo and infection and ulceration and sloughing of the skin.  With intensive wound care this is completely healed up.  Patient has a form to fill out to be able to donate plasma and they wanted someone to assess the wound to make sure that heal before she can go back to donating plasma  Patient does have a history of hypertension is well controlled blood pressure today 135/80  Past Medical History:  Diagnosis Date   Arthritis    "knees, lower back; legs, ankles" (01/27/2016)   Asthma    followed by Dr. 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   Diabetes mellitus without complication ([place])    Dyspnea    GERD (gastroesophageal reflux disease)    Hypertension    "doctor took me off RX in 2016" (01/27/2016)   Hypertension    Influenza B April 7482   Complicated by multi-organ failure   OSA on CPAP "since " 03/20/2013   Pneumonia 2016   Required emergent intubation    asthma exacerbation in 2016   Skin ulcer ([place]) secondary to bullous impetigo /trauma 08/29/2018   Sleep apnea    Tobacco abuse    Upper airway cough syndrome      Family History  Problem Relation  Age of Onset   Asthma Mother    Heart murmur Mother    Cancer Maternal Grandmother    Heart disease Maternal Grandmother    Hypertension Maternal Grandmother    Cancer Paternal Grandmother    Hypertension Mother    HIV/AIDS Father      Social History   Socioeconomic History   Marital status: Married    Spouse name: Not on file   Number of children: Not on file   Years of education: Not on file   Highest education level: Not on file  Occupational History   Not on file  Tobacco Use   Smoking status: Former    Packs/day: 0.25    Years: 21.00    Pack years: 5.25    Types: Cigarettes    Start date: 21    Quit date: 01/31/2013    Years since quitting: 8.1   Smokeless tobacco: Never  Vaping Use   Vaping Use: Never used  Substance and Sexual Activity   Alcohol use: Not on file    Comment: 01/26/2006 "clean from drinking since 2007"   Drug use: Not Currently    Types: Cocaine, "Crack" cocaine    Comment: 01/27/2016 "clean from smoking crack since 2007"   Sexual activity: Not Currently    Birth control/protection: None, Surgical  Other  Topics Concern   Not on file  Social History Narrative   ** Merged History Encounter **       Social Determinants of Health   Financial Resource Strain: Not on file  Food Insecurity: Not on file  Transportation Needs: Unmet Transportation Needs   Lack of Transportation (Medical): Yes   Lack of Transportation (Non-Medical): Not on file  Physical Activity: Not on file  Stress: Not on file  Social Connections: Not on file  Intimate Partner Violence: Not on file     Allergies  Allergen Reactions   Tomato Hives, Itching and Other (See Comments)    ALSO REACTS TO KETCHUP   Latex Itching and Rash   Latex Hives and Rash   Tomato Hives and Rash   Wool Alcohol [Lanolin] Itching     Outpatient Medications Prior to Visit  Medication Sig Dispense Refill   acetaminophen (TYLENOL) 500 MG tablet Take 500 mg by mouth every 6 (six) hours as  needed for mild pain.     albuterol (PROAIR HFA) 108 (90 Base) MCG/ACT inhaler TAKE 2 PUFFS BY MOUTH EVERY 6 HOURS AS NEEDED FOR WHEEZE OR SHORTNESS OF BREATH 8.5 each 3   albuterol (PROVENTIL) (2.5 MG/3ML) 0.083% nebulizer solution Take 3 mLs (2.5 mg total) by nebulization every 6 (six) hours as needed for shortness of breath or wheezing. 75 mL 12   aspirin 81 MG EC tablet Take 1 tablet (81 mg total) by mouth daily. 30 tablet 3   budesonide (PULMICORT) 0.25 MG/2ML nebulizer solution TAKE 1 VIAL (0.25 MG TOTAL) BY NEBULIZATION 2 (TWO) TIMES DAILY. 60 mL 12   cloNIDine (CATAPRES) 0.2 MG tablet Take 0.2 mg by mouth 2 (two) times daily.     EPINEPHrine 0.3 mg/0.3 mL IJ SOAJ injection Inject 0.3 mg into the muscle as needed for anaphylaxis. 1 each 2   fluticasone (FLONASE) 50 MCG/ACT nasal spray Place 1 spray into both nostrils daily as needed for allergies. 16 g 11   fluticasone furoate-vilanterol (BREO ELLIPTA) 200-25 MCG/INH AEPB Inhale 1 puff into the lungs daily. 1 each 5   glucose blood (ACCU-CHEK GUIDE) test strip Use as instructed daily before breakfast. Dx; Prediabetets; Prednisone use 30 each 12   metFORMIN (GLUCOPHAGE) 500 MG tablet Take 500 mg by mouth 2 (two) times daily.     Misc. Devices MISC Blood pressure monitor  Dx: Hypertension 1 each 0   Misc. Devices MISC Depends . Wt- 250 lbs, size 1X; Poise pads 3 each 11   Multiple Vitamin (MULTIVITAMIN WITH MINERALS) TABS tablet Take 1 tablet by mouth daily.     albuterol (VENTOLIN HFA) 108 (90 Base) MCG/ACT inhaler Inhale 1 puff into the lungs every 4 (four) hours as needed for wheezing or shortness of breath. 1 each 2   Blood Glucose Monitoring Suppl (ACCU-CHEK GUIDE ME) w/Device KIT 1 each by Does not apply route daily. Dx; Prediabetets; Prednisone use 1 kit 0   No facility-administered medications prior to visit.      Review of Systems     Objective:   Physical Exam Vitals:   04/07/21 1001 04/07/21 1010  BP: (!) 142/93 135/80   Pulse: 90   Resp: 16   Temp: 98.7 F (37.1 C)   SpO2: 98%   Weight: 258 lb (117 kg)     Gen: Pleasant, well-nourished, in no distress,  normal affect  ENT: No lesions,  mouth clear,  oropharynx clear, no postnasal drip  Neck: No JVD, no TMG, no carotid bruits  Lungs: No use of accessory muscles, no dullness to percussion, clear without rales or rhonchi  Cardiovascular: RRR, heart sounds normal, no murmur or gallops, no peripheral edema  Abdomen: soft and NT, no HSM,  BS normal  Musculoskeletal: No deformities, no cyanosis or clubbing  Neuro: alert, non focal  Skin: Warm, ulceration over the left lower extremity is completely healed    Assessment & Plan:  I personally reviewed all images and lab data in the [facility] system as well as any outside material available during this office visit and agree with the  radiology impressions.   Essential hypertension Well-controlled at this time   Anquanette was seen today for rash.  Diagnoses and all orders for this visit:  Essential hypertension  History of ulceration left lower extremity now resolved  Patient is cleared medically from the standpoint of her skin condition for further plasma donation in the form was completed

## 2021-04-07 NOTE — Assessment & Plan Note (Signed)
Well-controlled  at this time 

## 2021-04-07 NOTE — Patient Instructions (Signed)
The wound of the left leg has healed you are cleared from that perspective to proceed with plasma donation would recommend not over utilizing that donation process so as to not deplete yourself  No change in medications

## 2021-04-07 NOTE — Progress Notes (Signed)
Need documentation to continue with plasma donation  Follow up with rash on left leg

## 2021-04-12 ENCOUNTER — Other Ambulatory Visit: Payer: Self-pay | Admitting: Obstetrics and Gynecology

## 2021-04-12 ENCOUNTER — Other Ambulatory Visit: Payer: Self-pay

## 2021-04-12 NOTE — Patient Outreach (Signed)
Medicaid Managed Care   Nurse Care Manager Note  04/12/2021 Name:  Kathleen Rodriguez MRN:  [id number] DOB:  [birth year]  Kathleen Rodriguez is an 55 y.o. year old female who is a primary patient of Kathleen Rakes, MD.  The [facility] Managed Care Coordination team was consulted for assistance with:    Chronic healthcare management needs.  Kathleen Rodriguez was given information about Medicaid Managed Care Coordination team services today. Kathleen Rodriguez agreed to services and verbal consent obtained.  Engaged with patient by telephone for follow up visit in response to provider referral for case management and/or care coordination services.   Assessments/Interventions:  Review of past medical history, allergies, medications, health status, including review of consultants reports, laboratory and other test data, was performed as part of comprehensive evaluation and provision of chronic care management services.  SDOH (Social Determinants of Health) assessments and interventions performed:   Care Plan  Allergies  Allergen Reactions   Tomato Hives, Itching and Other (See Comments)    ALSO REACTS TO KETCHUP   Latex Itching and Rash   Latex Hives and Rash   Tomato Hives and Rash   Wool Alcohol [Lanolin] Itching    Medications Reviewed Today     Reviewed by Kathleen Medicus, RN (Registered Nurse) on 04/12/21 at 67  Med List Status: <None>   Medication Order Taking? Sig Documenting Provider Last Dose Status Informant  acetaminophen (TYLENOL) 500 MG tablet [id number] No Take 500 mg by mouth every 6 (six) hours as needed for mild pain. [provider] Taking Active Self  albuterol (PROAIR HFA) 108 (90 Base) MCG/ACT inhaler [id number] No TAKE 2 PUFFS BY MOUTH EVERY 6 HOURS AS NEEDED FOR WHEEZE OR SHORTNESS OF Kathleen Mile, MD Taking Active   albuterol (PROVENTIL) (2.5 MG/3ML) 0.083% nebulizer solution [id number] No Take 3 mLs (2.5 mg total) by nebulization every 6 (six) hours as needed for  shortness of breath or wheezing. Kathleen Clamp, MD Taking Active   aspirin 81 MG EC tablet [id number] No Take 1 tablet (81 mg total) by mouth daily. Kathleen Rakes, MD Taking Active Self           Med Note Kathleen Rodriguez, Kathleen Rodriguez   Wed Mar 31, 2021  8:32 AM) Needs refill  Blood Glucose Monitoring Suppl (ACCU-CHEK GUIDE ME) w/Device KIT [id number] No 1 each by Does not apply route daily. Dx; Prediabetets; Prednisone use Kathleen Rakes, MD Taking Active Self  budesonide (PULMICORT) 0.25 MG/2ML nebulizer solution [id number] No TAKE 1 VIAL (0.25 MG TOTAL) BY NEBULIZATION 2 (TWO) TIMES DAILY. Kathleen Clamp, MD Taking Active   cloNIDine (CATAPRES) 0.2 MG tablet [id number] No Take 0.2 mg by mouth 2 (two) times daily. [provider] Taking Active Self  EPINEPHrine 0.3 mg/0.3 mL IJ SOAJ injection [id number] No Inject 0.3 mg into the muscle as needed for anaphylaxis. Kathleen Rodriguez Taking Active Self  fluticasone (FLONASE) 50 MCG/ACT nasal spray [id number] No Place 1 spray into both nostrils daily as needed for allergies. Kathleen Mires, MD Taking Active Self  fluticasone furoate-vilanterol (BREO ELLIPTA) 200-25 MCG/INH AEPB [id number] No Inhale 1 puff into the lungs daily. Kathleen Rodriguez Taking Active Self  glucose blood (ACCU-CHEK GUIDE) test strip [id number] No Use as instructed daily before breakfast. Dx; Prediabetets; Prednisone use Kathleen Rakes, MD Taking Active Self  metFORMIN (GLUCOPHAGE) 500 MG tablet [id number] No Take 500 mg by mouth 2 (two) times daily. [provider] Taking Active Self  Med Note Kathleen Rodriguez   Wed Mar 31, 2021  8:35 AM)    Misc. Devices MISC [id number] No Blood pressure monitor  Dx: Hypertension Kathleen Rakes, MD Taking Active Self  Misc. Devices [place] [id number] No Depends . Wt- 250 lbs, size 1X; Poise pads Kathleen Rakes, MD Taking Active Self  Multiple Vitamin (MULTIVITAMIN WITH MINERALS) TABS tablet [id number] No Take 1 tablet by mouth  daily. 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   Asthma 10/22/2017   Osteoarthritis of left knee 09/01/2016   Total knee replacement status 09/01/2016   Osteoarthritis of knees, 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   Primary osteoarthritis of left knee 03/09/2016   Anemia, iron deficiency 08/12/2015   Dysfunctional uterine bleeding 12/28/2014   Morbid obesity ([place]) 05/16/2014   Upper airway cough syndrome 05/01/2014   GERD (gastroesophageal reflux disease) 04/08/2014   OSA (obstructive sleep apnea) 03/20/2013    Conditions to be addressed/monitored per PCP order:   chronic healthcare management needs, OSA, asthma, HTN, GERD,  COPD, DM, osteoarthritis.  Care Plan : General Plan of Care (Adult)  Updates made by Kathleen Medicus, RN since 04/12/2021 12:00 AM     Problem: Health Promotion or Disease Self-Management (General Plan of Care)   Priority: High  Onset Date: 02/24/2021     Long-Range Goal: Self-Management Plan Developed   Start Date: 02/24/2021  Expected End Date: 05/27/2021  Recent Progress: Not on track  Priority: High  Note:   Current Barriers:  Ineffective Self Health Maintenance-patient not checking blood sugars-needs to pick glucometer up at pharmacy.  Also, checking blood pressure irregularly. Update 04/12/21:  Patient states she is checking blood sugar twice a day and checking blood pressure daily.  Patient complains of knee pain today. Transportation issues, patient utilized [facility] transportation and was told no available drivers. Update 04/12/21:  Patient has [facility] as a resource as well. Currently UNABLE TO independently self manage needs related to  chronic health conditions.  Knowledge Deficits related to short term plan for care coordination needs and long term plans for chronic disease management needs Nurse Case Manager Clinical Goal(s):  patient will work with care management team to address care coordination and chronic disease management needs related to Disease Management Educational Needs Care Coordination Medication Management and Education Medication Reconciliation Medication Assistance  Psychosocial Support   Interventions:  Evaluation of current treatment plan and patient's adherence to plan as established by provider. Reviewed medications with patient. Collaborated with pharmacy regarding medications. Discussed plans with patient for ongoing care management follow up and provided patient with direct contact information for care management team Advised patient, providing education and rationale, to monitor blood pressure daily and record, calling provider for findings outside established parameters.  Reviewed scheduled/upcoming provider appointments. Update 04/12/21:  Patient to schedule appointment with Dr. Erlinda Hong for evaluation of knee pain. Advised patient, providing education and rationale, to check cbg and record, calling provider for findings outside established parameters.   Care Guide referral for transportation. Update 04/12/21:  RNCM provided transportation resources to patient. Pharmacy referral for medication review. Update 04/12/21:  Patient has met with Pharmacist and continues to follow. Self Care Activities:  Patient  will self administer medications as prescribed Patient will attend all scheduled provider appointments Patient will call pharmacy for medication refills Patient will continue to perform ADL's independently Patient will call provider office for new concerns or questions Patient Goals: In the next 30 days, patient will check blood pressure and blood sugars as directed by provider. In the next 30  days, patient will attend all scheduled appointments. In the next 30 days, patient will meet with Pharmacist to discuss medications.- Follow Up Plan: The patient has been provided with contact information for the care management team and has been advised to call with any health related questions or concerns.  The care management team will reach out to the patient again over the next 30 days.     Evidence-based guidance:   Review biopsychosocial determinants of health screens.  Review need for preventive screening based on age, sex, family history and health history.  Determine level of modifiable health risk. .  Discuss identified risks.  Identify areas where behavior change may lead to improved health.  Promote healthy lifestyle.  Evoke change talk using open-ended questions, pros and cons, as well as looking forward.  Identify and manage conditions or preconditions to reduce health risk.  Implement additional goals and interventions based on identified risk factors.      Follow Up:  Patient agrees to Care Plan and Follow-up.  Plan: The Managed Medicaid care management team will reach out to the patient again over the next 30 days. and The patient has been provided with contact information for the Managed Medicaid care management team and has been advised to call with any health related questions or concerns.  Date/time of next scheduled RN care management/care coordination outreach: 05/12/21 at 1030.

## 2021-04-12 NOTE — Patient Instructions (Signed)
Hi Kathleen Rodriguez, thank you for speaking with me today.  Kathleen Rodriguez was given information about Medicaid Managed Care team care coordination services as a part of their [place] Medicaid benefit. Kathleen Rodriguez verbally consented to engagement with the [facility] Managed Care team.   For questions related to your [facility], please call: [phone] or visit the homepage here: [email or web address]  If you would like to schedule transportation through your [facility], please call the following number at least 2 days in advance of your appointment: [phone].   Call the [facility] at [phone], at any time, 24 hours a day, 7 days a week. If you are in danger or need immediate medical attention call 911.  Kathleen Rodriguez - following are the goals we discussed in your visit today:   Goals Addressed             This Visit's Progress    Protect My Health       Timeframe:  Long-Range Goal Priority:  High Start Date:        02/24/21                     Expected End Date:       ongoing            Follow Up Date: 05/12/21   - schedule appointment for flu shot - schedule appointment for vaccines needed due to my age or health - schedule recommended health tests (blood work, mammogram, colonoscopy, pap test) - schedule and keep appointment for annual check-up   Update 04/12/21:  Patient attending all appointments.   Why is this important?   Screening tests can find diseases early when they are easier to treat.  Your doctor or nurse will talk with you about which tests are important for you.  Getting shots for common diseases like the flu and shingles will help prevent them.           Patient verbalizes understanding of instructions provided today.   The Managed Medicaid care management team will reach out to the patient again over the next 30 days.   The patient has been provided with contact information for the Managed Medicaid care management team and has been advised to call with any health related questions or concerns.   [name] RN, BSN [place]  [facility] - Managed Medicaid High Risk [phone].    Following is a copy of your plan of care:  Patient Care Plan: General Plan of Care (Adult)     Problem Identified: Health Promotion or Disease Self-Management (General Plan of Care)   Priority: High  Onset Date: 02/24/2021   Long-Range Goal: Self-Management Plan Developed   Start Date: 02/24/2021  Expected End Date: 05/27/2021  Recent Progress: Not on track  Priority: High  Note:   Current Barriers:  Ineffective Self Health Maintenance-patient not checking blood sugars-needs to pick glucometer up at pharmacy.  Also, checking blood pressure irregularly. Update 04/12/21:  Patient states she is checking blood sugar twice a day and checking blood pressure daily.  Patient complains of knee pain today. Transportation issues, patient utilized [facility] transportation and was told no available drivers. Update 04/12/21:  Patient has [facility] as a resource as well. Currently UNABLE TO independently self manage needs related to chronic health conditions.  Knowledge Deficits related to short term plan for care coordination needs and long  term plans for chronic disease management needs Nurse Case Manager Clinical Goal(s):  patient will work with care management team to address care coordination and chronic disease management needs related to Disease Management Educational Needs Care Coordination Medication Management and Education Medication Reconciliation Medication Assistance  Psychosocial Support   Interventions:  Evaluation of current treatment plan and patient's adherence to plan as established by provider. Reviewed medications with patient. Collaborated with pharmacy  regarding medications. Discussed plans with patient for ongoing care management follow up and provided patient with direct contact information for care management team Advised patient, providing education and rationale, to monitor blood pressure daily and record, calling provider for findings outside established parameters.  Reviewed scheduled/upcoming provider appointments. Update 04/12/21:  Patient to schedule appointment with Dr. Erlinda Hong for evaluation of knee pain. Advised patient, providing education and rationale, to check cbg and record, calling provider for findings outside established parameters.   Care Guide referral for transportation. Update 04/12/21:  RNCM provided transportation resources to patient. Pharmacy referral for medication review. Update 04/12/21:  Patient has met with Pharmacist and continues to follow. Self Care Activities:  Patient will self administer medications as prescribed Patient will attend all scheduled provider appointments Patient will call pharmacy for medication refills Patient will continue to perform ADL's independently Patient will call provider office for new concerns or questions Patient Goals: In the next 30 days, patient will check blood pressure and blood sugars as directed by provider. In the next 30 days, patient will attend all scheduled appointments. In the next 30 days, patient will meet with Pharmacist to discuss medications.- Follow Up Plan: The patient has been provided with contact information for the care management team and has been advised to call with any health related questions or concerns.  The care management team will reach out to the patient again over the next 30 days.    Evidence-based guidance:   Review biopsychosocial determinants of health screens.  Review need for preventive screening based on age, sex, family history and health history.  Determine level of modifiable health risk. .  Discuss identified risks.  Identify areas  where behavior change may lead to improved health.  Promote healthy lifestyle.  Evoke change talk using open-ended questions, pros and cons, as well as looking forward.  Identify and manage conditions or preconditions to reduce health risk.  Implement additional goals and interventions based on identified risk factors.

## 2021-04-13 ENCOUNTER — Other Ambulatory Visit: Payer: Self-pay

## 2021-04-13 NOTE — Patient Instructions (Signed)
Visit Information  Kathleen Rodriguez was given information about Medicaid Managed Care team care coordination services as a part of their [place] Medicaid benefit. Kathleen Rodriguez verbally consented to engagement with the [facility] Managed Care team.   For questions related to your [facility], please call: [phone] or visit the homepage here: [email or web address]  If you would like to schedule transportation through your [facility], please call the following number at least 2 days in advance of your appointment: [phone].   Call the [place] at [phone], at any time, 24 hours a day, 7 days a week. If you are in danger or need immediate medical attention call 911.  Kathleen Rodriguez - following are the goals we discussed in your visit today:   Goals Addressed   None     Please see education materials related to COPD provided as print materials.   Patient verbalizes understanding of instructions provided today.   The patient has been provided with contact information for the Managed Medicaid care management team and has been advised to call with any health related questions or concerns.   Kathleen Rodriguez, Pharm.D., Managed Medicaid Pharmacist [phone]   Following is a copy of your plan of care:  Patient Care Plan: General Plan of Care (Adult)     Problem Identified: Health Promotion or Disease Self-Management (General Plan of Care)   Priority: High  Onset Date: 02/24/2021     Problem Identified: Health Promotion or Disease Self-Management (General Plan of Care)   Priority: High  Onset Date: 02/24/2021     Long-Range Goal: Self-Management Plan Developed   Start Date: 02/24/2021  Expected End Date: 05/27/2021  Recent Progress: Not on track  Priority: High  Note:   Current Barriers:  Ineffective Self Health Maintenance-patient not  checking blood sugars-needs to pick glucometer up at pharmacy.  Also, checking blood pressure irregularly. Update 04/12/21:  Patient states she is checking blood sugar twice a day and checking blood pressure daily.  Patient complains of knee pain today. Transportation issues, patient utilized [facility] transportation and was told no available drivers. Update 04/12/21:  Patient has Martha as a resource as well. Currently UNABLE TO independently self manage needs related to chronic health conditions.  Knowledge Deficits related to short term plan for care coordination needs and long term plans for chronic disease management needs Nurse Case Manager Clinical Goal(s):  patient will work with care management team to address care coordination and chronic disease management needs related to Disease Management Educational Needs Care Coordination Medication Management and Education Medication Reconciliation Medication Assistance  Psychosocial Support   Interventions:  Evaluation of current treatment plan and patient's adherence to plan as established by provider. Reviewed medications with patient. Collaborated with pharmacy regarding medications. Discussed plans with patient for ongoing care management follow up and provided patient with direct contact information for care management team Advised patient, providing education and rationale, to monitor blood pressure daily and record, calling provider for findings outside established parameters.  Reviewed scheduled/upcoming provider appointments. Update 04/12/21:  Patient to schedule appointment with Dr. Erlinda Rodriguez for evaluation of knee pain. Advised patient, providing education and rationale, to check cbg and record, calling provider for findings outside established parameters.   Care Guide referral for transportation. Update 04/12/21:  RNCM provided transportation resources to patient. Pharmacy referral for medication review. Update 04/12/21:   Patient has met with Pharmacist and continues to follow. Self Care Activities:  Patient will self administer medications as prescribed Patient will attend all scheduled provider appointments Patient will call pharmacy for medication refills Patient will continue to perform ADL's independently Patient will call provider office for new concerns or questions Patient Goals: In the next 30 days, patient will check blood pressure and blood sugars as directed by provider. In the next 30 days, patient will attend all scheduled appointments. In the next 30 days, patient will meet with Pharmacist to discuss medications.- Follow Up Plan: The patient has been provided with contact information for the care management team and has been advised to call with any health related questions or concerns.  The care management team will reach out to the patient again over the next 30 days.     Evidence-based guidance:   Review biopsychosocial determinants of health screens.  Review need for preventive screening based on age, sex, family history and health history.  Determine level of modifiable health risk. .  Discuss identified risks.  Identify areas where behavior change may lead to improved health.  Promote healthy lifestyle.  Evoke change talk using open-ended questions, pros and cons, as well as looking forward.  Identify and manage conditions or preconditions to reduce health risk.  Implement additional goals and interventions based on identified risk factors.     Task: Mutually Develop and Kathleen Rodriguez Achievement of Patient Goals   Note:   Care Management Activities:    - verbalization of feelings encouraged    Notes:     Patient Care Plan: Medication Management     Problem Identified: Health Promotion or Disease Self-Management (General Plan of Care)      Goal: Medication Management   Note:   Current Barriers:  Unable to independently monitor therapeutic efficacy Does not adhere to  prescribed medication regimen Does not maintain contact with provider office Does not contact provider office for questions/concerns   Pharmacist Clinical Goal(s):  Over the next 30 days, patient will achieve adherence to monitoring guidelines and medication adherence to achieve therapeutic efficacy contact provider office for questions/concerns as evidenced notation of same in electronic health record through collaboration with PharmD and provider.    Interventions: Inter-disciplinary care team collaboration (see longitudinal plan of care) Comprehensive medication review performed; medication list updated in electronic medical record  _0 @ _1 @ _2 @ _3 @  Patient Goals/Self-Care Activities Over the next 30 days, patient will:  - take medications as prescribed check blood pressure Weekly, document, and provide at future appointments  Follow Up Plan: The care management team will reach out to the patient again over the next 30 days.      Task: Mutually Develop and Kathleen Rodriguez Achievement of Patient Goals   Note:   Care Management Activities:    - verbalization of feelings encouraged    Notes:

## 2021-04-13 NOTE — Patient Outreach (Signed)
Medicaid Managed Care    Pharmacy Note  04/13/2021 Name: Kathleen Rodriguez MRN: [id number] DOB: 04-Apr-1966  [name] is a 55 y.o. year old female who is a primary care patient of [name], MD. The [facility] Managed Care Coordination team was consulted for assistance with disease management and care coordination needs.    Engaged with patient Engaged with patient by telephone for follow up visit in response to referral for case management and/or care coordination services.  Ms. Shelden was given information about Managed Medicaid Care Coordination team services today. [name] agreed to services and verbal consent obtained.   Objective:  Lab Results  Component Value Date   CREATININE 0.79 04/01/2021   CREATININE 0.80 03/31/2021   CREATININE 1.03 (H) 03/31/2021    Lab Results  Component Value Date   HGBA1C 5.9 (H) 03/31/2021       Component Value Date/Time   CHOL 172 12/02/2020 1143   TRIG 111 12/02/2020 1143   HDL 53 12/02/2020 1143   CHOLHDL 3.2 12/02/2020 1143   CHOLHDL 2.2 12/25/2018 0650   VLDL 3 12/25/2018 0650   LDLCALC 99 12/02/2020 1143    Other: (TSH, CBC, Vit D, etc.)  Clinical ASCVD: No  The 10-year ASCVD risk score [name] DC Jr., et al., 2013) is: 12.3%   Values used to calculate the score:     Age: 70 years     Sex: Female     Is Non-Hispanic African American: Yes     Diabetic: Yes     Tobacco smoker: No     Systolic Blood Pressure: 599 mmHg     Is BP treated: Yes     HDL Cholesterol: 53 mg/dL     Total Cholesterol: 172 mg/dL    Other: (CHADS2VASc if Afib, PHQ9 if depression, MMRC or CAT for COPD, ACT, DEXA)  BP Readings from Last 3 Encounters:  04/07/21 135/80  04/02/21 (!) 162/93  03/21/21 (!) 143/74    Assessment/Interventions: Review of patient past medical history, allergies, medications, health status, including review of consultants reports, laboratory and other test data, was performed as part of comprehensive evaluation and  provision of chronic care management services.   DM Metformin 576m  Plan: At goal,  patient stable/ symptoms controlled   BP/Hot Flashes Clonidine 0.155mBID -not checking BP at home Plan: Check BP 1/month  Nerve Pain Before Meds: Patient really couldn't answer the question After Meds: 7 Gabapentin Plan: I asked patient about openness to changing therapy and she wasn't. At goal,  patient stable/ symptoms controlled  Asthma Albuterol Neb: Uses both nebs together Budesonide Neb: Uses both nebs together Ipratroprium Neb: PRN -Uses above 2x/week ProAir States Breo covered sometimes and sometimes not -Patient taking ProAir and Albuterol together. May 2022 [place]. Spoke with MeAilene Ravelnot approved. Asked to re-fax June2022: Still not approved, called Pharmacy and they said PA hadn't gone through but they had faxed it. Msg'd Dr. TaRexene Edisono ask her to compete the PA or to send in a preferred medication for patient since she was hospitalized twice in past month  SDOH (Social Determinants of Health) assessments and interventions performed:    Care Plan  Allergies  Allergen Reactions   Tomato Hives, Itching and Other (See Comments)    ALSO REACTS TO KETCHUP   Latex Itching and Rash   Latex Hives and Rash   Tomato Hives and Rash   Wool Alcohol [Lanolin] Itching  Medications Reviewed Today     Reviewed by [name], RN (Registered Nurse) on 04/12/21 at 106  Med List Status: <None>   Medication Order Taking? Sig Documenting Provider Last Dose Status Informant  acetaminophen (TYLENOL) 500 MG tablet [id number] No Take 500 mg by mouth every 6 (six) hours as needed for mild pain. [provider] Taking Active Self  albuterol (PROAIR HFA) 108 (90 Base) MCG/ACT inhaler [id number] No TAKE 2 PUFFS BY MOUTH EVERY 6 HOURS AS NEEDED FOR WHEEZE OR SHORTNESS OF Cipriano Mile, MD Taking Active   albuterol (PROVENTIL)  (2.5 MG/3ML) 0.083% nebulizer solution [id number] No Take 3 mLs (2.5 mg total) by nebulization every 6 (six) hours as needed for shortness of breath or wheezing. Shawna Clamp, MD Taking Active   aspirin 81 MG EC tablet [id number] No Take 1 tablet (81 mg total) by mouth daily. [name], MD Taking Active Self           Med Note Alesia Banda, Woodfin Ganja   Wed Mar 31, 2021  8:32 AM) Needs refill  Blood Glucose Monitoring Suppl (ACCU-CHEK GUIDE ME) w/Device KIT [id number] No 1 each by Does not apply route daily. Dx; Prediabetets; Prednisone use [name], MD Taking Active Self  budesonide (PULMICORT) 0.25 MG/2ML nebulizer solution [id number] No TAKE 1 VIAL (0.25 MG TOTAL) BY NEBULIZATION 2 (TWO) TIMES DAILY. Shawna Clamp, MD Taking Active   cloNIDine (CATAPRES) 0.2 MG tablet [id number] No Take 0.2 mg by mouth 2 (two) times daily. [provider] Taking Active Self  EPINEPHrine 0.3 mg/0.3 mL IJ SOAJ injection [id number] No Inject 0.3 mg into the muscle as needed for anaphylaxis. Parrett, Fonnie Mu, NP Taking Active Self  fluticasone (FLONASE) 50 MCG/ACT nasal spray [id number] No Place 1 spray into both nostrils daily as needed for allergies. Chesley Mires, MD Taking Active Self  fluticasone furoate-vilanterol (BREO ELLIPTA) 200-25 MCG/INH AEPB [id number] No Inhale 1 puff into the lungs daily. Parrett, Fonnie Mu, NP Taking Active Self  glucose blood (ACCU-CHEK GUIDE) test strip [id number] No Use as instructed daily before breakfast. Dx; Prediabetets; Prednisone use [name], MD Taking Active Self  metFORMIN (GLUCOPHAGE) 500 MG tablet [id number] No Take 500 mg by mouth 2 (two) times daily. [provider] Taking Active Self           Med Note Marcell Barlow   Wed Mar 31, 2021  8:35 AM)    Misc. Devices MISC [id number] No Blood pressure monitor  Dx: Hypertension [name], MD Taking Active Self  Misc. Devices [place] [id number] No Depends . Wt- 250 lbs, size 1X; Poise pads [name], MD Taking Active Self  Multiple Vitamin (MULTIVITAMIN WITH MINERALS) TABS tablet [id number] No Take 1 tablet by mouth daily. [provider] Taking Active Self            Patient Active Problem List   Diagnosis Date Noted   Diabetes mellitus without complication ([place]) 62/69/4854   OSA (obstructive sleep apnea) 01/20/2021   Essential hypertension 01/20/2021   Obesity (BMI 30-39.9) 01/20/2021   Acute asthma exacerbation 10/09/2019   COPD mixed type ([place]) 07/04/2019   Asthma 10/22/2017   Osteoarthritis of left knee 09/01/2016   Total knee replacement status 09/01/2016   Osteoarthritis of knees, 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   Primary osteoarthritis of left knee 03/09/2016   Anemia, iron deficiency 08/12/2015   Dysfunctional uterine bleeding 12/28/2014   Morbid obesity ([place]) 05/16/2014   Upper airway cough syndrome 05/01/2014   GERD (gastroesophageal reflux disease) 04/08/2014  OSA (obstructive sleep apnea) 03/20/2013    Conditions to be addressed/monitored: HTN, DM and Pulmonary Disease  Care Plan : Medication Management  Updates made by Lane Hacker, [place] since 03/01/2021 12:00 AM     Problem: Health Promotion or Disease Self-Management (General Plan of Care)      Goal: Medication Management   Note:   Current Barriers:  Unable to independently monitor therapeutic efficacy Does not adhere to prescribed medication regimen Does not maintain contact with provider office Does not contact provider office for questions/concerns   Pharmacist Clinical Goal(s):  Over the next 30 days, patient will achieve adherence to monitoring guidelines and medication adherence to achieve therapeutic efficacy contact provider office for questions/concerns as evidenced notation of same in electronic health record through collaboration with PharmD and provider.    Interventions: Inter-disciplinary care team collaboration (see longitudinal plan of care) Comprehensive medication  review performed; medication list updated in electronic medical record  _0 @ _1 @ _2 @ _3 @  Patient Goals/Self-Care Activities Over the next 30 days, patient will:  - take medications as prescribed check blood pressure Weekly, document, and provide at future appointments  Follow Up Plan: The care management team will reach out to the patient again over the next 30 days.      Task: Mutually Develop and Royce Macadamia Achievement of Patient Goals   Note:   Care Management Activities:    - verbalization of feelings encouraged    Notes:      Medication Assistance: None required. Patient affirms current coverage meets needs.   Follow up: Agree   Plan: The care management team will reach out to the patient again over the next 30 days.   [place] Constable, Pharm.D., Managed Medicaid Pharmacist - [phone]

## 2021-04-14 ENCOUNTER — Telehealth: Payer: Self-pay | Admitting: Family Medicine

## 2021-04-14 ENCOUNTER — Other Ambulatory Visit: Payer: Self-pay

## 2021-04-14 ENCOUNTER — Other Ambulatory Visit: Payer: Self-pay | Admitting: Obstetrics and Gynecology

## 2021-04-14 DIAGNOSIS — R739 Hyperglycemia, unspecified: Secondary | ICD-10-CM

## 2021-04-14 DIAGNOSIS — R7303 Prediabetes: Secondary | ICD-10-CM

## 2021-04-14 NOTE — Telephone Encounter (Signed)
Patient needs=ing referral to podiatry for diabetic shoes

## 2021-04-14 NOTE — Telephone Encounter (Signed)
Patient called to request an order for diabetic shoes.  Please call to confirm at [phone]

## 2021-04-14 NOTE — Patient Outreach (Signed)
Care Coordination  04/14/2021  Kathleen Rodriguez 02-Mar-1966 [id number]  RNCM returned patient's phone call.  Patient with questions regarding diabetic shoes.  Patient states she will call her PCP for prescription.  Patient states she needs to know where she can go to get diabetic shoes so insurance will pay for them.  RNCM placed referral to Care Guide to research where she may obtain shoes with insurance coverage.  Patient states she tried 2 different places and they would not accept her insurance, doesn't remember name and cannot afford to pay out of pocket.  [name] RN, BSN [place]  [employer], production - Managed Medicaid High Risk [phone].

## 2021-04-14 NOTE — Telephone Encounter (Signed)
Referral has been placed. 

## 2021-04-15 ENCOUNTER — Ambulatory Visit: Payer: Medicaid Other | Admitting: Orthopaedic Surgery

## 2021-04-15 ENCOUNTER — Telehealth: Payer: Self-pay | Admitting: Family Medicine

## 2021-04-15 NOTE — Telephone Encounter (Signed)
   Telephone encounter was:  Unsuccessful.  04/15/2021 Name: Kathleen Rodriguez MRN: [id number] DOB: 11/08/1965  Unsuccessful outbound call made today to assist with:   medical equipment, diabetic shoes  Outreach Attempt:  1st Attempt  A HIPAA compliant voice message was left requesting a return call.  Instructed patient to call back at [phone].  Rojelio Brenner Care Guide, Embedded Care Coordination [facility], Care Management Phone: [phone] Email: [email or web address]

## 2021-04-16 ENCOUNTER — Telehealth: Payer: Self-pay | Admitting: *Deleted

## 2021-04-16 NOTE — Telephone Encounter (Signed)
PA done today via CCM for the Breo 200.  This has been sent to the insurance to review.  Outcome will be sent in 24-72 hours.

## 2021-04-19 NOTE — Telephone Encounter (Signed)
Request Reference Number: [id number]. BREO ELLIPTA INH 200-25 is denied for not meeting the prior authorization requirement(s). For further questions, call [employer] at [phone] for more information. Appeals are not supported through ePA. Please refer to the fax case notice for appeals information and instructiions   Will call in the morning about this PA and find out what needs to be done or what meds are covered by the insurance.

## 2021-04-20 ENCOUNTER — Other Ambulatory Visit: Payer: Self-pay

## 2021-04-20 ENCOUNTER — Ambulatory Visit (INDEPENDENT_AMBULATORY_CARE_PROVIDER_SITE_OTHER): Payer: Medicaid Other | Admitting: Podiatrist

## 2021-04-20 ENCOUNTER — Encounter: Payer: Self-pay | Admitting: Podiatrist

## 2021-04-20 ENCOUNTER — Telehealth: Payer: Self-pay | Admitting: Family Medicine

## 2021-04-20 DIAGNOSIS — M76822 Posterior tibial tendinitis, left leg: Secondary | ICD-10-CM

## 2021-04-20 DIAGNOSIS — E0843 Diabetes mellitus due to underlying condition with diabetic autonomic (poly)neuropathy: Secondary | ICD-10-CM

## 2021-04-20 DIAGNOSIS — M2140 Flat foot [pes planus] (acquired), unspecified foot: Secondary | ICD-10-CM

## 2021-04-20 DIAGNOSIS — M7751 Other enthesopathy of right foot: Secondary | ICD-10-CM | POA: Diagnosis not present

## 2021-04-20 MED ORDER — TRAMADOL HCL 50 MG PO TABS: 50.0000 mg | ORAL_TABLET | Freq: Four times a day (QID) | ORAL | 0 refills | Status: AC | PRN

## 2021-04-20 MED ORDER — TRIAMCINOLONE ACETONIDE 10 MG/ML IJ SUSP
10.0000 mg | Freq: Once | INTRAMUSCULAR | Status: AC
  Administered 2021-04-20: 10 mg

## 2021-04-20 NOTE — Telephone Encounter (Signed)
Copied from CRM [id number]. Topic: Referral - Request for Referral >> Apr 20, 2021  9:24 AM Elliot Gault wrote: patient would like to be referred to dietician to discuss weight loss. PCP first available is not until August and patient can not wait that long, please follow up with afternoon call due to patient having doctor appointment in the morning  Please let me know if patient will need to be scheduled for an appointment

## 2021-04-20 NOTE — Progress Notes (Signed)
   Chief Complaint  Patient presents with   shoes    Pt states she is here today for DM shoes      HPI: Patient is 55 y.o. female who presents today for a couple of concerns.  1)  she would like diabetic shoes, 2)  she has a lump on the top of her right foot that is painful-  this is new and concerning 3)  her left foot is painful to walk.  She has tried a brace and it hasn't helped with her foot pain.  She has pain and a limp when she walks and it is getting increasingly worse.    Allergies  Allergen Reactions   Tomato Hives, Itching and Other (See Comments)    ALSO REACTS TO KETCHUP   Latex Itching and Rash   Latex Hives and Rash   Tomato Hives and Rash   Wool Alcohol [Lanolin] Itching    Physical Exam   GENERAL APPEARANCE: Alert, conversant. Appropriately groomed. No acute distress.   VASCULAR: Pedal pulses palpable DP and PT bilateral.  Capillary refill time is immediate to all digits,  Proximal to distal cooling it warm to warm.  Digital perfusion adequate.   NEUROLOGIC: sensation is diminished to 5.07 monofilament at 0/5 sites bilateral. Light touch and vibratory sensation are decreased.  Subjective numbess and tingling present.   MUSCULOSKELETAL: acceptable muscle strength, tone and stability bilateral. Pes planus foot type on the left is noted.  Pain along the midfoot and arch is noted. Heel is lateral in comparison with the leg.  Unable to perform a heel rise.   Some decrease in ankle joint rom is noted bilateral. Small dorsal spur is palpated dorsally at the proximal midfoot right foot.    DERMATOLOGIC: skin is warm, supple, and dry.  Bilateral lateral heels have significant improvement in the deep fissures and cracking that she was experiencing at the last visit. .  No sign of infection noted.  No redness or streaking noted.      Assessment     ICD-10-CM   1. Diabetes mellitus due to underlying condition with diabetic autonomic neuropathy, unspecified whether long term  insulin use (HCC)  E08.43     2. Posterior tibial tendon dysfunction, left  M76.822     3. Pes planus, unspecified laterality  M21.40     4. Bone spur of right foot  M77.51        Plan  1)  prescription for diabetic shoes written for [employer] and prosthetics in [place]-  I will fax it today along with chart notes. 2)  recommended an injection for the top of the right foot to help with the inflammation.  She agreed and after a sterile prep was performed an injection of 10mg  kenalog and lidocaine plain was infiltrated into the area of tenderness.  She did well with the injection. 3)  I recommended she see Dr. for her left foot for his recommendations.  I discussed she may be a candidate for a possible brace and potentially surgery but will have to see what he recommends based on his exam.   4)  Xrays will likely be needed at that visit* xrays in chart are non weightbearing views.

## 2021-04-20 NOTE — Patient Instructions (Signed)
I will send you to [facility] in [place] for diabetic shoes and inserts  Dr. Ardelle Anton will see you for your right foot to check and see what he recommends to help the pain

## 2021-04-21 NOTE — Telephone Encounter (Signed)
Called [employer] [place] for the reason why the Virgel Bouquet was denied.  They stated that they pt has to try and fail and not be able to take either Name brand symbicort or name brand advair.  Pt has taken symbicort back in 2014.   TP please advise. Thanks

## 2021-04-22 NOTE — Telephone Encounter (Signed)
Done

## 2021-04-22 NOTE — Telephone Encounter (Signed)
Will route to PCP for review. 

## 2021-04-23 ENCOUNTER — Telehealth: Payer: Self-pay | Admitting: Family Medicine

## 2021-04-23 NOTE — Telephone Encounter (Signed)
Copied from CRM [id number]. Topic: General - Inquiry >> Apr 22, 2021  5:14 PM Daphine Deutscher D wrote: Reason for CRM: [place] with [employer] wanting to know if the office has received a fax regarding the pt's incontinence supplies.  IF so they are just needing this sighed off by the provider  CB# [phone]

## 2021-04-27 ENCOUNTER — Other Ambulatory Visit (HOSPITAL_COMMUNITY): Payer: Self-pay

## 2021-04-27 MED ORDER — ADVAIR HFA 230-21 MCG/ACT IN AERO: 2.0000 | INHALATION_SPRAY | Freq: Two times a day (BID) | RESPIRATORY_TRACT | 6 refills | Status: DC

## 2021-04-27 NOTE — Telephone Encounter (Signed)
It doesn't look like she tried advair before.  Please send script for advair HFA [id number] two puffs bid.

## 2021-04-27 NOTE — Telephone Encounter (Signed)
Pt has been contacted by nutritionist regarding setting an appointment.

## 2021-04-27 NOTE — Telephone Encounter (Signed)
Please let patient know they will not cover BREO can change to Symbicort 160 2 puffs Twice daily    Is she taking budesonide nebs if so ? Will need to stop if she begins Symbicort .

## 2021-04-27 NOTE — Telephone Encounter (Signed)
Called and spoke with patient. She verbalized understanding. RX for Advair has been placed.   Nothing further needed at time of call.

## 2021-04-27 NOTE — Telephone Encounter (Signed)
Spoke with pt who states both Breo and Epi Pen are not being covered by her insurance. Pt advised of Tammy's recommendation of changing to Symbicort but pt states that she does not like to use the Symbicort r/t "buring my throat" . Dr. Craige Cotta could you please advise.

## 2021-04-28 NOTE — Telephone Encounter (Signed)
   Telephone encounter was:  Unsuccessful.  04/28/2021 Name: RENETTE HSU MRN: [id number] DOB: 02-May-1966  Unsuccessful outbound call made today to assist with:  Financial Difficulties related to medical shoes  Outreach Attempt:  2nd Attempt  A HIPAA compliant voice message was left requesting a return call.  Instructed patient to call back at [phone].  Rojelio Brenner Care Guide, Embedded Care Coordination [facility], Care Management Phone: [phone] Email: [email or web address]

## 2021-04-29 ENCOUNTER — Telehealth: Payer: Self-pay | Admitting: Family Medicine

## 2021-04-29 ENCOUNTER — Telehealth: Payer: Self-pay | Admitting: Pulmonary Disease

## 2021-04-29 MED ORDER — PREDNISONE 10 MG PO TABS: ORAL_TABLET | ORAL | 0 refills | Status: AC

## 2021-04-29 NOTE — Telephone Encounter (Signed)
   Telephone encounter was:  Unsuccessful.  04/29/2021 Name: Kathleen Rodriguez MRN: [id number] DOB: 1966/06/18  Unsuccessful outbound call made today to assist with:  Financial Difficulties related to diabetic shoes  Outreach Attempt:  3rd Attempt.  Referral closed unable to contact patient.  A HIPAA compliant voice message was left requesting a return call.  Instructed patient to call back at [phone].  Rojelio Brenner Care Guide, Embedded Care Coordination [facility], Care Management Phone: [phone] Email: [email or web address]

## 2021-04-29 NOTE — Telephone Encounter (Signed)
Dr Craige Cotta patient asking for prednisone for asthma flare.  Order prednisone 10 mg, # 20, 4 X 2 DAYS, 3 X 2 DAYS, 2 X 2 DAYS, 1 X 2 DAYS  Please help her get ov with Dr Craige Cotta or APP in next 1-2 weeks.

## 2021-04-29 NOTE — Telephone Encounter (Signed)
Spoke with pt who states increased SOB x 2 days. Pt denies N/V/D/F/C. Pt states took home Covid test and it was negative. Pt reports using Breo daily and using albuterol 3 times over the last 3 days. [facility] [address] Dr. Maple Hudson please advise.

## 2021-04-29 NOTE — Telephone Encounter (Signed)
Call returned to patient, confirmed DOB. Confirmed Medication and pharmacy. Medication sent. Voiced understanding.   Nothing further needed at this time.

## 2021-05-04 ENCOUNTER — Telehealth: Payer: Self-pay | Admitting: Family Medicine

## 2021-05-04 ENCOUNTER — Telehealth: Payer: Self-pay | Admitting: Pulmonary Disease

## 2021-05-04 ENCOUNTER — Telehealth: Payer: Self-pay | Admitting: Podiatrist

## 2021-05-04 DIAGNOSIS — R7303 Prediabetes: Secondary | ICD-10-CM

## 2021-05-04 MED ORDER — ACCU-CHEK SOFTCLIX LANCETS MISC: 2 refills | Status: DC

## 2021-05-04 MED ORDER — ACCU-CHEK GUIDE ME W/DEVICE KIT: PACK | 0 refills | Status: AC

## 2021-05-04 MED ORDER — ACCU-CHEK GUIDE VI STRP: ORAL_STRIP | 2 refills | Status: DC

## 2021-05-04 NOTE — Telephone Encounter (Signed)
   Telephone encounter was:  Unsuccessful.  05/04/2021 Name: Kathleen Rodriguez MRN: [id number] DOB: 10-10-1966  Unsuccessful outbound call made today to assist with:   needing medical diabetic shoes  Outreach Attempt:  3rd Attempt.  Referral closed unable to contact patient.  A HIPAA compliant voice message was left requesting a return call.  Instructed patient to call back at [phone]  [facility] Guide, Embedded Care Coordination [facility], Care Management Phone: [phone] Email: [email or web address]

## 2021-05-04 NOTE — Telephone Encounter (Signed)
Medication Refill - Medication: diflucan and blood monitoring machine(says doesn't read correctly)  Has the patient contacted their pharmacy? yes (Agent: If no, request that the patient contact the pharmacy for the refill.) (Agent: If yes, when and what did the pharmacy advise?)contact pap  Preferred Pharmacy (with phone number or street name):   Agent: Please be advised that RX refills may take up to 3 business days. We ask that you follow-up with your pharmacy.

## 2021-05-04 NOTE — Telephone Encounter (Signed)
I sent the glucose testing supplies. I cannot refill Diflucan. Either her PCP will have to send it or she will need to be seen.

## 2021-05-04 NOTE — Telephone Encounter (Signed)
Called and spoke with patient. She stated that she has been having a hard time getting [facility] [address] She has been trying to get the Epipen for the past 3 weeks but they keep telling her that it is out of stock.    [facility]. She stated that Medicaid requires that she uses a certain brand that [facility] not [facility]. They have been trying to order it for the past few weeks and have not received it. They suggested sending the RX to either [facility] or [facility].   Called patient to see if she would like for [place] to send the RX to another pharmacy but she did not answer. Left message for her to call back.

## 2021-05-04 NOTE — Telephone Encounter (Signed)
Pt stated that she is trying to get advice on where to go to get an Epipen stated that [facility] on [address] does not have any.  Pls regard; [phone]

## 2021-05-04 NOTE — Telephone Encounter (Signed)
Patient is requesting a refill on pain medication. Patient would also like to be scheduled for surgery. Sent to on call provider since Dr. Irving Shows in not in the office at this time. Please advise.

## 2021-05-04 NOTE — Telephone Encounter (Signed)
Refill not indicated at this time. We can get her to come in for an appointment for eval and she can discuss with a provider. Per Dr. Faylene Million note she discussed patient see Dr. Ardelle Anton - can we get her set up for an appointment?

## 2021-05-05 MED ORDER — FLUCONAZOLE 150 MG PO TABS: 150.0000 mg | ORAL_TABLET | Freq: Once | ORAL | 0 refills | Status: AC

## 2021-05-05 NOTE — Addendum Note (Signed)
Addended by: Jonah Blue B on: 05/05/2021 05:41 PM   Modules accepted: Orders

## 2021-05-05 NOTE — Telephone Encounter (Signed)
Rxn for Diflucan sent.

## 2021-05-05 NOTE — Telephone Encounter (Signed)
Will get patient scheduled.

## 2021-05-06 MED ORDER — EPINEPHRINE 0.3 MG/0.3ML IJ SOAJ: 0.3000 mg | INTRAMUSCULAR | 2 refills | Status: DC | PRN

## 2021-05-06 NOTE — Telephone Encounter (Signed)
Called and spoke to pt. Informed pt of the information per Cherina. Rx has been sent to [facility] on [address]. Pt verbalized understanding and denied any further questions or concerns at this time.

## 2021-05-10 ENCOUNTER — Telehealth: Payer: Self-pay

## 2021-05-10 MED ORDER — FLUCONAZOLE 150 MG PO TABS: 150.0000 mg | ORAL_TABLET | Freq: Once | ORAL | 1 refills | Status: AC

## 2021-05-10 NOTE — Telephone Encounter (Signed)
Patient is experiencing personal discomfort as a result of being prescribed Prednisone     The patient is experiencing itching and general discomfort    The patient would like to be prescribed something to help with the discomfort     Please contact further

## 2021-05-10 NOTE — Telephone Encounter (Signed)
Prescription for Diflucan sent to pharmacy 

## 2021-05-11 ENCOUNTER — Telehealth: Payer: Self-pay | Admitting: Pharmacist

## 2021-05-11 NOTE — Telephone Encounter (Signed)
Pt has been informed of medication being sent to pharmacy. 

## 2021-05-12 ENCOUNTER — Other Ambulatory Visit: Payer: Self-pay | Admitting: Obstetrics and Gynecology

## 2021-05-12 ENCOUNTER — Other Ambulatory Visit (HOSPITAL_COMMUNITY): Payer: Self-pay

## 2021-05-12 ENCOUNTER — Other Ambulatory Visit: Payer: Self-pay

## 2021-05-12 NOTE — Telephone Encounter (Signed)
Submitted a Prior Authorization request to [facility] for [facility] via CoverMyMeds. Will update once we receive a response.   Key: [id number]

## 2021-05-12 NOTE — Patient Outreach (Signed)
Care Coordination  05/12/2021  KEYLEEN CERRATO 11/16/1965 [id number]  RNCM called patient at scheduled time.  Patient stated she was not feeling well and asked to be called another day.  RNCM rescheduled appointment at patient's request.  [name] RN, BSN [place]  [facility] Care Management Coordinator - Managed [place] High Risk [phone].

## 2021-05-12 NOTE — Telephone Encounter (Signed)
Patient scheduled for Harrington Challenger new start on 05/18/21 @ 1:20pm.  Harrington Challenger pen is on-site in [place] fridge from previous appointment.  Will re-initiate Harrington Challenger prior Berkley Harvey though [facility] Medicaid since auth expired at the beginning of July 2022. Routing to pharmacy pool for assistance  Chesley Mires, PharmD, MPH, BCPS Clinical Pharmacist (Rheumatology and Pulmonology)

## 2021-05-13 ENCOUNTER — Ambulatory Visit: Payer: Medicaid Other | Admitting: Podiatry

## 2021-05-13 NOTE — Telephone Encounter (Signed)
Received a fax regarding Prior Authorization from [facility] for [facility]. Authorization has been DENIED because:  Per your health plan's criteria, this drug is covered if you meet the following: (1) Your doctor tells [place] that this drug is working for your condition (continued clinical benefit as evidenced by reductions in asthma exacerbations from baseline supported by your current asthma status and response to treatment).  PA#: [id number]  Per denial letter, we have 60 days to submit an appeal. An appeal request form is attached to denial letter.  Appeal Phone# [phone] Appeal Fax# [phone]

## 2021-05-13 NOTE — Telephone Encounter (Signed)
Called and spoke with representative at [facility], they are working to [employer] new PA request for [employer] as Initial Therapy. Submission was originally canceled out as it was flagged as a duplicate by their system. Rep stated she would mark it as NOT a duplicate and expedite the request.

## 2021-05-14 ENCOUNTER — Telehealth: Payer: Self-pay

## 2021-05-14 ENCOUNTER — Other Ambulatory Visit (HOSPITAL_COMMUNITY): Payer: Self-pay

## 2021-05-14 NOTE — Telephone Encounter (Signed)
Received fax from [facility] stating that request for initial [facility] therapy has been approved from 05/13/2021 through 11/13/2021.  Authorization# [id number]   Attempted to run test claim, but PA has not yet adjudicated at pharmacy level. Will continue to f/u.

## 2021-05-14 NOTE — Telephone Encounter (Signed)
Letter was mailed to patient.

## 2021-05-14 NOTE — Telephone Encounter (Signed)
Letter will be mailed to patient.

## 2021-05-18 ENCOUNTER — Ambulatory Visit: Payer: Medicaid Other | Admitting: Podiatry

## 2021-05-18 ENCOUNTER — Other Ambulatory Visit: Payer: Medicaid Other | Admitting: Pharmacist

## 2021-05-18 ENCOUNTER — Other Ambulatory Visit (HOSPITAL_COMMUNITY): Payer: Self-pay

## 2021-05-18 NOTE — Telephone Encounter (Signed)
Patient was no show to [place] new start appointment today. Will close encounter. This is 3rd no show.  Fasenra pen is in [place] in infusion center if patient reaches out and requests Harrington Challenger start visit again.  Chesley Mires, PharmD, MPH, BCPS Clinical Pharmacist (Rheumatology and Pulmonology)

## 2021-05-18 NOTE — Telephone Encounter (Signed)
Override has been put in place and ready for Rx to be sent in today. Will await patient arrival at clinic for new start appointment.

## 2021-05-24 ENCOUNTER — Telehealth (INDEPENDENT_AMBULATORY_CARE_PROVIDER_SITE_OTHER): Payer: Self-pay

## 2021-05-24 NOTE — Telephone Encounter (Signed)
After speaking with clinical pharmacist, contacted patient to notify that currently our office does not offer the monkeypox vaccine and the CDC controls who does and does not receive the vaccine at this time. Asked to return call to office with any further questions or concerns. Maryjean Morn, CMA    Copied from CRM [id number]. Topic: General - Other >> May 24, 2021  2:21 PM Gaetana Michaelis A wrote: Reason for CRM: Patient would like to be contacted by a member of staff regarding the monkeypox vaccine   Please contact further when possible

## 2021-05-25 ENCOUNTER — Other Ambulatory Visit: Payer: Self-pay | Admitting: Family Medicine

## 2021-05-25 DIAGNOSIS — R7303 Prediabetes: Secondary | ICD-10-CM

## 2021-05-26 ENCOUNTER — Ambulatory Visit: Payer: Medicaid Other | Admitting: Physician Assistant

## 2021-05-28 DIAGNOSIS — E119 Type 2 diabetes mellitus without complications: Secondary | ICD-10-CM | POA: Diagnosis not present

## 2021-05-28 DIAGNOSIS — R32 Unspecified urinary incontinence: Secondary | ICD-10-CM | POA: Diagnosis not present

## 2021-06-01 ENCOUNTER — Other Ambulatory Visit: Payer: Self-pay

## 2021-06-01 ENCOUNTER — Other Ambulatory Visit: Payer: Self-pay | Admitting: Obstetrics and Gynecology

## 2021-06-01 DIAGNOSIS — R739 Hyperglycemia, unspecified: Secondary | ICD-10-CM

## 2021-06-01 NOTE — Patient Instructions (Signed)
Hi Kathleen Rodriguez, thank you for speaking with me today.  Kathleen Rodriguez was given information about Medicaid Managed Care team care coordination services as a part of their [place] Medicaid benefit. Kathleen Rodriguez verbally consented to engagement with the [facility] Managed Care team.   If you are experiencing a medical emergency, please call 911 or report to your local emergency department or urgent care.   If you have a non-emergency medical problem during routine business hours, please contact your provider's office and ask to speak with a nurse.   For questions related to your [facility], please call: [phone] or visit the homepage here: [email or web address]  If you would like to schedule transportation through your [facility], please call the following number at least 2 days in advance of your appointment: [phone].   Call the [place] at [phone], at any time, 24 hours a day, 7 days a week. If you are in danger or need immediate medical attention call 911.  If you would like help to quit smoking, call 1-800-QUIT-NOW 7690218853) OR Espaol: 1-855-Djelo-Ya (2-878-676-7209) o para ms informacin haga clic aqu or Text READY to 200-400 to register via text  Kathleen Rodriguez - following are the goals we discussed in your visit today:   Goals Addressed             This Visit's Progress    Protect My Health       Timeframe:  Long-Range Goal Priority:  High Start Date:        02/24/21                     Expected End Date:       ongoing            Follow Up Date: 07/02/21   - schedule appointment for flu shot - schedule appointment for vaccines needed due to my age or health - schedule recommended health tests (blood work, mammogram, colonoscopy, pap test) - schedule and keep appointment for annual check-up   Update 04/12/21:  Patient  attending all appointments. Update 06/01/21:  Patient has appt 06/21/21 with [facility] [employer] about diabetic shoes and 06/22/21 with dietican.   Why is this important?   Screening tests can find diseases early when they are easier to treat.  Your doctor or nurse will talk with you about which tests are important for you.  Getting shots for common diseases like the flu and shingles will help prevent them.            Patient verbalizes understanding of instructions provided today.   The Managed Medicaid care management team will reach out to the patient again over the next 30 days.  The  Patient                                              has been provided with contact information for the Managed Medicaid care management team and has been advised to call with any health related questions or concerns.   Kathleen Raider RN, BSN [place]  [facility] Curator - Managed Medicaid High Risk [phone].   Following is a copy of your plan of care:  Patient Care Plan: General Plan of Care (Adult)   Problem Identified: Health Promotion or Disease Self-Management (General  Plan of Care)   Priority: High  Onset Date: 02/24/2021     Long-Range Goal: Self-Management Plan Developed   Start Date: 02/24/2021  Expected End Date: 09/01/2021  Recent Progress: Not on track  Priority: High  Note:   Current Barriers:  Ineffective Self Health Maintenance-patient not checking blood sugars-needs to pick new glucometer up at pharmacy.  Also, checking blood pressure irregularly. Patient states she is struggling to pay for rent and medicines. Update 04/12/21:  Patient states she is checking blood sugar twice a day and checking blood pressure daily.  Patient complains of knee pain today. Update 06/01/21:  Patient states she is not checking blood sugars or blood pressure. Transportation issues, patient utilized [facility] transportation and was told no available drivers. Update 04/12/21:   Patient has [place] as a resource as well. Currently UNABLE TO independently self manage needs related to chronic health conditions.  Knowledge Deficits related to short term plan for care coordination needs and long term plans for chronic disease management needs Nurse Case Manager Clinical Goal(s):  patient will work with care management team to address care coordination and chronic disease management needs related to Disease Management Educational Needs Care Coordination Medication Management and Education Medication Reconciliation Medication Assistance  Psychosocial Support   Interventions:  Evaluation of current treatment plan and patient's adherence to plan as established by provider. Reviewed medications with patient. Collaborated with pharmacy regarding medications. Discussed plans with patient for ongoing care management follow up and provided patient with direct contact information for care management team Advised patient, providing education and rationale, to monitor blood pressure daily and record, calling provider for findings outside established parameters.  Reviewed scheduled/upcoming provider appointments. Update 04/12/21:  Patient to schedule appointment with Dr. Erlinda Hong for evaluation of knee pain. Update 06/01/21:  Patient has not scheduled a follow up appointment yet. Advised patient, providing education and rationale, to check cbg and record, calling provider for findings outside established parameters.   Care guide referral for financial resources for rent, medicines. Care Guide referral for transportation. Update 04/12/21:  RNCM provided transportation resources to patient. Pharmacy referral for medication review. Update 04/12/21:  Patient has met with Pharmacist and continues to follow. Self Care Activities:  Patient will self administer medications as prescribed Patient will attend all scheduled provider appointments Patient will call pharmacy for  medication refills Patient will continue to perform ADL's independently Patient will call provider office for new concerns or questions Patient Goals: In the next 30 days, patient will check blood pressure and blood sugars as directed by provider. In the next 30 days, patient will attend all scheduled appointments. In the next 30 days, patient will meet with Pharmacist to discuss medications.- Follow Up Plan: The patient has been provided with contact information for the care management team and has been advised to call with any health related questions or concerns.  The care management team will reach out to the patient again over the next 30 days.     Evidence-based guidance:   Review biopsychosocial determinants of health screens.  Review need for preventive screening based on age, sex, family history and health history.  Determine level of modifiable health risk. .  Discuss identified risks.  Identify areas where behavior change may lead to improved health.  Promote healthy lifestyle.  Evoke change talk using open-ended questions, pros and cons, as well as looking forward.  Identify and manage conditions or preconditions to reduce health risk.  Implement additional goals and interventions based on identified  risk factors.

## 2021-06-01 NOTE — Patient Outreach (Signed)
Medicaid Managed Care   Nurse Care Manager Note  06/01/2021 Name:  Kathleen Rodriguez MRN:  [id number] DOB:  Nov 19, 1965  Kathleen Rodriguez is an 55 y.o. year old female who is a primary patient of [name], MD.  The [facility] Managed Care Coordination team was consulted for assistance with:    Chronic healthcare management needs.  [name] was given information about Medicaid Managed Care Coordination team services today. Kathleen Rodriguez Patient agreed to services and verbal consent obtained.  Engaged with patient by telephone for follow up visit in response to provider referral for case management and/or care coordination services.   Assessments/Interventions:  Review of past medical history, allergies, medications, health status, including review of consultants reports, laboratory and other test data, was performed as part of comprehensive evaluation and provision of chronic care management services.  SDOH (Social Determinants of Health) assessments and interventions performed: SDOH Interventions    Flowsheet Row Most Recent Value  SDOH Interventions   Food Insecurity Interventions Intervention Not Indicated  Financial Strain Interventions [id number] Referral  Housing Interventions [id number] Referral  Intimate Partner Violence Interventions Intervention Not Indicated  Physical Activity Interventions Other (Comments)  [patient with knee pain-cannot do that.]  Stress Interventions Other (Comment)  Social Connections Interventions Intervention Not Indicated       Care Plan  Allergies  Allergen Reactions   Tomato Hives, Itching and Other (See Comments)    ALSO REACTS TO KETCHUP   Latex Itching and Rash   Latex Hives and Rash   Tomato Hives and Rash   Wool Alcohol [Lanolin] Itching    Medications Reviewed Today     Reviewed by [name], RN (Registered Nurse) on 06/01/21 at 1352  Med List Status: <None>   Medication Order Taking? Sig Documenting Provider Last Dose Status  Informant  Accu-Chek Softclix Lancets lancets [id number]  Use to check blood sugar once daily. [name], MD  Active   acetaminophen (TYLENOL) 500 MG tablet [id number] Yes Take 500 mg by mouth every 6 (six) hours as needed for mild pain. [provider] Taking Active Self  albuterol (PROAIR HFA) 108 (90 Base) MCG/ACT inhaler [id number] Yes TAKE 2 PUFFS BY MOUTH EVERY 6 HOURS AS NEEDED FOR WHEEZE OR SHORTNESS OF Cipriano Mile, MD Taking Active   albuterol (PROVENTIL) (2.5 MG/3ML) 0.083% nebulizer solution [id number] Yes Take 3 mLs (2.5 mg total) by nebulization every 6 (six) hours as needed for shortness of breath or wheezing. Shawna Clamp, MD Taking Active   aspirin 81 MG EC tablet [id number] No Take 1 tablet (81 mg total) by mouth daily.  Patient not taking: Reported on 06/01/2021   [name], MD Not Taking Active Self           Med Note Marcell Barlow   Wed Mar 31, 2021  8:32 AM) Needs refill  Blood Glucose Monitoring Suppl (ACCU-CHEK GUIDE ME) w/Device KIT [id number] Yes Use to check blood sugar once daily. [name], MD Taking Active   budesonide (PULMICORT) 0.25 MG/2ML nebulizer solution [id number]  TAKE 1 VIAL (0.25 MG TOTAL) BY NEBULIZATION 2 (TWO) TIMES DAILY. Shawna Clamp, MD  Expired 05/02/21 2359   cloNIDine (CATAPRES) 0.2 MG tablet [id number] No Take 0.2 mg by mouth 2 (two) times daily.  Patient not taking: Reported on 06/01/2021   [provider] Not Taking Active Self  EPINEPHrine 0.3 mg/0.3 mL IJ SOAJ injection [id number]  Inject 0.3 mg into the muscle as needed for anaphylaxis. Chesley Mires, MD  Active   fluticasone (FLONASE) 50 MCG/ACT nasal spray [id number] No Place 1 spray into both nostrils daily as needed for allergies.  Patient not taking: Reported on 06/01/2021   Chesley Mires, MD Not Taking Active Self  fluticasone-salmeterol (ADVAIR [facility]) 230-21 MCG/ACT inhaler [id number] Yes Inhale 2 puffs into the lungs 2 (two) times daily. Chesley Mires,  MD Taking Active   glucose blood (ACCU-CHEK GUIDE) test strip [id number]  Use as instructed daily before breakfast. [name], MD  Active   metFORMIN (GLUCOPHAGE) 500 MG tablet [id number] Yes TAKE 1 TABLET BY MOUTH 2 TIMES DAILY WITH A MEAL. [name], MD Taking Active   methocarbamol (ROBAXIN) 500 MG tablet [id number] No TAKE 1 TABLET (500 MG TOTAL) BY MOUTH EVERY 8 (EIGHT) HOURS AS NEEDED FOR MUSCLE SPASMS.  Patient not taking: Reported on 06/01/2021   Elsie Stain, MD Not Taking Active   Misc. Devices MISC [id number]  Blood pressure monitor  Dx: Hypertension [name], MD  Active Self  Misc. Devices [place] [id number]  Depends . Wt- 250 lbs, size 1X; Poise pads [name], MD  Active Self  Multiple Vitamin (MULTIVITAMIN WITH MINERALS) TABS tablet [id number] No Take 1 tablet by mouth daily.  Patient not taking: Reported on 06/01/2021   [provider] Not Taking Active Self            Patient Active Problem List   Diagnosis Date Noted   Diabetes mellitus without complication ([place]) 50/56/9794   OSA (obstructive sleep apnea) 01/20/2021   Essential hypertension 01/20/2021   Obesity (BMI 30-39.9) 01/20/2021   Acute asthma exacerbation 10/09/2019   COPD mixed type ([place]) 07/04/2019   Asthma 10/22/2017   Osteoarthritis of left knee 09/01/2016   Total knee replacement status 09/01/2016   Osteoarthritis of knees, 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   Primary osteoarthritis of left knee 03/09/2016   Anemia, iron deficiency 08/12/2015   Dysfunctional uterine bleeding 12/28/2014   Morbid obesity ([place]) 05/16/2014   Upper airway cough syndrome 05/01/2014   GERD (gastroesophageal reflux disease) 04/08/2014   OSA (obstructive sleep apnea) 03/20/2013    Conditions to be addressed/monitored per PCP order:   chronic healthcare management needs, asthma, HTN, GERD, COPD, DM, osteoarthritis,  OSA.  Care Plan : General Plan of Care (Adult)  Updates made by [name], RN since  06/01/2021 12:00 AM     Problem: Health Promotion or Disease Self-Management (General Plan of Care)   Priority: High  Onset Date: 02/24/2021     Long-Range Goal: Self-Management Plan Developed   Start Date: 02/24/2021  Expected End Date: 09/01/2021  Recent Progress: Not on track  Priority: High  Note:   Current Barriers:  Ineffective Self Health Maintenance-patient not checking blood sugars-needs to pick new glucometer up at pharmacy.  Also, checking blood pressure irregularly. Patient states she is struggling to pay for rent and medicines. Update 04/12/21:  Patient states she is checking blood sugar twice a day and checking blood pressure daily.  Patient complains of knee pain today. Update 06/01/21:  Patient states she is not checking blood sugars or blood pressure. Transportation issues, patient utilized [facility] transportation and was told no available drivers. Update 04/12/21:  Patient has [place] as a resource as well. Currently UNABLE TO independently self manage needs related to chronic health conditions.  Knowledge Deficits related to short term plan for care coordination needs and long term plans for chronic disease management needs Nurse Case Manager Clinical Goal(s):  patient will work with care management team to  address care coordination and chronic disease management needs related to Disease Management Educational Needs Care Coordination Medication Management and Education Medication Reconciliation Medication Assistance  Psychosocial Support   Interventions:  Evaluation of current treatment plan and patient's adherence to plan as established by provider. Reviewed medications with patient. Collaborated with pharmacy regarding medications. Discussed plans with patient for ongoing care management follow up and provided patient with direct contact information for care management team Advised patient, providing education and rationale, to monitor blood pressure daily  and record, calling provider for findings outside established parameters.  Reviewed scheduled/upcoming provider appointments. Update 04/12/21:  Patient to schedule appointment with Dr. Erlinda Hong for evaluation of knee pain. Update 06/01/21:  Patient has not scheduled a follow up appointment yet. Advised patient, providing education and rationale, to check cbg and record, calling provider for findings outside established parameters.   Care guide referral for financial resources for rent, medicines. Care Guide referral for transportation. Update 04/12/21:  RNCM provided transportation resources to patient. Pharmacy referral for medication review. Update 04/12/21:  Patient has met with Pharmacist and continues to follow. Self Care Activities:  Patient will self administer medications as prescribed Patient will attend all scheduled provider appointments Patient will call pharmacy for medication refills Patient will continue to perform ADL's independently Patient will call provider office for new concerns or questions Patient Goals: In the next 30 days, patient will check blood pressure and blood sugars as directed by provider. In the next 30 days, patient will attend all scheduled appointments. In the next 30 days, patient will meet with Pharmacist to discuss medications.- Follow Up Plan: The patient has been provided with contact information for the care management team and has been advised to call with any health related questions or concerns.  The care management team will reach out to the patient again over the next 30 days.     Evidence-based guidance:   Review biopsychosocial determinants of health screens.  Review need for preventive screening based on age, sex, family history and health history.  Determine level of modifiable health risk. .  Discuss identified risks.  Identify areas where behavior change may lead to improved health.  Promote healthy lifestyle.  Evoke change talk using  open-ended questions, pros and cons, as well as looking forward.  Identify and manage conditions or preconditions to reduce health risk.  Implement additional goals and interventions based on identified risk factors.      Follow Up:  Patient agrees to Care Plan and Follow-up.  Plan: The Managed Medicaid care management team will reach out to the patient again over the next 30 days. and The patient has been provided with contact information for the Managed Medicaid care management team and has been advised to call with any health related questions or concerns.  Date/time of next scheduled RN care management/care coordination outreach:  07/02/21 at 1245.

## 2021-06-02 ENCOUNTER — Telehealth: Payer: Self-pay | Admitting: Family Medicine

## 2021-06-02 NOTE — Telephone Encounter (Signed)
   Telephone encounter was:  Successful.  06/02/2021 Name: ATLEY SCARBORO MRN: [id number] DOB: Jan 27, 1966  [name] is a 55 y.o. year old female who is a primary care patient of Hoy Register, MD . The community resource team was consulted for assistance with Financial Difficulties related to paying rent/utilities and medication copays.  Care guide performed the following interventions: Patient provided with information about care guide support team and interviewed to confirm resource needs  She is going to be scheduled with a pharmacist for assistance with medications. I am mailing information on local food banks and financial resources.  Follow Up Plan:  No further follow up planned at this time. The patient has been provided with needed resources.  April Green Care Guide, Embedded Care Coordination [facility], Care Management Phone: [phone] Email: [email or web address]

## 2021-06-02 NOTE — Telephone Encounter (Signed)
Copied from CRM [id number]. Topic: General - Other >> May 28, 2021  3:59 PM Marylen Ponto wrote: Reason for CRM: Pt stated Dr. Alvis Lemmings rejected request for diabetic shoes and she is does not know why because she is on Metformin 500 MG. Pt stated she was diagnosed as being diabetic 2 so she really needs Dr. Alvis Lemmings to approve the order for her diabetic shoes. Pt requests call back asap >> May 31, 2021 10:57 AM [place], Maxine Glenn A wrote: Pt requested a call back regarding previous messages left as well as the reason why the order for her diabetic shoes was denied, please advise.

## 2021-06-03 NOTE — Telephone Encounter (Signed)
She is on metformin which can be used to treat prediabetes as well.  Nothing in my notes indicates a diagnosis of type 2 diabetes mellitus and the form requires a diagnosis of type 2 diabetes mellitus and documentation by the physician for managing the patient for diabetes which unfortunately I have not been doing and it would be false to claim that I have.  Please offer her an in person visit to discuss further if she would like.

## 2021-06-04 NOTE — Telephone Encounter (Signed)
Called pt made aware . Stated will discuss  further if needed on upcoming appt

## 2021-06-07 ENCOUNTER — Other Ambulatory Visit: Payer: Self-pay

## 2021-06-07 NOTE — Patient Instructions (Signed)
Visit Information  Ms. Hank was given information about Medicaid Managed Care team care coordination services as a part of their [place] Medicaid benefit. [name] verbally consented to engagement with the [facility] Managed Care team.   If you are experiencing a medical emergency, please call 911 or report to your local emergency department or urgent care.   If you have a non-emergency medical problem during routine business hours, please contact your provider's office and ask to speak with a nurse.   For questions related to your [facility], please call: [phone] or visit the homepage here: [email or web address]  If you would like to schedule transportation through your [facility], please call the following number at least 2 days in advance of your appointment: [phone].   Call the [place] at [phone], at any time, 24 hours a day, 7 days a week. If you are in danger or need immediate medical attention call 911.  If you would like help to quit smoking, call 1-800-QUIT-NOW 805-525-6535) OR Espaol: 1-855-Djelo-Ya (3-545-625-6389) o para ms informacin haga clic aqu or Text READY to 200-400 to register via text  Ms. Ronnald Ramp - following are the goals we discussed in your visit today:   Goals Addressed   None     Please see education materials related to DM provided as print materials.   Patient verbalizes understanding of instructions provided today.   The Managed Medicaid care management team will reach out to the patient again over the next 60 days.   [place] Constable, Pharm.D., Managed Medicaid Pharmacist [phone]   Following is a copy of your plan of care:  Patient Care Plan: General Plan of Care (Adult)     Problem Identified: Health Promotion or Disease Self-Management (General Plan of Care)   Priority:  High  Onset Date: 02/24/2021     Problem Identified: Health Promotion or Disease Self-Management (General Plan of Care)   Priority: High  Onset Date: 02/24/2021     Long-Range Goal: Self-Management Plan Developed   Start Date: 02/24/2021  Expected End Date: 09/01/2021  Recent Progress: Not on track  Priority: High  Note:   Current Barriers:  Ineffective Self Health Maintenance-patient not checking blood sugars-needs to pick new glucometer up at pharmacy.  Also, checking blood pressure irregularly. Patient states she is struggling to pay for rent and medicines. Update 04/12/21:  Patient states she is checking blood sugar twice a day and checking blood pressure daily.  Patient complains of knee pain today. Update 06/01/21:  Patient states she is not checking blood sugars or blood pressure. Transportation issues, patient utilized [facility] transportation and was told no available drivers. Update 04/12/21:  Patient has [place] as a resource as well. Currently UNABLE TO independently self manage needs related to chronic health conditions.  Knowledge Deficits related to short term plan for care coordination needs and long term plans for chronic disease management needs Nurse Case Manager Clinical Goal(s):  patient will work with care management team to address care coordination and chronic disease management needs related to Disease Management Educational Needs Care Coordination Medication Management and Education Medication Reconciliation Medication Assistance  Psychosocial Support   Interventions:  Evaluation of current treatment plan and patient's adherence to plan as established by provider. Reviewed medications with patient. Collaborated with pharmacy regarding medications. Discussed plans with patient for ongoing care management follow up and provided patient with direct contact information for care management team Advised  patient, providing education and rationale, to monitor  blood pressure daily and record, calling provider for findings outside established parameters.  Reviewed scheduled/upcoming provider appointments. Update 04/12/21:  Patient to schedule appointment with Dr. Erlinda Hong for evaluation of knee pain. Update 06/01/21:  Patient has not scheduled a follow up appointment yet. Advised patient, providing education and rationale, to check cbg and record, calling provider for findings outside established parameters.   Care guide referral for financial resources for rent, medicines. Care Guide referral for transportation. Update 04/12/21:  RNCM provided transportation resources to patient. Pharmacy referral for medication review. Update 04/12/21:  Patient has met with Pharmacist and continues to follow. Self Care Activities:  Patient will self administer medications as prescribed Patient will attend all scheduled provider appointments Patient will call pharmacy for medication refills Patient will continue to perform ADL's independently Patient will call provider office for new concerns or questions Patient Goals: In the next 30 days, patient will check blood pressure and blood sugars as directed by provider. In the next 30 days, patient will attend all scheduled appointments. In the next 30 days, patient will meet with Pharmacist to discuss medications.- Follow Up Plan: The patient has been provided with contact information for the care management team and has been advised to call with any health related questions or concerns.  The care management team will reach out to the patient again over the next 30 days.     Evidence-based guidance:   Review biopsychosocial determinants of health screens.  Review need for preventive screening based on age, sex, family history and health history.  Determine level of modifiable health risk. .  Discuss identified risks.  Identify areas where behavior change may lead to improved health.  Promote healthy lifestyle.  Evoke  change talk using open-ended questions, pros and cons, as well as looking forward.  Identify and manage conditions or preconditions to reduce health risk.  Implement additional goals and interventions based on identified risk factors.     Task: Mutually Develop and Royce Macadamia Achievement of Patient Goals   Note:   Care Management Activities:    - verbalization of feelings encouraged    Notes:     Patient Care Plan: Medication Management     Problem Identified: Health Promotion or Disease Self-Management (General Plan of Care)      Goal: Medication Management   Note:   Current Barriers:  Unable to independently monitor therapeutic efficacy Does not adhere to prescribed medication regimen Does not maintain contact with provider office Does not contact provider office for questions/concerns   Pharmacist Clinical Goal(s):  Over the next 60 days, patient will achieve adherence to monitoring guidelines and medication adherence to achieve therapeutic efficacy contact provider office for questions/concerns as evidenced notation of same in electronic health record through collaboration with PharmD and provider.    Interventions: Inter-disciplinary care team collaboration (see longitudinal plan of care) Comprehensive medication review performed; medication list updated in electronic medical record  '@RXCPDIABETES' @ '@RXCPHYPERTENSION' @ '@RXCPHYPERLIPIDEMIA' @ '@RXCPASTHMA' @  Patient Goals/Self-Care Activities Over the next 30 days, patient will:  - take medications as prescribed check blood pressure Weekly, document, and provide at future appointments  Follow Up Plan: The care management team will reach out to the patient again over the next 30 days.      Task: Mutually Develop and Royce Macadamia Achievement of Patient Goals   Note:   Care Management Activities:    - self-reliance encouraged    Notes:

## 2021-06-07 NOTE — Patient Outreach (Signed)
Medicaid Managed Care    Pharmacy Note  06/07/2021 Name: Kathleen Rodriguez MRN: [id number] DOB: [birth year]  [name] is a 55 y.o. year old female who is a primary care patient of [name], MD. [facility] Managed Care Coordination team was consulted for assistance with disease management and care coordination needs.    Engaged with patient Engaged with patient by telephone for follow up visit in response to referral for case management and/or care coordination services.  Ms. Such was given information about Managed Medicaid Care Coordination team services today. [name] agreed to services and verbal consent obtained.   Objective:  Lab Results  Component Value Date   CREATININE 0.79 04/01/2021   CREATININE 0.80 03/31/2021   CREATININE 1.03 (H) 03/31/2021    Lab Results  Component Value Date   HGBA1C 5.9 (H) 03/31/2021       Component Value Date/Time   CHOL 172 12/02/2020 1143   TRIG 111 12/02/2020 1143   HDL 53 12/02/2020 1143   CHOLHDL 3.2 12/02/2020 1143   CHOLHDL 2.2 12/25/2018 0650   VLDL 3 12/25/2018 0650   LDLCALC 99 12/02/2020 1143    Other: (TSH, CBC, Vit D, etc.)  Clinical ASCVD: No  The 10-year ASCVD risk score [name] DC Jr., et al., 2013) is: 12.3%   Values used to calculate the score:     Age: 28 years     Sex: Female     Is Non-Hispanic African American: Yes     Diabetic: Yes     Tobacco smoker: No     Systolic Blood Pressure: 332 mmHg     Is BP treated: Yes     HDL Cholesterol: 53 mg/dL     Total Cholesterol: 172 mg/dL    Other: (CHADS2VASc if Afib, PHQ9 if depression, MMRC or CAT for COPD, ACT, DEXA)  BP Readings from Last 3 Encounters:  04/07/21 135/80  04/02/21 (!) 162/93  03/21/21 (!) 143/74    Assessment/Interventions: Review of patient past medical history, allergies, medications, health status, including review of consultants reports, laboratory and other test data, was performed as part of comprehensive evaluation and  provision of chronic care management services.   DM Lab Results  Component Value Date   HGBA1C 5.9 (H) 03/31/2021   HGBA1C 5.8 (H) 01/21/2021   HGBA1C 5.6 12/02/2020   Lab Results  Component Value Date   LDLCALC 99 12/02/2020   CREATININE 0.79 04/01/2021    Lab Results  Component Value Date   NA 135 04/01/2021   K 4.9 04/01/2021   CREATININE 0.79 04/01/2021   GFRNONAA >60 04/01/2021   GFRAA 84 12/02/2020   GLUCOSE 134 (H) 04/01/2021    Lab Results  Component Value Date   WBC 17.3 (H) 04/01/2021   HGB 13.3 04/01/2021   HCT 42.3 04/01/2021   MCV 89.4 04/01/2021   PLT 260 04/01/2021   Metformin 510m  Plan: At goal,  patient stable/ symptoms controlled   BP/Hot Flashes Clonidine 0.17mBID -not checking BP at home Plan: Check BP 1/month  Nerve Pain Before Meds: Patient really couldn't answer the question After Meds: 7 Gabapentin Plan: I asked patient about openness to changing therapy and she wasn't. At goal,  patient stable/ symptoms controlled  Asthma Albuterol Neb: Uses both nebs together Budesonide Neb: Uses both nebs together Ipratroprium Neb: PRN -Uses above 2x/week ProAir States Breo covered sometimes and sometimes not -Patient taking ProAir and Albuterol together. May 2022 Plan: [place] [facility]. Spoke with [name], not approved. Asked to re-fax June2022: Still not approved, called Pharmacy and they said PA hadn't gone through but they had faxed it. Msg'd Dr. [name] to ask her to compete the PA or to send in a preferred medication for patient since she was hospitalized twice in past month August 2022: Patient can't afford any meds, please see below for plan  SDOH (Social Determinants of Health) assessments and interventions performed:    Care Plan  Allergies  Allergen Reactions   Tomato Hives, Itching and Other (See Comments)    ALSO REACTS TO KETCHUP   Latex Itching and Rash   Latex Hives and Rash    Tomato Hives and Rash   Wool Alcohol [Lanolin] Itching    Medications Reviewed Today     Reviewed by [name], RN (Registered Nurse) on 06/01/21 at 1352  Med List Status: <None>   Medication Order Taking? Sig Documenting Provider Last Dose Status Informant  Accu-Chek Softclix Lancets lancets [id number]  Use to check blood sugar once daily. [name], MD  Active   acetaminophen (TYLENOL) 500 MG tablet [id number] Yes Take 500 mg by mouth every 6 (six) hours as needed for mild pain. [provider] Taking Active Self  albuterol (PROAIR HFA) 108 (90 Base) MCG/ACT inhaler [id number] Yes TAKE 2 PUFFS BY MOUTH EVERY 6 HOURS AS NEEDED FOR WHEEZE OR SHORTNESS OF Cipriano Mile, MD Taking Active   albuterol (PROVENTIL) (2.5 MG/3ML) 0.083% nebulizer solution [id number] Yes Take 3 mLs (2.5 mg total) by nebulization every 6 (six) hours as needed for shortness of breath or wheezing. Shawna Clamp, MD Taking Active   aspirin 81 MG EC tablet [id number] No Take 1 tablet (81 mg total) by mouth daily.  Patient not taking: Reported on 06/01/2021   [name], MD Not Taking Active Self           Med Note Marcell Barlow   Wed Mar 31, 2021  8:32 AM) Needs refill  Blood Glucose Monitoring Suppl (ACCU-CHEK GUIDE ME) w/Device KIT [id number] Yes Use to check blood sugar once daily. [name], MD Taking Active   budesonide (PULMICORT) 0.25 MG/2ML nebulizer solution [id number]  TAKE 1 VIAL (0.25 MG TOTAL) BY NEBULIZATION 2 (TWO) TIMES DAILY. Shawna Clamp, MD  Expired 05/02/21 2359   cloNIDine (CATAPRES) 0.2 MG tablet [id number] No Take 0.2 mg by mouth 2 (two) times daily.  Patient not taking: Reported on 06/01/2021   [provider] Not Taking Active Self  EPINEPHrine 0.3 mg/0.3 mL IJ SOAJ injection [id number]  Inject 0.3 mg into the muscle as needed for anaphylaxis. Chesley Mires, MD  Active   fluticasone [facility]) 50 MCG/ACT nasal spray [id number] No Place 1 spray into both  nostrils daily as needed for allergies.  Patient not taking: Reported on 06/01/2021   Chesley Mires, MD Not Taking Active Self  fluticasone-salmeterol (ADVAIR [facility]) 230-21 MCG/ACT inhaler [id number] Yes Inhale 2 puffs into the lungs 2 (two) times daily. Chesley Mires, MD Taking Active   glucose blood (ACCU-CHEK GUIDE) test strip [id number]  Use as instructed daily before breakfast. [name], MD  Active   metFORMIN (GLUCOPHAGE) 500 MG tablet [id number] Yes TAKE 1 TABLET BY MOUTH 2 TIMES DAILY WITH A MEAL. [name], MD Taking Active   methocarbamol (ROBAXIN) 500 MG tablet [id number] No TAKE 1 TABLET (500 MG TOTAL) BY MOUTH EVERY 8 (EIGHT) HOURS AS NEEDED FOR MUSCLE SPASMS.  Patient not taking: Reported on 06/01/2021  Elsie Stain, MD Not Taking Active   Misc. Devices MISC [id number]  Blood pressure monitor  Dx: Hypertension [name], MD  Active Self  Misc. Devices [place] [id number]  Depends . Wt- 250 lbs, size 1X; Poise pads [name], MD  Active Self  Multiple Vitamin (MULTIVITAMIN WITH MINERALS) TABS tablet [id number] No Take 1 tablet by mouth daily.  Patient not taking: Reported on 06/01/2021   [provider] Not Taking Active Self            Patient Active Problem List   Diagnosis Date Noted   Diabetes mellitus without complication ([place]) 33/29/5188   OSA (obstructive sleep apnea) 01/20/2021   Essential hypertension 01/20/2021   Obesity (BMI 30-39.9) 01/20/2021   Acute asthma exacerbation 10/09/2019   COPD mixed type ([place]) 07/04/2019   Asthma 10/22/2017   Osteoarthritis of left knee 09/01/2016   Total knee replacement status 09/01/2016   Osteoarthritis of knees, 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   Primary osteoarthritis of left knee 03/09/2016   Anemia, iron deficiency 08/12/2015   Dysfunctional uterine bleeding 12/28/2014   Morbid obesity ([place]) 05/16/2014   Upper airway cough syndrome 05/01/2014   GERD (gastroesophageal reflux disease) 04/08/2014   OSA  (obstructive sleep apnea) 03/20/2013    Conditions to be addressed/monitored: HTN, DM and Pulmonary Disease  Care Plan : Medication Management  Updates made by Lane Hacker, [place] since 03/01/2021 12:00 AM     Problem: Health Promotion or Disease Self-Management (General Plan of Care)      Goal: Medication Management   Note:   Current Barriers:  Unable to independently monitor therapeutic efficacy Does not adhere to prescribed medication regimen Does not maintain contact with provider office Does not contact provider office for questions/concerns   Pharmacist Clinical Goal(s):  Over the next 30 days, patient will achieve adherence to monitoring guidelines and medication adherence to achieve therapeutic efficacy contact provider office for questions/concerns as evidenced notation of same in electronic health record through collaboration with PharmD and provider.    Interventions: Inter-disciplinary care team collaboration (see longitudinal plan of care) Comprehensive medication review performed; medication list updated in electronic medical record  '@RXCPDIABETES' @ '@RXCPHYPERTENSION' @ '@RXCPHYPERLIPIDEMIA' @ '@RXCPASTHMA' @  Patient Goals/Self-Care Activities Over the next 30 days, patient will:  - take medications as prescribed check blood pressure Weekly, document, and provide at future appointments  Follow Up Plan: The care management team will reach out to the patient again over the next 30 days.      Task: Mutually Develop and Royce Macadamia Achievement of Patient Goals   Note:   Care Management Activities:    - verbalization of feelings encouraged    Notes:      Medication Assistance:  Patient can't afford medications at all. I informed her that in [place] with Medicaid, some Rx's will waive the co-pay. However, there's no way to know which ones do or don't. I informed her I cannot make those phone calls nor tell her which places do or don't. I encouraged her to google them and  that once she finds a place, she will call me back and I will facilitate the transfer for her  Follow up: Agree   Plan: The care management team will reach out to the patient again over the next 60 days.   [place] Constable, Pharm.D., Managed Medicaid Pharmacist - [phone]

## 2021-06-17 ENCOUNTER — Ambulatory Visit: Payer: Medicaid Other | Admitting: Physician Assistant

## 2021-06-17 ENCOUNTER — Encounter (HOSPITAL_COMMUNITY): Payer: Self-pay | Admitting: Emergency Medicine

## 2021-06-17 ENCOUNTER — Emergency Department (HOSPITAL_COMMUNITY)
Admission: EM | Admit: 2021-06-17 | Discharge: 2021-06-17 | Disposition: A | Discharge status: Home / Self Care | Payer: Medicaid Other | Attending: Emergency Medicine | Admitting: Emergency Medicine

## 2021-06-17 DIAGNOSIS — J449 Chronic obstructive pulmonary disease, unspecified: Secondary | ICD-10-CM | POA: Diagnosis not present

## 2021-06-17 DIAGNOSIS — Z96651 Presence of right artificial knee joint: Secondary | ICD-10-CM | POA: Diagnosis not present

## 2021-06-17 DIAGNOSIS — Z91040 Latex allergy status: Secondary | ICD-10-CM | POA: Diagnosis not present

## 2021-06-17 DIAGNOSIS — E119 Type 2 diabetes mellitus without complications: Secondary | ICD-10-CM | POA: Insufficient documentation

## 2021-06-17 DIAGNOSIS — R110 Nausea: Secondary | ICD-10-CM | POA: Diagnosis not present

## 2021-06-17 DIAGNOSIS — J45909 Unspecified asthma, uncomplicated: Secondary | ICD-10-CM | POA: Diagnosis not present

## 2021-06-17 DIAGNOSIS — Z87891 Personal history of nicotine dependence: Secondary | ICD-10-CM | POA: Diagnosis not present

## 2021-06-17 DIAGNOSIS — M545 Low back pain, unspecified: Secondary | ICD-10-CM | POA: Diagnosis not present

## 2021-06-17 DIAGNOSIS — I110 Hypertensive heart disease with heart failure: Secondary | ICD-10-CM | POA: Diagnosis not present

## 2021-06-17 DIAGNOSIS — I509 Heart failure, unspecified: Secondary | ICD-10-CM | POA: Diagnosis not present

## 2021-06-17 DIAGNOSIS — M549 Dorsalgia, unspecified: Secondary | ICD-10-CM | POA: Diagnosis not present

## 2021-06-17 DIAGNOSIS — M5459 Other low back pain: Secondary | ICD-10-CM | POA: Diagnosis not present

## 2021-06-17 LAB — CBC WITH DIFFERENTIAL/PLATELET
Abs Immature Granulocytes: 0.02 10*3/uL (ref 0.00–0.07)
Basophils Absolute: 0.1 10*3/uL (ref 0.0–0.1)
Basophils Relative: 1 %
Eosinophils Absolute: 0.2 10*3/uL (ref 0.0–0.5)
Eosinophils Relative: 3 %
HCT: 47.7 % — ABNORMAL HIGH (ref 36.0–46.0)
Hemoglobin: 15.3 g/dL — ABNORMAL HIGH (ref 12.0–15.0)
Immature Granulocytes: 0 %
Lymphocytes Relative: 29 %
Lymphs Abs: 2.6 10*3/uL (ref 0.7–4.0)
MCH: 28.9 pg (ref 26.0–34.0)
MCHC: 32.1 g/dL (ref 30.0–36.0)
MCV: 90 fL (ref 80.0–100.0)
Monocytes Absolute: 0.8 10*3/uL (ref 0.1–1.0)
Monocytes Relative: 8 %
Neutro Abs: 5.4 10*3/uL (ref 1.7–7.7)
Neutrophils Relative %: 59 %
Platelets: 331 10*3/uL (ref 150–400)
RBC: 5.3 MIL/uL — ABNORMAL HIGH (ref 3.87–5.11)
RDW: 14.8 % (ref 11.5–15.5)
WBC: 9.1 10*3/uL (ref 4.0–10.5)
nRBC: 0 % (ref 0.0–0.2)

## 2021-06-17 LAB — BASIC METABOLIC PANEL
Anion gap: 9 (ref 5–15)
BUN: 8 mg/dL (ref 6–20)
CO2: 25 mmol/L (ref 22–32)
Calcium: 8.5 mg/dL — ABNORMAL LOW (ref 8.9–10.3)
Chloride: 104 mmol/L (ref 98–111)
Creatinine, Ser: 0.97 mg/dL (ref 0.44–1.00)
GFR, Estimated: >60 mL/min (ref >60)
Glucose, Bld: 98 mg/dL (ref 70–99)
Potassium: 4 mmol/L (ref 3.5–5.1)
Sodium: 138 mmol/L (ref 135–145)

## 2021-06-17 LAB — URINALYSIS, ROUTINE W REFLEX MICROSCOPIC
Bilirubin Urine: NEGATIVE
Glucose, UA: NEGATIVE mg/dL
Hgb urine dipstick: NEGATIVE
Ketones, ur: 20 mg/dL — AB
Leukocytes,Ua: NEGATIVE
Nitrite: NEGATIVE
Protein, ur: NEGATIVE mg/dL
Specific Gravity, Urine: 1.027 (ref 1.005–1.030)
pH: 5 (ref 5.0–8.0)

## 2021-06-17 MED ORDER — KETOROLAC TROMETHAMINE 30 MG/ML IJ SOLN
30.0000 mg | Freq: Once | INTRAMUSCULAR | Status: AC
  Administered 2021-06-17: 30 mg via INTRAMUSCULAR
  Filled 2021-06-17: qty 1

## 2021-06-17 MED ORDER — OXYCODONE-ACETAMINOPHEN 5-325 MG PO TABS: 1.0000 | ORAL_TABLET | Freq: Four times a day (QID) | ORAL | 0 refills | Status: DC | PRN

## 2021-06-17 MED ORDER — OXYCODONE-ACETAMINOPHEN 5-325 MG PO TABS
1.0000 | ORAL_TABLET | Freq: Once | ORAL | Status: AC
  Administered 2021-06-17: 1 via ORAL
  Filled 2021-06-17: qty 1

## 2021-06-17 NOTE — Discharge Instructions (Addendum)
Call your primary care doctor or specialist as discussed in the next 2-3 days.   Return immediately back to the ER if:  Your symptoms worsen within the next 12-24 hours. You develop new symptoms such as new fevers, persistent vomiting, new pain, shortness of breath, or new weakness or numbness, or if you have any other concerns.  

## 2021-06-17 NOTE — ED Provider Notes (Signed)
[facility] EMERGENCY DEPARTMENT Provider Note   CSN: [id number] Arrival date & time: 06/17/21  1316     History Chief Complaint  Patient presents with   Back Pain    Kathleen Rodriguez is a 55 y.o. female.  Patient presents with chief complaint of lower back pain.  Triage notation states that it started yesterday.  However upon my evaluation patient states that started about 4 days ago.  She noticed a mild discomfort in the left lower back about 4 days ago.  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  She feels like one of her interactions with the patient's may have caused her to injure her back some more.  She denies any specific fall or other trauma.  She woke up this morning with worsening left-sided lower back pain rating down to her mid posterior thigh.  Denies any new numbness or weakness.  Denies any bowel or bladder dysfunction denies any fevers.      Past Medical History:  Diagnosis Date   Arthritis    "knees, lower back; legs, ankles" (01/27/2016)   Asthma    followed by Dr. 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   Diabetes mellitus without complication ([place])    Dyspnea    GERD (gastroesophageal reflux disease)    Hypertension    "doctor took me off RX in 2016" (01/27/2016)   Hypertension    Influenza B April 5284   Complicated by multi-organ failure   OSA on CPAP "since " 03/20/2013   Pneumonia 2016   Required emergent intubation    asthma exacerbation in 2016   Skin ulcer ([place]) secondary to bullous impetigo Marilynn Rail 08/29/2018   Sleep apnea    Tobacco abuse    Upper airway cough syndrome     Patient Active Problem List   Diagnosis Date Noted   Diabetes mellitus without complication ([place]) 13/24/4010   OSA (obstructive sleep apnea) 01/20/2021   Essential hypertension 01/20/2021   Obesity (BMI 30-39.9) 01/20/2021   Acute  asthma exacerbation 10/09/2019   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   Asthma 10/22/2017   Osteoarthritis of left knee 09/01/2016   Total knee replacement status 09/01/2016   Osteoarthritis of knees, 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   Primary osteoarthritis of left knee 03/09/2016   Anemia, iron deficiency 08/12/2015   Dysfunctional uterine bleeding 12/28/2014   Morbid obesity ([place]) 05/16/2014   Upper airway cough syndrome 05/01/2014   GERD (gastroesophageal reflux disease) 04/08/2014   OSA (obstructive sleep apnea) 03/20/2013    Past Surgical History:  Procedure Laterality Date   BREAST SURGERY Right 1980   as a teenager , cyst was benign   CESAREAN SECTION  2006   LACERATION REPAIR Right ~ 1997   "tried to cut myself"   TOTAL KNEE ARTHROPLASTY Left 09/01/2016   Procedure: LEFT TOTAL KNEE ARTHROPLASTY;  Surgeon: Leandrew Koyanagi, MD;  Location: [facility];  Service: Orthopedics;  Laterality: Left;   TUBAL LIGATION  2006     OB History   No obstetric history on file.     Family History  Problem Relation Age of Onset   Asthma Mother    Heart murmur Mother    Cancer Maternal Grandmother    Heart disease Maternal Grandmother    Hypertension Maternal Grandmother    Cancer Paternal Grandmother  Hypertension Mother    HIV/AIDS Father     Social History   Tobacco Use   Smoking status: Former    Packs/day: 0.25    Years: 21.00    Pack years: 5.25    Types: Cigarettes    Start date: 1993    Quit date: 01/31/2013    Years since quitting: 8.3   Smokeless tobacco: Never  Vaping Use   Vaping Use: Never used  Substance Use Topics   Drug use: Not Currently    Types: Cocaine, "Crack" cocaine    Comment: 01/27/2016 "clean from smoking crack since 2007"    Home Medications Prior to Admission medications   Medication Sig Start Date End Date Taking? Authorizing Provider  oxyCODONE-acetaminophen (PERCOCET/ROXICET) 5-325 MG tablet Take 1 tablet by mouth every 6 (six) hours as needed for up to 8  doses for severe pain. 06/17/21  Yes Luna Fuse, MD  Accu-Chek Softclix Lancets lancets Use to check blood sugar once daily. 05/04/21   [name], MD  albuterol (PROAIR HFA) 108 (90 Base) MCG/ACT inhaler TAKE 2 PUFFS BY MOUTH EVERY 6 HOURS AS NEEDED FOR WHEEZE OR SHORTNESS OF BREATH 04/02/21   Shawna Clamp, MD  albuterol (PROVENTIL) (2.5 MG/3ML) 0.083% nebulizer solution Take 3 mLs (2.5 mg total) by nebulization every 6 (six) hours as needed for shortness of breath or wheezing. 04/02/21   Shawna Clamp, MD  aspirin 81 MG EC tablet Take 1 tablet (81 mg total) by mouth daily. Patient not taking: Reported on 06/01/2021 02/07/19   [name], MD  Blood Glucose Monitoring Suppl (ACCU-CHEK GUIDE ME) w/Device KIT Use to check blood sugar once daily. 05/04/21   Newlin, Charlane Ferretti, MD  budesonide (PULMICORT) 0.25 MG/2ML nebulizer solution TAKE 1 VIAL (0.25 MG TOTAL) BY NEBULIZATION 2 (TWO) TIMES DAILY. 04/02/21 05/02/21  Shawna Clamp, MD  cloNIDine (CATAPRES) 0.2 MG tablet Take 0.2 mg by mouth 2 (two) times daily. Patient not taking: Reported on 06/01/2021    [provider]  EPINEPHrine 0.3 mg/0.3 mL IJ SOAJ injection Inject 0.3 mg into the muscle as needed for anaphylaxis. 05/06/21   Chesley Mires, MD  fluticasone (FLONASE) 50 MCG/ACT nasal spray Place 1 spray into both nostrils daily as needed for allergies. Patient not taking: Reported on 06/01/2021 01/04/21   Chesley Mires, MD  fluticasone-salmeterol (ADVAIR HFA) [phone] MCG/ACT inhaler Inhale 2 puffs into the lungs 2 (two) times daily. 04/27/21   Chesley Mires, MD  glucose blood (ACCU-CHEK GUIDE) test strip Use as instructed daily before breakfast. 05/04/21   [name], MD  metFORMIN (GLUCOPHAGE) 500 MG tablet TAKE 1 TABLET BY MOUTH 2 TIMES DAILY WITH A MEAL. 05/26/21   [name], MD  methocarbamol (ROBAXIN) 500 MG tablet TAKE 1 TABLET (500 MG TOTAL) BY MOUTH EVERY 8 (EIGHT) HOURS AS NEEDED FOR MUSCLE SPASMS. Patient not taking: Reported  on 06/01/2021 05/28/21   Elsie Stain, MD  Misc. Devices MISC Blood pressure monitor  Dx: Hypertension 02/07/19   [name], MD  Misc. Devices MISC Depends . Wt- 250 lbs, size 1X; Poise pads 03/23/21   [name], MD  Multiple Vitamin (MULTIVITAMIN WITH MINERALS) TABS tablet Take 1 tablet by mouth daily. Patient not taking: Reported on 06/01/2021    [provider]    Allergies    Tomato, Latex, Latex, Tomato, and Wool alcohol [lanolin]  Review of Systems   Review of Systems  Constitutional:  Negative for fever.  HENT:  Negative for ear pain.   Eyes:  Negative for pain.  Respiratory:  Negative for cough.   Cardiovascular:  Negative for chest pain.  Gastrointestinal:  Negative for abdominal pain.  Genitourinary:  Negative for flank pain.  Musculoskeletal:  Positive for back pain.  Skin:  Negative for rash.  Neurological:  Negative for headaches.   Physical Exam Updated Vital Signs BP 126/86 (BP Location: Right Arm)   Pulse 100   Temp 98.6 F (37 C)   Resp 17   SpO2 97%   Physical Exam Constitutional:      General: She is not in acute distress.    Appearance: Normal appearance.  HENT:     Head: Normocephalic.     Nose: Nose normal.  Eyes:     Extraocular Movements: Extraocular movements intact.  Cardiovascular:     Rate and Rhythm: Normal rate.  Pulmonary:     Effort: Pulmonary effort is normal.  Musculoskeletal:        General: Normal range of motion.     Cervical back: Normal range of motion.     Comments: Moderate tenderness in the L4-5 left paraspinal region.  Neurological:     General: No focal deficit present.     Mental Status: She is alert. Mental status is at baseline.     Cranial Nerves: No cranial nerve deficit.     Motor: No weakness.     Comments: 5/5 strength bilateral lower extremities.  Patient has an antalgic gait.    ED Results / Procedures / Treatments   Labs (all labs ordered are listed, but only abnormal results are  displayed) Labs Reviewed  URINALYSIS, ROUTINE W REFLEX MICROSCOPIC - Abnormal; Notable for the following components:      Result Value   Color, Urine AMBER (*)    Ketones, ur 20 (*)    All other components within normal limits  BASIC METABOLIC PANEL - Abnormal; Notable for the following components:   Calcium 8.5 (*)    All other components within normal limits  CBC WITH DIFFERENTIAL/PLATELET - Abnormal; Notable for the following components:   RBC 5.30 (*)    Hemoglobin 15.3 (*)    HCT 47.7 (*)    All other components within normal limits    EKG None  Radiology No results found.  Procedures Procedures   Medications Ordered in ED Medications  ketorolac (TORADOL) 30 MG/ML injection 30 mg (30 mg Intramuscular Given 06/17/21 1345)  oxyCODONE-acetaminophen (PERCOCET/ROXICET) 5-325 MG per tablet 1 tablet (1 tablet Oral Given 06/17/21 1722)    ED Course  I have reviewed the triage vital signs and the nursing notes.  Pertinent labs & imaging results that were available during my care of the patient were reviewed by me and considered in my medical decision making (see chart for details).    MDM Rules/Calculators/A&P                           Temperature noted to be slightly elevated, however the patient denies any fevers at home.  She denies a history of IV drug use.  Her left lower back pain appears musculoskeletal in nature given her line of work and history of her present illness at this time.  Recommending medications at rest at home, advised follow-up with her primary care doctor within the week.  Advising immediate return for new numbness weakness fevers or any additional concerns.  Final Clinical Impression(s) / ED Diagnoses Final diagnoses:  Acute left-sided low back pain without sciatica  Rx / DC Orders ED Discharge Orders          Ordered    oxyCODONE-acetaminophen (PERCOCET/ROXICET) 5-325 MG tablet  Every 6 hours PRN        06/17/21 1740              Luna Fuse, MD 06/17/21 1740

## 2021-06-17 NOTE — ED Triage Notes (Signed)
Patient complains of left lower back pain present when she woke this morning. No known injury, no recent fall. Patient alert, oriented, and in no apparent distress at this time.

## 2021-06-17 NOTE — ED Provider Notes (Signed)
Emergency Medicine Provider Triage Evaluation Note  Kathleen Rodriguez , a 55 y.o. female  was evaluated in triage.  Pt complains of flank pain.  Review of Systems  Positive: L flank pain Negative: Fever, numbness, dysuria, hematuria, abd pain  Physical Exam  BP (!) 134/93 (BP Location: Left Arm)   Pulse 97   Temp 99.5 F (37.5 C) (Oral)   Resp 18   SpO2 98%  Gen:   Awake, moaning, appears uncomfortable Resp:  Normal effort  MSK:   Increasing pain with straight leg raise Other:  TTP to L lumbosacral and paralumbar spinal muscle.  Tenderness to lumbar spine  Medical Decision Making  Medically screening exam initiated at 1:40 PM.  Appropriate orders placed.  PYPER OLEXA was informed that the remainder of the evaluation will be completed by another provider, this initial triage assessment does not replace that evaluation, and the importance of remaining in the ED until their evaluation is complete.  Pt here with acute onset of Left lower back pain that started earlier today, worse with movement but persists at rest.  She appears uncomfortable. Denies myalgia or having cold sxs.  No hx of kidney stone.  Denies hx of sciatica.   Fayrene Helper, PA-C 06/17/21 1343    Pricilla Loveless, MD 06/17/21 [phone]

## 2021-06-18 ENCOUNTER — Telehealth: Payer: Self-pay

## 2021-06-18 NOTE — Telephone Encounter (Signed)
Transition Care Management Unsuccessful Follow-up Telephone Call  Date of discharge and from where:  06/17/2021-[facility]   Attempts:  1st Attempt  Reason for unsuccessful TCM follow-up call:  Left voice message    

## 2021-06-20 NOTE — Telephone Encounter (Signed)
Transition Care Management Follow-up Telephone Call Date of discharge and from where: 06/17/2021 from [facility] How have you been since you were released from the hospital? Pt stated that she is feeling the same with no changes.  Any questions or concerns? No  Items Reviewed: Did the pt receive and understand the discharge instructions provided? Yes  Medications obtained and verified? Yes  Other? No  Any new allergies since your discharge? No  Dietary orders reviewed? No Do you have support at home? Yes   Functional Questionnaire: (I = Independent and D = Dependent) ADLs: I  Bathing/Dressing- I  Meal Prep- I  Eating- I  Maintaining continence- I  Transferring/Ambulation- I  Managing Meds- I   Follow up appointments reviewed:  PCP Hospital f/u appt confirmed? No  Pt encouraged to call Dr. Baxter Flattery office [facility] f/u appt confirmed? No   Are transportation arrangements needed? No  If their condition worsens, is the pt aware to call PCP or go to the Emergency Dept.? Yes Was the patient provided with contact information for the PCP's office or ED? Yes Was to pt encouraged to call back with questions or concerns? Yes

## 2021-06-22 ENCOUNTER — Encounter: Payer: Medicaid Other | Attending: Family Medicine | Admitting: Dietician

## 2021-06-23 ENCOUNTER — Telehealth: Payer: Self-pay | Admitting: *Deleted

## 2021-07-02 ENCOUNTER — Other Ambulatory Visit: Payer: Self-pay

## 2021-07-02 ENCOUNTER — Other Ambulatory Visit: Payer: Self-pay | Admitting: Obstetrics and Gynecology

## 2021-07-02 NOTE — Patient Outreach (Signed)
Medicaid Managed Care   Nurse Care Manager Note  07/02/2021 Name:  Kathleen Rodriguez MRN:  [id number] DOB:  15-Jun-1966  Kathleen Rodriguez is an 55 y.o. year old female who is a primary patient of [name], MD.  The [facility] Managed Care Coordination team was consulted for assistance with:    Chronic healthcare management needs.  [name] was given information about Medicaid Managed Care Coordination team services today. Kathleen Rodriguez Patient agreed to services and verbal consent obtained.  Engaged with patient by telephone for follow up visit in response to provider referral for case management and/or care coordination services.   Assessments/Interventions:  Review of past medical history, allergies, medications, health status, including review of consultants reports, laboratory and other test data, was performed as part of comprehensive evaluation and provision of chronic care management services.  SDOH (Social Determinants of Health) assessments and interventions performed:   Care Plan  Allergies  Allergen Reactions   Tomato Hives, Itching and Other (See Comments)    ALSO REACTS TO KETCHUP   Latex Itching and Rash   Latex Hives and Rash   Tomato Hives and Rash   Wool Alcohol [Lanolin] Itching    Medications Reviewed Today     Reviewed by [name], RN (Registered Nurse) on 07/02/21 at [place] List Status: <None>   Medication Order Taking? Sig Documenting Provider Last Dose Status Informant  Accu-Chek Softclix Lancets lancets [id number]  Use to check blood sugar once daily. [name], MD  Active   albuterol (PROAIR HFA) 108 (90 Base) MCG/ACT inhaler [id number]  TAKE 2 PUFFS BY MOUTH EVERY 6 HOURS AS NEEDED FOR WHEEZE OR SHORTNESS OF Cipriano Mile, MD  Active   albuterol (PROVENTIL) (2.5 MG/3ML) 0.083% nebulizer solution [id number]  Take 3 mLs (2.5 mg total) by nebulization every 6 (six) hours as needed for shortness of breath or wheezing. Shawna Clamp, MD   Active   aspirin 81 MG EC tablet [id number]  Take 1 tablet (81 mg total) by mouth daily.  Patient not taking: Reported on 06/01/2021   [name], MD  Active Self           Med Note Alesia Banda, Woodfin Ganja   Wed Mar 31, 2021  8:32 AM) Needs refill  Blood Glucose Monitoring Suppl (ACCU-CHEK GUIDE ME) w/Device KIT [id number]  Use to check blood sugar once daily. [name], MD  Active   budesonide (PULMICORT) 0.25 MG/2ML nebulizer solution [id number]  TAKE 1 VIAL (0.25 MG TOTAL) BY NEBULIZATION 2 (TWO) TIMES DAILY. Shawna Clamp, MD  Expired 05/02/21 2359   cloNIDine (CATAPRES) 0.2 MG tablet [id number] Yes Take 0.2 mg by mouth 2 (two) times daily. [provider] Taking Active   EPINEPHrine 0.3 mg/0.3 mL IJ SOAJ injection [id number]  Inject 0.3 mg into the muscle as needed for anaphylaxis. Chesley Mires, MD  Active   fluticasone [facility]) 50 MCG/ACT nasal spray [id number] Yes Place 1 spray into both nostrils daily as needed for allergies. Chesley Mires, MD Taking Active   fluticasone-salmeterol (ADVAIR HFA) [id number] MCG/ACT inhaler [id number]  Inhale 2 puffs into the lungs 2 (two) times daily. Chesley Mires, MD  Active   glucose blood (ACCU-CHEK GUIDE) test strip [id number]  Use as instructed daily before breakfast. [name], MD  Active   metFORMIN (GLUCOPHAGE) 500 MG tablet [id number]  TAKE 1 TABLET BY MOUTH 2 TIMES DAILY WITH A MEAL. [name], MD  Active   methocarbamol (ROBAXIN) 500 MG tablet [id number]  TAKE  1 TABLET (500 MG TOTAL) BY MOUTH EVERY 8 (EIGHT) HOURS AS NEEDED FOR MUSCLE SPASMS.  Patient not taking: Reported on 06/01/2021   Wright, Patrick E, MD  Active   Misc. Devices MISC [id number]  Blood pressure monitor  Dx: Hypertension Newlin, [name], MD  Active Self  Misc. Devices MISC [id number]  Depends . Wt- 250 lbs, size 1X; Poise pads Newlin, [name], MD  Active Self  Multiple Vitamin (MULTIVITAMIN WITH MINERALS) TABS tablet [id number]  Take 1 tablet by mouth daily.  Patient not  taking: Reported on 06/01/2021   [provider]  Active Self  oxyCODONE-acetaminophen (PERCOCET/ROXICET) 5-325 MG tablet [id number]  Take 1 tablet by mouth every 6 (six) hours as needed for up to 8 doses for severe pain. Hong, Joshua S, MD  Active             Patient Active Problem List   Diagnosis Date Noted   Diabetes mellitus without complication (HCC) 01/20/2021   OSA (obstructive sleep apnea) 01/20/2021   Essential hypertension 01/20/2021   Obesity (BMI 30-39.9) 01/20/2021   Acute asthma exacerbation 10/09/2019   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   Asthma 10/22/2017   Osteoarthritis of left knee 09/01/2016   Total knee replacement status 09/01/2016   Osteoarthritis of knees, 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   Primary osteoarthritis of left knee 03/09/2016   Anemia, iron deficiency 08/12/2015   Dysfunctional uterine bleeding 12/28/2014   Morbid obesity (HCC) 05/16/2014   Upper airway cough syndrome 05/01/2014   GERD (gastroesophageal reflux disease) 04/08/2014   OSA (obstructive sleep apnea) 03/20/2013    Conditions to be addressed/monitored per PCP order:   chronic healthcare management needs, OSA, GERD, osteoarthritis, asthma, COPD, HTN, DM.  Care Plan : General Plan of Care (Adult)  Updates made by Craft, Terri G, RN since 07/02/2021 12:00 AM     Problem: Health Promotion or Disease Self-Management (General Plan of Care)   Priority: High  Onset Date: 02/24/2021     Long-Range Goal: Self-Management Plan Developed   Start Date: 02/24/2021  Expected End Date: 09/01/2021  Recent Progress: Not on track  Priority: High  Note:   Current Barriers:  Ineffective Self Health Maintenance-patient not checking blood sugars-needs to pick new glucometer up at pharmacy.  Also, checking blood pressure irregularly. Patient states she is struggling to pay for rent and medicines. Update 07/02/21:  Patient states she is checking blood sugar daily and blood pressure every 3-4 days.  Patient states  blood sugar 120s and blood pressure "good"-could not recall reading.  Recently seen in ED for flank pain, knee pain continues-to schedule appointments with Dr. Xu and Dr. Newlin for follow up. Update 04/12/21:  Patient states she is checking blood sugar twice a day and checking blood pressure daily.  Patient complains of knee pain today. Update 06/01/21:  Patient states she is not checking blood sugars or blood pressure. Transportation issues, patient utilized [employer] transportation and was told no available drivers. Update 04/12/21:  Patient has [place] [employer] as a resource as well. Currently UNABLE TO independently self manage needs related to chronic health conditions.  Knowledge Deficits related to short term plan for care coordination needs and long term plans for chronic disease management needs Nurse Case Manager Clinical Goal(s):  patient will work with care management team to address care coordination and chronic disease management needs related to Disease Management Educational Needs Care Coordination Medication Management and Education Medication Reconciliation Medication Assistance  Psychosocial Support   Interventions:  Evaluation of current treatment   plan and patient's adherence to plan as established by provider. Reviewed medications with patient. Collaborated with pharmacy regarding medications. Discussed plans with patient for ongoing care management follow up and provided patient with direct contact information for care management team Advised patient, providing education and rationale, to monitor blood pressure daily and record, calling provider for findings outside established parameters.  Reviewed scheduled/upcoming provider appointments. Update 04/12/21:  Patient to schedule appointment with Dr. Erlinda Hong for evaluation of knee pain. Update 06/01/21:  Patient has not scheduled a follow up appointment yet. Advised patient, providing education and rationale, to check cbg and  record, calling provider for findings outside established parameters.   Care guide referral for financial resources for rent, medicines-07/02/21-completed-patient provided resources. Care Guide referral for transportation. Update 04/12/21:  RNCM provided transportation resources to patient. Pharmacy referral for medication review. Update 04/12/21:  Patient has met with Pharmacist and continues to follow. Self Care Activities:  Patient will self administer medications as prescribed Patient will attend all scheduled provider appointments Patient will call pharmacy for medication refills Patient will continue to perform ADL's independently Patient will call provider office for new concerns or questions Patient Goals: In the next 30 days, patient will check blood pressure and blood sugars as directed by provider. In the next 30 days, patient will attend all scheduled appointments. In the next 30 days, patient will meet with Pharmacist to discuss medications.- In the next 30 days, patient will schedule appointment with Dr. Erlinda Hong and Dr. Margarita Rana. Follow Up Plan: The patient has been provided with contact information for the care management team and has been advised to call with any health related questions or concerns.  The care management team will reach out to the patient again over the next 30 days.     Evidence-based guidance:   Review biopsychosocial determinants of health screens.  Review need for preventive screening based on age, sex, family history and health history.  Determine level of modifiable health risk. .  Discuss identified risks.  Identify areas where behavior change may lead to improved health.  Promote healthy lifestyle.  Evoke change talk using open-ended questions, pros and cons, as well as looking forward.  Identify and manage conditions or preconditions to reduce health risk.  Implement additional goals and interventions based on identified risk factors.      Follow Up:   Patient agrees to Care Plan and Follow-up.  Plan: The Managed Medicaid care management team will reach out to the patient again over the next 30 days. and The patient has been provided with contact information for the Managed Medicaid care management team and has been advised to call with any health related questions or concerns.  Date/time of next scheduled RN care management/care coordination outreach:  07/30/21 at 1245.

## 2021-07-02 NOTE — Patient Instructions (Signed)
Hi Ms. Killman, thank you for speaking with me today.  Please remember to schedule your appointments.  Ms. Luzader was given information about Medicaid Managed Care team care coordination services as a part of their [place] Medicaid benefit. [name] verbally consented to engagement with the [facility] Managed Care team.   If you are experiencing a medical emergency, please call 911 or report to your local emergency department or urgent care.   If you have a non-emergency medical problem during routine business hours, please contact your provider's office and ask to speak with a nurse.   For questions related to your [facility], please call: [phone] or visit the homepage here: [email or web address]  If you would like to schedule transportation through your [facility], please call the following number at least 2 days in advance of your appointment: [phone].   Call the [place] at [phone], at any time, 24 hours a day, 7 days a week. If you are in danger or need immediate medical attention call 911.  If you would like help to quit smoking, call 1-800-QUIT-NOW 228-180-5317) OR Espaol: 1-855-Djelo-Ya (5-462-703-5009) o para ms informacin haga clic aqu or Text READY to 200-400 to register via text  Ms. Ronnald Ramp - following are the goals we discussed in your visit today:   Goals Addressed             This Visit's Progress    Protect My Health       Timeframe:  Long-Range Goal Priority:  High Start Date:        02/24/21                     Expected End Date:       ongoing            Follow Up Date: 08/01/21   - schedule appointment for flu shot - schedule appointment for vaccines needed due to my age or health - schedule recommended health tests (blood work, mammogram, colonoscopy, pap test) - schedule and keep appointment for  annual check-up   Update 04/12/21:  Patient attending all appointments. Update 06/01/21:  Patient has appt 06/21/21 with [facility] about diabetic shoes and 06/22/21 with dietican. Update 07/02/21:  Patient to schedule appointments with Dr. Erlinda Hong and Dr. Margarita Rana for follow up.  Why is this important?   Screening tests can find diseases early when they are easier to treat.  Your doctor or nurse will talk with you about which tests are important for you.  Getting shots for common diseases like the flu and shingles will help prevent them.            The patient verbalized understanding of instructions provided today and declined a print copy of patient instruction materials.   The Managed Medicaid care management team will reach out to the patient again over the next 30 days.  The  Patient  has been provided with contact information for the Managed Medicaid care management team and has been advised to call with any health related questions or concerns.   [name] RN, BSN [place]  [facility] - Managed Medicaid High Risk [phone].    Following is a copy of your plan of care:  Patient Care Plan: General Plan of Care (Adult)     Problem Identified: Health Promotion or Disease Self-Management (General Plan of Care)   Priority: High  Onset  Date: 02/24/2021   Long-Range Goal: Self-Management Plan Developed   Start Date: 02/24/2021  Expected End Date: 09/01/2021  Recent Progress: Not on track  Priority: High  Note:   Current Barriers:  Ineffective Self Health Maintenance-patient not checking blood sugars-needs to pick new glucometer up at pharmacy.  Also, checking blood pressure irregularly. Patient states she is struggling to pay for rent and medicines. Update 07/02/21:  Patient states she is checking blood sugar daily and blood pressure every 3-4 days.  Patient states blood sugar 120s and blood pressure "good"-could not recall reading.   Recently seen in ED for flank pain, knee pain continues-to schedule appointments with Dr. Erlinda Hong and Dr. Margarita Rana for follow up. Update 04/12/21:  Patient states she is checking blood sugar twice a day and checking blood pressure daily.  Patient complains of knee pain today. Update 06/01/21:  Patient states she is not checking blood sugars or blood pressure. Transportation issues, patient utilized [facility] transportation and was told no available drivers. Update 04/12/21:  Patient has [place] as a resource as well. Currently UNABLE TO independently self manage needs related to chronic health conditions.  Knowledge Deficits related to short term plan for care coordination needs and long term plans for chronic disease management needs Nurse Case Manager Clinical Goal(s):  patient will work with care management team to address care coordination and chronic disease management needs related to Disease Management Educational Needs Care Coordination Medication Management and Education Medication Reconciliation Medication Assistance  Psychosocial Support   Interventions:  Evaluation of current treatment plan and patient's adherence to plan as established by provider. Reviewed medications with patient. Collaborated with pharmacy regarding medications. Discussed plans with patient for ongoing care management follow up and provided patient with direct contact information for care management team Advised patient, providing education and rationale, to monitor blood pressure daily and record, calling provider for findings outside established parameters.  Reviewed scheduled/upcoming provider appointments. Update 04/12/21:  Patient to schedule appointment with Dr. Erlinda Hong for evaluation of knee pain. Update 06/01/21:  Patient has not scheduled a follow up appointment yet. Advised patient, providing education and rationale, to check cbg and record, calling provider for findings outside established parameters.    Care guide referral for financial resources for rent, medicines-07/02/21-completed-patient provided resources. Care Guide referral for transportation. Update 04/12/21:  RNCM provided transportation resources to patient. Pharmacy referral for medication review. Update 04/12/21:  Patient has met with Pharmacist and continues to follow. Self Care Activities:  Patient will self administer medications as prescribed Patient will attend all scheduled provider appointments Patient will call pharmacy for medication refills Patient will continue to perform ADL's independently Patient will call provider office for new concerns or questions Patient Goals: In the next 30 days, patient will check blood pressure and blood sugars as directed by provider. In the next 30 days, patient will attend all scheduled appointments. In the next 30 days, patient will meet with Pharmacist to discuss medications.- In the next 30 days, patient will schedule appointment with Dr. Erlinda Hong and Dr. Margarita Rana. Follow Up Plan: The patient has been provided with contact information for the care management team and has been advised to call with any health related questions or concerns.  The care management team will reach out to the patient again over the next 30 days.     Evidence-based guidance:   Review biopsychosocial determinants of health screens.  Review need for preventive screening based on age, sex, family history and health history.  Determine level of  modifiable health risk. .  Discuss identified risks.  Identify areas where behavior change may lead to improved health.  Promote healthy lifestyle.  Evoke change talk using open-ended questions, pros and cons, as well as looking forward.  Identify and manage conditions or preconditions to reduce health risk.  Implement additional goals and interventions based on identified risk factors.

## 2021-07-07 ENCOUNTER — Ambulatory Visit: Payer: Self-pay | Admitting: *Deleted

## 2021-07-07 NOTE — Telephone Encounter (Signed)
Reason for Disposition  MODERATE-SEVERE itching (i.e., interferes with school, work, or sleep)    C/o vaginal itching and odor.   No discharge. She is going to call and make transportation arrangements.  Answer Assessment - Initial Assessment Questions 1. SYMPTOM: "What's the main symptom you're concerned about?" (e.g., pain, itching, dryness)     Vaginal infection that is itching and burning.   Having an odor  no discharge.  She doesn't have transportation when I suggested going to the urgent care.   There are no appts available at [facility] and she doesn't want to go to the ED for a vaginal infection.   I asked if she had tried any OTC medications for the vaginal infection and she replied,   "I don't have the money for that".     She mentioned her feet are swollen too.   She then mentioned that a nurse named Terri had given her the information on getting transportation if she needed it and she would call and see about scheduling transportation with that information.   She thanked me and hung up. Triage ended here. 2. LOCATION: "Where is the  *No Answer* located?" (e.g., inside/outside, left/right)     *No Answer* 3. ONSET: "When did the  *No Answer*  start?"     *No Answer* 4. PAIN: "Is there any pain?" If Yes, ask: "How bad is it?" (Scale: 1-10; mild, moderate, severe)     *No Answer* 5. ITCHING: "Is there any itching?" If Yes, ask: "How bad is it?" (Scale: 1-10; mild, moderate, severe)     *No Answer* 6. CAUSE: "What do you think is causing the discharge?" "Have you had the same problem before? What happened then?"     *No Answer* 7. OTHER SYMPTOMS: "Do you have any other symptoms?" (e.g., fever, itching, vaginal bleeding, pain with urination, injury to genital area, vaginal foreign body)     *No Answer* 8. PREGNANCY: "Is there any chance you are pregnant?" "When was your last menstrual period?"     *No Answer*  Protocols used: Vaginal Symptoms-A-AH

## 2021-07-07 NOTE — Telephone Encounter (Signed)
Schedule appt at [facility] for patient.  She will try to arrange transportation between now and then.

## 2021-07-07 NOTE — Telephone Encounter (Signed)
I returned pt's call.   She had called in c/o vaginal yeast infection and her feet swelling.  There are no appts available at [facility].   I suggested she try some OTC medications for vaginal infections.   She hasn't tried anything.   She replied,   "I don't have any money for that".  When I suggested she go to the urgent care since she did not want to go to the ED for this problem she responded,   "I don't have any transportation".   I asked if she had a friend, etc that could take her to the urgent care as she was willing to go, but she said every one around her there had "mental problems".  I was going to suggest the [facility] transportation service however she told me she had gotten some information for transportation services from a nurse named Terri and that she was going to call that number and work out a ride that way.   I let her know that was a good idea that she really needed to see a provider for the vaginal infection and her feet swelling.   She thanked me and hung up.  (Looking in her chart it looks like she was given information from High Risk Managed Medicaid with transportation information in it with a phone number included).

## 2021-07-08 ENCOUNTER — Ambulatory Visit: Payer: Medicaid Other | Admitting: Nurse Practitioner

## 2021-07-14 ENCOUNTER — Ambulatory Visit: Payer: Medicaid Other

## 2021-07-17 ENCOUNTER — Other Ambulatory Visit: Payer: Self-pay | Admitting: Family Medicine

## 2021-07-17 DIAGNOSIS — R7303 Prediabetes: Secondary | ICD-10-CM

## 2021-07-17 NOTE — Telephone Encounter (Signed)
Requested medication (s) are due for refill today: yes  Requested medication (s) are on the active medication list: yes  Last refill: 05/26/21  #60  0 refills  Future visit scheduled no  Notes to clinic: Pt has multiple No Shows and cancelled appts. Please review.  Requested Prescriptions  Pending Prescriptions Disp Refills   metFORMIN (GLUCOPHAGE) 500 MG tablet [Pharmacy Med Name: METFORMIN HCL 500 MG TABLET] 60 tablet 0    Sig: TAKE 1 TABLET BY MOUTH 2 TIMES DAILY WITH A MEAL.     Endocrinology:  Diabetes - Biguanides Passed - 07/17/2021  8:39 AM      Passed - Cr in normal range and within 360 days    Creatinine, Ser  Date Value Ref Range Status  06/17/2021 0.97 0.44 - 1.00 mg/dL Final          Passed - HBA1C is between 0 and 7.9 and within 180 days    Hgb A1c MFr Bld  Date Value Ref Range Status  03/31/2021 5.9 (H) 4.8 - 5.6 % Final    Comment:    (NOTE)         Prediabetes: 5.7 - 6.4         Diabetes: >6.4         Glycemic control for adults with diabetes: <7.0           Passed - AA eGFR in normal range and within 360 days    GFR calc Af Amer  Date Value Ref Range Status  12/02/2020 84 >59 mL/min/1.73 Final    Comment:    **In accordance with recommendations from the NKF-ASN Task force,**   [facility] is in the process of updating its eGFR calculation to the   2021 CKD-EPI creatinine equation that estimates kidney function   without a race variable.    GFR, Estimated  Date Value Ref Range Status  06/17/2021 >60 >60 mL/min Final    Comment:    (NOTE) Calculated using the CKD-EPI Creatinine Equation (2021)           Passed - Valid encounter within last 6 months    Recent Outpatient Visits           3 months ago Essential hypertension   [place] Elsie Stain, MD   3 months ago Mixed stress and urge urinary incontinence   [place], [name], MD   7 months ago Annual physical exam    [place], [name], MD   1 year ago Acute midline low back pain without sciatica   [place], [name], MD   2 years ago Vaginal candidiasis   [facility] [name], MD

## 2021-07-20 ENCOUNTER — Ambulatory Visit: Payer: Self-pay

## 2021-07-21 ENCOUNTER — Telehealth: Payer: Self-pay | Admitting: Pharmacist

## 2021-07-21 ENCOUNTER — Ambulatory Visit: Payer: Self-pay

## 2021-07-21 NOTE — Patient Outreach (Signed)
  Care Management   Follow Up Note   07/20/21 Name: Kathleen Rodriguez MRN: [id number] DOB: 07-09-1966   Referred by: Hoy Register, MD Reason for referral : No chief complaint on file.   Successful contact was made with patient to discuss care management and care coordination services. Patient requested a call back to re-schedule.  Follow Up Plan: The care management team will reach out to the patient again over the next 10 days.   Cheral Almas PharmD, CPP High Risk Managed Medicaid [place] [phone]

## 2021-07-28 ENCOUNTER — Telehealth: Payer: Self-pay | Admitting: Family Medicine

## 2021-07-28 NOTE — Telephone Encounter (Signed)
  Medicaid Managed Care   Unsuccessful Outreach Note  07/28/2021 Name: Kathleen Rodriguez MRN: [id number] DOB: 05/05/1966  Referred by: Hoy Register, MD Reason for referral : High Risk Managed Medicaid (I called the patient today to get her phone visit with the [facility] pharmacist rescheduled. Person that answered said she was not home and hung up.)   An unsuccessful telephone outreach was attempted today. The patient was referred to the case management team for assistance with care management and care coordination.   Follow Up Plan: The care management team will reach out to the patient again over the next 7 days.   Weston Settle Care Guide, High Risk Medicaid Managed Care Embedded Care Coordination [facility]  [facility]

## 2021-07-30 ENCOUNTER — Other Ambulatory Visit: Payer: Self-pay | Admitting: Obstetrics and Gynecology

## 2021-07-30 ENCOUNTER — Other Ambulatory Visit: Payer: Self-pay

## 2021-07-30 NOTE — Patient Instructions (Signed)
Hi Kathleen Rodriguez, thank you for speaking with me today, have a nice afternoon.  Kathleen Rodriguez was given information about Medicaid Managed Care team care coordination services as a part of their [place] Medicaid benefit. [name] verbally consented to engagement with the [facility] Managed Care team.   If you are experiencing a medical emergency, please call 911 or report to your local emergency department or urgent care.   If you have a non-emergency medical problem during routine business hours, please contact your provider's office and ask to speak with a nurse.   For questions related to your [facility], please call: [phone] or visit the homepage here: [email or web address]  If you would like to schedule transportation through your [facility], please call the following number at least 2 days in advance of your appointment: [phone].   Call the [place] at [phone], at any time, 24 hours a day, 7 days a week. If you are in danger or need immediate medical attention call 911.  If you would like help to quit smoking, call 1-800-QUIT-NOW 863-873-6809) OR Espaol: 1-855-Djelo-Ya (9-292-446-2863) o para ms informacin haga clic aqu or Text READY to 200-400 to register via text  Kathleen Rodriguez - following are the goals we discussed in your visit today:   Goals Addressed             This Visit's Progress    Protect My Health       Timeframe:  Long-Range Goal Priority:  High Start Date:        02/24/21                     Expected End Date:       ongoing            Follow Up Date: 08/30/21   - schedule appointment for flu shot - schedule appointment for vaccines needed due to my age or health - schedule recommended health tests (blood work, mammogram, colonoscopy, pap test) - schedule and keep appointment for annual check-up    Update 04/12/21:  Patient attending all appointments. Update 06/01/21:  Patient has appt 06/21/21 with [facility] [employer] about diabetic shoes and 06/22/21 with dietican. Update 07/02/21:  Patient to schedule appointments with Dr. Erlinda Hong and Dr. Margarita Rana for follow up.  Update 08/30/21:  Patient has not scheduled any follow up appointments yet.  Missed nutritionist appointment.  Encouraged patient to schedule appointments when she can.  Why is this important?   Screening tests can find diseases early when they are easier to treat.  Your doctor or nurse will talk with you about which tests are important for you.  Getting shots for common diseases like the flu and shingles will help prevent them.            The patient verbalized understanding of instructions provided today and declined a print copy of patient instruction materials.   The Managed Medicaid care management team will reach out to the patient again over the next 30 days.  The  Patient has been provided with contact information for the Managed Medicaid care management team and has been advised to call with any health related questions or concerns.   [name] RN, BSN [place]  [facility] - Managed Medicaid High Risk [phone].    Following is a copy of your plan of care:    Problem Identified: Health Promotion or  Disease Self-Management (General Plan of Care)   Priority: High  Onset Date: 02/24/2021     Long-Range Goal: Self-Management Plan Developed   Start Date: 02/24/2021  Expected End Date: 09/01/2021  Recent Progress: Not on track  Priority: High  Note:   Current Barriers:  Ineffective Self Health Maintenance-patient not checking blood sugars-needs to pick new glucometer up at pharmacy.  Also, checking blood pressure irregularly. Patient states she is struggling to pay for rent and medicines. Update 07/02/21:  Patient states she is checking blood sugar daily and blood pressure  every 3-4 days.  Patient states blood sugar 120s and blood pressure "good"-could not recall reading.  Recently seen in ED for flank pain, knee pain continues-to schedule appointments with Dr. Erlinda Hong and Dr. Margarita Rana for follow up. Update 04/12/21:  Patient states she is checking blood sugar twice a day and checking blood pressure daily.  Patient complains of knee pain today. Update 06/01/21:  Patient states she is not checking blood sugars or blood pressure. Update 07/30/21:  patient not checking blood pressure, blood sugar.  Needs to schedule follow up appointments with providers. Transportation issues, patient utilized [facility] transportation and was told no available drivers. Update 04/12/21:  Patient has [place] as a resource as well. Currently UNABLE TO independently self manage needs related to chronic health conditions.  Knowledge Deficits related to short term plan for care coordination needs and long term plans for chronic disease management needs Nurse Case Manager Clinical Goal(s):  patient will work with care management team to address care coordination and chronic disease management needs related to Disease Management Educational Needs Care Coordination Medication Management and Education Medication Reconciliation Medication Assistance  Psychosocial Support   Interventions:  Evaluation of current treatment plan and patient's adherence to plan as established by provider. Reviewed medications with patient. Collaborated with pharmacy regarding medications. Discussed plans with patient for ongoing care management follow up and provided patient with direct contact information for care management team Advised patient, providing education and rationale, to monitor blood pressure daily and record, calling provider for findings outside established parameters.  Reviewed scheduled/upcoming provider appointments. Update 04/12/21:  Patient to schedule appointment with Dr. Erlinda Hong for evaluation  of knee pain. Update 06/01/21:  Patient has not scheduled a follow up appointment yet. Advised patient, providing education and rationale, to check cbg and record, calling provider for findings outside established parameters.   Care guide referral for financial resources for rent, medicines-07/02/21-completed-patient provided resources. Care Guide referral for transportation. Update 04/12/21:  RNCM provided transportation resources to patient. Pharmacy referral for medication review. Update 04/12/21:  Patient has met with Pharmacist and continues to follow. Self Care Activities:  Patient will self administer medications as prescribed Patient will attend all scheduled provider appointments Patient will call pharmacy for medication refills Patient will continue to perform ADL's independently Patient will call provider office for new concerns or questions Patient Goals: In the next 30 days, patient will check blood pressure and blood sugars as directed by provider. In the next 30 days, patient will attend all scheduled appointments. In the next 30 days, patient will meet with Pharmacist to discuss medications.- In the next 30 days, patient will schedule appointment with Dr. Erlinda Hong and Dr. Margarita Rana. Follow Up Plan: The patient has been provided with contact information for the care management team and has been advised to call with any health related questions or concerns.  The care management team will reach out to the patient again over the next 30 days.  Evidence-based guidance:   Review biopsychosocial determinants of health screens.  Review need for preventive screening based on age, sex, family history and health history.  Determine level of modifiable health risk. .  Discuss identified risks.  Identify areas where behavior change may lead to improved health.  Promote healthy lifestyle.  Evoke change talk using open-ended questions, pros and cons, as well as looking forward.  Identify and  manage conditions or preconditions to reduce health risk.  Implement additional goals and interventions based on identified risk factors.

## 2021-07-30 NOTE — Patient Outreach (Signed)
Medicaid Managed Care   Nurse Care Manager Note  07/30/2021 Name:  Kathleen Rodriguez MRN:  [id number] DOB:  [birth year]  Kathleen Rodriguez is an 55 y.o. year old female who is a primary patient of Kathleen Rakes, MD.  The [facility] Managed Care Coordination team was consulted for assistance with:    Chronic healthcare management needs.  Ms. Uriegas was given information about Medicaid Managed Care Coordination team services today. Kathleen Rodriguez Patient agreed to services and verbal consent obtained.  Engaged with patient by telephone for follow up visit in response to provider referral for case management and/or care coordination services.   Assessments/Interventions:  Review of past medical history, allergies, medications, health status, including review of consultants reports, laboratory and other test data, was performed as part of comprehensive evaluation and provision of chronic care management services.  SDOH (Social Determinants of Health) assessments and interventions performed: SDOH Interventions    Flowsheet Row Most Recent Value  SDOH Interventions   Intimate Partner Violence Interventions Intervention Not Indicated  Transportation Interventions [facility], Other (Comment)  [insurance transportation]       Care Plan  Allergies  Allergen Reactions   Tomato Hives, Itching and Other (See Comments)    ALSO REACTS TO KETCHUP   Latex Itching and Rash   Latex Hives and Rash   Tomato Hives and Rash   Wool Alcohol [Lanolin] Itching    Medications Reviewed Today     Reviewed by [name], RN (Registered Nurse) on 07/30/21 at 1248  Med List Status: <None>   Medication Order Taking? Sig Documenting Provider Last Dose Status Informant  Accu-Chek Softclix Lancets lancets [id number]  Use to check blood sugar once daily. Kathleen Rakes, MD  Active   albuterol (PROAIR HFA) 108 (90 Base) MCG/ACT inhaler [id number]  TAKE 2 PUFFS BY MOUTH EVERY 6 HOURS AS NEEDED FOR  WHEEZE OR SHORTNESS OF Cipriano Mile, MD  Active   albuterol (PROVENTIL) (2.5 MG/3ML) 0.083% nebulizer solution [id number]  Take 3 mLs (2.5 mg total) by nebulization every 6 (six) hours as needed for shortness of breath or wheezing. Shawna Clamp, MD  Active   aspirin 81 MG EC tablet [id number] Yes Take 1 tablet (81 mg total) by mouth daily. Kathleen Rakes, MD Taking Active            Med Note (Alisea Matte, Lorel Monaco   Fri Jul 30, 2021 12:47 PM)    Blood Glucose Monitoring Suppl (ACCU-CHEK GUIDE ME) w/Device KIT [id number]  Use to check blood sugar once daily. Kathleen Rakes, MD  Active   budesonide (PULMICORT) 0.25 MG/2ML nebulizer solution [id number]  TAKE 1 VIAL (0.25 MG TOTAL) BY NEBULIZATION 2 (TWO) TIMES DAILY. Shawna Clamp, MD  Expired 05/02/21 2359   cloNIDine (CATAPRES) 0.2 MG tablet [id number] No Take 0.2 mg by mouth 2 (two) times daily.  Patient not taking: Reported on 07/30/2021   [provider] Not Taking Active   EPINEPHrine 0.3 mg/0.3 mL IJ SOAJ injection [id number]  Inject 0.3 mg into the muscle as needed for anaphylaxis. Chesley Mires, MD  Active   fluticasone [facility]) 50 MCG/ACT nasal spray [id number]  Place 1 spray into both nostrils daily as needed for allergies. Chesley Mires, MD  Active   fluticasone-salmeterol (ADVAIR [facility]) [phone] MCG/ACT inhaler [id number]  Inhale 2 puffs into the lungs 2 (two) times daily. Chesley Mires, MD  Active   glucose blood (ACCU-CHEK GUIDE) test strip [id number]  Use as instructed daily before breakfast. Kathleen Rakes, MD  Active   metFORMIN (GLUCOPHAGE) 500 MG tablet [id number]  TAKE 1 TABLET BY MOUTH 2 TIMES DAILY WITH A MEAL. Kathleen Rakes, MD  Active   methocarbamol (ROBAXIN) 500 MG tablet [id number] Yes TAKE 1 TABLET (500 MG TOTAL) BY MOUTH EVERY 8 (EIGHT) HOURS AS NEEDED FOR MUSCLE SPASMS. Elsie Stain, MD Taking Active   Misc. Devices MISC [id number]  Blood pressure monitor  Dx: Hypertension Kathleen Rakes, MD  Active Self  Misc.  Devices [place] [id number]  Depends . Wt- 250 lbs, size 1X; [place] pads Kathleen Rakes, MD  Active Self  Multiple Vitamin (MULTIVITAMIN WITH MINERALS) TABS tablet [id number] Yes Take 1 tablet by mouth daily. [provider] Taking Active   oxyCODONE-acetaminophen (PERCOCET/ROXICET) 5-325 MG tablet [id number]  Take 1 tablet by mouth every 6 (six) hours as needed for up to 8 doses for severe pain. Luna Fuse, MD  Active             Patient Active Problem List   Diagnosis Date Noted   Diabetes mellitus without complication ([place]) 51/11/5850   OSA (obstructive sleep apnea) 01/20/2021   Essential hypertension 01/20/2021   Obesity (BMI 30-39.9) 01/20/2021   Acute asthma exacerbation 10/09/2019   COPD mixed type (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   Primary osteoarthritis of left knee 03/09/2016   Anemia, iron deficiency 08/12/2015   Dysfunctional uterine bleeding 12/28/2014   Morbid obesity ([place]) 05/16/2014   Upper airway cough syndrome 05/01/2014   GERD (gastroesophageal reflux disease) 04/08/2014   OSA (obstructive sleep apnea) 03/20/2013    Conditions to be addressed/monitored per PCP order:   chronic healthcare management needs, OSA, GERD, obesity, DUB, osteoarthritis,  asthma, COPD, HTN, DM  Care Plan : General Plan of Care (Adult)  Updates made by [name], RN since 07/30/2021 12:00 AM     Problem: Health Promotion or Disease Self-Management (General Plan of Care)   Priority: High  Onset Date: 02/24/2021     Long-Range Goal: Self-Management Plan Developed   Start Date: 02/24/2021  Expected End Date: 09/01/2021  Recent Progress: Not on track  Priority: High  Note:   Current Barriers:  Ineffective Self Health Maintenance-patient not checking blood sugars-needs to pick new glucometer up at pharmacy.  Also, checking blood pressure  irregularly. Patient states she is struggling to pay for rent and medicines. Update 07/02/21:  Patient states she is checking blood sugar daily and blood pressure every 3-4 days.  Patient states blood sugar 120s and blood pressure "good"-could not recall reading.  Recently seen in ED for flank pain, knee pain continues-to schedule appointments with Dr. Erlinda Hong and Dr. Margarita Rana for follow up. Update 04/12/21:  Patient states she is checking blood sugar twice a day and checking blood pressure daily.  Patient complains of knee pain today. Update 06/01/21:  Patient states she is not checking blood sugars or blood pressure. Update 07/30/21:  patient not checking blood pressure, blood sugar.  Needs to schedule follow up appointments with providers. Transportation issues, patient utilized [facility] transportation and was told no available drivers. Update 04/12/21:  Patient has [place] as a resource as well. Currently UNABLE TO independently self manage needs related to chronic health conditions.  Knowledge Deficits related to short term plan for care coordination needs and long term plans for chronic disease management needs Nurse Case Manager Clinical Goal(s):  patient will work  with care management team to address care coordination and chronic disease management needs related to Disease Management Educational Needs Care Coordination Medication Management and Education Medication Reconciliation Medication Assistance  Psychosocial Support   Interventions:  Evaluation of current treatment plan and patient's adherence to plan as established by provider. Reviewed medications with patient. Collaborated with pharmacy regarding medications. Discussed plans with patient for ongoing care management follow up and provided patient with direct contact information for care management team Advised patient, providing education and rationale, to monitor blood pressure daily and record, calling provider for findings  outside established parameters.  Reviewed scheduled/upcoming provider appointments. Update 04/12/21:  Patient to schedule appointment with Dr. Erlinda Hong for evaluation of knee pain. Update 06/01/21:  Patient has not scheduled a follow up appointment yet. Advised patient, providing education and rationale, to check cbg and record, calling provider for findings outside established parameters.   Care guide referral for financial resources for rent, medicines-07/02/21-completed-patient provided resources. Care Guide referral for transportation. Update 04/12/21:  RNCM provided transportation resources to patient. Pharmacy referral for medication review. Update 04/12/21:  Patient has met with Pharmacist and continues to follow. Self Care Activities:  Patient will self administer medications as prescribed Patient will attend all scheduled provider appointments Patient will call pharmacy for medication refills Patient will continue to perform ADL's independently Patient will call provider office for new concerns or questions Patient Goals: In the next 30 days, patient will check blood pressure and blood sugars as directed by provider. In the next 30 days, patient will attend all scheduled appointments. In the next 30 days, patient will meet with Pharmacist to discuss medications.- In the next 30 days, patient will schedule appointment with Dr. Erlinda Hong and Dr. Margarita Rana. Follow Up Plan: The patient has been provided with contact information for the care management team and has been advised to call with any health related questions or concerns.  The care management team will reach out to the patient again over the next 30 days.     Evidence-based guidance:   Review biopsychosocial determinants of health screens.  Review need for preventive screening based on age, sex, family history and health history.  Determine level of modifiable health risk. .  Discuss identified risks.  Identify areas where behavior change may  lead to improved health.  Promote healthy lifestyle.  Evoke change talk using open-ended questions, pros and cons, as well as looking forward.  Identify and manage conditions or preconditions to reduce health risk.  Implement additional goals and interventions based on identified risk factors.      Follow Up:  Patient agrees to Care Plan and Follow-up.  Plan: The Managed Medicaid care management team will reach out to the patient again over the next 30 days. and The  Patient has been provided with contact information for the Managed Medicaid care management team and has been advised to call with any health related questions or concerns.  Date/time of next scheduled RN care management/care coordination outreach:  08/30/21 at 1245

## 2021-08-03 ENCOUNTER — Telehealth: Payer: Self-pay | Admitting: Family Medicine

## 2021-08-03 NOTE — Telephone Encounter (Signed)
..   Medicaid Managed Care   Unsuccessful Outreach Note  08/03/2021 Name: Kathleen Rodriguez MRN: [id number] DOB: 10-21-1966  Referred by: Hoy Register, MD Reason for referral : High Risk Managed Medicaid (I called the patient today to get her phone visit with the [facility] Pharmacist rescheduled. The person that answered the phone said she was not home.)   A second unsuccessful telephone outreach was attempted today. The patient was referred to the case management team for assistance with care management and care coordination.   Follow Up Plan: The care management team will reach out to the patient again over the next 7 days.   Weston Settle Care Guide, High Risk Medicaid Managed Care Embedded Care Coordination [facility]  [facility]

## 2021-08-09 ENCOUNTER — Telehealth: Payer: Self-pay | Admitting: Family Medicine

## 2021-08-09 NOTE — Telephone Encounter (Signed)
..   Medicaid Managed Care   Unsuccessful Outreach Note  08/09/2021 Name: Kathleen Rodriguez MRN: [id number] DOB: [birth year]  Referred by: Hoy Register, MD Reason for referral : High Risk Managed Medicaid (I called the patient today to get her scheduled for a phone visit with the [facility] Pharmacist. I left my name and number on her VM.)   Third unsuccessful telephone outreach was attempted today. The patient was referred to the case management team for assistance with care management and care coordination. The patient's primary care provider has been notified of our unsuccessful attempts to make or maintain contact with the patient. The care management team is pleased to engage with this patient at any time in the future should he/she be interested in assistance from the care management team.   Follow Up Plan: We have been unable to make contact with the patient for follow up. The care management team is available to follow up with the patient after provider conversation with the patient regarding recommendation for care management engagement and subsequent re-referral to the care management team.   Weston Settle Care Guide, High Risk Medicaid Managed Care Embedded Care Coordination [facility]  [facility]

## 2021-08-27 ENCOUNTER — Ambulatory Visit: Payer: Medicaid Other | Admitting: Primary Care

## 2021-08-27 NOTE — Progress Notes (Deleted)
'@Patient'  ID: [name], female    DOB: [birth year], 55 y.o.   MRN: [id number]  No chief complaint on file.   Referring provider: [name], MD  HPI: 55 year old female, former smoker. PMH significant for severe persistent asthma with eosinophilic phenotype, OSA not currently using CPAP, polysubstance abuse. Patient of Dr. Halford Chessman, last seen by pulmonary NP on 10/29/20.  Maintained on BREO, Singulair, Claritin and prn albuterol.    08/27/2021 Patient presents today for overdue follow-up/medication management. During last visit paperwork was started to have her resume [place]. She is currently on Advair HFA 230 two puffs BID.    Advair?  Pulmicort ?  Singulair?  SABA   Allergies  Allergen Reactions   Tomato Hives, Itching and Other (See Comments)    ALSO REACTS TO KETCHUP   Latex Itching and Rash   Latex Hives and Rash   Tomato Hives and Rash   Wool Alcohol [Lanolin] Itching    Immunization History  Administered Date(s) Administered   Influenza,inj,Quad PF,6+ Mos 08/13/2013, 07/27/2015, 07/21/2016, 10/22/2017, 08/01/2018, 06/17/2019, 10/29/2020   Moderna Sars-Covid-2 Vaccination 12/17/2019, 01/21/2021   Pneumococcal Polysaccharide-23 06/17/2019   Tdap 08/14/2013   Zoster Recombinat (Shingrix) 12/02/2020, 03/18/2021    Past Medical History:  Diagnosis Date   Arthritis    "knees, lower back; legs, ankles" (01/27/2016)   Asthma    followed by Dr. 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   Diabetes mellitus without complication ([place])    Dyspnea    GERD (gastroesophageal reflux disease)    Hypertension    "doctor took me off RX in 2016" (01/27/2016)   Hypertension    Influenza B April 7408   Complicated by multi-organ failure   OSA on CPAP "since " 03/20/2013   Pneumonia 2016   Required emergent intubation    asthma exacerbation in 2016   Skin ulcer  ([place]) secondary to bullous impetigo /trauma 08/29/2018   Sleep apnea    Tobacco abuse    Upper airway cough syndrome     Tobacco History: Social History   Tobacco Use  Smoking Status Former   Packs/day: 0.25   Years: 21.00   Pack years: 5.25   Types: Cigarettes   Start date: 1993   Quit date: 01/31/2013   Years since quitting: 8.5  Smokeless Tobacco Never   Counseling given: Not Answered   Outpatient Medications Prior to Visit  Medication Sig Dispense Refill   Accu-Chek Softclix Lancets lancets Use to check blood sugar once daily. 100 each 2   albuterol (PROAIR HFA) 108 (90 Base) MCG/ACT inhaler TAKE 2 PUFFS BY MOUTH EVERY 6 HOURS AS NEEDED FOR WHEEZE OR SHORTNESS OF BREATH 8.5 each 3   albuterol (PROVENTIL) (2.5 MG/3ML) 0.083% nebulizer solution Take 3 mLs (2.5 mg total) by nebulization every 6 (six) hours as needed for shortness of breath or wheezing. 75 mL 12   aspirin 81 MG EC tablet Take 1 tablet (81 mg total) by mouth daily. 30 tablet 3   Blood Glucose Monitoring Suppl (ACCU-CHEK GUIDE ME) w/Device KIT Use to check blood sugar once daily. 1 kit 0   budesonide (PULMICORT) 0.25 MG/2ML nebulizer solution TAKE 1 VIAL (0.25 MG TOTAL) BY NEBULIZATION 2 (TWO) TIMES DAILY. 60 mL 12   cloNIDine (CATAPRES) 0.2 MG tablet Take 0.2 mg by mouth 2 (two) times daily. (  Patient not taking: Reported on 07/30/2021)     EPINEPHrine 0.3 mg/0.3 mL IJ SOAJ injection Inject 0.3 mg into the muscle as needed for anaphylaxis. 1 each 2   fluticasone (FLONASE) 50 MCG/ACT nasal spray Place 1 spray into both nostrils daily as needed for allergies. 16 g 11   fluticasone-salmeterol (ADVAIR HFA) 230-21 MCG/ACT inhaler Inhale 2 puffs into the lungs 2 (two) times daily. 12 g 6   glucose blood (ACCU-CHEK GUIDE) test strip Use as instructed daily before breakfast. 100 each 2   metFORMIN (GLUCOPHAGE) 500 MG tablet TAKE 1 TABLET BY MOUTH 2 TIMES DAILY WITH A MEAL. 60 tablet 0   methocarbamol (ROBAXIN) 500 MG tablet  TAKE 1 TABLET (500 MG TOTAL) BY MOUTH EVERY 8 (EIGHT) HOURS AS NEEDED FOR MUSCLE SPASMS. 60 tablet 1   Misc. Devices MISC Blood pressure monitor  Dx: Hypertension 1 each 0   Misc. Devices MISC Depends . Wt- 250 lbs, size 1X; Poise pads 3 each 11   Multiple Vitamin (MULTIVITAMIN WITH MINERALS) TABS tablet Take 1 tablet by mouth daily.     oxyCODONE-acetaminophen (PERCOCET/ROXICET) 5-325 MG tablet Take 1 tablet by mouth every 6 (six) hours as needed for up to 8 doses for severe pain. 8 tablet 0   No facility-administered medications prior to visit.      Review of Systems  Review of Systems   Physical Exam  There were no vitals taken for this visit. Physical Exam   Lab Results:  CBC    Component Value Date/Time   WBC 9.1 06/17/2021 1401   RBC 5.30 (H) 06/17/2021 1401   HGB 15.3 (H) 06/17/2021 1401   HGB 14.8 12/02/2020 1143   HCT 47.7 (H) 06/17/2021 1401   HCT 46.9 (H) 12/02/2020 1143   PLT 331 06/17/2021 1401   PLT 318 12/02/2020 1143   MCV 90.0 06/17/2021 1401   MCV 87 12/02/2020 1143   MCH 28.9 06/17/2021 1401   MCHC 32.1 06/17/2021 1401   RDW 14.8 06/17/2021 1401   RDW 13.6 12/02/2020 1143   LYMPHSABS 2.6 06/17/2021 1401   LYMPHSABS 2.2 12/02/2020 1143   MONOABS 0.8 06/17/2021 1401   EOSABS 0.2 06/17/2021 1401   EOSABS 0.4 12/02/2020 1143   BASOSABS 0.1 06/17/2021 1401   BASOSABS 0.1 12/02/2020 1143    BMET    Component Value Date/Time   NA 138 06/17/2021 1401   NA 141 12/02/2020 1143   K 4.0 06/17/2021 1401   CL 104 06/17/2021 1401   CO2 25 06/17/2021 1401   GLUCOSE 98 06/17/2021 1401   BUN 8 06/17/2021 1401   BUN 14 12/02/2020 1143   CREATININE 0.97 06/17/2021 1401   CALCIUM 8.5 (L) 06/17/2021 1401   GFRNONAA >60 06/17/2021 1401   GFRAA 84 12/02/2020 1143    BNP    Component Value Date/Time   BNP 27.0 07/02/2020 2022    ProBNP    Component Value Date/Time   PROBNP 97.2 07/01/2014 1740    Imaging: No results found.   Assessment &  Plan:   No problem-specific Assessment & Plan notes found for this encounter.     Martyn Ehrich, NP 08/27/2021

## 2021-08-30 ENCOUNTER — Other Ambulatory Visit: Payer: Self-pay | Admitting: Obstetrics and Gynecology

## 2021-08-30 ENCOUNTER — Other Ambulatory Visit: Payer: Self-pay

## 2021-08-30 NOTE — Patient Instructions (Signed)
Hi Ms. Hubbs hope your appointment goes well tomorrow.  Ms. Aleshire was given information about Medicaid Managed Care team care coordination services as a part of their [place] Medicaid benefit. [name] verbally consented to engagement with the [facility] Managed Care team.   If you are experiencing a medical emergency, please call 911 or report to your local emergency department or urgent care.   If you have a non-emergency medical problem during routine business hours, please contact your provider's office and ask to speak with a nurse.   For questions related to your [facility], please call: [phone] or visit the homepage here: [email or web address]  If you would like to schedule transportation through your [facility], please call the following number at least 2 days in advance of your appointment: [phone].   Call the [place] at [phone], at any time, 24 hours a day, 7 days a week. If you are in danger or need immediate medical attention call 911.  If you would like help to quit smoking, call 1-800-QUIT-NOW 636-731-0370) OR Espaol: 1-855-Djelo-Ya (5-631-497-0263) o para ms informacin haga clic aqu or Text READY to 200-400 to register via text  Ms. Ronnald Ramp - following are the goals we discussed in your visit today:   Goals Addressed             This Visit's Progress    Protect My Health       Timeframe:  Long-Range Goal Priority:  High Start Date:        02/24/21                     Expected End Date:       ongoing            Follow Up Date: 09/29/21   - schedule appointment for flu shot - schedule appointment for vaccines needed due to my age or health - schedule recommended health tests (blood work, mammogram, colonoscopy, pap test) - schedule and keep appointment for annual check-up   Update 04/12/21:   Patient attending all appointments. Update 06/01/21:  Patient has appt 06/21/21 with [facility] about diabetic shoes and 06/22/21 with dietican. Update 07/02/21:  Patient to schedule appointments with Dr. Erlinda Hong and Dr. Margarita Rana for follow up.  Update 08/30/21:  Patient has not scheduled any follow up appointments yet.  Missed nutritionist appointment.  Encouraged patient to schedule appointments when she can. Update 08/30/21:  Patient has an appointment at [facility] tomorrow to have teeth pulled-has not scheduled any other appointments yet.  Why is this important?   Screening tests can find diseases early when they are easier to treat.  Your doctor or nurse will talk with you about which tests are important for you.  Getting shots for common diseases like the flu and shingles will help prevent them.      The patient verbalized understanding of instructions provided today and declined a print copy of patient instruction materials.   The Managed Medicaid care management team will reach out to the patient again over the next 30 days.  The  Patient  has been provided with contact information for the Managed Medicaid care management team and has been advised to call with any health related questions or concerns.   [name] RN, BSN [place]  [facility] - Managed Medicaid High Risk [phone].   Following is a copy of your  plan of care:  Care Plan : General Plan of Care (Adult)  Updates made by [name], RN since 08/30/2021 12:00 AM     Problem: Health Promotion or Disease Self-Management (General Plan of Care)   Priority: High  Onset Date: 02/24/2021     Long-Range Goal: Self-Management Plan Developed   Start Date: 02/24/2021  Expected End Date: 11/30/2021  Recent Progress: Not on track  Priority: High  Note:   Current Barriers:  Ineffective Self Health Maintenance-patient not checking blood sugars-needs to pick new glucometer up at  pharmacy.  Also, checking blood pressure irregularly. Patient states she is struggling to pay for rent and medicines. Update 07/02/21:  Patient states she is checking blood sugar daily and blood pressure every 3-4 days.  Patient states blood sugar 120s and blood pressure "good"-could not recall reading.  Recently seen in ED for flank pain, knee pain continues-to schedule appointments with Dr. Erlinda Hong and Dr. Margarita Rana for follow up. Update 04/12/21:  Patient states she is checking blood sugar twice a day and checking blood pressure daily.  Patient complains of knee pain today. Update 06/01/21:  Patient states she is not checking blood sugars or blood pressure. Update 07/30/21:  patient not checking blood pressure, blood sugar.  Needs to schedule follow up appointments with providers. Update 08/30/21:  No appointments scheduled thus far.  Has appointment 08/31/21 to have teeth removed at [facility]. Transportation issues, patient utilized [facility] transportation and was told no available drivers. Update 04/12/21:  Patient has [place] as a resource as well. Currently UNABLE TO independently self manage needs related to chronic health conditions.  Knowledge Deficits related to short term plan for care coordination needs and long term plans for chronic disease management needs Nurse Case Manager Clinical Goal(s):  patient will work with care management team to address care coordination and chronic disease management needs related to Disease Management Educational Needs Care Coordination Medication Management and Education Medication Reconciliation Medication Assistance  Psychosocial Support   Interventions:  Evaluation of current treatment plan and patient's adherence to plan as established by provider. Reviewed medications with patient. Collaborated with pharmacy regarding medications. Discussed plans with patient for ongoing care management follow up and provided patient with direct contact  information for care management team Advised patient, providing education and rationale, to monitor blood pressure daily and record, calling provider for findings outside established parameters.  Reviewed scheduled/upcoming provider appointments. Update 04/12/21:  Patient to schedule appointment with Dr. Erlinda Hong for evaluation of knee pain. Update 06/01/21:  Patient has not scheduled a follow up appointment yet. Advised patient, providing education and rationale, to check cbg and record, calling provider for findings outside established parameters.   Care guide referral for financial resources for rent, medicines-07/02/21-completed-patient provided resources. Care Guide referral for transportation. Update 04/12/21:  RNCM provided transportation resources to patient. Pharmacy referral for medication review. Update 04/12/21:  Patient has met with Pharmacist and continues to follow. Self Care Activities:  Patient will self administer medications as prescribed Patient will attend all scheduled provider appointments Patient will call pharmacy for medication refills Patient will continue to perform ADL's independently Patient will call provider office for new concerns or questions Patient Goals: In the next 30 days, patient will check blood pressure and blood sugars as directed by provider. In the next 30 days, patient will attend all scheduled appointments. In the next 30 days, patient will meet with Pharmacist to discuss medications.- In the next 30 days, patient will schedule appointment  with Dr. Erlinda Hong and Dr. Margarita Rana. Follow Up Plan: The patient has been provided with contact information for the care management team and has been advised to call with any health related questions or concerns.  The care management team will reach out to the patient again over the next 30 days.     Evidence-based guidance:   Review biopsychosocial determinants of health screens.  Review need for preventive screening based on  age, sex, family history and health history.  Determine level of modifiable health risk. .  Discuss identified risks.  Identify areas where behavior change may lead to improved health.  Promote healthy lifestyle.  Evoke change talk using open-ended questions, pros and cons, as well as looking forward.  Identify and manage conditions or preconditions to reduce health risk.  Implement additional goals and interventions based on identified risk factors.

## 2021-08-30 NOTE — Patient Outreach (Signed)
Medicaid Managed Care   Nurse Care Manager Note  08/30/2021 Name:  Kathleen Rodriguez MRN:  [id number] DOB:  [birth year]  [name] is an 55 y.o. year old female who is a primary patient of [name], MD.  The [facility] Managed Care Coordination team was consulted for assistance with:    Chronic healthcare management needs.  Ms. Foglio was given information about Medicaid Managed Care Coordination team services today. [name] Patient agreed to services and verbal consent obtained.  Engaged with patient by telephone for follow up visit in response to provider referral for case management and/or care coordination services.   Assessments/Interventions:  Review of past medical history, allergies, medications, health status, including review of consultants reports, laboratory and other test data, was performed as part of comprehensive evaluation and provision of chronic care management services.  SDOH (Social Determinants of Health) assessments and interventions performed: SDOH Interventions    Flowsheet Row Most Recent Value  SDOH Interventions   Food Insecurity Interventions Intervention Not Indicated       Care Plan  Allergies  Allergen Reactions   Tomato Hives, Itching and Other (See Comments)    ALSO REACTS TO KETCHUP   Latex Itching and Rash   Latex Hives and Rash   Tomato Hives and Rash   Wool Alcohol [Lanolin] Itching    Medications Reviewed Today     Reviewed by [name], RN (Registered Nurse) on 08/30/21 at 1305  Med List Status: <None>   Medication Order Taking? Sig Documenting Provider Last Dose Status Informant  Accu-Chek Softclix Lancets lancets [id number]  Use to check blood sugar once daily. [name], MD  Active   albuterol (PROAIR HFA) 108 (90 Base) MCG/ACT inhaler [id number] No TAKE 2 PUFFS BY MOUTH EVERY 6 HOURS AS NEEDED FOR WHEEZE OR SHORTNESS OF Cipriano Mile, MD Taking Active   albuterol (PROVENTIL) (2.5 MG/3ML) 0.083%  nebulizer solution [id number] No Take 3 mLs (2.5 mg total) by nebulization every 6 (six) hours as needed for shortness of breath or wheezing. Shawna Clamp, MD Taking Active   aspirin 81 MG EC tablet [id number] No Take 1 tablet (81 mg total) by mouth daily. [name], MD Taking Active            Med Note (Josie Mesa, Lorel Monaco   Fri Jul 30, 2021 12:47 PM)    Blood Glucose Monitoring Suppl (ACCU-CHEK GUIDE ME) w/Device KIT [id number] No Use to check blood sugar once daily. [name], MD Taking Active   budesonide (PULMICORT) 0.25 MG/2ML nebulizer solution [id number] No TAKE 1 VIAL (0.25 MG TOTAL) BY NEBULIZATION 2 (TWO) TIMES DAILY. Shawna Clamp, MD Taking Expired 05/02/21 2359   cloNIDine (CATAPRES) 0.2 MG tablet [id number] No Take 0.2 mg by mouth 2 (two) times daily.  Patient not taking: Reported on 07/30/2021   [provider] Not Taking Active   EPINEPHrine 0.3 mg/0.3 mL IJ SOAJ injection [id number]  Inject 0.3 mg into the muscle as needed for anaphylaxis. Chesley Mires, MD  Active   fluticasone [facility]) 50 MCG/ACT nasal spray [id number] No Place 1 spray into both nostrils daily as needed for allergies. Chesley Mires, MD Taking Active   fluticasone-salmeterol (ADVAIR HFA) [id number] MCG/ACT inhaler [id number] No Inhale 2 puffs into the lungs 2 (two) times daily. Chesley Mires, MD Taking Active   glucose blood (ACCU-CHEK GUIDE) test strip [id number]  Use as instructed daily before breakfast. [name], MD  Active   metFORMIN (GLUCOPHAGE) 500 MG tablet [id number] No TAKE  1 TABLET BY MOUTH 2 TIMES DAILY WITH A MEAL. Newlin, [name], MD Taking Active   methocarbamol (ROBAXIN) 500 MG tablet [id number] No TAKE 1 TABLET (500 MG TOTAL) BY MOUTH EVERY 8 (EIGHT) HOURS AS NEEDED FOR MUSCLE SPASMS. Wright, Patrick E, MD Taking Active   Misc. Devices MISC [id number] No Blood pressure monitor  Dx: Hypertension Newlin, [name], MD Taking Active Self  Misc. Devices MISC [id number] No Depends . Wt- 250 lbs,  size 1X; Poise pads Newlin, [name], MD Taking Active Self  Multiple Vitamin (MULTIVITAMIN WITH MINERALS) TABS tablet [id number] No Take 1 tablet by mouth daily. [provider] Taking Active   oxyCODONE-acetaminophen (PERCOCET/ROXICET) 5-325 MG tablet [id number]  Take 1 tablet by mouth every 6 (six) hours as needed for up to 8 doses for severe pain. Hong, Joshua S, MD  Active             Patient Active Problem List   Diagnosis Date Noted   Diabetes mellitus without complication (HCC) 01/20/2021   OSA (obstructive sleep apnea) 01/20/2021   Essential hypertension 01/20/2021   Obesity (BMI 30-39.9) 01/20/2021   Acute asthma exacerbation 10/09/2019   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   Asthma 10/22/2017   Osteoarthritis of left knee 09/01/2016   Total knee replacement status 09/01/2016   Osteoarthritis of knees, 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   Primary osteoarthritis of left knee 03/09/2016   Anemia, iron deficiency 08/12/2015   Dysfunctional uterine bleeding 12/28/2014   Morbid obesity (HCC) 05/16/2014   Upper airway cough syndrome 05/01/2014   GERD (gastroesophageal reflux disease) 04/08/2014   OSA (obstructive sleep apnea) 03/20/2013    Conditions to be addressed/monitored per PCP order:   chronic healthcare management needs, OSA, GERD, DUB, anemia, osteoarthritis, asthma, HTN, DM.  Care Plan : General Plan of Care (Adult)  Updates made by Craft, Terri G, RN since 08/30/2021 12:00 AM     Problem: Health Promotion or Disease Self-Management (General Plan of Care)   Priority: High  Onset Date: 02/24/2021     Long-Range Goal: Self-Management Plan Developed   Start Date: 02/24/2021  Expected End Date: 11/30/2021  Recent Progress: Not on track  Priority: High  Note:   Current Barriers:  Ineffective Self Health Maintenance-patient not checking blood sugars-needs to pick new glucometer up at pharmacy.  Also, checking blood pressure irregularly. Patient states she is struggling to pay  for rent and medicines. Update 07/02/21:  Patient states she is checking blood sugar daily and blood pressure every 3-4 days.  Patient states blood sugar 120s and blood pressure "good"-could not recall reading.  Recently seen in ED for flank pain, knee pain continues-to schedule appointments with Dr. Xu and Dr. Newlin for follow up. Update 04/12/21:  Patient states she is checking blood sugar twice a day and checking blood pressure daily.  Patient complains of knee pain today. Update 06/01/21:  Patient states she is not checking blood sugars or blood pressure. Update 07/30/21:  patient not checking blood pressure, blood sugar.  Needs to schedule follow up appointments with providers. Update 08/30/21:  No appointments scheduled thus far.  Has appointment 08/31/21 to have teeth removed at [facility]. Transportation issues, patient utilized [employer] transportation and was told no available drivers. Update 04/12/21:  Patient has [place] [employer] as a resource as well. Currently UNABLE TO independently self manage needs related to chronic health conditions.  Knowledge Deficits related to short term plan for care coordination needs and long term plans for chronic disease management needs Nurse Case   Manager Clinical Goal(s):  patient will work with care management team to address care coordination and chronic disease management needs related to Disease Management Educational Needs Care Coordination Medication Management and Education Medication Reconciliation Medication Assistance  Psychosocial Support   Interventions:  Evaluation of current treatment plan and patient's adherence to plan as established by provider. Reviewed medications with patient. Collaborated with pharmacy regarding medications. Discussed plans with patient for ongoing care management follow up and provided patient with direct contact information for care management team Advised patient, providing education and rationale, to  monitor blood pressure daily and record, calling provider for findings outside established parameters.  Reviewed scheduled/upcoming provider appointments. Update 04/12/21:  Patient to schedule appointment with Dr. Xu for evaluation of knee pain. Update 06/01/21:  Patient has not scheduled a follow up appointment yet. Advised patient, providing education and rationale, to check cbg and record, calling provider for findings outside established parameters.   Care guide referral for financial resources for rent, medicines-07/02/21-completed-patient provided resources. Care Guide referral for transportation. Update 04/12/21:  RNCM provided transportation resources to patient. Pharmacy referral for medication review. Update 04/12/21:  Patient has met with Pharmacist and continues to follow. Self Care Activities:  Patient will self administer medications as prescribed Patient will attend all scheduled provider appointments Patient will call pharmacy for medication refills Patient will continue to perform ADL's independently Patient will call provider office for new concerns or questions Patient Goals: In the next 30 days, patient will check blood pressure and blood sugars as directed by provider. In the next 30 days, patient will attend all scheduled appointments. In the next 30 days, patient will meet with Pharmacist to discuss medications.- In the next 30 days, patient will schedule appointment with Dr. Xu and Dr. Newlin. Follow Up Plan: The patient has been provided with contact information for the care management team and has been advised to call with any health related questions or concerns.  The care management team will reach out to the patient again over the next 30 days.    Evidence-based guidance:   Review biopsychosocial determinants of health screens.  Review need for preventive screening based on age, sex, family history and health history.  Determine level of modifiable health risk. .   Discuss identified risks.  Identify areas where behavior change may lead to improved health.  Promote healthy lifestyle.  Evoke change talk using open-ended questions, pros and cons, as well as looking forward.  Identify and manage conditions or preconditions to reduce health risk.  Implement additional goals and interventions based on identified risk factors.    Follow Up:  Patient agrees to Care Plan and Follow-up.  Plan: The Managed Medicaid care management team will reach out to the patient again over the next 30 days. and The  Patient has been provided with contact information for the Managed Medicaid care management team and has been advised to call with any health related questions or concerns.  Date/time of next scheduled RN care management/care coordination outreach:  09/08/21 at 130. 

## 2021-09-03 ENCOUNTER — Telehealth: Payer: Self-pay | Admitting: Family Medicine

## 2021-09-03 NOTE — Telephone Encounter (Signed)
..   Medicaid Managed Care   Unsuccessful Outreach Note  09/03/2021 Name: Kathleen Rodriguez MRN: [id number] DOB: August 25, 1966  Referred by: Hoy Register, MD Reason for referral : High Risk Managed Medicaid (I left a message for the patient to please return my call to get her appt rescheduled with the [facility] Pharmacist.)   An unsuccessful telephone outreach was attempted today. The patient was referred to the case management team for assistance with care management and care coordination.   Follow Up Plan: The care management team will reach out to the patient again over the next 7 days.   Weston Settle Care Guide, High Risk Medicaid Managed Care Embedded Care Coordination [facility]  [facility]

## 2021-09-08 ENCOUNTER — Other Ambulatory Visit: Payer: Self-pay | Admitting: Obstetrics and Gynecology

## 2021-09-08 NOTE — Patient Outreach (Signed)
Care Coordination  09/08/2021  Kathleen Rodriguez Nov 13, 1965 [id number]   Medicaid Managed Care   Unsuccessful Outreach Note  09/08/2021 Name: Kathleen Rodriguez MRN: [id number] DOB: [birth year]  Referred by: Hoy Register, MD Reason for referral : High Risk Managed Medicaid (Unsuccessful telephone outreach)   An unsuccessful telephone outreach was attempted today. The patient was referred to the case management team for assistance with care management and care coordination.   Follow Up Plan: The care management team will reach out to the patient again over the next 7-14 days.   [name] RN, BSN [place]  [facility] - Managed Medicaid High Risk [phone]

## 2021-09-08 NOTE — Patient Instructions (Signed)
Hi Ms. Proto, I am sorry I missed you today, I hope you are doing okay - as a part of your Medicaid benefit, you are eligible for care management and care coordination services at no cost or copay. I was unable to reach you by phone today but would be happy to help you with your health related needs. Please feel free to call me at [phone] ° °A member of the Managed Medicaid care management team will reach out to you again over the next 7-14 days.  ° °Seher Schlagel RN, BSN °[place]   [facility] °Care Management Coordinator - Managed Medicaid High Risk °[phone] °  °

## 2021-09-13 ENCOUNTER — Telehealth: Payer: Self-pay | Admitting: Family Medicine

## 2021-09-20 ENCOUNTER — Telehealth: Payer: Self-pay | Admitting: Pulmonary Disease

## 2021-09-20 MED ORDER — PREDNISONE 10 MG PO TABS: ORAL_TABLET | ORAL | 0 refills | Status: DC

## 2021-09-20 NOTE — Telephone Encounter (Signed)
Called and spoke with patient. She verbalized understanding. RX for prednisone has been sent.  Nothing further needed at time of call.

## 2021-09-20 NOTE — Telephone Encounter (Signed)
Spoke with the pt  She is c/o increased SOB and wheezing x 2 days She states has cough that is non prod  She denies fevers or aches  She has been out of her Breo and only using albuterol nebs  She recently had all of her teeth extracted and has not been able to use her CPAP or most of her meds  She missed her last dose of Fasenra due to family emergency She is scheduled for appt here 09/24/21 and is asking for pred rx to get her through until then  Offered sooner visit but she declined due to not wanting to go out into the windy weather  Please advise thanks!

## 2021-09-20 NOTE — Telephone Encounter (Signed)
Please send script for prednisone 10 mg pill >> 3 pills daily for 2 days, 2 pills daily for 2 days, 1 pill daily for 2 days. 

## 2021-09-21 ENCOUNTER — Ambulatory Visit: Payer: Medicaid Other | Admitting: Primary Care

## 2021-09-23 ENCOUNTER — Telehealth: Payer: Self-pay | Admitting: *Deleted

## 2021-09-23 NOTE — Telephone Encounter (Signed)
Kathleen Rodriguez is over 55 year old.  ATC patient x1.  LVM to return call and to bring SD card if she has one in her CPAP machine when she comes for her visit and if not, we need to know if she is wearing her CPAP machine or not.  Will await return call.

## 2021-09-24 ENCOUNTER — Ambulatory Visit: Payer: Medicaid Other | Admitting: Primary Care

## 2021-09-24 NOTE — Progress Notes (Deleted)
'@Patient'  ID: [name], female    DOB: July 21, 1966, 55 y.o.   MRN: [id number]  No chief complaint on file.   Referring provider: [name], MD  HPI: 55 year old female, former smoker. PMH significant for COPD mixed type, postinfectious related bronchiectasis, OSA, HTN, type 2 diabetes, obesity.   09/24/2021 Patient presents today for annual OV/med refill. She is maintained on Advair HFA 230-21.        Allergies  Allergen Reactions   Tomato Hives, Itching and Other (See Comments)    ALSO REACTS TO KETCHUP   Latex Itching and Rash   Latex Hives and Rash   Tomato Hives and Rash   Wool Alcohol [Lanolin] Itching    Immunization History  Administered Date(s) Administered   Influenza,inj,Quad PF,6+ Mos 08/13/2013, 07/27/2015, 07/21/2016, 10/22/2017, 08/01/2018, 06/17/2019, 10/29/2020   Moderna Sars-Covid-2 Vaccination 12/17/2019, 01/21/2021   Pneumococcal Polysaccharide-23 06/17/2019   Tdap 08/14/2013   Zoster Recombinat (Shingrix) 12/02/2020, 03/18/2021    Past Medical History:  Diagnosis Date   Arthritis    "knees, lower back; legs, ankles" (01/27/2016)   Asthma    followed by Dr. 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   Diabetes mellitus without complication ([place])    Dyspnea    GERD (gastroesophageal reflux disease)    Hypertension    "doctor took me off RX in 2016" (01/27/2016)   Hypertension    Influenza B April 4944   Complicated by multi-organ failure   OSA on CPAP "since " 03/20/2013   Pneumonia 2016   Required emergent intubation    asthma exacerbation in 2016   Skin ulcer ([place]) secondary to bullous impetigo /trauma 08/29/2018   Sleep apnea    Tobacco abuse    Upper airway cough syndrome     Tobacco History: Social History   Tobacco Use  Smoking Status Former   Packs/day: 0.25   Years: 21.00   Pack years: 5.25   Types:  Cigarettes   Start date: 1993   Quit date: 01/31/2013   Years since quitting: 8.6  Smokeless Tobacco Never   Counseling given: Not Answered   Outpatient Medications Prior to Visit  Medication Sig Dispense Refill   Accu-Chek Softclix Lancets lancets Use to check blood sugar once daily. 100 each 2   albuterol (PROAIR HFA) 108 (90 Base) MCG/ACT inhaler TAKE 2 PUFFS BY MOUTH EVERY 6 HOURS AS NEEDED FOR WHEEZE OR SHORTNESS OF BREATH 8.5 each 3   albuterol (PROVENTIL) (2.5 MG/3ML) 0.083% nebulizer solution Take 3 mLs (2.5 mg total) by nebulization every 6 (six) hours as needed for shortness of breath or wheezing. 75 mL 12   aspirin 81 MG EC tablet Take 1 tablet (81 mg total) by mouth daily. 30 tablet 3   Blood Glucose Monitoring Suppl (ACCU-CHEK GUIDE ME) w/Device KIT Use to check blood sugar once daily. 1 kit 0   budesonide (PULMICORT) 0.25 MG/2ML nebulizer solution TAKE 1 VIAL (0.25 MG TOTAL) BY NEBULIZATION 2 (TWO) TIMES DAILY. 60 mL 12   cloNIDine (CATAPRES) 0.2 MG tablet Take 0.2 mg by mouth 2 (two) times daily. (Patient not taking: Reported on 07/30/2021)     EPINEPHrine 0.3 mg/0.3 mL IJ SOAJ injection Inject 0.3 mg into the muscle as needed for anaphylaxis. 1 each 2   fluticasone (FLONASE) 50 MCG/ACT nasal spray Place 1 spray into  both nostrils daily as needed for allergies. 16 g 11   fluticasone-salmeterol (ADVAIR HFA) 230-21 MCG/ACT inhaler Inhale 2 puffs into the lungs 2 (two) times daily. 12 g 6   glucose blood (ACCU-CHEK GUIDE) test strip Use as instructed daily before breakfast. 100 each 2   metFORMIN (GLUCOPHAGE) 500 MG tablet TAKE 1 TABLET BY MOUTH 2 TIMES DAILY WITH A MEAL. 60 tablet 0   methocarbamol (ROBAXIN) 500 MG tablet TAKE 1 TABLET (500 MG TOTAL) BY MOUTH EVERY 8 (EIGHT) HOURS AS NEEDED FOR MUSCLE SPASMS. 60 tablet 1   Misc. Devices MISC Blood pressure monitor  Dx: Hypertension 1 each 0   Misc. Devices MISC Depends . Wt- 250 lbs, size 1X; Poise pads 3 each 11   Multiple  Vitamin (MULTIVITAMIN WITH MINERALS) TABS tablet Take 1 tablet by mouth daily.     oxyCODONE-acetaminophen (PERCOCET/ROXICET) 5-325 MG tablet Take 1 tablet by mouth every 6 (six) hours as needed for up to 8 doses for severe pain. 8 tablet 0   predniSONE (DELTASONE) 10 MG tablet Take 3 tablets (30 mg total) by mouth daily with breakfast for 2 days, THEN 2 tablets (20 mg total) daily with breakfast for 2 days, THEN 1 tablet (10 mg total) daily with breakfast for 2 days. 12 tablet 0   No facility-administered medications prior to visit.      Review of Systems  Review of Systems   Physical Exam  There were no vitals taken for this visit. Physical Exam   Lab Results:  CBC    Component Value Date/Time   WBC 9.1 06/17/2021 1401   RBC 5.30 (H) 06/17/2021 1401   HGB 15.3 (H) 06/17/2021 1401   HGB 14.8 12/02/2020 1143   HCT 47.7 (H) 06/17/2021 1401   HCT 46.9 (H) 12/02/2020 1143   PLT 331 06/17/2021 1401   PLT 318 12/02/2020 1143   MCV 90.0 06/17/2021 1401   MCV 87 12/02/2020 1143   MCH 28.9 06/17/2021 1401   MCHC 32.1 06/17/2021 1401   RDW 14.8 06/17/2021 1401   RDW 13.6 12/02/2020 1143   LYMPHSABS 2.6 06/17/2021 1401   LYMPHSABS 2.2 12/02/2020 1143   MONOABS 0.8 06/17/2021 1401   EOSABS 0.2 06/17/2021 1401   EOSABS 0.4 12/02/2020 1143   BASOSABS 0.1 06/17/2021 1401   BASOSABS 0.1 12/02/2020 1143    BMET    Component Value Date/Time   NA 138 06/17/2021 1401   NA 141 12/02/2020 1143   K 4.0 06/17/2021 1401   CL 104 06/17/2021 1401   CO2 25 06/17/2021 1401   GLUCOSE 98 06/17/2021 1401   BUN 8 06/17/2021 1401   BUN 14 12/02/2020 1143   CREATININE 0.97 06/17/2021 1401   CALCIUM 8.5 (L) 06/17/2021 1401   GFRNONAA >60 06/17/2021 1401   GFRAA 84 12/02/2020 1143    BNP    Component Value Date/Time   BNP 27.0 07/02/2020 2022    ProBNP    Component Value Date/Time   PROBNP 97.2 07/01/2014 1740    Imaging: No results found.   Assessment & Plan:   No  problem-specific Assessment & Plan notes found for this encounter.     Martyn Ehrich, NP 09/24/2021

## 2021-09-25 ENCOUNTER — Emergency Department (HOSPITAL_COMMUNITY): Payer: Medicaid Other

## 2021-09-25 ENCOUNTER — Inpatient Hospital Stay (HOSPITAL_COMMUNITY)
Admission: EM | Admit: 2021-09-25 | Discharge: 2021-10-01 | DRG: 202 | Disposition: A | Discharge status: Home / Self Care | Payer: Medicaid Other | Attending: Internal Medicine | Admitting: Internal Medicine

## 2021-09-25 ENCOUNTER — Other Ambulatory Visit: Payer: Self-pay

## 2021-09-25 DIAGNOSIS — R062 Wheezing: Secondary | ICD-10-CM | POA: Diagnosis not present

## 2021-09-25 DIAGNOSIS — Z91018 Allergy to other foods: Secondary | ICD-10-CM

## 2021-09-25 DIAGNOSIS — Z87891 Personal history of nicotine dependence: Secondary | ICD-10-CM

## 2021-09-25 DIAGNOSIS — Z8249 Family history of ischemic heart disease and other diseases of the circulatory system: Secondary | ICD-10-CM

## 2021-09-25 DIAGNOSIS — N179 Acute kidney failure, unspecified: Secondary | ICD-10-CM | POA: Diagnosis present

## 2021-09-25 DIAGNOSIS — R069 Unspecified abnormalities of breathing: Secondary | ICD-10-CM | POA: Diagnosis not present

## 2021-09-25 DIAGNOSIS — J4551 Severe persistent asthma with (acute) exacerbation: Secondary | ICD-10-CM | POA: Diagnosis present

## 2021-09-25 DIAGNOSIS — J45901 Unspecified asthma with (acute) exacerbation: Secondary | ICD-10-CM | POA: Diagnosis present

## 2021-09-25 DIAGNOSIS — E119 Type 2 diabetes mellitus without complications: Secondary | ICD-10-CM

## 2021-09-25 DIAGNOSIS — Z8701 Personal history of pneumonia (recurrent): Secondary | ICD-10-CM

## 2021-09-25 DIAGNOSIS — R0902 Hypoxemia: Secondary | ICD-10-CM | POA: Diagnosis not present

## 2021-09-25 DIAGNOSIS — Z6838 Body mass index (BMI) 38.0-38.9, adult: Secondary | ICD-10-CM

## 2021-09-25 DIAGNOSIS — Z7951 Long term (current) use of inhaled steroids: Secondary | ICD-10-CM

## 2021-09-25 DIAGNOSIS — Z91040 Latex allergy status: Secondary | ICD-10-CM

## 2021-09-25 DIAGNOSIS — E1165 Type 2 diabetes mellitus with hyperglycemia: Secondary | ICD-10-CM | POA: Diagnosis not present

## 2021-09-25 DIAGNOSIS — J45909 Unspecified asthma, uncomplicated: Secondary | ICD-10-CM

## 2021-09-25 DIAGNOSIS — R0602 Shortness of breath: Secondary | ICD-10-CM

## 2021-09-25 DIAGNOSIS — J4541 Moderate persistent asthma with (acute) exacerbation: Secondary | ICD-10-CM | POA: Diagnosis not present

## 2021-09-25 DIAGNOSIS — I10 Essential (primary) hypertension: Secondary | ICD-10-CM | POA: Diagnosis present

## 2021-09-25 DIAGNOSIS — Z7982 Long term (current) use of aspirin: Secondary | ICD-10-CM

## 2021-09-25 DIAGNOSIS — J9601 Acute respiratory failure with hypoxia: Secondary | ICD-10-CM | POA: Diagnosis not present

## 2021-09-25 DIAGNOSIS — K5900 Constipation, unspecified: Secondary | ICD-10-CM | POA: Diagnosis not present

## 2021-09-25 DIAGNOSIS — G4733 Obstructive sleep apnea (adult) (pediatric): Secondary | ICD-10-CM | POA: Diagnosis present

## 2021-09-25 DIAGNOSIS — K219 Gastro-esophageal reflux disease without esophagitis: Secondary | ICD-10-CM | POA: Diagnosis present

## 2021-09-25 DIAGNOSIS — Z20822 Contact with and (suspected) exposure to covid-19: Secondary | ICD-10-CM | POA: Diagnosis present

## 2021-09-25 DIAGNOSIS — Z5986 Financial insecurity: Secondary | ICD-10-CM

## 2021-09-25 DIAGNOSIS — J383 Other diseases of vocal cords: Secondary | ICD-10-CM | POA: Diagnosis present

## 2021-09-25 DIAGNOSIS — J449 Chronic obstructive pulmonary disease, unspecified: Secondary | ICD-10-CM | POA: Diagnosis present

## 2021-09-25 DIAGNOSIS — J4542 Moderate persistent asthma with status asthmaticus: Principal | ICD-10-CM | POA: Diagnosis present

## 2021-09-25 DIAGNOSIS — E669 Obesity, unspecified: Secondary | ICD-10-CM | POA: Diagnosis present

## 2021-09-25 DIAGNOSIS — Z825 Family history of asthma and other chronic lower respiratory diseases: Secondary | ICD-10-CM

## 2021-09-25 DIAGNOSIS — Z96652 Presence of left artificial knee joint: Secondary | ICD-10-CM | POA: Diagnosis present

## 2021-09-25 DIAGNOSIS — T380X5A Adverse effect of glucocorticoids and synthetic analogues, initial encounter: Secondary | ICD-10-CM | POA: Diagnosis not present

## 2021-09-25 DIAGNOSIS — B348 Other viral infections of unspecified site: Secondary | ICD-10-CM | POA: Diagnosis present

## 2021-09-25 DIAGNOSIS — Z7984 Long term (current) use of oral hypoglycemic drugs: Secondary | ICD-10-CM

## 2021-09-25 LAB — CBC WITH DIFFERENTIAL/PLATELET
Abs Immature Granulocytes: 0.02 10*3/uL (ref 0.00–0.07)
Basophils Absolute: 0.1 10*3/uL (ref 0.0–0.1)
Basophils Relative: 1 %
Eosinophils Absolute: 0.3 10*3/uL (ref 0.0–0.5)
Eosinophils Relative: 3 %
HCT: 44.3 % (ref 36.0–46.0)
Hemoglobin: 14.3 g/dL (ref 12.0–15.0)
Immature Granulocytes: 0 %
Lymphocytes Relative: 13 %
Lymphs Abs: 1 10*3/uL (ref 0.7–4.0)
MCH: 28.1 pg (ref 26.0–34.0)
MCHC: 32.3 g/dL (ref 30.0–36.0)
MCV: 87.2 fL (ref 80.0–100.0)
Monocytes Absolute: 0.6 10*3/uL (ref 0.1–1.0)
Monocytes Relative: 8 %
Neutro Abs: 5.9 10*3/uL (ref 1.7–7.7)
Neutrophils Relative %: 75 %
Platelets: 269 10*3/uL (ref 150–400)
RBC: 5.08 MIL/uL (ref 3.87–5.11)
RDW: 16.1 % — ABNORMAL HIGH (ref 11.5–15.5)
WBC: 7.9 10*3/uL (ref 4.0–10.5)
nRBC: 0 % (ref 0.0–0.2)

## 2021-09-25 LAB — BASIC METABOLIC PANEL
Anion gap: 6 (ref 5–15)
BUN: 17 mg/dL (ref 6–20)
CO2: 28 mmol/L (ref 22–32)
Calcium: 8.2 mg/dL — ABNORMAL LOW (ref 8.9–10.3)
Chloride: 106 mmol/L (ref 98–111)
Creatinine, Ser: 1.2 mg/dL — ABNORMAL HIGH (ref 0.44–1.00)
GFR, Estimated: 53 mL/min — ABNORMAL LOW (ref >60)
Glucose, Bld: 134 mg/dL — ABNORMAL HIGH (ref 70–99)
Potassium: 3.9 mmol/L (ref 3.5–5.1)
Sodium: 140 mmol/L (ref 135–145)

## 2021-09-25 LAB — RESP PANEL BY RT-PCR (FLU A&B, COVID) ARPGX2
Influenza A by PCR: NEGATIVE
Influenza B by PCR: NEGATIVE
SARS Coronavirus 2 by RT PCR: NEGATIVE

## 2021-09-25 LAB — BRAIN NATRIURETIC PEPTIDE: B Natriuretic Peptide: 10.4 pg/mL (ref 0.0–100.0)

## 2021-09-25 MED ORDER — MAGNESIUM SULFATE 2 GM/50ML IV SOLN
2.0000 g | Freq: Once | INTRAVENOUS | Status: AC
  Administered 2021-09-25: 2 g via INTRAVENOUS
  Filled 2021-09-25: qty 50

## 2021-09-25 MED ORDER — MOMETASONE FURO-FORMOTEROL FUM 200-5 MCG/ACT IN AERO
2.0000 | INHALATION_SPRAY | Freq: Two times a day (BID) | RESPIRATORY_TRACT | Status: DC
  Administered 2021-09-26 – 2021-09-27 (×3): 2 via RESPIRATORY_TRACT
  Filled 2021-09-25: qty 8.8

## 2021-09-25 MED ORDER — ALBUTEROL SULFATE (2.5 MG/3ML) 0.083% IN NEBU
12.0000 mL | INHALATION_SOLUTION | Freq: Once | RESPIRATORY_TRACT | Status: AC
  Administered 2021-09-25: 12 mL via RESPIRATORY_TRACT
  Filled 2021-09-25: qty 3

## 2021-09-25 MED ORDER — IPRATROPIUM-ALBUTEROL 0.5-2.5 (3) MG/3ML IN SOLN
3.0000 mL | Freq: Four times a day (QID) | RESPIRATORY_TRACT | Status: DC
Start: 2021-09-26 — End: 2021-09-27
  Administered 2021-09-26 – 2021-09-27 (×7): 3 mL via RESPIRATORY_TRACT
  Filled 2021-09-25 (×7): qty 3

## 2021-09-25 MED ORDER — ALBUTEROL SULFATE (2.5 MG/3ML) 0.083% IN NEBU
2.5000 mg | INHALATION_SOLUTION | RESPIRATORY_TRACT | Status: DC | PRN
  Administered 2021-09-26 (×4): 2.5 mg via RESPIRATORY_TRACT
  Filled 2021-09-25 (×5): qty 3

## 2021-09-25 MED ORDER — ASPIRIN EC 81 MG PO TBEC
81.0000 mg | DELAYED_RELEASE_TABLET | Freq: Every day | ORAL | Status: DC
  Administered 2021-09-26 – 2021-10-01 (×5): 81 mg via ORAL
  Filled 2021-09-25 (×5): qty 1

## 2021-09-25 MED ORDER — ENOXAPARIN SODIUM 30 MG/0.3ML IJ SOSY
30.0000 mg | PREFILLED_SYRINGE | Freq: Every day | INTRAMUSCULAR | Status: DC
  Administered 2021-09-26 – 2021-09-27 (×2): 30 mg via SUBCUTANEOUS
  Filled 2021-09-25 (×2): qty 0.3

## 2021-09-25 MED ORDER — FLUCONAZOLE 150 MG PO TABS
150.0000 mg | ORAL_TABLET | Freq: Once | ORAL | Status: AC
  Administered 2021-09-25: 150 mg via ORAL
  Filled 2021-09-25: qty 1

## 2021-09-25 MED ORDER — METHYLPREDNISOLONE SODIUM SUCC 125 MG IJ SOLR
125.0000 mg | Freq: Once | INTRAMUSCULAR | Status: AC
  Administered 2021-09-25: 125 mg via INTRAVENOUS
  Filled 2021-09-25: qty 2

## 2021-09-25 MED ORDER — ACETAMINOPHEN 325 MG PO TABS
650.0000 mg | ORAL_TABLET | Freq: Four times a day (QID) | ORAL | Status: DC | PRN
  Administered 2021-09-26 – 2021-09-29 (×2): 650 mg via ORAL
  Filled 2021-09-25 (×2): qty 2

## 2021-09-25 MED ORDER — PREDNISONE 20 MG PO TABS
50.0000 mg | ORAL_TABLET | Freq: Every day | ORAL | Status: DC
  Administered 2021-09-26: 08:00:00 50 mg via ORAL
  Filled 2021-09-25: qty 3

## 2021-09-25 MED ORDER — INSULIN ASPART 100 UNIT/ML IJ SOLN
0.0000 [IU] | Freq: Three times a day (TID) | INTRAMUSCULAR | Status: DC
  Administered 2021-09-26 – 2021-09-28 (×2): 2 [IU] via SUBCUTANEOUS
  Administered 2021-09-28: 3 [IU] via SUBCUTANEOUS

## 2021-09-25 MED ORDER — ACETAMINOPHEN 650 MG RE SUPP: 650.0000 mg | Freq: Four times a day (QID) | RECTAL | Status: DC | PRN

## 2021-09-25 NOTE — ED Triage Notes (Signed)
Having diff breathing for more than 2 days and has gotten worse,inhaler and neb treatments not giving relief at home. Chest soreness when taking frequent, rapid deep breaths

## 2021-09-25 NOTE — ED Notes (Signed)
Patient transported to X-ray 

## 2021-09-25 NOTE — ED Notes (Signed)
Patient is resting comfortably.breathing tx and mag completed. Pt updated on POC-ambulate with pulse ox check

## 2021-09-25 NOTE — ED Provider Notes (Signed)
[facility] EMERGENCY DEPARTMENT Provider Note   CSN: [id number] Arrival date & time: 09/25/21  2034     History Chief Complaint  Patient presents with   Shortness of Breath    Having diff breathing for more than 2 days and has gotten worse,inhaler and neb treatments not giving relief at home. Chest soreness when taking frequent, rapid deep breaths    Kathleen Rodriguez is a 55 y.o. female.  The history is provided by the patient and medical records. No language interpreter was used.  Shortness of Breath  55 year old female significant history of asthma, CHF, polysubstance abuse, diabetes, hypertension, OSA on CPAP brought here via EMS from home for evaluation of shortness of breath.  Patient reports 2 weeks ago she had her teeth pulled.  Since then she have not been using her CPAP machine due to her mouth discomfort.  For the past 2 days she noticed increasing wheezing and shortness of breath and cough that felt similar to her prior asthma exacerbation.  The symptom is moderate to severe not improved despite using rescue inhaler around-the-clock.  She denies fever, fluid retention, exertional chest pain, nausea vomiting or diarrhea.  No recent sick contact.  No runny nose sneezing sore throat.  Past Medical History:  Diagnosis Date   Arthritis    "knees, lower back; legs, ankles" (01/27/2016)   Asthma    followed by Dr. 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   Diabetes mellitus without complication ([place])    Dyspnea    GERD (gastroesophageal reflux disease)    Hypertension    "doctor took me off RX in 2016" (01/27/2016)   Hypertension    Influenza B April 0354   Complicated by multi-organ failure   OSA on CPAP "since " 03/20/2013   Pneumonia 2016   Required emergent intubation    asthma exacerbation in 2016   Skin ulcer ([place]) secondary to bullous impetigo  Marilynn Rail 08/29/2018   Sleep apnea    Tobacco abuse    Upper airway cough syndrome     Patient Active Problem List   Diagnosis Date Noted   Diabetes mellitus without complication ([place]) 65/68/1275   OSA (obstructive sleep apnea) 01/20/2021   Essential hypertension 01/20/2021   Obesity (BMI 30-39.9) 01/20/2021   Acute asthma exacerbation 10/09/2019   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   Asthma 10/22/2017   Osteoarthritis of left knee 09/01/2016   Total knee replacement status 09/01/2016   Osteoarthritis of knees, 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   Primary osteoarthritis of left knee 03/09/2016   Anemia, iron deficiency 08/12/2015   Dysfunctional uterine bleeding 12/28/2014   Morbid obesity ([place]) 05/16/2014   Upper airway cough syndrome 05/01/2014   GERD (gastroesophageal reflux disease) 04/08/2014   OSA (obstructive sleep apnea) 03/20/2013    Past Surgical History:  Procedure Laterality Date   BREAST SURGERY Right 1980   as a teenager , cyst was benign   CESAREAN SECTION  2006   LACERATION REPAIR Right ~ 1997   "tried to cut myself"   TOTAL KNEE ARTHROPLASTY Left 09/01/2016   Procedure: LEFT TOTAL KNEE ARTHROPLASTY;  Surgeon: Leandrew Koyanagi, MD;  Location: [place];  Service: Orthopedics;  Laterality: Left;   TUBAL LIGATION  2006     OB History   No obstetric history on file.     Family History  Problem Relation Age of Onset   Asthma Mother    Heart murmur Mother    Cancer Maternal Grandmother    Heart disease Maternal Grandmother    Hypertension Maternal Grandmother    Cancer Paternal Grandmother    Hypertension Mother    HIV/AIDS Father     Social History   Tobacco Use   Smoking status: Former    Packs/day: 0.25    Years: 21.00    Pack years: 5.25    Types: Cigarettes    Start date: 1993    Quit date: 01/31/2013    Years since quitting: 8.6   Smokeless tobacco: Never  Vaping Use   Vaping Use: Never used  Substance Use Topics   Drug use: Not Currently    Types: Cocaine,  "Crack" cocaine    Comment: 01/27/2016 "clean from smoking crack since 2007"    Home Medications Prior to Admission medications   Medication Sig Start Date End Date Taking? Authorizing Provider  Accu-Chek Softclix Lancets lancets Use to check blood sugar once daily. 05/04/21   [name], MD  albuterol (PROAIR HFA) 108 (90 Base) MCG/ACT inhaler TAKE 2 PUFFS BY MOUTH EVERY 6 HOURS AS NEEDED FOR WHEEZE OR SHORTNESS OF BREATH 04/02/21   Shawna Clamp, MD  albuterol (PROVENTIL) (2.5 MG/3ML) 0.083% nebulizer solution Take 3 mLs (2.5 mg total) by nebulization every 6 (six) hours as needed for shortness of breath or wheezing. 04/02/21   Shawna Clamp, MD  aspirin 81 MG EC tablet Take 1 tablet (81 mg total) by mouth daily. 02/07/19   [name], MD  Blood Glucose Monitoring Suppl (ACCU-CHEK GUIDE ME) w/Device KIT Use to check blood sugar once daily. 05/04/21   Newlin, Charlane Ferretti, MD  budesonide (PULMICORT) 0.25 MG/2ML nebulizer solution TAKE 1 VIAL (0.25 MG TOTAL) BY NEBULIZATION 2 (TWO) TIMES DAILY. 04/02/21 05/02/21  Shawna Clamp, MD  cloNIDine (CATAPRES) 0.2 MG tablet Take 0.2 mg by mouth 2 (two) times daily. Patient not taking: Reported on 07/30/2021    [provider]  EPINEPHrine 0.3 mg/0.3 mL IJ SOAJ injection Inject 0.3 mg into the muscle as needed for anaphylaxis. 05/06/21   Chesley Mires, MD  fluticasone (FLONASE) 50 MCG/ACT nasal spray Place 1 spray into both nostrils daily as needed for allergies. 01/04/21   Chesley Mires, MD  fluticasone-salmeterol (ADVAIR HFA) 230-21 MCG/ACT inhaler Inhale 2 puffs into the lungs 2 (two) times daily. 04/27/21   Chesley Mires, MD  glucose blood (ACCU-CHEK GUIDE) test strip Use as instructed daily before breakfast. 05/04/21   [name], MD  metFORMIN (GLUCOPHAGE) 500 MG tablet TAKE 1 TABLET BY MOUTH 2 TIMES DAILY WITH A MEAL. 05/26/21   [name], MD  methocarbamol (ROBAXIN) 500 MG tablet TAKE 1 TABLET (500 MG TOTAL) BY MOUTH EVERY 8 (EIGHT) HOURS  AS NEEDED FOR MUSCLE SPASMS. 05/28/21   Elsie Stain, MD  Misc. Devices MISC Blood pressure monitor  Dx: Hypertension 02/07/19   [name], MD  Misc. Devices MISC Depends . Wt- 250 lbs, size 1X; Poise pads 03/23/21   [name], MD  Multiple Vitamin (MULTIVITAMIN WITH MINERALS) TABS tablet Take 1 tablet by mouth daily.    [provider]  oxyCODONE-acetaminophen (PERCOCET/ROXICET) 5-325 MG tablet Take 1 tablet by mouth every 6 (six) hours as needed for up to 8 doses for severe pain. 06/17/21   Luna Fuse, MD  predniSONE (DELTASONE) 10 MG tablet Take 3 tablets (30 mg total) by mouth daily with breakfast for 2 days, THEN 2  tablets (20 mg total) daily with breakfast for 2 days, THEN 1 tablet (10 mg total) daily with breakfast for 2 days. 09/20/21 09/26/21  Chesley Mires, MD    Allergies    Tomato, Latex, Latex, Tomato, and Wool alcohol [lanolin]  Review of Systems   Review of Systems  Respiratory:  Positive for shortness of breath.   All other systems reviewed and are negative.  Physical Exam Updated Vital Signs BP 139/84   Pulse (!) 103   Temp 98.6 F (37 C) (Oral)   Resp 20   SpO2 99%   Physical Exam Vitals and nursing note reviewed.  Constitutional:      Appearance: She is well-developed. She is obese.     Comments: Patient in moderate respiratory discomfort  HENT:     Head: Atraumatic.     Mouth/Throat:     Comments: Patient is mostly edentulous no signs of dental infection noted Eyes:     Conjunctiva/sclera: Conjunctivae normal.  Cardiovascular:     Rate and Rhythm: Tachycardia present.     Pulses: Normal pulses.     Heart sounds: Normal heart sounds.  Pulmonary:     Effort: Pulmonary effort is normal.     Breath sounds: Wheezing present. No rhonchi or rales.  Abdominal:     Palpations: Abdomen is soft.     Tenderness: There is no abdominal tenderness.  Musculoskeletal:     Cervical back: Neck supple.     Right lower leg: No edema.     Left  lower leg: No edema.  Skin:    Findings: No rash.  Neurological:     Mental Status: She is alert and oriented to person, place, and time.  Psychiatric:        Mood and Affect: Mood normal.    ED Results / Procedures / Treatments   Labs (all labs ordered are listed, but only abnormal results are displayed) Labs Reviewed  BASIC METABOLIC PANEL - Abnormal; Notable for the following components:      Result Value   Glucose, Bld 134 (*)    Creatinine, Ser 1.20 (*)    Calcium 8.2 (*)    GFR, Estimated 53 (*)    All other components within normal limits  CBC WITH DIFFERENTIAL/PLATELET - Abnormal; Notable for the following components:   RDW 16.1 (*)    All other components within normal limits  RESP PANEL BY RT-PCR (FLU A&B, COVID) ARPGX2  RESPIRATORY PANEL BY PCR  BRAIN NATRIURETIC PEPTIDE  BASIC METABOLIC PANEL  CBC    EKG None  Radiology DG Chest 2 View  Result Date: 09/25/2021 CLINICAL DATA:  Shortness of breath and wheezing. EXAM: CHEST - 2 VIEW COMPARISON:  March 31, 2021 FINDINGS: The heart size and mediastinal contours are within normal limits. No focal consolidation. No pleural effusion. No pneumothorax. Chronic irregularity of the distal right clavicle likely related to prior trauma. IMPRESSION: No active cardiopulmonary disease. Electronically Signed   By: Dahlia Bailiff M.D.   On: 09/25/2021 21:31    Procedures Procedures   Medications Ordered in ED Medications  albuterol (PROVENTIL) (2.5 MG/3ML) 0.083% nebulizer solution 12 mL (12 mLs Nebulization Given 09/25/21 2120)  magnesium sulfate IVPB 2 g 50 mL (0 g Intravenous Stopped 09/25/21 2301)  methylPREDNISolone sodium succinate (SOLU-MEDROL) 125 mg/2 mL injection 125 mg (125 mg Intravenous Given 09/25/21 2129)  fluconazole (DIFLUCAN) tablet 150 mg (150 mg Oral Given 09/25/21 2120)    ED Course  I have reviewed the triage vital signs  and the nursing notes.  Pertinent labs & imaging results that were available during my  care of the patient were reviewed by me and considered in my medical decision making (see chart for details).    MDM Rules/Calculators/A&P                           BP 139/84   Pulse (!) 103   Temp 98.6 F (37 C) (Oral)   Resp 20   SpO2 99%   Final Clinical Impression(s) / ED Diagnoses Final diagnoses:  Moderate persistent asthma with exacerbation    Rx / DC Orders ED Discharge Orders     None      8:53 PM Patient presents with shortness of breath and wheezing similar to prior asthma exacerbation.  She is on a CPAP for OSA and having been using his CPAP for the past 2 weeks after she had her teeth pulled.  She denies any recent sick contact.  On exam patient is in moderate respiratory discomfort speaking in 1-2 words per sentence and exhibit accessory muscle use.  She has audible wheezing.  She does not appear to be fluid overloaded.  I suspect this is likely asthma exacerbation.  Work-up initiated, will give albuterol continuous nebs, Solu-Medrol, magnesium, and will monitor closely.    11:07 PM After receiving breathing treatment patient reports she feels a lot better however on reexamination, patient still having moderate wheezes and distant breath sounds.  Plan to have patient admitted for asthma exacerbation.  She has normal BNP, viral respiratory panel is negative, labs are mostly reassuring.  Mild AKI with a creatinine of 1.2, normal WBC, normal H&H, chest x-ray without signs of pneumonia.  11:47 PM Appreciate consultation from [facility] Dr. Trilby Drummer who agrees to see and will admit pt for asthma exacerbation as pt has high risk complicated asthma course requiring intubation in the past.  Pt agrees with plan.     Domenic Moras, PA-C 09/25/21 Arnoldo Lenis    Charlesetta Shanks, MD 10/01/21 1028

## 2021-09-25 NOTE — H&P (Signed)
History and Physical   Kathleen Rodriguez IFO:[id number] DOB: 1965/11/10 DOA: 09/25/2021  PCP: [name], MD    Patient coming from: Home   Chief Complaint: SOB  HPI: Kathleen Rodriguez is a 55 y.o. female with medical history significant of asthma, mixed COPD, anemia, diabetes, hypertension, GERD, obesity, dysfunctional uterine bleeding, CHF who presents with ongoing shortness of breath.  Patient states that she has had 2 to 3 days of worsening wheezing, shortness of breath, cough.  She noted this to be similar to previous asthma exacerbation.  She has been taking her home albuterol around-the-clock without improvement.  She notes that she had teeth pulled a couple weeks ago and has not been using her CPAP since secondary to discomfort.  She denies any recent sick contacts. Reports some chest tightness as well.  She denies fevers, chills, abdominal pain, constipation, diarrhea, nausea, vomiting.   ED Course: Vital signs in the ED significant for tachycardia in the 100s.  Intermittent tachypnea in the upper 20s.  Lab work-up showed BMP with creatinine 1.2 near baseline of 1.  Glucose 134, calcium 8.3.  CBC with normal limits.  BMP normal.  Respiratory panel for flu and COVID normal.  Chest x-ray without acute abnormality.  Patient received albuterol, magnesium, Solu-Medrol, Diflucan in the ED without significant improvement of her wheezing though she did feel little bit better.  Review of Systems: As per HPI otherwise all other systems reviewed and are negative.  Past Medical History:  Diagnosis Date   Arthritis    "knees, lower back; legs, ankles" (01/27/2016)   Asthma    followed by Dr. 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   Diabetes mellitus without complication ([place])    Dyspnea    GERD (gastroesophageal reflux disease)    Hypertension    "doctor took me off RX in 2016"  (01/27/2016)   Hypertension    Influenza B April 6767   Complicated by multi-organ failure   OSA on CPAP "since " 03/20/2013   Pneumonia 2016   Required emergent intubation    asthma exacerbation in 2016   Skin ulcer ([facility]) secondary to bullous impetigo /trauma 08/29/2018   Sleep apnea    Tobacco abuse    Upper airway cough syndrome     Past Surgical History:  Procedure Laterality Date   BREAST SURGERY Right 1980   as a teenager , cyst was benign   CESAREAN SECTION  2006   LACERATION REPAIR Right ~ 1997   "tried to cut myself"   TOTAL KNEE ARTHROPLASTY Left 09/01/2016   Procedure: LEFT TOTAL KNEE ARTHROPLASTY;  Surgeon: Leandrew Koyanagi, MD;  Location: [place];  Service: Orthopedics;  Laterality: Left;   TUBAL LIGATION  2006    Social History  reports that she quit smoking about 8 years ago. Her smoking use included cigarettes. She started smoking about 29 years ago. She has a 5.25 pack-year smoking history. She has never used smokeless tobacco. She reports that she does not currently use drugs after having used the following drugs: Cocaine and "Crack" cocaine. No history on file for alcohol use.  Allergies  Allergen Reactions   Tomato Hives, Itching and Other (See Comments)    ALSO REACTS TO KETCHUP   Latex Itching and Rash   Latex Hives and Rash   Tomato Hives and Rash  Wool Alcohol [Lanolin] Itching    Family History  Problem Relation Age of Onset   Asthma Mother    Heart murmur Mother    Cancer Maternal Grandmother    Heart disease Maternal Grandmother    Hypertension Maternal Grandmother    Cancer Paternal Grandmother    Hypertension Mother    HIV/AIDS Father   Reviewed on admission  Prior to Admission medications   Medication Sig Start Date End Date Taking? Authorizing Provider  Accu-Chek Softclix Lancets lancets Use to check blood sugar once daily. 05/04/21   [name], MD  albuterol (PROAIR HFA) 108 (90 Base) MCG/ACT inhaler TAKE 2 PUFFS BY MOUTH EVERY 6 HOURS  AS NEEDED FOR WHEEZE OR SHORTNESS OF BREATH 04/02/21   Shawna Clamp, MD  albuterol (PROVENTIL) (2.5 MG/3ML) 0.083% nebulizer solution Take 3 mLs (2.5 mg total) by nebulization every 6 (six) hours as needed for shortness of breath or wheezing. 04/02/21   Shawna Clamp, MD  aspirin 81 MG EC tablet Take 1 tablet (81 mg total) by mouth daily. 02/07/19   [name], MD  Blood Glucose Monitoring Suppl (ACCU-CHEK GUIDE ME) w/Device KIT Use to check blood sugar once daily. 05/04/21   Newlin, Charlane Ferretti, MD  budesonide (PULMICORT) 0.25 MG/2ML nebulizer solution TAKE 1 VIAL (0.25 MG TOTAL) BY NEBULIZATION 2 (TWO) TIMES DAILY. 04/02/21 05/02/21  Shawna Clamp, MD  cloNIDine (CATAPRES) 0.2 MG tablet Take 0.2 mg by mouth 2 (two) times daily. Patient not taking: Reported on 07/30/2021    [provider]  EPINEPHrine 0.3 mg/0.3 mL IJ SOAJ injection Inject 0.3 mg into the muscle as needed for anaphylaxis. 05/06/21   Chesley Mires, MD  fluticasone (FLONASE) 50 MCG/ACT nasal spray Place 1 spray into both nostrils daily as needed for allergies. 01/04/21   Chesley Mires, MD  fluticasone-salmeterol (ADVAIR HFA) 230-21 MCG/ACT inhaler Inhale 2 puffs into the lungs 2 (two) times daily. 04/27/21   Chesley Mires, MD  glucose blood (ACCU-CHEK GUIDE) test strip Use as instructed daily before breakfast. 05/04/21   [name], MD  metFORMIN (GLUCOPHAGE) 500 MG tablet TAKE 1 TABLET BY MOUTH 2 TIMES DAILY WITH A MEAL. 05/26/21   [name], MD  methocarbamol (ROBAXIN) 500 MG tablet TAKE 1 TABLET (500 MG TOTAL) BY MOUTH EVERY 8 (EIGHT) HOURS AS NEEDED FOR MUSCLE SPASMS. 05/28/21   Elsie Stain, MD  Misc. Devices MISC Blood pressure monitor  Dx: Hypertension 02/07/19   [name], MD  Misc. Devices MISC Depends . Wt- 250 lbs, size 1X; Poise pads 03/23/21   [name], MD  Multiple Vitamin (MULTIVITAMIN WITH MINERALS) TABS tablet Take 1 tablet by mouth daily.    [provider]  oxyCODONE-acetaminophen  (PERCOCET/ROXICET) 5-325 MG tablet Take 1 tablet by mouth every 6 (six) hours as needed for up to 8 doses for severe pain. 06/17/21   Luna Fuse, MD  predniSONE (DELTASONE) 10 MG tablet Take 3 tablets (30 mg total) by mouth daily with breakfast for 2 days, THEN 2 tablets (20 mg total) daily with breakfast for 2 days, THEN 1 tablet (10 mg total) daily with breakfast for 2 days. 09/20/21 09/26/21  Chesley Mires, MD    Physical Exam: Vitals:   09/25/21 2126 09/25/21 2130 09/25/21 2145 09/25/21 2230  BP: (!) 153/84 (!) 157/76 (!) 174/138 139/84  Pulse: (!) 103 99 (!) 101 (!) 103  Resp: 18 (!) '25 17 20  ' Temp:      TempSrc:      SpO2: 97% 100% 100%  99%   Physical Exam Constitutional:      General: She is not in acute distress.    Appearance: Normal appearance.  HENT:     Head: Normocephalic and atraumatic.     Mouth/Throat:     Mouth: Mucous membranes are moist.     Pharynx: Oropharynx is clear.  Eyes:     Extraocular Movements: Extraocular movements intact.     Pupils: Pupils are equal, round, and reactive to light.  Cardiovascular:     Rate and Rhythm: Regular rhythm. Tachycardia present.     Pulses: Normal pulses.     Heart sounds: Normal heart sounds.  Pulmonary:     Effort: Pulmonary effort is normal. Tachypnea present. No respiratory distress.     Breath sounds: Wheezing present.  Abdominal:     General: Bowel sounds are normal. There is no distension.     Palpations: Abdomen is soft.     Tenderness: There is no abdominal tenderness.  Musculoskeletal:        General: No swelling or deformity.  Skin:    General: Skin is warm and dry.  Neurological:     General: No focal deficit present.     Mental Status: Mental status is at baseline.   Labs on Admission: I have personally reviewed following labs and imaging studies  CBC: Recent Labs  Lab 09/25/21 2050  WBC 7.9  NEUTROABS 5.9  HGB 14.3  HCT 44.3  MCV 87.2  PLT 867    Basic Metabolic Panel: Recent Labs  Lab  09/25/21 2050  NA 140  K 3.9  CL 106  CO2 28  GLUCOSE 134*  BUN 17  CREATININE 1.20*  CALCIUM 8.2*    GFR: CrCl cannot be calculated (Unknown ideal weight.).  Liver Function Tests: No results for input(s): AST, ALT, ALKPHOS, BILITOT, PROT, ALBUMIN in the last 168 hours.  Urine analysis:    Component Value Date/Time   COLORURINE AMBER (A) 06/17/2021 1341   APPEARANCEUR CLEAR 06/17/2021 1341   LABSPEC 1.027 06/17/2021 1341   PHURINE 5.0 06/17/2021 1341   GLUCOSEU NEGATIVE 06/17/2021 1341   HGBUR NEGATIVE 06/17/2021 1341   BILIRUBINUR NEGATIVE 06/17/2021 1341   BILIRUBINUR small 05/26/2017 1539   KETONESUR 20 (A) 06/17/2021 1341   PROTEINUR NEGATIVE 06/17/2021 1341   UROBILINOGEN 1.0 05/26/2017 1539   UROBILINOGEN 1.0 03/22/2014 0805   NITRITE NEGATIVE 06/17/2021 1341   LEUKOCYTESUR NEGATIVE 06/17/2021 1341    Radiological Exams on Admission: DG Chest 2 View  Result Date: 09/25/2021 CLINICAL DATA:  Shortness of breath and wheezing. EXAM: CHEST - 2 VIEW COMPARISON:  March 31, 2021 FINDINGS: The heart size and mediastinal contours are within normal limits. No focal consolidation. No pleural effusion. No pneumothorax. Chronic irregularity of the distal right clavicle likely related to prior trauma. IMPRESSION: No active cardiopulmonary disease. Electronically Signed   By: Dahlia Bailiff M.D.   On: 09/25/2021 21:31    EKG: Independently reviewed.  Sinus tachycardia at 107 bpm.  J-point elevation in anterior leads.  Similar to previous.  Assessment/Plan Principal Problem:   Asthma exacerbation Active Problems:   Diabetes mellitus without complication (HCC)  Asthma exacerbation > History of asthma and COPD asthma overlap according to chart > Worsening shortness of breath for the past 2 to 3 days with wheezing and cough.  Similar to previous asthma exacerbation.  Not responding to home inhaler. > Negative for flu and COVID in the ED.  We will check full viral panel. >  Notably has been  off CPAP after having teeth pulled due to pain. > Still wheezing following Solu-Medrol, albuterol, magnesium in the ED. > Per chart review does have a history of being intubated for asthma exacerbation in 2016. - Monitor on telemetry - Continue with DuoNebs every 6 hours - As needed albuterol every 2 hours - Prednisone 50 mg daily - Check full viral panel -Replace home Advair with formulary Dulera  Diabetes - SSI  Hypertension GERD - History of this but not on any medications  DVT prophylaxis: Lovenox  Code Status:   Full  Family Communication:  None on admission  Disposition Plan:   Patient is from:  Home  Anticipated DC to:  Home  Anticipated DC date:  1 to 2 days  Anticipated DC barriers: None  Consults called:  None  Admission status:  Observation, telemetry   Severity of Illness: The appropriate patient status for this patient is OBSERVATION. Observation status is judged to be reasonable and necessary in order to provide the required intensity of service to ensure the patient's safety. The patient's presenting symptoms, physical exam findings, and initial radiographic and laboratory data in the context of their medical condition is felt to place them at decreased risk for further clinical deterioration. Furthermore, it is anticipated that the patient will be medically stable for discharge from the hospital within 2 midnights of admission.    Marcelyn Bruins MD [facility]  How to contact the [facility] Attending or Consulting provider [place] or covering provider during after hours [place], for this patient?   Check the care team in [facility] and look for a) attending/consulting [facility] provider listed and b) the [facility] team listed Log into [email or web address] and use [place] universal password to access. If you do not have the password, please contact the hospital operator. Locate the [facility] provider you are looking for under [facility] and page to a number  that you can be directly reached. If you still have difficulty reaching the provider, please page the [facility] (Director on Call) for the Hospitalists listed on amion for assistance.  09/25/2021, 11:49 PM

## 2021-09-26 ENCOUNTER — Encounter (HOSPITAL_COMMUNITY): Payer: Self-pay | Admitting: Internal Medicine

## 2021-09-26 ENCOUNTER — Other Ambulatory Visit: Payer: Self-pay

## 2021-09-26 DIAGNOSIS — E119 Type 2 diabetes mellitus without complications: Secondary | ICD-10-CM | POA: Diagnosis not present

## 2021-09-26 DIAGNOSIS — J4541 Moderate persistent asthma with (acute) exacerbation: Secondary | ICD-10-CM | POA: Diagnosis not present

## 2021-09-26 LAB — BASIC METABOLIC PANEL
Anion gap: 9 (ref 5–15)
BUN: 15 mg/dL (ref 6–20)
CO2: 26 mmol/L (ref 22–32)
Calcium: 8.2 mg/dL — ABNORMAL LOW (ref 8.9–10.3)
Chloride: 102 mmol/L (ref 98–111)
Creatinine, Ser: 1.17 mg/dL — ABNORMAL HIGH (ref 0.44–1.00)
GFR, Estimated: 55 mL/min — ABNORMAL LOW (ref >60)
Glucose, Bld: 258 mg/dL — ABNORMAL HIGH (ref 70–99)
Potassium: 3.9 mmol/L (ref 3.5–5.1)
Sodium: 137 mmol/L (ref 135–145)

## 2021-09-26 LAB — CBC
HCT: 44.4 % (ref 36.0–46.0)
Hemoglobin: 14.3 g/dL (ref 12.0–15.0)
MCH: 28.4 pg (ref 26.0–34.0)
MCHC: 32.2 g/dL (ref 30.0–36.0)
MCV: 88.3 fL (ref 80.0–100.0)
Platelets: 297 10*3/uL (ref 150–400)
RBC: 5.03 MIL/uL (ref 3.87–5.11)
RDW: 16.3 % — ABNORMAL HIGH (ref 11.5–15.5)
WBC: 5.8 10*3/uL (ref 4.0–10.5)
nRBC: 0 % (ref 0.0–0.2)

## 2021-09-26 LAB — CBG MONITORING, ED
Glucose-Capillary: 116 mg/dL — ABNORMAL HIGH (ref 70–99)
Glucose-Capillary: 111 mg/dL — ABNORMAL HIGH (ref 70–99)

## 2021-09-26 LAB — GLUCOSE, CAPILLARY
Glucose-Capillary: 161 mg/dL — ABNORMAL HIGH (ref 70–99)
Glucose-Capillary: 146 mg/dL — ABNORMAL HIGH (ref 70–99)
Glucose-Capillary: 143 mg/dL — ABNORMAL HIGH (ref 70–99)

## 2021-09-26 MED ORDER — METHOCARBAMOL 500 MG PO TABS
500.0000 mg | ORAL_TABLET | Freq: Three times a day (TID) | ORAL | Status: DC | PRN
  Administered 2021-09-26: 16:00:00 500 mg via ORAL
  Filled 2021-09-26: qty 1

## 2021-09-26 MED ORDER — GUAIFENESIN 100 MG/5ML PO LIQD
10.0000 mL | Freq: Four times a day (QID) | ORAL | Status: DC | PRN
  Administered 2021-09-27 – 2021-09-29 (×2): 10 mL via ORAL
  Filled 2021-09-26 (×2): qty 10

## 2021-09-26 MED ORDER — METHYLPREDNISOLONE SODIUM SUCC 125 MG IJ SOLR
80.0000 mg | INTRAMUSCULAR | Status: DC
  Administered 2021-09-26 – 2021-09-27 (×2): 80 mg via INTRAVENOUS
  Filled 2021-09-26 (×2): qty 2

## 2021-09-26 MED ORDER — LORATADINE 10 MG PO TABS
10.0000 mg | ORAL_TABLET | Freq: Every day | ORAL | Status: DC
  Administered 2021-09-26 – 2021-10-01 (×5): 10 mg via ORAL
  Filled 2021-09-26 (×5): qty 1

## 2021-09-26 NOTE — Progress Notes (Signed)
Notified Dr. Bruna Potter regarding patient respiratory difficulty earlier which was already resolved. This time central telemetry called stating that the patient has ST elevation on some leads . RN reviewed past EKG that was done 09/25/2021 but did not noticed any significant ST elevation. Patient has no complaint of chest pain except for the respiratory episode earlier. Patient is sleeping calmly at this time. Dr. Bruna Potter postponed the EKG for now and wait until the am when patient is awake.

## 2021-09-26 NOTE — Progress Notes (Signed)
   09/26/21 1434  Assess: MEWS Score  Temp 98.1 F (36.7 C)  BP (!) 123/104  Pulse Rate (!) 115  ECG Heart Rate (!) 115  Resp 20  Level of Consciousness Alert  SpO2 92 %  O2 Device Room Air  Assess: MEWS Score  MEWS Temp 0  MEWS Systolic 0  MEWS Pulse 2  MEWS RR 0  MEWS LOC 0  MEWS Score 2  MEWS Score Color Yellow  Assess: if the MEWS score is Yellow or Red  Were vital signs taken at a resting state? Yes  Focused Assessment No change from prior assessment  Early Detection of Sepsis Score *See Row Information* Low  MEWS guidelines implemented *See Row Information* Yes  Treat  MEWS Interventions Administered scheduled meds/treatments  Pain Scale 0-10  Pain Score 0  Take Vital Signs  Increase Vital Sign Frequency  Yellow: Q 2hr X 2 then Q 4hr X 2, if remains yellow, continue Q 4hrs  Escalate  MEWS: Escalate Yellow: discuss with charge nurse/RN and consider discussing with provider and RRT  Notify: Charge Nurse/RN  Name of Charge Nurse/RN Notified Josh  Date Charge Nurse/RN Notified 09/26/21  Time Charge Nurse/RN Notified 1505  Document  Patient Outcome Stabilized after interventions  Progress note created (see row info) Yes

## 2021-09-26 NOTE — ED Notes (Signed)
Patient sitting at sink giving self a bath. Denies further needs. Bed sheets changed. New gown given

## 2021-09-26 NOTE — Progress Notes (Signed)
PROGRESS NOTE    Kathleen Rodriguez  [id number]:[id number] DOB: 05/23/1966 DOA: 09/25/2021 PCP: Kathleen Register, MD    Brief Narrative:  55 year old with history of chronic moderate asthma, anemia, type 2 diabetes, hypertension, GERD and obesity presented with ongoing shortness of breath for about 3 days.  She tried albuterol treatment at home with no improvement.  No other preceding events.  She thinks she had some dental work done a week ago and that might have triggered it.  In the emergency room hemodynamically stable.  Significant bronchospasm not relieved with treatment in the ER so requested admission.   Assessment & Plan:   Principal Problem:   Asthma exacerbation Active Problems:   Diabetes mellitus without complication (HCC)  Acute asthma exacerbation in a patient with chronic moderate asthma: Agree with admission to monitored unit because of severity of symptoms. Aggressive bronchodilator therapy, IV steroids, inhalational steroids, scheduled and as needed bronchodilators, deep breathing exercises, incentive spirometry, chest physiotherapy and respiratory therapy consult.  Mucolytic's.  Supplemental oxygen to keep saturations more than 92%. COVID and influenza negative.  Respiratory virus panel pending.  Type 2 diabetes without complications: Well-controlled at home on metformin.  Currently using high-dose steroids.  Keep on sliding scale insulin.  Will resume metformin on discharge.   DVT prophylaxis: enoxaparin (LOVENOX) injection 30 mg Start: 09/26/21 1000   Code Status: Full code Family Communication: None Disposition Plan: Status is: Observation  The patient will require care spanning > 2 midnights and should be moved to inpatient because: Significant bronchospasm needing frequent bronchodilator therapy, evaluated by respiratory therapy and IV steroids.        Consultants:  None  Procedures:  None  Antimicrobials:  None   Subjective: Patient seen and  examined.  She was receiving nebulizer in the emergency room.  Still has significant chest tightness and dry cough.  Objective: Vitals:   09/26/21 1000 09/26/21 1100 09/26/21 1400 09/26/21 1434  BP: 127/79 (!) 144/84 (!) 151/77 (!) 123/104  Pulse: 98 94 (!) 106 (!) 115  Resp: (!) 21 19 (!) 23 20  Temp:    98.1 F (36.7 C)  TempSrc:    Oral  SpO2: 93% 93% 98% 92%  Weight:    103.4 kg  Height:    5\' 5"  (1.651 m)   No intake or output data in the 24 hours ending 09/26/21 1525 Filed Weights   09/26/21 1434  Weight: 103.4 kg    Examination:  General exam: Appears anxious and tremulous after breathing treatment.  Not using accessory muscles. Respiratory system: Bilateral poor air entry and both inspiratory and expiratory wheezes all over the lung fields. Cardiovascular system: S1 & S2 heard, RRR. No JVD, murmurs, rubs, gallops or clicks. No pedal edema. Gastrointestinal system: Abdomen is nondistended, soft and nontender. No organomegaly or masses felt. Normal bowel sounds heard. Central nervous system: Alert and oriented. No focal neurological deficits. Extremities: Symmetric 5 x 5 power. Skin: No rashes, lesions or ulcers Psychiatry: Judgement and insight appear normal.  Anxious.    Data Reviewed: I have personally reviewed following labs and imaging studies  CBC: Recent Labs  Lab 09/25/21 2050 09/26/21 0332  WBC 7.9 5.8  NEUTROABS 5.9  --   HGB 14.3 14.3  HCT 44.3 44.4  MCV 87.2 88.3  PLT 269 297   Basic Metabolic Panel: Recent Labs  Lab 09/25/21 2050 09/26/21 0332  NA 140 137  K 3.9 3.9  CL 106 102  CO2 28 26  GLUCOSE 134* 258*  BUN 17 15  CREATININE 1.20* 1.17*  CALCIUM 8.2* 8.2*   GFR: Estimated Creatinine Clearance: 64.1 mL/min (A) (by C-G formula based on SCr of 1.17 mg/dL (H)). Liver Function Tests: No results for input(s): AST, ALT, ALKPHOS, BILITOT, PROT, ALBUMIN in the last 168 hours. No results for input(s): LIPASE, AMYLASE in the last 168  hours. No results for input(s): AMMONIA in the last 168 hours. Coagulation Profile: No results for input(s): INR, PROTIME in the last 168 hours. Cardiac Enzymes: No results for input(s): CKTOTAL, CKMB, CKMBINDEX, TROPONINI in the last 168 hours. BNP (last 3 results) No results for input(s): PROBNP in the last 8760 hours. HbA1C: No results for input(s): HGBA1C in the last 72 hours. CBG: Recent Labs  Lab 09/26/21 0754 09/26/21 1145 09/26/21 1435  GLUCAP 116* 111* 161*   Lipid Profile: No results for input(s): CHOL, HDL, LDLCALC, TRIG, CHOLHDL, LDLDIRECT in the last 72 hours. Thyroid Function Tests: No results for input(s): TSH, T4TOTAL, FREET4, T3FREE, THYROIDAB in the last 72 hours. Anemia Panel: No results for input(s): VITAMINB12, FOLATE, FERRITIN, TIBC, IRON, RETICCTPCT in the last 72 hours. Sepsis Labs: No results for input(s): PROCALCITON, LATICACIDVEN in the last 168 hours.  Recent Results (from the past 240 hour(s))  Resp Panel by RT-PCR (Flu A&B, Covid) Nasopharyngeal Swab     Status: None   Collection Time: 09/25/21  8:51 PM   Specimen: Nasopharyngeal Swab; Nasopharyngeal(NP) swabs in vial transport medium  Result Value Ref Range Status   SARS Coronavirus 2 by RT PCR NEGATIVE NEGATIVE Final    Comment: (NOTE) SARS-CoV-2 target nucleic acids are NOT DETECTED.  The SARS-CoV-2 RNA is generally detectable in upper respiratory specimens during the acute phase of infection. The lowest concentration of SARS-CoV-2 viral copies this assay can detect is 138 copies/mL. A negative result does not preclude SARS-Cov-2 infection and should not be used as the sole basis for treatment or other patient management decisions. A negative result may occur with  improper specimen collection/handling, submission of specimen other than nasopharyngeal swab, presence of viral mutation(s) within the areas targeted by this assay, and inadequate number of viral copies(<138 copies/mL). A  negative result must be combined with clinical observations, patient history, and epidemiological information. The expected result is Negative.  Fact Sheet for Patients:  BloggerCourse.com  Fact Sheet for Healthcare Providers:  SeriousBroker.it  This test is no t yet approved or cleared by the [place] FDA and  has been authorized for detection and/or diagnosis of SARS-CoV-2 by FDA under an Emergency Use Authorization (EUA). This EUA will remain  in effect (meaning this test can be used) for the duration of the COVID-19 declaration under Section 564(b)(1) of the Act, 21 U.S.C.section 360bbb-3(b)(1), unless the authorization is terminated  or revoked sooner.       Influenza A by PCR NEGATIVE NEGATIVE Final   Influenza B by PCR NEGATIVE NEGATIVE Final    Comment: (NOTE) The Xpert Xpress SARS-CoV-2/FLU/RSV plus assay is intended as an aid in the diagnosis of influenza from Nasopharyngeal swab specimens and should not be used as a sole basis for treatment. Nasal washings and aspirates are unacceptable for Xpert Xpress SARS-CoV-2/FLU/RSV testing.  Fact Sheet for Patients: BloggerCourse.com  Fact Sheet for Healthcare Providers: SeriousBroker.it  This test is not yet approved or cleared by the [place] FDA and has been authorized for detection and/or diagnosis of SARS-CoV-2 by FDA under an Emergency Use Authorization (EUA). This EUA will remain in effect (meaning this test can be used)  for the duration of the COVID-19 declaration under Section 564(b)(1) of the Act, 21 U.S.C. section 360bbb-3(b)(1), unless the authorization is terminated or revoked.  Performed at [facility], [address] [place], [place] [address]          Radiology Studies: DG Chest 2 View  Result Date: 09/25/2021 CLINICAL DATA:  Shortness of breath and wheezing. EXAM: CHEST - 2 VIEW  COMPARISON:  March 31, 2021 FINDINGS: The heart size and mediastinal contours are within normal limits. No focal consolidation. No pleural effusion. No pneumothorax. Chronic irregularity of the distal right clavicle likely related to prior trauma. IMPRESSION: No active cardiopulmonary disease. Electronically Signed   By: Maudry Mayhew M.D.   On: 09/25/2021 21:31        Scheduled Meds:  aspirin EC  81 mg Oral Daily   enoxaparin (LOVENOX) injection  30 mg Subcutaneous Daily   insulin aspart  0-15 Units Subcutaneous TID WC   ipratropium-albuterol  3 mL Nebulization Q6H   loratadine  10 mg Oral Daily   methylPREDNISolone (SOLU-MEDROL) injection  80 mg Intravenous Q24H   mometasone-formoterol  2 puff Inhalation BID   Continuous Infusions:   LOS: 0 days    Time spent: 30 minutes    Dorcas Carrow, MD [facility] Pager [phone]

## 2021-09-26 NOTE — Progress Notes (Signed)
RN called RT due to patient with shortness of breath, shallow breathing, tachypnea, anxiety, and oxygen saturation 90% on 2L/[place]. RN gave patient inhaler and provide emotional support awaiting the RT. Patient able to calm down for a bit and oxygen saturation increased to 92% on 2L/[place]. Husband called and able to talk with the patient. Patient noted to be tachypneic with shortness of breath while talking on the phone. RT will be in soon.

## 2021-09-26 NOTE — Progress Notes (Signed)
RT in at the bedside to give breathing treatment and assessed patient. RT increased oxygen to 4L/Kathleen Rodriguez with oxygen saturation 91% to 92%. Emotional support given to patient and RN asked the family not to call for tonight so that the patient will be able to rest.

## 2021-09-27 ENCOUNTER — Observation Stay (HOSPITAL_COMMUNITY): Payer: Medicaid Other

## 2021-09-27 DIAGNOSIS — R0603 Acute respiratory distress: Secondary | ICD-10-CM | POA: Diagnosis not present

## 2021-09-27 DIAGNOSIS — Z87891 Personal history of nicotine dependence: Secondary | ICD-10-CM | POA: Diagnosis not present

## 2021-09-27 DIAGNOSIS — E669 Obesity, unspecified: Secondary | ICD-10-CM | POA: Diagnosis not present

## 2021-09-27 DIAGNOSIS — Z7982 Long term (current) use of aspirin: Secondary | ICD-10-CM | POA: Diagnosis not present

## 2021-09-27 DIAGNOSIS — J4541 Moderate persistent asthma with (acute) exacerbation: Secondary | ICD-10-CM | POA: Diagnosis not present

## 2021-09-27 DIAGNOSIS — J383 Other diseases of vocal cords: Secondary | ICD-10-CM | POA: Diagnosis not present

## 2021-09-27 DIAGNOSIS — I10 Essential (primary) hypertension: Secondary | ICD-10-CM | POA: Diagnosis not present

## 2021-09-27 DIAGNOSIS — B348 Other viral infections of unspecified site: Secondary | ICD-10-CM | POA: Diagnosis not present

## 2021-09-27 DIAGNOSIS — J449 Chronic obstructive pulmonary disease, unspecified: Secondary | ICD-10-CM | POA: Diagnosis not present

## 2021-09-27 DIAGNOSIS — J4551 Severe persistent asthma with (acute) exacerbation: Secondary | ICD-10-CM | POA: Diagnosis not present

## 2021-09-27 DIAGNOSIS — E1165 Type 2 diabetes mellitus with hyperglycemia: Secondary | ICD-10-CM | POA: Diagnosis not present

## 2021-09-27 DIAGNOSIS — G4733 Obstructive sleep apnea (adult) (pediatric): Secondary | ICD-10-CM | POA: Diagnosis not present

## 2021-09-27 DIAGNOSIS — Z91040 Latex allergy status: Secondary | ICD-10-CM | POA: Diagnosis not present

## 2021-09-27 DIAGNOSIS — N179 Acute kidney failure, unspecified: Secondary | ICD-10-CM | POA: Diagnosis not present

## 2021-09-27 DIAGNOSIS — Z7951 Long term (current) use of inhaled steroids: Secondary | ICD-10-CM | POA: Diagnosis not present

## 2021-09-27 DIAGNOSIS — T380X5A Adverse effect of glucocorticoids and synthetic analogues, initial encounter: Secondary | ICD-10-CM | POA: Diagnosis not present

## 2021-09-27 DIAGNOSIS — Z91018 Allergy to other foods: Secondary | ICD-10-CM | POA: Diagnosis not present

## 2021-09-27 DIAGNOSIS — Z8249 Family history of ischemic heart disease and other diseases of the circulatory system: Secondary | ICD-10-CM | POA: Diagnosis not present

## 2021-09-27 DIAGNOSIS — Z9989 Dependence on other enabling machines and devices: Secondary | ICD-10-CM | POA: Diagnosis not present

## 2021-09-27 DIAGNOSIS — Z8701 Personal history of pneumonia (recurrent): Secondary | ICD-10-CM | POA: Diagnosis not present

## 2021-09-27 DIAGNOSIS — R0600 Dyspnea, unspecified: Secondary | ICD-10-CM | POA: Diagnosis not present

## 2021-09-27 DIAGNOSIS — R0602 Shortness of breath: Secondary | ICD-10-CM | POA: Diagnosis not present

## 2021-09-27 DIAGNOSIS — Z96652 Presence of left artificial knee joint: Secondary | ICD-10-CM | POA: Diagnosis not present

## 2021-09-27 DIAGNOSIS — Z20822 Contact with and (suspected) exposure to covid-19: Secondary | ICD-10-CM | POA: Diagnosis not present

## 2021-09-27 DIAGNOSIS — Z6838 Body mass index (BMI) 38.0-38.9, adult: Secondary | ICD-10-CM | POA: Diagnosis not present

## 2021-09-27 DIAGNOSIS — K219 Gastro-esophageal reflux disease without esophagitis: Secondary | ICD-10-CM | POA: Diagnosis not present

## 2021-09-27 DIAGNOSIS — Z7984 Long term (current) use of oral hypoglycemic drugs: Secondary | ICD-10-CM | POA: Diagnosis not present

## 2021-09-27 DIAGNOSIS — E119 Type 2 diabetes mellitus without complications: Secondary | ICD-10-CM | POA: Diagnosis not present

## 2021-09-27 DIAGNOSIS — J9601 Acute respiratory failure with hypoxia: Secondary | ICD-10-CM | POA: Diagnosis not present

## 2021-09-27 DIAGNOSIS — J4542 Moderate persistent asthma with status asthmaticus: Secondary | ICD-10-CM | POA: Diagnosis not present

## 2021-09-27 DIAGNOSIS — K5900 Constipation, unspecified: Secondary | ICD-10-CM | POA: Diagnosis not present

## 2021-09-27 LAB — RESPIRATORY PANEL BY PCR

## 2021-09-27 LAB — POCT I-STAT 7, (LYTES, BLD GAS, ICA,H+H)
Acid-Base Excess: 1 mmol/L (ref 0.0–2.0)
Bicarbonate: 29.4 mmol/L — ABNORMAL HIGH (ref 20.0–28.0)
Calcium, Ion: 1.19 mmol/L (ref 1.15–1.40)
HCT: 46 % (ref 36.0–46.0)
Hemoglobin: 15.6 g/dL — ABNORMAL HIGH (ref 12.0–15.0)
O2 Saturation: 94 %
Patient temperature: 97.5
Potassium: 4.9 mmol/L (ref 3.5–5.1)
Sodium: 137 mmol/L (ref 135–145)
TCO2: 31 mmol/L (ref 22–32)
pCO2 arterial: 57.6 mmHg — ABNORMAL HIGH (ref 32.0–48.0)
pH, Arterial: 7.312 — ABNORMAL LOW (ref 7.350–7.450)
pO2, Arterial: 79 mmHg — ABNORMAL LOW (ref 83.0–108.0)

## 2021-09-27 LAB — GLUCOSE, CAPILLARY
Glucose-Capillary: 102 mg/dL — ABNORMAL HIGH (ref 70–99)
Glucose-Capillary: 103 mg/dL — ABNORMAL HIGH (ref 70–99)
Glucose-Capillary: 86 mg/dL (ref 70–99)
Glucose-Capillary: 110 mg/dL — ABNORMAL HIGH (ref 70–99)
Glucose-Capillary: 135 mg/dL — ABNORMAL HIGH (ref 70–99)
Glucose-Capillary: 148 mg/dL — ABNORMAL HIGH (ref 70–99)

## 2021-09-27 MED ORDER — IPRATROPIUM-ALBUTEROL 0.5-2.5 (3) MG/3ML IN SOLN: 3.0000 mL | RESPIRATORY_TRACT | Status: DC | PRN

## 2021-09-27 MED ORDER — REVEFENACIN 175 MCG/3ML IN SOLN
175.0000 ug | Freq: Every day | RESPIRATORY_TRACT | Status: DC
  Administered 2021-09-28 – 2021-10-01 (×5): 175 ug via RESPIRATORY_TRACT
  Filled 2021-09-27 (×5): qty 3

## 2021-09-27 MED ORDER — DEXMEDETOMIDINE HCL IN NACL 400 MCG/100ML IV SOLN
0.2000 ug/kg/h | INTRAVENOUS | Status: AC
  Administered 2021-09-27: 0.4 ug/kg/h via INTRAVENOUS
  Administered 2021-09-28: 0.6 ug/kg/h via INTRAVENOUS
  Administered 2021-09-28: 0.4 ug/kg/h via INTRAVENOUS
  Filled 2021-09-27 (×4): qty 100

## 2021-09-27 MED ORDER — DIPHENHYDRAMINE HCL 50 MG/ML IJ SOLN
25.0000 mg | Freq: Once | INTRAMUSCULAR | Status: AC
  Administered 2021-09-27: 25 mg via INTRAVENOUS
  Filled 2021-09-27: qty 1

## 2021-09-27 MED ORDER — HYDRALAZINE HCL 20 MG/ML IJ SOLN
10.0000 mg | INTRAMUSCULAR | Status: DC | PRN
  Administered 2021-09-29: 10 mg via INTRAVENOUS
  Filled 2021-09-27: qty 1

## 2021-09-27 MED ORDER — ALBUTEROL (5 MG/ML) CONTINUOUS INHALATION SOLN
10.0000 mg/h | INHALATION_SOLUTION | RESPIRATORY_TRACT | Status: DC
  Administered 2021-09-27 – 2021-09-28 (×2): 10 mg/h via RESPIRATORY_TRACT
  Filled 2021-09-27: qty 0.5

## 2021-09-27 MED ORDER — BUDESONIDE 0.5 MG/2ML IN SUSP
0.5000 mg | Freq: Two times a day (BID) | RESPIRATORY_TRACT | Status: DC
  Administered 2021-09-27 – 2021-10-01 (×8): 0.5 mg via RESPIRATORY_TRACT
  Filled 2021-09-27 (×8): qty 2

## 2021-09-27 MED ORDER — ALBUTEROL SULFATE (2.5 MG/3ML) 0.083% IN NEBU
10.0000 mg/h | INHALATION_SOLUTION | RESPIRATORY_TRACT | Status: DC
  Administered 2021-09-27 (×2): 10 mg/h via RESPIRATORY_TRACT
  Filled 2021-09-27 (×2): qty 12

## 2021-09-27 MED ORDER — CHLORHEXIDINE GLUCONATE CLOTH 2 % EX PADS
6.0000 | MEDICATED_PAD | Freq: Every day | CUTANEOUS | Status: DC
  Administered 2021-09-28: 6 via TOPICAL

## 2021-09-27 MED ORDER — LORAZEPAM 2 MG/ML IJ SOLN
2.0000 mg | Freq: Once | INTRAMUSCULAR | Status: AC
  Administered 2021-09-27: 2 mg via INTRAVENOUS
  Filled 2021-09-27: qty 1

## 2021-09-27 MED ORDER — CLONIDINE HCL 0.2 MG PO TABS
0.2000 mg | ORAL_TABLET | Freq: Two times a day (BID) | ORAL | Status: DC
  Administered 2021-09-27 – 2021-10-01 (×6): 0.2 mg via ORAL
  Filled 2021-09-27 (×7): qty 1

## 2021-09-27 MED ORDER — WHITE PETROLATUM EX OINT
TOPICAL_OINTMENT | CUTANEOUS | Status: DC | PRN
  Filled 2021-09-27 (×2): qty 28.35

## 2021-09-27 MED ORDER — MAGNESIUM SULFATE 2 GM/50ML IV SOLN
2.0000 g | Freq: Once | INTRAVENOUS | Status: AC
  Administered 2021-09-27: 2 g via INTRAVENOUS
  Filled 2021-09-27: qty 50

## 2021-09-27 MED ORDER — RACEPINEPHRINE HCL 2.25 % IN NEBU
0.5000 mL | INHALATION_SOLUTION | Freq: Once | RESPIRATORY_TRACT | Status: AC
  Administered 2021-09-27: 0.5 mL via RESPIRATORY_TRACT
  Filled 2021-09-27: qty 0.5

## 2021-09-27 MED ORDER — ARFORMOTEROL TARTRATE 15 MCG/2ML IN NEBU
15.0000 ug | INHALATION_SOLUTION | Freq: Two times a day (BID) | RESPIRATORY_TRACT | Status: DC
  Administered 2021-09-27 – 2021-10-01 (×8): 15 ug via RESPIRATORY_TRACT
  Filled 2021-09-27 (×10): qty 2

## 2021-09-27 MED ORDER — IPRATROPIUM-ALBUTEROL 0.5-2.5 (3) MG/3ML IN SOLN: 3.0000 mL | RESPIRATORY_TRACT | Status: DC

## 2021-09-27 NOTE — Progress Notes (Addendum)
Pt states she couldn't tolerate CPAP, SpO2 96% when RT entered room for scheduled tx on 4L [place]. Pt still with audible upper airway wheeze. Scattered wheeze noted R>L, and diminished bbs. Scheduled neb tx's given overnight plus one PRN neb around midnight. Tachypnea has greatly improved since the beginning of the shift, but wheeze remains about the same. RT will monitor.

## 2021-09-27 NOTE — Progress Notes (Signed)
RT note: RT administered Heilox via NRB mask. Patient did not tolerate and sats dropped to 74%. Patient placed back on BIPAP and patient sats increased to 97% on 30%. MD and beside RN aware.

## 2021-09-27 NOTE — Progress Notes (Signed)
PROGRESS NOTE    JYRAH BLYE  AQT:[id number] DOB: 03/10/1966 DOA: 09/25/2021 PCP: Hoy Register, MD    Brief Narrative:  55 year old with history of chronic moderate asthma, anemia, type 2 diabetes, hypertension, GERD and obesity presented with ongoing shortness of breath for about 3 days.  She tried albuterol treatment at home with no improvement.  No other preceding events.  She thinks she had some dental work done a week ago and that might have triggered it.  In the emergency room hemodynamically stable.  Significant bronchospasm not relieved with treatment in the ER so requested admission.  Assessment & Plan:   Principal Problem:   Asthma exacerbation Active Problems:   Diabetes mellitus without complication (HCC)  Acute asthma exacerbation in a patient with chronic moderate asthma: Agree with admission to monitored unit because of severity of symptoms. Aggressive bronchodilator therapy, IV steroids, inhalational steroids, scheduled and as needed bronchodilators, deep breathing exercises, incentive spirometry, chest physiotherapy and respiratory therapy consult.  Mucolytic's.  Supplemental oxygen to keep saturations more than 92%. COVID and influenza negative.   Type 2 diabetes without complications: Well-controlled at home on metformin.  Currently using high-dose steroids.  Keep on sliding scale insulin.  Will resume metformin on discharge.  Hypertension: On clonidine at home.  Resume.  Obstructive sleep apnea: Unable to use CPAP due to shortness of breath and suffocation.  She will use as much as she can.   DVT prophylaxis: enoxaparin (LOVENOX) injection 30 mg Start: 09/26/21 1000   Code Status: Full code Family Communication: None Disposition Plan: Status is: Observation  The patient will require care spanning > 2 midnights and should be moved to inpatient because: Significant bronchospasm needing frequent bronchodilator therapy, evaluated by respiratory therapy and  IV steroids.        Consultants:  None  Procedures:  None  Antimicrobials:  None   Subjective:  Patient seen and examined.  Had episode of severe bronchospasm in the morning and relieved with DuoNeb's.  Still feels very tight and anxious.  No other overnight events.  Objective: Vitals:   09/27/21 0405 09/27/21 0740 09/27/21 0802 09/27/21 1321  BP: (!) 143/69  (!) 151/87 135/74  Pulse: (!) 103 85 97 (!) 101  Resp: 20 20  20   Temp: 97.9 F (36.6 C)  98.3 F (36.8 C) 98.2 F (36.8 C)  TempSrc: Oral   Oral  SpO2: 94% 100% 98% 99%  Weight:      Height:       No intake or output data in the 24 hours ending 09/27/21 1328 Filed Weights   09/26/21 1434  Weight: 103.4 kg    Examination:  General exam: Appears anxious with bronchospasm.  Mostly on 2 to 3 L oxygen. Respiratory system: Bilateral poor air entry and both inspiratory and expiratory wheezes all over the lung fields. Cardiovascular system: S1 & S2 heard, RRR. No JVD, murmurs, rubs, gallops or clicks. No pedal edema. Gastrointestinal system: Abdomen is nondistended, soft and nontender. No organomegaly or masses felt. Normal bowel sounds heard. Central nervous system: Alert and oriented. No focal neurological deficits. Extremities: Symmetric 5 x 5 power. Skin: No rashes, lesions or ulcers Psychiatry: Judgement and insight appear normal.  Anxious.    Data Reviewed: I have personally reviewed following labs and imaging studies  CBC: Recent Labs  Lab 09/25/21 2050 09/26/21 0332  WBC 7.9 5.8  NEUTROABS 5.9  --   HGB 14.3 14.3  HCT 44.3 44.4  MCV 87.2 88.3  PLT 269 297  Basic Metabolic Panel: Recent Labs  Lab 09/25/21 2050 09/26/21 0332  NA 140 137  K 3.9 3.9  CL 106 102  CO2 28 26  GLUCOSE 134* 258*  BUN 17 15  CREATININE 1.20* 1.17*  CALCIUM 8.2* 8.2*   GFR: Estimated Creatinine Clearance: 64.1 mL/min (A) (by C-G formula based on SCr of 1.17 mg/dL (H)). Liver Function Tests: No results  for input(s): AST, ALT, ALKPHOS, BILITOT, PROT, ALBUMIN in the last 168 hours. No results for input(s): LIPASE, AMYLASE in the last 168 hours. No results for input(s): AMMONIA in the last 168 hours. Coagulation Profile: No results for input(s): INR, PROTIME in the last 168 hours. Cardiac Enzymes: No results for input(s): CKTOTAL, CKMB, CKMBINDEX, TROPONINI in the last 168 hours. BNP (last 3 results) No results for input(s): PROBNP in the last 8760 hours. HbA1C: No results for input(s): HGBA1C in the last 72 hours. CBG: Recent Labs  Lab 09/26/21 1826 09/26/21 2021 09/27/21 0622 09/27/21 0755 09/27/21 1203  GLUCAP 146* 143* 102* 103* 86   Lipid Profile: No results for input(s): CHOL, HDL, LDLCALC, TRIG, CHOLHDL, LDLDIRECT in the last 72 hours. Thyroid Function Tests: No results for input(s): TSH, T4TOTAL, FREET4, T3FREE, THYROIDAB in the last 72 hours. Anemia Panel: No results for input(s): VITAMINB12, FOLATE, FERRITIN, TIBC, IRON, RETICCTPCT in the last 72 hours. Sepsis Labs: No results for input(s): PROCALCITON, LATICACIDVEN in the last 168 hours.  Recent Results (from the past 240 hour(s))  Resp Panel by RT-PCR (Flu A&B, Covid) Nasopharyngeal Swab     Status: None   Collection Time: 09/25/21  8:51 PM   Specimen: Nasopharyngeal Swab; Nasopharyngeal(NP) swabs in vial transport medium  Result Value Ref Range Status   SARS Coronavirus 2 by RT PCR NEGATIVE NEGATIVE Final    Comment: (NOTE) SARS-CoV-2 target nucleic acids are NOT DETECTED.  The SARS-CoV-2 RNA is generally detectable in upper respiratory specimens during the acute phase of infection. The lowest concentration of SARS-CoV-2 viral copies this assay can detect is 138 copies/mL. A negative result does not preclude SARS-Cov-2 infection and should not be used as the sole basis for treatment or other patient management decisions. A negative result may occur with  improper specimen collection/handling, submission of  specimen other than nasopharyngeal swab, presence of viral mutation(s) within the areas targeted by this assay, and inadequate number of viral copies(<138 copies/mL). A negative result must be combined with clinical observations, patient history, and epidemiological information. The expected result is Negative.  Fact Sheet for Patients:  BloggerCourse.com  Fact Sheet for Healthcare Providers:  SeriousBroker.it  This test is no t yet approved or cleared by the [place] FDA and  has been authorized for detection and/or diagnosis of SARS-CoV-2 by FDA under an Emergency Use Authorization (EUA). This EUA will remain  in effect (meaning this test can be used) for the duration of the COVID-19 declaration under Section 564(b)(1) of the Act, 21 U.S.C.section 360bbb-3(b)(1), unless the authorization is terminated  or revoked sooner.       Influenza A by PCR NEGATIVE NEGATIVE Final   Influenza B by PCR NEGATIVE NEGATIVE Final    Comment: (NOTE) The Xpert Xpress SARS-CoV-2/FLU/RSV plus assay is intended as an aid in the diagnosis of influenza from Nasopharyngeal swab specimens and should not be used as a sole basis for treatment. Nasal washings and aspirates are unacceptable for Xpert Xpress SARS-CoV-2/FLU/RSV testing.  Fact Sheet for Patients: BloggerCourse.com  Fact Sheet for Healthcare Providers: SeriousBroker.it  This test is not yet  approved or cleared by the [place] and has been authorized for detection and/or diagnosis of SARS-CoV-2 by FDA under an Emergency Use Authorization (EUA). This EUA will remain in effect (meaning this test can be used) for the duration of the COVID-19 declaration under Section 564(b)(1) of the Act, 21 U.S.C. section 360bbb-3(b)(1), unless the authorization is terminated or revoked.  Performed at [facility], [address],  [place], [place] [address]          Radiology Studies: DG Chest 2 View  Result Date: 09/25/2021 CLINICAL DATA:  Shortness of breath and wheezing. EXAM: CHEST - 2 VIEW COMPARISON:  March 31, 2021 FINDINGS: The heart size and mediastinal contours are within normal limits. No focal consolidation. No pleural effusion. No pneumothorax. Chronic irregularity of the distal right clavicle likely related to prior trauma. IMPRESSION: No active cardiopulmonary disease. Electronically Signed   By: Maudry Mayhew M.D.   On: 09/25/2021 21:31   DG CHEST PORT 1 VIEW  Result Date: 09/27/2021 CLINICAL DATA:  Shortness of breath. EXAM: PORTABLE CHEST 1 VIEW COMPARISON:  September 25, 2021. FINDINGS: The heart size and mediastinal contours are within normal limits. Both lungs are clear. The visualized skeletal structures are unremarkable. IMPRESSION: No active disease. Electronically Signed   By: Lupita Raider M.D.   On: 09/27/2021 09:09        Scheduled Meds:  aspirin EC  81 mg Oral Daily   cloNIDine  0.2 mg Oral BID   enoxaparin (LOVENOX) injection  30 mg Subcutaneous Daily   insulin aspart  0-15 Units Subcutaneous TID WC   ipratropium-albuterol  3 mL Nebulization Q6H   loratadine  10 mg Oral Daily   methylPREDNISolone (SOLU-MEDROL) injection  80 mg Intravenous Q24H   mometasone-formoterol  2 puff Inhalation BID   Continuous Infusions:   LOS: 0 days    Time spent: 30 minutes    Dorcas Carrow, MD [facility] Pager [phone]

## 2021-09-27 NOTE — Progress Notes (Signed)
eLink Physician-Brief Progress Note Patient Name: Kathleen Rodriguez DOB: 10-Sep-1966 MRN: [id number]   Date of Service  09/27/2021  HPI/Events of Note  Patient needs a PRN medication for blood pressure control.  eICU Interventions  PRN Hydralazine ordered.        Thomasene Lot [name] 09/27/2021, 11:19 PM

## 2021-09-27 NOTE — Progress Notes (Signed)
RT called by RN (@[phone] ) to see the pt for tachypnea and wheezing. RT with another pt on a different floor at time of call, but said I would be there as soon as I could be. RT to pts room, and tx being administered. Pt clammy and tachypneic with audible wheeze. SpO2 91% on 2L [place]. Pt provided fan and ice pack by RN per pt's request, and O2 increased to 4L [place] for comfort. RT will monitor.   Update: (2350) RT to room to set up QHS CPAP. Upon arrival, Pt sleeping, with audible wheeze. PRN neb tx given at this time and pt placed on CPAP. Pt appeared comfortable on CPAP 5cmH20 (per pt's home reg) with FFM and 4L O2 bled in. RT will continue to monitor.

## 2021-09-27 NOTE — Consult Note (Signed)
NAME:  Kathleen Rodriguez, MRN:  [id number], DOB:  09-19-1966, LOS: 0 ADMISSION DATE:  09/25/2021, CONSULTATION DATE:  09/27/2021 REFERRING MD:  Sloan Leiter - TR[facility], CHIEF COMPLAINT:  worsening asthma.   History of Present Illness:  55 year old woman who has been experiencing increasing dyspnea since a tooth extraction one week ago. Her GERD has also worsened.   She has known asthma followed by Dr Halford Chessman and idiopathic cough.   Admitted for persistent respiratory symptoms.  Pertinent  Medical History   Past Medical History:  Diagnosis Date   Arthritis    "knees, lower back; legs, ankles" (01/27/2016)   Asthma    followed by Dr. 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   Diabetes mellitus without complication ([place])    Dyspnea    GERD (gastroesophageal reflux disease)    Hypertension    "doctor took me off RX in 2016" (01/27/2016)   Hypertension    Influenza B April 4827   Complicated by multi-organ failure   OSA on CPAP "since " 03/20/2013   Pneumonia 2016   Required emergent intubation    asthma exacerbation in 2016   Skin ulcer ([place]) secondary to bullous impetigo /trauma 08/29/2018   Sleep apnea    Tobacco abuse    Upper airway cough syndrome      Significant Hospital Events: Including procedures, antibiotic start and stop dates in addition to other pertinent events   12/4 admitted to hospitalist service.   12/5 PCCM consulted for ongoing since  Interim History / Subjective:  On arrival patient had received a continuous nebulizer with ongoing respiratory distress  Objective   Blood pressure 135/74, pulse (!) 101, temperature 98.2 F (36.8 C), temperature source Oral, resp. rate 20, height '5\' 5"'  (1.651 m), weight 103.4 kg, SpO2 97 %.       No intake or output data in the 24 hours ending 09/27/21 1629 Filed Weights   09/26/21 1434  Weight: 103.4 kg     Examination: General: Sitting in bed both upright in moderate respiratory distress HENT: Audible upper airway sounds on expiration.  Patient able to speak clearly with a strong voice without dyspnea Lungs: Transmitted upper airway sounds moderate accessory muscle use Cardiovascular: Heart sounds unremarkable Abdomen: Soft and nontender Extremities: No edema Neuro: No focal neurological deficit GU: No Foley catheter  Ancillary tests personally reviewed:   Outpatient PFTs demonstrate moderate airflow obstruction with mild gas trapping and a mild diffusion defect No leukocytosis.  Normal hemoglobin.  Normal chemistry.  Normal glycemia  Assessment & Plan:  Acute respiratory insufficiency with hypoxia Acute vocal cord dysfunction Acute exacerbation of moderate asthma Class 2 obesity GERD Sleep apnea  Tobacco abuse.   Plan:   - I think that the patient's respiratory distress is mostly due to vocal cord dysfunction as airflow obstruction appeared to be predominantly upper airway. - It has improved with pursed lip breathing and relaxation techniques, cessation of nebulization and benzodiazepine administration.  - Continue steroids - Rule out viral RTI - Provide cool mist humidification.  - Lorazepam prn  - Continue PPI - may consider increasing GERD regimen: BID PPI and adding H2 blocker.  - Avoid excessive nebulizer treatments. - Short course of steroids as may aggravate reflux  - Follow up with Dr. Halford Chessman upon discharge -Encourage smoking cessation  PCCM will follow along  with you.  Labs   CBC: Recent Labs  Lab 09/25/21 2050 09/26/21 0332  WBC 7.9 5.8  NEUTROABS 5.9  --   HGB 14.3 14.3  HCT 44.3 44.4  MCV 87.2 88.3  PLT 269 761    Basic Metabolic Panel: Recent Labs  Lab 09/25/21 2050 09/26/21 0332  NA 140 137  K 3.9 3.9  CL 106 102  CO2 28 26  GLUCOSE 134* 258*  BUN 17 15  CREATININE 1.20* 1.17*  CALCIUM 8.2* 8.2*   GFR: Estimated Creatinine  Clearance: 64.1 mL/min (A) (by C-G formula based on SCr of 1.17 mg/dL (H)). Recent Labs  Lab 09/25/21 2050 09/26/21 0332  WBC 7.9 5.8    Liver Function Tests: No results for input(s): AST, ALT, ALKPHOS, BILITOT, PROT, ALBUMIN in the last 168 hours. No results for input(s): LIPASE, AMYLASE in the last 168 hours. No results for input(s): AMMONIA in the last 168 hours.  ABG    Component Value Date/Time   PHART 7.381 07/09/2017 1022   PCO2ART 44.6 07/09/2017 1022   PO2ART 159 (H) 07/09/2017 1022   HCO3 22.4 03/31/2021 0458   TCO2 26 10/20/2017 1436   ACIDBASEDEF 4.4 (H) 03/31/2021 0458   O2SAT 79.6 03/31/2021 0458     Coagulation Profile: No results for input(s): INR, PROTIME in the last 168 hours.  Cardiac Enzymes: No results for input(s): CKTOTAL, CKMB, CKMBINDEX, TROPONINI in the last 168 hours.  HbA1C: Hgb A1c MFr Bld  Date/Time Value Ref Range Status  03/31/2021 12:32 PM 5.9 (H) 4.8 - 5.6 % Final    Comment:    (NOTE)         Prediabetes: 5.7 - 6.4         Diabetes: >6.4         Glycemic control for adults with diabetes: <7.0   01/21/2021 03:04 AM 5.8 (H) 4.8 - 5.6 % Final    Comment:    (NOTE) Pre diabetes:          5.7%-6.4%  Diabetes:              >6.4%  Glycemic control for   <7.0% adults with diabetes     CBG: Recent Labs  Lab 09/26/21 1826 09/26/21 2021 09/27/21 0622 09/27/21 0755 09/27/21 1203  GLUCAP 146* 143* 102* 103* 86    Review of Systems:   Review of Systems  All other systems reviewed and are negative.   Past Medical History:  She,  has a past medical history of Arthritis, Asthma, Asthma, CHF (congestive heart failure) ([place]) (2016), Cocaine abuse ([place]), Critical illness myopathy (April 2014), Diabetes mellitus without complication ([place]), Dyspnea, GERD (gastroesophageal reflux disease), Hypertension, Hypertension, Influenza B (April 2014), OSA on CPAP ("since " 03/20/2013), Pneumonia (2016), Required emergent intubation, Skin ulcer  ([place]) secondary to bullous impetigo Marilynn Rail (08/29/2018), Sleep apnea, Tobacco abuse, and Upper airway cough syndrome.   Surgical History:   Past Surgical History:  Procedure Laterality Date   BREAST SURGERY Right 1980   as a teenager , cyst was benign   CESAREAN SECTION  2006   LACERATION REPAIR Right ~ 1997   "tried to cut myself"   TOTAL KNEE ARTHROPLASTY Left 09/01/2016   Procedure: LEFT TOTAL KNEE ARTHROPLASTY;  Surgeon: Leandrew Koyanagi, MD;  Location: [facility];  Service: Orthopedics;  Laterality: Left;   TUBAL LIGATION  2006     Social History:   reports that she quit smoking about 8 years ago. Her smoking use included cigarettes. She  started smoking about 29 years ago. She has a 5.25 pack-year smoking history. She has never used smokeless tobacco. She reports that she does not currently use drugs after having used the following drugs: Cocaine and "Crack" cocaine.   Family History:  Her family history includes Asthma in her mother; Cancer in her maternal grandmother and paternal grandmother; HIV/AIDS in her father; Heart disease in her maternal grandmother; Heart murmur in her mother; Hypertension in her maternal grandmother and mother.   Allergies Allergies  Allergen Reactions   Tomato Hives, Itching and Other (See Comments)    ALSO REACTS TO KETCHUP   Latex Itching and Rash   Wool Alcohol [Lanolin] Itching     Home Medications  Prior to Admission medications   Medication Sig Start Date End Date Taking? Authorizing Provider  acetaminophen (TYLENOL) 500 MG tablet Take 1,000 mg by mouth every 6 (six) hours as needed for headache (pain).   Yes [provider]  albuterol (PROVENTIL) (2.5 MG/3ML) 0.083% nebulizer solution Take 3 mLs (2.5 mg total) by nebulization every 6 (six) hours as needed for shortness of breath or wheezing. 04/02/21  Yes Shawna Clamp, MD  budesonide (PULMICORT) 0.25 MG/2ML nebulizer solution TAKE 1 VIAL (0.25 MG TOTAL) BY NEBULIZATION 2 (TWO) TIMES  DAILY. Patient taking differently: Take 0.25 mg by nebulization 2 (two) times daily as needed (shortness of breath/wheezing). 04/02/21 09/26/21 Yes Shawna Clamp, MD  gabapentin (NEURONTIN) 300 MG capsule Take 300 mg by mouth 2 (two) times daily. 09/22/21  Yes [provider]  metFORMIN (GLUCOPHAGE) 500 MG tablet TAKE 1 TABLET BY MOUTH 2 TIMES DAILY WITH A MEAL. Patient taking differently: Take 500 mg by mouth 2 (two) times daily with a meal. 05/26/21  Yes [name], MD  Multiple Vitamin (MULTIVITAMIN WITH MINERALS) TABS tablet Take 1 tablet by mouth daily.   Yes [provider]  Accu-Chek Softclix Lancets lancets Use to check blood sugar once daily. 05/04/21   [name], MD  albuterol (PROAIR HFA) 108 (90 Base) MCG/ACT inhaler TAKE 2 PUFFS BY MOUTH EVERY 6 HOURS AS NEEDED FOR WHEEZE OR SHORTNESS OF BREATH Patient not taking: Reported on 09/26/2021 04/02/21   Shawna Clamp, MD  aspirin 81 MG EC tablet Take 1 tablet (81 mg total) by mouth daily. Patient not taking: Reported on 09/26/2021 02/07/19   [name], MD  Blood Glucose Monitoring Suppl (ACCU-CHEK GUIDE ME) w/Device KIT Use to check blood sugar once daily. 05/04/21   [name], MD  cloNIDine (CATAPRES) 0.2 MG tablet Take 0.2 mg by mouth 2 (two) times daily. For BP and hot flashes Patient not taking: Reported on 09/26/2021    [provider]  EPINEPHrine 0.3 mg/0.3 mL IJ SOAJ injection Inject 0.3 mg into the muscle as needed for anaphylaxis. Patient not taking: Reported on 09/26/2021 05/06/21   Chesley Mires, MD  fluticasone [facility]) 50 MCG/ACT nasal spray Place 1 spray into both nostrils daily as needed for allergies. Patient not taking: Reported on 09/26/2021 01/04/21   Chesley Mires, MD  fluticasone-salmeterol (ADVAIR HFA) [phone] MCG/ACT inhaler Inhale 2 puffs into the lungs 2 (two) times daily. Patient not taking: Reported on 09/26/2021 04/27/21   Chesley Mires, MD  glucose blood (ACCU-CHEK GUIDE) test  strip Use as instructed daily before breakfast. 05/04/21   [name], MD  methocarbamol (ROBAXIN) 500 MG tablet TAKE 1 TABLET (500 MG TOTAL) BY MOUTH EVERY 8 (EIGHT) HOURS AS NEEDED FOR MUSCLE SPASMS. Patient not taking: Reported on 09/26/2021 05/28/21   Asencion Noble  E, MD  Misc. Devices MISC Blood pressure monitor  Dx: Hypertension 02/07/19   [name], MD  Misc. Devices MISC Depends . Wt- 250 lbs, size 1X; [place]ise pads 03/23/21   [name], MD  oxyCODONE-acetaminophen (PERCOCET/ROXICET) 5-325 MG tablet Take 1 tablet by mouth every 6 (six) hours as needed for up to 8 doses for severe pain. Patient not taking: Reported on 09/26/2021 06/17/21   Luna Fuse, MD    Kipp Brood, MD [facility] ICU Physician [place]  Pager: [phone] Or Epic Secure Chat After hours: [phone].  09/27/2021, 4:29 PM

## 2021-09-27 NOTE — Progress Notes (Signed)
eLink Physician-Brief Progress Note Patient Name: Kathleen Rodriguez DOB: 1965/12/02 MRN: [id number]   Date of Service  09/27/2021  HPI/Events of Note  Patient admitted to the medical floor with exacerbation of asthma then transferred to the ICU due to concern for respiratory distress from upper airway disease, r/o vocal cord dysfunction, however flexible laryngoscopy by ENT did not find any upper airway abnormalities, she is currently on BIPAP.  eICU Interventions  New Patient Evaluation.        Thomasene Lot Farhad Burleson 09/27/2021, 8:17 PM

## 2021-09-27 NOTE — Progress Notes (Signed)
Patient oxygen saturation down to 85% as patient removed CPAP and did not use her [place]. RN explained to the patient the importance of CPAP, Patient complaint of very dry mouth with the use of CPAP and do not want to use CPAP right now. RN placed patient on oxygen 4L/[place] with oxygen saturation steadily increased to 90% up to 93%.

## 2021-09-27 NOTE — Progress Notes (Signed)
Called to bedside for ongoing wheezing.  Exam consistent with prior exam - increased upper airway wheezing, suspected referred wheezing to lung fields.  Saturations 98-100%.  Patient able to do pursed lip breathing and quiet wheezing to some degree.  She had recent dental work and has sutures in her mouth from her tooth extraction.  Of note, her son had a recent viral illness.  She has a large tongue on exam, unable to see posterior airway on bedside exam.  Face symmetrical. No obvious edema or neck swelling.  She reports her throat feels raw.  Reports she felt some better after ativan.  Vitals & mental status remain stable.  She previously received 2gm Mg on prior exam.   Plan: -ENT consulted, greatly appreciate input -25 mg IV benadryl now  -trial of positive pressure support for airway splinting  -consider imaging of neck to evaluate soft tissues pending ENT evaluation -racemic epi now     Kathleen Brim, MSN, APRN, NP-C, AGACNP-BC [facility] 09/27/2021, 7:18 PM   Please see [email or web address] for pager details.   From 7A-7P if no response, please call [phone] After hours, please call ELink [phone]

## 2021-09-27 NOTE — Consult Note (Signed)
Reason for Consult: Airway compromise Referring Physician: Dorcas Carrow, MD  Kathleen Rodriguez is an 55 y.o. female.  HPI: Long history of asthma.  History of reflux.  She has had history of tobacco use and has had to be intubated for airway distress in 2016.  She has been having difficulty with her breathing today on and off.  She was just put on BiPAP and has been treated with multiple medications and seems to be doing a little bit better.  There was concern about possible upper airway noise and possible airway obstruction.  She did have a dental procedure done in the past week or so.  She does have a history of obesity and obstructive sleep apnea.  Past Medical History:  Diagnosis Date   Arthritis    "knees, lower back; legs, ankles" (01/27/2016)   Asthma    followed by Dr. 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   Diabetes mellitus without complication (HCC)    Dyspnea    GERD (gastroesophageal reflux disease)    Hypertension    "doctor took me off RX in 2016" (01/27/2016)   Hypertension    Influenza B April 2014   Complicated by multi-organ failure   OSA on CPAP "since " 03/20/2013   Pneumonia 2016   Required emergent intubation    asthma exacerbation in 2016   Skin ulcer (HCC) secondary to bullous impetigo /trauma 08/29/2018   Sleep apnea    Tobacco abuse    Upper airway cough syndrome     Past Surgical History:  Procedure Laterality Date   BREAST SURGERY Right 1980   as a teenager , cyst was benign   CESAREAN SECTION  2006   LACERATION REPAIR Right ~ 1997   "tried to cut myself"   TOTAL KNEE ARTHROPLASTY Left 09/01/2016   Procedure: LEFT TOTAL KNEE ARTHROPLASTY;  Surgeon: Tarry Kos, MD;  Location: [facility] OR;  Service: Orthopedics;  Laterality: Left;   TUBAL LIGATION  2006    Family History  Problem Relation Rodriguez of Onset   Asthma Mother    Heart murmur Mother     Cancer Maternal Grandmother    Heart disease Maternal Grandmother    Hypertension Maternal Grandmother    Cancer Paternal Grandmother    Hypertension Mother    HIV/AIDS Father     Social History:  reports that she quit smoking about 8 years ago. Her smoking use included cigarettes. She started smoking about 29 years ago. She has a 5.25 pack-year smoking history. She has never used smokeless tobacco. She reports that she does not currently use drugs after having used the following drugs: Cocaine and "Crack" cocaine. No history on file for alcohol use.  Allergies:  Allergies  Allergen Reactions   Tomato Hives, Itching and Other (See Comments)    ALSO REACTS TO KETCHUP   Latex Itching and Rash   Wool Alcohol [Lanolin] Itching    Medications: Reviewed  Results for orders placed or performed during the hospital encounter of 09/25/21 (from the past 48 hour(s))  Basic metabolic panel     Status: Abnormal   Collection Time: 09/25/21  8:50 PM  Result Value Ref Range   Sodium 140 135 - 145 mmol/L   Potassium 3.9 3.5 - 5.1 mmol/L   Chloride 106 98 - 111 mmol/L   CO2 28 22 -  32 mmol/L   Glucose, Bld 134 (H) 70 - 99 mg/dL    Comment: Glucose reference range applies only to samples taken after fasting for at least 8 hours.   BUN 17 6 - 20 mg/dL   Creatinine, Ser 1.61 (H) 0.44 - 1.00 mg/dL   Calcium 8.2 (L) 8.9 - 10.3 mg/dL   GFR, Estimated 53 (L) >60 mL/min    Comment: (NOTE) Calculated using the CKD-EPI Creatinine Equation (2021)    Anion gap 6 5 - 15    Comment: Performed at [facility], [address], [place], [place] [address]  CBC with Differential     Status: Abnormal   Collection Time: 09/25/21  8:50 PM  Result Value Ref Range   WBC 7.9 4.0 - 10.5 K/uL   RBC 5.08 3.87 - 5.11 MIL/uL   Hemoglobin 14.3 12.0 - 15.0 g/dL   HCT 54.0 98.1 - 19.1 %   MCV 87.2 80.0 - 100.0 fL   MCH 28.1 26.0 - 34.0 pg   MCHC 32.3 30.0 - 36.0 g/dL   RDW 47.8 (H) 29.5 - 62.1 %   Platelets 269  150 - 400 K/uL   nRBC 0.0 0.0 - 0.2 %   Neutrophils Relative % 75 %   Neutro Abs 5.9 1.7 - 7.7 K/uL   Lymphocytes Relative 13 %   Lymphs Abs 1.0 0.7 - 4.0 K/uL   Monocytes Relative 8 %   Monocytes Absolute 0.6 0.1 - 1.0 K/uL   Eosinophils Relative 3 %   Eosinophils Absolute 0.3 0.0 - 0.5 K/uL   Basophils Relative 1 %   Basophils Absolute 0.1 0.0 - 0.1 K/uL   Immature Granulocytes 0 %   Abs Immature Granulocytes 0.02 0.00 - 0.07 K/uL    Comment: Performed at [facility], [address], [place], [place] [address]  Resp Panel by RT-PCR (Flu A&B, Covid) Nasopharyngeal Swab     Status: None   Collection Time: 09/25/21  8:51 PM   Specimen: Nasopharyngeal Swab; Nasopharyngeal(NP) swabs in vial transport medium  Result Value Ref Range   SARS Coronavirus 2 by RT PCR NEGATIVE NEGATIVE    Comment: (NOTE) SARS-CoV-2 target nucleic acids are NOT DETECTED.  The SARS-CoV-2 RNA is generally detectable in upper respiratory specimens during the acute phase of infection. The lowest concentration of SARS-CoV-2 viral copies this assay can detect is 138 copies/mL. A negative result does not preclude SARS-Cov-2 infection and should not be used as the sole basis for treatment or other patient management decisions. A negative result may occur with  improper specimen collection/handling, submission of specimen other than nasopharyngeal swab, presence of viral mutation(s) within the areas targeted by this assay, and inadequate number of viral copies(<138 copies/mL). A negative result must be combined with clinical observations, patient history, and epidemiological information. The expected result is Negative.  Fact Sheet for Patients:  BloggerCourse.com  Fact Sheet for Healthcare Providers:  SeriousBroker.it  This test is no t yet approved or cleared by the [place] FDA and  has been authorized for detection and/or diagnosis of SARS-CoV-2  by FDA under an Emergency Use Authorization (EUA). This EUA will remain  in effect (meaning this test can be used) for the duration of the COVID-19 declaration under Section 564(b)(1) of the Act, 21 U.S.C.section 360bbb-3(b)(1), unless the authorization is terminated  or revoked sooner.       Influenza A by PCR NEGATIVE NEGATIVE   Influenza B by PCR NEGATIVE NEGATIVE  Comment: (NOTE) The Xpert Xpress SARS-CoV-2/FLU/RSV plus assay is intended as an aid in the diagnosis of influenza from Nasopharyngeal swab specimens and should not be used as a sole basis for treatment. Nasal washings and aspirates are unacceptable for Xpert Xpress SARS-CoV-2/FLU/RSV testing.  Fact Sheet for Patients: BloggerCourse.com  Fact Sheet for Healthcare Providers: SeriousBroker.it  This test is not yet approved or cleared by the [place] FDA and has been authorized for detection and/or diagnosis of SARS-CoV-2 by FDA under an Emergency Use Authorization (EUA). This EUA will remain in effect (meaning this test can be used) for the duration of the COVID-19 declaration under Section 564(b)(1) of the Act, 21 U.S.C. section 360bbb-3(b)(1), unless the authorization is terminated or revoked.  Performed at [facility], [address], [place], [place] [address]   Respiratory (~20 pathogens) panel by PCR     Status: Abnormal   Collection Time: 09/25/21  8:51 PM   Specimen: Nasopharyngeal Swab; Respiratory  Result Value Ref Range   Adenovirus NOT DETECTED NOT DETECTED   Coronavirus 229E NOT DETECTED NOT DETECTED    Comment: (NOTE) The Coronavirus on the Respiratory Panel, DOES NOT test for the novel  Coronavirus (2019 nCoV)    Coronavirus HKU1 NOT DETECTED NOT DETECTED   Coronavirus NL63 NOT DETECTED NOT DETECTED   Coronavirus OC43 NOT DETECTED NOT DETECTED   Metapneumovirus NOT DETECTED NOT DETECTED   Rhinovirus / Enterovirus NOT DETECTED NOT  DETECTED   Influenza A NOT DETECTED NOT DETECTED   Influenza B NOT DETECTED NOT DETECTED   Parainfluenza Virus 1 NOT DETECTED NOT DETECTED   Parainfluenza Virus 2 NOT DETECTED NOT DETECTED   Parainfluenza Virus 3 NOT DETECTED NOT DETECTED   Parainfluenza Virus 4 DETECTED (A) NOT DETECTED   Respiratory Syncytial Virus NOT DETECTED NOT DETECTED   Bordetella pertussis NOT DETECTED NOT DETECTED   Bordetella Parapertussis NOT DETECTED NOT DETECTED   Chlamydophila pneumoniae NOT DETECTED NOT DETECTED   Mycoplasma pneumoniae NOT DETECTED NOT DETECTED    Comment: Performed at [facility], [address] [place], [place] [address]  Brain natriuretic peptide     Status: None   Collection Time: 09/25/21  9:40 PM  Result Value Ref Range   B Natriuretic Peptide 10.4 0.0 - 100.0 pg/mL    Comment: Performed at [facility], [address], [place], [place] [address]  Basic metabolic panel     Status: Abnormal   Collection Time: 09/26/21  3:32 AM  Result Value Ref Range   Sodium 137 135 - 145 mmol/L   Potassium 3.9 3.5 - 5.1 mmol/L   Chloride 102 98 - 111 mmol/L   CO2 26 22 - 32 mmol/L   Glucose, Bld 258 (H) 70 - 99 mg/dL    Comment: Glucose reference range applies only to samples taken after fasting for at least 8 hours.   BUN 15 6 - 20 mg/dL   Creatinine, Ser 7.40 (H) 0.44 - 1.00 mg/dL   Calcium 8.2 (L) 8.9 - 10.3 mg/dL   GFR, Estimated 55 (L) >60 mL/min    Comment: (NOTE) Calculated using the CKD-EPI Creatinine Equation (2021)    Anion gap 9 5 - 15    Comment: Performed at [facility], [address], [place], [place] [address]  CBC     Status: Abnormal   Collection Time: 09/26/21  3:32 AM  Result Value Ref Range   WBC 5.8 4.0 - 10.5 K/uL   RBC 5.03  3.87 - 5.11 MIL/uL   Hemoglobin 14.3 12.0 - 15.0 g/dL   HCT 01.0 27.2 - 53.6 %   MCV 88.3 80.0 - 100.0 fL   MCH 28.4 26.0 - 34.0 pg   MCHC 32.2 30.0 - 36.0 g/dL   RDW 64.4 (H) 03.4 - 74.2 %   Platelets 297 150 - 400  K/uL   nRBC 0.0 0.0 - 0.2 %    Comment: Performed at [facility], [address], [place], [place] [address]  CBG monitoring, ED     Status: Abnormal   Collection Time: 09/26/21  7:54 AM  Result Value Ref Range   Glucose-Capillary 116 (H) 70 - 99 mg/dL    Comment: Glucose reference range applies only to samples taken after fasting for at least 8 hours.  CBG monitoring, ED     Status: Abnormal   Collection Time: 09/26/21 11:45 AM  Result Value Ref Range   Glucose-Capillary 111 (H) 70 - 99 mg/dL    Comment: Glucose reference range applies only to samples taken after fasting for at least 8 hours.  Glucose, capillary     Status: Abnormal   Collection Time: 09/26/21  2:35 PM  Result Value Ref Range   Glucose-Capillary 161 (H) 70 - 99 mg/dL    Comment: Glucose reference range applies only to samples taken after fasting for at least 8 hours.  Glucose, capillary     Status: Abnormal   Collection Time: 09/26/21  6:26 PM  Result Value Ref Range   Glucose-Capillary 146 (H) 70 - 99 mg/dL    Comment: Glucose reference range applies only to samples taken after fasting for at least 8 hours.  Glucose, capillary     Status: Abnormal   Collection Time: 09/26/21  8:21 PM  Result Value Ref Range   Glucose-Capillary 143 (H) 70 - 99 mg/dL    Comment: Glucose reference range applies only to samples taken after fasting for at least 8 hours.  Glucose, capillary     Status: Abnormal   Collection Time: 09/27/21  6:22 AM  Result Value Ref Range   Glucose-Capillary 102 (H) 70 - 99 mg/dL    Comment: Glucose reference range applies only to samples taken after fasting for at least 8 hours.  Glucose, capillary     Status: Abnormal   Collection Time: 09/27/21  7:55 AM  Result Value Ref Range   Glucose-Capillary 103 (H) 70 - 99 mg/dL    Comment: Glucose reference range applies only to samples taken after fasting for at least 8 hours.  Glucose, capillary     Status: None   Collection Time: 09/27/21 12:03  PM  Result Value Ref Range   Glucose-Capillary 86 70 - 99 mg/dL    Comment: Glucose reference range applies only to samples taken after fasting for at least 8 hours.    DG Chest 2 View  Result Date: 09/25/2021 CLINICAL DATA:  Shortness of breath and wheezing. EXAM: CHEST - 2 VIEW COMPARISON:  March 31, 2021 FINDINGS: The heart size and mediastinal contours are within normal limits. No focal consolidation. No pleural effusion. No pneumothorax. Chronic irregularity of the distal right clavicle likely related to prior trauma. IMPRESSION: No active cardiopulmonary disease. Electronically Signed   By: Maudry Mayhew M.D.   On: 09/25/2021 21:31   DG CHEST PORT 1 VIEW  Result Date: 09/27/2021 CLINICAL DATA:  Shortness of breath. EXAM: PORTABLE CHEST 1 VIEW COMPARISON:  September 25, 2021. FINDINGS: The heart size and mediastinal contours  are within normal limits. Both lungs are clear. The visualized skeletal structures are unremarkable. IMPRESSION: No active disease. Electronically Signed   By: Lupita Raider M.D.   On: 09/27/2021 09:09    SNK:[id number] except as listed in admit H&P  Blood pressure 135/74, pulse (!) 119, temperature 98.2 F (36.8 C), temperature source Oral, resp. rate (!) 24, height 5\' 5"  (1.651 m), weight 103.4 kg, SpO2 93 %.  PHYSICAL EXAM: Overall appearance: Ill-appearing, obese lady, in respiratory distress, without audible stridor but she does have significant wheezing and air trapping clinically.  She is currently on BiPAP.  The BiPAP was removed during the evaluation for small periods of time. Head:  Normocephalic, atraumatic. Ears: External ears look normal. Nose: External nose is healthy in appearance. Internal nasal exam free of any lesions or obstruction. Oral Cavity/Pharynx:  There are no mucosal lesions or masses identified.  There is no dentition.  Tongue and floor mouth are without any edema. Larynx/Hypopharynx: See below Neuro:  No identifiable neurologic  deficits. Neck: No palpable neck masses.  Studies Reviewed: none  Procedures: Flexible fiberoptic laryngoscopy: Topical Xylocaine/Afrin was applied to the nasal cavities in spray form.  The fiberoptic scope was passed down the right nasal cavity into the nasopharynx, down the oropharynx and into the hypopharynx.  There is no evidence of airway obstruction.  The vocal cords move appropriately.  There is no masses identified.  The arytenoids are somewhat edematous and erythematous, consistent with reflux laryngitis.   Assessment/Plan: Probably severe asthmatic attack but no evidence of upper airway obstruction.  Exam is clear down to the larynx.  No need for ENT intervention unless there is a need for tracheostomy.  Contact as needed.  G47.33 E66.01 K21.9 J45.909 R06.03  [place] 09/27/2021, 7:33 PM

## 2021-09-27 NOTE — Progress Notes (Signed)
Patient arrived to unit at this time on Bipap with increased work of breathing. Patient currently oriented and able to follow commands. Patient frequently sits up in tripod position to breath better. Benadryl IV given. Patient with clothes in belongings,cell phone and charger as well as a gold necklace. Skin assessment performed with Clinical research associate and Nadeen Landau. Skin intact. Purewick placed on patient. Patient wiped down with CHG wipes. Patient oriented to unit and educated on how to use call light. Safety measures implemented and bed locked in lowest position. Will continue to monitor at this time.

## 2021-09-28 ENCOUNTER — Inpatient Hospital Stay (HOSPITAL_COMMUNITY): Payer: Medicaid Other

## 2021-09-28 DIAGNOSIS — J4541 Moderate persistent asthma with (acute) exacerbation: Secondary | ICD-10-CM

## 2021-09-28 DIAGNOSIS — J45909 Unspecified asthma, uncomplicated: Secondary | ICD-10-CM | POA: Diagnosis not present

## 2021-09-28 LAB — CBC WITH DIFFERENTIAL/PLATELET
Abs Immature Granulocytes: 0.01 10*3/uL (ref 0.00–0.07)
Basophils Absolute: 0 10*3/uL (ref 0.0–0.1)
Basophils Relative: 0 %
Eosinophils Absolute: 0 10*3/uL (ref 0.0–0.5)
Eosinophils Relative: 0 %
HCT: 41.9 % (ref 36.0–46.0)
Hemoglobin: 13.4 g/dL (ref 12.0–15.0)
Immature Granulocytes: 0 %
Lymphocytes Relative: 21 %
Lymphs Abs: 1.6 10*3/uL (ref 0.7–4.0)
MCH: 28.2 pg (ref 26.0–34.0)
MCHC: 32 g/dL (ref 30.0–36.0)
MCV: 88.2 fL (ref 80.0–100.0)
Monocytes Absolute: 0.8 10*3/uL (ref 0.1–1.0)
Monocytes Relative: 11 %
Neutro Abs: 5.2 10*3/uL (ref 1.7–7.7)
Neutrophils Relative %: 68 %
Platelets: 294 10*3/uL (ref 150–400)
RBC: 4.75 MIL/uL (ref 3.87–5.11)
RDW: 16.6 % — ABNORMAL HIGH (ref 11.5–15.5)
WBC: 7.6 10*3/uL (ref 4.0–10.5)
nRBC: 0 % (ref 0.0–0.2)

## 2021-09-28 LAB — GLUCOSE, CAPILLARY
Glucose-Capillary: 120 mg/dL — ABNORMAL HIGH (ref 70–99)
Glucose-Capillary: 136 mg/dL — ABNORMAL HIGH (ref 70–99)
Glucose-Capillary: 136 mg/dL — ABNORMAL HIGH (ref 70–99)
Glucose-Capillary: 139 mg/dL — ABNORMAL HIGH (ref 70–99)
Glucose-Capillary: 169 mg/dL — ABNORMAL HIGH (ref 70–99)

## 2021-09-28 LAB — BASIC METABOLIC PANEL
Anion gap: 8 (ref 5–15)
BUN: 19 mg/dL (ref 6–20)
CO2: 26 mmol/L (ref 22–32)
Calcium: 8.3 mg/dL — ABNORMAL LOW (ref 8.9–10.3)
Chloride: 101 mmol/L (ref 98–111)
Creatinine, Ser: 0.96 mg/dL (ref 0.44–1.00)
GFR, Estimated: >60 mL/min (ref >60)
Glucose, Bld: 135 mg/dL — ABNORMAL HIGH (ref 70–99)
Potassium: 4.1 mmol/L (ref 3.5–5.1)
Sodium: 135 mmol/L (ref 135–145)

## 2021-09-28 LAB — MAGNESIUM: Magnesium: 2.4 mg/dL (ref 1.7–2.4)

## 2021-09-28 LAB — PHOSPHORUS: Phosphorus: 5.1 mg/dL — ABNORMAL HIGH (ref 2.5–4.6)

## 2021-09-28 MED ORDER — INSULIN ASPART 100 UNIT/ML IJ SOLN: 0.0000 [IU] | INTRAMUSCULAR | Status: DC

## 2021-09-28 MED ORDER — SODIUM CHLORIDE 0.9 % IV SOLN: INTRAVENOUS | Status: DC | PRN

## 2021-09-28 MED ORDER — INSULIN ASPART 100 UNIT/ML IJ SOLN
0.0000 [IU] | INTRAMUSCULAR | Status: DC
  Administered 2021-09-28 – 2021-09-29 (×4): 1 [IU] via SUBCUTANEOUS
  Administered 2021-09-29 – 2021-09-30 (×2): 2 [IU] via SUBCUTANEOUS

## 2021-09-28 MED ORDER — ALBUTEROL SULFATE (2.5 MG/3ML) 0.083% IN NEBU
2.5000 mg | INHALATION_SOLUTION | RESPIRATORY_TRACT | Status: DC
  Administered 2021-09-28 – 2021-10-01 (×16): 2.5 mg via RESPIRATORY_TRACT
  Filled 2021-09-28 (×16): qty 3

## 2021-09-28 MED ORDER — CHLORHEXIDINE GLUCONATE CLOTH 2 % EX PADS
6.0000 | MEDICATED_PAD | Freq: Every day | CUTANEOUS | Status: DC
  Administered 2021-09-28 – 2021-09-30 (×2): 6 via TOPICAL

## 2021-09-28 MED ORDER — METHYLPREDNISOLONE SODIUM SUCC 40 MG IJ SOLR
40.0000 mg | Freq: Two times a day (BID) | INTRAMUSCULAR | Status: AC
  Administered 2021-09-28 – 2021-09-30 (×6): 40 mg via INTRAVENOUS
  Filled 2021-09-28 (×6): qty 1

## 2021-09-28 MED ORDER — PANTOPRAZOLE SODIUM 40 MG IV SOLR
40.0000 mg | INTRAVENOUS | Status: DC
  Administered 2021-09-28 – 2021-09-29 (×2): 40 mg via INTRAVENOUS
  Filled 2021-09-28 (×2): qty 40

## 2021-09-28 MED ORDER — FLUTICASONE PROPIONATE 50 MCG/ACT NA SUSP
1.0000 | Freq: Every day | NASAL | Status: DC
  Administered 2021-09-29 – 2021-09-30 (×2): 1 via NASAL
  Filled 2021-09-28: qty 16

## 2021-09-28 MED ORDER — TERBUTALINE SULFATE 1 MG/ML IJ SOLN
0.2500 mg | Freq: Once | INTRAMUSCULAR | Status: AC
  Administered 2021-09-28: 0.25 mg via SUBCUTANEOUS
  Filled 2021-09-28: qty 0.25

## 2021-09-28 MED ORDER — ORAL CARE MOUTH RINSE
15.0000 mL | Freq: Two times a day (BID) | OROMUCOSAL | Status: DC
  Administered 2021-09-28 – 2021-09-30 (×4): 15 mL via OROMUCOSAL

## 2021-09-28 MED ORDER — ENOXAPARIN SODIUM 60 MG/0.6ML IJ SOSY
50.0000 mg | PREFILLED_SYRINGE | Freq: Every day | INTRAMUSCULAR | Status: DC
  Administered 2021-09-28 – 2021-10-01 (×4): 50 mg via SUBCUTANEOUS
  Filled 2021-09-28 (×4): qty 0.6

## 2021-09-28 NOTE — Progress Notes (Signed)
RT attempted to trial pt off of BIPAP and pt immediately became SOB, using accessory muscles and having audible exp wheezes. Rt placed pt back on BIPAP and restarted the 10 mg CAT per CCM. Pt claims that she has some chest discomfort at this time, but seems to be responsive to BIPAP. CCM and RN at bedside to witness. RT will monitor.

## 2021-09-28 NOTE — Progress Notes (Signed)
Chart reviewed.  Patient here with asthma exacerbation, was moved to ICU yesterday after consultation with PCCM and has been requiring BiPAP ever since.  Discussed with PCCM Dr. Gaynell Face, they will continue to see this patient and will assume patient's care as primary attending and hospitalist will sign off.  Please let us know when patient is ready to come off to the floor.

## 2021-09-28 NOTE — Progress Notes (Addendum)
NAME:  Kathleen Rodriguez, MRN:  [id number], DOB:  Feb 20, 1966, LOS: 1 ADMISSION DATE:  09/25/2021, CONSULTATION DATE:  09/27/2021 REFERRING MD:  Jerral Ralph - TRH, CHIEF COMPLAINT:  worsening asthma.   History of Present Illness:  55 year old woman who has been experiencing increasing dyspnea since a tooth extraction one week ago. Her GERD has also worsened.   She has known asthma and OSA followed by Dr Craige Cotta and idiopathic cough.  Hx of prior intubation for respiratory distress in 2016.    Admitted for persistent respiratory symptoms.  Pertinent  Medical History   Past Medical History:  Diagnosis Date   Arthritis    "knees, lower back; legs, ankles" (01/27/2016)   Asthma    followed by Dr. 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   Diabetes mellitus without complication (HCC)    Dyspnea    GERD (gastroesophageal reflux disease)    Hypertension    "doctor took me off RX in 2016" (01/27/2016)   Hypertension    Influenza B April 2014   Complicated by multi-organ failure   OSA on CPAP "since " 03/20/2013   Pneumonia 2016   Required emergent intubation    asthma exacerbation in 2016   Skin ulcer (HCC) secondary to bullous impetigo /trauma 08/29/2018   Sleep apnea    Tobacco abuse    Upper airway cough syndrome      Significant Hospital Events: Including procedures, antibiotic start and stop dates in addition to other pertinent events   12/4 admitted to hospitalist service.   12/5 PCCM consulted for ongoing since  Interim History / Subjective:  Developed worsening respiratory distress around shift change last night prompting ENT eval for recurrent upper airway noises concerning for VCD vs complication r/t dental surgery and transfer to ICU.  No evidence of upper airway obstruction or masses, noted that arytenoids were edematous and erythematous c/w reflux laryngitis.   Trailed  on heliox last night but desaturated into the 70's, therefore stopped.  Started/ remains on low dose precedex for anxiety RSV + parainfluenza  Since, remained on BiPAP 15/8 and 30%, TVs improved from 290 initially to now 450- 500 Ongoing CAT, ~15mg  thus far this morning  Afebrile   Objective   Blood pressure 117/67, pulse 93, temperature 98.1 F (36.7 C), temperature source Axillary, resp. rate 17, height 5\' 5"  (1.651 m), weight 105.1 kg, SpO2 98 %.    FiO2 (%):  [30 %-35 %] 30 %   Intake/Output Summary (Last 24 hours) at 09/28/2021 0815 Last data filed at 09/28/2021 0600 Gross per 24 hour  Intake 114.33 ml  Output --  Net 114.33 ml   Filed Weights   09/26/21 1434 09/28/21 0315  Weight: 103.4 kg 105.1 kg    Examination:  precedex 0.6 General:  Adult female appears relaxed and in no distress on BiPAP sitting upright in bed HEENT: full face mask, no upper airway sounds Neuro:  Sleepy but awakens and f/c's, MAE CV: NSR PULM:  non labored on BiPAP.  TVs ~500, better air movement, diffuse exp prolonged wheeze.  Taken off BiPAP and had immediate audible wheezing/ transmitted sounds and increased WOB with increased abdominal work and neck retractions GI: obese, soft, bs+, purwick   Extremities: warm/dry, no LE edema  Skin: no rashes    Ancillary tests reviewed:   Outpatient PFTs  02/2017 demonstrate moderate airflow obstruction with mild gas trapping and a mild diffusion defect  12/6 > BMET/ CBC unremarkable, RVP +parainfluenza  12/6 > CXR non acute   Assessment & Plan:  Acute respiratory insufficiency with hypoxia Status asthmaticus, acute exacerbation of moderate asthma  Parainfluenza  Class 2 obesity GERD Sleep apnea  Tobacco abuse HTN  Recent dental work (week of 11/28)  - ENT eval 12/5 with no evidence of VCD, upper airway obstruction, or masses.  Arytenoids were edematous and erythematous c/w reflux laryngitis.  - previous intubation 2016 for respiratory  distress Plan:  - continue BiPAP for now, remains high risk for intubation due to recurrent bronchospasm, hopefully avoiding respiratory fatigue.  Getting more TV's and better aeration than last night - NPO except meds - finishing current continue albuterol treatment, continue with brovanna, pulmicort, and yupelri nebs.  Duonebs prn  - continue low dose precedex for anxiety, avoid oversedation, RASS goal 0/-1 - continue solumedrol 40mg  BID - add PPI BID for now - resume home flonase and claritin - continue ASA, clonidine, and prn hydralazine  - lovenox dose adjusted for weight - monitor glucose trends on steroids closely - smoking cessation when appropriate  - ongoing droplet precautions  Best Practice (right click and "Reselect all SmartList Selections" daily)   Diet/type: NPO w/ oral meds DVT prophylaxis: LMWH GI prophylaxis: PPI Lines: N/A Foley:  N/A Code Status:  full code Last date of multidisciplinary goals of care discussion [pta] Husband, updated by phone 12/6.   Labs   CBC: Recent Labs  Lab 09/25/21 2050 09/26/21 0332 09/27/21 2232 09/28/21 0555  WBC 7.9 5.8  --  7.6  NEUTROABS 5.9  --   --  5.2  HGB 14.3 14.3 15.6* 13.4  HCT 44.3 44.4 46.0 41.9  MCV 87.2 88.3  --  88.2  PLT 269 297  --  294     Basic Metabolic Panel: Recent Labs  Lab 09/25/21 2050 09/26/21 0332 09/27/21 2232 09/28/21 0555  NA 140 137 137 135  K 3.9 3.9 4.9 4.1  CL 106 102  --  101  CO2 28 26  --  26  GLUCOSE 134* 258*  --  135*  BUN 17 15  --  19  CREATININE 1.20* 1.17*  --  0.96  CALCIUM 8.2* 8.2*  --  8.3*  MG  --   --   --  2.4  PHOS  --   --   --  5.1*    GFR: Estimated Creatinine Clearance: 78.7 mL/min (by C-G formula based on SCr of 0.96 mg/dL). Recent Labs  Lab 09/25/21 2050 09/26/21 0332 09/28/21 0555  WBC 7.9 5.8 7.6     Liver Function Tests: No results for input(s): AST, ALT, ALKPHOS, BILITOT, PROT, ALBUMIN in the last 168 hours. No results for  input(s): LIPASE, AMYLASE in the last 168 hours. No results for input(s): AMMONIA in the last 168 hours.  ABG    Component Value Date/Time   PHART 7.312 (L) 09/27/2021 2232   PCO2ART 57.6 (H) 09/27/2021 2232   PO2ART 79 (L) 09/27/2021 2232   HCO3 29.4 (H) 09/27/2021 2232   TCO2 31 09/27/2021 2232   ACIDBASEDEF 4.4 (H) 03/31/2021 0458   O2SAT 94.0 09/27/2021 2232      Coagulation Profile: No results for input(s): INR, PROTIME in the last 168 hours.  Cardiac Enzymes: No results for input(s): CKTOTAL, CKMB, CKMBINDEX, TROPONINI in the last 168 hours.  HbA1C: Hgb A1c MFr Bld  Date/Time  Value Ref Range Status  03/31/2021 12:32 PM 5.9 (H) 4.8 - 5.6 % Final    Comment:    (NOTE)         Prediabetes: 5.7 - 6.4         Diabetes: >6.4         Glycemic control for adults with diabetes: <7.0   01/21/2021 03:04 AM 5.8 (H) 4.8 - 5.6 % Final    Comment:    (NOTE) Pre diabetes:          5.7%-6.4%  Diabetes:              >6.4%  Glycemic control for   <7.0% adults with diabetes     CBG: Recent Labs  Lab 09/27/21 1203 09/27/21 1704 09/27/21 1952 09/27/21 2222 09/28/21 0742  GLUCAP 86 110* 135* 148* 120*    CCT: 35 mins   Posey Boyer, ACNP [facility] 09/28/2021, 8:16 AM  See Amion for pager If no response to pager, please call PCCM consult pager After 7:00 pm call Elink

## 2021-09-29 ENCOUNTER — Other Ambulatory Visit: Payer: Self-pay | Admitting: Obstetrics and Gynecology

## 2021-09-29 DIAGNOSIS — J4541 Moderate persistent asthma with (acute) exacerbation: Secondary | ICD-10-CM | POA: Diagnosis not present

## 2021-09-29 LAB — GLUCOSE, CAPILLARY
Glucose-Capillary: 103 mg/dL — ABNORMAL HIGH (ref 70–99)
Glucose-Capillary: 112 mg/dL — ABNORMAL HIGH (ref 70–99)
Glucose-Capillary: 114 mg/dL — ABNORMAL HIGH (ref 70–99)
Glucose-Capillary: 127 mg/dL — ABNORMAL HIGH (ref 70–99)
Glucose-Capillary: 132 mg/dL — ABNORMAL HIGH (ref 70–99)
Glucose-Capillary: 145 mg/dL — ABNORMAL HIGH (ref 70–99)

## 2021-09-29 LAB — BASIC METABOLIC PANEL
Anion gap: 9 (ref 5–15)
BUN: 17 mg/dL (ref 6–20)
CO2: 28 mmol/L (ref 22–32)
Calcium: 8.9 mg/dL (ref 8.9–10.3)
Chloride: 101 mmol/L (ref 98–111)
Creatinine, Ser: 0.89 mg/dL (ref 0.44–1.00)
GFR, Estimated: >60 mL/min (ref >60)
Glucose, Bld: 117 mg/dL — ABNORMAL HIGH (ref 70–99)
Potassium: 4.6 mmol/L (ref 3.5–5.1)
Sodium: 138 mmol/L (ref 135–145)

## 2021-09-29 MED ORDER — NICOTINE 14 MG/24HR TD PT24
14.0000 mg | MEDICATED_PATCH | Freq: Every day | TRANSDERMAL | Status: DC
  Administered 2021-09-29: 14 mg via TRANSDERMAL
  Filled 2021-09-29 (×3): qty 1

## 2021-09-29 MED ORDER — DEXMEDETOMIDINE HCL IN NACL 400 MCG/100ML IV SOLN
0.2000 ug/kg/h | INTRAVENOUS | Status: AC
  Administered 2021-09-29 (×2): 0.8 ug/kg/h via INTRAVENOUS
  Administered 2021-09-29: 0.6 ug/kg/h via INTRAVENOUS
  Administered 2021-09-29: 0.8 ug/kg/h via INTRAVENOUS
  Filled 2021-09-29 (×2): qty 100

## 2021-09-29 NOTE — Plan of Care (Signed)

## 2021-09-29 NOTE — Progress Notes (Signed)
RT removed pt from BiPAP to room air due to pt being anxious. Pt stated she did not want to place BiPAP mask back on. Pt currently on room air and upset crying. RT informed pt that if she has increased WOB RT will have to place her back on BiPAP. MD at bedside at this time.

## 2021-09-29 NOTE — Patient Outreach (Signed)
Care Coordination  09/29/2021  Kathleen Rodriguez 10-15-1966 [id number]  [name] is currently admitted as an inpatient at [facility]. The [facility] Managed Care team will follow the progress of Kathleen Rodriguez and follow up upon discharge.   [name] RN, BSN [place]  [employer], production - Managed Medicaid High Risk [phone].

## 2021-09-29 NOTE — Progress Notes (Signed)
NAME:  Kathleen Rodriguez, MRN:  [id number], DOB:  1966/08/19, LOS: 2 ADMISSION DATE:  09/25/2021, CONSULTATION DATE:  09/27/2021 REFERRING MD:  Jerral Ralph - TRH, CHIEF COMPLAINT:  worsening asthma.   History of Present Illness:  55 year old woman who has been experiencing increasing dyspnea since a tooth extraction one week ago. Her GERD has also worsened.   She has known asthma and OSA followed by Dr Craige Cotta and idiopathic cough.  Hx of prior intubation for respiratory distress in 2016.    Admitted for persistent respiratory symptoms.  Pertinent  Medical History   Past Medical History:  Diagnosis Date   Arthritis    "knees, lower back; legs, ankles" (01/27/2016)   Asthma    followed by Dr. 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   Diabetes mellitus without complication (HCC)    Dyspnea    GERD (gastroesophageal reflux disease)    Hypertension    "doctor took me off RX in 2016" (01/27/2016)   Hypertension    Influenza B April 2014   Complicated by multi-organ failure   OSA on CPAP "since " 03/20/2013   Pneumonia 2016   Required emergent intubation    asthma exacerbation in 2016   Skin ulcer (HCC) secondary to bullous impetigo /trauma 08/29/2018   Sleep apnea    Tobacco abuse    Upper airway cough syndrome    Significant Hospital Events: Including procedures, antibiotic start and stop dates in addition to other pertinent events   12/4 admitted to hospitalist service.   12/5 PCCM consulted asthma, ? VCD, ENT eval, tx to ICU for BiPAP/ precedex 12/6 unable to come off BiPAP, remains on precedex, +parainfluenza 4  Interim History / Subjective:  Remains on precedex 0.8, still intermittently anxious, wanting to call husband and check on son  Able to come off BiPAP this morning doing ok so far.  No complaints, no sputum production, asking for apple juice and possibly  food  Objective   Blood pressure 113/79, pulse 87, temperature (!) 96.7 F (35.9 C), temperature source Axillary, resp. rate (!) 23, height 5\' 5"  (1.651 m), weight 105.3 kg, SpO2 93 %.    FiO2 (%):  [30 %] 30 %   Intake/Output Summary (Last 24 hours) at 09/29/2021 0817 Last data filed at 09/29/2021 0600 Gross per 24 hour  Intake 569.04 ml  Output 350 ml  Net 219.04 ml   Filed Weights   09/26/21 1434 09/28/21 0315 09/29/21 0348  Weight: 103.4 kg 105.1 kg 105.3 kg    Examination:  precedex 0.8 General:  Adult female sitting upright in bed off BIPAP in no distress, intermittently appears anxious HEENT: MM pink/moist, pupils 3/reactive, no upper airway sounds Neuro:  alert, oriented, MAE CV: NSR PULM:  speaking sentences, no retractions or accessory muscle use off NIV, soft diffuse insp wheeze and continued prolonged exp wheeze, improving air movement GI: obese, soft, bs, NT/ ND, [place]  Extremities: warm/dry, no LE edema  Skin: no rashes   UOP 327ml/ 24hr  Wts 105.1 > 105.3kg  BMET reviewed > wnl Remains afebrile   Ancillary tests reviewed:   Outpatient PFTs 02/2017 demonstrate moderate airflow obstruction with mild gas trapping and a mild diffusion defect  Assessment & Plan:  Acute respiratory insufficiency with hypoxia Status asthmaticus, acute exacerbation of moderate asthma  Parainfluenza 4  Class  2 obesity GERD Sleep apnea  Tobacco abuse HTN  Recent dental work (week of 11/28)  Hyperglycemia, steroid induced  - ENT eval 12/5 with no evidence of VCD, upper airway obstruction, or masses.  Arytenoids were edematous and erythematous c/w reflux laryngitis.  - previous intubation 2016 for respiratory distress Plan:  - currently off BiPAP, doing ok but suspect she will need intermittently today.  Goal is to avoid respiratory fatigue.  Better air movement today.  Continue PRN - currently on room air, prn supplemental O2 - can start sips and advance to clear liquids  slowly depending on WOB - continue Aspiration precautions while on BiPAP  - continue precedex for anxiety, RASS goal 0 - mobilize OOB as able, ongoing pulm hygiene - continue with albuterol q 4, brovanna, yupelri, and pulmicort nebs - continue solumedrol 40mg  q 12hrs - prn guaifenesin - continue with flonase, claritin, and PPI   - nicotine patch as needed to prevent withdrawals - continue clonidine w/ prn hydralazine - SSI sensitive for goal BG <180 - continue droplet precautions - no indications for abx   Best Practice (right click and "Reselect all SmartList Selections" daily)   Diet/type: NPO w/ oral meds/ clear liquids/sips DVT prophylaxis: LMWH GI prophylaxis: PPI Lines: N/A Foley:  N/A Code Status:  full code Last date of multidisciplinary goals of care discussion [pta] Husband, updated by phone 12/6.  Patient calling husband, patient updated at bedside.   Labs   CBC: Recent Labs  Lab 09/25/21 2050 09/26/21 0332 09/27/21 2232 09/28/21 0555  WBC 7.9 5.8  --  7.6  NEUTROABS 5.9  --   --  5.2  HGB 14.3 14.3 15.6* 13.4  HCT 44.3 44.4 46.0 41.9  MCV 87.2 88.3  --  88.2  PLT 269 297  --  294    Basic Metabolic Panel: Recent Labs  Lab 09/25/21 2050 09/26/21 0332 09/27/21 2232 09/28/21 0555 09/29/21 0214  NA 140 137 137 135 138  K 3.9 3.9 4.9 4.1 4.6  CL 106 102  --  101 101  CO2 28 26  --  26 28  GLUCOSE 134* 258*  --  135* 117*  BUN 17 15  --  19 17  CREATININE 1.20* 1.17*  --  0.96 0.89  CALCIUM 8.2* 8.2*  --  8.3* 8.9  MG  --   --   --  2.4  --   PHOS  --   --   --  5.1*  --    GFR: Estimated Creatinine Clearance: 85 mL/min (by C-G formula based on SCr of 0.89 mg/dL). Recent Labs  Lab 09/25/21 2050 09/26/21 0332 09/28/21 0555  WBC 7.9 5.8 7.6    Liver Function Tests: No results for input(s): AST, ALT, ALKPHOS, BILITOT, PROT, ALBUMIN in the last 168 hours. No results for input(s): LIPASE, AMYLASE in the last 168 hours. No results for  input(s): AMMONIA in the last 168 hours.  ABG    Component Value Date/Time   PHART 7.312 (L) 09/27/2021 2232   PCO2ART 57.6 (H) 09/27/2021 2232   PO2ART 79 (L) 09/27/2021 2232   HCO3 29.4 (H) 09/27/2021 2232   TCO2 31 09/27/2021 2232   ACIDBASEDEF 4.4 (H) 03/31/2021 0458   O2SAT 94.0 09/27/2021 2232     Coagulation Profile: No results for input(s): INR, PROTIME in the last 168 hours.  Cardiac Enzymes: No results for input(s): CKTOTAL, CKMB, CKMBINDEX, TROPONINI in the last 168 hours.  HbA1C: Hgb A1c MFr Bld  Date/Time  Value Ref Range Status  03/31/2021 12:32 PM 5.9 (H) 4.8 - 5.6 % Final    Comment:    (NOTE)         Prediabetes: 5.7 - 6.4         Diabetes: >6.4         Glycemic control for adults with diabetes: <7.0   01/21/2021 03:04 AM 5.8 (H) 4.8 - 5.6 % Final    Comment:    (NOTE) Pre diabetes:          5.7%-6.4%  Diabetes:              >6.4%  Glycemic control for   <7.0% adults with diabetes     CBG: Recent Labs  Lab 09/28/21 1727 09/28/21 1950 09/28/21 2341 09/29/21 0342 09/29/21 0800  GLUCAP 169* 139* 136* 132* 103*   CCT: 30 mins   Posey Boyer, ACNP [facility] 09/29/2021, 8:17 AM  See Amion for pager If no response to pager, please call PCCM consult pager After 7:00 pm call Elink

## 2021-09-30 DIAGNOSIS — J4541 Moderate persistent asthma with (acute) exacerbation: Secondary | ICD-10-CM | POA: Diagnosis not present

## 2021-09-30 LAB — GLUCOSE, CAPILLARY
Glucose-Capillary: 156 mg/dL — ABNORMAL HIGH (ref 70–99)
Glucose-Capillary: 94 mg/dL (ref 70–99)
Glucose-Capillary: 99 mg/dL (ref 70–99)
Glucose-Capillary: 103 mg/dL — ABNORMAL HIGH (ref 70–99)
Glucose-Capillary: 91 mg/dL (ref 70–99)

## 2021-09-30 LAB — BASIC METABOLIC PANEL
Anion gap: 7 (ref 5–15)
BUN: 19 mg/dL (ref 6–20)
CO2: 25 mmol/L (ref 22–32)
Calcium: 8.7 mg/dL — ABNORMAL LOW (ref 8.9–10.3)
Chloride: 101 mmol/L (ref 98–111)
Creatinine, Ser: 0.8 mg/dL (ref 0.44–1.00)
GFR, Estimated: >60 mL/min (ref >60)
Glucose, Bld: 130 mg/dL — ABNORMAL HIGH (ref 70–99)
Potassium: 4.9 mmol/L (ref 3.5–5.1)
Sodium: 133 mmol/L — ABNORMAL LOW (ref 135–145)

## 2021-09-30 MED ORDER — INSULIN ASPART 100 UNIT/ML IJ SOLN
0.0000 [IU] | Freq: Three times a day (TID) | INTRAMUSCULAR | Status: DC
  Administered 2021-10-01: 1 [IU] via SUBCUTANEOUS

## 2021-09-30 MED ORDER — DOCUSATE SODIUM 100 MG PO CAPS
100.0000 mg | ORAL_CAPSULE | Freq: Two times a day (BID) | ORAL | Status: DC
  Administered 2021-09-30 – 2021-10-01 (×3): 100 mg via ORAL
  Filled 2021-09-30 (×3): qty 1

## 2021-09-30 MED ORDER — GUAIFENESIN ER 600 MG PO TB12
1200.0000 mg | ORAL_TABLET | Freq: Two times a day (BID) | ORAL | Status: DC | PRN
  Administered 2021-09-30 (×2): 1200 mg via ORAL
  Filled 2021-09-30 (×2): qty 2

## 2021-09-30 MED ORDER — PANTOPRAZOLE SODIUM 40 MG PO TBEC
40.0000 mg | DELAYED_RELEASE_TABLET | Freq: Every day | ORAL | Status: DC
  Administered 2021-09-30 – 2021-10-01 (×2): 40 mg via ORAL
  Filled 2021-09-30 (×2): qty 1

## 2021-09-30 MED ORDER — INSULIN ASPART 100 UNIT/ML IJ SOLN: 0.0000 [IU] | Freq: Every day | INTRAMUSCULAR | Status: DC

## 2021-09-30 MED ORDER — FLUCONAZOLE 150 MG PO TABS
150.0000 mg | ORAL_TABLET | Freq: Once | ORAL | Status: DC
  Filled 2021-09-30: qty 1

## 2021-09-30 MED ORDER — PREDNISONE 20 MG PO TABS
40.0000 mg | ORAL_TABLET | Freq: Every day | ORAL | Status: DC
  Administered 2021-10-01: 40 mg via ORAL
  Filled 2021-09-30: qty 2

## 2021-09-30 NOTE — Progress Notes (Signed)
RT gave and instructed patient on how to use a flutter valve.  Patient showed understanding by demonstrating how to use the device. RT will continue to monitor.

## 2021-09-30 NOTE — TOC Initial Note (Signed)
Transition of Care [facility]) - Initial/Assessment Note    Patient Details  Name: Kathleen Rodriguez MRN: [id number] Date of Birth: 1966-09-29  [facility] CM/SW Contact:    Tom-Johnson, Hershal Coria, RN Phone Number: 09/30/2021, 3:27 PM  Clinical Narrative:                 CM consulted for medication assistance. Spoke with patient at bedside. Patient is admitted for Asthma exacerbation. Patient states she is from home with husband and 69 yr old son. Not currently employed, on supplemental disability. Does not drive, uses [employer] transportation and sometimes friends assists. Has a cane, rollator, tub bench at home. PCP is Hoy Register, MD [facility] [address]. Currently on 2L oxygen but does not have home oxygen. Patient states she recently lost her home and does not have money to co pay for her medication. Patient has Medicaid [facility]. TOC supervisor notified, CM will discuss co pay at discharge. TOC pharmacy made aware as well. Patient also requested transportation at discharge. CM ensures her that transportation will be provided at discharge. CM will continue to follow with needs.     Barriers to Discharge: Continued Medical Work up   Patient Goals and CMS Choice Patient states their goals for this hospitalization and ongoing recovery are:: To go home CMS Medicare.gov Compare Post Acute Care list provided to:: Patient    Expected Discharge Plan and Services         Living arrangements for the past 2 months: Apartment                                      Prior Living Arrangements/Services Living arrangements for the past 2 months: Apartment Lives with:: Spouse, Minor Children Patient language and need for interpreter reviewed:: Yes Do you feel safe going back to the place where you live?: Yes      Need for Family Participation in Patient Care: Yes (Comment) Care giver support system in place?: Yes (comment)   Criminal  Activity/Legal Involvement Pertinent to Current Situation/Hospitalization: No - Comment as needed  Activities of Daily Living Home Assistive Devices/Equipment: CPAP, [place] (specify type), Shower chair with back ADL Screening (condition at time of admission) Patient's cognitive ability adequate to safely complete daily activities?: Yes Is the patient deaf or have difficulty hearing?: No Does the patient have difficulty seeing, even when wearing glasses/contacts?: No Does the patient have difficulty concentrating, remembering, or making decisions?: No Patient able to express need for assistance with ADLs?: Yes Does the patient have difficulty dressing or bathing?: No Independently performs ADLs?: Yes (appropriate for developmental age) Does the patient have difficulty walking or climbing stairs?: No Weakness of Legs: None Weakness of Arms/Hands: None  Permission Sought/Granted Permission sought to share information with : Case Manager, Family Supports Permission granted to share information with : Yes, Verbal Permission Granted              Emotional Assessment Appearance:: Appears stated age Attitude/Demeanor/Rapport: Engaged Affect (typically observed): Accepting, Appropriate, Calm, Anxious, Hopeful Orientation: : Oriented to Self, Oriented to Place, Oriented to  Time, Oriented to Situation Alcohol / Substance Use: Not Applicable Psych Involvement: No (comment)  Admission diagnosis:  Moderate persistent asthma with exacerbation [J45.41] Asthma exacerbation [J45.901] Acute asthma exacerbation [J45.901] Patient Active Problem List   Diagnosis Date Noted   Asthma exacerbation 09/25/2021   Diabetes mellitus without  complication (HCC) 01/20/2021   OSA (obstructive sleep apnea) 01/20/2021   Essential hypertension 01/20/2021   Obesity (BMI 30-39.9) 01/20/2021   Acute asthma exacerbation 10/09/2019   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   Asthma 10/22/2017   Osteoarthritis of left  knee 09/01/2016   Total knee replacement status 09/01/2016   Osteoarthritis of knees, 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   Primary osteoarthritis of left knee 03/09/2016   Anemia, iron deficiency 08/12/2015   Dysfunctional uterine bleeding 12/28/2014   Morbid obesity (HCC) 05/16/2014   Upper airway cough syndrome 05/01/2014   GERD (gastroesophageal reflux disease) 04/08/2014   OSA (obstructive sleep apnea) 03/20/2013   PCP:  Hoy Register, MD Pharmacy:   [id number] [place] [place] - [address] AT [facility] [address] [place] [address] Phone: [phone] Fax: [phone]  [id number] - [address] ([place]), [place] - [address] [place] ([place]) [place] [address] Phone: [phone] Fax: [phone]  [facility] [address] [place] [address] Phone: [phone] Fax: [phone]     Social Determinants of Health (SDOH) Interventions    Readmission Risk Interventions No flowsheet data found.

## 2021-09-30 NOTE — Progress Notes (Addendum)
NAME:  Kathleen Rodriguez, MRN:  [id number], DOB:  [birth year], LOS: 3 ADMISSION DATE:  09/25/2021, CONSULTATION DATE:  09/27/2021 REFERRING MD:  Jerral Ralph - TRH, CHIEF COMPLAINT:  worsening asthma.   History of Present Illness:  55 year old woman who has been experiencing increasing dyspnea since a tooth extraction one week ago. Her GERD has also worsened.   She has known asthma and OSA followed by Dr Craige Cotta and idiopathic cough.  Hx of prior intubation for respiratory distress in 2016.    Admitted for persistent respiratory symptoms.  Pertinent  Medical History   Past Medical History:  Diagnosis Date   Arthritis    "knees, lower back; legs, ankles" (01/27/2016)   Asthma    followed by Dr. 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   Diabetes mellitus without complication (HCC)    Dyspnea    GERD (gastroesophageal reflux disease)    Hypertension    "doctor took me off RX in 2016" (01/27/2016)   Hypertension    Influenza B April 2014   Complicated by multi-organ failure   OSA on CPAP "since " 03/20/2013   Pneumonia 2016   Required emergent intubation    asthma exacerbation in 2016   Skin ulcer (HCC) secondary to bullous impetigo /trauma 08/29/2018   Sleep apnea    Tobacco abuse    Upper airway cough syndrome    Significant Hospital Events: Including procedures, antibiotic start and stop dates in addition to other pertinent events   12/4 admitted to hospitalist service.   12/5 PCCM consulted asthma, ? VCD, ENT eval, tx to ICU for BiPAP/ precedex 12/6 unable to come off BiPAP, remains on precedex, +parainfluenza 4 12/7: off bipap during the day; wore bipap at bedtime; off precedex 12/8: stable and transferring to floor  Interim History / Subjective:  Off precedex Did well yesterday off bipap during the day and wore bipap during the night. Eating well Constipated bowel  movement yesterday  Objective   Blood pressure 119/72, pulse 83, temperature 97.7 F (36.5 C), temperature source Oral, resp. rate (!) 25, height 5\' 5"  (1.651 m), weight 105.4 kg, SpO2 91 %.    FiO2 (%):  [30 %] 30 %   Intake/Output Summary (Last 24 hours) at 09/30/2021 0721 Last data filed at 09/30/2021 0400 Gross per 24 hour  Intake 1212.5 ml  Output 950 ml  Net 262.5 ml    Filed Weights   09/28/21 0315 09/29/21 0348 09/30/21 0449  Weight: 105.1 kg 105.3 kg 105.4 kg    Examination:  General:   NAD HEENT: MM pink/moist; [place] in place Neuro: Aox3; MAE CV: s1s2, RRR, no m/r/g PULM:  dim clear BS bilaterally; [place] 3L GI: soft, bsx4 active  Extremities: warm/dry, no edema  Skin: no rashes or lesions   Uop 950 ml last 24 hours   Ancillary tests reviewed:   Outpatient PFTs 02/2017 demonstrate moderate airflow obstruction with mild gas trapping and a mild diffusion defect  Assessment & Plan:  Acute respiratory insufficiency with hypoxia Status asthmaticus, acute exacerbation of moderate asthma  Parainfluenza 4  Class 2 obesity GERD Sleep apnea  Tobacco abuse HTN  Recent dental work (week of 11/28)  Hyperglycemia, steroid induced  - ENT eval 12/5 with no evidence of VCD, upper airway obstruction, or masses.  Arytenoids were edematous and erythematous c/w reflux  laryngitis.  - previous intubation 2016 for respiratory distress Plan: -[place] during the day; wean for sats >92% -BiPAP qhs and prn -pulm toiletry: IS and flutter -PT/OT; OOB as tolerated -continue q4 albuterol nebs, yupelri, brovana, pulmicort -continue solumedrol 40 mg q12; consider weaning to prednisone -prn guaifenesin -continue flonase, claritin -continue PPI -continue clonidine w/ prn hydralazine -continue SSI and cbg monitoring -continue droplet precautions   Patient stable and will send out to floors today. PCCM will sign off and available prn   Best Practice (right click and "Reselect all SmartList  Selections" daily)   Diet/type: Regular consistency (see orders)/ clear liquids/sips DVT prophylaxis: LMWH GI prophylaxis: PPI Lines: N/A Foley:  N/A Code Status:  full code Last date of multidisciplinary goals of care discussion [pta] 12/8 updated patient at bedside  Labs   CBC: Recent Labs  Lab 09/25/21 2050 09/26/21 0332 09/27/21 2232 09/28/21 0555  WBC 7.9 5.8  --  7.6  NEUTROABS 5.9  --   --  5.2  HGB 14.3 14.3 15.6* 13.4  HCT 44.3 44.4 46.0 41.9  MCV 87.2 88.3  --  88.2  PLT 269 297  --  294     Basic Metabolic Panel: Recent Labs  Lab 09/25/21 2050 09/26/21 0332 09/27/21 2232 09/28/21 0555 09/29/21 0214 09/30/21 0433  NA 140 137 137 135 138 133*  K 3.9 3.9 4.9 4.1 4.6 4.9  CL 106 102  --  101 101 101  CO2 28 26  --  26 28 25   GLUCOSE 134* 258*  --  135* 117* 130*  BUN 17 15  --  19 17 19   CREATININE 1.20* 1.17*  --  0.96 0.89 0.80  CALCIUM 8.2* 8.2*  --  8.3* 8.9 8.7*  MG  --   --   --  2.4  --   --   PHOS  --   --   --  5.1*  --   --     GFR: Estimated Creatinine Clearance: 94.7 mL/min (by C-G formula based on SCr of 0.8 mg/dL). Recent Labs  Lab 09/25/21 2050 09/26/21 0332 09/28/21 0555  WBC 7.9 5.8 7.6     Liver Function Tests: No results for input(s): AST, ALT, ALKPHOS, BILITOT, PROT, ALBUMIN in the last 168 hours. No results for input(s): LIPASE, AMYLASE in the last 168 hours. No results for input(s): AMMONIA in the last 168 hours.  ABG    Component Value Date/Time   PHART 7.312 (L) 09/27/2021 2232   PCO2ART 57.6 (H) 09/27/2021 2232   PO2ART 79 (L) 09/27/2021 2232   HCO3 29.4 (H) 09/27/2021 2232   TCO2 31 09/27/2021 2232   ACIDBASEDEF 4.4 (H) 03/31/2021 0458   O2SAT 94.0 09/27/2021 2232      Coagulation Profile: No results for input(s): INR, PROTIME in the last 168 hours.  Cardiac Enzymes: No results for input(s): CKTOTAL, CKMB, CKMBINDEX, TROPONINI in the last 168 hours.  HbA1C: Hgb A1c MFr Bld  Date/Time Value Ref Range  Status  03/31/2021 12:32 PM 5.9 (H) 4.8 - 5.6 % Final    Comment:    (NOTE)         Prediabetes: 5.7 - 6.4         Diabetes: >6.4         Glycemic control for adults with diabetes: <7.0   01/21/2021 03:04 AM 5.8 (H) 4.8 - 5.6 % Final    Comment:    (NOTE) Pre diabetes:  5.7%-6.4%  Diabetes:              >6.4%  Glycemic control for   <7.0% adults with diabetes     CBG: Recent Labs  Lab 09/29/21 1219 09/29/21 1551 09/29/21 1943 09/29/21 2344 09/30/21 0335  GLUCAP 112* 127* 145* 114* 156*      JD Anselm Lis [facility] 09/30/2021, 7:58 AM  Please see [email or web address] for pager details.  From 7A-7P if no response, please call [phone]. After hours, please call ELink [phone].

## 2021-09-30 NOTE — Plan of Care (Signed)

## 2021-10-01 ENCOUNTER — Inpatient Hospital Stay (HOSPITAL_COMMUNITY): Payer: Medicaid Other

## 2021-10-01 ENCOUNTER — Other Ambulatory Visit (HOSPITAL_COMMUNITY): Payer: Self-pay

## 2021-10-01 DIAGNOSIS — J4551 Severe persistent asthma with (acute) exacerbation: Secondary | ICD-10-CM

## 2021-10-01 LAB — BASIC METABOLIC PANEL
Anion gap: 8 (ref 5–15)
BUN: 18 mg/dL (ref 6–20)
CO2: 30 mmol/L (ref 22–32)
Calcium: 8.3 mg/dL — ABNORMAL LOW (ref 8.9–10.3)
Chloride: 98 mmol/L (ref 98–111)
Creatinine, Ser: 0.89 mg/dL (ref 0.44–1.00)
GFR, Estimated: >60 mL/min (ref >60)
Glucose, Bld: 119 mg/dL — ABNORMAL HIGH (ref 70–99)
Potassium: 4.6 mmol/L (ref 3.5–5.1)
Sodium: 136 mmol/L (ref 135–145)

## 2021-10-01 LAB — CBC
HCT: 40.9 % (ref 36.0–46.0)
Hemoglobin: 13.3 g/dL (ref 12.0–15.0)
MCH: 28.1 pg (ref 26.0–34.0)
MCHC: 32.5 g/dL (ref 30.0–36.0)
MCV: 86.5 fL (ref 80.0–100.0)
Platelets: 314 10*3/uL (ref 150–400)
RBC: 4.73 MIL/uL (ref 3.87–5.11)
RDW: 16 % — ABNORMAL HIGH (ref 11.5–15.5)
WBC: 7.4 10*3/uL (ref 4.0–10.5)
nRBC: 0 % (ref 0.0–0.2)

## 2021-10-01 LAB — GLUCOSE, CAPILLARY
Glucose-Capillary: 124 mg/dL — ABNORMAL HIGH (ref 70–99)
Glucose-Capillary: 92 mg/dL (ref 70–99)

## 2021-10-01 LAB — MAGNESIUM: Magnesium: 2.1 mg/dL (ref 1.7–2.4)

## 2021-10-01 MED ORDER — BUDESONIDE-FORMOTEROL FUMARATE 160-4.5 MCG/ACT IN AERO
2.0000 | INHALATION_SPRAY | Freq: Two times a day (BID) | RESPIRATORY_TRACT | 12 refills | Status: DC
  Filled 2021-10-01: qty 10.2, 30d supply, fill #0

## 2021-10-01 MED ORDER — GUAIFENESIN ER 600 MG PO TB12
1200.0000 mg | ORAL_TABLET | Freq: Two times a day (BID) | ORAL | 0 refills | Status: DC | PRN
Start: 2021-10-01 — End: 2021-10-19
  Filled 2021-10-01: qty 16, 4d supply, fill #0

## 2021-10-01 MED ORDER — LORATADINE 10 MG PO TABS
10.0000 mg | ORAL_TABLET | Freq: Every day | ORAL | 0 refills | Status: DC
  Filled 2021-10-01: qty 30, 30d supply, fill #0

## 2021-10-01 MED ORDER — MONTELUKAST SODIUM 10 MG PO TABS: 10.0000 mg | ORAL_TABLET | Freq: Every day | ORAL | Status: DC

## 2021-10-01 MED ORDER — PREDNISONE 20 MG PO TABS
40.0000 mg | ORAL_TABLET | Freq: Every day | ORAL | 0 refills | Status: DC
  Filled 2021-10-01: qty 20, 10d supply, fill #0

## 2021-10-01 MED ORDER — PANTOPRAZOLE SODIUM 40 MG PO TBEC
40.0000 mg | DELAYED_RELEASE_TABLET | Freq: Every day | ORAL | 0 refills | Status: DC
  Filled 2021-10-01: qty 30, 30d supply, fill #0

## 2021-10-01 MED ORDER — IPRATROPIUM-ALBUTEROL 0.5-2.5 (3) MG/3ML IN SOLN
3.0000 mL | Freq: Three times a day (TID) | RESPIRATORY_TRACT | 1 refills | Status: DC
  Filled 2021-10-01: qty 180, 20d supply, fill #0

## 2021-10-01 MED ORDER — NICOTINE 14 MG/24HR TD PT24
14.0000 mg | MEDICATED_PATCH | Freq: Every day | TRANSDERMAL | 0 refills | Status: DC
Start: 2021-10-02 — End: 2021-10-18
  Filled 2021-10-01: qty 28, 28d supply, fill #0

## 2021-10-01 MED ORDER — ALBUTEROL SULFATE (2.5 MG/3ML) 0.083% IN NEBU: 2.5000 mg | INHALATION_SOLUTION | Freq: Three times a day (TID) | RESPIRATORY_TRACT | Status: DC

## 2021-10-01 MED ORDER — MONTELUKAST SODIUM 10 MG PO TABS
10.0000 mg | ORAL_TABLET | Freq: Every day | ORAL | 0 refills | Status: DC
  Filled 2021-10-01: qty 30, 30d supply, fill #0

## 2021-10-01 MED ORDER — IPRATROPIUM-ALBUTEROL 0.5-2.5 (3) MG/3ML IN SOLN: 3.0000 mL | Freq: Three times a day (TID) | RESPIRATORY_TRACT | Status: DC

## 2021-10-01 MED ORDER — FLUCONAZOLE 150 MG PO TABS
150.0000 mg | ORAL_TABLET | Freq: Once | ORAL | 0 refills | Status: AC
Start: 2021-10-01 — End: 2021-10-02
  Filled 2021-10-01: qty 1, 1d supply, fill #0

## 2021-10-01 NOTE — Discharge Summary (Signed)
Physician Discharge Summary         Patient ID: Kathleen Rodriguez MRN: [id number] DOB/AGE: 05/30/1966 55 y.o.  Admit date: 09/25/2021 Discharge date: 10/01/2021  Discharge Diagnoses:   Acute respiratory insufficiency with hypoxia Status asthmaticus, acute exacerbation of moderate asthma  Parainfluenza 4  Class 2 obesity Sleep apnea  Tobacco abuse DMT2 GERD HTN   Discharge summary   55 year old woman with pertinent PMH of asthma, COPD (patient of Dr. Halford Chessman with [facility]), OSA, DMT2, HTN, GERD, obesity, CHF presents to [facility] on 12/3 with severe SOB. She states she has been experiencing increasing dyspnea since a tooth extraction one week prior to admission. She has not ben using CPAP at home due to discomfort. Her GERD has also worsened. Hx of prior intubation for respiratory distress in 2016.   12/3 admitted to [facility]. Patient appeared to be in acute asthma exacerbation. Started on continuous BiPAP. Started on nebulizers, magnesium, and IV Solumedrol. CBC normal. CXR no abnormality. Positive for parainfluienza 4. Patient respiratory status continued to worsen and admitted to ICU on 12/5. Racemic epi given for upper airway wheezing. ENT consulted for concern for upper airway and possible vocal cord dysfunction. No evidence of upper airway obstruction appreciated. Patient having severe anxiety requiring precedex drip. Next few days patient respiratory status continued to improve. Patient is off bipap and wearing cpap at bedtime. Still requiring nebulizer treatments for wheezing. IV steroids switched to oral on 12/8. Today on 12/9 patient breath sounds are much imrpved. Weaning albuterol treatments. Patient is off oxygen and on room air. Patient walked down the hall on room air and sats maintained 91% and greater. Patient does have financial issues with home medications. TOC nurse on board and will help facilitate getting appropriate medications for discharge. Patient is stable for  discharge.    Discharge Plan by Active Problems     Acute respiratory insufficiency with hypoxia Status asthmaticus, acute exacerbation of moderate asthma  Parainfluenza 4  Class 2 obesity Sleep apnea  Tobacco abuse P: -on room air -cpap at bedtime and prn  -prednisone 40 mg daily; follow up with outpatient pulmonary before tapering -duoneb tid; symbicort bid -albuterol inhaler prn for wheezing/shortness of breath -prn mucinex for congestion -singulair daily -continue flonase and claritin -continue flutter valve as needed for congestion -will need to follow up with outpatient [facility] -if respiratory symptoms worsen please return to ED  DMT2 P: -home metformin ordered -continue to monitor blood sugars -follow up with outpatient PCP  HTN P: -reordered home clonidine -check BP daily -follow up with outpatient PCP  GERD P: -continue Protonix -follow up with outpatient PCP  Significant Hospital tests/ studies   Procedures    Culture data/antimicrobials   Positve for parainfluenza 4   Consults  ENT    Discharge Exam: BP (!) 143/82   Pulse 91   Temp 98.5 F (36.9 C) (Oral)   Resp 16   Ht 5' 5" (1.651 m)   Wt 105.4 kg   SpO2 91%   BMI 38.67 kg/m   General:   NAD; up in chair HEENT: MM pink/moist Neuro: Aox3; MAE CV: s1s2, RRR, no m/r/g PULM:  dim clear BS bilaterally; on room air  GI: soft, bsx4 active  Extremities: warm/dry, no edema  Skin: no rashes or lesions   Labs at discharge   Lab Results  Component Value Date   CREATININE 0.89 10/01/2021   BUN 18 10/01/2021   NA 136 10/01/2021   K 4.6 10/01/2021  CL 98 10/01/2021   CO2 30 10/01/2021   Lab Results  Component Value Date   WBC 7.4 10/01/2021   HGB 13.3 10/01/2021   HCT 40.9 10/01/2021   MCV 86.5 10/01/2021   PLT 314 10/01/2021   Lab Results  Component Value Date   ALT 15 04/01/2021   AST 14 (L) 04/01/2021   ALKPHOS 51 04/01/2021   BILITOT 0.3 04/01/2021   Lab  Results  Component Value Date   INR 1.02 08/25/2016   INR 1.11 11/21/2015   INR 1.14 02/09/2013    Current radiological studies    DG Chest Port 1 View  Result Date: 10/01/2021 CLINICAL DATA:  Shortness of breath.  History of asthma. EXAM: PORTABLE CHEST 1 VIEW COMPARISON:  Chest XR, 09/28/2021 and 09/27/2021. CT chest, 03/12/2018 and 02/05/2013. FINDINGS: Cardiomediastinal silhouette is within normal limits. Lungs are well inflated. LEFT nipple shadow. No focal consolidation or mass. No pleural effusion or pneumothorax. Chronic RIGHT distal clavicular deformity. No acute displaced fracture. IMPRESSION: No acute cardiopulmonary process. Electronically Signed   By: Michaelle Birks M.D.   On: 10/01/2021 08:58    Disposition:  Home      Allergies as of 10/01/2021       Reactions   Tomato Hives, Itching, Other (See Comments)   ALSO REACTS TO KETCHUP   Latex Itching, Rash   Wool Alcohol [lanolin] Itching        Medication List     STOP taking these medications    acetaminophen 500 MG tablet Commonly known as: TYLENOL   Advair HFA 230-21 MCG/ACT inhaler Generic drug: fluticasone-salmeterol   budesonide 0.25 MG/2ML nebulizer solution Commonly known as: PULMICORT   EPINEPHrine 0.3 mg/0.3 mL Soaj injection Commonly known as: EPI-PEN   gabapentin 300 MG capsule Commonly known as: NEURONTIN   methocarbamol 500 MG tablet Commonly known as: ROBAXIN   multivitamin with minerals Tabs tablet   oxyCODONE-acetaminophen 5-325 MG tablet Commonly known as: PERCOCET/ROXICET       TAKE these medications    Accu-Chek Guide Me w/Device Kit Use to check blood sugar once daily.   Accu-Chek Guide test strip Generic drug: glucose blood Use as instructed daily before breakfast.   Accu-Chek Softclix Lancets lancets Use to check blood sugar once daily.   albuterol 108 (90 Base) MCG/ACT inhaler Commonly known as: ProAir HFA TAKE 2 PUFFS BY MOUTH EVERY 6 HOURS AS NEEDED FOR  WHEEZE OR SHORTNESS OF BREATH What changed: Another medication with the same name was removed. Continue taking this medication, and follow the directions you see here.   aspirin 81 MG EC tablet Take 1 tablet (81 mg total) by mouth daily.   budesonide-formoterol 160-4.5 MCG/ACT inhaler Commonly known as: Symbicort Inhale 2 puffs into the lungs 2 (two) times daily.   cloNIDine 0.2 MG tablet Commonly known as: CATAPRES Take 0.2 mg by mouth 2 (two) times daily. For BP and hot flashes   fluconazole 150 MG tablet Commonly known as: DIFLUCAN Take 1 tablet (150 mg total) by mouth once for 1 dose.   fluticasone 50 MCG/ACT nasal spray Commonly known as: FLONASE Place 1 spray into both nostrils daily as needed for allergies.   guaiFENesin 600 MG 12 hr tablet Commonly known as: MUCINEX Take 2 tablets (1,200 mg total) by mouth 2 (two) times daily as needed for to loosen phlegm or cough.   ipratropium-albuterol 0.5-2.5 (3) MG/3ML Soln Commonly known as: DUONEB Take 3 mLs by nebulization 3 (three) times daily.   loratadine 10  MG tablet Commonly known as: CLARITIN Take 1 tablet (10 mg total) by mouth daily. Start taking on: October 02, 2021   metFORMIN 500 MG tablet Commonly known as: GLUCOPHAGE TAKE 1 TABLET BY MOUTH 2 TIMES DAILY WITH A MEAL.   Misc. Devices Misc Blood pressure monitor  Dx: Hypertension What changed: Another medication with the same name was removed. Continue taking this medication, and follow the directions you see here.   montelukast 10 MG tablet Commonly known as: SINGULAIR Take 1 tablet (10 mg total) by mouth at bedtime.   nicotine 14 mg/24hr patch Commonly known as: NICODERM CQ - dosed in mg/24 hours Place 1 patch (14 mg total) onto the skin daily. Start taking on: October 02, 2021   pantoprazole 40 MG tablet Commonly known as: PROTONIX Take 1 tablet (40 mg total) by mouth daily at 12 noon.   predniSONE 20 MG tablet Commonly known as:  DELTASONE Take 2 tablets (40 mg total) by mouth daily with breakfast. Start taking on: October 02, 2021 What changed:  medication strength See the new instructions.         Follow-up appointment   Follow up with [facility] Monday 12/12 Follow up with PCP within 1 week Discharge Condition:    stable  Physician Statement:   The Patient was personally examined, the discharge assessment and plan has been personally reviewed and I agree with ACNP Babcock's assessment and plan. 40 minutes of time have been dedicated to discharge assessment, planning and discharge instructions.   Signed: Mick Sell 10/01/2021, 11:00 AM

## 2021-10-01 NOTE — Progress Notes (Incomplete)
NAME:  Kathleen Rodriguez, MRN:  [id number], DOB:  18-Mar-1966, LOS: 4 ADMISSION DATE:  09/25/2021, CONSULTATION DATE:  09/27/2021 REFERRING MD:  Jerral Ralph - TRH, CHIEF COMPLAINT:  worsening asthma.   History of Present Illness:  55 year old woman who has been experiencing increasing dyspnea since a tooth extraction one week ago. Her GERD has also worsened.   She has known asthma and OSA followed by Dr Craige Cotta and idiopathic cough.  Hx of prior intubation for respiratory distress in 2016.    Admitted for persistent respiratory symptoms.  Pertinent  Medical History   Past Medical History:  Diagnosis Date   Arthritis    "knees, lower back; legs, ankles" (01/27/2016)   Asthma    followed by Dr. 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   Diabetes mellitus without complication (HCC)    Dyspnea    GERD (gastroesophageal reflux disease)    Hypertension    "doctor took me off RX in 2016" (01/27/2016)   Hypertension    Influenza B April 2014   Complicated by multi-organ failure   OSA on CPAP "since " 03/20/2013   Pneumonia 2016   Required emergent intubation    asthma exacerbation in 2016   Skin ulcer (HCC) secondary to bullous impetigo /trauma 08/29/2018   Sleep apnea    Tobacco abuse    Upper airway cough syndrome    Significant Hospital Events: Including procedures, antibiotic start and stop dates in addition to other pertinent events   12/4 admitted to hospitalist service.   12/5 PCCM consulted asthma, ? VCD, ENT eval, tx to ICU for BiPAP/ precedex 12/6 unable to come off BiPAP, remains on precedex, +parainfluenza 4 12/7: off bipap during the day; wore bipap at bedtime; off precedex 12/8: stable and transferring to floor  Interim History / Subjective:  ***  Off precedex Did well yesterday off bipap during the day and wore bipap during the night. Eating well Constipated bowel  movement yesterday  Objective   Blood pressure (!) 123/96, pulse 74, temperature (!) 96.9 F (36.1 C), temperature source Oral, resp. rate 16, height 5\' 5"  (1.651 m), weight 105.4 kg, SpO2 97 %.    FiO2 (%):  [30 %] 30 %   Intake/Output Summary (Last 24 hours) at 10/01/2021 0742 Last data filed at 09/30/2021 0900 Gross per 24 hour  Intake 240 ml  Output 850 ml  Net -610 ml    Filed Weights   09/28/21 0315 09/29/21 0348 09/30/21 0449  Weight: 105.1 kg 105.3 kg 105.4 kg    Examination:  General:   NAD HEENT: MM pink/moist; [place] in place Neuro: Aox3; MAE CV: s1s2, RRR, no m/r/g PULM:  dim clear BS bilaterally; [place] 3L GI: soft, bsx4 active  Extremities: warm/dry, no edema  Skin: no rashes or lesions   Uop 950 ml last 24 hours   Ancillary tests reviewed:   Outpatient PFTs 02/2017 demonstrate moderate airflow obstruction with mild gas trapping and a mild diffusion defect  Assessment & Plan:  Acute respiratory insufficiency with hypoxia Status asthmaticus, acute exacerbation of moderate asthma  Parainfluenza 4  Class 2 obesity GERD Sleep apnea  Tobacco abuse - ENT eval 12/5 with no evidence of VCD, upper airway obstruction, or masses.  Arytenoids were edematous and erythematous c/w reflux laryngitis.  - previous intubation 2016 for respiratory distress P: -  [place] during the day; wean for sats >92% -BiPAP qhs and prn -pulm toiletry: IS and flutter -PT/OT; OOB as tolerated -continue q4 albuterol nebs, yupelri, brovana, pulmicort -continue solumedrol 40 mg q12; consider weaning to prednisone -prn mucinex -continue flonase, claritin -continue PPI -continue droplet precautions   HTN  Recent dental work (week of 11/28)  Hyperglycemia, steroid induced  P: -per primary    Best Practice (right click and "Reselect all SmartList Selections" daily)   Diet/type: Regular consistency (see orders)/ clear liquids/sips DVT prophylaxis: LMWH GI prophylaxis: PPI Lines: N/A Foley:   N/A Code Status:  full code Last date of multidisciplinary goals of care discussion [pta] 12/8 updated patient at bedside  Labs   CBC: Recent Labs  Lab 09/25/21 2050 09/26/21 0332 09/27/21 2232 09/28/21 0555 10/01/21 0326  WBC 7.9 5.8  --  7.6 7.4  NEUTROABS 5.9  --   --  5.2  --   HGB 14.3 14.3 15.6* 13.4 13.3  HCT 44.3 44.4 46.0 41.9 40.9  MCV 87.2 88.3  --  88.2 86.5  PLT 269 297  --  294 314     Basic Metabolic Panel: Recent Labs  Lab 09/26/21 0332 09/27/21 2232 09/28/21 0555 09/29/21 0214 09/30/21 0433 10/01/21 0326  NA 137 137 135 138 133* 136  K 3.9 4.9 4.1 4.6 4.9 4.6  CL 102  --  101 101 101 98  CO2 26  --  26 28 25 30   GLUCOSE 258*  --  135* 117* 130* 119*  BUN 15  --  19 17 19 18   CREATININE 1.17*  --  0.96 0.89 0.80 0.89  CALCIUM 8.2*  --  8.3* 8.9 8.7* 8.3*  MG  --   --  2.4  --   --  2.1  PHOS  --   --  5.1*  --   --   --     GFR: Estimated Creatinine Clearance: 85.1 mL/min (by C-G formula based on SCr of 0.89 mg/dL). Recent Labs  Lab 09/25/21 2050 09/26/21 0332 09/28/21 0555 10/01/21 0326  WBC 7.9 5.8 7.6 7.4     Liver Function Tests: No results for input(s): AST, ALT, ALKPHOS, BILITOT, PROT, ALBUMIN in the last 168 hours. No results for input(s): LIPASE, AMYLASE in the last 168 hours. No results for input(s): AMMONIA in the last 168 hours.  ABG    Component Value Date/Time   PHART 7.312 (L) 09/27/2021 2232   PCO2ART 57.6 (H) 09/27/2021 2232   PO2ART 79 (L) 09/27/2021 2232   HCO3 29.4 (H) 09/27/2021 2232   TCO2 31 09/27/2021 2232   ACIDBASEDEF 4.4 (H) 03/31/2021 0458   O2SAT 94.0 09/27/2021 2232      Coagulation Profile: No results for input(s): INR, PROTIME in the last 168 hours.  Cardiac Enzymes: No results for input(s): CKTOTAL, CKMB, CKMBINDEX, TROPONINI in the last 168 hours.  HbA1C: Hgb A1c MFr Bld  Date/Time Value Ref Range Status  03/31/2021 12:32 PM 5.9 (H) 4.8 - 5.6 % Final    Comment:    (NOTE)          Prediabetes: 5.7 - 6.4         Diabetes: >6.4         Glycemic control for adults with diabetes: <7.0   01/21/2021 03:04 AM 5.8 (H) 4.8 - 5.6 % Final    Comment:    (NOTE) Pre diabetes:          5.7%-6.4%  Diabetes:              >  6.4%  Glycemic control for   <7.0% adults with diabetes     CBG: Recent Labs  Lab 09/30/21 0335 09/30/21 0743 09/30/21 1219 09/30/21 1618 09/30/21 2239  GLUCAP 156* 94 99 103* 91      JD Anselm Lis [facility] 10/01/2021, 7:42 AM  Please see [email or web address] for pager details.  From 7A-7P if no response, please call [phone]. After hours, please call ELink [phone].

## 2021-10-01 NOTE — TOC Transition Note (Signed)
Transition of Care [facility]) - CM/SW Discharge Note   Patient Details  Name: OSCAR FORMAN MRN: [id number] Date of Birth: 1966/05/24  Transition of Care [facility] CM/SW Contact:  Tom-Johnson, Hershal Coria, RN Phone Number: 10/01/2021, 1:36 PM   Clinical Narrative:    Patient called CM requesting to leave AMA. States her 57 yr old son has been arrested and she needs to leave. CM advised her to speak with nurse and MD. CM notified nurse and MD and they went and talk with patient. Patient is scheduled for discharge. Need assistance with medication co pay and transportation. CM received permission from [facility] supervisor for co pay and transportation scheduled with [employer]. No further TOC needs noted.     Final next level of care: Home/Self Care Barriers to Discharge: Barriers Resolved   Patient Goals and CMS Choice Patient states their goals for this hospitalization and ongoing recovery are:: To go home CMS Medicare.gov Compare Post Acute Care list provided to:: Patient Choice offered to / list presented to : NA  Discharge Placement                       Discharge Plan and Services                DME Arranged: N/A         HH Arranged: NA HH Agency: NA        Social Determinants of Health (SDOH) Interventions     Readmission Risk Interventions No flowsheet data found.

## 2021-10-01 NOTE — Progress Notes (Addendum)
NAME:  Kathleen Rodriguez, MRN:  [id number], DOB:  [birth year], LOS: 4 ADMISSION DATE:  09/25/2021, CONSULTATION DATE:  09/27/2021 REFERRING MD:  Jerral Ralph - TRH, CHIEF COMPLAINT:  worsening asthma.   History of Present Illness:  55 year old woman who has been experiencing increasing dyspnea since a tooth extraction one week ago. Her GERD has also worsened.   She has known asthma and OSA followed by Dr Craige Cotta and idiopathic cough.  Hx of prior intubation for respiratory distress in 2016.    Admitted for persistent respiratory symptoms.  Pertinent  Medical History   Past Medical History:  Diagnosis Date   Arthritis    "knees, lower back; legs, ankles" (01/27/2016)   Asthma    followed by Dr. 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   Diabetes mellitus without complication (HCC)    Dyspnea    GERD (gastroesophageal reflux disease)    Hypertension    "doctor took me off RX in 2016" (01/27/2016)   Hypertension    Influenza B April 2014   Complicated by multi-organ failure   OSA on CPAP "since " 03/20/2013   Pneumonia 2016   Required emergent intubation    asthma exacerbation in 2016   Skin ulcer (HCC) secondary to bullous impetigo /trauma 08/29/2018   Sleep apnea    Tobacco abuse    Upper airway cough syndrome    Significant Hospital Events: Including procedures, antibiotic start and stop dates in addition to other pertinent events   12/4 admitted to hospitalist service.   12/5 PCCM consulted asthma, ? VCD, ENT eval, tx to ICU for BiPAP/ precedex 12/6 unable to come off BiPAP, remains on precedex, +parainfluenza 4 12/7: off bipap during the day; wore bipap at bedtime; off precedex 12/8: stable and transferring to floor  Interim History / Subjective:  Patient up in chair eating breakfast Wore Bipap overnight Breath sounds improving; minimal wheezing  Objective   Blood  pressure 125/70, pulse 87, temperature (!) 96.9 F (36.1 C), temperature source Oral, resp. rate 19, height 5\' 5"  (1.651 m), weight 105.4 kg, SpO2 94 %.    FiO2 (%):  [30 %] 30 %   Intake/Output Summary (Last 24 hours) at 10/01/2021 0842 Last data filed at 09/30/2021 0900 Gross per 24 hour  Intake 240 ml  Output 850 ml  Net -610 ml    Filed Weights   09/28/21 0315 09/29/21 0348 09/30/21 0449  Weight: 105.1 kg 105.3 kg 105.4 kg    Examination:  General:   NAD HEENT: MM pink/moist; [place] in place Neuro: Aox3; MAE CV: s1s2, RRR, no m/r/g PULM:  dim clear BS bilaterally; [place]  GI: soft, bsx4 active  Extremities: warm/dry, no edema  Skin: no rashes or lesions     Ancillary tests reviewed:   Outpatient PFTs 02/2017 demonstrate moderate airflow obstruction with mild gas trapping and a mild diffusion defect  Assessment & Plan:  Acute respiratory insufficiency with hypoxia Status asthmaticus, acute exacerbation of moderate asthma  Parainfluenza 4  Class 2 obesity GERD Sleep apnea  Tobacco abuse - ENT eval 12/5 with no evidence of VCD, upper airway obstruction, or masses.  Arytenoids were edematous and erythematous c/w reflux laryngitis.  - previous intubation 2016 for respiratory distress P: -[place] during the day; wean for sats >92% -BiPAP qhs and prn -continue pulm toiletry: IS and  flutter -PT/OT; OOB as tolerated -weaning albuterol nebs to tid, yupelri, brovana, pulmicort -continue prednisone -prn mucinex for congestion -continue flonase, claritin -continue PPI -continue droplet precautions  HTN  Recent dental work (week of 11/28)  Hyperglycemia, steroid induced  P: -per primary    Best Practice (right click and "Reselect all SmartList Selections" daily)   Diet/type: Regular consistency (see orders)/ clear liquids/sips DVT prophylaxis: LMWH GI prophylaxis: PPI Lines: N/A Foley:  N/A Code Status:  full code Last date of multidisciplinary goals of care discussion  [pta] 12/9 updated patient at bedside   JD D[facility] 10/01/2021, 8:42 AM  Please see [email or web address] for pager details.  From 7A-7P if no response, please call [phone]. After hours, please call ELink [phone].

## 2021-10-01 NOTE — Progress Notes (Signed)
Ambulated on RA 244feet. Tolerated well. SPO2 91%

## 2021-10-04 ENCOUNTER — Telehealth: Payer: Self-pay

## 2021-10-04 ENCOUNTER — Inpatient Hospital Stay: Payer: Medicaid Other | Admitting: Acute Care

## 2021-10-04 DIAGNOSIS — R7303 Prediabetes: Secondary | ICD-10-CM

## 2021-10-04 NOTE — Telephone Encounter (Signed)
From the discharge call:  She said she still has some chest tightness, she is taking the prednisone and getting ready to do a breathing treatment . She also explained that she fatigues trying to navigate the stairs in the home and was not able to keep her appointment with pulmonary this morning.  She will need to call and reschedule.   she needs a refill of clonidine and an order for a glucometer.     She said she left the instructions at the hospital but called and they are sending them to her.   she said she has all medications. They gave them to her when she was discharged.  She did not have any questions about her med regime.   she recently had all of her teeth extracted and has been having difficulty eating. The sutures are still intact but she is able to tolerate soft diet.   Scheduled to see Dr Alvis Lemmings  on 10/13/2021.

## 2021-10-04 NOTE — Telephone Encounter (Signed)
Transition Care Management Follow-up Telephone Call Date of discharge and from where: 10/01/2021, [facility]  How have you been since you were released from the hospital? She said she still has some chest tightness, she is taking the prednisone and getting ready to do a breathing treatment . She also explained that she fatigues trying to navigate the stairs in the home and was not able to keep her appointment with pulmonary this morning.   Any questions or concerns? Yes - she needs a refill of clonidine and an order for a glucometer.   Items Reviewed: Did the pt receive and understand the discharge instructions provided?  She said she left the instructions at the hospital but called and they are sending them to her.  Medications obtained and verified? Yes  - she said she has all medications. They gave them to her when she was discharged.  She did not have any questions about her med regime.  Other? No  Any new allergies since your discharge? No  Dietary orders reviewed? Yes - she recently had all of her teeth extracted and has been having difficulty eating. The sutures are still intact but she is able to tolerate soft diet.  Do you have support at home? Yes  - her husband and her son.  Home Care and Equipment/Supplies: Were home health services ordered? no If so, what is the name of the agency? N/a  Has the agency set up a time to come to the patient's home? not applicable Were any new equipment or medical supplies ordered?  No What is the name of the medical supply agency? N/a Were you able to get the supplies/equipment? not applicable Do you have any questions related to the use of the equipment or supplies? No  She has a nebulizer, cane and CPAP machine.    Functional Questionnaire: (I = Independent and D = Dependent) Follow up appointments reviewed:  ADLS:  independent. Has rollator to use with ambulation.   PCP Hospital f/u appt confirmed? Yes  Scheduled to see Dr Alvis Lemmings  on  10/13/2021.  Specialist Hospital f/u appt confirmed?  She missed her appointment with pulmonary today and will need to call and reschedule.   Are transportation arrangements needed? No  If their condition worsens, is the pt aware to call PCP or go to the Emergency Dept.? Yes Was the patient provided with contact information for the PCP's office or ED? Yes Was to pt encouraged to call back with questions or concerns? Yes

## 2021-10-05 MED ORDER — CLONIDINE HCL 0.2 MG PO TABS: 0.2000 mg | ORAL_TABLET | Freq: Two times a day (BID) | ORAL | 2 refills | Status: DC

## 2021-10-05 MED ORDER — ACCU-CHEK GUIDE VI STRP: ORAL_STRIP | 2 refills | Status: AC

## 2021-10-05 MED ORDER — ACCU-CHEK SOFTCLIX LANCETS MISC: 2 refills | Status: DC

## 2021-10-05 NOTE — Telephone Encounter (Signed)
Done

## 2021-10-05 NOTE — Telephone Encounter (Signed)
Call placed to patient and informed her that Dr Alvis Lemmings sent an order for clonidine and a glucometer to her pharmacy.

## 2021-10-08 ENCOUNTER — Other Ambulatory Visit: Payer: Self-pay

## 2021-10-08 ENCOUNTER — Inpatient Hospital Stay: Payer: Medicaid Other | Admitting: Adult Health

## 2021-10-13 ENCOUNTER — Ambulatory Visit: Payer: Medicaid Other | Admitting: Family Medicine

## 2021-10-14 ENCOUNTER — Other Ambulatory Visit: Payer: Self-pay

## 2021-10-14 ENCOUNTER — Other Ambulatory Visit: Payer: Self-pay | Admitting: Obstetrics and Gynecology

## 2021-10-14 NOTE — Patient Outreach (Signed)
Medicaid Managed Care   Nurse Care Manager Note  10/14/2021 Name:  Kathleen Rodriguez MRN:  [id number] DOB:  [birth year]  Kathleen Rodriguez is an 55 y.o. year old female who is a primary patient of Kathleen Rakes, MD.  The [facility] Managed Care Coordination team was consulted for assistance with:    Chronic healthcare management needs  [name] was given information about Medicaid Managed Care Coordination team services today. Kathleen Rodriguez Patient agreed to services and verbal consent obtained.  Engaged with patient by telephone for follow up visit in response to provider referral for case management and/or care coordination services.   Assessments/Interventions:  Review of past medical history, allergies, medications, health status, including review of consultants reports, laboratory and other test data, was performed as part of comprehensive evaluation and provision of chronic care management services.  SDOH (Social Determinants of Health) assessments and interventions performed: SDOH Interventions    Flowsheet Row Most Recent Value  SDOH Interventions   Physical Activity Interventions Other (Comments)  [patient recently hospitalized]       Care Plan  Allergies  Allergen Reactions   Tomato Hives, Itching and Other (See Comments)    ALSO REACTS TO KETCHUP   Latex Itching and Rash   Wool Alcohol [Lanolin] Itching    Medications Reviewed Today     Reviewed by [name], RN (Registered Nurse) on 10/14/21 at 64  Med List Status: <None>   Medication Order Taking? Sig Documenting Provider Last Dose Status Informant  Accu-Chek Softclix Lancets lancets [id number]  Use to check blood sugar once daily. Kathleen Rakes, MD  Active   albuterol (PROAIR HFA) 108 (90 Base) MCG/ACT inhaler [id number] No TAKE 2 PUFFS BY MOUTH EVERY 6 HOURS AS NEEDED FOR WHEEZE OR SHORTNESS OF BREATH  Patient not taking: Reported on 09/26/2021   Shawna Clamp, MD Not Taking Active Self, Multiple  Informants  aspirin 81 MG EC tablet [id number] No Take 1 tablet (81 mg total) by mouth daily.  Patient not taking: Reported on 09/26/2021   Kathleen Rakes, MD Not Taking Active Self, Multiple Informants           Med Note (Ahaana Rochette, Lorel Monaco   Fri Jul 30, 2021 12:47 PM)    Blood Glucose Monitoring Suppl (ACCU-CHEK GUIDE ME) w/Device KIT [id number] No Use to check blood sugar once daily. Kathleen Rakes, MD Taking Active Self, Multiple Informants  budesonide-formoterol (SYMBICORT) 160-4.5 MCG/ACT inhaler [id number]  Inhale 2 puffs into the lungs 2 (two) times daily. Mick Sell, PA-C  Active   cloNIDine (CATAPRES) 0.2 MG tablet [id number]  Take 1 tablet (0.2 mg total) by mouth 2 (two) times daily. For BP and hot flashes Kathleen Rakes, MD  Active   fluticasone (FLONASE) 50 MCG/ACT nasal spray [id number] No Place 1 spray into both nostrils daily as needed for allergies.  Patient not taking: Reported on 09/26/2021   Chesley Mires, MD Not Taking Active Self, Multiple Informants  glucose blood (ACCU-CHEK GUIDE) test strip [id number]  Use as instructed daily before breakfast. Kathleen Rakes, MD  Active   guaiFENesin (MUCINEX) 600 MG 12 hr tablet [id number]  Take 2 tablets (1,200 mg total) by mouth 2 (two) times daily as needed for to loosen phlegm or cough. Mick Sell, PA-C  Active   ipratropium-albuterol (DUONEB) 0.5-2.5 (3) MG/3ML SOLN [id number]  Use 1 vial by nebulization 3 (three) times daily. Mick Sell, PA-C  Active   loratadine (CLARITIN) 10 MG tablet [id number]  Take 1 tablet (  10 mg total) by mouth daily. Mick Sell, PA-C  Active   metFORMIN (GLUCOPHAGE) 500 MG tablet [id number] No TAKE 1 TABLET BY MOUTH 2 TIMES DAILY WITH A MEAL.  Patient taking differently: Take 500 mg by mouth 2 (two) times daily with a meal.   Kathleen Rakes, MD 09/24/2021 pm Active Self, Multiple Informants  Misc. Devices MISC [id number] No Blood pressure monitor  Dx: Hypertension Kathleen Rakes, MD Taking Active Self,  Multiple Informants  montelukast (SINGULAIR) 10 MG tablet [id number]  Take 1 tablet (10 mg total) by mouth at bedtime. Mick Sell, PA-C  Active   nicotine (NICODERM CQ - DOSED IN MG/24 HOURS) 14 mg/24hr patch [id number]  Place 1 patch (14 mg total) onto the skin daily. Mick Sell, PA-C  Active   pantoprazole (PROTONIX) 40 MG tablet [id number]  Take 1 tablet (40 mg total) by mouth daily at 12 noon. Mick Sell, PA-C  Active   predniSONE (DELTASONE) 20 MG tablet [id number]  Take 2 tablets (40 mg total) by mouth daily with breakfast. 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   Primary osteoarthritis of left knee 03/09/2016   Anemia, iron deficiency 08/12/2015   Dysfunctional uterine bleeding 12/28/2014   Morbid obesity ([place]) 05/16/2014   Upper airway cough syndrome 05/01/2014   GERD (gastroesophageal reflux disease) 04/08/2014   OSA (obstructive sleep apnea) 03/20/2013    Conditions to be addressed/monitored per PCP order:   chronic healthcare management needs, HTN, DM, OSA, GERD, asthma, osteoarthritis  Care Plan : General Plan of Care (Adult)  Updates made by [name], RN since 10/14/2021 12:00 AM     Problem: Health Promotion or Disease Self-Management (General Plan of Care)   Priority: High  Onset Date: 02/24/2021     Long-Range Goal: Self-Management Plan Developed   Start Date: 02/24/2021  Expected End Date: 11/30/2021  Recent Progress: Not on track  Priority: High  Note:   Current  Barriers:  Ineffective Self Health Maintenance-patient hospitalized 09/25/21 to 10/01/21 for flu, hypoxia, status asthmaticus.  Needs to schedule follow up appointments with PCP and Pulmonology.  Gets new dentures in 2 weeks.  No complaints today Transportation issues Update 04/12/21:  Patient has [place] as a resource as well. Currently UNABLE TO independently self manage needs related to chronic health conditions.  Knowledge Deficits related to short term plan for care coordination needs and long term plans for chronic disease management needs Nurse Case Manager Clinical Goal(s):  patient will work with care management team to address care coordination and chronic disease management needs related to Disease Management Educational Needs Care Coordination Medication Management and Education Medication Reconciliation Medication Assistance  Psychosocial Support   Interventions:  Evaluation of current treatment plan and patient's adherence to plan as established by provider. Reviewed medications with patient. Collaborated with pharmacy regarding medications. Discussed plans with patient for ongoing care management follow up and provided patient with direct contact information for care management team Advised patient,  providing education and rationale, to monitor blood pressure daily and record, calling provider for findings outside established parameters.  Reviewed scheduled/upcoming provider appointments. Advised patient, providing education and rationale, to check cbg and record, calling provider for findings outside established parameters.   Care guide referral for financial resources for rent, medicines-07/02/21-completed-patient provided resources. Care Guide referral for transportation. Update 04/12/21:  RNCM provided transportation resources to patient. Pharmacy referral for medication review. Update 04/12/21:  Patient has met with Pharmacist and continues to follow. Self  Care Activities:  Patient will self administer medications as prescribed Patient will attend all scheduled provider appointments Patient will call pharmacy for medication refills Patient will continue to perform ADL's independently Patient will call provider office for new concerns or questions Patient Goals: In the next 30 days, patient will check blood pressure and blood sugars as directed by provider. In the next 30 days, patient will attend all scheduled appointments. In the next 30 days, patient will meet with Pharmacist to discuss medications.- In the next 30 days, patient will schedule follow up appointments. Follow Up Plan: The patient has been provided with contact information for the care management team and has been advised to call with any health related questions or concerns.  The care management team will reach out to the patient again over the next 30 days.     Evidence-based guidance:   Review biopsychosocial determinants of health screens.  Review need for preventive screening based on age, sex, family history and health history.  Determine level of modifiable health risk. .  Discuss identified risks.  Identify areas where behavior change may lead to improved health.  Promote healthy lifestyle.  Evoke change talk using open-ended questions, pros and cons, as well as looking forward.  Identify and manage conditions or preconditions to reduce health risk.  Implement additional goals and interventions based on identified risk factors.     Follow Up:  Patient agrees to Care Plan and Follow-up.  Plan: The Managed Medicaid care management team will reach out to the patient again over the next 30 days. and The  Patient has been provided with contact information for the Managed Medicaid care management team and has been advised to call with any health related questions or concerns.  Date/time of next scheduled RN care management/care coordination outreach:  11/09/21 at 1030

## 2021-10-14 NOTE — Patient Instructions (Signed)
Hi Kathleen Rodriguez, thank you for speaking with me today-I am glad you are feeling better  Kathleen Rodriguez was given information about Medicaid Managed Care team care coordination services as a part of their [place] Medicaid benefit. [name] verbally consented to engagement with the [facility] Managed Care team.   If you are experiencing a medical emergency, please call 911 or report to your local emergency department or urgent care.   If you have a non-emergency medical problem during routine business hours, please contact your provider's office and ask to speak with a nurse.   For questions related to your [facility], please call: [phone] or visit the homepage here: [email or web address]  If you would like to schedule transportation through your [facility], please call the following number at least 2 days in advance of your appointment: [phone].   Call the [place] at [phone], at any time, 24 hours a day, 7 days a week. If you are in danger or need immediate medical attention call 911.  If you would like help to quit smoking, call 1-800-QUIT-NOW 613-037-8802) OR Espaol: 1-855-Djelo-Ya (4-142-395-3202) o para ms informacin haga clic aqu or Text READY to 200-400 to register via text  Kathleen Rodriguez - following are the goals we discussed in your visit today:   Goals Addressed             This Visit's Progress    Protect My Health       Timeframe:  Long-Range Goal Priority:  High Start Date:        02/24/21                     Expected End Date:       ongoing            Follow Up Date: 11/14/21   - schedule appointment for flu shot - schedule appointment for vaccines needed due to my age or health - schedule recommended health tests (blood work, mammogram, colonoscopy, pap test) - schedule and keep appointment for annual check-up    Update 10/14/21:  patient needs to schedule PCP and Pulmonology follow up appointments.  Will get new dentures in 2 weeks.  Why is this important?   Screening tests can find diseases early when they are easier to treat.  Your doctor or nurse will talk with you about which tests are important for you.  Getting shots for common diseases like the flu and shingles will help prevent them.       The patient verbalized understanding of instructions provided today and declined a print copy of patient instruction materials.   The Managed Medicaid care management team will reach out to the patient again over the next 30 days.  The  Patient has been provided with contact information for the Managed Medicaid care management team and has been advised to call with any health related questions or concerns.   Kathleen Raider RN, BSN [place] Management Coordinator - Managed Medicaid High Risk [phone]   Following is a copy of your plan of care:  Care Plan : General Plan of Care (Adult)  Updates made by [name], RN since 10/14/2021 12:00 AM     Problem: Health Promotion or Disease Self-Management (General Plan of Care)   Priority: High  Onset Date: 02/24/2021     Long-Range Goal: Self-Management Plan Developed   Start Date:  02/24/2021  Expected End Date: 11/30/2021  Recent Progress: Not on track  Priority: High  Note:   Current Barriers:  Ineffective Self Health Maintenance-patient hospitalized 09/25/21 to 10/01/21 for flu, hypoxia, status asthmaticus.  Needs to schedule follow up appointments with PCP and Pulmonology.  Gets new dentures in 2 weeks.  No complaints today Transportation issues Update 04/12/21:  Patient has [place] as a resource as well. Currently UNABLE TO independently self manage needs related to chronic health conditions.  Knowledge Deficits related to short term plan for care coordination needs and long term plans for  chronic disease management needs Nurse Case Manager Clinical Goal(s):  patient will work with care management team to address care coordination and chronic disease management needs related to Disease Management Educational Needs Care Coordination Medication Management and Education Medication Reconciliation Medication Assistance  Psychosocial Support   Interventions:  Evaluation of current treatment plan and patient's adherence to plan as established by provider. Reviewed medications with patient. Collaborated with pharmacy regarding medications. Discussed plans with patient for ongoing care management follow up and provided patient with direct contact information for care management team Advised patient, providing education and rationale, to monitor blood pressure daily and record, calling provider for findings outside established parameters.  Reviewed scheduled/upcoming provider appointments. Advised patient, providing education and rationale, to check cbg and record, calling provider for findings outside established parameters.   Care guide referral for financial resources for rent, medicines-07/02/21-completed-patient provided resources. Care Guide referral for transportation. Update 04/12/21:  RNCM provided transportation resources to patient. Pharmacy referral for medication review. Update 04/12/21:  Patient has met with Pharmacist and continues to follow. Self Care Activities:  Patient will self administer medications as prescribed Patient will attend all scheduled provider appointments Patient will call pharmacy for medication refills Patient will continue to perform ADL's independently Patient will call provider office for new concerns or questions Patient Goals: In the next 30 days, patient will check blood pressure and blood sugars as directed by provider. In the next 30 days, patient will attend all scheduled appointments. In the next 30 days, patient will meet with Pharmacist  to discuss medications.- In the next 30 days, patient will schedule follow up appointments. Follow Up Plan: The patient has been provided with contact information for the care management team and has been advised to call with any health related questions or concerns.  The care management team will reach out to the patient again over the next 30 days.     Evidence-based guidance:   Review biopsychosocial determinants of health screens.  Review need for preventive screening based on age, sex, family history and health history.  Determine level of modifiable health risk. .  Discuss identified risks.  Identify areas where behavior change may lead to improved health.  Promote healthy lifestyle.  Evoke change talk using open-ended questions, pros and cons, as well as looking forward.  Identify and manage conditions or preconditions to reduce health risk.  Implement additional goals and interventions based on identified risk factors.

## 2021-10-16 ENCOUNTER — Other Ambulatory Visit: Payer: Self-pay

## 2021-10-16 ENCOUNTER — Inpatient Hospital Stay (HOSPITAL_COMMUNITY)
Admission: EM | Admit: 2021-10-16 | Discharge: 2021-10-19 | DRG: 203 | Disposition: A | Discharge status: Home / Self Care | Payer: Medicaid Other | Attending: Internal Medicine | Admitting: Internal Medicine

## 2021-10-16 ENCOUNTER — Emergency Department (HOSPITAL_COMMUNITY): Payer: Medicaid Other

## 2021-10-16 DIAGNOSIS — Z96652 Presence of left artificial knee joint: Secondary | ICD-10-CM | POA: Diagnosis present

## 2021-10-16 DIAGNOSIS — K219 Gastro-esophageal reflux disease without esophagitis: Secondary | ICD-10-CM

## 2021-10-16 DIAGNOSIS — Z7982 Long term (current) use of aspirin: Secondary | ICD-10-CM

## 2021-10-16 DIAGNOSIS — Z720 Tobacco use: Secondary | ICD-10-CM | POA: Diagnosis not present

## 2021-10-16 DIAGNOSIS — Z7951 Long term (current) use of inhaled steroids: Secondary | ICD-10-CM

## 2021-10-16 DIAGNOSIS — J449 Chronic obstructive pulmonary disease, unspecified: Secondary | ICD-10-CM | POA: Insufficient documentation

## 2021-10-16 DIAGNOSIS — M25512 Pain in left shoulder: Secondary | ICD-10-CM | POA: Diagnosis not present

## 2021-10-16 DIAGNOSIS — R059 Cough, unspecified: Secondary | ICD-10-CM | POA: Diagnosis not present

## 2021-10-16 DIAGNOSIS — J4551 Severe persistent asthma with (acute) exacerbation: Secondary | ICD-10-CM

## 2021-10-16 DIAGNOSIS — J4541 Moderate persistent asthma with (acute) exacerbation: Principal | ICD-10-CM | POA: Diagnosis present

## 2021-10-16 DIAGNOSIS — E785 Hyperlipidemia, unspecified: Secondary | ICD-10-CM | POA: Diagnosis present

## 2021-10-16 DIAGNOSIS — I10 Essential (primary) hypertension: Secondary | ICD-10-CM

## 2021-10-16 DIAGNOSIS — Z6838 Body mass index (BMI) 38.0-38.9, adult: Secondary | ICD-10-CM

## 2021-10-16 DIAGNOSIS — E119 Type 2 diabetes mellitus without complications: Secondary | ICD-10-CM | POA: Diagnosis not present

## 2021-10-16 DIAGNOSIS — G4733 Obstructive sleep apnea (adult) (pediatric): Secondary | ICD-10-CM | POA: Diagnosis present

## 2021-10-16 DIAGNOSIS — J45901 Unspecified asthma with (acute) exacerbation: Secondary | ICD-10-CM | POA: Diagnosis present

## 2021-10-16 DIAGNOSIS — Z20822 Contact with and (suspected) exposure to covid-19: Secondary | ICD-10-CM | POA: Diagnosis present

## 2021-10-16 DIAGNOSIS — R062 Wheezing: Secondary | ICD-10-CM | POA: Diagnosis not present

## 2021-10-16 DIAGNOSIS — F419 Anxiety disorder, unspecified: Secondary | ICD-10-CM | POA: Diagnosis present

## 2021-10-16 DIAGNOSIS — R0902 Hypoxemia: Secondary | ICD-10-CM | POA: Diagnosis not present

## 2021-10-16 DIAGNOSIS — R000 Tachycardia, unspecified: Secondary | ICD-10-CM | POA: Diagnosis not present

## 2021-10-16 DIAGNOSIS — Z8249 Family history of ischemic heart disease and other diseases of the circulatory system: Secondary | ICD-10-CM

## 2021-10-16 DIAGNOSIS — R0689 Other abnormalities of breathing: Secondary | ICD-10-CM | POA: Diagnosis not present

## 2021-10-16 DIAGNOSIS — R52 Pain, unspecified: Secondary | ICD-10-CM

## 2021-10-16 DIAGNOSIS — Z79899 Other long term (current) drug therapy: Secondary | ICD-10-CM

## 2021-10-16 DIAGNOSIS — R509 Fever, unspecified: Secondary | ICD-10-CM | POA: Diagnosis not present

## 2021-10-16 DIAGNOSIS — Z7984 Long term (current) use of oral hypoglycemic drugs: Secondary | ICD-10-CM

## 2021-10-16 LAB — CBC WITH DIFFERENTIAL/PLATELET
Abs Immature Granulocytes: 0.02 10*3/uL (ref 0.00–0.07)
Basophils Absolute: 0 10*3/uL (ref 0.0–0.1)
Basophils Relative: 1 %
Eosinophils Absolute: 0.2 10*3/uL (ref 0.0–0.5)
Eosinophils Relative: 3 %
HCT: 43.1 % (ref 36.0–46.0)
Hemoglobin: 13.4 g/dL (ref 12.0–15.0)
Immature Granulocytes: 0 %
Lymphocytes Relative: 27 %
Lymphs Abs: 1.4 10*3/uL (ref 0.7–4.0)
MCH: 27.5 pg (ref 26.0–34.0)
MCHC: 31.1 g/dL (ref 30.0–36.0)
MCV: 88.3 fL (ref 80.0–100.0)
Monocytes Absolute: 0.7 10*3/uL (ref 0.1–1.0)
Monocytes Relative: 13 %
Neutro Abs: 2.9 10*3/uL (ref 1.7–7.7)
Neutrophils Relative %: 56 %
Platelets: 235 10*3/uL (ref 150–400)
RBC: 4.88 MIL/uL (ref 3.87–5.11)
RDW: 16.6 % — ABNORMAL HIGH (ref 11.5–15.5)
WBC: 5.2 10*3/uL (ref 4.0–10.5)
nRBC: 0 % (ref 0.0–0.2)

## 2021-10-16 LAB — BASIC METABOLIC PANEL
Anion gap: 7 (ref 5–15)
BUN: 11 mg/dL (ref 6–20)
CO2: 26 mmol/L (ref 22–32)
Calcium: 7.9 mg/dL — ABNORMAL LOW (ref 8.9–10.3)
Chloride: 106 mmol/L (ref 98–111)
Creatinine, Ser: 0.85 mg/dL (ref 0.44–1.00)
GFR, Estimated: >60 mL/min (ref >60)
Glucose, Bld: 103 mg/dL — ABNORMAL HIGH (ref 70–99)
Potassium: 3.9 mmol/L (ref 3.5–5.1)
Sodium: 139 mmol/L (ref 135–145)

## 2021-10-16 LAB — RESP PANEL BY RT-PCR (FLU A&B, COVID) ARPGX2
Influenza A by PCR: NEGATIVE
Influenza B by PCR: NEGATIVE
SARS Coronavirus 2 by RT PCR: NEGATIVE

## 2021-10-16 LAB — GLUCOSE, CAPILLARY
Glucose-Capillary: 179 mg/dL — ABNORMAL HIGH (ref 70–99)
Glucose-Capillary: 174 mg/dL — ABNORMAL HIGH (ref 70–99)
Glucose-Capillary: 123 mg/dL — ABNORMAL HIGH (ref 70–99)

## 2021-10-16 LAB — HEMOGLOBIN A1C
Hgb A1c MFr Bld: 5.9 % — ABNORMAL HIGH (ref 4.8–5.6)
Mean Plasma Glucose: 122.63 mg/dL

## 2021-10-16 LAB — TROPONIN I (HIGH SENSITIVITY): Troponin I (High Sensitivity): 4 ng/L (ref <18)

## 2021-10-16 LAB — D-DIMER, QUANTITATIVE: D-Dimer, Quant: 0.32 ug/mL-FEU (ref 0.00–0.50)

## 2021-10-16 MED ORDER — BUDESONIDE 0.5 MG/2ML IN SUSP
0.5000 mg | Freq: Two times a day (BID) | RESPIRATORY_TRACT | Status: DC
  Administered 2021-10-16 – 2021-10-19 (×6): 0.5 mg via RESPIRATORY_TRACT
  Filled 2021-10-16 (×6): qty 2

## 2021-10-16 MED ORDER — ARFORMOTEROL TARTRATE 15 MCG/2ML IN NEBU
15.0000 ug | INHALATION_SOLUTION | Freq: Two times a day (BID) | RESPIRATORY_TRACT | Status: DC
  Administered 2021-10-16 – 2021-10-19 (×6): 15 ug via RESPIRATORY_TRACT
  Filled 2021-10-16 (×6): qty 2

## 2021-10-16 MED ORDER — METHYLPREDNISOLONE SODIUM SUCC 125 MG IJ SOLR
125.0000 mg | Freq: Once | INTRAMUSCULAR | Status: AC
  Administered 2021-10-16: 06:00:00 125 mg via INTRAVENOUS
  Filled 2021-10-16: qty 2

## 2021-10-16 MED ORDER — IPRATROPIUM-ALBUTEROL 0.5-2.5 (3) MG/3ML IN SOLN
3.0000 mL | Freq: Four times a day (QID) | RESPIRATORY_TRACT | Status: DC | PRN
  Administered 2021-10-16: 18:00:00 3 mL via RESPIRATORY_TRACT
  Filled 2021-10-16: qty 3

## 2021-10-16 MED ORDER — IPRATROPIUM-ALBUTEROL 0.5-2.5 (3) MG/3ML IN SOLN
3.0000 mL | Freq: Three times a day (TID) | RESPIRATORY_TRACT | Status: DC
  Administered 2021-10-16: 10:00:00 3 mL via RESPIRATORY_TRACT
  Filled 2021-10-16: qty 3

## 2021-10-16 MED ORDER — MONTELUKAST SODIUM 10 MG PO TABS
10.0000 mg | ORAL_TABLET | Freq: Every day | ORAL | Status: DC
  Administered 2021-10-16 – 2021-10-18 (×3): 10 mg via ORAL
  Filled 2021-10-16 (×3): qty 1

## 2021-10-16 MED ORDER — INSULIN ASPART 100 UNIT/ML IJ SOLN
0.0000 [IU] | Freq: Three times a day (TID) | INTRAMUSCULAR | Status: DC
  Administered 2021-10-16 – 2021-10-17 (×2): 2 [IU] via SUBCUTANEOUS
  Administered 2021-10-17 – 2021-10-18 (×2): 1 [IU] via SUBCUTANEOUS

## 2021-10-16 MED ORDER — PREDNISONE 20 MG PO TABS
40.0000 mg | ORAL_TABLET | Freq: Every day | ORAL | Status: DC
  Administered 2021-10-17: 09:00:00 40 mg via ORAL
  Filled 2021-10-16 (×2): qty 2

## 2021-10-16 MED ORDER — CLONIDINE HCL 0.2 MG PO TABS
0.2000 mg | ORAL_TABLET | Freq: Two times a day (BID) | ORAL | Status: DC
  Administered 2021-10-16 – 2021-10-19 (×6): 0.2 mg via ORAL
  Filled 2021-10-16 (×6): qty 1

## 2021-10-16 MED ORDER — LORATADINE 10 MG PO TABS
10.0000 mg | ORAL_TABLET | Freq: Every day | ORAL | Status: DC
  Administered 2021-10-16 – 2021-10-19 (×4): 10 mg via ORAL
  Filled 2021-10-16 (×4): qty 1

## 2021-10-16 MED ORDER — IPRATROPIUM BROMIDE 0.02 % IN SOLN
0.5000 mg | Freq: Once | RESPIRATORY_TRACT | Status: AC
  Administered 2021-10-16: 06:00:00 0.5 mg via RESPIRATORY_TRACT
  Filled 2021-10-16: qty 2.5

## 2021-10-16 MED ORDER — GUAIFENESIN ER 600 MG PO TB12
600.0000 mg | ORAL_TABLET | Freq: Two times a day (BID) | ORAL | Status: DC
  Administered 2021-10-16 – 2021-10-19 (×7): 600 mg via ORAL
  Filled 2021-10-16 (×7): qty 1

## 2021-10-16 MED ORDER — CALCIUM GLUCONATE-NACL 2-0.675 GM/100ML-% IV SOLN
2.0000 g | Freq: Once | INTRAVENOUS | Status: AC
  Administered 2021-10-16: 10:00:00 2000 mg via INTRAVENOUS
  Filled 2021-10-16: qty 100

## 2021-10-16 MED ORDER — NICOTINE 14 MG/24HR TD PT24
14.0000 mg | MEDICATED_PATCH | Freq: Every day | TRANSDERMAL | Status: DC
  Filled 2021-10-16: qty 1

## 2021-10-16 MED ORDER — SODIUM CHLORIDE 0.9 % IV SOLN
500.0000 mg | INTRAVENOUS | Status: DC
  Administered 2021-10-17 – 2021-10-18 (×2): 500 mg via INTRAVENOUS
  Filled 2021-10-16 (×2): qty 5

## 2021-10-16 MED ORDER — IPRATROPIUM BROMIDE 0.02 % IN SOLN
0.5000 mg | Freq: Once | RESPIRATORY_TRACT | Status: AC
  Administered 2021-10-16: 03:00:00 0.5 mg via RESPIRATORY_TRACT
  Filled 2021-10-16: qty 2.5

## 2021-10-16 MED ORDER — SODIUM CHLORIDE 0.9 % IV SOLN: 500.0000 mg | Freq: Once | INTRAVENOUS | Status: DC

## 2021-10-16 MED ORDER — METHOCARBAMOL 500 MG PO TABS
500.0000 mg | ORAL_TABLET | Freq: Once | ORAL | Status: AC
  Administered 2021-10-16: 21:00:00 500 mg via ORAL
  Filled 2021-10-16: qty 1

## 2021-10-16 MED ORDER — ALBUTEROL SULFATE (2.5 MG/3ML) 0.083% IN NEBU
5.0000 mg | INHALATION_SOLUTION | Freq: Once | RESPIRATORY_TRACT | Status: AC
  Administered 2021-10-16: 03:00:00 5 mg via RESPIRATORY_TRACT
  Filled 2021-10-16: qty 6

## 2021-10-16 MED ORDER — ACETAMINOPHEN 650 MG RE SUPP: 650.0000 mg | Freq: Four times a day (QID) | RECTAL | Status: DC | PRN

## 2021-10-16 MED ORDER — PANTOPRAZOLE SODIUM 40 MG PO TBEC
40.0000 mg | DELAYED_RELEASE_TABLET | Freq: Every day | ORAL | Status: DC
  Administered 2021-10-16 – 2021-10-19 (×4): 40 mg via ORAL
  Filled 2021-10-16 (×4): qty 1

## 2021-10-16 MED ORDER — FLUTICASONE PROPIONATE 50 MCG/ACT NA SUSP
1.0000 | Freq: Every day | NASAL | Status: DC | PRN
  Filled 2021-10-16: qty 16

## 2021-10-16 MED ORDER — SODIUM CHLORIDE 0.9 % IV SOLN
500.0000 mg | Freq: Once | INTRAVENOUS | Status: AC
  Administered 2021-10-16: 08:00:00 500 mg via INTRAVENOUS
  Filled 2021-10-16: qty 5

## 2021-10-16 MED ORDER — ACETAMINOPHEN 325 MG PO TABS
650.0000 mg | ORAL_TABLET | Freq: Four times a day (QID) | ORAL | Status: DC | PRN
  Administered 2021-10-16 – 2021-10-18 (×2): 650 mg via ORAL
  Filled 2021-10-16 (×3): qty 2

## 2021-10-16 MED ORDER — ALBUTEROL SULFATE (2.5 MG/3ML) 0.083% IN NEBU
10.0000 mg/h | INHALATION_SOLUTION | RESPIRATORY_TRACT | Status: DC
  Administered 2021-10-16: 06:00:00 10 mg/h via RESPIRATORY_TRACT
  Filled 2021-10-16: qty 12

## 2021-10-16 MED ORDER — ENOXAPARIN SODIUM 60 MG/0.6ML IJ SOSY
55.0000 mg | PREFILLED_SYRINGE | INTRAMUSCULAR | Status: DC
  Administered 2021-10-16 – 2021-10-18 (×3): 55 mg via SUBCUTANEOUS
  Filled 2021-10-16 (×2): qty 0.6
  Filled 2021-10-16: qty 0.55
  Filled 2021-10-16 (×2): qty 0.6

## 2021-10-16 MED ORDER — SODIUM CHLORIDE 0.9% FLUSH
3.0000 mL | Freq: Two times a day (BID) | INTRAVENOUS | Status: DC
  Administered 2021-10-16 – 2021-10-19 (×7): 3 mL via INTRAVENOUS

## 2021-10-16 MED ORDER — ENOXAPARIN SODIUM 40 MG/0.4ML IJ SOSY: 40.0000 mg | PREFILLED_SYRINGE | INTRAMUSCULAR | Status: DC

## 2021-10-16 MED ORDER — MAGNESIUM SULFATE 2 GM/50ML IV SOLN
2.0000 g | INTRAVENOUS | Status: AC
  Administered 2021-10-16: 06:00:00 2 g via INTRAVENOUS
  Filled 2021-10-16: qty 50

## 2021-10-16 NOTE — ED Notes (Signed)
Pt given [place] bag and diet pop

## 2021-10-16 NOTE — ED Provider Notes (Signed)
Emergency Medicine Provider Triage Evaluation Note  Kathleen Rodriguez , a 55 y.o. female  was evaluated in triage.  Pt complains of increased WOB and SOB. Onset today, but worsening x 1 hour. Tried home albuterol w/o relief. Given 1 DuoNeb en route with EMS. Patient notes subjective fever, chills, cough. Admitted to the ICU for asthma exacerbation earlier this month. No intubations for asthma since 2016. Hx asthma, COPD (patient of Dr. Craige Cotta with [facility]), OSA, DMT2, HTN, GERD, obesity, CHF.  Review of Systems  Positive: As above Negative: As above  Physical Exam  There were no vitals taken for this visit. Gen:   Awake, anxious appearing Resp:  Tachypnea and dyspnea noted. No accessory muscle use.  MSK:   Moves extremities without difficulty  Other:  Diffuse expiratory wheezing throughout  Medical Decision Making  Medically screening exam initiated at 2:48 AM.  Appropriate orders placed.  CEARRA PORTNOY was informed that the remainder of the evaluation will be completed by another provider, this initial triage assessment does not replace that evaluation, and the importance of remaining in the ED until their evaluation is complete.  Asthma exacerbation   Antony Madura, PA-C 10/16/21 0251    Nira Conn, MD 10/17/21 [phone]

## 2021-10-16 NOTE — ED Triage Notes (Signed)
Pt brought to ED triage via wheelchair by [facility] with c/o asthma episode that is not relieved by inhaler. Pt actively coughing and does not speak in full sentences.

## 2021-10-16 NOTE — H&P (Signed)
History and Physical    Kathleen Rodriguez:[id number] DOB: 1966-04-09 DOA: 10/16/2021  Referring MD/NP/PA: Marye Round, MD PCP: [name], MD  Patient coming from: Home  Chief Complaint: Cough and chest discomfort.  I have personally briefly reviewed patient's old medical records in [place]   HPI: Kathleen Rodriguez is a 55 y.o. female with medical history significant of hypertension, hyperlipidemia, DM type II, asthma, obesity, and OSA who presents with complaints of cough and chest discomfort.  Just recently been hospitalized from 12/3-12/9 with status asthmaticus secondary to parainfluenza 4 requiring ICU transfer placed on BiPAP and requiring Precedex drip due to severe anxiety.  No upper airway obstruction was noted and patient was transitioned from IV steroids to p.o..  Patient reports that after getting home she completed the steroid taper.  Over the last 3 days she has had increased nonproductive cough with intermittent wheezing.  Complains of chest discomfort secondary to coughing.  Denies having any significant fever, chills, nausea, vomiting, diarrhea, calf pain, leg swelling, or dysuria.  Using her inhalers up to 4 times daily without significant relief in symptoms.  Over the last 4 days she has had some aching in her left shoulder especially with reaching for things or externally rotating her arm.  She has had some associated numbness and tingling sensations down her left arm states that it felt, like she had slept wrong.  Denies any recent injury or remote injury to her knowledge.  ED Course: On admission to the emergency department patient was seen to have temperature of 99.8 F, heart rate 91-1 16, respirations 20-29, blood pressures maintained, and O2 saturations currently maintained on 2 L of nasal cannula oxygen.  Labs significant for calcium 7.9.  Chest x-ray showed no acute abnormality.  Influenza and COVID-19 screening were negative.  Patient had been given DuoNeb  breathing treatments, hour-long continuous albuterol neb, magnesium sulfate 2 g IV, azithromycin IV, and Solu-Medrol 125 mg IV.  Review of Systems  Constitutional:  Positive for malaise/fatigue. Negative for fever.  HENT:  Negative for ear discharge and nosebleeds.   Eyes:  Negative for photophobia and pain.  Respiratory:  Positive for cough and shortness of breath.   Cardiovascular:  Positive for chest pain. Negative for leg swelling.  Gastrointestinal:  Negative for abdominal pain and vomiting.  Genitourinary:  Negative for dysuria and hematuria.  Musculoskeletal:  Positive for joint pain and myalgias.  Skin:  Negative for rash.  Neurological:  Positive for sensory change. Negative for loss of consciousness.  Psychiatric/Behavioral:  Negative for memory loss.    Past Medical History:  Diagnosis Date   Arthritis    "knees, lower back; legs, ankles" (01/27/2016)   Asthma    followed by Dr. 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   Diabetes mellitus without complication ([place])    Dyspnea    GERD (gastroesophageal reflux disease)    Hypertension    "doctor took me off RX in 2016" (01/27/2016)   Hypertension    Influenza B April 4166   Complicated by multi-organ failure   OSA on CPAP "since " 03/20/2013   Pneumonia 2016   Required emergent intubation    asthma exacerbation in 2016   Skin ulcer ([place]) secondary to bullous impetigo Marilynn Rail 08/29/2018   Sleep apnea    Tobacco abuse  Upper airway cough syndrome     Past Surgical History:  Procedure Laterality Date   BREAST SURGERY Right 1980   as a teenager , cyst was benign   CESAREAN SECTION  2006   LACERATION REPAIR Right ~ 1997   "tried to cut myself"   TOTAL KNEE ARTHROPLASTY Left 09/01/2016   Procedure: LEFT TOTAL KNEE ARTHROPLASTY;  Surgeon: Leandrew Koyanagi, MD;  Location: [facility];  Service: Orthopedics;  Laterality:  Left;   TUBAL LIGATION  2006     reports that she quit smoking about 8 years ago. Her smoking use included cigarettes. She started smoking about 29 years ago. She has a 5.25 pack-year smoking history. She has never used smokeless tobacco. She reports that she does not currently use drugs after having used the following drugs: Cocaine and "Crack" cocaine. No history on file for alcohol use.  Allergies  Allergen Reactions   Tomato Hives, Itching and Other (See Comments)    ALSO REACTS TO KETCHUP   Latex Itching and Rash   Wool Alcohol [Lanolin] Itching    Family History  Problem Relation Age of Onset   Asthma Mother    Heart murmur Mother    Cancer Maternal Grandmother    Heart disease Maternal Grandmother    Hypertension Maternal Grandmother    Cancer Paternal Grandmother    Hypertension Mother    HIV/AIDS Father     Prior to Admission medications   Medication Sig Start Date End Date Taking? Authorizing Provider  albuterol (PROAIR HFA) 108 (90 Base) MCG/ACT inhaler TAKE 2 PUFFS BY MOUTH EVERY 6 HOURS AS NEEDED FOR WHEEZE OR SHORTNESS OF BREATH Patient taking differently: 2 puffs every 6 (six) hours as needed for wheezing or shortness of breath. 04/02/21  Yes Shawna Clamp, MD  budesonide-formoterol [facility]) 160-4.5 MCG/ACT inhaler Inhale 2 puffs into the lungs 2 (two) times daily. 10/01/21  Yes Mick Sell, PA-C  fluticasone (FLONASE) 50 MCG/ACT nasal spray Place 1 spray into both nostrils daily as needed for allergies. 01/04/21  Yes Chesley Mires, MD  guaiFENesin (MUCINEX) 600 MG 12 hr tablet Take 2 tablets (1,200 mg total) by mouth 2 (two) times daily as needed for to loosen phlegm or cough. 10/01/21  Yes Mick Sell, PA-C  ipratropium-albuterol (DUONEB) 0.5-2.5 (3) MG/3ML SOLN Use 1 vial by nebulization 3 (three) times daily. 10/01/21  Yes Mick Sell, PA-C  loratadine (CLARITIN) 10 MG tablet Take 1 tablet (10 mg total) by mouth daily. 10/02/21  Yes Andres Labrum D, PA-C   metFORMIN (GLUCOPHAGE) 500 MG tablet TAKE 1 TABLET BY MOUTH 2 TIMES DAILY WITH A MEAL. Patient taking differently: Take 500 mg by mouth 2 (two) times daily with a meal. 05/26/21  Yes Newlin, [name], MD  montelukast (SINGULAIR) 10 MG tablet Take 1 tablet (10 mg total) by mouth at bedtime. 10/01/21  Yes Mick Sell, PA-C  pantoprazole (PROTONIX) 40 MG tablet Take 1 tablet (40 mg total) by mouth daily at 12 noon. Patient taking differently: Take 40 mg by mouth daily. 10/01/21  Yes Andres Labrum D, PA-C  Accu-Chek Softclix Lancets lancets Use to check blood sugar once daily. 10/05/21   [name], MD  aspirin 81 MG EC tablet Take 1 tablet (81 mg total) by mouth daily. Patient not taking: Reported on 09/26/2021 02/07/19   [name], MD  Blood Glucose Monitoring Suppl (ACCU-CHEK GUIDE ME) w/Device KIT Use to check blood sugar once daily. 05/04/21   [name], MD  cloNIDine (CATAPRES)  0.2 MG tablet Take 1 tablet (0.2 mg total) by mouth 2 (two) times daily. For BP and hot flashes 10/05/21   [name], MD  glucose blood (ACCU-CHEK GUIDE) test strip Use as instructed daily before breakfast. 10/05/21   [name], MD  Misc. Devices MISC Blood pressure monitor  Dx: Hypertension 02/07/19   [name], MD  nicotine (NICODERM CQ - DOSED IN MG/24 HOURS) 14 mg/24hr patch Place 1 patch (14 mg total) onto the skin daily. Patient not taking: Reported on 10/16/2021 10/02/21   Mick Sell, PA-C  predniSONE (DELTASONE) 20 MG tablet Take 2 tablets (40 mg total) by mouth daily with breakfast. Patient not taking: Reported on 10/16/2021 10/02/21   Mick Sell, PA-C    Physical Exam:  Constitutional: Middle-aged female who currently who appears to be in no acute distress Vitals:   10/16/21 0730 10/16/21 0758 10/16/21 0800 10/16/21 0830  BP: 133/72  123/61 130/67  Pulse: (!) 106  (!) 108 99  Resp: (!) 24  (!) 27 (!) 21  Temp:  98.7 F (37.1 C)    TempSrc:  Oral    SpO2: 91%  94% 97%   Weight:      Height:       Eyes: PERRL, lids and conjunctivae normal ENMT: Mucous membranes are moist. Posterior pharynx clear of any exudate or lesions.  Neck: normal, supple, no masses, no thyromegaly Respiratory: Normal respiratory effort with wheezing heard in both lung fields.  No significant rales or rhonchi appreciated.  O2 saturations currently maintained on room air. Cardiovascular: Regular rate and rhythm, no murmurs / rubs / gallops. No extremity edema. 2+ pedal pulses. No carotid bruits.  Abdomen: no tenderness, no masses palpated. No hepatosplenomegaly. Bowel sounds positive.  Musculoskeletal: no clubbing / cyanosis. No joint deformity upper and lower extremities.  Decreased range of motion noted to the left shoulder especially with external rotation and patient not able to raise arm above 75 degrees. Skin: no rashes, lesions, ulcers. No induration Neurologic: CN 2-12 grossly intact. Sensation intact, DTR normal. Strength 5/5 in all 4.  Psychiatric: Normal judgment and insight. Alert and oriented x 3. Normal mood.     Labs on Admission: I have personally reviewed following labs and imaging studies  CBC: Recent Labs  Lab 10/16/21 0257  WBC 5.2  NEUTROABS 2.9  HGB 13.4  HCT 43.1  MCV 88.3  PLT 638   Basic Metabolic Panel: Recent Labs  Lab 10/16/21 0257  NA 139  K 3.9  CL 106  CO2 26  GLUCOSE 103*  BUN 11  CREATININE 0.85  CALCIUM 7.9*   GFR: Estimated Creatinine Clearance: 89.1 mL/min (by C-G formula based on SCr of 0.85 mg/dL). Liver Function Tests: No results for input(s): AST, ALT, ALKPHOS, BILITOT, PROT, ALBUMIN in the last 168 hours. No results for input(s): LIPASE, AMYLASE in the last 168 hours. No results for input(s): AMMONIA in the last 168 hours. Coagulation Profile: No results for input(s): INR, PROTIME in the last 168 hours. Cardiac Enzymes: No results for input(s): CKTOTAL, CKMB, CKMBINDEX, TROPONINI in the last 168 hours. BNP (last 3  results) No results for input(s): PROBNP in the last 8760 hours. HbA1C: No results for input(s): HGBA1C in the last 72 hours. CBG: No results for input(s): GLUCAP in the last 168 hours. Lipid Profile: No results for input(s): CHOL, HDL, LDLCALC, TRIG, CHOLHDL, LDLDIRECT in the last 72 hours. Thyroid Function Tests: No results for input(s): TSH, T4TOTAL, FREET4, T3FREE, THYROIDAB in the  last 72 hours. Anemia Panel: No results for input(s): VITAMINB12, FOLATE, FERRITIN, TIBC, IRON, RETICCTPCT in the last 72 hours. Urine analysis:    Component Value Date/Time   COLORURINE AMBER (A) 06/17/2021 1341   APPEARANCEUR CLEAR 06/17/2021 1341   LABSPEC 1.027 06/17/2021 1341   PHURINE 5.0 06/17/2021 1341   GLUCOSEU NEGATIVE 06/17/2021 1341   HGBUR NEGATIVE 06/17/2021 1341   BILIRUBINUR NEGATIVE 06/17/2021 1341   BILIRUBINUR small 05/26/2017 1539   KETONESUR 20 (A) 06/17/2021 1341   PROTEINUR NEGATIVE 06/17/2021 1341   UROBILINOGEN 1.0 05/26/2017 1539   UROBILINOGEN 1.0 03/22/2014 0805   NITRITE NEGATIVE 06/17/2021 1341   LEUKOCYTESUR NEGATIVE 06/17/2021 1341   Sepsis Labs: Recent Results (from the past 240 hour(s))  Resp Panel by RT-PCR (Flu A&B, Covid) Nasopharyngeal Swab     Status: None   Collection Time: 10/16/21  3:35 AM   Specimen: Nasopharyngeal Swab; Nasopharyngeal(NP) swabs in vial transport medium  Result Value Ref Range Status   SARS Coronavirus 2 by RT PCR NEGATIVE NEGATIVE Final    Comment: (NOTE) SARS-CoV-2 target nucleic acids are NOT DETECTED.  The SARS-CoV-2 RNA is generally detectable in upper respiratory specimens during the acute phase of infection. The lowest concentration of SARS-CoV-2 viral copies this assay can detect is 138 copies/mL. A negative result does not preclude SARS-Cov-2 infection and should not be used as the sole basis for treatment or other patient management decisions. A negative result may occur with  improper specimen collection/handling,  submission of specimen other than nasopharyngeal swab, presence of viral mutation(s) within the areas targeted by this assay, and inadequate number of viral copies(<138 copies/mL). A negative result must be combined with clinical observations, patient history, and epidemiological information. The expected result is Negative.  Fact Sheet for Patients:  EntrepreneurPulse.com.au  Fact Sheet for Healthcare Providers:  IncredibleEmployment.be  This test is no t yet approved or cleared by the [place] FDA and  has been authorized for detection and/or diagnosis of SARS-CoV-2 by FDA under an Emergency Use Authorization (EUA). This EUA will remain  in effect (meaning this test can be used) for the duration of the COVID-19 declaration under Section 564(b)(1) of the Act, 21 U.S.C.section 360bbb-3(b)(1), unless the authorization is terminated  or revoked sooner.       Influenza A by PCR NEGATIVE NEGATIVE Final   Influenza B by PCR NEGATIVE NEGATIVE Final    Comment: (NOTE) The Xpert Xpress SARS-CoV-2/FLU/RSV plus assay is intended as an aid in the diagnosis of influenza from Nasopharyngeal swab specimens and should not be used as a sole basis for treatment. Nasal washings and aspirates are unacceptable for Xpert Xpress SARS-CoV-2/FLU/RSV testing.  Fact Sheet for Patients: EntrepreneurPulse.com.au  Fact Sheet for Healthcare Providers: IncredibleEmployment.be  This test is not yet approved or cleared by the [place] FDA and has been authorized for detection and/or diagnosis of SARS-CoV-2 by FDA under an Emergency Use Authorization (EUA). This EUA will remain in effect (meaning this test can be used) for the duration of the COVID-19 declaration under Section 564(b)(1) of the Act, 21 U.S.C. section 360bbb-3(b)(1), unless the authorization is terminated or revoked.  Performed at [facility], [place] [address] [place], [place] [address]      Radiological Exams on Admission: DG Chest 1 View  Result Date: 10/16/2021 CLINICAL DATA:  Shortness of breath EXAM: CHEST  1 VIEW COMPARISON:  10/01/2021 FINDINGS: The heart size and mediastinal contours are within normal limits. Both lungs are clear. The visualized skeletal  structures are unremarkable. IMPRESSION: No active disease. Electronically Signed   By: Inez Catalina M.D.   On: 10/16/2021 04:02    EKG: Independently reviewed.  Sinus tachycardia 111 bpm  Assessment/Plan Asthma exacerbation: Acute on chronic.  Patient had just recently been hospitalized from 12/3-12/9 for asthma exacerbation temporarily requiring placement on BiPAP.  She completed steroid taper after getting home, but over the last 3 days had reported increased cough with shortness of breath.  Physical exam revealed wheezing. CXR showed no acute abnormality.  Suspected symptoms related to asthma exacerbation -Admit to a MedSurg bed -Continuous pulse oximetry with nasal cannula oxygen maintain O2 saturations greater than 92% -Wean to room air as tolerated -Check D-dimer and troponin given complaints of chest pain.  No prior history of elevated levels for either study in the past.  May warrant further work-up depending on findings -DuoNebs 3 times daily -Brovana and budesonide nebs twice daily. -Prednisone 40 mg p.o. daily in a.m.  Unless patient with significant wheezing for which IV steroids should be continued. -Azithromycin -Continue Claritin and Singulair -Mucinex  Left shoulder discomfort/pain: Patient noted to have aching discomfort other left shoulder with decreased range of motion on physical exam.  No bony abnormality appreciated.  Question possibility of muscle strain, rotator cuff issue, or possibly be related in part to hypocalcemia. -May warrant further work-up  Hypocalcemia: Calcium noted to be 7.9 on admission. -Give 2 g of calcium gluconate IV x1 dose. -Continue  to monitor and replace as needed  Essential hypertension: Blood pressures ranged from 91/78-166/91.  Patient had been recently prescribed clonidine, but had not picked up the medication as of yet.  She reports that she was started on clonidine 0.2 mg twice daily to help with hot flashes and blood pressure. -Continue clonidine as tolerated but hold for systolic bp <884  Diabetes mellitus type 2: Glucose 103 on admission.  Last hemoglobin A1c 5.9 on 03/31/21.  Home medication regimen includes metformin 500 mg twice daily. -Hypoglycemic protocols -Hold metformin -CBGs before every meal with sensitive SSI  Tobacco abuse: Patient states that she has not smoked tobacco in 14 days and denies any need for a nicotine patch -Continue to encourage continued cessation of tobacco use  OSA  -CPAP per RT nightly  GERD -Continue Protonix  DVT prophylaxis: Lovenox Code Status: Full Family Communication:  Disposition Plan: Hopefully discharge home once medically stable Consults called: None Admission status: Observation  Norval Morton MD [facility]   If 7PM-7AM, please contact night-coverage   10/16/2021, 9:13 AM

## 2021-10-16 NOTE — ED Provider Notes (Signed)
Patient initially seen by Dr. Eudelia Bunch.  Please see his note.  Patient has received steroids, magnesium and breathing treatments.  She continues to feel short of breath.  Still has wheezing noted on exam although not in any severe distress.  Patient does not feel that she can safely manage at home.  We will consult with medical service for admission.   Linwood Dibbles, MD 10/16/21 [phone]

## 2021-10-16 NOTE — ED Provider Notes (Signed)
[facility] EMERGENCY DEPARTMENT Provider Note  CSN: [id number] Arrival date & time: 10/16/21 5329  Chief Complaint(s) No chief complaint on file.  HPI Kathleen Rodriguez is a 55 y.o. female with a past medical history listed below who presents to the emergency department with 2 days of gradually worsening shortness of breath.  Reports having influenza 1 to 2 weeks ago.  Has attempted to use her home nebulizers and inhalers with minimal relief.  She is having persistent cough.  Shortness of breath is worse with coughing and now worse with activity.  Chest pain only with severe coughing.  No nausea or vomiting.  No abdominal pain.  No other physical complaints.  HPI  Past Medical History Past Medical History:  Diagnosis Date   Arthritis    "knees, lower back; legs, ankles" (01/27/2016)   Asthma    followed by Dr. 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   Diabetes mellitus without complication ([place])    Dyspnea    GERD (gastroesophageal reflux disease)    Hypertension    "doctor took me off RX in 2016" (01/27/2016)   Hypertension    Influenza B April 9242   Complicated by multi-organ failure   OSA on CPAP "since " 03/20/2013   Pneumonia 2016   Required emergent intubation    asthma exacerbation in 2016   Skin ulcer ([place]) secondary to bullous impetigo Marilynn Rail 08/29/2018   Sleep apnea    Tobacco abuse    Upper airway cough syndrome    Patient Active Problem List   Diagnosis Date Noted   Asthma exacerbation 09/25/2021   Diabetes mellitus without complication ([place]) 68/34/1963   OSA (obstructive sleep apnea) 01/20/2021   Essential hypertension 01/20/2021   Obesity (BMI 30-39.9) 01/20/2021   Acute asthma exacerbation 10/09/2019   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 Authorizing Provider  albuterol (PROAIR HFA) 108 (90 Base) MCG/ACT inhaler TAKE 2 PUFFS BY MOUTH EVERY 6 HOURS AS NEEDED FOR WHEEZE OR SHORTNESS OF BREATH Patient taking differently: 2 puffs every 6 (six) hours as needed for wheezing or shortness of breath. 04/02/21  Yes Shawna Clamp, MD  budesonide-formoterol [facility]) 160-4.5 MCG/ACT inhaler Inhale 2 puffs into the lungs 2 (two) times daily. 10/01/21  Yes Mick Sell, PA-C  fluticasone (FLONASE) 50 MCG/ACT nasal spray Place 1 spray into both nostrils daily as needed for allergies. 01/04/21  Yes Chesley Mires, MD  guaiFENesin (MUCINEX) 600 MG 12 hr tablet Take 2 tablets (1,200 mg total) by mouth 2 (two) times daily as needed for to loosen phlegm or cough. 10/01/21  Yes Mick Sell, PA-C  ipratropium-albuterol (DUONEB) 0.5-2.5 (3) MG/3ML SOLN Use 1 vial by nebulization 3 (three) times daily. 10/01/21  Yes Mick Sell, PA-C  loratadine (CLARITIN) 10 MG tablet Take 1 tablet (10 mg total) by  mouth daily. 10/02/21  Yes Andres Labrum D, PA-C  metFORMIN (GLUCOPHAGE) 500 MG tablet TAKE 1 TABLET BY MOUTH 2 TIMES DAILY WITH A MEAL. Patient taking differently: Take 500 mg by mouth 2 (two) times daily with a meal. 05/26/21  Yes Newlin, [name], MD  montelukast (SINGULAIR) 10 MG tablet Take 1 tablet (10 mg total) by mouth at bedtime. 10/01/21  Yes Mick Sell, PA-C  pantoprazole (PROTONIX) 40 MG tablet Take 1 tablet (40 mg total) by mouth daily at 12 noon. Patient taking differently: Take 40 mg by mouth  daily. 10/01/21  Yes Andres Labrum D, PA-C  Accu-Chek Softclix Lancets lancets Use to check blood sugar once daily. 10/05/21   [name], MD  aspirin 81 MG EC tablet Take 1 tablet (81 mg total) by mouth daily. Patient not taking: Reported on 09/26/2021 02/07/19   [name], MD  Blood Glucose Monitoring Suppl (ACCU-CHEK GUIDE ME) w/Device KIT Use to check blood sugar once daily. 05/04/21   [name], MD  cloNIDine (CATAPRES) 0.2 MG tablet Take 1 tablet (0.2 mg total) by mouth 2 (two) times daily. For BP and hot flashes 10/05/21   [name], MD  glucose blood (ACCU-CHEK GUIDE) test strip Use as instructed daily before breakfast. 10/05/21   [name], MD  Misc. Devices MISC Blood pressure monitor  Dx: Hypertension 02/07/19   [name], MD  nicotine (NICODERM CQ - DOSED IN MG/24 HOURS) 14 mg/24hr patch Place 1 patch (14 mg total) onto the skin daily. Patient not taking: Reported on 10/16/2021 10/02/21   Mick Sell, PA-C  predniSONE (DELTASONE) 20 MG tablet Take 2 tablets (40 mg total) by mouth daily with breakfast. Patient not taking: Reported on 10/16/2021 10/02/21   Mick Sell, PA-C                                                                                                                                    Past Surgical History Past Surgical History:  Procedure Laterality Date   BREAST SURGERY Right 1980   as a teenager , cyst was benign   CESAREAN SECTION  2006   LACERATION REPAIR Right ~ 1997   "tried to cut myself"   TOTAL KNEE ARTHROPLASTY Left 09/01/2016   Procedure: LEFT TOTAL KNEE ARTHROPLASTY;  Surgeon: Leandrew Koyanagi, MD;  Location: [place];  Service: Orthopedics;  Laterality: Left;   TUBAL LIGATION  2006   Family History Family History  Problem Relation Age of Onset   Asthma Mother    Heart murmur Mother    Cancer Maternal Grandmother    Heart disease Maternal Grandmother    Hypertension Maternal Grandmother    Cancer Paternal Grandmother     Hypertension Mother    HIV/AIDS Father     Social History Social History   Tobacco Use   Smoking status: Former    Packs/day: 0.25    Years: 21.00  Pack years: 5.25    Types: Cigarettes    Start date: 27    Quit date: 01/31/2013    Years since quitting: 8.7   Smokeless tobacco: Never  Vaping Use   Vaping Use: Never used  Substance Use Topics   Drug use: Not Currently    Types: Cocaine, "Crack" cocaine    Comment: 01/27/2016 "clean from smoking crack since 2007"   Allergies Tomato, Latex, and Wool alcohol [lanolin]  Review of Systems Review of Systems All other systems are reviewed and are negative for acute change except as noted in the HPI  Physical Exam Vital Signs  I have reviewed the triage vital signs BP 129/75 (BP Location: Right Arm)    Pulse (!) 105    Temp 99.3 F (37.4 C) (Oral)    Resp 20    Ht '5\' 5"'  (1.651 m)    Wt 105.4 kg    SpO2 99%    BMI 38.67 kg/m   Physical Exam  ED Results and Treatments Labs (all labs ordered are listed, but only abnormal results are displayed) Labs Reviewed  CBC WITH DIFFERENTIAL/PLATELET - Abnormal; Notable for the following components:      Result Value   RDW 16.6 (*)    All other components within normal limits  BASIC METABOLIC PANEL - Abnormal; Notable for the following components:   Glucose, Bld 103 (*)    Calcium 7.9 (*)    All other components within normal limits  RESP PANEL BY RT-PCR (FLU A&B, COVID) ARPGX2                                                                                                                         EKG  EKG Interpretation  Date/Time:  Saturday October 16 2021 03:04:24 EST Ventricular Rate:  111 PR Interval:  112 QRS Duration: 82 QT Interval:  324 QTC Calculation: 440 R Axis:   73 Text Interpretation: Sinus tachycardia Otherwise normal ECG Confirmed by Addison Lank [phone]) on 10/16/2021 6:24:47 AM       Radiology DG Chest 1 View  Result Date: 10/16/2021 CLINICAL  DATA:  Shortness of breath EXAM: CHEST  1 VIEW COMPARISON:  10/01/2021 FINDINGS: The heart size and mediastinal contours are within normal limits. Both lungs are clear. The visualized skeletal structures are unremarkable. IMPRESSION: No active disease. Electronically Signed   By: Inez Catalina M.D.   On: 10/16/2021 04:02    Pertinent labs & imaging results that were available during my care of the patient were reviewed by me and considered in my medical decision making (see MDM for details).  Medications Ordered in ED Medications  albuterol (PROVENTIL) (2.5 MG/3ML) 0.083% nebulizer solution (10 mg/hr Nebulization New Bag/Given 10/16/21 0612)  azithromycin (ZITHROMAX) 500 mg in sodium chloride 0.9 % 250 mL IVPB (has no administration in time range)  magnesium sulfate IVPB 2 g 50 mL (0 g Intravenous Stopped 10/16/21 0710)  methylPREDNISolone sodium succinate (SOLU-MEDROL) 125 mg/2 mL injection 125 mg (  125 mg Intravenous Given 10/16/21 0605)  albuterol (PROVENTIL) (2.5 MG/3ML) 0.083% nebulizer solution 5 mg (5 mg Nebulization Given 10/16/21 0253)  ipratropium (ATROVENT) nebulizer solution 0.5 mg (0.5 mg Nebulization Given 10/16/21 0253)  ipratropium (ATROVENT) nebulizer solution 0.5 mg (0.5 mg Nebulization Given 10/16/21 0613)                                                                                                                                     Procedures .Critical Care Performed by: Fatima Blank, MD Authorized by: Fatima Blank, MD   Critical care provider statement:    Critical care time (minutes):  45   Critical care time was exclusive of:  Separately billable procedures and treating other patients   Critical care was necessary to treat or prevent imminent or life-threatening deterioration of the following conditions:  Respiratory failure   Critical care was time spent personally by me on the following activities:  Development of treatment plan with patient or  surrogate, discussions with consultants, evaluation of patient's response to treatment, examination of patient, obtaining history from patient or surrogate, review of old charts, re-evaluation of patient's condition, pulse oximetry, ordering and review of radiographic studies, ordering and review of laboratory studies and ordering and performing treatments and interventions  (including critical care time)  Medical Decision Making / ED Course I have reviewed the nursing notes for this encounter and the patient's prior records (if available in EHR or on provided paperwork).  Kathleen Rodriguez was evaluated in Emergency Department on 10/16/2021 for the symptoms described in the history of present illness. She was evaluated in the context of the global COVID-19 pandemic, which necessitated consideration that the patient might be at risk for infection with the SARS-CoV-2 virus that causes COVID-19. Institutional protocols and algorithms that pertain to the evaluation of patients at risk for COVID-19 are in a state of rapid change based on information released by regulatory bodies including the CDC and federal and state organizations. These policies and algorithms were followed during the patient's care in the ED.     Consistent with asthma/COPD exacerbation. COVID/influenza negative. Chest x-ray without evidence of pneumonia, pneumothorax or pulmonary edema.. Labs reassuring without leukocytosis or anemia.  No significant electrolyte derangements or renal sufficiency.  Patient received DuoNeb by EMS.  As well as a DuoNeb by triage.  Will give steroids and magnesium along with another breathing treatment. Will also give Cipro for atypical infection.  Pertinent labs & imaging results that were available during my care of the patient were reviewed by me and considered in my medical decision making:  Will need to reassess. Patient care turned over to oncoming provider. Patient case and results discussed  in detail; please see their note for further ED managment.     Final Clinical Impression(s) / ED Diagnoses Final diagnoses:  Moderate persistent asthma with exacerbation     This chart was dictated using  voice recognition software.  Despite best efforts to proofread,  errors can occur which can change the documentation meaning.    Fatima Blank, MD 10/16/21 [phone]

## 2021-10-16 NOTE — ED Notes (Signed)
Placed pt on 2L 02 per [place]. 

## 2021-10-17 DIAGNOSIS — E785 Hyperlipidemia, unspecified: Secondary | ICD-10-CM | POA: Diagnosis not present

## 2021-10-17 DIAGNOSIS — F419 Anxiety disorder, unspecified: Secondary | ICD-10-CM

## 2021-10-17 DIAGNOSIS — R0602 Shortness of breath: Secondary | ICD-10-CM | POA: Diagnosis not present

## 2021-10-17 DIAGNOSIS — Z79899 Other long term (current) drug therapy: Secondary | ICD-10-CM | POA: Diagnosis not present

## 2021-10-17 DIAGNOSIS — Z7984 Long term (current) use of oral hypoglycemic drugs: Secondary | ICD-10-CM | POA: Diagnosis not present

## 2021-10-17 DIAGNOSIS — Z6838 Body mass index (BMI) 38.0-38.9, adult: Secondary | ICD-10-CM | POA: Diagnosis not present

## 2021-10-17 DIAGNOSIS — Z20822 Contact with and (suspected) exposure to covid-19: Secondary | ICD-10-CM | POA: Diagnosis not present

## 2021-10-17 DIAGNOSIS — J4551 Severe persistent asthma with (acute) exacerbation: Secondary | ICD-10-CM | POA: Diagnosis not present

## 2021-10-17 DIAGNOSIS — Z96652 Presence of left artificial knee joint: Secondary | ICD-10-CM | POA: Diagnosis not present

## 2021-10-17 DIAGNOSIS — R000 Tachycardia, unspecified: Secondary | ICD-10-CM | POA: Diagnosis not present

## 2021-10-17 DIAGNOSIS — J4541 Moderate persistent asthma with (acute) exacerbation: Secondary | ICD-10-CM | POA: Diagnosis not present

## 2021-10-17 DIAGNOSIS — Z7982 Long term (current) use of aspirin: Secondary | ICD-10-CM | POA: Diagnosis not present

## 2021-10-17 DIAGNOSIS — Z8249 Family history of ischemic heart disease and other diseases of the circulatory system: Secondary | ICD-10-CM | POA: Diagnosis not present

## 2021-10-17 DIAGNOSIS — K219 Gastro-esophageal reflux disease without esophagitis: Secondary | ICD-10-CM | POA: Diagnosis not present

## 2021-10-17 DIAGNOSIS — Z7951 Long term (current) use of inhaled steroids: Secondary | ICD-10-CM | POA: Diagnosis not present

## 2021-10-17 DIAGNOSIS — G4733 Obstructive sleep apnea (adult) (pediatric): Secondary | ICD-10-CM | POA: Diagnosis not present

## 2021-10-17 DIAGNOSIS — I10 Essential (primary) hypertension: Secondary | ICD-10-CM | POA: Diagnosis not present

## 2021-10-17 DIAGNOSIS — E119 Type 2 diabetes mellitus without complications: Secondary | ICD-10-CM | POA: Diagnosis not present

## 2021-10-17 LAB — MAGNESIUM: Magnesium: 2 mg/dL (ref 1.7–2.4)

## 2021-10-17 LAB — BASIC METABOLIC PANEL
Anion gap: 5 (ref 5–15)
BUN: 13 mg/dL (ref 6–20)
CO2: 27 mmol/L (ref 22–32)
Calcium: 8.3 mg/dL — ABNORMAL LOW (ref 8.9–10.3)
Chloride: 106 mmol/L (ref 98–111)
Creatinine, Ser: 0.76 mg/dL (ref 0.44–1.00)
GFR, Estimated: >60 mL/min (ref >60)
Glucose, Bld: 118 mg/dL — ABNORMAL HIGH (ref 70–99)
Potassium: 4.4 mmol/L (ref 3.5–5.1)
Sodium: 138 mmol/L (ref 135–145)

## 2021-10-17 LAB — GLUCOSE, CAPILLARY
Glucose-Capillary: 88 mg/dL (ref 70–99)
Glucose-Capillary: 123 mg/dL — ABNORMAL HIGH (ref 70–99)
Glucose-Capillary: 199 mg/dL — ABNORMAL HIGH (ref 70–99)
Glucose-Capillary: 166 mg/dL — ABNORMAL HIGH (ref 70–99)

## 2021-10-17 LAB — PHOSPHORUS: Phosphorus: 3.5 mg/dL (ref 2.5–4.6)

## 2021-10-17 MED ORDER — METHYLPREDNISOLONE SODIUM SUCC 125 MG IJ SOLR
90.0000 mg | Freq: Two times a day (BID) | INTRAMUSCULAR | Status: DC
Start: 2021-10-17 — End: 2021-10-18
  Administered 2021-10-17 – 2021-10-18 (×3): 90 mg via INTRAVENOUS
  Filled 2021-10-17 (×3): qty 2

## 2021-10-17 MED ORDER — DOCUSATE SODIUM 100 MG PO CAPS
100.0000 mg | ORAL_CAPSULE | Freq: Two times a day (BID) | ORAL | Status: DC
  Administered 2021-10-17 – 2021-10-19 (×4): 100 mg via ORAL
  Filled 2021-10-17 (×4): qty 1

## 2021-10-17 MED ORDER — ALPRAZOLAM 0.25 MG PO TABS
0.2500 mg | ORAL_TABLET | Freq: Two times a day (BID) | ORAL | Status: DC
  Administered 2021-10-17 – 2021-10-18 (×3): 0.25 mg via ORAL
  Filled 2021-10-17 (×3): qty 1

## 2021-10-17 NOTE — Assessment & Plan Note (Signed)
Improved, no complaints about this

## 2021-10-17 NOTE — Assessment & Plan Note (Addendum)
Felt better with Xanax.  Changed to hydroxyzine.

## 2021-10-17 NOTE — Assessment & Plan Note (Addendum)
Still somewhat dyspneic although better airway exchange.  Still some significant end expiratory wheezing.  Continue nebulizers, slightly decreased steroids.  Have added some cough suppressant as well as nasal saline

## 2021-10-17 NOTE — Progress Notes (Signed)
[facility] Progress Note  Patient: Kathleen Rodriguez    JOA:[id number]  DOA: 10/16/2021    Date of Service: the patient was seen and examined on 10/17/2021  Brief hospital course: 55 year old female with past medical history of hypertension, morbid obesity, severe anxiety, diabetes mellitus and asthma plus obstructive sleep apnea with recent hospitalization from 12/3-12/9 for status asthmaticus secondary to flu complicated by needing Precedex drip for severe anxiety.  Following discharge, patient completed steroid taper and improved, but then starting on 12/21, increased cough and wheezing.  In the emergency room, found to be tachypneic with respiratory rate as high as 30, but no hypoxia and flu and COVID screening is negative.  Patient brought in for further observation to the hospitalist service.  By following day, patient mildly improved although still very much dyspneic, unable to speak in full sentences.  Assessment and Plan: Cardiovascular and Mediastinum Essential hypertension Assessment & Plan Blood pressure stable, continue home medications  Respiratory * Acute asthma exacerbation Assessment & Plan Patient remains quite dyspneic, not able to complete full sentences.  Exam notes significant wheezing throughout.  We will continue in hospital with oxygen support and nebulizers.  Change steroids back to IV.  Digestive GERD (gastroesophageal reflux disease) Assessment & Plan Stable, on PPI  Other Left shoulder pain Assessment & Plan Improved, no complaints about this  Morbid obesity [facility]) Assessment & Plan Patient meets criteria with BMI greater than 35+ comorbidities of diabetes and hypertension  Anxiety Assessment & Plan Still very anxious.  We will try some Xanax twice daily    Body mass index is 38.67 kg/m.        Consultants: None  Procedures: None  Antimicrobials: None  Code Status: Full code   Subjective: Complains of continued shortness of  breath, even with mild exertion  Objective: Vital signs were reviewed and unremarkable. Vitals:   10/17/21 0819 10/17/21 0857  BP: 131/75   Pulse: 86   Resp: 18   Temp: 98.7 F (37.1 C)   SpO2: 99% 99%    Intake/Output Summary (Last 24 hours) at 10/17/2021 1515 Last data filed at 10/16/2021 2100 Gross per 24 hour  Intake 440 ml  Output --  Net 440 ml   Filed Weights   10/16/21 0305  Weight: 105.4 kg   Body mass index is 38.67 kg/m.  Exam:  General: Alert and oriented x3, mild distress from shortness of breath HEENT: Normocephalic and atraumatic, mucous membranes are moist Cardiovascular: Regular rate and rhythm, S1-S2 Respiratory: Significant bilateral end expiratory wheeze Abdomen: Soft, nontender, nondistended, positive bowel sounds Musculoskeletal: No clubbing or cyanosis or edema Skin: No skin breaks, tears or lesions Psychiatry: Appropriate, no evidence of psychoses.  Appears very anxious Neurology: No focal deficits  Data Reviewed: My review of labs, imaging, notes and other tests shows no new significant findings.   Disposition:  Status is: Inpatient  Remains inpatient appropriate because: Needs further stabilization with IV medications for her asthma exacerbation  Family Communication: Declined for me to call family DVT Prophylaxis: Lovenox     Author: Hollice Espy ,MD 10/17/2021 3:15 PM  To reach On-call, see care teams to locate the attending and reach out via [email or web address]. Between 7PM-7AM, please contact night-coverage If you still have difficulty reaching the attending provider, please page the [facility] (Director on Call) for [facility] on amion for assistance.

## 2021-10-17 NOTE — Hospital Course (Addendum)
55 year old female with past medical history of hypertension, morbid obesity, severe anxiety, diabetes mellitus and asthma plus obstructive sleep apnea with recent hospitalization from 12/3-12/9 for status asthmaticus secondary to flu complicated by needing Precedex drip for severe anxiety.  Following discharge, patient completed steroid taper and improved, but then starting on 12/21, increased cough and wheezing.  In the emergency room, found to be tachypneic with respiratory rate as high as 30, but no hypoxia and flu and COVID screening is negative.  Patient brought in for further observation to the hospitalist service.  Over the next couple of days, patient still having significant wheezing and shortness of breath even with mild activity although breathing slightly improved today from previous day.

## 2021-10-17 NOTE — Assessment & Plan Note (Signed)
Patient meets criteria with BMI greater than 35+ comorbidities of diabetes and hypertension ?

## 2021-10-17 NOTE — Assessment & Plan Note (Signed)
Stable, on PPI

## 2021-10-17 NOTE — Assessment & Plan Note (Signed)
Blood pressure stable, continue home medications 

## 2021-10-18 LAB — GLUCOSE, CAPILLARY
Glucose-Capillary: 114 mg/dL — ABNORMAL HIGH (ref 70–99)
Glucose-Capillary: 102 mg/dL — ABNORMAL HIGH (ref 70–99)
Glucose-Capillary: 139 mg/dL — ABNORMAL HIGH (ref 70–99)
Glucose-Capillary: 123 mg/dL — ABNORMAL HIGH (ref 70–99)

## 2021-10-18 LAB — BRAIN NATRIURETIC PEPTIDE: B Natriuretic Peptide: 93.9 pg/mL (ref 0.0–100.0)

## 2021-10-18 MED ORDER — GUAIFENESIN 100 MG/5ML PO LIQD
5.0000 mL | ORAL | Status: DC | PRN
  Administered 2021-10-18: 15:00:00 5 mL via ORAL
  Filled 2021-10-18 (×3): qty 5

## 2021-10-18 MED ORDER — METHYLPREDNISOLONE SODIUM SUCC 40 MG IJ SOLR
40.0000 mg | Freq: Two times a day (BID) | INTRAMUSCULAR | Status: DC
  Administered 2021-10-18 – 2021-10-19 (×2): 40 mg via INTRAVENOUS
  Filled 2021-10-18 (×2): qty 1

## 2021-10-18 MED ORDER — AZITHROMYCIN 250 MG PO TABS
500.0000 mg | ORAL_TABLET | Freq: Every day | ORAL | Status: DC
  Administered 2021-10-19: 09:00:00 500 mg via ORAL
  Filled 2021-10-18: qty 2

## 2021-10-18 MED ORDER — HYDROXYZINE HCL 10 MG PO TABS
10.0000 mg | ORAL_TABLET | Freq: Three times a day (TID) | ORAL | Status: DC | PRN
  Administered 2021-10-19: 12:00:00 10 mg via ORAL
  Filled 2021-10-18: qty 2
  Filled 2021-10-18 (×4): qty 1

## 2021-10-18 MED ORDER — IPRATROPIUM-ALBUTEROL 0.5-2.5 (3) MG/3ML IN SOLN
3.0000 mL | Freq: Four times a day (QID) | RESPIRATORY_TRACT | Status: DC
  Administered 2021-10-18 – 2021-10-19 (×4): 3 mL via RESPIRATORY_TRACT
  Filled 2021-10-18 (×4): qty 3

## 2021-10-18 MED ORDER — SALINE SPRAY 0.65 % NA SOLN
1.0000 | NASAL | Status: DC | PRN
  Administered 2021-10-18: 15:00:00 1 via NASAL
  Filled 2021-10-18: qty 44

## 2021-10-18 NOTE — Plan of Care (Signed)

## 2021-10-18 NOTE — Progress Notes (Signed)
Mobility Specialist Progress Note   10/18/21 1600  Oxygen Therapy  SpO2 93 %  O2 Device Nasal Cannula  O2 Flow Rate (L/min) 1 L/min  Mobility  Activity Ambulated in hall  Level of Assistance Standby assist, set-up cues, supervision of patient - no hands on  Assistive Device None  Distance Ambulated (ft) 175 ft  Mobility Ambulated with assistance in hallway  Mobility Response Tolerated well  Mobility performed by Mobility specialist  $Mobility charge 1 Mobility   Received pt in bed having no complaints and agreeable to mobility. Asymptomatic throughout ambulation, returned back to bed w/ call bell by side and all needs met.  Pre Mobility: 98 HR, 93% SpO2 on RA During Mobility: 108 HR, 91% SpO2 on RA   Post Mobility: 94 HR, 95% SpO2  Holland Falling Mobility Specialist Phone Number [phone]

## 2021-10-18 NOTE — Progress Notes (Addendum)
SATURATION QUALIFICATIONS: (This note is used to comply with regulatory documentation for home oxygen)  Patient Saturations on Room Air at Rest = 92%  Patient Saturations on Room Air while Ambulating = 91%  Patient Saturations on 1 Liters of oxygen while Ambulating = 95%  Please briefly explain why patient needs home oxygen: N/A

## 2021-10-18 NOTE — TOC Transition Note (Addendum)
Transition of Care [facility]) - CM/SW Discharge Note   Patient Details  Name: YOLANDA HUFFSTETLER MRN: [id number] Date of Birth: 26-May-1966  Transition of Care [facility]) CM/SW Contact:  Kingsley Plan, RN Phone Number: 10/18/2021, 10:39 AM   Clinical Narrative:      Transition of Care [facility]) Screening Note   Patient Details  Name: NICOLA HEINEMANN Date of Birth: 1966-03-03   Transition of Care [facility]) CM/SW Contact:    Kingsley Plan, RN Phone Number: 10/18/2021, 10:39 AM  If home oxygen needed, will need order and oxygen saturation ambulation note, and call TOC .   Transition of Care Department [facility]) has reviewed patient and no TOC needs have been identified at this time. We will continue to monitor patient advancement through interdisciplinary progression rounds. If new patient transition needs arise, please place a TOC consult.          Patient Goals and CMS Choice        Discharge Placement                       Discharge Plan and Services                                     Social Determinants of Health (SDOH) Interventions     Readmission Risk Interventions No flowsheet data found.

## 2021-10-18 NOTE — Assessment & Plan Note (Signed)
CBGs stable even despite steroids

## 2021-10-18 NOTE — Progress Notes (Signed)
[facility] Progress Note  Patient: Kathleen Rodriguez    FOY:[id number]  DOA: 10/16/2021    Date of Service: the patient was seen and examined on 10/18/2021  Brief hospital course: 55 year old female with past medical history of hypertension, morbid obesity, severe anxiety, diabetes mellitus and asthma plus obstructive sleep apnea with recent hospitalization from 12/3-12/9 for status asthmaticus secondary to flu complicated by needing Precedex drip for severe anxiety.  Following discharge, patient completed steroid taper and improved, but then starting on 12/21, increased cough and wheezing.  In the emergency room, found to be tachypneic with respiratory rate as high as 30, but no hypoxia and flu and COVID screening is negative.  Patient brought in for further observation to the hospitalist service.  Over the next couple of days, patient still having significant wheezing and shortness of breath even with mild activity although breathing slightly improved today from previous day.  Assessment and Plan: Cardiovascular and Mediastinum Essential hypertension Assessment & Plan Blood pressure stable, continue home medications  Respiratory * Acute asthma exacerbation Assessment & Plan Still somewhat dyspneic although better airway exchange.  Still some significant end expiratory wheezing.  Continue nebulizers, slightly decreased steroids.  Have added some cough suppressant as well as nasal saline  Digestive GERD (gastroesophageal reflux disease) Assessment & Plan Stable, on PPI  Endocrine Diabetes mellitus without complication (HCC) Assessment & Plan CBGs stable even despite steroids  Other Left shoulder pain Assessment & Plan Improved, no complaints about this  Morbid obesity [facility]) Assessment & Plan Patient meets criteria with BMI greater than 35+ comorbidities of diabetes and hypertension  Anxiety Assessment & Plan Felt better with Xanax.  Changed to hydroxyzine.    Body  mass index is 38.67 kg/m.        Consultants: None  Procedures: None  Antimicrobials: None  Code Status: Full code   Subjective: Feeling better although continues to complain of cough which she cannot get productive phlegm  Objective: Vital signs were reviewed and unremarkable. Vitals:   10/18/21 0828 10/18/21 1209  BP:    Pulse: 77 79  Resp: 20 16  Temp:    SpO2: 98% 98%    Intake/Output Summary (Last 24 hours) at 10/18/2021 1448 Last data filed at 10/18/2021 0600 Gross per 24 hour  Intake 810 ml  Output --  Net 810 ml    Filed Weights   10/16/21 0305  Weight: 105.4 kg   Body mass index is 38.67 kg/m.  Exam:  General: Alert and oriented x3, no acute distress HEENT: Normocephalic and atraumatic, mucous membranes are moist Cardiovascular: Regular rate and rhythm, S1-S2 Respiratory: Significant bilateral end expiratory wheeze, better airway exchange Abdomen: Soft, nontender, nondistended, positive bowel sounds Musculoskeletal: No clubbing or cyanosis or edema Skin: No skin breaks, tears or lesions Psychiatry: Appropriate, no evidence of psychoses.  Appears very anxious Neurology: No focal deficits  Data Reviewed: My review of labs, imaging, notes and other tests shows no new significant findings.   Disposition:  Status is: Inpatient  Remains inpatient appropriate because: Needs further stabilization with IV medications for her asthma exacerbation  Family Communication: Declined for me to call family DVT Prophylaxis: Lovenox     Author: Hollice Espy ,MD 10/18/2021 2:48 PM  To reach On-call, see care teams to locate the attending and reach out via [email or web address]. Between 7PM-7AM, please contact night-coverage If you still have difficulty reaching the attending provider, please page the [facility] (Director on Call) for [facility] on amion for assistance.

## 2021-10-19 ENCOUNTER — Other Ambulatory Visit (HOSPITAL_COMMUNITY): Payer: Self-pay

## 2021-10-19 DIAGNOSIS — G4733 Obstructive sleep apnea (adult) (pediatric): Secondary | ICD-10-CM

## 2021-10-19 LAB — GLUCOSE, CAPILLARY
Glucose-Capillary: 89 mg/dL (ref 70–99)
Glucose-Capillary: 104 mg/dL — ABNORMAL HIGH (ref 70–99)

## 2021-10-19 MED ORDER — HYDROXYZINE HCL 10 MG PO TABS
10.0000 mg | ORAL_TABLET | Freq: Three times a day (TID) | ORAL | 0 refills | Status: DC | PRN
Start: 2021-10-19 — End: 2022-01-18
  Filled 2021-10-19: qty 30, 10d supply, fill #0

## 2021-10-19 MED ORDER — GUAIFENESIN 100 MG/5ML PO LIQD
5.0000 mL | ORAL | 0 refills | Status: DC | PRN
  Filled 2021-10-19: qty 120, 4d supply, fill #0

## 2021-10-19 MED ORDER — FLUCONAZOLE 150 MG PO TABS
150.0000 mg | ORAL_TABLET | Freq: Every day | ORAL | 0 refills | Status: DC
  Filled 2021-10-19: qty 1, 1d supply, fill #0

## 2021-10-19 MED ORDER — PREDNISONE 10 MG PO TABS
ORAL_TABLET | ORAL | 0 refills | Status: AC
  Filled 2021-10-19: qty 30, 10d supply, fill #0

## 2021-10-19 MED ORDER — FLUCONAZOLE 150 MG PO TABS
150.0000 mg | ORAL_TABLET | Freq: Once | ORAL | Status: AC
  Administered 2021-10-19: 13:00:00 150 mg via ORAL
  Filled 2021-10-19: qty 1

## 2021-10-19 MED ORDER — IPRATROPIUM-ALBUTEROL 0.5-2.5 (3) MG/3ML IN SOLN
3.0000 mL | RESPIRATORY_TRACT | Status: DC
  Administered 2021-10-19: 12:00:00 3 mL via RESPIRATORY_TRACT
  Filled 2021-10-19 (×3): qty 3

## 2021-10-19 NOTE — Progress Notes (Signed)
Patient CPAP set up at patient beside. Patient stated she is able to place on and off when ready. 3L O2 bleed in needed.

## 2021-10-19 NOTE — Discharge Summary (Signed)
Physician Discharge Summary   Patient: Kathleen Rodriguez MRN: [id number] DOB: _[birth year]   Admit date:     10/16/2021  Discharge date: 10/19/21  Discharge Physician: Annita Brod   PCP: [name], MD   Recommendations at discharge: 1.  New medication: Steroid taper x10 days 2. new medication: Diflucan 150 mg p.o. x1 3. new medication: Robitussin-DM as needed 4. new medication: Hydroxyzine 10 mg p.o. 3 times daily as needed  Hospital Course   55 year old female with past medical history of hypertension, morbid obesity, severe anxiety, diabetes mellitus and asthma plus obstructive sleep apnea with recent hospitalization from 12/3-12/9 for status asthmaticus secondary to flu complicated by needing Precedex drip for severe anxiety.  Following discharge, patient completed steroid taper and improved, but then starting on 12/21, increased cough and wheezing.  In the emergency room, found to be tachypneic with respiratory rate as high as 30, but no hypoxia and flu and COVID screening is negative.  Patient brought in for further observation to the hospitalist service.  Over the next couple of days, patient treated with nebulizers, steroids and supplemental oxygen.  By 12/27, patient able to speak in complete sentences and wheezing much improved although still present.  Discharge Diagnoses Cardiovascular and Mediastinum Essential hypertension Assessment & Plan Blood pressure stable, continue home medications  Respiratory * Acute asthma exacerbation Assessment & Plan Initially quite short of breath, unable to speak in complete sentences.  Treated with nebulizers, steroids and supplemental oxygen.  Wheezing improved by 12/27 and patient able to speak in complete sentences and walk to the bathroom with minimal shortness of breath.  Plan to discharge home on continue nebulizers +10-day steroid taper.  Follow-up with her PCP.  Advised patient to stay out of the cold.  Digestive GERD  (gastroesophageal reflux disease) Assessment & Plan Stable, on PPI  Endocrine Diabetes mellitus without complication (HCC) Assessment & Plan CBGs stable even despite steroids  Other Left shoulder pain Assessment & Plan Improved, no complaints about this  Morbid obesity [facility]) Assessment & Plan Patient meets criteria with BMI greater than 35+ comorbidities of diabetes and hypertension  Anxiety Assessment & Plan Felt better with Xanax.  Changed to hydroxyzine.     Consultants: None Procedures performed: None Disposition: Home Diet recommendation: Regular diet  DISCHARGE MEDICATION: Allergies as of 10/19/2021       Reactions   Tomato Hives, Itching, Other (See Comments)   ALSO REACTS TO KETCHUP   Latex Itching, Rash   Wool Alcohol [lanolin] Itching        Medication List     STOP taking these medications    SM Mucus Relief 600 MG 12 hr tablet Generic drug: guaiFENesin Replaced by: guaiFENesin 100 MG/5ML liquid       TAKE these medications    Accu-Chek Guide Me w/Device Kit Use to check blood sugar once daily.   Accu-Chek Guide test strip Generic drug: glucose blood Use as instructed daily before breakfast.   Accu-Chek Softclix Lancets lancets Use to check blood sugar once daily.   albuterol 108 (90 Base) MCG/ACT inhaler Commonly known as: ProAir HFA TAKE 2 PUFFS BY MOUTH EVERY 6 HOURS AS NEEDED FOR WHEEZE OR SHORTNESS OF BREATH What changed:  how much to take when to take this reasons to take this additional instructions   aspirin 81 MG EC tablet Take 1 tablet (81 mg total) by mouth daily.   cloNIDine 0.2 MG tablet Commonly known as: CATAPRES Take 1 tablet (0.2 mg total) by mouth 2 (  two) times daily. For BP and hot flashes   fluconazole 150 MG tablet Commonly known as: Diflucan Take 1 tablet (150 mg total) by mouth daily.   fluticasone 50 MCG/ACT nasal spray Commonly known as: FLONASE Place 1 spray into both nostrils daily as needed  for allergies.   guaiFENesin 100 MG/5ML liquid Commonly known as: ROBITUSSIN Take 5 mLs by mouth every 4 (four) hours as needed for cough or to loosen phlegm. Replaces: SM Mucus Relief 600 MG 12 hr tablet   hydrOXYzine 10 MG tablet Commonly known as: ATARAX Take 1 tablet (10 mg total) by mouth 3 (three) times daily as needed for anxiety.   ipratropium-albuterol 0.5-2.5 (3) MG/3ML Soln Commonly known as: DUONEB Use 1 vial by nebulization 3 (three) times daily.   loratadine 10 MG tablet Commonly known as: CLARITIN Take 1 tablet (10 mg total) by mouth daily.   metFORMIN 500 MG tablet Commonly known as: GLUCOPHAGE TAKE 1 TABLET BY MOUTH 2 TIMES DAILY WITH A MEAL.   Misc. Devices Misc Blood pressure monitor  Dx: Hypertension   montelukast 10 MG tablet Commonly known as: SINGULAIR Take 1 tablet (10 mg total) by mouth at bedtime.   pantoprazole 40 MG tablet Commonly known as: PROTONIX Take 1 tablet (40 mg total) by mouth daily at 12 noon. What changed: when to take this   predniSONE 10 MG tablet Commonly known as: DELTASONE Take 5 tablets (50 mg total) by mouth daily for 2 days, THEN 4 tablets (40 mg total) daily for 2 days, THEN 3 tablets (30 mg total) daily for 2 days, THEN 2 tablets (20 mg total) daily for 2 days, THEN 1 tablet (10 mg total) daily for 2 days. Start taking on: October 19, 2021   Symbicort 160-4.5 MCG/ACT inhaler Generic drug: budesonide-formoterol Inhale 2 puffs into the lungs 2 (two) times daily.        Follow-up Information     [name], MD. Schedule an appointment as soon as possible for a visit.   Specialty: Family Medicine Contact information: [place] [address] [phone]                 Discharge Exam: Danley Danker Weights   10/16/21 0305  Weight: 105.4 kg   General: Alert and oriented x3, no acute distress Cardiovascular: Regular rate and rhythm, S1-S2 Lungs: Bilateral end expiratory wheeze,  minimal, improved from previous day  Condition at discharge: improving  The results of significant diagnostics from this hospitalization (including imaging, microbiology, ancillary and laboratory) are listed below for reference.   Imaging Studies: DG Chest 1 View  Result Date: 10/16/2021 CLINICAL DATA:  Shortness of breath EXAM: CHEST  1 VIEW COMPARISON:  10/01/2021 FINDINGS: The heart size and mediastinal contours are within normal limits. Both lungs are clear. The visualized skeletal structures are unremarkable. IMPRESSION: No active disease. Electronically Signed   By: Inez Catalina M.D.   On: 10/16/2021 04:02   DG Chest 2 View  Result Date: 09/25/2021 CLINICAL DATA:  Shortness of breath and wheezing. EXAM: CHEST - 2 VIEW COMPARISON:  March 31, 2021 FINDINGS: The heart size and mediastinal contours are within normal limits. No focal consolidation. No pleural effusion. No pneumothorax. Chronic irregularity of the distal right clavicle likely related to prior trauma. IMPRESSION: No active cardiopulmonary disease. Electronically Signed   By: Dahlia Bailiff M.D.   On: 09/25/2021 21:31   DG Chest Port 1 View  Result Date: 10/01/2021 CLINICAL DATA:  Shortness of breath.  History of asthma. EXAM: PORTABLE CHEST 1 VIEW COMPARISON:  Chest XR, 09/28/2021 and 09/27/2021. CT chest, 03/12/2018 and 02/05/2013. FINDINGS: Cardiomediastinal silhouette is within normal limits. Lungs are well inflated. LEFT nipple shadow. No focal consolidation or mass. No pleural effusion or pneumothorax. Chronic RIGHT distal clavicular deformity. No acute displaced fracture. IMPRESSION: No acute cardiopulmonary process. Electronically Signed   By: Michaelle Birks M.D.   On: 10/01/2021 08:58   DG CHEST PORT 1 VIEW  Result Date: 09/28/2021 CLINICAL DATA:  Asthma. EXAM: PORTABLE CHEST 1 VIEW COMPARISON:  09/27/2021 FINDINGS: The cardiac silhouette, mediastinal and hilar contours are within normal limits and stable. The lungs are  clear. No pleural effusions. No pulmonary lesions. The bony thorax is intact. IMPRESSION: No acute cardiopulmonary findings. Electronically Signed   By: Marijo Sanes M.D.   On: 09/28/2021 08:33   DG CHEST PORT 1 VIEW  Result Date: 09/27/2021 CLINICAL DATA:  Shortness of breath. EXAM: PORTABLE CHEST 1 VIEW COMPARISON:  September 25, 2021. FINDINGS: The heart size and mediastinal contours are within normal limits. Both lungs are clear. The visualized skeletal structures are unremarkable. IMPRESSION: No active disease. Electronically Signed   By: Marijo Conception M.D.   On: 09/27/2021 09:09    Microbiology: Results for orders placed or performed during the hospital encounter of 10/16/21  Resp Panel by RT-PCR (Flu A&B, Covid) Nasopharyngeal Swab     Status: None   Collection Time: 10/16/21  3:35 AM   Specimen: Nasopharyngeal Swab; Nasopharyngeal(NP) swabs in vial transport medium  Result Value Ref Range Status   SARS Coronavirus 2 by RT PCR NEGATIVE NEGATIVE Final    Comment: (NOTE) SARS-CoV-2 target nucleic acids are NOT DETECTED.  The SARS-CoV-2 RNA is generally detectable in upper respiratory specimens during the acute phase of infection. The lowest concentration of SARS-CoV-2 viral copies this assay can detect is 138 copies/mL. A negative result does not preclude SARS-Cov-2 infection and should not be used as the sole basis for treatment or other patient management decisions. A negative result may occur with  improper specimen collection/handling, submission of specimen other than nasopharyngeal swab, presence of viral mutation(s) within the areas targeted by this assay, and inadequate number of viral copies(<138 copies/mL). A negative result must be combined with clinical observations, patient history, and epidemiological information. The expected result is Negative.  Fact Sheet for Patients:  EntrepreneurPulse.com.au  Fact Sheet for Healthcare Providers:   IncredibleEmployment.be  This test is no t yet approved or cleared by the [place] FDA and  has been authorized for detection and/or diagnosis of SARS-CoV-2 by FDA under an Emergency Use Authorization (EUA). This EUA will remain  in effect (meaning this test can be used) for the duration of the COVID-19 declaration under Section 564(b)(1) of the Act, 21 U.S.C.section 360bbb-3(b)(1), unless the authorization is terminated  or revoked sooner.       Influenza A by PCR NEGATIVE NEGATIVE Final   Influenza B by PCR NEGATIVE NEGATIVE Final    Comment: (NOTE) The Xpert Xpress SARS-CoV-2/FLU/RSV plus assay is intended as an aid in the diagnosis of influenza from Nasopharyngeal swab specimens and should not be used as a sole basis for treatment. Nasal washings and aspirates are unacceptable for Xpert Xpress SARS-CoV-2/FLU/RSV testing.  Fact Sheet for Patients: EntrepreneurPulse.com.au  Fact Sheet for Healthcare Providers: IncredibleEmployment.be  This test is not yet approved or cleared by the [place] FDA and has been authorized for detection and/or diagnosis of SARS-CoV-2 by FDA under an Emergency Use  Authorization (EUA). This EUA will remain in effect (meaning this test can be used) for the duration of the COVID-19 declaration under Section 564(b)(1) of the Act, 21 U.S.C. section 360bbb-3(b)(1), unless the authorization is terminated or revoked.  Performed at [facility], [place] [address] [place], [place] [address]     Labs: CBC: Recent Labs  Lab 10/16/21 0257  WBC 5.2  NEUTROABS 2.9  HGB 13.4  HCT 43.1  MCV 88.3  PLT 654   Basic Metabolic Panel: Recent Labs  Lab 10/16/21 0257 10/17/21 0949  NA 139 138  K 3.9 4.4  CL 106 106  CO2 26 27  GLUCOSE 103* 118*  BUN 11 13  CREATININE 0.85 0.76  CALCIUM 7.9* 8.3*  MG  --  2.0  PHOS  --  3.5   Liver Function Tests: No results for input(s): AST,  ALT, ALKPHOS, BILITOT, PROT, ALBUMIN in the last 168 hours. CBG: Recent Labs  Lab 10/18/21 1212 10/18/21 1723 10/18/21 2032 10/19/21 0734 10/19/21 1129  GLUCAP 102* 139* 123* 89 104*    Discharge time spent: less than 30 minutes.  Signed:  Annita Brod MD.  [facility] 10/19/2021

## 2021-10-19 NOTE — Progress Notes (Signed)
Mobility Specialist Progress Note:   10/19/21 1030  Mobility  Activity Ambulated in hall  Level of Assistance Independent  Assistive Device None  Distance Ambulated (ft) 300 ft  Mobility Ambulated independently in hallway  Mobility Response Tolerated well  Mobility performed by Mobility specialist  $Mobility charge 1 Mobility   Pt with SOB during ambulation on 2LO2. SpO2 stayed between 91-94% throughout session. Pt back in bed with all needs met.   Nelta Numbers Mobility Specialist  Phone [phone]

## 2021-10-19 NOTE — Consult Note (Signed)
[facility] Inpatient Consult   10/19/2021  Kathleen Rodriguez 03-04-1966 [id number]  [facility] Care Management [facility] CM)   Patient was reviewed for less than 30 days unplanned readmission and noted high risk score for unplanned readmission. Assessed for potential post hospital chronic disease management needs.  Per review, patient currently active with Managed Medicaid chronic care management team for post hospital follow up.  Plan: Will continue to follow for progression and disposition plans.  Of note, [facility] Care Management services does not replace or interfere with any services that are arranged by inpatient case management or social work.   Christophe Louis, MSN, RN [facility] [employer] [phone]  Toll free office [phone]

## 2021-10-19 NOTE — TOC Transition Note (Addendum)
Transition of Care [facility]) - CM/SW Discharge Note   Patient Details  Name: LEAN JAEGER MRN: [id number] Date of Birth: May 19, 1966  Transition of Care [facility]) CM/SW Contact:  Kingsley Plan, RN Phone Number: 10/19/2021, 2:18 PM   Clinical Narrative:     Patient discharging today and asking for assistance with medication co pay and transportation home. NCM confirmed home address and can assist.   However, patient has medicaid and cannot assist with co pays. Patient states she recently discharged and was co pays were paid by hospital.   TOC note from that discharge states [facility] supervisor approved assistance with co pays . NCM has messaged TOC supervisors and previous NCM , awaiting determination. Patient aware.   TOC supervisor Jiles Crocker did not approve covering co pays. Patient can pay tomorrow. MD offered to cover co pays. NCM will give nurse cab voucher for transportation home.         Patient Goals and CMS Choice        Discharge Placement                       Discharge Plan and Services                                     Social Determinants of Health (SDOH) Interventions     Readmission Risk Interventions No flowsheet data found.

## 2021-10-20 ENCOUNTER — Telehealth: Payer: Self-pay

## 2021-10-20 NOTE — Telephone Encounter (Signed)
Transition Care Management Follow-up Telephone Call Date of discharge and from where: 10/19/2021 from [facility] How have you been since you were released from the hospital? Pt stated that she is still having trouble breathing but it is not as bad as it was. Pt has picked up the prednisone and is doing her breathing treatment. Pt did not have any questions or concerns at this time.   Any questions or concerns? No  Items Reviewed: Did the pt receive and understand the discharge instructions provided? Yes  Medications obtained and verified? Yes  Other? No  Any new allergies since your discharge? No  Dietary orders reviewed? No Do you have support at home? Yes   Functional Questionnaire: (I = Independent and D = Dependent) ADLs: I  Bathing/Dressing- I  Meal Prep- I  Eating- I  Maintaining continence- I  Transferring/Ambulation- I  Managing Meds- I   Follow up appointments reviewed:  PCP Hospital f/u appt confirmed? No  Pt will call PCP for a follow up appt.  Specialist Hospital f/u appt confirmed? No   Are transportation arrangements needed? No  If their condition worsens, is the pt aware to call PCP or go to the Emergency Dept.? Yes Was the patient provided with contact information for the PCP's office or ED? Yes Was to pt encouraged to call back with questions or concerns? Yes

## 2021-10-20 NOTE — Telephone Encounter (Signed)
TOC call has already been completed. This CM placed call to patient # [phone] to schedule a hospital follow up appointment.  Message left with call back requested to this CM.

## 2021-11-09 ENCOUNTER — Other Ambulatory Visit: Payer: Self-pay

## 2021-11-09 ENCOUNTER — Other Ambulatory Visit: Payer: Self-pay | Admitting: Obstetrics and Gynecology

## 2021-11-09 NOTE — Patient Outreach (Signed)
Medicaid Managed Care   Nurse Care Manager Note  11/09/2021 Name:  Kathleen Rodriguez MRN:  [id number] DOB:  [birth year]  Kathleen Rodriguez is an 56 y.o. year old female who is a primary patient of [name], MD.  The [facility] Managed Care Coordination team was consulted for assistance with:    Chronic healthcare management needs.  [name] was given information about Medicaid Managed Care Coordination team services today. Kathleen Rodriguez Patient agreed to services and verbal consent obtained.  Engaged with patient by telephone for follow up visit in response to provider referral for case management and/or care coordination services.   Assessments/Interventions:  Review of past medical history, allergies, medications, health status, including review of consultants reports, laboratory and other test data, was performed as part of comprehensive evaluation and provision of chronic care management services.  SDOH (Social Determinants of Health) assessments and interventions performed: SDOH Interventions    Flowsheet Row Most Recent Value  SDOH Interventions   Financial Strain Interventions Other (Comment)  [referrals made-patient has resources available to her]  Housing Interventions Other (Comment)  [patient has apartment now]       Care Plan  Allergies  Allergen Reactions   Tomato Hives, Itching and Other (See Comments)    ALSO REACTS TO KETCHUP   Latex Itching and Rash   Wool Alcohol [Lanolin] Itching    Medications Reviewed Today     Reviewed by [name], RN (Registered Nurse) on 11/09/21 at 56  Med List Status: <None>   Medication Order Taking? Sig Documenting Provider Last Dose Status Informant  Accu-Chek Softclix Lancets lancets [id number]  Use to check blood sugar once daily. [name], MD  Active Self  albuterol (PROAIR HFA) 108 (90 Base) MCG/ACT inhaler [id number] No TAKE 2 PUFFS BY MOUTH EVERY 6 HOURS AS NEEDED FOR WHEEZE OR SHORTNESS OF BREATH  Patient  taking differently: 2 puffs every 6 (six) hours as needed for wheezing or shortness of breath.   Shawna Clamp, MD 10/15/2021 Active Self  aspirin 81 MG EC tablet [id number] No Take 1 tablet (81 mg total) by mouth daily.  Patient not taking: Reported on 09/26/2021   [name], MD Not Taking Active Self           Med Note (CRAFT, Lorel [place]   Fri Jul 30, 2021 12:47 PM)    Blood Glucose Monitoring Suppl (ACCU-CHEK GUIDE ME) w/Device KIT [id number] No Use to check blood sugar once daily. [name], MD Taking Active Self  budesonide-formoterol [facility]) 160-4.5 MCG/ACT inhaler [id number] No Inhale 2 puffs into the lungs 2 (two) times daily. Mick Sell, PA-C 10/15/2021 Active Self  cloNIDine (CATAPRES) 0.2 MG tablet [id number]  Take 1 tablet (0.2 mg total) by mouth 2 (two) times daily. For BP and hot flashes [name], MD  Active Self           Med Note [place] Crane, MISTY D   Sat Oct 16, 2021  5:41 AM) Pt has not picked up this medication  fluconazole (DIFLUCAN) 150 MG tablet [id number]  Take 1 tablet (150 mg total) by mouth daily. Annita Brod, MD  Active   fluticasone [facility]) 50 MCG/ACT nasal spray [id number] No Place 1 spray into both nostrils daily as needed for allergies. Chesley Mires, MD 10/15/2021 Active Self  glucose blood (ACCU-CHEK GUIDE) test strip [id number]  Use as instructed daily before breakfast. [name], MD  Active Self  guaiFENesin (ROBITUSSIN) 100 MG/5ML liquid [id number]  Take 5 mLs  by mouth every 4 (four) hours as needed for cough or to loosen phlegm. Annita Brod, MD  Active   hydrOXYzine (ATARAX) 10 MG tablet [id number]  Take 1 tablet (10 mg total) by mouth 3 (three) times daily as needed for anxiety. Annita Brod, MD  Active   ipratropium-albuterol (DUONEB) 0.5-2.5 (3) MG/3ML SOLN [id number] No Use 1 vial by nebulization 3 (three) times daily. Mick Sell, PA-C 10/15/2021 Active Self  loratadine (CLARITIN) 10 MG tablet [id number] No Take 1 tablet  (10 mg total) by mouth daily. Andres Labrum D, PA-C 10/15/2021 Active Self  metFORMIN (GLUCOPHAGE) 500 MG tablet [id number] No TAKE 1 TABLET BY MOUTH 2 TIMES DAILY WITH A MEAL.  Patient taking differently: Take 500 mg by mouth 2 (two) times daily with a meal.   [name], MD 10/15/2021 Active Self  Misc. Devices MISC [id number] No Blood pressure monitor  Dx: Hypertension [name], MD Taking Active Self  montelukast (SINGULAIR) 10 MG tablet [id number] No Take 1 tablet (10 mg total) by mouth at bedtime. Mick Sell, PA-C 10/15/2021 Active Self  pantoprazole (PROTONIX) 40 MG tablet [id number] No Take 1 tablet (40 mg total) by mouth daily at 12 noon.  Patient taking differently: Take 40 mg by mouth daily.   Mick Sell, PA-C 10/15/2021 Active Self            Patient Active Problem List   Diagnosis Date Noted   Anxiety 10/17/2021   Asthma with COPD ([place]) 10/16/2021   Hypocalcemia 10/16/2021   Left shoulder pain 10/16/2021   Asthma exacerbation 09/25/2021   Diabetes mellitus without complication ([place]) 68/05/8109   OSA (obstructive sleep apnea) 01/20/2021   Essential hypertension 01/20/2021   Obesity (BMI 30-39.9) 01/20/2021   Acute asthma exacerbation 10/09/2019   COPD mixed type ([place]) 07/04/2019   Asthma 10/22/2017   Osteoarthritis of left knee 09/01/2016   Total knee replacement status 09/01/2016   Osteoarthritis of knees, 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   Tobacco abuse    Primary osteoarthritis of left knee 03/09/2016   Anemia, iron deficiency 08/12/2015   Dysfunctional uterine bleeding 12/28/2014   Morbid obesity ([place]) 05/16/2014   Upper airway cough syndrome 05/01/2014   GERD (gastroesophageal reflux disease) 04/08/2014   OSA (obstructive sleep apnea) 03/20/2013    Conditions to be addressed/monitored per PCP order:   chronic healthcare management needs, HTN, DM, OSA, GERD, asthma, osteoarthritis, anxiety  Care Plan : General Plan of Care (Adult)  Updates made by [name], RN since 11/09/2021 12:00 AM     Problem: Health Promotion or Disease Self-Management (General Plan of Care)   Priority: High  Onset Date: 02/24/2021     Long-Range Goal: Self-Management Plan Developed   Start Date: 02/24/2021  Expected End Date: 02/07/2022  Recent Progress: Not on track  Priority: High  Note:   Current Barriers:  Ineffective Self Health Maintenance-patient hospitalized 10/16/21 to 10/19/21 for asthma exacerbation.  Needs to schedule follow up appointments with PCP and Pulmonology. Patient stable today Knowledge Deficits related to short term plan for care coordination needs and long term plans for chronic disease management needs Nurse Case Manager Clinical Goal(s):  patient will work with care management team to address care coordination and chronic disease management needs related to Disease Management Educational Needs Care Coordination Medication Management and Education Medication Reconciliation Medication Assistance  Psychosocial Support   Interventions:  Evaluation of current treatment plan and patient's adherence to plan as established by provider. Reviewed medications with patient. Collaborated  with pharmacy regarding medications. Discussed plans with patient for ongoing care management follow up and provided patient with direct contact information for care management team Advised patient, providing education and rationale, to monitor blood pressure daily and record, calling provider for findings outside established parameters.  Reviewed scheduled/upcoming provider appointments. 11/09/21:  patient has an appt with PCP 11/24/21 Advised patient, providing education and rationale, to check cbg and record, calling provider for findings outside established parameters.   Care guide referral for financial resources for rent, medicines-07/02/21-completed-patient provided resources. Care Guide referral for transportation. Update 04/12/21:  RNCM provided  transportation resources to patient. Pharmacy referral for medication review. Update 04/12/21:  Patient has met with Pharmacist and continues to follow. Self Care Activities:  Patient will self administer medications as prescribed Patient will attend all scheduled provider appointments Patient will call pharmacy for medication refills Patient will continue to perform ADL's independently Patient will call provider office for new concerns or questions Patient Goals: In the next 30 days, patient will check blood pressure and blood sugars as directed by provider. In the next 30 days, patient will attend all scheduled appointments. In the next 30 days, patient will meet with Pharmacist to discuss medications.- In the next 30 days, patient will schedule follow up appointments. Follow Up Plan: The patient has been provided with contact information for the care management team and has been advised to call with any health related questions or concerns.  The care management team will reach out to the patient again over the next 30 days.     Evidence-based guidance:   Review biopsychosocial determinants of health screens.  Review need for preventive screening based on age, sex, family history and health history.  Determine level of modifiable health risk. .  Discuss identified risks.  Identify areas where behavior change may lead to improved health.  Promote healthy lifestyle.  Evoke change talk using open-ended questions, pros and cons, as well as looking forward.  Identify and manage conditions or preconditions to reduce health risk.  Implement additional goals and interventions based on identified risk factors.     Follow Up:  Patient agrees to Care Plan and Follow-up.  Plan: The Managed Medicaid care management team will reach out to the patient again over the next 30 days. and The  Patient has been provided with contact information for the Managed Medicaid care management team and has been  advised to call with any health related questions or concerns.  Date/time of next scheduled RN care management/care coordination outreach:  12/10/21 at 1030

## 2021-11-09 NOTE — Patient Instructions (Signed)
Hi Ms. Watson you for speaking with me-have a nice day.  Ms. Chaudhari was given information about Medicaid Managed Care team care coordination services as a part of their [place] Medicaid benefit. [name] verbally consented to engagement with the [facility] Managed Care team.   If you are experiencing a medical emergency, please call 911 or report to your local emergency department or urgent care.   If you have a non-emergency medical problem during routine business hours, please contact your provider's office and ask to speak with a nurse.   For questions related to your [facility], please call: [phone] or visit the homepage here: [email or web address]  If you would like to schedule transportation through your [facility], please call the following number at least 2 days in advance of your appointment: [phone].   Call the [facility] at [phone], at any time, 24 hours a day, 7 days a week. If you are in danger or need immediate medical attention call 911.  If you would like help to quit smoking, call 1-800-QUIT-NOW (201)225-0246) OR Espaol: 1-855-Djelo-Ya (3-383-291-9166) o para ms informacin haga clic aqu or Text READY to 200-400 to register via text  Ms. Ronnald Ramp - following are the goals we discussed in your visit today:   Goals Addressed             This Visit's Progress    Protect My Health       Timeframe:  Long-Range Goal Priority:  High Start Date:        02/24/21                     Expected End Date:       ongoing            Follow Up Date: 12/10/21   - schedule appointment for flu shot - schedule appointment for vaccines needed due to my age or health - schedule recommended health tests (blood work, mammogram, colonoscopy, pap test) - schedule and keep appointment for annual check-up   Update 11/09/21:   Patient hospitalized 10/16/21 to 10/19/21 for asthma exacerbation.  Needs PCP and pulmonology follow up.  Why is this important?   Screening tests can find diseases early when they are easier to treat.  Your doctor or nurse will talk with you about which tests are important for you.  Getting shots for common diseases like the flu and shingles will help prevent them.       The patient verbalized understanding of instructions provided today and declined a print copy of patient instruction materials.   The Managed Medicaid care management team will reach out to the patient again over the next 30 days.  The  Patient  has been provided with contact information for the Managed Medicaid care management team and has been advised to call with any health related questions or concerns.   [name] RN, BSN [place] Management Coordinator - Managed Medicaid High Risk [phone]   Following is a copy of your plan of care:  Care Plan : General Plan of Care (Adult)  Updates made by [name], RN since 11/09/2021 12:00 AM     Problem: Health Promotion or Disease Self-Management (General Plan of Care)   Priority: High  Onset Date: 02/24/2021     Long-Range Goal: Self-Management Plan Developed   Start Date: 02/24/2021  Expected End Date: 02/07/2022  Recent Progress: Not on track  Priority: High  Note:   Current Barriers:  Ineffective Self Health Maintenance-patient hospitalized 10/16/21 to 10/19/21 for asthma exacerbation.  Needs to schedule follow up appointments with PCP and Pulmonology. Patient stable today Knowledge Deficits related to short term plan for care coordination needs and long term plans for chronic disease management needs Nurse Case Manager Clinical Goal(s):  patient will work with care management team to address care coordination and chronic disease management needs related to Disease Management Educational Needs Care  Coordination Medication Management and Education Medication Reconciliation Medication Assistance  Psychosocial Support   Interventions:  Evaluation of current treatment plan and patient's adherence to plan as established by provider. Reviewed medications with patient. Collaborated with pharmacy regarding medications. Discussed plans with patient for ongoing care management follow up and provided patient with direct contact information for care management team Advised patient, providing education and rationale, to monitor blood pressure daily and record, calling provider for findings outside established parameters.  Reviewed scheduled/upcoming provider appointments. 11/09/21:  patient has an appt with PCP 11/24/21 Advised patient, providing education and rationale, to check cbg and record, calling provider for findings outside established parameters.   Care guide referral for financial resources for rent, medicines-07/02/21-completed-patient provided resources. Care Guide referral for transportation. Update 04/12/21:  RNCM provided transportation resources to patient. Pharmacy referral for medication review. Update 04/12/21:  Patient has met with Pharmacist and continues to follow. Self Care Activities:  Patient will self administer medications as prescribed Patient will attend all scheduled provider appointments Patient will call pharmacy for medication refills Patient will continue to perform ADL's independently Patient will call provider office for new concerns or questions Patient Goals: In the next 30 days, patient will check blood pressure and blood sugars as directed by provider. In the next 30 days, patient will attend all scheduled appointments. In the next 30 days, patient will meet with Pharmacist to discuss medications.- In the next 30 days, patient will schedule follow up appointments. Follow Up Plan: The patient has been provided with contact information for the care management  team and has been advised to call with any health related questions or concerns.  The care management team will reach out to the patient again over the next 30 days.    Evidence-based guidance:   Review biopsychosocial determinants of health screens.  Review need for preventive screening based on age, sex, family history and health history.  Determine level of modifiable health risk. .  Discuss identified risks.  Identify areas where behavior change may lead to improved health.  Promote healthy lifestyle.  Evoke change talk using open-ended questions, pros and cons, as well as looking forward.  Identify and manage conditions or preconditions to reduce health risk.  Implement additional goals and interventions based on identified risk factors.

## 2021-11-16 ENCOUNTER — Ambulatory Visit: Payer: Self-pay

## 2021-11-24 ENCOUNTER — Ambulatory Visit: Payer: Medicaid Other | Admitting: Physician Assistant

## 2021-11-24 NOTE — Progress Notes (Deleted)
Patient ID: Kathleen Rodriguez, female   DOB: 08/11/1966, 56 y.o.   MRN: [id number]    After hospitalization 12/24-12/27/2022 Hospital Course   56 year old female with past medical history of hypertension, morbid obesity, severe anxiety, diabetes mellitus and asthma plus obstructive sleep apnea with recent hospitalization from 12/3-12/9 for status asthmaticus secondary to flu complicated by needing Precedex drip for severe anxiety.  Following discharge, patient completed steroid taper and improved, but then starting on 12/21, increased cough and wheezing.  In the emergency room, found to be tachypneic with respiratory rate as high as 30, but no hypoxia and flu and COVID screening is negative.  Patient brought in for further observation to the hospitalist service.   Over the next couple of days, patient treated with nebulizers, steroids and supplemental oxygen.  By 12/27, patient able to speak in complete sentences and wheezing much improved although still present.   Discharge Diagnoses Cardiovascular and Mediastinum Essential hypertension Assessment & Plan Blood pressure stable, continue home medications   Respiratory * Acute asthma exacerbation Assessment & Plan Initially quite short of breath, unable to speak in complete sentences.  Treated with nebulizers, steroids and supplemental oxygen.  Wheezing improved by 12/27 and patient able to speak in complete sentences and walk to the bathroom with minimal shortness of breath.  Plan to discharge home on continue nebulizers +10-day steroid taper.  Follow-up with her PCP.  Advised patient to stay out of the cold.   Digestive GERD (gastroesophageal reflux disease) Assessment & Plan Stable, on PPI   Endocrine Diabetes mellitus without complication (HCC) Assessment & Plan CBGs stable even despite steroids   Other Left shoulder pain Assessment & Plan Improved, no complaints about this   Morbid obesity [facility]) Assessment & Plan Patient meets  criteria with BMI greater than 35+ comorbidities of diabetes and hypertension   Anxiety Assessment & Plan Felt better with Xanax.  Changed to hydroxyzine

## 2021-12-10 ENCOUNTER — Other Ambulatory Visit: Payer: Self-pay

## 2021-12-10 ENCOUNTER — Other Ambulatory Visit: Payer: Self-pay | Admitting: Obstetrics and Gynecology

## 2021-12-10 NOTE — Patient Instructions (Signed)
Hi Ms. Doria you for speaking to me today-have a good afternoon and weekend!  Ms. Maule was given information about Medicaid Managed Care team care coordination services as a part of their [place] Medicaid benefit. [name] verbally consented to engagement with the [facility] Managed Care team.   If you are experiencing a medical emergency, please call 911 or report to your local emergency department or urgent care.   If you have a non-emergency medical problem during routine business hours, please contact your provider's office and ask to speak with a nurse.   For questions related to your [facility], please call: [phone] or visit the homepage here: [email or web address]  If you would like to schedule transportation through your [facility], please call the following number at least 2 days in advance of your appointment: [phone].  Rides for urgent appointments can also be made after hours by calling Member Services.  Call the [place] at [phone], at any time, 24 hours a day, 7 days a week. If you are in danger or need immediate medical attention call 911.  If you would like help to quit smoking, call 1-800-QUIT-NOW 971-098-0426) OR Espaol: 1-855-Djelo-Ya (5-859-292-4462) o para ms informacin haga clic aqu or Text READY to 200-400 to register via text  Ms. Kathleen Rodriguez - following are the goals we discussed in your visit today:   Goals Addressed             This Visit's Progress    Protect My Health       Timeframe:  Long-Range Goal Priority:  High Start Date:        02/24/21                     Expected End Date:       ongoing            Follow Up Date: 01/07/22   - schedule appointment for flu shot - schedule appointment for vaccines needed due to my age or health - schedule recommended health tests (blood work,  mammogram, colonoscopy, pap test) - schedule and keep appointment for annual check-up   12/10/21:  Patient to schedule PCP and Pulmonology follow up appointments  Why is this important?   Screening tests can find diseases early when they are easier to treat.  Your doctor or nurse will talk with you about which tests are important for you.  Getting shots for common diseases like the flu and shingles will help prevent them.     Patient verbalizes understanding of instructions and care plan provided today and agrees to view in [place]. Active MyChart status confirmed with patient.    The Managed Medicaid care management team will reach out to the patient again over the next 30 days.  The  Patient  has been provided with contact information for the Managed Medicaid care management team and has been advised to call with any health related questions or concerns.   [name] RN, BSN [place] Management Coordinator - Managed Medicaid High Risk [phone]   Following is a copy of your plan of care:  Care Plan : General Plan of Care (Adult)  Updates made by [name], RN since 12/10/2021 12:00 AM     Problem: Health Promotion or Disease Self-Management (General Plan of Care)   Priority: High  Onset Date: 02/24/2021     Long-Range Goal:  Self-Management Plan Developed   Start Date: 02/24/2021  Expected End Date: 02/07/2022  Recent Progress: Not on track  Priority: High  Note:   Current Barriers:  Ineffective Self Health Maintenance- Needs to schedule follow up appointments with PCP and Pulmonology. Patient stable today, anxiety improved with medications.  To inquire with PCP regarding diabetic shoes-does not check blood sugars all the time and does not check blood pressure Knowledge Deficits related to short term plan for care coordination needs and long term plans for chronic disease management needs Nurse Case Manager Clinical Goal(s):  patient will  work with care management team to address care coordination and chronic disease management needs related to Disease Management Educational Needs Care Coordination Medication Management and Education Medication Reconciliation Medication Assistance  Psychosocial Support   Interventions:  Evaluation of current treatment plan and patient's adherence to plan as established by provider. Reviewed medications with patient. Collaborated with pharmacy regarding medications. Discussed plans with patient for ongoing care management follow up and provided patient with direct contact information for care management team Advised patient, providing education and rationale, to monitor blood pressure daily and record, calling provider for findings outside established parameters.  Reviewed scheduled/upcoming provider appointments. Advised patient, providing education and rationale, to check cbg, BP and record, calling provider for findings outside established parameters.   Care guide referral for financial resources for rent, medicines-07/02/21-completed-patient provided resources. Care Guide referral for transportation. Update 04/12/21:  RNCM provided transportation resources to patient. Pharmacy referral for medication review. Update 04/12/21:  Patient has met with Pharmacist and continues to follow. Self Care Activities:  Patient will self administer medications as prescribed Patient will attend all scheduled provider appointments Patient will call pharmacy for medication refills Patient will continue to perform ADL's independently Patient will call provider office for new concerns or questions Patient to call and schedule PCP, PULM appointments. Patient to follow up with PCP regarding diabetic shoes. Patient Goals: In the next 30 days, patient will check blood pressure and blood sugars as directed by provider. In the next 30 days, patient will attend all scheduled appointments. In the next 30 days,  patient will meet with Pharmacist to discuss medications.- In the next 30 days, patient will schedule follow up appointments. Follow Up Plan: The patient has been provided with contact information for the care management team and has been advised to call with any health related questions or concerns.  The care management team will reach out to the patient again over the next 30 days.

## 2021-12-10 NOTE — Patient Outreach (Signed)
Medicaid Managed Care   Nurse Care Manager Note  12/10/2021 Name:  Kathleen Rodriguez MRN:  [id number] DOB:  01/09/1966  Kathleen Rodriguez is an 56 y.o. year old female who is a primary patient of [name], MD.  The [facility] Managed Care Coordination team was consulted for assistance with:    Chronic healthcare management needs, HTN, DM, OSA, GERD, asthma, anxiety, osteoarthritis  [name] was given information about Medicaid Managed Care Coordination team services today. Kathleen Rodriguez Patient agreed to services and verbal consent obtained.  Engaged with patient by telephone for follow up visit in response to provider referral for case management and/or care coordination services.   Assessments/Interventions:  Review of past medical history, allergies, medications, health status, including review of consultants reports, laboratory and other test data, was performed as part of comprehensive evaluation and provision of chronic care management services.  SDOH (Social Determinants of Health) assessments and interventions performed: SDOH Interventions    Flowsheet Row Most Recent Value  SDOH Interventions   Intimate Partner Violence Interventions Intervention Not Indicated       Care Plan  Allergies  Allergen Reactions   Tomato Hives, Itching and Other (See Comments)    ALSO REACTS TO KETCHUP   Latex Itching and Rash   Wool Alcohol [Lanolin] Itching    Medications Reviewed Today     Reviewed by [name], RN (Registered Nurse) on 12/10/21 at 67  Med List Status: <None>   Medication Order Taking? Sig Documenting Provider Last Dose Status Informant  Accu-Chek Softclix Lancets lancets [id number]  Use to check blood sugar once daily. [name], MD  Active Self  albuterol (PROAIR HFA) 108 (90 Base) MCG/ACT inhaler [id number]  TAKE 2 PUFFS BY MOUTH EVERY 6 HOURS AS NEEDED FOR WHEEZE OR SHORTNESS OF BREATH  Patient taking differently: 2 puffs every 6 (six) hours as needed  for wheezing or shortness of breath.   Shawna Clamp, MD  Active Self  aspirin 81 MG EC tablet [id number]  Take 1 tablet (81 mg total) by mouth daily.  Patient not taking: Reported on 09/26/2021   [name], MD  Active Self           Med Note (Timiya Howells, Lorel [place]   Fri Jul 30, 2021 12:47 PM)    Blood Glucose Monitoring Suppl (ACCU-CHEK GUIDE ME) w/Device KIT [id number]  Use to check blood sugar once daily. [name], MD  Active Self  budesonide-formoterol [facility]) 160-4.5 MCG/ACT inhaler [id number]  Inhale 2 puffs into the lungs 2 (two) times daily. Mick Sell, PA-C  Active Self  cloNIDine (CATAPRES) 0.2 MG tablet [id number] Yes Take 1 tablet (0.2 mg total) by mouth 2 (two) times daily. For BP and hot flashes [name], MD Taking Active Self           Med Note [place] Crane, MISTY D   Sat Oct 16, 2021  5:41 AM) Pt has not picked up this medication  fluconazole (DIFLUCAN) 150 MG tablet [id number]  Take 1 tablet (150 mg total) by mouth daily. Annita Brod, MD  Active   fluticasone [facility]) 50 MCG/ACT nasal spray [id number]  Place 1 spray into both nostrils daily as needed for allergies. Chesley Mires, MD  Active Self  glucose blood (ACCU-CHEK GUIDE) test strip [id number]  Use as instructed daily before breakfast. [name], MD  Active Self  guaiFENesin (ROBITUSSIN) 100 MG/5ML liquid [id number]  Take 5 mLs by mouth every 4 (four) hours as needed for cough or  to loosen phlegm. Annita Brod, MD  Active   hydrOXYzine (ATARAX) 10 MG tablet [id number]  Take 1 tablet (10 mg total) by mouth 3 (three) times daily as needed for anxiety. Annita Brod, MD  Active   ipratropium-albuterol (DUONEB) 0.5-2.5 (3) MG/3ML SOLN [id number]  Use 1 vial by nebulization 3 (three) times daily. Mick Sell, PA-C  Active Self  loratadine (CLARITIN) 10 MG tablet [id number]  Take 1 tablet (10 mg total) by mouth daily. Mick Sell, PA-C  Active Self  metFORMIN (GLUCOPHAGE) 500 MG tablet [id number]  TAKE  1 TABLET BY MOUTH 2 TIMES DAILY WITH A MEAL.  Patient taking differently: Take 500 mg by mouth 2 (two) times daily with a meal.   [name], MD  Active Self  Misc. Devices MISC [id number]  Blood pressure monitor  Dx: Hypertension [name], MD  Active Self  montelukast (SINGULAIR) 10 MG tablet [id number]  Take 1 tablet (10 mg total) by mouth at bedtime. Mick Sell, PA-C  Active Self  pantoprazole (PROTONIX) 40 MG tablet [id number]  Take 1 tablet (40 mg total) by mouth daily at 12 noon.  Patient taking differently: Take 40 mg by mouth daily.   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   Asthma 10/22/2017   Osteoarthritis of left knee 09/01/2016   Total knee replacement status 09/01/2016   Osteoarthritis of knees, 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   Tobacco abuse    Primary osteoarthritis of left knee 03/09/2016   Anemia, iron deficiency 08/12/2015   Dysfunctional uterine bleeding 12/28/2014   Morbid obesity ([place]) 05/16/2014   Upper airway cough syndrome 05/01/2014   GERD (gastroesophageal reflux disease) 04/08/2014   OSA (obstructive sleep apnea) 03/20/2013   Conditions to be addressed/monitored per PCP order:  Chronic healthcare management needs, HTN, DM, OSA, GERD, asthma, anxiety, osteoarthritis  Care Plan : General Plan of Care (Adult)  Updates made by [name], RN since 12/10/2021 12:00 AM     Problem: Health Promotion or Disease Self-Management (General Plan of Care)   Priority: High  Onset Date: 02/24/2021      Long-Range Goal: Self-Management Plan Developed   Start Date: 02/24/2021  Expected End Date: 02/07/2022  Recent Progress: Not on track  Priority: High  Note:   Current Barriers:  Ineffective Self Health Maintenance- Needs to schedule follow up appointments with PCP and Pulmonology. Patient stable today, anxiety improved with medications.  To inquire with PCP regarding diabetic shoes-does not check blood sugars all the time and does not check blood pressure Knowledge Deficits related to short term plan for care coordination needs and long term plans for chronic disease management needs Nurse Case Manager Clinical Goal(s):  patient will work with care management team to address care coordination and chronic disease management needs related to Disease Management Educational Needs Care Coordination Medication Management and Education Medication Reconciliation Medication Assistance  Psychosocial Support   Interventions:  Evaluation of current treatment plan and patient's adherence to plan as established by provider. Reviewed medications  with patient. Collaborated with pharmacy regarding medications. Discussed plans with patient for ongoing care management follow up and provided patient with direct contact information for care management team Advised patient, providing education and rationale, to monitor blood pressure daily and record, calling provider for findings outside established parameters.  Reviewed scheduled/upcoming provider appointments. Advised patient, providing education and rationale, to check cbg, BP and record, calling provider for findings outside established parameters.   Care guide referral for financial resources for rent, medicines-07/02/21-completed-patient provided resources. Care Guide referral for transportation. Update 04/12/21:  RNCM provided transportation resources to patient. Pharmacy referral for medication review. Update 04/12/21:  Patient has met with Pharmacist  and continues to follow. Self Care Activities:  Patient will self administer medications as prescribed Patient will attend all scheduled provider appointments Patient will call pharmacy for medication refills Patient will continue to perform ADL's independently Patient will call provider office for new concerns or questions Patient to call and schedule PCP, PULM appointments. Patient to follow up with PCP regarding diabetic shoes. Patient Goals: In the next 30 days, patient will check blood pressure and blood sugars as directed by provider. In the next 30 days, patient will attend all scheduled appointments. In the next 30 days, patient will meet with Pharmacist to discuss medications.- In the next 30 days, patient will schedule follow up appointments. Follow Up Plan: The patient has been provided with contact information for the care management team and has been advised to call with any health related questions or concerns.  The care management team will reach out to the patient again over the next 30 days.     Follow Up:  Patient agrees to Care Plan and Follow-up.  Plan: The Managed Medicaid care management team will reach out to the patient again over the next 30 days. and The  Patient has been provided with contact information for the Managed Medicaid care management team and has been advised to call with any health related questions or concerns.  Date/time of next scheduled RN care management/care coordination outreach: 01/07/22 at 1030.

## 2021-12-14 ENCOUNTER — Ambulatory Visit: Payer: Self-pay

## 2021-12-14 NOTE — Telephone Encounter (Signed)
°  Chief Complaint: eye swelling - drainage Symptoms: eye very swollen Frequency: since this morning Pertinent Negatives: Patient denies pai Disposition: [] ED /[] Urgent Care (no appt availability in office) / [] Appointment(In office/virtual)/ []  [place] [facility]/ [] Home Care/ [] Refused Recommended Disposition /[] [place] [facility]/ [x]  Follow-up with PCP Additional Notes: Pt has no transportation. Office will call to arrange virtual appointment today. S/w [place].  Reason for Disposition  MODERATE-SEVERE eyelid swelling on one side  (Exception: due to a mosquito bite)  Answer Assessment - Initial Assessment Questions 1. ONSET: "When did the swelling start?" (e.g., minutes, hours, days)     Overnight 2. LOCATION: "What part of the eyelids is swollen?"     Right eye 3. SEVERITY: "How swollen is it?"     very 4. ITCHING: "Is there any itching?" If Yes, ask: "How much?"   (Scale 1-10; mild, moderate or severe)     no 5. PAIN: "Is the swelling painful to touch?" If Yes, ask: "How painful is it?"   (Scale 1-10; mild, moderate or severe)     no 6. FEVER: "Do you have a fever?" If Yes, ask: "What is it, how was it measured, and when did it start?"      no 7. CAUSE: "What do you think is causing the swelling?"     Unknown 8. RECURRENT SYMPTOM: "Have you had eyelid swelling before?" If Yes, ask: "When was the last time?" "What happened that time?"     no 9. OTHER SYMPTOMS: "Do you have any other symptoms?" (e.g., blurred vision, eye discharge, rash, runny nose)     Nose running on that side. Eye draining 10. PREGNANCY: "Is there any chance you are pregnant?" "When was your last menstrual period?"       na  Protocols used: Eye - Swelling-A-AH

## 2022-01-03 ENCOUNTER — Telehealth: Payer: Self-pay

## 2022-01-03 NOTE — Telephone Encounter (Signed)
Patient contacted to schedule mammogram.  ? ?LVM for pt to call back as soon as possible.  ? ? ?RE: Mobile Mammo event located at: ? ?[facility]  [facility]  ?      ?[address]    ?[place]  [id number]    ? ?Date: April 7th  ? ?

## 2022-01-05 ENCOUNTER — Telehealth: Payer: Self-pay | Admitting: Family Medicine

## 2022-01-05 NOTE — Telephone Encounter (Signed)
Copied from CRM [id number]. Topic: General - Other >> Jan 05, 2022 12:54 PM Jaquita Rector A wrote: Reason for CRM: Patient called in to inform Dr Alvis Lemmings that she would like refill on all her medication until her appointment on 01/27/22 asking for a call back from Dr Earley Abide nurse please at Ph#  [phone]

## 2022-01-06 NOTE — Telephone Encounter (Signed)
I have sent refill to her pharmacy. She has not had a visit with me since 02/2021 and her med list is confusing with meds from other Providers; I am not sure what is long term or not. Asthma is managed by pulmonary but I see she has 12 refills previously sent. ?

## 2022-01-06 NOTE — Telephone Encounter (Signed)
Routing to PCP for review.

## 2022-01-07 ENCOUNTER — Telehealth: Payer: Self-pay

## 2022-01-07 ENCOUNTER — Other Ambulatory Visit: Payer: Self-pay

## 2022-01-07 ENCOUNTER — Ambulatory Visit: Payer: Self-pay | Admitting: *Deleted

## 2022-01-07 ENCOUNTER — Telehealth: Payer: Self-pay | Admitting: Pharmacist

## 2022-01-07 ENCOUNTER — Other Ambulatory Visit: Payer: Self-pay | Admitting: Obstetrics and Gynecology

## 2022-01-07 DIAGNOSIS — R7303 Prediabetes: Secondary | ICD-10-CM

## 2022-01-07 MED ORDER — METFORMIN HCL 500 MG PO TABS: 500.0000 mg | ORAL_TABLET | Freq: Two times a day (BID) | ORAL | 0 refills | Status: DC

## 2022-01-07 MED ORDER — GABAPENTIN 300 MG PO CAPS: 300.0000 mg | ORAL_CAPSULE | Freq: Two times a day (BID) | ORAL | 0 refills | Status: DC

## 2022-01-07 NOTE — Telephone Encounter (Signed)
Done

## 2022-01-07 NOTE — Telephone Encounter (Addendum)
Duplicate message. 

## 2022-01-07 NOTE — Telephone Encounter (Signed)
Pt has been given an earlier appointment with PCP . ?

## 2022-01-07 NOTE — Patient Instructions (Signed)
HI Ms. Curtice for speaking with me-have a good rest of your day. ? ?Ms. Kathleen Rodriguez was given information about Medicaid Managed Care team care coordination services as a part of their [place] Medicaid benefit. [name] verbally consented to engagement with the [facility] Managed Care team.  ? ?If you are experiencing a medical emergency, please call 911 or report to your local emergency department or urgent care.  ? ?If you have a non-emergency medical problem during routine business hours, please contact your provider's office and ask to speak with a nurse.  ? ?For questions related to your [facility], please call: [phone] or visit the homepage here: [email or web address] ? ?If you would like to schedule transportation through your [facility], please call the following number at least 2 days in advance of your appointment: [phone]. ? Rides for urgent appointments can also be made after hours by calling Member Services. ? ?Call the [place] at [phone], at any time, 24 hours a day, 7 days a week. If you are in danger or need immediate medical attention call 911. ? ?If you would like help to quit smoking, call 1-800-QUIT-NOW 2102761032) OR Espa?ol: 1-855-D?jelo-Ya 760-501-5424) o para m?s informaci?n haga clic aqu? or Text READY to 200-400 to register via text ? ?Kathleen Rodriguez - following are the goals we discussed in your visit today:  ? Goals Addressed   ? ?  ?  ?  ?  ? This Visit's Progress  ?  Protect My Health     ?  Timeframe:  Long-Range Goal ?Priority:  High ?Start Date:        02/24/21                     ?Expected End Date:       ongoing           ? ?Follow Up Date: 02/07/22 ?  ?- schedule appointment for flu shot ?- schedule appointment for vaccines needed due to my age or health ?- schedule recommended health tests (blood work, mammogram,  colonoscopy, pap test) ?- schedule and keep appointment for annual check-up  ? ?01/07/22:  Patient has scheduled appointments with PULM 01/11/22 and PCP 01/27/22 ? ?Why is this important?   ?Screening tests can find diseases early when they are easier to treat.  ?Your doctor or nurse will talk with you about which tests are important for you.  ?Getting shots for common diseases like the flu and shingles will help prevent them.    ? ?Patient verbalizes understanding of instructions and care plan provided today and agrees to view in [place]. Active MyChart status confirmed with patient.   ? ?The Managed Medicaid care management team will reach out to the patient again over the next 30 days.  ?The  Patient  has been provided with contact information for the Managed Medicaid care management team and has been advised to call with any health related questions or concerns.  ? ?Kathleen Raider RN, BSN ?[facility] ?Care Management Coordinator - Managed Medicaid High Risk ?[phone] ?  ?Following is a copy of your plan of care:  ?Care Plan : General Plan of Care (Adult)  ?Updates made by Kathleen Medicus, RN since 01/07/2022 12:00 AM  ?  ? ?Problem: Health Promotion or Disease Self-Management (General Plan of Care)   ?Priority: High  ?Onset Date: 02/24/2021  ?  ? ?Long-Range Goal: Self-Management  Plan Developed   ?Start Date: 02/24/2021  ?Expected End Date: 02/07/2022  ?Recent Progress: Not on track  ?Priority: High  ?Note:   ?Current Barriers:  ?Ineffective Self Health Maintenance ?Knowledge Deficits related to short term plan for care coordination needs and long term plans for chronic disease management needs ?01/07/22:  Patient has scheduled appointments with PULM and PCP. States she is out of her medications-patient to contact PCP and Pharmacy today.  She continues therapy sessions.  Breathing controlled with inhalers.  ?Nurse Case Manager Clinical Goal(s):  ?patient will work with care management team to  address care coordination and chronic disease management needs related to Disease Management ?Educational Needs ?Care Coordination ?Medication Management and Education ?Medication Reconciliation ?Medication Assistance  ?Psychosocial Support   ?Interventions:  ?Evaluation of current treatment plan and patient's adherence to plan as established by provider. ?Reviewed medications with patient. ?Collaborated with pharmacy regarding medications. ?Discussed plans with patient for ongoing care management follow up and provided patient with direct contact information for care management team ?Advised patient, providing education and rationale, to monitor blood pressure daily and record, calling provider for findings outside established parameters.  ?Reviewed scheduled/upcoming provider appointments. ?Advised patient, providing education and rationale, to check cbg, BP and record, calling provider for findings outside established parameters.   ?Care guide referral for financial resources for rent, medicines-07/02/21-completed-patient provided resources. ?Care Guide referral for transportation. ?Update 04/12/21:  RNCM provided transportation resources to patient. ?Pharmacy referral for medication review. ?Update 04/12/21:  Patient has met with Pharmacist and continues to follow. ?Self Care Activities:  ?Patient will self administer medications as prescribed ?Patient will attend all scheduled provider appointments ?Patient will call pharmacy for medication refills ?Patient will continue to perform ADL's independently ?Patient will call provider office for new concerns or questions ?Patient to follow up with PCP regarding diabetic shoes. ?Patient Goals: ?In the next 30 days, patient will check blood pressure and blood sugars as directed by provider. ?In the next 30 days, patient will attend all scheduled appointments. ?In the next 30 days, patient will meet with Pharmacist to discuss medications.- ?In the next 30 days, patient will  schedule follow up appointments. ?Follow Up Plan: The patient has been provided with contact information for the care management team and has been advised to call with any health related questions or concerns.  ?The care management team will reach out to the patient again over the next 30 days.    ?  ?

## 2022-01-07 NOTE — Telephone Encounter (Signed)
Received request to fill gabapentin. Pt requests this to be sent to [facility].  ?

## 2022-01-07 NOTE — Telephone Encounter (Signed)
Pt has been scheduled an appointment for 01/19/2022 and she is requesting refills on metformin she is completely out and she is also requesting refill on Gabapentin but I don't see it on her current med list. ?

## 2022-01-07 NOTE — Telephone Encounter (Signed)
?  Chief Complaint: requesting medications to be refilled.  ?Symptoms: left heel cracked and bleeds when walking, swelling and pain reported in legs due to not taking her medication gabapentin.  ?Frequency: na ?Pertinent Negatives: Patient denies active bleeding on feet or heels. ?Disposition: [] ED /[] Urgent Care (no appt availability in office) / [x] Appointment(In office/virtual)/ []  [facility]/ [] Home Care/ [] Refused Recommended Disposition /[] [facility]/ []  Follow-up with PCP ?Additional Notes:  ? ?Appt already scheduled for 01/19/22. Gabapentin not on current med list and patient reports PCP prescribed medication . Please advise .  ? ? Reason for Disposition ? [1] Prescription refill request for ESSENTIAL medicine (i.e., likelihood of harm to patient if not taken) AND [2] triager unable to refill per department policy ? ?Answer Assessment - Initial Assessment Questions ?1. DRUG NAME: "What medicine do you need to have refilled?" ?    Metformin and gabapentin ?2. REFILLS REMAINING: "How many refills are remaining?" (Note: The label on the medicine or pill bottle will show how many refills are remaining. If there are no refills remaining, then a renewal may be needed.) ?    Gabapentin not noted on medication list  ?3. EXPIRATION DATE: "What is the expiration date?" (Note: The label states when the prescription will expire, and thus can no longer be refilled.) ?    na ?4. PRESCRIBING HCP: "Who prescribed it?" Reason: If prescribed by specialist, call should be referred to that group. ?    PCP ?5. SYMPTOMS: "Do you have any symptoms?" ?    Left heel cracks and bleeds when walking swelling and pain in feet and legs  ?6. PREGNANCY: "Is there any chance that you are pregnant?" "When was your last menstrual period?" ?    na ? ?Protocols used: Medication Refill and Renewal Call-A-AH ? ?

## 2022-01-07 NOTE — Patient Outreach (Signed)
Medicaid Managed Care   Nurse Care Manager Note  01/07/2022 Name:  Kathleen Rodriguez MRN:  [id number] DOB:  01-02-1966  Kathleen Rodriguez is an 56 y.o. year old female who is a primary patient of Hoy Register, MD.  [facility] Managed Care Coordination team was consulted for assistance with:    Chronic healthcare management needs, HTN, DM, OSA, asthma, anxiety, osteoarthritis, GERD, COPD.  [name] was given information about Medicaid Managed Care Coordination team services today. Kathleen Rodriguez Patient agreed to services and verbal consent obtained.  Engaged with patient by telephone for follow up visit in response to provider referral for case management and/or care coordination services.   Assessments/Interventions:  Review of past medical history, allergies, medications, health status, including review of consultants reports, laboratory and other test data, was performed as part of comprehensive evaluation and provision of chronic care management services.  SDOH (Social Determinants of Health) assessments and interventions performed: SDOH Interventions    Flowsheet Row Most Recent Value  SDOH Interventions   Stress Interventions Other (Comment)  [sees therapist and is on medication]  Transportation Interventions Other (Comment)  [patient now utilizing [facility]]      Care Plan  Allergies  Allergen Reactions   Tomato Hives, Itching and Other (See Comments)    ALSO REACTS TO KETCHUP   Latex Itching and Rash   Wool Alcohol [Lanolin] Itching    Medications Reviewed Today     Reviewed by Danie Chandler, RN (Registered Nurse) on 01/07/22 at 1057  Med List Status: <None>   Medication Order Taking? Sig Documenting Provider Last Dose Status Informant  Accu-Chek Softclix Lancets lancets [id number]  Use to check blood sugar once daily. Hoy Register, MD  Active Self  albuterol (PROAIR HFA) 108 (90 Base) MCG/ACT inhaler [id number] No TAKE 2 PUFFS BY MOUTH EVERY 6  HOURS AS NEEDED FOR WHEEZE OR SHORTNESS OF BREATH  Patient taking differently: 2 puffs every 6 (six) hours as needed for wheezing or shortness of breath.   Cipriano Bunker, MD 10/15/2021 Active Self  aspirin 81 MG EC tablet [id number] No Take 1 tablet (81 mg total) by mouth daily.  Patient not taking: Reported on 09/26/2021   Hoy Register, MD Not Taking Active Self           Med Note (Anguel Delapena, Calvert Cantor   Fri Jul 30, 2021 12:47 PM)    Blood Glucose Monitoring Suppl (ACCU-CHEK GUIDE ME) w/Device KIT [id number] No Use to check blood sugar once daily. Hoy Register, MD Taking Active Self  budesonide-formoterol [facility]) 160-4.5 MCG/ACT inhaler [id number] No Inhale 2 puffs into the lungs 2 (two) times daily. Lidia Collum, PA-C 10/15/2021 Active Self  cloNIDine (CATAPRES) 0.2 MG tablet [id number] No Take 1 tablet (0.2 mg total) by mouth 2 (two) times daily. For BP and hot flashes Hoy Register, MD Taking Active Self           Med Note Margo Aye, MISTY D   Sat Oct 16, 2021  5:41 AM) Pt has not picked up this medication  fluconazole (DIFLUCAN) 150 MG tablet [id number]  Take 1 tablet (150 mg total) by mouth daily. Hollice Espy, MD  Active   fluticasone [facility]) 50 MCG/ACT nasal spray [id number] No Place 1 spray into both nostrils daily as needed for allergies. [name], MD 10/15/2021 Active Self  glucose blood (ACCU-CHEK GUIDE) test strip [id number]  Use as instructed daily before breakfast. Hoy Register, MD  Active Self  guaiFENesin (ROBITUSSIN) 100 MG/5ML  liquid [id number]  Take 5 mLs by mouth every 4 (four) hours as needed for cough or to loosen phlegm. Hollice Espy, MD  Active   hydrOXYzine (ATARAX) 10 MG tablet [id number]  Take 1 tablet (10 mg total) by mouth 3 (three) times daily as needed for anxiety. Hollice Espy, MD  Active   ipratropium-albuterol (DUONEB) 0.5-2.5 (3) MG/3ML SOLN [id number] No Use 1 vial by nebulization 3 (three) times daily. Lidia Collum, PA-C 10/15/2021 Active  Self  loratadine (CLARITIN) 10 MG tablet [id number] No Take 1 tablet (10 mg total) by mouth daily. Pia Mau D, PA-C 10/15/2021 Active Self  metFORMIN (GLUCOPHAGE) 500 MG tablet [id number] No TAKE 1 TABLET BY MOUTH 2 TIMES DAILY WITH A MEAL.  Patient taking differently: Take 500 mg by mouth 2 (two) times daily with a meal.   Hoy Register, MD 10/15/2021 Active Self  Misc. Devices MISC [id number] No Blood pressure monitor  Dx: Hypertension Hoy Register, MD Taking Active Self  montelukast (SINGULAIR) 10 MG tablet [id number] No Take 1 tablet (10 mg total) by mouth at bedtime. Lidia Collum, PA-C 10/15/2021 Active Self  pantoprazole (PROTONIX) 40 MG tablet [id number] No Take 1 tablet (40 mg total) by mouth daily at 12 noon.  Patient taking differently: Take 40 mg by mouth daily.   Lidia Collum, PA-C 10/15/2021 Active Self           Patient Active Problem List   Diagnosis Date Noted   Anxiety 10/17/2021   Asthma with COPD (HCC) 10/16/2021   Hypocalcemia 10/16/2021   Left shoulder pain 10/16/2021   Asthma exacerbation 09/25/2021   Diabetes mellitus without complication (HCC) 01/20/2021   OSA (obstructive sleep apnea) 01/20/2021   Essential hypertension 01/20/2021   Obesity (BMI 30-39.9) 01/20/2021   Acute asthma exacerbation 10/09/2019   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   Asthma 10/22/2017   Osteoarthritis of left knee 09/01/2016   Total knee replacement status 09/01/2016   Osteoarthritis of knees, 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   Tobacco abuse    Primary osteoarthritis of left knee 03/09/2016   Anemia, iron deficiency 08/12/2015   Dysfunctional uterine bleeding 12/28/2014   Morbid obesity (HCC) 05/16/2014   Upper airway cough syndrome 05/01/2014   GERD (gastroesophageal reflux disease) 04/08/2014   OSA (obstructive sleep apnea) 03/20/2013   Conditions to be addressed/monitored per PCP order:  Chronic healthcare management needs, HTN, DM, OSA, asthma, anxiety, osteoarthritis, GERD,  COPD.  Care Plan : General Plan of Care (Adult)  Updates made by Danie Chandler, RN since 01/07/2022 12:00 AM     Problem: Health Promotion or Disease Self-Management (General Plan of Care)   Priority: High  Onset Date: 02/24/2021     Long-Range Goal: Self-Management Plan Developed   Start Date: 02/24/2021  Expected End Date: 02/07/2022  Recent Progress: Not on track  Priority: High  Note:   Current Barriers:  Ineffective Self Health Maintenance Knowledge Deficits related to short term plan for care coordination needs and long term plans for chronic disease management needs 01/07/22:  Patient has scheduled appointments with PULM and PCP. States she is out of her medications-patient to contact PCP and Pharmacy today.  She continues therapy sessions.  Breathing controlled with inhalers.  Nurse Case Manager Clinical Goal(s):  patient will work with care management team to address care coordination and chronic disease management needs related to Disease Management Educational Needs Care Coordination Medication Management and Education Medication Reconciliation Medication Assistance  Psychosocial Support   Interventions:  Evaluation of  current treatment plan and patient's adherence to plan as established by provider. Reviewed medications with patient. Collaborated with pharmacy regarding medications. Discussed plans with patient for ongoing care management follow up and provided patient with direct contact information for care management team Advised patient, providing education and rationale, to monitor blood pressure daily and record, calling provider for findings outside established parameters.  Reviewed scheduled/upcoming provider appointments. Advised patient, providing education and rationale, to check cbg, BP and record, calling provider for findings outside established parameters.   Care guide referral for financial resources for rent, medicines-07/02/21-completed-patient provided  resources. Care Guide referral for transportation. Update 04/12/21:  RNCM provided transportation resources to patient. Pharmacy referral for medication review. Update 04/12/21:  Patient has met with Pharmacist and continues to follow. Self Care Activities:  Patient will self administer medications as prescribed Patient will attend all scheduled provider appointments Patient will call pharmacy for medication refills Patient will continue to perform ADL's independently Patient will call provider office for new concerns or questions Patient to follow up with PCP regarding diabetic shoes. Patient Goals: In the next 30 days, patient will check blood pressure and blood sugars as directed by provider. In the next 30 days, patient will attend all scheduled appointments. In the next 30 days, patient will meet with Pharmacist to discuss medications.- In the next 30 days, patient will schedule follow up appointments. Follow Up Plan: The patient has been provided with contact information for the care management team and has been advised to call with any health related questions or concerns.  The care management team will reach out to the patient again over the next 30 days.     Follow Up:  Patient agrees to Care Plan and Follow-up.  Plan: The Managed Medicaid care management team will reach out to the patient again over the next 30 days. and The  Patient has been provided with contact information for the Managed Medicaid care management team and has been advised to call with any health related questions or concerns.  Date/time of next scheduled RN care management/care coordination outreach:  02/07/22 at 1030.

## 2022-01-11 ENCOUNTER — Ambulatory Visit: Payer: Medicaid Other | Admitting: Nurse Practitioner

## 2022-01-11 NOTE — Progress Notes (Deleted)
? ?'@Patient'  ID: Kathleen Rodriguez, female    DOB: 05-13-1966, 56 y.o.   MRN: [id number] ? ?No chief complaint on file. ? ? ?Referring provider: ?Kathleen Rakes, MD ? ?HPI: ?56 year old female, former smoker followed for allergic asthma, prone to frequent exacerbations, postinfectious related bronchiectasis, OSA. Past medical history significant for polysubstance abuse, HTN, GERD, DM, OA, obesity, IDA, anxiety. Previous allergic reaction to Xolair.  ? ?TEST/EVENTS:  ? ?10/29/2020: OV with [place],NP. Hospitalized in Sept for severe asthma exacerbatio. On Dulera Twice daily - needing an alternative for pharmacy. Previously approved for [name] but hadn't started yet. Remains on Singulair and Claritin daily. Flu shot administered. Has OSA but does not wear CPAP. Switched from [place] to [place].  ? ?Allergies  ?Allergen Reactions  ? Tomato Hives, Itching and Other (See Comments)  ?  ALSO REACTS TO KETCHUP  ? Latex Itching and Rash  ? Wool Alcohol [Lanolin] Itching  ? ? ?Immunization History  ?Administered Date(s) Administered  ? Influenza,inj,Quad PF,6+ Mos 08/13/2013, 07/27/2015, 07/21/2016, 10/22/2017, 08/01/2018, 06/17/2019, 10/29/2020  ? Moderna Sars-Covid-2 Vaccination 12/17/2019, 01/21/2021  ? Pneumococcal Polysaccharide-23 06/17/2019  ? Tdap 08/14/2013  ? Zoster Recombinat (Shingrix) 12/02/2020, 03/18/2021  ? ? ?Past Medical History:  ?Diagnosis Date  ? Arthritis   ? "knees, lower back; legs, ankles" (01/27/2016)  ? Asthma   ? followed by Dr. Halford Rodriguez  ? Asthma   ? CHF (congestive heart failure) ([place]) 2016  ? "when I went into a coma"  ? Cocaine abuse ([place])   ? 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 Diabetes mellitus without complication ([place])   ? Dyspnea   ? GERD (gastroesophageal reflux disease)   ? Hypertension   ? "doctor took me off RX in 2016" (01/27/2016)  ? Hypertension   ? Influenza B April 2014  ? Complicated by multi-organ failure  ? OSA on CPAP "since " 03/20/2013  ? Pneumonia 2016  ? Required emergent intubation    ? asthma exacerbation in 2016  ? Skin ulcer ([place]) secondary to bullous impetigo Kathleen Rodriguez 08/29/2018  ? Sleep apnea   ? Tobacco abuse   ? Upper airway cough syndrome   ? ? ?Tobacco History: ?Social History  ? ?Tobacco Use  ?Smoking Status Former  ? Packs/day: 0.25  ? Years: 21.00  ? Pack years: 5.25  ? Types: Cigarettes  ? Start date: 27  ? Quit date: 01/31/2013  ? Years since quitting: 8.9  ?Smokeless Tobacco Never  ? ?Counseling given: Not Answered ? ? ?Outpatient Medications Prior to Visit  ?Medication Sig Dispense Refill  ? Accu-Chek Softclix Lancets lancets Use to check blood sugar once daily. 100 each 2  ? albuterol (PROAIR HFA) 108 (90 Base) MCG/ACT inhaler TAKE 2 PUFFS BY MOUTH EVERY 6 HOURS AS NEEDED FOR WHEEZE OR SHORTNESS OF BREATH (Patient taking differently: 2 puffs every 6 (six) hours as needed for wheezing or shortness of breath.) 8.5 each 3  ? aspirin 81 MG EC tablet Take 1 tablet (81 mg total) by mouth daily. (Patient not taking: Reported on 09/26/2021) 30 tablet 3  ? Blood Glucose Monitoring Suppl (ACCU-CHEK GUIDE ME) w/Device KIT Use to check blood sugar once daily. 1 kit 0  ? budesonide-formoterol (SYMBICORT) 160-4.5 MCG/ACT inhaler Inhale 2 puffs into the lungs 2 (two) times daily. 10.2 g 12  ? cloNIDine (CATAPRES) 0.2 MG tablet Take 1 tablet (0.2 mg total) by mouth 2 (two) times daily. For BP and hot flashes 60 tablet 2  ? fluconazole (DIFLUCAN) 150 MG  tablet Take 1 tablet (150 mg total) by mouth daily. 1 tablet 0  ? fluticasone (FLONASE) 50 MCG/ACT nasal spray Place 1 spray into both nostrils daily as needed for allergies. 16 g 11  ? gabapentin (NEURONTIN) 300 MG capsule Take 1 capsule (300 mg total) by mouth 2 (two) times daily. 60 capsule 0  ? glucose blood (ACCU-CHEK GUIDE) test strip Use as instructed daily before breakfast. 100 each 2  ? guaiFENesin (ROBITUSSIN) 100 MG/5ML liquid Take 5 mLs by mouth every 4 (four) hours as needed for cough or to loosen phlegm. 120 mL 0  ? hydrOXYzine  (ATARAX) 10 MG tablet Take 1 tablet (10 mg total) by mouth 3 (three) times daily as needed for anxiety. 30 tablet 0  ? ipratropium-albuterol (DUONEB) 0.5-2.5 (3) MG/3ML SOLN Use 1 vial by nebulization 3 (three) times daily. 360 mL 1  ? loratadine (CLARITIN) 10 MG tablet Take 1 tablet (10 mg total) by mouth daily. 30 tablet 0  ? metFORMIN (GLUCOPHAGE) 500 MG tablet Take 1 tablet (500 mg total) by mouth 2 (two) times daily with a meal. 60 tablet 0  ? Misc. Devices MISC Blood pressure monitor  ?Dx: Hypertension 1 each 0  ? montelukast (SINGULAIR) 10 MG tablet Take 1 tablet (10 mg total) by mouth at bedtime. 30 tablet 0  ? pantoprazole (PROTONIX) 40 MG tablet Take 1 tablet (40 mg total) by mouth daily at 12 noon. (Patient taking differently: Take 40 mg by mouth daily.) 30 tablet 0  ? ?No facility-administered medications prior to visit.  ? ? ? ?Review of Systems:  ? ?Constitutional: No weight loss or gain, night sweats, fevers, chills, fatigue, or lassitude. ?HEENT: No headaches, difficulty swallowing, tooth/dental problems, or sore throat. No sneezing, itching, ear ache, nasal congestion, or post nasal drip ?CV:  No chest pain, orthopnea, PND, swelling in lower extremities, anasarca, dizziness, palpitations, syncope ?Resp: No shortness of breath with exertion or at rest. No excess mucus or change in color of mucus. No productive or non-productive. No hemoptysis. No wheezing.  No chest wall deformity ?GI:  No heartburn, indigestion, abdominal pain, nausea, vomiting, diarrhea, change in bowel habits, loss of appetite, bloody stools.  ?GU: No dysuria, change in color of urine, urgency or frequency.  No flank pain, no hematuria  ?Skin: No rash, lesions, ulcerations ?MSK:  No joint pain or swelling.  No decreased range of motion.  No back pain. ?Neuro: No dizziness or lightheadedness.  ?Psych: No depression or anxiety. Mood stable.  ? ? ? ?Physical Exam: ? ?There were no vitals taken for this visit. ? ?GEN: Pleasant,  interactive, well-nourished/chronically-ill appearing/acutely-ill appearing/poorly-nourished/morbidly obese; in no acute distress.****** ?HEENT:  Normocephalic and atraumatic. EACs patent bilaterally. TM pearly gray with present light reflex bilaterally. PERRLA. Sclera white. Nasal turbinates pink, moist and patent bilaterally. No rhinorrhea present. Oropharynx pink and moist, without exudate or edema. No lesions, ulcerations, or postnasal drip.  ?NECK:  Supple w/ fair ROM. No JVD present. Normal carotid impulses w/o bruits. Thyroid symmetrical with no goiter or nodules palpated. No lymphadenopathy.   ?CV: RRR, no m/r/g, no peripheral edema. Pulses intact, +2 bilaterally. No cyanosis, pallor or clubbing. ?PULMONARY:  Unlabored, regular breathing. Clear bilaterally A&P w/o wheezes/rales/rhonchi. No accessory muscle use. No dullness to percussion. ?GI: BS present and normoactive. Soft, non-tender to palpation. No organomegaly or masses detected. No CVA tenderness. ?MSK: No erythema, warmth or tenderness. Cap refil <2 sec all extrem. No deformities or joint swelling noted.  ?Neuro: A/Ox3. No focal  deficits noted.   ?Skin: Warm, no lesions or rashe ?Psych: Normal affect and behavior. Judgement and thought content appropriate.  ? ? ? ?Lab Results: ? ?CBC ?   ?Component Value Date/Time  ? WBC 5.2 10/16/2021 0257  ? RBC 4.88 10/16/2021 0257  ? HGB 13.4 10/16/2021 0257  ? HGB 14.8 12/02/2020 1143  ? HCT 43.1 10/16/2021 0257  ? HCT 46.9 (H) 12/02/2020 1143  ? PLT 235 10/16/2021 0257  ? PLT 318 12/02/2020 1143  ? MCV 88.3 10/16/2021 0257  ? MCV 87 12/02/2020 1143  ? MCH 27.5 10/16/2021 0257  ? MCHC 31.1 10/16/2021 0257  ? RDW 16.6 (H) 10/16/2021 0257  ? RDW 13.6 12/02/2020 1143  ? LYMPHSABS 1.4 10/16/2021 0257  ? LYMPHSABS 2.2 12/02/2020 1143  ? MONOABS 0.7 10/16/2021 0257  ? EOSABS 0.2 10/16/2021 0257  ? EOSABS 0.4 12/02/2020 1143  ? BASOSABS 0.0 10/16/2021 0257  ? BASOSABS 0.1 12/02/2020 1143  ? ? ?BMET ?   ?Component Value  Date/Time  ? NA 138 10/17/2021 0949  ? NA 141 12/02/2020 1143  ? K 4.4 10/17/2021 0949  ? CL 106 10/17/2021 0949  ? CO2 27 10/17/2021 0949  ? GLUCOSE 118 (H) 10/17/2021 0949  ? BUN 13 10/17/2021 0949  ? BUN 14 02

## 2022-01-16 ENCOUNTER — Other Ambulatory Visit: Payer: Self-pay

## 2022-01-16 ENCOUNTER — Encounter (HOSPITAL_COMMUNITY): Payer: Self-pay

## 2022-01-16 ENCOUNTER — Emergency Department (HOSPITAL_COMMUNITY): Payer: Medicaid Other

## 2022-01-16 ENCOUNTER — Inpatient Hospital Stay (HOSPITAL_COMMUNITY)
Admission: EM | Admit: 2022-01-16 | Discharge: 2022-01-18 | DRG: 189 | Disposition: A | Discharge status: Home / Self Care | Payer: Medicaid Other | Attending: Internal Medicine | Admitting: Internal Medicine

## 2022-01-16 DIAGNOSIS — Z96652 Presence of left artificial knee joint: Secondary | ICD-10-CM | POA: Diagnosis present

## 2022-01-16 DIAGNOSIS — I10 Essential (primary) hypertension: Secondary | ICD-10-CM | POA: Diagnosis present

## 2022-01-16 DIAGNOSIS — J45909 Unspecified asthma, uncomplicated: Secondary | ICD-10-CM | POA: Diagnosis not present

## 2022-01-16 DIAGNOSIS — Z20822 Contact with and (suspected) exposure to covid-19: Secondary | ICD-10-CM | POA: Diagnosis present

## 2022-01-16 DIAGNOSIS — Z8701 Personal history of pneumonia (recurrent): Secondary | ICD-10-CM

## 2022-01-16 DIAGNOSIS — Z9851 Tubal ligation status: Secondary | ICD-10-CM | POA: Diagnosis not present

## 2022-01-16 DIAGNOSIS — Z7982 Long term (current) use of aspirin: Secondary | ICD-10-CM

## 2022-01-16 DIAGNOSIS — K219 Gastro-esophageal reflux disease without esophagitis: Secondary | ICD-10-CM | POA: Diagnosis present

## 2022-01-16 DIAGNOSIS — M47819 Spondylosis without myelopathy or radiculopathy, site unspecified: Secondary | ICD-10-CM | POA: Diagnosis present

## 2022-01-16 DIAGNOSIS — F17210 Nicotine dependence, cigarettes, uncomplicated: Secondary | ICD-10-CM | POA: Diagnosis present

## 2022-01-16 DIAGNOSIS — E876 Hypokalemia: Secondary | ICD-10-CM | POA: Diagnosis present

## 2022-01-16 DIAGNOSIS — J9601 Acute respiratory failure with hypoxia: Secondary | ICD-10-CM | POA: Diagnosis not present

## 2022-01-16 DIAGNOSIS — Z91040 Latex allergy status: Secondary | ICD-10-CM

## 2022-01-16 DIAGNOSIS — R0902 Hypoxemia: Secondary | ICD-10-CM | POA: Diagnosis not present

## 2022-01-16 DIAGNOSIS — R062 Wheezing: Secondary | ICD-10-CM | POA: Diagnosis not present

## 2022-01-16 DIAGNOSIS — E1165 Type 2 diabetes mellitus with hyperglycemia: Secondary | ICD-10-CM | POA: Diagnosis present

## 2022-01-16 DIAGNOSIS — R0602 Shortness of breath: Secondary | ICD-10-CM | POA: Diagnosis not present

## 2022-01-16 DIAGNOSIS — Z91048 Other nonmedicinal substance allergy status: Secondary | ICD-10-CM

## 2022-01-16 DIAGNOSIS — G4733 Obstructive sleep apnea (adult) (pediatric): Secondary | ICD-10-CM | POA: Diagnosis present

## 2022-01-16 DIAGNOSIS — Z7951 Long term (current) use of inhaled steroids: Secondary | ICD-10-CM | POA: Diagnosis not present

## 2022-01-16 DIAGNOSIS — Z79899 Other long term (current) drug therapy: Secondary | ICD-10-CM

## 2022-01-16 DIAGNOSIS — M19072 Primary osteoarthritis, left ankle and foot: Secondary | ICD-10-CM | POA: Diagnosis present

## 2022-01-16 DIAGNOSIS — J45901 Unspecified asthma with (acute) exacerbation: Principal | ICD-10-CM

## 2022-01-16 DIAGNOSIS — T380X5A Adverse effect of glucocorticoids and synthetic analogues, initial encounter: Secondary | ICD-10-CM | POA: Diagnosis not present

## 2022-01-16 DIAGNOSIS — J4551 Severe persistent asthma with (acute) exacerbation: Secondary | ICD-10-CM | POA: Diagnosis present

## 2022-01-16 DIAGNOSIS — M19071 Primary osteoarthritis, right ankle and foot: Secondary | ICD-10-CM | POA: Diagnosis present

## 2022-01-16 DIAGNOSIS — E119 Type 2 diabetes mellitus without complications: Secondary | ICD-10-CM | POA: Diagnosis not present

## 2022-01-16 DIAGNOSIS — R7303 Prediabetes: Secondary | ICD-10-CM

## 2022-01-16 DIAGNOSIS — Z8249 Family history of ischemic heart disease and other diseases of the circulatory system: Secondary | ICD-10-CM

## 2022-01-16 DIAGNOSIS — B3731 Acute candidiasis of vulva and vagina: Secondary | ICD-10-CM | POA: Diagnosis not present

## 2022-01-16 DIAGNOSIS — Z7984 Long term (current) use of oral hypoglycemic drugs: Secondary | ICD-10-CM | POA: Diagnosis not present

## 2022-01-16 DIAGNOSIS — Z825 Family history of asthma and other chronic lower respiratory diseases: Secondary | ICD-10-CM

## 2022-01-16 DIAGNOSIS — Z91018 Allergy to other foods: Secondary | ICD-10-CM | POA: Diagnosis not present

## 2022-01-16 DIAGNOSIS — R0689 Other abnormalities of breathing: Secondary | ICD-10-CM | POA: Diagnosis not present

## 2022-01-16 LAB — CBC WITH DIFFERENTIAL/PLATELET
Abs Immature Granulocytes: 0.01 10*3/uL (ref 0.00–0.07)
Basophils Absolute: 0.1 10*3/uL (ref 0.0–0.1)
Basophils Relative: 1 %
Eosinophils Absolute: 0.4 10*3/uL (ref 0.0–0.5)
Eosinophils Relative: 7 %
HCT: 44.6 % (ref 36.0–46.0)
Hemoglobin: 14.8 g/dL (ref 12.0–15.0)
Immature Granulocytes: 0 %
Lymphocytes Relative: 53 %
Lymphs Abs: 3.2 10*3/uL (ref 0.7–4.0)
MCH: 28.7 pg (ref 26.0–34.0)
MCHC: 33.2 g/dL (ref 30.0–36.0)
MCV: 86.6 fL (ref 80.0–100.0)
Monocytes Absolute: 0.4 10*3/uL (ref 0.1–1.0)
Monocytes Relative: 7 %
Neutro Abs: 1.9 10*3/uL (ref 1.7–7.7)
Neutrophils Relative %: 32 %
Platelets: 301 10*3/uL (ref 150–400)
RBC: 5.15 MIL/uL — ABNORMAL HIGH (ref 3.87–5.11)
RDW: 15.4 % (ref 11.5–15.5)
WBC: 6 10*3/uL (ref 4.0–10.5)
nRBC: 0 % (ref 0.0–0.2)

## 2022-01-16 LAB — BASIC METABOLIC PANEL
Anion gap: 10 (ref 5–15)
BUN: 11 mg/dL (ref 6–20)
CO2: 27 mmol/L (ref 22–32)
Calcium: 8.5 mg/dL — ABNORMAL LOW (ref 8.9–10.3)
Chloride: 102 mmol/L (ref 98–111)
Creatinine, Ser: 0.74 mg/dL (ref 0.44–1.00)
GFR, Estimated: >60 mL/min (ref >60)
Glucose, Bld: 106 mg/dL — ABNORMAL HIGH (ref 70–99)
Potassium: 3.4 mmol/L — ABNORMAL LOW (ref 3.5–5.1)
Sodium: 139 mmol/L (ref 135–145)

## 2022-01-16 LAB — RESP PANEL BY RT-PCR (FLU A&B, COVID) ARPGX2
Influenza A by PCR: NEGATIVE
Influenza B by PCR: NEGATIVE
SARS Coronavirus 2 by RT PCR: NEGATIVE

## 2022-01-16 LAB — I-STAT BETA HCG BLOOD, ED (MC, WL, AP ONLY): I-stat hCG, quantitative: <5 m[IU]/mL (ref <5)

## 2022-01-16 LAB — GLUCOSE, CAPILLARY: Glucose-Capillary: 213 mg/dL — ABNORMAL HIGH (ref 70–99)

## 2022-01-16 LAB — MRSA NEXT GEN BY PCR, NASAL: MRSA by PCR Next Gen: NOT DETECTED

## 2022-01-16 MED ORDER — CHLORHEXIDINE GLUCONATE 0.12 % MT SOLN
15.0000 mL | Freq: Two times a day (BID) | OROMUCOSAL | Status: DC
  Administered 2022-01-16 – 2022-01-17 (×2): 15 mL via OROMUCOSAL
  Filled 2022-01-16 (×3): qty 15

## 2022-01-16 MED ORDER — POTASSIUM CHLORIDE 10 MEQ/100ML IV SOLN
10.0000 meq | INTRAVENOUS | Status: DC
  Administered 2022-01-16: 10 meq via INTRAVENOUS
  Filled 2022-01-16: qty 100

## 2022-01-16 MED ORDER — ALBUTEROL SULFATE (2.5 MG/3ML) 0.083% IN NEBU: 10.0000 mg | INHALATION_SOLUTION | Freq: Once | RESPIRATORY_TRACT | Status: DC

## 2022-01-16 MED ORDER — ACETAMINOPHEN 650 MG RE SUPP
650.0000 mg | Freq: Four times a day (QID) | RECTAL | Status: DC | PRN
Start: 2022-01-16 — End: 2022-01-18

## 2022-01-16 MED ORDER — MAGNESIUM SULFATE 2 GM/50ML IV SOLN
2.0000 g | Freq: Once | INTRAVENOUS | Status: AC
  Administered 2022-01-16: 2 g via INTRAVENOUS
  Filled 2022-01-16: qty 50

## 2022-01-16 MED ORDER — ALBUTEROL SULFATE (2.5 MG/3ML) 0.083% IN NEBU
2.5000 mg | INHALATION_SOLUTION | RESPIRATORY_TRACT | Status: DC | PRN
Start: 2022-01-16 — End: 2022-01-18

## 2022-01-16 MED ORDER — HYDROXYZINE HCL 10 MG PO TABS
10.0000 mg | ORAL_TABLET | Freq: Three times a day (TID) | ORAL | Status: DC | PRN
  Administered 2022-01-17: 10 mg via ORAL
  Filled 2022-01-16 (×3): qty 1

## 2022-01-16 MED ORDER — ALBUTEROL (5 MG/ML) CONTINUOUS INHALATION SOLN: 10.0000 mg/h | INHALATION_SOLUTION | Freq: Once | RESPIRATORY_TRACT | Status: DC

## 2022-01-16 MED ORDER — ONDANSETRON HCL 4 MG/2ML IJ SOLN
4.0000 mg | Freq: Four times a day (QID) | INTRAMUSCULAR | Status: DC | PRN
Start: 2022-01-16 — End: 2022-01-18

## 2022-01-16 MED ORDER — ALBUTEROL SULFATE (2.5 MG/3ML) 0.083% IN NEBU
2.5000 mg | INHALATION_SOLUTION | RESPIRATORY_TRACT | Status: DC
  Administered 2022-01-16: 2.5 mg via RESPIRATORY_TRACT
  Filled 2022-01-16 (×2): qty 3

## 2022-01-16 MED ORDER — ASPIRIN 81 MG PO TBEC: 81.0000 mg | DELAYED_RELEASE_TABLET | Freq: Every day | ORAL | Status: DC

## 2022-01-16 MED ORDER — INSULIN DETEMIR 100 UNIT/ML ~~LOC~~ SOLN
5.0000 [IU] | Freq: Two times a day (BID) | SUBCUTANEOUS | Status: DC
Start: 2022-01-16 — End: 2022-01-18
  Administered 2022-01-16 – 2022-01-18 (×4): 5 [IU] via SUBCUTANEOUS
  Filled 2022-01-16 (×6): qty 0.05

## 2022-01-16 MED ORDER — CHLORHEXIDINE GLUCONATE CLOTH 2 % EX PADS
6.0000 | MEDICATED_PAD | Freq: Every day | CUTANEOUS | Status: DC
  Administered 2022-01-16: 6 via TOPICAL

## 2022-01-16 MED ORDER — PANTOPRAZOLE SODIUM 40 MG PO TBEC
40.0000 mg | DELAYED_RELEASE_TABLET | Freq: Every day | ORAL | Status: DC
  Administered 2022-01-17 – 2022-01-18 (×2): 40 mg via ORAL
  Filled 2022-01-16 (×2): qty 1

## 2022-01-16 MED ORDER — METHYLPREDNISOLONE SODIUM SUCC 40 MG IJ SOLR
40.0000 mg | Freq: Two times a day (BID) | INTRAMUSCULAR | Status: DC
  Administered 2022-01-16 – 2022-01-18 (×4): 40 mg via INTRAVENOUS
  Filled 2022-01-16 (×4): qty 1

## 2022-01-16 MED ORDER — IPRATROPIUM BROMIDE 0.02 % IN SOLN
1.0000 mg | Freq: Once | RESPIRATORY_TRACT | Status: AC
  Administered 2022-01-16: 1 mg via RESPIRATORY_TRACT
  Filled 2022-01-16: qty 5

## 2022-01-16 MED ORDER — POLYETHYLENE GLYCOL 3350 17 G PO PACK
17.0000 g | PACK | Freq: Every day | ORAL | Status: AC
  Administered 2022-01-16 – 2022-01-17 (×2): 17 g via ORAL
  Filled 2022-01-16 (×2): qty 1

## 2022-01-16 MED ORDER — HYDRALAZINE HCL 20 MG/ML IJ SOLN: 5.0000 mg | INTRAMUSCULAR | Status: DC | PRN

## 2022-01-16 MED ORDER — GABAPENTIN 300 MG PO CAPS
300.0000 mg | ORAL_CAPSULE | Freq: Two times a day (BID) | ORAL | Status: DC
  Administered 2022-01-16 – 2022-01-18 (×4): 300 mg via ORAL
  Filled 2022-01-16 (×4): qty 1

## 2022-01-16 MED ORDER — ONDANSETRON HCL 4 MG PO TABS: 4.0000 mg | ORAL_TABLET | Freq: Four times a day (QID) | ORAL | Status: DC | PRN

## 2022-01-16 MED ORDER — ACETAMINOPHEN 325 MG PO TABS: 650.0000 mg | ORAL_TABLET | Freq: Four times a day (QID) | ORAL | Status: DC | PRN

## 2022-01-16 MED ORDER — CLONIDINE HCL 0.1 MG PO TABS
0.2000 mg | ORAL_TABLET | Freq: Two times a day (BID) | ORAL | Status: DC
  Administered 2022-01-16 – 2022-01-18 (×4): 0.2 mg via ORAL
  Filled 2022-01-16 (×4): qty 2

## 2022-01-16 MED ORDER — SODIUM CHLORIDE 0.9 % IV SOLN: INTRAVENOUS | Status: AC

## 2022-01-16 MED ORDER — ORAL CARE MOUTH RINSE
15.0000 mL | Freq: Two times a day (BID) | OROMUCOSAL | Status: DC
  Administered 2022-01-17: 15 mL via OROMUCOSAL

## 2022-01-16 MED ORDER — POTASSIUM CHLORIDE CRYS ER 20 MEQ PO TBCR
40.0000 meq | EXTENDED_RELEASE_TABLET | Freq: Once | ORAL | Status: AC
Start: 2022-01-16 — End: 2022-01-16
  Administered 2022-01-16: 40 meq via ORAL
  Filled 2022-01-16: qty 2

## 2022-01-16 MED ORDER — FLUTICASONE FUROATE-VILANTEROL 200-25 MCG/ACT IN AEPB
1.0000 | INHALATION_SPRAY | Freq: Every day | RESPIRATORY_TRACT | Status: DC
  Administered 2022-01-17 – 2022-01-18 (×2): 1 via RESPIRATORY_TRACT
  Filled 2022-01-16: qty 28

## 2022-01-16 MED ORDER — ENOXAPARIN SODIUM 40 MG/0.4ML IJ SOSY
40.0000 mg | PREFILLED_SYRINGE | INTRAMUSCULAR | Status: DC
  Administered 2022-01-16 – 2022-01-17 (×2): 40 mg via SUBCUTANEOUS
  Filled 2022-01-16 (×2): qty 0.4

## 2022-01-16 MED ORDER — METHYLPREDNISOLONE SODIUM SUCC 125 MG IJ SOLR: 125.0000 mg | Freq: Once | INTRAMUSCULAR | Status: DC

## 2022-01-16 MED ORDER — ACETAMINOPHEN 500 MG PO TABS
1000.0000 mg | ORAL_TABLET | Freq: Once | ORAL | Status: AC
  Administered 2022-01-16: 1000 mg via ORAL
  Filled 2022-01-16: qty 2

## 2022-01-16 MED ORDER — INSULIN ASPART 100 UNIT/ML IJ SOLN
0.0000 [IU] | INTRAMUSCULAR | Status: DC
  Administered 2022-01-16 – 2022-01-17 (×2): 7 [IU] via SUBCUTANEOUS
  Administered 2022-01-17: 3 [IU] via SUBCUTANEOUS
  Administered 2022-01-17: 4 [IU] via SUBCUTANEOUS
  Filled 2022-01-16: qty 0.2

## 2022-01-16 MED ORDER — ALBUTEROL SULFATE (2.5 MG/3ML) 0.083% IN NEBU
10.0000 mg | INHALATION_SOLUTION | Freq: Once | RESPIRATORY_TRACT | Status: AC
  Administered 2022-01-16: 10 mg via RESPIRATORY_TRACT
  Filled 2022-01-16: qty 12

## 2022-01-16 NOTE — H&P (Signed)
? ? ?History and Physical ? ?Kathleen Rodriguez TKZ:[id number] DOB: [birth year] DOA: 01/16/2022 ? ?PCP: [name], MD ?Patient coming from: Home ? ?I have personally briefly reviewed patient's old medical records in [place] ? ? ?Chief Complaint: SOB ? ?HPI: Kathleen Rodriguez is a 56 y.o. female past medical history of severe persistent asthma for which she has been intubated in the past she relates has been more than a year since her last intubation last time she was in the hospital in December 20 22nd he was admitted to the ICU on BiPAP on Precedex due to severe anxiety,, essential hypertension, diabetes mellitus type 2, obstructive sleep apnea on CPAP alcohol and polysubstance abuse comes in for shortness of breath for which she is not getting any relief with the albuterol, she relates she continues to smoke, her son is sick with an upper respiratory tract infection for the past 5 days, for the past 4 days she has been having shortness of breath productive cough no hemoptysis denies any fevers but cannot walk to the bathroom due to her severe shortness of breath she relates she has been trying to use her inhalers but today had no improvement, she relates her shortness of breath has progressively gotten worse over the last 24 hours.  She called EMS they started her on steroids and albuterol with no improvement. ? ?In the ED: ?She was placed on BiPAP but she was not able to speak in full sentences after being placed on BiPAP her saturations have remained greater than 97%, has been afebrile, SARS-CoV-2 and influenza PCR were negative chest x-ray showed no acute findings ? ? ?Review of Systems: All systems reviewed and apart from history of presenting illness, are negative. ? ?Past Medical History:  ?Diagnosis Date  ? Arthritis   ? "knees, lower back; legs, ankles" (01/27/2016)  ? Asthma   ? followed by Dr. Halford Chessman  ? Asthma   ? CHF (congestive heart failure) ([place]) 2016  ? "when I went into a coma"  ? Cocaine abuse  ([place])   ? 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 Diabetes mellitus without complication ([place])   ? GERD (gastroesophageal reflux disease)   ? Hypertension   ? "doctor took me off RX in 2016" (01/27/2016)  ? Hypertension   ? Influenza B 01/2013  ? Complicated by multi-organ failure  ? OSA on CPAP "since " 03/20/2013  ? Pneumonia 2016  ? Required emergent intubation   ? asthma exacerbation in 2016  ? Skin ulcer ([place]) secondary to bullous impetigo Marilynn Rail 08/29/2018  ? Sleep apnea   ? Tobacco abuse   ? Upper airway cough syndrome   ? ?Past Surgical History:  ?Procedure Laterality Date  ? BREAST SURGERY Right 1980  ? as a teenager , cyst was benign  ? CESAREAN SECTION  2006  ? LACERATION REPAIR Right ~ 1997  ? "tried to cut myself"  ? TOTAL KNEE ARTHROPLASTY Left 09/01/2016  ? Procedure: LEFT TOTAL KNEE ARTHROPLASTY;  Surgeon: Leandrew Koyanagi, MD;  Location: [facility];  Service: Orthopedics;  Laterality: Left;  ? TUBAL LIGATION  2006  ? ?Social History:  reports that she quit smoking about 8 years ago. Her smoking use included cigarettes. She started smoking about 30 years ago. She has a 5.25 pack-year smoking history. She has never used smokeless tobacco. She reports that she does not currently use drugs after having used the following drugs: Cocaine and "Crack" cocaine. No history on file for alcohol use. ? ? ?  Allergies  ?Allergen Reactions  ? Tomato Hives, Itching and Other (See Comments)  ?  ALSO REACTS TO KETCHUP  ? Latex Itching and Rash  ? Wool Alcohol [Lanolin] Itching  ? ? ?Family History  ?Problem Relation Age of Onset  ? Asthma Mother   ? Heart murmur Mother   ? Cancer Maternal Grandmother   ? Heart disease Maternal Grandmother   ? Hypertension Maternal Grandmother   ? Cancer Paternal Grandmother   ? Hypertension Mother   ? HIV/AIDS Father   ?  ? ?Prior to Admission medications   ?Medication Sig Start Date End Date Taking? Authorizing Provider  ?Accu-Chek Softclix Lancets lancets Use to check blood sugar once daily.  10/05/21   [name], MD  ?albuterol (PROAIR HFA) 108 (90 Base) MCG/ACT inhaler TAKE 2 PUFFS BY MOUTH EVERY 6 HOURS AS NEEDED FOR WHEEZE OR SHORTNESS OF BREATH ?Patient taking differently: 2 puffs every 6 (six) hours as needed for wheezing or shortness of breath. 04/02/21   Shawna Clamp, MD  ?aspirin 81 MG EC tablet Take 1 tablet (81 mg total) by mouth daily. ?Patient not taking: Reported on 09/26/2021 02/07/19   [name], MD  ?Blood Glucose Monitoring Suppl (ACCU-CHEK GUIDE ME) w/Device KIT Use to check blood sugar once daily. 05/04/21   [name], MD  ?budesonide-formoterol (SYMBICORT) 160-4.5 MCG/ACT inhaler Inhale 2 puffs into the lungs 2 (two) times daily. 10/01/21   Mick Sell, PA-C  ?cloNIDine (CATAPRES) 0.2 MG tablet Take 1 tablet (0.2 mg total) by mouth 2 (two) times daily. For BP and hot flashes 10/05/21   [name], MD  ?fluconazole (DIFLUCAN) 150 MG tablet Take 1 tablet (150 mg total) by mouth daily. 10/19/21   Annita Brod, MD  ?fluticasone Asencion Islam) 50 MCG/ACT nasal spray Place 1 spray into both nostrils daily as needed for allergies. 01/04/21   Chesley Mires, MD  ?gabapentin (NEURONTIN) 300 MG capsule Take 1 capsule (300 mg total) by mouth 2 (two) times daily. 01/07/22   [name], MD  ?glucose blood (ACCU-CHEK GUIDE) test strip Use as instructed daily before breakfast. 10/05/21   [name], MD  ?guaiFENesin (ROBITUSSIN) 100 MG/5ML liquid Take 5 mLs by mouth every 4 (four) hours as needed for cough or to loosen phlegm. 10/19/21   Annita Brod, MD  ?hydrOXYzine (ATARAX) 10 MG tablet Take 1 tablet (10 mg total) by mouth 3 (three) times daily as needed for anxiety. 10/19/21   Annita Brod, MD  ?ipratropium-albuterol (DUONEB) 0.5-2.5 (3) MG/3ML SOLN Use 1 vial by nebulization 3 (three) times daily. 10/01/21   Mick Sell, PA-C  ?loratadine (CLARITIN) 10 MG tablet Take 1 tablet (10 mg total) by mouth daily. 10/02/21   Mick Sell, PA-C  ?metFORMIN  (GLUCOPHAGE) 500 MG tablet Take 1 tablet (500 mg total) by mouth 2 (two) times daily with a meal. 01/07/22   [name], MD  ?Misc. Devices MISC Blood pressure monitor  ?Dx: Hypertension 02/07/19   [name], MD  ?montelukast (SINGULAIR) 10 MG tablet Take 1 tablet (10 mg total) by mouth at bedtime. 10/01/21   Mick Sell, PA-C  ?pantoprazole (PROTONIX) 40 MG tablet Take 1 tablet (40 mg total) by mouth daily at 12 noon. ?Patient taking differently: Take 40 mg by mouth daily. 10/01/21   Mick Sell, PA-C  ? ?Physical Exam: ?Vitals:  ? 01/16/22 1615 01/16/22 1643 01/16/22 1655 01/16/22 1715  ?BP: (!) 173/89   134/75  ?Pulse: 87 95  87  ?  Resp: (!) 23 (!) 23  18  ?Temp:      ?TempSrc:      ?SpO2: 100% 100% 97% 100%  ?Weight:      ?Height:      ? ? ?General exam: Moderately built and nourished patient. ?Head, eyes and ENT: Nontraumatic and normocephalic. Pupils equally reacting to light and accommodation. Oral mucosa moist. ?Neck: Supple. No JVD, carotid bruit or thyromegaly. ?Lymphatics: No lymphadenopathy. ?Respiratory system: Good air movement with diffuse loud wheezing bilaterally she is able to speak in full sentences while her bypass is on ?Cardiovascular system: S1 and S2 heard, RRR. No JVD, murmurs, gallops, clicks or pedal edema. ?Gastrointestinal system: Abdomen is nondistended, soft and nontender.  ?Central nervous system: Alert and oriented. No focal neurological deficits. ?Extremities: Symmetric 5 x 5 power. Peripheral pulses symmetrically felt.  ?Skin: No rashes or acute findings. ?Musculoskeletal system: Negative exam. ?Psychiatry: Pleasant and cooperative. ? ? ?Labs on Admission:  ?Basic Metabolic Panel: ?Recent Labs  ?Lab 01/16/22 ?1539  ?NA 139  ?K 3.4*  ?CL 102  ?CO2 27  ?GLUCOSE 106*  ?BUN 11  ?CREATININE 0.74  ?CALCIUM 8.5*  ? ?Liver Function Tests: ?No results for input(s): AST, ALT, ALKPHOS, BILITOT, PROT, ALBUMIN in the last 168 hours. ?No results for input(s): LIPASE, AMYLASE in the  last 168 hours. ?No results for input(s): AMMONIA in the last 168 hours. ?CBC: ?Recent Labs  ?Lab 01/16/22 ?1539  ?WBC 6.0  ?NEUTROABS 1.9  ?HGB 14.8  ?HCT 44.6  ?MCV 86.6  ?PLT 301  ? ?Cardiac Enzymes: ?No results

## 2022-01-16 NOTE — ED Provider Notes (Signed)
Shared visit.  Patient with history of asthma.  Symptoms for the last 3 days.  Wheezing on exam.  She is ready on BiPAP upon my evaluation.  Also has a history of heart failure, cocaine abuse, diabetes.  She is mildly hypertensive but otherwise unremarkable vitals.  Looks comfortable on BiPAP.  Still with diminished breath sounds and wheezing.  Has gotten Solu-Medrol with EMS.  Already received continuous albuterol treatment, magnesium.  She does not appear to be volume overloaded.  No peripheral edema.  Differential diagnosis is pneumonia versus asthma exacerbation.  At this time no concern for ACS or heart failure.  I have reviewed and interpreted chest x-ray that shows no obvious pneumonia.  No obvious volume overload.  There is no significant anemia, electrolyte abnormality, kidney injury per my review and interpretation of labs.  EKG per my review and interpretation shows sinus rhythm.  No ischemic changes.  Patient has no PE risk factors.  Overall suspect respiratory failure in the setting of asthma exacerbation.  She appears to be improving on BiPAP and looks comfortable.  Will admit to medicine for further care. ? ?This chart was dictated using voice recognition software.  Despite best efforts to proofread,  errors can occur which can change the documentation meaning.  ?  Virgina Norfolk, DO ?01/16/22 1714 ? ?

## 2022-01-16 NOTE — Progress Notes (Signed)
RT enter the room to check on pt. Pt seemed very agitated and said she doesn't want anything all she want is to eat and want to talk to social worker. Bipap stand by and pt is in no resp distress. Message given to RN.  ?

## 2022-01-16 NOTE — ED Provider Notes (Signed)
?[facility] DEPT ?Provider Note ? ? ?CSN: [id number] ?Arrival date & time: 01/16/22  1452 ? ?  ? ?History ? ?Chief Complaint  ?Patient presents with  ? Shortness of Breath  ? Asthma  ? ? ?Kathleen Rodriguez is a 56 y.o. female who presents emergency department chief complaint of shortness of breath.  She has a longstanding history of severe persistent asthma.  Patient states that she tends to get worse this time of year.  She had recent exposure to URI as her son had a cold last week.  She does complain of productive cough.  Patient states she was on her way to donate plasma when she realized that she was having severe difficulty breathing, called EMS and arrived after being given some treatment including Solu-Medrol.  Patient states that she has had 3 days of significantly worsening shortness of breath.  She does tend to get increased exacerbations during the springtime due to seasonal allergies.  She has a history of hospitalization and intubation. ? ? ?Shortness of Breath ?Asthma ?Associated symptoms include shortness of breath.  ? ?  ? ?Home Medications ?Prior to Admission medications   ?Medication Sig Start Date End Date Taking? Authorizing Provider  ?Accu-Chek Softclix Lancets lancets Use to check blood sugar once daily. 10/05/21   [name], MD  ?albuterol (PROAIR HFA) 108 (90 Base) MCG/ACT inhaler TAKE 2 PUFFS BY MOUTH EVERY 6 HOURS AS NEEDED FOR WHEEZE OR SHORTNESS OF BREATH ?Patient taking differently: 2 puffs every 6 (six) hours as needed for wheezing or shortness of breath. 04/02/21   Shawna Clamp, MD  ?aspirin 81 MG EC tablet Take 1 tablet (81 mg total) by mouth daily. ?Patient not taking: Reported on 09/26/2021 02/07/19   [name], MD  ?Blood Glucose Monitoring Suppl (ACCU-CHEK GUIDE ME) w/Device KIT Use to check blood sugar once daily. 05/04/21   [name], MD  ?budesonide-formoterol (SYMBICORT) 160-4.5 MCG/ACT inhaler Inhale 2 puffs into the lungs 2 (two)  times daily. 10/01/21   Mick Sell, PA-C  ?cloNIDine (CATAPRES) 0.2 MG tablet Take 1 tablet (0.2 mg total) by mouth 2 (two) times daily. For BP and hot flashes 10/05/21   [name], MD  ?fluconazole (DIFLUCAN) 150 MG tablet Take 1 tablet (150 mg total) by mouth daily. 10/19/21   Annita Brod, MD  ?fluticasone Asencion Islam) 50 MCG/ACT nasal spray Place 1 spray into both nostrils daily as needed for allergies. 01/04/21   Chesley Mires, MD  ?gabapentin (NEURONTIN) 300 MG capsule Take 1 capsule (300 mg total) by mouth 2 (two) times daily. 01/07/22   [name], MD  ?glucose blood (ACCU-CHEK GUIDE) test strip Use as instructed daily before breakfast. 10/05/21   [name], MD  ?guaiFENesin (ROBITUSSIN) 100 MG/5ML liquid Take 5 mLs by mouth every 4 (four) hours as needed for cough or to loosen phlegm. 10/19/21   Annita Brod, MD  ?hydrOXYzine (ATARAX) 10 MG tablet Take 1 tablet (10 mg total) by mouth 3 (three) times daily as needed for anxiety. 10/19/21   Annita Brod, MD  ?ipratropium-albuterol (DUONEB) 0.5-2.5 (3) MG/3ML SOLN Use 1 vial by nebulization 3 (three) times daily. 10/01/21   Mick Sell, PA-C  ?loratadine (CLARITIN) 10 MG tablet Take 1 tablet (10 mg total) by mouth daily. 10/02/21   Mick Sell, PA-C  ?metFORMIN (GLUCOPHAGE) 500 MG tablet Take 1 tablet (500 mg total) by mouth 2 (two) times daily with a meal. 01/07/22   [name], MD  ?Misc.  Devices MISC Blood pressure monitor  ?Dx: Hypertension 02/07/19   [name], MD  ?montelukast (SINGULAIR) 10 MG tablet Take 1 tablet (10 mg total) by mouth at bedtime. 10/01/21   Mick Sell, PA-C  ?pantoprazole (PROTONIX) 40 MG tablet Take 1 tablet (40 mg total) by mouth daily at 12 noon. ?Patient taking differently: Take 40 mg by mouth daily. 10/01/21   Mick Sell, PA-C  ?   ? ?Allergies    ?Tomato, Latex, and Wool alcohol [lanolin]   ? ?Review of Systems   ?Review of Systems  ?Respiratory:  Positive for shortness of breath.    ? ?Physical Exam ?Updated Vital Signs ?BP (!) 176/89 (BP Location: Right Arm)   Pulse (!) 106   Temp 98.2 ?F (36.8 ?C) (Oral)   Resp (!) 26   Ht '5\' 5"'  (1.651 m)   Wt 98.4 kg   SpO2 95%   BMI 36.11 kg/m?  ?Physical Exam ?Vitals and nursing note reviewed.  ?Constitutional:   ?   General: She is not in acute distress. ?   Appearance: She is well-developed. She is not diaphoretic.  ?HENT:  ?   Head: Normocephalic and atraumatic.  ?   Right Ear: External ear normal.  ?   Left Ear: External ear normal.  ?   Nose: Nose normal.  ?   Mouth/Throat:  ?   Mouth: Mucous membranes are moist.  ?Eyes:  ?   General: No scleral icterus. ?   Conjunctiva/sclera: Conjunctivae normal.  ?Cardiovascular:  ?   Rate and Rhythm: Normal rate and regular rhythm.  ?   Heart sounds: Normal heart sounds. No murmur heard. ?  No friction rub. No gallop.  ?Pulmonary:  ?   Effort: Pulmonary effort is normal. Tachypnea and prolonged expiration present. No respiratory distress.  ?   Breath sounds: Decreased air movement present. No stridor. Wheezing and rhonchi present.  ?Abdominal:  ?   General: Bowel sounds are normal. There is no distension.  ?   Palpations: Abdomen is soft. There is no mass.  ?   Tenderness: There is no abdominal tenderness. There is no guarding.  ?Musculoskeletal:  ?   Cervical back: Normal range of motion.  ?Skin: ?   General: Skin is warm and dry.  ?Neurological:  ?   Mental Status: She is alert and oriented to person, place, and time.  ?Psychiatric:     ?   Behavior: Behavior normal.  ? ? ?ED Results / Procedures / Treatments   ?Labs ?(all labs ordered are listed, but only abnormal results are displayed) ?Labs Reviewed  ?RESP PANEL BY RT-PCR (FLU A&B, COVID) [id number]  ?BASIC METABOLIC PANEL  ?CBC WITH DIFFERENTIAL/PLATELET  ?I-STAT BETA HCG BLOOD, ED ([facility], [facility], [facility] ONLY)  ? ? ?EKG ?None ? ?Radiology ?No results found. ? ?Procedures ?Marland KitchenCritical Care ?Performed by: Margarita Mail, PA-C ?Authorized by: Margarita Mail, PA-C   ? ?Critical care provider statement:  ?  Critical care time (minutes):  60 ?  Critical care time was exclusive of:  Separately billable procedures and treating other patients ?  Critical care was necessary to treat or prevent imminent or life-threatening deterioration of the following conditions:  Respiratory failure ?  Critical care was time spent personally by me on the following activities:  Development of treatment plan with patient or surrogate, discussions with consultants, evaluation of patient's response to treatment, examination of patient, ordering and review of laboratory studies, ordering and review of radiographic studies, ordering and performing treatments  and interventions, pulse oximetry, re-evaluation of patient's condition and review of old charts  ? ? ?Medications Ordered in ED ?Medications  ?ipratropium (ATROVENT) nebulizer solution 1 mg (has no administration in time range)  ?magnesium sulfate IVPB 2 g 50 mL (has no administration in time range)  ?albuterol (PROVENTIL) (2.5 MG/3ML) 0.083% nebulizer solution 10 mg (has no administration in time range)  ? ? ?ED Course/ Medical Decision Making/ A&P ?Clinical Course as of 01/18/22 2212  ?Sun Jan 16, 2022  ?2036 55 year old female here with complaint of shortness of breath.  Believe the.  Patient appears to have asthma exacerbation.  She has already received albuterol, Atrovent and Solu-Medrol prior to arrival.  We will treat with an hour-long DuoNeb, magnesium, chest x-ray, labs and reevaluation. [AH]  ?  ?Clinical Course User Index ?[AH] Margarita Mail, PA-C  ? ?                        ?Medical Decision Making ?Amount and/or Complexity of Data Reviewed ?Labs: ordered. ?Radiology: ordered. ? ?Risk ?OTC drugs. ?Prescription drug management. ?Decision regarding hospitalization. ? ? ?This patient presents to the ED for concern of sob, this involves an extensive number of treatment options, and is a complaint that carries with it a high risk of  complications and morbidity.  The differential diagnosis includes The emergent differential diagnosis for shortness of breath includes, but is not limited to, Pulmonary edema, bronchoconstriction, Pneumonia, Pulmonary em

## 2022-01-16 NOTE — ED Triage Notes (Signed)
Pt bib ems for SOB and asthma execration. Pt states SOB getting worse over the past 3 days.  Pt given albuterol, Atrovent and 125 mg Solumedrol by ems w/out relief.  Pt having inspiratory and expiratory wheezing throughout lungs. ?

## 2022-01-16 NOTE — Progress Notes (Signed)
RT NOTE:  Pt transported from ED room to ICU room via BiPAP with no complications noted.  

## 2022-01-17 DIAGNOSIS — J9601 Acute respiratory failure with hypoxia: Secondary | ICD-10-CM | POA: Diagnosis not present

## 2022-01-17 DIAGNOSIS — J45901 Unspecified asthma with (acute) exacerbation: Secondary | ICD-10-CM | POA: Diagnosis not present

## 2022-01-17 DIAGNOSIS — J4551 Severe persistent asthma with (acute) exacerbation: Secondary | ICD-10-CM | POA: Diagnosis not present

## 2022-01-17 DIAGNOSIS — E119 Type 2 diabetes mellitus without complications: Secondary | ICD-10-CM | POA: Diagnosis not present

## 2022-01-17 LAB — HEMOGLOBIN A1C
Hgb A1c MFr Bld: 5.6 % (ref 4.8–5.6)
Mean Plasma Glucose: 114.02 mg/dL

## 2022-01-17 LAB — CBC
HCT: 39.1 % (ref 36.0–46.0)
Hemoglobin: 12.7 g/dL (ref 12.0–15.0)
MCH: 28.3 pg (ref 26.0–34.0)
MCHC: 32.5 g/dL (ref 30.0–36.0)
MCV: 87.1 fL (ref 80.0–100.0)
Platelets: 264 10*3/uL (ref 150–400)
RBC: 4.49 MIL/uL (ref 3.87–5.11)
RDW: 15.6 % — ABNORMAL HIGH (ref 11.5–15.5)
WBC: 5.7 10*3/uL (ref 4.0–10.5)
nRBC: 0 % (ref 0.0–0.2)

## 2022-01-17 LAB — BASIC METABOLIC PANEL
Anion gap: 5 (ref 5–15)
BUN: 12 mg/dL (ref 6–20)
CO2: 26 mmol/L (ref 22–32)
Calcium: 8.4 mg/dL — ABNORMAL LOW (ref 8.9–10.3)
Chloride: 108 mmol/L (ref 98–111)
Creatinine, Ser: 0.64 mg/dL (ref 0.44–1.00)
GFR, Estimated: >60 mL/min (ref >60)
Glucose, Bld: 120 mg/dL — ABNORMAL HIGH (ref 70–99)
Potassium: 3.6 mmol/L (ref 3.5–5.1)
Sodium: 139 mmol/L (ref 135–145)

## 2022-01-17 LAB — GLUCOSE, CAPILLARY
Glucose-Capillary: 104 mg/dL — ABNORMAL HIGH (ref 70–99)
Glucose-Capillary: 115 mg/dL — ABNORMAL HIGH (ref 70–99)
Glucose-Capillary: 140 mg/dL — ABNORMAL HIGH (ref 70–99)
Glucose-Capillary: 171 mg/dL — ABNORMAL HIGH (ref 70–99)
Glucose-Capillary: 232 mg/dL — ABNORMAL HIGH (ref 70–99)
Glucose-Capillary: 78 mg/dL (ref 70–99)

## 2022-01-17 LAB — MAGNESIUM: Magnesium: 2.1 mg/dL (ref 1.7–2.4)

## 2022-01-17 MED ORDER — ALBUTEROL SULFATE (2.5 MG/3ML) 0.083% IN NEBU
2.5000 mg | INHALATION_SOLUTION | Freq: Three times a day (TID) | RESPIRATORY_TRACT | Status: DC
  Administered 2022-01-17 – 2022-01-18 (×2): 2.5 mg via RESPIRATORY_TRACT
  Filled 2022-01-17 (×2): qty 3

## 2022-01-17 MED ORDER — MONTELUKAST SODIUM 10 MG PO TABS
10.0000 mg | ORAL_TABLET | Freq: Every day | ORAL | Status: DC
Start: 2022-01-17 — End: 2022-01-18
  Administered 2022-01-17: 10 mg via ORAL
  Filled 2022-01-17: qty 1

## 2022-01-17 MED ORDER — ALBUTEROL SULFATE (2.5 MG/3ML) 0.083% IN NEBU
2.5000 mg | INHALATION_SOLUTION | Freq: Four times a day (QID) | RESPIRATORY_TRACT | Status: DC
  Administered 2022-01-17 (×2): 2.5 mg via RESPIRATORY_TRACT
  Filled 2022-01-17 (×2): qty 3

## 2022-01-17 MED ORDER — METHOCARBAMOL 500 MG PO TABS: 500.0000 mg | ORAL_TABLET | Freq: Three times a day (TID) | ORAL | Status: DC | PRN

## 2022-01-17 NOTE — Progress Notes (Signed)
[facility] ?PROGRESS NOTE ? ? ? ?Progress Note  ?ALANE HANSSEN  LNL:[id number] DOB: [birth year] DOA: 01/16/2022 ?PCP: Hoy Register, MD  ? ? ? ?Brief Narrative:  ? ?NOREENE BOREMAN is an 56 y.o. female past medical history significant for severe persistent asthma which the patient says has required intubation in the past more than a year ago hospitalized in December admitted to the ICU on BiPAP and Precedex due to her severe anxiety obstructive sleep apnea alcohol and polysubstance abuse comes in for shortness of breath ? ? ? ?Assessment/Plan:  ? ?Acute respiratory failure with hypoxia secondarily to Acute asthma exacerbation ?Currently on nasal cannula 4 L of oxygen with saturations greater 90%. ?She is tolerating her diet. ?Been afebrile with no leukocytosis. ?Continue steroids and inhalers will wean to oxygen. ?Transfer to MedSurg discontinue telemetry. ? ?Diabetes mellitus without complication (HCC): ?A1c of 5.6 question if she is truly diabetic. ?Her blood glucose being erratic due to steroids, continue long-acting insulin per sliding scale. ? ?OSA (obstructive sleep apnea) ?Continue BiPAP at night. ? ?Essential hypertension ?Continue clonidine, blood pressure is high resume her home regimen. ? ?DVT prophylaxis: lovenox ?Family Communication:none ?Status is: Inpatient, transfer to MedSurg ?Remains inpatient appropriate because: Acute respiratory failure with hypoxia secondary to asthma exacerbation ? ? ? ?Code Status:  ? ?  ?Code Status Orders  ?(From admission, onward)  ?  ? ? ?  ? ?  Start     Ordered  ? 01/16/22 1850  Full code  Continuous       ? 01/16/22 1849  ? ?  ?  ? ?  ? ?Code Status History   ? ? Date Active Date Inactive Code Status Order ID Comments User Context  ? 10/16/2021 0929 10/19/2021 2139 Full Code [id number]  Clydie Braun, MD ED  ? 09/25/2021 2343 10/01/2021 1937 Full Code [id number]  Synetta Fail, MD ED  ? 03/31/2021 1144 04/02/2021 1932 Full Code [id number]  Teddy Spike, DO  ED  ? 01/20/2021 2235 01/21/2021 1830 Full Code [id number]  Therisa Doyne, MD Inpatient  ? 07/02/2020 2312 07/06/2020 1640 Full Code [id number]  Briscoe Deutscher, MD ED  ? 10/09/2019 2300 10/11/2019 1707 Full Code [id number]  Briscoe Deutscher, MD ED  ? 12/24/2018 2348 12/27/2018 1750 Full Code [id number]  Lorretta Harp, MD ED  ? 08/07/2018 2207 08/10/2018 1943 Full Code [id number]  Lorretta Harp, MD ED  ? 07/31/2018 0329 08/02/2018 1723 Full Code [id number]  Eduard Clos, MD Inpatient  ? 11/30/2017 0501 12/02/2017 1610 Full Code [id number]  Eduard Clos, MD ED  ? 10/20/2017 1742 10/23/2017 1656 Full Code [id number]  Pearson Grippe, MD Inpatient  ? 07/09/2017 1232 07/09/2017 1723 Full Code [id number]  Eston Esters, MD ED  ? 03/18/2017 0931 03/20/2017 1829 Full Code [id number]  Gwenyth Bender, NP ED  ? 09/01/2016 1709 09/03/2016 1806 Full Code [id number]  Tarry Kos, MD Inpatient  ? 06/24/2016 0318 06/26/2016 1540 Full Code [id number]  Clydie Braun, MD ED  ? 05/12/2016 2144 05/15/2016 1359 Full Code [id number]  Lorretta Harp, MD ED  ? 01/27/2016 0736 01/28/2016 1444 Full Code [id number]  Gwenyth Bender, NP ED  ? 11/21/2015 0215 11/22/2015 1414 Full Code [id number]  Lorretta Harp, MD ED  ? 08/10/2015 0035 08/12/2015 1359 Full Code [id number]  Eduard Clos, MD Inpatient  ? 07/24/2015 2326 07/27/2015 1356 Full Code [id number]  Ron Parker, MD Inpatient  ? 12/28/2014 1531 12/29/2014 1808 Full Code [id number]  Russella DarEllis, Allison L, NP Inpatient  ? 05/16/2014 0340 05/17/2014 1540 Full Code [id number]  Fran LowesSwayze, Ava, DO Inpatient  ? 04/06/2014 0534 04/09/2014 1535 Full Code [id number]  Therisa Doyneoutova, Anastassia, MD Inpatient  ? 02/05/2013 2323 02/21/2013 1936 Full Code [id number]  Houston SirenLe, Peter, MD Inpatient  ? ?  ? ? ? ? ?IV Access:  ? ?Peripheral IV ? ? ?Procedures and diagnostic studies:  ? ?DG Chest Port 1 View ? ?Result Date: 01/16/2022 ?CLINICAL DATA:  Asthma exacerbation, worsening shortness of breath for 3 days EXAM: PORTABLE CHEST 1 VIEW COMPARISON:  10/16/2021 FINDINGS:  Frontal and lateral views of the chest demonstrate a stable cardiac silhouette. Slight flattening of the hemidiaphragms consistent with hyperinflation. Mild background interstitial prominence compatible with given history of asthma. No focal consolidation, effusion, or pneumothorax. IMPRESSION: 1. Findings consistent with given history of asthma, with mild hyperinflation. 2. No acute airspace disease. Electronically Signed   By: Sharlet SalinaMichael  Brown M.D.   On: 01/16/2022 15:55   ? ? ?Medical Consultants:  ? ?None. ? ? ?Subjective:  ? ? ?Star AgeLouise H Cassin relates her shortness of breath significantly improved would like to continue her diet. ? ?Objective:  ? ? ?Vitals:  ? 01/17/22 0300 01/17/22 0312 01/17/22 0400 01/17/22 0500  ?BP: (!) 156/72  (!) 142/91 (!) 141/92  ?Pulse: 71 66 66 65  ?Resp: 18 16 16 14   ?Temp:  97.6 ?F (36.4 ?C)    ?TempSrc:  Oral    ?SpO2: 98% 99% 96% 97%  ?Weight:      ?Height:      ? ?SpO2: 97 % ?FiO2 (%): 40 % ? ?No intake or output data in the 24 hours ending 01/17/22 0700 ?Filed Weights  ? 01/16/22 1505  ?Weight: 98.4 kg  ? ? ?Exam: ?General exam: In no acute distress. ?Respiratory system: Good air movement and diffuse wheezing bilaterally ?Cardiovascular system: S1 & S2 heard, RRR. No JVD. ?Gastrointestinal system: Abdomen is nondistended, soft and nontender.  ?Extremities: No pedal edema. ?Skin: No rashes, lesions or ulcers ?Psychiatry: Judgement and insight appear normal. Mood & affect appropriate.  ? ? ?Data Reviewed:  ? ? ?Labs: ?Basic Metabolic Panel: ?Recent Labs  ?Lab 01/16/22 ?1539 01/17/22 ?0305  ?NA 139 139  ?K 3.4* 3.6  ?CL 102 108  ?CO2 27 26  ?GLUCOSE 106* 120*  ?BUN 11 12  ?CREATININE 0.74 0.64  ?CALCIUM 8.5* 8.4*  ?MG  --  2.1  ? ?GFR ?Estimated Creatinine Clearance: 91.2 mL/min (by C-G formula based on SCr of 0.64 mg/dL). ?Liver Function Tests: ?No results for input(s): AST, ALT, ALKPHOS, BILITOT, PROT, ALBUMIN in the last 168 hours. ?No results for input(s): LIPASE, AMYLASE in the  last 168 hours. ?No results for input(s): AMMONIA in the last 168 hours. ?Coagulation profile ?No results for input(s): INR, PROTIME in the last 168 hours. ?COVID-19 Labs ? ?No results for input(s): DDIMER, FERRITIN, LDH, CRP in the last 72 hours. ? ?Lab Results  ?Component Value Date  ? SARSCOV2NAA NEGATIVE 01/16/2022  ? SARSCOV2NAA NEGATIVE 10/16/2021  ? SARSCOV2NAA NEGATIVE 09/25/2021  ? SARSCOV2NAA NEGATIVE 03/31/2021  ? ? ?CBC: ?Recent Labs  ?Lab 01/16/22 ?1539 01/17/22 ?0305  ?WBC 6.0 5.7  ?NEUTROABS 1.9  --   ?HGB 14.8 12.7  ?HCT 44.6 39.1  ?MCV 86.6 87.1  ?PLT 301 264  ? ?Cardiac Enzymes: ?No results for input(s): CKTOTAL, CKMB, CKMBINDEX, TROPONINI in the last 168 hours. ?BNP (last 3 results) ?No results for input(s): PROBNP in the last 8760 hours. ?CBG: ?Recent Labs  ?  Lab 01/16/22 ?1931 01/17/22 ?0006 01/17/22 ?0303  ?GLUCAP 213* 232* 115*  ? ?D-Dimer: ?No results for input(s): DDIMER in the last 72 hours. ?Hgb A1c: ?Recent Labs  ?  01/17/22 ?0305  ?HGBA1C 5.6  ? ?Lipid Profile: ?No results for input(s): CHOL, HDL, LDLCALC, TRIG, CHOLHDL, LDLDIRECT in the last 72 hours. ?Thyroid function studies: ?No results for input(s): TSH, T4TOTAL, T3FREE, THYROIDAB in the last 72 hours. ? ?Invalid input(s): FREET3 ?Anemia work up: ?No results for input(s): VITAMINB12, FOLATE, FERRITIN, TIBC, IRON, RETICCTPCT in the last 72 hours. ?Sepsis Labs: ?Recent Labs  ?Lab 01/16/22 ?1539 01/17/22 ?0305  ?WBC 6.0 5.7  ? ?Microbiology ?Recent Results (from the past 240 hour(s))  ?Resp Panel by RT-PCR (Flu A&B, Covid) Nasopharyngeal Swab     Status: None  ? Collection Time: 01/16/22  3:41 PM  ? Specimen: Nasopharyngeal Swab; Nasopharyngeal(NP) swabs in vial transport medium  ?Result Value Ref Range Status  ? SARS Coronavirus 2 by RT PCR NEGATIVE NEGATIVE Final  ?  Comment: (NOTE) ?SARS-CoV-2 target nucleic acids are NOT DETECTED. ? ?The SARS-CoV-2 RNA is generally detectable in upper respiratory ?specimens during the acute phase of  infection. The lowest ?concentration of SARS-CoV-2 viral copies this assay can detect is ?138 copies/mL. A negative result does not preclude SARS-Cov-2 ?infection and should not be used as the sole b

## 2022-01-18 LAB — RAPID URINE DRUG SCREEN, HOSP PERFORMED
Amphetamines: NOT DETECTED
Barbiturates: NOT DETECTED
Benzodiazepines: NOT DETECTED
Cocaine: POSITIVE — AB
Opiates: NOT DETECTED
Tetrahydrocannabinol: NOT DETECTED

## 2022-01-18 LAB — GLUCOSE, CAPILLARY
Glucose-Capillary: 119 mg/dL — ABNORMAL HIGH (ref 70–99)
Glucose-Capillary: 119 mg/dL — ABNORMAL HIGH (ref 70–99)
Glucose-Capillary: 126 mg/dL — ABNORMAL HIGH (ref 70–99)
Glucose-Capillary: 85 mg/dL (ref 70–99)

## 2022-01-18 MED ORDER — PREDNISONE 10 MG PO TABS: ORAL_TABLET | ORAL | 0 refills | Status: DC

## 2022-01-18 MED ORDER — SALINE SPRAY 0.65 % NA SOLN
1.0000 | NASAL | Status: DC | PRN
  Administered 2022-01-18: 1 via NASAL
  Filled 2022-01-18: qty 44

## 2022-01-18 MED ORDER — CLONIDINE HCL 0.2 MG PO TABS: 0.2000 mg | ORAL_TABLET | Freq: Two times a day (BID) | ORAL | 2 refills | Status: DC

## 2022-01-18 MED ORDER — FLUCONAZOLE 100 MG PO TABS: 100.0000 mg | ORAL_TABLET | Freq: Every day | ORAL | 0 refills | Status: AC

## 2022-01-18 MED ORDER — METFORMIN HCL 500 MG PO TABS: 500.0000 mg | ORAL_TABLET | Freq: Two times a day (BID) | ORAL | 0 refills | Status: DC

## 2022-01-18 NOTE — Progress Notes (Signed)
Pt placed on dream station bileval ventilation for the night. ?

## 2022-01-18 NOTE — Discharge Summary (Signed)
Physician Discharge Summary  ?SHRUTI ARREY EOF:[id number] DOB: 1965-10-25 DOA: 01/16/2022 ? ?PCP: [name], MD ? ?Admit date: 01/16/2022 ?Discharge date: 01/18/2022 ? ?Admitted From: Home ?Disposition:  Home ? ?Recommendations for Outpatient Follow-up:  ?Follow up with PCP in 1-2 weeks ?Please obtain BMP/CBC in one week ? ? ?Home Health:No ?Equipment/Devices:none ? ?Discharge Condition:Stable ?CODE STATUS:Full ?Diet recommendation: Heart Healthy  ? ?Brief/Interim Summary: ?56 y.o. female past medical history significant for severe persistent asthma which the patient says has required intubation in the past more than a year ago hospitalized in December admitted to the ICU on BiPAP and Precedex due to her severe anxiety obstructive sleep apnea alcohol and polysubstance abuse comes in for shortness of breath ? ?Discharge Diagnoses:  ?Principal Problem: ?  Acute asthma exacerbation ?Active Problems: ?  Diabetes mellitus without complication ([place]) ?  OSA (obstructive sleep apnea) ?  Essential hypertension ?  Acute respiratory failure with hypoxia ([place]) ? ?Acute respiratory failure with hypoxia secondary to acute asthma exacerbation: ?She was placed on BiPAP started on IV steroids inhalers and she was given IV mag she remained afebrile with no leukocytosis chest x-ray showed no infiltrates. ?We were able to wean her to room air she will continue home on steroid taper. ? ?Diabetes mellitus type 2 without complications: ?With an G5Q of 5.6. ?She will continue metformin at home. ? ?Struct of sleep apnea: ?Continue BiPAP at night. ? ?Essential hypertension: ?Continue clonidine. ? ?Vaginal candidiasis: ?She was started on Diflucan which she will continue as an outpatient is likely due to steroids. ? ?Discharge Instructions ? ?Discharge Instructions   ? ? Diet - low sodium heart healthy   Complete by: As directed ?  ? Increase activity slowly   Complete by: As directed ?  ? ?  ? ?Allergies as of 01/18/2022   ? ?   Reactions   ? Tomato Hives, Itching, Other (See Comments)  ? ALSO REACTS TO KETCHUP  ? Latex Itching, Rash  ? Wool Alcohol [lanolin] Itching  ? ?  ? ?  ?Medication List  ?  ? ?STOP taking these medications   ? ?Accu-Chek Softclix Lancets lancets ?  ?guaiFENesin 100 MG/5ML liquid ?Commonly known as: ROBITUSSIN ?  ?hydrOXYzine 10 MG tablet ?Commonly known as: ATARAX ?  ?methocarbamol 500 MG tablet ?Commonly known as: ROBAXIN ?  ?Misc. Devices Misc ?  ? ?  ? ?TAKE these medications   ? ?Accu-Chek Guide Me w/Device Kit ?Use to check blood sugar once daily. ?  ?Accu-Chek Guide test strip ?Generic drug: glucose blood ?Use as instructed daily before breakfast. ?  ?albuterol 108 (90 Base) MCG/ACT inhaler ?Commonly known as: ProAir HFA ?TAKE 2 PUFFS BY MOUTH EVERY 6 HOURS AS NEEDED FOR WHEEZE OR SHORTNESS OF BREATH ?What changed:  ?how much to take ?when to take this ?reasons to take this ?additional instructions ?  ?aspirin 81 MG EC tablet ?Take 1 tablet (81 mg total) by mouth daily. ?  ?cloNIDine 0.2 MG tablet ?Commonly known as: CATAPRES ?Take 1 tablet (0.2 mg total) by mouth 2 (two) times daily. For BP and hot flashes ?  ?fluconazole 100 MG tablet ?Commonly known as: Diflucan ?Take 1 tablet (100 mg total) by mouth daily for 5 days. ?  ?fluticasone 50 MCG/ACT nasal spray ?Commonly known as: FLONASE ?Place 1 spray into both nostrils daily as needed for allergies. ?What changed:  ?how much to take ?when to take this ?  ?gabapentin 300 MG capsule ?Commonly known as: NEURONTIN ?Take 1 capsule (  300 mg total) by mouth 2 (two) times daily. ?What changed: when to take this ?  ?ibuprofen 800 MG tablet ?Commonly known as: ADVIL ?Take 800 mg by mouth every 8 (eight) hours as needed (for pain). ?  ?ipratropium-albuterol 0.5-2.5 (3) MG/3ML Soln ?Commonly known as: DUONEB ?Use 1 vial by nebulization 3 (three) times daily. ?What changed:  ?when to take this ?reasons to take this ?  ?loratadine 10 MG tablet ?Commonly known as: CLARITIN ?Take 1  tablet (10 mg total) by mouth daily. ?  ?metFORMIN 500 MG tablet ?Commonly known as: GLUCOPHAGE ?Take 1 tablet (500 mg total) by mouth 2 (two) times daily with a meal. ?  ?montelukast 10 MG tablet ?Commonly known as: SINGULAIR ?Take 1 tablet (10 mg total) by mouth at bedtime. ?  ?pantoprazole 40 MG tablet ?Commonly known as: PROTONIX ?Take 1 tablet (40 mg total) by mouth daily at 12 noon. ?What changed: when to take this ?  ?predniSONE 10 MG tablet ?Commonly known as: DELTASONE ?Takes 6 tablets for 1 days, then 5 tablets for 1 days, then 4 tablets for 1 days, then 3 tablets for 1 days, then 2 tabs for 1 days, then 1 tab for 1 days, and then stop. ?  ?Symbicort 160-4.5 MCG/ACT inhaler ?Generic drug: budesonide-formoterol ?Inhale 2 puffs into the lungs 2 (two) times daily. ?  ? ?  ? ? ?Allergies  ?Allergen Reactions  ? Tomato Hives, Itching and Other (See Comments)  ?  ALSO REACTS TO KETCHUP  ? Latex Itching and Rash  ? Wool Alcohol [Lanolin] Itching  ? ? ?Consultations: ?None ? ? ?Procedures/Studies: ?DG Chest Port 1 View ? ?Result Date: 01/16/2022 ?CLINICAL DATA:  Asthma exacerbation, worsening shortness of breath for 3 days EXAM: PORTABLE CHEST 1 VIEW COMPARISON:  10/16/2021 FINDINGS: Frontal and lateral views of the chest demonstrate a stable cardiac silhouette. Slight flattening of the hemidiaphragms consistent with hyperinflation. Mild background interstitial prominence compatible with given history of asthma. No focal consolidation, effusion, or pneumothorax. IMPRESSION: 1. Findings consistent with given history of asthma, with mild hyperinflation. 2. No acute airspace disease. Electronically Signed   By: Randa Ngo M.D.   On: 01/16/2022 15:55   ?(Echo, Carotid, EGD, Colonoscopy, ERCP)  ? ? ?Subjective: ?No complaints ? ?Discharge Exam: ?Vitals:  ? 01/18/22 0726 01/18/22 0900  ?BP:  (!) 145/74  ?Pulse:    ?Resp:    ?Temp:    ?SpO2: 95%   ? ?Vitals:  ? 01/18/22 0111 01/18/22 0428 01/18/22 0726 01/18/22 0900   ?BP: (!) 158/100 (!) 161/102  (!) 145/74  ?Pulse: 62 (!) 53    ?Resp: 17 17    ?Temp: 97.8 ?F (36.6 ?C) 97.9 ?F (36.6 ?C)    ?TempSrc: Oral     ?SpO2: 95% 95% 95%   ?Weight:      ?Height:      ? ? ?General: Pt is alert, awake, not in acute distress ?Cardiovascular: RRR, S1/S2 +, no rubs, no gallops ?Respiratory: CTA bilaterally, no wheezing, no rhonchi ?Abdominal: Soft, NT, ND, bowel sounds + ?Extremities: no edema, no cyanosis ? ? ? ?The results of significant diagnostics from this hospitalization (including imaging, microbiology, ancillary and laboratory) are listed below for reference.   ? ? ?Microbiology: ?Recent Results (from the past 240 hour(s))  ?Resp Panel by RT-PCR (Flu A&B, Covid) Nasopharyngeal Swab     Status: None  ? Collection Time: 01/16/22  3:41 PM  ? Specimen: Nasopharyngeal Swab; Nasopharyngeal(NP) swabs in vial transport medium  ?Result  Value Ref Range Status  ? SARS Coronavirus 2 by RT PCR NEGATIVE NEGATIVE Final  ?  Comment: (NOTE) ?SARS-CoV-2 target nucleic acids are NOT DETECTED. ? ?The SARS-CoV-2 RNA is generally detectable in upper respiratory ?specimens during the acute phase of infection. The lowest ?concentration of SARS-CoV-2 viral copies this assay can detect is ?138 copies/mL. A negative result does not preclude SARS-Cov-2 ?infection and should not be used as the sole basis for treatment or ?other patient management decisions. A negative result may occur with  ?improper specimen collection/handling, submission of specimen other ?than nasopharyngeal swab, presence of viral mutation(s) within the ?areas targeted by this assay, and inadequate number of viral ?copies(<138 copies/mL). A negative result must be combined with ?clinical observations, patient history, and epidemiological ?information. The expected result is Negative. ? ?Fact Sheet for Patients:  ?EntrepreneurPulse.com.au ? ?Fact Sheet for Healthcare Providers:   ?IncredibleEmployment.be ? ?This test is no t yet approved or cleared by the [place] FDA and  ?has been authorized for detection and/or diagnosis of SARS-CoV-2 by ?FDA under an Emergency Use Authorization (EUA). This EUA will re

## 2022-01-18 NOTE — Plan of Care (Signed)
  Problem: Education: Goal: Knowledge of General Education information will improve Description: Including pain rating scale, medication(s)/side effects and non-pharmacologic comfort measures Outcome: Progressing   Problem: Clinical Measurements: Goal: Respiratory complications will improve Outcome: Progressing   Problem: Safety: Goal: Ability to remain free from injury will improve Outcome: Progressing   

## 2022-01-18 NOTE — Plan of Care (Signed)

## 2022-01-19 ENCOUNTER — Ambulatory Visit: Payer: Medicaid Other | Admitting: Family Medicine

## 2022-01-19 ENCOUNTER — Telehealth: Payer: Self-pay

## 2022-01-19 NOTE — Telephone Encounter (Signed)
Transition Care Management Follow-up Telephone Call ?Date of discharge and from where: 01/18/2022 from [facility] ?How have you been since you were released from the hospital? Patient stated that she is feeling better. Patient did not have any questions or concerns at this time.  ?Any questions or concerns? No ? ?Items Reviewed: ?Did the pt receive and understand the discharge instructions provided? Yes  ?Medications obtained and verified? Yes  ?Other? No  ?Any new allergies since your discharge? No  ?Dietary orders reviewed? No ?Do you have support at home? Yes  ? ?Functional Questionnaire: (I = Independent and D = Dependent) ?ADLs: I ? ?Bathing/Dressing- I ? ?Meal Prep- I ? ?Eating- I ? ?Maintaining continence- I ? ?Transferring/Ambulation- I ? ?Managing Meds- I ? ? ?Follow up appointments reviewed: ? ?PCP Hospital f/u appt confirmed? Yes  Scheduled to see Georgian Co, PA-C on 02/02/2022 @ 03:30pm. ?Specialist Hospital f/u appt confirmed? No   ?Are transportation arrangements needed? No  ?If their condition worsens, is the pt aware to call PCP or go to the Emergency Dept.? Yes ?Was the patient provided with contact information for the PCP's office or ED? Yes ?Was to pt encouraged to call back with questions or concerns? Yes ? ?

## 2022-01-27 ENCOUNTER — Ambulatory Visit: Payer: Medicaid Other | Admitting: Physician Assistant

## 2022-02-02 ENCOUNTER — Ambulatory Visit: Payer: Medicaid Other | Admitting: Physician Assistant

## 2022-02-02 NOTE — Progress Notes (Deleted)
Patient ID: Kathleen Rodriguez, female   DOB: Apr 27, 1966, 56 y.o.   MRN: [id number] ?After hospitalization 3/26-3/28/2023 ? ?From discharge summary: ?Brief/Interim Summary: ?56 y.o. female past medical history significant for severe persistent asthma which the patient says has required intubation in the past more than a year ago hospitalized in December admitted to the ICU on BiPAP and Precedex due to her severe anxiety obstructive sleep apnea alcohol and polysubstance abuse comes in for shortness of breath ?  ?Discharge Diagnoses:  ?Principal Problem: ?  Acute asthma exacerbation ?Active Problems: ?  Diabetes mellitus without complication ([facility]) ?  OSA (obstructive sleep apnea) ?  Essential hypertension ?  Acute respiratory failure with hypoxia ([facility]) ?  ?Acute respiratory failure with hypoxia secondary to acute asthma exacerbation: ?She was placed on BiPAP started on IV steroids inhalers and she was given IV mag she remained afebrile with no leukocytosis chest x-ray showed no infiltrates. ?We were able to wean her to room air she will continue home on steroid taper. ? ?Diabetes mellitus type 2 without complications: ?With an A1c of 5.6. ?She will continue metformin at home. ? ?Struct of sleep apnea: ?Continue BiPAP at night. ? ?Essential hypertension: ?Continue clonidine. ? ?Vaginal candidiasis: ?She was started on Diflucan which she will continue as an outpatient is likely due to steroids. ?

## 2022-02-07 ENCOUNTER — Other Ambulatory Visit: Payer: Self-pay | Admitting: Obstetrics and Gynecology

## 2022-02-07 NOTE — Patient Outreach (Signed)
Care Coordination ? ?02/07/2022 ? ?[name] ?[birth year] ?[phone] ? ?RNCM called patient at scheduled time,  patient's husband answered and stated she was at a doctor appointment.  RNCM will reschedule appointment. ? ?[name] RN, BSN ?[place]  [facility] ?Care Management Coordinator - Managed Medicaid High Risk ?[phone] ?  ? ? ?

## 2022-02-09 ENCOUNTER — Other Ambulatory Visit: Payer: Self-pay | Admitting: Family Medicine

## 2022-02-09 ENCOUNTER — Telehealth: Payer: Self-pay | Admitting: Nurse Practitioner

## 2022-02-09 DIAGNOSIS — R7303 Prediabetes: Secondary | ICD-10-CM

## 2022-02-09 NOTE — Telephone Encounter (Signed)
I left a messge for this patient to call back with any questions and if she had an SD card in her CPAP if she was wearing it to bring it to the appointment.  ?

## 2022-02-10 ENCOUNTER — Ambulatory Visit (INDEPENDENT_AMBULATORY_CARE_PROVIDER_SITE_OTHER): Payer: Medicaid Other | Admitting: Nurse Practitioner

## 2022-02-10 ENCOUNTER — Encounter: Payer: Self-pay | Admitting: Nurse Practitioner

## 2022-02-10 VITALS — BP 120/86 | HR 74 | Ht 65.5 in | Wt 209.0 lb

## 2022-02-10 DIAGNOSIS — K219 Gastro-esophageal reflux disease without esophagitis: Secondary | ICD-10-CM | POA: Diagnosis not present

## 2022-02-10 DIAGNOSIS — J301 Allergic rhinitis due to pollen: Secondary | ICD-10-CM

## 2022-02-10 DIAGNOSIS — J302 Other seasonal allergic rhinitis: Secondary | ICD-10-CM | POA: Insufficient documentation

## 2022-02-10 DIAGNOSIS — G4733 Obstructive sleep apnea (adult) (pediatric): Secondary | ICD-10-CM

## 2022-02-10 DIAGNOSIS — J4551 Severe persistent asthma with (acute) exacerbation: Secondary | ICD-10-CM

## 2022-02-10 DIAGNOSIS — J449 Chronic obstructive pulmonary disease, unspecified: Secondary | ICD-10-CM

## 2022-02-10 LAB — URINALYSIS, ROUTINE W REFLEX MICROSCOPIC
Bilirubin Urine: NEGATIVE
Hgb urine dipstick: NEGATIVE
Ketones, ur: NEGATIVE
Leukocytes,Ua: NEGATIVE
Nitrite: NEGATIVE
RBC / HPF: NONE SEEN (ref 0–?)
Specific Gravity, Urine: 1.025 (ref 1.000–1.030)
Total Protein, Urine: NEGATIVE
Urine Glucose: NEGATIVE
Urobilinogen, UA: 0.2 (ref 0.0–1.0)
pH: 6 (ref 5.0–8.0)

## 2022-02-10 LAB — POCT EXHALED NITRIC OXIDE: FeNO level (ppb): 9

## 2022-02-10 MED ORDER — PANTOPRAZOLE SODIUM 40 MG PO TBEC: 40.0000 mg | DELAYED_RELEASE_TABLET | Freq: Every day | ORAL | 5 refills | Status: DC

## 2022-02-10 MED ORDER — MONTELUKAST SODIUM 10 MG PO TABS: 10.0000 mg | ORAL_TABLET | Freq: Every day | ORAL | 5 refills | Status: DC

## 2022-02-10 MED ORDER — PREDNISONE 10 MG PO TABS: ORAL_TABLET | ORAL | 0 refills | Status: DC

## 2022-02-10 MED ORDER — BREZTRI AEROSPHERE 160-9-4.8 MCG/ACT IN AERO: 2.0000 | INHALATION_SPRAY | Freq: Two times a day (BID) | RESPIRATORY_TRACT | 5 refills | Status: DC

## 2022-02-10 MED ORDER — PREDNISONE 10 MG PO TABS
10.0000 mg | ORAL_TABLET | Freq: Every day | ORAL | 1 refills | Status: DC
Start: 2022-02-10 — End: 2022-08-08

## 2022-02-10 MED ORDER — BREZTRI AEROSPHERE 160-9-4.8 MCG/ACT IN AERO: 2.0000 | INHALATION_SPRAY | Freq: Two times a day (BID) | RESPIRATORY_TRACT | 0 refills | Status: DC

## 2022-02-10 MED ORDER — ALBUTEROL SULFATE (2.5 MG/3ML) 0.083% IN NEBU: 2.5000 mg | INHALATION_SOLUTION | RESPIRATORY_TRACT | 12 refills | Status: DC | PRN

## 2022-02-10 MED ORDER — FLUTICASONE PROPIONATE 50 MCG/ACT NA SUSP: 1.0000 | Freq: Every day | NASAL | 11 refills | Status: DC | PRN

## 2022-02-10 NOTE — Assessment & Plan Note (Signed)
Well-controlled on PPI. Refilled today. ?

## 2022-02-10 NOTE — Patient Instructions (Addendum)
Stop Symbicort. Start Breztri 2 puffs Twice daily. Brush tongue and rinse mouth afterwards ?Continue Albuterol inhaler 2 puffs or 3 mL neb every 6 hours as needed for shortness of breath or wheezing. Notify if symptoms persist despite rescue inhaler/neb use. ?Continue flonase 1-2 sprays each nostril daily ?Continue Claritin 10 mg daily for allergies ?Continue singulair 10 mg At bedtime  ?Continue protonix 40 mg daily  ? ?Prednisone taper. 4 tabs for 2 days, then 3 tabs for 2 days, 2 tabs for 2 days, then 1 tab for 2 days, then start daily 10 mg of prednisone ?Restart fasenra injections - we will send a message to the pharmacy to get these restarted and be in touch  ? ?Urinalysis today. ? ?Continue to use CPAP every night, minimum of 4-6 hours a night.  ?Change equipment every 30 days or as directed by DME. Wash your tubing with warm soap and water daily, hang to dry. Wash humidifier portion weekly.  ?Be aware of reduced alertness and do not drive or operate heavy machinery if experiencing this or drowsiness.  ?Exercise encouraged, as tolerated. ?Avoid or decrease alcohol consumption and medications that make you more sleepy, if possible. ?Notify if persistent daytime sleepiness occurs even with consistent use of CPAP. ? ?Bring SD card with you at next visit. ? ?Follow up in 2 weeks with Dr. Craige Cotta or Philis Nettle. If symptoms do not improve or worsen, please contact office for sooner follow up or seek emergency care. ?

## 2022-02-10 NOTE — Assessment & Plan Note (Signed)
See above plan. Adding LAMA to therapy.  ?

## 2022-02-10 NOTE — Progress Notes (Signed)
Reviewed and agree with assessment/plan. ? ? ?Maelyn Berrey, MD ?[facility] ?02/10/2022, 1:01 PM ?Pager:  [phone] ? ?

## 2022-02-10 NOTE — Progress Notes (Signed)
? ?'@Patient'  ID: Kathleen Rodriguez, female    DOB: 09-Nov-1965, 56 y.o.   MRN: [id number] ? ?Chief Complaint  ?Patient presents with  ? Follow-up  ? ? ?Referring provider: ?[name], MD ? ?HPI: ?56 year old female, former smoker followed for severe persistent asthma, COPD, OSA. She is a patient of Dr. Juanetta Gosling and last seen in office 10/29/2020. She has been hospitalized numerous times since we saw her last for asthma exacerbations, most recently 01/16/2022-01/18/2022. Past medical history significant for HTN, GERD, DM II, OA, obesity, anxiety.  ? ?TEST/EVENTS:  ?01/16/2022 CXR: Slight flattening of the hemidiaphragms consistent with hyperinflation.  There is mild interstitial prominence consistent with history of asthma.  No acute process noted. ?03/03/2017 PFTs: FVC 80, FEV1 64, ratio 64, TLC 84, DLCO corrected for alveolar volume 67.  Positive bronchodilator response.  Moderate obstructive airway disease with moderate diffusion defect. ? ?01/16/2022-01/18/2022: Hospitalization for asthma exacerbation with acute respiratory failure requiring BiPAP. Treated with IV steroids and mag. Able to wean to room air. Discharged on prednisone taper.  ? ?02/10/2022: Today-acute visit ?Patient presents today for worsening shortness of breath and wheezing.  She was post be hospital follow-up but began having worsening symptoms after completing prednisone taper 4 days ago.  She has an occasional dry cough.  Denies any URI symptoms.  No hemoptysis, recent weight loss, lower extremity swelling.  She has also run out of her Symbicort since she was discharged.  She has been hospitalized multiple times for acute respiratory failure from asthma exacerbation requiring BiPAP therapy.  She has been off of her [name] for over a year now.  Did feel like she was slightly better when she was on it.  Continues on Singulair and Claritin daily for trigger prevention. ? ?Allergies  ?Allergen Reactions  ? Tomato Hives, Itching and Other (See Comments)  ?   ALSO REACTS TO KETCHUP  ? Latex Itching and Rash  ? Wool Alcohol [Lanolin] Itching  ? ? ?Immunization History  ?Administered Date(s) Administered  ? Influenza,inj,Quad PF,6+ Mos 08/13/2013, 07/27/2015, 07/21/2016, 10/22/2017, 08/01/2018, 06/17/2019, 10/29/2020  ? Moderna Sars-Covid-2 Vaccination 12/17/2019, 01/21/2021  ? Pneumococcal Polysaccharide-23 06/17/2019  ? Tdap 08/14/2013  ? Zoster Recombinat (Shingrix) 12/02/2020, 03/18/2021  ? ? ?Past Medical History:  ?Diagnosis Date  ? Arthritis   ? "knees, lower back; legs, ankles" (01/27/2016)  ? Asthma   ? followed by Dr. Halford Chessman  ? Asthma   ? CHF (congestive heart failure) ([place]) 2016  ? "when I went into a coma"  ? Cocaine abuse ([place])   ? 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 Diabetes mellitus without complication ([place])   ? GERD (gastroesophageal reflux disease)   ? Hypertension   ? "doctor took me off RX in 2016" (01/27/2016)  ? Hypertension   ? Influenza B 01/2013  ? Complicated by multi-organ failure  ? OSA on CPAP "since " 03/20/2013  ? Pneumonia 2016  ? Required emergent intubation   ? asthma exacerbation in 2016  ? Skin ulcer ([place]) secondary to bullous impetigo Marilynn Rail 08/29/2018  ? Sleep apnea   ? Tobacco abuse   ? Upper airway cough syndrome   ? ? ?Tobacco History: ?Social History  ? ?Tobacco Use  ?Smoking Status Former  ? Packs/day: 0.25  ? Years: 21.00  ? Pack years: 5.25  ? Types: Cigarettes  ? Start date: 76  ? Quit date: 01/31/2013  ? Years since quitting: 9.0  ?Smokeless Tobacco Never  ? ?Counseling given: Not Answered ? ? ?Outpatient Medications  Prior to Visit  ?Medication Sig Dispense Refill  ? aspirin 81 MG EC tablet Take 1 tablet (81 mg total) by mouth daily. 30 tablet 3  ? Blood Glucose Monitoring Suppl (ACCU-CHEK GUIDE ME) w/Device KIT Use to check blood sugar once daily. 1 kit 0  ? cloNIDine (CATAPRES) 0.2 MG tablet Take 1 tablet (0.2 mg total) by mouth 2 (two) times daily. For BP and hot flashes 60 tablet 2  ? gabapentin (NEURONTIN) 300 MG capsule  Take 1 capsule (300 mg total) by mouth 2 (two) times daily. (Patient taking differently: Take 300 mg by mouth 3 (three) times daily.) 60 capsule 0  ? glucose blood (ACCU-CHEK GUIDE) test strip Use as instructed daily before breakfast. 100 each 2  ? ibuprofen (ADVIL) 800 MG tablet Take 800 mg by mouth every 8 (eight) hours as needed (for pain).    ? loratadine (CLARITIN) 10 MG tablet Take 1 tablet (10 mg total) by mouth daily. 30 tablet 0  ? metFORMIN (GLUCOPHAGE) 500 MG tablet TAKE 1 TABLET BY MOUTH 2 TIMES DAILY WITH A MEAL. 60 tablet 0  ? fluticasone (FLONASE) 50 MCG/ACT nasal spray Place 1 spray into both nostrils daily as needed for allergies. (Patient taking differently: Place 1-2 sprays into both nostrils daily.) 16 g 11  ? pantoprazole (PROTONIX) 40 MG tablet Take 1 tablet (40 mg total) by mouth daily at 12 noon. (Patient taking differently: Take 40 mg by mouth daily.) 30 tablet 0  ? albuterol (PROAIR HFA) 108 (90 Base) MCG/ACT inhaler TAKE 2 PUFFS BY MOUTH EVERY 6 HOURS AS NEEDED FOR WHEEZE OR SHORTNESS OF BREATH (Patient not taking: Reported on 02/10/2022) 8.5 each 3  ? budesonide-formoterol (SYMBICORT) 160-4.5 MCG/ACT inhaler Inhale 2 puffs into the lungs 2 (two) times daily. (Patient not taking: Reported on 02/10/2022) 10.2 g 12  ? ipratropium-albuterol (DUONEB) 0.5-2.5 (3) MG/3ML SOLN Use 1 vial by nebulization 3 (three) times daily. (Patient not taking: Reported on 02/10/2022) 360 mL 1  ? montelukast (SINGULAIR) 10 MG tablet Take 1 tablet (10 mg total) by mouth at bedtime. (Patient not taking: Reported on 02/10/2022) 30 tablet 0  ? predniSONE (DELTASONE) 10 MG tablet Takes 6 tablets for 1 days, then 5 tablets for 1 days, then 4 tablets for 1 days, then 3 tablets for 1 days, then 2 tabs for 1 days, then 1 tab for 1 days, and then stop. (Patient not taking: Reported on 02/10/2022) 21 tablet 0  ? ?No facility-administered medications prior to visit.  ? ? ? ?Review of Systems:  ? ?Constitutional: No weight loss  or gain, night sweats, fevers, chills, fatigue, or lassitude. ?HEENT: No headaches, difficulty swallowing, tooth/dental problems, or sore throat. No sneezing, itching, ear ache, nasal congestion, or post nasal drip ?CV:  No chest pain, orthopnea, PND, swelling in lower extremities, anasarca, dizziness, palpitations, syncope ?Resp: +shortness of breath with exertion; wheezing; minimal dry cough. No excess mucus or change in color of mucus. No hemoptysis.  No chest wall deformity ?GI:  No heartburn, indigestion, abdominal pain, nausea, vomiting, diarrhea, change in bowel habits, loss of appetite, bloody stools.  ?GU: No dysuria, change in color of urine, urgency or frequency.  No flank pain, no hematuria  ?Skin: No rash, lesions, ulcerations ?MSK:  No joint pain or swelling.  No decreased range of motion.  No back pain. ?Neuro: No dizziness or lightheadedness.  ?Psych: No depression or anxiety. Mood stable.  ? ? ? ?Physical Exam: ? ?BP 120/86 (BP Location: Right Arm, Cuff  Size: Normal)   Pulse 74   Ht 5' 5.5" (1.664 m)   Wt 209 lb (94.8 kg)   SpO2 100%   BMI 34.25 kg/m?  ? ?GEN: Pleasant, interactive, well-appearing; obese; in no acute distress. ?HEENT:  Normocephalic and atraumatic. PERRLA. Sclera white. Nasal turbinates pink, moist and patent bilaterally. No rhinorrhea present. Oropharynx pink and moist, without exudate or edema. No lesions, ulcerations, or postnasal drip.  ?NECK:  Supple w/ fair ROM. No JVD present. Normal carotid impulses w/o bruits. Thyroid symmetrical with no goiter or nodules palpated. No lymphadenopathy.   ?CV: RRR, no m/r/g, no peripheral edema. Pulses intact, +2 bilaterally. No cyanosis, pallor or clubbing. ?PULMONARY:  Unlabored, regular breathing. Scattered wheezes bilaterally A&P. No accessory muscle use. No dullness to percussion. ?GI: BS present and normoactive. Soft, non-tender to palpation. No organomegaly or masses detected. No CVA tenderness. ?MSK: No erythema, warmth or  tenderness. Cap refil <2 sec all extrem. No deformities or joint swelling noted.  ?Neuro: A/Ox3. No focal deficits noted.   ?Skin: Warm, no lesions or rashe ?Psych: Normal affect and behavior. Judgement and though

## 2022-02-10 NOTE — Assessment & Plan Note (Addendum)
Unable to obtain download today. Pt reported compliance some nights. Advised she bring SD card so we can assess usage/control at her follow up.  ?

## 2022-02-10 NOTE — Assessment & Plan Note (Signed)
Multiple hospitalizations requiring BiPAP over the past year. Recurrent exacerbation today after completing prednisone taper. Will treat with another taper and then start daily prednisone 10 mg until we can get her started back on Fasenra. Eosinophils 400. Hopefully at that point, we can wean off daily use. Message sent to pharmacy to restart therapy. Step up to triple therapy with Brezti; samples provided today. Continue singulair and claritin for trigger prevention. Change duonebs to albuterol nebs with the addition of LAMA.  ? ?Patient Instructions  ?Stop Symbicort. Start Breztri 2 puffs Twice daily. Brush tongue and rinse mouth afterwards ?Continue Albuterol inhaler 2 puffs or 3 mL neb every 6 hours as needed for shortness of breath or wheezing. Notify if symptoms persist despite rescue inhaler/neb use. ?Continue flonase 1-2 sprays each nostril daily ?Continue Claritin 10 mg daily for allergies ?Continue singulair 10 mg At bedtime  ?Continue protonix 40 mg daily  ? ?Prednisone taper. 4 tabs for 2 days, then 3 tabs for 2 days, 2 tabs for 2 days, then 1 tab for 2 days, then start daily 10 mg of prednisone ?Restart fasenra injections - we will send a message to the pharmacy to get these restarted and be in touch  ? ?Urinalysis today. ? ?Continue to use CPAP every night, minimum of 4-6 hours a night.  ?Change equipment every 30 days or as directed by DME. Wash your tubing with warm soap and water daily, hang to dry. Wash humidifier portion weekly.  ?Be aware of reduced alertness and do not drive or operate heavy machinery if experiencing this or drowsiness.  ?Exercise encouraged, as tolerated. ?Avoid or decrease alcohol consumption and medications that make you more sleepy, if possible. ?Notify if persistent daytime sleepiness occurs even with consistent use of CPAP. ? ?Bring SD card with you at next visit. ? ?Follow up in 2 weeks with Dr. Craige Cotta or Philis Nettle. If symptoms do not improve or worsen, please contact  office for sooner follow up or seek emergency care. ? ? ?

## 2022-02-10 NOTE — Assessment & Plan Note (Addendum)
Well-controlled on current regimen. Refills sent today. ?

## 2022-02-11 ENCOUNTER — Other Ambulatory Visit (HOSPITAL_COMMUNITY): Payer: Self-pay

## 2022-02-11 ENCOUNTER — Telehealth: Payer: Self-pay | Admitting: Pharmacist

## 2022-02-11 DIAGNOSIS — J4551 Severe persistent asthma with (acute) exacerbation: Secondary | ICD-10-CM

## 2022-02-11 NOTE — Telephone Encounter (Addendum)
Received notification from [facility] MEDICAID regarding a prior authorization for [facility]. Authorization has been APPROVED from 02/11/2022 to 08/13/2022. Approval letter sent to scan center. ? ?Per test claim, Berkley Harvey has not adjudicated at pharmacy level. Will try again on Monday. ? ?Authorization # [id number] ?

## 2022-02-11 NOTE — Progress Notes (Signed)
Please notify patient no evidence of UTI on urinalysis.

## 2022-02-11 NOTE — Telephone Encounter (Signed)
Submitted a Prior Authorization request to [place] for [facility] via CoverMyMeds. Will update once we receive a response. ? ? ?Key: [id number] ?

## 2022-02-11 NOTE — Telephone Encounter (Signed)
Received notification from CMA that patient will be starting Fasenra. ? ?We have attempted to start Fasenra for patient since 2020 - she never started injections so I am unsure how she reported doing better on therapy ? ?Will restart BIV for Fasenra ?Dose: 30mg  SQ at Weeks 0, 4, 8, then every 8 weeks thereafter ? ? , PharmD, MPH, BCPS ?Clinical Pharmacist (Rheumatology and Pulmonology) ?

## 2022-02-15 ENCOUNTER — Telehealth: Payer: Self-pay | Admitting: Nurse Practitioner

## 2022-02-15 ENCOUNTER — Telehealth: Payer: Self-pay | Admitting: Pharmacy Technician

## 2022-02-15 ENCOUNTER — Other Ambulatory Visit (HOSPITAL_COMMUNITY): Payer: Self-pay

## 2022-02-15 NOTE — Telephone Encounter (Signed)
Routing to both prior auth team as well as Katie to see which med would be preferred in place to put pt on. ?

## 2022-02-15 NOTE — Telephone Encounter (Signed)
PA is required for [facility]. PA has been submitted.

## 2022-02-15 NOTE — Telephone Encounter (Signed)
Patient Advocate Encounter ? ?Received notification from PATIENT that prior authorization for BREZTRI is required. ?  ?PA submitted on 4.25.23 ?Key [id number] ?Status is pending ?  ?[facility] will continue to follow ? ?Conley Pawling R Jontae Sonier, CPhT ?Patient Advocate ?[facility] ?Phone: [phone] ?Fax:  [phone] ? ?

## 2022-02-16 NOTE — Telephone Encounter (Signed)
Separate encounter has been created for the PA so closing this encounter so we can look at the PA encounter. ?

## 2022-02-17 ENCOUNTER — Other Ambulatory Visit (HOSPITAL_COMMUNITY): Payer: Self-pay

## 2022-02-17 NOTE — Telephone Encounter (Signed)
Received a fax regarding Prior Authorization from [facility] for BREZTRI 160 MCG. Authorization has been DENIED because Per your health plan's criteria, this drug is covered if you meet the following:One of the following: ?(1) You have failed one preferred drug as confirmed by claims history or submission of medical records: The preferred drugs: brand Advair Diskus and Dulera. ?(2) You cannot use one preferred drug (please specify contraindication or intolerance).. ? ? ?

## 2022-02-17 NOTE — Telephone Encounter (Signed)
Expedited Appeal submitted to plan. Will update once we receive a response. ? ?Phone# [phone] ?

## 2022-02-18 ENCOUNTER — Other Ambulatory Visit (HOSPITAL_COMMUNITY): Payer: Self-pay

## 2022-02-18 NOTE — Progress Notes (Signed)
Spoke with pt and notified of results per [employer]. Pt verbalized understanding and denied any questions. ?

## 2022-02-18 NOTE — Telephone Encounter (Signed)
Per test claim, pts copay is $4. First two loading doses are not covered and would require an EtB request.  ?

## 2022-02-23 NOTE — Telephone Encounter (Signed)
Please run EtB request for patient's Harrington Challenger. We need this for [facility] to fill rx and courier to clinic ? ?Chesley Mires, PharmD, MPH, BCPS, CPP ?Clinical Pharmacist (Rheumatology and Pulmonology) ?

## 2022-02-23 NOTE — Telephone Encounter (Signed)
Called [employer] and spoke with rep regarding loading dose authorization. Per rep it appears that initial auth was intended to include loading dose, however there was ultimately a days supply discrepancy with the way the Auth was entered. Submitted second auth request verbally. New request will (hopefully) be approved separately or previous Berkley Harvey will be addended. Rep states there is a possibility that the second auth request will be cancelled as a duplicate, and if so to call in and reference my call with her. ? ?Spec Pharm PA line: [phone] ?Ref# [id number] ?Name: Fleet Contras ? ? ?Will await determination. ?

## 2022-02-23 NOTE — Telephone Encounter (Signed)
Patient did not call back. Closing encounter.

## 2022-02-23 NOTE — Progress Notes (Deleted)
Patient ID: TELITHA PLATH, female   DOB: [birth year], 56 y.o.   MRN: [id number]  After hospitalization 3/26-3/28/2023 Brief/Interim Summary: 57 y.o. female past medical history significant for severe persistent asthma which the patient says has required intubation in the past more than a year ago hospitalized in December admitted to the ICU on BiPAP and Precedex due to her severe anxiety obstructive sleep apnea alcohol and polysubstance abuse comes in for shortness of breath   Discharge Diagnoses:  Principal Problem:   Acute asthma exacerbation Active Problems:   Diabetes mellitus without complication (HCC)   OSA (obstructive sleep apnea)   Essential hypertension   Acute respiratory failure with hypoxia (HCC)   Acute respiratory failure with hypoxia secondary to acute asthma exacerbation: She was placed on BiPAP started on IV steroids inhalers and she was given IV mag she remained afebrile with no leukocytosis chest x-ray showed no infiltrates. We were able to wean her to room air she will continue home on steroid taper.  Diabetes mellitus type 2 without complications: With an A1c of 5.6. She will continue metformin at home.  Struct of sleep apnea: Continue BiPAP at night.  Essential hypertension: Continue clonidine.  Vaginal candidiasis: She was started on Diflucan which she will continue as an outpatient is likely due to steroids.

## 2022-02-24 ENCOUNTER — Ambulatory Visit: Payer: Medicaid Other | Admitting: Physician Assistant

## 2022-02-24 ENCOUNTER — Other Ambulatory Visit (HOSPITAL_COMMUNITY): Payer: Self-pay

## 2022-02-24 MED ORDER — FASENRA PEN 30 MG/ML ~~LOC~~ SOAJ
SUBCUTANEOUS | 0 refills | Status: DC
  Filled 2022-02-24 – 2022-02-28 (×2): qty 1, 28d supply, fill #0

## 2022-02-24 NOTE — Telephone Encounter (Signed)
Rx for Fasenra sent to [facility] to be couriered to clinic before new start visit on 03/02/22 ? ?Chesley Mires, PharmD, MPH, BCPS, CPP ?Clinical Pharmacist (Rheumatology and Pulmonology) ?

## 2022-02-24 NOTE — Telephone Encounter (Signed)
Insurance should now cover loading doses after a DUR override is placed on the "HIGH DOSE" warnings. ?

## 2022-02-25 ENCOUNTER — Ambulatory Visit: Payer: Medicaid Other | Admitting: Pulmonary Disease

## 2022-02-28 ENCOUNTER — Other Ambulatory Visit (HOSPITAL_COMMUNITY): Payer: Self-pay

## 2022-02-28 NOTE — Telephone Encounter (Signed)
Delivery instructions have been updated in [employer], medication will be couriered to [facility] by 5/10. ? ?

## 2022-03-01 NOTE — Telephone Encounter (Signed)
Fasenra delivered to clinic and placed in Pyxis. ? ?Chesley Mires, PharmD, MPH, BCPS, CPP ?Clinical Pharmacist (Rheumatology and Pulmonology) ?

## 2022-03-02 ENCOUNTER — Other Ambulatory Visit: Payer: Medicaid Other | Admitting: Pharmacist

## 2022-03-02 NOTE — Telephone Encounter (Signed)
Patient was no-show to [place] new start today ? ?Chesley Mires, PharmD, MPH, BCPS, CPP ?Clinical Pharmacist (Rheumatology and Pulmonology) ?

## 2022-03-03 ENCOUNTER — Other Ambulatory Visit (HOSPITAL_COMMUNITY): Payer: Self-pay

## 2022-03-03 NOTE — Telephone Encounter (Signed)
Patient scheduled for [name] new start on 03/09/22. ? ?Knox Saliva, PharmD, MPH, BCPS, CPP ?Clinical Pharmacist (Rheumatology and Pulmonology) ?

## 2022-03-09 ENCOUNTER — Telehealth: Payer: Self-pay | Admitting: Pulmonary Disease

## 2022-03-09 ENCOUNTER — Other Ambulatory Visit: Payer: Medicaid Other | Admitting: Pharmacist

## 2022-03-09 NOTE — Telephone Encounter (Signed)
Patient r/s Kathleen Rodriguez new start to 03/14/22 d/t transportation concerns ? ?Knox Saliva, PharmD, MPH, BCPS, CPP ?Clinical Pharmacist (Rheumatology and Pulmonology) ?

## 2022-03-09 NOTE — Telephone Encounter (Signed)
Pt called in requesting a letter stating that she needs a ground level apartment. Pt states that her apartment complex has agreed to move her to a 1st floor apartment if she could supply them with the letter. Pt states that going up a lot of steps is extremely tiring for her, and when she goes up, and down she is gasping for air    ?

## 2022-03-10 NOTE — Telephone Encounter (Signed)
Called and spoke with patient. She stated that she is currently living on the 3rd floor of her apartment building. She is constantly out of breath and having asthma attacks after going up the stairs. EMS has been to her current apartment several times due to her SOB.   The apartment complex has an apartment on the 1st floor for her but they are requiring a letter stating why she needs the apartment so they can change her current lease.   She would like to pickup the letter on 03/14/22 after her injection appointment.   Dr. Craige Cotta, are you ok with this letter? Thanks!

## 2022-03-11 ENCOUNTER — Encounter: Payer: Self-pay | Admitting: Pulmonary Disease

## 2022-03-11 NOTE — Telephone Encounter (Signed)
I called the patient and she is aware and she will pick it up next week. Nothing further needed.

## 2022-03-11 NOTE — Telephone Encounter (Signed)
Letter printed and signed.  Can be found in my office on 2nd floor at [employer] location.

## 2022-03-14 ENCOUNTER — Other Ambulatory Visit: Payer: Self-pay | Admitting: Obstetrics and Gynecology

## 2022-03-14 ENCOUNTER — Other Ambulatory Visit: Payer: Medicaid Other | Admitting: Pharmacist

## 2022-03-14 NOTE — Telephone Encounter (Signed)
Patient called to cancel her Harrington Challenger new start visit. She lost her son last Friday and is grieving. Understand need to delay at this time due to grieving. Will follow-up with her in several weeks  Chesley Mires, PharmD, MPH, BCPS, CPP Clinical Pharmacist (Rheumatology and Pulmonology)

## 2022-03-14 NOTE — Patient Outreach (Signed)
Care Coordination  03/14/2022  Kathleen Rodriguez [birth year] [id number]  RNCM called patient at scheduled time.  Patient answered and stated her 57 year old son died on 2023-02-12.  RNCM unable to complete phone appointment today.  RNCM offered SW services and patient declined at this time stating she has in home therapy three times a week.  RNCM rescheduled appointment at patient request.   [name] RN, BSN [place]  [facility] Care Management Coordinator - Managed [place] High Risk [phone]

## 2022-03-25 ENCOUNTER — Other Ambulatory Visit (HOSPITAL_COMMUNITY): Payer: Self-pay

## 2022-03-29 ENCOUNTER — Other Ambulatory Visit (HOSPITAL_COMMUNITY): Payer: Self-pay

## 2022-03-30 ENCOUNTER — Other Ambulatory Visit (HOSPITAL_COMMUNITY): Payer: Self-pay

## 2022-04-04 ENCOUNTER — Telehealth: Payer: Self-pay | Admitting: Nurse Practitioner

## 2022-04-04 NOTE — Telephone Encounter (Signed)
Attempted to contact patient regarding Harrington Challenger new start. Patient answered phone but was unable to discuss. Said she would call back today.  Valeda Malm, Pharm.D. PGY-1 Pharmacy Resident 04/04/2022 12:54 PM

## 2022-04-05 NOTE — Telephone Encounter (Signed)
Contacted patient regarding staring Fasenra. New start appointment scheduled for 04/12/22 at 2:20. Patient reports she has coordinated transportation.   Valeda Malm, Pharm.D. PGY-1 Pharmacy Resident 04/05/2022 2:13 PM

## 2022-04-06 NOTE — Telephone Encounter (Signed)
Routing to pharmacy team for clarification on this for pt.

## 2022-04-06 NOTE — Telephone Encounter (Signed)
Spoke with patient yesterday and is scheduled to start on 04/12/22. Closing encounter.

## 2022-04-12 ENCOUNTER — Other Ambulatory Visit: Payer: Medicaid Other | Admitting: Pharmacist

## 2022-04-12 NOTE — Telephone Encounter (Signed)
Patient no-show to [facility] new start visit. Left VM for patient.  Chesley Mires, PharmD, MPH, BCPS, CPP Clinical Pharmacist (Rheumatology and Pulmonology)

## 2022-04-12 NOTE — Progress Notes (Deleted)
HPI Patient presents today to [facility] to see pharmacy team for [facility] new start.  Past medical history includes asthma-COPD overlap, allergic rhinitis, HTN, GERD, DM, osteoarthritis.   Number of hospitalizations in past year: 3 Number of COPD/asthma exacerbations in past year: 5  Respiratory Medications Current regimen: albuterol, Breztri, Singulair, Claritin, prednisone  Patient reports transportation must be coordinated for office visits  OBJECTIVE Allergies  Allergen Reactions   Tomato Hives, Itching and Other (See Comments)    ALSO REACTS TO KETCHUP   Latex Itching and Rash   Wool Alcohol [Lanolin] Itching    Outpatient Encounter Medications as of 04/12/2022  Medication Sig Note   albuterol (PROAIR HFA) 108 (90 Base) MCG/ACT inhaler TAKE 2 PUFFS BY MOUTH EVERY 6 HOURS AS NEEDED FOR WHEEZE OR SHORTNESS OF BREATH (Patient not taking: Reported on 02/10/2022) 01/16/2022: PROVIDER- The patient said she is OUT of this medication and needs a NEW Rx, please   albuterol (PROVENTIL) (2.5 MG/3ML) 0.083% nebulizer solution Take 3 mLs (2.5 mg total) by nebulization every 4 (four) hours as needed for wheezing or shortness of breath.    aspirin 81 MG EC tablet Take 1 tablet (81 mg total) by mouth daily.    Benralizumab (FASENRA PEN) 30 MG/ML SOAJ Inject Fasenra 89m into skin at Week 0. Courier to pulm: [address], [place] [id number] Appt on 03/02/22    Blood Glucose Monitoring Suppl (ACCU-CHEK GUIDE ME) w/Device KIT Use to check blood sugar once daily.    Budeson-Glycopyrrol-Formoterol (BREZTRI AEROSPHERE) 160-9-4.8 MCG/ACT AERO Inhale 2 puffs into the lungs in the morning and at bedtime.    cloNIDine (CATAPRES) 0.2 MG tablet Take 1 tablet (0.2 mg total) by mouth 2 (two) times daily. For BP and hot flashes    fluticasone (FLONASE) 50 MCG/ACT nasal spray Place 1 spray into both nostrils daily as needed for allergies.    gabapentin (NEURONTIN) 300 MG capsule Take 1  capsule (300 mg total) by mouth 2 (two) times daily. (Patient taking differently: Take 300 mg by mouth 3 (three) times daily.)    glucose blood (ACCU-CHEK GUIDE) test strip Use as instructed daily before breakfast.    ibuprofen (ADVIL) 800 MG tablet Take 800 mg by mouth every 8 (eight) hours as needed (for pain).    loratadine (CLARITIN) 10 MG tablet Take 1 tablet (10 mg total) by mouth daily.    metFORMIN (GLUCOPHAGE) 500 MG tablet TAKE 1 TABLET BY MOUTH 2 TIMES DAILY WITH A MEAL.    montelukast (SINGULAIR) 10 MG tablet Take 1 tablet (10 mg total) by mouth at bedtime.    pantoprazole (PROTONIX) 40 MG tablet Take 1 tablet (40 mg total) by mouth daily.    predniSONE (DELTASONE) 10 MG tablet 4 tabs for 2 days, then 3 tabs for 2 days, 2 tabs for 2 days, then start daily 10 mg    predniSONE (DELTASONE) 10 MG tablet Take 1 tablet (10 mg total) by mouth daily with breakfast.    No facility-administered encounter medications on file as of 04/12/2022.     Immunization History  Administered Date(s) Administered   Influenza,inj,Quad PF,6+ Mos 08/13/2013, 07/27/2015, 07/21/2016, 10/22/2017, 08/01/2018, 06/17/2019, 10/29/2020   Moderna Sars-Covid-2 Vaccination 12/17/2019, 01/21/2021   PNEUMOCOCCAL CONJUGATE-20 03/02/2022   Pneumococcal Polysaccharide-23 06/17/2019   Tdap 08/14/2013   Zoster Recombinat (Shingrix) 12/02/2020, 03/18/2021     PFTs    Latest Ref Rng & Units 03/03/2017    9:18 AM  PFT Results  FVC-Pre L  2.36   FVC-Predicted Pre % 77   FVC-Post L 2.45   FVC-Predicted Post % 80   Pre FEV1/FVC % % 58   Post FEV1/FCV % % 64   FEV1-Pre L 1.38   FEV1-Predicted Pre % 56   FEV1-Post L 1.57   DLCO uncorrected ml/min/mmHg 18.00   DLCO UNC% % 68   DLCO corrected ml/min/mmHg 17.74   DLCO COR %Predicted % 67   DLVA Predicted % 98   TLC L 4.47   TLC % Predicted % 84   RV % Predicted % 108      Eosinophils Most recent blood eosinophil count was 400 cells/microL taken on 01/16/22.    IgE: 446 on 07/28/17   Assessment   Biologics training for benralizumab [name])  Goals of therapy: Mechanism of action: humanized afucosylated, monoclonal antibody (IgG1, kappa) that binds to the alpha subunit of the interleukin-5 receptor. IL-5 is the major cytokine responsible for the growth and differentiation, recruitment, activation, and survival of eosinophils (a cell type associated with inflammation and an important component in the pathogenesis of asthma) Reviewed that [name] is add-on medication and patient must continue maintenance inhaler regimen. Response to therapy: may take 4 months to determine efficacy. Discussed that patients generally feel improvement sooner than 4 months.  Side effects: antibody development (13%), headache (8%), pharyngitis (5%), injection site reaction (2.2%)  Dose: 30 mg subcutaneously at Week 0, Week 4, Week 8, then 45m every 8 weeks thereafter  Administration/Storage:   Reviewed administration sites of thigh or abdomen (at least 2-3 inches away from abdomen). Reviewed the upper arm is only appropriate if caregiver is administering injection  Do not shake the pen/syringe as this could lead to product foaming or precipitation. \ Reviewed storage of medication in refrigerator. Reviewed that FBerna Buecan be stored at room temperature in unopened carton for up to 14 days.  Side effects: headache (19%), injection site reaction (7-15%), antibody development (6%), backache (5%), fatigue (5%)  Access: Approval of Fasenra through: insurance Patient enrolled into copay card program to help with copay assistance.   Patient self-administered Fasenra 355mmL in *** using sample Fasenra 3066mL autoinjector pen NDC: *** Lot: *** Expiration: ***  Patient monitored for 30 minutes for adverse reaction.  Patient tolerated ***. Injection site checked and no ***.  Medication Reconciliation  A drug regimen assessment was performed, including review of  allergies, interactions, disease-state management, dosing and immunization history. Medications were reviewed with the patient, including name, instructions, indication, goals of therapy, potential side effects, importance of adherence, and safe use.  Drug interaction(s): ***  Immunizations  Patient is indicated for the influenzae, pneumonia, and shingles vaccinations. Patient has received *** COVID19 vaccines.   PLAN Continue Fasenra 71m38m Week 4 on 05/10/22 and Week 8 on 06/07/22. Then continue Fasenra 71mg63mry 8 weeks thereafter starting on 08/02/22. Rx sent to: [facility]: [phone] provided with pharmacy phone number and advised to call later this week to schedule shipment to home. Patient provided with copay card information to provide to pharmacy if quoted copay exceeds $5 per month. Continue maintenance asthma regimen of: albuterol, Breztri, Singulair, Claritin, prednisone   All questions encouraged and answered.  Instructed patient to reach out with any further questions or concerns.  Thank you for allowing pharmacy to participate in this patient's care.  This appointment required *** minutes of patient care (this includes precharting, chart review, review of results, face-to-face care, etc.).

## 2022-04-13 ENCOUNTER — Other Ambulatory Visit: Payer: Self-pay | Admitting: Obstetrics and Gynecology

## 2022-04-13 NOTE — Patient Outreach (Signed)
Care Coordination  04/13/2022  Kathleen Rodriguez 1966-06-10 [id number]   RNCM called patient at scheduled time.  Patient's husband answered phone and stated [name]  had stepped out and should be back momentarily.  He gave me a cell phone number which I called and there was no answer and unable to leave a message. No return call received. RNCM rescheduled appointment.  [name] RN, BSN [place]  [facility], production - Managed Medicaid High Risk [phone].

## 2022-04-14 NOTE — Telephone Encounter (Signed)
ATC pt to reschedule Fasenra new start. LVM requesting return call.   Valeda Malm, Pharm.D. PGY-1 Ambulatory Care Pharmacy Resident 04/14/2022 3:05 PM

## 2022-04-20 ENCOUNTER — Other Ambulatory Visit (HOSPITAL_COMMUNITY): Payer: Self-pay

## 2022-04-27 ENCOUNTER — Other Ambulatory Visit (HOSPITAL_COMMUNITY): Payer: Self-pay

## 2022-04-28 ENCOUNTER — Other Ambulatory Visit: Payer: Medicaid Other | Admitting: Pharmacist

## 2022-04-28 NOTE — Telephone Encounter (Signed)
Thank you for making me aware.

## 2022-04-28 NOTE — Telephone Encounter (Signed)
Patient no-show to [facility] new start visit again. This was third no-show. She has multiple barriers to care including transportation and is in hte middle of moving. She also has recently lost her son. My last conversation with her included her saying that she has a lot that she is juggling.  Will route to provider and stop outreach.  Chesley Mires, PharmD, MPH, BCPS, CPP Clinical Pharmacist (Rheumatology and Pulmonology)

## 2022-04-29 ENCOUNTER — Emergency Department (HOSPITAL_COMMUNITY)
Admission: EM | Admit: 2022-04-29 | Discharge: 2022-04-29 | Discharge status: Against Medical Advice | Payer: Medicaid Other

## 2022-04-29 DIAGNOSIS — R55 Syncope and collapse: Secondary | ICD-10-CM | POA: Diagnosis not present

## 2022-04-29 DIAGNOSIS — I959 Hypotension, unspecified: Secondary | ICD-10-CM | POA: Diagnosis not present

## 2022-04-29 NOTE — Addendum Note (Signed)
Addended by: Murrell Redden on: 04/29/2022 12:02 PM   Modules accepted: Orders

## 2022-04-29 NOTE — ED Triage Notes (Signed)
[place] [facility] [place] c/o a syncopal episode right after giving plasma.     90 122/80 15 108 CBG 98% 500 NS

## 2022-05-08 ENCOUNTER — Other Ambulatory Visit: Payer: Self-pay | Admitting: Family Medicine

## 2022-05-10 NOTE — Telephone Encounter (Signed)
Requested Prescriptions  Pending Prescriptions Disp Refills  . gabapentin (NEURONTIN) 300 MG capsule [Pharmacy Med Name: GABAPENTIN 300 MG CAPSULE] 60 capsule 0    Sig: TAKE 1 CAPSULE BY MOUTH TWICE A DAY     Neurology: Anticonvulsants - gabapentin Failed - 05/08/2022  8:05 PM      Failed - Valid encounter within last 12 months    Recent Outpatient Visits          1 year ago Essential hypertension   [facility] Storm Frisk, MD   1 year ago Mixed stress and urge urinary incontinence   [facility] Hoy Register, MD   1 year ago Annual physical exam   [facility] [place], Odette Horns, MD   2 years ago Acute midline low back pain without sciatica   [facility] [place], Odette Horns, MD   3 years ago Vaginal candidiasis   [facility] [place], Odette Horns, MD             Passed - Cr in normal range and within 360 days    Creatinine, Ser  Date Value Ref Range Status  01/17/2022 0.64 0.44 - 1.00 mg/dL Final         Passed - Completed PHQ-2 or PHQ-9 in the last 360 days

## 2022-05-19 ENCOUNTER — Other Ambulatory Visit: Payer: Self-pay | Admitting: Obstetrics and Gynecology

## 2022-05-19 NOTE — Patient Instructions (Signed)
Hi Kathleen Rodriguez, I am sorry I missed you today, I hope you are doing okay - as a part of your Medicaid benefit, you are eligible for care management and care coordination services at no cost or copay. I was unable to reach you by phone today but would be happy to help you with your health related needs. Please feel free to call me at [phone].  A member of the Managed Medicaid care management team will reach out to you again over the next 30 business  days.   [name] RN, BSN [place]  [facility] production - Managed Medicaid High Risk [phone]

## 2022-05-19 NOTE — Patient Outreach (Signed)
Care Coordination  05/19/2022  Kathleen Rodriguez 02-09-1966 [id number]   Medicaid Managed Care   Unsuccessful Outreach Note  05/19/2022 Name: Kathleen Rodriguez MRN: [id number] DOB: 1966-05-12  Referred by: Hoy Register, MD Reason for referral : High Risk Managed Medicaid (Unsuccessful telephone outreach)   A second unsuccessful telephone outreach was attempted today. The patient was referred to the case management team for assistance with care management and care coordination.   Follow Up Plan: The care management team will reach out to the patient again over the next 30 business days.   [name] RN, BSN [place]  [facility], production - Managed Medicaid High Risk [phone]

## 2022-05-25 IMAGING — DX DG CHEST 2V
3 series · 3 of 3 positions shown · non-contrast
Comparison: March 31, 2021

CLINICAL DATA: Shortness of breath and wheezing.

EXAM:
CHEST - 2 VIEW

[chest lat (1 of 2)]
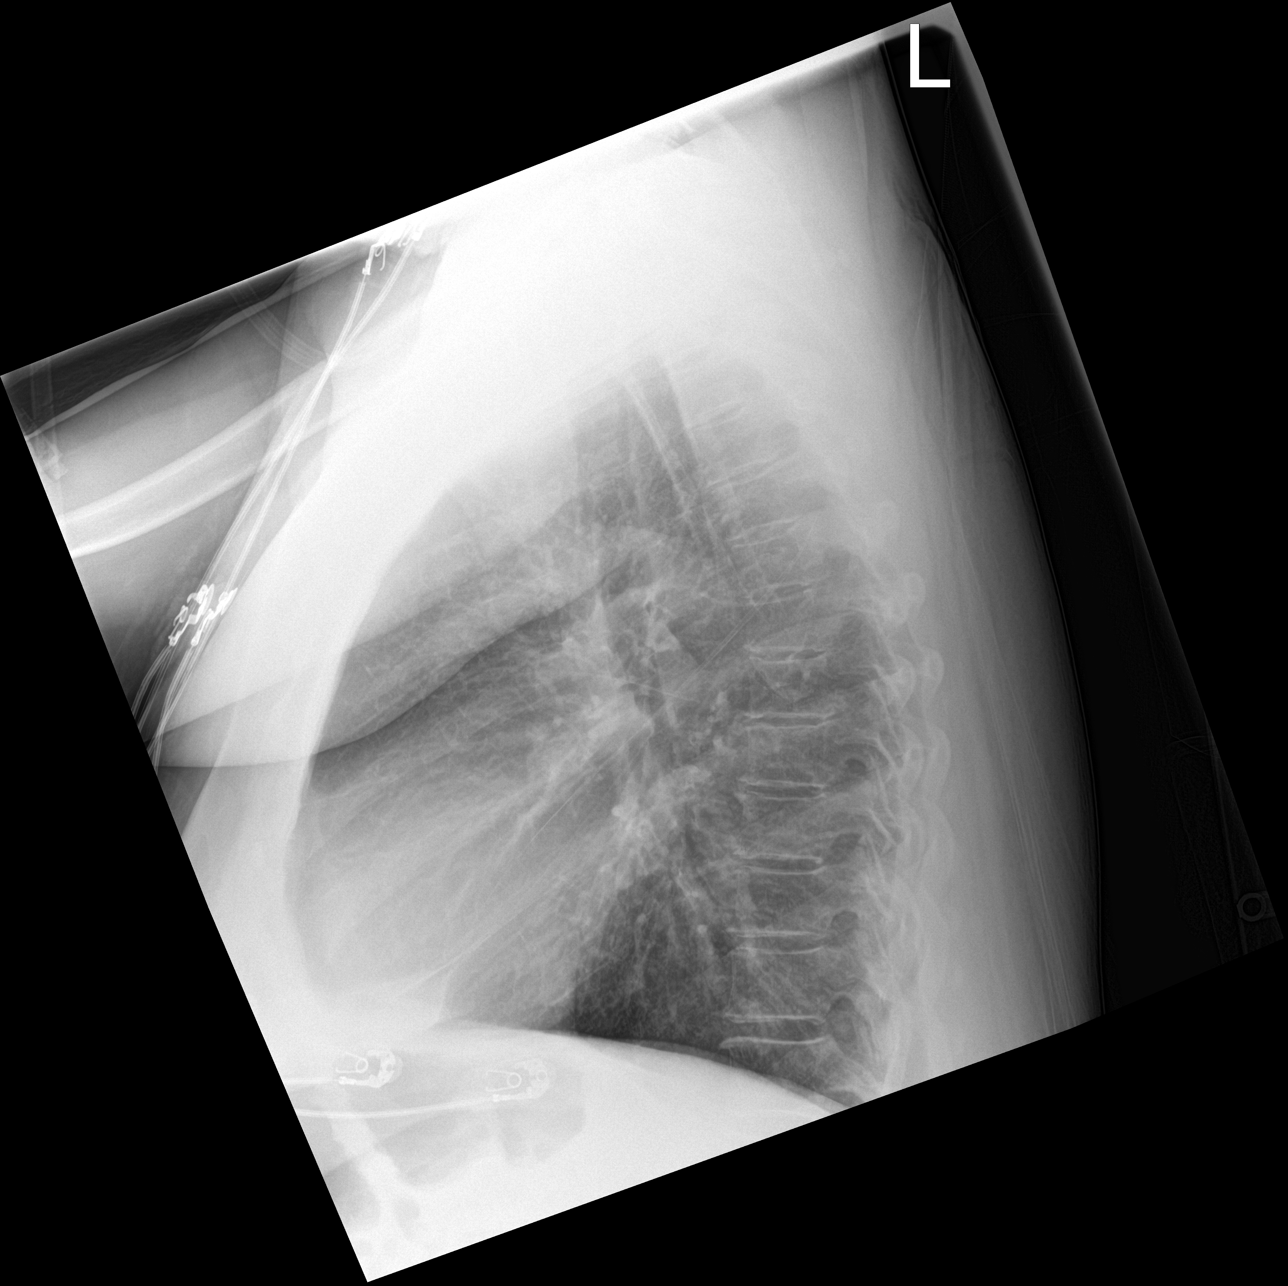

[chest ap]
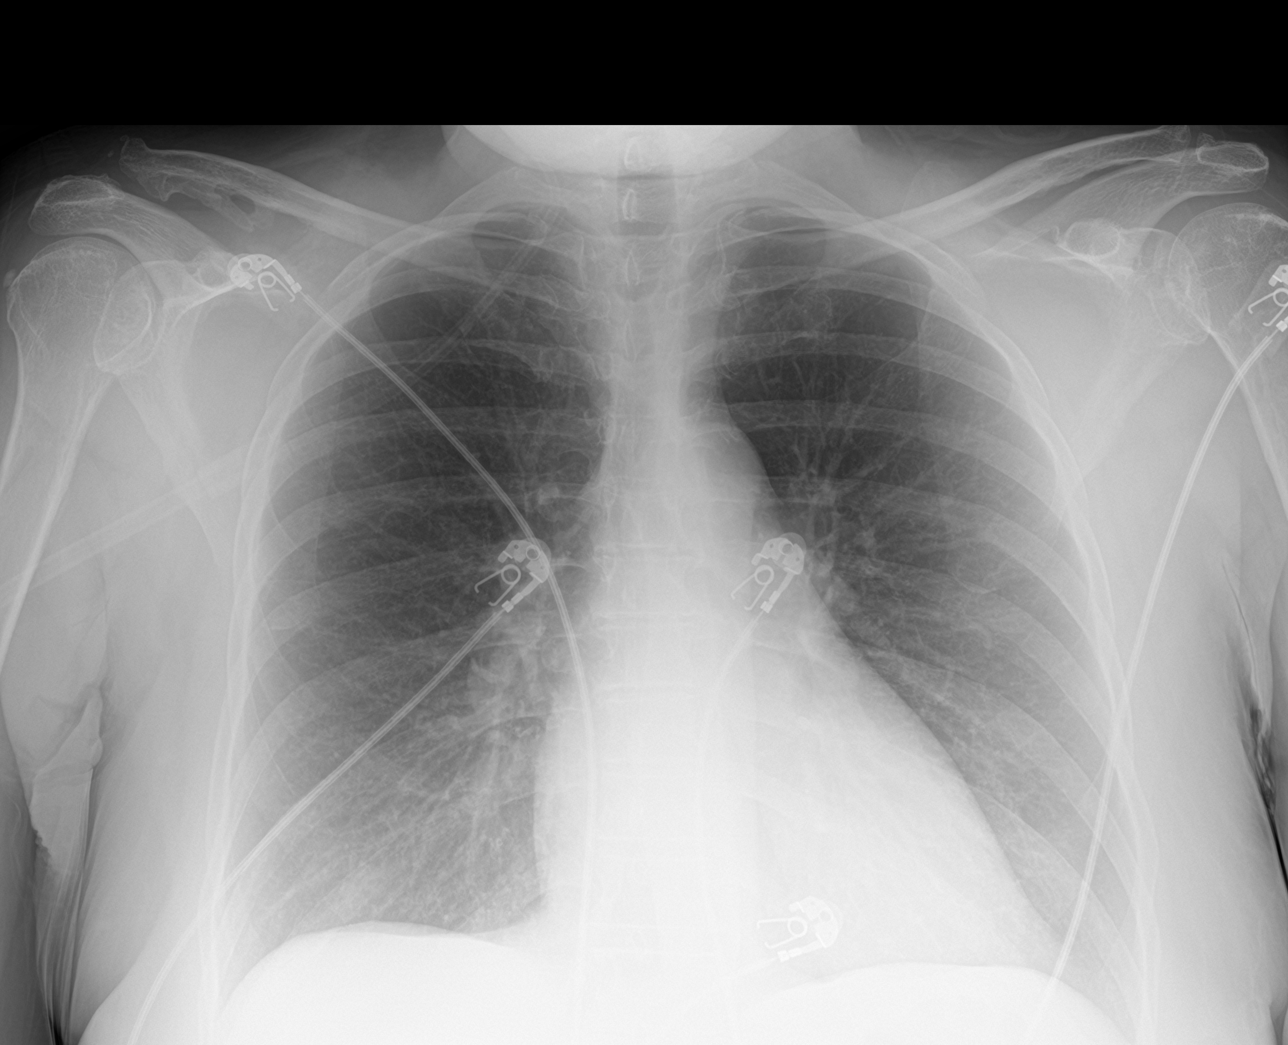

[chest lat (2 of 2)]
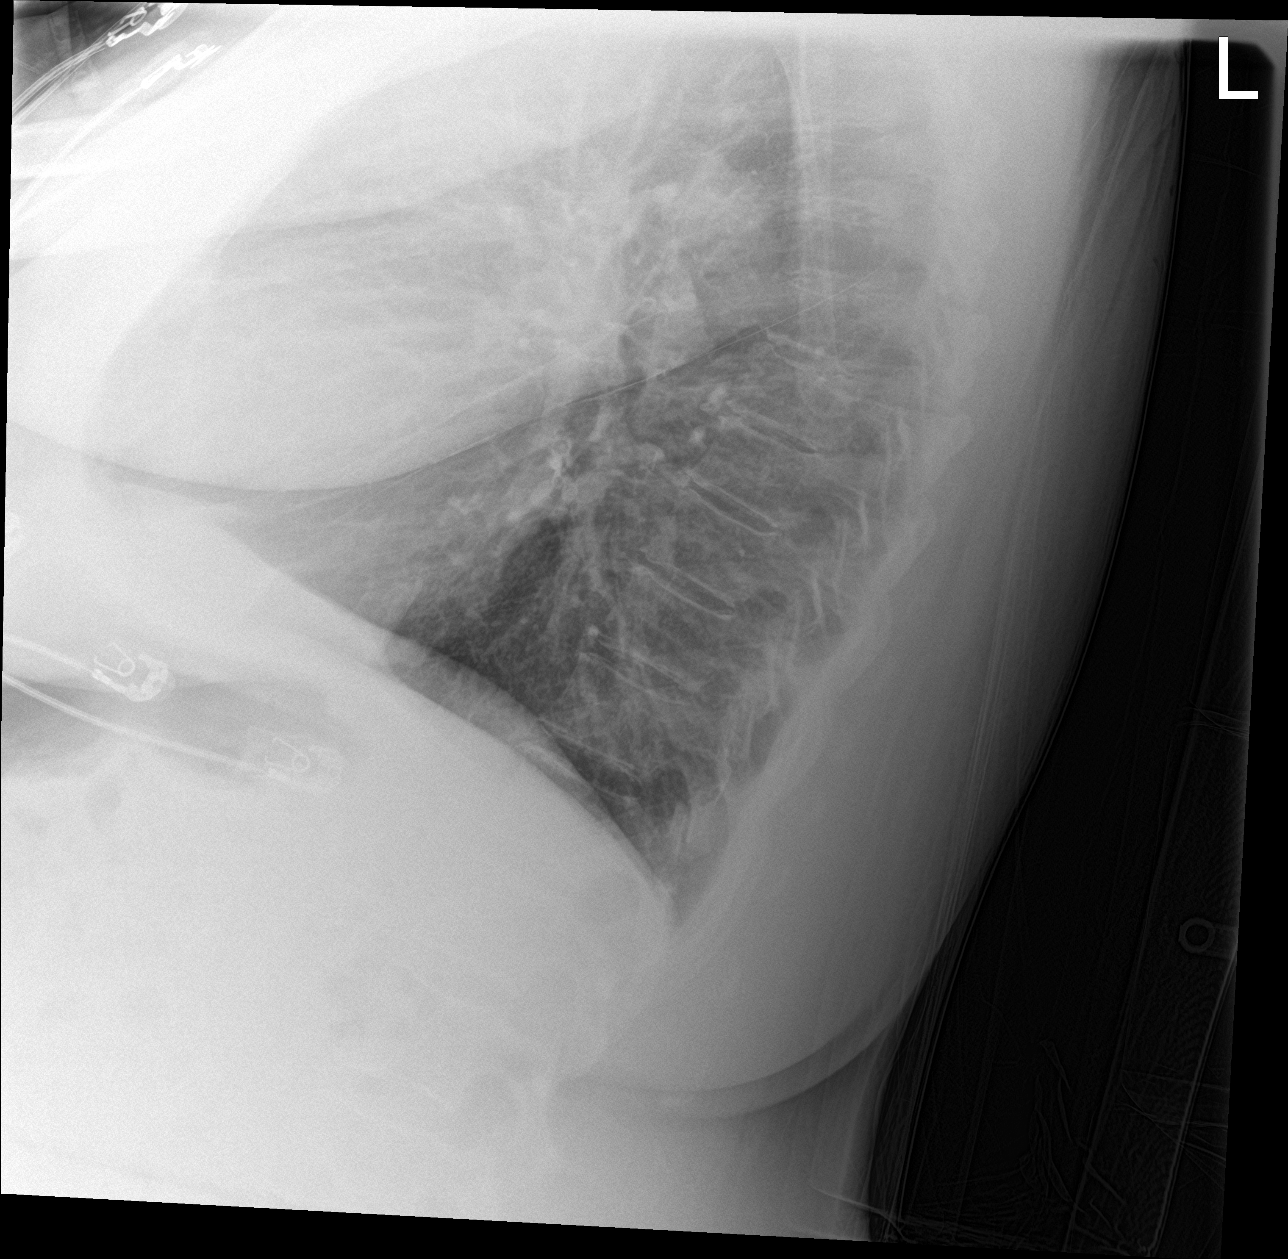

[3 of 3 positions shown; findings below may reference images not displayed]

FINDINGS: The heart size and mediastinal contours are within normal limits. No
focal consolidation. No pleural effusion. No pneumothorax. Chronic
irregularity of the distal right clavicle likely related to prior
trauma.
IMPRESSION: No active cardiopulmonary disease.

## 2022-05-26 ENCOUNTER — Telehealth: Payer: Self-pay | Admitting: Pulmonary Disease

## 2022-05-26 ENCOUNTER — Ambulatory Visit: Payer: Self-pay | Admitting: *Deleted

## 2022-05-26 ENCOUNTER — Telehealth: Payer: Self-pay | Admitting: Pharmacist

## 2022-05-26 NOTE — Telephone Encounter (Signed)
Left message for patient to call back  

## 2022-05-26 NOTE — Telephone Encounter (Signed)
Patient scheduled for [facility] new start on 06/13/22  Chesley Mires, PharmD, MPH, BCPS, CPP Clinical Pharmacist (Rheumatology and Pulmonology)

## 2022-05-26 NOTE — Telephone Encounter (Signed)
  Chief Complaint: Abdominal pain Symptoms: Abd pain above navel x  "Some months" worsening, comes and goes. Vomiting and diarrhea "When I eat meat and certain things." Not daily, not every episode.Unintentional weight loss.  Frequency: "Months, long time, getting worse." Pertinent Negatives: Patient denies fever Disposition: [] ED /[] Urgent Care (no appt availability in office) / [x] Appointment(In office/virtual)/ []  [place]/ [] Home Care/ [] Refused Recommended Disposition /[] [facility] [facility]/ []  Follow-up with PCP Additional Notes: Pt reluctant  to triage, "I don't want all these questionnaires, just want to be checked out, labs, was supposed to have colonoscopy but didn't do it." Secured first available for 06/16/22 and placed on wait list. Advised ED for worsening symptoms. Pt verbalizes understanding. WOuld like earlier appt. Reason for Disposition  Abdominal pain is a chronic symptom (recurrent or ongoing AND present > 4 weeks)  Answer Assessment - Initial Assessment Questions 1. LOCATION: "Where does it hurt?"      Mid abd, right above navel 2. RADIATION: "Does the pain shoot anywhere else?" (e.g., chest, back)     no 3. ONSET: "When did the pain begin?" (e.g., minutes, hours or days ago)      Months 4. SUDDEN: "Gradual or sudden onset?"     gradual 5. PATTERN "Does the pain come and go, or is it constant?"    - If it comes and goes: "How long does it last?" "Do you have pain now?"     (Note: Comes and goes means the pain is intermittent. It goes away completely between bouts.)    - If constant: "Is it getting better, staying the same, or getting worse?"      (Note: Constant means the pain never goes away completely; most serious pain is constant and gets worse.)      Comes and goes 6. SEVERITY: "How bad is the pain?"  (e.g., Scale 1-10; mild, moderate, or severe)    - MILD (1-3): Doesn't interfere with normal activities, abdomen soft and not tender to touch.      - MODERATE (4-7): Interferes with normal activities or awakens from sleep, abdomen tender to touch.     - SEVERE (8-10): Excruciating pain, doubled over, unable to do any normal activities.        7. RECURRENT SYMPTOM: "Have you ever had this type of stomach pain before?" If Yes, ask: "When was the last time?" and "What happened that time?"       8. CAUSE: "What do you think is causing the stomach pain?"      9. RELIEVING/AGGRAVATING FACTORS: "What makes it better or worse?" (e.g., antacids, bending or twisting motion, bowel movement)      10. OTHER SYMPTOMS: "Do you have any other symptoms?" (e.g., back pain, diarrhea, fever, urination pain, vomiting)       Vomiting and diarrhea with eating, happens off and on.  Protocols used: Abdominal Pain - Female-A-AH

## 2022-05-27 IMAGING — DX DG CHEST 1V PORT
1 series · 1 of 1 positions shown · non-contrast
Comparison: September 25, 2021.

CLINICAL DATA: Shortness of breath.

EXAM:
PORTABLE CHEST 1 VIEW

[chest ap]
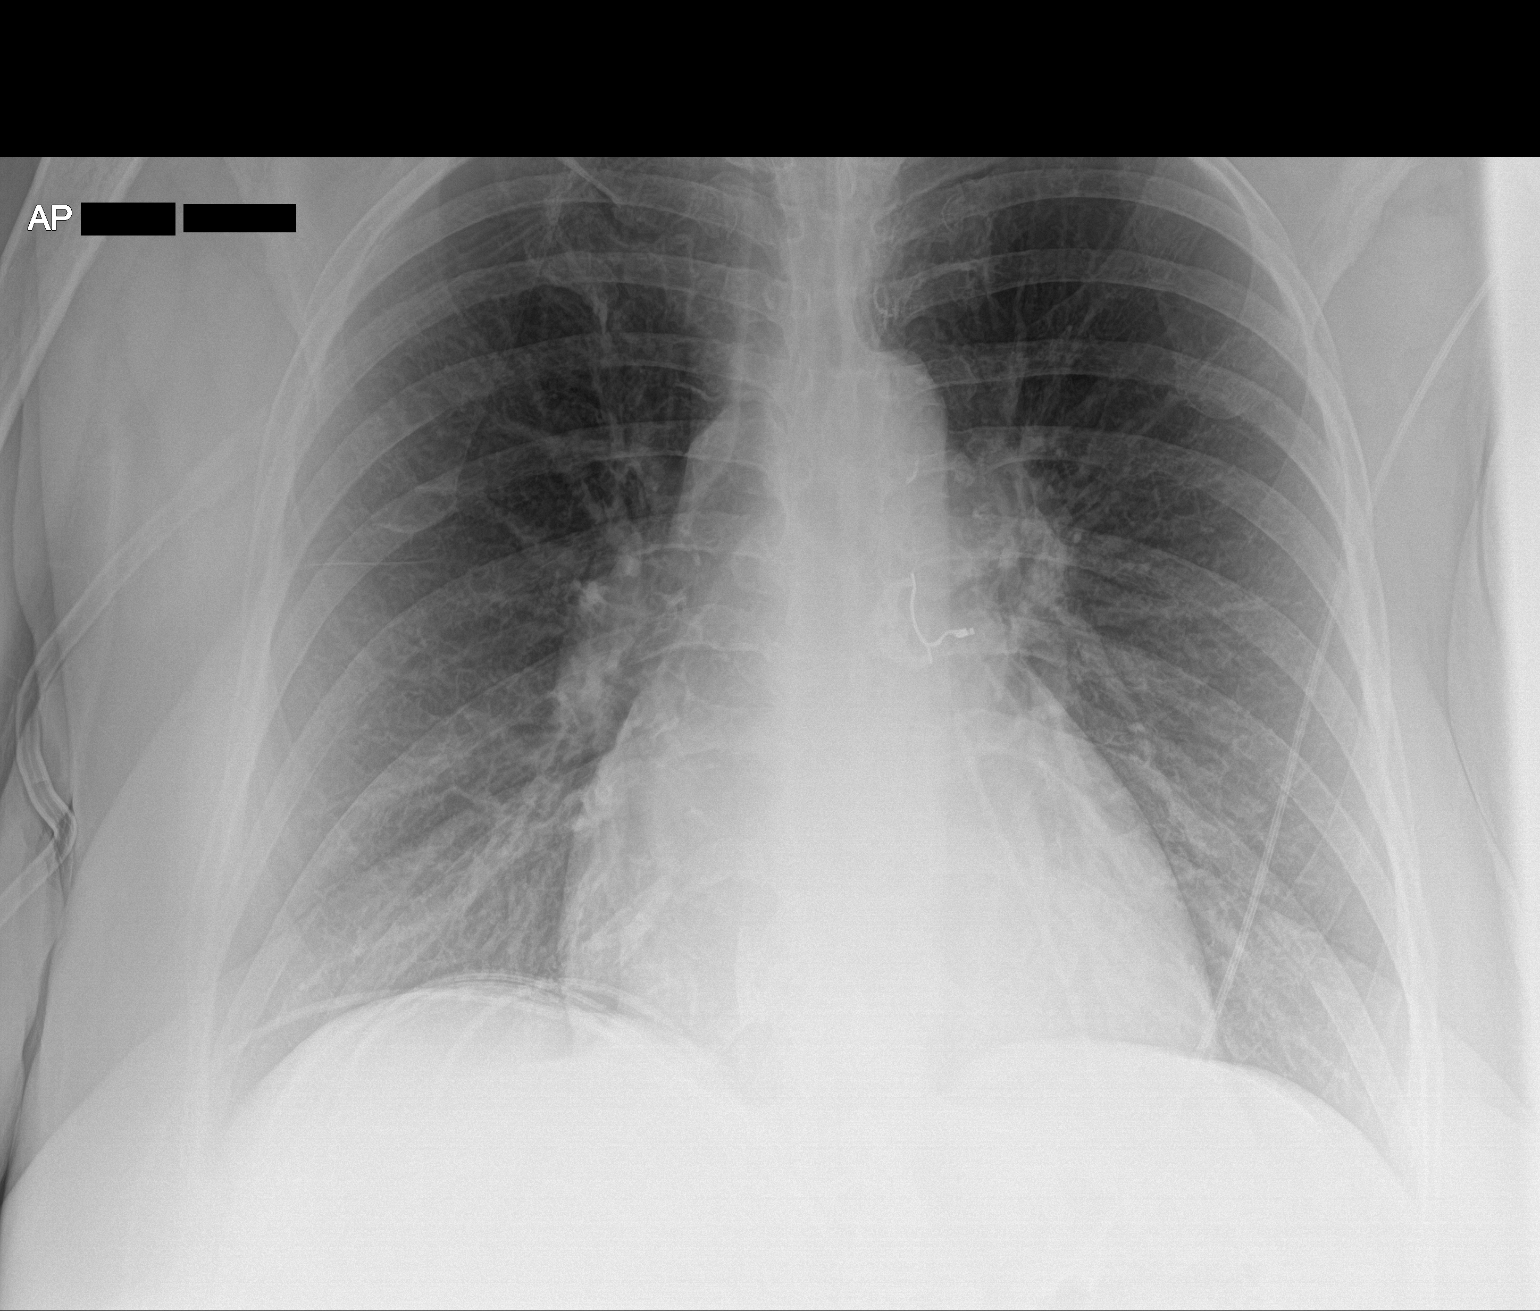

[1 of 1 positions shown; findings below may reference images not displayed]

FINDINGS: The heart size and mediastinal contours are within normal limits.
Both lungs are clear. The visualized skeletal structures are
unremarkable.
IMPRESSION: No active disease.

## 2022-05-28 IMAGING — DX DG CHEST 1V PORT
1 series · 2 of 2 positions shown · non-contrast
Comparison: 09/27/2021

CLINICAL DATA: Asthma.

EXAM:
PORTABLE CHEST 1 VIEW

[Series 1: chest ap · 0.14mm/px · 2 of 2 slices shown]
[im 1/2]
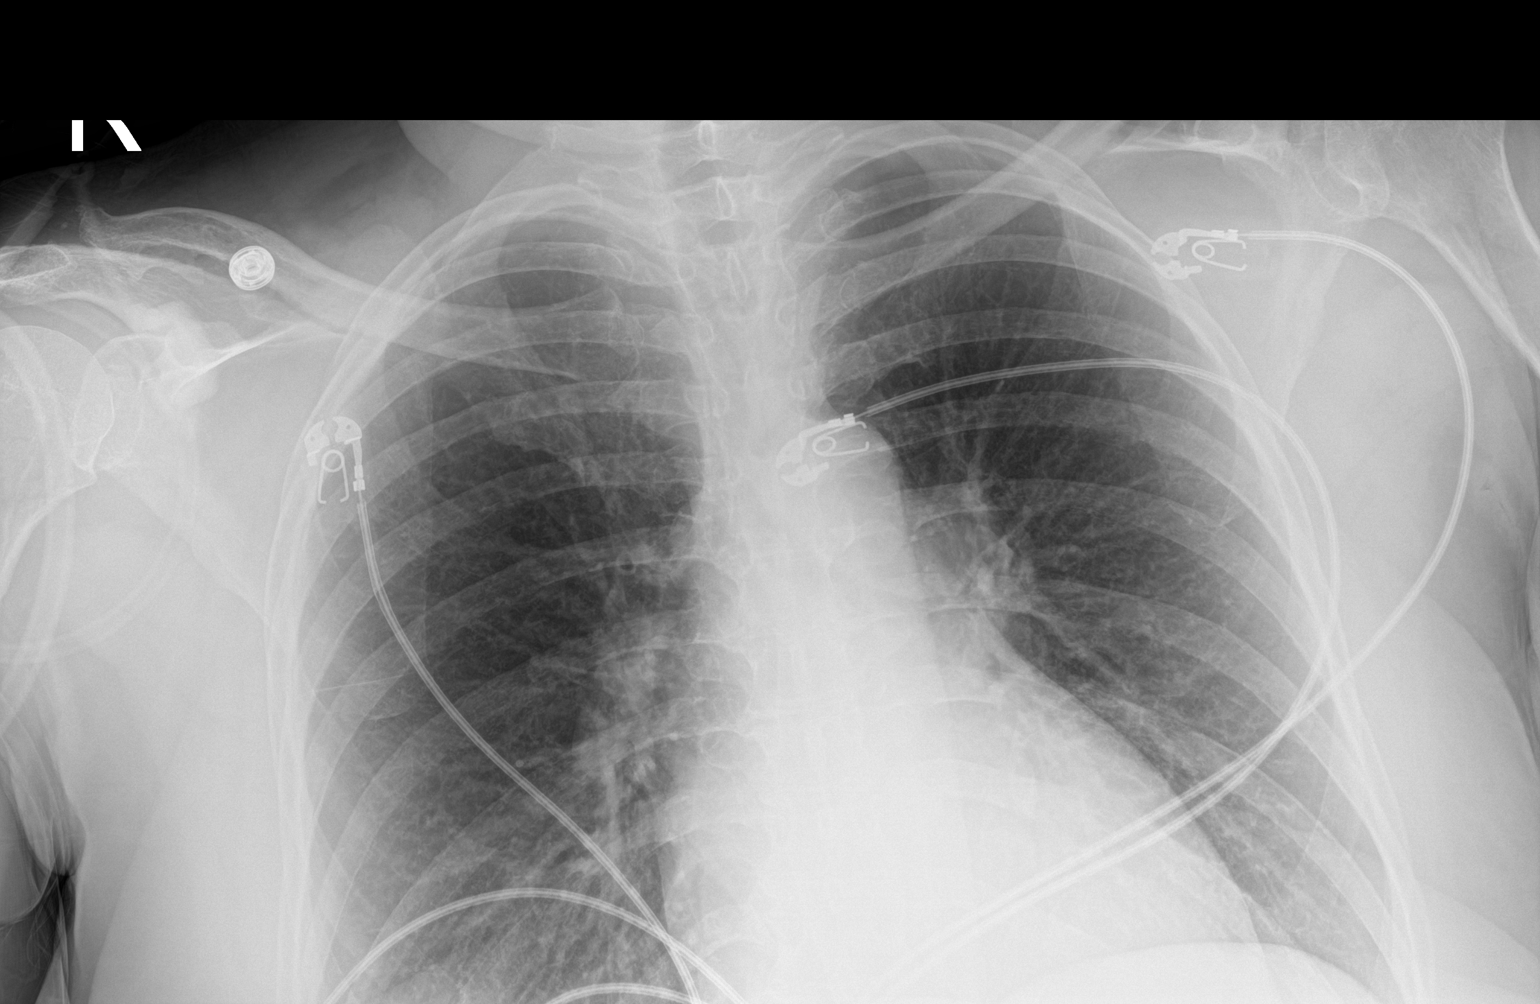
[im 2/2]
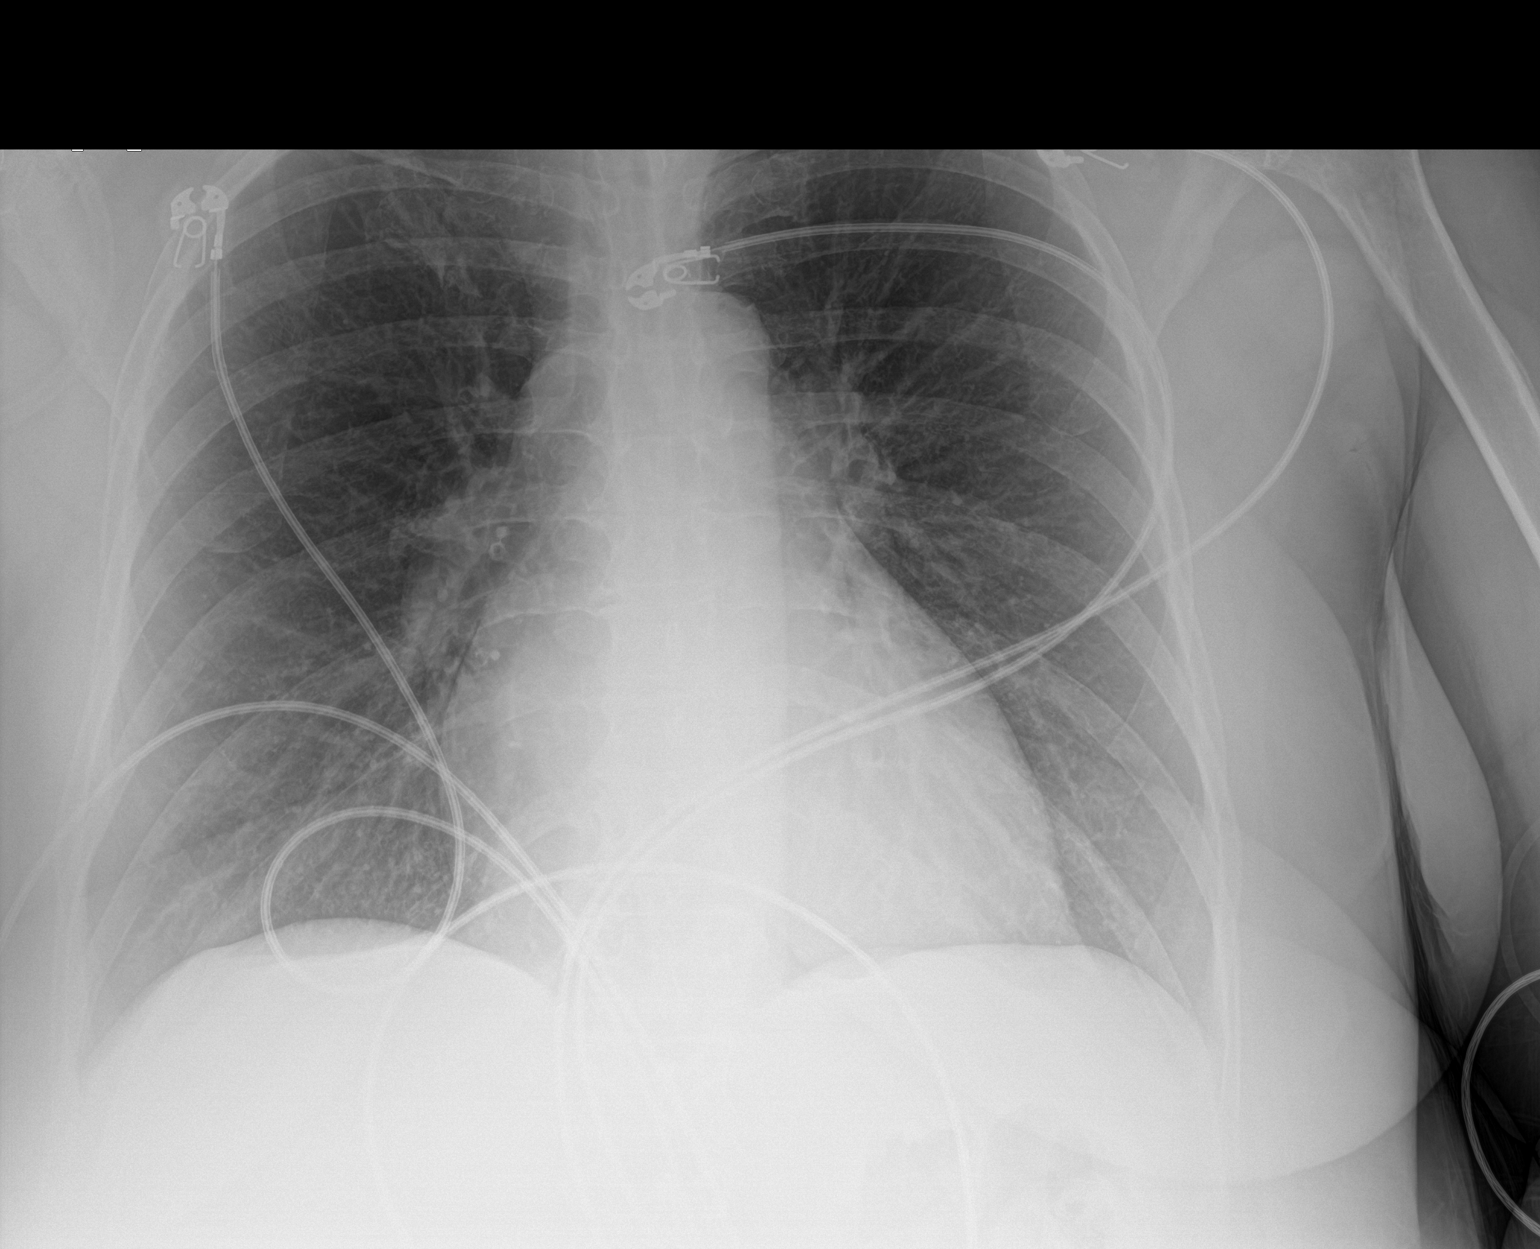

[2 of 2 positions shown; findings below may reference images not displayed]

FINDINGS: The cardiac silhouette, mediastinal and hilar contours are within
normal limits and stable. The lungs are clear. No pleural effusions.
No pulmonary lesions. The bony thorax is intact.
IMPRESSION: No acute cardiopulmonary findings.

## 2022-05-29 ENCOUNTER — Emergency Department (HOSPITAL_COMMUNITY): Payer: Medicaid Other

## 2022-05-29 ENCOUNTER — Encounter (HOSPITAL_COMMUNITY): Payer: Self-pay | Admitting: Emergency Medicine

## 2022-05-29 ENCOUNTER — Emergency Department (HOSPITAL_COMMUNITY)
Admission: EM | Admit: 2022-05-29 | Discharge: 2022-05-29 | Disposition: A | Discharge status: Home / Self Care | Payer: Medicaid Other | Attending: Emergency Medicine | Admitting: Emergency Medicine

## 2022-05-29 ENCOUNTER — Other Ambulatory Visit: Payer: Self-pay

## 2022-05-29 DIAGNOSIS — R0600 Dyspnea, unspecified: Secondary | ICD-10-CM | POA: Diagnosis not present

## 2022-05-29 DIAGNOSIS — Z91040 Latex allergy status: Secondary | ICD-10-CM | POA: Diagnosis not present

## 2022-05-29 DIAGNOSIS — R0602 Shortness of breath: Secondary | ICD-10-CM | POA: Insufficient documentation

## 2022-05-29 DIAGNOSIS — R0789 Other chest pain: Secondary | ICD-10-CM | POA: Diagnosis not present

## 2022-05-29 DIAGNOSIS — I10 Essential (primary) hypertension: Secondary | ICD-10-CM | POA: Diagnosis not present

## 2022-05-29 DIAGNOSIS — R000 Tachycardia, unspecified: Secondary | ICD-10-CM | POA: Diagnosis not present

## 2022-05-29 DIAGNOSIS — J449 Chronic obstructive pulmonary disease, unspecified: Secondary | ICD-10-CM | POA: Insufficient documentation

## 2022-05-29 DIAGNOSIS — Z7951 Long term (current) use of inhaled steroids: Secondary | ICD-10-CM | POA: Diagnosis not present

## 2022-05-29 DIAGNOSIS — I499 Cardiac arrhythmia, unspecified: Secondary | ICD-10-CM | POA: Diagnosis not present

## 2022-05-29 DIAGNOSIS — I509 Heart failure, unspecified: Secondary | ICD-10-CM | POA: Insufficient documentation

## 2022-05-29 DIAGNOSIS — Z7982 Long term (current) use of aspirin: Secondary | ICD-10-CM | POA: Insufficient documentation

## 2022-05-29 DIAGNOSIS — J45901 Unspecified asthma with (acute) exacerbation: Secondary | ICD-10-CM | POA: Diagnosis not present

## 2022-05-29 DIAGNOSIS — J45909 Unspecified asthma, uncomplicated: Secondary | ICD-10-CM | POA: Insufficient documentation

## 2022-05-29 DIAGNOSIS — R062 Wheezing: Secondary | ICD-10-CM | POA: Diagnosis not present

## 2022-05-29 DIAGNOSIS — R457 State of emotional shock and stress, unspecified: Secondary | ICD-10-CM | POA: Diagnosis not present

## 2022-05-29 LAB — URINALYSIS, ROUTINE W REFLEX MICROSCOPIC
Bacteria, UA: NONE SEEN
Bilirubin Urine: NEGATIVE
Glucose, UA: NEGATIVE mg/dL
Ketones, ur: NEGATIVE mg/dL
Leukocytes,Ua: NEGATIVE
Nitrite: NEGATIVE
Protein, ur: NEGATIVE mg/dL
Specific Gravity, Urine: 1.023 (ref 1.005–1.030)
pH: 5 (ref 5.0–8.0)

## 2022-05-29 LAB — COMPREHENSIVE METABOLIC PANEL
ALT: 13 U/L (ref 0–44)
AST: 16 U/L (ref 15–41)
Albumin: 3.4 g/dL — ABNORMAL LOW (ref 3.5–5.0)
Alkaline Phosphatase: 53 U/L (ref 38–126)
Anion gap: 6 (ref 5–15)
BUN: 19 mg/dL (ref 6–20)
CO2: 27 mmol/L (ref 22–32)
Calcium: 8.4 mg/dL — ABNORMAL LOW (ref 8.9–10.3)
Chloride: 110 mmol/L (ref 98–111)
Creatinine, Ser: 1.01 mg/dL — ABNORMAL HIGH (ref 0.44–1.00)
GFR, Estimated: >60 mL/min (ref >60)
Glucose, Bld: 116 mg/dL — ABNORMAL HIGH (ref 70–99)
Potassium: 4.4 mmol/L (ref 3.5–5.1)
Sodium: 143 mmol/L (ref 135–145)
Total Bilirubin: 0.3 mg/dL (ref 0.3–1.2)
Total Protein: 6.6 g/dL (ref 6.5–8.1)

## 2022-05-29 LAB — CBC
HCT: 43.4 % (ref 36.0–46.0)
Hemoglobin: 13.9 g/dL (ref 12.0–15.0)
MCH: 28.7 pg (ref 26.0–34.0)
MCHC: 32 g/dL (ref 30.0–36.0)
MCV: 89.7 fL (ref 80.0–100.0)
Platelets: 278 10*3/uL (ref 150–400)
RBC: 4.84 MIL/uL (ref 3.87–5.11)
RDW: 15.5 % (ref 11.5–15.5)
WBC: 6.1 10*3/uL (ref 4.0–10.5)
nRBC: 0 % (ref 0.0–0.2)

## 2022-05-29 LAB — TROPONIN I (HIGH SENSITIVITY): Troponin I (High Sensitivity): 5 ng/L (ref <18)

## 2022-05-29 MED ORDER — LIDOCAINE VISCOUS HCL 2 % MT SOLN
15.0000 mL | Freq: Once | OROMUCOSAL | Status: AC
  Administered 2022-05-29: 15 mL via ORAL
  Filled 2022-05-29: qty 15

## 2022-05-29 MED ORDER — LORAZEPAM 1 MG PO TABS
1.0000 mg | ORAL_TABLET | Freq: Once | ORAL | Status: AC
  Administered 2022-05-29: 1 mg via ORAL
  Filled 2022-05-29: qty 1

## 2022-05-29 MED ORDER — ALBUTEROL SULFATE HFA 108 (90 BASE) MCG/ACT IN AERS
2.0000 | INHALATION_SPRAY | RESPIRATORY_TRACT | Status: DC | PRN
  Administered 2022-05-29: 2 via RESPIRATORY_TRACT
  Filled 2022-05-29: qty 6.7

## 2022-05-29 MED ORDER — FENTANYL CITRATE PF 50 MCG/ML IJ SOSY
50.0000 ug | PREFILLED_SYRINGE | Freq: Once | INTRAMUSCULAR | Status: AC
  Administered 2022-05-29: 50 ug via INTRAVENOUS
  Filled 2022-05-29: qty 1

## 2022-05-29 MED ORDER — CLONIDINE HCL 0.1 MG PO TABS
0.2000 mg | ORAL_TABLET | Freq: Once | ORAL | Status: AC
  Administered 2022-05-29: 0.2 mg via ORAL
  Filled 2022-05-29: qty 2

## 2022-05-29 MED ORDER — PREDNISONE 10 MG (21) PO TBPK: ORAL_TABLET | Freq: Every day | ORAL | 0 refills | Status: DC

## 2022-05-29 MED ORDER — ALUM & MAG HYDROXIDE-SIMETH 200-200-20 MG/5ML PO SUSP
30.0000 mL | Freq: Once | ORAL | Status: AC
  Administered 2022-05-29: 30 mL via ORAL
  Filled 2022-05-29: qty 30

## 2022-05-29 NOTE — Discharge Instructions (Addendum)
Follow-up with your pulmonologist and your primary care doctor.  Call their office to request close appointment this week.  Please come back to the emergency room if you are having any increased work of breathing, chest pain, fever or other new concerning symptom.

## 2022-05-29 NOTE — ED Provider Notes (Signed)
[facility] DEPT Provider Note   CSN: [id number] Arrival date & time: 05/29/22  1851     History  Chief Complaint  Patient presents with   Shortness of Breath    Kathleen Rodriguez is a 56 y.o. female with past medical history significant for anxiety, acid reflux, asthma, COPD, CHF who presents with concern for shortness of breath at home over the last 2 days, worsened today.  Patient reports that she thinks her asthma was exacerbated by the environment, however she is also had some significant recent life stresses with reports that her son has been "AWOL" for the last several days, and one of his friends had been brought up to her apartment after being shot and so the SWAT team had room in her apartment.  Patient received 2 g magnesium, 5 mg albuterol, 125 mg Solu-Medrol, and albuterol as nebulized prior to arrival.  On my evaluation patient reports that she is having a lot of trouble with anxiety, and is still feeling some tightness in the chest but reports that her breathing has improved significantly.   Shortness of Breath      Home Medications Prior to Admission medications   Medication Sig Start Date End Date Taking? Authorizing Provider  albuterol (PROAIR HFA) 108 (90 Base) MCG/ACT inhaler TAKE 2 PUFFS BY MOUTH EVERY 6 HOURS AS NEEDED FOR WHEEZE OR SHORTNESS OF BREATH Patient not taking: Reported on 02/10/2022 04/02/21   Shawna Clamp, MD  albuterol (PROVENTIL) (2.5 MG/3ML) 0.083% nebulizer solution Take 3 mLs (2.5 mg total) by nebulization every 4 (four) hours as needed for wheezing or shortness of breath. 02/10/22   Cobb, Karie Schwalbe, NP  aspirin 81 MG EC tablet Take 1 tablet (81 mg total) by mouth daily. 02/07/19   [name], MD  Blood Glucose Monitoring Suppl (ACCU-CHEK GUIDE ME) w/Device KIT Use to check blood sugar once daily. 05/04/21   [name], MD  Budeson-Glycopyrrol-Formoterol (BREZTRI AEROSPHERE) 160-9-4.8 MCG/ACT AERO Inhale 2 puffs  into the lungs in the morning and at bedtime. 02/10/22   Cobb, Karie Schwalbe, NP  cloNIDine (CATAPRES) 0.2 MG tablet Take 1 tablet (0.2 mg total) by mouth 2 (two) times daily. For BP and hot flashes 01/18/22   Charlynne Cousins, MD  fluticasone [facility]) 50 MCG/ACT nasal spray Place 1 spray into both nostrils daily as needed for allergies. 02/10/22   Cobb, Karie Schwalbe, NP  gabapentin (NEURONTIN) 300 MG capsule TAKE 1 CAPSULE BY MOUTH TWICE A DAY 05/10/22   [name], MD  glucose blood (ACCU-CHEK GUIDE) test strip Use as instructed daily before breakfast. 10/05/21   [name], MD  ibuprofen (ADVIL) 800 MG tablet Take 800 mg by mouth every 8 (eight) hours as needed (for pain).    [provider]  loratadine (CLARITIN) 10 MG tablet Take 1 tablet (10 mg total) by mouth daily. 10/02/21   Mick Sell, PA-C  metFORMIN (GLUCOPHAGE) 500 MG tablet TAKE 1 TABLET BY MOUTH 2 TIMES DAILY WITH A MEAL. 02/09/22   [name], MD  montelukast (SINGULAIR) 10 MG tablet Take 1 tablet (10 mg total) by mouth at bedtime. 02/10/22   Cobb, Karie Schwalbe, NP  pantoprazole (PROTONIX) 40 MG tablet Take 1 tablet (40 mg total) by mouth daily. 02/10/22   Cobb, Karie Schwalbe, NP  predniSONE (DELTASONE) 10 MG tablet 4 tabs for 2 days, then 3 tabs for 2 days, 2 tabs for 2 days, then start daily 10 mg 02/10/22   Cobb, Karie Schwalbe,  NP  predniSONE (DELTASONE) 10 MG tablet Take 1 tablet (10 mg total) by mouth daily with breakfast. 02/10/22   Cobb, Karie Schwalbe, NP      Allergies    Tomato, Latex, and Wool alcohol [lanolin]    Review of Systems   Review of Systems  Respiratory:  Positive for shortness of breath.   All other systems reviewed and are negative.   Physical Exam Updated Vital Signs There were no vitals taken for this visit. Physical Exam Vitals and nursing note reviewed.  Constitutional:      General: She is not in acute distress.    Appearance: Normal appearance.  HENT:     Head: Normocephalic  and atraumatic.  Eyes:     General:        Right eye: No discharge.        Left eye: No discharge.  Cardiovascular:     Rate and Rhythm: Normal rate and regular rhythm.     Heart sounds: No murmur heard.    No friction rub. No gallop.     Comments: Intermittent tachycardia, no accessory heart sounds Pulmonary:     Effort: Pulmonary effort is normal.     Breath sounds: Normal breath sounds.     Comments: Wheezing on my evaluation, patient however is somewhat anxious, mild tachypnea noted.  He does not seem to be in respiratory distress at time my evaluation. Abdominal:     General: Bowel sounds are normal.     Palpations: Abdomen is soft.  Skin:    General: Skin is warm and dry.     Capillary Refill: Capillary refill takes less than 2 seconds.  Neurological:     Mental Status: She is alert and oriented to person, place, and time.  Psychiatric:        Mood and Affect: Mood normal.        Behavior: Behavior normal.     ED Results / Procedures / Treatments   Labs (all labs ordered are listed, but only abnormal results are displayed) Labs Reviewed  CBC  COMPREHENSIVE METABOLIC PANEL  URINALYSIS, ROUTINE W REFLEX MICROSCOPIC  TROPONIN I (HIGH SENSITIVITY)    EKG None  Radiology DG Chest 2 View  Result Date: 05/29/2022 CLINICAL DATA:  Dyspnea EXAM: CHEST - 2 VIEW COMPARISON:  None Available. FINDINGS: The heart size and mediastinal contours are within normal limits. Both lungs are clear. The visualized skeletal structures are unremarkable. IMPRESSION: No active cardiopulmonary disease. Electronically Signed   By: Fidela Salisbury M.D.   On: 05/29/2022 19:31    Procedures Procedures    Medications Ordered in ED Medications  albuterol (VENTOLIN HFA) 108 (90 Base) MCG/ACT inhaler 2 puff (2 puffs Inhalation Given 05/29/22 2021)  alum & mag hydroxide-simeth (MAALOX/MYLANTA) 200-200-20 MG/5ML suspension 30 mL (30 mLs Oral Given 05/29/22 2021)    And  lidocaine (XYLOCAINE) 2 %  viscous mouth solution 15 mL (15 mLs Oral Given 05/29/22 2021)  LORazepam (ATIVAN) tablet 1 mg (1 mg Oral Given 05/29/22 2021)    ED Course/ Medical Decision Making/ A&P                           Medical Decision Making Amount and/or Complexity of Data Reviewed Labs: ordered. Radiology: ordered.  Risk OTC drugs. Prescription drug management.   This is a 56 year old female with a past medical history significant for anxiety, acid reflux, asthma, COPD, CHF, who presents with concern for shortness of breath,  abdominal pain, and anxiety regarding her son leaving home.  Members differential diagnosis includes asthma attack, COPD exacerbation, pneumonia, other upper respiratory or lower respiratory infection, ARDS, COVID, flu, atypical ACS, also considered acid reflux, gastric ulcer, other infectious epigastric abdominal pain including mesenteric ischemia, or other acute or surgical abdomen.  This is not an exhaustive differential.  On initial physical exam patient is somewhat anxious, with intermittent tachycardia, tachypnea likely secondary to just receiving Solu-Medrol and albuterol just prior to arrival.  She has no wheezing at time my initial exam.  She has minimal tenderness palpation in the epigastric region.  She is in no acute respiratory distress.  Independently interpreted plain film radiograph of the chest which shows no acute intrathoracic abnormality.  I agree with radiologist rotation.  At time of handoff patient is still pending troponin, UA, CMP, CBC, EKG and response to treatment with GI cocktail, Ativan for stress, and repeat breathing treatment. She will need further evaluation to assess her full clinical picture and response to treatment as she has just begun her care and has not received lab work at this time.  8:37 PM Care of Kathleen Rodriguez transferred to Dr. Roslynn Amble at the end of my shift as the patient will require reassessment once labs/imaging have resulted. Patient  presentation, ED course, and plan of care discussed with review of all pertinent labs and imaging. Please see his/her note for further details regarding further ED course and disposition. Plan at time of handoff is pending workup and labwork. This may be altered or completely changed at the discretion of the oncoming team pending results of further workup.  Final Clinical Impression(s) / ED Diagnoses Final diagnoses:  None    Rx / DC Orders ED Discharge Orders     None         Dorien Chihuahua 05/29/22 2037    Lucrezia Starch, MD 05/30/22 [phone]

## 2022-05-29 NOTE — ED Triage Notes (Signed)
Patient BIBA from home c/o SOB. She reports 1 neb tx prior to EMS arrival.  2 g Magnesium 5 mg albuterol tx given  125 mg Solu-Medrol  RUQ exp wheezing noted.  SpO2 100% RA  BP 138/70 HR 70

## 2022-05-31 IMAGING — DX DG CHEST 1V PORT
1 series · 1 of 1 positions shown · non-contrast
Comparison: Chest XR, 09/28/2021 and 09/27/2021. CT chest,
03/12/2018 and 02/05/2013.

CLINICAL DATA: Shortness of breath.  History of asthma.

EXAM:
PORTABLE CHEST 1 VIEW

[chest ap]
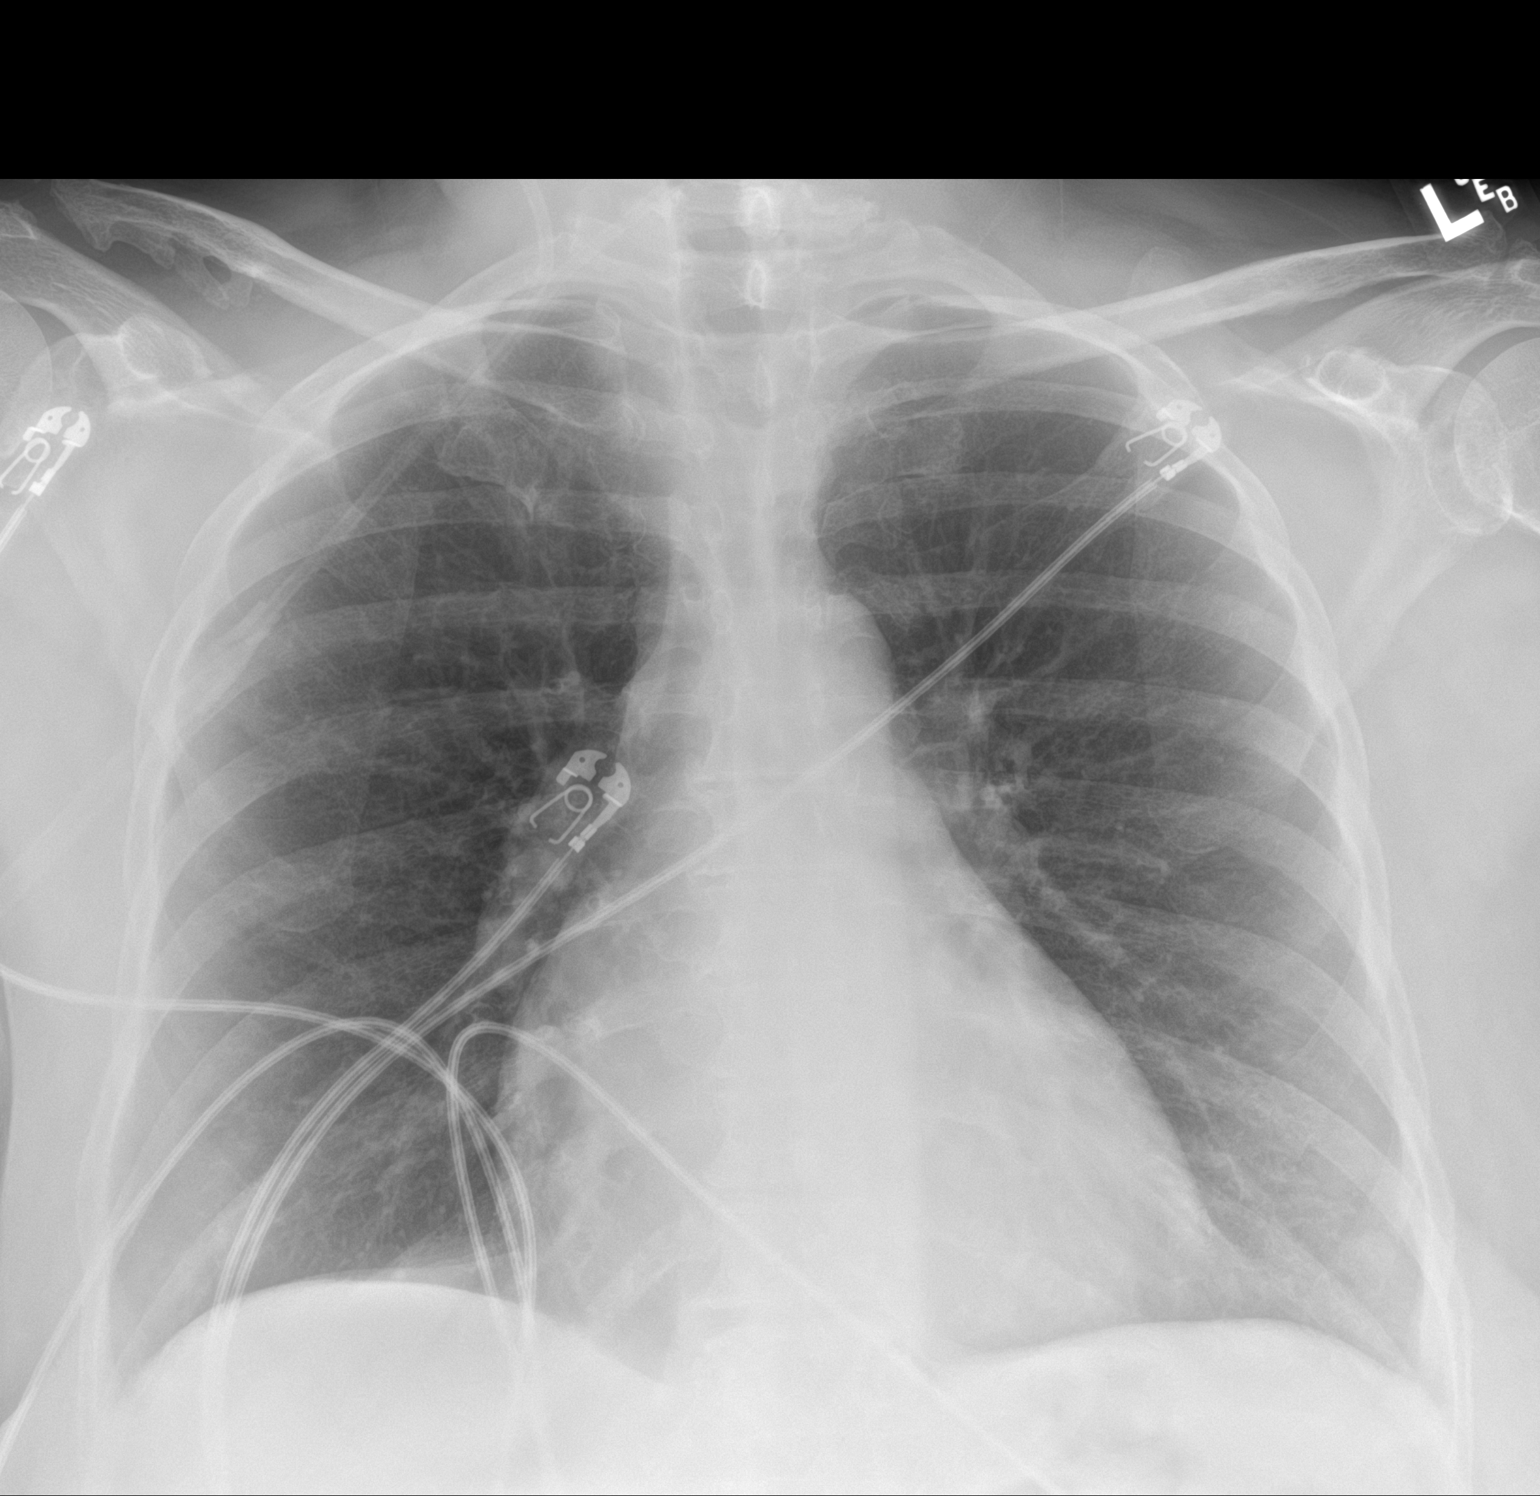

[1 of 1 positions shown; findings below may reference images not displayed]

FINDINGS: Cardiomediastinal silhouette is within normal limits. Lungs are well
inflated. LEFT nipple shadow. No focal consolidation or mass. No
pleural effusion or pneumothorax. Chronic RIGHT distal clavicular
deformity. No acute displaced fracture.
IMPRESSION: No acute cardiopulmonary process.

## 2022-06-01 ENCOUNTER — Ambulatory Visit: Payer: Self-pay | Admitting: *Deleted

## 2022-06-01 NOTE — Telephone Encounter (Signed)
  Chief Complaint: right side abdominal pain wants sooner appt  Symptoms: abdominal pain right side, nausea, burning, vomiting , diarrhea reports may have seen blood one time in stool.. Frequency: "a while " Pertinent Negatives: Patient denies fever, no vomiting blood.  Disposition: [] ED /[x] Urgent Care (no appt availability in office) / [] Appointment(In office/virtual)/ []  [facility]/ [] Home Care/ [] Refused Recommended Disposition /[] [place] [facility]/ []  Follow-up with PCP Additional Notes:   Attempted to schedule at [facility] . Transportation issues unable to schedule. Patient reports she will go to UC.      Reason for Disposition  [1] MODERATE pain (e.g., interferes with normal activities) AND [2] pain comes and goes (cramps) AND [3] present > 24 hours  (Exception: Pain with Vomiting or Diarrhea - see that Guideline.)  Answer Assessment - Initial Assessment Questions 1. LOCATION: "Where does it hurt?"      Right upper abdomen 2. RADIATION: "Does the pain shoot anywhere else?" (e.g., chest, back)     na 3. ONSET: "When did the pain begin?" (e.g., minutes, hours or days ago)       "A while ago " 4. SUDDEN: "Gradual or sudden onset?"     na 5. PATTERN "Does the pain come and go, or is it constant?"    - If it comes and goes: "How long does it last?" "Do you have pain now?"     (Note: Comes and goes means the pain is intermittent. It goes away completely between bouts.)    - If constant: "Is it getting better, staying the same, or getting worse?"      (Note: Constant means the pain never goes away completely; most serious pain is constant and gets worse.)      Comes and goes feels like burning  6. SEVERITY: "How bad is the pain?"  (e.g., Scale 1-10; mild, moderate, or severe)    - MILD (1-3): Doesn't interfere with normal activities, abdomen soft and not tender to touch.     - MODERATE (4-7): Interferes with normal activities or awakens from sleep, abdomen tender to touch.      - SEVERE (8-10): Excruciating pain, doubled over, unable to do any normal activities.       Getting worse 7. RECURRENT SYMPTOM: "Have you ever had this type of stomach pain before?" If Yes, ask: "When was the last time?" and "What happened that time?"      Yes  8. CAUSE: "What do you think is causing the stomach pain?"     "Ulcer" 9. RELIEVING/AGGRAVATING FACTORS: "What makes it better or worse?" (e.g., antacids, bending or twisting motion, bowel movement)     Antacids do not work  10. OTHER SYMPTOMS: "Do you have any other symptoms?" (e.g., back pain, diarrhea, fever, urination pain, vomiting)       Right side abdominal pain, nausea, vomiting atleast 30 after eating, diarrhea may have seen blood in stool 11. PREGNANCY: "Is there any chance you are pregnant?" "When was your last menstrual period?"       na  Protocols used: Abdominal Pain - [facility]

## 2022-06-13 ENCOUNTER — Other Ambulatory Visit: Payer: Medicaid Other | Admitting: Pharmacist

## 2022-06-13 NOTE — Telephone Encounter (Signed)
Patient no show to [facility] appointment today. Patient has no-showed multiple times. Will not r/s unless patient reaches out  Chesley Mires, PharmD, MPH, BCPS, CPP Clinical Pharmacist (Rheumatology and Pulmonology)

## 2022-06-13 NOTE — Progress Notes (Deleted)
HPI Patient presents today to [facility] to see pharmacy team for [facility] new start.  Past medical history includes ***  Most recent ED visit on 05/29/22 - was discharged with prednisone taper.  Number of hospitalizations in past year: Number of ***COPD/asthma exacerbations in past year:   Respiratory Medications Current regimen: Breztri 160-9-4.34mg (2 puffs twice daily), montelukast 154mnightly, loratadine 1049maily,  Tried in past: *** Patient reports {Adherence challenges yes no:[id number]::"adherence challenges","no known adherence challenges"}  OBJECTIVE Allergies  Allergen Reactions   Tomato Hives, Itching and Other (See Comments)    ALSO REACTS TO KETCHUP   Latex Itching and Rash   Wool Alcohol [Lanolin] Itching    Outpatient Encounter Medications as of 06/13/2022  Medication Sig Note   albuterol (PROAIR HFA) 108 (90 Base) MCG/ACT inhaler TAKE 2 PUFFS BY MOUTH EVERY 6 HOURS AS NEEDED FOR WHEEZE OR SHORTNESS OF BREATH    albuterol (PROVENTIL) (2.5 MG/3ML) 0.083% nebulizer solution Take 3 mLs (2.5 mg total) by nebulization every 4 (four) hours as needed for wheezing or shortness of breath.    aspirin 81 MG EC tablet Take 1 tablet (81 mg total) by mouth daily.    Blood Glucose Monitoring Suppl (ACCU-CHEK GUIDE ME) w/Device KIT Use to check blood sugar once daily.    Budeson-Glycopyrrol-Formoterol (BREZTRI AEROSPHERE) 160-9-4.8 MCG/ACT AERO Inhale 2 puffs into the lungs in the morning and at bedtime.    cloNIDine (CATAPRES) 0.2 MG tablet Take 1 tablet (0.2 mg total) by mouth 2 (two) times daily. For BP and hot flashes    fluticasone (FLONASE) 50 MCG/ACT nasal spray Place 1 spray into both nostrils daily as needed for allergies.    gabapentin (NEURONTIN) 300 MG capsule TAKE 1 CAPSULE BY MOUTH TWICE A DAY (Patient not taking: Reported on 05/29/2022)    glucose blood (ACCU-CHEK GUIDE) test strip Use as instructed daily before breakfast.    ibuprofen (ADVIL) 800 MG tablet Take  800 mg by mouth every 8 (eight) hours as needed (for pain).    loratadine (CLARITIN) 10 MG tablet Take 1 tablet (10 mg total) by mouth daily.    metFORMIN (GLUCOPHAGE) 500 MG tablet TAKE 1 TABLET BY MOUTH 2 TIMES DAILY WITH A MEAL.    montelukast (SINGULAIR) 10 MG tablet Take 1 tablet (10 mg total) by mouth at bedtime. (Patient not taking: Reported on 05/29/2022)    pantoprazole (PROTONIX) 40 MG tablet Take 1 tablet (40 mg total) by mouth daily. (Patient not taking: Reported on 05/29/2022) 05/29/2022: Patient request a refill   predniSONE (DELTASONE) 10 MG tablet Take 1 tablet (10 mg total) by mouth daily with breakfast. (Patient not taking: Reported on 05/29/2022)    predniSONE (STERAPRED UNI-PAK 21 TAB) 10 MG (21) TBPK tablet Take by mouth daily. Take 6 tabs by mouth daily  for 1 day, then 5 tabs for 1 day, then 4 tabs for 1 day, then 3 tabs for 1 day, 2 tabs for 1 day, then 1 tab by mouth daily for 1 day    No facility-administered encounter medications on file as of 06/13/2022.     Immunization History  Administered Date(s) Administered   Influenza,inj,Quad PF,6+ Mos 08/13/2013, 07/27/2015, 07/21/2016, 10/22/2017, 08/01/2018, 06/17/2019, 10/29/2020   Moderna Sars-Covid-2 Vaccination 12/17/2019, 01/21/2021   PNEUMOCOCCAL CONJUGATE-20 03/02/2022   Pneumococcal Polysaccharide-23 06/17/2019   Tdap 08/14/2013   Zoster Recombinat (Shingrix) 12/02/2020, 03/18/2021     PFTs    Latest Ref Rng & Units 03/03/2017    9:18 AM  PFT  Results  FVC-Pre L 2.36   FVC-Predicted Pre % 77   FVC-Post L 2.45   FVC-Predicted Post % 80   Pre FEV1/FVC % % 58   Post FEV1/FCV % % 64   FEV1-Pre L 1.38   FEV1-Predicted Pre % 56   FEV1-Post L 1.57   DLCO uncorrected ml/min/mmHg 18.00   DLCO UNC% % 68   DLCO corrected ml/min/mmHg 17.74   DLCO COR %Predicted % 67   DLVA Predicted % 98   TLC L 4.47   TLC % Predicted % 84   RV % Predicted % 108      Eosinophils Most recent blood eosinophil count was ***  cells/microL taken on ***.   IgE: *** on ***   Assessment   Biologics training for benralizumab [name])  Goals of therapy: Mechanism of action: humanized afucosylated, monoclonal antibody (IgG1, kappa) that binds to the alpha subunit of the interleukin-5 receptor. IL-5 is the major cytokine responsible for the growth and differentiation, recruitment, activation, and survival of eosinophils (a cell type associated with inflammation and an important component in the pathogenesis of asthma) Reviewed that [name] is add-on medication and patient must continue maintenance inhaler regimen. Response to therapy: may take 4 months to determine efficacy. Discussed that patients generally feel improvement sooner than 4 months.  Side effects: antibody development (13%), headache (8%), pharyngitis (5%), injection site reaction (2.2%)  Dose: 30 mg subcutaneously at Week 0, Week 4, Week 8, then 74m every 8 weeks thereafter  Administration/Storage:   Reviewed administration sites of thigh or abdomen (at least 2-3 inches away from abdomen). Reviewed the upper arm is only appropriate if caregiver is administering injection  Do not shake the pen/syringe as this could lead to product foaming or precipitation. \ Reviewed storage of medication in refrigerator. Reviewed that FBerna Buecan be stored at room temperature in unopened carton for up to 14 days.  Side effects: headache (19%), injection site reaction (7-15%), antibody development (6%), backache (5%), fatigue (5%)  Access: Approval of Fasenra through: {specialtycoverage:25706} Patient enrolled into copay card program to help with copay assistance.   Patient self-administered Fasenra 322mmL in *** using sample Fasenra 3072mL autoinjector pen NDC: *** Lot: *** Expiration: ***  Patient monitored for 30 minutes for adverse reaction.  Patient tolerated ***. Injection site checked and no ***.  Medication Reconciliation  A drug regimen  assessment was performed, including review of allergies, interactions, disease-state management, dosing and immunization history. Medications were reviewed with the patient, including name, instructions, indication, goals of therapy, potential side effects, importance of adherence, and safe use.  Drug interaction(s): ***  Immunizations  Patient is indicated for the influenzae, pneumonia, and shingles vaccinations. Patient has received *** COVID19 vaccines.   PLAN Continue Fasenra 52m76m Week 4 on *** and Week 8 on ***. Then continue Fasenra 52mg18mry 8 weeks thereafter starting on ***. Rx sent to: {SpecialtyPharmacies:25705}. Patient provided with pharmacy phone number and advised to call later this week to schedule shipment to home. Patient provided with copay card information to provide to pharmacy if quoted copay exceeds $5 per month. Continue maintenance asthma regimen of: ***  All questions encouraged and answered.  Instructed patient to reach out with any further questions or concerns.  Thank you for allowing pharmacy to participate in this patient's care.  This appointment required *** minutes of patient care (this includes precharting, chart review, review of results, face-to-face care, etc.).   DevkiKnox SalivarmD, MPH, BCPS, CPP Clinical Pharmacist (Rheumatology and Pulmonology)

## 2022-06-15 NOTE — Progress Notes (Deleted)
Patient ID: VIANA SLEEP, female   DOB: [birth year], 56 y.o.   MRN: [id number]   ED visit 8/6  Kathleen Rodriguez is a 56 y.o. female with past medical history significant for anxiety, acid reflux, asthma, COPD, CHF who presents with concern for shortness of breath at home over the last 2 days, worsened today.  Patient reports that she thinks her asthma was exacerbated by the environment, however she is also had some significant recent life stresses with reports that her son has been "AWOL" for the last several days, and one of his friends had been brought up to her apartment after being shot and so the SWAT team had room in her apartment.  Patient received 2 g magnesium, 5 mg albuterol, 125 mg Solu-Medrol, and albuterol as nebulized prior to arrival.  On my evaluation patient reports that she is having a lot of trouble with anxiety, and is still feeling some tightness in the chest but reports that her breathing has improved significantly.  Risk OTC drugs. Prescription drug management.     This is a 56 year old female with a past medical history significant for anxiety, acid reflux, asthma, COPD, CHF, who presents with concern for shortness of breath, abdominal pain, and anxiety regarding her son leaving home.  Members differential diagnosis includes asthma attack, COPD exacerbation, pneumonia, other upper respiratory or lower respiratory infection, ARDS, COVID, flu, atypical ACS, also considered acid reflux, gastric ulcer, other infectious epigastric abdominal pain including mesenteric ischemia, or other acute or surgical abdomen.  This is not an exhaustive differential.   On initial physical exam patient is somewhat anxious, with intermittent tachycardia, tachypnea likely secondary to just receiving Solu-Medrol and albuterol just prior to arrival.  She has no wheezing at time my initial exam.  She has minimal tenderness palpation in the epigastric region.  She is in no acute respiratory  distress.  Independently interpreted plain film radiograph of the chest which shows no acute intrathoracic abnormality.  I agree with radiologist rotation.  At time of handoff patient is still pending troponin, UA, CMP, CBC, EKG and response to treatment with GI cocktail, Ativan for stress, and repeat breathing treatment. She will need further evaluation to assess her full clinical picture and response to treatment as she has just begun her care and has not received lab work at this time.   8:37 PM Care of Kathleen Rodriguez transferred to Dr. Stevie Kern at the end of my shift as the patient will require reassessment once labs/imaging have resulted. Patient presentation, ED course, and plan of care discussed with review of all pertinent labs and imaging. Please see his/her note for further details regarding further ED course and disposition. Plan at time of handoff is pending workup and labwork

## 2022-06-16 ENCOUNTER — Ambulatory Visit: Payer: Medicaid Other | Admitting: Physician Assistant

## 2022-06-28 ENCOUNTER — Other Ambulatory Visit: Payer: Self-pay | Admitting: Obstetrics and Gynecology

## 2022-06-28 NOTE — Patient Outreach (Signed)
Medicaid Managed Care   Nurse Care Manager Note  06/28/2022 Name:  Kathleen Rodriguez MRN:  [id number] DOB:  [birth year]  Kathleen Rodriguez is an 56 y.o. year old female who is a primary patient of [name], MD.  The [facility] Managed Care Coordination team was consulted for assistance with:    Chronic healthcare management needs, COPD, osteoarthritis, HTN, DM, OSA, asthma, anxiety, GERD  Ms. Aune was given information about Medicaid Managed Care Coordination team services today. Kathleen Rodriguez Patient agreed to services and verbal consent obtained.  Engaged with patient by telephone for follow up visit in response to provider referral for case management and/or care coordination services.   Assessments/Interventions:  Review of past medical history, allergies, medications, health status, including review of consultants reports, laboratory and other test data, was performed as part of comprehensive evaluation and provision of chronic care management services.  SDOH (Social Determinants of Health) assessments and interventions performed: SDOH Interventions    Flowsheet Row Patient Outreach Telephone from 06/28/2022 in [place] Patient Outreach Telephone from 03/14/2022 in [place] Patient Outreach Telephone from 02/07/2022 in [place] Patient Outreach Telephone from 01/07/2022 in [place] Patient Outreach Telephone from 11/09/2021 in [place] Patient Outreach Telephone from 10/14/2021 in [place] Coordination  SDOH Interventions        Food Insecurity Interventions -- -- Intervention Not Indicated -- -- --  Housing Interventions Other (Comment)  [patient states she is moving to a hotel-siad it wasn't because of rent-did not elaborate] -- -- -- Other  (Comment)  [patient has apartment now] --  Transportation Interventions -- -- -- Other (Comment)  [patient now utilizing [place] -- --  Utilities Interventions Intervention Not Indicated -- -- -- -- --  Financial Strain Interventions -- -- -- -- Other (Comment)  [referrals made-patient has resources available to her] --  Physical Activity Interventions -- --  [patient unable to engage in moderate to strenuous exercise] -- -- -- Other (Comments)  [patient recently hospitalized]  Stress Interventions -- -- -- Other (Comment)  [sees therapist and is on medication] -- --       Care Plan  Allergies  Allergen Reactions   Tomato Hives, Itching and Other (See Comments)    ALSO REACTS TO KETCHUP   Latex Itching and Rash   Wool Alcohol [Lanolin] Itching    Medications Reviewed Today     Reviewed by [name], RN (Registered Nurse) on 06/28/22 at [phone]  Med List Status: <None>   Medication Order Taking? Sig Documenting Provider Last Dose Status Informant  albuterol (PROAIR HFA) 108 (90 Base) MCG/ACT inhaler [id number]  TAKE 2 PUFFS BY MOUTH EVERY 6 HOURS AS NEEDED FOR WHEEZE OR SHORTNESS OF Cipriano Mile, MD  Active Self           Med Note (WHITE, Zandra Abts May 29, 2022 10:50 PM)    albuterol (PROVENTIL) (2.5 MG/3ML) 0.083% nebulizer solution [id number]  Take 3 mLs (2.5 mg total) by nebulization every 4 (four) hours as needed for wheezing or shortness of breath. Clayton Bibles, NP  Active Self  aspirin 81 MG EC tablet [id number]  Take 1 tablet (81 mg total) by mouth daily. [name], MD  Active Self           Med Note ([place] Gotwalt, Lorel Monaco  Fri Jul 30, 2021 12:47 PM)    Blood Glucose Monitoring Suppl (ACCU-CHEK GUIDE ME) w/Device KIT [id number]  Use to check blood sugar once daily. [name], MD  Active Self  Budeson-Glycopyrrol-Formoterol (BREZTRI AEROSPHERE) 160-9-4.8 MCG/ACT AERO [id number]  Inhale 2 puffs into the lungs in the morning and at bedtime.  Clayton Bibles, NP  Active Self  cloNIDine (CATAPRES) 0.2 MG tablet [id number]  Take 1 tablet (0.2 mg total) by mouth 2 (two) times daily. For BP and hot flashes Aileen Fass, Tammi Klippel, MD  Active Self  fluticasone [facility]) 50 MCG/ACT nasal spray [id number]  Place 1 spray into both nostrils daily as needed for allergies. Cobb, Karie Schwalbe, NP  Active Self  gabapentin (NEURONTIN) 300 MG capsule [id number] Yes TAKE 1 CAPSULE BY MOUTH TWICE A DAY Newlin, [name], MD Taking Active Self  glucose blood (ACCU-CHEK GUIDE) test strip [id number]  Use as instructed daily before breakfast. [name], MD  Active Self  ibuprofen (ADVIL) 800 MG tablet [id number]  Take 800 mg by mouth every 8 (eight) hours as needed (for pain). [provider]  Active Self  loratadine (CLARITIN) 10 MG tablet [id number]  Take 1 tablet (10 mg total) by mouth daily. Mick Sell, PA-C  Active Self  metFORMIN (GLUCOPHAGE) 500 MG tablet [id number]  TAKE 1 TABLET BY MOUTH 2 TIMES DAILY WITH A MEAL. [name], MD  Active Self  montelukast (SINGULAIR) 10 MG tablet [id number] Yes Take 1 tablet (10 mg total) by mouth at bedtime. Clayton Bibles, NP Taking Active Self  pantoprazole (PROTONIX) 40 MG tablet [id number] Yes Take 1 tablet (40 mg total) by mouth daily. Clayton Bibles, NP Taking Active Self           Med Note (WHITE, Zandra Abts May 29, 2022 10:52 PM) Patient request a refill  predniSONE (DELTASONE) 10 MG tablet [id number]  Take 1 tablet (10 mg total) by mouth daily with breakfast.  Patient not taking: Reported on 05/29/2022   Clayton Bibles, NP  Active Self  predniSONE (STERAPRED UNI-PAK 21 TAB) 10 MG (21) TBPK tablet [id number]  Take by mouth daily. Take 6 tabs by mouth daily  for 1 day, then 5 tabs for 1 day, then 4 tabs for 1 day, then 3 tabs for 1 day, 2 tabs for 1 day, then 1 tab by mouth daily for 1 day Lucrezia Starch, MD  Active             Patient Active Problem List   Diagnosis Date Noted    Seasonal allergic rhinitis 02/10/2022   Acute respiratory failure with hypoxia ([place]) 01/16/2022   Anxiety 10/17/2021   Asthma with COPD ([place]) 10/16/2021   Hypocalcemia 10/16/2021   Left shoulder pain 10/16/2021   Severe persistent asthma with acute exacerbation 09/25/2021   Diabetes mellitus without complication ([place]) 22/48/2500   OSA (obstructive sleep apnea) 01/20/2021   Essential hypertension 01/20/2021   Obesity (BMI 30-39.9) 01/20/2021   Acute asthma exacerbation 10/09/2019   COPD mixed type ([place]) 07/04/2019   Asthma 10/22/2017   Osteoarthritis of left knee 09/01/2016   Total knee replacement status 09/01/2016   Osteoarthritis of knees, 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   Tobacco abuse    Primary osteoarthritis of left knee 03/09/2016   Anemia, iron deficiency 08/12/2015   Dysfunctional uterine bleeding 12/28/2014   Morbid obesity ([place]) 05/16/2014   Upper airway cough syndrome 05/01/2014   GERD (gastroesophageal reflux disease) 04/08/2014   OSA (obstructive sleep apnea) 03/20/2013  Conditions to be addressed/monitored per PCP order:  Chronic healthcare management needs, COPD, osteoarthritis, HTN, DM, OSA, asthma, anxiety, GERD  Care Plan : General Plan of Care (Adult)  Updates made by [name], RN since 06/28/2022 12:00 AM     Problem: Health Promotion or Disease Self-Management (General Plan of Care) Resolved 06/28/2022  Priority: High  Onset Date: 02/24/2021     Problem: Health Promotion or Disease Self-Management (General Plan of Care)   Priority: High  Onset Date: 02/24/2021     Long-Range Goal: Self-Management Plan Developed   Start Date: 02/24/2021  Expected End Date: 09/27/2022  Recent Progress: Not on track  Priority: High  Note:   Current Barriers:  Ineffective Self Health Maintenance Knowledge Deficits related to short term plan for care coordination needs and long term plans for chronic disease management needs 06/28/22:  Patient in ED 05/29/22 for SOB, no complaints  today.  Patient states she is moving to a hotel this weekend not because she cannot pay her rent per patient-does not elaborate.  Declines any resources at this time as she states she has spoken with legal aid.Patient to schedule f/u appts with providers.  Nurse Case Manager Clinical Goal(s):  patient will work with care management team to address care coordination and chronic disease management needs related to Disease Management Educational Needs Care Coordination Medication Management and Education Medication Reconciliation Medication Assistance  Psychosocial Support   Interventions:  Evaluation of current treatment plan and patient's adherence to plan as established by provider. Reviewed medications with patient. Collaborated with pharmacy regarding medications. Discussed plans with patient for ongoing care management follow up and provided patient with direct contact information for care management team Advised patient, providing education and rationale, to monitor blood pressure daily and record, calling provider for findings outside established parameters.  Reviewed scheduled/upcoming provider appointments. Advised patient, providing education and rationale, to check cbg, BP and record, calling provider for findings outside established parameters.   Care guide referral for financial resources for rent, medicines-07/02/21-completed-patient provided resources. Care Guide referral for transportation. Update 04/12/21:  RNCM provided transportation resources to patient. Pharmacy referral for medication review. Update 04/12/21:  Patient has met with Pharmacist and continues to follow. Self Care Activities:  Patient will self administer medications as prescribed Patient will attend all scheduled provider appointments Patient will call pharmacy for medication refills Patient will continue to perform ADL's independently Patient will call provider office for new concerns or questions Patient to  follow up with PCP regarding diabetic shoes. Patient Goals: In the next 30 days, patient will check blood pressure and blood sugars as directed by provider. In the next 30 days, patient will attend all scheduled appointments. In the next 30 days, patient will meet with Pharmacist to discuss medications.- In the next 30 days, patient will schedule follow up appointments. Follow Up Plan: The patient has been provided with contact information for the care management team and has been advised to call with any health related questions or concerns.  The care management team will reach out to the patient again over the next 30  business days.     Follow Up:  Patient agrees to Care Plan and Follow-up.  Plan: The Managed Medicaid care management team will reach out to the patient again over the next 30 business  days. and The  Patient has been provided with contact information for the Managed Medicaid care management team and has been advised to call with any health related questions or concerns.  Date/time of next scheduled RN care management/care coordination outreach: 07/28/22 at 315.

## 2022-06-28 NOTE — Patient Instructions (Signed)
Hey Kathleen Rodriguez, I'm glad I was able to speak with you today, I hope you have a good day and your move goes well.  Kathleen Rodriguez was given information about Medicaid Managed Care team care coordination services as a part of their [place] Medicaid benefit. Kathleen Rodriguez verbally consented to engagement with the [facility] Managed Care team.   If you are experiencing a medical emergency, please call 911 or report to your local emergency department or urgent care.   If you have a non-emergency medical problem during routine business hours, please contact your provider's office and ask to speak with a nurse.   For questions related to your [facility], please call: [phone] or visit the homepage here: [email or web address]  If you would like to schedule transportation through your [facility], please call the following number at least 2 days in advance of your appointment: [phone]   Rides for urgent appointments can also be made after hours by calling Member Services.  Call the [place] at [phone], at any time, 24 hours a day, 7 days a week. If you are in danger or need immediate medical attention call 911.  If you would like help to quit smoking, call 1-800-QUIT-NOW 5408257014) OR Espaol: 1-855-Djelo-Ya (8-786-767-2094) o para ms informacin haga clic aqu or Text READY to 200-400 to register via text  Kathleen Rodriguez - following are the goals we discussed in your visit today:   Goals Addressed             This Visit's Progress    Protect My Health       Timeframe:  Long-Range Goal Priority:  High Start Date:        02/24/21                     Expected End Date:       ongoing            Follow Up Date: 08/01/22   - schedule appointment for flu shot - schedule appointment for vaccines needed due to my age or health - schedule  recommended health tests (blood work, mammogram, colonoscopy, pap test) - schedule and keep appointment for annual check-up   06/28/22:  Patient to schedule f/u appts.  Why is this important?   Screening tests can find diseases early when they are easier to treat.  Your doctor or nurse will talk with you about which tests are important for you.  Getting shots for common diseases like the flu and shingles will help prevent them.      Patient verbalizes understanding of instructions and care plan provided today and agrees to view in [place]. Active MyChart status and patient understanding of how to access instructions and care plan via MyChart confirmed with patient.     The Managed Medicaid care management team will reach out to the patient again over the next 30 business  days.  The  Patient  has been provided with contact information for the Managed Medicaid care management team and has been advised to call with any health related questions or concerns.   [name] RN, BSN [place]  [facility] Curator - Managed Medicaid High Risk [phone].   Following is a copy of your plan of care:  Care Plan : General Plan of Care (Adult)  Updates made by [name], RN since 06/28/2022 12:00 AM  Problem: Health Promotion or Disease Self-Management (General Plan of Care) Resolved 06/28/2022  Priority: High  Onset Date: 02/24/2021     Problem: Health Promotion or Disease Self-Management (General Plan of Care)   Priority: High  Onset Date: 02/24/2021     Long-Range Goal: Self-Management Plan Developed   Start Date: 02/24/2021  Expected End Date: 09/27/2022  Recent Progress: Not on track  Priority: High  Note:   Current Barriers:  Ineffective Self Health Maintenance Knowledge Deficits related to short term plan for care coordination needs and long term plans for chronic disease management needs 06/28/22:  Patient in ED 05/29/22 for SOB, no complaints  today.  Patient states she is moving to a hotel this weekend not because she cannot pay her rent per patient-does not elaborate.  Declines any resources at this time as she states she has spoken with legal aid.Patient to schedule f/u appts with providers.  Nurse Case Manager Clinical Goal(s):  patient will work with care management team to address care coordination and chronic disease management needs related to Disease Management Educational Needs Care Coordination Medication Management and Education Medication Reconciliation Medication Assistance  Psychosocial Support   Interventions:  Evaluation of current treatment plan and patient's adherence to plan as established by provider. Reviewed medications with patient. Collaborated with pharmacy regarding medications. Discussed plans with patient for ongoing care management follow up and provided patient with direct contact information for care management team Advised patient, providing education and rationale, to monitor blood pressure daily and record, calling provider for findings outside established parameters.  Reviewed scheduled/upcoming provider appointments. Advised patient, providing education and rationale, to check cbg, BP and record, calling provider for findings outside established parameters.   Care guide referral for financial resources for rent, medicines-07/02/21-completed-patient provided resources. Care Guide referral for transportation. Update 04/12/21:  RNCM provided transportation resources to patient. Pharmacy referral for medication review. Update 04/12/21:  Patient has met with Pharmacist and continues to follow. Self Care Activities:  Patient will self administer medications as prescribed Patient will attend all scheduled provider appointments Patient will call pharmacy for medication refills Patient will continue to perform ADL's independently Patient will call provider office for new concerns or questions Patient to  follow up with PCP regarding diabetic shoes. Patient Goals: In the next 30 days, patient will check blood pressure and blood sugars as directed by provider. In the next 30 days, patient will attend all scheduled appointments. In the next 30 days, patient will meet with Pharmacist to discuss medications.- In the next 30 days, patient will schedule follow up appointments. Follow Up Plan: The patient has been provided with contact information for the care management team and has been advised to call with any health related questions or concerns.  The care management team will reach out to the patient again over the next 30  business days.

## 2022-07-07 ENCOUNTER — Encounter (HOSPITAL_COMMUNITY): Payer: Self-pay | Admitting: Emergency Medicine

## 2022-07-07 ENCOUNTER — Emergency Department (HOSPITAL_COMMUNITY)
Admission: EM | Admit: 2022-07-07 | Discharge: 2022-07-08 | Discharge status: Against Medical Advice | Payer: Medicaid Other | Attending: Student | Admitting: Student

## 2022-07-07 ENCOUNTER — Other Ambulatory Visit: Payer: Self-pay

## 2022-07-07 ENCOUNTER — Emergency Department (HOSPITAL_COMMUNITY): Payer: Medicaid Other

## 2022-07-07 DIAGNOSIS — R0602 Shortness of breath: Secondary | ICD-10-CM | POA: Diagnosis not present

## 2022-07-07 DIAGNOSIS — R062 Wheezing: Secondary | ICD-10-CM | POA: Diagnosis not present

## 2022-07-07 DIAGNOSIS — Z5321 Procedure and treatment not carried out due to patient leaving prior to being seen by health care provider: Secondary | ICD-10-CM | POA: Diagnosis not present

## 2022-07-07 DIAGNOSIS — J45909 Unspecified asthma, uncomplicated: Secondary | ICD-10-CM | POA: Diagnosis not present

## 2022-07-07 DIAGNOSIS — R0789 Other chest pain: Secondary | ICD-10-CM | POA: Diagnosis present

## 2022-07-07 DIAGNOSIS — J80 Acute respiratory distress syndrome: Secondary | ICD-10-CM | POA: Diagnosis not present

## 2022-07-07 DIAGNOSIS — I10 Essential (primary) hypertension: Secondary | ICD-10-CM | POA: Diagnosis not present

## 2022-07-07 DIAGNOSIS — J449 Chronic obstructive pulmonary disease, unspecified: Secondary | ICD-10-CM | POA: Diagnosis not present

## 2022-07-07 DIAGNOSIS — I499 Cardiac arrhythmia, unspecified: Secondary | ICD-10-CM | POA: Diagnosis not present

## 2022-07-07 LAB — CBC WITH DIFFERENTIAL/PLATELET
Abs Immature Granulocytes: 0 10*3/uL (ref 0.00–0.07)
Basophils Absolute: 0.1 10*3/uL (ref 0.0–0.1)
Basophils Relative: 1 %
Eosinophils Absolute: 0.3 10*3/uL (ref 0.0–0.5)
Eosinophils Relative: 5 %
HCT: 44.3 % (ref 36.0–46.0)
Hemoglobin: 14.3 g/dL (ref 12.0–15.0)
Immature Granulocytes: 0 %
Lymphocytes Relative: 47 %
Lymphs Abs: 3 10*3/uL (ref 0.7–4.0)
MCH: 27.8 pg (ref 26.0–34.0)
MCHC: 32.3 g/dL (ref 30.0–36.0)
MCV: 86 fL (ref 80.0–100.0)
Monocytes Absolute: 0.7 10*3/uL (ref 0.1–1.0)
Monocytes Relative: 11 %
Neutro Abs: 2.3 10*3/uL (ref 1.7–7.7)
Neutrophils Relative %: 36 %
Platelets: 309 10*3/uL (ref 150–400)
RBC: 5.15 MIL/uL — ABNORMAL HIGH (ref 3.87–5.11)
RDW: 15.4 % (ref 11.5–15.5)
WBC: 6.3 10*3/uL (ref 4.0–10.5)
nRBC: 0 % (ref 0.0–0.2)

## 2022-07-07 LAB — BASIC METABOLIC PANEL
Anion gap: 10 (ref 5–15)
BUN: 8 mg/dL (ref 6–20)
CO2: 26 mmol/L (ref 22–32)
Calcium: 9.1 mg/dL (ref 8.9–10.3)
Chloride: 105 mmol/L (ref 98–111)
Creatinine, Ser: 0.76 mg/dL (ref 0.44–1.00)
GFR, Estimated: >60 mL/min (ref >60)
Glucose, Bld: 111 mg/dL — ABNORMAL HIGH (ref 70–99)
Potassium: 4 mmol/L (ref 3.5–5.1)
Sodium: 141 mmol/L (ref 135–145)

## 2022-07-07 LAB — TROPONIN I (HIGH SENSITIVITY): Troponin I (High Sensitivity): 7 ng/L (ref <18)

## 2022-07-07 MED ORDER — IPRATROPIUM-ALBUTEROL 0.5-2.5 (3) MG/3ML IN SOLN
3.0000 mL | Freq: Once | RESPIRATORY_TRACT | Status: AC
  Administered 2022-07-07: 3 mL via RESPIRATORY_TRACT
  Filled 2022-07-07: qty 9

## 2022-07-07 NOTE — ED Triage Notes (Signed)
Pt brought to ED by [facility] with c/o increasing shortness of breath x2 days. Has hx of Asthma and COPD. EMS reports pt was initially wheezing throughout.   EMS Interventions Duoneb x1 Solumedrol 125mg  20g IV LAC  Ems Vitals BP 157/93 HR 85 SPO2 initial 92% RA- 100% neb tx

## 2022-07-07 NOTE — ED Provider Triage Note (Signed)
Emergency Medicine Provider Triage Evaluation Note  Kathleen Rodriguez , a 56 y.o. female  was evaluated in triage.  Pt complains of chest tightness and shortness of breath x2 days.  Had a DuoNeb at home, another [employer] by EMS.  Still feels short of breath, chest tightness has been constant.  Denies nausea or vomiting.Mar[place] Kitchen  History of both COPD or asthma, denies any previous MI or ACS.  Review of Systems  Per HPI  Physical Exam  BP (!) 155/101 (BP Location: Right Arm)   Pulse 99   Temp 98.1 F (36.7 C) (Oral)   Resp 20   SpO2 97%  Gen:   Awake, no distress   Resp:  Normal effort.  Diffuse expiratory wheezing MSK:   Moves extremities without difficulty  Other:    Medical Decision Making  Medically screening exam initiated at 9:55 PM.  Appropriate orders placed.  DANELLE CURIALE was informed that the remainder of the evaluation will be completed by another provider, this initial triage assessment does not replace that evaluation, and the importance of remaining in the ED until their evaluation is complete.     Theron Arista, PA-C 07/07/22 [id number]

## 2022-07-08 LAB — TROPONIN I (HIGH SENSITIVITY): Troponin I (High Sensitivity): 7 ng/L (ref <18)

## 2022-07-08 MED ORDER — IPRATROPIUM-ALBUTEROL 0.5-2.5 (3) MG/3ML IN SOLN
3.0000 mL | Freq: Once | RESPIRATORY_TRACT | Status: DC
  Filled 2022-07-08: qty 3

## 2022-07-20 ENCOUNTER — Telehealth: Payer: Self-pay | Admitting: Nurse Practitioner

## 2022-07-20 ENCOUNTER — Other Ambulatory Visit (HOSPITAL_COMMUNITY): Payer: Self-pay

## 2022-07-20 DIAGNOSIS — J4551 Severe persistent asthma with (acute) exacerbation: Secondary | ICD-10-CM

## 2022-07-20 NOTE — Telephone Encounter (Signed)
Patient states that her son passed away and was not able to come in to get her Kathleen Rodriguez injection. Patient would like to reschedule, Devki, can you reach out to patient?  Patient was put on Breo by the ED and she is wanting albuterol called in to [facility] [address]. She is also wanting a new handicap placard filled out.   Please advise.

## 2022-07-22 MED ORDER — ALBUTEROL SULFATE HFA 108 (90 BASE) MCG/ACT IN AERS: INHALATION_SPRAY | RESPIRATORY_TRACT | 3 refills | Status: DC

## 2022-07-22 NOTE — Telephone Encounter (Signed)
Spoke with the pt  I refilled her albuterol inhaler and scheduled her for rov with Katie for 07/28/22 at 9 am  She is aware to keep appt to discuss being started on breo and recent hospital visit   Devki, she wants to get her fasenra going again, please reach out to her, thanks so much!

## 2022-07-28 ENCOUNTER — Ambulatory Visit: Payer: Medicaid Other

## 2022-07-28 ENCOUNTER — Other Ambulatory Visit: Payer: Self-pay | Admitting: Obstetrics and Gynecology

## 2022-07-28 ENCOUNTER — Ambulatory Visit: Payer: Medicaid Other | Admitting: Nurse Practitioner

## 2022-07-28 NOTE — Patient Outreach (Signed)
Care Coordination  07/28/2022  Kathleen Rodriguez Mar 10, 1966 [id number]  RNCM called patient at scheduled time.  Patient answered and asked to be called another day.  RNCM rescheduled appointment at patient request.  [name] RN, [place] Management Coordinator - Managed [place] High Risk [phone].

## 2022-08-01 NOTE — Telephone Encounter (Signed)
Patient called back wondering about Fasenra injection. Please advise.

## 2022-08-01 NOTE — Telephone Encounter (Signed)
Returned call to patient. Scheduled for Fasenra new start on 08/04/22. Her medication may be in [place], PharmD, MPH, BCPS, CPP Clinical Pharmacist (Rheumatology and Pulmonology)

## 2022-08-01 NOTE — Telephone Encounter (Signed)
See encounter from 09/27. Will close this encounter.

## 2022-08-04 ENCOUNTER — Other Ambulatory Visit: Payer: Medicaid Other | Admitting: Pharmacist

## 2022-08-04 NOTE — Telephone Encounter (Signed)
Patient no show to [name] new start visit today with pharmacy clinic. She has never started [place]. Has no-showed all new start visits. For this reason, pharmacy team will not be reaching back out to pt to r/s. Please notify us if interested in future again  [place], PharmD, MPH, BCPS, CPP Clinical Pharmacist (Rheumatology and Pulmonology)

## 2022-08-08 ENCOUNTER — Ambulatory Visit: Payer: Medicaid Other | Admitting: Nurse Practitioner

## 2022-08-08 ENCOUNTER — Telehealth: Payer: Self-pay | Admitting: Nurse Practitioner

## 2022-08-08 MED ORDER — PREDNISONE 10 MG PO TABS: 40.0000 mg | ORAL_TABLET | Freq: Every day | ORAL | 0 refills | Status: AC

## 2022-08-08 NOTE — Telephone Encounter (Signed)
Primary Pulmonologist: VS Last office visit and with whom: 02/10/22 [facility] What do we see them for (pulmonary problems):  Severe persistent asthma with acute exacerbation  COPD mixed type (HCC)  OSA (obstructive sleep apnea)  Seasonal allergic rhinitis due to pollen  Gastroesophageal reflux disease without esophagitis  Last OV assessment/plan: Severe persistent asthma with acute exacerbation Multiple hospitalizations requiring BiPAP over the past year. Recurrent exacerbation today after completing prednisone taper. Will treat with another taper and then start daily prednisone 10 mg until we can get her started back on Fasenra. Eosinophils 400. Hopefully at that point, we can wean off daily use. Message sent to pharmacy to restart therapy. Step up to triple therapy with Brezti; samples provided today. Continue singulair and claritin for trigger prevention. Change duonebs to albuterol nebs with the addition of LAMA.    Patient Instructions  Stop Symbicort. Start Breztri 2 puffs Twice daily. Brush tongue and rinse mouth afterwards Continue Albuterol inhaler 2 puffs or 3 mL neb every 6 hours as needed for shortness of breath or wheezing. Notify if symptoms persist despite rescue inhaler/neb use. Continue flonase 1-2 sprays each nostril daily Continue Claritin 10 mg daily for allergies Continue singulair 10 mg At bedtime  Continue protonix 40 mg daily    Prednisone taper. 4 tabs for 2 days, then 3 tabs for 2 days, 2 tabs for 2 days, then 1 tab for 2 days, then start daily 10 mg of prednisone Restart fasenra injections - we will send a message to the pharmacy to get these restarted and be in touch    Urinalysis today.   Continue to use CPAP every night, minimum of 4-6 hours a night.  Change equipment every 30 days or as directed by DME. Wash your tubing with warm soap and water daily, hang to dry. Wash humidifier portion weekly.  Be aware of reduced alertness and do not drive or operate heavy  machinery if experiencing this or drowsiness.  Exercise encouraged, as tolerated. Avoid or decrease alcohol consumption and medications that make you more sleepy, if possible. Notify if persistent daytime sleepiness occurs even with consistent use of CPAP.   Bring SD card with you at next visit.   Follow up in 2 weeks with Dr. Craige Cotta or Philis Nettle. If symptoms do not improve or worsen, please contact office for sooner follow up or seek emergency care.       OSA (obstructive sleep apnea) Unable to obtain download today. Pt reported compliance some nights. Advised she bring SD card so we can assess usage/control at her follow up.    COPD mixed type (HCC) See above plan. Adding LAMA to therapy.    Seasonal allergic rhinitis Well-controlled on current regimen. Refills sent today.   GERD (gastroesophageal reflux disease) Well-controlled on PPI. Refilled today.    Was appointment offered to patient (explain)?  Pt scheduled for first available that pt could attend on [facility]15/[facility] with [facility]   Reason for call: Pt states she is having increased SOB/ wheezing. Pt states is having to use Albuterol nebulizer first thing in the morning and uses both Albuterol HFA and partial Albuterol 3 - 4 times throughout day. Pt does state that she is using Breo daily although that is not listed on current med list. Pt states she is not on any Prednisone at this time. Katie please advise. Thank you.   (examples of things to ask: : When did symptoms start? Fever? Cough? Productive? Color to sputum? More sputum than usual? Wheezing? Have  you needed increased oxygen? Are you taking your respiratory medications? What over the counter measures have you tried?)  Allergies  Allergen Reactions   Tomato Hives, Itching and Other (See Comments)    ALSO REACTS TO KETCHUP   Latex Itching and Rash   Wool Alcohol [Lanolin] Itching    Immunization History  Administered Date(s) Administered   Influenza,inj,Quad PF,6+ Mos  08/13/2013, 07/27/2015, 07/21/2016, 10/22/2017, 08/01/2018, 06/17/2019, 10/29/2020   Moderna Sars-Covid-2 Vaccination 12/17/2019, 01/21/2021   PNEUMOCOCCAL CONJUGATE-20 03/02/2022   Pneumococcal Polysaccharide-23 06/17/2019   Tdap 08/14/2013   Zoster Recombinat (Shingrix) 12/02/2020, 03/18/2021

## 2022-08-08 NOTE — Telephone Encounter (Signed)
Spoke with pt and reviewed medication and instructions with pt. Pt is scheduled for OV with Katie on 08/15/22. Pt advise if symptoms worsen to seek medical evaluation at [facility] or ED. Pt stated understanding. [facility] [address]. Nothing further needed at this time.

## 2022-08-08 NOTE — Telephone Encounter (Signed)
Prednisone 40 mg daily for 5 days. Take in AM with food. Needs OV. She has not been seen since April and was supposed to have 2 week follow up. Thanks.

## 2022-08-15 ENCOUNTER — Ambulatory Visit: Payer: Medicaid Other | Admitting: Nurse Practitioner

## 2022-08-25 ENCOUNTER — Ambulatory Visit: Payer: Self-pay

## 2022-08-25 NOTE — Telephone Encounter (Signed)
Message from Shan Levans sent at 08/25/2022  9:37 AM EDT  Summary: Yeast infection   Patient states that she has been given prednisone and when she usually takes prednisone she gets a yeast infection and pcp will give her fluconazole (DIFLUCAN) 150 MG tablet to help. No appointments available.         Chief Complaint: vaginal odor and severe itching Symptoms: no other sx Frequency: few days Pertinent Negatives: Patient denies fever, vaginal bleeding, pain with urination Disposition: [] ED /[] Urgent Care (no appt availability in office) / [] Appointment(In office/virtual)/ []  [facility]/ [] Home Care/ [x] Refused Recommended Disposition /[] [facility]/ []  Follow-up with PCP Additional Notes: pt stated that she gets vaginal infection every time she is on Prednisone. Pt asking for RX to be [facility] [place].   Reason for Disposition  [1] Vaginal itching AND [2] not improved > 3 days following Care Advice  Answer Assessment - Initial Assessment Questions 1. SYMPTOM: "What's the main symptom you're concerned about?" (e.g., pain, itching, dryness)     Itch, smell 2. LOCATION: "Where is the  sx located?" (e.g., inside/outside, left/right)     outside 3. ONSET: "When did the  infection  start?"     While on prednisone 4. PAIN: "Is there any pain?" If Yes, ask: "How bad is it?" (Scale: 1-10; mild, moderate, severe)   -  MILD (1-3): Doesn't interfere with normal activities.    -  MODERATE (4-7): Interferes with normal activities (e.g., work or school) or awakens from sleep.     -  SEVERE (8-10): Excruciating pain, unable to do any normal activities.     no 5. ITCHING: "Is there any itching?" If Yes, ask: "How bad is it?" (Scale: 1-10; mild, moderate, severe)     severe 6. CAUSE: "What do you think is causing the discharge?" "Have you had the same problem before? What happened then?"     Yeast infection 7. OTHER SYMPTOMS: "Do you have any other symptoms?"  (e.g., fever, itching, vaginal bleeding, pain with urination, injury to genital area, vaginal foreign body)     N/a 8. PREGNANCY: "Is there any chance you are pregnant?" "When was your last menstrual period?"     N/a  Protocols used: Vaginal Symptoms-A-AH

## 2022-08-26 NOTE — Telephone Encounter (Signed)
Advised patient to f/u with the providers that prescribed prednisone prescription. Advise that she would need to have an apt for this request. Last OV was greater than 1 yr ago.  She has an OV schedule 09/22/2022 with Provider [place].

## 2022-08-27 ENCOUNTER — Encounter (HOSPITAL_COMMUNITY): Payer: Self-pay | Admitting: Internal Medicine

## 2022-08-27 ENCOUNTER — Emergency Department (HOSPITAL_COMMUNITY): Payer: Medicaid Other

## 2022-08-27 ENCOUNTER — Inpatient Hospital Stay (HOSPITAL_COMMUNITY)
Admission: EM | Admit: 2022-08-27 | Discharge: 2022-08-30 | DRG: 191 | Disposition: A | Discharge status: Home / Self Care | Payer: Medicaid Other | Attending: Internal Medicine | Admitting: Internal Medicine

## 2022-08-27 ENCOUNTER — Other Ambulatory Visit: Payer: Self-pay

## 2022-08-27 DIAGNOSIS — E876 Hypokalemia: Secondary | ICD-10-CM | POA: Diagnosis present

## 2022-08-27 DIAGNOSIS — E1165 Type 2 diabetes mellitus with hyperglycemia: Secondary | ICD-10-CM | POA: Diagnosis present

## 2022-08-27 DIAGNOSIS — K219 Gastro-esophageal reflux disease without esophagitis: Secondary | ICD-10-CM | POA: Diagnosis present

## 2022-08-27 DIAGNOSIS — Z91018 Allergy to other foods: Secondary | ICD-10-CM

## 2022-08-27 DIAGNOSIS — Z7982 Long term (current) use of aspirin: Secondary | ICD-10-CM

## 2022-08-27 DIAGNOSIS — J441 Chronic obstructive pulmonary disease with (acute) exacerbation: Secondary | ICD-10-CM | POA: Diagnosis not present

## 2022-08-27 DIAGNOSIS — Z8249 Family history of ischemic heart disease and other diseases of the circulatory system: Secondary | ICD-10-CM

## 2022-08-27 DIAGNOSIS — R0789 Other chest pain: Secondary | ICD-10-CM | POA: Diagnosis not present

## 2022-08-27 DIAGNOSIS — Z720 Tobacco use: Secondary | ICD-10-CM | POA: Diagnosis present

## 2022-08-27 DIAGNOSIS — R0602 Shortness of breath: Secondary | ICD-10-CM | POA: Diagnosis not present

## 2022-08-27 DIAGNOSIS — J4541 Moderate persistent asthma with (acute) exacerbation: Secondary | ICD-10-CM | POA: Diagnosis not present

## 2022-08-27 DIAGNOSIS — J4551 Severe persistent asthma with (acute) exacerbation: Secondary | ICD-10-CM | POA: Diagnosis present

## 2022-08-27 DIAGNOSIS — Z20822 Contact with and (suspected) exposure to covid-19: Secondary | ICD-10-CM | POA: Diagnosis present

## 2022-08-27 DIAGNOSIS — N898 Other specified noninflammatory disorders of vagina: Secondary | ICD-10-CM | POA: Diagnosis present

## 2022-08-27 DIAGNOSIS — Z79899 Other long term (current) drug therapy: Secondary | ICD-10-CM

## 2022-08-27 DIAGNOSIS — J209 Acute bronchitis, unspecified: Secondary | ICD-10-CM | POA: Diagnosis present

## 2022-08-27 DIAGNOSIS — R4702 Dysphasia: Secondary | ICD-10-CM | POA: Diagnosis not present

## 2022-08-27 DIAGNOSIS — R062 Wheezing: Secondary | ICD-10-CM | POA: Diagnosis not present

## 2022-08-27 DIAGNOSIS — Z96652 Presence of left artificial knee joint: Secondary | ICD-10-CM | POA: Diagnosis present

## 2022-08-27 DIAGNOSIS — R0689 Other abnormalities of breathing: Secondary | ICD-10-CM | POA: Diagnosis not present

## 2022-08-27 DIAGNOSIS — R7303 Prediabetes: Secondary | ICD-10-CM

## 2022-08-27 DIAGNOSIS — Z23 Encounter for immunization: Secondary | ICD-10-CM

## 2022-08-27 DIAGNOSIS — F17210 Nicotine dependence, cigarettes, uncomplicated: Secondary | ICD-10-CM | POA: Diagnosis present

## 2022-08-27 DIAGNOSIS — E785 Hyperlipidemia, unspecified: Secondary | ICD-10-CM | POA: Diagnosis present

## 2022-08-27 DIAGNOSIS — Z91048 Other nonmedicinal substance allergy status: Secondary | ICD-10-CM

## 2022-08-27 DIAGNOSIS — F4320 Adjustment disorder, unspecified: Secondary | ICD-10-CM | POA: Diagnosis present

## 2022-08-27 DIAGNOSIS — Z7951 Long term (current) use of inhaled steroids: Secondary | ICD-10-CM

## 2022-08-27 DIAGNOSIS — F4321 Adjustment disorder with depressed mood: Secondary | ICD-10-CM

## 2022-08-27 DIAGNOSIS — Z91040 Latex allergy status: Secondary | ICD-10-CM

## 2022-08-27 DIAGNOSIS — Z7984 Long term (current) use of oral hypoglycemic drugs: Secondary | ICD-10-CM

## 2022-08-27 DIAGNOSIS — Z825 Family history of asthma and other chronic lower respiratory diseases: Secondary | ICD-10-CM

## 2022-08-27 DIAGNOSIS — R079 Chest pain, unspecified: Secondary | ICD-10-CM | POA: Diagnosis not present

## 2022-08-27 DIAGNOSIS — I10 Essential (primary) hypertension: Secondary | ICD-10-CM | POA: Diagnosis present

## 2022-08-27 DIAGNOSIS — E7439 Other disorders of intestinal carbohydrate absorption: Secondary | ICD-10-CM | POA: Diagnosis present

## 2022-08-27 DIAGNOSIS — G4733 Obstructive sleep apnea (adult) (pediatric): Secondary | ICD-10-CM | POA: Diagnosis present

## 2022-08-27 LAB — CBC WITH DIFFERENTIAL/PLATELET
Abs Immature Granulocytes: 0.01 10*3/uL (ref 0.00–0.07)
Basophils Absolute: 0.1 10*3/uL (ref 0.0–0.1)
Basophils Relative: 1 %
Eosinophils Absolute: 0.2 10*3/uL (ref 0.0–0.5)
Eosinophils Relative: 3 %
HCT: 41.6 % (ref 36.0–46.0)
Hemoglobin: 13.5 g/dL (ref 12.0–15.0)
Immature Granulocytes: 0 %
Lymphocytes Relative: 53 %
Lymphs Abs: 4.4 10*3/uL — ABNORMAL HIGH (ref 0.7–4.0)
MCH: 28.4 pg (ref 26.0–34.0)
MCHC: 32.5 g/dL (ref 30.0–36.0)
MCV: 87.6 fL (ref 80.0–100.0)
Monocytes Absolute: 0.7 10*3/uL (ref 0.1–1.0)
Monocytes Relative: 9 %
Neutro Abs: 2.8 10*3/uL (ref 1.7–7.7)
Neutrophils Relative %: 34 %
Platelets: 328 10*3/uL (ref 150–400)
RBC: 4.75 MIL/uL (ref 3.87–5.11)
RDW: 17.9 % — ABNORMAL HIGH (ref 11.5–15.5)
WBC: 8.2 10*3/uL (ref 4.0–10.5)
nRBC: 0 % (ref 0.0–0.2)

## 2022-08-27 LAB — RESPIRATORY PANEL BY PCR

## 2022-08-27 LAB — GLUCOSE, CAPILLARY: Glucose-Capillary: 220 mg/dL — ABNORMAL HIGH (ref 70–99)

## 2022-08-27 LAB — BASIC METABOLIC PANEL
Anion gap: 7 (ref 5–15)
BUN: 15 mg/dL (ref 6–20)
CO2: 27 mmol/L (ref 22–32)
Calcium: 8.5 mg/dL — ABNORMAL LOW (ref 8.9–10.3)
Chloride: 104 mmol/L (ref 98–111)
Creatinine, Ser: 0.8 mg/dL (ref 0.44–1.00)
GFR, Estimated: >60 mL/min (ref >60)
Glucose, Bld: 202 mg/dL — ABNORMAL HIGH (ref 70–99)
Potassium: 3.2 mmol/L — ABNORMAL LOW (ref 3.5–5.1)
Sodium: 138 mmol/L (ref 135–145)

## 2022-08-27 LAB — HEMOGLOBIN A1C
Hgb A1c MFr Bld: 5.5 % (ref 4.8–5.6)
Mean Plasma Glucose: 111.15 mg/dL

## 2022-08-27 LAB — RESP PANEL BY RT-PCR (FLU A&B, COVID) ARPGX2
Influenza A by PCR: NEGATIVE
Influenza B by PCR: NEGATIVE
SARS Coronavirus 2 by RT PCR: NEGATIVE

## 2022-08-27 LAB — PROCALCITONIN: Procalcitonin: <0.1 ng/mL

## 2022-08-27 LAB — PHOSPHORUS: Phosphorus: 3.4 mg/dL (ref 2.5–4.6)

## 2022-08-27 MED ORDER — IPRATROPIUM-ALBUTEROL 0.5-2.5 (3) MG/3ML IN SOLN
3.0000 mL | Freq: Four times a day (QID) | RESPIRATORY_TRACT | Status: DC
  Administered 2022-08-27 – 2022-08-29 (×5): 3 mL via RESPIRATORY_TRACT
  Filled 2022-08-27 (×5): qty 3

## 2022-08-27 MED ORDER — ONDANSETRON HCL 4 MG PO TABS: 4.0000 mg | ORAL_TABLET | Freq: Four times a day (QID) | ORAL | Status: DC | PRN

## 2022-08-27 MED ORDER — FLUCONAZOLE 150 MG PO TABS
150.0000 mg | ORAL_TABLET | Freq: Once | ORAL | Status: AC
  Administered 2022-08-27: 150 mg via ORAL
  Filled 2022-08-27: qty 1

## 2022-08-27 MED ORDER — METHYLPREDNISOLONE SODIUM SUCC 40 MG IJ SOLR
40.0000 mg | Freq: Two times a day (BID) | INTRAMUSCULAR | Status: AC
  Administered 2022-08-27: 40 mg via INTRAVENOUS
  Filled 2022-08-27: qty 1

## 2022-08-27 MED ORDER — ACETAMINOPHEN 650 MG RE SUPP: 650.0000 mg | Freq: Four times a day (QID) | RECTAL | Status: DC | PRN

## 2022-08-27 MED ORDER — ALBUTEROL SULFATE (2.5 MG/3ML) 0.083% IN NEBU: 2.5000 mg | INHALATION_SOLUTION | RESPIRATORY_TRACT | Status: DC | PRN

## 2022-08-27 MED ORDER — IPRATROPIUM-ALBUTEROL 0.5-2.5 (3) MG/3ML IN SOLN
3.0000 mL | Freq: Once | RESPIRATORY_TRACT | Status: AC
  Administered 2022-08-27: 3 mL via RESPIRATORY_TRACT
  Filled 2022-08-27: qty 3

## 2022-08-27 MED ORDER — INSULIN ASPART 100 UNIT/ML IJ SOLN
0.0000 [IU] | Freq: Three times a day (TID) | INTRAMUSCULAR | Status: DC
  Administered 2022-08-28: 2 [IU] via SUBCUTANEOUS
  Administered 2022-08-28: 5 [IU] via SUBCUTANEOUS
  Administered 2022-08-28: 8 [IU] via SUBCUTANEOUS
  Administered 2022-08-29 – 2022-08-30 (×2): 3 [IU] via SUBCUTANEOUS

## 2022-08-27 MED ORDER — GABAPENTIN 300 MG PO CAPS
300.0000 mg | ORAL_CAPSULE | Freq: Every day | ORAL | Status: DC | PRN
  Administered 2022-08-27: 300 mg via ORAL
  Filled 2022-08-27 (×2): qty 1

## 2022-08-27 MED ORDER — MONTELUKAST SODIUM 10 MG PO TABS
10.0000 mg | ORAL_TABLET | Freq: Every day | ORAL | Status: DC
  Administered 2022-08-27 – 2022-08-29 (×3): 10 mg via ORAL
  Filled 2022-08-27 (×3): qty 1

## 2022-08-27 MED ORDER — ENOXAPARIN SODIUM 40 MG/0.4ML IJ SOSY
40.0000 mg | PREFILLED_SYRINGE | INTRAMUSCULAR | Status: DC
  Administered 2022-08-27 – 2022-08-29 (×3): 40 mg via SUBCUTANEOUS
  Filled 2022-08-27 (×3): qty 0.4

## 2022-08-27 MED ORDER — PANTOPRAZOLE SODIUM 40 MG PO TBEC
40.0000 mg | DELAYED_RELEASE_TABLET | Freq: Every day | ORAL | Status: DC
  Administered 2022-08-27 – 2022-08-30 (×4): 40 mg via ORAL
  Filled 2022-08-27 (×4): qty 1

## 2022-08-27 MED ORDER — ASPIRIN 81 MG PO TBEC
81.0000 mg | DELAYED_RELEASE_TABLET | Freq: Every day | ORAL | Status: DC
  Administered 2022-08-28 – 2022-08-30 (×3): 81 mg via ORAL
  Filled 2022-08-27 (×3): qty 1

## 2022-08-27 MED ORDER — ACETAMINOPHEN 325 MG PO TABS
650.0000 mg | ORAL_TABLET | Freq: Four times a day (QID) | ORAL | Status: DC | PRN
  Administered 2022-08-27: 650 mg via ORAL
  Filled 2022-08-27: qty 2

## 2022-08-27 MED ORDER — METHOCARBAMOL 500 MG PO TABS
500.0000 mg | ORAL_TABLET | Freq: Once | ORAL | Status: AC
  Administered 2022-08-27: 500 mg via ORAL
  Filled 2022-08-27: qty 1

## 2022-08-27 MED ORDER — HYDROCODONE-ACETAMINOPHEN 5-325 MG PO TABS
1.0000 | ORAL_TABLET | Freq: Once | ORAL | Status: AC
  Administered 2022-08-27: 1 via ORAL
  Filled 2022-08-27: qty 1

## 2022-08-27 MED ORDER — ALBUTEROL SULFATE HFA 108 (90 BASE) MCG/ACT IN AERS: 2.0000 | INHALATION_SPRAY | RESPIRATORY_TRACT | Status: DC | PRN

## 2022-08-27 MED ORDER — METFORMIN HCL 500 MG PO TABS
500.0000 mg | ORAL_TABLET | Freq: Two times a day (BID) | ORAL | Status: DC
  Administered 2022-08-28 – 2022-08-30 (×5): 500 mg via ORAL
  Filled 2022-08-27 (×5): qty 1

## 2022-08-27 MED ORDER — CLONIDINE HCL 0.2 MG PO TABS
0.2000 mg | ORAL_TABLET | Freq: Two times a day (BID) | ORAL | Status: DC
  Administered 2022-08-27 – 2022-08-30 (×6): 0.2 mg via ORAL
  Filled 2022-08-27 (×6): qty 1

## 2022-08-27 MED ORDER — PREDNISONE 20 MG PO TABS
40.0000 mg | ORAL_TABLET | Freq: Every day | ORAL | Status: DC
  Administered 2022-08-28: 40 mg via ORAL
  Filled 2022-08-27: qty 2

## 2022-08-27 MED ORDER — BUSPIRONE HCL 5 MG PO TABS
10.0000 mg | ORAL_TABLET | Freq: Two times a day (BID) | ORAL | Status: DC
  Administered 2022-08-27 – 2022-08-30 (×6): 10 mg via ORAL
  Filled 2022-08-27 (×6): qty 2

## 2022-08-27 MED ORDER — LORATADINE 10 MG PO TABS
10.0000 mg | ORAL_TABLET | Freq: Every day | ORAL | Status: DC
  Administered 2022-08-27 – 2022-08-30 (×4): 10 mg via ORAL
  Filled 2022-08-27 (×4): qty 1

## 2022-08-27 MED ORDER — MAGNESIUM SULFATE 2 GM/50ML IV SOLN
2.0000 g | Freq: Once | INTRAVENOUS | Status: AC
  Administered 2022-08-27: 2 g via INTRAVENOUS
  Filled 2022-08-27: qty 50

## 2022-08-27 MED ORDER — ONDANSETRON HCL 4 MG/2ML IJ SOLN: 4.0000 mg | Freq: Four times a day (QID) | INTRAMUSCULAR | Status: DC | PRN

## 2022-08-27 MED ORDER — POTASSIUM CHLORIDE CRYS ER 20 MEQ PO TBCR
40.0000 meq | EXTENDED_RELEASE_TABLET | Freq: Two times a day (BID) | ORAL | Status: AC
  Administered 2022-08-27 – 2022-08-28 (×2): 40 meq via ORAL
  Filled 2022-08-27 (×2): qty 2

## 2022-08-27 NOTE — Progress Notes (Signed)
RT setup CPAP QHS for pt.  Pt states that she will call if assistance is needed. Machine ready at bedside, RN aware.

## 2022-08-27 NOTE — ED Triage Notes (Signed)
Ems brings pt in from home for shortness of breath. States pt she has been out of her inhaler since yesterday.

## 2022-08-27 NOTE — H&P (Signed)
History and Physical    Patient: Kathleen Rodriguez LZJ:[id number] DOB: 27-Dec-1965 DOA: 08/27/2022 DOS: the patient was seen and examined on 08/27/2022 PCP: [name], MD  Patient coming from: Home  Chief Complaint:  Chief Complaint  Patient presents with   Shortness of Breath   HPI: Kathleen Rodriguez is a 56 y.o. female with medical history significant of osteoarthritis, asthma, COPD, intubated once in 0240, diastolic dysfunction, history of cocaine abuse, critical illness myopathy, type 2 diabetes, GERD hypertension, influenza B, OSA on CPAP, history of pneumonia, former tobacco use until recently who is brought to the emergency department due to progressively worse dyspnea, fatigue and wheezing for the past 3 days after she got off of prednisone that was given to her for an early attack  She has smoked some cigarettes as she has been mourning the death of her son earlier this year.  She also is off montelukast, loratadine and PPI.  Recent construction near work has made symptoms worse. .  She received 2 DuoNebs.  No fever, chills or night sweats.  No chest pain, palpitations, diaphoresis, PND, orthopnea or recent pitting edema of the lower extremities.  No appetite changes, abdominal pain, diarrhea, constipation, melena or hematochezia.  No flank pain, dysuria, frequency or hematuria.  No polyuria, polydipsia, polyphagia or blurred vision.  ED course: Initial vital signs were temperature 98 F, pulse 67, respiration 20 and BP 137/81 mmHg with an O2 sat of 97%.  She received magnesium sulfate 2 g IVPB and another DuoNeb in the emergency department.  Lab work: Coronavirus and influenza PCR was normal.  CBC with a white count of 8.2, hemoglobin 13.5 g/dL platelets 328.  Phosphorus was normal.  CMP with a potassium of 3.4 and glucose of 202.  Imaging: Portable 1 view chest radiograph with mild bronchitic changes.   Review of Systems: As mentioned in the history of present illness. All other  systems reviewed and are negative. Past Medical History:  Diagnosis Date   Arthritis    "knees, lower back; legs, ankles" (01/27/2016)   Asthma    followed by Dr. Halford Chessman   Asthma    CHF (congestive heart failure) ([place]) 2016   "when I went into a coma"   Cocaine abuse ([place])    Critical illness myopathy 01/2013   Diabetes mellitus without complication ([place])    GERD (gastroesophageal reflux disease)    Hypertension    "doctor took me off RX in 2016" (01/27/2016)   Hypertension    Influenza B 97/3532   Complicated by multi-organ failure   OSA on CPAP "since " 03/20/2013   Pneumonia 2016   Required emergent intubation    asthma exacerbation in 2016   Skin ulcer ([place]) secondary to bullous impetigo /trauma 08/29/2018   Sleep apnea    Tobacco abuse    Upper airway cough syndrome    Past Surgical History:  Procedure Laterality Date   BREAST SURGERY Right 1980   as a teenager , cyst was benign   CESAREAN SECTION  2006   LACERATION REPAIR Right ~ 1997   "tried to cut myself"   TOTAL KNEE ARTHROPLASTY Left 09/01/2016   Procedure: LEFT TOTAL KNEE ARTHROPLASTY;  Surgeon: Leandrew Koyanagi, MD;  Location: [facility];  Service: Orthopedics;  Laterality: Left;   TUBAL LIGATION  2006   Social History:  reports that she quit smoking about 9 years ago. Her smoking use included cigarettes. She started smoking about 30 years ago. She has a 5.25 pack-year  smoking history. She has never used smokeless tobacco. She reports that she does not currently use drugs after having used the following drugs: Cocaine and "Crack" cocaine. No history on file for alcohol use.  Allergies  Allergen Reactions   Tomato Hives, Itching and Other (See Comments)    ALSO REACTS TO KETCHUP   Latex Itching and Rash   Wool Alcohol [Lanolin] Itching    Family History  Problem Relation Age of Onset   Asthma Mother    Heart murmur Mother    Cancer Maternal Grandmother    Heart disease Maternal Grandmother    Hypertension Maternal  Grandmother    Cancer Paternal Grandmother    Hypertension Mother    HIV/AIDS Father     Prior to Admission medications   Medication Sig Start Date End Date Taking? Authorizing Provider  albuterol (PROAIR HFA) 108 (90 Base) MCG/ACT inhaler TAKE 2 PUFFS BY MOUTH EVERY 6 HOURS AS NEEDED FOR WHEEZE OR SHORTNESS OF BREATH Patient taking differently: Inhale 2 puffs into the lungs every 6 (six) hours as needed for shortness of breath. 07/22/22  Yes Chesley Mires, MD  albuterol (PROVENTIL) (2.5 MG/3ML) 0.083% nebulizer solution Take 3 mLs (2.5 mg total) by nebulization every 4 (four) hours as needed for wheezing or shortness of breath. 02/10/22  Yes Cobb, Karie Schwalbe, NP  Budeson-Glycopyrrol-Formoterol (BREZTRI AEROSPHERE) 160-9-4.8 MCG/ACT AERO Inhale 2 puffs into the lungs in the morning and at bedtime. 02/10/22  Yes Cobb, Karie Schwalbe, NP  cloNIDine (CATAPRES) 0.2 MG tablet Take 1 tablet (0.2 mg total) by mouth 2 (two) times daily. For BP and hot flashes 01/18/22  Yes Charlynne Cousins, MD  fluticasone [facility]) 50 MCG/ACT nasal spray Place 1 spray into both nostrils daily as needed for allergies. 02/10/22  Yes Cobb, Karie Schwalbe, NP  gabapentin (NEURONTIN) 300 MG capsule TAKE 1 CAPSULE BY MOUTH TWICE A DAY Patient taking differently: Take 300 mg by mouth daily as needed (For pain). 05/10/22  Yes [name], MD  ibuprofen (ADVIL) 800 MG tablet Take 800 mg by mouth every 8 (eight) hours as needed (for pain).   Yes [provider]  metFORMIN (GLUCOPHAGE) 500 MG tablet TAKE 1 TABLET BY MOUTH 2 TIMES DAILY WITH A MEAL. Patient taking differently: Take 500 mg by mouth 2 (two) times daily with a meal. 02/09/22  Yes [name], MD  aspirin 81 MG EC tablet Take 1 tablet (81 mg total) by mouth daily. Patient not taking: Reported on 08/27/2022 02/07/19   [name], MD  Blood Glucose Monitoring Suppl (ACCU-CHEK GUIDE ME) w/Device KIT Use to check blood sugar once daily. 05/04/21   [name], MD  glucose blood (ACCU-CHEK GUIDE) test strip Use as instructed daily before breakfast. 10/05/21   [name], MD  loratadine (CLARITIN) 10 MG tablet Take 1 tablet (10 mg total) by mouth daily. Patient not taking: Reported on 08/27/2022 10/02/21   Mick Sell, PA-C  montelukast (SINGULAIR) 10 MG tablet Take 1 tablet (10 mg total) by mouth at bedtime. Patient not taking: Reported on 08/27/2022 02/10/22   Clayton Bibles, NP  pantoprazole (PROTONIX) 40 MG tablet Take 1 tablet (40 mg total) by mouth daily. Patient not taking: Reported on 08/27/2022 02/10/22   Clayton Bibles, NP    Physical Exam: Vitals:   08/27/22 1447 08/27/22 1503 08/27/22 1530 08/27/22 1545  BP:   137/81 (!) 150/87  Pulse:   67 (!) 102  Resp:    20  Temp:  98  F (36.7 C)    SpO2: 100%  97% 94%   Physical Exam Vitals and nursing note reviewed.  Constitutional:      Appearance: She is well-developed. She is obese.  HENT:     Head: Normocephalic.     Nose: No rhinorrhea.     Mouth/Throat:     Mouth: Mucous membranes are moist.     Pharynx: No oropharyngeal exudate.  Eyes:     General: No scleral icterus. Neck:     Vascular: No JVD.  Cardiovascular:     Rate and Rhythm: Normal rate and regular rhythm.  Pulmonary:     Effort: No tachypnea or accessory muscle usage.     Breath sounds: Decreased breath sounds and wheezing present. No rhonchi or rales.  Abdominal:     General: Bowel sounds are normal.     Palpations: Abdomen is soft.     Tenderness: There is no abdominal tenderness. There is no guarding.  Musculoskeletal:     Cervical back: Neck supple.     Right lower leg: No edema.     Left lower leg: No edema.  Skin:    General: Skin is warm and dry.  Neurological:     General: No focal deficit present.     Mental Status: She is alert and oriented to person, place, and time.  Psychiatric:        Behavior: Behavior normal.    Data Reviewed:  Results are pending, will review when  available.  Assessment and Plan: Principal Problem:   COPD with acute exacerbation ([place])   Severe persistent asthma with acute exacerbation Observation/telemetry Continue supplemental oxygen. Methylprednisolone 40 mg IVP x1. Followed by prednisone 40 mg p.o. daily in a.m. Scheduled and as needed bronchodilators. Resume montelukast 10 mg at bedtime. Resume loratadine 10 mg in the day. Patient to avoid allergens. Follow-up CBC and chemistry in the morning.   Active Problems:   OSA (obstructive sleep apnea) CPAP at bedtime.    GERD (gastroesophageal reflux disease) Resume PPI.    Tobacco abuse Has been a sporadic. Nicotine replacement therapy as needed.    Hypokalemia Replacement ordered.   Magnesium supplemented. Follow potassium level.    Essential hypertension Has been on clonidine 0.2 mg p.o. twice daily. We will continue for now, but taper should be considered. Consider starting dihydropyridine as an outpatient.    Grief at loss of child Has been very anxious. Following closely with counseling. Trial of buspirone 10 mg p.o. twice daily. Follow-up with primary care provider and behavioral health    Itching in the vaginal area Patient requested Diflucan single dose. This has worked for her in the past. Blood glucose control.    Glucose intolerance Check hemoglobin A1c. Carbohydrate modified diet. CBG monitoring with [facility] SS while on glucocorticoids.    Advance Care Planning:   Code Status: Full Code   Consults:   Family Communication:   Severity of Illness: The appropriate patient status for this patient is OBSERVATION. Observation status is judged to be reasonable and necessary in order to provide the required intensity of service to ensure the patient's safety. The patient's presenting symptoms, physical exam findings, and initial radiographic and laboratory data in the context of their medical condition is felt to place them at decreased risk for further  clinical deterioration. Furthermore, it is anticipated that the patient will be medically stable for discharge from the hospital within 2 midnights of admission.   Author: Reubin Milan, MD 08/27/2022 4:50 PM  For on call review [email or web address].   This document was prepared using Dragon voice recognition software and may contain some unintended transcription errors.

## 2022-08-27 NOTE — ED Provider Notes (Signed)
[facility] DEPT Provider Note   CSN: [id number] Arrival date & time: 08/27/22  1434     History  Chief Complaint  Patient presents with   Shortness of Breath    Kathleen Rodriguez is a 56 y.o. female.   Shortness of Breath    With medical history of hypertension, hyperlipidemia, OSA, COPD, asthma, CHF presenting to the emergency department via EMS due to shortness of breath/asthma exacerbation.  Patient was given 2 DuoNebs and 125 Solu-Medrol in route by EMS.  States she was previously on 40 mg prednisone daily but taken off in 3 days ago.  She started feeling short of breath 2 days ago, ran out of her inhaler yesterday.  She endorses wheezing, nasal congestion, nonproductive cough.  Denies any chest pain with me.  No lower extremity swelling.  Home Medications Prior to Admission medications   Medication Sig Start Date End Date Taking? Authorizing Provider  albuterol (PROAIR HFA) 108 (90 Base) MCG/ACT inhaler TAKE 2 PUFFS BY MOUTH EVERY 6 HOURS AS NEEDED FOR WHEEZE OR SHORTNESS OF BREATH Patient taking differently: Inhale 2 puffs into the lungs every 6 (six) hours as needed for shortness of breath. 07/22/22   Chesley Mires, MD  albuterol (PROVENTIL) (2.5 MG/3ML) 0.083% nebulizer solution Take 3 mLs (2.5 mg total) by nebulization every 4 (four) hours as needed for wheezing or shortness of breath. 02/10/22   Cobb, Karie Schwalbe, NP  aspirin 81 MG EC tablet Take 1 tablet (81 mg total) by mouth daily. 02/07/19   [name], MD  Blood Glucose Monitoring Suppl (ACCU-CHEK GUIDE ME) w/Device KIT Use to check blood sugar once daily. 05/04/21   [name], MD  Budeson-Glycopyrrol-Formoterol (BREZTRI AEROSPHERE) 160-9-4.8 MCG/ACT AERO Inhale 2 puffs into the lungs in the morning and at bedtime. 02/10/22   Cobb, Karie Schwalbe, NP  cloNIDine (CATAPRES) 0.2 MG tablet Take 1 tablet (0.2 mg total) by mouth 2 (two) times daily. For BP and hot flashes 01/18/22   Charlynne Cousins, MD  fluticasone [facility]) 50 MCG/ACT nasal spray Place 1 spray into both nostrils daily as needed for allergies. 02/10/22   Cobb, Karie Schwalbe, NP  gabapentin (NEURONTIN) 300 MG capsule TAKE 1 CAPSULE BY MOUTH TWICE A DAY 05/10/22   [name], MD  glucose blood (ACCU-CHEK GUIDE) test strip Use as instructed daily before breakfast. 10/05/21   [name], MD  ibuprofen (ADVIL) 800 MG tablet Take 800 mg by mouth every 8 (eight) hours as needed (for pain).    [provider]  loratadine (CLARITIN) 10 MG tablet Take 1 tablet (10 mg total) by mouth daily. 10/02/21   Mick Sell, PA-C  metFORMIN (GLUCOPHAGE) 500 MG tablet TAKE 1 TABLET BY MOUTH 2 TIMES DAILY WITH A MEAL. Patient taking differently: Take 500 mg by mouth 2 (two) times daily with a meal. 02/09/22   Newlin, Charlane Ferretti, MD  montelukast (SINGULAIR) 10 MG tablet Take 1 tablet (10 mg total) by mouth at bedtime. 02/10/22   Cobb, Karie Schwalbe, NP  pantoprazole (PROTONIX) 40 MG tablet Take 1 tablet (40 mg total) by mouth daily. 02/10/22   Cobb, Karie Schwalbe, NP      Allergies    Tomato, Latex, and Wool alcohol [lanolin]    Review of Systems   Review of Systems  Respiratory:  Positive for shortness of breath.     Physical Exam Updated Vital Signs BP (!) 150/87   Pulse (!) 102   Temp 98 F (36.7 C)  Resp 20   SpO2 94%  Physical Exam Vitals and nursing note reviewed. Exam conducted with a chaperone present.  Constitutional:      Appearance: Normal appearance.  HENT:     Head: Normocephalic and atraumatic.  Eyes:     General: No scleral icterus.       Right eye: No discharge.        Left eye: No discharge.     Extraocular Movements: Extraocular movements intact.     Pupils: Pupils are equal, round, and reactive to light.  Neck:     Vascular: No JVD.  Cardiovascular:     Rate and Rhythm: Normal rate and regular rhythm.     Pulses: Normal pulses.     Heart sounds: Normal heart sounds. No murmur heard.    No  friction rub. No gallop.  Pulmonary:     Effort: Pulmonary effort is normal. Tachypnea present. No respiratory distress.     Breath sounds: Examination of the right-upper field reveals wheezing. Examination of the left-upper field reveals wheezing. Examination of the right-middle field reveals wheezing. Examination of the left-middle field reveals wheezing. Examination of the right-lower field reveals wheezing. Examination of the left-lower field reveals wheezing. Decreased breath sounds and wheezing present.     Comments: Speaking complete sentences Abdominal:     General: Abdomen is flat. Bowel sounds are normal. There is no distension.     Palpations: Abdomen is soft.     Tenderness: There is no abdominal tenderness.  Skin:    General: Skin is warm and dry.     Coloration: Skin is not jaundiced.  Neurological:     Mental Status: She is alert. Mental status is at baseline.     Coordination: Coordination normal.     ED Results / Procedures / Treatments   Labs (all labs ordered are listed, but only abnormal results are displayed) Labs Reviewed  CBC WITH DIFFERENTIAL/PLATELET - Abnormal; Notable for the following components:      Result Value   RDW 17.9 (*)    Lymphs Abs 4.4 (*)    All other components within normal limits  RESP PANEL BY RT-PCR (FLU A&B, COVID) [id number]  BASIC METABOLIC PANEL    EKG EKG Interpretation  Date/Time:  Saturday August 27 2022 14:54:42 EDT Ventricular Rate:  99 PR Interval:  117 QRS Duration: 87 QT Interval:  321 QTC Calculation: 412 R Axis:   80 Text Interpretation: Sinus rhythm Biatrial enlargement Probable LVH with secondary repol abnrm Confirmed by Dene Gentry [phone]) on 08/27/2022 3:03:05 PM  Radiology DG Chest Port 1 View  Result Date: 08/27/2022 CLINICAL DATA:  Wheezing and shortness of breath. History of asthma. EXAM: PORTABLE CHEST 1 VIEW COMPARISON:  None Available. FINDINGS: Heart size near the upper limit of normal. Clear lungs.  Mild peribronchial thickening. Unremarkable bones. IMPRESSION: Mild bronchitic changes. Electronically Signed   By: Claudie Revering M.D.   On: 08/27/2022 16:06    Procedures Procedures    Medications Ordered in ED Medications  albuterol (VENTOLIN HFA) 108 (90 Base) MCG/ACT inhaler 2 puff (has no administration in time range)  magnesium sulfate IVPB 2 g 50 mL (2 g Intravenous New Bag/Given 08/27/22 1529)  ipratropium-albuterol (DUONEB) 0.5-2.5 (3) MG/3ML nebulizer solution 3 mL (3 mLs Nebulization Given 08/27/22 1525)    ED Course/ Medical Decision Making/ A&P Clinical Course as of 08/27/22 1617  Sat Aug 27, 2022  1606 I reevaled the patient is still feeling tachypneic.  She is still wheezing diffusely  on exam even though she had 3 DuoNebs and mag.  I considered time the patient home but she still feels very ill, also she is been on steroids for 10 days and just cough, continues ago and is worse.  I think is reasonable to bring him in for admission for asthma exacerbation.  I will consult the hospitalist. [HS]    Clinical Course User Index [HS] Sherrill Raring, PA-C                           Medical Decision Making Amount and/or Complexity of Data Reviewed Independent Historian: EMS    Details: See HPI External Data Reviewed: notes.    Details: Followed by pulmonology, history of frequent exacerbations. Labs: ordered. Radiology: ordered.  Risk Prescription drug management. Decision regarding hospitalization.   Patient presents due to shortness of breath.  Differential includes but limited to asthma exacerbation, pneumonia, COVID, CHF exacerbation although less likely.  Considered PE but not hypoxic not having chest pains I also think less likely.  On exam patient speaking in complete sentences, diffuse wheezing and slight tachypnea.  Regular rhythm and pulses, no lower extremity swelling.  Afebrile.  I reviewed patient's medications.  Given patient already had steroids ordered DuoNeb  and 2 of magnesium.  Considered BiPAP but will hold off at this time.  I ordered, viewed and interpreted laboratory work-up. COVID and flu negative. CBC without leukocytosis or underlying anemia. BMP is pending.  I ordered, viewed and interpreted imaging studies.  Negative for pneumonia with settings consistent with bronchitis.  Agree with radiologist interpretation.  EKG shows sinus tachycardia, patient is mildly tachycardic with a rate of 102 in the room on the cardiac monitor per my interpretation.  Patient still is tachypneic and short of breath on reevaluation with wheezing I think she would benefit from admission.  I consult the hospitalist who agrees with admission.        Final Clinical Impression(s) / ED Diagnoses Final diagnoses:  Moderate persistent asthma with acute exacerbation    Rx / DC Orders ED Discharge Orders     None         Sherrill Raring, [place] 08/27/22 1618    Valarie Merino, MD 08/28/22 1300

## 2022-08-28 ENCOUNTER — Observation Stay (HOSPITAL_COMMUNITY): Payer: Medicaid Other

## 2022-08-28 DIAGNOSIS — E876 Hypokalemia: Secondary | ICD-10-CM

## 2022-08-28 DIAGNOSIS — F4320 Adjustment disorder, unspecified: Secondary | ICD-10-CM | POA: Diagnosis present

## 2022-08-28 DIAGNOSIS — Z825 Family history of asthma and other chronic lower respiratory diseases: Secondary | ICD-10-CM | POA: Diagnosis not present

## 2022-08-28 DIAGNOSIS — Z96652 Presence of left artificial knee joint: Secondary | ICD-10-CM | POA: Diagnosis present

## 2022-08-28 DIAGNOSIS — J209 Acute bronchitis, unspecified: Secondary | ICD-10-CM | POA: Diagnosis present

## 2022-08-28 DIAGNOSIS — F17210 Nicotine dependence, cigarettes, uncomplicated: Secondary | ICD-10-CM | POA: Diagnosis present

## 2022-08-28 DIAGNOSIS — R0609 Other forms of dyspnea: Secondary | ICD-10-CM

## 2022-08-28 DIAGNOSIS — Z8249 Family history of ischemic heart disease and other diseases of the circulatory system: Secondary | ICD-10-CM | POA: Diagnosis not present

## 2022-08-28 DIAGNOSIS — Z91048 Other nonmedicinal substance allergy status: Secondary | ICD-10-CM | POA: Diagnosis not present

## 2022-08-28 DIAGNOSIS — J4551 Severe persistent asthma with (acute) exacerbation: Secondary | ICD-10-CM | POA: Diagnosis present

## 2022-08-28 DIAGNOSIS — Z20822 Contact with and (suspected) exposure to covid-19: Secondary | ICD-10-CM | POA: Diagnosis present

## 2022-08-28 DIAGNOSIS — Z720 Tobacco use: Secondary | ICD-10-CM | POA: Diagnosis not present

## 2022-08-28 DIAGNOSIS — Z79899 Other long term (current) drug therapy: Secondary | ICD-10-CM | POA: Diagnosis not present

## 2022-08-28 DIAGNOSIS — I10 Essential (primary) hypertension: Secondary | ICD-10-CM | POA: Diagnosis not present

## 2022-08-28 DIAGNOSIS — Z91018 Allergy to other foods: Secondary | ICD-10-CM | POA: Diagnosis not present

## 2022-08-28 DIAGNOSIS — Z7951 Long term (current) use of inhaled steroids: Secondary | ICD-10-CM | POA: Diagnosis not present

## 2022-08-28 DIAGNOSIS — E785 Hyperlipidemia, unspecified: Secondary | ICD-10-CM | POA: Diagnosis present

## 2022-08-28 DIAGNOSIS — Z7982 Long term (current) use of aspirin: Secondary | ICD-10-CM | POA: Diagnosis not present

## 2022-08-28 DIAGNOSIS — Z23 Encounter for immunization: Secondary | ICD-10-CM | POA: Diagnosis not present

## 2022-08-28 DIAGNOSIS — R0602 Shortness of breath: Secondary | ICD-10-CM | POA: Diagnosis not present

## 2022-08-28 DIAGNOSIS — E1165 Type 2 diabetes mellitus with hyperglycemia: Secondary | ICD-10-CM | POA: Diagnosis present

## 2022-08-28 DIAGNOSIS — J4541 Moderate persistent asthma with (acute) exacerbation: Secondary | ICD-10-CM | POA: Diagnosis not present

## 2022-08-28 DIAGNOSIS — R062 Wheezing: Secondary | ICD-10-CM | POA: Diagnosis not present

## 2022-08-28 DIAGNOSIS — K219 Gastro-esophageal reflux disease without esophagitis: Secondary | ICD-10-CM

## 2022-08-28 DIAGNOSIS — Z7984 Long term (current) use of oral hypoglycemic drugs: Secondary | ICD-10-CM | POA: Diagnosis not present

## 2022-08-28 DIAGNOSIS — J441 Chronic obstructive pulmonary disease with (acute) exacerbation: Secondary | ICD-10-CM | POA: Diagnosis not present

## 2022-08-28 DIAGNOSIS — Z91040 Latex allergy status: Secondary | ICD-10-CM | POA: Diagnosis not present

## 2022-08-28 DIAGNOSIS — G4733 Obstructive sleep apnea (adult) (pediatric): Secondary | ICD-10-CM | POA: Diagnosis not present

## 2022-08-28 LAB — GLUCOSE, CAPILLARY
Glucose-Capillary: 253 mg/dL — ABNORMAL HIGH (ref 70–99)
Glucose-Capillary: 219 mg/dL — ABNORMAL HIGH (ref 70–99)
Glucose-Capillary: 137 mg/dL — ABNORMAL HIGH (ref 70–99)

## 2022-08-28 LAB — CBC
HCT: 40 % (ref 36.0–46.0)
Hemoglobin: 12.8 g/dL (ref 12.0–15.0)
MCH: 28.1 pg (ref 26.0–34.0)
MCHC: 32 g/dL (ref 30.0–36.0)
MCV: 87.7 fL (ref 80.0–100.0)
Platelets: 282 10*3/uL (ref 150–400)
RBC: 4.56 MIL/uL (ref 3.87–5.11)
RDW: 17.9 % — ABNORMAL HIGH (ref 11.5–15.5)
WBC: 8 10*3/uL (ref 4.0–10.5)
nRBC: 0 % (ref 0.0–0.2)

## 2022-08-28 LAB — ECHOCARDIOGRAM COMPLETE
AR max vel: 1.98 cm2
AV Area VTI: 2.22 cm2
AV Area mean vel: 1.77 cm2
AV Mean grad: 5 mmHg
AV Peak grad: 10.5 mmHg
Ao pk vel: 1.62 m/s
Area-P 1/2: 3.17 cm2
Calc EF: 55.7 %
Height: 65 in
MV M vel: 3.03 m/s
MV Peak grad: 36.7 mmHg
S' Lateral: 3.5 cm
Single Plane A2C EF: 57.7 %
Single Plane A4C EF: 52.3 %
Weight: 3161.6 oz

## 2022-08-28 LAB — COMPREHENSIVE METABOLIC PANEL
ALT: 16 U/L (ref 0–44)
AST: 13 U/L — ABNORMAL LOW (ref 15–41)
Albumin: 3.2 g/dL — ABNORMAL LOW (ref 3.5–5.0)
Alkaline Phosphatase: 46 U/L (ref 38–126)
Anion gap: 3 — ABNORMAL LOW (ref 5–15)
BUN: 18 mg/dL (ref 6–20)
CO2: 27 mmol/L (ref 22–32)
Calcium: 8.5 mg/dL — ABNORMAL LOW (ref 8.9–10.3)
Chloride: 106 mmol/L (ref 98–111)
Creatinine, Ser: 0.89 mg/dL (ref 0.44–1.00)
GFR, Estimated: >60 mL/min (ref >60)
Glucose, Bld: 113 mg/dL — ABNORMAL HIGH (ref 70–99)
Potassium: 4.6 mmol/L (ref 3.5–5.1)
Sodium: 136 mmol/L (ref 135–145)
Total Bilirubin: 0.4 mg/dL (ref 0.3–1.2)
Total Protein: 6.2 g/dL — ABNORMAL LOW (ref 6.5–8.1)

## 2022-08-28 LAB — PROCALCITONIN: Procalcitonin: <0.1 ng/mL

## 2022-08-28 LAB — HIV ANTIBODY (ROUTINE TESTING W REFLEX): HIV Screen 4th Generation wRfx: NONREACTIVE

## 2022-08-28 MED ORDER — SALINE SPRAY 0.65 % NA SOLN
1.0000 | NASAL | Status: DC | PRN
  Administered 2022-08-28: 1 via NASAL
  Filled 2022-08-28: qty 44

## 2022-08-28 MED ORDER — DOXYCYCLINE HYCLATE 100 MG PO TABS
100.0000 mg | ORAL_TABLET | Freq: Two times a day (BID) | ORAL | Status: DC
  Administered 2022-08-28 – 2022-08-30 (×4): 100 mg via ORAL
  Filled 2022-08-28 (×4): qty 1

## 2022-08-28 MED ORDER — BENZONATATE 100 MG PO CAPS
200.0000 mg | ORAL_CAPSULE | Freq: Three times a day (TID) | ORAL | Status: DC | PRN
  Administered 2022-08-28 – 2022-08-29 (×2): 200 mg via ORAL
  Filled 2022-08-28 (×2): qty 2

## 2022-08-28 MED ORDER — FLUTICASONE PROPIONATE 50 MCG/ACT NA SUSP
1.0000 | Freq: Every day | NASAL | Status: DC
  Administered 2022-08-28 – 2022-08-30 (×3): 1 via NASAL
  Filled 2022-08-28: qty 16

## 2022-08-28 MED ORDER — BUDESON-GLYCOPYRROL-FORMOTEROL 160-9-4.8 MCG/ACT IN AERO: 2.0000 | INHALATION_SPRAY | Freq: Two times a day (BID) | RESPIRATORY_TRACT | Status: DC

## 2022-08-28 MED ORDER — UMECLIDINIUM BROMIDE 62.5 MCG/ACT IN AEPB
1.0000 | INHALATION_SPRAY | Freq: Every day | RESPIRATORY_TRACT | Status: DC
  Administered 2022-08-29 – 2022-08-30 (×2): 1 via RESPIRATORY_TRACT
  Filled 2022-08-28: qty 7

## 2022-08-28 MED ORDER — METHYLPREDNISOLONE SODIUM SUCC 40 MG IJ SOLR
40.0000 mg | Freq: Two times a day (BID) | INTRAMUSCULAR | Status: DC
  Administered 2022-08-28 – 2022-08-30 (×5): 40 mg via INTRAVENOUS
  Filled 2022-08-28 (×5): qty 1

## 2022-08-28 MED ORDER — INFLUENZA VAC SPLIT QUAD 0.5 ML IM SUSY
0.5000 mL | PREFILLED_SYRINGE | INTRAMUSCULAR | Status: AC
  Administered 2022-08-28: 0.5 mL via INTRAMUSCULAR
  Filled 2022-08-28: qty 0.5

## 2022-08-28 MED ORDER — INSULIN ASPART 100 UNIT/ML IJ SOLN
4.0000 [IU] | Freq: Three times a day (TID) | INTRAMUSCULAR | Status: DC
  Administered 2022-08-30 (×2): 4 [IU] via SUBCUTANEOUS

## 2022-08-28 MED ORDER — FLUTICASONE FUROATE-VILANTEROL 200-25 MCG/ACT IN AEPB
1.0000 | INHALATION_SPRAY | Freq: Every day | RESPIRATORY_TRACT | Status: DC
  Administered 2022-08-29 – 2022-08-30 (×2): 1 via RESPIRATORY_TRACT
  Filled 2022-08-28: qty 28

## 2022-08-28 MED ORDER — ALBUTEROL SULFATE (2.5 MG/3ML) 0.083% IN NEBU: 2.5000 mg | INHALATION_SOLUTION | Freq: Four times a day (QID) | RESPIRATORY_TRACT | Status: DC | PRN

## 2022-08-28 MED ORDER — GUAIFENESIN-DM 100-10 MG/5ML PO SYRP
5.0000 mL | ORAL_SOLUTION | ORAL | Status: DC | PRN
  Administered 2022-08-28 – 2022-08-29 (×2): 5 mL via ORAL
  Filled 2022-08-28 (×2): qty 10

## 2022-08-28 NOTE — Evaluation (Signed)
Physical Therapy Evaluation Patient Details Name: Kathleen Rodriguez MRN: [id number] DOB: January 14, 1966 Today's Date: 08/28/2022  History of Present Illness  Pt admitted from home 2* SOB with COPD and asthma exacerbation.  Pt with hx ofasthma, COPD, CHF, DM, L TKR, critical illness myopathy, and cocaine abuse  Clinical Impression  Pt admitted as above and currently demonstrating MOD IND to IND with basic mobility tasks.  Pt up to ambulate in halls 300' with mild SOB noted but SaO2 maintained 93%+.  Pt states she feels she is at or near her baseline.  Pt hopeful for dc home tomorrow.  No further PT needs noted - pt will be deferred to mobility team and PT service will sign off.     Recommendations for follow up therapy are one component of a multi-disciplinary discharge planning process, led by the attending physician.  Recommendations may be updated based on patient status, additional functional criteria and insurance authorization.  Follow Up Recommendations No PT follow up      Assistance Recommended at Discharge PRN  Patient can return home with the following  Help with stairs or ramp for entrance;Assistance with cooking/housework    Equipment Recommendations None recommended by PT  Recommendations for Other Services       Functional Status Assessment Patient has not had a recent decline in their functional status     Precautions / Restrictions Precautions Precautions: Fall Restrictions Weight Bearing Restrictions: No      Mobility  Bed Mobility Overal bed mobility: Modified Independent                  Transfers Overall transfer level: Modified independent                      Ambulation/Gait Ambulation/Gait assistance: Supervision Gait Distance (Feet): 300 Feet Assistive device: None Gait Pattern/deviations: Step-through pattern, Decreased step length - right, Decreased step length - left, Shuffle Gait velocity: decr     General Gait Details:  occasional reaching for rail but min instability noted and no LOB  Stairs            Wheelchair Mobility    Modified Rankin (Stroke Patients Only)       Balance Overall balance assessment: Needs assistance Sitting-balance support: No upper extremity supported, Feet supported Sitting balance-Leahy Scale: Normal     Standing balance support: No upper extremity supported Standing balance-Leahy Scale: Good                               Pertinent Vitals/Pain Pain Assessment Pain Assessment: No/denies pain    Home Living Family/patient expects to be discharged to:: Private residence Living Arrangements: Spouse/significant other;Children Available Help at Discharge: Family;Available 24 hours/day Type of Home: Apartment Home Access: Stairs to enter Entrance Stairs-Rails: Can reach both;Right;Left Entrance Stairs-Number of Steps: 3rd floor   Home Layout: One level Home Equipment: Rollator (4 wheels)      Prior Function Prior Level of Function : Independent/Modified Independent             Mobility Comments: uses rollator outside "in case I need to sit"       Hand Dominance   Dominant Hand: Right    Extremity/Trunk Assessment   Upper Extremity Assessment Upper Extremity Assessment: Overall WFL for tasks assessed    Lower Extremity Assessment Lower Extremity Assessment: Overall WFL for tasks assessed    Cervical / Trunk Assessment Cervical / Trunk  Assessment: Normal  Communication   Communication: No difficulties  Cognition Arousal/Alertness: Awake/alert Behavior During Therapy: WFL for tasks assessed/performed Overall Cognitive Status: Within Functional Limits for tasks assessed                                          General Comments      Exercises     Assessment/Plan    PT Assessment Patient does not need any further PT services  PT Problem List         PT Treatment Interventions      PT Goals (Current  goals can be found in the Care Plan section)  Acute Rehab PT Goals PT Goal Formulation: All assessment and education complete, DC therapy    Frequency       Co-evaluation               AM-PAC PT "6 Clicks" Mobility  Outcome Measure Help needed turning from your back to your side while in a flat bed without using bedrails?: None Help needed moving from lying on your back to sitting on the side of a flat bed without using bedrails?: None Help needed moving to and from a bed to a chair (including a wheelchair)?: None Help needed standing up from a chair using your arms (e.g., wheelchair or bedside chair)?: None Help needed to walk in hospital room?: None Help needed climbing 3-5 steps with a railing? : A Little 6 Click Score: 23    End of Session Equipment Utilized During Treatment: Gait belt Activity Tolerance: Patient tolerated treatment well Patient left: in chair;with call bell/phone within reach Nurse Communication: Mobility status PT Visit Diagnosis: Difficulty in walking, not elsewhere classified (R26.2)    Time: 1350-1407 PT Time Calculation (min) (ACUTE ONLY): 17 min   Charges:   PT Evaluation $PT Eval Low Complexity: [place] Pager [phone] Office [phone]   Haliegh Khurana 08/28/2022, 2:29 PM

## 2022-08-28 NOTE — Plan of Care (Signed)
  Problem: Activity: Goal: Risk for activity intolerance will decrease Outcome: Progressing   Problem: Nutrition: Goal: Adequate nutrition will be maintained Outcome: Progressing   Problem: Elimination: Goal: Will not experience complications related to bowel motility Outcome: Progressing Goal: Will not experience complications related to urinary retention Outcome: Progressing   Problem: Fluid Volume: Goal: Ability to maintain a balanced intake and output will improve Outcome: Progressing   Problem: Tissue Perfusion: Goal: Adequacy of tissue perfusion will improve Outcome: Progressing

## 2022-08-28 NOTE — Progress Notes (Signed)
  Echocardiogram 2D Echocardiogram has been performed.  Kathleen Rodriguez 08/28/2022, 10:39 AM

## 2022-08-28 NOTE — Progress Notes (Signed)
[facility]                                                                               Kathleen Rodriguez, is a 56 y.o. female, DOB - 10-08-1966, ZOX:[id number] Admit date - 08/27/2022    Outpatient Primary MD for the patient is [name], MD  LOS - 0  days    Brief summary    Kathleen Rodriguez is a 56 y.o. female with medical history significant of osteoarthritis, asthma, COPD, intubated once in 8119, diastolic dysfunction, history of cocaine abuse, critical illness myopathy, type 2 diabetes, GERD hypertension, influenza B, OSA on CPAP, history of pneumonia, former tobacco use until recently who is brought to the emergency department due to progressively worse dyspnea, fatigue and wheezing for the past 3 days after she got off of prednisone that was given to her for an early attack  She has smoked some cigarettes as she has been mourning the death of her son earlier this year.  She also is off montelukast, loratadine and PPI.    Assessment & Plan    Assessment and Plan:   Acute copd exacerbation:  Not well controlled.  Restart her on IV solumedrol 40 mg 12 HOURS.  Continue with duonebs, home inhalers and cough syrup.  Add doxycycline for acute bronchitis.     Type 2 DM; uncontrolled with hyperglycemia  CBG (last 3)  Recent Labs    08/27/22 2055 08/28/22 0731 08/28/22 1634  GLUCAP 220* 253* 219*   Resume SSI.  Add novolog 4 units TIDAC.  Elevated cbgs' sec to steroids.     OSA on CPAP.   Hypertension Well controlled    GERD on PPI.     Hypokalemia Replaced.   Estimated body mass index is 32.88 kg/m as calculated from the following:   Height as of this encounter: 5\' 5"  (1.651 m).   Weight as of this encounter: 89.6 kg.  Code Status: full code.  DVT Prophylaxis:  enoxaparin (LOVENOX) injection 40 mg Start: 08/27/22 2200   Level of Care: Level of care: Med-Surg Family Communication: none at bedside.   Disposition Plan:     Remains  inpatient appropriate:  IV steroids.   Procedures:  echo  Consultants:   None.   Antimicrobials:   Anti-infectives (From admission, onward)    Start     Dose/Rate Route Frequency Ordered Stop   08/28/22 1800  doxycycline (VIBRA-TABS) tablet 100 mg        100 mg Oral Every 12 hours 08/28/22 1521     08/27/22 2200  fluconazole (DIFLUCAN) tablet 150 mg        150 mg Oral  Once 08/27/22 1855 08/27/22 2049        Medications  Scheduled Meds:  aspirin EC  81 mg Oral Daily   busPIRone  10 mg Oral BID   cloNIDine  0.2 mg Oral BID   doxycycline  100 mg Oral Q12H   enoxaparin (LOVENOX) injection  40 mg Subcutaneous Q24H   fluticasone  1 spray Each Nare Daily   fluticasone furoate-vilanterol  1 puff Inhalation Daily   And   umeclidinium bromide  1 puff Inhalation Daily  insulin aspart  0-15 Units Subcutaneous TID WC   ipratropium-albuterol  3 mL Nebulization QID   loratadine  10 mg Oral Daily   metFORMIN  500 mg Oral BID WC   methylPREDNISolone (SOLU-MEDROL) injection  40 mg Intravenous Q12H   montelukast  10 mg Oral QHS   pantoprazole  40 mg Oral Daily   Continuous Infusions: PRN Meds:.acetaminophen **OR** acetaminophen, albuterol, benzonatate, gabapentin, guaiFENesin-dextromethorphan, ondansetron **OR** ondansetron (ZOFRAN) IV, sodium chloride    Subjective:   Eshal Propps was seen and examined today.  Still coughing.   Objective:   Vitals:   08/28/22 0208 08/28/22 0544 08/28/22 0936 08/28/22 1324  BP: 123/84 (!) 135/95 136/89 97/68  Pulse: 82 73  82  Resp: 17 16  18   Temp: 98 F (36.7 C) 98.2 F (36.8 C)  98.4 F (36.9 C)  TempSrc:  Oral  Oral  SpO2: 94% 94%  97%  Weight:      Height:        Intake/Output Summary (Last 24 hours) at 08/28/2022 1742 Last data filed at 08/28/2022 1326 Gross per 24 hour  Intake 600 ml  Output --  Net 600 ml   Filed Weights   08/27/22 2023  Weight: 89.6 kg     Exam General: Alert and oriented x 3,  NAD Cardiovascular: S1 S2 auscultated, no murmurs, RRR Respiratory: scattered wheezing bilateral.  Gastrointestinal: Soft, nontender, nondistended, + bowel sounds Ext: no pedal edema bilaterally Neuro: AAOx3, Cr N's II- XII. Strength 5/5 upper and lower extremities bilaterally Skin: No rashes Psych: Normal affect and demeanor, alert and oriented x3    Data Reviewed:  I have personally reviewed following labs and imaging studies   CBC Lab Results  Component Value Date   WBC 8.0 08/28/2022   RBC 4.56 08/28/2022   HGB 12.8 08/28/2022   HCT 40.0 08/28/2022   MCV 87.7 08/28/2022   MCH 28.1 08/28/2022   PLT 282 08/28/2022   MCHC 32.0 08/28/2022   RDW 17.9 (H) 08/28/2022   LYMPHSABS 4.4 (H) 08/27/2022   MONOABS 0.7 08/27/2022   EOSABS 0.2 08/27/2022   BASOSABS 0.1 08/27/2022     Last metabolic panel Lab Results  Component Value Date   NA 136 08/28/2022   K 4.6 08/28/2022   CL 106 08/28/2022   CO2 27 08/28/2022   BUN 18 08/28/2022   CREATININE 0.89 08/28/2022   GLUCOSE 113 (H) 08/28/2022   GFRNONAA >60 08/28/2022   GFRAA 84 12/02/2020   CALCIUM 8.5 (L) 08/28/2022   PHOS 3.4 08/27/2022   PROT 6.2 (L) 08/28/2022   ALBUMIN 3.2 (L) 08/28/2022   LABGLOB 3.0 12/02/2020   AGRATIO 1.4 12/02/2020   BILITOT 0.4 08/28/2022   ALKPHOS 46 08/28/2022   AST 13 (L) 08/28/2022   ALT 16 08/28/2022   ANIONGAP 3 (L) 08/28/2022    CBG (last 3)  Recent Labs    08/27/22 2055 08/28/22 0731 08/28/22 1634  GLUCAP 220* 253* 219*      Coagulation Profile: No results for input(s): "INR", "PROTIME" in the last 168 hours.   Radiology Studies: ECHOCARDIOGRAM COMPLETE  Result Date: 08/28/2022    ECHOCARDIOGRAM REPORT   Patient Name:   Kathleen Rodriguez Date of Exam: 08/28/2022 Medical Rec #:  13/02/2022      Height:       65.0 in Accession #:    [id number]     Weight:       197.6 lb Date of Birth:  07/20/1966  BSA:          1.968 m Patient Age:    56 years       BP:           136/89  mmHg Patient Gender: F              HR:           73 bpm. Exam Location:  Inpatient Procedure: 2D Echo Indications:    Dyspnea; Grade I Diastology  History:        Patient has prior history of Echocardiogram examinations, most                 recent 12/25/2018. COPD; Risk Factors:Current Smoker and                 Hypertension.  Sonographer:    Cathie HoopsWendy Porter Referring Phys: [id number] DAVID MANUEL ORTIZ IMPRESSIONS  1. Left ventricular ejection fraction, by estimation, is 55 to 60%. The left ventricle has normal function. The left ventricle has no regional wall motion abnormalities. There is mild left ventricular hypertrophy. Left ventricular diastolic parameters were normal.  2. Right ventricular systolic function is normal. The right ventricular size is normal. There is mildly elevated pulmonary artery systolic pressure. The estimated right ventricular systolic pressure is 38.7 mmHg.  3. The mitral valve is normal in structure. Trivial mitral valve regurgitation. No evidence of mitral stenosis.  4. The aortic valve is tricuspid. Aortic valve regurgitation is not visualized. No aortic stenosis is present.  5. The inferior vena cava is normal in size with greater than 50% respiratory variability, suggesting right atrial pressure of 3 mmHg. FINDINGS  Left Ventricle: Left ventricular ejection fraction, by estimation, is 55 to 60%. The left ventricle has normal function. The left ventricle has no regional wall motion abnormalities. The left ventricular internal cavity size was normal in size. There is  mild left ventricular hypertrophy. Left ventricular diastolic parameters were normal. Right Ventricle: The right ventricular size is normal. No increase in right ventricular wall thickness. Right ventricular systolic function is normal. There is mildly elevated pulmonary artery systolic pressure. The tricuspid regurgitant velocity is 2.77  m/s, and with an assumed right atrial pressure of 8 mmHg, the estimated right  ventricular systolic pressure is 38.7 mmHg. Left Atrium: Left atrial size was normal in size. Right Atrium: Right atrial size was normal in size. Pericardium: Trivial pericardial effusion is present. Mitral Valve: The mitral valve is normal in structure. Trivial mitral valve regurgitation. No evidence of mitral valve stenosis. Tricuspid Valve: The tricuspid valve is normal in structure. Tricuspid valve regurgitation is trivial. No evidence of tricuspid stenosis. Aortic Valve: The aortic valve is tricuspid. Aortic valve regurgitation is not visualized. No aortic stenosis is present. Aortic valve mean gradient measures 5.0 mmHg. Aortic valve peak gradient measures 10.5 mmHg. Aortic valve area, by VTI measures 2.22  cm. Pulmonic Valve: The pulmonic valve was normal in structure. Pulmonic valve regurgitation is trivial. No evidence of pulmonic stenosis. Aorta: The aortic root is normal in size and structure. Venous: The inferior vena cava is normal in size with greater than 50% respiratory variability, suggesting right atrial pressure of 3 mmHg. IAS/Shunts: No atrial level shunt detected by color flow Doppler.  LEFT VENTRICLE PLAX 2D LVIDd:         5.10 cm      Diastology LVIDs:         3.50 cm      LV e' medial:  7.62 cm/s LV PW:         1.00 cm      LV E/e' medial:  14.2 LV IVS:        0.90 cm      LV e' lateral:   7.83 cm/s LVOT diam:     2.00 cm      LV E/e' lateral: 13.8 LV SV:         58 LV SV Index:   30 LVOT Area:     3.14 cm  LV Volumes (MOD) LV vol d, MOD A2C: 119.0 ml LV vol d, MOD A4C: 106.0 ml LV vol s, MOD A2C: 50.3 ml LV vol s, MOD A4C: 50.6 ml LV SV MOD A2C:     68.7 ml LV SV MOD A4C:     106.0 ml LV SV MOD BP:      64.4 ml RIGHT VENTRICLE RV Basal diam:  3.90 cm RV Mid diam:    3.45 cm TAPSE (M-mode): 2.0 cm LEFT ATRIUM             Index        RIGHT ATRIUM           Index LA diam:        4.00 cm 2.03 cm/m   RA Area:     17.60 cm LA Vol (A2C):   79.2 ml 40.24 ml/m  RA Volume:   52.20 ml  26.52  ml/m LA Vol (A4C):   42.1 ml 21.39 ml/m LA Biplane Vol: 60.6 ml 30.79 ml/m  AORTIC VALVE                     PULMONIC VALVE AV Area (Vmax):    1.98 cm      PV Vmax:          1.06 m/s AV Area (Vmean):   1.77 cm      PV Peak grad:     4.5 mmHg AV Area (VTI):     2.22 cm      PR End Diast Vel: 1.99 msec AV Vmax:           162.00 cm/s AV Vmean:          107.000 cm/s AV VTI:            0.263 m AV Peak Grad:      10.5 mmHg AV Mean Grad:      5.0 mmHg LVOT Vmax:         102.00 cm/s LVOT Vmean:        60.300 cm/s LVOT VTI:          0.186 m LVOT/AV VTI ratio: 0.71  AORTA Ao Root diam: 3.50 cm Ao Asc diam:  3.20 cm MITRAL VALVE                TRICUSPID VALVE MV Area (PHT): 3.17 cm     TR Peak grad:   30.7 mmHg MV Decel Time: 239 msec     TR Vmax:        277.00 cm/s MR Peak grad: 36.7 mmHg MR Vmax:      303.00 cm/s   SHUNTS MV E velocity: 108.00 cm/s  Systemic VTI:  0.19 m MV A velocity: 61.30 cm/s   Systemic Diam: 2.00 cm MV E/A ratio:  1.76 Weston Brass MD Electronically signed by Weston Brass MD Signature Date/Time: 08/28/2022/12:07:18 PM    Final    DG Chest Port 1 View  Result Date:  08/27/2022 CLINICAL DATA:  Wheezing and shortness of breath. History of asthma. EXAM: PORTABLE CHEST 1 VIEW COMPARISON:  None Available. FINDINGS: Heart size near the upper limit of normal. Clear lungs. Mild peribronchial thickening. Unremarkable bones. IMPRESSION: Mild bronchitic changes. Electronically Signed   By: Beckie Salts M.D.   On: 08/27/2022 16:06       Kathlen Mody M.D. [facility] 08/28/2022, 5:42 PM  Available via Epic secure chat 7am-7pm After 7 pm, please refer to night coverage provider listed on amion.

## 2022-08-29 DIAGNOSIS — G4733 Obstructive sleep apnea (adult) (pediatric): Secondary | ICD-10-CM | POA: Diagnosis not present

## 2022-08-29 DIAGNOSIS — J441 Chronic obstructive pulmonary disease with (acute) exacerbation: Secondary | ICD-10-CM | POA: Diagnosis not present

## 2022-08-29 DIAGNOSIS — I10 Essential (primary) hypertension: Secondary | ICD-10-CM | POA: Diagnosis not present

## 2022-08-29 DIAGNOSIS — K219 Gastro-esophageal reflux disease without esophagitis: Secondary | ICD-10-CM | POA: Diagnosis not present

## 2022-08-29 LAB — GLUCOSE, CAPILLARY
Glucose-Capillary: 123 mg/dL — ABNORMAL HIGH (ref 70–99)
Glucose-Capillary: 116 mg/dL — ABNORMAL HIGH (ref 70–99)
Glucose-Capillary: 84 mg/dL (ref 70–99)
Glucose-Capillary: 154 mg/dL — ABNORMAL HIGH (ref 70–99)
Glucose-Capillary: 125 mg/dL — ABNORMAL HIGH (ref 70–99)

## 2022-08-29 MED ORDER — SENNOSIDES-DOCUSATE SODIUM 8.6-50 MG PO TABS
2.0000 | ORAL_TABLET | Freq: Two times a day (BID) | ORAL | Status: DC
  Administered 2022-08-29 (×2): 2 via ORAL
  Filled 2022-08-29 (×2): qty 2

## 2022-08-29 MED ORDER — PHENOL 1.4 % MT LIQD: 1.0000 | OROMUCOSAL | Status: DC | PRN

## 2022-08-29 MED ORDER — MENTHOL 3 MG MT LOZG: 1.0000 | LOZENGE | OROMUCOSAL | Status: DC | PRN

## 2022-08-29 MED ORDER — POLYETHYLENE GLYCOL 3350 17 G PO PACK
17.0000 g | PACK | Freq: Every day | ORAL | Status: DC
  Filled 2022-08-29: qty 1

## 2022-08-29 MED ORDER — GABAPENTIN 300 MG PO CAPS
300.0000 mg | ORAL_CAPSULE | Freq: Three times a day (TID) | ORAL | Status: DC
  Administered 2022-08-29 – 2022-08-30 (×4): 300 mg via ORAL
  Filled 2022-08-29 (×4): qty 1

## 2022-08-29 NOTE — TOC Initial Note (Signed)
Transition of Care [facility]) - Initial/Assessment Note    Patient Details  Name: Kathleen Rodriguez MRN: [id number] Date of Birth: 01/29/1966  [facility]) CM/SW Contact:    Dessa Phi, RN Phone Number: 08/29/2022, 12:36 PM  Clinical Narrative:  Has health insurance,has pcp,pharmacy,own transport home.                 Expected Discharge Plan: Home/Self Care Barriers to Discharge: Continued Medical Work up   Patient Goals and CMS Choice Patient states their goals for this hospitalization and ongoing recovery are::  (Home) CMS Medicare.gov Compare Post Acute Care list provided to:: Patient Choice offered to / list presented to : Patient  Expected Discharge Plan and Services Expected Discharge Plan: Home/Self Care   Discharge Planning Services: CM Consult Post Acute Care Choice: NA Living arrangements for the past 2 months: Apartment                                      Prior Living Arrangements/Services Living arrangements for the past 2 months: Apartment Lives with:: Spouse Patient language and need for interpreter reviewed:: Yes Do you feel safe going back to the place where you live?: Yes      Need for Family Participation in Patient Care: Yes (Comment) Care giver support system in place?: Yes (comment)   Criminal Activity/Legal Involvement Pertinent to Current Situation/Hospitalization: No - Comment as needed  Activities of Daily Living Home Assistive Devices/Equipment: Blood pressure cuff, CPAP, Walker (specify type) ADL Screening (condition at time of admission) Patient's cognitive ability adequate to safely complete daily activities?: No Is the patient deaf or have difficulty hearing?: No Does the patient have difficulty seeing, even when wearing glasses/contacts?: No Does the patient have difficulty concentrating, remembering, or making decisions?: No Patient able to express need for assistance with ADLs?: Yes Does the patient have difficulty  dressing or bathing?: No Independently performs ADLs?: Yes (appropriate for developmental age) Does the patient have difficulty walking or climbing stairs?: No Weakness of Legs: None Weakness of Arms/Hands: None  Permission Sought/Granted Permission sought to share information with : Case Manager Permission granted to share information with : Yes, Verbal Permission Granted  Share Information with NAME:  (Case manager)           Emotional Assessment              Admission diagnosis:  COPD with acute exacerbation ([place]) [J44.1] Moderate persistent asthma with acute exacerbation [J45.41] Patient Active Problem List   Diagnosis Date Noted   COPD with acute exacerbation ([place]) 08/27/2022   Grief at loss of child 08/27/2022   Itching in the vaginal area 08/27/2022   Glucose intolerance 08/27/2022   Seasonal allergic rhinitis 02/10/2022   Acute respiratory failure with hypoxia ([place]) 01/16/2022   Anxiety 10/17/2021   Asthma with COPD 10/16/2021   Hypocalcemia 10/16/2021   Left shoulder pain 10/16/2021   Severe persistent asthma with acute exacerbation 09/25/2021   Diabetes mellitus without complication ([place]) 82/70/7867   OSA (obstructive sleep apnea) 01/20/2021   Essential hypertension 01/20/2021   Obesity (BMI 30-39.9) 01/20/2021   Acute asthma exacerbation 10/09/2019   COPD mixed type ([place]) 07/04/2019   Asthma 10/22/2017   Hypokalemia 07/10/2017   Osteoarthritis of left knee 09/01/2016   Total knee replacement status 09/01/2016   Osteoarthritis of knees, 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   Tobacco abuse    Primary osteoarthritis of left knee  03/09/2016   Anemia, iron deficiency 08/12/2015   Dysfunctional uterine bleeding 12/28/2014   Morbid obesity (H[facility]) 05/16/2014   Upper airway cough syndrome 05/01/2014   GERD (gastroesophageal reflux disease) 04/08/2014   OSA (obstructive sleep apnea) 03/20/2013   PCP:  Hoy Register, MD Pharmacy:   [facility] [phone] - [place], [place] - [address] AT [facility] [address] [place] [address] Phone: [phone] Fax: [phone]     Social Determinants of Health (SDOH) Interventions    Readmission Risk Interventions     No data to display

## 2022-08-29 NOTE — Progress Notes (Signed)
[facility]                                                                               Kathleen Rodriguez, is a 56 y.o. female, DOB - 1966-05-13, KVQ:[id number] Admit date - 08/27/2022    Outpatient Primary MD for the patient is [name], MD  LOS - 1  days    Brief summary    Kathleen Rodriguez is a 56 y.o. female with medical history significant of osteoarthritis, asthma, COPD, intubated once in 6433, diastolic dysfunction, history of cocaine abuse, critical illness myopathy, type 2 diabetes, GERD hypertension, influenza B, OSA on CPAP, history of pneumonia, former tobacco use until recently who is brought to the emergency department due to progressively worse dyspnea, fatigue and wheezing for the past 3 days after she got off of prednisone that was given to her for an early attack  She has smoked some cigarettes as she has been mourning the death of her son earlier this year.   She was admitted for acute copd exacerbation with acute bronchitis.   Assessment & Plan    Assessment and Plan:   Acute copd exacerbation and acute bronchitis.  Improving, not resolved yet.  Currently on solumedrol 40 mg IV every 12 hours.  Continue with duonebs, home inhalers and cough syrup.  Added doxycycline for acute bronchitis.     Type 2 DM;  CBG (last 3)  Recent Labs    08/28/22 2031 08/29/22 0725 08/29/22 1128  GLUCAP 137* 116* 84    Resume SSI.  Decreased novolog to 3 units TIDAC. Get A1c.  Elevated cbgs' sec to steroids.     OSA on CPAP.   Hypertension BP are optimal.    GERD on PPI.     Hypokalemia Replaced.   Estimated body mass index is 32.88 kg/m as calculated from the following:   Height as of this encounter: 5\' 5"  (1.651 m).   Weight as of this encounter: 89.6 kg.  Code Status: full code.  DVT Prophylaxis:  enoxaparin (LOVENOX) injection 40 mg Start: 08/27/22 2200   Level of Care: Level of care: Med-Surg Family Communication: none at  bedside.   Disposition Plan:     Remains inpatient appropriate:  IV steroids.   Procedures:  echo  Consultants:   None.   Antimicrobials:   Anti-infectives (From admission, onward)    Start     Dose/Rate Route Frequency Ordered Stop   08/28/22 1800  doxycycline (VIBRA-TABS) tablet 100 mg        100 mg Oral Every 12 hours 08/28/22 1521     08/27/22 2200  fluconazole (DIFLUCAN) tablet 150 mg        150 mg Oral  Once 08/27/22 1855 08/27/22 2049        Medications  Scheduled Meds:  aspirin EC  81 mg Oral Daily   busPIRone  10 mg Oral BID   cloNIDine  0.2 mg Oral BID   doxycycline  100 mg Oral Q12H   enoxaparin (LOVENOX) injection  40 mg Subcutaneous Q24H   fluticasone  1 spray Each Nare Daily   fluticasone furoate-vilanterol  1 puff Inhalation Daily   And  umeclidinium bromide  1 puff Inhalation Daily   gabapentin  300 mg Oral TID   insulin aspart  0-15 Units Subcutaneous TID WC   insulin aspart  4 Units Subcutaneous TID WC   loratadine  10 mg Oral Daily   metFORMIN  500 mg Oral BID WC   methylPREDNISolone (SOLU-MEDROL) injection  40 mg Intravenous Q12H   montelukast  10 mg Oral QHS   pantoprazole  40 mg Oral Daily   polyethylene glycol  17 g Oral Daily   senna-docusate  2 tablet Oral BID   Continuous Infusions: PRN Meds:.acetaminophen **OR** acetaminophen, albuterol, benzonatate, guaiFENesin-dextromethorphan, menthol-cetylpyridinium, ondansetron **OR** ondansetron (ZOFRAN) IV, phenol, sodium chloride    Subjective:   Kathleen Rodriguez was seen and examined today.  Still coughing. Breathing has improved.   Objective:   Vitals:   08/28/22 2311 08/29/22 0410 08/29/22 0832 08/29/22 0836  BP: 124/82 130/74    Pulse: 73 75    Resp: 18     Temp: 97.9 F (36.6 C) 98.2 F (36.8 C)    TempSrc: Oral Oral    SpO2: 98% 100% 94% 94%  Weight:      Height:        Intake/Output Summary (Last 24 hours) at 08/29/2022 1213 Last data filed at 08/29/2022 0700 Gross per 24  hour  Intake 480 ml  Output --  Net 480 ml    Filed Weights   08/27/22 2023  Weight: 89.6 kg     Exam General exam: Appears calm and comfortable  Respiratory system: scattered wheezing heard, on ra.  Cardiovascular system: S1 & S2 heard, RRR.  No pedal edema. Gastrointestinal system: Abdomen is nondistended, soft and nontender.  Central nervous system: Alert and oriented. No focal neurological deficits. Extremities: Symmetric 5 x 5 power. Skin: No rashes, lesions or ulcers Psychiatry: Judgement and insight appear normal. Mood & affect appropriate.    Data Reviewed:  I have personally reviewed following labs and imaging studies   CBC Lab Results  Component Value Date   WBC 8.0 08/28/2022   RBC 4.56 08/28/2022   HGB 12.8 08/28/2022   HCT 40.0 08/28/2022   MCV 87.7 08/28/2022   MCH 28.1 08/28/2022   PLT 282 08/28/2022   MCHC 32.0 08/28/2022   RDW 17.9 (H) 08/28/2022   LYMPHSABS 4.4 (H) 08/27/2022   MONOABS 0.7 08/27/2022   EOSABS 0.2 08/27/2022   BASOSABS 0.1 08/27/2022     Last metabolic panel Lab Results  Component Value Date   NA 136 08/28/2022   K 4.6 08/28/2022   CL 106 08/28/2022   CO2 27 08/28/2022   BUN 18 08/28/2022   CREATININE 0.89 08/28/2022   GLUCOSE 113 (H) 08/28/2022   GFRNONAA >60 08/28/2022   GFRAA 84 12/02/2020   CALCIUM 8.5 (L) 08/28/2022   PHOS 3.4 08/27/2022   PROT 6.2 (L) 08/28/2022   ALBUMIN 3.2 (L) 08/28/2022   LABGLOB 3.0 12/02/2020   AGRATIO 1.4 12/02/2020   BILITOT 0.4 08/28/2022   ALKPHOS 46 08/28/2022   AST 13 (L) 08/28/2022   ALT 16 08/28/2022   ANIONGAP 3 (L) 08/28/2022    CBG (last 3)  Recent Labs    08/28/22 2031 08/29/22 0725 08/29/22 1128  GLUCAP 137* 116* 84       Coagulation Profile: No results for input(s): "INR", "PROTIME" in the last 168 hours.   Radiology Studies: ECHOCARDIOGRAM COMPLETE  Result Date: 08/28/2022    ECHOCARDIOGRAM REPORT   Patient Name:   [name] Date of  Exam: 08/28/2022  Medical Rec #:  [id number]      Height:       65.0 in Accession #:    [id number]     Weight:       197.6 lb Date of Birth:  09/25/1966     BSA:          1.968 m Patient Age:    56 years       BP:           136/89 mmHg Patient Gender: F              HR:           73 bpm. Exam Location:  Inpatient Procedure: 2D Echo Indications:    Dyspnea; Grade I Diastology  History:        Patient has prior history of Echocardiogram examinations, most                 recent 12/25/2018. COPD; Risk Factors:Current Smoker and                 Hypertension.  Sonographer:    Cathie Hoops Referring Phys: [id number] DAVID MANUEL ORTIZ IMPRESSIONS  1. Left ventricular ejection fraction, by estimation, is 55 to 60%. The left ventricle has normal function. The left ventricle has no regional wall motion abnormalities. There is mild left ventricular hypertrophy. Left ventricular diastolic parameters were normal.  2. Right ventricular systolic function is normal. The right ventricular size is normal. There is mildly elevated pulmonary artery systolic pressure. The estimated right ventricular systolic pressure is 38.7 mmHg.  3. The mitral valve is normal in structure. Trivial mitral valve regurgitation. No evidence of mitral stenosis.  4. The aortic valve is tricuspid. Aortic valve regurgitation is not visualized. No aortic stenosis is present.  5. The inferior vena cava is normal in size with greater than 50% respiratory variability, suggesting right atrial pressure of 3 mmHg. FINDINGS  Left Ventricle: Left ventricular ejection fraction, by estimation, is 55 to 60%. The left ventricle has normal function. The left ventricle has no regional wall motion abnormalities. The left ventricular internal cavity size was normal in size. There is  mild left ventricular hypertrophy. Left ventricular diastolic parameters were normal. Right Ventricle: The right ventricular size is normal. No increase in right ventricular wall thickness. Right ventricular systolic  function is normal. There is mildly elevated pulmonary artery systolic pressure. The tricuspid regurgitant velocity is 2.77  m/s, and with an assumed right atrial pressure of 8 mmHg, the estimated right ventricular systolic pressure is 38.7 mmHg. Left Atrium: Left atrial size was normal in size. Right Atrium: Right atrial size was normal in size. Pericardium: Trivial pericardial effusion is present. Mitral Valve: The mitral valve is normal in structure. Trivial mitral valve regurgitation. No evidence of mitral valve stenosis. Tricuspid Valve: The tricuspid valve is normal in structure. Tricuspid valve regurgitation is trivial. No evidence of tricuspid stenosis. Aortic Valve: The aortic valve is tricuspid. Aortic valve regurgitation is not visualized. No aortic stenosis is present. Aortic valve mean gradient measures 5.0 mmHg. Aortic valve peak gradient measures 10.5 mmHg. Aortic valve area, by VTI measures 2.22  cm. Pulmonic Valve: The pulmonic valve was normal in structure. Pulmonic valve regurgitation is trivial. No evidence of pulmonic stenosis. Aorta: The aortic root is normal in size and structure. Venous: The inferior vena cava is normal in size with greater than 50% respiratory variability, suggesting right atrial pressure of 3 mmHg. IAS/Shunts: No atrial level shunt  detected by color flow Doppler.  LEFT VENTRICLE PLAX 2D LVIDd:         5.10 cm      Diastology LVIDs:         3.50 cm      LV e' medial:    7.62 cm/s LV PW:         1.00 cm      LV E/e' medial:  14.2 LV IVS:        0.90 cm      LV e' lateral:   7.83 cm/s LVOT diam:     2.00 cm      LV E/e' lateral: 13.8 LV SV:         58 LV SV Index:   30 LVOT Area:     3.14 cm  LV Volumes (MOD) LV vol d, MOD A2C: 119.0 ml LV vol d, MOD A4C: 106.0 ml LV vol s, MOD A2C: 50.3 ml LV vol s, MOD A4C: 50.6 ml LV SV MOD A2C:     68.7 ml LV SV MOD A4C:     106.0 ml LV SV MOD BP:      64.4 ml RIGHT VENTRICLE RV Basal diam:  3.90 cm RV Mid diam:    3.45 cm TAPSE  (M-mode): 2.0 cm LEFT ATRIUM             Index        RIGHT ATRIUM           Index LA diam:        4.00 cm 2.03 cm/m   RA Area:     17.60 cm LA Vol (A2C):   79.2 ml 40.24 ml/m  RA Volume:   52.20 ml  26.52 ml/m LA Vol (A4C):   42.1 ml 21.39 ml/m LA Biplane Vol: 60.6 ml 30.79 ml/m  AORTIC VALVE                     PULMONIC VALVE AV Area (Vmax):    1.98 cm      PV Vmax:          1.06 m/s AV Area (Vmean):   1.77 cm      PV Peak grad:     4.5 mmHg AV Area (VTI):     2.22 cm      PR End Diast Vel: 1.99 msec AV Vmax:           162.00 cm/s AV Vmean:          107.000 cm/s AV VTI:            0.263 m AV Peak Grad:      10.5 mmHg AV Mean Grad:      5.0 mmHg LVOT Vmax:         102.00 cm/s LVOT Vmean:        60.300 cm/s LVOT VTI:          0.186 m LVOT/AV VTI ratio: 0.71  AORTA Ao Root diam: 3.50 cm Ao Asc diam:  3.20 cm MITRAL VALVE                TRICUSPID VALVE MV Area (PHT): 3.17 cm     TR Peak grad:   30.7 mmHg MV Decel Time: 239 msec     TR Vmax:        277.00 cm/s MR Peak grad: 36.7 mmHg MR Vmax:      303.00 cm/s   SHUNTS MV E velocity: 108.00 cm/s  Systemic VTI:  0.19 m MV A velocity: 61.30 cm/s   Systemic Diam: 2.00 cm MV E/A ratio:  1.76 Weston BrassGayatri Acharya MD Electronically signed by Weston BrassGayatri Acharya MD Signature Date/Time: 08/28/2022/12:07:18 PM    Final    DG Chest Port 1 View  Result Date: 08/27/2022 CLINICAL DATA:  Wheezing and shortness of breath. History of asthma. EXAM: PORTABLE CHEST 1 VIEW COMPARISON:  None Available. FINDINGS: Heart size near the upper limit of normal. Clear lungs. Mild peribronchial thickening. Unremarkable bones. IMPRESSION: Mild bronchitic changes. Electronically Signed   By: Beckie SaltsSteven  Reid M.D.   On: 08/27/2022 16:06       Kathlen ModyVijaya Valli Randol M.D. [facility] 08/29/2022, 12:13 PM  Available via Epic secure chat 7am-7pm After 7 pm, please refer to night coverage provider listed on amion.

## 2022-08-30 ENCOUNTER — Other Ambulatory Visit (HOSPITAL_COMMUNITY): Payer: Self-pay

## 2022-08-30 ENCOUNTER — Other Ambulatory Visit (HOSPITAL_BASED_OUTPATIENT_CLINIC_OR_DEPARTMENT_OTHER): Payer: Self-pay

## 2022-08-30 DIAGNOSIS — Z720 Tobacco use: Secondary | ICD-10-CM

## 2022-08-30 LAB — CBC WITH DIFFERENTIAL/PLATELET
Abs Immature Granulocytes: 0.03 10*3/uL (ref 0.00–0.07)
Basophils Absolute: 0 10*3/uL (ref 0.0–0.1)
Basophils Relative: 0 %
Eosinophils Absolute: 0 10*3/uL (ref 0.0–0.5)
Eosinophils Relative: 0 %
HCT: 39 % (ref 36.0–46.0)
Hemoglobin: 12.7 g/dL (ref 12.0–15.0)
Immature Granulocytes: 0 %
Lymphocytes Relative: 13 %
Lymphs Abs: 1.4 10*3/uL (ref 0.7–4.0)
MCH: 28 pg (ref 26.0–34.0)
MCHC: 32.6 g/dL (ref 30.0–36.0)
MCV: 85.9 fL (ref 80.0–100.0)
Monocytes Absolute: 0.4 10*3/uL (ref 0.1–1.0)
Monocytes Relative: 4 %
Neutro Abs: 9 10*3/uL — ABNORMAL HIGH (ref 1.7–7.7)
Neutrophils Relative %: 83 %
Platelets: 280 10*3/uL (ref 150–400)
RBC: 4.54 MIL/uL (ref 3.87–5.11)
RDW: 18 % — ABNORMAL HIGH (ref 11.5–15.5)
WBC: 10.8 10*3/uL — ABNORMAL HIGH (ref 4.0–10.5)
nRBC: 0 % (ref 0.0–0.2)

## 2022-08-30 LAB — BASIC METABOLIC PANEL
Anion gap: 5 (ref 5–15)
BUN: 21 mg/dL — ABNORMAL HIGH (ref 6–20)
CO2: 26 mmol/L (ref 22–32)
Calcium: 8.9 mg/dL (ref 8.9–10.3)
Chloride: 107 mmol/L (ref 98–111)
Creatinine, Ser: 0.84 mg/dL (ref 0.44–1.00)
GFR, Estimated: >60 mL/min (ref >60)
Glucose, Bld: 117 mg/dL — ABNORMAL HIGH (ref 70–99)
Potassium: 4.6 mmol/L (ref 3.5–5.1)
Sodium: 138 mmol/L (ref 135–145)

## 2022-08-30 LAB — GLUCOSE, CAPILLARY
Glucose-Capillary: 160 mg/dL — ABNORMAL HIGH (ref 70–99)
Glucose-Capillary: 72 mg/dL (ref 70–99)

## 2022-08-30 MED ORDER — FLUTICASONE PROPIONATE 50 MCG/ACT NA SUSP
1.0000 | Freq: Every day | NASAL | 11 refills | Status: DC | PRN
  Filled 2022-08-30: qty 16, 34d supply, fill #0
  Filled 2022-08-31: qty 16, 30d supply, fill #0

## 2022-08-30 MED ORDER — IPRATROPIUM-ALBUTEROL 0.5-2.5 (3) MG/3ML IN SOLN
3.0000 mL | Freq: Four times a day (QID) | RESPIRATORY_TRACT | 3 refills | Status: DC | PRN
  Filled 2022-08-30 – 2022-08-31 (×2): qty 360, 30d supply, fill #0

## 2022-08-30 MED ORDER — LORATADINE 10 MG PO TABS: 10.0000 mg | ORAL_TABLET | Freq: Every day | ORAL | 0 refills | Status: DC

## 2022-08-30 MED ORDER — FLUCONAZOLE 100 MG PO TABS
200.0000 mg | ORAL_TABLET | Freq: Once | ORAL | 0 refills | Status: AC
  Filled 2022-08-30 – 2022-08-31 (×2): qty 2, 1d supply, fill #0

## 2022-08-30 MED ORDER — PREDNISONE 10 MG PO TABS
ORAL_TABLET | ORAL | 0 refills | Status: AC
  Filled 2022-08-30 – 2022-08-31 (×2): qty 14, 5d supply, fill #0

## 2022-08-30 MED ORDER — PANTOPRAZOLE SODIUM 40 MG PO TBEC: 40.0000 mg | DELAYED_RELEASE_TABLET | Freq: Every day | ORAL | 5 refills | Status: DC

## 2022-08-30 MED ORDER — BREZTRI AEROSPHERE 160-9-4.8 MCG/ACT IN AERO: 2.0000 | INHALATION_SPRAY | Freq: Two times a day (BID) | RESPIRATORY_TRACT | 5 refills | Status: DC

## 2022-08-30 MED ORDER — GABAPENTIN 300 MG PO CAPS
300.0000 mg | ORAL_CAPSULE | Freq: Three times a day (TID) | ORAL | 3 refills | Status: DC
  Filled 2022-08-31: qty 90, 30d supply, fill #0

## 2022-08-30 MED ORDER — METFORMIN HCL 500 MG PO TABS
500.0000 mg | ORAL_TABLET | Freq: Two times a day (BID) | ORAL | 0 refills | Status: DC
  Filled 2022-08-30 – 2022-08-31 (×2): qty 60, 30d supply, fill #0

## 2022-08-30 MED ORDER — BREZTRI AEROSPHERE 160-9-4.8 MCG/ACT IN AERO
2.0000 | INHALATION_SPRAY | Freq: Two times a day (BID) | RESPIRATORY_TRACT | 5 refills | Status: DC
  Filled 2022-08-30 – 2022-08-31 (×4): qty 10.7, 30d supply, fill #0

## 2022-08-30 MED ORDER — IPRATROPIUM-ALBUTEROL 0.5-2.5 (3) MG/3ML IN SOLN: 3.0000 mL | Freq: Four times a day (QID) | RESPIRATORY_TRACT | 3 refills | Status: DC | PRN

## 2022-08-30 MED ORDER — DOXYCYCLINE HYCLATE 100 MG PO TABS: 100.0000 mg | ORAL_TABLET | Freq: Two times a day (BID) | ORAL | 0 refills | Status: DC

## 2022-08-30 MED ORDER — BENZONATATE 200 MG PO CAPS
200.0000 mg | ORAL_CAPSULE | Freq: Three times a day (TID) | ORAL | 0 refills | Status: DC | PRN
  Filled 2022-08-30 – 2022-08-31 (×2): qty 20, 7d supply, fill #0

## 2022-08-30 MED ORDER — ASPIRIN 81 MG PO TBEC
81.0000 mg | DELAYED_RELEASE_TABLET | Freq: Every day | ORAL | 3 refills | Status: DC
  Filled 2022-08-30 – 2022-08-31 (×2): qty 30, 30d supply, fill #0

## 2022-08-30 MED ORDER — DOXYCYCLINE HYCLATE 100 MG PO TABS
100.0000 mg | ORAL_TABLET | Freq: Two times a day (BID) | ORAL | 0 refills | Status: AC
  Filled 2022-08-30 – 2022-08-31 (×2): qty 6, 3d supply, fill #0

## 2022-08-30 MED ORDER — FLUCONAZOLE 100 MG PO TABS: 200.0000 mg | ORAL_TABLET | Freq: Once | ORAL | 0 refills | Status: DC

## 2022-08-30 MED ORDER — MONTELUKAST SODIUM 10 MG PO TABS: 10.0000 mg | ORAL_TABLET | Freq: Every day | ORAL | 5 refills | Status: DC

## 2022-08-30 MED ORDER — PREDNISONE 10 MG (21) PO TBPK: ORAL_TABLET | ORAL | 0 refills | Status: DC

## 2022-08-30 MED ORDER — ALBUTEROL SULFATE HFA 108 (90 BASE) MCG/ACT IN AERS: INHALATION_SPRAY | RESPIRATORY_TRACT | 3 refills | Status: DC

## 2022-08-30 MED ORDER — ALBUTEROL SULFATE HFA 108 (90 BASE) MCG/ACT IN AERS
INHALATION_SPRAY | RESPIRATORY_TRACT | 3 refills | Status: DC
  Filled 2022-08-30 – 2022-08-31 (×2): qty 18, 25d supply, fill #0

## 2022-08-30 MED ORDER — BENZONATATE 200 MG PO CAPS: 200.0000 mg | ORAL_CAPSULE | Freq: Three times a day (TID) | ORAL | 0 refills | Status: DC | PRN

## 2022-08-30 NOTE — Progress Notes (Signed)
Discharge instructions given. Pt verb. Understanding. She refused ticket for ride from case management, & refused to wait for medications from meds to beds. Stated she will just go to [facility]. Provided RX script for prednisone, & explained to start in morning, as pt had solumedrol IV, prior to IV removal. She left via w/c with NT, asymptomatic of any signs or symptoms of distress & without event.

## 2022-08-30 NOTE — TOC Transition Note (Addendum)
Transition of Care [facility]) - CM/SW Discharge Note   Patient Details  Name: Kathleen Rodriguez MRN: [id number] Date of Birth: 1966/09/02  Transition of [facility]) CM/SW Contact:  Dessa Phi, RN Phone Number: 08/30/2022, 12:34 PM   Clinical Narrative: spoke to patient about d/c plans-prefers meds to bed-MD/Nsg/WL otpt pharmacy aware.Patient will contact someone for own transport home. No further CM needs.   -2:38p-Went to deliver bus pass for transport-patient has already left rm. No further CM needs.   Final next level of care: Home/Self Care Barriers to Discharge: No Barriers Identified   Patient Goals and CMS Choice Patient states their goals for this hospitalization and ongoing recovery are::  (Home) CMS Medicare.gov Compare Post Acute Care list provided to:: Patient Choice offered to / list presented to : Patient  Discharge Placement                       Discharge Plan and Services   Discharge Planning Services: CM Consult Post Acute Care Choice: NA                               Social Determinants of Health (SDOH) Interventions     Readmission Risk Interventions     No data to display

## 2022-08-30 NOTE — Progress Notes (Signed)
Mobility Specialist - Progress Note   08/30/22 1135  Mobility  Activity Ambulated independently in hallway  Level of Assistance Independent  Distance Ambulated (ft) 500 ft  Range of Motion/Exercises Active  Activity Response Tolerated well  Mobility Referral Yes  $Mobility charge 1 Mobility   Pt received in bed and agreeable to mobility. No complaints during mobility. Pt to room after session with all needs met & MD in room.  [facility]

## 2022-08-30 NOTE — Discharge Summary (Signed)
Physician Discharge Summary   Patient: Kathleen Rodriguez MRN: [id number] DOB: Mar 09, 1966  Admit date:     08/27/2022  Discharge date: 08/30/2022  Discharge Physician: Hosie Poisson   PCP: [name], MD   Recommendations at discharge:  Please follow up with PCP in one week.  Please follow up with Dr Halford Chessman in 2 weeks.   Discharge Diagnoses: Principal Problem:   COPD with acute exacerbation ([place]) Active Problems:   OSA (obstructive sleep apnea)   GERD (gastroesophageal reflux disease)   Tobacco abuse   Hypokalemia   Essential hypertension   Severe persistent asthma with acute exacerbation   Grief at loss of child   Itching in the vaginal area   Glucose intolerance   Hospital Course:   Kathleen Rodriguez is a 56 y.o. female with medical history significant of osteoarthritis, asthma, COPD, intubated once in 7062, diastolic dysfunction, history of cocaine abuse, critical illness myopathy, type 2 diabetes, GERD hypertension, influenza B, OSA on CPAP, history of pneumonia, former tobacco use until recently who is brought to the emergency department due to progressively worse dyspnea, fatigue and wheezing for the past 3 days after she got off of prednisone that was given to her for an early attack  She has smoked some cigarettes as she has been mourning the death of her son earlier this year.   She was admitted for acute copd exacerbation with acute bronchitis.    Assessment and Plan:   Acute copd exacerbation and acute bronchitis.  Much improved.  Currently on solumedrol 40 mg IV every 12 hours transioned to oral steroid quick taper on discharge.  Continue with duonebs, home inhalers and cough syrup.  Added doxycycline for acute bronchitis.        Type 2 DM;  Resume home meds on dsicharge.      OSA on CPAP.    Hypertension BP are optimal.      GERD on PPI.        Hypokalemia Replaced.    Estimated body mass index is 32.88 kg/m as calculated from the following:    Height as of this encounter: _0  (1.651 m).   Weight as of this encounter: 89.6 kg.    Consultants: none  Procedures performed: none.   Disposition: Home Diet recommendation:  Discharge Diet Orders (From admission, onward)     Start     Ordered   08/30/22 0000  Diet - low sodium heart healthy        08/30/22 1158           Carb modified diet DISCHARGE MEDICATION: Allergies as of 08/30/2022       Reactions   Tomato Hives, Itching, Other (See Comments)   ALSO REACTS TO KETCHUP   Latex Itching, Rash   Wool Alcohol [lanolin] Itching        Medication List     STOP taking these medications    ibuprofen 800 MG tablet Commonly known as: ADVIL       TAKE these medications    Accu-Chek Guide Me w/Device Kit Use to check blood sugar once daily.   Accu-Chek Guide test strip Generic drug: glucose blood Use as instructed daily before breakfast.   albuterol 108 (90 Base) MCG/ACT inhaler Commonly known as: ProAir HFA TAKE 2 PUFFS BY MOUTH EVERY 6 HOURS AS NEEDED FOR WHEEZE OR SHORTNESS OF BREATH What changed:  how much to take how to take this when to take this reasons to take this additional instructions Another medication  with the same name was removed. Continue taking this medication, and follow the directions you see here.   aspirin EC 81 MG tablet Take 1 tablet (81 mg total) by mouth daily.   benzonatate 200 MG capsule Commonly known as: TESSALON Take 1 capsule (200 mg total) by mouth 3 (three) times daily as needed for cough.   Breztri Aerosphere 160-9-4.8 MCG/ACT Aero Generic drug: Budeson-Glycopyrrol-Formoterol Inhale 2 puffs into the lungs in the morning and at bedtime.   cloNIDine 0.2 MG tablet Commonly known as: CATAPRES Take 1 tablet (0.2 mg total) by mouth 2 (two) times daily. For BP and hot flashes   doxycycline 100 MG tablet Commonly known as: VIBRA-TABS Take 1 tablet (100 mg total) by mouth every 12 (twelve) hours for 3 days.    fluconazole 100 MG tablet Commonly known as: Diflucan Take 2 tablets (200 mg total) by mouth once for 1 dose. Start taking on: September 01, 2022   fluticasone 50 MCG/ACT nasal spray Commonly known as: FLONASE Place 1 spray into both nostrils daily as needed for allergies.   gabapentin 300 MG capsule Commonly known as: NEURONTIN Take 1 capsule (300 mg total) by mouth 3 (three) times daily. What changed: when to take this   ipratropium-albuterol 0.5-2.5 (3) MG/3ML Soln Commonly known as: DUONEB Take 3 mLs by nebulization every 6 (six) hours as needed.   loratadine 10 MG tablet Commonly known as: CLARITIN Take 1 tablet (10 mg total) by mouth daily.   metFORMIN 500 MG tablet Commonly known as: GLUCOPHAGE TAKE 1 TABLET BY MOUTH 2 TIMES DAILY WITH A MEAL.   montelukast 10 MG tablet Commonly known as: SINGULAIR Take 1 tablet (10 mg total) by mouth at bedtime.   pantoprazole 40 MG tablet Commonly known as: PROTONIX Take 1 tablet (40 mg total) by mouth daily.   predniSONE 10 MG (21) Tbpk tablet Commonly known as: STERAPRED UNI-PAK 21 TAB Prednisone 40 mg daily for 2 days followed by  Prednisone 20 mg daily for 3 days. Start taking on: August 31, 2022               Durable Medical Equipment  (From admission, onward)           Start     Ordered   08/30/22 0000  For home use only DME Nebulizer machine       Question Answer Comment  Patient needs a nebulizer to treat with the following condition COPD (chronic obstructive pulmonary disease) ([place])   Length of Need Lifetime      08/30/22 1158            Follow-up Information     [name], MD. Schedule an appointment as soon as possible for a visit in 1 week(s).   Specialty: Family Medicine Contact information: [address] [place] [address] [phone]                Discharge Exam: Danley Danker Weights   08/27/22 2023  Weight: 89.6 kg   General exam: Appears calm and  comfortable  Respiratory system: Clear to auscultation. Respiratory effort normal. Cardiovascular system: S1 & S2 heard, RRR. No JVD, murmurs, rubs, gallops or clicks. No pedal edema. Gastrointestinal system: Abdomen is nondistended, soft and nontender. No organomegaly or masses felt. Normal bowel sounds heard. Central nervous system: Alert and oriented. No focal neurological deficits. Extremities: Symmetric 5 x 5 power. Skin: No rashes, lesions or ulcers Psychiatry: Judgement and insight appear normal. Mood & affect appropriate.  Condition at discharge: fair  The results of significant diagnostics from this hospitalization (including imaging, microbiology, ancillary and laboratory) are listed below for reference.   Imaging Studies: ECHOCARDIOGRAM COMPLETE  Result Date: 08/28/2022    ECHOCARDIOGRAM REPORT   Patient Name:   Kathleen Rodriguez Date of Exam: 08/28/2022 Medical Rec #:  [id number]      Height:       65.0 in Accession #:    [id number]     Weight:       197.6 lb Date of Birth:  February 11, 1966     BSA:          1.968 m Patient Age:    49 years       BP:           136/89 mmHg Patient Gender: F              HR:           73 bpm. Exam Location:  Inpatient Procedure: 2D Echo Indications:    Dyspnea; Grade I Diastology  History:        Patient has prior history of Echocardiogram examinations, most                 recent 12/25/2018. COPD; Risk Factors:Current Smoker and                 Hypertension.  Sonographer:    Harvie Junior Referring Phys: [id number] [place]  1. Left ventricular ejection fraction, by estimation, is 55 to 60%. The left ventricle has normal function. The left ventricle has no regional wall motion abnormalities. There is mild left ventricular hypertrophy. Left ventricular diastolic parameters were normal.  2. Right ventricular systolic function is normal. The right ventricular size is normal. There is mildly elevated pulmonary artery systolic pressure. The estimated  right ventricular systolic pressure is 94.4 mmHg.  3. The mitral valve is normal in structure. Trivial mitral valve regurgitation. No evidence of mitral stenosis.  4. The aortic valve is tricuspid. Aortic valve regurgitation is not visualized. No aortic stenosis is present.  5. The inferior vena cava is normal in size with greater than 50% respiratory variability, suggesting right atrial pressure of 3 mmHg. FINDINGS  Left Ventricle: Left ventricular ejection fraction, by estimation, is 55 to 60%. The left ventricle has normal function. The left ventricle has no regional wall motion abnormalities. The left ventricular internal cavity size was normal in size. There is  mild left ventricular hypertrophy. Left ventricular diastolic parameters were normal. Right Ventricle: The right ventricular size is normal. No increase in right ventricular wall thickness. Right ventricular systolic function is normal. There is mildly elevated pulmonary artery systolic pressure. The tricuspid regurgitant velocity is 2.77  m/s, and with an assumed right atrial pressure of 8 mmHg, the estimated right ventricular systolic pressure is 96.7 mmHg. Left Atrium: Left atrial size was normal in size. Right Atrium: Right atrial size was normal in size. Pericardium: Trivial pericardial effusion is present. Mitral Valve: The mitral valve is normal in structure. Trivial mitral valve regurgitation. No evidence of mitral valve stenosis. Tricuspid Valve: The tricuspid valve is normal in structure. Tricuspid valve regurgitation is trivial. No evidence of tricuspid stenosis. Aortic Valve: The aortic valve is tricuspid. Aortic valve regurgitation is not visualized. No aortic stenosis is present. Aortic valve mean gradient measures 5.0 mmHg. Aortic valve peak gradient measures 10.5 mmHg. Aortic valve area, by VTI measures 2.22  cm. Pulmonic Valve: The pulmonic valve was normal  in structure. Pulmonic valve regurgitation is trivial. No evidence of pulmonic  stenosis. Aorta: The aortic root is normal in size and structure. Venous: The inferior vena cava is normal in size with greater than 50% respiratory variability, suggesting right atrial pressure of 3 mmHg. IAS/Shunts: No atrial level shunt detected by color flow Doppler.  LEFT VENTRICLE PLAX 2D LVIDd:         5.10 cm      Diastology LVIDs:         3.50 cm      LV e' medial:    7.62 cm/s LV PW:         1.00 cm      LV E/e' medial:  14.2 LV IVS:        0.90 cm      LV e' lateral:   7.83 cm/s LVOT diam:     2.00 cm      LV E/e' lateral: 13.8 LV SV:         58 LV SV Index:   30 LVOT Area:     3.14 cm  LV Volumes (MOD) LV vol d, MOD A2C: 119.0 ml LV vol d, MOD A4C: 106.0 ml LV vol s, MOD A2C: 50.3 ml LV vol s, MOD A4C: 50.6 ml LV SV MOD A2C:     68.7 ml LV SV MOD A4C:     106.0 ml LV SV MOD BP:      64.4 ml RIGHT VENTRICLE RV Basal diam:  3.90 cm RV Mid diam:    3.45 cm TAPSE (M-mode): 2.0 cm LEFT ATRIUM             Index        RIGHT ATRIUM           Index LA diam:        4.00 cm 2.03 cm/m   RA Area:     17.60 cm LA Vol (A2C):   79.2 ml 40.24 ml/m  RA Volume:   52.20 ml  26.52 ml/m LA Vol (A4C):   42.1 ml 21.39 ml/m LA Biplane Vol: 60.6 ml 30.79 ml/m  AORTIC VALVE                     PULMONIC VALVE AV Area (Vmax):    1.98 cm      PV Vmax:          1.06 m/s AV Area (Vmean):   1.77 cm      PV Peak grad:     4.5 mmHg AV Area (VTI):     2.22 cm      PR End Diast Vel: 1.99 msec AV Vmax:           162.00 cm/s AV Vmean:          107.000 cm/s AV VTI:            0.263 m AV Peak Grad:      10.5 mmHg AV Mean Grad:      5.0 mmHg LVOT Vmax:         102.00 cm/s LVOT Vmean:        60.300 cm/s LVOT VTI:          0.186 m LVOT/AV VTI ratio: 0.71  AORTA Ao Root diam: 3.50 cm Ao Asc diam:  3.20 cm MITRAL VALVE                TRICUSPID VALVE MV Area (PHT): 3.17 cm     TR  Peak grad:   30.7 mmHg MV Decel Time: 239 msec     TR Vmax:        277.00 cm/s MR Peak grad: 36.7 mmHg MR Vmax:      303.00 cm/s   SHUNTS MV E velocity: 108.00  cm/s  Systemic VTI:  0.19 m MV A velocity: 61.30 cm/s   Systemic Diam: 2.00 cm MV E/A ratio:  1.76 Cherlynn Kaiser MD Electronically signed by Cherlynn Kaiser MD Signature Date/Time: 08/28/2022/12:07:18 PM    Final    DG Chest Port 1 View  Result Date: 08/27/2022 CLINICAL DATA:  Wheezing and shortness of breath. History of asthma. EXAM: PORTABLE CHEST 1 VIEW COMPARISON:  None Available. FINDINGS: Heart size near the upper limit of normal. Clear lungs. Mild peribronchial thickening. Unremarkable bones. IMPRESSION: Mild bronchitic changes. Electronically Signed   By: Claudie Revering M.D.   On: 08/27/2022 16:06    Microbiology: Results for orders placed or performed during the hospital encounter of 08/27/22  Resp Panel by RT-PCR (Flu A&B, Covid) Anterior Nasal Swab     Status: None   Collection Time: 08/27/22  2:44 PM   Specimen: Anterior Nasal Swab  Result Value Ref Range Status   SARS Coronavirus 2 by RT PCR NEGATIVE NEGATIVE Final    Comment: (NOTE) SARS-CoV-2 target nucleic acids are NOT DETECTED.  The SARS-CoV-2 RNA is generally detectable in upper respiratory specimens during the acute phase of infection. The lowest concentration of SARS-CoV-2 viral copies this assay can detect is 138 copies/mL. A negative result does not preclude SARS-Cov-2 infection and should not be used as the sole basis for treatment or other patient management decisions. A negative result may occur with  improper specimen collection/handling, submission of specimen other than nasopharyngeal swab, presence of viral mutation(s) within the areas targeted by this assay, and inadequate number of viral copies(<138 copies/mL). A negative result must be combined with clinical observations, patient history, and epidemiological information. The expected result is Negative.  Fact Sheet for Patients:  EntrepreneurPulse.com.au  Fact Sheet for Healthcare Providers:   IncredibleEmployment.be  This test is no t yet approved or cleared by the [place] FDA and  has been authorized for detection and/or diagnosis of SARS-CoV-2 by FDA under an Emergency Use Authorization (EUA). This EUA will remain  in effect (meaning this test can be used) for the duration of the COVID-19 declaration under Section 564(b)(1) of the Act, 21 U.S.C.section 360bbb-3(b)(1), unless the authorization is terminated  or revoked sooner.       Influenza A by PCR NEGATIVE NEGATIVE Final   Influenza B by PCR NEGATIVE NEGATIVE Final    Comment: (NOTE) The Xpert Xpress SARS-CoV-2/FLU/RSV plus assay is intended as an aid in the diagnosis of influenza from Nasopharyngeal swab specimens and should not be used as a sole basis for treatment. Nasal washings and aspirates are unacceptable for Xpert Xpress SARS-CoV-2/FLU/RSV testing.  Fact Sheet for Patients: EntrepreneurPulse.com.au  Fact Sheet for Healthcare Providers: IncredibleEmployment.be  This test is not yet approved or cleared by the [place] FDA and has been authorized for detection and/or diagnosis of SARS-CoV-2 by FDA under an Emergency Use Authorization (EUA). This EUA will remain in effect (meaning this test can be used) for the duration of the COVID-19 declaration under Section 564(b)(1) of the Act, 21 U.S.C. section 360bbb-3(b)(1), unless the authorization is terminated or revoked.  Performed at [facility], [place] [address], [place], [place] [address]   Respiratory (~20 pathogens) panel by PCR  Status: None   Collection Time: 08/27/22  7:54 PM   Specimen: Nasopharyngeal Swab; Respiratory  Result Value Ref Range Status   Adenovirus NOT DETECTED NOT DETECTED Final   Coronavirus 229E NOT DETECTED NOT DETECTED Final    Comment: (NOTE) The Coronavirus on the Respiratory Panel, DOES NOT test for the novel  Coronavirus (2019 nCoV)     Coronavirus HKU1 NOT DETECTED NOT DETECTED Final   Coronavirus NL63 NOT DETECTED NOT DETECTED Final   Coronavirus OC43 NOT DETECTED NOT DETECTED Final   Metapneumovirus NOT DETECTED NOT DETECTED Final   Rhinovirus / Enterovirus NOT DETECTED NOT DETECTED Final   Influenza A NOT DETECTED NOT DETECTED Final   Influenza B NOT DETECTED NOT DETECTED Final   Parainfluenza Virus 1 NOT DETECTED NOT DETECTED Final   Parainfluenza Virus 2 NOT DETECTED NOT DETECTED Final   Parainfluenza Virus 3 NOT DETECTED NOT DETECTED Final   Parainfluenza Virus 4 NOT DETECTED NOT DETECTED Final   Respiratory Syncytial Virus NOT DETECTED NOT DETECTED Final   Bordetella pertussis NOT DETECTED NOT DETECTED Final   Bordetella Parapertussis NOT DETECTED NOT DETECTED Final   Chlamydophila pneumoniae NOT DETECTED NOT DETECTED Final   Mycoplasma pneumoniae NOT DETECTED NOT DETECTED Final    Comment: Performed at [facility], [place]. [address] [place], [place] [address]    Labs: CBC: Recent Labs  Lab 08/27/22 1445 08/28/22 0450 08/30/22 0601  WBC 8.2 8.0 10.8*  NEUTROABS 2.8  --  9.0*  HGB 13.5 12.8 12.7  HCT 41.6 40.0 39.0  MCV 87.6 87.7 85.9  PLT 328 282 176   Basic Metabolic Panel: Recent Labs  Lab 08/27/22 1853 08/28/22 0450 08/30/22 0601  NA 138 136 138  K 3.2* 4.6 4.6  CL 104 106 107  CO2 _0 GLUCOSE 202* 113* 117*  BUN 15 18 21*  CREATININE 0.80 0.89 0.84  CALCIUM 8.5* 8.5* 8.9  PHOS 3.4  --   --    Liver Function Tests: Recent Labs  Lab 08/28/22 0450  AST 13*  ALT 16  ALKPHOS 46  BILITOT 0.4  PROT 6.2*  ALBUMIN 3.2*   CBG: Recent Labs  Lab 08/29/22 1128 08/29/22 1714 08/29/22 2051 08/30/22 0731 08/30/22 1118  GLUCAP 84 154* 125* 160* 72    Discharge time spent: 36 minutes   Signed: Hosie Poisson, MD [facility] 08/30/2022

## 2022-08-30 NOTE — Plan of Care (Signed)

## 2022-08-30 NOTE — Progress Notes (Signed)
  Transition of Care [facility]) Screening Note   Patient Details  Name: GABRELLE ROCA Date of Birth: [birth year]   Transition of Care [facility]) CM/SW Contact:    Dessa Phi, RN Phone Number: 08/30/2022, 12:24 PM    Transition of Care Department [facility]) has reviewed patient and no TOC needs have been identified at this time. We will continue to monitor patient advancement through interdisciplinary progression rounds. If new patient transition needs arise, please place a TOC consult.

## 2022-08-31 ENCOUNTER — Telehealth: Payer: Self-pay

## 2022-08-31 ENCOUNTER — Other Ambulatory Visit (HOSPITAL_COMMUNITY): Payer: Self-pay

## 2022-08-31 ENCOUNTER — Other Ambulatory Visit: Payer: Self-pay | Admitting: Obstetrics and Gynecology

## 2022-08-31 ENCOUNTER — Encounter: Payer: Self-pay | Admitting: Obstetrics and Gynecology

## 2022-08-31 DIAGNOSIS — J441 Chronic obstructive pulmonary disease with (acute) exacerbation: Secondary | ICD-10-CM

## 2022-08-31 MED ORDER — PREDNISONE 10 MG PO TABS
ORAL_TABLET | ORAL | 0 refills | Status: AC
  Filled 2022-08-31: qty 14, 5d supply, fill #0

## 2022-08-31 NOTE — Patient Outreach (Signed)
Medicaid Managed Care   Nurse Care Manager Note  08/31/2022 Name:  [name] MRN:  [id number] DOB:  [birth year]  Kathleen Rodriguez is an 56 y.o. year old female who is a primary patient of [name], MD.  The [facility] Managed Care Coordination team was consulted for assistance with:    Chronic healthcare management needs, COPD, osteoarthritis, HTN, DM, OSA, asthma, anxiety, GERD  Kathleen Rodriguez was given information about Medicaid Managed Care Coordination team services today. Kathleen Rodriguez Patient agreed to services and verbal consent obtained.  Engaged with patient by telephone for follow up visit in response to provider referral for case management and/or care coordination services.   Assessments/Interventions:  Review of past medical history, allergies, medications, health status, including review of consultants reports, laboratory and other test data, was performed as part of comprehensive evaluation and provision of chronic care management services.  SDOH (Social Determinants of Health) assessments and interventions performed: SDOH Interventions    Flowsheet Row Patient Outreach Telephone from 08/31/2022 in [place] Patient Outreach Telephone from 07/28/2022 in [place] Patient Outreach Telephone from 06/28/2022 in [place] Patient Outreach Telephone from 03/14/2022 in [place] Patient Outreach Telephone from 02/07/2022 in [place] Patient Outreach Telephone from 01/07/2022 in [place] Coordination  SDOH Interventions        Food Insecurity Interventions -- -- -- -- Intervention Not Indicated --  Housing Interventions -- -- Other (Comment)  [patient states she is moving to a hotel-siad it wasn't because of rent-did not elaborate] -- -- --  Transportation  Interventions -- -- -- -- -- Other (Comment)  [patient now utilizing [facility]]  Utilities Interventions -- -- Intervention Not Indicated -- -- --  Alcohol Usage Interventions -- Intervention Not Indicated (Score <7) -- -- -- --  Financial Strain Interventions Other (Comment)  [patient has received resources] -- -- -- -- --  Physical Activity Interventions -- -- -- --  [patient unable to engage in moderate to strenuous exercise] -- --  Stress Interventions Other (Comment)  [on medication and sees therapist] -- -- -- -- Other (Comment)  [sees therapist and is on medication]       Care Plan  Allergies  Allergen Reactions   Tomato Hives, Itching and Other (See Comments)    ALSO REACTS TO KETCHUP   Latex Itching and Rash   Wool Alcohol [Lanolin] Itching    Medications Reviewed Today     Reviewed by [name], RN (Registered Nurse) on 08/31/22 at [place] List Status: <None>   Medication Order Taking? Sig Documenting Provider Last Dose Status Informant  albuterol (PROAIR HFA) 108 (90 Base) MCG/ACT inhaler [id number]  TAKE 2 PUFFS BY MOUTH EVERY 6 HOURS AS NEEDED FOR WHEEZE OR SHORTNESS OF Darvin Neighbours, MD  Active   aspirin EC 81 MG tablet [id number]  Take 1 tablet (81 mg total) by mouth daily. Hosie Poisson, MD  Active   benzonatate (TESSALON) 200 MG capsule [id number]  Take 1 capsule (200 mg total) by mouth 3 (three) times daily as needed for cough. Hosie Poisson, MD  Active   Blood Glucose Monitoring Suppl (ACCU-CHEK GUIDE ME) w/Device KIT [id number] No Use to check blood sugar once daily. [name], MD Taking Active Self  Budeson-Glycopyrrol-Formoterol (BREZTRI AEROSPHERE) 160-9-4.8 MCG/ACT Hollie Salk [id number]  Inhale 2 puffs into the lungs in the morning  and at bedtime. Hosie Poisson, MD  Active   cloNIDine (CATAPRES) 0.2 MG tablet [id number] No Take 1 tablet (0.2 mg total) by mouth 2 (two) times daily. For BP and hot flashes Charlynne Cousins, MD 08/27/2022  Active Self  doxycycline (VIBRA-TABS) 100 MG tablet [id number]  Take 1 tablet (100 mg total) by mouth every 12 (twelve) hours for 3 days. Hosie Poisson, MD  Active   fluconazole (DIFLUCAN) 100 MG tablet [id number]  Take 2 tablets (200 mg total) by mouth once for 1 dose. Hosie Poisson, MD  Active   fluticasone Asencion Islam) 50 MCG/ACT nasal spray [id number]  Place 1 spray into both nostrils daily as needed for allergies. Hosie Poisson, MD  Active   gabapentin (NEURONTIN) 300 MG capsule [id number]  Take 1 capsule (300 mg total) by mouth 3 (three) times daily. Hosie Poisson, MD  Active   glucose blood (ACCU-CHEK GUIDE) test strip [id number] No Use as instructed daily before breakfast. [name], MD Taking Active Self  ipratropium-albuterol (DUONEB) 0.5-2.5 (3) MG/3ML SOLN [id number]  Take 3 mLs by nebulization every 6 (six) hours as needed. Hosie Poisson, MD  Active   loratadine (CLARITIN) 10 MG tablet [id number]  Take 1 tablet (10 mg total) by mouth daily. Hosie Poisson, MD  Active   metFORMIN (GLUCOPHAGE) 500 MG tablet [id number]  Take 1 tablet (500 mg total) by mouth 2 (two) times daily with a meal. Hosie Poisson, MD  Active   montelukast (SINGULAIR) 10 MG tablet [id number]  Take 1 tablet (10 mg total) by mouth at bedtime. Hosie Poisson, MD  Active   pantoprazole (PROTONIX) 40 MG tablet [id number]  Take 1 tablet (40 mg total) by mouth daily. Hosie Poisson, MD  Active   predniSONE (DELTASONE) 10 MG tablet [id number]  Take 4 tablets (40 mg total) by mouth daily for 2 days, THEN 2 tablets (20 mg total) daily for 3 days. Hosie Poisson, MD  Active             Patient Active Problem List   Diagnosis Date Noted   COPD with acute exacerbation ([place]) 08/27/2022   Grief at loss of child 08/27/2022   Itching in the vaginal area 08/27/2022   Glucose intolerance 08/27/2022   Seasonal allergic rhinitis 02/10/2022   Acute respiratory failure with hypoxia ([place]) 01/16/2022   Anxiety 10/17/2021   Asthma with COPD  10/16/2021   Hypocalcemia 10/16/2021   Left shoulder pain 10/16/2021   Severe persistent asthma with acute exacerbation 09/25/2021   Diabetes mellitus without complication ([place]) 30/06/2329   OSA (obstructive sleep apnea) 01/20/2021   Essential hypertension 01/20/2021   Obesity (BMI 30-39.9) 01/20/2021   Acute asthma exacerbation 10/09/2019   COPD mixed type ([place]) 07/04/2019   Asthma 10/22/2017   Hypokalemia 07/10/2017   Osteoarthritis of left knee 09/01/2016   Total knee replacement status 09/01/2016   Osteoarthritis of knees, 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   Tobacco abuse    Primary osteoarthritis of left knee 03/09/2016   Anemia, iron deficiency 08/12/2015   Dysfunctional uterine bleeding 12/28/2014   Morbid obesity ([place]) 05/16/2014   Upper airway cough syndrome 05/01/2014   GERD (gastroesophageal reflux disease) 04/08/2014   OSA (obstructive sleep apnea) 03/20/2013   Conditions to be addressed/monitored per PCP order:  Chronic healthcare management needs, COPD, osteoarthritis, HTN, DM, OSA, asthma, anxiety, GERD  Care Plan : General Plan of Care (Adult)  Updates made by [name], RN since 08/31/2022 12:00 AM     Problem: Health Promotion or Disease Self-Management (General Plan  of Care)   Priority: High  Onset Date: 02/24/2021     Long-Range Goal: Self-Management Plan Developed   Start Date: 02/24/2021  Expected End Date: 09/27/2022  Recent Progress: Not on track  Priority: High  Note:   Current Barriers:  Ineffective Self Health Maintenance Knowledge Deficits related to short term plan for care coordination needs and long term plans for chronic disease management needs 08/31/22:  Patient hospitalized 11/4-11/7/23 for COPD.  Patient has trouble affording medications-discussed waiving fees with pharmacy-patient to let me know if she is unable to get her medications.  Patient states she is still in her apartment-has not moved.  Blood sugars WNL Nurse Case Manager Clinical Goal(s):   patient will work with care management team to address care coordination and chronic disease management needs related to Disease Management Educational Needs Care Coordination Medication Management and Education Medication Reconciliation Medication Assistance  Psychosocial Support   Interventions:  Evaluation of current treatment plan and patient's adherence to plan as established by provider. Reviewed medications with patient. Collaborated with pharmacy regarding medications. Discussed plans with patient for ongoing care management follow up and provided patient with direct contact information for care management team Advised patient, providing education and rationale, to monitor blood pressure daily and record, calling provider for findings outside established parameters.  Reviewed scheduled/upcoming provider appointments. Advised patient, providing education and rationale, to check cbg, BP and record, calling provider for findings outside established parameters.   Care guide referral for financial resources for rent, medicines-07/02/21-completed-patient provided resources. Care Guide referral for transportation. Update 04/12/21:  RNCM provided transportation resources to patient. Pharmacy referral for medication review. Update 04/12/21:  Patient has met with Pharmacist and continues to follow. Self Care Activities:  Patient will self administer medications as prescribed Patient will attend all scheduled provider appointments Patient will call pharmacy for medication refills Patient will continue to perform ADL's independently Patient will call provider office for new concerns or questions Patient to follow up with PCP regarding diabetic shoes. Patient Goals: In the next 30 days, patient will check blood pressure and blood sugars as directed by provider. In the next 30 days, patient will attend all scheduled appointments. In the next 30 days, patient will meet with Pharmacist to discuss  medications.- In the next 30 days, patient will schedule follow up appointments. Follow Up Plan: The patient has been provided with contact information for the care management team and has been advised to call with any health related questions or concerns.  The care management team will reach out to the patient again over the next 30  business days.     Follow Up:  Patient agrees to Care Plan and Follow-up.  Plan: The Managed Medicaid care management team will reach out to the patient again over the next 30 business  days. and The  Patient has been provided with contact information for the Managed Medicaid care management team and has been advised to call with any health related questions or concerns.  Date/time of next scheduled RN care management/care coordination outreach:  09/30/22 at 315

## 2022-08-31 NOTE — Patient Instructions (Signed)
Hi Kathleen Rodriguez, thanks for speaking with me-please let me know if you are unable to get your medications.  Kathleen Rodriguez was given information about Medicaid Managed Care team care coordination services as a part of their [place] Medicaid benefit. Kathleen Rodriguez verbally consented to engagement with the [facility] Managed Care team.   If you are experiencing a medical emergency, please call 911 or report to your local emergency department or urgent care.   If you have a non-emergency medical problem during routine business hours, please contact your provider's office and ask to speak with a nurse.   For questions related to your [facility], please call: [phone] or visit the homepage here: [email or web address]  If you would like to schedule transportation through your [facility], please call the following number at least 2 days in advance of your appointment: [phone]   Rides for urgent appointments can also be made after hours by calling Member Services.  Call the [place] at [phone], at any time, 24 hours a day, 7 days a week. If you are in danger or need immediate medical attention call 911.  If you would like help to quit smoking, call 1-800-QUIT-NOW 415-173-7545) OR Espaol: 1-855-Djelo-Ya (8-916-945-0388) o para ms informacin haga clic aqu or Text READY to 200-400 to register via text  Kathleen Rodriguez - following are the goals we discussed in your visit today:   Goals Addressed             This Visit's Progress    Protect My Health       Timeframe:  Long-Range Goal Priority:  High Start Date:        02/24/21                     Expected End Date:       ongoing            Follow Up Date: 09/30/22   - schedule appointment for flu shot - schedule appointment for vaccines needed due to my age or health - schedule recommended  health tests (blood work, mammogram, colonoscopy, pap test) - schedule and keep appointment for annual check-up   08/31/22:  Patient has follow up appt scheduled 09/22/22  Why is this important?   Screening tests can find diseases early when they are easier to treat.  Your doctor or nurse will talk with you about which tests are important for you.  Getting shots for common diseases like the flu and shingles will help prevent them.     Patient verbalizes understanding of instructions and care plan provided today and agrees to view in [place]. Active MyChart status and patient understanding of how to access instructions and care plan via MyChart confirmed with patient.     The Managed Medicaid care management team will reach out to the patient again over the next 30 business  days.  The  Patient  has been provided with contact information for the Managed Medicaid care management team and has been advised to call with any health related questions or concerns.   [name] RN, BSN [place] Management Coordinator - Managed Medicaid High Risk [phone]   Following is a copy of your plan of care:  Care Plan : General Plan of Care (Adult)  Updates made by [name], RN since 08/31/2022 12:00 AM     Problem: Health Promotion or Disease  Self-Management (General Plan of Care)   Priority: High  Onset Date: 02/24/2021     Long-Range Goal: Self-Management Plan Developed   Start Date: 02/24/2021  Expected End Date: 09/27/2022  Recent Progress: Not on track  Priority: High  Note:   Current Barriers:  Ineffective Self Health Maintenance Knowledge Deficits related to short term plan for care coordination needs and long term plans for chronic disease management needs 08/31/22:  Patient hospitalized 11/4-11/7/23 for COPD.  Patient has trouble affording medications-discussed waiving fees with pharmacy-patient to let me know if she is unable to get her  medications.  Patient states she is still in her apartment-has not moved.  Blood sugars WNL Nurse Case Manager Clinical Goal(s):  patient will work with care management team to address care coordination and chronic disease management needs related to Disease Management Educational Needs Care Coordination Medication Management and Education Medication Reconciliation Medication Assistance  Psychosocial Support   Interventions:  Evaluation of current treatment plan and patient's adherence to plan as established by provider. Reviewed medications with patient. Collaborated with pharmacy regarding medications. Discussed plans with patient for ongoing care management follow up and provided patient with direct contact information for care management team Advised patient, providing education and rationale, to monitor blood pressure daily and record, calling provider for findings outside established parameters.  Reviewed scheduled/upcoming provider appointments. Advised patient, providing education and rationale, to check cbg, BP and record, calling provider for findings outside established parameters.   Care guide referral for financial resources for rent, medicines-07/02/21-completed-patient provided resources. Care Guide referral for transportation. Update 04/12/21:  RNCM provided transportation resources to patient. Pharmacy referral for medication review. Update 04/12/21:  Patient has met with Pharmacist and continues to follow. Self Care Activities:  Patient will self administer medications as prescribed Patient will attend all scheduled provider appointments Patient will call pharmacy for medication refills Patient will continue to perform ADL's independently Patient will call provider office for new concerns or questions Patient to follow up with PCP regarding diabetic shoes. Patient Goals: In the next 30 days, patient will check blood pressure and blood sugars as directed by provider. In  the next 30 days, patient will attend all scheduled appointments. In the next 30 days, patient will meet with Pharmacist to discuss medications.- In the next 30 days, patient will schedule follow up appointments. Follow Up Plan: The patient has been provided with contact information for the care management team and has been advised to call with any health related questions or concerns.  The care management team will reach out to the patient again over the next 30  business days.

## 2022-08-31 NOTE — Telephone Encounter (Signed)
Transition Care Management Follow-up Telephone Call Date of discharge and from where: Kathleen Rodriguez 08/30/2022 How have you been since you were released from the hospital? better Any questions or concerns? No  Items Reviewed: Did the pt receive and understand the discharge instructions provided? Yes  Medications obtained and verified? No , patients states is out of all meds and has not picked up antibiotics or prednisone yet. She states she is going to borrow some money for get meds Other? No  Any new allergies since your discharge? No  Dietary orders reviewed? Yes Do you have support at home? Yes   Home Care and Equipment/Supplies: Were home health services ordered? not applicable If so, what is the name of the agency? N/a  Has the agency set up a time to come to the patient's home? not applicable Were any new equipment or medical supplies ordered?  No What is the name of the medical supply agency? N/a Were you able to get the supplies/equipment? not applicable Do you have any questions related to the use of the equipment or supplies? No  Functional Questionnaire: (I = Independent and D = Dependent) ADLs: I  Bathing/Dressing- I  Meal Prep- I  Eating- I  Maintaining continence- I  Transferring/Ambulation- I  Managing Meds- I  Follow up appointments reviewed:  PCP Hospital f/u appt confirmed? Yes  Scheduled to see Dr Alvis Lemmings on 09/09/2022 @ 9:30. Specialist Hospital f/u appt confirmed? No  Are transportation arrangements needed? No  If their condition worsens, is the pt aware to call PCP or go to the Emergency Dept.? Yes Was the patient provided with contact information for the PCP's office or ED? Yes Was to pt encouraged to call back with questions or concerns? Yes [name], LPN [facility] Nurse Health Advisor Direct Dial [phone]

## 2022-09-01 ENCOUNTER — Other Ambulatory Visit (HOSPITAL_COMMUNITY): Payer: Self-pay

## 2022-09-09 ENCOUNTER — Inpatient Hospital Stay: Payer: Medicaid Other | Admitting: Family Medicine

## 2022-09-12 ENCOUNTER — Inpatient Hospital Stay: Payer: Medicaid Other | Admitting: Nurse Practitioner

## 2022-09-20 ENCOUNTER — Inpatient Hospital Stay: Payer: Medicaid Other | Admitting: Nurse Practitioner

## 2022-09-21 NOTE — Progress Notes (Deleted)
Patient ID: Kathleen Rodriguez, female   DOB: [birth year], 56 y.o.   MRN: [id number]   After hospitalization Discharge Diagnoses: Principal Problem:   COPD with acute exacerbation (HCC) Active Problems:   OSA (obstructive sleep apnea)   GERD (gastroesophageal reflux disease)   Tobacco abuse   Hypokalemia   Essential hypertension   Severe persistent asthma with acute exacerbation   Grief at loss of child   Itching in the vaginal area   Glucose intolerance     Hospital Course:    Kathleen Rodriguez is a 56 y.o. female with medical history significant of osteoarthritis, asthma, COPD, intubated once in 2016, diastolic dysfunction, history of cocaine abuse, critical illness myopathy, type 2 diabetes, GERD hypertension, influenza B, OSA on CPAP, history of pneumonia, former tobacco use until recently who is brought to the emergency department due to progressively worse dyspnea, fatigue and wheezing for the past 3 days after she got off of prednisone that was given to her for an early attack  She has smoked some cigarettes as she has been mourning the death of her son earlier this year.   She was admitted for acute copd exacerbation with acute bronchitis.    Assessment and Plan:     Acute copd exacerbation and acute bronchitis.  Much improved.  Currently on solumedrol 40 mg IV every 12 hours transioned to oral steroid quick taper on discharge.  Continue with duonebs, home inhalers and cough syrup.  Added doxycycline for acute bronchitis.        Type 2 DM;  Resume home meds on dsicharge.      OSA on CPAP.    Hypertension BP are optimal.      GERD on PPI.        Hypokalemia Replaced.    Estimated body mass index is 32.88 kg/m as calculated from the following:   Height as of this encounter: 5\' 5"  (1.651 m).   Weight as of this encounter: 89.6 kg.

## 2022-09-22 ENCOUNTER — Ambulatory Visit: Payer: Medicaid Other | Admitting: Physician Assistant

## 2022-09-30 ENCOUNTER — Other Ambulatory Visit: Payer: Medicaid Other | Admitting: Obstetrics and Gynecology

## 2022-09-30 NOTE — Patient Instructions (Signed)
Hi Kathleen Rodriguez, sorry to have missed you today-I hope you are doing okay- as a part of your Medicaid benefit, you are eligible for care management and care coordination services at no cost or copay. I was unable to reach you by phone today but would be happy to help you with your health related needs. Please feel free to call me at [phone]  A member of the Managed Medicaid care management team will reach out to you again over the next 30 business  days.   [name] RN, BSN [place]  [employer] Engineer, production - Managed Medicaid High Risk [phone].

## 2022-09-30 NOTE — Patient Outreach (Signed)
Care Coordination  09/30/2022  DAVIONNE MASTRANGELO November 21, 1965 [id number]   Medicaid Managed Care   Unsuccessful Outreach Note  09/30/2022 Name: SHEYLA ZAFFINO MRN: [id number] DOB: [birth year]  Referred by: Hoy Register, MD Reason for referral : High Risk Managed Medicaid (Unsuccessful telephone outreach)   An unsuccessful telephone outreach was attempted today. The patient was referred to the case management team for assistance with care management and care coordination.   Follow Up Plan: The care management team will reach out to the patient again over the next 30 business  days.   [name] RN, BSN [place]  [employer], production - Managed Medicaid High Risk [phone].

## 2022-11-04 ENCOUNTER — Other Ambulatory Visit: Payer: Medicaid Other | Admitting: Obstetrics and Gynecology

## 2022-11-04 NOTE — Patient Outreach (Signed)
Care Coordination  11/04/2022  Kathleen Rodriguez December 05, 1965 [id number]   Medicaid Managed Care   Unsuccessful Outreach Note  11/04/2022 Name: Kathleen Rodriguez MRN: [id number] DOB: Aug 08, 1966  Referred by: [name], MD Reason for referral : High Risk Managed Medicaid (Unsuccessful telephone outreach)   A second unsuccessful telephone outreach was attempted today. The patient was referred to the case management team for assistance with care management and care coordination.   Follow Up Plan: The care management team will reach out to the patient again over the next 30 business  days.   [name] RN, BSN [place]  [employer] Curator - Managed Medicaid High Risk [phone]

## 2022-11-04 NOTE — Patient Instructions (Signed)
Hi Kathleen Rodriguez, I am sorry to have missed you again today-I hope you are well- as a part of your Medicaid benefit, you are eligible for care management and care coordination services at no cost or copay. I was unable to reach you by phone today but would be happy to help you with your health related needs. Please feel free to call me at [phone].  A member of the Managed Medicaid care management team will reach out to you again over the next 30 business days.   [name] RN, BSN [place]  Tri[employer] Curator - Managed Medicaid High Risk [phone]

## 2022-11-09 ENCOUNTER — Telehealth: Payer: Self-pay

## 2022-11-09 NOTE — Telephone Encounter (Signed)
..  Medicaid Managed Care Note  11/09/2022 Name: Kathleen Rodriguez MRN: [id number] DOB: 11-04-1965  Kathleen Rodriguez is a 57 y.o. year old female who is a primary care patient of [name], MD and is actively engaged with the care management team. I reached out to Kathleen Rodriguez by phone today to assist with re-scheduling a follow up visit with the RN Case Manager  Follow up plan:  The care management team will reach out to the patient again over the next 7 days. The patient did not answer when I called her and her VM was full.  [place]

## 2022-11-15 ENCOUNTER — Other Ambulatory Visit: Payer: Medicaid Other | Admitting: Obstetrics and Gynecology

## 2022-11-15 NOTE — Patient Outreach (Signed)
Care Coordination  11/15/2022  KIMBERLI WINNE 01-Aug-1966 [id number]   Medicaid Managed Care   Unsuccessful Outreach Note  11/15/2022 Name: TYNIA WIERS MRN: [id number] DOB: 08-01-1966  Referred by: [name], MD Reason for referral : High Risk Managed Medicaid (Unsuccessful telephone outreach)   Third unsuccessful telephone outreach was attempted.  The patient was referred to the case management team for assistance with care management and care coordination. The patient's primary care provider has been notified of our unsuccessful attempts to make or maintain contact with the patient. The care management team is pleased to engage with this patient at any time in the future should he/she be interested in assistance from the care management team.   Follow Up Plan: The patient has been provided with contact information for the care management team and has been advised to call with any health related questions or concerns.  We have been unable to make contact with the patient for follow up. The care management team is available to follow up with the patient after provider conversation with the patient regarding recommendation for care management engagement and subsequent re-referral to the care management team.   [name] RN, BSN [place] Management Coordinator - Managed [place] High Risk [phone]

## 2022-11-15 NOTE — Patient Instructions (Signed)
Hi Kathleen Rodriguez, I am sorry we have been unable to reach you - as a part of your Medicaid benefit, you are eligible for care management and care coordination services at no cost or copay. I was unable to reach you by phone today but would be happy to help you with your health related needs. Please feel free to call me at [phone].  [name] RN, BSN [place]  [facility] - Managed Medicaid High Risk [phone]

## 2022-12-02 ENCOUNTER — Ambulatory Visit: Payer: Self-pay | Admitting: *Deleted

## 2022-12-02 NOTE — Telephone Encounter (Signed)
C/o foot swelling and bleeding and feet split . Upon transfer from agent patient hung up. NT attempted to contact patient to triage sx [phone] no answer and VM full unable to leave message.

## 2022-12-02 NOTE — Telephone Encounter (Signed)
Call placed to patient unable to reach message left on VM.

## 2022-12-14 ENCOUNTER — Other Ambulatory Visit: Payer: Self-pay | Admitting: Family Medicine

## 2022-12-14 DIAGNOSIS — J4551 Severe persistent asthma with (acute) exacerbation: Secondary | ICD-10-CM

## 2022-12-14 DIAGNOSIS — R7303 Prediabetes: Secondary | ICD-10-CM

## 2022-12-14 NOTE — Telephone Encounter (Signed)
Requested medication (s) are due for refill today: yes  Requested medication (s) are on the active medication list: yes  Last refill:  09/19/22  Future visit scheduled: yes  Notes to clinic:  Unable to refill per protocol, last refill by another provider.      Requested Prescriptions  Pending Prescriptions Disp Refills   pantoprazole (PROTONIX) 40 MG tablet 30 tablet 5    Sig: Take 1 tablet (40 mg total) by mouth daily.     Gastroenterology: Proton Pump Inhibitors Failed - 12/14/2022 12:17 PM      Failed - Valid encounter within last 12 months    Recent Outpatient Visits           1 year ago Essential hypertension   [place] Elsie Stain, MD   1 year ago Mixed stress and urge urinary incontinence   [place] [name], MD   2 years ago Annual physical exam   [place], Charlane Ferretti, MD   3 years ago Acute midline low back pain without sciatica   [place], [name], MD   3 years ago Vaginal candidiasis   [place], Charlane Ferretti, MD       Future Appointments             In 2 months [name], MD [place]             metFORMIN (GLUCOPHAGE) 500 MG tablet 60 tablet 0    Sig: Take 1 tablet (500 mg total) by mouth 2 (two) times daily with a meal.     Endocrinology:  Diabetes - Biguanides Failed - 12/14/2022 12:17 PM      Failed - B12 Level in normal range and within 720 days    Vitamin B-12  Date Value Ref Range Status  09/05/2018 529 232 - 1,245 pg/mL Final         Failed - Valid encounter within last 6 months    Recent Outpatient Visits           1 year ago Essential hypertension   [place] Elsie Stain, MD   1 year ago Mixed stress and urge urinary incontinence   [place] [name], MD   2 years ago Annual physical exam   [place], Charlane Ferretti, MD   3 years ago Acute midline low back pain without sciatica   [place], [name], MD   3 years ago Vaginal candidiasis   [place], Charlane Ferretti, MD       Future Appointments             In 2 months [name], MD [place] in normal range and within 360 days    Creatinine, Ser  Date Value Ref Range Status  08/30/2022 0.84 0.44 - 1.00 mg/dL Final         Passed - HBA1C is between 0 and 7.9 and within 180 days    Hgb A1c MFr Bld  Date Value Ref Range Status  08/27/2022 5.5 4.8 - 5.6 % Final  Comment:    (NOTE) Pre diabetes:          5.7%-6.4%  Diabetes:              >6.4%  Glycemic control for   <7.0% adults with diabetes          Passed - eGFR in normal range and within 360 days    GFR calc Af Amer  Date Value Ref Range Status  12/02/2020 84 >59 mL/min/1.73 Final    Comment:    **In accordance with recommendations from the NKF-ASN Task force,**   [facility] is in the process of updating its eGFR calculation to the   2021 CKD-EPI creatinine equation that estimates kidney function   without a race variable.    GFR, Estimated  Date Value Ref Range Status  08/30/2022 >60 >60 mL/min Final    Comment:    (NOTE) Calculated using the CKD-EPI Creatinine Equation (2021)          Passed - CBC within normal limits and completed in the last 12 months    WBC  Date Value Ref Range Status  08/30/2022 10.8 (H) 4.0 - 10.5 K/uL Final   RBC  Date Value Ref Range Status  08/30/2022 4.54 3.87 - 5.11 MIL/uL Final   Hemoglobin  Date Value Ref Range Status  08/30/2022 12.7 12.0 - 15.0 g/dL Final  12/02/2020 14.8 11.1 - 15.9 g/dL Final   HCT  Date Value Ref  Range Status  08/30/2022 39.0 36.0 - 46.0 % Final   Hematocrit  Date Value Ref Range Status  12/02/2020 46.9 (H) 34.0 - 46.6 % Final   MCHC  Date Value Ref Range Status  08/30/2022 32.6 30.0 - 36.0 g/dL Final   [facility]  Date Value Ref Range Status  08/30/2022 28.0 26.0 - 34.0 pg Final   MCV  Date Value Ref Range Status  08/30/2022 85.9 80.0 - 100.0 fL Final  12/02/2020 87 79 - 97 fL Final   No results found for: "PLTCOUNTKUC", "LABPLAT", "POCPLA" RDW  Date Value Ref Range Status  08/30/2022 18.0 (H) 11.5 - 15.5 % Final  12/02/2020 13.6 11.7 - 15.4 % Final          loratadine (CLARITIN) 10 MG tablet 30 tablet 0    Sig: Take 1 tablet (10 mg total) by mouth daily.     Ear, Nose, and Throat:  Antihistamines 2 Failed - 12/14/2022 12:17 PM      Failed - Valid encounter within last 12 months    Recent Outpatient Visits           1 year ago Essential hypertension   [place] Elsie Stain, MD   1 year ago Mixed stress and urge urinary incontinence   [place] [name], MD   2 years ago Annual physical exam   [place], Charlane Ferretti, MD   3 years ago Acute midline low back pain without sciatica   [place], [name], MD   3 years ago Vaginal candidiasis   [place], [name], MD       Future Appointments             In 2 months [name], MD [place] in normal range and  within 360 days    Creatinine, Ser  Date Value Ref Range Status  08/30/2022 0.84 0.44 - 1.00 mg/dL Final          gabapentin (NEURONTIN) 300 MG capsule 90 capsule 3    Sig: Take 1 capsule (300 mg total) by mouth 3 (three) times daily.     Neurology: Anticonvulsants - gabapentin Failed - 12/14/2022 12:17 PM      Failed -  Completed PHQ-2 or PHQ-9 in the last 360 days      Failed - Valid encounter within last 12 months    Recent Outpatient Visits           1 year ago Essential hypertension   [place], Patrick E, MD   1 year ago Mixed stress and urge urinary incontinence   [place] [name], MD   2 years ago Annual physical exam   [place], Charlane Ferretti, MD   3 years ago Acute midline low back pain without sciatica   [place], Charlane Ferretti, MD   3 years ago Vaginal candidiasis   [place], Charlane Ferretti, MD       Future Appointments             In 2 months [name], MD [place] in normal range and within 360 days    Creatinine, Ser  Date Value Ref Range Status  08/30/2022 0.84 0.44 - 1.00 mg/dL Final          cloNIDine (CATAPRES) 0.2 MG tablet 60 tablet 2    Sig: Take 1 tablet (0.2 mg total) by mouth 2 (two) times daily. For BP and hot flashes     Cardiovascular:  Alpha-2 Agonists Failed - 12/14/2022 12:17 PM      Failed - Valid encounter within last 6 months    Recent Outpatient Visits           1 year ago Essential hypertension   [place] Elsie Stain, MD   1 year ago Mixed stress and urge urinary incontinence   [place] [name], MD   2 years ago Annual physical exam   [place], Charlane Ferretti, MD   3 years ago Acute midline low back pain without sciatica   [place], [name], MD   3 years ago Vaginal candidiasis   [place], [name], MD       Future Appointments             In 2 months [name], MD [place] in normal range    BP Readings from Last 1 Encounters:  08/30/22 106/64         Passed - Last Heart Rate in normal range    Pulse Readings from Last 1 Encounters:  08/30/22 73

## 2022-12-14 NOTE — Telephone Encounter (Signed)
Medication Refill - Medication:  cloNIDine (CATAPRES) 0.2 MG tablet /claritin/ acid reflux med/(doesn't have the name of/gabapetin, metformin /muscle relaxer/she doesn't have the name of  Has the patient contacted their pharmacy? yes (Agent: If no, request that the patient contact the pharmacy for the refill. If patient does not wish to contact the pharmacy document the reason why and proceed with request.) (Agent: If yes, when and what did the pharmacy advise?)contact pcp  Preferred Pharmacy (with phone number or street name): [id number] [place] Phone: [phone]: [phone] Has the patient been seen for an appointment in the last year OR does the patient have an upcoming appointment? yes  Agent: Please be advised that RX refills may take up to 3 business days. We ask that you follow-up with your pharmacy.

## 2022-12-30 ENCOUNTER — Emergency Department (HOSPITAL_COMMUNITY): Payer: Medicaid Other

## 2022-12-30 ENCOUNTER — Inpatient Hospital Stay (HOSPITAL_COMMUNITY)
Admission: EM | Admit: 2022-12-30 | Discharge: 2023-01-02 | DRG: 190 | Disposition: A | Discharge status: Home / Self Care | Payer: Medicaid Other | Attending: Internal Medicine | Admitting: Internal Medicine

## 2022-12-30 DIAGNOSIS — F419 Anxiety disorder, unspecified: Secondary | ICD-10-CM | POA: Diagnosis present

## 2022-12-30 DIAGNOSIS — J45901 Unspecified asthma with (acute) exacerbation: Secondary | ICD-10-CM | POA: Diagnosis present

## 2022-12-30 DIAGNOSIS — G4733 Obstructive sleep apnea (adult) (pediatric): Secondary | ICD-10-CM | POA: Diagnosis present

## 2022-12-30 DIAGNOSIS — Z809 Family history of malignant neoplasm, unspecified: Secondary | ICD-10-CM

## 2022-12-30 DIAGNOSIS — Z830 Family history of human immunodeficiency virus [HIV] disease: Secondary | ICD-10-CM

## 2022-12-30 DIAGNOSIS — K219 Gastro-esophageal reflux disease without esophagitis: Secondary | ICD-10-CM | POA: Diagnosis present

## 2022-12-30 DIAGNOSIS — Z8249 Family history of ischemic heart disease and other diseases of the circulatory system: Secondary | ICD-10-CM

## 2022-12-30 DIAGNOSIS — J4551 Severe persistent asthma with (acute) exacerbation: Secondary | ICD-10-CM

## 2022-12-30 DIAGNOSIS — Z91048 Other nonmedicinal substance allergy status: Secondary | ICD-10-CM

## 2022-12-30 DIAGNOSIS — Z87891 Personal history of nicotine dependence: Secondary | ICD-10-CM

## 2022-12-30 DIAGNOSIS — I110 Hypertensive heart disease with heart failure: Secondary | ICD-10-CM | POA: Diagnosis present

## 2022-12-30 DIAGNOSIS — J441 Chronic obstructive pulmonary disease with (acute) exacerbation: Principal | ICD-10-CM | POA: Diagnosis present

## 2022-12-30 DIAGNOSIS — R0602 Shortness of breath: Secondary | ICD-10-CM | POA: Diagnosis not present

## 2022-12-30 DIAGNOSIS — E669 Obesity, unspecified: Secondary | ICD-10-CM | POA: Diagnosis present

## 2022-12-30 DIAGNOSIS — J9602 Acute respiratory failure with hypercapnia: Secondary | ICD-10-CM | POA: Diagnosis present

## 2022-12-30 DIAGNOSIS — Z825 Family history of asthma and other chronic lower respiratory diseases: Secondary | ICD-10-CM

## 2022-12-30 DIAGNOSIS — J9601 Acute respiratory failure with hypoxia: Secondary | ICD-10-CM | POA: Diagnosis present

## 2022-12-30 DIAGNOSIS — Z96652 Presence of left artificial knee joint: Secondary | ICD-10-CM | POA: Diagnosis present

## 2022-12-30 DIAGNOSIS — Z7951 Long term (current) use of inhaled steroids: Secondary | ICD-10-CM

## 2022-12-30 DIAGNOSIS — I10 Essential (primary) hypertension: Secondary | ICD-10-CM | POA: Diagnosis not present

## 2022-12-30 DIAGNOSIS — Z79899 Other long term (current) drug therapy: Secondary | ICD-10-CM

## 2022-12-30 DIAGNOSIS — F32A Depression, unspecified: Secondary | ICD-10-CM | POA: Diagnosis present

## 2022-12-30 DIAGNOSIS — R0600 Dyspnea, unspecified: Secondary | ICD-10-CM | POA: Diagnosis not present

## 2022-12-30 DIAGNOSIS — Z7984 Long term (current) use of oral hypoglycemic drugs: Secondary | ICD-10-CM

## 2022-12-30 DIAGNOSIS — Z1152 Encounter for screening for COVID-19: Secondary | ICD-10-CM

## 2022-12-30 DIAGNOSIS — Z91018 Allergy to other foods: Secondary | ICD-10-CM

## 2022-12-30 DIAGNOSIS — I5032 Chronic diastolic (congestive) heart failure: Secondary | ICD-10-CM | POA: Diagnosis present

## 2022-12-30 DIAGNOSIS — Z7982 Long term (current) use of aspirin: Secondary | ICD-10-CM

## 2022-12-30 DIAGNOSIS — Z91040 Latex allergy status: Secondary | ICD-10-CM

## 2022-12-30 DIAGNOSIS — E119 Type 2 diabetes mellitus without complications: Secondary | ICD-10-CM | POA: Diagnosis present

## 2022-12-30 LAB — CBC WITH DIFFERENTIAL/PLATELET
Abs Immature Granulocytes: 0.01 10*3/uL (ref 0.00–0.07)
Basophils Absolute: 0.1 10*3/uL (ref 0.0–0.1)
Basophils Relative: 1 %
Eosinophils Absolute: 0.3 10*3/uL (ref 0.0–0.5)
Eosinophils Relative: 5 %
HCT: 47.2 % — ABNORMAL HIGH (ref 36.0–46.0)
Hemoglobin: 14.6 g/dL (ref 12.0–15.0)
Immature Granulocytes: 0 %
Lymphocytes Relative: 30 %
Lymphs Abs: 2.1 10*3/uL (ref 0.7–4.0)
MCH: 27 pg (ref 26.0–34.0)
MCHC: 30.9 g/dL (ref 30.0–36.0)
MCV: 87.4 fL (ref 80.0–100.0)
Monocytes Absolute: 0.6 10*3/uL (ref 0.1–1.0)
Monocytes Relative: 8 %
Neutro Abs: 3.9 10*3/uL (ref 1.7–7.7)
Neutrophils Relative %: 56 %
Platelets: 352 10*3/uL (ref 150–400)
RBC: 5.4 MIL/uL — ABNORMAL HIGH (ref 3.87–5.11)
RDW: 16.3 % — ABNORMAL HIGH (ref 11.5–15.5)
WBC: 6.9 10*3/uL (ref 4.0–10.5)
nRBC: 0.3 % — ABNORMAL HIGH (ref 0.0–0.2)

## 2022-12-30 LAB — I-STAT VENOUS BLOOD GAS, ED
Acid-Base Excess: 6 mmol/L — ABNORMAL HIGH (ref 0.0–2.0)
Bicarbonate: 33.7 mmol/L — ABNORMAL HIGH (ref 20.0–28.0)
Calcium, Ion: 1.14 mmol/L — ABNORMAL LOW (ref 1.15–1.40)
HCT: 47 % — ABNORMAL HIGH (ref 36.0–46.0)
Hemoglobin: 16 g/dL — ABNORMAL HIGH (ref 12.0–15.0)
O2 Saturation: 97 %
Patient temperature: 37
Potassium: 4.6 mmol/L (ref 3.5–5.1)
Sodium: 143 mmol/L (ref 135–145)
TCO2: 36 mmol/L — ABNORMAL HIGH (ref 22–32)
pCO2, Ven: 62 mmHg — ABNORMAL HIGH (ref 44–60)
pH, Ven: 7.344 (ref 7.25–7.43)
pO2, Ven: 95 mmHg — ABNORMAL HIGH (ref 32–45)

## 2022-12-30 LAB — COMPREHENSIVE METABOLIC PANEL
ALT: 19 U/L (ref 0–44)
AST: 26 U/L (ref 15–41)
Albumin: 3.1 g/dL — ABNORMAL LOW (ref 3.5–5.0)
Alkaline Phosphatase: 61 U/L (ref 38–126)
Anion gap: 10 (ref 5–15)
BUN: 12 mg/dL (ref 6–20)
CO2: 27 mmol/L (ref 22–32)
Calcium: 8.7 mg/dL — ABNORMAL LOW (ref 8.9–10.3)
Chloride: 104 mmol/L (ref 98–111)
Creatinine, Ser: 0.95 mg/dL (ref 0.44–1.00)
GFR, Estimated: >60 mL/min (ref >60)
Glucose, Bld: 119 mg/dL — ABNORMAL HIGH (ref 70–99)
Potassium: 4.6 mmol/L (ref 3.5–5.1)
Sodium: 141 mmol/L (ref 135–145)
Total Bilirubin: 0.5 mg/dL (ref 0.3–1.2)
Total Protein: 6.3 g/dL — ABNORMAL LOW (ref 6.5–8.1)

## 2022-12-30 LAB — BRAIN NATRIURETIC PEPTIDE: B Natriuretic Peptide: 27.1 pg/mL (ref 0.0–100.0)

## 2022-12-30 LAB — RESP PANEL BY RT-PCR (RSV, FLU A&B, COVID)  RVPGX2
Influenza A by PCR: NEGATIVE
Influenza B by PCR: NEGATIVE
Resp Syncytial Virus by PCR: NEGATIVE
SARS Coronavirus 2 by RT PCR: NEGATIVE

## 2022-12-30 MED ORDER — IPRATROPIUM-ALBUTEROL 0.5-2.5 (3) MG/3ML IN SOLN
3.0000 mL | Freq: Once | RESPIRATORY_TRACT | Status: AC
  Administered 2022-12-30: 3 mL via RESPIRATORY_TRACT
  Filled 2022-12-30: qty 3

## 2022-12-30 MED ORDER — MAGNESIUM SULFATE 2 GM/50ML IV SOLN
2.0000 g | Freq: Once | INTRAVENOUS | Status: AC
  Administered 2022-12-30: 2 g via INTRAVENOUS
  Filled 2022-12-30: qty 50

## 2022-12-30 NOTE — ED Notes (Signed)
Pt provided with small amount of water with provider permission.  BiPAP placed back on pt at this time.

## 2022-12-30 NOTE — ED Notes (Signed)
Pt removed BiPAP at this time.  Pt requesting water before placing BiPAP back on.  This RN explained importance of BiPAP and told pt that he would have to ask the provider if she can have water.

## 2022-12-30 NOTE — ED Provider Notes (Signed)
[facility] Provider Note   CSN: [id number] Arrival date & time: 12/30/22  1857     History  Chief Complaint  Patient presents with   Respiratory Distress    Kathleen Rodriguez is a 57 y.o. female.  This is a 57 year old female with history of COPD and asthma, diabetes, hypertension, OSA, and anxiety presenting to the ED for shortness of breath.  Patient has had worsening shortness of breath over the last 3 days as well as a cough.  She has been using her home nebulizer which intermittently helps but did not help today.  She has not experienced any fevers, chest pain, abdominal pain nausea or vomiting.  She was brought in by EMS, given Atrovent and albuterol as well as 125 mg Solu-Medrol.      Home Medications Prior to Admission medications   Medication Sig Start Date End Date Taking? Authorizing Provider  albuterol (PROAIR HFA) 108 (90 Base) MCG/ACT inhaler TAKE 2 PUFFS BY MOUTH EVERY 6 HOURS AS NEEDED FOR WHEEZE OR SHORTNESS OF BREATH 08/30/22   Hosie Poisson, MD  aspirin EC 81 MG tablet Take 1 tablet (81 mg total) by mouth daily. Patient not taking: Reported on 08/31/2022 08/30/22   Hosie Poisson, MD  benzonatate (TESSALON) 200 MG capsule Take 1 capsule (200 mg total) by mouth 3 (three) times daily as needed for cough. Patient not taking: Reported on 08/31/2022 08/30/22   Hosie Poisson, MD  Blood Glucose Monitoring Suppl (ACCU-CHEK GUIDE ME) w/Device KIT Use to check blood sugar once daily. 05/04/21   [name], MD  Budeson-Glycopyrrol-Formoterol (BREZTRI AEROSPHERE) 160-9-4.8 MCG/ACT AERO Inhale 2 puffs into the lungs in the morning and at bedtime. 08/30/22   Hosie Poisson, MD  cloNIDine (CATAPRES) 0.2 MG tablet Take 1 tablet (0.2 mg total) by mouth 2 (two) times daily. For BP and hot flashes 01/18/22   Charlynne Cousins, MD  fluticasone [facility]) 50 MCG/ACT nasal spray Place 1 spray into both nostrils daily as needed for  allergies. Patient not taking: Reported on 08/31/2022 08/30/22   Hosie Poisson, MD  gabapentin (NEURONTIN) 300 MG capsule Take 1 capsule (300 mg total) by mouth 3 (three) times daily. Patient not taking: Reported on 08/31/2022 08/30/22   Hosie Poisson, MD  glucose blood (ACCU-CHEK GUIDE) test strip Use as instructed daily before breakfast. 10/05/21   [name], MD  ipratropium-albuterol (DUONEB) 0.5-2.5 (3) MG/3ML SOLN Inhale 3 mLs by nebulization every 6 (six) hours as needed. 08/30/22   Hosie Poisson, MD  loratadine (CLARITIN) 10 MG tablet Take 1 tablet (10 mg total) by mouth daily. 08/30/22   Hosie Poisson, MD  metFORMIN (GLUCOPHAGE) 500 MG tablet Take 1 tablet (500 mg total) by mouth 2 (two) times daily with a meal. Patient not taking: Reported on 08/31/2022 08/30/22   Hosie Poisson, MD  montelukast (SINGULAIR) 10 MG tablet Take 1 tablet (10 mg total) by mouth at bedtime. Patient not taking: Reported on 08/31/2022 08/30/22   Hosie Poisson, MD  pantoprazole (PROTONIX) 40 MG tablet Take 1 tablet (40 mg total) by mouth daily. Patient not taking: Reported on 08/31/2022 08/30/22   Hosie Poisson, MD      Allergies    Tomato, Latex, and Wool alcohol [lanolin]    Review of Systems   Review of Systems  Constitutional:  Negative for chills and fever.  Respiratory:  Positive for cough, shortness of breath and wheezing.   Cardiovascular:  Negative for chest pain.  Gastrointestinal:  Negative  for abdominal pain.  Genitourinary:  Negative for dysuria.  Neurological:  Negative for weakness.    Physical Exam Updated Vital Signs There were no vitals taken for this visit. Physical Exam Vitals and nursing note reviewed.  Constitutional:      General: She is in acute distress.     Appearance: She is ill-appearing.  HENT:     Head: Normocephalic.     Nose: Nose normal.     Mouth/Throat:     Mouth: Mucous membranes are moist.  Eyes:     Pupils: Pupils are equal, round, and reactive to light.   Cardiovascular:     Rate and Rhythm: Regular rhythm. Tachycardia present.     Pulses: Normal pulses.     Heart sounds: Normal heart sounds. No murmur heard.    No friction rub. No gallop.  Pulmonary:     Effort: Tachypnea, accessory muscle usage and respiratory distress present.     Breath sounds: Wheezing (Diffuse wheezing) present.  Abdominal:     General: There is no distension.     Palpations: Abdomen is soft.     Tenderness: There is no abdominal tenderness. There is no guarding or rebound.  Skin:    General: Skin is warm.     Capillary Refill: Capillary refill takes less than 2 seconds.  Neurological:     General: No focal deficit present.     Mental Status: She is alert and oriented to person, place, and time.     ED Results / Procedures / Treatments   Labs (all labs ordered are listed, but only abnormal results are displayed) Labs Reviewed - No data to display  EKG None  Radiology No results found.  Procedures Procedures    Medications Ordered in ED Medications - No data to display  ED Course/ Medical Decision Making/ A&P                             Medical Decision Making Presents in acute respiratory distress, history complicated by COPD and asthma.  She denies any fevers, has had some cough and improvement with treatments at home however these have not helped today.  Differential diagnosis includes acute asthma/COPD exacerbation, acute heart failure exacerbation, pneumonia, viral illness.  Patient was initially placed on BiPAP when she arrived due to her respiratory distress and lack of improvement with Atrovent, Solu-Medrol and DuoNebs with EMS.  Patient given 2 more DuoNebs and IV magnesium 2 g over 20 minutes.  Will obtain a chest x-ray to evaluate for focal consolidation.  VBG ordered to evaluate for hypercarbia, CBC, CMP, BNP and EKG.  I personally reviewed and interpreted patient's labs, pH normal 7.34, CO2 elevated at 62, with normal pH this is likely  chronic although slightly above her normal, CMP shows no anion gap acidosis, electrolytes otherwise normal.  BNP normal at 27.1, no leukocytosis and no left shift.  Viral testing pending.  I personally reviewed and interpreted patient's chest x-ray which shows no focal consolidation, cardiac silhouette within normal limits without pulmonary edema or pleural effusion.    I personally reviewed and interpreted patient's EKG which shows sinus tachycardia with rate 105, no acute ischemic changes when compared to prior.  Upon my reevaluation patient is much more comfortable on BiPAP.  She is moving air well bilaterally and her wheezing has improved.  Patient's work of breathing has also markedly improved.  Called and discussed this case with the hospitalist team who  agreed to admit patient to their service for further management.  Patient was stable upon admission to hospital.  Problems Addressed: COPD exacerbation ([place]): acute illness or injury that poses a threat to life or bodily functions Severe asthma with exacerbation, unspecified whether persistent: acute illness or injury that poses a threat to life or bodily functions  Amount and/or Complexity of Data Reviewed Labs: ordered. Decision-making details documented in ED Course. Radiology: ordered and independent interpretation performed. Decision-making details documented in ED Course. ECG/medicine tests: ordered and independent interpretation performed. Decision-making details documented in ED Course.  Risk Prescription drug management. Decision regarding hospitalization.          Final Clinical Impression(s) / ED Diagnoses Final diagnoses:  None    Rx / DC Orders ED Discharge Orders     None         Jimmie Molly, MD 12/30/22 2109    Isla Pence, MD 12/30/22 2152

## 2022-12-30 NOTE — ED Triage Notes (Signed)
Patient arrived via EMS from home with complaints of worsening SOB x3 days, states tried home neb. but no relief, Hx of asthma and smoking. Patient speaks in incomplete sentences at arrival to ED due shortness of breath. EMS given '10mg'$  albuterol, 0.'5mg'$  atrovent, and '125mg'$  solumedrol. Per EMS [phone].

## 2022-12-31 ENCOUNTER — Other Ambulatory Visit: Payer: Self-pay

## 2022-12-31 ENCOUNTER — Encounter (HOSPITAL_COMMUNITY): Payer: Self-pay | Admitting: Internal Medicine

## 2022-12-31 DIAGNOSIS — Z79899 Other long term (current) drug therapy: Secondary | ICD-10-CM | POA: Diagnosis not present

## 2022-12-31 DIAGNOSIS — Z91040 Latex allergy status: Secondary | ICD-10-CM | POA: Diagnosis not present

## 2022-12-31 DIAGNOSIS — F32A Depression, unspecified: Secondary | ICD-10-CM | POA: Diagnosis not present

## 2022-12-31 DIAGNOSIS — F419 Anxiety disorder, unspecified: Secondary | ICD-10-CM | POA: Diagnosis not present

## 2022-12-31 DIAGNOSIS — Z1152 Encounter for screening for COVID-19: Secondary | ICD-10-CM | POA: Diagnosis not present

## 2022-12-31 DIAGNOSIS — J441 Chronic obstructive pulmonary disease with (acute) exacerbation: Secondary | ICD-10-CM

## 2022-12-31 DIAGNOSIS — J9601 Acute respiratory failure with hypoxia: Secondary | ICD-10-CM | POA: Diagnosis not present

## 2022-12-31 DIAGNOSIS — Z809 Family history of malignant neoplasm, unspecified: Secondary | ICD-10-CM | POA: Diagnosis not present

## 2022-12-31 DIAGNOSIS — Z87891 Personal history of nicotine dependence: Secondary | ICD-10-CM | POA: Diagnosis not present

## 2022-12-31 DIAGNOSIS — Z91048 Other nonmedicinal substance allergy status: Secondary | ICD-10-CM | POA: Diagnosis not present

## 2022-12-31 DIAGNOSIS — R0600 Dyspnea, unspecified: Secondary | ICD-10-CM | POA: Diagnosis not present

## 2022-12-31 DIAGNOSIS — J9602 Acute respiratory failure with hypercapnia: Secondary | ICD-10-CM | POA: Diagnosis not present

## 2022-12-31 DIAGNOSIS — I5032 Chronic diastolic (congestive) heart failure: Secondary | ICD-10-CM | POA: Diagnosis not present

## 2022-12-31 DIAGNOSIS — E669 Obesity, unspecified: Secondary | ICD-10-CM | POA: Diagnosis not present

## 2022-12-31 DIAGNOSIS — I110 Hypertensive heart disease with heart failure: Secondary | ICD-10-CM | POA: Diagnosis not present

## 2022-12-31 DIAGNOSIS — Z96652 Presence of left artificial knee joint: Secondary | ICD-10-CM | POA: Diagnosis not present

## 2022-12-31 DIAGNOSIS — Z7982 Long term (current) use of aspirin: Secondary | ICD-10-CM | POA: Diagnosis not present

## 2022-12-31 DIAGNOSIS — Z91018 Allergy to other foods: Secondary | ICD-10-CM | POA: Diagnosis not present

## 2022-12-31 DIAGNOSIS — E119 Type 2 diabetes mellitus without complications: Secondary | ICD-10-CM | POA: Diagnosis not present

## 2022-12-31 DIAGNOSIS — Z830 Family history of human immunodeficiency virus [HIV] disease: Secondary | ICD-10-CM | POA: Diagnosis not present

## 2022-12-31 DIAGNOSIS — K219 Gastro-esophageal reflux disease without esophagitis: Secondary | ICD-10-CM | POA: Diagnosis not present

## 2022-12-31 DIAGNOSIS — Z825 Family history of asthma and other chronic lower respiratory diseases: Secondary | ICD-10-CM | POA: Diagnosis not present

## 2022-12-31 DIAGNOSIS — G4733 Obstructive sleep apnea (adult) (pediatric): Secondary | ICD-10-CM | POA: Diagnosis not present

## 2022-12-31 DIAGNOSIS — J45901 Unspecified asthma with (acute) exacerbation: Secondary | ICD-10-CM | POA: Diagnosis not present

## 2022-12-31 DIAGNOSIS — Z8249 Family history of ischemic heart disease and other diseases of the circulatory system: Secondary | ICD-10-CM | POA: Diagnosis not present

## 2022-12-31 LAB — CBC
HCT: 44.9 % (ref 36.0–46.0)
Hemoglobin: 14.5 g/dL (ref 12.0–15.0)
MCH: 28 pg (ref 26.0–34.0)
MCHC: 32.3 g/dL (ref 30.0–36.0)
MCV: 86.7 fL (ref 80.0–100.0)
Platelets: 268 10*3/uL (ref 150–400)
RBC: 5.18 MIL/uL — ABNORMAL HIGH (ref 3.87–5.11)
RDW: 16.2 % — ABNORMAL HIGH (ref 11.5–15.5)
WBC: 6.4 10*3/uL (ref 4.0–10.5)
nRBC: 0.3 % — ABNORMAL HIGH (ref 0.0–0.2)

## 2022-12-31 LAB — I-STAT VENOUS BLOOD GAS, ED
Acid-Base Excess: 3 mmol/L — ABNORMAL HIGH (ref 0.0–2.0)
Bicarbonate: 29.7 mmol/L — ABNORMAL HIGH (ref 20.0–28.0)
Calcium, Ion: 1.15 mmol/L (ref 1.15–1.40)
HCT: 46 % (ref 36.0–46.0)
Hemoglobin: 15.6 g/dL — ABNORMAL HIGH (ref 12.0–15.0)
O2 Saturation: 67 %
Potassium: 4.6 mmol/L (ref 3.5–5.1)
Sodium: 142 mmol/L (ref 135–145)
TCO2: 31 mmol/L (ref 22–32)
pCO2, Ven: 54.1 mmHg (ref 44–60)
pH, Ven: 7.347 (ref 7.25–7.43)
pO2, Ven: 37 mmHg (ref 32–45)

## 2022-12-31 LAB — GLUCOSE, CAPILLARY
Glucose-Capillary: 150 mg/dL — ABNORMAL HIGH (ref 70–99)
Glucose-Capillary: 97 mg/dL (ref 70–99)
Glucose-Capillary: 158 mg/dL — ABNORMAL HIGH (ref 70–99)

## 2022-12-31 LAB — BASIC METABOLIC PANEL
Anion gap: 5 (ref 5–15)
BUN: 16 mg/dL (ref 6–20)
CO2: 30 mmol/L (ref 22–32)
Calcium: 8.7 mg/dL — ABNORMAL LOW (ref 8.9–10.3)
Chloride: 106 mmol/L (ref 98–111)
Creatinine, Ser: 0.94 mg/dL (ref 0.44–1.00)
GFR, Estimated: >60 mL/min (ref >60)
Glucose, Bld: 145 mg/dL — ABNORMAL HIGH (ref 70–99)
Potassium: 4.7 mmol/L (ref 3.5–5.1)
Sodium: 141 mmol/L (ref 135–145)

## 2022-12-31 LAB — MAGNESIUM: Magnesium: 2.5 mg/dL — ABNORMAL HIGH (ref 1.7–2.4)

## 2022-12-31 LAB — CBG MONITORING, ED: Glucose-Capillary: 174 mg/dL — ABNORMAL HIGH (ref 70–99)

## 2022-12-31 LAB — TROPONIN I (HIGH SENSITIVITY)
Troponin I (High Sensitivity): 11 ng/L (ref <18)
Troponin I (High Sensitivity): 9 ng/L (ref <18)

## 2022-12-31 MED ORDER — INSULIN ASPART 100 UNIT/ML IJ SOLN
0.0000 [IU] | Freq: Three times a day (TID) | INTRAMUSCULAR | Status: DC
  Administered 2022-12-31 – 2023-01-01 (×2): 1 [IU] via SUBCUTANEOUS
  Administered 2023-01-01: 2 [IU] via SUBCUTANEOUS

## 2022-12-31 MED ORDER — METHYLPREDNISOLONE SODIUM SUCC 40 MG IJ SOLR
40.0000 mg | Freq: Two times a day (BID) | INTRAMUSCULAR | Status: DC
  Administered 2022-12-31 – 2023-01-02 (×4): 40 mg via INTRAVENOUS
  Filled 2022-12-31 (×4): qty 1

## 2022-12-31 MED ORDER — PROCHLORPERAZINE EDISYLATE 10 MG/2ML IJ SOLN: 5.0000 mg | Freq: Four times a day (QID) | INTRAMUSCULAR | Status: DC | PRN

## 2022-12-31 MED ORDER — FLUCONAZOLE 150 MG PO TABS
150.0000 mg | ORAL_TABLET | Freq: Once | ORAL | Status: AC
  Administered 2022-12-31: 150 mg via ORAL
  Filled 2022-12-31: qty 1

## 2022-12-31 MED ORDER — SALINE SPRAY 0.65 % NA SOLN
1.0000 | NASAL | Status: DC | PRN
  Administered 2023-01-01: 1 via NASAL
  Filled 2022-12-31: qty 44

## 2022-12-31 MED ORDER — HYDRALAZINE HCL 20 MG/ML IJ SOLN
5.0000 mg | Freq: Four times a day (QID) | INTRAMUSCULAR | Status: DC | PRN
  Administered 2022-12-31: 5 mg via INTRAVENOUS
  Filled 2022-12-31: qty 1

## 2022-12-31 MED ORDER — MONTELUKAST SODIUM 10 MG PO TABS
10.0000 mg | ORAL_TABLET | Freq: Every day | ORAL | Status: DC
  Administered 2022-12-31 – 2023-01-01 (×3): 10 mg via ORAL
  Filled 2022-12-31 (×3): qty 1

## 2022-12-31 MED ORDER — AZITHROMYCIN 500 MG PO TABS
250.0000 mg | ORAL_TABLET | Freq: Every day | ORAL | Status: DC
  Administered 2023-01-01 – 2023-01-02 (×2): 250 mg via ORAL
  Filled 2022-12-31 (×3): qty 1

## 2022-12-31 MED ORDER — BUDESONIDE 0.25 MG/2ML IN SUSP
RESPIRATORY_TRACT | Status: AC
  Filled 2022-12-31: qty 2

## 2022-12-31 MED ORDER — IPRATROPIUM-ALBUTEROL 0.5-2.5 (3) MG/3ML IN SOLN
3.0000 mL | Freq: Four times a day (QID) | RESPIRATORY_TRACT | Status: DC
  Administered 2022-12-31 – 2023-01-02 (×9): 3 mL via RESPIRATORY_TRACT
  Filled 2022-12-31 (×8): qty 3

## 2022-12-31 MED ORDER — ACETAMINOPHEN 325 MG PO TABS: 650.0000 mg | ORAL_TABLET | Freq: Four times a day (QID) | ORAL | Status: DC | PRN

## 2022-12-31 MED ORDER — POLYETHYLENE GLYCOL 3350 17 G PO PACK: 17.0000 g | PACK | Freq: Every day | ORAL | Status: DC | PRN

## 2022-12-31 MED ORDER — AZITHROMYCIN 250 MG PO TABS
500.0000 mg | ORAL_TABLET | Freq: Every day | ORAL | Status: AC
  Administered 2022-12-31: 500 mg via ORAL
  Filled 2022-12-31: qty 2

## 2022-12-31 MED ORDER — INSULIN ASPART 100 UNIT/ML IJ SOLN: 0.0000 [IU] | Freq: Every day | INTRAMUSCULAR | Status: DC

## 2022-12-31 MED ORDER — ORAL CARE MOUTH RINSE: 15.0000 mL | OROMUCOSAL | Status: DC | PRN

## 2022-12-31 MED ORDER — METHYLPREDNISOLONE SODIUM SUCC 125 MG IJ SOLR
80.0000 mg | Freq: Every day | INTRAMUSCULAR | Status: DC
  Administered 2022-12-31: 80 mg via INTRAVENOUS
  Filled 2022-12-31: qty 2

## 2022-12-31 MED ORDER — GABAPENTIN 100 MG PO CAPS
200.0000 mg | ORAL_CAPSULE | ORAL | Status: AC
  Administered 2022-12-31: 200 mg via ORAL
  Filled 2022-12-31: qty 2

## 2022-12-31 MED ORDER — CLONIDINE HCL 0.2 MG PO TABS
0.2000 mg | ORAL_TABLET | Freq: Two times a day (BID) | ORAL | Status: DC
  Administered 2022-12-31 – 2023-01-02 (×5): 0.2 mg via ORAL
  Filled 2022-12-31 (×5): qty 1

## 2022-12-31 MED ORDER — ASPIRIN 81 MG PO TBEC
81.0000 mg | DELAYED_RELEASE_TABLET | Freq: Every day | ORAL | Status: DC
  Administered 2022-12-31 – 2023-01-02 (×3): 81 mg via ORAL
  Filled 2022-12-31 (×3): qty 1

## 2022-12-31 MED ORDER — ALBUTEROL SULFATE (2.5 MG/3ML) 0.083% IN NEBU
2.5000 mg | INHALATION_SOLUTION | RESPIRATORY_TRACT | Status: DC | PRN
  Administered 2022-12-31: 2.5 mg via RESPIRATORY_TRACT
  Filled 2022-12-31: qty 3

## 2022-12-31 MED ORDER — ENOXAPARIN SODIUM 40 MG/0.4ML IJ SOSY
40.0000 mg | PREFILLED_SYRINGE | Freq: Every day | INTRAMUSCULAR | Status: DC
  Administered 2022-12-31 – 2023-01-02 (×3): 40 mg via SUBCUTANEOUS
  Filled 2022-12-31 (×3): qty 0.4

## 2022-12-31 NOTE — Progress Notes (Signed)
PROGRESS NOTE    Kathleen Rodriguez  [id number] DOB: 07-Jan-1966 DOA: 12/30/2022 PCP: [name], MD  Outpatient Specialists:     Brief Narrative:  Patient is a 57 year old female past medical history significant for asthma, COPD, OSA on CPAP, chronic diastolic CHF, history of cocaine use, history of critical illness myopathy, type 2 diabetes mellitus, hypertension and GERD.  Patient was admitted with progressive dyspnea.   Assessment & Plan:   Principal Problem:   COPD with acute exacerbation ([place])   COPD with acute exacerbation/asthma with acute exacerbation Received IV Solu-Medrol via EMS, DuoNebs, and magnesium Continue DuoNebs, IV Solu-Medrol As needed antitussives Incentive spirometer Add Z-Pak x 5 days Home O2 evaluation prior to DC. 12/31/2022: Continue IV Solu-Medrol, DuoNeb and montelukast.   Acute hypoxic and hypercarbic respiratory failure Currently on BiPAP Repeat VBG in the morning Wean off O2 supplementation as tolerated Home O2 evaluation on 12/31/2022. 12/31/2022: Patient is off BiPAP.  Continue to optimize respiratory management.   Hypertension BP is not at goal, elevated Not on oral antihypertensives IV antihypertensives as needed with parameters. Closely monitor vital signs 12/31/2022: Continue to optimize blood pressure control.   Obesity Recommend weight loss outpatient with regular physical activity and healthy dieting.   DVT prophylaxis: Subcutaneous Lovenox Code Status: Full code Family Communication:  Disposition Plan: Home eventually   Consultants:  None  Procedures:  None  Antimicrobials:  Azithromycin   Subjective: Shortness of breath and wheezing  Objective: Vitals:   12/31/22 0900 12/31/22 0930 12/31/22 1000 12/31/22 1030  BP: (!) 136/123 104/74 133/86 (!) 141/84  Pulse: (!) 118 (!) 101 (!) 107 (!) 101  Resp: (!) 32 20 (!) 24 (!) 27  Temp:      TempSrc:      SpO2: 99% 95% 98% 98%    Intake/Output Summary (Last 24  hours) at 12/31/2022 1107 Last data filed at 12/30/2022 2014 Gross per 24 hour  Intake 44.13 ml  Output --  Net 44.13 ml   There were no vitals filed for this visit.  Examination:  General exam: Appears calm and comfortable  Respiratory system: Decreased air entry.  Inspiratory and expiratory wheeze.   Cardiovascular system: S1 & S2 heard Gastrointestinal system: Abdomen is obese, soft and nontender.   Central nervous system: Alert and oriented. No focal neurological deficits.     Data Reviewed: I have personally reviewed following labs and imaging studies  CBC: Recent Labs  Lab 12/30/22 1901 12/30/22 2002 12/31/22 0105 12/31/22 0111  WBC 6.9  --  6.4  --   NEUTROABS 3.9  --   --   --   HGB 14.6 16.0* 14.5 15.6*  HCT 47.2* 47.0* 44.9 46.0  MCV 87.4  --  86.7  --   PLT 352  --  268  --    Basic Metabolic Panel: Recent Labs  Lab 12/30/22 1901 12/30/22 2002 12/31/22 0105 12/31/22 0111  NA 141 143 141 142  K 4.6 4.6 4.7 4.6  CL 104  --  106  --   CO2 27  --  30  --   GLUCOSE 119*  --  145*  --   BUN 12  --  16  --   CREATININE 0.95  --  0.94  --   CALCIUM 8.7*  --  8.7*  --   MG  --   --  2.5*  --    GFR: CrCl cannot be calculated (Unknown ideal weight.). Liver Function Tests: Recent Labs  Lab 12/30/22  1901  AST 26  ALT 19  ALKPHOS 61  BILITOT 0.5  PROT 6.3*  ALBUMIN 3.1*   No results for input(s): "LIPASE", "AMYLASE" in the last 168 hours. No results for input(s): "AMMONIA" in the last 168 hours. Coagulation Profile: No results for input(s): "INR", "PROTIME" in the last 168 hours. Cardiac Enzymes: No results for input(s): "CKTOTAL", "CKMB", "CKMBINDEX", "TROPONINI" in the last 168 hours. BNP (last 3 results) No results for input(s): "PROBNP" in the last 8760 hours. HbA1C: No results for input(s): "HGBA1C" in the last 72 hours. CBG: Recent Labs  Lab 12/31/22 0750  GLUCAP 174*   Lipid Profile: No results for input(s): "CHOL", "HDL", "LDLCALC",  "TRIG", "CHOLHDL", "LDLDIRECT" in the last 72 hours. Thyroid Function Tests: No results for input(s): "TSH", "T4TOTAL", "FREET4", "T3FREE", "THYROIDAB" in the last 72 hours. Anemia Panel: No results for input(s): "VITAMINB12", "FOLATE", "FERRITIN", "TIBC", "IRON", "RETICCTPCT" in the last 72 hours. Urine analysis:    Component Value Date/Time   COLORURINE YELLOW 05/29/2022 2230   APPEARANCEUR CLEAR 05/29/2022 2230   LABSPEC 1.023 05/29/2022 2230   PHURINE 5.0 05/29/2022 2230   GLUCOSEU NEGATIVE 05/29/2022 2230   GLUCOSEU NEGATIVE 02/10/2022 1130   HGBUR SMALL (A) 05/29/2022 2230   BILIRUBINUR NEGATIVE 05/29/2022 2230   BILIRUBINUR small 05/26/2017 1539   KETONESUR NEGATIVE 05/29/2022 2230   PROTEINUR NEGATIVE 05/29/2022 2230   UROBILINOGEN 0.2 02/10/2022 1130   NITRITE NEGATIVE 05/29/2022 2230   LEUKOCYTESUR NEGATIVE 05/29/2022 2230   Sepsis Labs: '@LABRCNTIP'$ (procalcitonin:4,lacticidven:4)  ) Recent Results (from the past 240 hour(s))  Resp panel by RT-PCR (RSV, Flu A&B, Covid) Anterior Nasal Swab     Status: None   Collection Time: 12/30/22  8:17 PM   Specimen: Anterior Nasal Swab  Result Value Ref Range Status   SARS Coronavirus 2 by RT PCR NEGATIVE NEGATIVE Final   Influenza A by PCR NEGATIVE NEGATIVE Final   Influenza B by PCR NEGATIVE NEGATIVE Final    Comment: (NOTE) The Xpert Xpress SARS-CoV-2/FLU/RSV plus assay is intended as an aid in the diagnosis of influenza from Nasopharyngeal swab specimens and should not be used as a sole basis for treatment. Nasal washings and aspirates are unacceptable for Xpert Xpress SARS-CoV-2/FLU/RSV testing.  Fact Sheet for Patients: EntrepreneurPulse.com.au  Fact Sheet for Healthcare Providers: IncredibleEmployment.be  This test is not yet approved or cleared by the [place] FDA and has been authorized for detection and/or diagnosis of SARS-CoV-2 by FDA under an Emergency Use Authorization  (EUA). This EUA will remain in effect (meaning this test can be used) for the duration of the COVID-19 declaration under Section 564(b)(1) of the Act, 21 U.S.C. section 360bbb-3(b)(1), unless the authorization is terminated or revoked.     Resp Syncytial Virus by PCR NEGATIVE NEGATIVE Final    Comment: (NOTE) Fact Sheet for Patients: EntrepreneurPulse.com.au  Fact Sheet for Healthcare Providers: IncredibleEmployment.be  This test is not yet approved or cleared by the [place] FDA and has been authorized for detection and/or diagnosis of SARS-CoV-2 by FDA under an Emergency Use Authorization (EUA). This EUA will remain in effect (meaning this test can be used) for the duration of the COVID-19 declaration under Section 564(b)(1) of the Act, 21 U.S.C. section 360bbb-3(b)(1), unless the authorization is terminated or revoked.  Performed at [facility], [place] [address], [place],  [address]          Radiology Studies: DG Chest Portable 1 View  Result Date: 12/30/2022 CLINICAL DATA:  Dyspnea EXAM: PORTABLE CHEST  1 VIEW COMPARISON:  Chest x-ray 08/27/2022 FINDINGS: The heart size and mediastinal contours are within normal limits. Both lungs are clear. The visualized skeletal structures are unremarkable. IMPRESSION: No active disease. Electronically Signed   By: Ronney Asters M.D.   On: 12/30/2022 19:21        Scheduled Meds:  aspirin EC  81 mg Oral Daily   [START ON 01/01/2023] azithromycin  250 mg Oral Daily   cloNIDine  0.2 mg Oral BID   enoxaparin (LOVENOX) injection  40 mg Subcutaneous Daily   insulin aspart  0-5 Units Subcutaneous QHS   insulin aspart  0-9 Units Subcutaneous TID WC   ipratropium-albuterol  3 mL Nebulization Q6H   methylPREDNISolone (SOLU-MEDROL) injection  80 mg Intravenous Daily   montelukast  10 mg Oral QHS   Continuous Infusions:   LOS: 0 days    Time spent: 55 minutes.    Dana Allan, MD  [facility] Pager #: [phone] 7PM-7AM contact night coverage as above

## 2022-12-31 NOTE — ED Notes (Signed)
ED TO INPATIENT HANDOFF REPORT  ED Nurse Name and Phone #: [phone]  S Name/Age/Gender [name] 57 y.o. female Room/Bed: 003C/003C  Code Status   Code Status: Full Code  Home/SNF/Other Home Patient oriented to: self, place, time, and situation Is this baseline? Yes   Triage Complete: Triage complete  Chief Complaint COPD with acute exacerbation [facility]) [J44.1]  Triage Note Patient arrived via EMS from home with complaints of worsening SOB x3 days, states tried home neb. but no relief, Hx of asthma and smoking. Patient speaks in incomplete sentences at arrival to ED due shortness of breath. EMS given '10mg'$  albuterol, 0.'5mg'$  atrovent, and '125mg'$  solumedrol. Per EMS [phone].     Allergies Allergies  Allergen Reactions   Tomato Hives, Itching and Other (See Comments)    ALSO REACTS TO KETCHUP   Latex Itching and Rash   Wool Alcohol [Lanolin] Itching    Level of Care/Admitting Diagnosis ED Disposition     ED Disposition  Admit   Condition  --   Comment  Hospital Area: [place] [[id number]]  Level of Care: Progressive [102]  Admit to Progressive based on following criteria: RESPIRATORY PROBLEMS hypoxemic/hypercapnic respiratory failure that is responsive to NIPPV (BiPAP) or High Flow Nasal Cannula (6-80 lpm). Frequent assessment/intervention, no > Q2 hrs < Q4 hrs, to maintain oxygenation and pulmonary hygiene.  May place patient in observation at [facility] or [place] if equivalent level of care is available:: Yes  Covid Evaluation: Asymptomatic - no recent exposure (last 10 days) testing not required  Diagnosis: COPD with acute exacerbation [facility]) [id number]  Admitting Physician: Kayleen Memos [id number]  Attending Physician: Kayleen Memos [id number]          B Medical/Surgery History Past Medical History:  Diagnosis Date   Arthritis    "knees, lower back; legs, ankles" (01/27/2016)   Asthma    followed by Dr. Halford Chessman   Asthma    CHF  (congestive heart failure) ([place]) 2016   "when I went into a coma"   Cocaine abuse ([place])    Critical illness myopathy 01/2013   Diabetes mellitus without complication ([place])    GERD (gastroesophageal reflux disease)    Hypertension    "doctor took me off RX in 2016" (01/27/2016)   Hypertension    Influenza B 0000000   Complicated by multi-organ failure   OSA on CPAP "since " 03/20/2013   Pneumonia 2016   Required emergent intubation    asthma exacerbation in 2016   Skin ulcer ([place]) secondary to bullous impetigo /trauma 08/29/2018   Sleep apnea    Tobacco abuse    Upper airway cough syndrome    Past Surgical History:  Procedure Laterality Date   BREAST SURGERY Right 1980   as a teenager , cyst was benign   CESAREAN SECTION  2006   LACERATION REPAIR Right ~ 1997   "tried to cut myself"   TOTAL KNEE ARTHROPLASTY Left 09/01/2016   Procedure: LEFT TOTAL KNEE ARTHROPLASTY;  Surgeon: Leandrew Koyanagi, MD;  Location: [place];  Service: Orthopedics;  Laterality: Left;   TUBAL LIGATION  2006     A IV Location/Drains/Wounds Patient Lines/Drains/Airways Status     Active Line/Drains/Airways     Name Placement date Placement time Site Days   Peripheral IV 12/30/22 20 G Posterior;Right Wrist 12/30/22  --  Wrist  1            Intake/Output Last 24 hours  Intake/Output Summary (Last 24  hours) at 12/31/2022 1030 Last data filed at 12/30/2022 2014 Gross per 24 hour  Intake 44.13 ml  Output --  Net 44.13 ml    Labs/Imaging Results for orders placed or performed during the hospital encounter of 12/30/22 (from the past 48 hour(s))  CBC with Differential     Status: Abnormal   Collection Time: 12/30/22  7:01 PM  Result Value Ref Range   WBC 6.9 4.0 - 10.5 K/uL   RBC 5.40 (H) 3.87 - 5.11 MIL/uL   Hemoglobin 14.6 12.0 - 15.0 g/dL   HCT 47.2 (H) 36.0 - 46.0 %   MCV 87.4 80.0 - 100.0 fL   MCH 27.0 26.0 - 34.0 pg   MCHC 30.9 30.0 - 36.0 g/dL   RDW 16.3 (H) 11.5 - 15.5 %   Platelets 352 150  - 400 K/uL    Comment: REPEATED TO VERIFY   nRBC 0.3 (H) 0.0 - 0.2 %   Neutrophils Relative % 56 %   Neutro Abs 3.9 1.7 - 7.7 K/uL   Lymphocytes Relative 30 %   Lymphs Abs 2.1 0.7 - 4.0 K/uL   Monocytes Relative 8 %   Monocytes Absolute 0.6 0.1 - 1.0 K/uL   Eosinophils Relative 5 %   Eosinophils Absolute 0.3 0.0 - 0.5 K/uL   Basophils Relative 1 %   Basophils Absolute 0.1 0.0 - 0.1 K/uL   Immature Granulocytes 0 %   Abs Immature Granulocytes 0.01 0.00 - 0.07 K/uL    Comment: Performed at [facility], [address], [place], [place] [address]  Comprehensive metabolic panel     Status: Abnormal   Collection Time: 12/30/22  7:01 PM  Result Value Ref Range   Sodium 141 135 - 145 mmol/L   Potassium 4.6 3.5 - 5.1 mmol/L    Comment: HEMOLYSIS AT THIS LEVEL MAY AFFECT RESULT   Chloride 104 98 - 111 mmol/L   CO2 27 22 - 32 mmol/L   Glucose, Bld 119 (H) 70 - 99 mg/dL    Comment: Glucose reference range applies only to samples taken after fasting for at least 8 hours.   BUN 12 6 - 20 mg/dL   Creatinine, Ser 0.95 0.44 - 1.00 mg/dL   Calcium 8.7 (L) 8.9 - 10.3 mg/dL   Total Protein 6.3 (L) 6.5 - 8.1 g/dL   Albumin 3.1 (L) 3.5 - 5.0 g/dL   AST 26 15 - 41 U/L    Comment: HEMOLYSIS AT THIS LEVEL MAY AFFECT RESULT   ALT 19 0 - 44 U/L    Comment: HEMOLYSIS AT THIS LEVEL MAY AFFECT RESULT   Alkaline Phosphatase 61 38 - 126 U/L   Total Bilirubin 0.5 0.3 - 1.2 mg/dL    Comment: HEMOLYSIS AT THIS LEVEL MAY AFFECT RESULT   GFR, Estimated >60 >60 mL/min    Comment: (NOTE) Calculated using the CKD-EPI Creatinine Equation (2021)    Anion gap 10 5 - 15    Comment: Performed at [facility], [place] [address] [place],  [address]  Brain natriuretic peptide     Status: None   Collection Time: 12/30/22  7:01 PM  Result Value Ref Range   B Natriuretic Peptide 27.1 0.0 - 100.0 pg/mL    Comment: Performed at [facility] [address], [place],  [address]  I-Stat  venous blood gas, [facility], [facility], [facility])     Status: Abnormal   Collection Time: 12/30/22  8:02 PM  Result Value Ref  Range   pH, Ven 7.344 7.25 - 7.43   pCO2, Ven 62.0 (H) 44 - 60 mmHg   pO2, Ven 95 (H) 32 - 45 mmHg   Bicarbonate 33.7 (H) 20.0 - 28.0 mmol/L   TCO2 36 (H) 22 - 32 mmol/L   O2 Saturation 97 %   Acid-Base Excess 6.0 (H) 0.0 - 2.0 mmol/L   Sodium 143 135 - 145 mmol/L   Potassium 4.6 3.5 - 5.1 mmol/L   Calcium, Ion 1.14 (L) 1.15 - 1.40 mmol/L   HCT 47.0 (H) 36.0 - 46.0 %   Hemoglobin 16.0 (H) 12.0 - 15.0 g/dL   Patient temperature 37.0 C    Sample type VENOUS   Resp panel by RT-PCR (RSV, Flu A&B, Covid) Anterior Nasal Swab     Status: None   Collection Time: 12/30/22  8:17 PM   Specimen: Anterior Nasal Swab  Result Value Ref Range   SARS Coronavirus 2 by RT PCR NEGATIVE NEGATIVE   Influenza A by PCR NEGATIVE NEGATIVE   Influenza B by PCR NEGATIVE NEGATIVE    Comment: (NOTE) The Xpert Xpress SARS-CoV-2/FLU/RSV plus assay is intended as an aid in the diagnosis of influenza from Nasopharyngeal swab specimens and should not be used as a sole basis for treatment. Nasal washings and aspirates are unacceptable for Xpert Xpress SARS-CoV-2/FLU/RSV testing.  Fact Sheet for Patients: EntrepreneurPulse.com.au  Fact Sheet for Healthcare Providers: IncredibleEmployment.be  This test is not yet approved or cleared by the [place] FDA and has been authorized for detection and/or diagnosis of SARS-CoV-2 by FDA under an Emergency Use Authorization (EUA). This EUA will remain in effect (meaning this test can be used) for the duration of the COVID-19 declaration under Section 564(b)(1) of the Act, 21 U.S.C. section 360bbb-3(b)(1), unless the authorization is terminated or revoked.     Resp Syncytial Virus by PCR NEGATIVE NEGATIVE    Comment: (NOTE) Fact Sheet for Patients: EntrepreneurPulse.com.au  Fact Sheet for Healthcare  Providers: IncredibleEmployment.be  This test is not yet approved or cleared by the [place] FDA and has been authorized for detection and/or diagnosis of SARS-CoV-2 by FDA under an Emergency Use Authorization (EUA). This EUA will remain in effect (meaning this test can be used) for the duration of the COVID-19 declaration under Section 564(b)(1) of the Act, 21 U.S.C. section 360bbb-3(b)(1), unless the authorization is terminated or revoked.  Performed at [facility], [place] [address] [place], [place] [address]   CBC     Status: Abnormal   Collection Time: 12/31/22  1:05 AM  Result Value Ref Range   WBC 6.4 4.0 - 10.5 K/uL   RBC 5.18 (H) 3.87 - 5.11 MIL/uL   Hemoglobin 14.5 12.0 - 15.0 g/dL   HCT 44.9 36.0 - 46.0 %   MCV 86.7 80.0 - 100.0 fL   MCH 28.0 26.0 - 34.0 pg   MCHC 32.3 30.0 - 36.0 g/dL   RDW 16.2 (H) 11.5 - 15.5 %   Platelets 268 150 - 400 K/uL   nRBC 0.3 (H) 0.0 - 0.2 %    Comment: Performed at [facility], [place] [address], [place], [place] [id number]  Basic metabolic panel     Status: Abnormal   Collection Time: 12/31/22  1:05 AM  Result Value Ref Range   Sodium 141 135 - 145 mmol/L   Potassium 4.7 3.5 - 5.1 mmol/L   Chloride 106 98 - 111 mmol/L   CO2 30 22 - 32 mmol/L  Glucose, Bld 145 (H) 70 - 99 mg/dL    Comment: Glucose reference range applies only to samples taken after fasting for at least 8 hours.   BUN 16 6 - 20 mg/dL   Creatinine, Ser 0.94 0.44 - 1.00 mg/dL   Calcium 8.7 (L) 8.9 - 10.3 mg/dL   GFR, Estimated >60 >60 mL/min    Comment: (NOTE) Calculated using the CKD-EPI Creatinine Equation (2021)    Anion gap 5 5 - 15    Comment: Performed at [facility] [address], [place], [place] [address]  Magnesium     Status: Abnormal   Collection Time: 12/31/22  1:05 AM  Result Value Ref Range   Magnesium 2.5 (H) 1.7 - 2.4 mg/dL    Comment: Performed at [facility] [address], [place], [place]  [address]  I-Stat venous blood gas, ED     Status: Abnormal   Collection Time: 12/31/22  1:11 AM  Result Value Ref Range   pH, Ven 7.347 7.25 - 7.43   pCO2, Ven 54.1 44 - 60 mmHg   pO2, Ven 37 32 - 45 mmHg   Bicarbonate 29.7 (H) 20.0 - 28.0 mmol/L   TCO2 31 22 - 32 mmol/L   O2 Saturation 67 %   Acid-Base Excess 3.0 (H) 0.0 - 2.0 mmol/L   Sodium 142 135 - 145 mmol/L   Potassium 4.6 3.5 - 5.1 mmol/L   Calcium, Ion 1.15 1.15 - 1.40 mmol/L   HCT 46.0 36.0 - 46.0 %   Hemoglobin 15.6 (H) 12.0 - 15.0 g/dL   Sample type VENOUS    Comment NOTIFIED PHYSICIAN   CBG monitoring, ED     Status: Abnormal   Collection Time: 12/31/22  7:50 AM  Result Value Ref Range   Glucose-Capillary 174 (H) 70 - 99 mg/dL    Comment: Glucose reference range applies only to samples taken after fasting for at least 8 hours.   DG Chest Portable 1 View  Result Date: 12/30/2022 CLINICAL DATA:  Dyspnea EXAM: PORTABLE CHEST 1 VIEW COMPARISON:  Chest x-ray 08/27/2022 FINDINGS: The heart size and mediastinal contours are within normal limits. Both lungs are clear. The visualized skeletal structures are unremarkable. IMPRESSION: No active disease. Electronically Signed   By: Ronney Asters M.D.   On: 12/30/2022 19:21    Pending Labs Unresulted Labs (From admission, onward)     Start     Ordered   01/07/23 0500  Creatinine, serum  (enoxaparin (LOVENOX)    CrCl >/= 30 ml/min)  Weekly,   R     Comments: while on enoxaparin therapy    12/31/22 0041   12/31/22 0500  Blood gas, venous  Tomorrow morning,   R        12/31/22 0058   12/31/22 0500  Hemoglobin A1c  Tomorrow morning,   R       Comments: To assess prior glycemic control    12/31/22 0434            Vitals/Pain Today's Vitals   12/31/22 0730 12/31/22 0815 12/31/22 0900 12/31/22 0930  BP: (!) 159/87 (!) 160/117 (!) 136/123 104/74  Pulse: (!) 112 (!) 114 (!) 118 (!) 101  Resp: (!) 25 (!) 27 (!) 32 20  Temp:      TempSrc:      SpO2: 96% 94% 99% 95%  PainSc:         Isolation Precautions No active isolations  Medications Medications  azithromycin (ZITHROMAX) tablet  500 mg (500 mg Oral Given 12/31/22 0144)    Followed by  azithromycin (ZITHROMAX) tablet 250 mg (has no administration in time range)  enoxaparin (LOVENOX) injection 40 mg (40 mg Subcutaneous Given 12/31/22 0919)  ipratropium-albuterol (DUONEB) 0.5-2.5 (3) MG/3ML nebulizer solution 3 mL (3 mLs Nebulization Given 12/31/22 0741)  methylPREDNISolone sodium succinate (SOLU-MEDROL) 125 mg/2 mL injection 80 mg (80 mg Intravenous Given 12/31/22 0145)  montelukast (SINGULAIR) tablet 10 mg (10 mg Oral Given 12/31/22 0145)  hydrALAZINE (APRESOLINE) injection 5 mg (5 mg Intravenous Given 12/31/22 0458)  insulin aspart (novoLOG) injection 0-9 Units ( Subcutaneous Not Given 12/31/22 0753)  insulin aspart (novoLOG) injection 0-5 Units (has no administration in time range)  aspirin EC tablet 81 mg (81 mg Oral Given 12/31/22 0919)  cloNIDine (CATAPRES) tablet 0.2 mg (0.2 mg Oral Given 12/31/22 0640)  acetaminophen (TYLENOL) tablet 650 mg (has no administration in time range)  prochlorperazine (COMPAZINE) injection 5 mg (has no administration in time range)  polyethylene glycol (MIRALAX / GLYCOLAX) packet 17 g (has no administration in time range)  ipratropium-albuterol (DUONEB) 0.5-2.5 (3) MG/3ML nebulizer solution 3 mL (3 mLs Nebulization Given 12/30/22 1924)  ipratropium-albuterol (DUONEB) 0.5-2.5 (3) MG/3ML nebulizer solution 3 mL (3 mLs Nebulization Given 12/30/22 1924)  magnesium sulfate IVPB 2 g 50 mL (0 g Intravenous Stopped 12/30/22 2014)  gabapentin (NEURONTIN) capsule 200 mg (200 mg Oral Given 12/31/22 0640)    Mobility walks     Focused Assessments Cardiac Assessment Handoff:  Cardiac Rhythm: Sinus tachycardia Lab Results  Component Value Date   CKTOTAL 165 07/31/2018   CKMB 13.7 ([place]) 02/11/2013   TROPONINI <0.03 12/25/2018   TROPONINI <0.03 12/25/2018   Lab Results  Component Value Date    DDIMER 0.32 10/16/2021   Does the Patient currently have chest pain? No   , Pulmonary Assessment Handoff:  Lung sounds: Bilateral Breath Sounds: Expiratory wheezes L Breath Sounds: Rales O2 Device: Nasal Cannula O2 Flow Rate (L/min): 3 L/min    R Recommendations: See Admitting Provider Note  Report given to:   Additional Notes:

## 2022-12-31 NOTE — H&P (Addendum)
History and Physical  Kathleen Rodriguez [id number] DOB: [birth year] DOA: 12/30/2022  Referring physician: Jimmie Molly, PA-EDP  PCP: [name], MD  Outpatient Specialists: Pulmonology Patient coming from: Home  Chief Complaint: Shortness of breath    HPI: Kathleen Rodriguez is a 57 y.o. female with medical history significant for asthma, COPD, intubated once in 2016, OSA on CPAP, chronic diastolic CHF, history of cocaine abuse, history of critical illness myopathy, type 2 diabetes, hypertension, GERD, who was brought into the ED from home due to progressive dyspnea for the past few days.  Associated with a productive cough.  EMS was activated and she was brought into the ED.  En route, she received albuterol, Atrovent nebs, IV Solu-Medrol.  In the ED, audible wheezing noted on exam with increased work of breathing.  Due to worsening work of breathing she was placed on BiPAP.  Her arterial blood gas revealed pH 7.34/62/95/36.  Additionally, she received DuoNebs and IV magnesium 2 g x 1.  TRH, hospitalist service, was asked to admit.  ED Course: Tmax 97.9.  BP 155/95, pulse 94, respiratory 18, saturation 95% on BiPAP.    Review of Systems: Review of systems as noted in the HPI. All other systems reviewed and are negative.   Past Medical History:  Diagnosis Date   Arthritis    "knees, lower back; legs, ankles" (01/27/2016)   Asthma    followed by Dr. Halford Chessman   Asthma    CHF (congestive heart failure) ([place]) 2016   "when I went into a coma"   Cocaine abuse ([place])    Critical illness myopathy 01/2013   Diabetes mellitus without complication ([place])    GERD (gastroesophageal reflux disease)    Hypertension    "doctor took me off RX in 2016" (01/27/2016)   Hypertension    Influenza B 0000000   Complicated by multi-organ failure   OSA on CPAP "since " 03/20/2013   Pneumonia 2016   Required emergent intubation    asthma exacerbation in 2016   Skin ulcer ([place]) secondary to bullous impetigo  /trauma 08/29/2018   Sleep apnea    Tobacco abuse    Upper airway cough syndrome    Past Surgical History:  Procedure Laterality Date   BREAST SURGERY Right 1980   as a teenager , cyst was benign   CESAREAN SECTION  2006   LACERATION REPAIR Right ~ 1997   "tried to cut myself"   TOTAL KNEE ARTHROPLASTY Left 09/01/2016   Procedure: LEFT TOTAL KNEE ARTHROPLASTY;  Surgeon: Leandrew Koyanagi, MD;  Location: [facility];  Service: Orthopedics;  Laterality: Left;   TUBAL LIGATION  2006    Social History:  reports that she quit smoking about 9 years ago. Her smoking use included cigarettes. She started smoking about 31 years ago. She has a 5.25 pack-year smoking history. She has never used smokeless tobacco. She reports that she does not currently use drugs after having used the following drugs: Cocaine and "Crack" cocaine. No history on file for alcohol use.   Allergies  Allergen Reactions   Tomato Hives, Itching and Other (See Comments)    ALSO REACTS TO KETCHUP   Latex Itching and Rash   Wool Alcohol [Lanolin] Itching    Family History  Problem Relation Age of Onset   Asthma Mother    Heart murmur Mother    Cancer Maternal Grandmother    Heart disease Maternal Grandmother    Hypertension Maternal Grandmother    Cancer Paternal Grandmother  Hypertension Mother    HIV/AIDS Father       Prior to Admission medications   Medication Sig Start Date End Date Taking? Authorizing Provider  albuterol (PROAIR HFA) 108 (90 Base) MCG/ACT inhaler TAKE 2 PUFFS BY MOUTH EVERY 6 HOURS AS NEEDED FOR WHEEZE OR SHORTNESS OF BREATH Patient taking differently: Inhale 2 puffs into the lungs every 6 (six) hours as needed for wheezing. 08/30/22  Yes Hosie Poisson, MD  aspirin EC 81 MG tablet Take 1 tablet (81 mg total) by mouth daily. 08/30/22  Yes Hosie Poisson, MD  benzonatate (TESSALON) 200 MG capsule Take 1 capsule (200 mg total) by mouth 3 (three) times daily as needed for cough. 08/30/22  Yes Hosie Poisson,  MD  cloNIDine (CATAPRES) 0.2 MG tablet Take 1 tablet (0.2 mg total) by mouth 2 (two) times daily. For BP and hot flashes 01/18/22  Yes Charlynne Cousins, MD  ipratropium-albuterol (DUONEB) 0.5-2.5 (3) MG/3ML SOLN Inhale 3 mLs by nebulization every 6 (six) hours as needed. Patient taking differently: Take 3 mLs by nebulization every 6 (six) hours as needed (shortness of breath). 08/30/22  Yes Hosie Poisson, MD  Blood Glucose Monitoring Suppl (ACCU-CHEK GUIDE ME) w/Device KIT Use to check blood sugar once daily. 05/04/21   [name], MD  Budeson-Glycopyrrol-Formoterol (BREZTRI AEROSPHERE) 160-9-4.8 MCG/ACT AERO Inhale 2 puffs into the lungs in the morning and at bedtime. Patient not taking: Reported on 12/30/2022 08/30/22   Hosie Poisson, MD  fluticasone [facility]) 50 MCG/ACT nasal spray Place 1 spray into both nostrils daily as needed for allergies. Patient not taking: Reported on 08/31/2022 08/30/22   Hosie Poisson, MD  gabapentin (NEURONTIN) 300 MG capsule Take 1 capsule (300 mg total) by mouth 3 (three) times daily. Patient not taking: Reported on 08/31/2022 08/30/22   Hosie Poisson, MD  glucose blood (ACCU-CHEK GUIDE) test strip Use as instructed daily before breakfast. 10/05/21   [name], MD  loratadine (CLARITIN) 10 MG tablet Take 1 tablet (10 mg total) by mouth daily. 08/30/22   Hosie Poisson, MD  metFORMIN (GLUCOPHAGE) 500 MG tablet Take 1 tablet (500 mg total) by mouth 2 (two) times daily with a meal. Patient not taking: Reported on 08/31/2022 08/30/22   Hosie Poisson, MD  montelukast (SINGULAIR) 10 MG tablet Take 1 tablet (10 mg total) by mouth at bedtime. Patient not taking: Reported on 08/31/2022 08/30/22   Hosie Poisson, MD  pantoprazole (PROTONIX) 40 MG tablet Take 1 tablet (40 mg total) by mouth daily. Patient not taking: Reported on 08/31/2022 08/30/22   Hosie Poisson, MD    Physical Exam: BP (!) 162/99   Pulse 90   Temp 97.9 F (36.6 C) (Axillary)   Resp 18   SpO2 95%    General: 57 y.o. year-old female well developed well nourished in no acute distress.  Alert and oriented x3. Cardiovascular: Regular rate and rhythm with no rubs or gallops.  No thyromegaly or JVD noted.  No lower extremity edema. 2/4 pulses in all 4 extremities. Respiratory: Diffuse wheezing bilaterally.  Good inspiratory effort. Abdomen: Soft nontender nondistended with normal bowel sounds x4 quadrants. Muskuloskeletal: No cyanosis, clubbing or edema noted bilaterally Neuro: CN II-XII intact, strength, sensation, reflexes Skin: No ulcerative lesions noted or rashes Psychiatry: Judgement and insight appear normal. Mood is appropriate for condition and setting          Labs on Admission:  Basic Metabolic Panel: Recent Labs  Lab 12/30/22 1901 12/30/22 2002  NA 141 143  K 4.6 4.6  CL 104  --   CO2 27  --   GLUCOSE 119*  --   BUN 12  --   CREATININE 0.95  --   CALCIUM 8.7*  --    Liver Function Tests: Recent Labs  Lab 12/30/22 1901  AST 26  ALT 19  ALKPHOS 61  BILITOT 0.5  PROT 6.3*  ALBUMIN 3.1*   No results for input(s): "LIPASE", "AMYLASE" in the last 168 hours. No results for input(s): "AMMONIA" in the last 168 hours. CBC: Recent Labs  Lab 12/30/22 1901 12/30/22 2002  WBC 6.9  --   NEUTROABS 3.9  --   HGB 14.6 16.0*  HCT 47.2* 47.0*  MCV 87.4  --   PLT 352  --    Cardiac Enzymes: No results for input(s): "CKTOTAL", "CKMB", "CKMBINDEX", "TROPONINI" in the last 168 hours.  BNP (last 3 results) Recent Labs    12/30/22 1901  BNP 27.1    ProBNP (last 3 results) No results for input(s): "PROBNP" in the last 8760 hours.  CBG: No results for input(s): "GLUCAP" in the last 168 hours.  Radiological Exams on Admission: DG Chest Portable 1 View  Result Date: 12/30/2022 CLINICAL DATA:  Dyspnea EXAM: PORTABLE CHEST 1 VIEW COMPARISON:  Chest x-ray 08/27/2022 FINDINGS: The heart size and mediastinal contours are within normal limits. Both lungs are clear. The  visualized skeletal structures are unremarkable. IMPRESSION: No active disease. Electronically Signed   By: Ronney Asters M.D.   On: 12/30/2022 19:21    EKG: I independently viewed the EKG done and my findings are as followed:    Assessment/Plan Present on Admission:  COPD with acute exacerbation ([place])  Principal Problem:   COPD with acute exacerbation ([place])  COPD with acute exacerbation/asthma with acute exacerbation Received IV Solu-Medrol via EMS, DuoNebs, and magnesium Continue DuoNebs, IV Solu-Medrol As needed antitussives Incentive spirometer Add Z-Pak x 5 days Home O2 evaluation prior to DC.  Acute hypoxic and hypercarbic respiratory failure Currently on BiPAP Repeat VBG in the morning Wean off O2 supplementation as tolerated Home O2 evaluation on 12/31/2022.  Hypertension BP is not at goal, elevated Not on oral antihypertensives IV antihypertensives as needed with parameters. Closely monitor vital signs  Obesity Recommend weight loss outpatient with regular physical activity and healthy dieting.    DVT prophylaxis: Subcu Lovenox daily  Code Status: Full code.  Family Communication: None at bedside.    Disposition Plan: Admitted to progressive care unit  Consults called: None.  Admission status: Observation status.   Status is: Observation    Kayleen Memos MD [facility] Pager [phone]  If 7PM-7AM, please contact night-coverage [email or web address] Password [facility]  12/31/2022, 12:27 AM

## 2022-12-31 NOTE — ED Notes (Signed)
Brittany(daughter) of pt requesting status update call back # [phone]

## 2022-12-31 NOTE — ED Notes (Addendum)
Report attempted to be called, No response from floor. Floor notified, pt being TXed

## 2023-01-01 DIAGNOSIS — J441 Chronic obstructive pulmonary disease with (acute) exacerbation: Secondary | ICD-10-CM | POA: Diagnosis not present

## 2023-01-01 LAB — GLUCOSE, CAPILLARY
Glucose-Capillary: 152 mg/dL — ABNORMAL HIGH (ref 70–99)
Glucose-Capillary: 75 mg/dL (ref 70–99)
Glucose-Capillary: 149 mg/dL — ABNORMAL HIGH (ref 70–99)

## 2023-01-01 MED ORDER — FUROSEMIDE 10 MG/ML IJ SOLN
40.0000 mg | Freq: Once | INTRAMUSCULAR | Status: AC
  Administered 2023-01-01: 40 mg via INTRAVENOUS
  Filled 2023-01-01: qty 4

## 2023-01-01 MED ORDER — ESCITALOPRAM OXALATE 10 MG PO TABS
10.0000 mg | ORAL_TABLET | Freq: Every day | ORAL | Status: DC
  Administered 2023-01-01 – 2023-01-02 (×2): 10 mg via ORAL
  Filled 2023-01-01 (×2): qty 1

## 2023-01-01 MED ORDER — FLUTICASONE PROPIONATE 50 MCG/ACT NA SUSP
2.0000 | Freq: Every day | NASAL | Status: DC
  Administered 2023-01-01 – 2023-01-02 (×2): 2 via NASAL
  Filled 2023-01-01: qty 16

## 2023-01-01 MED ORDER — GUAIFENESIN-DM 100-10 MG/5ML PO SYRP: 5.0000 mL | ORAL_SOLUTION | ORAL | Status: DC | PRN

## 2023-01-01 NOTE — Plan of Care (Signed)
  Problem: Education: Goal: Ability to describe self-care measures that may prevent or decrease complications (Diabetes Survival Skills Education) will improve Outcome: Progressing Goal: Individualized Educational Video(s) Outcome: Progressing   Problem: Coping: Goal: Ability to adjust to condition or change in health will improve Outcome: Progressing   Problem: Fluid Volume: Goal: Ability to maintain a balanced intake and output will improve Outcome: Progressing   Problem: Health Behavior/Discharge Planning: Goal: Ability to identify and utilize available resources and services will improve Outcome: Progressing Goal: Ability to manage health-related needs will improve Outcome: Progressing   Problem: Metabolic: Goal: Ability to maintain appropriate glucose levels will improve Outcome: Progressing   Problem: Nutritional: Goal: Maintenance of adequate nutrition will improve Outcome: Progressing Goal: Progress toward achieving an optimal weight will improve Outcome: Progressing   Problem: Skin Integrity: Goal: Risk for impaired skin integrity will decrease Outcome: Progressing   Problem: Tissue Perfusion: Goal: Adequacy of tissue perfusion will improve Outcome: Progressing   Problem: Education: Goal: Knowledge of General Education information will improve Description: Including pain rating scale, medication(s)/side effects and non-pharmacologic comfort measures Outcome: Progressing   Problem: Health Behavior/Discharge Planning: Goal: Ability to manage health-related needs will improve Outcome: Progressing   Problem: Clinical Measurements: Goal: Ability to maintain clinical measurements within normal limits will improve Outcome: Progressing Goal: Will remain free from infection Outcome: Progressing Goal: Diagnostic test results will improve Outcome: Progressing Goal: Respiratory complications will improve Outcome: Progressing Goal: Cardiovascular complication will  be avoided Outcome: Progressing   Problem: Activity: Goal: Risk for activity intolerance will decrease Outcome: Progressing   Problem: Nutrition: Goal: Adequate nutrition will be maintained Outcome: Progressing   Problem: Coping: Goal: Level of anxiety will decrease Outcome: Progressing   Problem: Elimination: Goal: Will not experience complications related to bowel motility Outcome: Progressing Goal: Will not experience complications related to urinary retention Outcome: Progressing   Problem: Pain Managment: Goal: General experience of comfort will improve Outcome: Progressing   Problem: Safety: Goal: Ability to remain free from injury will improve Outcome: Progressing   Problem: Skin Integrity: Goal: Risk for impaired skin integrity will decrease Outcome: Progressing   Problem: Education: Goal: Knowledge of disease or condition will improve Outcome: Progressing Goal: Knowledge of the prescribed therapeutic regimen will improve Outcome: Progressing Goal: Individualized Educational Video(s) Outcome: Progressing   Problem: Activity: Goal: Ability to tolerate increased activity will improve Outcome: Progressing Goal: Will verbalize the importance of balancing activity with adequate rest periods Outcome: Progressing   Problem: Respiratory: Goal: Ability to maintain a clear airway will improve Outcome: Progressing Goal: Levels of oxygenation will improve Outcome: Progressing Goal: Ability to maintain adequate ventilation will improve Outcome: Progressing   

## 2023-01-01 NOTE — Progress Notes (Signed)
RT attempted peak flow with patient x3. 100 was the best achieved at this time. Will try again in the morning (3/11).

## 2023-01-01 NOTE — Progress Notes (Signed)
PROGRESS NOTE    Kathleen Rodriguez  [id number] DOB: Mar 12, 1966 DOA: 12/30/2022 PCP: [name], MD  Outpatient Specialists:     Brief Narrative:  Patient is a 58 year old female past medical history significant for asthma, COPD, OSA on CPAP, chronic diastolic CHF, history of cocaine use, history of critical illness myopathy, type 2 diabetes mellitus, hypertension and GERD.  Patient was admitted with progressive dyspnea.  01/01/2023: Patient continues to wheeze, however, improved.  Significant leg edema.  Decreased air entry noted.  Will try IV Lasix 40 Mg x 1 dose.  Continue to assess need for further diuretics.  Will also start incentive spirometry and flutter valve device therapy.  Peak flow daily.  Lexapro for anxiety.  Continue to assess improvement in respiratory symptoms.   Assessment & Plan:   Principal Problem:   COPD with acute exacerbation ([place]) Active Problems:   COPD exacerbation ([facility])   COPD with acute exacerbation/asthma with acute exacerbation Received IV Solu-Medrol via EMS, DuoNebs, and magnesium Continue DuoNebs, IV Solu-Medrol As needed antitussives Incentive spirometer Add Z-Pak x 5 days Home O2 evaluation prior to DC. 01/01/2023: Continue IV Solu-Medrol, DuoNeb and montelukast.   Acute hypoxic and hypercarbic respiratory failure Currently on BiPAP Repeat VBG in the morning Wean off O2 supplementation as tolerated Home O2 evaluation on 12/31/2022. 01/01/2023: Patient is off BiPAP.  Continue to optimize respiratory management.   Hypertension BP is not at goal, elevated Not on oral antihypertensives IV antihypertensives as needed with parameters. Closely monitor vital signs 01/01/2023: Blood pressure is controlled today.  Continue to monitor closely.    Obesity Recommend weight loss outpatient with regular physical activity and healthy dieting.   DVT prophylaxis: Subcutaneous Lovenox Code Status: Full code Family Communication:  Disposition Plan:  Home eventually   Consultants:  None  Procedures:  None  Antimicrobials:  Azithromycin   Subjective: Shortness of breath and wheezing are slowly improving.  Objective: Vitals:   01/01/23 0820 01/01/23 1203 01/01/23 1449 01/01/23 1557  BP:  (!) 140/90  116/67  Pulse: 83 88 94 97  Resp: 16  18   Temp:  97.6 F (36.4 C)  97.9 F (36.6 C)  TempSrc:  Oral  Oral  SpO2:  93% 99% 95%    Intake/Output Summary (Last 24 hours) at 01/01/2023 1744 Last data filed at 12/31/2022 2100 Gross per 24 hour  Intake 240 ml  Output --  Net 240 ml    There were no vitals filed for this visit.  Examination:  General exam: Appears calm and comfortable  Respiratory system: Decreased air entry.  Expiratory wheeze, but improving.   Cardiovascular system: S1 & S2 heard Gastrointestinal system: Abdomen is obese, soft and nontender.   Central nervous system: Alert and oriented. No focal neurological deficits. Extremities: Bilateral lower extremity edema.    Data Reviewed: I have personally reviewed following labs and imaging studies  CBC: Recent Labs  Lab 12/30/22 1901 12/30/22 2002 12/31/22 0105 12/31/22 0111  WBC 6.9  --  6.4  --   NEUTROABS 3.9  --   --   --   HGB 14.6 16.0* 14.5 15.6*  HCT 47.2* 47.0* 44.9 46.0  MCV 87.4  --  86.7  --   PLT 352  --  268  --     Basic Metabolic Panel: Recent Labs  Lab 12/30/22 1901 12/30/22 2002 12/31/22 0105 12/31/22 0111  NA 141 143 141 142  K 4.6 4.6 4.7 4.6  CL 104  --  106  --  CO2 27  --  30  --   GLUCOSE 119*  --  145*  --   BUN 12  --  16  --   CREATININE 0.95  --  0.94  --   CALCIUM 8.7*  --  8.7*  --   MG  --   --  2.5*  --     GFR: CrCl cannot be calculated (Unknown ideal weight.). Liver Function Tests: Recent Labs  Lab 12/30/22 1901  AST 26  ALT 19  ALKPHOS 61  BILITOT 0.5  PROT 6.3*  ALBUMIN 3.1*    No results for input(s): "LIPASE", "AMYLASE" in the last 168 hours. No results for input(s): "AMMONIA"  in the last 168 hours. Coagulation Profile: No results for input(s): "INR", "PROTIME" in the last 168 hours. Cardiac Enzymes: No results for input(s): "CKTOTAL", "CKMB", "CKMBINDEX", "TROPONINI" in the last 168 hours. BNP (last 3 results) No results for input(s): "PROBNP" in the last 8760 hours. HbA1C: No results for input(s): "HGBA1C" in the last 72 hours. CBG: Recent Labs  Lab 12/31/22 1235 12/31/22 1611 12/31/22 2133 01/01/23 1207 01/01/23 1602  GLUCAP 150* 97 158* 152* 75    Lipid Profile: No results for input(s): "CHOL", "HDL", "LDLCALC", "TRIG", "CHOLHDL", "LDLDIRECT" in the last 72 hours. Thyroid Function Tests: No results for input(s): "TSH", "T4TOTAL", "FREET4", "T3FREE", "THYROIDAB" in the last 72 hours. Anemia Panel: No results for input(s): "VITAMINB12", "FOLATE", "FERRITIN", "TIBC", "IRON", "RETICCTPCT" in the last 72 hours. Urine analysis:    Component Value Date/Time   COLORURINE YELLOW 05/29/2022 2230   APPEARANCEUR CLEAR 05/29/2022 2230   LABSPEC 1.023 05/29/2022 2230   PHURINE 5.0 05/29/2022 2230   GLUCOSEU NEGATIVE 05/29/2022 2230   GLUCOSEU NEGATIVE 02/10/2022 1130   HGBUR SMALL (A) 05/29/2022 2230   BILIRUBINUR NEGATIVE 05/29/2022 2230   BILIRUBINUR small 05/26/2017 1539   KETONESUR NEGATIVE 05/29/2022 2230   PROTEINUR NEGATIVE 05/29/2022 2230   UROBILINOGEN 0.2 02/10/2022 1130   NITRITE NEGATIVE 05/29/2022 2230   LEUKOCYTESUR NEGATIVE 05/29/2022 2230   Sepsis Labs: '@LABRCNTIP'$ (procalcitonin:4,lacticidven:4)  ) Recent Results (from the past 240 hour(s))  Resp panel by RT-PCR (RSV, Flu A&B, Covid) Anterior Nasal Swab     Status: None   Collection Time: 12/30/22  8:17 PM   Specimen: Anterior Nasal Swab  Result Value Ref Range Status   SARS Coronavirus 2 by RT PCR NEGATIVE NEGATIVE Final   Influenza A by PCR NEGATIVE NEGATIVE Final   Influenza B by PCR NEGATIVE NEGATIVE Final    Comment: (NOTE) The Xpert Xpress SARS-CoV-2/FLU/RSV plus assay is  intended as an aid in the diagnosis of influenza from Nasopharyngeal swab specimens and should not be used as a sole basis for treatment. Nasal washings and aspirates are unacceptable for Xpert Xpress SARS-CoV-2/FLU/RSV testing.  Fact Sheet for Patients: EntrepreneurPulse.com.au  Fact Sheet for Healthcare Providers: IncredibleEmployment.be  This test is not yet approved or cleared by the [place] FDA and has been authorized for detection and/or diagnosis of SARS-CoV-2 by FDA under an Emergency Use Authorization (EUA). This EUA will remain in effect (meaning this test can be used) for the duration of the COVID-19 declaration under Section 564(b)(1) of the Act, 21 U.S.C. section 360bbb-3(b)(1), unless the authorization is terminated or revoked.     Resp Syncytial Virus by PCR NEGATIVE NEGATIVE Final    Comment: (NOTE) Fact Sheet for Patients: EntrepreneurPulse.com.au  Fact Sheet for Healthcare Providers: IncredibleEmployment.be  This test is not yet approved or cleared by the [place] and  has been authorized for detection and/or diagnosis of SARS-CoV-2 by FDA under an Emergency Use Authorization (EUA). This EUA will remain in effect (meaning this test can be used) for the duration of the COVID-19 declaration under Section 564(b)(1) of the Act, 21 U.S.C. section 360bbb-3(b)(1), unless the authorization is terminated or revoked.  Performed at [facility], [place] [address], [place], [place] [address]          Radiology Studies: DG Chest Portable 1 View  Result Date: 12/30/2022 CLINICAL DATA:  Dyspnea EXAM: PORTABLE CHEST 1 VIEW COMPARISON:  Chest x-ray 08/27/2022 FINDINGS: The heart size and mediastinal contours are within normal limits. Both lungs are clear. The visualized skeletal structures are unremarkable. IMPRESSION: No active disease. Electronically Signed   By: Ronney Asters  M.D.   On: 12/30/2022 19:21        Scheduled Meds:  aspirin EC  81 mg Oral Daily   azithromycin  250 mg Oral Daily   cloNIDine  0.2 mg Oral BID   enoxaparin (LOVENOX) injection  40 mg Subcutaneous Daily   escitalopram  10 mg Oral Daily   fluticasone  2 spray Each Nare Daily   insulin aspart  0-5 Units Subcutaneous QHS   insulin aspart  0-9 Units Subcutaneous TID WC   ipratropium-albuterol  3 mL Nebulization Q6H   methylPREDNISolone (SOLU-MEDROL) injection  40 mg Intravenous Q12H   montelukast  10 mg Oral QHS   Continuous Infusions:   LOS: 1 day    Time spent: 35 minutes.    Dana Allan, MD  [facility] Pager #: [phone] 7PM-7AM contact night coverage as above

## 2023-01-01 NOTE — Progress Notes (Signed)
   12/31/22 2126  Assess: MEWS Score  BP (!) 161/80  MAP (mmHg) 100  Pulse Rate (!) 119  ECG Heart Rate (!) 119  Resp (!) 21  SpO2 94 %  Assess: MEWS Score  MEWS Temp 0  MEWS Systolic 0  MEWS Pulse 2  MEWS RR 1  MEWS LOC 0  MEWS Score 3  MEWS Score Color Yellow  Assess: if the MEWS score is Yellow or Red  Were vital signs taken at a resting state? Yes  Focused Assessment No change from prior assessment  Does the patient meet 2 or more of the SIRS criteria? Yes  Does the patient have a confirmed or suspected source of infection? No  MEWS guidelines implemented  Yes, yellow  Treat  MEWS Interventions Considered administering scheduled or prn medications/treatments as ordered  Take Vital Signs  Increase Vital Sign Frequency  Yellow: Q2hr x1, continue Q4hrs until patient remains green for 12hrs  Escalate  MEWS: Escalate Yellow: Discuss with charge nurse and consider notifying provider and/or RRT  Notify: Charge Nurse/RN  Name of Charge Nurse/RN Notified Nikki Faucette  Assess: SIRS CRITERIA  SIRS Temperature  0  SIRS Pulse 1  SIRS Respirations  1  SIRS WBC 0  SIRS Score Sum  2

## 2023-01-02 DIAGNOSIS — J441 Chronic obstructive pulmonary disease with (acute) exacerbation: Secondary | ICD-10-CM | POA: Diagnosis not present

## 2023-01-02 LAB — GLUCOSE, CAPILLARY
Glucose-Capillary: 123 mg/dL — ABNORMAL HIGH (ref 70–99)
Glucose-Capillary: 95 mg/dL (ref 70–99)
Glucose-Capillary: 138 mg/dL — ABNORMAL HIGH (ref 70–99)

## 2023-01-02 LAB — HEMOGLOBIN A1C
Hgb A1c MFr Bld: 5.8 % — ABNORMAL HIGH (ref 4.8–5.6)
Mean Plasma Glucose: 120 mg/dL

## 2023-01-02 MED ORDER — ESCITALOPRAM OXALATE 10 MG PO TABS: 10.0000 mg | ORAL_TABLET | Freq: Every day | ORAL | 2 refills | Status: AC

## 2023-01-02 MED ORDER — CLONIDINE HCL 0.2 MG PO TABS: 0.2000 mg | ORAL_TABLET | Freq: Two times a day (BID) | ORAL | 2 refills | Status: DC

## 2023-01-02 MED ORDER — AZITHROMYCIN 250 MG PO TABS: 250.0000 mg | ORAL_TABLET | Freq: Every day | ORAL | 0 refills | Status: AC

## 2023-01-02 MED ORDER — MONTELUKAST SODIUM 10 MG PO TABS: 10.0000 mg | ORAL_TABLET | Freq: Every day | ORAL | 2 refills | Status: DC

## 2023-01-02 MED ORDER — PANTOPRAZOLE SODIUM 40 MG PO TBEC: 40.0000 mg | DELAYED_RELEASE_TABLET | Freq: Every day | ORAL | 2 refills | Status: AC

## 2023-01-02 MED ORDER — ALBUTEROL SULFATE HFA 108 (90 BASE) MCG/ACT IN AERS: 2.0000 | INHALATION_SPRAY | Freq: Four times a day (QID) | RESPIRATORY_TRACT | 3 refills | Status: DC | PRN

## 2023-01-02 MED ORDER — IPRATROPIUM-ALBUTEROL 0.5-2.5 (3) MG/3ML IN SOLN: 3.0000 mL | Freq: Four times a day (QID) | RESPIRATORY_TRACT | 3 refills | Status: AC | PRN

## 2023-01-02 MED ORDER — PREDNISONE 10 MG PO TABS: ORAL_TABLET | ORAL | 0 refills | Status: AC

## 2023-01-02 MED ORDER — ASPIRIN 81 MG PO TBEC: 81.0000 mg | DELAYED_RELEASE_TABLET | Freq: Every day | ORAL | 2 refills | Status: AC

## 2023-01-02 MED ORDER — FLUTICASONE PROPIONATE 50 MCG/ACT NA SUSP: 1.0000 | Freq: Every day | NASAL | 3 refills | Status: DC | PRN

## 2023-01-02 MED ORDER — BREZTRI AEROSPHERE 160-9-4.8 MCG/ACT IN AERO: 2.0000 | INHALATION_SPRAY | Freq: Two times a day (BID) | RESPIRATORY_TRACT | 5 refills | Status: DC

## 2023-01-02 MED ORDER — LORATADINE 10 MG PO TABS: 10.0000 mg | ORAL_TABLET | Freq: Every day | ORAL | 2 refills | Status: DC

## 2023-01-02 MED ORDER — GABAPENTIN 300 MG PO CAPS: 300.0000 mg | ORAL_CAPSULE | Freq: Three times a day (TID) | ORAL | 2 refills | Status: DC

## 2023-01-02 MED ORDER — CLONIDINE HCL 0.2 MG PO TABS: 0.2000 mg | ORAL_TABLET | Freq: Two times a day (BID) | ORAL | 2 refills | Status: AC
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
## 2023-01-02 NOTE — Progress Notes (Signed)
Rt attempted peak flow with pt x3, 130 was the best out of 3 attempts. RT will attempt again in the morning.

## 2023-01-02 NOTE — Discharge Summary (Signed)
Physician Discharge Summary  [name] [id number] DOB: 1966-02-24 DOA: 12/30/2022  PCP: [name], MD  Admit date: 12/30/2022 Discharge date: 01/02/2023  Admitted From: Home Disposition: Home  Recommendations for Outpatient Follow-up:  Follow up with PCP in 1-2 weeks Continue azithromycin and prednisone taper to complete course for acute COPD exacerbation. Started on Lexapro for anxiety Refilled home medications  Home Health: No Equipment/Devices: None  Discharge Condition: Stable CODE STATUS: Full code Diet recommendation: Heart healthy diet  History of present illness:  Kathleen Rodriguez is a 57 year old female with past medical history significant for asthma/COPD, chronic diastolic congestive heart failure, OSA on CPAP, history of critical illness myopathy, type 2 diabetes mellitus, essential hypertension, GERD, history of cocaine abuse who presented to [facility] ED on 3/8 with progressive shortness of breath.  Patient reports onset few days prior to admission associated with a productive cough.  EMS was activated, and patient received albuterol, Atrovent nebs, IV Solu-Medrol and route to the ED.  In the ED, temperature 97.9 F, BP 155/95, HR 94, RR 18, SpO2 95% on BiPAP.  Sodium 141, potassium 4.6, chloride 104, CO2 27, glucose 119, BUN 12, creatinine 0.95.  WBC 6.9, hemoglobin 14.6, platelets 352.  BNP 27.1.  COVID-19/influenza A/B/RSV PCR negative.  Chest x-ray with no active cardiopulmonary disease process.  VBG with pH 7.34, pCO2 62.0, pO2 95.  Due to increased work of breathing, patient was placed on BiPAP.  Patient received DuoNebs, IV magnesium by EDP.  TRH consulted for further evaluation and management of COPD exacerbation.  Hospital course:  Acute COPD exacerbation  Acute hypoxic/hypercarbic respiratory failure, POA Patient presenting to ED with progressive shortness of breath associated with productive cough.  Patient was afebrile without leukocytosis.  BNP within  normal limits.  Chest x-ray with no acute cardiopulmonary disease process.  Initially requiring BiPAP which was weaned off.  Patient was started on treatment with IV steroids, scheduled DuoNebs and azithromycin.  Patient's dyspnea gradually improved during hospitalization and not requiring oxygen.  Will continue prednisone taper, azithromycin to complete treatment course on discharge.  Refilled home Breztri inhaler and Singulair.  Outpatient follow-up with PCP.  Hx essential hypertension Continue clonidine 0.2 mg p.o. twice daily.  Outpatient follow-up with PCP.  Anxiety/depression: Started on Lexapro 10 mg p.o. daily.  Continue clonidine 0.2 mg p.o. twice daily.  Hx T2DM Previously on metformin, currently not on any medication outpatient.  Hemoglobin A1c 5.8.  Continue diet control.  Outpatient follow-up with PCP.  OSA Continue nocturnal CPAP  GERD Continue PPI  History of cocaine abuse Continue to encourage abstinence   Discharge Diagnoses:  Principal Problem:   COPD with acute exacerbation ([place]) Active Problems:   COPD exacerbation [facility])    Discharge Instructions  Discharge Instructions     Call MD for:  difficulty breathing, headache or visual disturbances   Complete by: As directed    Call MD for:  extreme fatigue   Complete by: As directed    Call MD for:  persistant dizziness or light-headedness   Complete by: As directed    Call MD for:  persistant nausea and vomiting   Complete by: As directed    Call MD for:  severe uncontrolled pain   Complete by: As directed    Call MD for:  temperature >100.4   Complete by: As directed    Diet - low sodium heart healthy   Complete by: As directed    Increase activity slowly   Complete by: As directed  Allergies as of 01/02/2023       Reactions   Tomato Hives, Itching, Other (See Comments)   ALSO REACTS TO KETCHUP   Latex Itching, Rash   Wool Alcohol [lanolin] Itching        Medication List     STOP  taking these medications    metFORMIN 500 MG tablet Commonly known as: GLUCOPHAGE       TAKE these medications    Accu-Chek Guide Me w/Device Kit Use to check blood sugar once daily.   Accu-Chek Guide test strip Generic drug: glucose blood Use as instructed daily before breakfast.   albuterol 108 (90 Base) MCG/ACT inhaler Commonly known as: ProAir HFA Inhale 2 puffs into the lungs every 6 (six) hours as needed for wheezing.   aspirin EC 81 MG tablet Take 1 tablet (81 mg total) by mouth daily.   azithromycin 250 MG tablet Commonly known as: Zithromax Take 1 tablet (250 mg total) by mouth daily for 2 days. Start taking on: January 03, 2023   benzonatate 200 MG capsule Commonly known as: TESSALON Take 1 capsule (200 mg total) by mouth 3 (three) times daily as needed for cough.   Breztri Aerosphere 160-9-4.8 MCG/ACT Aero Generic drug: Budeson-Glycopyrrol-Formoterol Inhale 2 puffs into the lungs in the morning and at bedtime.   cloNIDine 0.2 MG tablet Commonly known as: CATAPRES Take 1 tablet (0.2 mg total) by mouth 2 (two) times daily. For BP and hot flashes   escitalopram 10 MG tablet Commonly known as: LEXAPRO Take 1 tablet (10 mg total) by mouth daily. Start taking on: January 03, 2023   fluticasone 50 MCG/ACT nasal spray Commonly known as: FLONASE Place 1 spray into both nostrils daily as needed for allergies.   gabapentin 300 MG capsule Commonly known as: NEURONTIN Take 1 capsule (300 mg total) by mouth 3 (three) times daily.   ipratropium-albuterol 0.5-2.5 (3) MG/3ML Soln Commonly known as: DUONEB Take 3 mLs by nebulization every 6 (six) hours as needed (shortness of breath).   loratadine 10 MG tablet Commonly known as: CLARITIN Take 1 tablet (10 mg total) by mouth daily.   montelukast 10 MG tablet Commonly known as: SINGULAIR Take 1 tablet (10 mg total) by mouth at bedtime.   pantoprazole 40 MG tablet Commonly known as: PROTONIX Take 1 tablet (40 mg  total) by mouth daily.   predniSONE 10 MG tablet Commonly known as: DELTASONE Take 4 tablets (40 mg total) by mouth daily for 3 days, THEN 3 tablets (30 mg total) daily for 3 days, THEN 2 tablets (20 mg total) daily for 3 days, THEN 1 tablet (10 mg total) daily for 3 days. Start taking on: January 03, 2023        Follow-up Information     [name], MD. Schedule an appointment as soon as possible for a visit in 1 week(s).   Specialty: Family Medicine Contact information: [address] [place] [address] [place] [address] [phone]                Allergies  Allergen Reactions   Tomato Hives, Itching and Other (See Comments)    ALSO REACTS TO KETCHUP   Latex Itching and Rash   Wool Alcohol [Lanolin] Itching    Consultations: none   Procedures/Studies: DG Chest Portable 1 View  Result Date: 12/30/2022 CLINICAL DATA:  Dyspnea EXAM: PORTABLE CHEST 1 VIEW COMPARISON:  Chest x-ray 08/27/2022 FINDINGS: The heart size and mediastinal contours are within normal limits. Both lungs are  clear. The visualized skeletal structures are unremarkable. IMPRESSION: No active disease. Electronically Signed   By: Ronney Asters M.D.   On: 12/30/2022 19:21     Subjective: Patient seen examined bedside, resting comfortably.  Lying in bed.  Remains on room air.  Shortness of breath is much improved.  Discharging home today.  Patient request to speak with social work prior to discharge.  She reports that she is being evicted from her current housing situation.  Requesting note to remain in her current residence until other housing found given her underlying medical condition requiring need of electricity for her nebulizer machine.  No other questions or concerns at this time.  Denies headache, no dizziness, no chest pain, no abdominal pain, no fever/chills/night sweats, no nausea/vomitus diarrhea, no focal weakness, no fatigue, no paresthesias.  No acute events overnight per nursing  staff.  Discharge Exam: Vitals:   01/02/23 0805 01/02/23 0821  BP: 126/83   Pulse: 93   Resp: 18   Temp: 97.8 F (36.6 C)   SpO2: 93% 97%   Vitals:   01/02/23 0254 01/02/23 0400 01/02/23 0805 01/02/23 0821  BP:  118/68 126/83   Pulse:  89 93   Resp:  19 18   Temp:  97.9 F (36.6 C) 97.8 F (36.6 C)   TempSrc:  Oral Oral   SpO2: 98% 93% 93% 97%    Physical Exam: GEN: NAD, alert and oriented x 3, wd/wn HEENT: NCAT, PERRL, EOMI, sclera clear, MMM PULM: Mild late expiratory wheezing bilateral bases, otherwise clear to auscultation throughout all other lung fields, normal [place] effort without accessory muscle use, on room air at rest. CV: RRR w/o M/G/R GI: abd soft, NTND, NABS, no R/G/M MSK: no peripheral edema, muscle strength globally intact 5/5 bilateral upper/lower extremities NEURO: CN II-XII intact, no focal deficits, sensation to light touch intact PSYCH: normal mood/affect Integumentary: dry/intact, no rashes or wounds    The results of significant diagnostics from this hospitalization (including imaging, microbiology, ancillary and laboratory) are listed below for reference.     Microbiology: Recent Results (from the past 240 hour(s))  Resp panel by RT-PCR (RSV, Flu A&B, Covid) Anterior Nasal Swab     Status: None   Collection Time: 12/30/22  8:17 PM   Specimen: Anterior Nasal Swab  Result Value Ref Range Status   SARS Coronavirus 2 by RT PCR NEGATIVE NEGATIVE Final   Influenza A by PCR NEGATIVE NEGATIVE Final   Influenza B by PCR NEGATIVE NEGATIVE Final    Comment: (NOTE) The Xpert Xpress SARS-CoV-2/FLU/RSV plus assay is intended as an aid in the diagnosis of influenza from Nasopharyngeal swab specimens and should not be used as a sole basis for treatment. Nasal washings and aspirates are unacceptable for Xpert Xpress SARS-CoV-2/FLU/RSV testing.  Fact Sheet for Patients: EntrepreneurPulse.com.au  Fact Sheet for Healthcare  Providers: IncredibleEmployment.be  This test is not yet approved or cleared by the [place] FDA and has been authorized for detection and/or diagnosis of SARS-CoV-2 by FDA under an Emergency Use Authorization (EUA). This EUA will remain in effect (meaning this test can be used) for the duration of the COVID-19 declaration under Section 564(b)(1) of the Act, 21 U.S.C. section 360bbb-3(b)(1), unless the authorization is terminated or revoked.     Resp Syncytial Virus by PCR NEGATIVE NEGATIVE Final    Comment: (NOTE) Fact Sheet for Patients: EntrepreneurPulse.com.au  Fact Sheet for Healthcare Providers: IncredibleEmployment.be  This test is not yet approved or cleared by the [place]  FDA and has been authorized for detection and/or diagnosis of SARS-CoV-2 by FDA under an Emergency Use Authorization (EUA). This EUA will remain in effect (meaning this test can be used) for the duration of the COVID-19 declaration under Section 564(b)(1) of the Act, 21 U.S.C. section 360bbb-3(b)(1), unless the authorization is terminated or revoked.  Performed at [facility], [place] [address], [place], [place] [address]      Labs: BNP (last 3 results) Recent Labs    12/30/22 1901  BNP 0000000   Basic Metabolic Panel: Recent Labs  Lab 12/30/22 1901 12/30/22 2002 12/31/22 0105 12/31/22 0111  NA 141 143 141 142  K 4.6 4.6 4.7 4.6  CL 104  --  106  --   CO2 27  --  30  --   GLUCOSE 119*  --  145*  --   BUN 12  --  16  --   CREATININE 0.95  --  0.94  --   CALCIUM 8.7*  --  8.7*  --   MG  --   --  2.5*  --    Liver Function Tests: Recent Labs  Lab 12/30/22 1901  AST 26  ALT 19  ALKPHOS 61  BILITOT 0.5  PROT 6.3*  ALBUMIN 3.1*   No results for input(s): "LIPASE", "AMYLASE" in the last 168 hours. No results for input(s): "AMMONIA" in the last 168 hours. CBC: Recent Labs  Lab 12/30/22 1901 12/30/22 2002  12/31/22 0105 12/31/22 0111  WBC 6.9  --  6.4  --   NEUTROABS 3.9  --   --   --   HGB 14.6 16.0* 14.5 15.6*  HCT 47.2* 47.0* 44.9 46.0  MCV 87.4  --  86.7  --   PLT 352  --  268  --    Cardiac Enzymes: No results for input(s): "CKTOTAL", "CKMB", "CKMBINDEX", "TROPONINI" in the last 168 hours. BNP: Invalid input(s): "POCBNP" CBG: Recent Labs  Lab 01/01/23 0742 01/01/23 1207 01/01/23 1602 01/01/23 2123 01/02/23 0801  GLUCAP 123* 152* 75 149* 95   D-Dimer No results for input(s): "DDIMER" in the last 72 hours. Hgb A1c Recent Labs    12/31/22 0105  HGBA1C 5.8*   Lipid Profile No results for input(s): "CHOL", "HDL", "LDLCALC", "TRIG", "CHOLHDL", "LDLDIRECT" in the last 72 hours. Thyroid function studies No results for input(s): "TSH", "T4TOTAL", "T3FREE", "THYROIDAB" in the last 72 hours.  Invalid input(s): "FREET3" Anemia work up No results for input(s): "VITAMINB12", "FOLATE", "FERRITIN", "TIBC", "IRON", "RETICCTPCT" in the last 72 hours. Urinalysis    Component Value Date/Time   COLORURINE YELLOW 05/29/2022 2230   APPEARANCEUR CLEAR 05/29/2022 2230   LABSPEC 1.023 05/29/2022 2230   PHURINE 5.0 05/29/2022 2230   GLUCOSEU NEGATIVE 05/29/2022 2230   GLUCOSEU NEGATIVE 02/10/2022 1130   HGBUR SMALL (A) 05/29/2022 2230   BILIRUBINUR NEGATIVE 05/29/2022 2230   BILIRUBINUR small 05/26/2017 1539   KETONESUR NEGATIVE 05/29/2022 2230   PROTEINUR NEGATIVE 05/29/2022 2230   UROBILINOGEN 0.2 02/10/2022 1130   NITRITE NEGATIVE 05/29/2022 2230   LEUKOCYTESUR NEGATIVE 05/29/2022 2230   Sepsis Labs Recent Labs  Lab 12/30/22 1901 12/31/22 0105  WBC 6.9 6.4   Microbiology Recent Results (from the past 240 hour(s))  Resp panel by RT-PCR (RSV, Flu A&B, Covid) Anterior Nasal Swab     Status: None   Collection Time: 12/30/22  8:17 PM   Specimen: Anterior Nasal Swab  Result Value Ref Range Status   SARS Coronavirus 2 by RT PCR NEGATIVE  NEGATIVE Final   Influenza A by PCR  NEGATIVE NEGATIVE Final   Influenza B by PCR NEGATIVE NEGATIVE Final    Comment: (NOTE) The Xpert Xpress SARS-CoV-2/FLU/RSV plus assay is intended as an aid in the diagnosis of influenza from Nasopharyngeal swab specimens and should not be used as a sole basis for treatment. Nasal washings and aspirates are unacceptable for Xpert Xpress SARS-CoV-2/FLU/RSV testing.  Fact Sheet for Patients: EntrepreneurPulse.com.au  Fact Sheet for Healthcare Providers: IncredibleEmployment.be  This test is not yet approved or cleared by the [place] FDA and has been authorized for detection and/or diagnosis of SARS-CoV-2 by FDA under an Emergency Use Authorization (EUA). This EUA will remain in effect (meaning this test can be used) for the duration of the COVID-19 declaration under Section 564(b)(1) of the Act, 21 U.S.C. section 360bbb-3(b)(1), unless the authorization is terminated or revoked.     Resp Syncytial Virus by PCR NEGATIVE NEGATIVE Final    Comment: (NOTE) Fact Sheet for Patients: EntrepreneurPulse.com.au  Fact Sheet for Healthcare Providers: IncredibleEmployment.be  This test is not yet approved or cleared by the [place] FDA and has been authorized for detection and/or diagnosis of SARS-CoV-2 by FDA under an Emergency Use Authorization (EUA). This EUA will remain in effect (meaning this test can be used) for the duration of the COVID-19 declaration under Section 564(b)(1) of the Act, 21 U.S.C. section 360bbb-3(b)(1), unless the authorization is terminated or revoked.  Performed at [facility], [place] [address] [place], [place] [address]      Time coordinating discharge: Over 30 minutes  SIGNED:   Tikesha Mort J [place] DO  [facility] 01/02/2023, 11:15 AM

## 2023-01-03 ENCOUNTER — Telehealth: Payer: Self-pay

## 2023-01-03 NOTE — Transitions of Care (Post Inpatient/ED Visit) (Signed)
   01/03/2023  Name: LAKEN ROG MRN: [id number] DOB: May 17, 1966  Today's TOC FU Call Status: Today's TOC FU Call Status:: Unsuccessul Call (1st Attempt) Unsuccessful Call (1st Attempt) Date: 01/03/23  Attempted to reach the patient regarding the most recent Inpatient/ED visit.  Follow Up Plan: Additional outreach attempts will be made to reach the patient to complete the Transitions of Care (Post Inpatient/ED visit) call.   Mickel Fuchs, BSW, [place] Managed Medicaid Team  [phone]

## 2023-01-04 ENCOUNTER — Telehealth: Payer: Self-pay

## 2023-01-04 NOTE — Transitions of Care (Post Inpatient/ED Visit) (Signed)
   01/04/2023  Name: TALAYSIA PINHEIRO MRN: [id number] DOB: 1966/04/17  Today's TOC FU Call Status: Today's TOC FU Call Status:: Unsuccessful Call (2nd Attempt) Unsuccessful Call (2nd Attempt) Date: 01/04/23  Attempted to reach the patient regarding the most recent Inpatient/ED visit.  Follow Up Plan: Additional outreach attempts will be made to reach the patient to complete the Transitions of Care (Post Inpatient/ED visit) call.   Mickel Fuchs, BSW, [place] Managed Medicaid Team  [phone]

## 2023-01-31 ENCOUNTER — Encounter (HOSPITAL_COMMUNITY): Payer: Self-pay

## 2023-01-31 ENCOUNTER — Emergency Department (HOSPITAL_COMMUNITY): Payer: Medicaid Other

## 2023-01-31 ENCOUNTER — Inpatient Hospital Stay (HOSPITAL_COMMUNITY)
Admission: EM | Admit: 2023-01-31 | Discharge: 2023-02-03 | DRG: 190 | Disposition: A | Discharge status: Home / Self Care | Payer: Medicaid Other | Attending: Internal Medicine | Admitting: Internal Medicine

## 2023-01-31 ENCOUNTER — Other Ambulatory Visit: Payer: Self-pay

## 2023-01-31 DIAGNOSIS — E119 Type 2 diabetes mellitus without complications: Secondary | ICD-10-CM | POA: Diagnosis present

## 2023-01-31 DIAGNOSIS — I5032 Chronic diastolic (congestive) heart failure: Secondary | ICD-10-CM | POA: Diagnosis present

## 2023-01-31 DIAGNOSIS — R062 Wheezing: Secondary | ICD-10-CM | POA: Diagnosis not present

## 2023-01-31 DIAGNOSIS — J449 Chronic obstructive pulmonary disease, unspecified: Secondary | ICD-10-CM | POA: Diagnosis present

## 2023-01-31 DIAGNOSIS — J440 Chronic obstructive pulmonary disease with acute lower respiratory infection: Secondary | ICD-10-CM

## 2023-01-31 DIAGNOSIS — F17200 Nicotine dependence, unspecified, uncomplicated: Secondary | ICD-10-CM | POA: Diagnosis present

## 2023-01-31 DIAGNOSIS — Z7951 Long term (current) use of inhaled steroids: Secondary | ICD-10-CM

## 2023-01-31 DIAGNOSIS — Z830 Family history of human immunodeficiency virus [HIV] disease: Secondary | ICD-10-CM

## 2023-01-31 DIAGNOSIS — Z96652 Presence of left artificial knee joint: Secondary | ICD-10-CM | POA: Diagnosis present

## 2023-01-31 DIAGNOSIS — E785 Hyperlipidemia, unspecified: Secondary | ICD-10-CM | POA: Diagnosis present

## 2023-01-31 DIAGNOSIS — J441 Chronic obstructive pulmonary disease with (acute) exacerbation: Principal | ICD-10-CM

## 2023-01-31 DIAGNOSIS — B3731 Acute candidiasis of vulva and vagina: Secondary | ICD-10-CM | POA: Diagnosis present

## 2023-01-31 DIAGNOSIS — Z7982 Long term (current) use of aspirin: Secondary | ICD-10-CM

## 2023-01-31 DIAGNOSIS — G4730 Sleep apnea, unspecified: Secondary | ICD-10-CM | POA: Diagnosis present

## 2023-01-31 DIAGNOSIS — J45901 Unspecified asthma with (acute) exacerbation: Secondary | ICD-10-CM | POA: Diagnosis present

## 2023-01-31 DIAGNOSIS — R0602 Shortness of breath: Secondary | ICD-10-CM | POA: Diagnosis not present

## 2023-01-31 DIAGNOSIS — J4551 Severe persistent asthma with (acute) exacerbation: Secondary | ICD-10-CM

## 2023-01-31 DIAGNOSIS — J9601 Acute respiratory failure with hypoxia: Secondary | ICD-10-CM | POA: Diagnosis not present

## 2023-01-31 DIAGNOSIS — I110 Hypertensive heart disease with heart failure: Secondary | ICD-10-CM | POA: Diagnosis present

## 2023-01-31 DIAGNOSIS — Z8249 Family history of ischemic heart disease and other diseases of the circulatory system: Secondary | ICD-10-CM

## 2023-01-31 DIAGNOSIS — K219 Gastro-esophageal reflux disease without esophagitis: Secondary | ICD-10-CM | POA: Diagnosis present

## 2023-01-31 DIAGNOSIS — Z79899 Other long term (current) drug therapy: Secondary | ICD-10-CM

## 2023-01-31 DIAGNOSIS — Z1152 Encounter for screening for COVID-19: Secondary | ICD-10-CM

## 2023-01-31 DIAGNOSIS — Z5900 Homelessness unspecified: Secondary | ICD-10-CM

## 2023-01-31 DIAGNOSIS — F32A Depression, unspecified: Secondary | ICD-10-CM | POA: Diagnosis present

## 2023-01-31 DIAGNOSIS — R079 Chest pain, unspecified: Secondary | ICD-10-CM | POA: Diagnosis not present

## 2023-01-31 DIAGNOSIS — I10 Essential (primary) hypertension: Secondary | ICD-10-CM | POA: Diagnosis not present

## 2023-01-31 DIAGNOSIS — F419 Anxiety disorder, unspecified: Secondary | ICD-10-CM | POA: Diagnosis present

## 2023-01-31 DIAGNOSIS — Z825 Family history of asthma and other chronic lower respiratory diseases: Secondary | ICD-10-CM

## 2023-01-31 LAB — CBC
HCT: 40 % (ref 36.0–46.0)
Hemoglobin: 12.8 g/dL (ref 12.0–15.0)
MCH: 27.5 pg (ref 26.0–34.0)
MCHC: 32 g/dL (ref 30.0–36.0)
MCV: 86 fL (ref 80.0–100.0)
Platelets: 319 10*3/uL (ref 150–400)
RBC: 4.65 MIL/uL (ref 3.87–5.11)
RDW: 18.6 % — ABNORMAL HIGH (ref 11.5–15.5)
WBC: 8.9 10*3/uL (ref 4.0–10.5)
nRBC: 0 % (ref 0.0–0.2)

## 2023-01-31 LAB — BASIC METABOLIC PANEL
Anion gap: 9 (ref 5–15)
BUN: 9 mg/dL (ref 6–20)
CO2: 25 mmol/L (ref 22–32)
Calcium: 8.2 mg/dL — ABNORMAL LOW (ref 8.9–10.3)
Chloride: 105 mmol/L (ref 98–111)
Creatinine, Ser: 0.83 mg/dL (ref 0.44–1.00)
GFR, Estimated: >60 mL/min (ref >60)
Glucose, Bld: 121 mg/dL — ABNORMAL HIGH (ref 70–99)
Potassium: 3.6 mmol/L (ref 3.5–5.1)
Sodium: 139 mmol/L (ref 135–145)

## 2023-01-31 LAB — RESP PANEL BY RT-PCR (RSV, FLU A&B, COVID)  RVPGX2
Influenza A by PCR: NEGATIVE
Influenza B by PCR: NEGATIVE
Resp Syncytial Virus by PCR: NEGATIVE
SARS Coronavirus 2 by RT PCR: NEGATIVE

## 2023-01-31 LAB — TROPONIN I (HIGH SENSITIVITY): Troponin I (High Sensitivity): 8 ng/L (ref <18)

## 2023-01-31 LAB — CBG MONITORING, ED: Glucose-Capillary: 273 mg/dL — ABNORMAL HIGH (ref 70–99)

## 2023-01-31 MED ORDER — METHYLPREDNISOLONE SODIUM SUCC 125 MG IJ SOLR: 125.0000 mg | Freq: Once | INTRAMUSCULAR | Status: DC

## 2023-01-31 MED ORDER — ALBUTEROL SULFATE HFA 108 (90 BASE) MCG/ACT IN AERS: 2.0000 | INHALATION_SPRAY | Freq: Four times a day (QID) | RESPIRATORY_TRACT | Status: DC | PRN

## 2023-01-31 MED ORDER — ASPIRIN 81 MG PO TBEC
81.0000 mg | DELAYED_RELEASE_TABLET | Freq: Every day | ORAL | Status: DC
  Administered 2023-02-01 – 2023-02-03 (×3): 81 mg via ORAL
  Filled 2023-01-31 (×3): qty 1

## 2023-01-31 MED ORDER — BENZONATATE 100 MG PO CAPS: 200.0000 mg | ORAL_CAPSULE | Freq: Three times a day (TID) | ORAL | Status: DC | PRN

## 2023-01-31 MED ORDER — FLUTICASONE PROPIONATE 50 MCG/ACT NA SUSP
1.0000 | Freq: Every day | NASAL | Status: DC | PRN
  Filled 2023-01-31: qty 16

## 2023-01-31 MED ORDER — ENOXAPARIN SODIUM 40 MG/0.4ML IJ SOSY
40.0000 mg | PREFILLED_SYRINGE | INTRAMUSCULAR | Status: DC
  Administered 2023-01-31 – 2023-02-02 (×3): 40 mg via SUBCUTANEOUS
  Filled 2023-01-31 (×3): qty 0.4

## 2023-01-31 MED ORDER — METHYLPREDNISOLONE SODIUM SUCC 40 MG IJ SOLR
40.0000 mg | Freq: Two times a day (BID) | INTRAMUSCULAR | Status: AC
  Administered 2023-01-31 – 2023-02-02 (×4): 40 mg via INTRAVENOUS
  Filled 2023-01-31 (×5): qty 1

## 2023-01-31 MED ORDER — IPRATROPIUM-ALBUTEROL 0.5-2.5 (3) MG/3ML IN SOLN
3.0000 mL | Freq: Four times a day (QID) | RESPIRATORY_TRACT | Status: DC | PRN
  Administered 2023-01-31 – 2023-02-01 (×2): 3 mL via RESPIRATORY_TRACT
  Filled 2023-01-31 (×2): qty 3

## 2023-01-31 MED ORDER — CLONIDINE HCL 0.2 MG PO TABS
0.2000 mg | ORAL_TABLET | Freq: Two times a day (BID) | ORAL | Status: DC
  Administered 2023-01-31 – 2023-02-03 (×6): 0.2 mg via ORAL
  Filled 2023-01-31 (×6): qty 1

## 2023-01-31 MED ORDER — ESCITALOPRAM OXALATE 10 MG PO TABS
10.0000 mg | ORAL_TABLET | Freq: Every day | ORAL | Status: DC
  Administered 2023-02-01 – 2023-02-03 (×3): 10 mg via ORAL
  Filled 2023-01-31 (×3): qty 1

## 2023-01-31 MED ORDER — GABAPENTIN 300 MG PO CAPS: 300.0000 mg | ORAL_CAPSULE | Freq: Three times a day (TID) | ORAL | Status: DC

## 2023-01-31 MED ORDER — BUDESON-GLYCOPYRROL-FORMOTEROL 160-9-4.8 MCG/ACT IN AERO: 2.0000 | INHALATION_SPRAY | Freq: Two times a day (BID) | RESPIRATORY_TRACT | Status: DC

## 2023-01-31 MED ORDER — ALBUTEROL SULFATE HFA 108 (90 BASE) MCG/ACT IN AERS: 2.0000 | INHALATION_SPRAY | Freq: Four times a day (QID) | RESPIRATORY_TRACT | 2 refills | Status: DC | PRN

## 2023-01-31 MED ORDER — LEVALBUTEROL TARTRATE 45 MCG/ACT IN AERO: 2.0000 | INHALATION_SPRAY | Freq: Four times a day (QID) | RESPIRATORY_TRACT | Status: DC | PRN

## 2023-01-31 MED ORDER — ACETAMINOPHEN 325 MG PO TABS: 650.0000 mg | ORAL_TABLET | Freq: Four times a day (QID) | ORAL | Status: DC | PRN

## 2023-01-31 MED ORDER — AZITHROMYCIN 250 MG PO TABS
500.0000 mg | ORAL_TABLET | Freq: Every day | ORAL | Status: DC
  Administered 2023-02-01 – 2023-02-03 (×3): 500 mg via ORAL
  Filled 2023-01-31 (×3): qty 2

## 2023-01-31 MED ORDER — ACETAMINOPHEN 650 MG RE SUPP: 650.0000 mg | Freq: Four times a day (QID) | RECTAL | Status: DC | PRN

## 2023-01-31 MED ORDER — MONTELUKAST SODIUM 10 MG PO TABS
10.0000 mg | ORAL_TABLET | Freq: Every day | ORAL | Status: DC
  Administered 2023-01-31 – 2023-02-02 (×3): 10 mg via ORAL
  Filled 2023-01-31 (×3): qty 1

## 2023-01-31 MED ORDER — SODIUM CHLORIDE 0.9 % IV SOLN
500.0000 mg | INTRAVENOUS | Status: AC
  Administered 2023-01-31: 500 mg via INTRAVENOUS
  Filled 2023-01-31: qty 5

## 2023-01-31 MED ORDER — PREDNISONE 20 MG PO TABS
40.0000 mg | ORAL_TABLET | Freq: Every day | ORAL | Status: DC
  Administered 2023-02-03: 40 mg via ORAL
  Filled 2023-01-31: qty 2

## 2023-01-31 MED ORDER — BUDESONIDE 0.5 MG/2ML IN SUSP
2.0000 mg | Freq: Two times a day (BID) | RESPIRATORY_TRACT | Status: DC
  Administered 2023-01-31 – 2023-02-03 (×6): 2 mg via RESPIRATORY_TRACT
  Filled 2023-01-31 (×6): qty 8

## 2023-01-31 MED ORDER — IPRATROPIUM BROMIDE 0.02 % IN SOLN: 1.5000 mg | Freq: Once | RESPIRATORY_TRACT | Status: DC

## 2023-01-31 MED ORDER — MAGNESIUM SULFATE 2 GM/50ML IV SOLN
2.0000 g | Freq: Once | INTRAVENOUS | Status: AC
  Administered 2023-01-31: 2 g via INTRAVENOUS
  Filled 2023-01-31: qty 50

## 2023-01-31 MED ORDER — PANTOPRAZOLE SODIUM 40 MG PO TBEC
40.0000 mg | DELAYED_RELEASE_TABLET | Freq: Every day | ORAL | Status: DC
  Administered 2023-02-01 – 2023-02-03 (×3): 40 mg via ORAL
  Filled 2023-01-31 (×3): qty 1

## 2023-01-31 MED ORDER — FLUCONAZOLE 150 MG PO TABS
150.0000 mg | ORAL_TABLET | Freq: Once | ORAL | Status: AC
  Administered 2023-01-31: 150 mg via ORAL
  Filled 2023-01-31: qty 1

## 2023-01-31 MED ORDER — ALBUTEROL (5 MG/ML) CONTINUOUS INHALATION SOLN
10.0000 mg/h | INHALATION_SOLUTION | Freq: Once | RESPIRATORY_TRACT | Status: AC
  Administered 2023-01-31: 10 mg/h via RESPIRATORY_TRACT
  Filled 2023-01-31: qty 20

## 2023-01-31 MED ORDER — IPRATROPIUM BROMIDE 0.02 % IN SOLN
0.5000 mg | Freq: Once | RESPIRATORY_TRACT | Status: AC
  Administered 2023-01-31: 0.5 mg via RESPIRATORY_TRACT
  Filled 2023-01-31: qty 2.5

## 2023-01-31 NOTE — ED Provider Notes (Signed)
Patient signed out to me at 3:30 PM.  Here with what is suspected to be asthma/COPD exacerbation.  Having cough and shortness of breath the last couple days, home albuterol treatments with not much improvement.  Hypoxic on room air.  She has been on 2 L of oxygen.  She did get epinephrine, Solu-Medrol and 2 DuoNeb's with EMS.  Sounds like may be magnesium then again in her system because IV was not working at that time.  Increased work of breathing and placed on a continuous breathing treatment at this time.  Will reevaluate but anticipate possibly an observation stay given the intensity of her symptoms and hypoxia which is new.  She continues to smoke intermittently she states.  She denies any fevers or chills otherwise.  She suspects that it is from pollen, allergy, environment.  She has had EKG, troponin, basic labs done that are all unremarkable.  Chest x-ray with no evidence of pneumonia.  Overall patient improving but still think it be reasonable to admit her for observation given her increased work of breathing, hypoxia.  Think we can start to space her out on breathing treatments and avoid any BiPAP at this time.  This chart was dictated using voice recognition software.  Despite best efforts to proofread,  errors can occur which can change the documentation meaning.    Virgina Norfolk, DO 01/31/23 1643

## 2023-01-31 NOTE — H&P (Signed)
History and Physical    Kathleen Rodriguez PCH:[id number] DOB: [birth year] DOA: 01/31/2023  PCP: Hoy Register, MD (Confirm with patient/family/NH records and if not entered, this has to be entered at [facility] point of entry) Patient coming from: Home  I have personally briefly reviewed patient's old medical records in [facility]  Chief Complaint: Cough, wheezing, SOB  HPI: Kathleen Rodriguez is a 57 y.o. female with medical history significant of COPD Gold stage II, HTN, OSA on CPAP presented with worsening of wheezing cough shortness of breath.  Symptoms started 3 days ago initially was dry cough then soon patient started to wheeze and developed increasing exertional dyspnea denies chest pain no fever or chills.  Today, even at rest she could not catch her breath and decided to come to ED.  Patient also complained about vaginal Candida infection with severe itchiness.   ED Course: Was found to be tachycardia tachypneic, initially hypoxic with O2 saturation 86% requiring 10 L oxygen, after IV Solu-Medrol and magnesium and continuous albuterol treatment for 1 hour, O2 saturation 100% on 2 L however continue to have tachycardia and tachypneic and talking in broken sentences.  Chest x-ray no acute infiltrates.  Review of Systems: As per HPI otherwise 14 point review of systems negative.   Past Medical History:  Diagnosis Date   Arthritis    "knees, lower back; legs, ankles" (01/27/2016)   Asthma    followed by Dr. 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   Diabetes mellitus without complication    GERD (gastroesophageal reflux disease)    Hypertension    "doctor took me off RX in 2016" (01/27/2016)   Hypertension    Influenza B 01/2013   Complicated by multi-organ failure   OSA on CPAP "since " 03/20/2013   Pneumonia 2016   Required emergent intubation    asthma exacerbation in 2016   Skin ulcer  (HCC) secondary to bullous impetigo [name] 08/29/2018   Sleep apnea    Tobacco abuse    Upper airway cough syndrome     Past Surgical History:  Procedure Laterality Date   BREAST SURGERY Right 1980   as a teenager , cyst was benign   CESAREAN SECTION  2006   LACERATION REPAIR Right ~ 1997   "tried to cut myself"   TOTAL KNEE ARTHROPLASTY Left 09/01/2016   Procedure: LEFT TOTAL KNEE ARTHROPLASTY;  Surgeon: Tarry Kos, MD;  Location: [facility] OR;  Service: Orthopedics;  Laterality: Left;   TUBAL LIGATION  2006     reports that she quit smoking about 10 years ago. Her smoking use included cigarettes. She started smoking about 31 years ago. She has a 5.25 pack-year smoking history. She has never used smokeless tobacco. She reports that she does not currently use drugs after having used the following drugs: Cocaine and "Crack" cocaine. No history on file for alcohol use.  Allergies  Allergen Reactions   Tomato Hives, Itching and Other (See Comments)    ALSO REACTS TO KETCHUP   Latex Itching and Rash   Wool Alcohol [Lanolin] Itching    Family History  Problem Relation Age of Onset   Asthma Mother    Heart murmur Mother    Cancer Maternal Grandmother    Heart disease Maternal Grandmother    Hypertension Maternal Grandmother    Cancer Paternal Grandmother  Hypertension Mother    HIV/AIDS Father     Prior to Admission medications   Medication Sig Start Date End Date Taking? Authorizing Provider  albuterol [facility] HFA) 108 (90 Base) MCG/ACT inhaler Inhale 2 puffs into the lungs every 6 (six) hours as needed for wheezing. 01/02/23 04/02/23 Yes [place], Eric J, DO  albuterol (VENTOLIN HFA) 108 (90 Base) MCG/ACT inhaler Inhale 2 puffs into the lungs every 6 (six) hours as needed for wheezing or shortness of breath. 01/31/23  Yes Emeline General, MD  aspirin EC 81 MG tablet Take 1 tablet (81 mg total) by mouth daily. 01/02/23 04/02/23 Yes [place], Eric J, DO  Blood Glucose Monitoring Suppl  (ACCU-CHEK GUIDE ME) w/Device KIT Use to check blood sugar once daily. 05/04/21  Yes Hoy Register, MD  Budeson-Glycopyrrol-Formoterol (BREZTRI AEROSPHERE) 160-9-4.8 MCG/ACT AERO Inhale 2 puffs into the lungs in the morning and at bedtime. 01/02/23  Yes [place], Alvira Philips, DO  cloNIDine (CATAPRES) 0.2 MG tablet Take 1 tablet (0.2 mg total) by mouth 2 (two) times daily. For BP and hot flashes 01/02/23 04/02/23 Yes [place], Alvira Philips, DO  escitalopram (LEXAPRO) 10 MG tablet Take 1 tablet (10 mg total) by mouth daily. 01/03/23 04/03/23 Yes [place], Eric J, DO  fluticasone (FLONASE) 50 MCG/ACT nasal spray Place 1 spray into both nostrils daily as needed for allergies. 01/02/23 04/02/23 Yes [place], Eric J, DO  glucose blood (ACCU-CHEK GUIDE) test strip Use as instructed daily before breakfast. 10/05/21  Yes Newlin, [name], MD  ipratropium-albuterol (DUONEB) 0.5-2.5 (3) MG/3ML SOLN Take 3 mLs by nebulization every 6 (six) hours as needed (shortness of breath). 01/02/23 04/02/23 Yes [place], Eric J, DO  montelukast (SINGULAIR) 10 MG tablet Take 1 tablet (10 mg total) by mouth at bedtime. 01/02/23 04/02/23 Yes [place], Eric J, DO  pantoprazole (PROTONIX) 40 MG tablet Take 1 tablet (40 mg total) by mouth daily. 01/02/23 04/02/23 Yes [place], Eric J, DO  benzonatate (TESSALON) 200 MG capsule Take 1 capsule (200 mg total) by mouth 3 (three) times daily as needed for cough. Patient not taking: Reported on 01/31/2023 08/30/22   Kathlen Mody, MD  gabapentin (NEURONTIN) 300 MG capsule Take 1 capsule (300 mg total) by mouth 3 (three) times daily. Patient not taking: Reported on 01/31/2023 01/02/23 04/02/23  [place], Alvira Philips, DO  loratadine (CLARITIN) 10 MG tablet Take 1 tablet (10 mg total) by mouth daily. Patient not taking: Reported on 01/31/2023 01/02/23 04/02/23  [place], Eric J, [place]    Physical Exam: Vitals:   01/31/23 1600 01/31/23 1630 01/31/23 1712 01/31/23 1730  BP: (!) 176/83 (!) 161/84  (!) 151/62  Pulse: (!) 103 (!) 109  (!) 118   Resp: (!) 21 (!) 21  (!) 21  Temp:   97.9 F (36.6 C)   TempSrc:   Oral   SpO2: 100% 100%  (!) 86%  Weight:      Height:        Constitutional: NAD, calm, comfortable Vitals:   01/31/23 1600 01/31/23 1630 01/31/23 1712 01/31/23 1730  BP: (!) 176/83 (!) 161/84  (!) 151/62  Pulse: (!) 103 (!) 109  (!) 118  Resp: (!) 21 (!) 21  (!) 21  Temp:   97.9 F (36.6 C)   TempSrc:   Oral   SpO2: 100% 100%  (!) 86%  Weight:      Height:       Eyes: PERRL, lids and conjunctivae normal ENMT: Mucous membranes are moist. Posterior pharynx clear of any exudate  or lesions.Normal dentition.  Neck: normal, supple, no masses, no thyromegaly Respiratory: Diminished breathing sound bilaterally, diffused wheezing, scattered crackles, increasing breathing effort, talking in broken sentences. No accessory muscle use.  Cardiovascular: Regular rate and rhythm, no murmurs / rubs / gallops. No extremity edema. 2+ pedal pulses. No carotid bruits.  Abdomen: no tenderness, no masses palpated. No hepatosplenomegaly. Bowel sounds positive.  Musculoskeletal: no clubbing / cyanosis. No joint deformity upper and lower extremities. Good ROM, no contractures. Normal muscle tone.  Skin: no rashes, lesions, ulcers. No induration Neurologic: CN 2-12 grossly intact. Sensation intact, DTR normal. Strength 5/5 in all 4.  Psychiatric: Normal judgment and insight. Alert and oriented x 3. Normal mood.     Labs on Admission: I have personally reviewed following labs and imaging studies  CBC: Recent Labs  Lab 01/31/23 1329  WBC 8.9  HGB 12.8  HCT 40.0  MCV 86.0  PLT 319   Basic Metabolic Panel: Recent Labs  Lab 01/31/23 1329  NA 139  K 3.6  CL 105  CO2 25  GLUCOSE 121*  BUN 9  CREATININE 0.83  CALCIUM 8.2*   GFR: Estimated Creatinine Clearance: 81.7 mL/min (by C-G formula based on SCr of 0.83 mg/dL). Liver Function Tests: No results for input(s): "AST", "ALT", "ALKPHOS", "BILITOT", "PROT", "ALBUMIN" in  the last 168 hours. No results for input(s): "LIPASE", "AMYLASE" in the last 168 hours. No results for input(s): "AMMONIA" in the last 168 hours. Coagulation Profile: No results for input(s): "INR", "PROTIME" in the last 168 hours. Cardiac Enzymes: No results for input(s): "CKTOTAL", "CKMB", "CKMBINDEX", "TROPONINI" in the last 168 hours. BNP (last 3 results) No results for input(s): "PROBNP" in the last 8760 hours. HbA1C: No results for input(s): "HGBA1C" in the last 72 hours. CBG: No results for input(s): "GLUCAP" in the last 168 hours. Lipid Profile: No results for input(s): "CHOL", "HDL", "LDLCALC", "TRIG", "CHOLHDL", "LDLDIRECT" in the last 72 hours. Thyroid Function Tests: No results for input(s): "TSH", "T4TOTAL", "FREET4", "T3FREE", "THYROIDAB" in the last 72 hours. Anemia Panel: No results for input(s): "VITAMINB12", "FOLATE", "FERRITIN", "TIBC", "IRON", "RETICCTPCT" in the last 72 hours. Urine analysis:    Component Value Date/Time   COLORURINE YELLOW 05/29/2022 2230   APPEARANCEUR CLEAR 05/29/2022 2230   LABSPEC 1.023 05/29/2022 2230   PHURINE 5.0 05/29/2022 2230   GLUCOSEU NEGATIVE 05/29/2022 2230   GLUCOSEU NEGATIVE 02/10/2022 1130   HGBUR SMALL (A) 05/29/2022 2230   BILIRUBINUR NEGATIVE 05/29/2022 2230   BILIRUBINUR small 05/26/2017 1539   KETONESUR NEGATIVE 05/29/2022 2230   PROTEINUR NEGATIVE 05/29/2022 2230   UROBILINOGEN 0.2 02/10/2022 1130   NITRITE NEGATIVE 05/29/2022 2230   LEUKOCYTESUR NEGATIVE 05/29/2022 2230    Radiological Exams on Admission: DG Chest 2 View  Result Date: 01/31/2023 CLINICAL DATA:  Shortness of breath.  Central chest pain. EXAM: CHEST - 2 VIEW COMPARISON:  Chest radiographs 12/30/2022 and 08/27/2022 FINDINGS: Cardiac silhouette and mediastinal contours are within normal limits. The lungs are clear. No pleural effusion or pneumothorax. Mild-to-moderate multilevel degenerative disc changes of the thoracic spine. Unchanged posttraumatic  spurring at the coracoclavicular ligament insertions on the lateral clavicle with unchanged moderate clavicular head peripheral spurring and mild asymmetric widening of the right clavicular joint. IMPRESSION: 1. No active cardiopulmonary disease. 2. Unchanged posttraumatic changes of the right clavicle. Electronically Signed   By: Neita Garnet M.D.   On: 01/31/2023 13:59    EKG: Independently reviewed.  Sinus rhythm, no acute ST changes.  Assessment/Plan Principal Problem:   COPD (  chronic obstructive pulmonary disease) Active Problems:   Vaginal candida   Acute asthma exacerbation   Essential hypertension   Anxiety   Acute respiratory failure with hypoxia  (please populate well all problems here in Problem List. (For example, if patient is on BP meds at home and you resume or decide to hold them, it is a problem that needs to be her. Same for CAD, COPD, HLD and so on)  Acute hypoxic respiratory failure -Secondary to acute COPD exacerbation -Continue IV steroid x 1 then switch to p.o. prednisone tomorrow -Continue ICS, LABA, DuoNebs and as needed albuterol -Incentive spirometry -Short course of azithromycin -Ambulatory pulse ox/home O2 evaluation  Vaginal candidiasis -Fluconazole 150 mg [place]  HTN -Stable, continue clonidine   DVT prophylaxis: Lovenox Code Status: Full code Family Communication: None at bedside Disposition Plan: Expect less than 2 midnight hospital stay Consults called: None Admission status: Tele obs   Emeline GeneralPing T Grant Swager MD [facility] Pager [phone]  01/31/2023, 6:55 PM

## 2023-01-31 NOTE — ED Notes (Signed)
ED TO INPATIENT HANDOFF REPORT  ED Nurse Name and Phone #:  Gillis Ends #[phone]  S Name/Rodriguez/Gender Kathleen Rodriguez 57 y.o. female Room/Bed: 043C/043C  Code Status   Code Status: Full Code  Home/SNF/Other Home Patient oriented to: self, place, time, and situation Is this baseline? Yes   Triage Complete: Triage complete  Chief Complaint COPD (chronic obstructive pulmonary disease) [J44.9]  Triage Note Pt BIB [facility] from home d/t SOB for the past 3 days. Does have Hx of asthma & takes albuterol at home with no improvement. EMS gave 2 Duo nebs, 1 albuterol neb, 0.3 Epi, 125 Solumedrol & when attempting to give 2g Mag her IV blew. No PIV upon arrival to ED, A/Ox4, c/o 9/10 CP & SOB continues. Expiratory wheezing persists , cap was 44, 90% on RA, 200/100, 100 bpm NSR, 150 CGB.    Allergies Allergies  Allergen Reactions   Tomato Hives, Itching and Other (See Comments)    ALSO REACTS TO KETCHUP   Latex Itching and Rash   Wool Alcohol [Lanolin] Itching    Level of Care/Admitting Diagnosis ED Disposition     ED Disposition  Admit   Condition  --   Comment  Hospital Area: [facility] [[id number]]  Level of Care: Telemetry Medical [104]  May place patient in observation at [facility] or [facility] if equivalent level of care is available:: No  Covid Evaluation: Asymptomatic - no recent exposure (last 10 days) testing not required  Diagnosis: COPD (chronic obstructive pulmonary disease) [498264]  Admitting Physician: Emeline General [[id number]]  Attending Physician: Emeline General [[id number]]          B Medical/Surgery History Past Medical History:  Diagnosis Date   Arthritis    "knees, lower back; legs, ankles" (01/27/2016)   Asthma    followed by Dr. 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   Diabetes mellitus without complication    GERD (gastroesophageal reflux  disease)    Hypertension    "doctor took me off RX in 2016" (01/27/2016)   Hypertension    Influenza B 01/2013   Complicated by multi-organ failure   OSA on CPAP "since " 03/20/2013   Pneumonia 2016   Required emergent intubation    asthma exacerbation in 2016   Skin ulcer (HCC) secondary to bullous impetigo [name] 08/29/2018   Sleep apnea    Tobacco abuse    Upper airway cough syndrome    Past Surgical History:  Procedure Laterality Date   BREAST SURGERY Right 1980   as a teenager , cyst was benign   CESAREAN SECTION  2006   LACERATION REPAIR Right ~ 1997   "tried to cut myself"   TOTAL KNEE ARTHROPLASTY Left 09/01/2016   Procedure: LEFT TOTAL KNEE ARTHROPLASTY;  Surgeon: Tarry Kos, MD;  Location: [facility] OR;  Service: Orthopedics;  Laterality: Left;   TUBAL LIGATION  2006     A IV Location/Drains/Wounds Patient Lines/Drains/Airways Status     Active Line/Drains/Airways     Name Placement date Placement time Site Days   Peripheral IV 01/31/23 22 G Anterior;Left Hand 01/31/23  1303  Hand  less than 1            Intake/Output Last 24 hours No intake or output data in the 24 hours ending 01/31/23 1948  Labs/Imaging Results for orders placed or  performed during the hospital encounter of 01/31/23 (from the past 48 hour(s))  Basic metabolic panel     Status: Abnormal   Collection Time: 01/31/23  1:29 PM  Result Value Ref Range   Sodium 139 135 - 145 mmol/L   Potassium 3.6 3.5 - 5.1 mmol/L   Chloride 105 98 - 111 mmol/L   CO2 25 22 - 32 mmol/L   Glucose, Bld 121 (H) 70 - 99 mg/dL    Comment: Glucose reference range applies only to samples taken after fasting for at least 8 hours.   BUN 9 6 - 20 mg/dL   Creatinine, Ser 9.24 0.44 - 1.00 mg/dL   Calcium 8.2 (L) 8.9 - 10.3 mg/dL   GFR, Estimated >46 >28 mL/min    Comment: (NOTE) Calculated using the CKD-EPI Creatinine Equation (2021)    Anion gap 9 5 - 15    Comment: Performed at [facility], [address] [place], [place] [address]  CBC     Status: Abnormal   Collection Time: 01/31/23  1:29 PM  Result Value Ref Range   WBC 8.9 4.0 - 10.5 K/uL   RBC 4.65 3.87 - 5.11 MIL/uL   Hemoglobin 12.8 12.0 - 15.0 g/dL   HCT 71.1 65.7 - 90.3 %   MCV 86.0 80.0 - 100.0 fL   MCH 27.5 26.0 - 34.0 pg   MCHC 32.0 30.0 - 36.0 g/dL   RDW 83.3 (H) 38.3 - 29.1 %   Platelets 319 150 - 400 K/uL   nRBC 0.0 0.0 - 0.2 %    Comment: Performed at [facility], [address], [place], [place] [address]  Troponin I (High Sensitivity)     Status: None   Collection Time: 01/31/23  1:29 PM  Result Value Ref Range   Troponin I (High Sensitivity) 8 <18 ng/L    Comment: (NOTE) Elevated high sensitivity troponin I (hsTnI) values and significant  changes across serial measurements may suggest ACS but many other  chronic and acute conditions are known to elevate hsTnI results.  Refer to the "Links" section for chest pain algorithms and additional  guidance. Performed at [facility], [address], [place], [place] [address]   Resp panel by RT-PCR (RSV, Flu A&B, Covid) Anterior Nasal Swab     Status: None   Collection Time: 01/31/23  4:00 PM   Specimen: Anterior Nasal Swab  Result Value Ref Range   SARS Coronavirus 2 by RT PCR NEGATIVE NEGATIVE   Influenza A by PCR NEGATIVE NEGATIVE   Influenza B by PCR NEGATIVE NEGATIVE    Comment: (NOTE) The Xpert Xpress SARS-CoV-2/FLU/RSV plus assay is intended as an aid in the diagnosis of influenza from Nasopharyngeal swab specimens and should not be used as a sole basis for treatment. Nasal washings and aspirates are unacceptable for Xpert Xpress SARS-CoV-2/FLU/RSV testing.  Fact Sheet for Patients: BloggerCourse.com  Fact Sheet for Healthcare Providers: SeriousBroker.it  This test is not yet approved or cleared by the [place] FDA and has been authorized for detection and/or diagnosis of SARS-CoV-2  by FDA under an Emergency Use Authorization (EUA). This EUA will remain in effect (meaning this test can be used) for the duration of the COVID-19 declaration under Section 564(b)(1) of the Act, 21 U.S.C. section 360bbb-3(b)(1), unless the authorization is terminated or revoked.     Resp Syncytial Virus by PCR NEGATIVE NEGATIVE    Comment: (NOTE) Fact Sheet for Patients: BloggerCourse.com  Fact Sheet  for Healthcare Providers: SeriousBroker.ithttps://www.fda.gov/media/152162/download  This test is not yet approved or cleared by the [place] FDA and has been authorized for detection and/or diagnosis of SARS-CoV-2 by FDA under an Emergency Use Authorization (EUA). This EUA will remain in effect (meaning this test can be used) for the duration of the COVID-19 declaration under Section 564(b)(1) of the Act, 21 U.S.C. section 360bbb-3(b)(1), unless the authorization is terminated or revoked.  Performed at [facility], [address] [place], [place] [id number]    DG Chest 2 View  Result Date: 01/31/2023 CLINICAL DATA:  Shortness of breath.  Central chest pain. EXAM: CHEST - 2 VIEW COMPARISON:  Chest radiographs 12/30/2022 and 08/27/2022 FINDINGS: Cardiac silhouette and mediastinal contours are within normal limits. The lungs are clear. No pleural effusion or pneumothorax. Mild-to-moderate multilevel degenerative disc changes of the thoracic spine. Unchanged posttraumatic spurring at the coracoclavicular ligament insertions on the lateral clavicle with unchanged moderate clavicular head peripheral spurring and mild asymmetric widening of the right clavicular joint. IMPRESSION: 1. No active cardiopulmonary disease. 2. Unchanged posttraumatic changes of the right clavicle. Electronically Signed   By: Neita Garnetonald  Viola M.D.   On: 01/31/2023 13:59    Pending Labs Unresulted Labs (From admission, onward)    None       Vitals/Pain Today's Vitals   01/31/23 1712 01/31/23  1730 01/31/23 1900 01/31/23 1930  BP:  (!) 151/62 (!) 164/74 (!) 153/86  Pulse:  (!) 118 (!) 118 (!) 118  Resp:  (!) 21 (!) 25 (!) 25  Temp: 97.9 F (36.6 C)     TempSrc: Oral     SpO2:  (!) 86% 93% 95%  Weight:      Height:      PainSc:        Isolation Precautions No active isolations  Medications Medications  fluconazole (DIFLUCAN) tablet 150 mg (has no administration in time range)  aspirin EC tablet 81 mg (has no administration in time range)  cloNIDine (CATAPRES) tablet 0.2 mg (has no administration in time range)  escitalopram (LEXAPRO) tablet 10 mg (has no administration in time range)  pantoprazole (PROTONIX) EC tablet 40 mg (has no administration in time range)  fluticasone (FLONASE) 50 MCG/ACT nasal spray 1 spray (has no administration in time range)  ipratropium-albuterol (DUONEB) 0.5-2.5 (3) MG/3ML nebulizer solution 3 mL (has no administration in time range)  montelukast (SINGULAIR) tablet 10 mg (has no administration in time range)  azithromycin (ZITHROMAX) 500 mg in sodium chloride 0.9 % 250 mL IVPB (has no administration in time range)    Followed by  azithromycin (ZITHROMAX) tablet 500 mg (has no administration in time range)  budesonide (PULMICORT) nebulizer solution 2 mg (has no administration in time range)  methylPREDNISolone sodium succinate (SOLU-MEDROL) 40 mg/mL injection 40 mg (has no administration in time range)    Followed by  predniSONE (DELTASONE) tablet 40 mg (has no administration in time range)  enoxaparin (LOVENOX) injection 40 mg (has no administration in time range)  acetaminophen (TYLENOL) tablet 650 mg (has no administration in time range)    Or  acetaminophen (TYLENOL) suppository 650 mg (has no administration in time range)  albuterol (PROVENTIL,VENTOLIN) solution continuous neb (10 mg/hr Nebulization Given 01/31/23 1526)  magnesium sulfate IVPB 2 g 50 mL (0 g Intravenous Stopped 01/31/23 1634)  ipratropium (ATROVENT) nebulizer solution 0.5  mg (0.5 mg Nebulization Given 01/31/23 1453)    Mobility walks     Focused Assessments     R Recommendations: See Admitting  Provider Note  Report given to:   Additional Notes:

## 2023-01-31 NOTE — Plan of Care (Signed)
  Problem: Education: Goal: Knowledge of General Education information will improve Description: Including pain rating scale, medication(s)/side effects and non-pharmacologic comfort measures Outcome: Progressing   Problem: Clinical Measurements: Goal: Respiratory complications will improve Outcome: Progressing   

## 2023-01-31 NOTE — ED Provider Notes (Signed)
[place] EMERGENCY DEPARTMENT AT [facility] Provider Note   CSN: [id number] Arrival date & time: 01/31/23  1246     History  Chief Complaint  Patient presents with   SOB    Kathleen Rodriguez is a 57 y.o. female.  HPI     57 year old female with a history of asthma/COPD, chronic diastolic congestive heart failure, OSA on CPAP, history of critical illness myopathy, type 2 diabetes, hypertension, GERD, history of cocaine abuse, admission in March for acute COPD exacerbation, who presents with concern for shortness of breath.  Reports 3 days of increasing symptoms of dyspnea. Has some associated dry cough, not significant coughing however.  Has had nasal congestion. Feels COPD is triggered by seasonal allergies, construction happening on building next door with dust.  Has significant wheezing. Reports some chest discomfort from coughing, feeling of COPD.  Denies leg pain or swelling. No hx of dvt/PE/recent surgery/cancer/immobilization.  No fever. No abdominal pain. Reports when Kathleen Rodriguez felt Kathleen Rodriguez needed breathing treatment to get up to go to bathroom Kathleen Rodriguez felt Kathleen Rodriguez needed to come to the ED.     Past Medical History:  Diagnosis Date   Arthritis    "knees, lower back; legs, ankles" (01/27/2016)   Asthma    followed by Dr. 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   Diabetes mellitus without complication    GERD (gastroesophageal reflux disease)    Hypertension    "doctor took me off RX in 2016" (01/27/2016)   Hypertension    Influenza B 01/2013   Complicated by multi-organ failure   OSA on CPAP "since " 03/20/2013   Pneumonia 2016   Required emergent intubation    asthma exacerbation in 2016   Skin ulcer (HCC) secondary to bullous impetigo /trauma 08/29/2018   Sleep apnea    Tobacco abuse    Upper airway cough syndrome      Home Medications Prior to Admission medications    Medication Sig Start Date End Date Taking? Authorizing Provider  albuterol [facility] HFA) 108 (90 Base) MCG/ACT inhaler Inhale 2 puffs into the lungs every 6 (six) hours as needed for wheezing. 01/02/23 04/02/23 Yes [place], Eric J, DO  albuterol (VENTOLIN HFA) 108 (90 Base) MCG/ACT inhaler Inhale 2 puffs into the lungs every 6 (six) hours as needed for wheezing or shortness of breath. 01/31/23  Yes Emeline General, MD  aspirin EC 81 MG tablet Take 1 tablet (81 mg total) by mouth daily. 01/02/23 04/02/23 Yes [place], Eric J, DO  Blood Glucose Monitoring Suppl (ACCU-CHEK GUIDE ME) w/Device KIT Use to check blood sugar once daily. 05/04/21  Yes Hoy Register, MD  Budeson-Glycopyrrol-Formoterol (BREZTRI AEROSPHERE) 160-9-4.8 MCG/ACT AERO Inhale 2 puffs into the lungs in the morning and at bedtime. 01/02/23  Yes [place], Alvira Philips, DO  cloNIDine (CATAPRES) 0.2 MG tablet Take 1 tablet (0.2 mg total) by mouth 2 (two) times daily. For BP and hot flashes 01/02/23 04/02/23 Yes [place], Alvira Philips, DO  escitalopram (LEXAPRO) 10 MG tablet Take 1 tablet (10 mg total) by mouth daily. 01/03/23 04/03/23 Yes [place], Eric J, DO  fluticasone (FLONASE) 50 MCG/ACT nasal spray Place 1 spray into both nostrils daily as needed for allergies. 01/02/23 04/02/23 Yes [place], Eric J, DO  glucose blood (ACCU-CHEK GUIDE) test strip Use as instructed daily before breakfast. 10/05/21  Yes Hoy Register, MD  ipratropium-albuterol (DUONEB) 0.5-2.5 (3) MG/3ML SOLN Take 3 mLs by nebulization every 6 (six) hours as needed (shortness of breath). 01/02/23 04/02/23 Yes [place], Eric J, DO  montelukast (SINGULAIR) 10 MG tablet Take 1 tablet (10 mg total) by mouth at bedtime. 01/02/23 04/02/23 Yes [place], Eric J, DO  pantoprazole (PROTONIX) 40 MG tablet Take 1 tablet (40 mg total) by mouth daily. 01/02/23 04/02/23 Yes [place], Eric J, DO  benzonatate (TESSALON) 200 MG capsule Take 1 capsule (200 mg total) by mouth 3 (three) times daily as needed for cough. Patient not  taking: Reported on 01/31/2023 08/30/22   Kathlen Mody, MD  gabapentin (NEURONTIN) 300 MG capsule Take 1 capsule (300 mg total) by mouth 3 (three) times daily. Patient not taking: Reported on 01/31/2023 01/02/23 04/02/23  [place], Alvira Philips, DO  loratadine (CLARITIN) 10 MG tablet Take 1 tablet (10 mg total) by mouth daily. Patient not taking: Reported on 01/31/2023 01/02/23 04/02/23  [place], Alvira Philips, DO      Allergies    Tomato, Latex, and Wool alcohol [lanolin]    Review of Systems   Review of Systems  Physical Exam Updated Vital Signs BP (!) 166/74 (BP Location: Right Arm)   Pulse (!) 118   Temp 98.9 F (37.2 C) (Oral)   Resp 20   Ht 5\' 5"  (1.651 m)   Wt 93.1 kg   SpO2 98%   BMI 34.16 kg/m  Physical Exam Vitals and nursing note reviewed.  Constitutional:      General: Kathleen Rodriguez is not in acute distress.    Appearance: Kathleen Rodriguez is well-developed. Kathleen Rodriguez is not diaphoretic.  HENT:     Head: Normocephalic and atraumatic.  Eyes:     Conjunctiva/sclera: Conjunctivae normal.  Cardiovascular:     Rate and Rhythm: Regular rhythm. Tachycardia present.     Heart sounds: Normal heart sounds. No murmur heard.    No friction rub. No gallop.  Pulmonary:     Effort: Pulmonary effort is normal. No respiratory distress.     Breath sounds: Wheezing present. No rales.  Abdominal:     General: There is no distension.     Palpations: Abdomen is soft.     Tenderness: There is no abdominal tenderness. There is no guarding.  Musculoskeletal:        General: No tenderness.     Cervical back: Normal range of motion.  Skin:    General: Skin is warm and dry.     Findings: No erythema or rash.  Neurological:     Mental Status: Kathleen Rodriguez is alert and oriented to person, place, and time.     ED Results / Procedures / Treatments   Labs (all labs ordered are listed, but only abnormal results are displayed) Labs Reviewed  BASIC METABOLIC PANEL - Abnormal; Notable for the following components:      Result Value   Glucose,  Bld 121 (*)    Calcium 8.2 (*)    All other components within normal limits  CBC - Abnormal; Notable for the following components:   RDW 18.6 (*)    All other components within normal limits  CBG MONITORING, ED - Abnormal; Notable for the following components:   Glucose-Capillary 273 (*)    All other components within normal limits  RESP PANEL BY RT-PCR (RSV, FLU A&B, COVID)  [id number]  TROPONIN I (HIGH SENSITIVITY)    EKG EKG Interpretation  Date/Time:  Tuesday January 31 2023 12:56:07 EDT Ventricular Rate:  110 PR Interval:  116 QRS Duration:  88 QT Interval:  334 QTC Calculation: 452 R Axis:   81 Text Interpretation: Sinus tachycardia Atrial premature complex Biatrial enlargement Probable left ventricular hypertrophy No significant change since last tracing Confirmed by Alvira Monday ([id number]) on 01/31/2023 1:16:54 PM  Radiology DG Chest 2 View  Result Date: 01/31/2023 CLINICAL DATA:  Shortness of breath.  Central chest pain. EXAM: CHEST - 2 VIEW COMPARISON:  Chest radiographs 12/30/2022 and 08/27/2022 FINDINGS: Cardiac silhouette and mediastinal contours are within normal limits. The lungs are clear. No pleural effusion or pneumothorax. Mild-to-moderate multilevel degenerative disc changes of the thoracic spine. Unchanged posttraumatic spurring at the coracoclavicular ligament insertions on the lateral clavicle with unchanged moderate clavicular head peripheral spurring and mild asymmetric widening of the right clavicular joint. IMPRESSION: 1. No active cardiopulmonary disease. 2. Unchanged posttraumatic changes of the right clavicle. Electronically Signed   By: Neita Garnet M.D.   On: 01/31/2023 13:59    Procedures Procedures    Medications Ordered in ED Medications  aspirin EC tablet 81 mg (has no administration in time range)  cloNIDine (CATAPRES) tablet 0.2 mg (0.2 mg Oral Given 01/31/23 2258)  escitalopram (LEXAPRO) tablet 10 mg (has no administration in time range)   pantoprazole (PROTONIX) EC tablet 40 mg (has no administration in time range)  fluticasone (FLONASE) 50 MCG/ACT nasal spray 1 spray (has no administration in time range)  ipratropium-albuterol (DUONEB) 0.5-2.5 (3) MG/3ML nebulizer solution 3 mL (has no administration in time range)  montelukast (SINGULAIR) tablet 10 mg (10 mg Oral Given 01/31/23 2258)  azithromycin (ZITHROMAX) 500 mg in sodium chloride 0.9 % 250 mL IVPB (0 mg Intravenous Stopped 01/31/23 2251)    Followed by  azithromycin (ZITHROMAX) tablet 500 mg (has no administration in time range)  budesonide (PULMICORT) nebulizer solution 2 mg (2 mg Nebulization Given 01/31/23 2049)  methylPREDNISolone sodium succinate (SOLU-MEDROL) 40 mg/mL injection 40 mg (40 mg Intravenous Given 01/31/23 2258)    Followed by  predniSONE (DELTASONE) tablet 40 mg (has no administration in time range)  enoxaparin (LOVENOX) injection 40 mg (40 mg Subcutaneous Given 01/31/23 1956)  acetaminophen (TYLENOL) tablet 650 mg (has no administration in time range)    Or  acetaminophen (TYLENOL) suppository 650 mg (has no administration in time range)  albuterol (PROVENTIL,VENTOLIN) solution continuous neb (10 mg/hr Nebulization Given 01/31/23 1526)  magnesium sulfate IVPB 2 g 50 mL (0 g Intravenous Stopped 01/31/23 1634)  ipratropium (ATROVENT) nebulizer solution 0.5 mg (0.5 mg Nebulization Given 01/31/23 1453)  fluconazole (DIFLUCAN) tablet 150 mg (150 mg Oral Given 01/31/23 2012)    ED Course/ Medical Decision Making/ A&P                               57 year old female with a history of asthma/COPD, chronic diastolic congestive heart failure, OSA on CPAP, history of critical illness myopathy, type 2 diabetes, hypertension, GERD, history of cocaine abuse, admission in March for acute COPD exacerbation, who presents with concern for shortness of breath.  Differential diagnosis for dyspnea includes ACS, PE, COPD exacerbation, CHF exacerbation, anemia, pneumonia, viral  etiology such as COVID 19 infection, metabolic abnormality.    Chest x-ray was done which showed no acute abnormalities.   EKG was evaluated by me which showed sinus tachycardia without acute changes.  No peripheral edema, orthopnea, and with significatn wheezing doubt CHF.  Low suspicion for PE at this time given clinically consistent with COPD, trigger of allergies, nasal congestion present.  Denies significant CP, troponin negative, doubt ACS.     Labs completed and personally evaluated by me   Received solumedrol with EMS and duonebs-continued dyspnea,and wheezing. Placed on continuous albuterol, ordered Mg, plan to reevaluate and consider likely admission for COPD exacerbation.          Final Clinical Impression(s) / ED Diagnoses Final diagnoses:  COPD with acute exacerbation  Acute respiratory failure with hypoxia    Rx / DC Orders ED Discharge Orders          Ordered    albuterol (VENTOLIN HFA) 108 (90 Base) MCG/ACT inhaler  Every 6 hours PRN        01/31/23 1849              Alvira MondaySchlossman, Keandria Berrocal, MD 01/31/23 2309

## 2023-01-31 NOTE — ED Triage Notes (Signed)
Pt BIB [facility] from home d/t SOB for the past 3 days. Does have Hx of asthma & takes albuterol at home with no improvement. EMS gave 2 Duo nebs, 1 albuterol neb, 0.3 Epi, 125 Solumedrol & when attempting to give 2g Mag her IV blew. No PIV upon arrival to ED, A/Ox4, c/o 9/10 CP & SOB continues. Expiratory wheezing persists , cap was 44, 90% on RA, 200/100, 100 bpm NSR, 150 CGB.

## 2023-01-31 NOTE — ED Notes (Signed)
Patient transported to X-ray 

## 2023-02-01 DIAGNOSIS — E119 Type 2 diabetes mellitus without complications: Secondary | ICD-10-CM | POA: Diagnosis present

## 2023-02-01 DIAGNOSIS — Z1152 Encounter for screening for COVID-19: Secondary | ICD-10-CM | POA: Diagnosis not present

## 2023-02-01 DIAGNOSIS — Z8249 Family history of ischemic heart disease and other diseases of the circulatory system: Secondary | ICD-10-CM | POA: Diagnosis not present

## 2023-02-01 DIAGNOSIS — K219 Gastro-esophageal reflux disease without esophagitis: Secondary | ICD-10-CM | POA: Diagnosis present

## 2023-02-01 DIAGNOSIS — G4730 Sleep apnea, unspecified: Secondary | ICD-10-CM | POA: Diagnosis present

## 2023-02-01 DIAGNOSIS — E785 Hyperlipidemia, unspecified: Secondary | ICD-10-CM | POA: Diagnosis present

## 2023-02-01 DIAGNOSIS — F17200 Nicotine dependence, unspecified, uncomplicated: Secondary | ICD-10-CM | POA: Diagnosis present

## 2023-02-01 DIAGNOSIS — Z79899 Other long term (current) drug therapy: Secondary | ICD-10-CM | POA: Diagnosis not present

## 2023-02-01 DIAGNOSIS — J9601 Acute respiratory failure with hypoxia: Secondary | ICD-10-CM

## 2023-02-01 DIAGNOSIS — J441 Chronic obstructive pulmonary disease with (acute) exacerbation: Secondary | ICD-10-CM

## 2023-02-01 DIAGNOSIS — F32A Depression, unspecified: Secondary | ICD-10-CM | POA: Diagnosis present

## 2023-02-01 DIAGNOSIS — F419 Anxiety disorder, unspecified: Secondary | ICD-10-CM | POA: Diagnosis present

## 2023-02-01 DIAGNOSIS — B3731 Acute candidiasis of vulva and vagina: Secondary | ICD-10-CM | POA: Diagnosis present

## 2023-02-01 DIAGNOSIS — Z7982 Long term (current) use of aspirin: Secondary | ICD-10-CM | POA: Diagnosis not present

## 2023-02-01 DIAGNOSIS — Z96652 Presence of left artificial knee joint: Secondary | ICD-10-CM | POA: Diagnosis present

## 2023-02-01 DIAGNOSIS — R0602 Shortness of breath: Secondary | ICD-10-CM | POA: Diagnosis not present

## 2023-02-01 DIAGNOSIS — Z7951 Long term (current) use of inhaled steroids: Secondary | ICD-10-CM | POA: Diagnosis not present

## 2023-02-01 DIAGNOSIS — Z830 Family history of human immunodeficiency virus [HIV] disease: Secondary | ICD-10-CM | POA: Diagnosis not present

## 2023-02-01 DIAGNOSIS — I110 Hypertensive heart disease with heart failure: Secondary | ICD-10-CM | POA: Diagnosis present

## 2023-02-01 DIAGNOSIS — Z5900 Homelessness unspecified: Secondary | ICD-10-CM | POA: Diagnosis not present

## 2023-02-01 DIAGNOSIS — I5032 Chronic diastolic (congestive) heart failure: Secondary | ICD-10-CM | POA: Diagnosis present

## 2023-02-01 DIAGNOSIS — J45901 Unspecified asthma with (acute) exacerbation: Secondary | ICD-10-CM | POA: Diagnosis present

## 2023-02-01 DIAGNOSIS — Z825 Family history of asthma and other chronic lower respiratory diseases: Secondary | ICD-10-CM | POA: Diagnosis not present

## 2023-02-01 DIAGNOSIS — R079 Chest pain, unspecified: Secondary | ICD-10-CM | POA: Diagnosis not present

## 2023-02-01 MED ORDER — METHOCARBAMOL 500 MG PO TABS
500.0000 mg | ORAL_TABLET | Freq: Four times a day (QID) | ORAL | Status: DC | PRN
  Administered 2023-02-01: 500 mg via ORAL
  Filled 2023-02-01: qty 1

## 2023-02-01 MED ORDER — IPRATROPIUM-ALBUTEROL 0.5-2.5 (3) MG/3ML IN SOLN
3.0000 mL | Freq: Four times a day (QID) | RESPIRATORY_TRACT | Status: DC
  Administered 2023-02-01: 3 mL via RESPIRATORY_TRACT
  Filled 2023-02-01: qty 3

## 2023-02-01 MED ORDER — GUAIFENESIN-DM 100-10 MG/5ML PO SYRP
10.0000 mL | ORAL_SOLUTION | Freq: Four times a day (QID) | ORAL | Status: DC | PRN
  Administered 2023-02-02: 10 mL via ORAL
  Filled 2023-02-01: qty 10

## 2023-02-01 MED ORDER — IPRATROPIUM-ALBUTEROL 0.5-2.5 (3) MG/3ML IN SOLN
3.0000 mL | RESPIRATORY_TRACT | Status: DC
  Administered 2023-02-01 – 2023-02-03 (×12): 3 mL via RESPIRATORY_TRACT
  Filled 2023-02-01 (×12): qty 3

## 2023-02-01 MED ORDER — FLUCONAZOLE 150 MG PO TABS
150.0000 mg | ORAL_TABLET | Freq: Once | ORAL | Status: AC
  Administered 2023-02-01: 150 mg via ORAL
  Filled 2023-02-01: qty 1

## 2023-02-01 NOTE — Plan of Care (Signed)
  Problem: Education: Goal: Knowledge of disease or condition will improve Outcome: Progressing   Problem: Activity: Goal: Ability to tolerate increased activity will improve Outcome: Progressing Goal: Will verbalize the importance of balancing activity with adequate rest periods Outcome: Progressing   Problem: Respiratory: Goal: Ability to maintain a clear airway will improve Outcome: Progressing   Problem: Clinical Measurements: Goal: Ability to maintain clinical measurements within normal limits will improve Outcome: Progressing

## 2023-02-01 NOTE — Progress Notes (Signed)
PROGRESS NOTE    Kathleen Rodriguez  FVC:[id number] DOB: 1966/08/21 DOA: 01/31/2023 PCP: Hoy Register, MD    Brief Narrative:  57 year old with history of COPD, hypertension, sleep apnea on CPAP presents with worsening cough and wheezing for about 3 days.  In the emergency room tachypneic, tachycardic, 86% on room air initially requiring 10 L oxygen.  Admitted with COPD exacerbation respiratory distress.   Assessment & Plan:   Acute COPD exacerbation Acute hypoxemic respiratory failure Acute respiratory distress secondary to above. Patient presented with hypoxemia, 86% on room air initially requiring 10 L of oxygen and does not use oxygen at home.  She was tachypneic and tachycardic and unable to talk in complete sentences.  Agree with admission to monitored unit because of severity of symptoms. IV steroids to prednisone taper. inhalational steroids, scheduled and as needed bronchodilators, deep breathing exercises, incentive spirometry, chest physiotherapy. Mucolytic. Continue Singulair. Antibiotics due to severity of symptoms.  Will continue azithromycin for 5 days. Supplemental oxygen to keep saturations more than 92%.  Hypertension: on clonidine . Stable.   Anxiety depression: Patient on Lexapro.  Sleep apnea: On CPAP at night.     DVT prophylaxis: enoxaparin (LOVENOX) injection 40 mg Start: 01/31/23 1900   Code Status: Full code Family Communication: None at the bedside Disposition Plan: Status is: Inpatient Remains inpatient appropriate because: Significant wheezing     Consultants:  None  Procedures:  None  Antimicrobials:  Azithromycin 4/9---   Subjective: Patient seen and examined.  She is off oxygen but very short of breath on minimal mobility.  Feels chest is tight.  Also complains of vaginal itching and wants to use something for fungus.  Without fever.  Did wear CPAP overnight.  Objective: Vitals:   02/01/23 0403 02/01/23 0758 02/01/23 0803  02/01/23 0920  BP: (!) 146/89  (!) 156/76 (!) 159/78  Pulse: 93  87 93  Resp: 18 19 18 16   Temp: 97.8 F (36.6 C)     TempSrc: Oral     SpO2: 95%  100% 98%  Weight: 93.1 kg     Height:        Intake/Output Summary (Last 24 hours) at 02/01/2023 1157 Last data filed at 02/01/2023 0920 Gross per 24 hour  Intake 240 ml  Output 600 ml  Net -360 ml   Filed Weights   01/31/23 1258 01/31/23 2122 02/01/23 0403  Weight: 87.5 kg 93.1 kg 93.1 kg    Examination:  General exam: Appears mildly anxious.  On room air at rest.  Tremulous. Respiratory system: Poor inspiratory effort.  Poor bilateral air entry.  Diffuse expiratory wheezes. Cardiovascular system: S1 & S2 heard, RRR.  Gastrointestinal system: Soft.  Nontender.  Bowel sound present.   Central nervous system: Alert and oriented. No focal neurological deficits. Extremities: Symmetric 5 x 5 power.    Data Reviewed: I have personally reviewed following labs and imaging studies  CBC: Recent Labs  Lab 01/31/23 1329  WBC 8.9  HGB 12.8  HCT 40.0  MCV 86.0  PLT 319   Basic Metabolic Panel: Recent Labs  Lab 01/31/23 1329  NA 139  K 3.6  CL 105  CO2 25  GLUCOSE 121*  BUN 9  CREATININE 0.83  CALCIUM 8.2*   GFR: Estimated Creatinine Clearance: 84.3 mL/min (by C-G formula based on SCr of 0.83 mg/dL). Liver Function Tests: No results for input(s): "AST", "ALT", "ALKPHOS", "BILITOT", "PROT", "ALBUMIN" in the last 168 hours. No results for input(s): "LIPASE", "AMYLASE" in the last  168 hours. No results for input(s): "AMMONIA" in the last 168 hours. Coagulation Profile: No results for input(s): "INR", "PROTIME" in the last 168 hours. Cardiac Enzymes: No results for input(s): "CKTOTAL", "CKMB", "CKMBINDEX", "TROPONINI" in the last 168 hours. BNP (last 3 results) No results for input(s): "PROBNP" in the last 8760 hours. HbA1C: No results for input(s): "HGBA1C" in the last 72 hours. CBG: Recent Labs  Lab 01/31/23 1955   GLUCAP 273*   Lipid Profile: No results for input(s): "CHOL", "HDL", "LDLCALC", "TRIG", "CHOLHDL", "LDLDIRECT" in the last 72 hours. Thyroid Function Tests: No results for input(s): "TSH", "T4TOTAL", "FREET4", "T3FREE", "THYROIDAB" in the last 72 hours. Anemia Panel: No results for input(s): "VITAMINB12", "FOLATE", "FERRITIN", "TIBC", "IRON", "RETICCTPCT" in the last 72 hours. Sepsis Labs: No results for input(s): "PROCALCITON", "LATICACIDVEN" in the last 168 hours.  Recent Results (from the past 240 hour(s))  Resp panel by RT-PCR (RSV, Flu A&B, Covid) Anterior Nasal Swab     Status: None   Collection Time: 01/31/23  4:00 PM   Specimen: Anterior Nasal Swab  Result Value Ref Range Status   SARS Coronavirus 2 by RT PCR NEGATIVE NEGATIVE Final   Influenza A by PCR NEGATIVE NEGATIVE Final   Influenza B by PCR NEGATIVE NEGATIVE Final    Comment: (NOTE) The Xpert Xpress SARS-CoV-2/FLU/RSV plus assay is intended as an aid in the diagnosis of influenza from Nasopharyngeal swab specimens and should not be used as a sole basis for treatment. Nasal washings and aspirates are unacceptable for Xpert Xpress SARS-CoV-2/FLU/RSV testing.  Fact Sheet for Patients: BloggerCourse.comhttps://www.fda.gov/media/152166/download  Fact Sheet for Healthcare Providers: SeriousBroker.ithttps://www.fda.gov/media/152162/download  This test is not yet approved or cleared by the [place] FDA and has been authorized for detection and/or diagnosis of SARS-CoV-2 by FDA under an Emergency Use Authorization (EUA). This EUA will remain in effect (meaning this test can be used) for the duration of the COVID-19 declaration under Section 564(b)(1) of the Act, 21 U.S.C. section 360bbb-3(b)(1), unless the authorization is terminated or revoked.     Resp Syncytial Virus by PCR NEGATIVE NEGATIVE Final    Comment: (NOTE) Fact Sheet for Patients: BloggerCourse.comhttps://www.fda.gov/media/152166/download  Fact Sheet for Healthcare  Providers: SeriousBroker.ithttps://www.fda.gov/media/152162/download  This test is not yet approved or cleared by the [place] FDA and has been authorized for detection and/or diagnosis of SARS-CoV-2 by FDA under an Emergency Use Authorization (EUA). This EUA will remain in effect (meaning this test can be used) for the duration of the COVID-19 declaration under Section 564(b)(1) of the Act, 21 U.S.C. section 360bbb-3(b)(1), unless the authorization is terminated or revoked.  Performed at [facility], [address] [place], [place] [address]          Radiology Studies: DG Chest 2 View  Result Date: 01/31/2023 CLINICAL DATA:  Shortness of breath.  Central chest pain. EXAM: CHEST - 2 VIEW COMPARISON:  Chest radiographs 12/30/2022 and 08/27/2022 FINDINGS: Cardiac silhouette and mediastinal contours are within normal limits. The lungs are clear. No pleural effusion or pneumothorax. Mild-to-moderate multilevel degenerative disc changes of the thoracic spine. Unchanged posttraumatic spurring at the coracoclavicular ligament insertions on the lateral clavicle with unchanged moderate clavicular head peripheral spurring and mild asymmetric widening of the right clavicular joint. IMPRESSION: 1. No active cardiopulmonary disease. 2. Unchanged posttraumatic changes of the right clavicle. Electronically Signed   By: Neita Garnetonald  Viola M.D.   On: 01/31/2023 13:59        Scheduled Meds:  aspirin EC  81 mg Oral Daily  azithromycin  500 mg Oral Daily   budesonide (PULMICORT) nebulizer solution  2 mg Nebulization Q12H   cloNIDine  0.2 mg Oral BID   enoxaparin (LOVENOX) injection  40 mg Subcutaneous Q24H   escitalopram  10 mg Oral Daily   ipratropium-albuterol  3 mL Nebulization Q4H   methylPREDNISolone (SOLU-MEDROL) injection  40 mg Intravenous Q12H   Followed by   Melene Muller ON 02/03/2023] predniSONE  40 mg Oral Q breakfast   montelukast  10 mg Oral QHS   pantoprazole  40 mg Oral Daily   Continuous  Infusions:   LOS: 0 days    Time spent: 35 minutes.    Dorcas Carrow, MD [facility] Pager [phone]

## 2023-02-01 NOTE — Plan of Care (Signed)
  Problem: Education: Goal: Knowledge of disease or condition will improve Outcome: Progressing   Problem: Activity: Goal: Ability to tolerate increased activity will improve Outcome: Progressing   Problem: Respiratory: Goal: Ability to maintain a clear airway will improve Outcome: Progressing   

## 2023-02-01 NOTE — Progress Notes (Signed)
Pt states she can and will place CPAP on when ready to go to sleep. Instructed pt to call if she needs any type of assistance.,

## 2023-02-02 DIAGNOSIS — J9601 Acute respiratory failure with hypoxia: Secondary | ICD-10-CM | POA: Diagnosis not present

## 2023-02-02 DIAGNOSIS — J441 Chronic obstructive pulmonary disease with (acute) exacerbation: Secondary | ICD-10-CM | POA: Diagnosis not present

## 2023-02-02 MED ORDER — PHENOL 1.4 % MT LIQD
1.0000 | OROMUCOSAL | Status: DC | PRN
  Filled 2023-02-02: qty 177

## 2023-02-02 MED ORDER — SALINE SPRAY 0.65 % NA SOLN
1.0000 | NASAL | Status: DC | PRN
  Filled 2023-02-02: qty 44

## 2023-02-02 NOTE — Progress Notes (Signed)
PROGRESS NOTE    [name]  ZOX:[id number] DOB: 28-Jan-1966 DOA: 01/31/2023 PCP: Hoy RegisterNewlin, [name], MD    Brief Narrative:  57 year old with history of COPD, hypertension, sleep apnea on CPAP presents with worsening cough and wheezing for about 3 days.  In the emergency room tachypneic, tachycardic, 86% on room air initially requiring 10 L oxygen.  Admitted with COPD exacerbation respiratory distress.   Assessment & Plan:   Acute COPD exacerbation Acute hypoxemic respiratory failure Acute respiratory distress secondary to above. Patient presented with hypoxemia, 86% on room air initially requiring 10 L of oxygen and does not use oxygen at home.  She was tachypneic and tachycardic and unable to talk in complete sentences.  Some clinical improvement but persistent wheezing today. Continue IV steroids today.  Continue inhalational steroids, scheduled and as needed bronchodilators, deep breathing exercises, incentive spirometry, chest physiotherapy. Mucolytic. Continue Singulair. Antibiotics due to severity of symptoms.  Will continue azithromycin for 5 days. Supplemental oxygen to keep saturations more than 92%. Mobilize in the hallway.  Hypertension: on clonidine . Stable.   Anxiety depression: Patient on Lexapro.  Sleep apnea: On CPAP at night.  Homelessness: Patient is homeless.  She has to use nebulizers.  She is to use CPAP at night.  Case management to help provide resources.     DVT prophylaxis: enoxaparin (LOVENOX) injection 40 mg Start: 01/31/23 1900   Code Status: Full code Family Communication: None at the bedside Disposition Plan: Status is: Inpatient Remains inpatient appropriate because: Significant wheezing     Consultants:  None  Procedures:  None  Antimicrobials:  Azithromycin 4/9---   Subjective:  Seen and examined.  She has some fullness of the left ear.  Still wheezing and coughing.  Somehow better than yesterday.  Objective: Vitals:    02/02/23 0455 02/02/23 0722 02/02/23 0723 02/02/23 1144  BP: 128/66     Pulse: 81     Resp: 20     Temp: 97.7 F (36.5 C)     TempSrc: Oral     SpO2: 96% 96% 96% 98%  Weight:      Height:        Intake/Output Summary (Last 24 hours) at 02/02/2023 1239 Last data filed at 02/02/2023 0345 Gross per 24 hour  Intake 800 ml  Output 980 ml  Net -180 ml    Filed Weights   01/31/23 1258 01/31/23 2122 02/01/23 0403  Weight: 87.5 kg 93.1 kg 93.1 kg    Examination:  General exam: Appears comfortable at rest.  Has both expiratory and inspiratory wheezes. Respiratory system: Poor inspiratory effort.  Both expiratory and inspiratory wheezes. Cardiovascular system: S1 & S2 heard, RRR.  Gastrointestinal system: Soft.  Nontender.  Bowel sound present.   Central nervous system: Alert and oriented. No focal neurological deficits. Extremities: Symmetric 5 x 5 power.    Data Reviewed: I have personally reviewed following labs and imaging studies  CBC: Recent Labs  Lab 01/31/23 1329  WBC 8.9  HGB 12.8  HCT 40.0  MCV 86.0  PLT 319    Basic Metabolic Panel: Recent Labs  Lab 01/31/23 1329  NA 139  K 3.6  CL 105  CO2 25  GLUCOSE 121*  BUN 9  CREATININE 0.83  CALCIUM 8.2*    GFR: Estimated Creatinine Clearance: 84.3 mL/min (by C-G formula based on SCr of 0.83 mg/dL). Liver Function Tests: No results for input(s): "AST", "ALT", "ALKPHOS", "BILITOT", "PROT", "ALBUMIN" in the last 168 hours. No results for input(s): "LIPASE", "AMYLASE"  in the last 168 hours. No results for input(s): "AMMONIA" in the last 168 hours. Coagulation Profile: No results for input(s): "INR", "PROTIME" in the last 168 hours. Cardiac Enzymes: No results for input(s): "CKTOTAL", "CKMB", "CKMBINDEX", "TROPONINI" in the last 168 hours. BNP (last 3 results) No results for input(s): "PROBNP" in the last 8760 hours. HbA1C: No results for input(s): "HGBA1C" in the last 72 hours. CBG: Recent Labs  Lab  01/31/23 1955  GLUCAP 273*    Lipid Profile: No results for input(s): "CHOL", "HDL", "LDLCALC", "TRIG", "CHOLHDL", "LDLDIRECT" in the last 72 hours. Thyroid Function Tests: No results for input(s): "TSH", "T4TOTAL", "FREET4", "T3FREE", "THYROIDAB" in the last 72 hours. Anemia Panel: No results for input(s): "VITAMINB12", "FOLATE", "FERRITIN", "TIBC", "IRON", "RETICCTPCT" in the last 72 hours. Sepsis Labs: No results for input(s): "PROCALCITON", "LATICACIDVEN" in the last 168 hours.  Recent Results (from the past 240 hour(s))  Resp panel by RT-PCR (RSV, Flu A&B, Covid) Anterior Nasal Swab     Status: None   Collection Time: 01/31/23  4:00 PM   Specimen: Anterior Nasal Swab  Result Value Ref Range Status   SARS Coronavirus 2 by RT PCR NEGATIVE NEGATIVE Final   Influenza A by PCR NEGATIVE NEGATIVE Final   Influenza B by PCR NEGATIVE NEGATIVE Final    Comment: (NOTE) The Xpert Xpress SARS-CoV-2/FLU/RSV plus assay is intended as an aid in the diagnosis of influenza from Nasopharyngeal swab specimens and should not be used as a sole basis for treatment. Nasal washings and aspirates are unacceptable for Xpert Xpress SARS-CoV-2/FLU/RSV testing.  Fact Sheet for Patients: BloggerCourse.com  Fact Sheet for Healthcare Providers: SeriousBroker.it  This test is not yet approved or cleared by the [place] FDA and has been authorized for detection and/or diagnosis of SARS-CoV-2 by FDA under an Emergency Use Authorization (EUA). This EUA will remain in effect (meaning this test can be used) for the duration of the COVID-19 declaration under Section 564(b)(1) of the Act, 21 U.S.C. section 360bbb-3(b)(1), unless the authorization is terminated or revoked.     Resp Syncytial Virus by PCR NEGATIVE NEGATIVE Final    Comment: (NOTE) Fact Sheet for Patients: BloggerCourse.com  Fact Sheet for Healthcare  Providers: SeriousBroker.it  This test is not yet approved or cleared by the [place] FDA and has been authorized for detection and/or diagnosis of SARS-CoV-2 by FDA under an Emergency Use Authorization (EUA). This EUA will remain in effect (meaning this test can be used) for the duration of the COVID-19 declaration under Section 564(b)(1) of the Act, 21 U.S.C. section 360bbb-3(b)(1), unless the authorization is terminated or revoked.  Performed at [facility], [address], [place], [place] [address]          Radiology Studies: DG Chest 2 View  Result Date: 01/31/2023 CLINICAL DATA:  Shortness of breath.  Central chest pain. EXAM: CHEST - 2 VIEW COMPARISON:  Chest radiographs 12/30/2022 and 08/27/2022 FINDINGS: Cardiac silhouette and mediastinal contours are within normal limits. The lungs are clear. No pleural effusion or pneumothorax. Mild-to-moderate multilevel degenerative disc changes of the thoracic spine. Unchanged posttraumatic spurring at the coracoclavicular ligament insertions on the lateral clavicle with unchanged moderate clavicular head peripheral spurring and mild asymmetric widening of the right clavicular joint. IMPRESSION: 1. No active cardiopulmonary disease. 2. Unchanged posttraumatic changes of the right clavicle. Electronically Signed   By: Neita Garnet M.D.   On: 01/31/2023 13:59        Scheduled Meds:  aspirin EC  81  mg Oral Daily   azithromycin  500 mg Oral Daily   budesonide (PULMICORT) nebulizer solution  2 mg Nebulization Q12H   cloNIDine  0.2 mg Oral BID   enoxaparin (LOVENOX) injection  40 mg Subcutaneous Q24H   escitalopram  10 mg Oral Daily   ipratropium-albuterol  3 mL Nebulization Q4H   montelukast  10 mg Oral QHS   pantoprazole  40 mg Oral Daily   [START ON 02/03/2023] predniSONE  40 mg Oral Q breakfast   Continuous Infusions:   LOS: 1 day    Time spent: 35 minutes.    Dorcas Carrow, MD [facility] Pager [phone]

## 2023-02-02 NOTE — TOC Progression Note (Addendum)
Transition of Care [facility]) - Progression Note    Patient Details  Name: Kathleen Rodriguez MRN: [id number] Date of Birth: 12-27-1965  [facility]) CM/SW Contact  Leone Haven, RN Phone Number: 02/02/2023, 1:47 PM  Clinical Narrative:    Patient is homeless, she states she is on cpap machine and neb machine and needs to be somewhere , where she can use these, she states she has been working with pathway and partnership for transitional housing. Patient is not sure where she will go at discharge.  TOC following.        Expected Discharge Plan and Services                                               Social Determinants of Health (SDOH) Interventions SDOH Screenings   Food Insecurity: No Food Insecurity (12/31/2022)  Housing: Low Risk  (12/31/2022)  Transportation Needs: No Transportation Needs (12/31/2022)  Utilities: Not At Risk (12/31/2022)  Alcohol Screen: Low Risk  (07/28/2022)  Depression (PHQ2-9): Low Risk  (12/10/2021)  Financial Resource Strain: High Risk (08/31/2022)  Physical Activity: Inactive (03/14/2022)  Social Connections: Moderately Isolated (03/14/2022)  Stress: Stress Concern Present (08/31/2022)  Tobacco Use: Medium Risk (01/31/2023)    Readmission Risk Interventions     No data to display

## 2023-02-02 NOTE — Plan of Care (Signed)
  Problem: Activity: Goal: Ability to tolerate increased activity will improve Outcome: Progressing   Problem: Respiratory: Goal: Ability to maintain a clear airway will improve Outcome: Progressing Goal: Levels of oxygenation will improve Outcome: Progressing Goal: Ability to maintain adequate ventilation will improve Outcome: Progressing   

## 2023-02-03 ENCOUNTER — Other Ambulatory Visit: Payer: Self-pay | Admitting: Family Medicine

## 2023-02-03 ENCOUNTER — Other Ambulatory Visit (HOSPITAL_COMMUNITY): Payer: Self-pay

## 2023-02-03 DIAGNOSIS — R7303 Prediabetes: Secondary | ICD-10-CM

## 2023-02-03 DIAGNOSIS — J441 Chronic obstructive pulmonary disease with (acute) exacerbation: Secondary | ICD-10-CM | POA: Diagnosis not present

## 2023-02-03 MED ORDER — METHOCARBAMOL 500 MG PO TABS
500.0000 mg | ORAL_TABLET | Freq: Four times a day (QID) | ORAL | 0 refills | Status: AC | PRN
  Filled 2023-02-03: qty 20, 5d supply, fill #0

## 2023-02-03 MED ORDER — PREDNISONE 10 MG PO TABS: ORAL_TABLET | ORAL | 0 refills | Status: DC

## 2023-02-03 MED ORDER — METHOCARBAMOL 500 MG PO TABS: 500.0000 mg | ORAL_TABLET | Freq: Four times a day (QID) | ORAL | 0 refills | Status: DC | PRN

## 2023-02-03 MED ORDER — HYDROXYZINE HCL 10 MG PO TABS
10.0000 mg | ORAL_TABLET | Freq: Three times a day (TID) | ORAL | 0 refills | Status: AC | PRN
  Filled 2023-02-03: qty 30, 10d supply, fill #0

## 2023-02-03 MED ORDER — FLUCONAZOLE 150 MG PO TABS: 150.0000 mg | ORAL_TABLET | ORAL | 0 refills | Status: DC

## 2023-02-03 MED ORDER — ALBUTEROL SULFATE HFA 108 (90 BASE) MCG/ACT IN AERS
2.0000 | INHALATION_SPRAY | Freq: Four times a day (QID) | RESPIRATORY_TRACT | 3 refills | Status: DC | PRN
Start: 2023-02-03 — End: 2023-03-07
  Filled 2023-02-03: qty 18, 17d supply, fill #0

## 2023-02-03 MED ORDER — PREDNISONE 10 MG PO TABS
ORAL_TABLET | ORAL | 0 refills | Status: DC
  Filled 2023-02-03: qty 30, 12d supply, fill #0

## 2023-02-03 MED ORDER — ALBUTEROL SULFATE HFA 108 (90 BASE) MCG/ACT IN AERS
2.0000 | INHALATION_SPRAY | Freq: Four times a day (QID) | RESPIRATORY_TRACT | 3 refills | Status: DC | PRN
Start: 2023-02-03 — End: 2023-02-03

## 2023-02-03 MED ORDER — LORATADINE 10 MG PO TABS: 10.0000 mg | ORAL_TABLET | Freq: Every day | ORAL | 2 refills | Status: DC

## 2023-02-03 MED ORDER — LORATADINE 10 MG PO TABS
10.0000 mg | ORAL_TABLET | Freq: Every day | ORAL | 2 refills | Status: DC
  Filled 2023-02-03: qty 30, 30d supply, fill #0

## 2023-02-03 MED ORDER — FLUCONAZOLE 150 MG PO TABS
150.0000 mg | ORAL_TABLET | ORAL | 0 refills | Status: AC
  Filled 2023-02-03 (×2): qty 2, 14d supply, fill #0

## 2023-02-03 MED ORDER — FLUTICASONE PROPIONATE 50 MCG/ACT NA SUSP
1.0000 | Freq: Every day | NASAL | 3 refills | Status: AC | PRN
Start: 2023-02-03 — End: 2023-05-04
  Filled 2023-02-03: qty 16, 30d supply, fill #0

## 2023-02-03 MED ORDER — HYDROXYZINE HCL 10 MG PO TABS: 10.0000 mg | ORAL_TABLET | Freq: Three times a day (TID) | ORAL | 0 refills | Status: DC | PRN

## 2023-02-03 MED ORDER — FLUTICASONE PROPIONATE 50 MCG/ACT NA SUSP
1.0000 | Freq: Every day | NASAL | 3 refills | Status: DC | PRN
Start: 2023-02-03 — End: 2023-02-03

## 2023-02-03 NOTE — Telephone Encounter (Signed)
Medication Refill - Medication: metFORMIN  Nurse from [facility] called, requesting medication be filled. Med is not on the active med list.  Please advise.  (Agent: If yes, when and what did the pharmacy advise?)  Preferred Pharmacy (with phone number or street name):  [id number] - [place], [place] - [address] AT [facility] [address]  [address] [place] [address]  Phone: [phone] Fax: [phone]  Hours: Open 24 hours   Has the patient been seen for an appointment in the last year OR does the patient have an upcoming appointment? Yes.    Agent: Please be advised that RX refills may take up to 3 business days. We ask that you follow-up with your pharmacy.

## 2023-02-03 NOTE — Plan of Care (Signed)

## 2023-02-03 NOTE — Telephone Encounter (Signed)
Requested medication (s) are due for refill today - no  Requested medication (s) are on the active medication list -no  Future visit scheduled -yes  Last refill: 02/09/22  Notes to clinic: Hospital is requesting RF- not current on medication list- sent flr provider review   Requested Prescriptions  Pending Prescriptions Disp Refills   metFORMIN (GLUCOPHAGE) 500 MG tablet 60 tablet 0    Sig: Take 1 tablet (500 mg total) by mouth 2 (two) times daily with a meal.     Endocrinology:  Diabetes - Biguanides Failed - 02/03/2023  3:25 PM      Failed - B12 Level in normal range and within 720 days    Vitamin B-12  Date Value Ref Range Status  09/05/2018 529 232 - 1,245 pg/mL Final         Failed - Valid encounter within last 6 months    Recent Outpatient Visits           1 year ago Essential hypertension   [facility] Storm Frisk, MD   1 year ago Mixed stress and urge urinary incontinence   [facility] [place], Odette Horns, MD   2 years ago Annual physical exam   [facility] [place], Odette Horns, MD   3 years ago Acute midline low back pain without sciatica   [place] [place], Odette Horns, MD   3 years ago Vaginal candidiasis   [facility] [place], Odette Horns, MD       Future Appointments             In 1 month Hoy Register, MD  [facility]            Passed - Cr in normal range and within 360 days    Creatinine, Ser  Date Value Ref Range Status  01/31/2023 0.83 0.44 - 1.00 mg/dL Final         Passed - HBA1C is between 0 and 7.9 and within 180 days    Hgb A1c MFr Bld  Date Value Ref Range Status  12/31/2022 5.8 (H) 4.8 - 5.6 % Final    Comment:    (NOTE)         Prediabetes: 5.7 - 6.4         Diabetes: >6.4         Glycemic control for adults with diabetes: <7.0           Passed - eGFR in normal range and within 360 days    GFR calc Af Amer  Date Value Ref Range Status  12/02/2020 84 >59 mL/min/1.73 Final    Comment:    **In accordance with recommendations from the NKF-ASN Task force,**   [facility] is in the process of updating its eGFR calculation to the   2021 CKD-EPI creatinine equation that estimates kidney function   without a race variable.    GFR, Estimated  Date Value Ref Range Status  01/31/2023 >60 >60 mL/min Final    Comment:    (NOTE) Calculated using the CKD-EPI Creatinine Equation (2021)          Passed - CBC within normal limits and completed in the last 12 months    WBC  Date Value Ref Range Status  01/31/2023 8.9 4.0 - 10.5 K/uL Final   RBC  Date Value Ref Range Status  01/31/2023 4.65  3.87 - 5.11 MIL/uL Final   Hemoglobin  Date Value Ref Range Status  01/31/2023 12.8 12.0 - 15.0 g/dL Final  16/07/9603 54.0 11.1 - 15.9 g/dL Final   HCT  Date Value Ref Range Status  01/31/2023 40.0 36.0 - 46.0 % Final   Hematocrit  Date Value Ref Range Status  12/02/2020 46.9 (H) 34.0 - 46.6 % Final   MCHC  Date Value Ref Range Status  01/31/2023 32.0 30.0 - 36.0 g/dL Final   [facility]  Date Value Ref Range Status  01/31/2023 27.5 26.0 - 34.0 pg Final   MCV  Date Value Ref Range Status  01/31/2023 86.0 80.0 - 100.0 fL Final  12/02/2020 87 79 - 97 fL Final   No results found for: "PLTCOUNTKUC", "LABPLAT", "POCPLA" RDW  Date Value Ref Range Status  01/31/2023 18.6 (H) 11.5 - 15.5 % Final  12/02/2020 13.6 11.7 - 15.4 % Final            Requested Prescriptions  Pending Prescriptions Disp Refills   metFORMIN (GLUCOPHAGE) 500 MG tablet 60 tablet 0    Sig: Take 1 tablet (500 mg total) by mouth 2 (two) times daily with a meal.     Endocrinology:  Diabetes - Biguanides Failed - 02/03/2023  3:25 PM      Failed - B12 Level in normal range and within 720 days    Vitamin B-12  Date Value Ref Range Status  09/05/2018 529  232 - 1,245 pg/mL Final         Failed - Valid encounter within last 6 months    Recent Outpatient Visits           1 year ago Essential hypertension   [place] [facility] Storm Frisk, MD   1 year ago Mixed stress and urge urinary incontinence   [place] [facility] [place], Odette Horns, MD   2 years ago Annual physical exam   [place] [facility] [place], Odette Horns, MD   3 years ago Acute midline low back pain without sciatica   [place] [facility] [place], Odette Horns, MD   3 years ago Vaginal candidiasis   [facility] [place], Odette Horns, MD       Future Appointments             In 1 month Hoy Register, MD [facility]            Passed - Cr in normal range and within 360 days    Creatinine, Ser  Date Value Ref Range Status  01/31/2023 0.83 0.44 - 1.00 mg/dL Final         Passed - HBA1C is between 0 and 7.9 and within 180 days    Hgb A1c MFr Bld  Date Value Ref Range Status  12/31/2022 5.8 (H) 4.8 - 5.6 % Final    Comment:    (NOTE)         Prediabetes: 5.7 - 6.4         Diabetes: >6.4         Glycemic control for adults with diabetes: <7.0          Passed - eGFR in normal range and within 360 days    GFR calc Af Amer  Date Value Ref Range Status  12/02/2020 84 >59 mL/min/1.73 Final    Comment:    **In accordance with recommendations  from the NKF-ASN Task force,**   [facility] is in the process of updating its eGFR calculation to the   2021 CKD-EPI creatinine equation that estimates kidney function   without a race variable.    GFR, Estimated  Date Value Ref Range Status  01/31/2023 >60 >60 mL/min Final    Comment:    (NOTE) Calculated using the CKD-EPI Creatinine Equation (2021)          Passed - CBC within normal limits and completed in the last 12 months    WBC  Date Value Ref  Range Status  01/31/2023 8.9 4.0 - 10.5 K/uL Final   RBC  Date Value Ref Range Status  01/31/2023 4.65 3.87 - 5.11 MIL/uL Final   Hemoglobin  Date Value Ref Range Status  01/31/2023 12.8 12.0 - 15.0 g/dL Final  16/96/7893 81.0 11.1 - 15.9 g/dL Final   HCT  Date Value Ref Range Status  01/31/2023 40.0 36.0 - 46.0 % Final   Hematocrit  Date Value Ref Range Status  12/02/2020 46.9 (H) 34.0 - 46.6 % Final   MCHC  Date Value Ref Range Status  01/31/2023 32.0 30.0 - 36.0 g/dL Final   [facility]  Date Value Ref Range Status  01/31/2023 27.5 26.0 - 34.0 pg Final   MCV  Date Value Ref Range Status  01/31/2023 86.0 80.0 - 100.0 fL Final  12/02/2020 87 79 - 97 fL Final   No results found for: "PLTCOUNTKUC", "LABPLAT", "POCPLA" RDW  Date Value Ref Range Status  01/31/2023 18.6 (H) 11.5 - 15.5 % Final  12/02/2020 13.6 11.7 - 15.4 % Final

## 2023-02-03 NOTE — TOC Transition Note (Addendum)
Transition of Care [facility]) - CM/SW Discharge Note   Patient Details  Name: Kathleen Rodriguez MRN: [id number] Date of Birth: 1965/12/19  [facility]) CM/SW Contact:  Leone Haven, RN Phone Number: 02/03/2023, 12:07 PM   Clinical Narrative:    NCM contacted Partnership and left message for Selina Cooley to return call.  NCM also contacted [employer] , who patient states she called , they state they have exhausted their funds so they are not doing hotel vouchers.  Patient states she and her spouse are separated so she can not go stay with him.  She states she will call her girl friend to see if she can stay with her for some days.  NCM did give patient housing resource list on 4/11.  Patient states she will be going to her girlfriends apt at the sam address as hers but [address]  She states she does not have money to pay for medications today. NCM will have MD to send meds to [facility] pharmacy so patient can be billed for medications. She states she will need transportation also. NCM will ast with taxi voucher.  She has taxi voucher on file.         Patient Goals and CMS Choice      Discharge Placement                         Discharge Plan and Services Additional resources added to the After Visit Summary for                                       Social Determinants of Health (SDOH) Interventions SDOH Screenings   Food Insecurity: No Food Insecurity (12/31/2022)  Housing: Low Risk  (12/31/2022)  Transportation Needs: No Transportation Needs (12/31/2022)  Utilities: Not At Risk (12/31/2022)  Alcohol Screen: Low Risk  (07/28/2022)  Depression (PHQ2-9): Low Risk  (12/10/2021)  Financial Resource Strain: High Risk (08/31/2022)  Physical Activity: Inactive (03/14/2022)  Social Connections: Moderately Isolated (03/14/2022)  Stress: Stress Concern Present (08/31/2022)  Tobacco Use: Medium Risk (01/31/2023)     Readmission Risk Interventions     No data to  display

## 2023-02-03 NOTE — Discharge Summary (Signed)
Physician Discharge Summary  Kathleen Rodriguez NFA:[id number] DOB: Feb 17, 1966 DOA: 01/31/2023  PCP: Hoy Register, MD  Admit date: 01/31/2023 Discharge date: 02/03/2023  Admitted From: Home Disposition: Home  Recommendations for Outpatient Follow-up:  Follow up with PCP in 1-2 weeks   Discharge Condition: Stable CODE STATUS: Full code Diet recommendation: Low-salt diet  Discharge summary: 57 year old with history of COPD, hypertension, sleep apnea on CPAP presented with worsening cough and wheezing for about 3 days.  In the emergency room tachypneic, tachycardic, 86% on room air initially requiring 10 L oxygen.  Admitted with COPD exacerbation and respiratory distress.  Acute COPD exacerbation Acute hypoxemic respiratory failure Acute respiratory distress secondary to above. Patient presented with hypoxemia, 86% on room air initially requiring 10 L of oxygen and does not use oxygen at home.  She was tachypneic and tachycardic and unable to talk in complete sentences.  Patient treated with IV steroids, aggressive bronchodilator therapy and chest physiotherapy.  Almost complete clinical improvement today.  She was also treated with Rocephin, day 3 today.  With good clinical improvement will discharge patient with prolonged prednisone taper.  She is already optimized on COPD management with Breztri, montelukast, albuterol as needed.  She will also take Claritin for antiallergy. Patient is on CPAP at night and able to use it. She is currently on room air. No evidence of ongoing infection, will discontinue further antibiotics.  Can use over-the-counter cough medications.   Hypertension: on clonidine . Stable.    Anxiety depression: Patient on Lexapro.  Continue.  She wanted some antianxiety medications, hydroxyzine was prescribed.   Sleep apnea: On CPAP at night.   Homelessness: Patient is homeless.  She has to use nebulizers CPAP.  She is in the process to get housing.  Hopefully this will  work out for her because she needs a lot of medical care.   Patient on room air.  Symptoms improved.  Able to go home today.  Discharge Diagnoses:  Principal Problem:   COPD (chronic obstructive pulmonary disease) Active Problems:   Vaginal candida   Acute asthma exacerbation   Essential hypertension   Anxiety   Acute respiratory failure with hypoxia   COPD with acute exacerbation    Discharge Instructions  Discharge Instructions     Diet - low sodium heart healthy   Complete by: As directed    Discharge instructions   Complete by: As directed    Can take over the counter cough medicine   Increase activity slowly   Complete by: As directed       Allergies as of 02/03/2023       Reactions   Tomato Hives, Itching, Other (See Comments)   ALSO REACTS TO KETCHUP   Latex Itching, Rash   Wool Alcohol [lanolin] Itching        Medication List     STOP taking these medications    benzonatate 200 MG capsule Commonly known as: TESSALON       TAKE these medications    Accu-Chek Guide Me w/Device Kit Use to check blood sugar once daily.   Accu-Chek Guide test strip Generic drug: glucose blood Use as instructed daily before breakfast.   albuterol 108 (90 Base) MCG/ACT inhaler Commonly known as: ProAir HFA Inhale 2 puffs into the lungs every 6 (six) hours as needed for wheezing.   aspirin EC 81 MG tablet Take 1 tablet (81 mg total) by mouth daily.   Breztri Aerosphere 160-9-4.8 MCG/ACT Aero Generic drug: Budeson-Glycopyrrol-Formoterol Inhale 2 puffs into  the lungs in the morning and at bedtime.   cloNIDine 0.2 MG tablet Commonly known as: CATAPRES Take 1 tablet (0.2 mg total) by mouth 2 (two) times daily. For BP and hot flashes   escitalopram 10 MG tablet Commonly known as: LEXAPRO Take 1 tablet (10 mg total) by mouth daily.   fluconazole 150 MG tablet Commonly known as: Diflucan Take 1 tablet (150 mg total) by mouth once a week for 14 days.    fluticasone 50 MCG/ACT nasal spray Commonly known as: FLONASE Place 1 spray into both nostrils daily as needed for allergies.   gabapentin 300 MG capsule Commonly known as: NEURONTIN Take 1 capsule (300 mg total) by mouth 3 (three) times daily.   hydrOXYzine 10 MG tablet Commonly known as: ATARAX Take 1 tablet (10 mg total) by mouth 3 (three) times daily as needed for anxiety or itching.   ipratropium-albuterol 0.5-2.5 (3) MG/3ML Soln Commonly known as: DUONEB Take 3 mLs by nebulization every 6 (six) hours as needed (shortness of breath).   loratadine 10 MG tablet Commonly known as: CLARITIN Take 1 tablet (10 mg total) by mouth daily.   methocarbamol 500 MG tablet Commonly known as: ROBAXIN Take 1 tablet (500 mg total) by mouth every 6 (six) hours as needed for up to 7 days for muscle spasms.   montelukast 10 MG tablet Commonly known as: SINGULAIR Take 1 tablet (10 mg total) by mouth at bedtime.   pantoprazole 40 MG tablet Commonly known as: PROTONIX Take 1 tablet (40 mg total) by mouth daily.   predniSONE 10 MG tablet Commonly known as: DELTASONE 4 tabs for 3 days 3 tabs daily for 3 days 2 tabs daily for 3 days 1 tab daily for 3 days        Allergies  Allergen Reactions   Tomato Hives, Itching and Other (See Comments)    ALSO REACTS TO KETCHUP   Latex Itching and Rash   Wool Alcohol [Lanolin] Itching    Consultations: None   Procedures/Studies: DG Chest 2 View  Result Date: 01/31/2023 CLINICAL DATA:  Shortness of breath.  Central chest pain. EXAM: CHEST - 2 VIEW COMPARISON:  Chest radiographs 12/30/2022 and 08/27/2022 FINDINGS: Cardiac silhouette and mediastinal contours are within normal limits. The lungs are clear. No pleural effusion or pneumothorax. Mild-to-moderate multilevel degenerative disc changes of the thoracic spine. Unchanged posttraumatic spurring at the coracoclavicular ligament insertions on the lateral clavicle with unchanged moderate  clavicular head peripheral spurring and mild asymmetric widening of the right clavicular joint. IMPRESSION: 1. No active cardiopulmonary disease. 2. Unchanged posttraumatic changes of the right clavicle. Electronically Signed   By: Neita Garnet M.D.   On: 01/31/2023 13:59   (Echo, Carotid, EGD, Colonoscopy, ERCP)    Subjective: Patient seen and examined.  No overnight events.  Has some cough but most of the wheezing has improved.  She is walking around in the hallway.  Not using any oxygen.   Discharge Exam: Vitals:   02/03/23 0721 02/03/23 0723  BP:    Pulse:    Resp:    Temp:    SpO2: 96% 96%   Vitals:   02/02/23 2317 02/03/23 0555 02/03/23 0721 02/03/23 0723  BP:  130/80    Pulse: 75 81    Resp: 20 20    Temp:  98.1 F (36.7 C)    TempSrc:  Oral    SpO2: 98% 98% 96% 96%  Weight:  93.6 kg    Height:  General: Pt is alert, awake, not in acute distress Cardiovascular: RRR, S1/S2 +, no rubs, no gallops Respiratory: CTA bilaterally, occasional expiratory wheezes. Abdominal: Soft, NT, ND, bowel sounds + Extremities: no edema, no cyanosis    The results of significant diagnostics from this hospitalization (including imaging, microbiology, ancillary and laboratory) are listed below for reference.     Microbiology: Recent Results (from the past 240 hour(s))  Resp panel by RT-PCR (RSV, Flu A&B, Covid) Anterior Nasal Swab     Status: None   Collection Time: 01/31/23  4:00 PM   Specimen: Anterior Nasal Swab  Result Value Ref Range Status   SARS Coronavirus 2 by RT PCR NEGATIVE NEGATIVE Final   Influenza A by PCR NEGATIVE NEGATIVE Final   Influenza B by PCR NEGATIVE NEGATIVE Final    Comment: (NOTE) The Xpert Xpress SARS-CoV-2/FLU/RSV plus assay is intended as an aid in the diagnosis of influenza from Nasopharyngeal swab specimens and should not be used as a sole basis for treatment. Nasal washings and aspirates are unacceptable for Xpert Xpress  SARS-CoV-2/FLU/RSV testing.  Fact Sheet for Patients: BloggerCourse.com  Fact Sheet for Healthcare Providers: SeriousBroker.it  This test is not yet approved or cleared by the [place] FDA and has been authorized for detection and/or diagnosis of SARS-CoV-2 by FDA under an Emergency Use Authorization (EUA). This EUA will remain in effect (meaning this test can be used) for the duration of the COVID-19 declaration under Section 564(b)(1) of the Act, 21 U.S.C. section 360bbb-3(b)(1), unless the authorization is terminated or revoked.     Resp Syncytial Virus by PCR NEGATIVE NEGATIVE Final    Comment: (NOTE) Fact Sheet for Patients: BloggerCourse.com  Fact Sheet for Healthcare Providers: SeriousBroker.it  This test is not yet approved or cleared by the [place] FDA and has been authorized for detection and/or diagnosis of SARS-CoV-2 by FDA under an Emergency Use Authorization (EUA). This EUA will remain in effect (meaning this test can be used) for the duration of the COVID-19 declaration under Section 564(b)(1) of the Act, 21 U.S.C. section 360bbb-3(b)(1), unless the authorization is terminated or revoked.  Performed at [facility], [address], [place], [place] [address]      Labs: BNP (last 3 results) Recent Labs    12/30/22 1901  BNP 27.1   Basic Metabolic Panel: Recent Labs  Lab 01/31/23 1329  NA 139  K 3.6  CL 105  CO2 25  GLUCOSE 121*  BUN 9  CREATININE 0.83  CALCIUM 8.2*   Liver Function Tests: No results for input(s): "AST", "ALT", "ALKPHOS", "BILITOT", "PROT", "ALBUMIN" in the last 168 hours. No results for input(s): "LIPASE", "AMYLASE" in the last 168 hours. No results for input(s): "AMMONIA" in the last 168 hours. CBC: Recent Labs  Lab 01/31/23 1329  WBC 8.9  HGB 12.8  HCT 40.0  MCV 86.0  PLT 319   Cardiac Enzymes: No  results for input(s): "CKTOTAL", "CKMB", "CKMBINDEX", "TROPONINI" in the last 168 hours. BNP: Invalid input(s): "POCBNP" CBG: Recent Labs  Lab 01/31/23 1955  GLUCAP 273*   D-Dimer No results for input(s): "DDIMER" in the last 72 hours. Hgb A1c No results for input(s): "HGBA1C" in the last 72 hours. Lipid Profile No results for input(s): "CHOL", "HDL", "LDLCALC", "TRIG", "CHOLHDL", "LDLDIRECT" in the last 72 hours. Thyroid function studies No results for input(s): "TSH", "T4TOTAL", "T3FREE", "THYROIDAB" in the last 72 hours.  Invalid input(s): "FREET3" Anemia work up No results for input(s): "VITAMINB12", "FOLATE", "FERRITIN", "TIBC", "IRON", "  RETICCTPCT" in the last 72 hours. Urinalysis    Component Value Date/Time   COLORURINE YELLOW 05/29/2022 2230   APPEARANCEUR CLEAR 05/29/2022 2230   LABSPEC 1.023 05/29/2022 2230   PHURINE 5.0 05/29/2022 2230   GLUCOSEU NEGATIVE 05/29/2022 2230   GLUCOSEU NEGATIVE 02/10/2022 1130   HGBUR SMALL (A) 05/29/2022 2230   BILIRUBINUR NEGATIVE 05/29/2022 2230   BILIRUBINUR small 05/26/2017 1539   KETONESUR NEGATIVE 05/29/2022 2230   PROTEINUR NEGATIVE 05/29/2022 2230   UROBILINOGEN 0.2 02/10/2022 1130   NITRITE NEGATIVE 05/29/2022 2230   LEUKOCYTESUR NEGATIVE 05/29/2022 2230   Sepsis Labs Recent Labs  Lab 01/31/23 1329  WBC 8.9   Microbiology Recent Results (from the past 240 hour(s))  Resp panel by RT-PCR (RSV, Flu A&B, Covid) Anterior Nasal Swab     Status: None   Collection Time: 01/31/23  4:00 PM   Specimen: Anterior Nasal Swab  Result Value Ref Range Status   SARS Coronavirus 2 by RT PCR NEGATIVE NEGATIVE Final   Influenza A by PCR NEGATIVE NEGATIVE Final   Influenza B by PCR NEGATIVE NEGATIVE Final    Comment: (NOTE) The Xpert Xpress SARS-CoV-2/FLU/RSV plus assay is intended as an aid in the diagnosis of influenza from Nasopharyngeal swab specimens and should not be used as a sole basis for treatment. Nasal washings  and aspirates are unacceptable for Xpert Xpress SARS-CoV-2/FLU/RSV testing.  Fact Sheet for Patients: BloggerCourse.com  Fact Sheet for Healthcare Providers: SeriousBroker.it  This test is not yet approved or cleared by the [place] FDA and has been authorized for detection and/or diagnosis of SARS-CoV-2 by FDA under an Emergency Use Authorization (EUA). This EUA will remain in effect (meaning this test can be used) for the duration of the COVID-19 declaration under Section 564(b)(1) of the Act, 21 U.S.C. section 360bbb-3(b)(1), unless the authorization is terminated or revoked.     Resp Syncytial Virus by PCR NEGATIVE NEGATIVE Final    Comment: (NOTE) Fact Sheet for Patients: BloggerCourse.com  Fact Sheet for Healthcare Providers: SeriousBroker.it  This test is not yet approved or cleared by the [place] FDA and has been authorized for detection and/or diagnosis of SARS-CoV-2 by FDA under an Emergency Use Authorization (EUA). This EUA will remain in effect (meaning this test can be used) for the duration of the COVID-19 declaration under Section 564(b)(1) of the Act, 21 U.S.C. section 360bbb-3(b)(1), unless the authorization is terminated or revoked.  Performed at [facility], [address] [place], [place] [address]      Time coordinating discharge:  35 minutes  SIGNED:   Dorcas Carrow, MD  [facility] 02/03/2023, 8:32 AM

## 2023-02-06 ENCOUNTER — Telehealth: Payer: Self-pay | Admitting: *Deleted

## 2023-02-06 NOTE — Transitions of Care (Post Inpatient/ED Visit) (Signed)
   02/06/2023  Name: REXANNA TRUNNELL MRN: [id number] DOB: August 03, 1966  Today's TOC FU Call Status: Today's TOC FU Call Status:: Unsuccessul Call (1st Attempt) Unsuccessful Call (1st Attempt) Date: 02/06/23  Attempted to reach the patient regarding the most recent Inpatient/ED visit.  Follow Up Plan: Additional outreach attempts will be made to reach the patient to complete the Transitions of Care (Post Inpatient/ED visit) call.   Estanislado Emms RN, BSN [place]  Managed [facility] RN Care Coordinator [phone]

## 2023-03-03 ENCOUNTER — Encounter (HOSPITAL_COMMUNITY): Payer: Self-pay | Admitting: Emergency Medicine

## 2023-03-03 ENCOUNTER — Emergency Department (HOSPITAL_COMMUNITY): Payer: Medicaid Other

## 2023-03-03 ENCOUNTER — Inpatient Hospital Stay (HOSPITAL_COMMUNITY)
Admission: EM | Admit: 2023-03-03 | Discharge: 2023-03-07 | DRG: 190 | Disposition: A | Discharge status: Home / Self Care | Payer: Medicaid Other | Attending: Internal Medicine | Admitting: Internal Medicine

## 2023-03-03 ENCOUNTER — Other Ambulatory Visit: Payer: Self-pay

## 2023-03-03 DIAGNOSIS — E119 Type 2 diabetes mellitus without complications: Secondary | ICD-10-CM | POA: Diagnosis present

## 2023-03-03 DIAGNOSIS — Z7951 Long term (current) use of inhaled steroids: Secondary | ICD-10-CM

## 2023-03-03 DIAGNOSIS — Z830 Family history of human immunodeficiency virus [HIV] disease: Secondary | ICD-10-CM

## 2023-03-03 DIAGNOSIS — E669 Obesity, unspecified: Secondary | ICD-10-CM | POA: Diagnosis present

## 2023-03-03 DIAGNOSIS — F419 Anxiety disorder, unspecified: Secondary | ICD-10-CM | POA: Diagnosis present

## 2023-03-03 DIAGNOSIS — J45901 Unspecified asthma with (acute) exacerbation: Secondary | ICD-10-CM | POA: Diagnosis not present

## 2023-03-03 DIAGNOSIS — Z7984 Long term (current) use of oral hypoglycemic drugs: Secondary | ICD-10-CM

## 2023-03-03 DIAGNOSIS — Z91018 Allergy to other foods: Secondary | ICD-10-CM

## 2023-03-03 DIAGNOSIS — Z79899 Other long term (current) drug therapy: Secondary | ICD-10-CM

## 2023-03-03 DIAGNOSIS — B379 Candidiasis, unspecified: Secondary | ICD-10-CM | POA: Diagnosis not present

## 2023-03-03 DIAGNOSIS — Z825 Family history of asthma and other chronic lower respiratory diseases: Secondary | ICD-10-CM

## 2023-03-03 DIAGNOSIS — J441 Chronic obstructive pulmonary disease with (acute) exacerbation: Secondary | ICD-10-CM | POA: Diagnosis not present

## 2023-03-03 DIAGNOSIS — Z91040 Latex allergy status: Secondary | ICD-10-CM

## 2023-03-03 DIAGNOSIS — I10 Essential (primary) hypertension: Secondary | ICD-10-CM | POA: Diagnosis present

## 2023-03-03 DIAGNOSIS — I160 Hypertensive urgency: Secondary | ICD-10-CM | POA: Diagnosis present

## 2023-03-03 DIAGNOSIS — Z8249 Family history of ischemic heart disease and other diseases of the circulatory system: Secondary | ICD-10-CM

## 2023-03-03 DIAGNOSIS — Z8701 Personal history of pneumonia (recurrent): Secondary | ICD-10-CM

## 2023-03-03 DIAGNOSIS — J4551 Severe persistent asthma with (acute) exacerbation: Secondary | ICD-10-CM

## 2023-03-03 DIAGNOSIS — Z6833 Body mass index (BMI) 33.0-33.9, adult: Secondary | ICD-10-CM

## 2023-03-03 DIAGNOSIS — J9601 Acute respiratory failure with hypoxia: Secondary | ICD-10-CM | POA: Diagnosis present

## 2023-03-03 DIAGNOSIS — Z96652 Presence of left artificial knee joint: Secondary | ICD-10-CM | POA: Diagnosis present

## 2023-03-03 DIAGNOSIS — Z7982 Long term (current) use of aspirin: Secondary | ICD-10-CM

## 2023-03-03 DIAGNOSIS — R062 Wheezing: Secondary | ICD-10-CM | POA: Diagnosis not present

## 2023-03-03 DIAGNOSIS — K219 Gastro-esophageal reflux disease without esophagitis: Secondary | ICD-10-CM | POA: Diagnosis present

## 2023-03-03 DIAGNOSIS — R0902 Hypoxemia: Secondary | ICD-10-CM | POA: Diagnosis not present

## 2023-03-03 DIAGNOSIS — R0602 Shortness of breath: Secondary | ICD-10-CM | POA: Diagnosis not present

## 2023-03-03 DIAGNOSIS — Z91048 Other nonmedicinal substance allergy status: Secondary | ICD-10-CM

## 2023-03-03 DIAGNOSIS — Z87891 Personal history of nicotine dependence: Secondary | ICD-10-CM

## 2023-03-03 DIAGNOSIS — G4733 Obstructive sleep apnea (adult) (pediatric): Secondary | ICD-10-CM | POA: Diagnosis present

## 2023-03-03 DIAGNOSIS — T380X5A Adverse effect of glucocorticoids and synthetic analogues, initial encounter: Secondary | ICD-10-CM | POA: Diagnosis not present

## 2023-03-03 LAB — BLOOD GAS, VENOUS
Acid-Base Excess: 1.6 mmol/L (ref 0.0–2.0)
Bicarbonate: 28.9 mmol/L — ABNORMAL HIGH (ref 20.0–28.0)
O2 Saturation: 87.8 %
Patient temperature: 37
pCO2, Ven: 56 mmHg (ref 44–60)
pH, Ven: 7.32 (ref 7.25–7.43)
pO2, Ven: 57 mmHg — ABNORMAL HIGH (ref 32–45)

## 2023-03-03 LAB — CBG MONITORING, ED
Glucose-Capillary: 181 mg/dL — ABNORMAL HIGH (ref 70–99)
Glucose-Capillary: 198 mg/dL — ABNORMAL HIGH (ref 70–99)

## 2023-03-03 LAB — CBC WITH DIFFERENTIAL/PLATELET
Abs Immature Granulocytes: 0.01 10*3/uL (ref 0.00–0.07)
Basophils Absolute: 0.1 10*3/uL (ref 0.0–0.1)
Basophils Relative: 1 %
Eosinophils Absolute: 0.5 10*3/uL (ref 0.0–0.5)
Eosinophils Relative: 7 %
HCT: 41.3 % (ref 36.0–46.0)
Hemoglobin: 13 g/dL (ref 12.0–15.0)
Immature Granulocytes: 0 %
Lymphocytes Relative: 28 %
Lymphs Abs: 2.1 10*3/uL (ref 0.7–4.0)
MCH: 27.3 pg (ref 26.0–34.0)
MCHC: 31.5 g/dL (ref 30.0–36.0)
MCV: 86.6 fL (ref 80.0–100.0)
Monocytes Absolute: 0.6 10*3/uL (ref 0.1–1.0)
Monocytes Relative: 8 %
Neutro Abs: 4.2 10*3/uL (ref 1.7–7.7)
Neutrophils Relative %: 56 %
Platelets: 286 10*3/uL (ref 150–400)
RBC: 4.77 MIL/uL (ref 3.87–5.11)
RDW: 19.2 % — ABNORMAL HIGH (ref 11.5–15.5)
WBC: 7.4 10*3/uL (ref 4.0–10.5)
nRBC: 0 % (ref 0.0–0.2)

## 2023-03-03 LAB — BASIC METABOLIC PANEL
Anion gap: 8 (ref 5–15)
BUN: 10 mg/dL (ref 6–20)
CO2: 26 mmol/L (ref 22–32)
Calcium: 8.2 mg/dL — ABNORMAL LOW (ref 8.9–10.3)
Chloride: 106 mmol/L (ref 98–111)
Creatinine, Ser: 0.76 mg/dL (ref 0.44–1.00)
GFR, Estimated: >60 mL/min (ref >60)
Glucose, Bld: 121 mg/dL — ABNORMAL HIGH (ref 70–99)
Potassium: 4.6 mmol/L (ref 3.5–5.1)
Sodium: 140 mmol/L (ref 135–145)

## 2023-03-03 LAB — I-STAT CHEM 8, ED
BUN: 10 mg/dL (ref 6–20)
Calcium, Ion: 1.04 mmol/L — ABNORMAL LOW (ref 1.15–1.40)
Chloride: 106 mmol/L (ref 98–111)
Creatinine, Ser: 0.5 mg/dL (ref 0.44–1.00)
Glucose, Bld: 123 mg/dL — ABNORMAL HIGH (ref 70–99)
HCT: 42 % (ref 36.0–46.0)
Hemoglobin: 14.3 g/dL (ref 12.0–15.0)
Potassium: 4.7 mmol/L (ref 3.5–5.1)
Sodium: 141 mmol/L (ref 135–145)
TCO2: 26 mmol/L (ref 22–32)

## 2023-03-03 LAB — GLUCOSE, CAPILLARY: Glucose-Capillary: 140 mg/dL — ABNORMAL HIGH (ref 70–99)

## 2023-03-03 MED ORDER — ALBUTEROL SULFATE (2.5 MG/3ML) 0.083% IN NEBU
2.5000 mg | INHALATION_SOLUTION | RESPIRATORY_TRACT | Status: DC | PRN
  Administered 2023-03-03 – 2023-03-04 (×2): 2.5 mg via RESPIRATORY_TRACT
  Filled 2023-03-03 (×2): qty 3

## 2023-03-03 MED ORDER — CLONIDINE HCL 0.1 MG PO TABS
0.2000 mg | ORAL_TABLET | Freq: Two times a day (BID) | ORAL | Status: DC
  Administered 2023-03-03 – 2023-03-07 (×8): 0.2 mg via ORAL
  Filled 2023-03-03 (×9): qty 2

## 2023-03-03 MED ORDER — SALINE SPRAY 0.65 % NA SOLN
1.0000 | NASAL | Status: DC | PRN
  Filled 2023-03-03: qty 44

## 2023-03-03 MED ORDER — HYDROXYZINE HCL 10 MG PO TABS: 10.0000 mg | ORAL_TABLET | Freq: Three times a day (TID) | ORAL | Status: DC | PRN

## 2023-03-03 MED ORDER — HYDRALAZINE HCL 20 MG/ML IJ SOLN
5.0000 mg | Freq: Four times a day (QID) | INTRAMUSCULAR | Status: DC | PRN
  Administered 2023-03-03 – 2023-03-04 (×2): 5 mg via INTRAVENOUS
  Filled 2023-03-03 (×2): qty 1

## 2023-03-03 MED ORDER — ONDANSETRON HCL 4 MG/2ML IJ SOLN: 4.0000 mg | Freq: Four times a day (QID) | INTRAMUSCULAR | Status: DC | PRN

## 2023-03-03 MED ORDER — ENOXAPARIN SODIUM 40 MG/0.4ML IJ SOSY
40.0000 mg | PREFILLED_SYRINGE | INTRAMUSCULAR | Status: DC
  Administered 2023-03-03 – 2023-03-06 (×4): 40 mg via SUBCUTANEOUS
  Filled 2023-03-03 (×4): qty 0.4

## 2023-03-03 MED ORDER — ASPIRIN 81 MG PO TBEC
81.0000 mg | DELAYED_RELEASE_TABLET | Freq: Every day | ORAL | Status: DC
  Administered 2023-03-03 – 2023-03-07 (×5): 81 mg via ORAL
  Filled 2023-03-03 (×5): qty 1

## 2023-03-03 MED ORDER — METHOCARBAMOL 500 MG PO TABS
500.0000 mg | ORAL_TABLET | Freq: Three times a day (TID) | ORAL | Status: AC | PRN
  Administered 2023-03-03 – 2023-03-07 (×2): 500 mg via ORAL
  Filled 2023-03-03 (×2): qty 1

## 2023-03-03 MED ORDER — ACETAMINOPHEN 325 MG PO TABS: 650.0000 mg | ORAL_TABLET | Freq: Four times a day (QID) | ORAL | Status: DC | PRN

## 2023-03-03 MED ORDER — FLUTICASONE PROPIONATE 50 MCG/ACT NA SUSP
1.0000 | Freq: Every day | NASAL | Status: DC | PRN
  Filled 2023-03-03: qty 16

## 2023-03-03 MED ORDER — ACETAMINOPHEN 650 MG RE SUPP: 650.0000 mg | Freq: Four times a day (QID) | RECTAL | Status: DC | PRN

## 2023-03-03 MED ORDER — TRAZODONE HCL 50 MG PO TABS
25.0000 mg | ORAL_TABLET | Freq: Every evening | ORAL | Status: DC | PRN
  Filled 2023-03-03: qty 1

## 2023-03-03 MED ORDER — INSULIN ASPART 100 UNIT/ML IJ SOLN
0.0000 [IU] | Freq: Three times a day (TID) | INTRAMUSCULAR | Status: DC
  Administered 2023-03-03 – 2023-03-05 (×3): 3 [IU] via SUBCUTANEOUS
  Filled 2023-03-03: qty 0.15

## 2023-03-03 MED ORDER — ESCITALOPRAM OXALATE 10 MG PO TABS
10.0000 mg | ORAL_TABLET | Freq: Every day | ORAL | Status: DC
  Administered 2023-03-03 – 2023-03-07 (×5): 10 mg via ORAL
  Filled 2023-03-03 (×5): qty 1

## 2023-03-03 MED ORDER — ONDANSETRON HCL 4 MG PO TABS: 4.0000 mg | ORAL_TABLET | Freq: Four times a day (QID) | ORAL | Status: DC | PRN

## 2023-03-03 MED ORDER — INSULIN ASPART 100 UNIT/ML IJ SOLN
0.0000 [IU] | Freq: Every day | INTRAMUSCULAR | Status: DC
  Filled 2023-03-03: qty 0.05

## 2023-03-03 MED ORDER — PANTOPRAZOLE SODIUM 40 MG PO TBEC
40.0000 mg | DELAYED_RELEASE_TABLET | Freq: Every day | ORAL | Status: DC
  Administered 2023-03-03 – 2023-03-07 (×5): 40 mg via ORAL
  Filled 2023-03-03 (×5): qty 1

## 2023-03-03 MED ORDER — IPRATROPIUM-ALBUTEROL 0.5-2.5 (3) MG/3ML IN SOLN
3.0000 mL | Freq: Once | RESPIRATORY_TRACT | Status: AC
  Administered 2023-03-03: 3 mL via RESPIRATORY_TRACT
  Filled 2023-03-03: qty 3

## 2023-03-03 MED ORDER — METHYLPREDNISOLONE SODIUM SUCC 40 MG IJ SOLR
40.0000 mg | Freq: Two times a day (BID) | INTRAMUSCULAR | Status: DC
  Administered 2023-03-03 – 2023-03-05 (×4): 40 mg via INTRAVENOUS
  Filled 2023-03-03 (×4): qty 1

## 2023-03-03 MED ORDER — IPRATROPIUM-ALBUTEROL 0.5-2.5 (3) MG/3ML IN SOLN
3.0000 mL | Freq: Four times a day (QID) | RESPIRATORY_TRACT | Status: DC
  Administered 2023-03-03: 3 mL via RESPIRATORY_TRACT
  Filled 2023-03-03: qty 3

## 2023-03-03 NOTE — ED Triage Notes (Signed)
Pt BIB EMS from home, c/o SOB. Per EMS pt has increased work of breathing with wheezing x 3 days for unknown cause. Sleeps upright in chair, unable to lay flat. Received 2 duonebs and atrovent with some relief. 92% room, 100% sats after breathing tx. 125 mg solumedrol, 2 grams Mg  BP 170/100 CBG 148 Spo2 92-100% 22 RH

## 2023-03-03 NOTE — H&P (Signed)
History and Physical  HAZELL COMELLA Rodriguez:[id number] DOB: 1966-06-03 DOA: 03/03/2023  PCP: Hoy Register, MD   Chief Complaint: shortness of breath   HPI: Kathleen Rodriguez is a 57 y.o. female with medical history significant for COPD on room air, non-insulin-dependent type 2 diabetes, readmitted to the hospital with COPD exacerbation.  She was discharged from the hospital on 02/03/2019 3:04 day stay for COPD exacerbation and acute hypoxic respiratory failure.  She presented to the emergency department today complaints of shortness of breath for the last 3 days along with wheezing.  She has been sleeping upright in chair, unable to lay flat.  Using inhalers with minimal relief. No significant cough. EMS was called, she received 2 DuoNebs, Atrovent, 125 mg Solu-Medrol IV, 2 g IV magnesium.  She had minimal improvement with this.  Currently she is on nasal cannula oxygen 4L saturating 97%, breathing is slightly improved but still wheezing.  Denies chest pain, fevers, chills, nausea, vomiting.  No sputum production with cough.  ED Course: In the emergency department she was found to be hypertensive and tachycardic, was placed on 100% nonrebreather mask at 7 L/min.  Lab work and chest x-ray nonrevealing.  She was given DuoNeb, due to severe COPD exacerbation, continued wheezing and continued hypoxia, hospitalist was contacted for admission.  She has good bilateral  Review of Systems: Please see HPI for pertinent positives and negatives. A complete 10 system review of systems are otherwise negative.  Past Medical History:  Diagnosis Date   Arthritis    "knees, lower back; legs, ankles" (01/27/2016)   Asthma    followed by Dr. 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   Diabetes mellitus without complication (HCC)    GERD (gastroesophageal reflux disease)    Hypertension    "doctor took me off  RX in 2016" (01/27/2016)   Hypertension    Influenza B 01/2013   Complicated by multi-organ failure   OSA on CPAP "since " 03/20/2013   Pneumonia 2016   Required emergent intubation    asthma exacerbation in 2016   Skin ulcer (HCC) secondary to bullous impetigo /trauma 08/29/2018   Sleep apnea    Tobacco abuse    Upper airway cough syndrome    Past Surgical History:  Procedure Laterality Date   BREAST SURGERY Right 1980   as a teenager , cyst was benign   CESAREAN SECTION  2006   LACERATION REPAIR Right ~ 1997   "tried to cut myself"   TOTAL KNEE ARTHROPLASTY Left 09/01/2016   Procedure: LEFT TOTAL KNEE ARTHROPLASTY;  Surgeon: Tarry Kos, MD;  Location: [facility] OR;  Service: Orthopedics;  Laterality: Left;   TUBAL LIGATION  2006    Social History:  reports that she quit smoking about 10 years ago. Her smoking use included cigarettes. She started smoking about 31 years ago. She has a 5.25 pack-year smoking history. She has never used smokeless tobacco. She reports that she does not currently use drugs after having used the following drugs: Cocaine and "Crack" cocaine. No history on file for alcohol use.   Allergies  Allergen Reactions   Tomato Hives, Itching and Other (See Comments)    ALSO REACTS TO KETCHUP   Latex Itching and Rash   Wool Alcohol [Lanolin] Itching    Family History  Problem Relation Age of Onset  Asthma Mother    Heart murmur Mother    Cancer Maternal Grandmother    Heart disease Maternal Grandmother    Hypertension Maternal Grandmother    Cancer Paternal Grandmother    Hypertension Mother    HIV/AIDS Father      Prior to Admission medications   Medication Sig Start Date End Date Taking? Authorizing Provider  albuterol (PROAIR HFA) 108 (90 Base) MCG/ACT inhaler Inhale 2 puffs into the lungs every 6 (six) hours as needed for wheezing. 02/03/23 05/04/23  Dorcas Carrow, MD  aspirin EC 81 MG tablet Take 1 tablet (81 mg total) by mouth daily. 01/02/23 04/02/23   [place], Alvira Philips, DO  Blood Glucose Monitoring Suppl (ACCU-CHEK GUIDE ME) w/Device KIT Use to check blood sugar once daily. 05/04/21   Hoy Register, MD  Budeson-Glycopyrrol-Formoterol (BREZTRI AEROSPHERE) 160-9-4.8 MCG/ACT AERO Inhale 2 puffs into the lungs in the morning and at bedtime. 01/02/23   [place], Alvira Philips, DO  cloNIDine (CATAPRES) 0.2 MG tablet Take 1 tablet (0.2 mg total) by mouth 2 (two) times daily. For BP and hot flashes 01/02/23 04/02/23  [place], Alvira Philips, DO  escitalopram (LEXAPRO) 10 MG tablet Take 1 tablet (10 mg total) by mouth daily. 01/03/23 04/03/23  [place], Eric J, DO  fluticasone (FLONASE) 50 MCG/ACT nasal spray Place 1 spray into both nostrils daily as needed for allergies. 02/03/23 05/04/23  Dorcas Carrow, MD  gabapentin (NEURONTIN) 300 MG capsule Take 1 capsule (300 mg total) by mouth 3 (three) times daily. Patient not taking: Reported on 01/31/2023 01/02/23 04/02/23  [place], Eric J, DO  glucose blood (ACCU-CHEK GUIDE) test strip Use as instructed daily before breakfast. 10/05/21   Hoy Register, MD  hydrOXYzine (ATARAX) 10 MG tablet Take 1 tablet (10 mg total) by mouth 3 (three) times daily as needed for anxiety or itching. 02/03/23   Dorcas Carrow, MD  ipratropium-albuterol (DUONEB) 0.5-2.5 (3) MG/3ML SOLN Take 3 mLs by nebulization every 6 (six) hours as needed (shortness of breath). 01/02/23 04/02/23  [place], Alvira Philips, DO  loratadine (CLARITIN) 10 MG tablet Take 1 tablet (10 mg total) by mouth daily. 02/03/23 05/04/23  Dorcas Carrow, MD  montelukast (SINGULAIR) 10 MG tablet Take 1 tablet (10 mg total) by mouth at bedtime. 01/02/23 04/02/23  [place], Alvira Philips, DO  pantoprazole (PROTONIX) 40 MG tablet Take 1 tablet (40 mg total) by mouth daily. 01/02/23 04/02/23  [place], Alvira Philips, DO  predniSONE (DELTASONE) 10 MG tablet Take 4 tablets (40mg ) daily for 3 days,  3 tabs (30mg ) daily for 3 days,  2 tabs (20mg ) daily for 3 days, 1 tab daily for 3 days, then stop. 02/03/23   Dorcas Carrow, MD     Physical Exam: BP (!) 195/90   Pulse 99   Temp 97.7 F (36.5 C) (Axillary)   Resp (!) 21   Ht 5\' 5"  (1.651 m)   Wt 92.1 kg   SpO2 93%   BMI 33.78 kg/m   General:  Alert, oriented, calm, in no acute distress  Eyes: EOMI, clear conjuctivae, white sclerea Neck: supple, no masses, trachea mildline  Cardiovascular: RRR, no murmurs or rubs, no peripheral edema  Respiratory: Entry, with minimal tachypnea, not in acute respiratory distress.  There is diffuse rhonchi and wheezing.  Able to speak in full sentences. Abdomen: soft, nontender, nondistended, normal bowel tones heard  Skin: dry, no rashes  Musculoskeletal: no joint effusions, normal range of motion  Psychiatric: appropriate affect, normal speech  Neurologic: extraocular muscles intact, clear speech, moving  all extremities with intact sensorium          Labs on Admission:  Basic Metabolic Panel: Recent Labs  Lab 03/03/23 1527 03/03/23 1549  NA 140 141  K 4.6 4.7  CL 106 106  CO2 26  --   GLUCOSE 121* 123*  BUN 10 10  CREATININE 0.76 0.50  CALCIUM 8.2*  --    Liver Function Tests: No results for input(s): "AST", "ALT", "ALKPHOS", "BILITOT", "PROT", "ALBUMIN" in the last 168 hours. No results for input(s): "LIPASE", "AMYLASE" in the last 168 hours. No results for input(s): "AMMONIA" in the last 168 hours. CBC: Recent Labs  Lab 03/03/23 1303 03/03/23 1549  WBC 7.4  --   NEUTROABS 4.2  --   HGB 13.0 14.3  HCT 41.3 42.0  MCV 86.6  --   PLT 286  --    Cardiac Enzymes: No results for input(s): "CKTOTAL", "CKMB", "CKMBINDEX", "TROPONINI" in the last 168 hours.  BNP (last 3 results) Recent Labs    12/30/22 1901  BNP 27.1    ProBNP (last 3 results) No results for input(s): "PROBNP" in the last 8760 hours.  CBG: Recent Labs  Lab 03/03/23 1253  GLUCAP 181*    Radiological Exams on Admission: DG Chest 2 View  Result Date: 03/03/2023 CLINICAL DATA:  Shortness of breath and asthma EXAM: CHEST - 2  VIEW COMPARISON:  X-ray 01/31/2023 and older FINDINGS: No consolidation, pneumothorax or effusion. No edema. Normal cardiopericardial silhouette. Overlapping cardiac leads. IMPRESSION: No acute cardiopulmonary disease Electronically Signed   By: Karen Kays M.D.   On: 03/03/2023 14:13    Assessment/Plan Principal Problem:   COPD with acute exacerbation (HCC) with cough, no evidence of infection, she is gradually improving, but with continued hypoxic respiratory failure requiring oxygen, and continued wheezing. -Observation admission -Continue IV Solu-Medrol, scheduled DuoNebs, as needed albuterol -Pulmonary toilet with incentive spirometry and flutter valve -Wean oxygen as able, for goal O2 saturation 90% or greater    GERD (gastroesophageal reflux disease)-continue oral PPI   OSA (obstructive sleep apnea)-CPAP at night   Obesity (BMI 30-39.9)-complicating all aspects of care   Acute respiratory failure with hypoxia (HCC)  DVT prophylaxis: Lovenox     Code Status: Full Code  Consults called: None  Admission status: Obs   Time spent: 45 minutes  Jazzie Trampe Sharlette Dense MD [facility] Pager [phone]  If 7PM-7AM, please contact night-coverage [email or web address] Password [facility]  03/03/2023, 4:19 PM

## 2023-03-03 NOTE — Progress Notes (Signed)
RT note: Pt. made this RT aware of CPAP h/s use, which was conveyed to RN @~2150hrs., RT awaiting order.

## 2023-03-03 NOTE — ED Notes (Signed)
Pt ambulatory w/o assist. Steady gait. No LOB noted

## 2023-03-03 NOTE — ED Notes (Signed)
Pt noted with sats 88% room air, placed on 3 liters O2

## 2023-03-03 NOTE — ED Notes (Signed)
Pt ambulated to the bathroom.  Sats dropped to 86% on room air and her wheezing increased.  Pt went back to 91% after sitting on bed a couple of minutes.

## 2023-03-03 NOTE — ED Provider Notes (Signed)
[place] EMERGENCY DEPARTMENT AT [facility] Provider Note   CSN: [id number] Arrival date & time: 03/03/23  1243     History  Chief Complaint  Patient presents with   Shortness of Breath    Kathleen Rodriguez is a 57 y.o. female with asthma/COPD, OSA, diabetes, IDA, GERD, anxiety who presents with SOB.   Patient reports shortness of breath, increased work of breathing with wheezing x 3 days, think she is having an asthma exacerbation. Sleeps upright in chair, unable to lay flat.  Has been using her inhalers at home with minimal relief. From EMS received 2 duonebs, atrovent, 125 mg solumedrol IV, 2 grams Mg IV. She had minimal improvement with treatments given by EMS. Denies CP, f/c, N/V/D/C, lower extremity edema, recent hospitalization/surgery, h/o DVT/PE.     Shortness of Breath      Home Medications Prior to Admission medications   Medication Sig Start Date End Date Taking? Authorizing Provider  albuterol (PROAIR HFA) 108 (90 Base) MCG/ACT inhaler Inhale 2 puffs into the lungs every 6 (six) hours as needed for wheezing. 02/03/23 05/04/23  Dorcas Carrow, MD  aspirin EC 81 MG tablet Take 1 tablet (81 mg total) by mouth daily. 01/02/23 04/02/23  [place], Alvira Philips, DO  Blood Glucose Monitoring Suppl (ACCU-CHEK GUIDE ME) w/Device KIT Use to check blood sugar once daily. 05/04/21   Hoy Register, MD  Budeson-Glycopyrrol-Formoterol (BREZTRI AEROSPHERE) 160-9-4.8 MCG/ACT AERO Inhale 2 puffs into the lungs in the morning and at bedtime. 01/02/23   [place], Alvira Philips, DO  cloNIDine (CATAPRES) 0.2 MG tablet Take 1 tablet (0.2 mg total) by mouth 2 (two) times daily. For BP and hot flashes 01/02/23 04/02/23  [place], Alvira Philips, DO  escitalopram (LEXAPRO) 10 MG tablet Take 1 tablet (10 mg total) by mouth daily. 01/03/23 04/03/23  [place], Eric J, DO  fluticasone (FLONASE) 50 MCG/ACT nasal spray Place 1 spray into both nostrils daily as needed for allergies. 02/03/23 05/04/23  Dorcas Carrow, MD   gabapentin (NEURONTIN) 300 MG capsule Take 1 capsule (300 mg total) by mouth 3 (three) times daily. Patient not taking: Reported on 01/31/2023 01/02/23 04/02/23  [place], Eric J, DO  glucose blood (ACCU-CHEK GUIDE) test strip Use as instructed daily before breakfast. 10/05/21   Hoy Register, MD  hydrOXYzine (ATARAX) 10 MG tablet Take 1 tablet (10 mg total) by mouth 3 (three) times daily as needed for anxiety or itching. 02/03/23   Dorcas Carrow, MD  ipratropium-albuterol (DUONEB) 0.5-2.5 (3) MG/3ML SOLN Take 3 mLs by nebulization every 6 (six) hours as needed (shortness of breath). 01/02/23 04/02/23  [place], Alvira Philips, DO  loratadine (CLARITIN) 10 MG tablet Take 1 tablet (10 mg total) by mouth daily. 02/03/23 05/04/23  Dorcas Carrow, MD  montelukast (SINGULAIR) 10 MG tablet Take 1 tablet (10 mg total) by mouth at bedtime. 01/02/23 04/02/23  [place], Alvira Philips, DO  pantoprazole (PROTONIX) 40 MG tablet Take 1 tablet (40 mg total) by mouth daily. 01/02/23 04/02/23  [place], Alvira Philips, DO  predniSONE (DELTASONE) 10 MG tablet Take 4 tablets (40mg ) daily for 3 days,  3 tabs (30mg ) daily for 3 days,  2 tabs (20mg ) daily for 3 days, 1 tab daily for 3 days, then stop. 02/03/23   Dorcas Carrow, MD      Allergies    Tomato, Latex, and Wool alcohol [lanolin]    Review of Systems   Review of Systems  Respiratory:  Positive for shortness of breath.    Review of systems  Negative for f/c.  A 10 point review of systems was performed and is negative unless otherwise reported in HPI.  Physical Exam Updated Vital Signs BP (!) 195/90   Pulse 99   Temp 97.7 F (36.5 C) (Axillary)   Resp (!) 21   Ht 5\' 5"  (1.651 m)   Wt 92.1 kg   SpO2 93%   BMI 33.78 kg/m  Physical Exam General: Normal appearing female, lying in bed.  HEENT: Sclera anicteric, MMM, trachea midline.  Cardiology: regular tachycardic rate, no murmurs/rubs/gallops. BL radial and DP pulses equal bilaterally.  Resp: Tachypneic in the 20s, mildly increased work  of breathing.  Bilateral inspiratory and extra wheezes with poor air movement.  No crackles or rales. Abd: Soft, non-tender, non-distended. No rebound tenderness or guarding.  GU: Deferred. MSK: No peripheral edema or signs of trauma. Extremities without deformity or TTP. No cyanosis or clubbing. Skin: warm, dry.  Neuro: A&Ox4, CNs II-XII grossly intact. MAEs. Sensation grossly intact.  Psych: Normal mood and affect.   ED Results / Procedures / Treatments   Labs (all labs ordered are listed, but only abnormal results are displayed) Labs Reviewed  BLOOD GAS, VENOUS - Abnormal; Notable for the following components:      Result Value   pO2, Ven 57 (*)    Bicarbonate 28.9 (*)    All other components within normal limits  CBC WITH DIFFERENTIAL/PLATELET - Abnormal; Notable for the following components:   RDW 19.2 (*)    All other components within normal limits  BASIC METABOLIC PANEL - Abnormal; Notable for the following components:   Glucose, Bld 121 (*)    Calcium 8.2 (*)    All other components within normal limits  CBG MONITORING, ED - Abnormal; Notable for the following components:   Glucose-Capillary 181 (*)    All other components within normal limits  BRAIN NATRIURETIC PEPTIDE  CBC WITH DIFFERENTIAL/PLATELET  I-STAT CHEM 8, ED    EKG EKG Interpretation  Date/Time:  Friday Mar 03 2023 12:51:17 EDT Ventricular Rate:  103 PR Interval:  127 QRS Duration: 92 QT Interval:  353 QTC Calculation: 463 R Axis:   85 Text Interpretation: Sinus tachycardia Right atrial enlargement Left ventricular hypertrophy Similar to prior Confirmed by Vivi Barrack [phone]) on 03/03/2023 2:31:04 PM  Radiology DG Chest 2 View  Result Date: 03/03/2023 CLINICAL DATA:  Shortness of breath and asthma EXAM: CHEST - 2 VIEW COMPARISON:  X-ray 01/31/2023 and older FINDINGS: No consolidation, pneumothorax or effusion. No edema. Normal cardiopericardial silhouette. Overlapping cardiac leads. IMPRESSION: No  acute cardiopulmonary disease Electronically Signed   By: Karen Kays M.D.   On: 03/03/2023 14:13    Procedures Procedures    Medications Ordered in ED Medications  ipratropium-albuterol (DUONEB) 0.5-2.5 (3) MG/3ML nebulizer solution 3 mL (3 mLs Nebulization Given 03/03/23 1513)    ED Course/ Medical Decision Making/ A&P                          Medical Decision Making Amount and/or Complexity of Data Reviewed Labs: ordered. Decision-making details documented in ED Course. Radiology: ordered. Decision-making details documented in ED Course.  Risk Prescription drug management. Decision regarding hospitalization.    This patient presents to the ED for concern of dyspnea, this involves an extensive number of treatment options, and is a complaint that carries with it a high risk of complications and morbidity.  I considered the following differential and admission for this acute, potentially life  threatening condition.   MDM:    DDX for dyspnea includes but is not limited to:  Likely d/t asthma/COPD exacerbation, CXR doesn't demonstrate any PNA, pt is afebrile. W/ wheezing and some improvement w/ asthma treatments, lower c/f PE also no CP or signs/symptoms of DVT. Also consider HF exacerbation w/ inability to lie flat but no pitting edema or signs of volume overload, no rales/crackles on exam. Lower c/f ACS/arrhythmia, EKG w/o signs of ischemia, no CP. Will give additional duoneb, obtain labs including gas, and likely admit patient to medicine. On ambulation, patient's sats dipped to 86% on RA.   Clinical Course as of 03/03/23 1547  Fri Mar 03, 2023  1331 Glucose-Capillary(!): 181 [HN]  1430 DG Chest 2 View FINDINGS: No consolidation, pneumothorax or effusion. No edema. Normal cardiopericardial silhouette. Overlapping cardiac leads.  IMPRESSION: No acute cardiopulmonary disease   [HN]  1431 WBC: 7.4 No leukocytosis [HN]  1431 pH, Ven: 7.32 [HN]  1431 pCO2, Ven: 56 [HN]     Clinical Course User Index [HN] Loetta Rough, MD    Labs: I Ordered, and personally interpreted labs.  The pertinent results include:  those listed above  Imaging Studies ordered: I ordered imaging studies including CXR I independently visualized and interpreted imaging. I agree with the radiologist interpretation  Additional history obtained from chart review.    Cardiac Monitoring: The patient was maintained on a cardiac monitor.  I personally viewed and interpreted the cardiac monitored which showed an underlying rhythm of: sinus tachycardia  Reevaluation: After the interventions noted above, I reevaluated the patient and found that they have :stayed the same  Social Determinants of Health: Patient lives independently   Disposition:  Admit to medicine  Co morbidities that complicate the patient evaluation  Past Medical History:  Diagnosis Date   Arthritis    "knees, lower back; legs, ankles" (01/27/2016)   Asthma    followed by Dr. 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   Diabetes mellitus without complication (HCC)    GERD (gastroesophageal reflux disease)    Hypertension    "doctor took me off RX in 2016" (01/27/2016)   Hypertension    Influenza B 01/2013   Complicated by multi-organ failure   OSA on CPAP "since " 03/20/2013   Pneumonia 2016   Required emergent intubation    asthma exacerbation in 2016   Skin ulcer (HCC) secondary to bullous impetigo /trauma 08/29/2018   Sleep apnea    Tobacco abuse    Upper airway cough syndrome      Medicines Meds ordered this encounter  Medications   ipratropium-albuterol (DUONEB) 0.5-2.5 (3) MG/3ML nebulizer solution 3 mL    I have reviewed the patients home medicines and have made adjustments as needed  Problem List / ED Course: Problem List Items Addressed This Visit   None Visit Diagnoses      Exacerbation of asthma, unspecified asthma severity, unspecified whether persistent    -  Primary   Relevant Medications   ipratropium-albuterol (DUONEB) 0.5-2.5 (3) MG/3ML nebulizer solution 3 mL (Completed)                   This note was created using dictation software, which may contain spelling or grammatical errors.    Loetta Rough, MD 03/03/23 1550

## 2023-03-03 NOTE — ED Notes (Signed)
ED TO INPATIENT HANDOFF REPORT  ED Nurse Name and Phone #: Macie Burows [phone]  S Name/Rodriguez/Gender Kathleen Rodriguez 57 y.o. female Room/Bed: WA11/WA11  Code Status   Code Status: Full Code  Home/SNF/Other Home Patient oriented to: self, place, time, and situation Is this baseline? Yes   Triage Complete: Triage complete  Chief Complaint Acute asthma exacerbation [J45.901]  Triage Note Pt BIB EMS from home, c/o SOB. Per EMS pt has increased work of breathing with wheezing x 3 days for unknown cause. Sleeps upright in chair, unable to lay flat. Received 2 duonebs and atrovent with some relief. 92% room, 100% sats after breathing tx. 125 mg solumedrol, 2 grams Mg  BP 170/100 CBG 148 Spo2 92-100% 22 RH   Allergies Allergies  Allergen Reactions   Tomato Hives, Itching and Other (See Comments)    ALSO REACTS TO KETCHUP   Latex Itching and Rash   Wool Alcohol [Lanolin] Itching    Level of Care/Admitting Diagnosis ED Disposition     ED Disposition  Admit   Condition  --   Comment  Hospital Area: [facility] [[id number]]  Level of Care: Telemetry [5]  Admit to tele based on following criteria: Other see comments  Comments: hypertension  May place patient in observation at [facility] or [facility] if equivalent level of care is available:: Yes  Covid Evaluation: Asymptomatic - no recent exposure (last 10 days) testing not required  Diagnosis: Acute asthma exacerbation [454098]  Admitting Physician: Maryln Gottron [[id number]]  Attending Physician: Kirby Crigler, MIR M [[id number]]          B Medical/Surgery History Past Medical History:  Diagnosis Date   Arthritis    "knees, lower back; legs, ankles" (01/27/2016)   Asthma    followed by Dr. 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   Diabetes mellitus without complication (HCC)    GERD  (gastroesophageal reflux disease)    Hypertension    "doctor took me off RX in 2016" (01/27/2016)   Hypertension    Influenza B 01/2013   Complicated by multi-organ failure   OSA on CPAP "since " 03/20/2013   Pneumonia 2016   Required emergent intubation    asthma exacerbation in 2016   Skin ulcer (HCC) secondary to bullous impetigo /trauma 08/29/2018   Sleep apnea    Tobacco abuse    Upper airway cough syndrome    Past Surgical History:  Procedure Laterality Date   BREAST SURGERY Right 1980   as a teenager , cyst was benign   CESAREAN SECTION  2006   LACERATION REPAIR Right ~ 1997   "tried to cut myself"   TOTAL KNEE ARTHROPLASTY Left 09/01/2016   Procedure: LEFT TOTAL KNEE ARTHROPLASTY;  Surgeon: Tarry Kos, MD;  Location: [facility] OR;  Service: Orthopedics;  Laterality: Left;   TUBAL LIGATION  2006     A IV Location/Drains/Wounds Patient Lines/Drains/Airways Status     Active Line/Drains/Airways     None            Intake/Output Last 24 hours No intake or output data in the 24 hours ending 03/03/23 1741  Labs/Imaging Results for orders placed or performed during the hospital encounter of 03/03/23 (from the past 48 hour(s))  CBG monitoring, ED     Status: Abnormal   Collection Time: 03/03/23 12:53  PM  Result Value Ref Range   Glucose-Capillary 181 (H) 70 - 99 mg/dL    Comment: Glucose reference range applies only to samples taken after fasting for at least 8 hours.  Blood gas, venous (at [facility] and [facility])     Status: Abnormal   Collection Time: 03/03/23  1:03 PM  Result Value Ref Range   pH, Ven 7.32 7.25 - 7.43   pCO2, Ven 56 44 - 60 mmHg   pO2, Ven 57 (H) 32 - 45 mmHg   Bicarbonate 28.9 (H) 20.0 - 28.0 mmol/L   Acid-Base Excess 1.6 0.0 - 2.0 mmol/L   O2 Saturation 87.8 %   Patient temperature 37.0     Comment: Performed at [facility], [address] [place], [place] [address]  CBC with Differential/Platelet     Status: Abnormal   Collection  Time: 03/03/23  1:03 PM  Result Value Ref Range   WBC 7.4 4.0 - 10.5 K/uL   RBC 4.77 3.87 - 5.11 MIL/uL   Hemoglobin 13.0 12.0 - 15.0 g/dL   HCT 19.1 47.8 - 29.5 %   MCV 86.6 80.0 - 100.0 fL   MCH 27.3 26.0 - 34.0 pg   MCHC 31.5 30.0 - 36.0 g/dL   RDW 62.1 (H) 30.8 - 65.7 %   Platelets 286 150 - 400 K/uL   nRBC 0.0 0.0 - 0.2 %   Neutrophils Relative % 56 %   Neutro Abs 4.2 1.7 - 7.7 K/uL   Lymphocytes Relative 28 %   Lymphs Abs 2.1 0.7 - 4.0 K/uL   Monocytes Relative 8 %   Monocytes Absolute 0.6 0.1 - 1.0 K/uL   Eosinophils Relative 7 %   Eosinophils Absolute 0.5 0.0 - 0.5 K/uL   Basophils Relative 1 %   Basophils Absolute 0.1 0.0 - 0.1 K/uL   Immature Granulocytes 0 %   Abs Immature Granulocytes 0.01 0.00 - 0.07 K/uL    Comment: Performed at [facility], [address], [place], [place] [address]  Basic metabolic panel     Status: Abnormal   Collection Time: 03/03/23  3:27 PM  Result Value Ref Range   Sodium 140 135 - 145 mmol/L   Potassium 4.6 3.5 - 5.1 mmol/L    Comment: HEMOLYSIS AT THIS LEVEL MAY AFFECT RESULT   Chloride 106 98 - 111 mmol/L   CO2 26 22 - 32 mmol/L   Glucose, Bld 121 (H) 70 - 99 mg/dL    Comment: Glucose reference range applies only to samples taken after fasting for at least 8 hours.   BUN 10 6 - 20 mg/dL   Creatinine, Ser 2.95 0.44 - 1.00 mg/dL   Calcium 8.2 (L) 8.9 - 10.3 mg/dL   GFR, Estimated >28 >41 mL/min    Comment: (NOTE) Calculated using the CKD-EPI Creatinine Equation (2021)    Anion gap 8 5 - 15    Comment: Performed at [facility], [address] [place], [place] [address]  I-stat chem 8, ED (not at [facility], [facility] or [facility])     Status: Abnormal   Collection Time: 03/03/23  3:49 PM  Result Value Ref Range   Sodium 141 135 - 145 mmol/L   Potassium 4.7 3.5 - 5.1 mmol/L   Chloride 106 98 - 111 mmol/L   BUN 10 6 - 20 mg/dL   Creatinine, Ser 1.02 0.44 - 1.00 mg/dL   Glucose, Bld 725 (H) 70 - 99 mg/dL  Comment: Glucose reference range applies only to samples taken after fasting for at least 8 hours.   Calcium, Ion 1.04 (L) 1.15 - 1.40 mmol/L   TCO2 26 22 - 32 mmol/L   Hemoglobin 14.3 12.0 - 15.0 g/dL   HCT 40.9 81.1 - 91.4 %  CBG monitoring, ED     Status: Abnormal   Collection Time: 03/03/23  4:47 PM  Result Value Ref Range   Glucose-Capillary 198 (H) 70 - 99 mg/dL    Comment: Glucose reference range applies only to samples taken after fasting for at least 8 hours.   DG Chest 2 View  Result Date: 03/03/2023 CLINICAL DATA:  Shortness of breath and asthma EXAM: CHEST - 2 VIEW COMPARISON:  X-ray 01/31/2023 and older FINDINGS: No consolidation, pneumothorax or effusion. No edema. Normal cardiopericardial silhouette. Overlapping cardiac leads. IMPRESSION: No acute cardiopulmonary disease Electronically Signed   By: Karen Kays M.D.   On: 03/03/2023 14:13    Pending Labs Unresulted Labs (From admission, onward)     Start     Ordered   03/04/23 0500  Basic metabolic panel  Tomorrow morning,   R        03/03/23 1619   03/04/23 0500  CBC  Tomorrow morning,   R        03/03/23 1619   03/03/23 1333  Brain natriuretic peptide  (ED Shortness of Breath)  Once,   URGENT        03/03/23 1332   03/03/23 1333  CBC with Differential  Once,   STAT        03/03/23 1332            Vitals/Pain Today's Vitals   03/03/23 1530 03/03/23 1535 03/03/23 1643 03/03/23 1700  BP: (!) 195/90   (!) 187/92  Pulse: (!) 104 99  (!) 110  Resp: (!) 21 (!) 21  (!) 22  Temp:   97.9 F (36.6 C)   TempSrc:   Oral   SpO2: (!) 89% 93%  97%  Weight:      Height:        Isolation Precautions No active isolations  Medications Medications  ipratropium-albuterol (DUONEB) 0.5-2.5 (3) MG/3ML nebulizer solution 3 mL (3 mLs Nebulization Not Given 03/03/23 1631)  methylPREDNISolone sodium succinate (SOLU-MEDROL) 40 mg/mL injection 40 mg (has no administration in time range)  aspirin EC tablet 81 mg (has no  administration in time range)  cloNIDine (CATAPRES) tablet 0.2 mg (has no administration in time range)  escitalopram (LEXAPRO) tablet 10 mg (has no administration in time range)  hydrOXYzine (ATARAX) tablet 10 mg (has no administration in time range)  pantoprazole (PROTONIX) EC tablet 40 mg (has no administration in time range)  fluticasone (FLONASE) 50 MCG/ACT nasal spray 1 spray (has no administration in time range)  enoxaparin (LOVENOX) injection 40 mg (has no administration in time range)  insulin aspart (novoLOG) injection 0-15 Units (has no administration in time range)  insulin aspart (novoLOG) injection 0-5 Units (has no administration in time range)  acetaminophen (TYLENOL) tablet 650 mg (has no administration in time range)    Or  acetaminophen (TYLENOL) suppository 650 mg (has no administration in time range)  traZODone (DESYREL) tablet 25 mg (has no administration in time range)  ondansetron (ZOFRAN) tablet 4 mg (has no administration in time range)    Or  ondansetron (ZOFRAN) injection 4 mg (has no administration in time range)  albuterol (PROVENTIL) (2.5 MG/3ML) 0.083% nebulizer solution 2.5 mg (has no administration in  time range)  hydrALAZINE (APRESOLINE) injection 5 mg (has no administration in time range)  ipratropium-albuterol (DUONEB) 0.5-2.5 (3) MG/3ML nebulizer solution 3 mL (3 mLs Nebulization Given 03/03/23 1513)    Mobility walks     Focused Assessments Pulmonary Assessment Handoff:  Lung sounds: Bilateral Breath Sounds: Expiratory wheezes O2 Device: NRB O2 Flow Rate (L/min): 7 L/min    R Recommendations: See Admitting Provider Note  Report given to: Jon Gills, RN  Additional Notes:N/A

## 2023-03-04 DIAGNOSIS — Z79899 Other long term (current) drug therapy: Secondary | ICD-10-CM | POA: Diagnosis not present

## 2023-03-04 DIAGNOSIS — Z91040 Latex allergy status: Secondary | ICD-10-CM | POA: Diagnosis not present

## 2023-03-04 DIAGNOSIS — E669 Obesity, unspecified: Secondary | ICD-10-CM | POA: Diagnosis not present

## 2023-03-04 DIAGNOSIS — G4733 Obstructive sleep apnea (adult) (pediatric): Secondary | ICD-10-CM | POA: Diagnosis not present

## 2023-03-04 DIAGNOSIS — B379 Candidiasis, unspecified: Secondary | ICD-10-CM | POA: Diagnosis not present

## 2023-03-04 DIAGNOSIS — Z825 Family history of asthma and other chronic lower respiratory diseases: Secondary | ICD-10-CM | POA: Diagnosis not present

## 2023-03-04 DIAGNOSIS — J441 Chronic obstructive pulmonary disease with (acute) exacerbation: Secondary | ICD-10-CM | POA: Diagnosis not present

## 2023-03-04 DIAGNOSIS — Z8701 Personal history of pneumonia (recurrent): Secondary | ICD-10-CM | POA: Diagnosis not present

## 2023-03-04 DIAGNOSIS — Z96652 Presence of left artificial knee joint: Secondary | ICD-10-CM | POA: Diagnosis not present

## 2023-03-04 DIAGNOSIS — I10 Essential (primary) hypertension: Secondary | ICD-10-CM | POA: Diagnosis not present

## 2023-03-04 DIAGNOSIS — E119 Type 2 diabetes mellitus without complications: Secondary | ICD-10-CM | POA: Diagnosis not present

## 2023-03-04 DIAGNOSIS — Z87891 Personal history of nicotine dependence: Secondary | ICD-10-CM | POA: Diagnosis not present

## 2023-03-04 DIAGNOSIS — I160 Hypertensive urgency: Secondary | ICD-10-CM | POA: Diagnosis not present

## 2023-03-04 DIAGNOSIS — R0602 Shortness of breath: Secondary | ICD-10-CM | POA: Diagnosis not present

## 2023-03-04 DIAGNOSIS — K219 Gastro-esophageal reflux disease without esophagitis: Secondary | ICD-10-CM | POA: Diagnosis not present

## 2023-03-04 DIAGNOSIS — Z6833 Body mass index (BMI) 33.0-33.9, adult: Secondary | ICD-10-CM | POA: Diagnosis not present

## 2023-03-04 DIAGNOSIS — Z830 Family history of human immunodeficiency virus [HIV] disease: Secondary | ICD-10-CM | POA: Diagnosis not present

## 2023-03-04 DIAGNOSIS — Z8249 Family history of ischemic heart disease and other diseases of the circulatory system: Secondary | ICD-10-CM | POA: Diagnosis not present

## 2023-03-04 DIAGNOSIS — T380X5A Adverse effect of glucocorticoids and synthetic analogues, initial encounter: Secondary | ICD-10-CM | POA: Diagnosis not present

## 2023-03-04 DIAGNOSIS — Z7984 Long term (current) use of oral hypoglycemic drugs: Secondary | ICD-10-CM | POA: Diagnosis not present

## 2023-03-04 DIAGNOSIS — J45901 Unspecified asthma with (acute) exacerbation: Secondary | ICD-10-CM | POA: Diagnosis not present

## 2023-03-04 DIAGNOSIS — Z7951 Long term (current) use of inhaled steroids: Secondary | ICD-10-CM | POA: Diagnosis not present

## 2023-03-04 DIAGNOSIS — J9601 Acute respiratory failure with hypoxia: Secondary | ICD-10-CM | POA: Diagnosis not present

## 2023-03-04 DIAGNOSIS — Z7982 Long term (current) use of aspirin: Secondary | ICD-10-CM | POA: Diagnosis not present

## 2023-03-04 DIAGNOSIS — F419 Anxiety disorder, unspecified: Secondary | ICD-10-CM | POA: Diagnosis not present

## 2023-03-04 LAB — BASIC METABOLIC PANEL
Anion gap: 9 (ref 5–15)
BUN: 14 mg/dL (ref 6–20)
CO2: 24 mmol/L (ref 22–32)
Calcium: 8.6 mg/dL — ABNORMAL LOW (ref 8.9–10.3)
Chloride: 105 mmol/L (ref 98–111)
Creatinine, Ser: 0.66 mg/dL (ref 0.44–1.00)
GFR, Estimated: >60 mL/min (ref >60)
Glucose, Bld: 96 mg/dL (ref 70–99)
Potassium: 4.6 mmol/L (ref 3.5–5.1)
Sodium: 138 mmol/L (ref 135–145)

## 2023-03-04 LAB — RAPID URINE DRUG SCREEN, HOSP PERFORMED
Amphetamines: NOT DETECTED
Barbiturates: NOT DETECTED
Benzodiazepines: NOT DETECTED
Cocaine: POSITIVE — AB
Opiates: NOT DETECTED
Tetrahydrocannabinol: NOT DETECTED

## 2023-03-04 LAB — CBC
HCT: 40.4 % (ref 36.0–46.0)
Hemoglobin: 12.9 g/dL (ref 12.0–15.0)
MCH: 27.6 pg (ref 26.0–34.0)
MCHC: 31.9 g/dL (ref 30.0–36.0)
MCV: 86.3 fL (ref 80.0–100.0)
Platelets: 284 10*3/uL (ref 150–400)
RBC: 4.68 MIL/uL (ref 3.87–5.11)
RDW: 18.9 % — ABNORMAL HIGH (ref 11.5–15.5)
WBC: 7.1 10*3/uL (ref 4.0–10.5)
nRBC: 0 % (ref 0.0–0.2)

## 2023-03-04 LAB — GLUCOSE, CAPILLARY
Glucose-Capillary: 160 mg/dL — ABNORMAL HIGH (ref 70–99)
Glucose-Capillary: 118 mg/dL — ABNORMAL HIGH (ref 70–99)
Glucose-Capillary: 117 mg/dL — ABNORMAL HIGH (ref 70–99)
Glucose-Capillary: 140 mg/dL — ABNORMAL HIGH (ref 70–99)

## 2023-03-04 LAB — PROCALCITONIN: Procalcitonin: <0.1 ng/mL

## 2023-03-04 LAB — BRAIN NATRIURETIC PEPTIDE: B Natriuretic Peptide: 31.9 pg/mL (ref 0.0–100.0)

## 2023-03-04 MED ORDER — IPRATROPIUM-ALBUTEROL 0.5-2.5 (3) MG/3ML IN SOLN
3.0000 mL | RESPIRATORY_TRACT | Status: DC
  Administered 2023-03-04 – 2023-03-05 (×6): 3 mL via RESPIRATORY_TRACT
  Filled 2023-03-04 (×6): qty 3

## 2023-03-04 NOTE — Progress Notes (Signed)
SATURATION QUALIFICATIONS: (This note is used to comply with regulatory documentation for home oxygen)  Patient Saturations on Room Air at Rest = 92%  Patient Saturations on Room Air while Ambulating = 92%   

## 2023-03-04 NOTE — Progress Notes (Signed)
  Transition of Care [facility]) Screening Note   Patient Details  Name: CURRY GRUHLKE Date of Birth: 06/26/1966   [facility] of [facility]) CM/SW Contact:    Adrian Prows, RN Phone Number: 03/04/2023, 4:41 PM    Transition of Care Department [facility]) has reviewed patient and no TOC needs have been identified at this time. We will continue to monitor patient advancement through interdisciplinary progression rounds. If new patient transition needs arise, please place a TOC consult.

## 2023-03-04 NOTE — Progress Notes (Signed)
[facility] Progress Note  Patient: Kathleen Rodriguez    GNF:[id number]  DOA: 03/03/2023    Date of Service: the patient was seen and examined on 03/04/2023  Brief hospital course: 57 year old female with past medical history of COPD (not on oxygen), diabetes mellitus type 2, obesity and previous drug use who presented to the emergency room on 5/10 with complaints of shortness of breath and wheezing x 3 days.  Patient noted to be hypoxic initially requiring as much as 7 L by nonrebreather mask to keep oxygen saturations above 90%.  She was given nebulizers and steroids.  Chest x-ray and BNP were unremarkable.  Patient was brought into the hospitalist service for further evaluation.   Assessment and Plan: COPD exacerbation: Still quite severe.  Down to 2 L, but still dyspneic and gets winded just talking.  Audible wheezing.  Continue IV steroids plus nebulizers plus supplemental oxygen.  Patient states that she ran out of her Elwin Sleight and was using her albuterol around-the-clock.  She is also not seen her pulmonologist for some time.  Upon discharge, will ensure that she is set up.  Given normal procalcitonin, no need for antibiotics  Diabetes mellitus: Monitor CBGs especially while on Solu-Medrol.  Obesity: Meets criteria with BMI greater than 30  Hypertensive urgency: Elevated blood pressure on admission, in part due to hypoxia.  Blood pressures improving.  Continue home medications.    Body mass index is 33.78 kg/m.        Consultants: None  Procedures: None  Antimicrobials: None  Code Status: Full code   Subjective: Feels a little bit better with her breathing, still impaired  Objective: Vital signs were reviewed and unremarkable. Vitals:   03/04/23 0758 03/04/23 0815  BP:  (!) 158/81  Pulse:  99  Resp:  (!) 21  Temp:  98 F (36.7 C)  SpO2: 97% 98%    Intake/Output Summary (Last 24 hours) at 03/04/2023 0837 Last data filed at 03/04/2023 0815 Gross per 24 hour   Intake 120 ml  Output --  Net 120 ml   Filed Weights   03/03/23 1254  Weight: 92.1 kg   Body mass index is 33.78 kg/m.  Exam:  General: Alert and oriented x 3, no acute distress HEENT: Normocephalic, atraumatic, mucous membranes are moist Cardiovascular: Regular rate and rhythm, S1-S2 Respiratory: Significant end expiratory wheezing, limited inspiratory effort, no rhonchi Abdomen: Soft, nontender, nondistended, positive bowel sounds Musculoskeletal: No clubbing or cyanosis or edema Skin: No skin breaks, tears or lesions Psychiatry: Appropriate, no evidence of psychoses Neurology: No focal deficits  Data Reviewed: Normal procalcitonin, normal basic metabolic panel and CBC  Disposition:  Status is: Inpatient    Anticipated discharge date: 5/13  Remaining issues to be resolved so that patient can be discharged:  -Improvement in dyspnea and wheezing   Family Communication: Declined for me to call anyone DVT Prophylaxis: enoxaparin (LOVENOX) injection 40 mg Start: 03/03/23 2200 SCDs Start: 03/03/23 1618    Author: Hollice Espy ,MD 03/04/2023 8:37 AM  To reach On-call, see care teams to locate the attending and reach out via [email or web address]. Between 7PM-7AM, please contact night-coverage If you still have difficulty reaching the attending provider, please page the [facility] (Director on Call) for [facility] on amion for assistance.

## 2023-03-04 NOTE — Hospital Course (Signed)
57 year old female with past medical history of COPD (not on oxygen), diabetes mellitus type 2, obesity and previous drug use who presented to the emergency room on 5/10 with complaints of shortness of breath and wheezing x 3 days.  Patient noted to be hypoxic initially requiring as much as 7 L by nonrebreather mask to keep oxygen saturations above 90%.  She was given nebulizers and steroids.  Chest x-ray and BNP were unremarkable.  Patient was brought into the hospitalist service for further evaluation.

## 2023-03-05 LAB — GLUCOSE, CAPILLARY
Glucose-Capillary: 160 mg/dL — ABNORMAL HIGH (ref 70–99)
Glucose-Capillary: 111 mg/dL — ABNORMAL HIGH (ref 70–99)
Glucose-Capillary: 120 mg/dL — ABNORMAL HIGH (ref 70–99)
Glucose-Capillary: 135 mg/dL — ABNORMAL HIGH (ref 70–99)

## 2023-03-05 MED ORDER — IPRATROPIUM-ALBUTEROL 0.5-2.5 (3) MG/3ML IN SOLN
3.0000 mL | Freq: Four times a day (QID) | RESPIRATORY_TRACT | Status: DC
  Administered 2023-03-05 – 2023-03-06 (×5): 3 mL via RESPIRATORY_TRACT
  Filled 2023-03-05 (×6): qty 3

## 2023-03-05 MED ORDER — FLUCONAZOLE 150 MG PO TABS
150.0000 mg | ORAL_TABLET | Freq: Every day | ORAL | Status: DC
  Administered 2023-03-05 – 2023-03-07 (×3): 150 mg via ORAL
  Filled 2023-03-05 (×3): qty 1

## 2023-03-05 MED ORDER — METHYLPREDNISOLONE SODIUM SUCC 40 MG IJ SOLR
20.0000 mg | Freq: Two times a day (BID) | INTRAMUSCULAR | Status: AC
  Administered 2023-03-05 – 2023-03-06 (×3): 20 mg via INTRAVENOUS
  Filled 2023-03-05 (×3): qty 1

## 2023-03-05 NOTE — Progress Notes (Signed)
The Patient would like to speak with someone about housing options.As she is going to be evicted soon.

## 2023-03-05 NOTE — Progress Notes (Addendum)
[facility] Progress Note  Patient: Kathleen Rodriguez    WGN:[id number]  DOA: 03/03/2023    Date of Service: the patient was seen and examined on 03/05/2023  Brief hospital course: 57 year old female with past medical history of COPD (not on oxygen), diabetes mellitus type 2, obesity and previous drug use who presented to the emergency room on 5/10 with complaints of shortness of breath and wheezing x 3 days.  Patient noted to be hypoxic initially requiring as much as 7 L by nonrebreather mask to keep oxygen saturations above 90%.  She was given nebulizers and steroids.  Chest x-ray and BNP were unremarkable.  Patient was brought into the hospitalist service for further evaluation.   Assessment and Plan: COPD exacerbation: Improving, able to be weaned down to room air, but still somewhat dyspneic.  Still significant audible wheezing.  Continue nebulizers and will titrate down steroids.  Will have her follow-up soon as outpatient with pulmonary.  Given normal procalcitonin, no need for antibiotics  Diabetes mellitus: Monitor CBGs especially while on Solu-Medrol.  Stable  Obesity: Meets criteria with BMI greater than 30  Hypertensive urgency: Elevated blood pressure on admission, in part due to hypoxia.  Blood pressures continue to improve.  Continue home medications.  Yeast infection: Secondary to steroids.  1 dose of Diflucan  Body mass index is 33.78 kg/m.        Consultants: None  Procedures: None  Antimicrobials: None  Code Status: Full code   Subjective: Breathing improved although still wheezing.  Complains of some mild discomfort from yeast infection  Objective: Vital signs were reviewed and unremarkable. Vitals:   03/05/23 0738 03/05/23 0829  BP:  137/71  Pulse:  80  Resp:  20  Temp:  97.8 F (36.6 C)  SpO2: 94% 98%    Intake/Output Summary (Last 24 hours) at 03/05/2023 1146 Last data filed at 03/05/2023 0852 Gross per 24 hour  Intake 840 ml  Output 800 ml   Net 40 ml    Filed Weights   03/03/23 1254  Weight: 92.1 kg   Body mass index is 33.78 kg/m.  Exam:  General: Alert and oriented x 3, no acute distress HEENT: Normocephalic, atraumatic, mucous membranes are moist Cardiovascular: Regular rate and rhythm, S1-S2 Respiratory: Bilateral end expiratory wheezing, improved from previous day Abdomen: Soft, nontender, nondistended, positive bowel sounds Musculoskeletal: No clubbing or cyanosis or edema Skin: No skin breaks, tears or lesions Psychiatry: Appropriate, no evidence of psychoses Neurology: No focal deficits  Data Reviewed: Reviewed CBGs  Disposition:  Status is: Inpatient    Anticipated discharge date: 5/13  Remaining issues to be resolved so that patient can be discharged:  -Improvement in dyspnea and wheezing   Family Communication: Declined for me to call anyone DVT Prophylaxis: enoxaparin (LOVENOX) injection 40 mg Start: 03/03/23 2200 SCDs Start: 03/03/23 1618    Author: Hollice Espy ,MD 03/05/2023 11:46 AM  To reach On-call, see care teams to locate the attending and reach out via [email or web address]. Between 7PM-7AM, please contact night-coverage If you still have difficulty reaching the attending provider, please page the [facility] (Director on Call) for [facility] on amion for assistance.

## 2023-03-06 DIAGNOSIS — J441 Chronic obstructive pulmonary disease with (acute) exacerbation: Secondary | ICD-10-CM

## 2023-03-06 DIAGNOSIS — E669 Obesity, unspecified: Secondary | ICD-10-CM | POA: Diagnosis not present

## 2023-03-06 DIAGNOSIS — J9601 Acute respiratory failure with hypoxia: Secondary | ICD-10-CM

## 2023-03-06 DIAGNOSIS — G4733 Obstructive sleep apnea (adult) (pediatric): Secondary | ICD-10-CM

## 2023-03-06 LAB — GLUCOSE, CAPILLARY
Glucose-Capillary: 94 mg/dL (ref 70–99)
Glucose-Capillary: 112 mg/dL — ABNORMAL HIGH (ref 70–99)
Glucose-Capillary: 120 mg/dL — ABNORMAL HIGH (ref 70–99)
Glucose-Capillary: 185 mg/dL — ABNORMAL HIGH (ref 70–99)

## 2023-03-06 MED ORDER — LORATADINE 10 MG PO TABS
10.0000 mg | ORAL_TABLET | Freq: Every day | ORAL | Status: DC
  Administered 2023-03-06 – 2023-03-07 (×2): 10 mg via ORAL
  Filled 2023-03-06 (×2): qty 1

## 2023-03-06 MED ORDER — PREDNISONE 20 MG PO TABS
40.0000 mg | ORAL_TABLET | Freq: Every day | ORAL | Status: DC
  Administered 2023-03-07: 40 mg via ORAL
  Filled 2023-03-06: qty 2

## 2023-03-06 MED ORDER — ORAL CARE MOUTH RINSE: 15.0000 mL | OROMUCOSAL | Status: DC | PRN

## 2023-03-06 MED ORDER — IPRATROPIUM-ALBUTEROL 0.5-2.5 (3) MG/3ML IN SOLN
3.0000 mL | Freq: Two times a day (BID) | RESPIRATORY_TRACT | Status: DC
  Administered 2023-03-06 – 2023-03-07 (×2): 3 mL via RESPIRATORY_TRACT
  Filled 2023-03-06 (×2): qty 3

## 2023-03-06 MED ORDER — GABAPENTIN 300 MG PO CAPS
300.0000 mg | ORAL_CAPSULE | Freq: Three times a day (TID) | ORAL | Status: DC
  Administered 2023-03-06 – 2023-03-07 (×4): 300 mg via ORAL
  Filled 2023-03-06 (×4): qty 1

## 2023-03-06 NOTE — Plan of Care (Signed)
  Problem: Education: Goal: Knowledge of General Education information will improve Description: Including pain rating scale, medication(s)/side effects and non-pharmacologic comfort measures Outcome: Adequate for Discharge   Problem: Health Behavior/Discharge Planning: Goal: Ability to manage health-related needs will improve Outcome: Adequate for Discharge   Problem: Clinical Measurements: Goal: Ability to maintain clinical measurements within normal limits will improve Outcome: Adequate for Discharge Goal: Will remain free from infection Outcome: Adequate for Discharge Goal: Respiratory complications will improve Outcome: Adequate for Discharge Goal: Cardiovascular complication will be avoided Outcome: Adequate for Discharge   Problem: Activity: Goal: Risk for activity intolerance will decrease Outcome: Adequate for Discharge   Problem: Coping: Goal: Level of anxiety will decrease Outcome: Adequate for Discharge

## 2023-03-06 NOTE — Progress Notes (Signed)
[facility] Progress Note  Patient: Kathleen Rodriguez    ZOX:[id number]  DOA: 03/03/2023    Date of Service: the patient was seen and examined on 03/06/2023  Brief hospital course: 57 year old female with past medical history of COPD (not on oxygen), diabetes mellitus type 2, obesity and previous drug use who presented to the emergency room on 5/10 with complaints of shortness of breath and wheezing x 3 days.  Patient noted to be hypoxic initially requiring as much as 7 L by nonrebreather mask to keep oxygen saturations above 90%.  She was given nebulizers and steroids.  Chest x-ray and BNP were unremarkable.  Patient was brought into the hospitalist service for further evaluation.   Assessment and Plan: COPD exacerbation: Improving, able to be weaned down to room air.  Wheezing improved although still gets dyspneic with talking.  Continue nebulizers and will change steroids to p.o.  Will have her follow-up soon as outpatient with pulmonary.  Given normal procalcitonin, no need for antibiotics  Diabetes mellitus: Monitor CBGs especially while on Solu-Medrol.  Stable  Obesity: Meets criteria with BMI greater than 30  Hypertensive urgency: Elevated blood pressure on admission, in part due to hypoxia.  Blood pressures continue to improve.  Continue home medications.  Yeast infection: Secondary to steroids.  1 dose of Diflucan given  Body mass index is 33.78 kg/m.        Consultants: None  Procedures: None  Antimicrobials: None  Code Status: Full code   Subjective: Breathing somewhat easier.  Objective: Vital signs were reviewed and unremarkable. Vitals:   03/06/23 0535 03/06/23 0715  BP: 132/85   Pulse: 67   Resp:    Temp: 97.8 F (36.6 C)   SpO2: 98% 95%    Intake/Output Summary (Last 24 hours) at 03/06/2023 1036 Last data filed at 03/05/2023 2200 Gross per 24 hour  Intake 880 ml  Output 0 ml  Net 880 ml    Filed Weights   03/03/23 1254  Weight: 92.1 kg    Body mass index is 33.78 kg/m.  Exam:  General: Alert and oriented x 3, no acute distress HEENT: Normocephalic, atraumatic, mucous membranes are moist Cardiovascular: Regular rate and rhythm, S1-S2 Respiratory: Minimal end expiratory wheezing, improved from previous day Abdomen: Soft, nontender, nondistended, positive bowel sounds Musculoskeletal: No clubbing or cyanosis or edema Skin: No skin breaks, tears or lesions Psychiatry: Appropriate, no evidence of psychoses Neurology: No focal deficits  Data Reviewed: Reviewed CBGs  Disposition:  Status is: Inpatient    Anticipated discharge date: 5/14  Remaining issues to be resolved so that patient can be discharged:  -Improvement in dyspnea and wheezing   Family Communication: Declined for me to call anyone DVT Prophylaxis: enoxaparin (LOVENOX) injection 40 mg Start: 03/03/23 2200 SCDs Start: 03/03/23 1618    Author: Hollice Espy ,MD 03/06/2023 10:36 AM  To reach On-call, see care teams to locate the attending and reach out via [email or web address]. Between 7PM-7AM, please contact night-coverage If you still have difficulty reaching the attending provider, please page the [facility] (Director on Call) for [facility] on amion for assistance.

## 2023-03-06 NOTE — TOC Initial Note (Addendum)
Transition of Care [facility]) - Initial/Assessment Note    Patient Details  Name: Kathleen Rodriguez MRN: [id number] Date of Birth: May 30, 1966  Transition [facility]) CM/SW Contact:    Lanier Clam, RN Phone Number: 03/06/2023, 2:19 PM  Clinical Narrative:  Referral for asst w/housing. Spoke to patient about resources available-informed patient if no home to go to resource to [employer]);[employer]-patient states she does not want to go to the [facility]. States she has a home to go to for now. States she needs transportation & asst w/meds-informed her & also wrote down the insurance, policy #,tel# for member services for her to contact. Patient has a managed medicaid plan-she has access to a case worker,transporation, meds, they can asst w/resources also for housing. TOC will provide her the resource to shelters, dept of social services. Patient is ambulatory therefore she can receive a bus pass. She is in need of shoes will contact chaplain for shoes. Continue to assess for d/c plans.                 Expected Discharge Plan: Homeless Shelter Barriers to Discharge: Continued Medical Work up   Patient Goals and CMS Choice Patient states their goals for this hospitalization and ongoing recovery are:: Shelter [employer].gov Compare Post Acute Care list provided to:: Patient Choice offered to / list presented to : Patient [place] ownership interest in [facility].provided to:: Patient    Expected Discharge Plan and Services   Discharge Planning Services: CM Consult   Living arrangements for the past 2 months: Homeless Shelter                                      Prior Living Arrangements/Services Living arrangements for the past 2 months: Homeless Shelter Lives with:: Self Patient language and need for interpreter reviewed:: Yes Do you feel safe going back to the place where you live?: Yes      Need for Family Participation in Patient Care: Yes  (Comment) Care giver support system in place?: Yes (comment)   Criminal Activity/Legal Involvement Pertinent to Current Situation/Hospitalization: No - Comment as needed  Activities of Daily Living Home Assistive Devices/Equipment: Walker (specify type) ADL Screening (condition at time of admission) Patient's cognitive ability adequate to safely complete daily activities?: Yes Is the patient deaf or have difficulty hearing?: No Does the patient have difficulty seeing, even when wearing glasses/contacts?: No Does the patient have difficulty concentrating, remembering, or making decisions?: No Patient able to express need for assistance with ADLs?: Yes Does the patient have difficulty dressing or bathing?: No Independently performs ADLs?: Yes (appropriate for developmental age) Does the patient have difficulty walking or climbing stairs?: No Weakness of Legs: Both Weakness of Arms/Hands: None  Permission Sought/Granted Permission sought to share information with : Case Manager Permission granted to share information with : Yes, Verbal Permission Granted  Share Information with NAME: Case manager           Emotional Assessment Appearance:: Appears stated age Attitude/Demeanor/Rapport: Gracious Affect (typically observed): Accepting Orientation: : Oriented to Place, Oriented to  Time, Oriented to Situation Alcohol / Substance Use: Not Applicable Psych Involvement: No (comment)  Admission diagnosis:  Acute asthma exacerbation [J45.901] Exacerbation of asthma, unspecified asthma severity, unspecified whether persistent [J45.901] COPD exacerbation (HCC) [J44.1] Patient Active Problem List   Diagnosis Date Noted   COPD (chronic obstructive pulmonary disease) (HCC) 01/31/2023  COPD exacerbation (HCC) 12/31/2022   COPD with acute exacerbation (HCC) 08/27/2022   Grief at loss of child 08/27/2022   Itching in the vaginal area 08/27/2022   Glucose intolerance 08/27/2022   Seasonal  allergic rhinitis 02/10/2022   Acute respiratory failure with hypoxia (HCC) 01/16/2022   Anxiety 10/17/2021   Asthma with COPD 10/16/2021   Hypocalcemia 10/16/2021   Left shoulder pain 10/16/2021   Severe persistent asthma with acute exacerbation 09/25/2021   Diabetes mellitus without complication (HCC) 01/20/2021   OSA (obstructive sleep apnea) 01/20/2021   Essential hypertension 01/20/2021   Obesity (BMI 30-39.9) 01/20/2021   Acute asthma exacerbation 10/09/2019   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   Asthma 10/22/2017   Hypokalemia 07/10/2017   Osteoarthritis of left knee 09/01/2016   Total knee replacement status 09/01/2016   Osteoarthritis of knees, 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   Tobacco abuse    Primary osteoarthritis of left knee 03/09/2016   Anemia, iron deficiency 08/12/2015   Dysfunctional uterine bleeding 12/28/2014   Morbid obesity (HCC) 05/16/2014   Upper airway cough syndrome 05/01/2014   Vaginal candida 04/09/2014   GERD (gastroesophageal reflux disease) 04/08/2014   OSA (obstructive sleep apnea) 03/20/2013   PCP:  Hoy Register, MD Pharmacy:   [id number] - [place], [place] - [address] AT [facility] [address] [place] [address] Phone: [phone] Fax: [phone]     Social Determinants of Health (SDOH) Social History: SDOH Screenings   Food Insecurity: No Food Insecurity (03/04/2023)  Housing: Low Risk  (03/04/2023)  Transportation Needs: No Transportation Needs (03/04/2023)  Utilities: Not At Risk (03/04/2023)  Alcohol Screen: Low Risk  (07/28/2022)  Depression (PHQ2-9): Low Risk  (12/10/2021)  Financial Resource Strain: High Risk (08/31/2022)  Physical Activity: Inactive (03/14/2022)  Social Connections: Moderately Isolated (03/14/2022)  Stress: Stress Concern Present (08/31/2022)  Tobacco Use: Medium Risk (03/03/2023)   SDOH Interventions:     Readmission Risk Interventions     No data to display

## 2023-03-06 NOTE — Progress Notes (Signed)
Spoke with [facility] rep and they can provide a ride home tomorrow.  Can do same day call once dc time known - provider line is [phone]. Kathleen Rodriguez

## 2023-03-07 ENCOUNTER — Other Ambulatory Visit (HOSPITAL_COMMUNITY): Payer: Self-pay

## 2023-03-07 ENCOUNTER — Other Ambulatory Visit: Payer: Self-pay

## 2023-03-07 ENCOUNTER — Ambulatory Visit: Payer: Medicaid Other | Admitting: Family Medicine

## 2023-03-07 DIAGNOSIS — E669 Obesity, unspecified: Secondary | ICD-10-CM | POA: Diagnosis not present

## 2023-03-07 DIAGNOSIS — J9601 Acute respiratory failure with hypoxia: Secondary | ICD-10-CM | POA: Diagnosis not present

## 2023-03-07 DIAGNOSIS — G4733 Obstructive sleep apnea (adult) (pediatric): Secondary | ICD-10-CM | POA: Diagnosis not present

## 2023-03-07 DIAGNOSIS — J441 Chronic obstructive pulmonary disease with (acute) exacerbation: Secondary | ICD-10-CM | POA: Diagnosis not present

## 2023-03-07 LAB — GLUCOSE, CAPILLARY
Glucose-Capillary: 71 mg/dL (ref 70–99)
Glucose-Capillary: 97 mg/dL (ref 70–99)

## 2023-03-07 MED ORDER — METFORMIN HCL 500 MG PO TABS: 500.0000 mg | ORAL_TABLET | Freq: Two times a day (BID) | ORAL | Status: DC

## 2023-03-07 MED ORDER — ALBUTEROL SULFATE HFA 108 (90 BASE) MCG/ACT IN AERS
2.0000 | INHALATION_SPRAY | Freq: Four times a day (QID) | RESPIRATORY_TRACT | 3 refills | Status: AC | PRN
Start: 2023-03-07 — End: ?
  Filled 2023-03-07: qty 18, 17d supply, fill #0

## 2023-03-07 MED ORDER — LORATADINE 10 MG PO TABS
10.0000 mg | ORAL_TABLET | Freq: Every day | ORAL | 2 refills | Status: AC
  Filled 2023-03-07 (×2): qty 30, 30d supply, fill #0

## 2023-03-07 MED ORDER — MOMETASONE FURO-FORMOTEROL FUM 200-5 MCG/ACT IN AERO
2.0000 | INHALATION_SPRAY | Freq: Two times a day (BID) | RESPIRATORY_TRACT | 3 refills | Status: AC
  Filled 2023-03-07: qty 13, 30d supply, fill #0

## 2023-03-07 MED ORDER — BREZTRI AEROSPHERE 160-9-4.8 MCG/ACT IN AERO
2.0000 | INHALATION_SPRAY | Freq: Two times a day (BID) | RESPIRATORY_TRACT | 5 refills | Status: DC
Start: 2023-03-07 — End: 2023-03-07
  Filled 2023-03-07: qty 10.7, 30d supply, fill #0

## 2023-03-07 MED ORDER — MONTELUKAST SODIUM 10 MG PO TABS
10.0000 mg | ORAL_TABLET | Freq: Every day | ORAL | 2 refills | Status: AC
Start: 2023-03-07 — End: 2023-06-05
  Filled 2023-03-07: qty 30, 30d supply, fill #0

## 2023-03-07 MED ORDER — METFORMIN HCL 500 MG PO TABS
500.0000 mg | ORAL_TABLET | Freq: Two times a day (BID) | ORAL | 2 refills | Status: AC
  Filled 2023-03-07: qty 60, 30d supply, fill #0

## 2023-03-07 MED ORDER — FLUCONAZOLE 150 MG PO TABS
150.0000 mg | ORAL_TABLET | Freq: Every day | ORAL | 0 refills | Status: AC
  Filled 2023-03-07: qty 1, 1d supply, fill #0

## 2023-03-07 MED ORDER — PREDNISONE 10 MG PO TABS
ORAL_TABLET | ORAL | 0 refills | Status: AC
  Filled 2023-03-07: qty 10, 4d supply, fill #0

## 2023-03-07 NOTE — Discharge Summary (Addendum)
Physician Discharge Summary   Patient: Kathleen Rodriguez MRN: [id number] DOB: Nov 20, 1965  Admit date:     03/03/2023  Discharge date: 03/07/23  Discharge Physician: Hollice Espy   PCP: Hoy Register, MD   Recommendations at discharge:   New medication: Prednisone taper New medication: Diflucan 100 mg p.o. to take once after prednisone taper complete if still with yeast infection New medication: Dulera inhaler 2 puffs twice daily  Medication change: Bretztri inhaler discontinued.  Not covered by patient's insurance. Patient with new prescriptions for existing medications including albuterol inhaler, Bretztri inhaler, Claritin and Singulair and metformin.  Medication sent over to [facility] outpatient pharmacy as patient states she is not able to afford her medications. Medication change: Neurontin discontinued, patient was not taking this medication Patient will follow-up with her pulmonologist Dr. Craige Cotta, in the next 2 weeks  Discharge Diagnoses: Principal Problem:   COPD with acute exacerbation (HCC) Active Problems:   GERD (gastroesophageal reflux disease)   Acute asthma exacerbation   OSA (obstructive sleep apnea)   Obesity (BMI 30-39.9)   Acute respiratory failure with hypoxia (HCC)   COPD exacerbation (HCC)  Resolved Problems:   * No resolved hospital problems. *  Hospital Course: 57 year old female with past medical history of COPD (not on oxygen), diabetes mellitus type 2, obesity and previous drug use who presented to the emergency room on 5/10 with complaints of shortness of breath and wheezing x 3 days.  Patient noted to be hypoxic initially requiring as much as 7 L by nonrebreather mask to keep oxygen saturations above 90%.  She was given nebulizers and steroids.  Chest x-ray and BNP were unremarkable.  Patient was brought into the hospitalist service for further evaluation.  Assessment and Plan: COPD exacerbation: Improving, able to be weaned down to room air.   Currently at 99%.  Wheezing resolved.  Will give new prescriptions for her baseline and as needed inhalers.  Discharged on prednisone taper.  Will have her follow-up soon as outpatient with pulmonary next 2 weeks.  Given normal procalcitonin, no need for antibiotics   Diabetes mellitus: Monitor CBGs especially while on Solu-Medrol.  Stable.  Patient states she is normally on metformin 500 twice daily, although this was not listed in her med rec.  Have given a prescription for this as well.   Obesity: Meets criteria with BMI greater than 30   Hypertensive urgency: Elevated blood pressure on admission, in part due to hypoxia.  Blood pressures continue to improve.  Continue home medications.   Yeast infection: Secondary to steroids.  1 dose of Diflucan given          Consultants: None Procedures performed: None Disposition: Home Diet recommendation:  Discharge Diet Orders (From admission, onward)     Start     Ordered   03/07/23 0000  Diet - low sodium heart healthy        03/07/23 1036           Carb modified diet DISCHARGE MEDICATION: Allergies as of 03/07/2023       Reactions   Tomato Hives, Itching, Other (See Comments)   ALSO REACTS TO KETCHUP   Latex Itching, Rash   Wool Alcohol [lanolin] Itching        Medication List     STOP taking these medications    BENZONATATE PO   Breztri Aerosphere 160-9-4.8 MCG/ACT Aero Generic drug: Budeson-Glycopyrrol-Formoterol   gabapentin 300 MG capsule Commonly known as: NEURONTIN   methocarbamol 500 MG tablet Commonly known  as: ROBAXIN       TAKE these medications    Accu-Chek Guide Me w/Device Kit Use to check blood sugar once daily.   Accu-Chek Guide test strip Generic drug: glucose blood Use as instructed daily before breakfast.   albuterol 108 (90 Base) MCG/ACT inhaler Commonly known as: ProAir HFA Inhale 2 puffs into the lungs 4 (four) times daily as needed for wheezing or shortness of breath. What  changed:  when to take this reasons to take this   aspirin EC 81 MG tablet Take 1 tablet (81 mg total) by mouth daily.   cloNIDine 0.2 MG tablet Commonly known as: CATAPRES Take 1 tablet (0.2 mg total) by mouth 2 (two) times daily. For BP and hot flashes   escitalopram 10 MG tablet Commonly known as: LEXAPRO Take 1 tablet (10 mg total) by mouth daily.   fluconazole 150 MG tablet Commonly known as: DIFLUCAN Take 1 tablet (150 mg total) by mouth daily. Take after prednisone completed if yeast infection persists Start taking on: Mar 08, 2023   fluticasone 50 MCG/ACT nasal spray Commonly known as: FLONASE Place 1 spray into both nostrils daily as needed for allergies. What changed:  how much to take when to take this   hydrOXYzine 10 MG tablet Commonly known as: ATARAX Take 1 tablet (10 mg total) by mouth 3 (three) times daily as needed for anxiety or itching.   ipratropium-albuterol 0.5-2.5 (3) MG/3ML Soln Commonly known as: DUONEB Take 3 mLs by nebulization every 6 (six) hours as needed (shortness of breath). What changed:  when to take this reasons to take this   loratadine 10 MG tablet Commonly known as: CLARITIN Take 1 tablet (10 mg total) by mouth daily.   metFORMIN 500 MG tablet Commonly known as: GLUCOPHAGE Take 1 tablet (500 mg total) by mouth 2 (two) times daily with a meal.   mometasone-formoterol 100-5 MCG/ACT Aero Commonly known as: DULERA Inhale 2 puffs into the lungs in the morning and at bedtime.   montelukast 10 MG tablet Commonly known as: SINGULAIR Take 1 tablet (10 mg total) by mouth at bedtime.   pantoprazole 40 MG tablet Commonly known as: PROTONIX Take 1 tablet (40 mg total) by mouth daily.   predniSONE 10 MG tablet Commonly known as: DELTASONE Take 4 tablets by mouth daily with breakfast for 1 day, THEN 3 tabs daily with breakfast for 1 day, THEN 2 tabs daily with breakfast for 1 day, THEN 1 tablet daily with breakfast for 1 day. Start  taking on: Mar 08, 2023 What changed: See the new instructions.        Follow-up Information     [name], MD. Schedule an appointment as soon as possible for a visit in 2 week(s).   Specialties: Pulmonary Disease, Sleep Medicine Contact information: [address] [place] [address] [phone]                Discharge Exam: Ceasar Mons Weights   03/03/23 1254  Weight: 92.1 kg   General: Alert and oriented x 3, no acute distress Lungs: Clear to auscultation bilaterally  Condition at discharge: good  The results of significant diagnostics from this hospitalization (including imaging, microbiology, ancillary and laboratory) are listed below for reference.   Imaging Studies: DG Chest 2 View  Result Date: 03/03/2023 CLINICAL DATA:  Shortness of breath and asthma EXAM: CHEST - 2 VIEW COMPARISON:  X-ray 01/31/2023 and older FINDINGS: No consolidation, pneumothorax or effusion. No edema. Normal cardiopericardial  silhouette. Overlapping cardiac leads. IMPRESSION: No acute cardiopulmonary disease Electronically Signed   By: Karen Kays M.D.   On: 03/03/2023 14:13    Microbiology: Results for orders placed or performed during the hospital encounter of 01/31/23  Resp panel by RT-PCR (RSV, Flu A&B, Covid) Anterior Nasal Swab     Status: None   Collection Time: 01/31/23  4:00 PM   Specimen: Anterior Nasal Swab  Result Value Ref Range Status   SARS Coronavirus 2 by RT PCR NEGATIVE NEGATIVE Final   Influenza A by PCR NEGATIVE NEGATIVE Final   Influenza B by PCR NEGATIVE NEGATIVE Final    Comment: (NOTE) The Xpert Xpress SARS-CoV-2/FLU/RSV plus assay is intended as an aid in the diagnosis of influenza from Nasopharyngeal swab specimens and should not be used as a sole basis for treatment. Nasal washings and aspirates are unacceptable for Xpert Xpress SARS-CoV-2/FLU/RSV testing.  Fact Sheet for Patients: BloggerCourse.com  Fact Sheet  for Healthcare Providers: SeriousBroker.it  This test is not yet approved or cleared by the [place] FDA and has been authorized for detection and/or diagnosis of SARS-CoV-2 by FDA under an Emergency Use Authorization (EUA). This EUA will remain in effect (meaning this test can be used) for the duration of the COVID-19 declaration under Section 564(b)(1) of the Act, 21 U.S.C. section 360bbb-3(b)(1), unless the authorization is terminated or revoked.     Resp Syncytial Virus by PCR NEGATIVE NEGATIVE Final    Comment: (NOTE) Fact Sheet for Patients: BloggerCourse.com  Fact Sheet for Healthcare Providers: SeriousBroker.it  This test is not yet approved or cleared by the [place] FDA and has been authorized for detection and/or diagnosis of SARS-CoV-2 by FDA under an Emergency Use Authorization (EUA). This EUA will remain in effect (meaning this test can be used) for the duration of the COVID-19 declaration under Section 564(b)(1) of the Act, 21 U.S.C. section 360bbb-3(b)(1), unless the authorization is terminated or revoked.  Performed at [facility], [address] [place], [place] [address]     Labs: CBC: Recent Labs  Lab 03/03/23 1303 03/03/23 1549 03/04/23 0456  WBC 7.4  --  7.1  NEUTROABS 4.2  --   --   HGB 13.0 14.3 12.9  HCT 41.3 42.0 40.4  MCV 86.6  --  86.3  PLT 286  --  284   Basic Metabolic Panel: Recent Labs  Lab 03/03/23 1527 03/03/23 1549 03/04/23 0456  NA 140 141 138  K 4.6 4.7 4.6  CL 106 106 105  CO2 26  --  24  GLUCOSE 121* 123* 96  BUN 10 10 14   CREATININE 0.76 0.50 0.66  CALCIUM 8.2*  --  8.6*   Liver Function Tests: No results for input(s): "AST", "ALT", "ALKPHOS", "BILITOT", "PROT", "ALBUMIN" in the last 168 hours. CBG: Recent Labs  Lab 03/06/23 0732 03/06/23 1140 03/06/23 1658 03/06/23 2102 03/07/23 0720  GLUCAP 94 112* 120* 185* 71     Discharge time spent: less than 30 minutes.  Signed: Hollice Espy, MD [facility] 03/07/2023

## 2023-03-07 NOTE — TOC Transition Note (Signed)
Transition of Care [facility]) - CM/SW Discharge Note   Patient Details  Name: Kathleen Rodriguez MRN: [id number] Date of Birth: 03/15/1966  Transition of [facility]) CM/SW Contact:  Lanier Clam, RN Phone Number: 03/07/2023, 10:53 AM   Clinical Narrative:  Patient has transport set up.From yesterday provided patient w/all resources for safe d/c. Has an obligated co pay w/managed [employer] plan.Provided yesterday w/member tel# to contact her case worker for ongoing med asst concerns. No further CM needs.    Final next level of care: Homeless Shelter Barriers to Discharge: No Barriers Identified   Patient Goals and CMS Choice CMS Medicare.gov Compare Post Acute Care list provided to:: Patient Choice offered to / list presented to : Patient  Discharge Placement                         Discharge Plan and Services Additional resources added to the After Visit Summary for     Discharge Planning Services: CM Consult                                 Social Determinants of Health (SDOH) Interventions SDOH Screenings   Food Insecurity: No Food Insecurity (03/04/2023)  Housing: Low Risk  (03/04/2023)  Transportation Needs: No Transportation Needs (03/04/2023)  Utilities: Not At Risk (03/04/2023)  Alcohol Screen: Low Risk  (07/28/2022)  Depression (PHQ2-9): Low Risk  (12/10/2021)  Financial Resource Strain: High Risk (08/31/2022)  Physical Activity: Inactive (03/14/2022)  Social Connections: Moderately Isolated (03/14/2022)  Stress: Stress Concern Present (08/31/2022)  Tobacco Use: Medium Risk (03/03/2023)     Readmission Risk Interventions    03/06/2023    2:38 PM  Readmission Risk Prevention Plan  Transportation Screening Complete  PCP or Specialist Appt within 3-5 Days Complete  HRI or Home Care Consult Complete  Social Work Consult for Recovery Care Planning/Counseling Complete  Palliative Care Screening Not Applicable  Medication Review Ocean[employer])  Complete

## 2023-03-08 ENCOUNTER — Telehealth: Payer: Self-pay

## 2023-03-08 NOTE — Telephone Encounter (Signed)
..   Medicaid Managed Care   Unsuccessful Outreach Note  03/08/2023 Name: Kathleen Rodriguez MRN: [id number] DOB: 11-Oct-1966  Referred by: Hoy Register, MD Reason for referral : Appointment (I tried to reach the patient today to get her rescheduled with the MM RNCM, Her phone number was not in order.)   An unsuccessful telephone outreach was attempted today. The patient was referred to the case management team for assistance with care management and care coordination.   Follow Up Plan: The care management team will reach out to the patient again over the next 7 days.   Weston Settle Care Guide  [facility] Managed  Care Guide [facility]  [phone]

## 2023-03-08 NOTE — Transitions of Care (Post Inpatient/ED Visit) (Signed)
   03/08/2023  Name: Kathleen Rodriguez MRN: [id number] DOB: [birth year]  Today's TOC FU Call Status: Today's TOC FU Call Status:: Unsuccessul Call (1st Attempt) Unsuccessful Call (1st Attempt) Date: 03/08/23  Attempted to reach the patient regarding the most recent Inpatient/ED visit.  Follow Up Plan: Additional outreach attempts will be made to reach the patient to complete the Transitions of Care (Post Inpatient/ED visit) call.   Signature  Robyne Peers, RN

## 2023-03-09 ENCOUNTER — Telehealth: Payer: Self-pay

## 2023-03-09 NOTE — Transitions of Care (Post Inpatient/ED Visit) (Signed)
   03/09/2023  Name: Kathleen Rodriguez MRN: [id number] DOB: 1966-03-18  Today's TOC FU Call Status: Today's TOC FU Call Status:: Unsuccessful Call (2nd Attempt) Unsuccessful Call (1st Attempt) Date: 03/08/23 Unsuccessful Call (2nd Attempt) Date: 03/09/23  Attempted to reach the patient regarding the most recent Inpatient/ED visit.  Follow Up Plan: Additional outreach attempts will be made to reach the patient to complete the Transitions of Care (Post Inpatient/ED visit) call.   Signature  Robyne Peers, RN

## 2023-03-11 ENCOUNTER — Other Ambulatory Visit (HOSPITAL_COMMUNITY): Payer: Self-pay

## 2023-03-13 ENCOUNTER — Telehealth: Payer: Self-pay

## 2023-03-13 NOTE — Transitions of Care (Post Inpatient/ED Visit) (Signed)
   03/13/2023  Name: Kathleen Rodriguez MRN: [id number] DOB: 1966-02-09  Today's TOC FU Call Status: Today's TOC FU Call Status:: Unsuccessful Call (3rd Attempt) Unsuccessful Call (1st Attempt) Date: 03/08/23 Unsuccessful Call (2nd Attempt) Date: 03/09/23 Unsuccessful Call (3rd Attempt) Date: 03/13/23  Attempted to reach the patient regarding the most recent Inpatient/ED visit.  Follow Up Plan: No further outreach attempts will be made at this time. We have been unable to contact the patient.  A letter was sent to patient via MyChart requesting she contact [facility] to schedule a follow up appointment as we have not been able to reach her.  Signature  Robyne Peers, RN

## 2023-03-14 ENCOUNTER — Telehealth: Payer: Self-pay

## 2023-03-14 NOTE — Telephone Encounter (Signed)
..   Medicaid Managed Care   Unsuccessful Outreach Note  03/14/2023 Name: Kathleen Rodriguez MRN: [id number] DOB: 12/09/1965  Referred by: Hoy Register, MD Reason for referral : Appointment   A second unsuccessful telephone outreach was attempted today. The patient was referred to the case management team for assistance with care management and care coordination.   Follow Up Plan: The care management team will reach out to the patient again over the next 7 days.   Weston Settle Care Guide  [facility] Managed  Care Guide [facility]  [phone]

## 2023-03-15 ENCOUNTER — Other Ambulatory Visit (HOSPITAL_COMMUNITY): Payer: Self-pay

## 2023-03-24 ENCOUNTER — Telehealth: Payer: Self-pay

## 2023-03-24 NOTE — Telephone Encounter (Signed)
..   Medicaid Managed Care   Unsuccessful Outreach Note  03/24/2023 Name: Kathleen Rodriguez MRN: [id number] DOB: [birth year]  Referred by: Hoy Register, MD Reason for referral : Appointment (I made a third attempt to reach Ms.Hattery but her number was not in order.)   Third unsuccessful telephone outreach was attempted today. The patient was referred to the case management team for assistance with care management and care coordination. The patient's primary care provider has been notified of our unsuccessful attempts to make or maintain contact with the patient. The care management team is pleased to engage with this patient at any time in the future should he/she be interested in assistance from the care management team.   Follow Up Plan: We have been unable to make contact with the patient for follow up. The care management team is available to follow up with the patient after provider conversation with the patient regarding recommendation for care management engagement and subsequent re-referral to the care management team.   Weston Settle Care Guide  [facility] Managed  Care Guide [facility]  [phone]

## 2023-06-15 DIAGNOSIS — T24232A Burn of second degree of left lower leg, initial encounter: Secondary | ICD-10-CM | POA: Diagnosis not present

## 2023-06-15 DIAGNOSIS — T310 Burns involving less than 10% of body surface: Secondary | ICD-10-CM | POA: Diagnosis not present

## 2023-06-15 DIAGNOSIS — T24231A Burn of second degree of right lower leg, initial encounter: Secondary | ICD-10-CM | POA: Diagnosis not present

## 2023-07-14 ENCOUNTER — Telehealth: Payer: Self-pay | Admitting: Pulmonary Disease

## 2023-07-14 NOTE — Telephone Encounter (Signed)
Pt states she was homeless and was unable to make appts that were sch for her but pt is now requesting a albuterol be sent in LOV  01/24/2022 [phone]

## 2023-07-20 NOTE — Telephone Encounter (Signed)
Attempted to call vmail full

## 2023-07-26 NOTE — Telephone Encounter (Signed)
ATC patient. VM is full and cannot receive messages.  Mobile number in EPIC rings with no answer.  MyChart was last used 2020.

## 2023-08-06 DIAGNOSIS — K047 Periapical abscess without sinus: Secondary | ICD-10-CM | POA: Diagnosis not present

## 2023-08-15 DIAGNOSIS — R0789 Other chest pain: Secondary | ICD-10-CM | POA: Diagnosis not present

## 2023-08-15 DIAGNOSIS — J45901 Unspecified asthma with (acute) exacerbation: Secondary | ICD-10-CM | POA: Diagnosis not present

## 2023-08-15 DIAGNOSIS — J9601 Acute respiratory failure with hypoxia: Secondary | ICD-10-CM | POA: Diagnosis not present

## 2023-08-15 DIAGNOSIS — R0902 Hypoxemia: Secondary | ICD-10-CM | POA: Diagnosis not present

## 2023-08-15 DIAGNOSIS — E119 Type 2 diabetes mellitus without complications: Secondary | ICD-10-CM | POA: Diagnosis not present

## 2023-08-15 DIAGNOSIS — I959 Hypotension, unspecified: Secondary | ICD-10-CM | POA: Diagnosis not present

## 2023-08-15 DIAGNOSIS — R0689 Other abnormalities of breathing: Secondary | ICD-10-CM | POA: Diagnosis not present

## 2023-08-15 DIAGNOSIS — R0602 Shortness of breath: Secondary | ICD-10-CM | POA: Diagnosis not present

## 2023-08-15 DIAGNOSIS — R062 Wheezing: Secondary | ICD-10-CM | POA: Diagnosis not present

## 2023-08-16 DIAGNOSIS — J45901 Unspecified asthma with (acute) exacerbation: Secondary | ICD-10-CM | POA: Diagnosis not present

## 2023-08-16 DIAGNOSIS — I10 Essential (primary) hypertension: Secondary | ICD-10-CM | POA: Diagnosis not present

## 2023-08-16 DIAGNOSIS — E119 Type 2 diabetes mellitus without complications: Secondary | ICD-10-CM | POA: Diagnosis not present

## 2023-08-17 DIAGNOSIS — J45901 Unspecified asthma with (acute) exacerbation: Secondary | ICD-10-CM | POA: Diagnosis not present

## 2023-08-17 DIAGNOSIS — E119 Type 2 diabetes mellitus without complications: Secondary | ICD-10-CM | POA: Diagnosis not present

## 2023-08-17 DIAGNOSIS — I10 Essential (primary) hypertension: Secondary | ICD-10-CM | POA: Diagnosis not present

## 2023-08-28 ENCOUNTER — Telehealth: Payer: Self-pay | Admitting: *Deleted

## 2023-08-28 NOTE — Telephone Encounter (Signed)
ATC patient x3.  Reached a recording each time that my call could not be completed as dialed.

## 2023-08-29 ENCOUNTER — Ambulatory Visit: Payer: Medicaid Other | Admitting: Adult Health

## 2023-08-31 ENCOUNTER — Encounter: Payer: Self-pay | Admitting: Adult Health

## 2023-09-01 DIAGNOSIS — R058 Other specified cough: Secondary | ICD-10-CM | POA: Diagnosis not present

## 2023-09-01 DIAGNOSIS — J189 Pneumonia, unspecified organism: Secondary | ICD-10-CM | POA: Diagnosis not present

## 2023-09-01 DIAGNOSIS — J441 Chronic obstructive pulmonary disease with (acute) exacerbation: Secondary | ICD-10-CM | POA: Diagnosis not present

## 2023-09-01 DIAGNOSIS — J440 Chronic obstructive pulmonary disease with acute lower respiratory infection: Secondary | ICD-10-CM | POA: Diagnosis not present

## 2023-09-01 DIAGNOSIS — J9601 Acute respiratory failure with hypoxia: Secondary | ICD-10-CM | POA: Diagnosis not present

## 2023-09-02 DIAGNOSIS — J9601 Acute respiratory failure with hypoxia: Secondary | ICD-10-CM | POA: Diagnosis not present

## 2023-09-02 DIAGNOSIS — J188 Other pneumonia, unspecified organism: Secondary | ICD-10-CM | POA: Diagnosis not present

## 2023-09-02 DIAGNOSIS — R000 Tachycardia, unspecified: Secondary | ICD-10-CM | POA: Diagnosis not present

## 2023-09-02 DIAGNOSIS — I517 Cardiomegaly: Secondary | ICD-10-CM | POA: Diagnosis not present

## 2023-09-02 DIAGNOSIS — J441 Chronic obstructive pulmonary disease with (acute) exacerbation: Secondary | ICD-10-CM | POA: Diagnosis not present

## 2023-09-02 DIAGNOSIS — J440 Chronic obstructive pulmonary disease with acute lower respiratory infection: Secondary | ICD-10-CM | POA: Diagnosis not present

## 2023-09-02 DIAGNOSIS — J189 Pneumonia, unspecified organism: Secondary | ICD-10-CM | POA: Diagnosis not present

## 2023-09-03 DIAGNOSIS — J9601 Acute respiratory failure with hypoxia: Secondary | ICD-10-CM | POA: Diagnosis not present

## 2023-09-03 DIAGNOSIS — J441 Chronic obstructive pulmonary disease with (acute) exacerbation: Secondary | ICD-10-CM | POA: Diagnosis not present

## 2023-09-03 DIAGNOSIS — J189 Pneumonia, unspecified organism: Secondary | ICD-10-CM | POA: Diagnosis not present

## 2023-09-03 DIAGNOSIS — J440 Chronic obstructive pulmonary disease with acute lower respiratory infection: Secondary | ICD-10-CM | POA: Diagnosis not present

## 2023-09-04 ENCOUNTER — Telehealth: Payer: Self-pay

## 2023-09-04 NOTE — Transitions of Care (Post Inpatient/ED Visit) (Signed)
   09/04/2023  Name: Kathleen Rodriguez MRN: [id number] DOB: May 17, 1966  Today's TOC FU Call Status: Today's TOC FU Call Status:: Unsuccessful Call (1st Attempt) Unsuccessful Call (1st Attempt) Date: 09/04/23  Attempted to reach the patient regarding the most recent Inpatient/ED visit.  Follow Up Plan: Additional outreach attempts will be made to reach the patient to complete the Transitions of Care (Post Inpatient/ED visit) call.   Alyse Low, RN, BA, [facility], CRRN [facility] Coordinator, Transition of Care Ph # [phone]

## 2023-09-05 ENCOUNTER — Telehealth: Payer: Self-pay

## 2023-09-05 NOTE — Transitions of Care (Post Inpatient/ED Visit) (Signed)
   09/05/2023  Name: Kathleen Rodriguez MRN: [id number] DOB: 1965-12-20  Today's TOC FU Call Status: Today's TOC FU Call Status:: Unsuccessful Call (2nd Attempt) Unsuccessful Call (2nd Attempt) Date: 09/05/23  Attempted to reach the patient regarding the most recent Inpatient/ED visit.  Follow Up Plan: Additional outreach attempts will be made to reach the patient to complete the Transitions of Care (Post Inpatient/ED visit) call.   Alyse Low, RN, BA, [facility], CRRN [facility] Coordinator, Transition of Care Ph # [phone]

## 2023-09-06 ENCOUNTER — Telehealth: Payer: Self-pay

## 2023-09-06 NOTE — Transitions of Care (Post Inpatient/ED Visit) (Signed)
   09/06/2023  Name: Kathleen Rodriguez MRN: [id number] DOB: July 08, 1966  Today's TOC FU Call Status:    Attempted to reach the patient regarding the most recent Inpatient/ED visit.  Follow Up Plan: No further outreach attempts will be made at this time. We have been unable to contact the patient.  Alyse Low, RN, BA, [facility], CRRN [facility] Coordinator, Transition of Care Ph # [phone]

## 2023-09-20 DIAGNOSIS — K219 Gastro-esophageal reflux disease without esophagitis: Secondary | ICD-10-CM | POA: Diagnosis not present

## 2023-09-20 DIAGNOSIS — Z7984 Long term (current) use of oral hypoglycemic drugs: Secondary | ICD-10-CM | POA: Diagnosis not present

## 2023-09-20 DIAGNOSIS — Z7952 Long term (current) use of systemic steroids: Secondary | ICD-10-CM | POA: Diagnosis not present

## 2023-09-20 DIAGNOSIS — Z87891 Personal history of nicotine dependence: Secondary | ICD-10-CM | POA: Diagnosis not present

## 2023-09-20 DIAGNOSIS — J8283 Eosinophilic asthma: Secondary | ICD-10-CM | POA: Diagnosis not present

## 2023-09-20 DIAGNOSIS — J9601 Acute respiratory failure with hypoxia: Secondary | ICD-10-CM | POA: Diagnosis not present

## 2023-09-20 DIAGNOSIS — R0602 Shortness of breath: Secondary | ICD-10-CM | POA: Diagnosis not present

## 2023-09-20 DIAGNOSIS — E119 Type 2 diabetes mellitus without complications: Secondary | ICD-10-CM | POA: Diagnosis not present

## 2023-09-20 DIAGNOSIS — R531 Weakness: Secondary | ICD-10-CM | POA: Diagnosis not present

## 2023-09-20 DIAGNOSIS — J441 Chronic obstructive pulmonary disease with (acute) exacerbation: Secondary | ICD-10-CM | POA: Diagnosis not present

## 2023-09-20 DIAGNOSIS — Z7951 Long term (current) use of inhaled steroids: Secondary | ICD-10-CM | POA: Diagnosis not present

## 2023-09-20 DIAGNOSIS — I10 Essential (primary) hypertension: Secondary | ICD-10-CM | POA: Diagnosis not present

## 2023-09-20 DIAGNOSIS — Z79899 Other long term (current) drug therapy: Secondary | ICD-10-CM | POA: Diagnosis not present

## 2023-09-20 DIAGNOSIS — G4733 Obstructive sleep apnea (adult) (pediatric): Secondary | ICD-10-CM | POA: Diagnosis not present

## 2023-09-20 DIAGNOSIS — J4551 Severe persistent asthma with (acute) exacerbation: Secondary | ICD-10-CM | POA: Diagnosis not present

## 2023-09-21 DIAGNOSIS — J9601 Acute respiratory failure with hypoxia: Secondary | ICD-10-CM | POA: Diagnosis not present

## 2023-09-21 DIAGNOSIS — Z720 Tobacco use: Secondary | ICD-10-CM | POA: Diagnosis not present

## 2023-09-21 DIAGNOSIS — J8283 Eosinophilic asthma: Secondary | ICD-10-CM | POA: Diagnosis not present

## 2023-09-21 DIAGNOSIS — J441 Chronic obstructive pulmonary disease with (acute) exacerbation: Secondary | ICD-10-CM | POA: Diagnosis not present

## 2023-09-22 DIAGNOSIS — J8283 Eosinophilic asthma: Secondary | ICD-10-CM | POA: Diagnosis not present

## 2023-09-22 DIAGNOSIS — J9601 Acute respiratory failure with hypoxia: Secondary | ICD-10-CM | POA: Diagnosis not present

## 2023-09-22 DIAGNOSIS — Z720 Tobacco use: Secondary | ICD-10-CM | POA: Diagnosis not present

## 2023-09-22 DIAGNOSIS — J441 Chronic obstructive pulmonary disease with (acute) exacerbation: Secondary | ICD-10-CM | POA: Diagnosis not present

## 2023-09-23 DIAGNOSIS — J441 Chronic obstructive pulmonary disease with (acute) exacerbation: Secondary | ICD-10-CM | POA: Diagnosis not present

## 2023-09-23 DIAGNOSIS — Z720 Tobacco use: Secondary | ICD-10-CM | POA: Diagnosis not present

## 2023-09-23 DIAGNOSIS — J9601 Acute respiratory failure with hypoxia: Secondary | ICD-10-CM | POA: Diagnosis not present

## 2023-09-23 DIAGNOSIS — J8283 Eosinophilic asthma: Secondary | ICD-10-CM | POA: Diagnosis not present

## 2023-09-24 DIAGNOSIS — J9601 Acute respiratory failure with hypoxia: Secondary | ICD-10-CM | POA: Diagnosis not present

## 2023-09-24 DIAGNOSIS — J441 Chronic obstructive pulmonary disease with (acute) exacerbation: Secondary | ICD-10-CM | POA: Diagnosis not present

## 2023-09-24 DIAGNOSIS — J8283 Eosinophilic asthma: Secondary | ICD-10-CM | POA: Diagnosis not present

## 2023-09-24 DIAGNOSIS — Z720 Tobacco use: Secondary | ICD-10-CM | POA: Diagnosis not present

## 2023-09-29 DIAGNOSIS — G629 Polyneuropathy, unspecified: Secondary | ICD-10-CM | POA: Diagnosis not present

## 2023-09-29 DIAGNOSIS — Z1159 Encounter for screening for other viral diseases: Secondary | ICD-10-CM | POA: Diagnosis not present

## 2023-09-29 DIAGNOSIS — E119 Type 2 diabetes mellitus without complications: Secondary | ICD-10-CM | POA: Diagnosis not present

## 2023-09-29 DIAGNOSIS — Z6832 Body mass index (BMI) 32.0-32.9, adult: Secondary | ICD-10-CM | POA: Diagnosis not present

## 2023-09-29 DIAGNOSIS — Z113 Encounter for screening for infections with a predominantly sexual mode of transmission: Secondary | ICD-10-CM | POA: Diagnosis not present

## 2023-10-13 DIAGNOSIS — R0689 Other abnormalities of breathing: Secondary | ICD-10-CM | POA: Diagnosis not present

## 2023-10-13 DIAGNOSIS — R0602 Shortness of breath: Secondary | ICD-10-CM | POA: Diagnosis not present

## 2023-10-13 DIAGNOSIS — R062 Wheezing: Secondary | ICD-10-CM | POA: Diagnosis not present

## 2023-10-13 DIAGNOSIS — R0902 Hypoxemia: Secondary | ICD-10-CM | POA: Diagnosis not present

## 2023-10-13 DIAGNOSIS — R000 Tachycardia, unspecified: Secondary | ICD-10-CM | POA: Diagnosis not present

## 2023-10-17 ENCOUNTER — Telehealth: Payer: Self-pay

## 2023-10-17 NOTE — Transitions of Care (Post Inpatient/ED Visit) (Signed)
   10/17/2023  Name: Kathleen Rodriguez MRN: [id number] DOB: June 16, 1966  Today's TOC FU Call Status: Today's TOC FU Call Status:: Unsuccessful Call (1st Attempt) Unsuccessful Call (1st Attempt) Date: 10/17/23  Attempted to reach the patient regarding the most recent Inpatient/ED visit.  Follow Up Plan: Additional outreach attempts will be made to reach the patient to complete the Transitions of Care (Post Inpatient/ED visit) call.   Susa Loffler , BSN, RN Care Management Coordinator [place]   [facility] [email or web address] Direct Dial: [phone]

## 2023-10-19 ENCOUNTER — Telehealth: Payer: Self-pay

## 2023-10-19 NOTE — Transitions of Care (Post Inpatient/ED Visit) (Signed)
   10/19/2023  Name: Kathleen Rodriguez MRN: [id number] DOB: 12-12-1965  Today's TOC FU Call Status: Today's TOC FU Call Status:: Unsuccessful Call (2nd Attempt) Unsuccessful Call (2nd Attempt) Date: 10/19/23  Attempted to reach the patient regarding the most recent Inpatient/ED visit.  Follow Up Plan: Additional outreach attempts will be made to reach the patient to complete the Transitions of Care (Post Inpatient/ED visit) call.   Alyse Low, RN, BA, [facility], CRRN [facility] Coordinator, Transition of Care Ph # [phone]

## 2023-10-20 ENCOUNTER — Telehealth: Payer: Self-pay

## 2023-10-20 NOTE — Transitions of Care (Post Inpatient/ED Visit) (Signed)
   10/20/2023  Name: JANNENE ZELAZNY MRN: [id number] DOB: July 07, 1966  Today's TOC FU Call Status: Today's TOC FU Call Status:: Unsuccessful Call (3rd Attempt) Unsuccessful Call (3rd Attempt) Date: 10/20/23  Attempted to reach the patient regarding the most recent Inpatient/ED visit.  Follow Up Plan: No further outreach attempts will be made at this time. We have been unable to contact the patient.  Alyse Low, RN, BA, [facility], CRRN [facility] Coordinator, Transition of Care Ph # [phone]

## 2023-11-01 DIAGNOSIS — L259 Unspecified contact dermatitis, unspecified cause: Secondary | ICD-10-CM | POA: Diagnosis not present

## 2023-11-01 DIAGNOSIS — J8283 Eosinophilic asthma: Secondary | ICD-10-CM | POA: Diagnosis not present

## 2023-11-01 DIAGNOSIS — Z7982 Long term (current) use of aspirin: Secondary | ICD-10-CM | POA: Diagnosis not present

## 2023-11-01 DIAGNOSIS — J4551 Severe persistent asthma with (acute) exacerbation: Secondary | ICD-10-CM | POA: Diagnosis not present

## 2023-11-01 DIAGNOSIS — Z7984 Long term (current) use of oral hypoglycemic drugs: Secondary | ICD-10-CM | POA: Diagnosis not present

## 2023-11-01 DIAGNOSIS — J45901 Unspecified asthma with (acute) exacerbation: Secondary | ICD-10-CM | POA: Diagnosis not present

## 2023-11-01 DIAGNOSIS — N760 Acute vaginitis: Secondary | ICD-10-CM | POA: Diagnosis not present

## 2023-11-01 DIAGNOSIS — R0689 Other abnormalities of breathing: Secondary | ICD-10-CM | POA: Diagnosis not present

## 2023-11-01 DIAGNOSIS — Z7951 Long term (current) use of inhaled steroids: Secondary | ICD-10-CM | POA: Diagnosis not present

## 2023-11-01 DIAGNOSIS — R0902 Hypoxemia: Secondary | ICD-10-CM | POA: Diagnosis not present

## 2023-11-01 DIAGNOSIS — E873 Alkalosis: Secondary | ICD-10-CM | POA: Diagnosis not present

## 2023-11-01 DIAGNOSIS — R000 Tachycardia, unspecified: Secondary | ICD-10-CM | POA: Diagnosis not present

## 2023-11-01 DIAGNOSIS — G4733 Obstructive sleep apnea (adult) (pediatric): Secondary | ICD-10-CM | POA: Diagnosis not present

## 2023-11-01 DIAGNOSIS — K219 Gastro-esophageal reflux disease without esophagitis: Secondary | ICD-10-CM | POA: Diagnosis not present

## 2023-11-01 DIAGNOSIS — Z5901 Sheltered homelessness: Secondary | ICD-10-CM | POA: Diagnosis not present

## 2023-11-01 DIAGNOSIS — N951 Menopausal and female climacteric states: Secondary | ICD-10-CM | POA: Diagnosis not present

## 2023-11-01 DIAGNOSIS — Z96652 Presence of left artificial knee joint: Secondary | ICD-10-CM | POA: Diagnosis not present

## 2023-11-01 DIAGNOSIS — Z1152 Encounter for screening for COVID-19: Secondary | ICD-10-CM | POA: Diagnosis not present

## 2023-11-01 DIAGNOSIS — R0600 Dyspnea, unspecified: Secondary | ICD-10-CM | POA: Diagnosis not present

## 2023-11-01 DIAGNOSIS — Z79899 Other long term (current) drug therapy: Secondary | ICD-10-CM | POA: Diagnosis not present

## 2023-11-01 DIAGNOSIS — J441 Chronic obstructive pulmonary disease with (acute) exacerbation: Secondary | ICD-10-CM | POA: Diagnosis not present

## 2023-11-01 DIAGNOSIS — R0602 Shortness of breath: Secondary | ICD-10-CM | POA: Diagnosis not present

## 2023-11-01 DIAGNOSIS — J9600 Acute respiratory failure, unspecified whether with hypoxia or hypercapnia: Secondary | ICD-10-CM | POA: Diagnosis not present

## 2023-11-01 DIAGNOSIS — E1165 Type 2 diabetes mellitus with hyperglycemia: Secondary | ICD-10-CM | POA: Diagnosis not present

## 2023-11-01 DIAGNOSIS — Z9851 Tubal ligation status: Secondary | ICD-10-CM | POA: Diagnosis not present

## 2023-11-01 DIAGNOSIS — R61 Generalized hyperhidrosis: Secondary | ICD-10-CM | POA: Diagnosis not present

## 2023-11-01 DIAGNOSIS — Z87891 Personal history of nicotine dependence: Secondary | ICD-10-CM | POA: Diagnosis not present

## 2023-11-01 DIAGNOSIS — Z7722 Contact with and (suspected) exposure to environmental tobacco smoke (acute) (chronic): Secondary | ICD-10-CM | POA: Diagnosis not present

## 2023-11-01 DIAGNOSIS — B372 Candidiasis of skin and nail: Secondary | ICD-10-CM | POA: Diagnosis not present

## 2023-11-01 DIAGNOSIS — I10 Essential (primary) hypertension: Secondary | ICD-10-CM | POA: Diagnosis not present

## 2023-11-02 DIAGNOSIS — J45901 Unspecified asthma with (acute) exacerbation: Secondary | ICD-10-CM | POA: Diagnosis not present

## 2023-11-02 DIAGNOSIS — J4489 Other specified chronic obstructive pulmonary disease: Secondary | ICD-10-CM | POA: Diagnosis not present

## 2023-11-02 DIAGNOSIS — I10 Essential (primary) hypertension: Secondary | ICD-10-CM | POA: Diagnosis not present

## 2023-11-02 DIAGNOSIS — J9601 Acute respiratory failure with hypoxia: Secondary | ICD-10-CM | POA: Diagnosis not present

## 2023-11-03 DIAGNOSIS — J9601 Acute respiratory failure with hypoxia: Secondary | ICD-10-CM | POA: Diagnosis not present

## 2023-11-03 DIAGNOSIS — J45901 Unspecified asthma with (acute) exacerbation: Secondary | ICD-10-CM | POA: Diagnosis not present

## 2023-11-03 DIAGNOSIS — I10 Essential (primary) hypertension: Secondary | ICD-10-CM | POA: Diagnosis not present

## 2023-11-03 DIAGNOSIS — J4489 Other specified chronic obstructive pulmonary disease: Secondary | ICD-10-CM | POA: Diagnosis not present

## 2023-11-13 DIAGNOSIS — J80 Acute respiratory distress syndrome: Secondary | ICD-10-CM | POA: Diagnosis not present

## 2023-11-13 DIAGNOSIS — R062 Wheezing: Secondary | ICD-10-CM | POA: Diagnosis not present

## 2023-11-13 DIAGNOSIS — I10 Essential (primary) hypertension: Secondary | ICD-10-CM | POA: Diagnosis not present

## 2023-11-13 DIAGNOSIS — E119 Type 2 diabetes mellitus without complications: Secondary | ICD-10-CM | POA: Diagnosis not present

## 2023-11-13 DIAGNOSIS — R0689 Other abnormalities of breathing: Secondary | ICD-10-CM | POA: Diagnosis not present

## 2023-11-13 DIAGNOSIS — R030 Elevated blood-pressure reading, without diagnosis of hypertension: Secondary | ICD-10-CM | POA: Diagnosis not present

## 2023-11-13 DIAGNOSIS — J441 Chronic obstructive pulmonary disease with (acute) exacerbation: Secondary | ICD-10-CM | POA: Diagnosis not present

## 2023-11-13 DIAGNOSIS — R0602 Shortness of breath: Secondary | ICD-10-CM | POA: Diagnosis not present

## 2023-11-14 DIAGNOSIS — K219 Gastro-esophageal reflux disease without esophagitis: Secondary | ICD-10-CM | POA: Diagnosis not present

## 2023-11-14 DIAGNOSIS — J8283 Eosinophilic asthma: Secondary | ICD-10-CM | POA: Diagnosis not present

## 2023-11-14 DIAGNOSIS — J9601 Acute respiratory failure with hypoxia: Secondary | ICD-10-CM | POA: Diagnosis not present

## 2023-11-14 DIAGNOSIS — J4551 Severe persistent asthma with (acute) exacerbation: Secondary | ICD-10-CM | POA: Diagnosis not present

## 2023-11-15 DIAGNOSIS — J441 Chronic obstructive pulmonary disease with (acute) exacerbation: Secondary | ICD-10-CM | POA: Diagnosis not present

## 2023-11-15 DIAGNOSIS — J4551 Severe persistent asthma with (acute) exacerbation: Secondary | ICD-10-CM | POA: Diagnosis not present

## 2023-11-15 DIAGNOSIS — J8283 Eosinophilic asthma: Secondary | ICD-10-CM | POA: Diagnosis not present

## 2023-11-15 DIAGNOSIS — J9601 Acute respiratory failure with hypoxia: Secondary | ICD-10-CM | POA: Diagnosis not present

## 2023-11-15 DIAGNOSIS — K219 Gastro-esophageal reflux disease without esophagitis: Secondary | ICD-10-CM | POA: Diagnosis not present

## 2023-11-16 DIAGNOSIS — J4551 Severe persistent asthma with (acute) exacerbation: Secondary | ICD-10-CM | POA: Diagnosis not present

## 2023-11-16 DIAGNOSIS — J9601 Acute respiratory failure with hypoxia: Secondary | ICD-10-CM | POA: Diagnosis not present

## 2023-11-16 DIAGNOSIS — K219 Gastro-esophageal reflux disease without esophagitis: Secondary | ICD-10-CM | POA: Diagnosis not present

## 2023-11-16 DIAGNOSIS — J8283 Eosinophilic asthma: Secondary | ICD-10-CM | POA: Diagnosis not present

## 2023-11-17 DIAGNOSIS — J9601 Acute respiratory failure with hypoxia: Secondary | ICD-10-CM | POA: Diagnosis not present

## 2023-11-17 DIAGNOSIS — K219 Gastro-esophageal reflux disease without esophagitis: Secondary | ICD-10-CM | POA: Diagnosis not present

## 2023-11-17 DIAGNOSIS — J4551 Severe persistent asthma with (acute) exacerbation: Secondary | ICD-10-CM | POA: Diagnosis not present

## 2023-11-17 DIAGNOSIS — J8283 Eosinophilic asthma: Secondary | ICD-10-CM | POA: Diagnosis not present

## 2023-11-19 DIAGNOSIS — R0602 Shortness of breath: Secondary | ICD-10-CM | POA: Diagnosis not present

## 2023-11-19 DIAGNOSIS — R0789 Other chest pain: Secondary | ICD-10-CM | POA: Diagnosis not present

## 2023-11-19 DIAGNOSIS — R531 Weakness: Secondary | ICD-10-CM | POA: Diagnosis not present

## 2023-11-19 DIAGNOSIS — R079 Chest pain, unspecified: Secondary | ICD-10-CM | POA: Diagnosis not present

## 2024-01-15 ENCOUNTER — Telehealth: Payer: Self-pay

## 2024-01-15 NOTE — Transitions of Care (Post Inpatient/ED Visit) (Signed)
   01/15/2024  Name: LASHAWNA POCHE MRN: [id number] DOB: [birth year]  Today's TOC FU Call Status: Today's TOC FU Call Status:: Unsuccessful Call (1st Attempt) Unsuccessful Call (1st Attempt) Date: 01/15/24  Attempted to reach the patient regarding the most recent Inpatient/ED visit.  Follow Up Plan: Additional outreach attempts will be made to reach the patient to complete the Transitions of Care (Post Inpatient/ED visit) call.   Tomiko Schoon A. Mliss Fritz RN, BA, [facility], CRRN [facility] Health RN Care Manager, Transition of Care [phone]

## 2024-01-16 ENCOUNTER — Telehealth: Payer: Self-pay

## 2024-01-16 NOTE — Transitions of Care (Post Inpatient/ED Visit) (Signed)
   01/16/2024  Name: Kathleen Rodriguez MRN: [id number] DOB: May 28, 1966  Today's TOC FU Call Status: Today's TOC FU Call Status:: Unsuccessful Call (2nd Attempt) Unsuccessful Call (2nd Attempt) Date: 01/16/24  Attempted to reach the patient regarding the most recent Inpatient/ED visit.  Follow Up Plan: Additional outreach attempts will be made to reach the patient to complete the Transitions of Care (Post Inpatient/ED visit) call.   Jordie Skalsky A. Mliss Fritz RN, BA, [facility], CRRN [facility] RN Care Manager, Transition of Care [phone]

## 2024-01-17 ENCOUNTER — Telehealth: Payer: Self-pay

## 2024-01-17 NOTE — Transitions of Care (Post Inpatient/ED Visit) (Signed)
   01/17/2024  Name: POLA FURNO MRN: [id number] DOB: 09-11-1966  Today's TOC FU Call Status: Today's TOC FU Call Status:: Unsuccessful Call (3rd Attempt) Unsuccessful Call (3rd Attempt) Date: 01/17/24  Attempted to reach the patient regarding the most recent Inpatient/ED visit.  Follow Up Plan: No further outreach attempts will be made at this time. We have been unable to contact the patient.  Koula Venier A. Mliss Fritz RN, BA, [facility], CRRN [facility] Health RN Care Manager, Transition of Care [phone]

## 2024-04-02 ENCOUNTER — Telehealth: Payer: Self-pay | Admitting: *Deleted

## 2024-04-02 NOTE — Transitions of Care (Post Inpatient/ED Visit) (Signed)
   04/02/2024  Name: Kathleen Rodriguez MRN: [id number] DOB: 02/22/1966  Today's TOC FU Call Status: Today's TOC FU Call Status:: Unsuccessful Call (1st Attempt) Unsuccessful Call (1st Attempt) Date: 04/02/24  Attempted to reach the patient regarding the most recent Inpatient/ED visit.  Follow Up Plan: Additional outreach attempts will be made to reach the patient to complete the Transitions of Care (Post Inpatient/ED visit) call.   Arna Better RN, BSN [place]  [facility] RN Care Manager [phone]

## 2024-04-03 ENCOUNTER — Telehealth: Payer: Self-pay | Admitting: *Deleted

## 2024-04-03 NOTE — Transitions of Care (Post Inpatient/ED Visit) (Signed)
   04/03/2024  Name: RUHANI UMLAND MRN: [id number] DOB: 18-Aug-1966  Today's TOC FU Call Status: Today's TOC FU Call Status:: Unsuccessful Call (2nd Attempt) Unsuccessful Call (2nd Attempt) Date: 04/03/24 (Late notification of discharge data received on 6.10.25)  Attempted to reach the patient regarding the most recent Inpatient/ED visit.  Follow Up Plan: Additional outreach attempts will be made to reach the patient to complete the Transitions of Care (Post Inpatient/ED visit) call.   Arna Better RN, BSN [place]  [facility] RN Care Manager [phone]

## 2024-04-04 ENCOUNTER — Telehealth: Payer: Self-pay | Admitting: *Deleted

## 2024-04-04 NOTE — Transitions of Care (Post Inpatient/ED Visit) (Signed)
   04/04/2024  Name: Kathleen Rodriguez MRN: [id number] DOB: October 20, 1966  Today's TOC FU Call Status: Today's TOC FU Call Status:: Unsuccessful Call (3rd Attempt) Unsuccessful Call (3rd Attempt) Date: 04/04/24 (Late notification of discharge data received on 6.10.25)  Attempted to reach the patient regarding the most recent Inpatient/ED visit.  Follow Up Plan: No further outreach attempts will be made at this time. We have been unable to contact the patient.  Arna Better RN, BSN [place]  [facility] Health RN Care Manager [phone]
